# Patient Record
Sex: Male | Born: 1945 | Race: White | Hispanic: No | Marital: Married | Smoking: Former smoker
Health system: Southern US, Community
[De-identification: ages and names within clinical notes are randomized; demographics above are authoritative.]

## PROBLEM LIST (undated history)

## (undated) ENCOUNTER — Emergency Department (HOSPITAL_COMMUNITY): Discharge status: Absent without leave | Payer: PPO

## (undated) DIAGNOSIS — Z720 Tobacco use: Secondary | ICD-10-CM

## (undated) DIAGNOSIS — K219 Gastro-esophageal reflux disease without esophagitis: Secondary | ICD-10-CM

## (undated) DIAGNOSIS — I10 Essential (primary) hypertension: Secondary | ICD-10-CM

## (undated) DIAGNOSIS — F329 Major depressive disorder, single episode, unspecified: Secondary | ICD-10-CM

## (undated) DIAGNOSIS — J449 Chronic obstructive pulmonary disease, unspecified: Secondary | ICD-10-CM

## (undated) DIAGNOSIS — F32A Depression, unspecified: Secondary | ICD-10-CM

## (undated) DIAGNOSIS — G8929 Other chronic pain: Secondary | ICD-10-CM

## (undated) DIAGNOSIS — F209 Schizophrenia, unspecified: Secondary | ICD-10-CM

## (undated) DIAGNOSIS — G4730 Sleep apnea, unspecified: Secondary | ICD-10-CM

## (undated) DIAGNOSIS — I480 Paroxysmal atrial fibrillation: Secondary | ICD-10-CM

## (undated) DIAGNOSIS — E119 Type 2 diabetes mellitus without complications: Secondary | ICD-10-CM

## (undated) DIAGNOSIS — I509 Heart failure, unspecified: Secondary | ICD-10-CM

## (undated) DIAGNOSIS — R0902 Hypoxemia: Secondary | ICD-10-CM

## (undated) DIAGNOSIS — I639 Cerebral infarction, unspecified: Secondary | ICD-10-CM

## (undated) DIAGNOSIS — M1990 Unspecified osteoarthritis, unspecified site: Secondary | ICD-10-CM

## (undated) DIAGNOSIS — Z9981 Dependence on supplemental oxygen: Secondary | ICD-10-CM

## (undated) DIAGNOSIS — E7800 Pure hypercholesterolemia, unspecified: Secondary | ICD-10-CM

## (undated) DIAGNOSIS — H5440 Blindness, one eye, unspecified eye: Secondary | ICD-10-CM

## (undated) HISTORY — PX: KNEE SURGERY: SHX244

## (undated) HISTORY — DX: Unspecified osteoarthritis, unspecified site: M19.90

## (undated) HISTORY — DX: Hypoxemia: R09.02

---

## 1998-06-08 ENCOUNTER — Inpatient Hospital Stay (HOSPITAL_COMMUNITY): Admission: EM | Admit: 1998-06-08 | Discharge: 1998-06-10 | Payer: Self-pay | Admitting: Emergency Medicine

## 1998-06-13 ENCOUNTER — Encounter (HOSPITAL_COMMUNITY): Admission: RE | Admit: 1998-06-13 | Discharge: 1998-09-11 | Payer: Self-pay

## 1998-12-29 ENCOUNTER — Emergency Department (HOSPITAL_COMMUNITY): Admission: EM | Admit: 1998-12-29 | Discharge: 1998-12-29 | Payer: Self-pay | Admitting: *Deleted

## 1999-01-27 ENCOUNTER — Encounter: Payer: Self-pay | Admitting: General Surgery

## 1999-01-27 ENCOUNTER — Inpatient Hospital Stay (HOSPITAL_COMMUNITY): Admission: EM | Admit: 1999-01-27 | Discharge: 1999-02-10 | Payer: Self-pay | Admitting: *Deleted

## 1999-01-28 ENCOUNTER — Encounter: Payer: Self-pay | Admitting: General Surgery

## 1999-01-29 ENCOUNTER — Encounter: Payer: Self-pay | Admitting: General Surgery

## 1999-01-31 ENCOUNTER — Encounter: Payer: Self-pay | Admitting: Surgery

## 1999-02-01 ENCOUNTER — Encounter: Payer: Self-pay | Admitting: General Surgery

## 1999-02-01 ENCOUNTER — Encounter: Payer: Self-pay | Admitting: Surgery

## 1999-02-02 ENCOUNTER — Encounter: Payer: Self-pay | Admitting: General Surgery

## 1999-02-05 ENCOUNTER — Encounter: Payer: Self-pay | Admitting: General Surgery

## 1999-02-07 ENCOUNTER — Encounter: Payer: Self-pay | Admitting: Internal Medicine

## 1999-02-07 ENCOUNTER — Encounter (HOSPITAL_BASED_OUTPATIENT_CLINIC_OR_DEPARTMENT_OTHER): Payer: Self-pay | Admitting: Internal Medicine

## 1999-02-09 ENCOUNTER — Encounter: Payer: Self-pay | Admitting: General Surgery

## 2000-11-22 ENCOUNTER — Emergency Department (HOSPITAL_COMMUNITY): Admission: EM | Admit: 2000-11-22 | Discharge: 2000-11-22 | Payer: Self-pay | Admitting: Internal Medicine

## 2005-09-17 ENCOUNTER — Emergency Department (HOSPITAL_COMMUNITY): Admission: EM | Admit: 2005-09-17 | Discharge: 2005-09-17 | Payer: Self-pay | Admitting: Emergency Medicine

## 2005-09-19 ENCOUNTER — Ambulatory Visit: Payer: Self-pay | Admitting: Orthopedic Surgery

## 2005-09-24 ENCOUNTER — Ambulatory Visit (HOSPITAL_COMMUNITY): Admission: RE | Admit: 2005-09-24 | Discharge: 2005-09-24 | Payer: Self-pay | Admitting: Orthopedic Surgery

## 2005-10-11 ENCOUNTER — Encounter: Payer: Self-pay | Admitting: Pulmonary Disease

## 2005-10-25 ENCOUNTER — Encounter: Payer: Self-pay | Admitting: Pulmonary Disease

## 2005-10-25 ENCOUNTER — Ambulatory Visit: Admission: RE | Admit: 2005-10-25 | Discharge: 2005-10-25 | Payer: Self-pay | Admitting: Internal Medicine

## 2005-11-12 ENCOUNTER — Ambulatory Visit: Payer: Self-pay | Admitting: Pulmonary Disease

## 2005-12-28 ENCOUNTER — Encounter (HOSPITAL_COMMUNITY): Admission: RE | Admit: 2005-12-28 | Discharge: 2006-01-27 | Payer: Self-pay | Admitting: Orthopaedic Surgery

## 2006-01-28 ENCOUNTER — Encounter (HOSPITAL_COMMUNITY): Admission: RE | Admit: 2006-01-28 | Discharge: 2006-02-27 | Payer: Self-pay | Admitting: Orthopaedic Surgery

## 2006-03-01 ENCOUNTER — Encounter (HOSPITAL_COMMUNITY): Admission: RE | Admit: 2006-03-01 | Discharge: 2006-03-31 | Payer: Self-pay | Admitting: Orthopaedic Surgery

## 2006-03-05 ENCOUNTER — Ambulatory Visit (HOSPITAL_COMMUNITY): Admission: RE | Admit: 2006-03-05 | Discharge: 2006-03-05 | Payer: Self-pay | Admitting: Orthopaedic Surgery

## 2006-03-26 ENCOUNTER — Ambulatory Visit (HOSPITAL_BASED_OUTPATIENT_CLINIC_OR_DEPARTMENT_OTHER): Admission: RE | Admit: 2006-03-26 | Discharge: 2006-03-26 | Payer: Self-pay | Admitting: Orthopaedic Surgery

## 2006-05-07 ENCOUNTER — Ambulatory Visit (HOSPITAL_BASED_OUTPATIENT_CLINIC_OR_DEPARTMENT_OTHER): Admission: RE | Admit: 2006-05-07 | Discharge: 2006-05-07 | Payer: Self-pay | Admitting: Orthopaedic Surgery

## 2006-06-12 ENCOUNTER — Encounter (HOSPITAL_COMMUNITY): Admission: RE | Admit: 2006-06-12 | Discharge: 2006-06-15 | Payer: Self-pay | Admitting: Orthopaedic Surgery

## 2006-06-17 ENCOUNTER — Encounter (HOSPITAL_COMMUNITY): Admission: RE | Admit: 2006-06-17 | Discharge: 2006-07-17 | Payer: Self-pay | Admitting: Orthopaedic Surgery

## 2006-07-18 ENCOUNTER — Encounter (HOSPITAL_COMMUNITY): Admission: RE | Admit: 2006-07-18 | Discharge: 2006-08-17 | Payer: Self-pay | Admitting: Orthopaedic Surgery

## 2006-08-22 ENCOUNTER — Encounter (HOSPITAL_COMMUNITY): Admission: RE | Admit: 2006-08-22 | Discharge: 2006-09-16 | Payer: Self-pay | Admitting: Orthopaedic Surgery

## 2006-09-18 ENCOUNTER — Encounter (HOSPITAL_COMMUNITY): Admission: RE | Admit: 2006-09-18 | Discharge: 2006-10-18 | Payer: Self-pay | Admitting: Orthopaedic Surgery

## 2008-08-25 DIAGNOSIS — I10 Essential (primary) hypertension: Secondary | ICD-10-CM | POA: Insufficient documentation

## 2008-08-25 DIAGNOSIS — G4733 Obstructive sleep apnea (adult) (pediatric): Secondary | ICD-10-CM

## 2008-08-25 DIAGNOSIS — E785 Hyperlipidemia, unspecified: Secondary | ICD-10-CM | POA: Insufficient documentation

## 2008-08-26 ENCOUNTER — Ambulatory Visit: Payer: Self-pay | Admitting: Pulmonary Disease

## 2008-08-26 DIAGNOSIS — R0600 Dyspnea, unspecified: Secondary | ICD-10-CM | POA: Insufficient documentation

## 2008-09-21 ENCOUNTER — Ambulatory Visit: Admission: RE | Admit: 2008-09-21 | Discharge: 2008-09-21 | Payer: Self-pay | Admitting: Surgery

## 2008-09-21 ENCOUNTER — Encounter: Payer: Self-pay | Admitting: Pulmonary Disease

## 2008-11-15 HISTORY — PX: COLON SURGERY: SHX602

## 2008-12-13 ENCOUNTER — Encounter (INDEPENDENT_AMBULATORY_CARE_PROVIDER_SITE_OTHER): Payer: Self-pay | Admitting: Surgery

## 2008-12-13 ENCOUNTER — Inpatient Hospital Stay (HOSPITAL_COMMUNITY): Admission: RE | Admit: 2008-12-13 | Discharge: 2008-12-19 | Payer: Self-pay | Admitting: Surgery

## 2010-03-17 HISTORY — PX: COLONOSCOPY: SHX174

## 2010-03-24 ENCOUNTER — Ambulatory Visit (HOSPITAL_COMMUNITY): Admission: RE | Admit: 2010-03-24 | Discharge: 2010-03-24 | Payer: Self-pay | Admitting: Gastroenterology

## 2010-12-27 LAB — DIFFERENTIAL
Eosinophils Absolute: 0.1 10*3/uL (ref 0.0–0.7)
Eosinophils Relative: 1 % (ref 0–5)
Lymphs Abs: 1.3 10*3/uL (ref 0.7–4.0)
Monocytes Relative: 8 % (ref 3–12)

## 2010-12-27 LAB — GLUCOSE, CAPILLARY
Glucose-Capillary: 101 mg/dL — ABNORMAL HIGH (ref 70–99)
Glucose-Capillary: 113 mg/dL — ABNORMAL HIGH (ref 70–99)
Glucose-Capillary: 117 mg/dL — ABNORMAL HIGH (ref 70–99)
Glucose-Capillary: 136 mg/dL — ABNORMAL HIGH (ref 70–99)
Glucose-Capillary: 152 mg/dL — ABNORMAL HIGH (ref 70–99)
Glucose-Capillary: 164 mg/dL — ABNORMAL HIGH (ref 70–99)
Glucose-Capillary: 184 mg/dL — ABNORMAL HIGH (ref 70–99)
Glucose-Capillary: 71 mg/dL (ref 70–99)
Glucose-Capillary: 75 mg/dL (ref 70–99)
Glucose-Capillary: 81 mg/dL (ref 70–99)

## 2010-12-27 LAB — CBC
HCT: 45.4 % (ref 39.0–52.0)
MCV: 92.2 fL (ref 78.0–100.0)
Platelets: 147 10*3/uL — ABNORMAL LOW (ref 150–400)
RBC: 4.93 MIL/uL (ref 4.22–5.81)
WBC: 13.3 10*3/uL — ABNORMAL HIGH (ref 4.0–10.5)

## 2010-12-28 LAB — CBC
HCT: 44.8 % (ref 39.0–52.0)
HCT: 47.7 % (ref 39.0–52.0)
Hemoglobin: 15.3 g/dL (ref 13.0–17.0)
MCV: 93.5 fL (ref 78.0–100.0)
MCV: 93.9 fL (ref 78.0–100.0)
Platelets: 140 10*3/uL — ABNORMAL LOW (ref 150–400)
RDW: 13.4 % (ref 11.5–15.5)
RDW: 13.6 % (ref 11.5–15.5)
RDW: 13.7 % (ref 11.5–15.5)
WBC: 10.4 10*3/uL (ref 4.0–10.5)
WBC: 11.9 10*3/uL — ABNORMAL HIGH (ref 4.0–10.5)

## 2010-12-28 LAB — COMPREHENSIVE METABOLIC PANEL
ALT: 25 U/L (ref 0–53)
Albumin: 4 g/dL (ref 3.5–5.2)
Alkaline Phosphatase: 63 U/L (ref 39–117)
BUN: 9 mg/dL (ref 6–23)
Chloride: 100 mEq/L (ref 96–112)
Glucose, Bld: 94 mg/dL (ref 70–99)
Potassium: 4.8 mEq/L (ref 3.5–5.1)
Sodium: 139 mEq/L (ref 135–145)
Total Bilirubin: 0.5 mg/dL (ref 0.3–1.2)
Total Protein: 7 g/dL (ref 6.0–8.3)

## 2010-12-28 LAB — GLUCOSE, CAPILLARY
Glucose-Capillary: 122 mg/dL — ABNORMAL HIGH (ref 70–99)
Glucose-Capillary: 129 mg/dL — ABNORMAL HIGH (ref 70–99)
Glucose-Capillary: 131 mg/dL — ABNORMAL HIGH (ref 70–99)
Glucose-Capillary: 143 mg/dL — ABNORMAL HIGH (ref 70–99)
Glucose-Capillary: 145 mg/dL — ABNORMAL HIGH (ref 70–99)
Glucose-Capillary: 150 mg/dL — ABNORMAL HIGH (ref 70–99)
Glucose-Capillary: 160 mg/dL — ABNORMAL HIGH (ref 70–99)
Glucose-Capillary: 162 mg/dL — ABNORMAL HIGH (ref 70–99)
Glucose-Capillary: 168 mg/dL — ABNORMAL HIGH (ref 70–99)
Glucose-Capillary: 172 mg/dL — ABNORMAL HIGH (ref 70–99)

## 2010-12-28 LAB — DIFFERENTIAL
Basophils Absolute: 0 10*3/uL (ref 0.0–0.1)
Basophils Absolute: 0 10*3/uL (ref 0.0–0.1)
Basophils Absolute: 0.1 10*3/uL (ref 0.0–0.1)
Basophils Relative: 0 % (ref 0–1)
Eosinophils Absolute: 0 10*3/uL (ref 0.0–0.7)
Eosinophils Absolute: 0 10*3/uL (ref 0.0–0.7)
Eosinophils Absolute: 0.2 10*3/uL (ref 0.0–0.7)
Lymphocytes Relative: 8 % — ABNORMAL LOW (ref 12–46)
Lymphs Abs: 0.8 10*3/uL (ref 0.7–4.0)
Lymphs Abs: 0.9 10*3/uL (ref 0.7–4.0)
Monocytes Absolute: 0.5 10*3/uL (ref 0.1–1.0)
Monocytes Absolute: 0.8 10*3/uL (ref 0.1–1.0)
Monocytes Relative: 5 % (ref 3–12)
Neutrophils Relative %: 78 % — ABNORMAL HIGH (ref 43–77)
Neutrophils Relative %: 84 % — ABNORMAL HIGH (ref 43–77)

## 2010-12-28 LAB — BLOOD GAS, ARTERIAL
Acid-Base Excess: 4.6 mmol/L — ABNORMAL HIGH (ref 0.0–2.0)
Bicarbonate: 29.8 mEq/L — ABNORMAL HIGH (ref 20.0–24.0)
O2 Saturation: 92.4 %
Patient temperature: 98.6
TCO2: 25.8 mmol/L (ref 0–100)

## 2010-12-28 LAB — BASIC METABOLIC PANEL
BUN: 2 mg/dL — ABNORMAL LOW (ref 6–23)
BUN: 4 mg/dL — ABNORMAL LOW (ref 6–23)
GFR calc non Af Amer: >60 mL/min (ref >60)
GFR calc non Af Amer: >60 mL/min (ref >60)
Glucose, Bld: 158 mg/dL — ABNORMAL HIGH (ref 70–99)
Glucose, Bld: 185 mg/dL — ABNORMAL HIGH (ref 70–99)
Potassium: 5.1 mEq/L (ref 3.5–5.1)

## 2011-01-01 LAB — BLOOD GAS, ARTERIAL
Acid-Base Excess: 2.4 mmol/L — ABNORMAL HIGH (ref 0.0–2.0)
Bicarbonate: 27.7 mEq/L — ABNORMAL HIGH (ref 20.0–24.0)
O2 Saturation: 89.5 %
Patient temperature: 98.6
TCO2: 23.9 mmol/L (ref 0–100)
pO2, Arterial: 55.4 mmHg — ABNORMAL LOW (ref 80.0–100.0)

## 2011-01-01 LAB — DIFFERENTIAL
Basophils Relative: 0 % (ref 0–1)
Eosinophils Absolute: 0.1 10*3/uL (ref 0.0–0.7)
Lymphs Abs: 1.7 10*3/uL (ref 0.7–4.0)
Monocytes Relative: 5 % (ref 3–12)
Neutro Abs: 9.8 10*3/uL — ABNORMAL HIGH (ref 1.7–7.7)
Neutrophils Relative %: 80 % — ABNORMAL HIGH (ref 43–77)

## 2011-01-01 LAB — COMPREHENSIVE METABOLIC PANEL
ALT: 28 U/L (ref 0–53)
BUN: 12 mg/dL (ref 6–23)
CO2: 27 mEq/L (ref 19–32)
Calcium: 8.8 mg/dL (ref 8.4–10.5)
GFR calc non Af Amer: >60 mL/min (ref >60)
Glucose, Bld: 444 mg/dL — ABNORMAL HIGH (ref 70–99)
Total Protein: 6.6 g/dL (ref 6.0–8.3)

## 2011-01-01 LAB — CBC
HCT: 49.1 % (ref 39.0–52.0)
Hemoglobin: 16.2 g/dL (ref 13.0–17.0)
MCHC: 32.9 g/dL (ref 30.0–36.0)
MCV: 89.7 fL (ref 78.0–100.0)
RBC: 5.47 MIL/uL (ref 4.22–5.81)
RDW: 12.6 % (ref 11.5–15.5)

## 2011-01-30 NOTE — Op Note (Signed)
NAMEBYRANT, CHAIRS                ACCOUNT NO.:  [id number]   MEDICAL RECORD NO.:  [id number]          PATIENT TYPE:  INP   LOCATION:  0003                         FACILITY:  [facility]   PHYSICIAN:  Fenton Malling. Lucia Gaskins, M.D.  DATE OF BIRTH:  [birth year]   DATE OF PROCEDURE:  12/13/2008  DATE OF DISCHARGE:                               OPERATIVE REPORT   Date of Surgery ?   PREOPERATIVE DIAGNOSIS:  Right colon polyp.   POSTOPERATIVE DIAGNOSIS:  Right colon polyp approximately 1 x 3 cm in  size.   PROCEDURE:  Lap assisted right hemicolectomy.   SURGEON:  Fenton Malling. Lucia Gaskins, M.D.   FIRST ASSISTANT:  Orson Ape. Rise Patience, M.D.   ANESTHESIA:  General endotracheal.   ESTIMATED BLOOD LOSS:  Less than 150 mL.   INDICATIONS FOR PROCEDURE:  Mr. Svay is a 65 year old white male who is  a patient of Dr. [name].  He underwent a colonoscopy by Dr. Jim Desanctis in October 2009 was found to have a right colon polyp which Dr. Lajoyce Corners  was able to remove.   I discussed with him about proceeding with surgery for resection of this  polyp.  He sees Dr. Danton Sewer from a pulmonary standpoint. His  original surgery was originally delayed until he was placed on insulin  for controlling his diabetes mellitus.   The indications, potential complications of right hemicolectomy were  explained to the patient.  Potential complications include, but not  limited to, bleeding, infection, leakage from the bowel and the  possibility this could be malignant (though by Dr. Shan Levans evaluation,  this appears to be a benign polyp0.   OPERATIVE NOTE:  The patient was placed in supine position, given a  general endotracheal anesthetic.  His right side was marked.  He was  prepped with Betadine solution and sterilely draped.  He was given 1 g  of cefoxitin at initiation of his procedure.  A time-out was held  identifying the patient and procedure.   I accessed the abdominal cavity to the left upper quadrant with a 10-mm  OptiVu trocar.  I placed 4 additional 5 mm trocars, one left lower  quadrant, one right lower quadrant, one sort of right upper midline and  one to the left of his umbilicus.  I was able to identify the right  colon.  I grabbed the appendix, took down the mesentery of the appendix.  With a LigaSure I was able to mobilize the colon by dividing its lateral  attachments and mobilizing the colon towards the midline.  I made the  turn at the hepatic flexure, identified the gallbladder, identified the  duodenum and stayed well away from these during the dissection.  I was  able to mobilize his colon well enough to a point that I thought I could  do the surgery open.   I was able to identify an area in his right upper abdomen and I made a  transverse incision and got into the abdominal cavity.  I was able to  grab his terminal ileum and go up to his right transverse  colon.  I took  down the mesentery, used primarily 2-0 silk ties and then did place a  firing of the GIA 75 stapler connecting the terminal ileum to the  hepatic flexure, right transverse colon and then I teed this off with a  TA 60 stapler.  I cut the specimen and examined it.  I opened this as  the bedside.  She did have approximately a 1 x 3 benign appearing polyp  immediately proximal to the ileocecal valve.  I saw no polyp or growth  with the specimen.   This was sent to pathology.   I then reinspected the anastomosis.  I tried to close the crotch of the  staple line with two 2-0 silk sutures.  I oversewed the TA 60 staple  line with an interrupted figure-of-eight 2-0 silk suture.  I tried to  close the mesentery with 2-0 silk sutures.  I then irrigated the abdomen  with 2 L of saline.  I then closed the posterior fascia with a running 2  running #1 PDS sutures.  I infiltrated about 30 mL of 0.25% Marcaine in  the rectus abdominis muscles and subcutaneous tissues.  I then closed  the anterior rectus fascia with two running #1  PDS sutures.  I irrigated  the subcutaneous tissues, closed the skin was staples.   The patient tolerated the procedure well.  Because this was the right  colon, I removed the orogastric tube.  I will leave a Foley in overnight  which I plan to get out tomorrow morning.  He otherwise did well from a  cardiac standpoint.   His sponge and needle counts were correct at the end of the case.  He  was transferred to recovery room in good condition.  Because of his  multiple medical problems, I will place him in the step down ICU.      Fenton Malling. Lucia Gaskins, M.D.  Electronically Signed     DHN/MEDQ  D:  12/13/2008  T:  12/13/2008  Job:  [id number]   cc:   Kathee Delton, MD,FCCP  [address]  [place]  [place] [address]   Arlyss Repress, MD  Fax: [phone]   Waverly Ferrari, M.D.  Fax: [phone]

## 2011-02-02 NOTE — Discharge Summary (Signed)
Rick Cruz, Rick Cruz                ACCOUNT NO.:  [id number]   MEDICAL RECORD NO.:  [id number]          PATIENT TYPE:  INP   LOCATION:  [place]                         FACILITY:  [facility]   PHYSICIAN:  Fenton Malling. Lucia Gaskins, M.D.  DATE OF BIRTH:  1946-03-28   DATE OF ADMISSION:  12/13/2008  DATE OF DISCHARGE:  12/19/2008                               DISCHARGE SUMMARY   Dates of admission - 13 December 2008  Date of discharge - 19 December 2008   DISCHARGE DIAGNOSES:  1. 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  2. Insulin dependent diabetes mellitus.  3. History of cigarette smoking.  4. Morbid obesity.  5. Hypertension.  6. Hypercholesterolemia.  7. Chronic obstructive pulmonary disease.  8. Obstructive sleep apnea.   OPERATION PERFORMED:  The patient had a laparoscopic assisted right  hemicolectomy on December 13, 2008.   HISTORY OF PRESENT ILLNESS:  Rick Cruz is a 65 year old white male  patient of Dr. [name] who had a colonoscopy by Dr. Jim Desanctis on  June 30, 2008.  He was found to have a large right colon/cecal polyp  which was not amenable to colonoscopic resection and has seen me for  resection of this.   The patient completed a mechanical bowel prep at home and came to [facility]  for his colon resection.   On December 13, 2008, the patient underwent a laparoscopic assisted right  hemicolectomy.   Postoperatively he did well.  I did put him in the ICU for a couple of  night because he has diabetes and obstructive sleep apnea.  He also had  some mild confusion postop.   However, by the fourth postoperative day he was getting slowly better.  His hemoglobin was 14, hematocrit was 45, white blood count 13,800.  He  was started on clear liquids with his diet advanced.  His diabetes was  stable.  He seemed to be tolerating CPAP well.   I opened his wound which had a seroma.   On April 4 which was his sixth postoperative day he was ready for  discharge.   DISCHARGE  INSTRUCTIONS: He was told to resume his home medications.  He was given Vicodin for pain.  He was to do local wound changes twice a  day.  He will see me back in the office in 1 week's time for followup care and  evaluation.  His final pathology read by Dr. Rodney Cruise showed right colon terminal ileum  with a sessile tubovillous adenoma which had no evidence of malignancy  or invasion.  His discharge condition was good.      Fenton Malling. Lucia Gaskins, M.D.  Electronically Signed     DHN/MEDQ  D:  01/05/2009  T:  01/05/2009  Job:  [id number]   cc:   Waverly Ferrari, M.D.  Fax: [place], MD,FCCP  [address] [address]   James Y [name], MD  Fax: [phone]

## 2011-02-02 NOTE — Op Note (Signed)
NAMESHERWIN, ECKRICH                ACCOUNT NO.:  [id number]   MEDICAL RECORD NO.:  [id number]          PATIENT TYPE:  AMB   LOCATION:  DSC                          FACILITY:  [facility]   PHYSICIAN:  Monico Blitz. Dalldorf, M.D.DATE OF BIRTH:  Dec 16, 1945   DATE OF PROCEDURE:  05/07/2006  DATE OF DISCHARGE:                                 OPERATIVE REPORT   PREOPERATIVE DIAGNOSES:  Left quadriceps repeat rupture.   POSTOPERATIVE DIAGNOSES:  Left quadriceps repeat rupture.   PROCEDURE:  Left quadriceps repeat repair.   ANESTHESIA:  General.   ATTENDING SURGEON:  Dr. Melrose Nakayama.   ASSISTANT:  Roselee Nova, P.A.   INDICATIONS FOR PROCEDURE:  The patient is a 65 year old man who ruptured  his quadriceps tendon about 9 months ago.  He underwent a delayed repair  about 5 months ago and initially did very well.  At about the 31-month mark  in therapy, he noticed some increased pain and at office follow-up  subsequently he was noted to have a defect and had lost his ability to  extend the leg completely.  About a month later, we got a repeat MRI scan  which showed rerupture with retraction of the quadriceps tendon.  At this  point, he is offered one more chance at a repeat repair.  Our plan is to go  ahead and repair this in a similar fashion but to augment with a Restore  patch.  This is in hopes of reestablishing the continuity of his extensor  mechanism.  Informed operative consent was obtained after discussion of the  possible complications of reaction to anesthesia, infection, DVT, knee  stiffness, and obviously rupture.   SUMMARY OF FINDINGS AND PROCEDURE:  Under general anesthesia through his old  incision, a repeat rupture of the quadriceps tendon was visualized.  The  defect had filled in with some very thin scar tissue but there was certainly  no continuity to his extensor mechanism.  He had significant adhesions all  across the quadriceps and patellar tendon.  These were all  lysed and things  were freed up.  We then repaired this with four limbs of Bunnell suture  which were passed through the quadriceps tendon and then through  longitudinal drill holes in the patella.  I then oversewed the retinacular  layers on either aspect of this repair with #1 Vicryl in interrupted  fashion.  We augmented the repair with a Restore patch by DePuy which was  sutured in place along the line of the repair over the superior pole of the  patella.  Chauncey Cruel assisted throughout and was invaluable to the  completion of this case in that he retracted and helped position so I could  perform the repair.   DESCRIPTION OF PROCEDURE:  The patient was taken to the operating suite  where general anesthetic was applied without difficulty.  He was positioned  supine and prepped and draped in the normal sterile fashion.  After the  administration of IV Kefzol, the left leg was elevated, exsanguinated, and a  tourniquet inflated about the thigh.  His old longitudinal  incision was made  with dissection down to the quadriceps and extensor mechanism.  As mentioned  above, he had a complete repeat rupture with some interposed scar tissue.  All this devitalized scar tissue was excised to viable tissues.  The  superior pole of the patella was completely cleaned of scar tissue and a  small trough was made in the bone with a rongeur.  I then used a #2  FiberWire and passed this in Lipan fashion through the quadriceps tendon  proximally with two limbs exiting distally.  I did this with a second #2  FiberWire as well.  We then made three longitudinal drill holes in the  patella and passed the aforementioned sutures through these drill holes.  We  then tied these sutures on the inferior pole of the patella and were able to  secure the central portion the quadriceps tendon to the proximal pole of the  patella in this fashion.  I then performed retinacular repairs on both sides  excising some  devitalized tissue once again prior to the repair which was  done with #1 Vicryl in interrupted fashion.  I then took a Restore patch and  soaked it for 15 or 20 minutes in saline as directed and then applied this  with some mild tension over the repair site.  This was secured to the  underlying tissues with 2-0 Vicryl in interrupted fashion.  The tourniquet  was deflated and a small amount of bleeding was easily controlled with Bovie  cautery.  We then reapproximated the deep tissues with #0 and 2-0 undyed  Vicryl and skin was closed with staples.  Some Marcaine was injected about  the incision site.  Adaptic was applied followed by a dry gauze and a loose  Ace wrap with a knee immobilizer with the knee in full extension.  Estimated  blood loss and intraoperative fluids can be obtained from anesthesia records  as can accurate tourniquet time.   DISPOSITION:  The patient was extubated in the operating room and taken to  the recovery room in stable condition. Plans were for him to go home the  same-day and to followup in the office in less than one week .  I will  contact him by phone tonight.      Monico Blitz Rhona Raider, M.D.  Electronically Signed     PGD/MEDQ  D:  05/07/2006  T:  05/08/2006  Job:  [id number]

## 2011-11-13 ENCOUNTER — Ambulatory Visit: Payer: Medicare Other | Attending: Internal Medicine | Admitting: Sleep Medicine

## 2011-11-13 DIAGNOSIS — G4700 Insomnia, unspecified: Secondary | ICD-10-CM

## 2011-11-13 DIAGNOSIS — G4733 Obstructive sleep apnea (adult) (pediatric): Secondary | ICD-10-CM | POA: Insufficient documentation

## 2011-11-14 NOTE — Progress Notes (Signed)
Pt stated he self administered at 2135: Vicodin 5-500 mg two tablets and two 150 mg tablets of Trazadone

## 2011-11-19 NOTE — Procedures (Signed)
Rick Cruz, Rick Cruz                ACCOUNT NO.:  [id number]  MEDICAL RECORD NO.:  [id number]          PATIENT TYPE:  OUT  LOCATION:  SLEEP LAB                     FACILITY:  [facility]  PHYSICIAN:  Infant Zink A. Merlene Laughter, M.D. DATE OF BIRTH:  Jan 24, 1946  DATE OF STUDY:  11/13/2011                           NOCTURNAL POLYSOMNOGRAM  REFERRING PHYSICIAN:  Arlyss Repress, MD  INDICATIONS:  A 66 year old man who has a known diagnosis of obstructive sleep apnea syndrome.  From a sleep study done in 2005, he presents with recurrent symptoms of fatigue and snoring.  MEDICATIONS:  Abilify, Lasix, simvastatin, aspirin, quinapril, glipizide, metformin, potassium, trazodone, Vicodin, metoprolol, fish oil, multivitamin, insulin, Combivent.  Epworth Sleepiness Scale is 6.  BMI 38.  ARCHITECTURAL SUMMARY:  This is a full night CPAP for positive pressure titration recording.  The total recording time of 428 minutes.  Sleep efficiency 77%.  Sleep latency 21 minutes.  REM latency 135.5 minutes. Stage N1 8.2%, N2 58%, N3 1%, and REM sleep 33%.  Risk factors; baseline oxygen saturation was low at 84, lowest saturation went down to 75. Even while patient was on 4 L of oxygen during the REM sleep.  The patient was placed on positive pressure starting at 6 and various different maneuvers were tried.  Unfortunately, the patient's low baseline persisted.  He was titrated up to a CPAP of 10 with oxygen gradually added up to 4. He was also started on bilevel pressures between 12/6 to 21/14; again, however, he still had significant low baseline, still requiring nocturnal oxygen.  The desaturations were worse during the REM sleep.  Patient could do well with various pressures as long as he is on 4 L of oxygen.  LIMB MOVEMENT SUMMARY:  PLM index 0.  ELECTROCARDIOGRAM SUMMARY:  Average heart rate is 91, with no significant dysrhythmias observed.  IMPRESSION: 1. Obstructive sleep apnea syndrome, which responds well to  various     positive pressure treatment.  Did a little better on bilevel     pressures, but requiring nocturnal oxygen. 2. Hypoventilation syndrome.  RECOMMENDATIONS:  I would recommend a bilevel of 13/7 with 4 L of oxygen bled in.  I also suggest that the patient undergo a nocturnal oximetry about a month after he has been on treatment, and this oximetry should also be done while he is on treatment.   Kahlea Cobert A. Merlene Laughter, M.D.    KAD/MEDQ  D:  11/19/2011 08:45:54  T:  11/19/2011 09:12:36  Job:  [id number]

## 2013-04-18 ENCOUNTER — Emergency Department (HOSPITAL_COMMUNITY): Payer: Medicare Other

## 2013-04-18 ENCOUNTER — Encounter (HOSPITAL_COMMUNITY): Payer: Self-pay | Admitting: *Deleted

## 2013-04-18 ENCOUNTER — Inpatient Hospital Stay (HOSPITAL_COMMUNITY)
Admission: EM | Admit: 2013-04-18 | Discharge: 2013-04-23 | DRG: 189 | Disposition: A | Discharge status: Home Health | Payer: Medicare Other | Attending: Family Medicine | Admitting: Family Medicine

## 2013-04-18 DIAGNOSIS — F3289 Other specified depressive episodes: Secondary | ICD-10-CM | POA: Diagnosis present

## 2013-04-18 DIAGNOSIS — E7800 Pure hypercholesterolemia, unspecified: Secondary | ICD-10-CM | POA: Diagnosis present

## 2013-04-18 DIAGNOSIS — I509 Heart failure, unspecified: Secondary | ICD-10-CM | POA: Diagnosis present

## 2013-04-18 DIAGNOSIS — K2100 Gastro-esophageal reflux disease with esophagitis, without bleeding: Secondary | ICD-10-CM | POA: Diagnosis present

## 2013-04-18 DIAGNOSIS — Z9981 Dependence on supplemental oxygen: Secondary | ICD-10-CM

## 2013-04-18 DIAGNOSIS — E871 Hypo-osmolality and hyponatremia: Secondary | ICD-10-CM | POA: Diagnosis not present

## 2013-04-18 DIAGNOSIS — K208 Other esophagitis without bleeding: Secondary | ICD-10-CM | POA: Diagnosis present

## 2013-04-18 DIAGNOSIS — Z6841 Body Mass Index (BMI) 40.0 and over, adult: Secondary | ICD-10-CM

## 2013-04-18 DIAGNOSIS — G4733 Obstructive sleep apnea (adult) (pediatric): Secondary | ICD-10-CM

## 2013-04-18 DIAGNOSIS — Z91199 Patient's noncompliance with other medical treatment and regimen due to unspecified reason: Secondary | ICD-10-CM

## 2013-04-18 DIAGNOSIS — K922 Gastrointestinal hemorrhage, unspecified: Secondary | ICD-10-CM | POA: Diagnosis present

## 2013-04-18 DIAGNOSIS — Z79899 Other long term (current) drug therapy: Secondary | ICD-10-CM

## 2013-04-18 DIAGNOSIS — IMO0001 Reserved for inherently not codable concepts without codable children: Secondary | ICD-10-CM

## 2013-04-18 DIAGNOSIS — E785 Hyperlipidemia, unspecified: Secondary | ICD-10-CM

## 2013-04-18 DIAGNOSIS — E875 Hyperkalemia: Secondary | ICD-10-CM

## 2013-04-18 DIAGNOSIS — F200 Paranoid schizophrenia: Secondary | ICD-10-CM

## 2013-04-18 DIAGNOSIS — J9610 Chronic respiratory failure, unspecified whether with hypoxia or hypercapnia: Secondary | ICD-10-CM

## 2013-04-18 DIAGNOSIS — Z9119 Patient's noncompliance with other medical treatment and regimen: Secondary | ICD-10-CM

## 2013-04-18 DIAGNOSIS — I2510 Atherosclerotic heart disease of native coronary artery without angina pectoris: Secondary | ICD-10-CM

## 2013-04-18 DIAGNOSIS — F329 Major depressive disorder, single episode, unspecified: Secondary | ICD-10-CM | POA: Diagnosis present

## 2013-04-18 DIAGNOSIS — Z794 Long term (current) use of insulin: Secondary | ICD-10-CM

## 2013-04-18 DIAGNOSIS — J9620 Acute and chronic respiratory failure, unspecified whether with hypoxia or hypercapnia: Principal | ICD-10-CM | POA: Diagnosis present

## 2013-04-18 DIAGNOSIS — E119 Type 2 diabetes mellitus without complications: Secondary | ICD-10-CM

## 2013-04-18 DIAGNOSIS — J9621 Acute and chronic respiratory failure with hypoxia: Secondary | ICD-10-CM

## 2013-04-18 DIAGNOSIS — F17200 Nicotine dependence, unspecified, uncomplicated: Secondary | ICD-10-CM | POA: Diagnosis present

## 2013-04-18 DIAGNOSIS — I10 Essential (primary) hypertension: Secondary | ICD-10-CM

## 2013-04-18 DIAGNOSIS — K920 Hematemesis: Secondary | ICD-10-CM

## 2013-04-18 DIAGNOSIS — J441 Chronic obstructive pulmonary disease with (acute) exacerbation: Secondary | ICD-10-CM

## 2013-04-18 DIAGNOSIS — K2210 Ulcer of esophagus without bleeding: Secondary | ICD-10-CM

## 2013-04-18 HISTORY — DX: Chronic obstructive pulmonary disease, unspecified: J44.9

## 2013-04-18 HISTORY — DX: Sleep apnea, unspecified: G47.30

## 2013-04-18 HISTORY — DX: Major depressive disorder, single episode, unspecified: F32.9

## 2013-04-18 HISTORY — DX: Depression, unspecified: F32.A

## 2013-04-18 HISTORY — DX: Tobacco use: Z72.0

## 2013-04-18 HISTORY — DX: Schizophrenia, unspecified: F20.9

## 2013-04-18 HISTORY — DX: Type 2 diabetes mellitus without complications: E11.9

## 2013-04-18 HISTORY — DX: Dependence on supplemental oxygen: Z99.81

## 2013-04-18 HISTORY — DX: Heart failure, unspecified: I50.9

## 2013-04-18 HISTORY — DX: Pure hypercholesterolemia, unspecified: E78.00

## 2013-04-18 LAB — RAPID URINE DRUG SCREEN, HOSP PERFORMED
Amphetamines: NOT DETECTED
Cocaine: NOT DETECTED
Opiates: NOT DETECTED

## 2013-04-18 LAB — CBC WITH DIFFERENTIAL/PLATELET
Basophils Absolute: 0 10*3/uL (ref 0.0–0.1)
Basophils Relative: 0 % (ref 0–1)
HCT: 44.3 % (ref 39.0–52.0)
Lymphocytes Relative: 15 % (ref 12–46)
MCHC: 31.2 g/dL (ref 30.0–36.0)
Neutro Abs: 11.4 10*3/uL — ABNORMAL HIGH (ref 1.7–7.7)
Neutrophils Relative %: 77 % (ref 43–77)
Platelets: 187 10*3/uL (ref 150–400)
RDW: 12.2 % (ref 11.5–15.5)
WBC: 14.7 10*3/uL — ABNORMAL HIGH (ref 4.0–10.5)

## 2013-04-18 LAB — URINALYSIS, ROUTINE W REFLEX MICROSCOPIC
Bilirubin Urine: NEGATIVE
Hgb urine dipstick: NEGATIVE
Nitrite: NEGATIVE
Specific Gravity, Urine: 1.015 (ref 1.005–1.030)
Urobilinogen, UA: 0.2 mg/dL (ref 0.0–1.0)
pH: 6 (ref 5.0–8.0)

## 2013-04-18 LAB — COMPREHENSIVE METABOLIC PANEL
ALT: 21 U/L (ref 0–53)
AST: 14 U/L (ref 0–37)
Albumin: 3.6 g/dL (ref 3.5–5.2)
Alkaline Phosphatase: 83 U/L (ref 39–117)
Calcium: 9.5 mg/dL (ref 8.4–10.5)
Creatinine, Ser: 0.87 mg/dL (ref 0.50–1.35)
GFR calc Af Amer: >90 mL/min (ref >90)
Glucose, Bld: 194 mg/dL — ABNORMAL HIGH (ref 70–99)
Potassium: 4.5 mEq/L (ref 3.5–5.1)
Sodium: 133 mEq/L — ABNORMAL LOW (ref 135–145)
Total Protein: 7.1 g/dL (ref 6.0–8.3)

## 2013-04-18 LAB — PROTIME-INR: Prothrombin Time: 12.4 seconds (ref 11.6–15.2)

## 2013-04-18 LAB — TROPONIN I: Troponin I: <0.3 ng/mL (ref <0.30)

## 2013-04-18 LAB — PRO B NATRIURETIC PEPTIDE: Pro B Natriuretic peptide (BNP): 50.7 pg/mL (ref 0–125)

## 2013-04-18 LAB — SALICYLATE LEVEL: Salicylate Lvl: 0.1 mg/dL — ABNORMAL LOW (ref 2.8–20.0)

## 2013-04-18 MED ORDER — ALBUTEROL SULFATE (5 MG/ML) 0.5% IN NEBU
15.0000 mg | INHALATION_SOLUTION | Freq: Once | RESPIRATORY_TRACT | Status: AC
  Administered 2013-04-18: 15 mg via RESPIRATORY_TRACT
  Filled 2013-04-18: qty 0.5
  Filled 2013-04-18: qty 3

## 2013-04-18 MED ORDER — METHYLPREDNISOLONE SODIUM SUCC 125 MG IJ SOLR
125.0000 mg | Freq: Once | INTRAMUSCULAR | Status: AC
  Administered 2013-04-18: 125 mg via INTRAVENOUS
  Filled 2013-04-18: qty 2

## 2013-04-18 MED ORDER — IPRATROPIUM BROMIDE 0.02 % IN SOLN
1.0000 mg | Freq: Once | RESPIRATORY_TRACT | Status: AC
  Administered 2013-04-18: 1 mg via RESPIRATORY_TRACT
  Filled 2013-04-18: qty 2.5

## 2013-04-18 NOTE — ED Notes (Signed)
Pt has HX of COPD and DM2, exacerbated over last 2 weeks, EMS started IV, CBD 243, breathing treatment X1, lung sounds wet ULQ, Rick Cruz

## 2013-04-18 NOTE — ED Provider Notes (Signed)
CSN: [id number]     Arrival date & time 04/18/13  1938 History     First MD Initiated Contact with Patient 04/18/13 1941     Chief Complaint  Patient presents with  . Shortness of Breath  . Hyperglycemia    HPI Pt was seen at [facility].  Per pt and his wife, c/o gradual onset and worsening of persistent SOB and "wheezing" for the past 2 weeks. Pt has been using his home MDI without relief. Pt's wife states he continues to smoke, as well as not take his lasix regularly as prescribed. Pt's wife also states she believes he has been abusing his narcotic pain meds (vicodin) because "he ran out of the Vicodin earlier than he should have." Pt's wife states she believes he then has been taking "extra doses" of his seroquel "because he says it makes him relax and breathe better." Pt denies taking any other meds to excess, including APAP and ASA.  Denies SI or SA. EMS gave neb en route without relief. Denies CP/palpitations, no cough, no abd pain, no N/V/D, no back pain, no fevers.     Past Medical History  Diagnosis Date  . COPD (chronic obstructive pulmonary disease)   . Diabetes mellitus without complication   . CHF (congestive heart failure)   . Hypercholesterolemia   . Depression   . Schizophrenia   . Sleep apnea   . Tobacco abuse   . On home O2     Past Surgical History  Procedure Laterality Date  . Colon surgery      History  Substance Use Topics  . Smoking status: Current Every Day Smoker    Last Attempt to Quit: 04/16/2013  . Smokeless tobacco: Not on file  . Alcohol Use: No    Review of Systems ROS: Statement: All systems negative except as marked or noted in the HPI; Constitutional: Negative for fever and chills. ; ; Eyes: Negative for eye pain, redness and discharge. ; ; ENMT: Negative for ear pain, hoarseness, nasal congestion, sinus pressure and sore throat. ; ; Cardiovascular: +SOB. Negative for chest pain, palpitations, diaphoresis and peripheral edema. ; ; Respiratory:  +wheezing. Negative for cough and stridor. ; ; Gastrointestinal: Negative for nausea, vomiting, diarrhea, abdominal pain, blood in stool, hematemesis, jaundice and rectal bleeding. . ; ; Genitourinary: Negative for dysuria, flank pain and hematuria. ; ; Musculoskeletal: Negative for back pain and neck pain. Negative for swelling and trauma.; ; Skin: Negative for pruritus, rash, abrasions, blisters, bruising and skin lesion.; ; Neuro: Negative for headache, lightheadedness and neck stiffness. Negative for weakness, altered level of consciousness , altered mental status, extremity weakness, paresthesias, involuntary movement, seizure and syncope.     Allergies  Review of patient's allergies indicates no known allergies.  Home Medications   Current Outpatient Rx  Name  Route  Sig  Dispense  Refill  . aspirin EC 81 MG tablet   Oral   Take 81 mg by mouth daily.         . Cinnamon 500 MG capsule   Oral   Take 500 mg by mouth daily.         . clopidogrel (PLAVIX) 75 MG tablet   Oral   Take 75 mg by mouth daily.         . furosemide (LASIX) 40 MG tablet   Oral   Take 40 mg by mouth every other day. Patient takes Mon.,Wed.,Fri.,         . glipiZIDE (GLUCOTROL) 10  MG tablet   Oral   Take 10 mg by mouth 2 (two) times daily.         . insulin aspart (NOVOLOG FLEXPEN) 100 UNIT/ML SOPN FlexPen   Subcutaneous   Inject 2 Units into the skin 3 (three) times daily with meals.         . Insulin Glargine (LANTUS SOLOSTAR) 100 UNIT/ML SOPN   Subcutaneous   Inject 54 Units into the skin every evening.         . Liraglutide (VICTOZA) 18 MG/3ML SOPN   Subcutaneous   Inject 1.2 Units into the skin daily.         . metFORMIN (GLUCOPHAGE) 500 MG tablet   Oral   Take 500 mg by mouth every evening.         . metoprolol (LOPRESSOR) 50 MG tablet   Oral   Take 50 mg by mouth 2 (two) times daily.         . Omega-3 Fatty Acids (FISH OIL) 1000 MG CAPS   Oral   Take 1,000 mg by  mouth daily.         . potassium chloride SA (K-DUR,KLOR-CON) 20 MEQ tablet   Oral   Take 20 mEq by mouth every other day. Patient takes on Mon.,Wed.,Fri.         . quinapril (ACCUPRIL) 40 MG tablet   Oral   Take 40 mg by mouth 2 (two) times daily.         . simvastatin (ZOCOR) 20 MG tablet   Oral   Take 20 mg by mouth at bedtime.         . traZODone (DESYREL) 150 MG tablet   Oral   Take 150 mg by mouth at bedtime.         . ziprasidone (GEODON) 20 MG capsule   Oral   Take 20 mg by mouth daily.         . ziprasidone (GEODON) 80 MG capsule   Oral   Take 80 mg by mouth every evening.          BP 129/77  Pulse 80  Temp(Src) 98.4 F (36.9 C) (Oral)  Resp 24  Ht 6' (1.829 m)  Wt 290 lb (131.543 kg)  BMI 39.32 kg/m2  SpO2 96% Physical Exam 1955: Physical examination:  Nursing notes reviewed; Vital signs and O2 SAT reviewed;  Constitutional: Disheveled, unkempt. Well developed, Well nourished, In no acute distress; Head:  Normocephalic, atraumatic; Eyes: EOMI, PERRL, No scleral icterus; ENMT: Mouth and pharynx normal, Mucous membranes dry; Neck: Supple, Full range of motion, No lymphadenopathy; Cardiovascular: Regular rate and rhythm, No gallop; Respiratory: Breath sounds diminished & equal bilaterally, insp/exp wheezes bilat. Speaking full sentences, no retrax or access mm use. Normal respiratory effort/excursion; Chest: Nontender, Movement normal; Abdomen: Soft, Nontender, Nondistended, Normal bowel sounds; Genitourinary: No CVA tenderness; Extremities: Pulses normal, No tenderness, +1 pedal edema bilat without calf asymmetry.; Neuro: AA&Ox3, vague historian. Major CN grossly intact.  Speech clear. No gross focal motor or sensory deficits in extremities.; Skin: Color normal, Warm, Dry.   ED Course   2000:  Pt continues with wheezing on arrival to ED after EMS neb. Will start continuous/hour long neb and dose IV steroids.  2300:  Neb completed. Lungs continue  coarse, scattered exp wheezes, no audible wheezing. Sats remain 96% on pt's home O2 3L N/C. Dx and testing d/w pt and family.  Questions answered.  Verb understanding, agreeable to admit.  T/C to [facility]  Dr. Darrick Meigs, case discussed, including:  HPI, pertinent PM/SHx, VS/PE, dx testing, ED course and treatment:  Agreeable to observation admit, requests to write temporary orders, obtain medical bed to team 2.    Procedures     MDM  MDM Reviewed: previous chart, nursing note and vitals Reviewed previous: labs Interpretation: labs, ECG and x-ray Total time providing critical care: 30-74 minutes. This excludes time spent performing separately reportable procedures and services. Consults: admitting MD   CRITICAL CARE Performed by: Alfonzo Feller Total critical care time: 35 Critical care time was exclusive of separately billable procedures and treating other patients. Critical care was necessary to treat or prevent imminent or life-threatening deterioration. Critical care was time spent personally by me on the following activities: development of treatment plan with patient and/or surrogate as well as nursing, discussions with consultants, evaluation of patient's response to treatment, examination of patient, obtaining history from patient or surrogate, ordering and performing treatments and interventions, ordering and review of laboratory studies, ordering and review of radiographic studies, pulse oximetry and re-evaluation of patient's condition.    Date: 04/18/2013  Rate: 80  Rhythm: normal sinus rhythm  QRS Axis: normal  Intervals: normal  ST/T Wave abnormalities: normal  Conduction Disutrbances:none  Narrative Interpretation:   Old EKG Reviewed: none available.  Results for orders placed during the hospital encounter of 04/18/13  CBC WITH DIFFERENTIAL      Result Value Range   WBC 14.7 (*) 4.0 - 10.5 K/uL   RBC 4.78  4.22 - 5.81 MIL/uL   Hemoglobin 13.8  13.0 - 17.0 g/dL   HCT  44.3  39.0 - 52.0 %   MCV 92.7  78.0 - 100.0 fL   MCH 28.9  26.0 - 34.0 pg   MCHC 31.2  30.0 - 36.0 g/dL   RDW 12.2  11.5 - 15.5 %   Platelets 187  150 - 400 K/uL   Neutrophils Relative % 77  43 - 77 %   Neutro Abs 11.4 (*) 1.7 - 7.7 K/uL   Lymphocytes Relative 15  12 - 46 %   Lymphs Abs 2.2  0.7 - 4.0 K/uL   Monocytes Relative 7  3 - 12 %   Monocytes Absolute 1.0  0.1 - 1.0 K/uL   Eosinophils Relative 1  0 - 5 %   Eosinophils Absolute 0.2  0.0 - 0.7 K/uL   Basophils Relative 0  0 - 1 %   Basophils Absolute 0.0  0.0 - 0.1 K/uL  TROPONIN I      Result Value Range   Troponin I <0.30  <0.30 ng/mL  PRO B NATRIURETIC PEPTIDE      Result Value Range   Pro B Natriuretic peptide (BNP) 50.7  0 - 125 pg/mL  COMPREHENSIVE METABOLIC PANEL      Result Value Range   Sodium 133 (*) 135 - 145 mEq/L   Potassium 4.5  3.5 - 5.1 mEq/L   Chloride 92 (*) 96 - 112 mEq/L   CO2 36 (*) 19 - 32 mEq/L   Glucose, Bld 194 (*) 70 - 99 mg/dL   BUN 9  6 - 23 mg/dL   Creatinine, Ser 0.87  0.50 - 1.35 mg/dL   Calcium 9.5  8.4 - 10.5 mg/dL   Total Protein 7.1  6.0 - 8.3 g/dL   Albumin 3.6  3.5 - 5.2 g/dL   AST 14  0 - 37 U/L   ALT 21  0 - 53 U/L   Alkaline Phosphatase 83  39 - 117 U/L   Total Bilirubin 0.3  0.3 - 1.2 mg/dL   GFR calc non Af Amer 88 (*) >90 mL/min   GFR calc Af Amer >90  >90 mL/min  ACETAMINOPHEN LEVEL      Result Value Range   Acetaminophen (Tylenol), Serum <15.0  10 - 30 ug/mL  ETHANOL      Result Value Range   Alcohol, Ethyl (B) <11  0 - 11 mg/dL  SALICYLATE LEVEL      Result Value Range   Salicylate Lvl 0.1 (*) 2.8 - 20.0 mg/dL  URINE RAPID DRUG SCREEN (HOSP PERFORMED)      Result Value Range   Opiates NONE DETECTED  NONE DETECTED   Cocaine NONE DETECTED  NONE DETECTED   Benzodiazepines NONE DETECTED  NONE DETECTED   Amphetamines NONE DETECTED  NONE DETECTED   Tetrahydrocannabinol NONE DETECTED  NONE DETECTED   Barbiturates NONE DETECTED  NONE DETECTED  APTT      Result  Value Range   aPTT 26  24 - 37 seconds  PROTIME-INR      Result Value Range   Prothrombin Time 12.4  11.6 - 15.2 seconds   INR 0.94  0.00 - 1.49  URINALYSIS, ROUTINE W REFLEX MICROSCOPIC      Result Value Range   Color, Urine YELLOW  YELLOW   APPearance CLEAR  CLEAR   Specific Gravity, Urine 1.015  1.005 - 1.030   pH 6.0  5.0 - 8.0   Glucose, UA 500 (*) NEGATIVE mg/dL   Hgb urine dipstick NEGATIVE  NEGATIVE   Bilirubin Urine NEGATIVE  NEGATIVE   Ketones, ur NEGATIVE  NEGATIVE mg/dL   Protein, ur NEGATIVE  NEGATIVE mg/dL   Urobilinogen, UA 0.2  0.0 - 1.0 mg/dL   Nitrite NEGATIVE  NEGATIVE   Leukocytes, UA NEGATIVE  NEGATIVE   Dg Chest Port 1 View 04/18/2013   *RADIOLOGY REPORT*  Clinical Data: Shortness of breath  PORTABLE CHEST - 1 VIEW  Comparison: 08/26/2008  Findings: Chronic interstitial markings.  No focal consolidation or frank interstitial edema. No pleural effusion or pneumothorax.  Cardiomegaly.  IMPRESSION: No evidence of acute cardiopulmonary disease.  Chronic interstitial markings.   Original Report Authenticated By: Julian Hy, M.D.            Alfonzo Feller, DO 04/21/13 1241

## 2013-04-18 NOTE — H&P (Signed)
PCP:   [name], MD   Chief Complaint:  Shortness of breath  HPI: 67 year old male with a history of COPD, on home oxygen, diabetes mellitus, paranoid schizophrenia, obstructive sleep apnea who was brought to the hospital after patient has been having worsening shortness of breath. Patient is still smoking cigarettes, he smokes 1 pack per day. He has been coughing up phlegm. No fever. No nausea vomiting or diarrhea.  Allergies:  No Known Allergies    Past Medical History  Diagnosis Date  . COPD (chronic obstructive pulmonary disease)   . Diabetes mellitus without complication   . CHF (congestive heart failure)   . Hypercholesterolemia   . Depression   . Schizophrenia   . Sleep apnea   . Tobacco abuse   . On home O2     Past Surgical History  Procedure Laterality Date  . Colon surgery      Prior to Admission medications   Medication Sig Start Date End Date Taking? Authorizing Provider  aspirin EC 81 MG tablet Take 81 mg by mouth daily.   Yes Historical Provider, MD  Cinnamon 500 MG capsule Take 500 mg by mouth daily.   Yes Historical Provider, MD  clopidogrel (PLAVIX) 75 MG tablet Take 75 mg by mouth daily.   Yes Historical Provider, MD  furosemide (LASIX) 40 MG tablet Take 40 mg by mouth every other day. Patient takes Mon.,Wed.,Fri.,   Yes Historical Provider, MD  glipiZIDE (GLUCOTROL) 10 MG tablet Take 10 mg by mouth 2 (two) times daily.   Yes Historical Provider, MD  insulin aspart (NOVOLOG FLEXPEN) 100 UNIT/ML SOPN FlexPen Inject 2 Units into the skin 3 (three) times daily with meals.   Yes Historical Provider, MD  Insulin Glargine (LANTUS SOLOSTAR) 100 UNIT/ML SOPN Inject 54 Units into the skin every evening.   Yes Historical Provider, MD  Liraglutide (VICTOZA) 18 MG/3ML SOPN Inject 1.2 Units into the skin daily.   Yes Historical Provider, MD  metFORMIN (GLUCOPHAGE) 500 MG tablet Take 500 mg by mouth every evening.   Yes Historical Provider, MD  metoprolol (LOPRESSOR)  50 MG tablet Take 50 mg by mouth 2 (two) times daily.   Yes Historical Provider, MD  Omega-3 Fatty Acids (FISH OIL) 1000 MG CAPS Take 1,000 mg by mouth daily.   Yes Historical Provider, MD  potassium chloride SA (K-DUR,KLOR-CON) 20 MEQ tablet Take 20 mEq by mouth every other day. Patient takes on Mon.,Wed.,Fri.   Yes Historical Provider, MD  quinapril (ACCUPRIL) 40 MG tablet Take 40 mg by mouth 2 (two) times daily.   Yes Historical Provider, MD  simvastatin (ZOCOR) 20 MG tablet Take 20 mg by mouth at bedtime.   Yes Historical Provider, MD  traZODone (DESYREL) 150 MG tablet Take 150 mg by mouth at bedtime.   Yes Historical Provider, MD  ziprasidone (GEODON) 20 MG capsule Take 20 mg by mouth daily.   Yes Historical Provider, MD  ziprasidone (GEODON) 80 MG capsule Take 80 mg by mouth every evening.   Yes Historical Provider, MD    Social History:  reports that he has been smoking.  He does not have any smokeless tobacco history on file. He reports that he does not drink alcohol or use illicit drugs.  History reviewed. No pertinent family history.   All the positives are listed in BOLD  Review of Systems:  HEENT: Headache, blurred vision, runny nose, sore throat Neck: Hypothyroidism, hyperthyroidism,,lymphadenopathy Chest : Shortness of breath, history of COPD, Asthma Heart : Chest pain,  history of coronary arterey disease GI:  Nausea, vomiting, diarrhea, constipation, GERD GU: Dysuria, urgency, frequency of urination, hematuria Neuro: Stroke, seizures, syncope Psych: Depression, anxiety, hallucinations   Physical Exam: Blood pressure 125/69, pulse 86, temperature 98.4 F (36.9 C), temperature source Oral, resp. rate 16, height 6' (1.829 m), weight 131.543 kg (290 lb), SpO2 97.00%. Constitutional:   Patient is a morbidly obese male  in no acute distress and cooperative with exam. Head: Normocephalic and atraumatic Mouth: Mucus membranes moist Eyes: PERRL, EOMI, conjunctivae  normal Neck: Supple, No Thyromegaly Cardiovascular: RRR, S1 normal, S2 normal Pulmonary/Chest: Bilateral wheezing, no crackles auscultated Abdominal: Soft. Non-tender, non-distended, bowel sounds are normal, no masses, organomegaly, or guarding present.  Neurological: A&O x3, Strenght is normal and symmetric bilaterally, cranial nerve II-XII are grossly intact, no focal motor deficit, sensory intact to light touch bilaterally.  Extremities : No Cyanosis, Clubbing or Edema   Labs on Admission:  Results for orders placed during the hospital encounter of 04/18/13 (from the past 48 hour(s))  CBC WITH DIFFERENTIAL     Status: Abnormal   Collection Time    04/18/13  8:12 PM      Result Value Range   WBC 14.7 (*) 4.0 - 10.5 K/uL   RBC 4.78  4.22 - 5.81 MIL/uL   Hemoglobin 13.8  13.0 - 17.0 g/dL   HCT 44.3  39.0 - 52.0 %   MCV 92.7  78.0 - 100.0 fL   MCH 28.9  26.0 - 34.0 pg   MCHC 31.2  30.0 - 36.0 g/dL   RDW 12.2  11.5 - 15.5 %   Platelets 187  150 - 400 K/uL   Neutrophils Relative % 77  43 - 77 %   Neutro Abs 11.4 (*) 1.7 - 7.7 K/uL   Lymphocytes Relative 15  12 - 46 %   Lymphs Abs 2.2  0.7 - 4.0 K/uL   Monocytes Relative 7  3 - 12 %   Monocytes Absolute 1.0  0.1 - 1.0 K/uL   Eosinophils Relative 1  0 - 5 %   Eosinophils Absolute 0.2  0.0 - 0.7 K/uL   Basophils Relative 0  0 - 1 %   Basophils Absolute 0.0  0.0 - 0.1 K/uL  TROPONIN I     Status: None   Collection Time    04/18/13  8:12 PM      Result Value Range   Troponin I <0.30  <0.30 ng/mL   Comment:            Due to the release kinetics of cTnI,     a negative result within the first hours     of the onset of symptoms does not rule out     myocardial infarction with certainty.     If myocardial infarction is still suspected,     repeat the test at appropriate intervals.  PRO B NATRIURETIC PEPTIDE     Status: None   Collection Time    04/18/13  8:12 PM      Result Value Range   Pro B Natriuretic peptide (BNP) 50.7  0  - 125 pg/mL  COMPREHENSIVE METABOLIC PANEL     Status: Abnormal   Collection Time    04/18/13  8:12 PM      Result Value Range   Sodium 133 (*) 135 - 145 mEq/L   Potassium 4.5  3.5 - 5.1 mEq/L   Chloride 92 (*) 96 - 112 mEq/L   CO2 36 (*)  19 - 32 mEq/L   Glucose, Bld 194 (*) 70 - 99 mg/dL   BUN 9  6 - 23 mg/dL   Creatinine, Ser 0.87  0.50 - 1.35 mg/dL   Calcium 9.5  8.4 - 10.5 mg/dL   Total Protein 7.1  6.0 - 8.3 g/dL   Albumin 3.6  3.5 - 5.2 g/dL   AST 14  0 - 37 U/L   ALT 21  0 - 53 U/L   Alkaline Phosphatase 83  39 - 117 U/L   Total Bilirubin 0.3  0.3 - 1.2 mg/dL   GFR calc non Af Amer 88 (*) >90 mL/min   GFR calc Af Amer >90  >90 mL/min   Comment:            The eGFR has been calculated     using the CKD EPI equation.     This calculation has not been     validated in all clinical     situations.     eGFR's persistently     <90 mL/min signify     possible Chronic Kidney Disease.  ACETAMINOPHEN LEVEL     Status: None   Collection Time    04/18/13  8:12 PM      Result Value Range   Acetaminophen (Tylenol), Serum <15.0  10 - 30 ug/mL   Comment:            THERAPEUTIC CONCENTRATIONS VARY     SIGNIFICANTLY. A RANGE OF 10-30     ug/mL MAY BE AN EFFECTIVE     CONCENTRATION FOR MANY PATIENTS.     HOWEVER, SOME ARE BEST TREATED     AT CONCENTRATIONS OUTSIDE THIS     RANGE.     ACETAMINOPHEN CONCENTRATIONS     >150 ug/mL AT 4 HOURS AFTER     INGESTION AND >50 ug/mL AT 12     HOURS AFTER INGESTION ARE     OFTEN ASSOCIATED WITH TOXIC     REACTIONS.  ETHANOL     Status: None   Collection Time    04/18/13  8:12 PM      Result Value Range   Alcohol, Ethyl (B) <11  0 - 11 mg/dL   Comment:            LOWEST DETECTABLE LIMIT FOR     SERUM ALCOHOL IS 11 mg/dL     FOR MEDICAL PURPOSES ONLY  SALICYLATE LEVEL     Status: Abnormal   Collection Time    04/18/13  8:12 PM      Result Value Range   Salicylate Lvl 0.1 (*) 2.8 - 20.0 mg/dL  APTT     Status: None   Collection  Time    04/18/13  8:12 PM      Result Value Range   aPTT 26  24 - 37 seconds  PROTIME-INR     Status: None   Collection Time    04/18/13  8:12 PM      Result Value Range   Prothrombin Time 12.4  11.6 - 15.2 seconds   INR 0.94  0.00 - 1.49  URINE RAPID DRUG SCREEN (HOSP PERFORMED)     Status: None   Collection Time    04/18/13 10:19 PM      Result Value Range   Opiates NONE DETECTED  NONE DETECTED   Cocaine NONE DETECTED  NONE DETECTED   Benzodiazepines NONE DETECTED  NONE DETECTED   Amphetamines NONE DETECTED  NONE DETECTED  Tetrahydrocannabinol NONE DETECTED  NONE DETECTED   Barbiturates NONE DETECTED  NONE DETECTED   Comment:            DRUG SCREEN FOR MEDICAL PURPOSES     ONLY.  IF CONFIRMATION IS NEEDED     FOR ANY PURPOSE, NOTIFY LAB     WITHIN 5 DAYS.                LOWEST DETECTABLE LIMITS     FOR URINE DRUG SCREEN     Drug Class       Cutoff (ng/mL)     Amphetamine      1000     Barbiturate      200     Benzodiazepine   [id number]     Tricyclics       [id number]     Opiates          300     Cocaine          300     THC              50  URINALYSIS, ROUTINE W REFLEX MICROSCOPIC     Status: Abnormal   Collection Time    04/18/13 10:19 PM      Result Value Range   Color, Urine YELLOW  YELLOW   APPearance CLEAR  CLEAR   Specific Gravity, Urine 1.015  1.005 - 1.030   pH 6.0  5.0 - 8.0   Glucose, UA 500 (*) NEGATIVE mg/dL   Hgb urine dipstick NEGATIVE  NEGATIVE   Bilirubin Urine NEGATIVE  NEGATIVE   Ketones, ur NEGATIVE  NEGATIVE mg/dL   Protein, ur NEGATIVE  NEGATIVE mg/dL   Urobilinogen, UA 0.2  0.0 - 1.0 mg/dL   Nitrite NEGATIVE  NEGATIVE   Leukocytes, UA NEGATIVE  NEGATIVE   Comment: MICROSCOPIC NOT DONE ON URINES WITH NEGATIVE PROTEIN, BLOOD, LEUKOCYTES, NITRITE, OR GLUCOSE <1000 mg/dL.    Radiological Exams on Admission: Dg Chest Port 1 View  04/18/2013   *RADIOLOGY REPORT*  Clinical Data: Shortness of breath  PORTABLE CHEST - 1 VIEW  Comparison: 08/26/2008   Findings: Chronic interstitial markings.  No focal consolidation or frank interstitial edema. No pleural effusion or pneumothorax.  Cardiomegaly.  IMPRESSION: No evidence of acute cardiopulmonary disease.  Chronic interstitial markings.   Original Report Authenticated By: Julian Hy, M.D.    Assessment/Plan Active Problems:   HYPERTENSION   COPD exacerbation   Paranoid schizophrenia   CAD (coronary artery disease)  COPD exacerbation Will start patient on Solu-Medrol 60 mg IV every 6 hours DuoNeb nebulizers every 4 hours Also start Levaquin 500 mg IV daily  CAD Continue aspirin Plavix Also obtain 2-D echo to rule out underlying CHF, as patient has been on Lasix 3 times a week Patient family not aware of CHF  Paranoid schizophrenia Continue Geodon  Diabetes mellitus Continue Lantus Start sliding scale insulin  DVT prophylaxis Lovenox  Code status: Full code  Family discussion: Discussed with patient's wife and son at bedside   Time Spent on Admission: 43 min  [facility] Pager: [phone] 04/18/2013, 11:59 PM  If 7PM-7AM, please contact night-coverage  [email or web address]  Password [email or web address]

## 2013-04-19 DIAGNOSIS — E785 Hyperlipidemia, unspecified: Secondary | ICD-10-CM

## 2013-04-19 LAB — GLUCOSE, CAPILLARY
Glucose-Capillary: 188 mg/dL — ABNORMAL HIGH (ref 70–99)
Glucose-Capillary: 217 mg/dL — ABNORMAL HIGH (ref 70–99)

## 2013-04-19 LAB — BLOOD GAS, ARTERIAL
Acid-Base Excess: 7.9 mmol/L — ABNORMAL HIGH (ref 0.0–2.0)
Bicarbonate: 33.1 mEq/L — ABNORMAL HIGH (ref 20.0–24.0)
Drawn by: 317771
O2 Saturation: 93.2 %
Patient temperature: 37.7

## 2013-04-19 LAB — COMPREHENSIVE METABOLIC PANEL
AST: 11 U/L (ref 0–37)
Albumin: 3.5 g/dL (ref 3.5–5.2)
Alkaline Phosphatase: 80 U/L (ref 39–117)
Chloride: 89 mEq/L — ABNORMAL LOW (ref 96–112)
Potassium: 5.8 mEq/L — ABNORMAL HIGH (ref 3.5–5.1)
Total Bilirubin: 0.3 mg/dL (ref 0.3–1.2)

## 2013-04-19 LAB — CBC
Platelets: 180 10*3/uL (ref 150–400)
RDW: 12.3 % (ref 11.5–15.5)
WBC: 13.1 10*3/uL — ABNORMAL HIGH (ref 4.0–10.5)

## 2013-04-19 MED ORDER — SODIUM CHLORIDE 0.9 % IJ SOLN
3.0000 mL | Freq: Two times a day (BID) | INTRAMUSCULAR | Status: DC
  Administered 2013-04-19 – 2013-04-23 (×8): 3 mL via INTRAVENOUS

## 2013-04-19 MED ORDER — ONDANSETRON HCL 4 MG/2ML IJ SOLN
4.0000 mg | Freq: Four times a day (QID) | INTRAMUSCULAR | Status: DC | PRN
  Administered 2013-04-19 – 2013-04-20 (×3): 4 mg via INTRAVENOUS
  Filled 2013-04-19 (×2): qty 2

## 2013-04-19 MED ORDER — INSULIN ASPART 100 UNIT/ML ~~LOC~~ SOLN
0.0000 [IU] | Freq: Three times a day (TID) | SUBCUTANEOUS | Status: DC
  Administered 2013-04-19 (×2): 11 [IU] via SUBCUTANEOUS
  Administered 2013-04-19 – 2013-04-20 (×2): 4 [IU] via SUBCUTANEOUS
  Administered 2013-04-20 – 2013-04-21 (×3): 7 [IU] via SUBCUTANEOUS
  Administered 2013-04-21: 11 [IU] via SUBCUTANEOUS
  Administered 2013-04-21: 3 [IU] via SUBCUTANEOUS
  Administered 2013-04-22: 4 [IU] via SUBCUTANEOUS
  Administered 2013-04-22 – 2013-04-23 (×3): 3 [IU] via SUBCUTANEOUS

## 2013-04-19 MED ORDER — INSULIN ASPART 100 UNIT/ML ~~LOC~~ SOLN
2.0000 [IU] | Freq: Three times a day (TID) | SUBCUTANEOUS | Status: DC
  Administered 2013-04-19 – 2013-04-21 (×5): 2 [IU] via SUBCUTANEOUS

## 2013-04-19 MED ORDER — LISINOPRIL 10 MG PO TABS
40.0000 mg | ORAL_TABLET | Freq: Two times a day (BID) | ORAL | Status: DC
  Filled 2013-04-19: qty 4

## 2013-04-19 MED ORDER — LORAZEPAM 2 MG/ML IJ SOLN
0.5000 mg | INTRAMUSCULAR | Status: DC | PRN
  Administered 2013-04-19: 0.5 mg via INTRAVENOUS
  Filled 2013-04-19: qty 1

## 2013-04-19 MED ORDER — LEVOFLOXACIN IN D5W 500 MG/100ML IV SOLN
500.0000 mg | INTRAVENOUS | Status: DC
  Administered 2013-04-19 – 2013-04-21 (×3): 500 mg via INTRAVENOUS
  Filled 2013-04-19 (×3): qty 100

## 2013-04-19 MED ORDER — TRAZODONE HCL 50 MG PO TABS
150.0000 mg | ORAL_TABLET | Freq: Every day | ORAL | Status: DC
  Administered 2013-04-19 – 2013-04-22 (×5): 150 mg via ORAL
  Filled 2013-04-19 (×5): qty 3

## 2013-04-19 MED ORDER — SODIUM CHLORIDE 0.9 % IV SOLN
250.0000 mL | INTRAVENOUS | Status: DC | PRN
  Administered 2013-04-19: 250 mL via INTRAVENOUS

## 2013-04-19 MED ORDER — ACETAMINOPHEN 650 MG RE SUPP: 650.0000 mg | Freq: Four times a day (QID) | RECTAL | Status: DC | PRN

## 2013-04-19 MED ORDER — ALBUTEROL SULFATE (5 MG/ML) 0.5% IN NEBU
INHALATION_SOLUTION | RESPIRATORY_TRACT | Status: AC
  Filled 2013-04-19: qty 0.5

## 2013-04-19 MED ORDER — IPRATROPIUM BROMIDE 0.02 % IN SOLN
0.5000 mg | RESPIRATORY_TRACT | Status: DC
  Administered 2013-04-19 – 2013-04-22 (×23): 0.5 mg via RESPIRATORY_TRACT
  Filled 2013-04-19 (×22): qty 2.5

## 2013-04-19 MED ORDER — QUINAPRIL HCL 10 MG PO TABS
40.0000 mg | ORAL_TABLET | Freq: Two times a day (BID) | ORAL | Status: DC
  Filled 2013-04-19 (×4): qty 4

## 2013-04-19 MED ORDER — ENOXAPARIN SODIUM 40 MG/0.4ML ~~LOC~~ SOLN
40.0000 mg | SUBCUTANEOUS | Status: DC
  Administered 2013-04-19: 40 mg via SUBCUTANEOUS
  Filled 2013-04-19: qty 0.4

## 2013-04-19 MED ORDER — METOPROLOL TARTRATE 50 MG PO TABS
50.0000 mg | ORAL_TABLET | Freq: Two times a day (BID) | ORAL | Status: DC
  Administered 2013-04-19 – 2013-04-23 (×10): 50 mg via ORAL
  Filled 2013-04-19 (×10): qty 1

## 2013-04-19 MED ORDER — ALBUTEROL SULFATE (5 MG/ML) 0.5% IN NEBU
2.5000 mg | INHALATION_SOLUTION | RESPIRATORY_TRACT | Status: DC
  Administered 2013-04-19 (×6): 2.5 mg via RESPIRATORY_TRACT
  Filled 2013-04-19 (×5): qty 0.5

## 2013-04-19 MED ORDER — METHYLPREDNISOLONE SODIUM SUCC 125 MG IJ SOLR
60.0000 mg | Freq: Four times a day (QID) | INTRAMUSCULAR | Status: DC
  Administered 2013-04-19 – 2013-04-21 (×9): 60 mg via INTRAVENOUS
  Filled 2013-04-19 (×9): qty 2

## 2013-04-19 MED ORDER — LEVALBUTEROL HCL 0.63 MG/3ML IN NEBU
0.6300 mg | INHALATION_SOLUTION | RESPIRATORY_TRACT | Status: DC
  Administered 2013-04-19 – 2013-04-22 (×17): 0.63 mg via RESPIRATORY_TRACT
  Filled 2013-04-19 (×17): qty 3

## 2013-04-19 MED ORDER — SODIUM CHLORIDE 0.9 % IV SOLN
INTRAVENOUS | Status: AC
  Administered 2013-04-19: 01:00:00 via INTRAVENOUS

## 2013-04-19 MED ORDER — HYDRALAZINE HCL 25 MG PO TABS: 25.0000 mg | ORAL_TABLET | Freq: Four times a day (QID) | ORAL | Status: DC | PRN

## 2013-04-19 MED ORDER — DM-GUAIFENESIN ER 30-600 MG PO TB12
1.0000 | ORAL_TABLET | Freq: Two times a day (BID) | ORAL | Status: DC
  Administered 2013-04-19 – 2013-04-23 (×10): 1 via ORAL
  Filled 2013-04-19 (×10): qty 1

## 2013-04-19 MED ORDER — SODIUM POLYSTYRENE SULFONATE 15 GM/60ML PO SUSP
15.0000 g | Freq: Once | ORAL | Status: AC
  Administered 2013-04-19: 15 g via ORAL
  Filled 2013-04-19: qty 60

## 2013-04-19 MED ORDER — SODIUM CHLORIDE 0.9 % IJ SOLN: 3.0000 mL | INTRAMUSCULAR | Status: DC | PRN

## 2013-04-19 MED ORDER — GI COCKTAIL ~~LOC~~
30.0000 mL | Freq: Two times a day (BID) | ORAL | Status: DC | PRN
  Administered 2013-04-19 – 2013-04-20 (×2): 30 mL via ORAL
  Filled 2013-04-19 (×2): qty 30

## 2013-04-19 MED ORDER — INSULIN GLARGINE 100 UNIT/ML ~~LOC~~ SOLN
54.0000 [IU] | SUBCUTANEOUS | Status: DC
  Administered 2013-04-19 – 2013-04-22 (×4): 54 [IU] via SUBCUTANEOUS
  Filled 2013-04-19 (×5): qty 0.54

## 2013-04-19 MED ORDER — ACETAMINOPHEN 325 MG PO TABS
650.0000 mg | ORAL_TABLET | Freq: Four times a day (QID) | ORAL | Status: DC | PRN
  Administered 2013-04-19 – 2013-04-21 (×2): 650 mg via ORAL
  Filled 2013-04-19 (×2): qty 2

## 2013-04-19 MED ORDER — CLOPIDOGREL BISULFATE 75 MG PO TABS
75.0000 mg | ORAL_TABLET | Freq: Every day | ORAL | Status: DC
  Administered 2013-04-19: 75 mg via ORAL
  Filled 2013-04-19: qty 1

## 2013-04-19 MED ORDER — ALBUTEROL SULFATE (5 MG/ML) 0.5% IN NEBU: 2.5000 mg | INHALATION_SOLUTION | RESPIRATORY_TRACT | Status: DC

## 2013-04-19 MED ORDER — IPRATROPIUM BROMIDE 0.02 % IN SOLN
RESPIRATORY_TRACT | Status: AC
  Filled 2013-04-19: qty 2.5

## 2013-04-19 MED ORDER — ZIPRASIDONE HCL 80 MG PO CAPS
80.0000 mg | ORAL_CAPSULE | Freq: Every evening | ORAL | Status: DC
  Administered 2013-04-19 – 2013-04-22 (×4): 80 mg via ORAL
  Filled 2013-04-19 (×6): qty 1

## 2013-04-19 MED ORDER — LEVOFLOXACIN IN D5W 500 MG/100ML IV SOLN
INTRAVENOUS | Status: AC
  Filled 2013-04-19: qty 100

## 2013-04-19 MED ORDER — SIMVASTATIN 20 MG PO TABS
20.0000 mg | ORAL_TABLET | Freq: Every day | ORAL | Status: DC
  Administered 2013-04-19 – 2013-04-22 (×5): 20 mg via ORAL
  Filled 2013-04-19 (×5): qty 1

## 2013-04-19 MED ORDER — INSULIN GLARGINE 100 UNIT/ML SOLOSTAR PEN: 54.0000 [IU] | PEN_INJECTOR | Freq: Every evening | SUBCUTANEOUS | Status: DC

## 2013-04-19 MED ORDER — ASPIRIN EC 81 MG PO TBEC
81.0000 mg | DELAYED_RELEASE_TABLET | Freq: Every day | ORAL | Status: DC
  Administered 2013-04-19: 81 mg via ORAL
  Filled 2013-04-19: qty 1

## 2013-04-19 NOTE — Progress Notes (Signed)
Called to see the  Patient for having recurrent nausea vomiting not improved with Zofran. Also worsening shortness of breath with cough. Patient at the time of examination had BiPAP on and was feeling better. Denies nausea vomiting, no chest pain. Patient although tachycardic with heart rate between 120 and 135. BP is elevated He is currently on albuterol and ipratropium scheduled.  On examination Chest: Bilateral wheezing auscultated Heart: S1-S2 regular  Assessment Nausea vomiting Tachycardia COPD Exacerbation Hypertension  Plan Continue Zofran for nausea vomiting. We'll do an EKG. Change albuterol to Xopenex Start hydralazine 25 mg by mouth every 6 hours when necessary for blood pressure greater than 160/100

## 2013-04-19 NOTE — Progress Notes (Signed)
[facility] PROGRESS NOTE  Rick Cruz [id number] DOB: 1945/10/20 DOA: 04/18/2013 PCP: [name], MD  HPI: 67 year old male with a history of COPD, on home oxygen, diabetes mellitus, paranoid schizophrenia, obstructive sleep apnea who was brought to the hospital after patient has been having worsening shortness of breath. Patient is still smoking cigarettes, he smokes 1 pack per day. He has been coughing up phlegm. No fever. No nausea vomiting or diarrhea.  Assessment/Plan:  COPD exacerbation - Patient was some persistent shortness of breath still, especially when he tried to ambulate to the bathroom. Continue steroids, antibiotics, nebulizers. Baseline oxygen at home 3.5 L. He is supposed to use a CPAP at night, however his home apparatus broke down. We'll start CPAP tonight. DM - poorly controlled sugars for now since he was started on steroids. Will increase mealtime coverage. HLD Schizophrenia - continue home medications Hyperkalemia - kayexalate DVT prophylaxis - Lovenox  Code Status: presumed full Family Communication: none  Disposition Plan: home when medically ready  Consultants:  none  Procedures:  none  Anti-infectives   Start     Dose/Rate Route Frequency Ordered Stop   04/19/13 0106  levofloxacin (LEVAQUIN) IVPB 500 mg     500 mg 100 mL/hr over 60 Minutes Intravenous Every 24 hours 04/19/13 0106       Antibiotics Given (last 72 hours)   Date/Time Action Medication Dose Rate   04/19/13 0248 Given   levofloxacin (LEVAQUIN) IVPB 500 mg 500 mg 100 mL/hr      HPI/Subjective: - Endorses shortness or breath with ambulation  Objective: Filed Vitals:   04/19/13 0116 04/19/13 0337 04/19/13 0438 04/19/13 0740  BP:   155/91   Pulse:   89   Temp:   97.8 F (36.6 C)   TempSrc:   Oral   Resp:   20   Height:      Weight:      SpO2: 94% 96% 63% 93%    Intake/Output Summary (Last 24 hours) at 04/19/13 1137 Last data filed at 04/19/13 1003  Gross  per 24 hour  Intake 619.25 ml  Output    250 ml  Net 369.25 ml   Filed Weights   04/18/13 1941 04/19/13 0105  Weight: 131.543 kg (290 lb) 136.079 kg (300 lb)    Exam:   General:  No acute distress at rest  Cardiovascular: regular rate and rhythm, without MRG  Respiratory: Diffuse wheezing throughout bilateral lung fields, moves air well.  Abdomen: soft, not tender to palpation, positive bowel sounds  MSK: no peripheral edema  Neuro: CN 2-12 grossly intact, MS 5/5 in all 4  Data Reviewed: Basic Metabolic Panel:  Recent Labs Lab 04/18/13 2012 04/19/13 0511  NA 133* 129*  K 4.5 5.8*  CL 92* 89*  CO2 36* 33*  GLUCOSE 194* 292*  BUN 9 11  CREATININE 0.87 0.80  CALCIUM 9.5 9.3   Liver Function Tests:  Recent Labs Lab 04/18/13 2012 04/19/13 0511  AST 14 11  ALT 21 19  ALKPHOS 83 80  BILITOT 0.3 0.3  PROT 7.1 6.9  ALBUMIN 3.6 3.5   CBC:  Recent Labs Lab 04/18/13 2012 04/19/13 0511  WBC 14.7* 13.1*  NEUTROABS 11.4*  --   HGB 13.8 13.4  HCT 44.3 43.6  MCV 92.7 92.8  PLT 187 180   Cardiac Enzymes:  Recent Labs Lab 04/18/13 2012  TROPONINI <0.30   BNP (last 3 results)  Recent Labs  04/18/13 2012  PROBNP 50.7   CBG:  Recent Labs Lab 04/19/13 0704  GLUCAP 267*   Studies: Dg Chest Port 1 View  04/18/2013   *RADIOLOGY REPORT*  Clinical Data: Shortness of breath  PORTABLE CHEST - 1 VIEW  Comparison: 08/26/2008  Findings: Chronic interstitial markings.  No focal consolidation or frank interstitial edema. No pleural effusion or pneumothorax.  Cardiomegaly.  IMPRESSION: No evidence of acute cardiopulmonary disease.  Chronic interstitial markings.   Original Report Authenticated By: Julian Hy, M.D.    Scheduled Meds: . sodium chloride   Intravenous STAT  . ipratropium  0.5 mg Nebulization Q4H   And  . albuterol  2.5 mg Nebulization Q4H  . aspirin EC  81 mg Oral Daily  . clopidogrel  75 mg Oral Daily  . dextromethorphan-guaiFENesin   1 tablet Oral BID  . enoxaparin (LOVENOX) injection  40 mg Subcutaneous Q24H  . insulin aspart  0-20 Units Subcutaneous TID WC  . insulin aspart  2 Units Subcutaneous TID WC  . insulin glargine  54 Units Subcutaneous Q24H  . levofloxacin (LEVAQUIN) IV  500 mg Intravenous Q24H  . methylPREDNISolone (SOLU-MEDROL) injection  60 mg Intravenous Q6H  . metoprolol  50 mg Oral BID  . simvastatin  20 mg Oral QHS  . sodium chloride  3 mL Intravenous Q12H  . traZODone  150 mg Oral QHS  . ziprasidone  80 mg Oral QPM   Continuous Infusions:   Active Problems:   HYPERTENSION   COPD exacerbation   Paranoid schizophrenia   CAD (coronary artery disease)   Time spent: [place], MD [facility] Pager [phone]. If 7 PM - 7 AM, please contact night-coverage at [email or web address], password [facility] 04/19/2013, 11:37 AM  LOS: 1 day

## 2013-04-20 ENCOUNTER — Encounter (HOSPITAL_COMMUNITY)
Admission: EM | Disposition: A | Discharge status: Home Health | Payer: Self-pay | Source: Home / Self Care | Attending: Internal Medicine

## 2013-04-20 ENCOUNTER — Inpatient Hospital Stay (HOSPITAL_COMMUNITY): Payer: Medicare Other | Admitting: Anesthesiology

## 2013-04-20 ENCOUNTER — Encounter (HOSPITAL_COMMUNITY): Payer: Self-pay | Admitting: Gastroenterology

## 2013-04-20 ENCOUNTER — Encounter (HOSPITAL_COMMUNITY): Payer: Self-pay | Admitting: Anesthesiology

## 2013-04-20 DIAGNOSIS — K921 Melena: Secondary | ICD-10-CM

## 2013-04-20 DIAGNOSIS — K449 Diaphragmatic hernia without obstruction or gangrene: Secondary | ICD-10-CM

## 2013-04-20 DIAGNOSIS — I517 Cardiomegaly: Secondary | ICD-10-CM

## 2013-04-20 DIAGNOSIS — K920 Hematemesis: Secondary | ICD-10-CM

## 2013-04-20 DIAGNOSIS — K210 Gastro-esophageal reflux disease with esophagitis: Secondary | ICD-10-CM

## 2013-04-20 HISTORY — PX: ESOPHAGOGASTRODUODENOSCOPY (EGD) WITH PROPOFOL: SHX5813

## 2013-04-20 LAB — CBC WITH DIFFERENTIAL/PLATELET
Eosinophils Absolute: 0 10*3/uL (ref 0.0–0.7)
Eosinophils Relative: 0 % (ref 0–5)
HCT: 41.5 % (ref 39.0–52.0)
Hemoglobin: 13.6 g/dL (ref 13.0–17.0)
Lymphocytes Relative: 3 % — ABNORMAL LOW (ref 12–46)
Lymphs Abs: 0.6 10*3/uL — ABNORMAL LOW (ref 0.7–4.0)
MCH: 29.1 pg (ref 26.0–34.0)
MCV: 88.7 fL (ref 78.0–100.0)
Monocytes Relative: 2 % — ABNORMAL LOW (ref 3–12)
Platelets: 199 10*3/uL (ref 150–400)
RBC: 4.68 MIL/uL (ref 4.22–5.81)
WBC: 21.2 10*3/uL — ABNORMAL HIGH (ref 4.0–10.5)

## 2013-04-20 LAB — BASIC METABOLIC PANEL
BUN: 16 mg/dL (ref 6–23)
CO2: 36 mEq/L — ABNORMAL HIGH (ref 19–32)
Calcium: 9.4 mg/dL (ref 8.4–10.5)
Calcium: 9.1 mg/dL (ref 8.4–10.5)
Creatinine, Ser: 0.77 mg/dL (ref 0.50–1.35)
GFR calc non Af Amer: >90 mL/min (ref >90)
Glucose, Bld: 251 mg/dL — ABNORMAL HIGH (ref 70–99)
Glucose, Bld: 253 mg/dL — ABNORMAL HIGH (ref 70–99)
Sodium: 125 mEq/L — ABNORMAL LOW (ref 135–145)
Sodium: 126 mEq/L — ABNORMAL LOW (ref 135–145)

## 2013-04-20 LAB — OCCULT BLOOD GASTRIC / DUODENUM (SPECIMEN CUP)
Occult Blood, Gastric: POSITIVE — AB
pH, Gastric: 3

## 2013-04-20 LAB — GLUCOSE, CAPILLARY
Glucose-Capillary: 234 mg/dL — ABNORMAL HIGH (ref 70–99)
Glucose-Capillary: 212 mg/dL — ABNORMAL HIGH (ref 70–99)
Glucose-Capillary: 170 mg/dL — ABNORMAL HIGH (ref 70–99)

## 2013-04-20 LAB — CBC
Hemoglobin: 12.8 g/dL — ABNORMAL LOW (ref 13.0–17.0)
Hemoglobin: 13.5 g/dL (ref 13.0–17.0)
MCH: 29.1 pg (ref 26.0–34.0)
MCH: 28.9 pg (ref 26.0–34.0)
MCHC: 32.8 g/dL (ref 30.0–36.0)
MCHC: 32.5 g/dL (ref 30.0–36.0)
RDW: 12.2 % (ref 11.5–15.5)

## 2013-04-20 LAB — TYPE AND SCREEN: ABO/RH(D): A POS

## 2013-04-20 SURGERY — ESOPHAGOGASTRODUODENOSCOPY (EGD) WITH PROPOFOL
Anesthesia: Monitor Anesthesia Care

## 2013-04-20 MED ORDER — LACTATED RINGERS IV SOLN: INTRAVENOUS | Status: DC

## 2013-04-20 MED ORDER — LACTATED RINGERS IV SOLN
INTRAVENOUS | Status: DC | PRN
  Administered 2013-04-20: 11:00:00 via INTRAVENOUS

## 2013-04-20 MED ORDER — PROPOFOL 10 MG/ML IV EMUL
INTRAVENOUS | Status: AC
  Filled 2013-04-20: qty 40

## 2013-04-20 MED ORDER — PANTOPRAZOLE SODIUM 40 MG IV SOLR
INTRAVENOUS | Status: AC
  Filled 2013-04-20: qty 80

## 2013-04-20 MED ORDER — PANTOPRAZOLE SODIUM 40 MG IV SOLR
40.0000 mg | Freq: Two times a day (BID) | INTRAVENOUS | Status: DC
  Administered 2013-04-20 – 2013-04-22 (×5): 40 mg via INTRAVENOUS
  Filled 2013-04-20 (×5): qty 40

## 2013-04-20 MED ORDER — PROPOFOL 10 MG/ML IV BOLUS
INTRAVENOUS | Status: DC | PRN
  Administered 2013-04-20: 20 mg via INTRAVENOUS

## 2013-04-20 MED ORDER — SODIUM CHLORIDE 0.9 % IV SOLN
INTRAVENOUS | Status: DC
  Administered 2013-04-20: 11:00:00 via INTRAVENOUS

## 2013-04-20 MED ORDER — STERILE WATER FOR IRRIGATION IR SOLN
Status: DC | PRN
  Administered 2013-04-20: 12:00:00

## 2013-04-20 MED ORDER — PROMETHAZINE HCL 25 MG/ML IJ SOLN
12.5000 mg | INTRAMUSCULAR | Status: DC | PRN
  Administered 2013-04-20: 12.5 mg via INTRAVENOUS
  Filled 2013-04-20: qty 1

## 2013-04-20 MED ORDER — METOPROLOL TARTRATE 1 MG/ML IV SOLN
INTRAVENOUS | Status: AC
  Filled 2013-04-20: qty 5

## 2013-04-20 MED ORDER — CHLORHEXIDINE GLUCONATE 0.12 % MT SOLN
15.0000 mL | Freq: Two times a day (BID) | OROMUCOSAL | Status: DC
  Administered 2013-04-20 – 2013-04-23 (×6): 15 mL via OROMUCOSAL
  Filled 2013-04-20 (×7): qty 15

## 2013-04-20 MED ORDER — ONDANSETRON HCL 4 MG/2ML IJ SOLN
4.0000 mg | Freq: Once | INTRAMUSCULAR | Status: AC | PRN
  Filled 2013-04-20: qty 2

## 2013-04-20 MED ORDER — FENTANYL CITRATE 0.05 MG/ML IJ SOLN
INTRAMUSCULAR | Status: AC
  Filled 2013-04-20: qty 2

## 2013-04-20 MED ORDER — GLYCOPYRROLATE 0.2 MG/ML IJ SOLN
0.2000 mg | Freq: Once | INTRAMUSCULAR | Status: AC
  Administered 2013-04-20: 0.2 mg via INTRAVENOUS

## 2013-04-20 MED ORDER — BIOTENE DRY MOUTH MT LIQD
15.0000 mL | Freq: Four times a day (QID) | OROMUCOSAL | Status: DC
  Administered 2013-04-20 – 2013-04-22 (×10): 15 mL via OROMUCOSAL

## 2013-04-20 MED ORDER — MIDAZOLAM HCL 2 MG/2ML IJ SOLN
1.0000 mg | INTRAMUSCULAR | Status: DC | PRN
  Administered 2013-04-20: 2 mg via INTRAVENOUS

## 2013-04-20 MED ORDER — FENTANYL CITRATE 0.05 MG/ML IJ SOLN
25.0000 ug | INTRAMUSCULAR | Status: DC | PRN
  Administered 2013-04-20: 50 ug via INTRAVENOUS
  Administered 2013-04-21 (×2): 25 ug via INTRAVENOUS
  Filled 2013-04-20 (×3): qty 2

## 2013-04-20 MED ORDER — METOPROLOL TARTRATE 1 MG/ML IV SOLN
5.0000 mg | Freq: Once | INTRAVENOUS | Status: AC
  Administered 2013-04-20: 5 mg via INTRAVENOUS

## 2013-04-20 MED ORDER — SODIUM CHLORIDE 0.9 % IV SOLN
8.0000 mg/h | INTRAVENOUS | Status: DC
  Administered 2013-04-20: 8 mg/h via INTRAVENOUS
  Filled 2013-04-20 (×4): qty 80

## 2013-04-20 MED ORDER — SUCRALFATE 1 GM/10ML PO SUSP
1.0000 g | Freq: Three times a day (TID) | ORAL | Status: DC
  Administered 2013-04-20 – 2013-04-23 (×12): 1 g via ORAL
  Filled 2013-04-20 (×12): qty 10

## 2013-04-20 MED ORDER — PANTOPRAZOLE SODIUM 40 MG IV SOLR
40.0000 mg | Freq: Two times a day (BID) | INTRAVENOUS | Status: DC
  Administered 2013-04-20: 40 mg via INTRAVENOUS
  Filled 2013-04-20: qty 40

## 2013-04-20 MED ORDER — HYDRALAZINE HCL 20 MG/ML IJ SOLN
10.0000 mg | Freq: Four times a day (QID) | INTRAMUSCULAR | Status: DC | PRN
  Administered 2013-04-20 – 2013-04-21 (×3): 10 mg via INTRAVENOUS
  Filled 2013-04-20 (×3): qty 1

## 2013-04-20 MED ORDER — BUTAMBEN-TETRACAINE-BENZOCAINE 2-2-14 % EX AERO
2.0000 | INHALATION_SPRAY | Freq: Once | CUTANEOUS | Status: AC
  Administered 2013-04-20: 2 via TOPICAL

## 2013-04-20 MED ORDER — MIDAZOLAM HCL 2 MG/2ML IJ SOLN
INTRAMUSCULAR | Status: AC
  Filled 2013-04-20: qty 2

## 2013-04-20 MED ORDER — LEVOFLOXACIN IN D5W 500 MG/100ML IV SOLN
INTRAVENOUS | Status: AC
  Filled 2013-04-20: qty 100

## 2013-04-20 MED ORDER — GLYCOPYRROLATE 0.2 MG/ML IJ SOLN
INTRAMUSCULAR | Status: AC
  Filled 2013-04-20: qty 1

## 2013-04-20 MED ORDER — HYDROXYZINE HCL 25 MG PO TABS
25.0000 mg | ORAL_TABLET | Freq: Three times a day (TID) | ORAL | Status: DC | PRN
  Administered 2013-04-21: 25 mg via ORAL
  Filled 2013-04-20: qty 1

## 2013-04-20 MED ORDER — PROPOFOL INFUSION 10 MG/ML OPTIME
INTRAVENOUS | Status: DC | PRN
  Administered 2013-04-20: 50 ug/kg/min via INTRAVENOUS

## 2013-04-20 MED ORDER — LIDOCAINE HCL (PF) 1 % IJ SOLN
INTRAMUSCULAR | Status: AC
  Filled 2013-04-20: qty 5

## 2013-04-20 MED ORDER — FENTANYL CITRATE 0.05 MG/ML IJ SOLN
25.0000 ug | INTRAMUSCULAR | Status: DC
  Administered 2013-04-20: 25 ug via INTRAVENOUS

## 2013-04-20 SURGICAL SUPPLY — 19 items
BLOCK BITE 60FR ADLT L/F BLUE (MISCELLANEOUS) ×1 IMPLANT
DEVICE CLIP HEMOSTAT 235CM (CLIP) IMPLANT
ELECT REM PT RETURN 9FT ADLT (ELECTROSURGICAL) ×2
ELECTRODE REM PT RTRN 9FT ADLT (ELECTROSURGICAL) IMPLANT
FLOOR PAD 36X40 (MISCELLANEOUS) ×2
FORCEPS BIOP RAD 4 LRG CAP 4 (CUTTING FORCEPS) IMPLANT
FORMALIN 10 PREFIL 20ML (MISCELLANEOUS) IMPLANT
MANIFOLD NEPTUNE II (INSTRUMENTS) ×1 IMPLANT
NDL SCLEROTHERAPY 25GX240 (NEEDLE) IMPLANT
NEEDLE SCLEROTHERAPY 25GX240 (NEEDLE) IMPLANT
PAD FLOOR 36X40 (MISCELLANEOUS) IMPLANT
PROBE APC STR FIRE (PROBE) IMPLANT
PROBE INJECTION GOLD (MISCELLANEOUS)
PROBE INJECTION GOLD 7FR (MISCELLANEOUS) IMPLANT
SNARE ROTATE MED OVAL 20MM (MISCELLANEOUS) IMPLANT
SYR 50ML LL SCALE MARK (SYRINGE) ×1 IMPLANT
TUBING ENDO SMARTCAP PENTAX (MISCELLANEOUS) ×1 IMPLANT
TUBING IRRIGATION ENDOGATOR (MISCELLANEOUS) ×1 IMPLANT
WATER STERILE IRR 1000ML POUR (IV SOLUTION) ×1 IMPLANT

## 2013-04-20 NOTE — Progress Notes (Signed)
Patient had c/o heartburn and nausea throughout the shift.  Medications were unsuccessful and patient later became SOB and tachycardic with appropriate ABG and EKG 12 lead preformed.  Patient was anxious and calmed down with medication.  Patient showed signs of intermittent confusion which couldn't be specifically attributed to medications or his shortness of breath.  Patient started to projectile emesis with bloody like, darkish brown without a foul odor.  On-call MD called and came to the room immediately and decided to transfer him to Step down ICU.  Report will be given to ICU RN Thayer Headings via bedside.  Wife will be called to let her know of his transfer.

## 2013-04-20 NOTE — Anesthesia Postprocedure Evaluation (Signed)
  Anesthesia Post-op Note  Patient: Rick Cruz  Procedure(s) Performed: Procedure(s): ESOPHAGOGASTRODUODENOSCOPY (EGD) WITH PROPOFOL (N/A)  Patient Location: PACU  Anesthesia Type:MAC  Level of Consciousness: awake, alert  and oriented  Airway and Oxygen Therapy: Patient Spontanous Breathing and Patient connected to nasal cannula oxygen  Post-op Pain: mild  Post-op Assessment: Post-op Vital signs reviewed, Patient's Cardiovascular Status Stable, Respiratory Function Stable, Patent Airway, No signs of Nausea or vomiting, Adequate PO intake, Pain level controlled, No headache, No backache, No residual numbness and No residual motor weakness  Post-op Vital Signs: Reviewed and stable  Complications: No apparent anesthesia complications

## 2013-04-20 NOTE — Progress Notes (Signed)
*  PRELIMINARY RESULTS* Echocardiogram 2D Echocardiogram has been performed.  Tera Partridge 04/20/2013, 4:48 PM

## 2013-04-20 NOTE — Op Note (Signed)
[facility] [address] [place], [address]   ENDOSCOPY PROCEDURE REPORT  PATIENT: Rick, Cruz  MR#: [id number] BIRTHDATE: 1946/08/04 , 67  yrs. old GENDER: Male ENDOSCOPIST: R.  Garfield Cornea, MD FACP FACG REFERRED BY:  [name], M.D. PROCEDURE DATE:  04/20/2013 PROCEDURE:     Diagnostic EGD  INDICATIONS:     Hematemesis  INFORMED CONSENT:   The risks, benefits, limitations, alternatives and imponderables have been discussed.  The potential for biopsy, esophogeal dilation, etc. have also been reviewed.  Questions have been answered.  All parties agreeable.  Please see the history and physical in the medical record for more information.  MEDICATIONS:   Deep sedation per Dr. Duway[facility]  DESCRIPTION OF PROCEDURE:   The     endoscope was introduced through the mouth and advanced to the second portion of the duodenum without difficulty or limitations.  The mucosal surfaces were surveyed very carefully during advancement of the scope and upon withdrawal.  Retroflexion view of the proximal stomach and esophagogastric junction was performed.      FINDINGS: Severe inflammatory changes involving the distal two thirds of the tubular esophagus characterized by linear circumferential erosions and marked friability.   Stomach empt aside from some old blood tinged fluid. Small hiatal hernia. No ulcer or infiltrating process. Patent pylorus. Normal-appearing bulb and second portion of the duodenum  THERAPEUTIC / DIAGNOSTIC MANEUVERS PERFORMED:  None   COMPLICATIONS:  None  IMPRESSION:  Severe erosive reflux esophagitis -  likely secondary to "un-masked" gastroparesis in the setting of acute illness.  RECOMMENDATIONS:   Twice daily proton pump inhibitor therapy. Add Carafate suspension. Clear liquid diet.    _______________________________ R. Garfield Cornea, MD FACP [facility] eSigned:  R. Garfield Cornea, MD FACP [facility] 04/20/2013 11:55  AM     CC:

## 2013-04-20 NOTE — Progress Notes (Addendum)
Called by nurse to see the patient who had 1 episode of coffee-ground emesis. Patient at this time is alert, mildly confused after he received Ativan earlier. Vitals are stable. Patient is currently on aspirin and Plavix for CAD. Upper GI Bleed We'll transfer the patient to ICU Discontinue aspirin Plavix and Lovenox Started on at 40 mg IV every 12 hours H&H every 6 hours We'll consult GI in the morning, no GI available at night at this time.

## 2013-04-20 NOTE — Consult Note (Signed)
Referring Provider: Dr. Eleonore Chiquito Primary Care Physician:  Rick Gravel, MD Primary Gastroenterologist:  Dr. Gala Romney   Date of Admission: 04/18/13 Date of Consultation: 04/20/13  Reason for Consultation:  Coffee-ground emesis  HPI:  Rick Cruz is a 67 year old male who was admitted to [facility] on August 2nd due to worsening shortness of breath. His history is significant for COPD on home oxygen at 3.5 liters. Overnight, he experienced several episodes of repeated vomiting secondary to nausea; this was not improved with Zofran. Early this morning, he had one episode of darkish brown/coffee-ground emesis; he has since been transferred to the ICU.   At time of consultation, patient is receiving nebulizer treatments per respiratory therapy. He is quite drowsy from a restless night and was unable to provide much information. His wife is at bedside and offered the history. States he does not take any NSAIDs or aspirin powders at home. Complained of occasional indigestion with Zantac as needed, but this is not often. Denies melena. No further episodes of vomiting since transfer to ICU. Patient was able to nod his head in response to some questions; he denies abdominal pain, recurrent nausea at this time.   No prior EGD. As an outpatient, he was taking Aspirin 81 mg, Plavix. While inpatient, he has been on IV steroids. Last doses of Plavix and Lovenox were yesterday morning.    Past Medical History  Diagnosis Date  . COPD (chronic obstructive pulmonary disease)   . Diabetes mellitus without complication   . CHF (congestive heart failure)   . Hypercholesterolemia   . Depression   . Schizophrenia   . Sleep apnea   . Tobacco abuse   . On home O2     3 liters    Past Surgical History  Procedure Laterality Date  . Knee surgery      X2  . 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   . Colonoscopy  July 2011     Dr. Benson Norway: multiple polyps, diverticulosis, internal and external hemorrhoids, repeat 5 years    Prior to Admission medications   Medication Sig Start Date End Date Taking? Authorizing Provider  aspirin EC 81 MG tablet Take 81 mg by mouth daily.   Yes Historical Provider, MD  Cinnamon 500 MG capsule Take 500 mg by mouth daily.   Yes Historical Provider, MD  clopidogrel (PLAVIX) 75 MG tablet Take 75 mg by mouth daily.   Yes Historical Provider, MD  furosemide (LASIX) 40 MG tablet Take 40 mg by mouth every other day. Patient takes Mon.,Wed.,Fri.,   Yes Historical Provider, MD  glipiZIDE (GLUCOTROL) 10 MG tablet Take 20 mg by mouth 2 (two) times daily before a meal.   Yes Historical Provider, MD  HYDROcodone-acetaminophen (NORCO) 7.5-325 MG per tablet Take 1 tablet by mouth 4 (four) times daily as needed for pain.   Yes Historical Provider, MD  insulin aspart (NOVOLOG FLEXPEN) 100 UNIT/ML SOPN FlexPen Inject 2 Units into the skin 3 (three) times daily with meals.   Yes Historical Provider, MD  Insulin Glargine (LANTUS SOLOSTAR) 100 UNIT/ML SOPN Inject 54 Units into the skin every evening.   Yes Historical Provider, MD  Liraglutide (VICTOZA) 18 MG/3ML SOPN Inject 1.2 Units into the skin daily.   Yes Historical Provider, MD  metFORMIN (GLUCOPHAGE) 1000 MG tablet Take 1,000 mg by mouth every evening.   Yes Historical Provider, MD  metoprolol (LOPRESSOR) 50 MG  tablet Take 50 mg by mouth 2 (two) times daily.   Yes Historical Provider, MD  Omega-3 Fatty Acids (FISH OIL) 1000 MG CAPS Take 1,000 mg by mouth daily.   Yes Historical Provider, MD  potassium chloride SA (K-DUR,KLOR-CON) 20 MEQ tablet Take 20 mEq by mouth every other day. Patient takes on Mon.,Wed.,Fri.   Yes Historical Provider, MD  quinapril (ACCUPRIL) 40 MG tablet Take 40 mg by mouth 2 (two) times daily.   Yes Historical Provider, MD  simvastatin (ZOCOR) 40 MG tablet Take 40 mg by mouth every evening.   Yes Historical Provider, MD  traZODone  (DESYREL) 150 MG tablet Take 300 mg by mouth at bedtime.   Yes Historical Provider, MD  ziprasidone (GEODON) 20 MG capsule Take 20 mg by mouth daily.   Yes Historical Provider, MD  ziprasidone (GEODON) 80 MG capsule Take 80 mg by mouth every evening.   Yes Historical Provider, MD    Current Facility-Administered Medications  Medication Dose Route Frequency Provider Last Rate Last Dose  . 0.9 %  sodium chloride infusion  250 mL Intravenous PRN Oswald Hillock, MD 10 mL/hr at 04/19/13 1838 250 mL at 04/19/13 [id number]  . acetaminophen (TYLENOL) tablet 650 mg  650 mg Oral Q6H PRN Oswald Hillock, MD   650 mg at 04/19/13 1422   Or  . acetaminophen (TYLENOL) suppository 650 mg  650 mg Rectal Q6H PRN Oswald Hillock, MD      . antiseptic oral rinse (BIOTENE) solution 15 mL  15 mL Mouth Rinse QID Oswald Hillock, MD      . chlorhexidine (PERIDEX) 0.12 % solution 15 mL  15 mL Mouth/Throat BID Oswald Hillock, MD      . dextromethorphan-guaiFENesin [facility] DM) 30-600 MG per 12 hr tablet 1 tablet  1 tablet Oral BID Oswald Hillock, MD   1 tablet at 04/19/13 2147  . gi cocktail (Maalox,Lidocaine,Donnatal)  30 mL Oral BID PRN Marzetta Board, MD   30 mL at 04/19/13 2017  . hydrALAZINE (APRESOLINE) injection 10 mg  10 mg Intravenous Q6H PRN Oswald Hillock, MD   10 mg at 04/20/13 0213  . insulin aspart (novoLOG) injection 0-20 Units  0-20 Units Subcutaneous TID WC Oswald Hillock, MD   4 Units at 04/19/13 1750  . insulin aspart (novoLOG) injection 2 Units  2 Units Subcutaneous TID WC Marzetta Board, MD   2 Units at 04/19/13 1751  . insulin glargine (LANTUS) injection 54 Units  54 Units Subcutaneous Q24H Oswald Hillock, MD   54 Units at 04/19/13 1751  . ipratropium (ATROVENT) nebulizer solution 0.5 mg  0.5 mg Nebulization Q4H Oswald Hillock, MD   0.5 mg at 04/20/13 0744  . levalbuterol (XOPENEX) nebulizer solution 0.63 mg  0.63 mg Nebulization Q4H Oswald Hillock, MD   0.63 mg at 04/20/13 0744  . levofloxacin (LEVAQUIN) IVPB 500 mg  500 mg  Intravenous Q24H Oswald Hillock, MD   500 mg at 04/20/13 0301  . methylPREDNISolone sodium succinate (SOLU-MEDROL) 125 mg/2 mL injection 60 mg  60 mg Intravenous Q6H Oswald Hillock, MD   60 mg at 04/20/13 0149  . metoprolol (LOPRESSOR) tablet 50 mg  50 mg Oral BID Oswald Hillock, MD   50 mg at 04/19/13 2147  . ondansetron (ZOFRAN) injection 4 mg  4 mg Intravenous Q6H PRN Oswald Hillock, MD   4 mg at 04/20/13 0150  . pantoprazole (PROTONIX) 80 mg in sodium chloride 0.9 %  250 mL infusion  8 mg/hr Intravenous Continuous Oswald Hillock, MD 25 mL/hr at 04/20/13 0600 8 mg/hr at 04/20/13 0600  . simvastatin (ZOCOR) tablet 20 mg  20 mg Oral QHS Oswald Hillock, MD   20 mg at 04/19/13 2147  . sodium chloride 0.9 % injection 3 mL  3 mL Intravenous Q12H Oswald Hillock, MD   3 mL at 04/19/13 2148  . sodium chloride 0.9 % injection 3 mL  3 mL Intravenous PRN Oswald Hillock, MD      . traZODone (DESYREL) tablet 150 mg  150 mg Oral QHS Oswald Hillock, MD   150 mg at 04/19/13 2147  . ziprasidone (GEODON) capsule 80 mg  80 mg Oral QPM Oswald Hillock, MD   80 mg at 04/19/13 1749    Allergies as of 04/18/2013  . (No Known Allergies)    Family History  Problem Relation Age of Onset  . Colon cancer Neg Hx     History   Social History  . Marital Status: Married    Spouse Name: N/A    Number of Children: N/A  . Years of Education: N/A   Occupational History  . Not on file.   Social History Main Topics  . Smoking status: Current Every Day Smoker    Last Attempt to Quit: 04/16/2013  . Smokeless tobacco: Not on file  . Alcohol Use: No     Comment: history of ETOH abuse in remote past, none in at least 10 years  . Drug Use: No  . Sexually Active: Not on file   Other Topics Concern  . Not on file   Social History Narrative  . No narrative on file    Review of Systems: Unable to obtain at time of consultation  Physical Exam: Vital signs in last 24 hours: Temp:  [97.8 F (36.6 C)-99.7 F (37.6 C)] 97.9 F (36.6  C) (08/04 0724) Pulse Rate:  [60-130] 60 (08/04 0600) Resp:  [17-24] 17 (08/04 0700) BP: (118-173)/(73-143) 151/74 mmHg (08/04 0700) SpO2:  [84 %-98 %] 98 % (08/04 0751) Last BM Date: 04/18/13 General:   Appears older than stated age, drowsy, responds to name.  Head:  Normocephalic and atraumatic. Eyes:  Sclera clear, no icterus.   Conjunctiva pink. Nose:  No deformity, discharge,  or lesions. Mouth:  Oral mucosa pink and moist  Neck:  Supple; no masses or thyromegaly. Lungs:  Scattered rhonchi, coarse, wheezing bilaterally Heart:  S1 S2 present Abdomen:  Obese, large AP diameter, non-tender, unable to appreciate HSM due to large body habitus. No rebound or guarding. Rectal:  Deferred  Msk:  Symmetrical without gross deformities. Normal posture. Extremities:  Without edema Neurologic:  Unable to assess, patient drowsy   Intake/Output from previous day: 08/03 0701 - 08/04 0700 In: 1255.4 [P.O.:960; I.V.:195.4; IV Piggyback:100] Out: 1175 [Urine:1175] Intake/Output this shift:    Lab Results:  Recent Labs  04/18/13 2012 04/19/13 0511 04/20/13 0136  WBC 14.7* 13.1* 21.2*  HGB 13.8 13.4 13.6  HCT 44.3 43.6 41.5  PLT 187 180 199   BMET  Recent Labs  04/18/13 2012 04/19/13 0511 04/19/13 1615 04/20/13 0136  NA 133* 129*  --  125*  K 4.5 5.8* 4.5 5.0  CL 92* 89*  --  84*  CO2 36* 33*  --  36*  GLUCOSE 194* 292*  --  251*  BUN 9 11  --  16  CREATININE 0.87 0.80  --  0.75  CALCIUM 9.5  9.3  --  9.4   LFT  Recent Labs  04/18/13 2012 04/19/13 0511  PROT 7.1 6.9  ALBUMIN 3.6 3.5  AST 14 11  ALT 21 19  ALKPHOS 83 80  BILITOT 0.3 0.3   PT/INR  Recent Labs  04/18/13 2012  LABPROT 12.4  INR 0.94    Studies/Results: Dg Chest Port 1 View  04/18/2013   *RADIOLOGY REPORT*  Clinical Data: Shortness of breath  PORTABLE CHEST - 1 VIEW  Comparison: 08/26/2008  Findings: Chronic interstitial markings.  No focal consolidation or frank interstitial edema. No  pleural effusion or pneumothorax.  Cardiomegaly.  IMPRESSION: No evidence of acute cardiopulmonary disease.  Chronic interstitial markings.   Original Report Authenticated By: Julian Hy, M.D.    Impression: 67 year old male admitted with COPD exacerbation, now with one episode of hematemesis in the setting of repetitive vomiting. Prior to admission, he was taking aspirin and Plavix, with IV steroids started upon admission. No PPI while outpatient. Question Mallory-Weiss tear as culprit but unable to exclude underlying gastritis, esophagitis, PUD in this setting. His wife denies any outpatient medications such as NSAIDs or aspirin powders.  Per Dr. Cruzita Lederer, hospitalist, patient has improved from a respiratory standpoint since admission. Last dose of Plavix and Lovenox were both yesterday morning, and a Protonix drip has been started by attending at time of hematemesis.  EGD this admission would be beneficial for further assessment, and he is at a slightly increased risk due to underlying chronic respiratory conditions. It is notable that his Hgb has remained stable throughout admission.   Please note that patient's history was obtained primarily from his wife, as patient was quite lethargic after a restless night.   Plan: ~Remain NPO ~Continue Protonix drip for now ~Likely EGD with Dr. Gala Romney later today, possibly tomorrow. I discussed the risks and benefits with his wife at the bedside, who stated understanding. ~Continue to hold Lovenox and Plavix ~Further recommendations after EGD.   Rick Cruz, ANP-BC [facility]  8:48 AM     LOS: 2 days    04/20/2013,   EGD with Dr. Gala Romney today with Propofol due to polypharmacy and respiratory status. More recent blood work reveals Hgb 12.8, down almost a gram from overnight, likely multifactorial. Leukocytosis likely multifactorial as well with presence of IV steroids.  Rick Cruz, ANP-BC [facility]enter[facility] . 9:59  AM   Attending note:    Agree with plans for EGD. Will approach him with relatively light sedation in the operating room with the help of Dr. Duwayne Heck given his tenuous pulmonary status. Discussed with patient and family members. Further recommendations to follow

## 2013-04-20 NOTE — Progress Notes (Signed)
Inpatient Diabetes Program Recommendations  AACE/ADA: New Consensus Statement on Inpatient Glycemic Control (2013)  Target Ranges:  Prepandial:   less than 140 mg/dL      Peak postprandial:   less than 180 mg/dL (1-2 hours)      Critically ill patients:  140 - 180 mg/dL   Results for OAKS, CRONISTER (MRN [id number]) as of 04/20/2013 10:11  Ref. Range 04/19/2013 07:04 04/19/2013 11:52 04/19/2013 16:38 04/19/2013 22:02 04/20/2013 07:22  Glucose-Capillary Latest Range: 70-99 mg/dL 267 (H) 281 (H) 188 (H) 217 (H) 241 (H)    Inpatient Diabetes Program Recommendations Correction (SSI): Please consider changing frequency of CBGs and Novolog correction to Q4H while NPO. A1C: Please order an A1C to determine glycemic control over the past 2-3 months.  Note: Patient has a history of diabetes and takes Lantus 54 units daily, Victoza 1.2 mg daily, Metformin 1000 mg every evening, and Glipizide 20 mg BID at home for diabetes management.  Currently, patient is ordered to receive Lantus 54 units Q24H, Novolog 0-20 units AC, and Novolog 2 units TID with meals for inpatient glycemic control.  Blood glucose over the past 24 hours has ranged from 188-281 mg/dl.  Note that patient has been changed to NPO due to EGD later today or tomorrow.  Please consider changing frequency of CBGs and Novolog correction to Q4H while NPO and order an A1C to determine glycemic control over the past 2-3 months.  Will continue to follow.  Thanks, Barnie Alderman, RN, MSN, CCRN Diabetes Coordinator Inpatient Diabetes Program [phone]

## 2013-04-20 NOTE — Progress Notes (Signed)
[facility] PROGRESS NOTE  Rick Cruz [id number] DOB: 1946-04-24 DOA: 04/18/2013 PCP: [name], MD  HPI: 67 year old male with a history of COPD, on home oxygen, diabetes mellitus, paranoid schizophrenia, obstructive sleep apnea who was brought to the hospital after patient has been having worsening shortness of breath. Patient is still smoking cigarettes, he smokes 1 pack per day. He has been coughing up phlegm. No fever. No nausea vomiting or diarrhea.  Assessment/Plan: Coffee ground emesis - noted overnight, appreciate GI input.  COPD exacerbation - mild improvement, continue steroids, antibiotics, nebulizer.  DM - poorly controlled sugars for now since he was started on steroids. Will increase mealtime coverage. HLD Schizophrenia - continue home medications Hyperkalemia - improved.  DVT prophylaxis - Lovenox  Code Status: presumed full Family Communication: none  Disposition Plan: home when medically ready, now transferred to SDU  Consultants:  none  Procedures:  none  Anti-infectives   Start     Dose/Rate Route Frequency Ordered Stop   04/19/13 0106  levofloxacin (LEVAQUIN) IVPB 500 mg     500 mg 100 mL/hr over 60 Minutes Intravenous Every 24 hours 04/19/13 0106       Antibiotics Given (last 72 hours)   Date/Time Action Medication Dose Rate   04/19/13 0248 Given   levofloxacin (LEVAQUIN) IVPB 500 mg 500 mg 100 mL/hr   04/20/13 0301 Given   levofloxacin (LEVAQUIN) IVPB 500 mg 500 mg 100 mL/hr      HPI/Subjective: - breathing a bit better this morning  Objective: Filed Vitals:   04/20/13 0724 04/20/13 0751 04/20/13 0800 04/20/13 0900  BP:   140/80 171/79  Pulse:   101 90  Temp: 97.9 F (36.6 C)     TempSrc: Oral     Resp:   23 16  Height:      Weight:      SpO2:  98% 100% 94%    Intake/Output Summary (Last 24 hours) at 04/20/13 1012 Last data filed at 04/20/13 [id number]  Gross per 24 hour  Intake 1012.42 ml  Output    800 ml  Net 212.42  ml   Filed Weights   04/18/13 1941 04/19/13 0105  Weight: 131.543 kg (290 lb) 136.079 kg (300 lb)    Exam:   General:  No acute distress at rest  Cardiovascular: regular rate and rhythm, without MRG  Respiratory: Diffuse wheezing throughout bilateral lung fields, moves air well, unchanged much since yesterday.  Abdomen: soft, not tender to palpation, positive bowel sounds  MSK: no peripheral edema  Neuro: CN 2-12 grossly intact, MS 5/5 in all 4  Data Reviewed: Basic Metabolic Panel:  Recent Labs Lab 04/18/13 2012 04/19/13 0511 04/19/13 1615 04/20/13 0136 04/20/13 0832  NA 133* 129*  --  125* 126*  K 4.5 5.8* 4.5 5.0 4.8  CL 92* 89*  --  84* 85*  CO2 36* 33*  --  36* 34*  GLUCOSE 194* 292*  --  251* 253*  BUN 9 11  --  16 17  CREATININE 0.87 0.80  --  0.75 0.77  CALCIUM 9.5 9.3  --  9.4 9.1   Liver Function Tests:  Recent Labs Lab 04/18/13 2012 04/19/13 0511  AST 14 11  ALT 21 19  ALKPHOS 83 80  BILITOT 0.3 0.3  PROT 7.1 6.9  ALBUMIN 3.6 3.5   CBC:  Recent Labs Lab 04/18/13 2012 04/19/13 0511 04/20/13 0136 04/20/13 0832  WBC 14.7* 13.1* 21.2* 18.5*  NEUTROABS 11.4*  --  20.2*  --  HGB 13.8 13.4 13.6 12.8*  HCT 44.3 43.6 41.5 39.0  MCV 92.7 92.8 88.7 88.6  PLT 187 180 199 185   Cardiac Enzymes:  Recent Labs Lab 04/18/13 2012  TROPONINI <0.30   BNP (last 3 results)  Recent Labs  04/18/13 2012  PROBNP 50.7   CBG:  Recent Labs Lab 04/19/13 0704 04/19/13 1152 04/19/13 1638 04/19/13 2202 04/20/13 0722  GLUCAP 267* 281* 188* 217* 241*   Studies: Dg Chest Port 1 View  04/18/2013   *RADIOLOGY REPORT*  Clinical Data: Shortness of breath  PORTABLE CHEST - 1 VIEW  Comparison: 08/26/2008  Findings: Chronic interstitial markings.  No focal consolidation or frank interstitial edema. No pleural effusion or pneumothorax.  Cardiomegaly.  IMPRESSION: No evidence of acute cardiopulmonary disease.  Chronic interstitial markings.   Original  Report Authenticated By: Julian Hy, M.D.    Scheduled Meds: . antiseptic oral rinse  15 mL Mouth Rinse QID  . chlorhexidine  15 mL Mouth/Throat BID  . dextromethorphan-guaiFENesin  1 tablet Oral BID  . insulin aspart  0-20 Units Subcutaneous TID WC  . insulin aspart  2 Units Subcutaneous TID WC  . insulin glargine  54 Units Subcutaneous Q24H  . ipratropium  0.5 mg Nebulization Q4H  . levalbuterol  0.63 mg Nebulization Q4H  . levofloxacin (LEVAQUIN) IV  500 mg Intravenous Q24H  . methylPREDNISolone (SOLU-MEDROL) injection  60 mg Intravenous Q6H  . metoprolol  50 mg Oral BID  . simvastatin  20 mg Oral QHS  . sodium chloride  3 mL Intravenous Q12H  . traZODone  150 mg Oral QHS  . ziprasidone  80 mg Oral QPM   Continuous Infusions: . pantoprozole (PROTONIX) infusion 8 mg/hr (04/20/13 0600)    Active Problems:   HYPERTENSION   COPD exacerbation   Paranoid schizophrenia   CAD (coronary artery disease)  Time spent: [place], MD [facility] Pager [phone]. If 7 PM - 7 AM, please contact night-coverage at [email or web address], password [facility] 04/20/2013, 10:12 AM  LOS: 2 days

## 2013-04-20 NOTE — Progress Notes (Signed)
Pt was confused earlier and combative and has finally started resting. Neb held at this time to help pt stay sedated and calm. Nurse notified. Not wearing CPAP because of nausea on 6lpm/[place] SAT 97.

## 2013-04-20 NOTE — Anesthesia Preprocedure Evaluation (Addendum)
Anesthesia Evaluation  Patient identified by MRN, date of birth, ID band Patient awake    Reviewed: Allergy & Precautions, H&P , NPO status , Patient's Chart, lab work & pertinent test results, reviewed documented beta blocker date and time   Airway Mallampati: II TM Distance: >3 FB     Dental  (+) Poor Dentition   Pulmonary sleep apnea and Continuous Positive Airway Pressure Ventilation , COPD Sat 90% on 2L/M [place], mildy SOB with occas prod cough. + rhonchi         Cardiovascular hypertension, Pt. on medications and Pt. on home beta blockers + CAD and +CHF Rhythm:Regular Rate:Tachycardia     Neuro/Psych PSYCHIATRIC DISORDERS Depression Schizophrenia    GI/Hepatic   Endo/Other  diabetes, Type 2, Insulin DependentMorbid obesity  Renal/GU      Musculoskeletal   Abdominal   Peds  Hematology   Anesthesia Other Findings   Reproductive/Obstetrics                          Anesthesia Physical Anesthesia Plan  ASA: III  Anesthesia Plan: MAC   Post-op Pain Management:    Induction: Intravenous  Airway Management Planned: Simple Face Mask  Additional Equipment:   Intra-op Plan:   Post-operative Plan:   Informed Consent: I have reviewed the patients History and Physical, chart, labs and discussed the procedure including the risks, benefits and alternatives for the proposed anesthesia with the patient or authorized representative who has indicated his/her understanding and acceptance.     Plan Discussed with:   Anesthesia Plan Comments: (Pt and wife agree with plan to sedate with propofol as long as O2 sats and respiration remain adequate. Lopressor will be given preop as last dose was last night.)       Anesthesia Quick Evaluation

## 2013-04-20 NOTE — Preoperative (Signed)
Beta Blockers   Reason not to administer Beta Blockers:Not Applicable 

## 2013-04-20 NOTE — Progress Notes (Signed)
Notified md of pt becoming confused and aggressive with staff after receiving phenergan. Added phenergan to allergy list

## 2013-04-20 NOTE — Transfer of Care (Signed)
Immediate Anesthesia Transfer of Care Note  Patient: Rick Cruz  Procedure(s) Performed: Procedure(s): ESOPHAGOGASTRODUODENOSCOPY (EGD) WITH PROPOFOL (N/A)  Patient Location: PACU  Anesthesia Type:MAC  Level of Consciousness: awake, alert  and oriented  Airway & Oxygen Therapy: Patient Spontanous Breathing and Patient connected to face mask oxygen  Post-op Assessment: Report given to PACU RN and Post -op Vital signs reviewed and stable  Post vital signs: Reviewed and stable  Complications: No apparent anesthesia complications

## 2013-04-20 NOTE — Progress Notes (Signed)
Pt place on CPAP 9cm with 4lpm o2 bleed in pt tolerate well will continue to monitor through out the night.

## 2013-04-21 DIAGNOSIS — E875 Hyperkalemia: Secondary | ICD-10-CM

## 2013-04-21 DIAGNOSIS — E119 Type 2 diabetes mellitus without complications: Secondary | ICD-10-CM

## 2013-04-21 DIAGNOSIS — Z794 Long term (current) use of insulin: Secondary | ICD-10-CM | POA: Diagnosis present

## 2013-04-21 LAB — CBC
MCH: 28.2 pg (ref 26.0–34.0)
MCHC: 31.4 g/dL (ref 30.0–36.0)
MCV: 89.8 fL (ref 78.0–100.0)
Platelets: 206 10*3/uL (ref 150–400)
RDW: 12.3 % (ref 11.5–15.5)
WBC: 17.1 10*3/uL — ABNORMAL HIGH (ref 4.0–10.5)

## 2013-04-21 LAB — BASIC METABOLIC PANEL
Calcium: 9.4 mg/dL (ref 8.4–10.5)
Chloride: 92 mEq/L — ABNORMAL LOW (ref 96–112)
Creatinine, Ser: 0.79 mg/dL (ref 0.50–1.35)
GFR calc Af Amer: >90 mL/min (ref >90)
GFR calc non Af Amer: >90 mL/min (ref >90)

## 2013-04-21 LAB — GLUCOSE, CAPILLARY: Glucose-Capillary: 134 mg/dL — ABNORMAL HIGH (ref 70–99)

## 2013-04-21 MED ORDER — MORPHINE SULFATE 2 MG/ML IJ SOLN
1.0000 mg | INTRAMUSCULAR | Status: DC | PRN
  Administered 2013-04-21 – 2013-04-23 (×10): 1 mg via INTRAVENOUS
  Filled 2013-04-21 (×10): qty 1

## 2013-04-21 MED ORDER — PHENOL 1.4 % MT LIQD
1.0000 | OROMUCOSAL | Status: DC | PRN
  Administered 2013-04-22: 1 via OROMUCOSAL
  Filled 2013-04-21: qty 177

## 2013-04-21 MED ORDER — PREDNISONE 20 MG PO TABS
30.0000 mg | ORAL_TABLET | Freq: Two times a day (BID) | ORAL | Status: DC
  Administered 2013-04-21 – 2013-04-22 (×3): 30 mg via ORAL
  Filled 2013-04-21 (×3): qty 1

## 2013-04-21 MED ORDER — SODIUM POLYSTYRENE SULFONATE 15 GM/60ML PO SUSP
30.0000 g | Freq: Once | ORAL | Status: AC
  Administered 2013-04-21: 30 g via ORAL
  Filled 2013-04-21: qty 120

## 2013-04-21 MED ORDER — LEVOFLOXACIN 750 MG PO TABS
750.0000 mg | ORAL_TABLET | Freq: Every day | ORAL | Status: DC
  Administered 2013-04-21 – 2013-04-23 (×3): 750 mg via ORAL
  Filled 2013-04-21 (×3): qty 1

## 2013-04-21 NOTE — Progress Notes (Signed)
Upon entering pt room to do treatment pt requested to remove CPAP machine I informed pt it was just 3 am and he said that the machine was giving him a headache and he wanted something to eat. I informed nurse and pt put back on 4lpm cann

## 2013-04-21 NOTE — Care Management Note (Signed)
    Page 1 of 2   04/23/2013     11:13:28 AM   CARE MANAGEMENT NOTE 04/23/2013  Patient:  Rick Cruz, Rick Cruz   Account Number:  [id number]  Date Initiated:  04/21/2013  Documentation initiated by:  Theophilus Kinds  Subjective/Objective Assessment:   Pt admitted from home with COPD. Pt lives with his wife and will return home at discharge. Pt has home O2 and cpap machine for home use but admits that cpap is broken.     Action/Plan:   Cm encouraged pt to call company he got cpap from to have it serviced. Pt is agreeable to [facility] RN. Will continue to follow for discharge planning.   Anticipated DC Date:  04/23/2013   Anticipated DC Plan:  [place]  CM consult      Choice offered to / List presented to:          [facility] arranged  HH-1 RN  [place]      [place].   Status of service:  Completed, signed off Medicare Important Message given?  YES (If response is "NO", the following Medicare IM given date fields will be blank) Date Medicare IM given:  04/23/2013 Date Additional Medicare IM given:    Discharge Disposition:  [place]  Per UR Regulation:    If discussed at Long Length of Stay Meetings, dates discussed:   04/23/2013    Comments:  04/23/13 Claretha Cooper RN BSN CM Spoke to wife who is going out of town with their son for health reasons. She would like [facility] for RN and aid. States O2 tank is empty. CM called [facility] who will bring a portable O2 canister to hospital for his DC. Mother of pt will come to get him. Requested MD to call wife.  04/22/13 Keandre Linden RN BSN CM Spoke with pt and he states his home CPAP gives him a Headache. The CPAP last night did the same thing. Resp Therapist consulted and this could possibly be caused by mask too tight, not changing water and clearing tubings. CM placed call and lm for wife to call and discuss [facility] RN at  discharge.  04/21/13 [place], RN BSN CM

## 2013-04-21 NOTE — Progress Notes (Signed)
Subjective: Denies abdominal pain, further nausea or vomiting. States his "throat is sore". Tremors noted upper extremities, patient states is chronic.   Objective: Vital signs in last 24 hours: Temp:  [97.8 F (36.6 C)-98.3 F (36.8 C)] 98.1 F (36.7 C) (08/05 0400) Pulse Rate:  [82-103] 93 (08/05 0600) Resp:  [11-32] 11 (08/05 0600) BP: (132-191)/(70-145) 172/119 mmHg (08/05 0600) SpO2:  [89 %-100 %] 94 % (08/05 0700) Last BM Date: 04/18/13 General:   Alert and oriented, pleasant Head:  Normocephalic and atraumatic. Eyes:  No icterus, sclera clear. Conjuctiva pink.  Heart:  S1, S2 present Lungs: scattered rhonchi Abdomen:  Bowel sounds present, soft, obese, large AP diameter, unable to appreciate HSM due to large body habitus   Intake/Output from previous day: 08/04 0701 - 08/05 0700 In: 1118.3 [P.O.:850; I.V.:168.3; IV Piggyback:100] Out: 2200 [Urine:2200] Intake/Output this shift:    Lab Results:  Recent Labs  04/20/13 0832 04/20/13 1739 04/21/13 0450  WBC 18.5* 21.5* 17.1*  HGB 12.8* 13.5 13.6  HCT 39.0 41.5 43.3  PLT 185 209 206   BMET  Recent Labs  04/20/13 0136 04/20/13 0832 04/21/13 0450  NA 125* 126* 132*  K 5.0 4.8 5.2*  CL 84* 85* 92*  CO2 36* 34* 35*  GLUCOSE 251* 253* 241*  BUN 16 17 16   CREATININE 0.75 0.77 0.79  CALCIUM 9.4 9.1 9.4   LFT  Recent Labs  04/18/13 2012 04/19/13 0511  PROT 7.1 6.9  ALBUMIN 3.6 3.5  AST 14 11  ALT 21 19  ALKPHOS 83 80  BILITOT 0.3 0.3   PT/INR  Recent Labs  04/18/13 2012  LABPROT 12.4  INR 0.94    Assessment: 67 year old male admitted with COPD exacerbation and subsequently developed hematemesis after repetitive vomiting. EGD this admission with severe erosive reflux esophagitis, likely the culprit. Clinically improved from GI standpoint.  Hyperkalemia: per attending    Plan: BID PPI Short-course of Carafate for 5-7 days Advance diet as tolerated Follow peripherally  Orvil Feil,  ANP-BC [facility]  8:34 AM   LOS: 3 days    04/21/2013,

## 2013-04-21 NOTE — Progress Notes (Addendum)
Rick Cruz [id number] DOB: June 21, 1946 DOA: 04/18/2013 PCP: [name], MD  HPI: 67 year old male with a history of COPD, on home oxygen, diabetes mellitus, paranoid schizophrenia, obstructive sleep apnea who was brought to the hospital after patient has been having worsening shortness of breath. Patient is still smoking cigarettes, he smokes 1 pack per day. He has been coughing up phlegm. No fever. No nausea vomiting or diarrhea.  Interval events Patient was admitted on telemetry floor with a diagnosis of COPD exacerbation, and was started on IV steroids, antibiotics and breathing treatments. On 04/19/2013 overnight, patient experienced nausea with vomiting with coffee-ground material. He was subsequently transferred to the step down unit, and gastroenterology has been consulted. He underwent an EGD on 04/20/2013 that showed severe erosive reflux esophagitis and he was started on twice daily PPI therapy. His hemoglobin has been stable 2 days, and required no blood transfusions. On 04/21/2013, patient has been transferred back on telemetry floor. His breathing status has improved, still has some wheezing, and on 04/21/2013 he was transitioned to by mouth steroids and by mouth antibiotics.  Assessment/Plan: Coffee ground emesis - status post EGD yesterday. No more emesis episodes. Tolerating PPI well. Hemoglobin stable. COPD exacerbation- mild improvement, continue steroids, antibiotics, nebulizer. Transition to by mouth regimen today. I've asked the patient to try to ambulate as much as he can and see how he feels. At home he is able to walk around the house without shortness or breath. He is on 3.5 L of oxygen at home. He walks 10-20 yards at a time without any problems at home. He doesn't leave his house much at baseline. Acute on chronic respiratory failure - due to COPD DM - poorly controlled sugars for now since he was started on steroids. Will increase mealtime  coverage. Hyperkalemia - unclear etiology, Geodon can cause hyperkalemia although very rare, no EKG changes and patient asymptomatic, will probably need very close followup as an outpatient. Given Kayexalate twice now. Schizophrenia - continue home medications Hyperkalemia - improved.  DVT prophylaxis - SCDs  Code Status: presumed full Family Communication: none  Disposition Plan: home when medically ready, hopefully 1-2 days  Consultants:  GI  Procedures:  EGD 04/20/2013 IMPRESSION: Severe erosive reflux esophagitis - likely secondary  to "un-masked" gastroparesis in the setting of acute illness.  RECOMMENDATIONS: Twice daily proton pump inhibitor therapy. Add  Carafate suspension. Clear liquid diet.  Anti-infectives   Start     Dose/Rate Route Frequency Ordered Stop   04/21/13 1000  levofloxacin (LEVAQUIN) tablet 750 mg     750 mg Oral Daily 04/21/13 0828     04/19/13 0106  levofloxacin (LEVAQUIN) IVPB 500 mg  Status:  Discontinued     500 mg 100 mL/hr over 60 Minutes Intravenous Every 24 hours 04/19/13 0106 04/21/13 0828     Antibiotics Given (last 72 hours)   Date/Time Action Medication Dose Rate   04/19/13 0248 Given   levofloxacin (LEVAQUIN) IVPB 500 mg 500 mg 100 mL/hr   04/20/13 0301 Given   levofloxacin (LEVAQUIN) IVPB 500 mg 500 mg 100 mL/hr   04/21/13 0300 Given   levofloxacin (LEVAQUIN) IVPB 500 mg 500 mg 100 mL/hr      HPI/Subjective: - feeling much improved  Objective: Filed Vitals:   04/21/13 0400 04/21/13 0500 04/21/13 0600 04/21/13 0700  BP: 132/98 156/109 172/119   Pulse: 98 102 93   Temp: 98.1 F (36.7 C)     TempSrc: Oral  Resp: 13 13 11    Height:      Weight:      SpO2: 96% 97% 93% 94%    Intake/Output Summary (Last 24 hours) at 04/21/13 0829 Last data filed at 04/21/13 0600  Gross per 24 hour  Intake 1118.33 ml  Output   2200 ml  Net -1081.67 ml   Filed Weights   04/18/13 1941 04/19/13 0105  Weight: 131.543 kg (290 lb) 136.079  kg (300 lb)    Exam:   General:  No acute distress at rest, appears somewhat with labored breathing at baseline  Cardiovascular: regular rate and rhythm, without MRG  Respiratory: Diffuse wheezing throughout bilateral lung fields still, moves air well, unchanged much since admission.  Abdomen: soft, not tender to palpation, positive bowel sounds  MSK: no peripheral edema  Neuro: CN 2-12 grossly intact, MS 5/5 in all 4  Data Reviewed: Basic Metabolic Panel:  Recent Labs Lab 04/18/13 2012 04/19/13 0511 04/19/13 1615 04/20/13 0136 04/20/13 0832 04/21/13 0450  NA 133* 129*  --  125* 126* 132*  K 4.5 5.8* 4.5 5.0 4.8 5.2*  CL 92* 89*  --  84* 85* 92*  CO2 36* 33*  --  36* 34* 35*  GLUCOSE 194* 292*  --  251* 253* 241*  BUN 9 11  --  16 17 16   CREATININE 0.87 0.80  --  0.75 0.77 0.79  CALCIUM 9.5 9.3  --  9.4 9.1 9.4   Liver Function Tests:  Recent Labs Lab 04/18/13 2012 04/19/13 0511  AST 14 11  ALT 21 19  ALKPHOS 83 80  BILITOT 0.3 0.3  PROT 7.1 6.9  ALBUMIN 3.6 3.5   CBC:  Recent Labs Lab 04/18/13 2012 04/19/13 0511 04/20/13 0136 04/20/13 0832 04/20/13 1739 04/21/13 0450  WBC 14.7* 13.1* 21.2* 18.5* 21.5* 17.1*  NEUTROABS 11.4*  --  20.2*  --   --   --   HGB 13.8 13.4 13.6 12.8* 13.5 13.6  HCT 44.3 43.6 41.5 39.0 41.5 43.3  MCV 92.7 92.8 88.7 88.6 88.9 89.8  PLT 187 180 199 185 209 206   Cardiac Enzymes:  Recent Labs Lab 04/18/13 2012  TROPONINI <0.30   BNP (last 3 results)  Recent Labs  04/18/13 2012  PROBNP 50.7   CBG:  Recent Labs Lab 04/20/13 1033 04/20/13 1245 04/20/13 1702 04/20/13 2131 04/21/13 0734  GLUCAP 234* 212* 170* 194* 258*   Studies: No results found.  Scheduled Meds: . antiseptic oral rinse  15 mL Mouth Rinse QID  . chlorhexidine  15 mL Mouth/Throat BID  . dextromethorphan-guaiFENesin  1 tablet Oral BID  . insulin aspart  0-20 Units Subcutaneous TID WC  . insulin aspart  2 Units Subcutaneous TID WC  .  insulin glargine  54 Units Subcutaneous Q24H  . ipratropium  0.5 mg Nebulization Q4H  . levalbuterol  0.63 mg Nebulization Q4H  . levofloxacin  750 mg Oral Daily  . metoprolol  50 mg Oral BID  . pantoprazole (PROTONIX) IV  40 mg Intravenous Q12H  . predniSONE  30 mg Oral BID WC  . simvastatin  20 mg Oral QHS  . sodium chloride  3 mL Intravenous Q12H  . sodium polystyrene  30 g Oral Once  . sucralfate  1 g Oral TID WC & HS  . traZODone  150 mg Oral QHS  . ziprasidone  80 mg Oral QPM   Continuous Infusions:    Active Problems:   HYPERTENSION   COPD exacerbation  Paranoid schizophrenia   CAD (coronary artery disease)   DM (diabetes mellitus)  Time spent: [place], MD [facility] Pager [phone]. If 7 PM - 7 AM, please contact night-coverage at [email or web address], password [facility] 04/21/2013, 8:29 AM  LOS: 3 days

## 2013-04-21 NOTE — Progress Notes (Signed)
REVIEWED.  

## 2013-04-21 NOTE — Progress Notes (Signed)
Oxygen increased to 6L bleed in with CPAP due to pts O2 sats 82%.  O2 sat now 92%, RT will continue to monitor.

## 2013-04-21 NOTE — Clinical Documentation Improvement (Signed)
THIS DOCUMENT IS NOT A PERMANENT PART OF THE MEDICAL RECORD  Please update your documentation with the medical record to reflect your response to this query. If you need help knowing how to do this please call [phone].  04/21/13  Dear Dr. Cruzita Lederer Rolley Sims,  A review of the patient medical record has revealed the following indicators.  Based on your clinical judgment, please clarify and document in a progress note and/or discharge summary the clinical condition associated with the following supporting information:  Admitted with COPD exacerbation History of COPD w/ Home O2@3 .5L/m via [place] ...brought to the hospital after patient has been having worsening shortness of breath Wheezing  Labs  CO2  8/2 =36 8/3 = 33 8/4 = 36 8/5 = 35  ABG 04/19/13 on 6L/m [place] pH 7.378 pCO2 57.6 pO2 69.4 HCO3 33.1  8/2 Resp 16-24 on 2-3L [place] with sats 96-97% 8/3 Resp 20-22 on 2-6L with sats 63-95% 8/4 Resp 14-32 on 4-10L with sats 84-100% 8/5 Resp 11-22 on 3-4L with sats 84-100%   Treatments CPAP Atrovent & Xopenex nebs q4hs Prednisone 30mg  bid Continuous Pulse Ox Keep O2 sats >92%   Possible Clinical Conditions?  Acute Respiratory Failure Acute on Chronic Respiratory Failure Chronic Respiratory Failure Other Condition________________ Cannot Clinically Determine     You may use possible, probable, or suspect with inpatient documentation. possible, probable, suspected diagnoses MUST be documented at the time of discharge  Reviewed: additional documentation in the medical record  Thank Markus Daft RN Clinical Documentation Specialist: [phone] [place]

## 2013-04-21 NOTE — Progress Notes (Signed)
Pt on CPAP on 9 with 4lpm bleed in  No distress tolerate well will monitor through out the night

## 2013-04-21 NOTE — Anesthesia Postprocedure Evaluation (Signed)
  Anesthesia Post-op Note  Patient: Rick Cruz  Procedure(s) Performed: Procedure(s): ESOPHAGOGASTRODUODENOSCOPY (EGD) WITH PROPOFOL (N/A)  Patient Location: ICU  Anesthesia Type:MAC  Level of Consciousness: awake, alert , oriented and patient cooperative  Airway and Oxygen Therapy: Patient Spontanous Breathing  Post-op Pain: none  Post-op Assessment: Post-op Vital signs reviewed, Patient's Cardiovascular Status Stable, PATIENT'S CARDIOVASCULAR STATUS UNSTABLE, Patent Airway, No signs of Nausea or vomiting and Pain level controlled  Post-op Vital Signs: Reviewed and stable  Complications: No apparent anesthesia complications

## 2013-04-21 NOTE — Progress Notes (Signed)
Inpatient Diabetes Program Recommendations  AACE/ADA: New Consensus Statement on Inpatient Glycemic Control (2013)  Target Ranges:  Prepandial:   less than 140 mg/dL      Peak postprandial:   less than 180 mg/dL (1-2 hours)      Critically ill patients:  140 - 180 mg/dL   Results for DARRYLL, FREIMARK (MRN [id number]) as of 04/21/2013 08:10  Ref. Range 04/20/2013 07:22 04/20/2013 10:33 04/20/2013 12:45 04/20/2013 17:02 04/20/2013 21:31 04/21/2013 07:34  Glucose-Capillary Latest Range: 70-99 mg/dL 241 (H) 234 (H) 212 (H) 170 (H) 194 (H) 258 (H)   Inpatient Diabetes Program Recommendations Insulin - Basal: Please consider increasing Lantus to 56 units Q24 hours. Insulin - Meal Coverage: Please consider increasing Novolog meal coverage to 5 units TID with meals.  Note: Noted that patient was NPO for EGD yesterday and was ordered clear liquids after procedure.  Blood glucose has ranged from 170-258 mg/dl over the past 24 hours and fasting blood glucose this morning was 258 mg/dl.  Patient is ordered Solumedrol 60mg  IV Q6H which is contributing to hyperglycemia.  Please consider increasing Lantus to 56 units Q24 hours and increasing Novolog meal coverage to 5 units TID with meals.  Will continue to follow.  Thanks, Barnie Alderman, RN, MSN, CCRN Diabetes Coordinator Inpatient Diabetes Program [phone]

## 2013-04-22 ENCOUNTER — Encounter (HOSPITAL_COMMUNITY): Payer: Self-pay | Admitting: Internal Medicine

## 2013-04-22 DIAGNOSIS — E875 Hyperkalemia: Secondary | ICD-10-CM

## 2013-04-22 DIAGNOSIS — K2210 Ulcer of esophagus without bleeding: Secondary | ICD-10-CM

## 2013-04-22 DIAGNOSIS — J9610 Chronic respiratory failure, unspecified whether with hypoxia or hypercapnia: Secondary | ICD-10-CM

## 2013-04-22 DIAGNOSIS — J9620 Acute and chronic respiratory failure, unspecified whether with hypoxia or hypercapnia: Principal | ICD-10-CM

## 2013-04-22 DIAGNOSIS — R0902 Hypoxemia: Secondary | ICD-10-CM

## 2013-04-22 LAB — GLUCOSE, CAPILLARY
Glucose-Capillary: 100 mg/dL — ABNORMAL HIGH (ref 70–99)
Glucose-Capillary: 161 mg/dL — ABNORMAL HIGH (ref 70–99)

## 2013-04-22 LAB — BASIC METABOLIC PANEL
Chloride: 93 mEq/L — ABNORMAL LOW (ref 96–112)
GFR calc Af Amer: >90 mL/min (ref >90)
Potassium: 3.9 mEq/L (ref 3.5–5.1)
Sodium: 135 mEq/L (ref 135–145)

## 2013-04-22 MED ORDER — PREDNISONE 20 MG PO TABS
30.0000 mg | ORAL_TABLET | Freq: Every day | ORAL | Status: DC
  Administered 2013-04-23: 30 mg via ORAL
  Filled 2013-04-22: qty 1

## 2013-04-22 MED ORDER — PANTOPRAZOLE SODIUM 40 MG PO TBEC
40.0000 mg | DELAYED_RELEASE_TABLET | Freq: Two times a day (BID) | ORAL | Status: DC
  Administered 2013-04-22 – 2013-04-23 (×2): 40 mg via ORAL
  Filled 2013-04-22 (×2): qty 1

## 2013-04-22 NOTE — Progress Notes (Signed)
[facility] PROGRESS NOTE  Rick Cruz [id number] DOB: 1945-10-04 DOA: 04/18/2013 PCP: [name], MD  Assessment/Plan: 1. COPD exacerbation: Appears to be near baseline this point. Continue nebulizers, finish antibiotics. Continue chronic oxygen therapy. 2. Hematemesis: Secondary to severe erosive reflux esophagitis. Continue twice a day PPI. Short course of Carafate for 5-7 days total. 3. Diabetes mellitus type 2: Stable. Continue Lantus, meal coverage, sliding scale insulin. 4. Hyperkalemia: Etiology unclear. Resolved. Status post Kayexalate 8/5 in the morning. This has also been seen in the past. Repeat basic metabolic panel in the morning. Not on potassium supplementation or ACE inhibitor. 5. Chronic hypoxic, hypercapnic respiratory failure: Appears stable at this point. 6. Hyponatremia: Etiology unclear. Currently resolved. 7. Depression, schizophrenia: Continue Geodon 8. Cigarette smoker:Recommend cessation.  9. Obstructive sleep apnea:Continue CPAP.   Wean steroids   Advance diet    check basic metabolic panel morning  Anticipate discharge 8/7  Code Status: Full code  DVT prophylaxis: SCDs  Family Communication: None present  Disposition Plan: As above   Murray Hodgkins, MD  [facility]  Pager [phone] If 7PM-7AM, please contact night-coverage at [email or web address], password [facility] 04/22/2013, 6:22 PM  LOS: 4 days   Clinical Summary: 67 year old man with history of oxygen-dependent COPD presented to emergency department with increasing shortness of breath. Admitted for COPD exacerbation.   He was treated empirically with Levaquin and routine treatments for COPD. Early in his hospitalization developed hematemesis, aspirin Plavix. Discontinued, patient seen by gastroenterology. Underwent EGD which revealed severe reflux esophagitis. Hemoglobin remained stable and there were no further episodes.  Consultants:  Gastroenterology  Procedures:  EGD: Severe  erosive reflux esophagitis.  2-D echocardiogram:Left ventricular ejection fraction 65-70%. Grade 1 diastolic dysfunction.  Antibiotics:  Levaquin 8/3 >> 8/7  HPI/Subjective: Overall feeling much better. Breathing better. No bleeding. Tolerating liquids.   Objective: Filed Vitals:   04/22/13 0656 04/22/13 1038 04/22/13 1123 04/22/13 1438  BP:  125/70    Pulse:  84    Temp:      TempSrc:      Resp:      Height:      Weight:      SpO2: 93%  94% 94%    Intake/Output Summary (Last 24 hours) at 04/22/13 1822 Last data filed at 04/22/13 1800  Gross per 24 hour  Intake    840 ml  Output   2400 ml  Net  -1560 ml     Filed Weights   04/18/13 1941 04/19/13 0105 04/22/13 0449  Weight: 131.543 kg (290 lb) 136.079 kg (300 lb) 128.6 kg (283 lb 8.2 oz)    Exam:, Hypercapnic   Afebrile, vital signs stable. Hypoxia stable.  General: Appears calm and comfortable. Speech fluent and clear.  Cardiovascular: Regular rate and rhythm. No murmur, rub, gallop.  Respiratory: Clear to auscultation bilaterally but breath sounds diminished, fair air movement. No wheezes, rales, rhonchi. Mild increased respiratory effort.  Data Reviewed:  Capillary blood sugars well-controlled  Potassium normal 3.5. Sodium normal 135   Pending studies:   None   Scheduled Meds: . antiseptic oral rinse  15 mL Mouth Rinse QID  . chlorhexidine  15 mL Mouth/Throat BID  . dextromethorphan-guaiFENesin  1 tablet Oral BID  . insulin aspart  0-20 Units Subcutaneous TID WC  . insulin aspart  2 Units Subcutaneous TID WC  . insulin glargine  54 Units Subcutaneous Q24H  . ipratropium  0.5 mg Nebulization Q4H  . levalbuterol  0.63 mg Nebulization Q4H  . levofloxacin  750 mg Oral Daily  . metoprolol  50 mg Oral BID  . pantoprazole (PROTONIX) IV  40 mg Intravenous Q12H  . predniSONE  30 mg Oral BID WC  . simvastatin  20 mg Oral QHS  . sodium chloride  3 mL Intravenous Q12H  . sucralfate  1 g Oral TID WC & HS   . traZODone  150 mg Oral QHS  . ziprasidone  80 mg Oral QPM   Continuous Infusions:   Principal Problem:   COPD exacerbation Active Problems:   HYPERTENSION   Paranoid schizophrenia   CAD (coronary artery disease)   DM (diabetes mellitus)   Erosive esophagitis   Hyperkalemia   Acute and chronic respiratory failure with hypoxia   Time spent 35  minutes

## 2013-04-23 LAB — GLUCOSE, CAPILLARY: Glucose-Capillary: 125 mg/dL — ABNORMAL HIGH (ref 70–99)

## 2013-04-23 LAB — BASIC METABOLIC PANEL
CO2: 35 mEq/L — ABNORMAL HIGH (ref 19–32)
Calcium: 9.1 mg/dL (ref 8.4–10.5)
Chloride: 93 mEq/L — ABNORMAL LOW (ref 96–112)
Creatinine, Ser: 0.78 mg/dL (ref 0.50–1.35)
Glucose, Bld: 205 mg/dL — ABNORMAL HIGH (ref 70–99)
Sodium: 133 mEq/L — ABNORMAL LOW (ref 135–145)

## 2013-04-23 MED ORDER — IPRATROPIUM BROMIDE 0.02 % IN SOLN
0.5000 mg | Freq: Four times a day (QID) | RESPIRATORY_TRACT | Status: DC
  Administered 2013-04-23: 0.5 mg via RESPIRATORY_TRACT
  Filled 2013-04-23: qty 2.5

## 2013-04-23 MED ORDER — SUCRALFATE 1 GM/10ML PO SUSP: ORAL | Status: DC

## 2013-04-23 MED ORDER — PREDNISONE 10 MG PO TABS: ORAL_TABLET | ORAL | Status: DC

## 2013-04-23 MED ORDER — LEVALBUTEROL HCL 0.63 MG/3ML IN NEBU
0.6300 mg | INHALATION_SOLUTION | Freq: Four times a day (QID) | RESPIRATORY_TRACT | Status: DC
  Administered 2013-04-23: 0.63 mg via RESPIRATORY_TRACT
  Filled 2013-04-23: qty 3

## 2013-04-23 MED ORDER — PANTOPRAZOLE SODIUM 40 MG PO TBEC: 40.0000 mg | DELAYED_RELEASE_TABLET | Freq: Two times a day (BID) | ORAL | Status: DC

## 2013-04-23 MED ORDER — ALBUTEROL SULFATE HFA 108 (90 BASE) MCG/ACT IN AERS: 2.0000 | INHALATION_SPRAY | Freq: Four times a day (QID) | RESPIRATORY_TRACT | Status: DC | PRN

## 2013-04-23 NOTE — Discharge Summary (Signed)
Physician Discharge Summary  Rick Cruz [id number] DOB: 06-Dec-1945 DOA: 04/18/2013  PCP: [name], MD  Admit date: 04/18/2013 Discharge date: 04/23/2013  Recommendations for Outpatient Follow-up:  1. Follow-up resolution of COPD exacerbation. He has been prescribed a rescue inhaler. Consider Combivent or Spiriva as clinically indicated. Wife reports he is on Flovent at home. She does note that he is grossly noncompliant with his medications and continues to smoke. 2. Follow-up erosive reflux esophagitis, currently on PPI 3. Continue to encourage smoking cessation 4. Resume ASA, Plavix 8/12 5. Hold potassium, quinapril until follow-up with PCP 6. HH RN, [place] aide  Follow-up Information   Follow up with [name], MD In 1 week.   Contact information:   [address] [place] [address] [phone]      Discharge Diagnoses:  1. COPD exacerbation 2. Hematemesis secondary to severe erosive reflux esophagitis 3. DM type 2 4. Hyperkalemia, resolved 5. Chronic hypoxic, hypercapneic respiratory failure 6. Cigarette smoker 7. OSA  Discharge Condition: improved Disposition: improved  Diet recommendation: heart healthy diet  Filed Weights   04/18/13 1941 04/19/13 0105 04/22/13 0449  Weight: 131.543 kg (290 lb) 136.079 kg (300 lb) 128.6 kg (283 lb 8.2 oz)    History of present illness:  67 year old man with history of oxygen-dependent COPD presented to emergency department with increasing shortness of breath. Admitted for COPD exacerbation.   Hospital Course:  Mr. Palermo was admitted for treatment of COPD exacerbation. He was treated empirically with Levaquin and routine treatments for COPD. Early in his hospitalization developed hematemesis, and aspirin/Plavix were discontinued. Patient seen by gastroenterology and underwent EGD which revealed severe reflux esophagitis. Hemoglobin remained stable and there were no further episodes. He continues on PPI and  carafate. His respiratory status is at baseline. Other issues as below. Plan discharge home today with home health.  1. COPD exacerbation: Appears to be at baseline. Completes antibiotics today. Continue chronic oxygen therapy. Wean steroids. 2. Hematemesis: Secondary to severe erosive reflux esophagitis. Continue twice a day PPI. Short course of Carafate for 5-7 days total. Started 8/4. 3. Diabetes mellitus type 2: Stable. Continue Lantus, meal coverage, sliding scale insulin. 4. Hyperkalemia: Resolved. Status post Kayexalate 8/5 in the morning, no recurrence. This has also been seen in the past. Hold potassium supplementation and ACE inhibitor. Followup as an outpatient. 5. Chronic hypoxic, hypercapnic respiratory failure: Appears stable.  6. Hyponatremia: Etiology unclear. Likely secondary lung disease. Stable. Asymptomatic. 7. Depression, schizophrenia: Continue Geodon 8. Cigarette smoker: Recommend cessation.  9. Obstructive sleep apnea:Continue CPAP.  Consultants:  Gastroenterology Procedures:  EGD: Severe erosive reflux esophagitis.  2-D echocardiogram:Left ventricular ejection fraction 65-70%. Grade 1 diastolic dysfunction.   Discharge Instructions  Discharge Orders   Future Orders Complete By Expires     Diet - low sodium heart healthy  As directed     Discharge instructions  As directed     Comments:      Be sure to complete prednisone to decrease inflammation in your lungs. You have been prescribed Protonix to decrease inflammation of stomach. You may restart your aspirin and Plavix 8/12. Stop taking potassium and quinapril until followup with your primary care physician. Use albuterol inhaler as directed for shortness of breath, if no relief call 911.    Face-to-face encounter (required for Medicare/Medicaid patients)  As directed     Comments:      I Denise Bramblett certify that this patient is under my care and that I, or a nurse practitioner  or 21 assistant  working with me, had a face-to-face encounter that meets the physician face-to-face encounter requirements with this patient on 04/23/2013. The encounter with the patient was in whole, or in part for the following medical condition(s) which is the primary reason for home health care (List medical condition): COPD exacerbation , hematemesis    Questions:      The encounter with the patient was in whole, or in part, for the following medical condition, which is the primary reason for home health care:  COPD exacerbation , hematemesis    I certify that, based on my findings, the following services are medically necessary home health services:  Nursing    My clinical findings support the need for the above services:  Shortness of breath with activity    Further, I certify that my clinical findings support that this patient is homebound due to:  Shortness of Breath with activity    Reason for Medically Necessary Home Health Services:  Skilled Nursing- Changes in Medication/Medication Management    Home Health  As directed     Questions:      To provide the following care/treatments:  RN    Home Health Aide    Increase activity slowly  As directed         Medication List    STOP taking these medications       aspirin EC 81 MG tablet     clopidogrel 75 MG tablet  Commonly known as:  PLAVIX     potassium chloride SA 20 MEQ tablet  Commonly known as:  K-DUR,KLOR-CON     quinapril 40 MG tablet  Commonly known as:  ACCUPRIL      TAKE these medications       albuterol 108 (90 BASE) MCG/ACT inhaler  Commonly known as:  PROVENTIL HFA;VENTOLIN HFA  Inhale 2 puffs into the lungs every 6 (six) hours as needed for wheezing or shortness of breath.     Cinnamon 500 MG capsule  Take 500 mg by mouth daily.     Fish Oil 1000 MG Caps  Take 1,000 mg by mouth daily.     furosemide 40 MG tablet  Commonly known as:  LASIX  Take 40 mg by mouth every other day. Patient takes Mon.,Wed.,Fri.,      glipiZIDE 10 MG tablet  Commonly known as:  GLUCOTROL  Take 20 mg by mouth 2 (two) times daily before a meal.     HYDROcodone-acetaminophen 7.5-325 MG per tablet  Commonly known as:  NORCO  Take 1 tablet by mouth 4 (four) times daily as needed for pain.     LANTUS SOLOSTAR 100 UNIT/ML Sopn  Generic drug:  Insulin Glargine  Inject 54 Units into the skin every evening.     metFORMIN 1000 MG tablet  Commonly known as:  GLUCOPHAGE  Take 1,000 mg by mouth every evening.     metoprolol 50 MG tablet  Commonly known as:  LOPRESSOR  Take 50 mg by mouth 2 (two) times daily.     NOVOLOG FLEXPEN 100 UNIT/ML Sopn FlexPen  Generic drug:  insulin aspart  Inject 2 Units into the skin 3 (three) times daily with meals.     pantoprazole 40 MG tablet  Commonly known as:  PROTONIX  Take 1 tablet (40 mg total) by mouth 2 (two) times daily.     predniSONE 10 MG tablet  Commonly known as:  DELTASONE  8/8-8/9 Take 30 mg po daily. 8/10-8/12 take 20 mg by  mouth daily. 8/13-8/15 take 10 mg daily then stop.     simvastatin 40 MG tablet  Commonly known as:  ZOCOR  Take 40 mg by mouth every evening.     sucralfate 1 GM/10ML suspension  Commonly known as:  CARAFATE  Take 1 g by mouth with each meal and at bedtime. Dispense three-day supply.     traZODone 150 MG tablet  Commonly known as:  DESYREL  Take 300 mg by mouth at bedtime.     VICTOZA 18 MG/3ML Sopn  Generic drug:  Liraglutide  Inject 1.2 Units into the skin daily.     ziprasidone 20 MG capsule  Commonly known as:  GEODON  Take 20 mg by mouth daily.     ziprasidone 80 MG capsule  Commonly known as:  GEODON  Take 80 mg by mouth every evening.       Allergies  Allergen Reactions  . Phenergan (Promethazine Hcl) Other (See Comments)    Becomes very confused and aggressive    The results of significant diagnostics from this hospitalization (including imaging, microbiology, ancillary and laboratory) are listed below for reference.     Significant Diagnostic Studies: Dg Chest Port 1 View  04/18/2013   *RADIOLOGY REPORT*  Clinical Data: Shortness of breath  PORTABLE CHEST - 1 VIEW  Comparison: 08/26/2008  Findings: Chronic interstitial markings.  No focal consolidation or frank interstitial edema. No pleural effusion or pneumothorax.  Cardiomegaly.  IMPRESSION: No evidence of acute cardiopulmonary disease.  Chronic interstitial markings.   Original Report Authenticated By: Julian Hy, M.D.    Microbiology: Recent Results (from the past 240 hour(s))  MRSA PCR SCREENING     Status: None   Collection Time    04/20/13  1:17 AM      Result Value Range Status   MRSA by PCR NEGATIVE  NEGATIVE Final   Comment:            The GeneXpert MRSA Assay (FDA     approved for NASAL specimens     only), is one component of a     comprehensive MRSA colonization     surveillance program. It is not     intended to diagnose MRSA     infection nor to guide or     monitor treatment for     MRSA infections.     Labs: Basic Metabolic Panel:  Recent Labs Lab 04/20/13 0136 04/20/13 0832 04/21/13 0450 04/22/13 0550 04/23/13 0519  NA 125* 126* 132* 135 133*  K 5.0 4.8 5.2* 3.9 3.7  CL 84* 85* 92* 93* 93*  CO2 36* 34* 35* 37* 35*  GLUCOSE 251* 253* 241* 118* 205*  BUN 16 17 16 16 15   CREATININE 0.75 0.77 0.79 0.84 0.78  CALCIUM 9.4 9.1 9.4 9.2 9.1   Liver Function Tests:  Recent Labs Lab 04/18/13 2012 04/19/13 0511  AST 14 11  ALT 21 19  ALKPHOS 83 80  BILITOT 0.3 0.3  PROT 7.1 6.9  ALBUMIN 3.6 3.5   CBC:  Recent Labs Lab 04/18/13 2012 04/19/13 0511 04/20/13 0136 04/20/13 0832 04/20/13 1739 04/21/13 0450  WBC 14.7* 13.1* 21.2* 18.5* 21.5* 17.1*  NEUTROABS 11.4*  --  20.2*  --   --   --   HGB 13.8 13.4 13.6 12.8* 13.5 13.6  HCT 44.3 43.6 41.5 39.0 41.5 43.3  MCV 92.7 92.8 88.7 88.6 88.9 89.8  PLT 187 180 199 185 209 206   Cardiac Enzymes:  Recent Labs Lab 04/18/13  2012  TROPONINI <0.30      Recent Labs  04/18/13 2012  PROBNP 50.7   CBG:  Recent Labs Lab 04/22/13 0727 04/22/13 1138 04/22/13 1624 04/22/13 2122 04/23/13 0742  GLUCAP 100* 124* 157* 161* 139*    Principal Problem:   COPD exacerbation Active Problems:   HYPERTENSION   Paranoid schizophrenia   CAD (coronary artery disease)   DM (diabetes mellitus)   Erosive esophagitis   Hyperkalemia   Acute and chronic respiratory failure with hypoxia   Time coordinating discharge: 40 minutes  Signed:  Murray Hodgkins, MD [facility] 04/23/2013, 11:26 AM

## 2013-04-23 NOTE — Progress Notes (Signed)
Pt verbalizes understanding of d/c instructions, medication changes, and follow up appt needed with PCP in 1 wk. Pt has no questions at this time. IV was d/c. Pt d/c via wheelchair, accompanied by NT and his mother. Pt has prescriptions and information at time of d/c. Marry Guan

## 2013-04-23 NOTE — Progress Notes (Signed)
[facility] PROGRESS NOTE  CRIS LONERO [id number] DOB: 01-27-1946 DOA: 04/18/2013 PCP: [name], MD  Assessment/Plan: 1. COPD exacerbation: Appears to be at baseline. Completes antibiotics today. Continue chronic oxygen therapy. Wean steroids. 2. Hematemesis: Secondary to severe erosive reflux esophagitis. Continue twice a day PPI. Short course of Carafate for 5-7 days total. Started 8/4. 3. Diabetes mellitus type 2: Stable. Continue Lantus, meal coverage, sliding scale insulin. 4. Hyperkalemia: Resolved. Status post Kayexalate 8/5 in the morning, no recurrence. This has also been seen in the past. Not on potassium supplementation or ACE inhibitor. Followup as an outpatient. 5. Chronic hypoxic, hypercapnic respiratory failure: Appears stable.  6. Hyponatremia: Etiology unclear. Likely secondary lung disease. Stable. 7. Depression, schizophrenia: Continue Geodon 8. Cigarette smoker: Recommend cessation.  9. Obstructive sleep apnea:Continue CPAP.   Resume aspirin, Plavix in 5 days  Complete steroid taper  Will not restart potassium supplementation, quinapril until followup with primary care provider.  Code Status: Full code  DVT prophylaxis: SCDs  Family Communication: None present  Disposition Plan: As above   Murray Hodgkins, MD  [facility]  Pager [phone] If 7PM-7AM, please contact night-coverage at [email or web address], password [facility] 04/23/2013, 11:02 AM  LOS: 5 days   Clinical Summary: 67 year old man with history of oxygen-dependent COPD presented to emergency department with increasing shortness of breath. Admitted for COPD exacerbation.   He was treated empirically with Levaquin and routine treatments for COPD. Early in his hospitalization developed hematemesis, aspirin Plavix. Discontinued, patient seen by gastroenterology. Underwent EGD which revealed severe reflux esophagitis. Hemoglobin remained stable and there were no further  episodes.  Consultants:  Gastroenterology  Procedures:  EGD: Severe erosive reflux esophagitis.  2-D echocardiogram:Left ventricular ejection fraction 65-70%. Grade 1 diastolic dysfunction.  Antibiotics:  Levaquin 8/3 >> 8/7  HPI/Subjective: Continues to feel better. No nausea, vomiting or abdominal pain. Tolerating liquids. Breathing better. No hematemesis.  Objective: Filed Vitals:   04/22/13 2322 04/23/13 0540 04/23/13 0801 04/23/13 0846  BP:  152/82  144/87  Pulse: 85 79  90  Temp:  98.2 F (36.8 C)    TempSrc:  Oral    Resp: 17 16    Height:      Weight:      SpO2: 90% 90% 91%     Intake/Output Summary (Last 24 hours) at 04/23/13 1102 Last data filed at 04/23/13 0900  Gross per 24 hour  Intake    360 ml  Output   3775 ml  Net  -3415 ml     Filed Weights   04/18/13 1941 04/19/13 0105 04/22/13 0449  Weight: 131.543 kg (290 lb) 136.079 kg (300 lb) 128.6 kg (283 lb 8.2 oz)    Exam:   Afebrile, vital signs stable. Hypoxia stable.  General: Appears calm and comfortable. Speech fluent and clear.  Cardiovascular: Regular rate and rhythm. No murmur, rub, gallop. No significant lower extremity edema.  Respiratory: Clear to auscultation bilaterally. No wheezes, rales, rhonchi. Normal respiratory effort.  Psychiatric: Grossly normal mood and affect.  Data Reviewed:  Capillary blood sugars well-controlled  Potassium normal 3.7.  Pending studies:   None   Scheduled Meds: . antiseptic oral rinse  15 mL Mouth Rinse QID  . chlorhexidine  15 mL Mouth/Throat BID  . dextromethorphan-guaiFENesin  1 tablet Oral BID  . insulin aspart  0-20 Units Subcutaneous TID WC  . insulin aspart  2 Units Subcutaneous TID WC  . insulin glargine  54 Units Subcutaneous Q24H  . ipratropium  0.5 mg Nebulization Q6H  .  levalbuterol  0.63 mg Nebulization Q6H  . levofloxacin  750 mg Oral Daily  . metoprolol  50 mg Oral BID  . pantoprazole  40 mg Oral BID  . predniSONE  30  mg Oral Q breakfast  . simvastatin  20 mg Oral QHS  . sodium chloride  3 mL Intravenous Q12H  . sucralfate  1 g Oral TID WC & HS  . traZODone  150 mg Oral QHS  . ziprasidone  80 mg Oral QPM   Continuous Infusions:   Principal Problem:   COPD exacerbation Active Problems:   HYPERTENSION   Paranoid schizophrenia   CAD (coronary artery disease)   DM (diabetes mellitus)   Erosive esophagitis   Hyperkalemia   Acute and chronic respiratory failure with hypoxia

## 2013-04-24 NOTE — Progress Notes (Signed)
UR chart review completed.  

## 2013-05-06 ENCOUNTER — Ambulatory Visit (INDEPENDENT_AMBULATORY_CARE_PROVIDER_SITE_OTHER): Payer: Medicare Other | Admitting: Internal Medicine

## 2013-05-06 ENCOUNTER — Encounter: Payer: Self-pay | Admitting: Internal Medicine

## 2013-05-06 VITALS — BP 116/80 | HR 86 | Temp 98.3°F | Ht 72.0 in | Wt 290.0 lb

## 2013-05-06 DIAGNOSIS — J9610 Chronic respiratory failure, unspecified whether with hypoxia or hypercapnia: Secondary | ICD-10-CM

## 2013-05-06 DIAGNOSIS — J449 Chronic obstructive pulmonary disease, unspecified: Secondary | ICD-10-CM

## 2013-05-06 MED ORDER — PANTOPRAZOLE SODIUM 40 MG PO TBEC: DELAYED_RELEASE_TABLET | ORAL | Status: DC

## 2013-05-06 MED ORDER — ACLIDINIUM BROMIDE 400 MCG/ACT IN AEPB: 1.0000 | INHALATION_SPRAY | Freq: Two times a day (BID) | RESPIRATORY_TRACT | Status: DC

## 2013-05-06 NOTE — Patient Instructions (Addendum)
tudorza one twice daily Change protonix 40 mg Take 30- 60 min before your first and last meals of the day  Stop fish oil abd eat more salmon Change albuterol Only use your albuterol(always try ventolin first then the neb as a back up) as a rescue medication to be used if you can't catch your breath by resting or doing a relaxed purse lip breathing pattern. The less you use it, the better it will work when you need it.   GERD (REFLUX)  is an extremely common cause of respiratory symptoms, many times with no significant heartburn at all.    It can be treated with medication, but also with lifestyle changes including avoidance of late meals, excessive alcohol, smoking cessation, and avoid fatty foods, chocolate, peppermint, colas, red wine, and acidic juices such as orange juice.  NO MINT OR MENTHOL PRODUCTS SO NO COUGH DROPS  USE SUGARLESS CANDY INSTEAD (jolley ranchers or [employer])  NO OIL BASED VITAMINS - use powdered substitutes.   Please schedule a follow up office visit in 4 weeks, sooner if needed with pft's on return Late add needs tsh on return

## 2013-05-06 NOTE — Progress Notes (Signed)
Subjective:    Patient ID: Rick Cruz, male    DOB: [birth year]   MRN: [id number]  HPI  74 yowm quit smoking July 2014 dx with copd by PFT's 10/25/05 FEV1 0.70 but ratio was 68 referred 05/06/13 to pulmonary clinic by Dr Rick Cruz   05/06/2013 1st eval in EPIC era/ Rick Cruz cc progressive x 10 years with doe to point where best days across room on 02 on 3lpm sleeps in recliner x years and required admit:   Admit date: 04/18/2013  Discharge date: 04/23/2013  Recommendations for Outpatient Follow-up:  1. Follow-up resolution of COPD exacerbation. He has been prescribed a rescue inhaler. Consider Combivent or Spiriva as clinically indicated. Wife reports he is on Flovent at home. She does note that he is grossly noncompliant with his medications and continues to smoke. 2. Follow-up erosive reflux esophagitis, currently on PPI 3. Continue to encourage smoking cessation 4. Resume ASA, Plavix 8/12 5. Hold potassium, quinapril until follow-up with PCP 6. HH RN, Cherry aide Follow-up Information    Follow up with Rick Gravel, MD In 1 week.    Contact information:    [address] [place]  [place] [address]  [phone]     Discharge Diagnoses:  1. COPD exacerbation 2. Hematemesis secondary to severe erosive reflux esophagitis 3. DM type 2 4. Hyperkalemia, resolved 5. Chronic hypoxic, hypercapneic respiratory failure 6. Cigarette smoker 7. OSA Discharge Condition: improved  Disposition: improved  Diet recommendation: heart healthy diet  Filed Weights    04/18/13 1941  04/19/13 0105  04/22/13 0449   Weight:  131.543 kg (290 lb)  136.079 kg (300 lb)  128.6 kg (283 lb 8.2 oz)   History of present illness:  67 year old man with history of oxygen-dependent COPD presented to emergency department with increasing shortness of breath. Admitted for COPD exacerbation.  Hospital Course:  Rick Cruz was admitted for treatment of COPD exacerbation. He was treated empirically with Levaquin and  routine treatments for COPD. Early in his hospitalization developed hematemesis, and aspirin/Plavix were discontinued. Patient seen by gastroenterology and underwent EGD which revealed severe reflux esophagitis. Hemoglobin remained stable and there were no further episodes. He continues on PPI and carafate. His respiratory status is at baseline. Other issues as below. Plan discharge home today with home health.  1. COPD exacerbation: Appears to be at baseline. Completes antibiotics today. Continue chronic oxygen therapy. Wean steroids. 2. Hematemesis: Secondary to severe erosive reflux esophagitis. Continue twice a day PPI. Short course of Carafate for 5-7 days total. Started 8/4. 3. Diabetes mellitus type 2: Stable. Continue Lantus, meal coverage, sliding scale insulin. 4. Hyperkalemia: Resolved. Status post Kayexalate 8/5 in the morning, no recurrence. This has also been seen in the past. Hold potassium supplementation and ACE inhibitor. Followup as an outpatient. 5. Chronic hypoxic, hypercapnic respiratory failure: Appears stable.  6. Hyponatremia: Etiology unclear. Likely secondary lung disease. Stable. Asymptomatic. 7. Depression, schizophrenia: Continue Geodon 8. Cigarette smoker: Recommend cessation.  9. Obstructive sleep apnea:Continue CPAP. Consultants:  Gastroenterology Procedures:  EGD: Severe erosive reflux esophagitis.  2-D echocardiogram:Left ventricular ejection fraction 65-70%. Grade 1 diastolic dysfunction.   After d/c placed" new med" for acid reflux and no cough since very confused with names of meds and rx in general. ACEi was stopped at admit due to high K, not cough per se.  Back to baseline doe x across the room   No obvious daytime variabilty or assoc chronic cough or cp or chest tightness, subjective wheeze overt sinus  or hb symptoms. No unusual exp hx or h/o childhood pna/ asthma or knowledge of premature birth.   Sleeping in recliner  ok without nocturnal  or early am  exacerbation  of respiratory  c/o's or need for noct saba. Also denies any obvious fluctuation of symptoms with weather or environmental changes or other aggravating or alleviating factors except as outlined above   Review of Systems  Constitutional: Negative for fever, chills, diaphoresis, activity change, appetite change, fatigue and unexpected weight change.  HENT: Positive for congestion. Negative for hearing loss, ear pain, nosebleeds, sore throat, facial swelling, rhinorrhea, sneezing, mouth sores, trouble swallowing, neck pain, neck stiffness, dental problem, voice change, postnasal drip, sinus pressure, tinnitus and ear discharge.   Eyes: Negative for photophobia, discharge, itching and visual disturbance.  Respiratory: Positive for shortness of breath. Negative for apnea, cough, choking, chest tightness, wheezing and stridor.   Cardiovascular: Negative for chest pain, palpitations and leg swelling.  Gastrointestinal: Positive for abdominal pain. Negative for nausea, vomiting, constipation, blood in stool and abdominal distention.  Genitourinary: Negative for dysuria, urgency, frequency, hematuria, flank pain, decreased urine volume and difficulty urinating.  Musculoskeletal: Negative for myalgias, back pain, joint swelling, arthralgias and gait problem.  Skin: Negative for color change, pallor and rash.  Neurological: Positive for headaches. Negative for dizziness, tremors, seizures, syncope, speech difficulty, weakness, light-headedness and numbness.  Hematological: Negative for adenopathy. Does not bruise/bleed easily.  Psychiatric/Behavioral: Positive for dysphoric mood. Negative for confusion, sleep disturbance and agitation. The patient is nervous/anxious.        Objective:   Physical Exam  W/c bound markedly obese wm nad  Wt Readings from Last 3 Encounters:  05/06/13 290 lb (131.543 kg)  04/22/13 283 lb 8.2 oz (128.6 kg)  04/22/13 283 lb 8.2 oz (128.6 kg)    HEENT mild  turbinate edema.  Oropharynx no thrush or excess pnd or cobblestoning.  No JVD or cervical adenopathy. Mild accessory muscle hypertrophy. Trachea midline, nl thryroid. Chest was hyperinflated by percussion with diminished breath sounds and moderate increased exp time without wheeze. Hoover sign positive at mid inspiration. Regular rate and rhythm without murmur gallop or rub or increase P2 or edema.  Abd: pot belly contour, no hsm, nl excursion. Ext warm without cyanosis or clubbing.     cxr 04/18/13 No evidence of acute cardiopulmonary disease.  Chronic interstitial markings.     Assessment & Plan:

## 2013-05-07 NOTE — Assessment & Plan Note (Signed)
02 3lpm chronically with HCO3 35 04/23/13 so likely hypercarbic as well related to OHS > COPD  Adequate control on present rx, reviewed > no change in rx needed  But needs tsh on return

## 2013-05-07 NOTE — Assessment & Plan Note (Signed)
His previous pft's show minimal airflow obstruction but he prob does have some copd and may do just as well on tudorza one bid with the goal of minimizing his saba dependency so it can truly be used as a rescue medication.  He needs to maintain off acei  And on max gerd Rx in order to be able to interpret his non-specific symptoms of "copd" with better clarity and return for pfts  See instructions for specific recommendations which were reviewed directly with the patient who was given a copy with highlighter outlining the key components.

## 2013-06-17 ENCOUNTER — Encounter: Payer: Self-pay | Admitting: Internal Medicine

## 2013-06-17 ENCOUNTER — Other Ambulatory Visit (INDEPENDENT_AMBULATORY_CARE_PROVIDER_SITE_OTHER): Payer: Medicare Other

## 2013-06-17 ENCOUNTER — Ambulatory Visit (INDEPENDENT_AMBULATORY_CARE_PROVIDER_SITE_OTHER): Payer: Medicare Other | Admitting: Internal Medicine

## 2013-06-17 ENCOUNTER — Other Ambulatory Visit (HOSPITAL_COMMUNITY): Payer: Self-pay | Admitting: Internal Medicine

## 2013-06-17 VITALS — BP 130/82 | HR 112 | Temp 98.1°F | Ht 72.0 in | Wt 289.0 lb

## 2013-06-17 DIAGNOSIS — J449 Chronic obstructive pulmonary disease, unspecified: Secondary | ICD-10-CM

## 2013-06-17 DIAGNOSIS — J9610 Chronic respiratory failure, unspecified whether with hypoxia or hypercapnia: Secondary | ICD-10-CM

## 2013-06-17 DIAGNOSIS — M549 Dorsalgia, unspecified: Secondary | ICD-10-CM

## 2013-06-17 LAB — PULMONARY FUNCTION TEST

## 2013-06-17 NOTE — Assessment & Plan Note (Addendum)
02 3lpm chronically with HCO3 35 04/23/13 so likely hypercarbic as well related to OHS > COPD - TSH 06/17/2013  =  2.1 with HCO3 31 so improved but still likely hypercarbic   No change rx, work on wt loss  Pulmonary f/u is prn

## 2013-06-17 NOTE — Progress Notes (Signed)
Subjective:    Patient ID: Rick Cruz, male    DOB: Jun 29, 1946   MRN: [id number]  HPI  67 yowm quit smoking July 2014 dx with copd by PFT's 10/25/05 FEV1 0.70 but ratio was 68 referred 05/06/13 to pulmonary clinic by Dr Rick Cruz   05/06/2013 1st eval in EPIC era/ Rick Cruz cc progressive x 10 years with doe to point where best days across room on 02 on 3lpm sleeps in recliner x years and required admit:   Admit date: 04/18/2013  Discharge date: 04/23/2013  Recommendations for Outpatient Follow-up:  1. Follow-up resolution of COPD exacerbation. He has been prescribed a rescue inhaler. Consider Combivent or Spiriva as clinically indicated. Wife reports he is on Flovent at home. She does note that he is grossly noncompliant with his medications and continues to smoke. 2. Follow-up erosive reflux esophagitis, currently on PPI 3. Continue to encourage smoking cessation 4. Resume ASA, Plavix 8/12 5. Hold potassium, quinapril until follow-up with PCP 6. HH RN, Rick Cruz aide Follow-up Information    Follow up with Rick Gravel, MD In 1 week.    Contact information:    [address] [place]  [place] [address]  [phone]     Discharge Diagnoses:  1. COPD exacerbation 2. Hematemesis secondary to severe erosive reflux esophagitis 3. DM type 2 4. Hyperkalemia, resolved 5. Chronic hypoxic, hypercapneic respiratory failure 6. Cigarette smoker 7. OSA Discharge Condition: improved  Disposition: improved  Diet recommendation: heart healthy diet  Filed Weights    04/18/13 1941  04/19/13 0105  04/22/13 0449   Weight:  131.543 kg (290 lb)  136.079 kg (300 lb)  128.6 kg (283 lb 8.2 oz)   History of present illness:  67 year old man year old man with history of oxygen-dependent COPD presented to emergency department with increasing shortness of breath. Admitted for COPD exacerbation.  Hospital Course:  Rick Cruz was admitted for treatment of COPD exacerbation. He was treated empirically with Levaquin and  routine treatments for COPD. Early in his hospitalization developed hematemesis, and aspirin/Plavix were discontinued. Patient seen by gastroenterology and underwent EGD which revealed severe reflux esophagitis. Hemoglobin remained stable and there were no further episodes. He continues on PPI and carafate. His respiratory status is at baseline. Other issues as below. Plan discharge home today with home health.  1. COPD exacerbation: Appears to be at baseline. Completes antibiotics today. Continue chronic oxygen therapy. Wean steroids. 2. Hematemesis: Secondary to severe erosive reflux esophagitis. Continue twice a day PPI. Short course of Carafate for 5-7 days total. Started 8/4. 3. Diabetes mellitus type 2: Stable. Continue Lantus, meal coverage, sliding scale insulin. 4. Hyperkalemia: Resolved. Status post Kayexalate 8/5 in the morning, no recurrence. This has also been seen in the past. Hold potassium supplementation and ACE inhibitor. Followup as an outpatient. 5. Chronic hypoxic, hypercapnic respiratory failure: Appears stable.  6. Hyponatremia: Etiology unclear. Likely secondary lung disease. Stable. Asymptomatic. 7. Depression, schizophrenia: Continue Geodon 8. Cigarette smoker: Recommend cessation.  9. Obstructive sleep apnea:Continue CPAP. Consultants:  Gastroenterology Procedures:  EGD: Severe erosive reflux esophagitis.  2-D echocardiogram:Left ventricular ejection fraction 65-70%. Grade 1 diastolic dysfunction.   After d/c placed" new med" for acid reflux and no cough since very confused with names of meds and rx in general. ACEi was stopped at admit due to high K, not cough per se.  Back to baseline doe x across the room   rec tudorza one twice daily Change protonix 40 mg Take 30- 60 min before your first and  last meals of the day  Stop fish oil abd eat more salmon Change albuterol Only use your albuterol(always try ventolin first then the neb as a back up) as a rescue  medication  GERD   06/17/2013 f/u ov/Rick Cruz re: 02 dep resp failure s airflow obst Chief Complaint  Patient presents with  . Followup with PFT    Pt reports cough and SOB have improved some since last visit. No new co's today. He is taking albuterol for rescue about once per wk.    No better at all  on tudorza    No obvious day to day or daytime variabilty or assoc chronic cough or cp or chest tightness, subjective wheeze overt sinus or hb symptoms. No unusual exp hx or h/o childhood pna/ asthma or knowledge of premature birth.  Sleeping ok without nocturnal  or early am exacerbation  of respiratory  c/o's or need for noct saba. Also denies any obvious fluctuation of symptoms with weather or environmental changes or other aggravating or alleviating factors except as outlined above   Current Medications, Allergies, Complete Past Medical History, Past Surgical History, Family History, and Social History were reviewed in [employer] record.  ROS  The following are not active complaints unless bolded sore throat, dysphagia, dental problems, itching, sneezing,  nasal congestion or excess/ purulent secretions, ear ache,   fever, chills, sweats, unintended wt loss, pleuritic or exertional cp, hemoptysis,  orthopnea pnd or leg swelling, presyncope, palpitations, heartburn, abdominal pain, anorexia, nausea, vomiting, diarrhea  or change in bowel or urinary habits, change in stools or urine, dysuria,hematuria,  rash, arthralgias, visual complaints, headache, numbness weakness or ataxia or problems with walking or coordination,  change in mood/affect or memory.               Objective:   Physical Exam  W/c bound markedly obese wm nad   06/17/2013    289  Wt Readings from Last 3 Encounters:  05/06/13 290 lb (131.543 kg)  04/22/13 283 lb 8.2 oz (128.6 kg)  04/22/13 283 lb 8.2 oz (128.6 kg)    HEENT mild turbinate edema.  Oropharynx no thrush or excess pnd or cobblestoning.   No JVD or cervical adenopathy. Mild accessory muscle hypertrophy. Trachea midline, nl thryroid. Chest was hyperinflated by percussion with diminished breath sounds and moderate increased exp time without wheeze. Hoover sign positive at mid inspiration. Regular rate and rhythm without murmur gallop or rub or increase P2 or edema.  Abd: pot belly contour, no hsm, nl excursion. Ext warm without cyanosis or clubbing.     cxr 04/18/13 No evidence of acute cardiopulmonary disease.  Chronic interstitial markings.     Assessment & Plan:

## 2013-06-17 NOTE — Patient Instructions (Addendum)
Stop tudorza  Please remember to go to the lab department downstairs for your tests - we will call you with the results when they are available.     If you are satisfied with your treatment plan let your doctor know and he/she can either refill your medications or you can return here when your prescription runs out.     If in any way you are not 100% satisfied,  please tell us.  If 100% better, tell your friends!

## 2013-06-17 NOTE — Progress Notes (Signed)
PFT done today. 

## 2013-06-17 NOTE — Assessment & Plan Note (Addendum)
-   off acei effective 05/07/13 > improved 06/17/2013  - 06/17/2013  FEV1  1.91 (52%)  Ratio 76 no change p B2,  DLCO 54% corrects to 100%   C/w restrictive changes from obesity with the ACEi effects immitating copd, which he does not have.  rec wt loss, saba prn only - no need for [place]

## 2013-06-18 ENCOUNTER — Encounter (HOSPITAL_COMMUNITY): Payer: Self-pay

## 2013-06-18 ENCOUNTER — Ambulatory Visit (HOSPITAL_COMMUNITY)
Admission: RE | Admit: 2013-06-18 | Discharge: 2013-06-18 | Disposition: A | Discharge status: Home / Self Care | Payer: Medicare Other | Source: Ambulatory Visit | Attending: Internal Medicine | Admitting: Internal Medicine

## 2013-06-18 DIAGNOSIS — M545 Low back pain, unspecified: Secondary | ICD-10-CM | POA: Insufficient documentation

## 2013-06-18 DIAGNOSIS — M5124 Other intervertebral disc displacement, thoracic region: Secondary | ICD-10-CM | POA: Insufficient documentation

## 2013-06-18 DIAGNOSIS — M5126 Other intervertebral disc displacement, lumbar region: Secondary | ICD-10-CM | POA: Insufficient documentation

## 2013-06-18 DIAGNOSIS — M48061 Spinal stenosis, lumbar region without neurogenic claudication: Secondary | ICD-10-CM | POA: Insufficient documentation

## 2013-06-18 DIAGNOSIS — M549 Dorsalgia, unspecified: Secondary | ICD-10-CM

## 2013-06-18 DIAGNOSIS — M79609 Pain in unspecified limb: Secondary | ICD-10-CM | POA: Insufficient documentation

## 2013-06-18 LAB — BASIC METABOLIC PANEL
BUN: 10 mg/dL (ref 6–23)
Chloride: 95 mEq/L — ABNORMAL LOW (ref 96–112)
Creatinine, Ser: 0.9 mg/dL (ref 0.4–1.5)
GFR: 94.39 mL/min (ref >60.00)

## 2013-06-18 LAB — TSH: TSH: 2.1 u[IU]/mL (ref 0.35–5.50)

## 2013-07-02 ENCOUNTER — Encounter: Payer: Self-pay | Admitting: Internal Medicine

## 2013-08-04 ENCOUNTER — Ambulatory Visit: Payer: Self-pay

## 2013-10-28 ENCOUNTER — Ambulatory Visit: Payer: Self-pay | Admitting: Podiatry

## 2014-01-02 ENCOUNTER — Inpatient Hospital Stay (HOSPITAL_COMMUNITY)
Admission: EM | Admit: 2014-01-02 | Discharge: 2014-01-10 | DRG: 917 | Disposition: A | Discharge status: Home Health | Payer: Medicare Other | Attending: Internal Medicine | Admitting: Internal Medicine

## 2014-01-02 ENCOUNTER — Inpatient Hospital Stay (HOSPITAL_COMMUNITY): Payer: Medicare Other

## 2014-01-02 ENCOUNTER — Emergency Department (HOSPITAL_COMMUNITY): Payer: Medicare Other

## 2014-01-02 ENCOUNTER — Encounter (HOSPITAL_COMMUNITY): Payer: Self-pay | Admitting: Emergency Medicine

## 2014-01-02 DIAGNOSIS — G9340 Encephalopathy, unspecified: Secondary | ICD-10-CM | POA: Diagnosis present

## 2014-01-02 DIAGNOSIS — T391X1A Poisoning by 4-Aminophenol derivatives, accidental (unintentional), initial encounter: Principal | ICD-10-CM | POA: Diagnosis present

## 2014-01-02 DIAGNOSIS — Z91199 Patient's noncompliance with other medical treatment and regimen due to unspecified reason: Secondary | ICD-10-CM

## 2014-01-02 DIAGNOSIS — G4733 Obstructive sleep apnea (adult) (pediatric): Secondary | ICD-10-CM | POA: Diagnosis present

## 2014-01-02 DIAGNOSIS — J449 Chronic obstructive pulmonary disease, unspecified: Secondary | ICD-10-CM | POA: Diagnosis present

## 2014-01-02 DIAGNOSIS — R410 Disorientation, unspecified: Secondary | ICD-10-CM

## 2014-01-02 DIAGNOSIS — G894 Chronic pain syndrome: Secondary | ICD-10-CM | POA: Diagnosis present

## 2014-01-02 DIAGNOSIS — F200 Paranoid schizophrenia: Secondary | ICD-10-CM | POA: Diagnosis present

## 2014-01-02 DIAGNOSIS — J9610 Chronic respiratory failure, unspecified whether with hypoxia or hypercapnia: Secondary | ICD-10-CM

## 2014-01-02 DIAGNOSIS — Z87891 Personal history of nicotine dependence: Secondary | ICD-10-CM

## 2014-01-02 DIAGNOSIS — G92 Toxic encephalopathy: Secondary | ICD-10-CM | POA: Diagnosis present

## 2014-01-02 DIAGNOSIS — E7800 Pure hypercholesterolemia, unspecified: Secondary | ICD-10-CM | POA: Diagnosis present

## 2014-01-02 DIAGNOSIS — I509 Heart failure, unspecified: Secondary | ICD-10-CM | POA: Diagnosis present

## 2014-01-02 DIAGNOSIS — Z79899 Other long term (current) drug therapy: Secondary | ICD-10-CM

## 2014-01-02 DIAGNOSIS — J4489 Other specified chronic obstructive pulmonary disease: Secondary | ICD-10-CM | POA: Diagnosis present

## 2014-01-02 DIAGNOSIS — E119 Type 2 diabetes mellitus without complications: Secondary | ICD-10-CM

## 2014-01-02 DIAGNOSIS — G8929 Other chronic pain: Secondary | ICD-10-CM

## 2014-01-02 DIAGNOSIS — R739 Hyperglycemia, unspecified: Secondary | ICD-10-CM

## 2014-01-02 DIAGNOSIS — I10 Essential (primary) hypertension: Secondary | ICD-10-CM | POA: Diagnosis present

## 2014-01-02 DIAGNOSIS — E871 Hypo-osmolality and hyponatremia: Secondary | ICD-10-CM

## 2014-01-02 DIAGNOSIS — F1910 Other psychoactive substance abuse, uncomplicated: Secondary | ICD-10-CM | POA: Diagnosis present

## 2014-01-02 DIAGNOSIS — R21 Rash and other nonspecific skin eruption: Secondary | ICD-10-CM

## 2014-01-02 DIAGNOSIS — D72829 Elevated white blood cell count, unspecified: Secondary | ICD-10-CM | POA: Diagnosis present

## 2014-01-02 DIAGNOSIS — E236 Other disorders of pituitary gland: Secondary | ICD-10-CM | POA: Diagnosis present

## 2014-01-02 DIAGNOSIS — Z9981 Dependence on supplemental oxygen: Secondary | ICD-10-CM

## 2014-01-02 DIAGNOSIS — Z9119 Patient's noncompliance with other medical treatment and regimen: Secondary | ICD-10-CM

## 2014-01-02 DIAGNOSIS — F1110 Opioid abuse, uncomplicated: Secondary | ICD-10-CM

## 2014-01-02 DIAGNOSIS — G929 Unspecified toxic encephalopathy: Secondary | ICD-10-CM | POA: Diagnosis present

## 2014-01-02 DIAGNOSIS — F1021 Alcohol dependence, in remission: Secondary | ICD-10-CM | POA: Diagnosis present

## 2014-01-02 DIAGNOSIS — I2510 Atherosclerotic heart disease of native coronary artery without angina pectoris: Secondary | ICD-10-CM | POA: Diagnosis present

## 2014-01-02 DIAGNOSIS — A0472 Enterocolitis due to Clostridium difficile, not specified as recurrent: Secondary | ICD-10-CM | POA: Diagnosis present

## 2014-01-02 DIAGNOSIS — R0902 Hypoxemia: Secondary | ICD-10-CM | POA: Diagnosis present

## 2014-01-02 DIAGNOSIS — F1310 Sedative, hypnotic or anxiolytic abuse, uncomplicated: Secondary | ICD-10-CM

## 2014-01-02 DIAGNOSIS — F19921 Other psychoactive substance use, unspecified with intoxication with delirium: Secondary | ICD-10-CM | POA: Diagnosis present

## 2014-01-02 HISTORY — DX: Other chronic pain: G89.29

## 2014-01-02 LAB — BASIC METABOLIC PANEL
BUN: 5 mg/dL — ABNORMAL LOW (ref 6–23)
BUN: 5 mg/dL — AB (ref 6–23)
BUN: 5 mg/dL — ABNORMAL LOW (ref 6–23)
CALCIUM: 8.4 mg/dL (ref 8.4–10.5)
CHLORIDE: 85 meq/L — AB (ref 96–112)
CO2: 26 mEq/L (ref 19–32)
CO2: 28 meq/L (ref 19–32)
CO2: 29 mEq/L (ref 19–32)
CREATININE: 0.66 mg/dL (ref 0.50–1.35)
CREATININE: 0.68 mg/dL (ref 0.50–1.35)
CREATININE: 0.69 mg/dL (ref 0.50–1.35)
Calcium: 8.4 mg/dL (ref 8.4–10.5)
Calcium: 8.5 mg/dL (ref 8.4–10.5)
Chloride: 84 mEq/L — ABNORMAL LOW (ref 96–112)
Chloride: 85 mEq/L — ABNORMAL LOW (ref 96–112)
GFR calc Af Amer: >90 mL/min (ref >90)
GFR calc non Af Amer: >90 mL/min (ref >90)
GFR calc non Af Amer: >90 mL/min (ref >90)
GFR calc non Af Amer: >90 mL/min (ref >90)
GLUCOSE: 130 mg/dL — AB (ref 70–99)
Glucose, Bld: 176 mg/dL — ABNORMAL HIGH (ref 70–99)
Glucose, Bld: 163 mg/dL — ABNORMAL HIGH (ref 70–99)
Potassium: 4.7 mEq/L (ref 3.7–5.3)
Potassium: 4.7 mEq/L (ref 3.7–5.3)
Potassium: 4.5 mEq/L (ref 3.7–5.3)
Sodium: 122 mEq/L — ABNORMAL LOW (ref 137–147)
Sodium: 123 mEq/L — ABNORMAL LOW (ref 137–147)
Sodium: 124 mEq/L — ABNORMAL LOW (ref 137–147)

## 2014-01-02 LAB — CBC WITH DIFFERENTIAL/PLATELET
Basophils Absolute: 0 10*3/uL (ref 0.0–0.1)
Basophils Relative: 0 % (ref 0–1)
EOS ABS: 0.1 10*3/uL (ref 0.0–0.7)
EOS PCT: 0 % (ref 0–5)
HCT: 38.9 % — ABNORMAL LOW (ref 39.0–52.0)
Hemoglobin: 12.9 g/dL — ABNORMAL LOW (ref 13.0–17.0)
LYMPHS ABS: 1.2 10*3/uL (ref 0.7–4.0)
Lymphocytes Relative: 9 % — ABNORMAL LOW (ref 12–46)
MCH: 28.7 pg (ref 26.0–34.0)
MCHC: 33.2 g/dL (ref 30.0–36.0)
MCV: 86.4 fL (ref 78.0–100.0)
MONO ABS: 0.9 10*3/uL (ref 0.1–1.0)
MONOS PCT: 6 % (ref 3–12)
Neutro Abs: 11.7 10*3/uL — ABNORMAL HIGH (ref 1.7–7.7)
Neutrophils Relative %: 85 % — ABNORMAL HIGH (ref 43–77)
PLATELETS: 223 10*3/uL (ref 150–400)
RBC: 4.5 MIL/uL (ref 4.22–5.81)
RDW: 12.5 % (ref 11.5–15.5)
WBC: 13.9 10*3/uL — ABNORMAL HIGH (ref 4.0–10.5)

## 2014-01-02 LAB — PROTIME-INR
INR: 0.97 (ref 0.00–1.49)
Prothrombin Time: 12.7 seconds (ref 11.6–15.2)

## 2014-01-02 LAB — URINALYSIS, ROUTINE W REFLEX MICROSCOPIC
Glucose, UA: 500 mg/dL — AB
Hgb urine dipstick: NEGATIVE
Ketones, ur: 15 mg/dL — AB
Leukocytes, UA: NEGATIVE
NITRITE: NEGATIVE
PROTEIN: 30 mg/dL — AB
Specific Gravity, Urine: 1.02 (ref 1.005–1.030)
UROBILINOGEN UA: 0.2 mg/dL (ref 0.0–1.0)
pH: 6 (ref 5.0–8.0)

## 2014-01-02 LAB — COMPREHENSIVE METABOLIC PANEL
ALT: 10 U/L (ref 0–53)
AST: 15 U/L (ref 0–37)
Albumin: 3.7 g/dL (ref 3.5–5.2)
Alkaline Phosphatase: 73 U/L (ref 39–117)
BUN: 6 mg/dL (ref 6–23)
CALCIUM: 9 mg/dL (ref 8.4–10.5)
CO2: 26 mEq/L (ref 19–32)
Chloride: 77 mEq/L — ABNORMAL LOW (ref 96–112)
Creatinine, Ser: 0.68 mg/dL (ref 0.50–1.35)
GFR calc non Af Amer: >90 mL/min (ref >90)
GLUCOSE: 228 mg/dL — AB (ref 70–99)
Potassium: 4.6 mEq/L (ref 3.7–5.3)
SODIUM: 119 meq/L — AB (ref 137–147)
Total Bilirubin: 0.7 mg/dL (ref 0.3–1.2)
Total Protein: 6.8 g/dL (ref 6.0–8.3)

## 2014-01-02 LAB — URINE MICROSCOPIC-ADD ON

## 2014-01-02 LAB — RAPID URINE DRUG SCREEN, HOSP PERFORMED
AMPHETAMINES: NOT DETECTED
BARBITURATES: NOT DETECTED
Benzodiazepines: NOT DETECTED
Cocaine: NOT DETECTED
Opiates: POSITIVE — AB
Tetrahydrocannabinol: NOT DETECTED

## 2014-01-02 LAB — TROPONIN I: Troponin I: <0.3 ng/mL (ref <0.30)

## 2014-01-02 LAB — ETHANOL: Alcohol, Ethyl (B): <11 mg/dL (ref 0–11)

## 2014-01-02 LAB — SALICYLATE LEVEL

## 2014-01-02 LAB — GLUCOSE, CAPILLARY
Glucose-Capillary: 167 mg/dL — ABNORMAL HIGH (ref 70–99)
Glucose-Capillary: 156 mg/dL — ABNORMAL HIGH (ref 70–99)
Glucose-Capillary: 126 mg/dL — ABNORMAL HIGH (ref 70–99)

## 2014-01-02 LAB — PRO B NATRIURETIC PEPTIDE: PRO B NATRI PEPTIDE: 229 pg/mL — AB (ref 0–125)

## 2014-01-02 LAB — CK: Total CK: 395 U/L — ABNORMAL HIGH (ref 7–232)

## 2014-01-02 LAB — ACETAMINOPHEN LEVEL

## 2014-01-02 LAB — I-STAT CG4 LACTIC ACID, ED: LACTIC ACID, VENOUS: 1.76 mmol/L (ref 0.5–2.2)

## 2014-01-02 LAB — AMMONIA: Ammonia: 22 umol/L (ref 11–60)

## 2014-01-02 MED ORDER — SODIUM CHLORIDE 0.9 % IV SOLN
1000.0000 mL | Freq: Once | INTRAVENOUS | Status: AC
Start: 2014-01-02 — End: 2014-01-02
  Administered 2014-01-02: 1000 mL via INTRAVENOUS

## 2014-01-02 MED ORDER — INSULIN GLARGINE 100 UNIT/ML ~~LOC~~ SOLN
30.0000 [IU] | Freq: Every evening | SUBCUTANEOUS | Status: DC
  Administered 2014-01-02 – 2014-01-09 (×8): 30 [IU] via SUBCUTANEOUS
  Filled 2014-01-02 (×9): qty 0.3

## 2014-01-02 MED ORDER — ALBUTEROL SULFATE (2.5 MG/3ML) 0.083% IN NEBU
3.0000 mL | INHALATION_SOLUTION | Freq: Four times a day (QID) | RESPIRATORY_TRACT | Status: DC | PRN
  Administered 2014-01-04 – 2014-01-08 (×3): 3 mL via RESPIRATORY_TRACT
  Filled 2014-01-02 (×3): qty 3

## 2014-01-02 MED ORDER — METOPROLOL TARTRATE 50 MG PO TABS
50.0000 mg | ORAL_TABLET | Freq: Two times a day (BID) | ORAL | Status: DC
Start: 2014-01-02 — End: 2014-01-10
  Administered 2014-01-02 – 2014-01-10 (×16): 50 mg via ORAL
  Filled 2014-01-02 (×8): qty 1
  Filled 2014-01-02: qty 2
  Filled 2014-01-02 (×7): qty 1

## 2014-01-02 MED ORDER — SODIUM CHLORIDE 0.9 % IV SOLN
INTRAVENOUS | Status: DC
  Administered 2014-01-02 – 2014-01-03 (×4): via INTRAVENOUS

## 2014-01-02 MED ORDER — INSULIN ASPART 100 UNIT/ML ~~LOC~~ SOLN
0.0000 [IU] | Freq: Three times a day (TID) | SUBCUTANEOUS | Status: DC
  Administered 2014-01-02: 2 [IU] via SUBCUTANEOUS
  Administered 2014-01-03 (×2): 1 [IU] via SUBCUTANEOUS
  Administered 2014-01-04 – 2014-01-05 (×3): 2 [IU] via SUBCUTANEOUS
  Administered 2014-01-05 (×2): 1 [IU] via SUBCUTANEOUS
  Administered 2014-01-06: 2 [IU] via SUBCUTANEOUS
  Administered 2014-01-06: 3 [IU] via SUBCUTANEOUS
  Administered 2014-01-06: 1 [IU] via SUBCUTANEOUS
  Administered 2014-01-07 (×2): 2 [IU] via SUBCUTANEOUS
  Administered 2014-01-07: 1 [IU] via SUBCUTANEOUS
  Administered 2014-01-08: 2 [IU] via SUBCUTANEOUS
  Administered 2014-01-08: 3 [IU] via SUBCUTANEOUS
  Administered 2014-01-08: 2 [IU] via SUBCUTANEOUS
  Administered 2014-01-09 (×2): 3 [IU] via SUBCUTANEOUS
  Administered 2014-01-09: 2 [IU] via SUBCUTANEOUS
  Administered 2014-01-10: 7 [IU] via SUBCUTANEOUS
  Administered 2014-01-10: 3 [IU] via SUBCUTANEOUS

## 2014-01-02 MED ORDER — LORAZEPAM 2 MG/ML IJ SOLN
0.5000 mg | INTRAMUSCULAR | Status: DC | PRN
  Administered 2014-01-02 – 2014-01-08 (×19): 0.5 mg via INTRAVENOUS
  Filled 2014-01-02 (×20): qty 1

## 2014-01-02 MED ORDER — SODIUM CHLORIDE 0.9 % IJ SOLN
3.0000 mL | Freq: Two times a day (BID) | INTRAMUSCULAR | Status: DC
  Administered 2014-01-02 – 2014-01-10 (×14): 3 mL via INTRAVENOUS

## 2014-01-02 MED ORDER — DEXTROSE 5 % IV SOLN
500.0000 mg | Freq: Once | INTRAVENOUS | Status: AC
  Administered 2014-01-02: 500 mg via INTRAVENOUS

## 2014-01-02 MED ORDER — ALBUTEROL SULFATE HFA 108 (90 BASE) MCG/ACT IN AERS
2.0000 | INHALATION_SPRAY | Freq: Four times a day (QID) | RESPIRATORY_TRACT | Status: DC | PRN
  Filled 2014-01-02: qty 6.7

## 2014-01-02 MED ORDER — DEXTROSE 5 % IV SOLN
1.0000 g | Freq: Once | INTRAVENOUS | Status: AC
  Administered 2014-01-02: 1 g via INTRAVENOUS
  Filled 2014-01-02: qty 10

## 2014-01-02 MED ORDER — SODIUM CHLORIDE 0.9 % IV SOLN
1000.0000 mL | INTRAVENOUS | Status: DC
  Administered 2014-01-02: 1000 mL via INTRAVENOUS

## 2014-01-02 MED ORDER — PANTOPRAZOLE SODIUM 40 MG PO TBEC
40.0000 mg | DELAYED_RELEASE_TABLET | Freq: Every day | ORAL | Status: DC
  Administered 2014-01-03 – 2014-01-10 (×8): 40 mg via ORAL
  Filled 2014-01-02 (×8): qty 1

## 2014-01-02 NOTE — ED Notes (Signed)
Pt has used fentanyl patch 50 mcg that ems brought in and placed on countertop.  Wasted fentanyl patch witness Lanier Clam.

## 2014-01-02 NOTE — ED Notes (Signed)
Patient brought in via EMS from home. Alert but some confusion. Airway patent. Per EMS personal patient c/o shortness of breath and family reports increasing confusion for past 2-3 days. EMS reports CBG of 207, sinus rhythm on monitor. Patient reports being prescribed Vicodin 5/325mg  po in which he is to take 6 a day but states he takes 12 a day to help him breath easier. 10mcg fentanyl patch removed by EMS. Patient reports anxiety.

## 2014-01-02 NOTE — ED Notes (Signed)
Inserted foley catheter using sterile technique.  Lanier Clam and Rosealee Albee at bedside to assist.  Pt tolerated well.

## 2014-01-02 NOTE — ED Provider Notes (Signed)
CSN: [id number]     Arrival date & time 01/02/14  0901 History  This chart was scribed for Richarda Blade, MD by Jenne Campus, ED Scribe. This patient was seen in room APA14/AP[facility]14 and the patient's care was started at 9:05 AM.    Chief Complaint  Patient presents with  . Shortness of Breath  . Altered Mental Status     The history is provided by the EMS personnel and a parent. No language interpreter was used.    HPI Comments: Rick Cruz is a 68 y.o. male with a h/o COPD and CHF on 2L O2 continuously brought in by ambulance from home, who presents to the Emergency Department for worsening of chronic SOB with associated increased confusion for the past 2 to 3 days. Pt admitted to EMS that he has been abusing his 5/325 mg Vicodin PO x6 times daily instead taking 12 a day. He claims that the Vicodin helps improve his breathing.  90% on RA, increased to 94% on O2. EMS also reports removing a 50 mcg fentanyl patch en route. Pt states that he has felt his SOB increasing and he has felt anxious within the past few days. He denies any other symptoms.  PCP is Dr. [name]. Seen by Dr. Glennon Hamilton, Endocrinologist last week  Past Medical History  Diagnosis Date  . COPD (chronic obstructive pulmonary disease)   . Diabetes mellitus without complication   . CHF (congestive heart failure)     diastolic   . Hypercholesterolemia   . Depression   . Schizophrenia   . Sleep apnea     noncompliant with BiPAP  . Tobacco abuse   . On home O2     3 liters  . Chronic pain    Past Surgical History  Procedure Laterality Date  . Knee surgery      X2  . 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   . Colonoscopy  July 2011    Dr. Benson Norway: multiple polyps, internal and external hemorrhoids, diverticulosis. tubular adenoma  . Esophagogastroduodenoscopy (egd) with propofol N/A 04/20/2013    Procedure: ESOPHAGOGASTRODUODENOSCOPY (EGD)  WITH PROPOFOL;  Surgeon: Daneil Dolin, MD;  Location: [facility];  Service: Endoscopy;  Laterality: N/A;   Family History  Problem Relation Age of Onset  . Colon cancer Neg Hx   . Parkinson's disease     History  Substance Use Topics  . Smoking status: Former Smoker -- 1.00 packs/day for 50 years    Types: Cigarettes    Quit date: 09/16/2013  . Smokeless tobacco: Never Used  . Alcohol Use: No     Comment: history of ETOH abuse in remote past, none in at least 10 years    Review of Systems  Respiratory: Positive for shortness of breath.   Psychiatric/Behavioral: The patient is nervous/anxious.   All other systems reviewed and are negative.   Allergies  Phenergan  Home Medications   Prior to Admission medications   Medication Sig Start Date End Date Taking? Authorizing Provider  albuterol (PROVENTIL HFA;VENTOLIN HFA) 108 (90 BASE) MCG/ACT inhaler Inhale 2 puffs into the lungs every 6 (six) hours as needed for wheezing or shortness of breath. 04/23/13   Samuella Cota, MD  Cinnamon 500 MG capsule Take 500 mg by mouth daily.    Historical Provider, MD  furosemide (LASIX) 40 MG tablet Take 40 mg by mouth every other day. Patient takes  Mon.,Wed.,Fri.,    Historical Provider, MD  HYDROcodone-acetaminophen (NORCO) 7.5-325 MG per tablet Take 1 tablet by mouth 4 (four) times daily as needed for pain.    Historical Provider, MD  Insulin Glargine (LANTUS SOLOSTAR) 100 UNIT/ML SOPN Inject 50 Units into the skin every evening.     Historical Provider, MD  insulin lispro (HUMALOG) 100 UNIT/ML injection Inject 14 Units into the skin 3 (three) times daily.    Historical Provider, MD  Liraglutide (VICTOZA) 18 MG/3ML SOPN Inject 1.2 Units into the skin daily.    Historical Provider, MD  metFORMIN (GLUCOPHAGE) 1000 MG tablet Take 1,000 mg by mouth 2 (two) times daily with a meal.     Historical Provider, MD  metoprolol (LOPRESSOR) 50 MG tablet Take 50 mg by mouth 2 (two) times daily.    Historical  Provider, MD  pantoprazole (PROTONIX) 40 MG tablet Take 30- 60 min before your first and last meals of the day 05/06/13   Tanda Rockers, MD  simvastatin (ZOCOR) 40 MG tablet Take 40 mg by mouth every evening.    Historical Provider, MD  traZODone (DESYREL) 150 MG tablet Take 300 mg by mouth at bedtime.    Historical Provider, MD  ziprasidone (GEODON) 80 MG capsule Take 80 mg by mouth every evening.    Historical Provider, MD   Triage Vitals: BP 197/80  Pulse 97  Temp(Src) 98.6 F (37 C) (Rectal)  Resp 26  Ht 6' (1.829 m)  Wt 289 lb (131.09 kg)  BMI 39.19 kg/m2  SpO2 94%  Physical Exam  Nursing note and vitals reviewed. Constitutional: He appears well-developed and well-nourished. No distress.  HENT:  Head: Normocephalic and atraumatic.  Eyes: Conjunctivae and EOM are normal.  Neck: Neck supple. No tracheal deviation present.  Cardiovascular: Normal rate and regular rhythm.   Pulmonary/Chest: Effort normal. No respiratory distress. He has decreased breath sounds.  Decreased air movement   Abdominal: He exhibits no distension.  Musculoskeletal: Normal range of motion.  Neurological: He is alert. No sensory deficit.  General tremor  Skin: Skin is warm and dry.  Indistinct redness of the face with generalized erythematous rash, non-petechial  Flat, erythematous rash to bilateral buttocks without drainage   Psychiatric: His behavior is normal. His mood appears anxious.    ED Course  Procedures (including critical care time)  DIAGNOSTIC STUDIES: Oxygen Saturation is 94% on [place], adeqaute by my interpretation.    COORDINATION OF CARE: 9:10 AM-Discussed treatment plan which includes CXR, CBC panel, CMP and UA with pt at bedside and pt agreed to plan. Code sepsis called.  10:56 AM- Blood gas was cancelled due to 4 failed attempts. Per mother, she last saw the pt 2 days ago. Today is he is somewhat less responsive but is able to communicate and talk with her. She feels like he is  more twitchy than normal. He was seen by his PCP 5 days ago and the PCP felt that he was taking too many pain pills. He advised the pt to cut back. Pt lives with his wife who is not here yet.  12:54 PM- Pt rechecked and is sleeping peacefully.  Mother states that the pt has gained 11 lbs in the past 11 days. He has an appointment in a few days with Endocrinologist for an insulin pump.   1:53 PM- Pt's wife present. States that he asked her to call because breathing was becoming more "shallow". Pt refused transport the first time. Wife states that the pt has  been having a problem with pain medication and ran out early last month after 2 weeks. She has been giving the pt her prescription until he could get a new prescription. Wife states that the pt has taken 41 pills in 4 days and has 139 in the bottle. Dr. [name] told the wife and pt that this was the last prescription, that he will have to f/u with pain management. The agreement was 6 a day but the pt has been taking more than normal due to the pt relaxing after taking it. He was going to pain management but left and has been f/u with his PCP. Wife states that the pt appears to be going through withdrawals.  He is shaking constantly and can't sit still. He has been aggressive with intermittent AMS. She states that at baseline the pt is oriented and "sharp". Wife also expresses concern over distended abdomen. She is unable to state whether rash is new saying that "he has chronic skin issues but doesn't follow up". Discussed admission with wife and wife agreed.  Nursing Notes Reviewed/ Care Coordinated Applicable Imaging Reviewed Interpretation of Laboratory Data incorporated into ED treatment Radiologic imaging report reviewed and images by radiology- viewed, by me.  Consultation_ 14:25- case discussed with hospitalist, he will see the patient for admission. I wrote holding orders.  CRITICAL CARE Performed by: Richarda Blade Total critical care time: 35  minutes Critical care time was exclusive of separately billable procedures and treating other patients. Critical care was necessary to treat or prevent imminent or life-threatening deterioration. Critical care was time spent personally by me on the following activities: development of treatment plan with patient and/or surrogate as well as nursing, discussions with consultants, evaluation of patient's response to treatment, examination of patient, obtaining history from patient or surrogate, ordering and performing treatments and interventions, ordering and review of laboratory studies, ordering and review of radiographic studies, pulse oximetry and re-evaluation of patient's condition.  Labs Review Labs Reviewed  CBC WITH DIFFERENTIAL - Abnormal; Notable for the following:    WBC 13.9 (*)    Hemoglobin 12.9 (*)    HCT 38.9 (*)    Neutrophils Relative % 85 (*)    Neutro Abs 11.7 (*)    Lymphocytes Relative 9 (*)    All other components within normal limits  COMPREHENSIVE METABOLIC PANEL - Abnormal; Notable for the following:    Sodium 119 (*)    Chloride 77 (*)    Glucose, Bld 228 (*)    All other components within normal limits  URINALYSIS, ROUTINE W REFLEX MICROSCOPIC - Abnormal; Notable for the following:    APPearance HAZY (*)    Glucose, UA 500 (*)    Bilirubin Urine SMALL (*)    Ketones, ur 15 (*)    Protein, ur 30 (*)    All other components within normal limits  PRO B NATRIURETIC PEPTIDE - Abnormal; Notable for the following:    Pro B Natriuretic peptide (BNP) 229.0 (*)    All other components within normal limits  URINE MICROSCOPIC-ADD ON - Abnormal; Notable for the following:    Squamous Epithelial / LPF FEW (*)    Bacteria, UA FEW (*)    All other components within normal limits  CULTURE, BLOOD (ROUTINE X 2)  CULTURE, BLOOD (ROUTINE X 2)  URINE CULTURE  TROPONIN I  I-STAT CG4 LACTIC ACID, ED    Imaging Review Dg Chest Port 1 View  01/02/2014   CLINICAL DATA:   Shortness of  breath.  EXAM: PORTABLE CHEST - 1 VIEW  FINDINGS: Cardiac enlargement. Mild diffuse pulmonary opacities suggesting early pulmonary edema. Worsening aeration from priors. Negative osseous structures.  IMPRESSION: Cardiac enlargement.  Suspected early pulmonary edema.   Electronically Signed   By: Rolla Flatten M.D.   On: 01/02/2014 09:26      Date: 01/02/14  Rate: 94  Rhythm: normal sinus rhythm and sinus arrhythmia  QRS Axis: normal  PR and QT Intervals: normal  ST/T Wave abnormalities: normal  PR and QRS Conduction Disutrbances:incomplete RBBB  Narrative Interpretation:   Old EKG Reviewed: changes noted  Since 04/19/13, rate slower    EKG Interpretation None      MDM   Final diagnoses:  Delirium  Chronic pain  Narcotic abuse  Hyperglycemia  Rash  Hyponatremia    Nursing Notes Reviewed/ Care Coordinated, and agree without changes. Applicable Imaging Reviewed.  Interpretation of Laboratory Data incorporated into ED treatment  I personally performed the services described in this documentation, which was scribed in my presence. The recorded information has been reviewed and is accurate.       Richarda Blade, MD 01/02/14 [id number]

## 2014-01-02 NOTE — ED Notes (Signed)
Rash to abdomen,  And back area.  Pt states this is normal for him.  Pt has depends on,  Sacral area red.

## 2014-01-02 NOTE — H&P (Signed)
History and Physical  Rick Cruz [id number] DOB: Sep 14, 1946 DOA: 01/02/2014  Referring physician: Dr. Eulis Foster in ED PCP: [name], MD   Chief Complaint: Confusion  HPI:  68 year old man with history of chronic pain syndrome, schizophrenia, diabetes mellitus presented to the emergency department at the insistence of his wife for increasing confusion. Initial evaluation suggested hyponatremia and acute encephalopathy/delirium related to narcotic overuse.  History obtained from wife at bedside. She insisted he come to the emergency department today because of confusion and abnormal behavior. Patient with history of chronic pain of neck and back on chronic narcotics. Maintained on fentanyl patch and Vicodin. For the last 4 days patient has been taking far more Vicodin than prescribed. According to pill count, wife estimates average pill intake 12 per day. He is also taking unspecified amounts of ibuprofen for pain. He has been intermittently confused for the last 4 days although he was sharp this morning. He has had poor appetite. He has eaten very little but has been drinking an excessive amount of water and tea. He has had no focal symptoms, infectious symptoms or fever. No seizures or loss of consciousness. Generalized tremor at times. History of alcohol dependence, long in remission. The patient is delirious and unable to provide history.  According to chart documentation,EMS removed 50 mcg fentanyl patch prior to arrival.  In the emergency department afebrile, hypertensive, normal respiratory rate and stable hypoxia. Sodium 119, chloride 77, troponin negative. Lactic acid normal. WBC 13.9. Urinalysis unremarkable. Blood cultures and urine culture drawn. Chest x-ray suspected early pulmonary edema.  Review of Systems:  Obtained from wife. Negative for fever, visual changes, sore throat, new muscle aches, chest pain, SOB, dysuria, bleeding, n/v/abdominal pain.  Positive for facial  flushing.  Past Medical History  Diagnosis Date  . COPD (chronic obstructive pulmonary disease)   . Diabetes mellitus without complication   . CHF (congestive heart failure)     diastolic   . Hypercholesterolemia   . Depression   . Schizophrenia   . Sleep apnea     noncompliant with BiPAP  . Tobacco abuse   . On home O2     3 liters  . Chronic pain     Past Surgical History  Procedure Laterality Date  . Knee surgery      X2  . 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   . Colonoscopy  July 2011    Dr. Benson Norway: multiple polyps, internal and external hemorrhoids, diverticulosis. tubular adenoma  . Esophagogastroduodenoscopy (egd) with propofol N/A 04/20/2013    Procedure: ESOPHAGOGASTRODUODENOSCOPY (EGD) WITH PROPOFOL;  Surgeon: Daneil Dolin, MD;  Location: [facility];  Service: Endoscopy;  Laterality: N/A;    Social History:  reports that he quit smoking about 3 months ago. His smoking use included Cigarettes. He has a 50 pack-year smoking history. He has never used smokeless tobacco. He reports that he does not drink alcohol or use illicit drugs.  Allergies  Allergen Reactions  . Phenergan [Promethazine Hcl] Other (See Comments)    Becomes very confused and aggressive    Family History  Problem Relation Age of Onset  . Colon cancer Neg Hx   . Parkinson's disease       Prior to Admission medications   Medication Sig Start Date End Date Taking? Authorizing Provider  Cinnamon 500 MG capsule Take 500 mg by mouth daily.   Yes Historical Provider, MD  furosemide (LASIX) 40 MG tablet Take 40 mg by mouth every other day. Patient takes Mon.,Wed.,Fri.,   Yes Historical Provider, MD  HYDROcodone-acetaminophen (NORCO) 7.5-325 MG per tablet Take 1 tablet by mouth 4 (four) times daily as needed for pain.   Yes Historical Provider, MD  Insulin Glargine (LANTUS SOLOSTAR) 100 UNIT/ML SOPN Inject 50 Units into  the skin every evening.    Yes Historical Provider, MD  insulin lispro (HUMALOG) 100 UNIT/ML injection Inject 14 Units into the skin 3 (three) times daily.   Yes Historical Provider, MD  Liraglutide (VICTOZA) 18 MG/3ML SOPN Inject 1.2 Units into the skin daily.   Yes Historical Provider, MD  metFORMIN (GLUCOPHAGE) 1000 MG tablet Take 1,000 mg by mouth 2 (two) times daily with a meal.    Yes Historical Provider, MD  metoprolol (LOPRESSOR) 50 MG tablet Take 50 mg by mouth 2 (two) times daily.   Yes Historical Provider, MD  pantoprazole (PROTONIX) 40 MG tablet Take 30- 60 min before your first and last meals of the day 05/06/13  Yes Tanda Rockers, MD  simvastatin (ZOCOR) 40 MG tablet Take 40 mg by mouth every evening.   Yes Historical Provider, MD  traZODone (DESYREL) 150 MG tablet Take 300 mg by mouth at bedtime.   Yes Historical Provider, MD  ziprasidone (GEODON) 80 MG capsule Take 80 mg by mouth every evening.   Yes Historical Provider, MD  albuterol (PROVENTIL HFA;VENTOLIN HFA) 108 (90 BASE) MCG/ACT inhaler Inhale 2 puffs into the lungs every 6 (six) hours as needed for wheezing or shortness of breath. 04/23/13   Samuella Cota, MD   Physical Exam: Filed Vitals:   01/02/14 1345 01/02/14 1400 01/02/14 1415 01/02/14 1500  BP: 169/99 161/96 164/86 180/135  Pulse: 95 93 95 94  Temp:      TempSrc:      Resp: 21 22 23 25   Height:      Weight:      SpO2: 93% 100% 93% 96%    General: Examined in the emergency department. Appears calm and comfortable but delirious, tremulous. Appears acutely ill but not toxic. Eyes: Pupils dilated significantly, pupils equal round and reactive, normal lids, irises  ENT: grossly normal hearing, lips & tongue Neck: no LAD, masses or thyromegaly Cardiovascular: RRR, no m/r/g. No LE edema. Respiratory: CTA bilaterally, no w/r/r. Normal respiratory effort. Abdomen: soft, obese, possibly distended, nontender. No hernia is noted. GU: Penis and scrotum appear  unremarkable. Perineum appears normal. Skin: Face is flushed without rash. Few areas of dry erythematous skin on trunk. Musculoskeletal: grossly normal tone BUE/BLE, moves all extremities to command. Tremulous. Psychiatric: Oriented to self, wife. Odd affect. Cannot assess mood. Neurologic: grossly non-focal. Follows commands. Moves all extremities. Foley catheter bag is 1400 mL pale yellow urine. No Lasix given in the emergency department.  Wt Readings from Last 3 Encounters:  01/02/14 131.09 kg (289 lb)  06/17/13 131.09 kg (289 lb)  05/06/13 131.543 kg (290 lb)    Labs on Admission:  Basic Metabolic Panel:  Recent Labs Lab 01/02/14 0925  NA 119*  K 4.6  CL 77*  CO2 26  GLUCOSE 228*  BUN 6  CREATININE 0.68  CALCIUM 9.0    Liver Function Tests:  Recent Labs Lab 01/02/14 0925  AST 15  ALT 10  ALKPHOS 73  BILITOT 0.7  PROT 6.8  ALBUMIN 3.7   CBC:  Recent Labs Lab 01/02/14 0925  WBC 13.9*  NEUTROABS 11.7*  HGB 12.9*  HCT 38.9*  MCV 86.4  PLT 223    Cardiac Enzymes:  Recent Labs Lab 01/02/14 0925  TROPONINI <0.30      Recent Labs  04/18/13 2012 01/02/14 0935  PROBNP 50.7 229.0*     Radiological Exams on Admission: Dg Chest Port 1 View  01/02/2014   CLINICAL DATA:  Shortness of breath.  EXAM: PORTABLE CHEST - 1 VIEW  FINDINGS: Cardiac enlargement. Mild diffuse pulmonary opacities suggesting early pulmonary edema. Worsening aeration from priors. Negative osseous structures.  IMPRESSION: Cardiac enlargement.  Suspected early pulmonary edema.   Electronically Signed   By: Rolla Flatten M.D.   On: 01/02/2014 09:26    EKG: Independently reviewed. There also is rhythm, no acute changes seen. Incomplete right bundle branch block pattern.   Active Problems:   Delirium   Assessment/Plan 1. Acute encephalopathy with delirium, likely toxic from narcotics. Lactic acid normal, chest x-ray unremarkable, U/A negatove. Leukocytosis likely stress  response. No history of fever or infectious symptoms. Monitor clinically. Suspect generalized tremor is related to delirium. Consider Geodon related adverse reaction. No seizure like activity. Withdrawal unlikely by history. 2. Narcotic abuse, overuse. 12 pills of vicodin with 325mg  Tylenol = 3.9 gm so Tylenol injury doubted.  3. Hyponatremia, hypochloremia, likely secondary to potamania, poor solute intake. Patient alert and calm, no indication at this point for aggressive correction. Doubt reaction to Trazodone. 4. Consider NMS (however no fever or rigidity, normal LFTs), serotonin syndrome (Geodon + Trazodone, however no GI symptoms)   5. Nonspecific flushing of the face. No evidence of significant rash at this point. 6. Chronic pain syndrome. Hold fentanyl patch and Vicodin but may need narcotics to prevent withdrawal. 7. Diabetes mellitus suspect uncontrolled by history 8. Schizophrenia. Hold Geodon for now. 9. Obstructive sleep apnea noncompliant with CPAP/BiPAP 10. Chronic hypoxic respiratory failure.   Prognosis guarded but currently stable. Plan admission to step down unit for supportive care, slow correction of hyponatremia. Presentation likely related to overuse of narcotics primarily, possibly complicated by hyponatremia. NMS and serotonin syndrome considered but not likely. Infection is not suspected at this point.  Further evaluation with CT head and abdominal x-ray, Urine drug screen, salicylate level, Tylenol level, INR, CK  Serial BMP   Serial neuro assessments  Ativan as needed  SSI  I discussed current diagnoses, prognosis and treatment plan with wife in detail and can be patient is critically ill.  Code Status: full code  DVT prophylaxis:Lovenox pending head CT Family Communication:  Disposition Plan/Anticipated LOS: admit, 3-4 days  Time spent: 90 minutes  Murray Hodgkins, MD  [facility] Pager [phone] 01/02/2014, 3:13 PM

## 2014-01-03 DIAGNOSIS — F1310 Sedative, hypnotic or anxiolytic abuse, uncomplicated: Secondary | ICD-10-CM

## 2014-01-03 DIAGNOSIS — G8929 Other chronic pain: Secondary | ICD-10-CM

## 2014-01-03 DIAGNOSIS — J9610 Chronic respiratory failure, unspecified whether with hypoxia or hypercapnia: Secondary | ICD-10-CM

## 2014-01-03 DIAGNOSIS — G9340 Encephalopathy, unspecified: Secondary | ICD-10-CM

## 2014-01-03 DIAGNOSIS — E119 Type 2 diabetes mellitus without complications: Secondary | ICD-10-CM

## 2014-01-03 LAB — BASIC METABOLIC PANEL
BUN: 6 mg/dL (ref 6–23)
BUN: 6 mg/dL (ref 6–23)
BUN: 6 mg/dL (ref 6–23)
BUN: 6 mg/dL (ref 6–23)
CALCIUM: 8.4 mg/dL (ref 8.4–10.5)
CHLORIDE: 84 meq/L — AB (ref 96–112)
CO2: 28 mEq/L (ref 19–32)
CO2: 27 mEq/L (ref 19–32)
CO2: 28 mEq/L (ref 19–32)
CO2: 27 mEq/L (ref 19–32)
CREATININE: 0.65 mg/dL (ref 0.50–1.35)
CREATININE: 0.61 mg/dL (ref 0.50–1.35)
Calcium: 8.5 mg/dL (ref 8.4–10.5)
Calcium: 8.4 mg/dL (ref 8.4–10.5)
Calcium: 8.4 mg/dL (ref 8.4–10.5)
Chloride: 85 mEq/L — ABNORMAL LOW (ref 96–112)
Chloride: 86 mEq/L — ABNORMAL LOW (ref 96–112)
Chloride: 83 mEq/L — ABNORMAL LOW (ref 96–112)
Creatinine, Ser: 0.64 mg/dL (ref 0.50–1.35)
Creatinine, Ser: 0.63 mg/dL (ref 0.50–1.35)
GFR calc Af Amer: >90 mL/min (ref >90)
GFR calc Af Amer: >90 mL/min (ref >90)
GFR calc Af Amer: >90 mL/min (ref >90)
GFR calc non Af Amer: >90 mL/min (ref >90)
GLUCOSE: 136 mg/dL — AB (ref 70–99)
Glucose, Bld: 129 mg/dL — ABNORMAL HIGH (ref 70–99)
Glucose, Bld: 153 mg/dL — ABNORMAL HIGH (ref 70–99)
Glucose, Bld: 183 mg/dL — ABNORMAL HIGH (ref 70–99)
POTASSIUM: 4.8 meq/L (ref 3.7–5.3)
POTASSIUM: 4.8 meq/L (ref 3.7–5.3)
Potassium: 4.8 mEq/L (ref 3.7–5.3)
Potassium: 4.2 mEq/L (ref 3.7–5.3)
SODIUM: 124 meq/L — AB (ref 137–147)
SODIUM: 120 meq/L — AB (ref 137–147)
Sodium: 123 mEq/L — ABNORMAL LOW (ref 137–147)
Sodium: 121 mEq/L — CL (ref 137–147)

## 2014-01-03 LAB — GLUCOSE, CAPILLARY
GLUCOSE-CAPILLARY: 112 mg/dL — AB (ref 70–99)
Glucose-Capillary: 120 mg/dL — ABNORMAL HIGH (ref 70–99)
Glucose-Capillary: 124 mg/dL — ABNORMAL HIGH (ref 70–99)
Glucose-Capillary: 140 mg/dL — ABNORMAL HIGH (ref 70–99)
Glucose-Capillary: 144 mg/dL — ABNORMAL HIGH (ref 70–99)
Glucose-Capillary: 192 mg/dL — ABNORMAL HIGH (ref 70–99)

## 2014-01-03 LAB — COMPREHENSIVE METABOLIC PANEL
ALBUMIN: 3.2 g/dL — AB (ref 3.5–5.2)
ALT: 10 U/L (ref 0–53)
AST: 17 U/L (ref 0–37)
Alkaline Phosphatase: 66 U/L (ref 39–117)
BUN: 6 mg/dL (ref 6–23)
CO2: 29 mEq/L (ref 19–32)
Calcium: 8.5 mg/dL (ref 8.4–10.5)
Chloride: 86 mEq/L — ABNORMAL LOW (ref 96–112)
Creatinine, Ser: 0.65 mg/dL (ref 0.50–1.35)
GFR calc Af Amer: >90 mL/min (ref >90)
GFR calc non Af Amer: >90 mL/min (ref >90)
Glucose, Bld: 128 mg/dL — ABNORMAL HIGH (ref 70–99)
Potassium: 4.7 mEq/L (ref 3.7–5.3)
SODIUM: 123 meq/L — AB (ref 137–147)
TOTAL PROTEIN: 6.2 g/dL (ref 6.0–8.3)
Total Bilirubin: 0.6 mg/dL (ref 0.3–1.2)

## 2014-01-03 LAB — CBC
HCT: 36.3 % — ABNORMAL LOW (ref 39.0–52.0)
Hemoglobin: 12 g/dL — ABNORMAL LOW (ref 13.0–17.0)
MCH: 28.5 pg (ref 26.0–34.0)
MCHC: 33.1 g/dL (ref 30.0–36.0)
MCV: 86.2 fL (ref 78.0–100.0)
Platelets: 193 10*3/uL (ref 150–400)
RBC: 4.21 MIL/uL — ABNORMAL LOW (ref 4.22–5.81)
RDW: 12.5 % (ref 11.5–15.5)
WBC: 10.4 10*3/uL (ref 4.0–10.5)

## 2014-01-03 LAB — URINE CULTURE
COLONY COUNT: NO GROWTH
Culture: NO GROWTH

## 2014-01-03 LAB — CK: Total CK: 320 U/L — ABNORMAL HIGH (ref 7–232)

## 2014-01-03 MED ORDER — ENOXAPARIN SODIUM 40 MG/0.4ML ~~LOC~~ SOLN
40.0000 mg | Freq: Every day | SUBCUTANEOUS | Status: DC
  Administered 2014-01-03 – 2014-01-04 (×2): 40 mg via SUBCUTANEOUS
  Filled 2014-01-03 (×2): qty 0.4

## 2014-01-03 MED ORDER — PNEUMOCOCCAL VAC POLYVALENT 25 MCG/0.5ML IJ INJ
0.5000 mL | INJECTION | INTRAMUSCULAR | Status: AC
  Administered 2014-01-04: 0.5 mL via INTRAMUSCULAR
  Filled 2014-01-03: qty 0.5

## 2014-01-03 NOTE — Progress Notes (Signed)
Patient transferred to room 311. Report given to Tedd Sias RN. Vital signs stable at transfer.

## 2014-01-03 NOTE — Progress Notes (Signed)
CRITICAL VALUE ALERT  Critical value received:  Sodium 121  Date of notification:  01/03/2014  Time of notification:  [id number]  Critical value read back:yes  Nurse who received alert:  Lenon Curt RN  MD notified (1st page):  Dr Sarajane Jews  Time of first page:  [phone]  MD notified (2nd page):  Time of second page:  Responding MD:  Dr Sarajane Jews  Time MD responded:  [phone]

## 2014-01-03 NOTE — Progress Notes (Signed)
Pt has hx of sleep apnea per H&P. Pt saturations droppiong into low 80s while asleep. E-link MD placed pt on BiPAP PRN for sleep apnea.

## 2014-01-03 NOTE — Progress Notes (Signed)
PROGRESS NOTE  Rick Cruz [id number] DOB: 1946-07-25 DOA: 01/02/2014 PCP: [name], MD  Summary: 68 year old man with history of chronic pain syndrome, schizophrenia, diabetes mellitus presented to the emergency department at the insistence of his wife for increasing confusion. Initial evaluation suggested hyponatremia and acute encephalopathy/delirium related to narcotic overuse.  Assessment/Plan: 1. Acute toxic encephalopathy with delirium. She appears clinically resolved at this point. Infectious workup negative. Likely gastritis normal. Leukocytosis has resolved. No fever. Likely related to narcotic overuse. Consider adverse reaction from Geodon. No evidence of NMS or serotonin syndrome.  2. Narcotic abuse, overuse. No complaints of pain at this point. Currently stable. Tylenol level is normal. LFTs normal. INR normal. 3. Hyponatremia, hypochloremia, suspect secondary to potomania, poor solute intake. Mentation much improved. 4. Chronic pain syndrome. Hold narcotics for now. May need to reinstitute to prevent withdrawal. 5. Diabetes mellitus, suspect uncontrolled. Hemoglobin A1c pending. Currently stable.  6. Schizophrenia. Stable. Hold Geodon for now. 7. Obstructive sleep apnea, noncompliant with CPAP. 8. Chronic hypoxic respiratory failure, stable.   Much improved today with near resolution of encephalopathy. Anticipate transfer to the floor later today.  Continue IV fluids for slow correction of hyponatremia. Continue serial BMP.  Anticipate discharge next one to 2 days.  Code Status: full code DVT prophylaxis: Lovenox Family Communication:  Disposition Plan:   Murray Hodgkins, MD  [facility]  Pager [phone] If 7PM-7AM, please contact night-coverage at [email or web address], password [facility] 01/03/2014, 10:09 AM  LOS: 1 day   Consultants:    Procedures:    Antibiotics:    HPI/Subjective: No issues overnight. Feels ok this morning. No specific  complaints.  Objective: Filed Vitals:   01/03/14 0723 01/03/14 0800 01/03/14 0900 01/03/14 1000  BP: 172/92 175/121 171/73   Pulse: 71 81 68 81  Temp:      TempSrc:      Resp: 14 23 12 25   Height:      Weight:      SpO2: 94% 99% 90% 100%    Intake/Output Summary (Last 24 hours) at 01/03/14 1009 Last data filed at 01/03/14 0600  Gross per 24 hour  Intake   1020 ml  Output   2400 ml  Net  -1380 ml     Filed Weights   01/02/14 0907 01/02/14 1634 01/03/14 0500  Weight: 131.09 kg (289 lb) 131.2 kg (289 lb 3.9 oz) 132.3 kg (291 lb 10.7 oz)    Exam:   Afebrile, vital signs are stable.  Gen. Appears calm and comfortable. Very alert. Appears much better.  Psychiatric. Alert, oriented to self, location, month, year. Speech fluent and appropriate.  Eyes appear grossly unremarkable.  Cardiovascular. Regular rate and rhythm. No murmur, rub or gallop. 1+ bilateral lower extremity edema. Telemetry sinus rhythm.  Respiratory clear to auscultation bilaterally. No wheezes, rales or rhonchi. Normal respiratory effort.  Abdomen soft, nontender, nondistended. Obese.  Skin appears grossly unremarkable. No significant rash.  Data Reviewed:  Excellent urine output, 2400.  Capillary blood sugars stable.  Sodium modestly improved from 123. Chloride improving.  LFTs unremarkable.  Total CK improved, 320.  Leukocytosis resolved. Hemoglobin stable 12.0.   CT head unremarkable. Abdominal x-ray unremarkable.  Scheduled Meds: . insulin aspart  0-9 Units Subcutaneous TID WC  . insulin glargine  30 Units Subcutaneous QPM  . metoprolol  50 mg Oral BID  . pantoprazole  40 mg Oral Daily  . [START ON 01/04/2014] pneumococcal 23 valent vaccine  0.5 mL Intramuscular Tomorrow-1000  . sodium chloride  3  mL Intravenous Q12H   Continuous Infusions: . sodium chloride 100 mL/hr at 01/03/14 [id number]    Active Problems:   Delirium   Acute encephalopathy   Time spent 25  minutes

## 2014-01-04 DIAGNOSIS — A0472 Enterocolitis due to Clostridium difficile, not specified as recurrent: Secondary | ICD-10-CM

## 2014-01-04 DIAGNOSIS — E871 Hypo-osmolality and hyponatremia: Secondary | ICD-10-CM

## 2014-01-04 LAB — OSMOLALITY, URINE: OSMOLALITY UR: 435 mosm/kg (ref 390–1090)

## 2014-01-04 LAB — GLUCOSE, CAPILLARY
Glucose-Capillary: 160 mg/dL — ABNORMAL HIGH (ref 70–99)
Glucose-Capillary: 186 mg/dL — ABNORMAL HIGH (ref 70–99)
Glucose-Capillary: 118 mg/dL — ABNORMAL HIGH (ref 70–99)
Glucose-Capillary: 150 mg/dL — ABNORMAL HIGH (ref 70–99)

## 2014-01-04 LAB — BASIC METABOLIC PANEL
BUN: 4 mg/dL — ABNORMAL LOW (ref 6–23)
BUN: 3 mg/dL — AB (ref 6–23)
BUN: 3 mg/dL — AB (ref 6–23)
CALCIUM: 8.5 mg/dL (ref 8.4–10.5)
CO2: 27 meq/L (ref 19–32)
CO2: 30 mEq/L (ref 19–32)
CO2: 29 mEq/L (ref 19–32)
CREATININE: 0.59 mg/dL (ref 0.50–1.35)
CREATININE: 0.53 mg/dL (ref 0.50–1.35)
Calcium: 8.5 mg/dL (ref 8.4–10.5)
Calcium: 8.2 mg/dL — ABNORMAL LOW (ref 8.4–10.5)
Chloride: 84 mEq/L — ABNORMAL LOW (ref 96–112)
Chloride: 80 mEq/L — ABNORMAL LOW (ref 96–112)
Chloride: 77 mEq/L — ABNORMAL LOW (ref 96–112)
Creatinine, Ser: 0.56 mg/dL (ref 0.50–1.35)
GFR calc Af Amer: >90 mL/min (ref >90)
GFR calc Af Amer: >90 mL/min (ref >90)
GFR calc non Af Amer: >90 mL/min (ref >90)
GLUCOSE: 150 mg/dL — AB (ref 70–99)
GLUCOSE: 169 mg/dL — AB (ref 70–99)
GLUCOSE: 157 mg/dL — AB (ref 70–99)
Potassium: 4.1 mEq/L (ref 3.7–5.3)
Potassium: 4.3 mEq/L (ref 3.7–5.3)
Potassium: 4 mEq/L (ref 3.7–5.3)
Sodium: 122 mEq/L — ABNORMAL LOW (ref 137–147)
Sodium: 119 mEq/L — CL (ref 137–147)
Sodium: 116 mEq/L — CL (ref 137–147)

## 2014-01-04 LAB — CLOSTRIDIUM DIFFICILE BY PCR: Toxigenic C. Difficile by PCR: POSITIVE — AB

## 2014-01-04 LAB — URIC ACID: Uric Acid, Serum: 3.8 mg/dL — ABNORMAL LOW (ref 4.0–7.8)

## 2014-01-04 LAB — SODIUM, URINE, RANDOM: SODIUM UR: 91 meq/L

## 2014-01-04 MED ORDER — METRONIDAZOLE 500 MG PO TABS
500.0000 mg | ORAL_TABLET | Freq: Three times a day (TID) | ORAL | Status: DC
  Administered 2014-01-04 – 2014-01-10 (×18): 500 mg via ORAL
  Filled 2014-01-04 (×18): qty 1

## 2014-01-04 MED ORDER — FUROSEMIDE 10 MG/ML IJ SOLN
20.0000 mg | Freq: Two times a day (BID) | INTRAMUSCULAR | Status: DC
  Administered 2014-01-04 – 2014-01-08 (×9): 20 mg via INTRAVENOUS
  Filled 2014-01-04 (×9): qty 2

## 2014-01-04 MED ORDER — ENOXAPARIN SODIUM 60 MG/0.6ML ~~LOC~~ SOLN
60.0000 mg | Freq: Every day | SUBCUTANEOUS | Status: DC
  Administered 2014-01-05 – 2014-01-10 (×6): 60 mg via SUBCUTANEOUS
  Filled 2014-01-04 (×6): qty 0.6

## 2014-01-04 MED ORDER — SODIUM CHLORIDE 0.9 % IV SOLN
INTRAVENOUS | Status: DC
  Administered 2014-01-04: 13:00:00 via INTRAVENOUS

## 2014-01-04 MED ORDER — METRONIDAZOLE 500 MG PO TABS
250.0000 mg | ORAL_TABLET | Freq: Three times a day (TID) | ORAL | Status: DC
  Administered 2014-01-04 (×2): 250 mg via ORAL
  Filled 2014-01-04 (×2): qty 1

## 2014-01-04 NOTE — Progress Notes (Signed)
CRITICAL VALUE ALERT  Critical value received: Sodium=120  Date of notification:  01/03/2014  Time of notification:  2205  Critical value read back: Yes  Nurse who received alert:  T.James  MD notified (1st page):  Yes, Mid level  Time of first page:  2207  MD notified (2nd page):  Time of second page:  Responding MD:  Mid level  Time MD responded:  2210

## 2014-01-04 NOTE — Progress Notes (Signed)
CRITICAL VALUE ALERT  Critical value received:NA 119  Date of notification: 01/04/2014  Time of notification: 1225  Critical value read back:yes  Nurse who received alert: [email or web address]  MD notified (1st page): 1225  Time of first page:1225  MD notified (2nd page):  Time of second page:  Responding MD: Dr.Goodrich  Time MD responded:1225

## 2014-01-04 NOTE — Consult Note (Signed)
Reason for Consult: Hyponatremia Referring Physician: Dr. Rochel Cruz is an 68 y.o. male.  HPI: He is a patient who has chronic pain syndrome, sleep apnea, schizophrenia presently was brought to the hospital because of increase somnolence and confusion. When patient was evaluated in emergency room he was found to have hyponatremia hence admitted to the hospital. Presently patient is somnolent but arousable. Patient doesn't however sustained a reasonable conversation. Every time he wakes up he goes back to sleep without answering most of  the questions. Presently patient does not seemed to have difficulty breathing.  Past Medical History  Diagnosis Date  . COPD (chronic obstructive pulmonary disease)   . Diabetes mellitus without complication   . CHF (congestive heart failure)     diastolic   . Hypercholesterolemia   . Depression   . Schizophrenia   . Sleep apnea     noncompliant with BiPAP  . Tobacco abuse   . On home O2     3 liters  . Chronic pain     Past Surgical History  Procedure Laterality Date  . Knee surgery      X2  . 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   . Colonoscopy  July 2011    Dr. Benson Cruz: multiple polyps, internal and external hemorrhoids, diverticulosis. tubular adenoma  . Esophagogastroduodenoscopy (egd) with propofol N/A 04/20/2013    Procedure: ESOPHAGOGASTRODUODENOSCOPY (EGD) WITH PROPOFOL;  Surgeon: Rick Cruz;  Location: [facility];  Service: Endoscopy;  Laterality: N/A;    Family History  Problem Relation Age of Onset  . Colon cancer Neg Hx   . Parkinson's disease      Social History:  reports that he quit smoking about 3 months ago. His smoking use included Cigarettes. He has a 50 pack-year smoking history. He has never used smokeless tobacco. He reports that he does not drink alcohol or use illicit drugs.  Allergies:  Allergies  Allergen Reactions   . Phenergan [Promethazine Hcl] Other (See Comments)    Becomes very confused and aggressive    Medications: I have reviewed the patient's current medications.  Results for orders placed during the hospital encounter of 01/02/14 (from the past 48 hour(s))  CULTURE, BLOOD (ROUTINE X 2)     Status: None   Collection Time    01/02/14  9:25 AM      Result Value Ref Range   Specimen Description LEFT ANTECUBITAL     Special Requests BOTTLES DRAWN AEROBIC AND ANAEROBIC 8CC EACH     Culture NO GROWTH 1 DAY     Report Status PENDING    CULTURE, BLOOD (ROUTINE X 2)     Status: None   Collection Time    01/02/14  9:25 AM      Result Value Ref Range   Specimen Description RIGHT ANTECUBITAL     Special Requests BOTTLES DRAWN AEROBIC AND ANAEROBIC 6CC EACH     Culture NO GROWTH 1 DAY     Report Status PENDING    CBC WITH DIFFERENTIAL     Status: Abnormal   Collection Time    01/02/14  9:25 AM      Result Value Ref Range   WBC 13.9 (*) 4.0 - 10.5 K/uL   RBC 4.50  4.22 - 5.81 MIL/uL   Hemoglobin 12.9 (*) 13.0 - 17.0 g/dL   HCT 38.9 (*) 39.0 - 52.0 %  MCV 86.4  78.0 - 100.0 fL   MCH 28.7  26.0 - 34.0 pg   MCHC 33.2  30.0 - 36.0 g/dL   RDW 12.5  11.5 - 15.5 %   Platelets 223  150 - 400 K/uL   Neutrophils Relative % 85 (*) 43 - 77 %   Neutro Abs 11.7 (*) 1.7 - 7.7 K/uL   Lymphocytes Relative 9 (*) 12 - 46 %   Lymphs Abs 1.2  0.7 - 4.0 K/uL   Monocytes Relative 6  3 - 12 %   Monocytes Absolute 0.9  0.1 - 1.0 K/uL   Eosinophils Relative 0  0 - 5 %   Eosinophils Absolute 0.1  0.0 - 0.7 K/uL   Basophils Relative 0  0 - 1 %   Basophils Absolute 0.0  0.0 - 0.1 K/uL  COMPREHENSIVE METABOLIC PANEL     Status: Abnormal   Collection Time    01/02/14  9:25 AM      Result Value Ref Range   Sodium 119 (*) 137 - 147 mEq/L   Comment: CRITICAL RESULT CALLED TO, READ BACK BY AND VERIFIED WITH:     INGRAM T. AT 1011A ON [id number] BY THOMPSON S.   Potassium 4.6  3.7 - 5.3 mEq/L   Chloride 77 (*) 96 -  112 mEq/L   CO2 26  19 - 32 mEq/L   Glucose, Bld 228 (*) 70 - 99 mg/dL   BUN 6  6 - 23 mg/dL   Creatinine, Ser 0.68  0.50 - 1.35 mg/dL   Calcium 9.0  8.4 - 10.5 mg/dL   Total Protein 6.8  6.0 - 8.3 g/dL   Albumin 3.7  3.5 - 5.2 g/dL   AST 15  0 - 37 U/L   ALT 10  0 - 53 U/L   Alkaline Phosphatase 73  39 - 117 U/L   Total Bilirubin 0.7  0.3 - 1.2 mg/dL   GFR calc non Af Amer >90  >90 mL/min   GFR calc Af Amer >90  >90 mL/min   Comment: (NOTE)     The eGFR has been calculated using the CKD EPI equation.     This calculation has not been validated in all clinical situations.     eGFR's persistently <90 mL/min signify possible Chronic Kidney     Disease.  TROPONIN I     Status: None   Collection Time    01/02/14  9:25 AM      Result Value Ref Range   Troponin I <0.30  <0.30 ng/mL   Comment:            Due to the release kinetics of cTnI,     a negative result within the first hours     of the onset of symptoms does not rule out     myocardial infarction with certainty.     If myocardial infarction is still suspected,     repeat the test at appropriate intervals.  ACETAMINOPHEN LEVEL     Status: None   Collection Time    01/02/14  9:25 AM      Result Value Ref Range   Acetaminophen (Tylenol), Serum <15.0  10 - 30 ug/mL   Comment:            THERAPEUTIC CONCENTRATIONS VARY     SIGNIFICANTLY. A RANGE OF 10-30     ug/mL MAY BE AN EFFECTIVE     CONCENTRATION FOR MANY PATIENTS.     HOWEVER,  SOME ARE BEST TREATED     AT CONCENTRATIONS OUTSIDE THIS     RANGE.     ACETAMINOPHEN CONCENTRATIONS     >150 ug/mL AT 4 HOURS AFTER     INGESTION AND >50 ug/mL AT 12     HOURS AFTER INGESTION ARE     OFTEN ASSOCIATED WITH TOXIC     REACTIONS.  SALICYLATE LEVEL     Status: Abnormal   Collection Time    01/02/14  9:25 AM      Result Value Ref Range   Salicylate Lvl <1.9 (*) 2.8 - 20.0 mg/dL  I-STAT CG4 LACTIC ACID, ED     Status: None   Collection Time    01/02/14  9:30 AM       Result Value Ref Range   Lactic Acid, Venous 1.76  0.5 - 2.2 mmol/L  PRO B NATRIURETIC PEPTIDE     Status: Abnormal   Collection Time    01/02/14  9:35 AM      Result Value Ref Range   Pro B Natriuretic peptide (BNP) 229.0 (*) 0 - 125 pg/mL  URINALYSIS, ROUTINE W REFLEX MICROSCOPIC     Status: Abnormal   Collection Time    01/02/14 10:00 AM      Result Value Ref Range   Color, Urine YELLOW  YELLOW   APPearance HAZY (*) CLEAR   Specific Gravity, Urine 1.020  1.005 - 1.030   pH 6.0  5.0 - 8.0   Glucose, UA 500 (*) NEGATIVE mg/dL   Hgb urine dipstick NEGATIVE  NEGATIVE   Bilirubin Urine SMALL (*) NEGATIVE   Ketones, ur 15 (*) NEGATIVE mg/dL   Protein, ur 30 (*) NEGATIVE mg/dL   Urobilinogen, UA 0.2  0.0 - 1.0 mg/dL   Nitrite NEGATIVE  NEGATIVE   Leukocytes, UA NEGATIVE  NEGATIVE  URINE CULTURE     Status: None   Collection Time    01/02/14 10:00 AM      Result Value Ref Range   Specimen Description URINE, CATHETERIZED     Special Requests NONE     Culture  Setup Time       Value: 01/02/2014 22:56     Performed at [facility]       Value: NO GROWTH     Performed at [facility]   Culture       Value: NO GROWTH     Performed at [facility]   Report Status 01/03/2014 FINAL    URINE MICROSCOPIC-ADD ON     Status: Abnormal   Collection Time    01/02/14 10:00 AM      Result Value Ref Range   Squamous Epithelial / LPF FEW (*) RARE   WBC, UA 0-2  <3 WBC/hpf   RBC / HPF 0-2  <3 RBC/hpf   Bacteria, UA FEW (*) RARE  URINE RAPID DRUG SCREEN (HOSP PERFORMED)     Status: Abnormal   Collection Time    01/02/14  3:11 PM      Result Value Ref Range   Opiates POSITIVE (*) NONE DETECTED   Cocaine NONE DETECTED  NONE DETECTED   Benzodiazepines NONE DETECTED  NONE DETECTED   Amphetamines NONE DETECTED  NONE DETECTED   Tetrahydrocannabinol NONE DETECTED  NONE DETECTED   Barbiturates NONE DETECTED  NONE DETECTED   Comment:            DRUG  SCREEN FOR MEDICAL PURPOSES     ONLY.  IF CONFIRMATION IS NEEDED     FOR ANY PURPOSE, NOTIFY LAB     WITHIN 5 DAYS.                LOWEST DETECTABLE LIMITS     FOR URINE DRUG SCREEN     Drug Class       Cutoff (ng/mL)     Amphetamine      1000     Barbiturate      200     Benzodiazepine   211     Tricyclics       941     Opiates          300     Cocaine          300     THC              50  CK     Status: Abnormal   Collection Time    01/02/14  3:53 PM      Result Value Ref Range   Total CK 395 (*) 7 - 232 U/L  ETHANOL     Status: None   Collection Time    01/02/14  3:53 PM      Result Value Ref Range   Alcohol, Ethyl (B) <11  0 - 11 mg/dL   Comment:            LOWEST DETECTABLE LIMIT FOR     SERUM ALCOHOL IS 11 mg/dL     FOR MEDICAL PURPOSES ONLY  PROTIME-INR     Status: None   Collection Time    01/02/14  3:53 PM      Result Value Ref Range   Prothrombin Time 12.7  11.6 - 15.2 seconds   INR 0.97  0.00 - 7.40  BASIC METABOLIC PANEL     Status: Abnormal   Collection Time    01/02/14  3:53 PM      Result Value Ref Range   Sodium 122 (*) 137 - 147 mEq/L   Potassium 4.7  3.7 - 5.3 mEq/L   Chloride 84 (*) 96 - 112 mEq/L   Comment: DELTA CHECK NOTED   CO2 26  19 - 32 mEq/L   Glucose, Bld 176 (*) 70 - 99 mg/dL   BUN 5 (*) 6 - 23 mg/dL   Creatinine, Ser 0.66  0.50 - 1.35 mg/dL   Calcium 8.4  8.4 - 10.5 mg/dL   GFR calc non Af Amer >90  >90 mL/min   GFR calc Af Amer >90  >90 mL/min   Comment: (NOTE)     The eGFR has been calculated using the CKD EPI equation.     This calculation has not been validated in all clinical situations.     eGFR's persistently <90 mL/min signify possible Chronic Kidney     Disease.  AMMONIA     Status: None   Collection Time    01/02/14  3:54 PM      Result Value Ref Range   Ammonia 22  11 - 60 umol/L  GLUCOSE, CAPILLARY     Status: Abnormal   Collection Time    01/02/14  4:56 PM      Result Value Ref Range   Glucose-Capillary 167 (*)  70 - 99 mg/dL  BASIC METABOLIC PANEL     Status: Abnormal   Collection Time    01/02/14  6:18 PM      Result Value Ref Range   Sodium 123 (*)  137 - 147 mEq/L   Potassium 4.7  3.7 - 5.3 mEq/L   Chloride 85 (*) 96 - 112 mEq/L   CO2 28  19 - 32 mEq/L   Glucose, Bld 163 (*) 70 - 99 mg/dL   BUN 5 (*) 6 - 23 mg/dL   Creatinine, Ser 0.68  0.50 - 1.35 mg/dL   Calcium 8.5  8.4 - 10.5 mg/dL   GFR calc non Af Amer >90  >90 mL/min   GFR calc Af Amer >90  >90 mL/min   Comment: (NOTE)     The eGFR has been calculated using the CKD EPI equation.     This calculation has not been validated in all clinical situations.     eGFR's persistently <90 mL/min signify possible Chronic Kidney     Disease.  GLUCOSE, CAPILLARY     Status: Abnormal   Collection Time    01/02/14  6:23 PM      Result Value Ref Range   Glucose-Capillary 156 (*) 70 - 99 mg/dL   Comment 1 Notify RN    GLUCOSE, CAPILLARY     Status: Abnormal   Collection Time    01/02/14  9:22 PM      Result Value Ref Range   Glucose-Capillary 126 (*) 70 - 99 mg/dL   Comment 1 Notify RN    BASIC METABOLIC PANEL     Status: Abnormal   Collection Time    01/02/14 10:09 PM      Result Value Ref Range   Sodium 124 (*) 137 - 147 mEq/L   Potassium 4.5  3.7 - 5.3 mEq/L   Chloride 85 (*) 96 - 112 mEq/L   CO2 29  19 - 32 mEq/L   Glucose, Bld 130 (*) 70 - 99 mg/dL   BUN 5 (*) 6 - 23 mg/dL   Creatinine, Ser 0.69  0.50 - 1.35 mg/dL   Calcium 8.4  8.4 - 10.5 mg/dL   GFR calc non Af Amer >90  >90 mL/min   GFR calc Af Amer >90  >90 mL/min   Comment: (NOTE)     The eGFR has been calculated using the CKD EPI equation.     This calculation has not been validated in all clinical situations.     eGFR's persistently <90 mL/min signify possible Chronic Kidney     Disease.  GLUCOSE, CAPILLARY     Status: Abnormal   Collection Time    01/03/14 12:23 AM      Result Value Ref Range   Glucose-Capillary 124 (*) 70 - 99 mg/dL  BASIC METABOLIC PANEL      Status: Abnormal   Collection Time    01/03/14  2:03 AM      Result Value Ref Range   Sodium 123 (*) 137 - 147 mEq/L   Potassium 4.8  3.7 - 5.3 mEq/L   Chloride 85 (*) 96 - 112 mEq/L   CO2 28  19 - 32 mEq/L   Glucose, Bld 136 (*) 70 - 99 mg/dL   BUN 6  6 - 23 mg/dL   Creatinine, Ser 0.64  0.50 - 1.35 mg/dL   Calcium 8.4  8.4 - 10.5 mg/dL   GFR calc non Af Amer >90  >90 mL/min   GFR calc Af Amer >90  >90 mL/min   Comment: (NOTE)     The eGFR has been calculated using the CKD EPI equation.     This calculation has not been validated in all clinical situations.  eGFR's persistently <90 mL/min signify possible Chronic Kidney     Disease.  CK     Status: Abnormal   Collection Time    01/03/14  5:12 AM      Result Value Ref Range   Total CK 320 (*) 7 - 232 U/L  COMPREHENSIVE METABOLIC PANEL     Status: Abnormal   Collection Time    01/03/14  5:12 AM      Result Value Ref Range   Sodium 123 (*) 137 - 147 mEq/L   Potassium 4.7  3.7 - 5.3 mEq/L   Chloride 86 (*) 96 - 112 mEq/L   CO2 29  19 - 32 mEq/L   Glucose, Bld 128 (*) 70 - 99 mg/dL   BUN 6  6 - 23 mg/dL   Creatinine, Ser 0.65  0.50 - 1.35 mg/dL   Calcium 8.5  8.4 - 10.5 mg/dL   Total Protein 6.2  6.0 - 8.3 g/dL   Albumin 3.2 (*) 3.5 - 5.2 g/dL   AST 17  0 - 37 U/L   ALT 10  0 - 53 U/L   Alkaline Phosphatase 66  39 - 117 U/L   Total Bilirubin 0.6  0.3 - 1.2 mg/dL   GFR calc non Af Amer >90  >90 mL/min   GFR calc Af Amer >90  >90 mL/min   Comment: (NOTE)     The eGFR has been calculated using the CKD EPI equation.     This calculation has not been validated in all clinical situations.     eGFR's persistently <90 mL/min signify possible Chronic Kidney     Disease.  CBC     Status: Abnormal   Collection Time    01/03/14  5:12 AM      Result Value Ref Range   WBC 10.4  4.0 - 10.5 K/uL   RBC 4.21 (*) 4.22 - 5.81 MIL/uL   Hemoglobin 12.0 (*) 13.0 - 17.0 g/dL   HCT 36.3 (*) 39.0 - 52.0 %   MCV 86.2  78.0 - 100.0 fL    MCH 28.5  26.0 - 34.0 pg   MCHC 33.1  30.0 - 36.0 g/dL   RDW 12.5  11.5 - 15.5 %   Platelets 193  150 - 400 K/uL  GLUCOSE, CAPILLARY     Status: Abnormal   Collection Time    01/03/14  6:10 AM      Result Value Ref Range   Glucose-Capillary 112 (*) 70 - 99 mg/dL   Comment 1 Notify RN    GLUCOSE, CAPILLARY     Status: Abnormal   Collection Time    01/03/14  8:33 AM      Result Value Ref Range   Glucose-Capillary 120 (*) 70 - 99 mg/dL  BASIC METABOLIC PANEL     Status: Abnormal   Collection Time    01/03/14  9:56 AM      Result Value Ref Range   Sodium 124 (*) 137 - 147 mEq/L   Potassium 4.8  3.7 - 5.3 mEq/L   Chloride 86 (*) 96 - 112 mEq/L   CO2 27  19 - 32 mEq/L   Glucose, Bld 129 (*) 70 - 99 mg/dL   BUN 6  6 - 23 mg/dL   Creatinine, Ser 0.63  0.50 - 1.35 mg/dL   Calcium 8.5  8.4 - 10.5 mg/dL   GFR calc non Af Amer >90  >90 mL/min   GFR calc Af Amer >90  >90  mL/min   Comment: (NOTE)     The eGFR has been calculated using the CKD EPI equation.     This calculation has not been validated in all clinical situations.     eGFR's persistently <90 mL/min signify possible Chronic Kidney     Disease.  GLUCOSE, CAPILLARY     Status: Abnormal   Collection Time    01/03/14 11:31 AM      Result Value Ref Range   Glucose-Capillary 144 (*) 70 - 99 mg/dL  BASIC METABOLIC PANEL     Status: Abnormal   Collection Time    01/03/14  3:56 PM      Result Value Ref Range   Sodium 121 (*) 137 - 147 mEq/L   Comment: CRITICAL RESULT CALLED TO, READ BACK BY AND VERIFIED WITH:     MORRIS C. AT 1643 ON [id number] BY THOMPSON S.   Potassium 4.8  3.7 - 5.3 mEq/L   Chloride 84 (*) 96 - 112 mEq/L   CO2 28  19 - 32 mEq/L   Glucose, Bld 153 (*) 70 - 99 mg/dL   BUN 6  6 - 23 mg/dL   Creatinine, Ser 0.65  0.50 - 1.35 mg/dL   Calcium 8.4  8.4 - 10.5 mg/dL   GFR calc non Af Amer >90  >90 mL/min   GFR calc Af Amer >90  >90 mL/min   Comment: (NOTE)     The eGFR has been calculated using the CKD EPI  equation.     This calculation has not been validated in all clinical situations.     eGFR's persistently <90 mL/min signify possible Chronic Kidney     Disease.  GLUCOSE, CAPILLARY     Status: Abnormal   Collection Time    01/03/14  4:22 PM      Result Value Ref Range   Glucose-Capillary 140 (*) 70 - 99 mg/dL   Comment 1 Notify RN    BASIC METABOLIC PANEL     Status: Abnormal   Collection Time    01/03/14 10:07 PM      Result Value Ref Range   Sodium 120 (*) 137 - 147 mEq/L   Comment: CRITICAL RESULT CALLED TO, READ BACK BY AND VERIFIED WITH:     T.JAMES AT 2254 ON 01/03/14 BY S.VANHOORNE   Potassium 4.2  3.7 - 5.3 mEq/L   Chloride 83 (*) 96 - 112 mEq/L   CO2 27  19 - 32 mEq/L   Glucose, Bld 183 (*) 70 - 99 mg/dL   BUN 6  6 - 23 mg/dL   Creatinine, Ser 0.61  0.50 - 1.35 mg/dL   Calcium 8.4  8.4 - 10.5 mg/dL   GFR calc non Af Amer >90  >90 mL/min   GFR calc Af Amer >90  >90 mL/min   Comment: (NOTE)     The eGFR has been calculated using the CKD EPI equation.     This calculation has not been validated in all clinical situations.     eGFR's persistently <90 mL/min signify possible Chronic Kidney     Disease.  GLUCOSE, CAPILLARY     Status: Abnormal   Collection Time    01/03/14 10:17 PM      Result Value Ref Range   Glucose-Capillary 192 (*) 70 - 99 mg/dL   Comment 1 Notify RN    CLOSTRIDIUM DIFFICILE BY PCR     Status: Abnormal   Collection Time    01/04/14  2:20 AM  Result Value Ref Range   C difficile by pcr POSITIVE (*) NEGATIVE   Comment: CRITICAL RESULT CALLED TO, READ BACK BY AND VERIFIED WITH:      JAMES,T @ 0510 ON 01/04/14 BY WOODIE,J  BASIC METABOLIC PANEL     Status: Abnormal   Collection Time    01/04/14  5:12 AM      Result Value Ref Range   Sodium 122 (*) 137 - 147 mEq/L   Potassium 4.1  3.7 - 5.3 mEq/L   Chloride 84 (*) 96 - 112 mEq/L   CO2 27  19 - 32 mEq/L   Glucose, Bld 150 (*) 70 - 99 mg/dL   BUN 4 (*) 6 - 23 mg/dL   Creatinine, Ser 0.59   0.50 - 1.35 mg/dL   Calcium 8.5  8.4 - 10.5 mg/dL   GFR calc non Af Amer >90  >90 mL/min   GFR calc Af Amer >90  >90 mL/min   Comment: (NOTE)     The eGFR has been calculated using the CKD EPI equation.     This calculation has not been validated in all clinical situations.     eGFR's persistently <90 mL/min signify possible Chronic Kidney     Disease.  GLUCOSE, CAPILLARY     Status: Abnormal   Collection Time    01/04/14  7:43 AM      Result Value Ref Range   Glucose-Capillary 160 (*) 70 - 99 mg/dL   Comment 1 Notify RN      Ct Head Wo Contrast  01/02/2014   CLINICAL DATA:  Delirium. Acute encephalopathy. Diabetes. COPD. CHF. Schizophrenia.  EXAM: CT HEAD WITHOUT CONTRAST  TECHNIQUE: Contiguous axial images were obtained from the base of the skull through the vertex without intravenous contrast.  COMPARISON:  None.  FINDINGS: Sinuses/Soft tissues: Motion degradation, despite 2 attempts. Minimal mucosal thickening of the left ethmoid air cells. Clear mastoid air cells.  Intracranial: Advanced cerebral atrophy for age. Moderate low density in the periventricular white matter likely related to small vessel disease. No mass lesion, hemorrhage, hydrocephalus, acute infarct, intra-axial, or extra-axial fluid collection.  IMPRESSION: 1. Motion degraded exam. 2. Given this factor, no acute intracranial abnormality. 3.  Cerebral atrophy and small vessel ischemic change.   Electronically Signed   By: Abigail Miyamoto M.D.   On: 01/02/2014 16:37   Dg Chest Port 1 View  01/02/2014   CLINICAL DATA:  Shortness of breath.  EXAM: PORTABLE CHEST - 1 VIEW  FINDINGS: Cardiac enlargement. Mild diffuse pulmonary opacities suggesting early pulmonary edema. Worsening aeration from priors. Negative osseous structures.  IMPRESSION: Cardiac enlargement.  Suspected early pulmonary edema.   Electronically Signed   By: Rolla Flatten M.D.   On: 01/02/2014 09:26   Dg Abd Portable 1v  01/02/2014   CLINICAL DATA:  Distended  abdomen.  EXAM: PORTABLE ABDOMEN - 1 VIEW  COMPARISON:  None.  FINDINGS: Mild prominence of small bowel gas without obstruction or free air. No visible definite abnormal calcifications. Osseous structures unremarkable.  IMPRESSION: No visible obstruction or free air on this supine one view abdominal radiograph.   Electronically Signed   By: Rolla Flatten M.D.   On: 01/02/2014 16:40    Review of Systems  Unable to perform ROS: mental acuity  Respiratory: Negative for shortness of breath.   Gastrointestinal: Negative for nausea and vomiting.   Blood pressure 156/96, pulse 74, temperature 97.3 F (36.3 C), temperature source Oral, resp. rate 18, height 6' (1.829 m),  weight 132.3 kg (291 lb 10.7 oz), SpO2 92.00%. Physical Exam  Constitutional: No distress.  Eyes: No scleral icterus.  Cardiovascular: Normal rate.   No murmur heard. Respiratory: No respiratory distress. He has wheezes.  GI: There is no tenderness. There is no rebound and no guarding.  Abdomen is full  Musculoskeletal: He exhibits edema and tenderness.  Neurological:  Patient is somolent but arusable    Assessment/Plan: Problem #1 hyponatremia: This moment patient with history of CHF and leg edema. Hence we may be dealing with hypervolemic hyponatremia. Presently other discharges of hyponatremia cannot ruled out. His sodium seems to be slightly better. Problem #2 increased confusion/delirium: Possibly related to his pain medication dependence. His hyponatremia also may contribute. However with sodium of 122 ,his hyponatremia is less likely to be the sole reason for his altered mental status. Problem #3 history of sleep apnea Problem #4 history of schizophrenia Problem #5 history of diabetes Problem #7 history of CHF Problem #7 morbid obesity Plan: Agree with freewater restriction Will start patient on Lasix 20 mg IV twice a day We'll check his urine sodium, osmolality and also serum osmolality today. We'll check his uric  acid level We'll check his basic metabolic panel in the morning.  Rick Cruz 01/04/2014, 9:07 AM

## 2014-01-04 NOTE — Progress Notes (Signed)
PROGRESS NOTE  Rick Cruz [id number] DOB: 1946-09-08 DOA: 01/02/2014 PCP: [name], MD  Summary: 68 year old man with history of chronic pain syndrome, schizophrenia, diabetes mellitus presented to the emergency department at the insistence of his wife for increasing confusion. Initial evaluation suggested hyponatremia and acute encephalopathy/delirium related to narcotic overuse. Encephalopathy quickly resolved with withholding of narcotics. However hyponatremia persists, may be hypervolemic. Nephrology assisting with management. Also developed c. diff colitis.  Assessment/Plan: 1. Acute toxic encephalopathy with delirium. Clinically resolved despite continued hyponatremia. Infectious workup negative. Leukocytosis  resolved. No fever. Likely related to narcotic overuse. Consider adverse reaction from Geodon. No evidence of NMS or serotonin syndrome.  2. Narcotic abuse, overuse. Uses primarily for shortness of breath. Currently stable. Tylenol level normal. LFTs normal. 3. Hyponatremia, hypochloremia, suspect secondary to potomania, poor solute intake. Resistant to correction with IV fluids. Mentation appears normal. 4. C. difficile colitis. Likely related to antibiotics on admission. Not currently on any antibiotics. 5. Chronic pain syndrome. Continue to hold narcotics. No evidence of withdrawal. Patient denies pain. 6. Diabetes mellitus, suspect uncontrolled. Hemoglobin A1c pending. Currently stable.  7. Schizophrenia. Stable. Can likely restart Geodon 4/21. 8. Obstructive sleep apnea, noncompliant with CPAP. 9. Chronic hypoxic respiratory failure, stable.   Treatment for C. difficile colitis with metronidazole.  Nephrology consultation for assistance with management of hyponatremia appreciated. Plan for Lasix, continue fluid restriction.  Code Status: full code DVT prophylaxis: Lovenox Family Communication: None present Disposition Plan: Home when improved  Murray Hodgkins,  MD  [facility]  Pager [phone] If 7PM-7AM, please contact night-coverage at [email or web address], password [facility] 01/04/2014, 2:51 PM  LOS: 2 days   Consultants:  Nephrology  Procedures:    Antibiotics:  Metronidazole 4/20 >>   HPI/Subjective: Developed diarrhea overnight, started on Flagyl for positive C. difficile PCR. No other complaints except diarrhea. Denies pain.  Objective: Filed Vitals:   01/03/14 2201 01/03/14 2327 01/04/14 0630 01/04/14 1316  BP: 155/91  156/96   Pulse: 80 88 74   Temp: 98.2 F (36.8 C)  97.3 F (36.3 C)   TempSrc: Oral  Oral   Resp: 20 18 18    Height:      Weight:      SpO2: 95% 94% 92% 94%    Intake/Output Summary (Last 24 hours) at 01/04/14 1451 Last data filed at 01/04/14 1028  Gross per 24 hour  Intake   1640 ml  Output   3650 ml  Net  -2010 ml     Filed Weights   01/02/14 0907 01/02/14 1634 01/03/14 0500  Weight: 131.09 kg (289 lb) 131.2 kg (289 lb 3.9 oz) 132.3 kg (291 lb 10.7 oz)    Exam:   Afebrile, vital signs are stable.  Gen. Afebrile, vital signs are stable. Chronic stable hypoxia 3 L.  Psychiatric. Grossly normal the neck. Speech fluent and appropriate. Oriented to self, month, year, location.  Cardiovascular. RRR, no m/r/g. No LE edema  Respiratory. Clear to auscultation bilaterally. No wheezes, rales or rhonchi. Normal respiratory effort.  Abdomen soft, nontender, nondistended.  Skin appears grossly unremarkable.  Data Reviewed:  Excellent urine output, 3400.  Capillary blood sugars stable.  Sodium and chloride somewhat worse, 119, 80.  Scheduled Meds: . [START ON 01/05/2014] enoxaparin (LOVENOX) injection  60 mg Subcutaneous Daily  . furosemide  20 mg Intravenous BID  . insulin aspart  0-9 Units Subcutaneous TID WC  . insulin glargine  30 Units Subcutaneous QPM  . metoprolol  50 mg Oral BID  .  metroNIDAZOLE  250 mg Oral 3 times per day  . pantoprazole  40 mg Oral Daily  . sodium chloride  3 mL  Intravenous Q12H   Continuous Infusions: . sodium chloride 150 mL/hr at 01/04/14 1239    Active Problems:   Delirium   Acute encephalopathy   Time spent 20 minutes

## 2014-01-04 NOTE — Progress Notes (Signed)
CRITICAL VALUE ALERT  Critical value received:  Positive CDiff  Date of notification:  01/04/2014  Time of notification:  0525  Critical value read back: Yes  Nurse who received alert:  T.James  MD notified (1st page):  Dr. Shanon Brow  Time of first page:  0525  MD notified (2nd page):  Time of second page:  Responding MD:  Dr. Shanon Brow  Time MD responded:  0530. Ordered Po flagyl

## 2014-01-04 NOTE — Evaluation (Signed)
Physical Therapy Evaluation Patient Details Name: Rick Cruz MRN: [id number] DOB: 12-Sep-1946 Today's Date: 01/04/2014   History of Present Illness  Pt is admitted with encephalopathy, probably due to the overuse of narcotics used to control his chronic pain syndrome (neck and back pain).  He also has a dx of schizophrenia, DM and COPD on 3 L O2 at home.  He is morbidly obese and extremely sedentary at baseline.  Son states "he stays in his lift chair day and night".  Clinical Impression   Pt was seen for evaluation.  He was awake and cooperative but lethargic in appearance and response.  He is married and minimally active at home due to his chronic pain and dyspnea.  His only activity is that of walking about 30' to the bathroom throughout the day, which is does without assistance.  Currently, he was unable (or unwilling?) to try to walk, even with a walker.  I recommended obtaining a BSC for pt, but family does not wish to pursue this.  Therefore, they were advised to bring a urinal home with them from the hospital.  We will recommend HHPT at d/c.  My hope is that when pt is in familiar surroundings, he should speedily regain his prior level of function.    Follow Up Recommendations Home health PT    Equipment Recommendations  None recommended by PT    Recommendations for Other Services   none    Precautions / Restrictions Precautions Precautions: Fall Restrictions Weight Bearing Restrictions: No      Mobility  Bed Mobility Overal bed mobility: Modified Independent                Transfers Overall transfer level: Needs assistance Equipment used: Rolling walker (2 wheeled) Transfers: Sit to/from [facility] Sit to Stand: Min assist Stand pivot transfers: Min guard       General transfer comment: pt sleeps in a lift chair and is not used to getting up out of a bed...needed assist with his trunk to go from sidelying to  sit  Ambulation/Gait Ambulation/Gait assistance:  (unable/unwilling to walk)              Stairs:  N/T                     Balance Overall balance assessment: Needs assistance Sitting-balance support: No upper extremity supported;Feet supported Sitting balance-Leahy Scale: Good     Standing balance support: Bilateral upper extremity supported Standing balance-Leahy Scale: Good                                 Home Living Family/patient expects to be discharged to:: Private residence Living Arrangements: Spouse/significant other Available Help at Discharge: Family;Available 24 hours/day Type of Home: House Home Access: Stairs to enter Entrance Stairs-Rails: Right Entrance Stairs-Number of Steps: 4 Home Layout: One level Home Equipment: Cane - single point;Shower seat      Prior Function Level of Independence: Needs assistance   Gait / Transfers Assistance Needed: ambulates short distances with a cane, sits in a lift chair  ADL's / Homemaking Assistance Needed: wife assists with a sponge bath                Extremity/Trunk Assessment               Lower Extremity Assessment: Generalized weakness      Cervical / Trunk Assessment: Kyphotic  Communication   Communication: No difficulties  Cognition Arousal/Alertness: Lethargic Behavior During Therapy: WFL for tasks assessed/performed Overall Cognitive Status: Within Functional Limits for tasks assessed                                    Assessment/Plan    PT Assessment Patient needs continued PT services  PT Diagnosis Difficulty walking;Generalized weakness   PT Problem List Decreased strength;Decreased activity tolerance;Decreased mobility;Cardiopulmonary status limiting activity;Obesity;Pain  PT Treatment Interventions Gait training;Stair training;Therapeutic exercise   PT Goals (Current goals can be found in the Care Plan section) Acute Rehab PT  Goals Patient Stated Goal: none stated PT Goal Formulation: With patient/family Time For Goal Achievement: 01/18/14 Potential to Achieve Goals: Good    Frequency Min 3X/week   Barriers to discharge  multiple steps to enter the home                     End of Session Equipment Utilized During Treatment: Gait belt Activity Tolerance: Patient tolerated treatment well Patient left: in chair;with call bell/phone within reach;with chair alarm set           Time: [id number] PT Time Calculation (min): 51 min   Charges:   PT Evaluation $Initial PT Evaluation Tier I: 1 Procedure     PT G Codes:          Sable Feil 01/04/2014, 3:06 PM

## 2014-01-05 DIAGNOSIS — R404 Transient alteration of awareness: Secondary | ICD-10-CM

## 2014-01-05 LAB — GLUCOSE, CAPILLARY
Glucose-Capillary: 122 mg/dL — ABNORMAL HIGH (ref 70–99)
Glucose-Capillary: 188 mg/dL — ABNORMAL HIGH (ref 70–99)
Glucose-Capillary: 138 mg/dL — ABNORMAL HIGH (ref 70–99)
Glucose-Capillary: 208 mg/dL — ABNORMAL HIGH (ref 70–99)

## 2014-01-05 LAB — BASIC METABOLIC PANEL
BUN: 4 mg/dL — ABNORMAL LOW (ref 6–23)
BUN: 4 mg/dL — ABNORMAL LOW (ref 6–23)
BUN: 4 mg/dL — ABNORMAL LOW (ref 6–23)
BUN: 5 mg/dL — AB (ref 6–23)
CO2: 29 mEq/L (ref 19–32)
CO2: 31 mEq/L (ref 19–32)
CO2: 28 mEq/L (ref 19–32)
CO2: 30 mEq/L (ref 19–32)
Calcium: 8.5 mg/dL (ref 8.4–10.5)
Calcium: 8.7 mg/dL (ref 8.4–10.5)
Calcium: 8.6 mg/dL (ref 8.4–10.5)
Calcium: 8.6 mg/dL (ref 8.4–10.5)
Chloride: 77 mEq/L — ABNORMAL LOW (ref 96–112)
Chloride: 80 mEq/L — ABNORMAL LOW (ref 96–112)
Chloride: 76 mEq/L — ABNORMAL LOW (ref 96–112)
Chloride: 77 mEq/L — ABNORMAL LOW (ref 96–112)
Creatinine, Ser: 0.57 mg/dL (ref 0.50–1.35)
Creatinine, Ser: 0.58 mg/dL (ref 0.50–1.35)
Creatinine, Ser: 0.59 mg/dL (ref 0.50–1.35)
Creatinine, Ser: 0.62 mg/dL (ref 0.50–1.35)
GFR calc Af Amer: >90 mL/min (ref >90)
GFR calc Af Amer: >90 mL/min (ref >90)
GFR calc Af Amer: >90 mL/min (ref >90)
GFR calc non Af Amer: >90 mL/min (ref >90)
GFR calc non Af Amer: >90 mL/min (ref >90)
GFR calc non Af Amer: >90 mL/min (ref >90)
GFR calc non Af Amer: >90 mL/min (ref >90)
GLUCOSE: 102 mg/dL — AB (ref 70–99)
GLUCOSE: 157 mg/dL — AB (ref 70–99)
Glucose, Bld: 108 mg/dL — ABNORMAL HIGH (ref 70–99)
Glucose, Bld: 196 mg/dL — ABNORMAL HIGH (ref 70–99)
POTASSIUM: 4 meq/L (ref 3.7–5.3)
Potassium: 3.9 mEq/L (ref 3.7–5.3)
Potassium: 4.2 mEq/L (ref 3.7–5.3)
Potassium: 3.8 mEq/L (ref 3.7–5.3)
SODIUM: 116 meq/L — AB (ref 137–147)
Sodium: 120 mEq/L — CL (ref 137–147)
Sodium: 116 mEq/L — CL (ref 137–147)
Sodium: 118 mEq/L — CL (ref 137–147)

## 2014-01-05 LAB — OSMOLALITY: Osmolality: 246 mOsm/kg — ABNORMAL LOW (ref 275–300)

## 2014-01-05 MED ORDER — DEMECLOCYCLINE HCL 150 MG PO TABS
300.0000 mg | ORAL_TABLET | Freq: Two times a day (BID) | ORAL | Status: DC
  Administered 2014-01-05 – 2014-01-08 (×8): 300 mg via ORAL
  Filled 2014-01-05 (×13): qty 2

## 2014-01-05 MED ORDER — SODIUM CHLORIDE 1 G PO TABS
1.0000 g | ORAL_TABLET | Freq: Two times a day (BID) | ORAL | Status: DC
  Filled 2014-01-05 (×2): qty 1

## 2014-01-05 MED ORDER — SODIUM CHLORIDE 1 G PO TABS
2.0000 g | ORAL_TABLET | Freq: Three times a day (TID) | ORAL | Status: DC
  Administered 2014-01-05 – 2014-01-10 (×15): 2 g via ORAL
  Filled 2014-01-05 (×23): qty 2

## 2014-01-05 NOTE — Plan of Care (Signed)
Problem: Phase I Progression Outcomes Goal: OOB as tolerated unless otherwise ordered Outcome: Completed/Met Date Met:  01/05/14 Pt OOB today with nursing staff and PT, Up to Chair with Max assist

## 2014-01-05 NOTE — Progress Notes (Signed)
PROGRESS NOTE  ANGELINO SAUDER [id number] DOB: 1946/01/02 DOA: 01/02/2014 PCP: [name], MD  Summary: 68 year old man with history of chronic pain syndrome, schizophrenia, diabetes mellitus presented to the emergency department at the insistence of his wife for increasing confusion. Initial evaluation suggested hyponatremia and acute encephalopathy/delirium related to narcotic overuse. Encephalopathy quickly resolved with withholding of narcotics. However hyponatremia persists, may be hypervolemic. Nephrology assisting with management. Also developed c. diff colitis.  Assessment/Plan: 1. Acute toxic encephalopathy with delirium. Clinically resolved despite continued hyponatremia. Infectious workup negative. Leukocytosis  resolved. No fever. Likely related to narcotic overuse. Consider adverse reaction from Geodon. No evidence of NMS or serotonin syndrome.  2. Narcotic abuse, overuse. Uses primarily for shortness of breath. Currently stable. Tylenol level normal. LFTs normal. 3. Hyponatremia, hypochloremia, appreciate nephrology assistance. 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 He may have an element of polydipsia. It appears the patient is consuming excess free water, despite being on free water restriction. His sodium has trended down today despite treatments. He was started on the demeclocycline, if this does not improve serum sodium, may need to consider tolvaptan. Mentation appears normal. 4. C. difficile colitis. Likely related to antibiotics on admission. Patient was started on metronidazole. Continue to monitor for clinical improvement. 5. Chronic pain syndrome. Continue to hold narcotics. No evidence of withdrawal. He does not appear to be having any significant pain at this time. 6. Diabetes mellitus, suspect uncontrolled. Hemoglobin A1c pending. Currently stable.  7. Schizophrenia. Stable. Can likely restart Geodon 4/21. 8. Obstructive sleep apnea, noncompliant with CPAP. 9. Chronic  hypoxic respiratory failure, stable.   .  Code Status: full code DVT prophylaxis: Lovenox Family Communication: None present Disposition Plan: Home when improved  Kathie Dike, MD  [facility]  Pager [phone] If 7PM-7AM, please contact night-coverage at [email or web address], password [facility] 01/05/2014, 7:27 PM  LOS: 3 days   Consultants:  Nephrology  Procedures:    Antibiotics:  Metronidazole 4/20 >>   HPI/Subjective: Reports that he does not feel well. Does not give any further details of why he does not feel well. Denies any shortness of breath. Reports chronic back pain. Says he was admitted to the hospital because "he took too many pain pills".  Objective: Filed Vitals:   01/04/14 2110 01/04/14 2333 01/05/14 0633 01/05/14 1337  BP: 137/90  152/86 133/83  Pulse: 69  72 69  Temp: 98.3 F (36.8 C)  98.2 F (36.8 C) 98.5 F (36.9 C)  TempSrc: Oral  Oral Oral  Resp: 20  20 20   Height:      Weight:      SpO2: 98% 98% 100% 95%    Intake/Output Summary (Last 24 hours) at 01/05/14 1927 Last data filed at 01/05/14 1300  Gross per 24 hour  Intake   1320 ml  Output    600 ml  Net    720 ml     Filed Weights   01/02/14 0907 01/02/14 1634 01/03/14 0500  Weight: 131.09 kg (289 lb) 131.2 kg (289 lb 3.9 oz) 132.3 kg (291 lb 10.7 oz)    Exam:   Afebrile, vital signs are stable.  Gen. Afebrile, vital signs are stable. Chronic stable hypoxia 3 L.  Psychiatric. Grossly normal the neck. Speech fluent and appropriate. Oriented to self, month, year, location.  Cardiovascular. RRR, no m/r/g. No LE edema  Respiratory. Clear to auscultation bilaterally. No wheezes, rales or rhonchi. Normal respiratory effort.  Abdomen soft, nontender, nondistended.  Skin appears grossly unremarkable.  Scheduled Meds: . demeclocycline  300 mg Oral Q12H  . enoxaparin (LOVENOX) injection  60 mg Subcutaneous Daily  . furosemide  20 mg Intravenous BID  . insulin aspart  0-9 Units  Subcutaneous TID WC  . insulin glargine  30 Units Subcutaneous QPM  . metoprolol  50 mg Oral BID  . metroNIDAZOLE  500 mg Oral 3 times per day  . pantoprazole  40 mg Oral Daily  . sodium chloride  3 mL Intravenous Q12H  . sodium chloride  2 g Oral TID WC   Continuous Infusions:    Active Problems:   Delirium   Acute encephalopathy   C. difficile colitis   Time spent 30 minutes

## 2014-01-05 NOTE — Progress Notes (Addendum)
CRITICAL VALUE ALERT  Critical value received:  Na 116  Date of notification:  01/05/14  Time of notification:  1330  Critical value read back:yes  Nurse who received alert:  Sharyn Blitz, RN  MD notified (1st page):  Dr. Roderic Palau  Time of first page:  1422  MD notified (2nd page):  Time of second page:  Responding MD:  Dr. Roderic Palau  Time MD responded:  [phone]  Spoke with Dr. Roderic Palau. Dr. Roderic Palau ask that we page Dr. Lowanda Foster since he is managing hyponatremia. Dr. Lowanda Foster paged and made aware. New orders received for NaCl tablets TID. Order placed in chart. Will continue to monitor.

## 2014-01-05 NOTE — Progress Notes (Signed)
Pt refuse to wear BIPAP machine tonight nurse informed. Question pt and he stated he just can't wear it and not going to.

## 2014-01-05 NOTE — Care Management Note (Addendum)
    Page 1 of 2   01/08/2014     11:02:38 AM CARE MANAGEMENT NOTE 01/08/2014  Patient:  KAMARION, CICALE   Account Number:  [id number]  Date Initiated:  01/05/2014  Documentation initiated by:  Theophilus Kinds  Subjective/Objective Assessment:   Pt admitted from home with hyponatremia. Pt lives with his wife and will return home at discharge. Pt has a cane for home use for prn use and home O2 with [employer].     Action/Plan:   PT recommends HH PT  and pt choose [facility] for HH. Will continue to follow for discharge planning needs.   Anticipated DC Date:  01/07/2014   Anticipated DC Plan:  [place]  CM consult      [facility] Choice  HOME HEALTH   Choice offered to / List presented to:  C-1 Patient        [place] arranged  [place]      [place].   Status of service:  Completed, signed off Medicare Important Message given?  YES (If response is "NO", the following Medicare IM given date fields will be blank) Date Medicare IM given:  01/08/2014 Date Additional Medicare IM given:    Discharge Disposition:  [place]  Per UR Regulation:    If discussed at Long Length of Stay Meetings, dates discussed:   01/07/2014    Comments:  01/08/14 [place], RN BSN CM Pt potential discharge 01/09/14 with [facility] RN and PT. Romualdo Bolk of [facility] is aware and will collect the pts information fromt the chart. [place] services to start within 48 hours of discharge. No DME needs noted. Weekend staff to call [facility] once discharged. Pt and pts nurse aware of discharge arrangements.  01/05/14 [place], RN BSN CM

## 2014-01-05 NOTE — Progress Notes (Signed)
Physical Therapy Treatment Patient Details Name: Rick Cruz MRN: [id number] DOB: [birth year] Today's Date: 2014/01/08    History of Present Illness Pt is up in the chair, reports that he doesn't feel that well.  He is unable to be specific.    PT Comments     An attempt was made to work with pt for strengthening/gait training.  We initiated therapeutic exercise with pt in the chair but he only worked on quadriceps strengthening before he refused to do anything else.  He would not give me any reason but that he didn't feel well.  Pt had moderate difficulty following directions and appeared to be very very sluggish in his responses.  Follow Up Recommendations   HHPT     Equipment Recommendations   none    Recommendations for Other Services  none     Precautions / Restrictions Precautions Precautions: Fall Restrictions Weight Bearing Restrictions: No    Mobility  Bed Mobility:  N/T                  Transfers:  N/T                    Ambulation/Gait:  N/T                 StairsN/T                                                          Cognition Arousal/Alertness: Lethargic Behavior During Therapy: WFL for tasks assessed/performed Overall Cognitive Status: Within Functional Limits for tasks assessed                      Exercises General Exercises - Lower Extremity Long Arc Quad: AROM;Both;10 reps;Seated                 Home Living                                  PT Goals (current goals can now be found in the care plan section) Progress towards PT goals: Not progressing toward goals - comment (minimally cooperative with PT)    Frequency       PT Plan Current plan remains appropriate                 End of Session Equipment Utilized During Treatment: Oxygen Activity Tolerance: Patient limited by fatigue Patient left: in chair;with call bell/phone within reach;with  chair alarm set     Time: [id number] PT Time Calculation (min): 17 min  Charges:  $Therapeutic Exercise: 8-22 mins                    G Codes:      Sable Feil Jan 08, 2014, 11:37 AM

## 2014-01-05 NOTE — Progress Notes (Signed)
Subjective: Interval History: has complaints Feeling tired and get out of bed..  Objective: Vital signs in last 24 hours: Temp:  [98.2 F (36.8 C)-98.4 F (36.9 C)] 98.2 F (36.8 C) (04/21 [id number]) Pulse Rate:  [69-80] 72 (04/21 0633) Resp:  [20] 20 (04/21 0633) BP: (137-177)/(86-107) 152/86 mmHg (04/21 0633) SpO2:  [94 %-100 %] 100 % (04/21 [id number]) Weight change:   Intake/Output from previous day: 04/20 0701 - 04/21 0700 In: 1552.5 [P.O.:1200; I.V.:352.5] Out: 2000 [Urine:2000] Intake/Output this shift:    Generally patient is awake and alert. He seems to be restless. Patient won't to go out of his bed. Patient is also disoriented to time and place. Chest: Decreased breath sound has some expiratory wheezing Heart exam revealed regular rate and rhythm Abdomen: Obese, nontender and positive bowel sound Extremities: He has 1-2+ edema.   History b Results:  Recent Labs  01/02/14 0925 01/03/14 0512  WBC 13.9* 10.4  HGB 12.9* 12.0*  HCT 38.9* 36.3*  PLT 223 193   BMET:  Recent Labs  01/04/14 2346 01/05/14 0519  NA 116* 120*  K 4.0 3.9  CL 77* 80*  CO2 29 31  GLUCOSE 102* 108*  BUN 4* 4*  CREATININE 0.57 0.58  CALCIUM 8.5 8.7   No results found for this basename: PTH,  in the last 72 hours Iron Studies: No results found for this basename: IRON, TIBC, TRANSFERRIN, FERRITIN,  in the last 72 hours  Studies/Results: No results found.  I have reviewed the patient's current medications.  Assessment/Plan:  problem #1 hyponatremia: Possibly hypovolemic hypernatremia. Presently patient with edema, high urine sodium and osmolality. At this moment SIADH cannot ruled out except for patient having a day man. His BUN is low and also his uric acid level. His sodium is 120. Patient is on freewater restriction. Problem #2 C. difficile colitis Problem #3 sleep apnea Problem #4 diabetes Problem #5 paranoid schizophrenia Problem #6 coronary artery disease Problem #7 COPD Problem  #8 altered mental status: Patient  is confused but seems to be more alert today.  Plan: We'll continue with Lasix and freewater restriction. We'll use demeclocycline 300 mg 2 twice a day If he doesn't respond to that we'll use to TolvaptanIV. We'll follow his basic metabolic panel   LOS: 3 days   Dorean Daniello S Kunio Cummiskey 01/05/2014,7:38 AM

## 2014-01-06 LAB — BASIC METABOLIC PANEL
BUN: 5 mg/dL — AB (ref 6–23)
BUN: 5 mg/dL — ABNORMAL LOW (ref 6–23)
BUN: 5 mg/dL — AB (ref 6–23)
BUN: 4 mg/dL — ABNORMAL LOW (ref 6–23)
CALCIUM: 8.6 mg/dL (ref 8.4–10.5)
CALCIUM: 8.7 mg/dL (ref 8.4–10.5)
CHLORIDE: 79 meq/L — AB (ref 96–112)
CHLORIDE: 79 meq/L — AB (ref 96–112)
CO2: 32 meq/L (ref 19–32)
CO2: 32 meq/L (ref 19–32)
CO2: 32 meq/L (ref 19–32)
CO2: 32 meq/L (ref 19–32)
CREATININE: 0.63 mg/dL (ref 0.50–1.35)
Calcium: 8.7 mg/dL (ref 8.4–10.5)
Calcium: 8.5 mg/dL (ref 8.4–10.5)
Chloride: 78 mEq/L — ABNORMAL LOW (ref 96–112)
Chloride: 82 mEq/L — ABNORMAL LOW (ref 96–112)
Creatinine, Ser: 0.63 mg/dL (ref 0.50–1.35)
Creatinine, Ser: 0.65 mg/dL (ref 0.50–1.35)
Creatinine, Ser: 0.66 mg/dL (ref 0.50–1.35)
GFR calc Af Amer: >90 mL/min (ref >90)
GFR calc Af Amer: >90 mL/min (ref >90)
GFR calc Af Amer: >90 mL/min (ref >90)
GFR calc non Af Amer: >90 mL/min (ref >90)
GFR calc non Af Amer: >90 mL/min (ref >90)
GFR calc non Af Amer: >90 mL/min (ref >90)
GFR calc non Af Amer: >90 mL/min (ref >90)
GLUCOSE: 138 mg/dL — AB (ref 70–99)
GLUCOSE: 192 mg/dL — AB (ref 70–99)
Glucose, Bld: 116 mg/dL — ABNORMAL HIGH (ref 70–99)
Glucose, Bld: 262 mg/dL — ABNORMAL HIGH (ref 70–99)
POTASSIUM: 3.7 meq/L (ref 3.7–5.3)
POTASSIUM: 3.6 meq/L — AB (ref 3.7–5.3)
Potassium: 3.8 mEq/L (ref 3.7–5.3)
Potassium: 3.9 mEq/L (ref 3.7–5.3)
SODIUM: 120 meq/L — AB (ref 137–147)
Sodium: 120 mEq/L — CL (ref 137–147)
Sodium: 122 mEq/L — ABNORMAL LOW (ref 137–147)
Sodium: 123 mEq/L — ABNORMAL LOW (ref 137–147)

## 2014-01-06 LAB — GLUCOSE, CAPILLARY
GLUCOSE-CAPILLARY: 163 mg/dL — AB (ref 70–99)
GLUCOSE-CAPILLARY: 247 mg/dL — AB (ref 70–99)
Glucose-Capillary: 132 mg/dL — ABNORMAL HIGH (ref 70–99)
Glucose-Capillary: 190 mg/dL — ABNORMAL HIGH (ref 70–99)

## 2014-01-06 LAB — CBC
HEMATOCRIT: 35.5 % — AB (ref 39.0–52.0)
Hemoglobin: 12.2 g/dL — ABNORMAL LOW (ref 13.0–17.0)
MCH: 28.7 pg (ref 26.0–34.0)
MCHC: 34.4 g/dL (ref 30.0–36.0)
MCV: 83.5 fL (ref 78.0–100.0)
Platelets: 197 10*3/uL (ref 150–400)
RBC: 4.25 MIL/uL (ref 4.22–5.81)
RDW: 12.4 % (ref 11.5–15.5)
WBC: 11.8 10*3/uL — AB (ref 4.0–10.5)

## 2014-01-06 NOTE — Progress Notes (Signed)
PROGRESS NOTE  RANDALPH DUFRENE [id number] DOB: [birth year] DOA: 01/02/2014 PCP: [name], MD  Summary: 68 year old man with history of chronic pain syndrome, schizophrenia, diabetes mellitus presented to the emergency department at the insistence of his wife for increasing confusion. Initial evaluation suggested hyponatremia and acute encephalopathy/delirium related to narcotic overuse. Encephalopathy quickly resolved with withholding of narcotics. However hyponatremia persists, may be hypervolemic. Nephrology assisting with management. Also developed c. diff colitis.  Assessment/Plan: 1. Acute toxic encephalopathy with delirium. Clinically resolved despite continued hyponatremia. Infectious workup negative. Leukocytosis  resolved. No fever. Likely related to narcotic overuse. Consider adverse reaction from Geodon. No evidence of NMS or serotonin syndrome.  2. Narcotic abuse, overuse. Uses primarily for shortness of breath. Currently stable. Tylenol level normal. LFTs normal. 3. Hyponatremia, hypochloremia, appreciate nephrology assistance. Patient appears to be hypervolemic, but other possibilities include SIADH. He may have an element of polydipsia. It appears the patient is consuming excess free water, despite being on free water restriction. His sodium has trended down today despite treatments. He was started on the demeclocycline, if this does not improve serum sodium, may need to consider tolvaptan. Mentation appears normal. Appreciate nephrology assistance. ?if higher dose of lasix would be reasonable 4. C. difficile colitis. Likely related to antibiotics on admission. Patient was started on metronidazole. Reports that diarrhea is improving. 5. Chronic pain syndrome. Continue to hold narcotics. No evidence of withdrawal. He does not appear to be having any significant pain at this time. 6. Diabetes mellitus, suspect uncontrolled. Hemoglobin A1c pending. Currently stable.  7. Schizophrenia.  Stable. Can likely restart Geodon 4/21. 8. Obstructive sleep apnea, noncompliant with CPAP. 9. Chronic hypoxic respiratory failure, stable.   .  Code Status: full code DVT prophylaxis: Lovenox Family Communication: None present Disposition Plan: Home when improved  Kathie Dike, MD  [facility]  Pager [phone] If 7PM-7AM, please contact night-coverage at [email or web address], password [facility] 01/06/2014, 7:20 PM  LOS: 4 days   Consultants:  Nephrology  Procedures:    Antibiotics:  Metronidazole 4/20 >>   HPI/Subjective: Patient is awake and alert.  Denies any new complaints today.  Objective: Filed Vitals:   01/05/14 2337 01/06/14 0501 01/06/14 1113 01/06/14 1300  BP:  128/83 148/93 141/83  Pulse:  73 85 74  Temp:  97.6 F (36.4 C)  97.1 F (36.2 C)  TempSrc:  Oral  Oral  Resp:  20  20  Height:      Weight:  130.7 kg (288 lb 2.3 oz)    SpO2: 98% 97%  97%    Intake/Output Summary (Last 24 hours) at 01/06/14 1920 Last data filed at 01/06/14 1837  Gross per 24 hour  Intake   3592 ml  Output   2300 ml  Net   1292 ml     Filed Weights   01/02/14 1634 01/03/14 0500 01/06/14 0501  Weight: 131.2 kg (289 lb 3.9 oz) 132.3 kg (291 lb 10.7 oz) 130.7 kg (288 lb 2.3 oz)    Exam:   Afebrile, vital signs are stable.  Gen. Afebrile, vital signs are stable. Chronic stable hypoxia 3 L.  Psychiatric. Grossly normal the neck. Speech fluent and appropriate. Oriented to self, month, year, location.  Cardiovascular. RRR, no m/r/g. 1+ LE edema  Respiratory. Crackles at bases. Normal respiratory effort.  Abdomen soft, nontender, nondistended.  Skin appears grossly unremarkable.  Scheduled Meds: . demeclocycline  300 mg Oral Q12H  . enoxaparin (LOVENOX) injection  60 mg Subcutaneous Daily  . furosemide  20 mg  Intravenous BID  . insulin aspart  0-9 Units Subcutaneous TID WC  . insulin glargine  30 Units Subcutaneous QPM  . metoprolol  50 mg Oral BID  .  metroNIDAZOLE  500 mg Oral 3 times per day  . pantoprazole  40 mg Oral Daily  . sodium chloride  3 mL Intravenous Q12H  . sodium chloride  2 g Oral TID WC   Continuous Infusions:    Active Problems:   Delirium   Acute encephalopathy   C. difficile colitis   Time spent 30 minutes

## 2014-01-06 NOTE — Progress Notes (Signed)
Rick Cruz  MRN: [id number]  DOB/AGE: 03/25/1946 68 y.o.  Primary Care Physician:[name], [name], MD  Admit date: 01/02/2014  Chief Complaint:  Chief Complaint  Patient presents with  . Shortness of Breath  . Altered Mental Status    S-Pt presented on  01/02/2014 with  Chief Complaint  Patient presents with  . Shortness of Breath  . Altered Mental Status  .    Pt today feels better  Meds . demeclocycline  300 mg Oral Q12H  . enoxaparin (LOVENOX) injection  60 mg Subcutaneous Daily  . furosemide  20 mg Intravenous BID  . insulin aspart  0-9 Units Subcutaneous TID WC  . insulin glargine  30 Units Subcutaneous QPM  . metoprolol  50 mg Oral BID  . metroNIDAZOLE  500 mg Oral 3 times per day  . pantoprazole  40 mg Oral Daily  . sodium chloride  3 mL Intravenous Q12H  . sodium chloride  2 g Oral TID WC     Physical Exam: Vital signs in last 24 hours: Temp:  [97.1 F (36.2 C)-98.1 F (36.7 C)] 97.1 F (36.2 C) (04/22 1300) Pulse Rate:  [73-92] 74 (04/22 1300) Resp:  [20] 20 (04/22 1300) BP: (128-148)/(79-93) 141/83 mmHg (04/22 1300) SpO2:  [97 %-100 %] 97 % (04/22 1300) Weight:  [288 lb 2.3 oz (130.7 kg)] 288 lb 2.3 oz (130.7 kg) (04/22 0501) Weight change:  Last BM Date: 01/06/14  Intake/Output from previous day: 04/21 0701 - 04/22 0700 In: 2712 [P.O.:1080; I.V.:1632] Out: 950 [Urine:950] Total I/O In: 1000 [P.O.:1000] Out: 900 [Urine:900]   Physical Exam: General- pt is awake,alert, follows commands Resp- No acute REsp distress, CTA B/L NO Rhonchi CVS- S1S2 regular in rate and rhythm GIT- BS+, soft, NT, ND EXT- NO LE Edema, Cyanosis   Lab Results: CBC  Recent Labs  01/06/14 0505  WBC 11.8*  HGB 12.2*  HCT 35.5*  PLT 197    BMET  Recent Labs  01/06/14 0505 01/06/14 1215  NA 123* 120*  K 3.7 3.8  CL 82* 79*  CO2 32 32  GLUCOSE 116* 262*  BUN 5* 5*  CREATININE 0.63 0.65  CALCIUM 8.7 8.5    Sodium Trend 2015  119--120 2014    125--132 2010   128  MICRO Recent Results (from the past 240 hour(s))  CULTURE, BLOOD (ROUTINE X 2)     Status: None   Collection Time    01/02/14  9:25 AM      Result Value Ref Range Status   Specimen Description LEFT ANTECUBITAL   Final   Special Requests BOTTLES DRAWN AEROBIC AND ANAEROBIC 8CC EACH   Final   Culture NO GROWTH 4 DAYS   Final   Report Status PENDING   Incomplete  CULTURE, BLOOD (ROUTINE X 2)     Status: None   Collection Time    01/02/14  9:25 AM      Result Value Ref Range Status   Specimen Description BLOOD RIGHT ANTECUBITAL   Final   Special Requests BOTTLES DRAWN AEROBIC AND ANAEROBIC 6CC EACH   Final   Culture NO GROWTH 4 DAYS   Final   Report Status PENDING   Incomplete  URINE CULTURE     Status: None   Collection Time    01/02/14 10:00 AM      Result Value Ref Range Status   Specimen Description URINE, CATHETERIZED   Final   Special Requests NONE   Final   Culture  Setup  Time     Final   Value: 01/02/2014 22:56     Performed at [place]     Final   Value: NO GROWTH     Performed at [facility]   Culture     Final   Value: NO GROWTH     Performed at [facility]   Report Status 01/03/2014 FINAL   Final  CLOSTRIDIUM DIFFICILE BY PCR     Status: Abnormal   Collection Time    01/04/14  2:20 AM      Result Value Ref Range Status   C difficile by pcr POSITIVE (*) NEGATIVE Final   Comment: CRITICAL RESULT CALLED TO, READ BACK BY AND VERIFIED WITH:      JAMES,T @ 0510 ON 01/04/14 BY WOODIE,J      Lab Results  Component Value Date   CALCIUM 8.5 01/06/2014               Impression: 1)Hyponatremia        Hypervolemic  Hyponatremia ( hx of CHF + LE edema )        SIADH ?         Chronic Hyponatremia         Low Solute ( as low BUN  , Low uric acid)         Hyperglycemia              Currrently on        Salt + Lasix        Demeclocycline            Fluid restriction but still in net  positive balance since past few days       01/05/14-pt positive by 1.7 liters          2)HTN Medication-  On Diuretics- On Beta blockers    3)Anemia HGb at goal (9--11)  4)ID- C Diff colitis On Metronidazole  5)Psych- hx of Schizophrenia Primary MD following  6)CAD Stable  7)Acid base Co2 at goal     Plan:   Will suggest stricter control of  Water. Will continue current care.      [name] S [name] 01/06/2014, 4:17 PM

## 2014-01-06 NOTE — Progress Notes (Signed)
UR chart review completed.  

## 2014-01-06 NOTE — Progress Notes (Addendum)
CRITICAL VALUE ALERT  Critical value received:  NA 120  Date of notification:  01/06/14  Time of notification:  1258  Critical value read back:yes  Nurse who received alert:  Penni Homans, RN   MD notified (1st page):  Dr. Roderic Palau  Time of first page:  1258  MD notified (2nd page):  Time of second page:  Responding MD:  Dr. Roderic Palau  Time MD responded:  1300

## 2014-01-06 NOTE — Progress Notes (Signed)
PT Cancellation Note  Patient Details Name: Rick Cruz MRN: [id number] DOB: 24-May-1946   Cancelled Treatment:    Reason Eval/Treat Not Completed: Patient declined, no reason specified. Pt refused PT as this time. Pt was educated on the importance of completing bed exercises to maintain strength.   Ahmed Prima, SPTA 01/06/2014, 11:50 AM

## 2014-01-06 NOTE — Progress Notes (Signed)
Patient refused to wear BIPAP tonight. Assured him that he if he changed his mind i would be here all night.

## 2014-01-07 LAB — BASIC METABOLIC PANEL
BUN: 5 mg/dL — ABNORMAL LOW (ref 6–23)
BUN: 4 mg/dL — ABNORMAL LOW (ref 6–23)
BUN: 5 mg/dL — AB (ref 6–23)
BUN: 5 mg/dL — ABNORMAL LOW (ref 6–23)
CALCIUM: 8.5 mg/dL (ref 8.4–10.5)
CALCIUM: 8.7 mg/dL (ref 8.4–10.5)
CALCIUM: 8.8 mg/dL (ref 8.4–10.5)
CALCIUM: 8.8 mg/dL (ref 8.4–10.5)
CHLORIDE: 81 meq/L — AB (ref 96–112)
CO2: 32 mEq/L (ref 19–32)
CO2: 32 mEq/L (ref 19–32)
CO2: 34 mEq/L — ABNORMAL HIGH (ref 19–32)
CO2: 34 mEq/L — ABNORMAL HIGH (ref 19–32)
CREATININE: 0.64 mg/dL (ref 0.50–1.35)
CREATININE: 0.64 mg/dL (ref 0.50–1.35)
CREATININE: 0.66 mg/dL (ref 0.50–1.35)
CREATININE: 0.74 mg/dL (ref 0.50–1.35)
Chloride: 80 mEq/L — ABNORMAL LOW (ref 96–112)
Chloride: 82 mEq/L — ABNORMAL LOW (ref 96–112)
Chloride: 83 mEq/L — ABNORMAL LOW (ref 96–112)
GFR calc Af Amer: >90 mL/min (ref >90)
GFR calc Af Amer: >90 mL/min (ref >90)
GFR calc non Af Amer: >90 mL/min (ref >90)
GFR calc non Af Amer: >90 mL/min (ref >90)
GFR calc non Af Amer: >90 mL/min (ref >90)
GFR calc non Af Amer: >90 mL/min (ref >90)
Glucose, Bld: 137 mg/dL — ABNORMAL HIGH (ref 70–99)
Glucose, Bld: 148 mg/dL — ABNORMAL HIGH (ref 70–99)
Glucose, Bld: 163 mg/dL — ABNORMAL HIGH (ref 70–99)
Glucose, Bld: 202 mg/dL — ABNORMAL HIGH (ref 70–99)
Potassium: 3.7 mEq/L (ref 3.7–5.3)
Potassium: 3.8 mEq/L (ref 3.7–5.3)
Potassium: 3.9 mEq/L (ref 3.7–5.3)
Potassium: 3.9 mEq/L (ref 3.7–5.3)
Sodium: 121 mEq/L — CL (ref 137–147)
Sodium: 122 mEq/L — ABNORMAL LOW (ref 137–147)
Sodium: 125 mEq/L — ABNORMAL LOW (ref 137–147)
Sodium: 126 mEq/L — ABNORMAL LOW (ref 137–147)

## 2014-01-07 LAB — CULTURE, BLOOD (ROUTINE X 2)
Culture: NO GROWTH
Culture: NO GROWTH

## 2014-01-07 LAB — GLUCOSE, CAPILLARY
GLUCOSE-CAPILLARY: 146 mg/dL — AB (ref 70–99)
Glucose-Capillary: 174 mg/dL — ABNORMAL HIGH (ref 70–99)
Glucose-Capillary: 164 mg/dL — ABNORMAL HIGH (ref 70–99)

## 2014-01-07 MED ORDER — ONDANSETRON HCL 4 MG/2ML IJ SOLN: 4.0000 mg | Freq: Four times a day (QID) | INTRAMUSCULAR | Status: DC | PRN

## 2014-01-07 NOTE — Progress Notes (Signed)
PROGRESS NOTE  CYRON HOAG [id number] DOB: 1945/12/19 DOA: 01/02/2014 PCP: [name], MD  Summary: 68 year old man with history of chronic pain syndrome, schizophrenia, diabetes mellitus presented to the emergency department at the insistence of his wife for increasing confusion. Initial evaluation suggested hyponatremia and acute encephalopathy/delirium related to narcotic overuse. Encephalopathy quickly resolved with withholding of narcotics. However hyponatremia persists, may be hypervolemic. Nephrology assisting with management. Also developed c. diff colitis.  Assessment/Plan: 1. Acute toxic encephalopathy with delirium. Clinically resolved despite continued hyponatremia. Infectious workup negative. Leukocytosis  resolved. No fever. Likely related to narcotic overuse. Consider adverse reaction from Geodon. No evidence of NMS or serotonin syndrome.  2. Narcotic abuse, overuse. Uses primarily for shortness of breath. Currently stable. Tylenol level normal. LFTs normal. 3. Hyponatremia, hypochloremia, Patient appears to be hypervolemic, but other possibilities include SIADH. He may have an element of polydipsia. It appears the patient is consuming excess free water, despite being on free water restriction. The importance of free water restriction was reiterated to patient, although insight may be poor. Serum sodium levels appears to be improving with current treatments.  He is on salt tabs and demeclocycline. He is also on lasix. Mentation appears normal. Appreciate nephrology assistance.  4. C. difficile colitis. Likely related to antibiotics on admission. Patient was started on metronidazole. Reports that diarrhea is improving. 5. Chronic pain syndrome. Continue to hold narcotics. No evidence of withdrawal. He does not appear to be having any significant pain at this time. 6. Diabetes mellitus, suspect uncontrolled. Hemoglobin A1c pending. Currently stable.  7. Schizophrenia. Stable. Can  likely restart Geodon 4/21. 8. Obstructive sleep apnea, noncompliant with CPAP. 9. Chronic hypoxic respiratory failure, stable.   .  Code Status: full code DVT prophylaxis: Lovenox Family Communication: None present Disposition Plan: Home when improved  Kathie Dike, MD  [facility]  Pager [phone] If 7PM-7AM, please contact night-coverage at [email or web address], password [facility] 01/07/2014, 7:21 PM  LOS: 5 days   Consultants:  Nephrology  Procedures:    Antibiotics:  Metronidazole 4/20 >>   HPI/Subjective: Patient is awake and alert.  Denies any new complaints today.  Objective: Filed Vitals:   01/06/14 2010 01/07/14 0607 01/07/14 0927 01/07/14 1310  BP: 137/52 130/56 138/79 149/89  Pulse: 79 78 83 76  Temp: 97.6 F (36.4 C) 97.8 F (36.6 C)  97.4 F (36.3 C)  TempSrc: Oral Oral  Oral  Resp: 20 20  20   Height:      Weight:      SpO2: 92% 94%  98%    Intake/Output Summary (Last 24 hours) at 01/07/14 1921 Last data filed at 01/07/14 1300  Gross per 24 hour  Intake    963 ml  Output    775 ml  Net    188 ml     Filed Weights   01/02/14 1634 01/03/14 0500 01/06/14 0501  Weight: 131.2 kg (289 lb 3.9 oz) 132.3 kg (291 lb 10.7 oz) 130.7 kg (288 lb 2.3 oz)    Exam:   Afebrile, vital signs are stable.  Gen. Afebrile, vital signs are stable. Chronic stable hypoxia 3 L.  Psychiatric. Grossly normal affect. Speech fluent and appropriate. Oriented to self, month, year, location.  Cardiovascular. RRR, no m/r/g. No LE edema  Respiratory. Coarse breath sounds bilaterally. Normal respiratory effort.  Abdomen soft, nontender, nondistended.  Skin appears grossly unremarkable.  Scheduled Meds: . demeclocycline  300 mg Oral Q12H  . enoxaparin (LOVENOX) injection  60 mg Subcutaneous Daily  . furosemide  20 mg Intravenous BID  . insulin aspart  0-9 Units Subcutaneous TID WC  . insulin glargine  30 Units Subcutaneous QPM  . metoprolol  50 mg Oral BID  .  metroNIDAZOLE  500 mg Oral 3 times per day  . pantoprazole  40 mg Oral Daily  . sodium chloride  3 mL Intravenous Q12H  . sodium chloride  2 g Oral TID WC   Continuous Infusions:    Active Problems:   Delirium   Acute encephalopathy   C. difficile colitis   Time spent 30 minutes

## 2014-01-07 NOTE — Progress Notes (Addendum)
Pt stating that he "hurts all over" he rates his pain 5/10 and is asking for pain medication. Pt has no PRN pain medication ordered at this time due to admission dx. I have also noticed that pt asks for pain medicine and within a few minutes he has fallen back asleep. MD is aware of this and will not order more pain medication at this time. Will continue to monitor.

## 2014-01-07 NOTE — Progress Notes (Addendum)
Subjective: Interval History: has no complaint. Denies any nausea or vomiting. No difficult in breathing  Objective: Vital signs in last 24 hours: Temp:  [97.1 F (36.2 C)-97.8 F (36.6 C)] 97.8 F (36.6 C) (04/23 0607) Pulse Rate:  [74-85] 83 (04/23 0927) Resp:  [20] 20 (04/23 0607) BP: (130-148)/(52-93) 138/79 mmHg (04/23 0927) SpO2:  [92 %-97 %] 94 % (04/23 0607) Weight change:   Intake/Output from previous day: 04/22 0701 - 04/23 0700 In: 2200 [P.O.:2200] Out: 1350 [Urine:1350] Intake/Output this shift: Total I/O In: 3 [I.V.:3] Out: -   Generally patient is sommolent but arrousable and presently communicating better Chest: Decreased breath sound has some expiratory wheezing Heart exam revealed regular rate and rhythm Abdomen: Obese, nontender and positive bowel sound Extremities: He has 1+ edema.   History b Results:  Recent Labs  01/06/14 0505  WBC 11.8*  HGB 12.2*  HCT 35.5*  PLT 197   BMET:   Recent Labs  01/06/14 2339 01/07/14 0513  NA 121* 125*  K 3.7 3.8  CL 80* 82*  CO2 32 32  GLUCOSE 202* 137*  BUN 5* 5*  CREATININE 0.66 0.64  CALCIUM 8.5 8.8   No results found for this basename: PTH,  in the last 72 hours Iron Studies: No results found for this basename: IRON, TIBC, TRANSFERRIN, FERRITIN,  in the last 72 hours  Studies/Results: No results found.  I have reviewed the patient's current medications.  Assessment/Plan:  problem #1 hyponatremia: Possibly hypovolemic hypernatremia. Presently patient with edema, high urine sodium and osmolality. At this moment SIADH cannot ruled out except for patient having leg edema. His BUN is low and also his uric acid level. His sodium is 125 improving. Patient on sodium tablet and demeclocycline  And sodium is improing Problem #2 C. difficile colitis Problem #3 sleep apnea Problem #4 diabetes Problem #5 paranoid schizophrenia Problem #6 coronary artery disease Problem #7 COPD Problem #8 altered mental  status: seems getting  better Plan: We'll continue with present treatment We'll follow his basic metabolic panel   LOS: 5 days   Varina Hulon S Bridey Brookover 01/07/2014,9:35 AM

## 2014-01-08 LAB — BASIC METABOLIC PANEL
BUN: 5 mg/dL — AB (ref 6–23)
BUN: 6 mg/dL (ref 6–23)
BUN: 6 mg/dL (ref 6–23)
BUN: 7 mg/dL (ref 6–23)
CALCIUM: 8.9 mg/dL (ref 8.4–10.5)
CO2: 27 meq/L (ref 19–32)
CO2: 33 mEq/L — ABNORMAL HIGH (ref 19–32)
CO2: 34 mEq/L — ABNORMAL HIGH (ref 19–32)
CO2: 36 meq/L — AB (ref 19–32)
CREATININE: 0.69 mg/dL (ref 0.50–1.35)
CREATININE: 0.77 mg/dL (ref 0.50–1.35)
Calcium: 8.6 mg/dL (ref 8.4–10.5)
Calcium: 9 mg/dL (ref 8.4–10.5)
Calcium: 9.4 mg/dL (ref 8.4–10.5)
Chloride: 82 mEq/L — ABNORMAL LOW (ref 96–112)
Chloride: 84 mEq/L — ABNORMAL LOW (ref 96–112)
Chloride: 81 mEq/L — ABNORMAL LOW (ref 96–112)
Chloride: 84 mEq/L — ABNORMAL LOW (ref 96–112)
Creatinine, Ser: 0.68 mg/dL (ref 0.50–1.35)
Creatinine, Ser: 0.68 mg/dL (ref 0.50–1.35)
GFR calc Af Amer: >90 mL/min (ref >90)
GFR calc Af Amer: >90 mL/min (ref >90)
GFR calc non Af Amer: >90 mL/min (ref >90)
GFR calc non Af Amer: >90 mL/min (ref >90)
GLUCOSE: 138 mg/dL — AB (ref 70–99)
Glucose, Bld: 128 mg/dL — ABNORMAL HIGH (ref 70–99)
Glucose, Bld: 264 mg/dL — ABNORMAL HIGH (ref 70–99)
Glucose, Bld: 208 mg/dL — ABNORMAL HIGH (ref 70–99)
Potassium: 3.8 mEq/L (ref 3.7–5.3)
Potassium: 3.8 mEq/L (ref 3.7–5.3)
Potassium: 3.8 mEq/L (ref 3.7–5.3)
Potassium: 3.9 mEq/L (ref 3.7–5.3)
Sodium: 123 mEq/L — ABNORMAL LOW (ref 137–147)
Sodium: 129 mEq/L — ABNORMAL LOW (ref 137–147)
Sodium: 125 mEq/L — ABNORMAL LOW (ref 137–147)
Sodium: 128 mEq/L — ABNORMAL LOW (ref 137–147)

## 2014-01-08 LAB — GLUCOSE, CAPILLARY
GLUCOSE-CAPILLARY: 171 mg/dL — AB (ref 70–99)
Glucose-Capillary: 238 mg/dL — ABNORMAL HIGH (ref 70–99)
Glucose-Capillary: 188 mg/dL — ABNORMAL HIGH (ref 70–99)
Glucose-Capillary: 207 mg/dL — ABNORMAL HIGH (ref 70–99)

## 2014-01-08 LAB — CBC
HEMATOCRIT: 38.9 % — AB (ref 39.0–52.0)
HEMOGLOBIN: 12.8 g/dL — AB (ref 13.0–17.0)
MCH: 28.3 pg (ref 26.0–34.0)
MCHC: 32.9 g/dL (ref 30.0–36.0)
MCV: 86.1 fL (ref 78.0–100.0)
Platelets: 222 10*3/uL (ref 150–400)
RBC: 4.52 MIL/uL (ref 4.22–5.81)
RDW: 11.8 % (ref 11.5–15.5)
WBC: 12.5 10*3/uL — ABNORMAL HIGH (ref 4.0–10.5)

## 2014-01-08 MED ORDER — ZIPRASIDONE HCL 80 MG PO CAPS
ORAL_CAPSULE | ORAL | Status: AC
  Filled 2014-01-08: qty 1

## 2014-01-08 MED ORDER — LORAZEPAM 1 MG PO TABS
1.0000 mg | ORAL_TABLET | Freq: Three times a day (TID) | ORAL | Status: DC | PRN
  Administered 2014-01-09 (×3): 1 mg via ORAL
  Filled 2014-01-08 (×3): qty 1

## 2014-01-08 MED ORDER — ZIPRASIDONE HCL 80 MG PO CAPS
80.0000 mg | ORAL_CAPSULE | Freq: Every evening | ORAL | Status: DC
  Administered 2014-01-08 – 2014-01-09 (×2): 80 mg via ORAL
  Filled 2014-01-08 (×3): qty 1

## 2014-01-08 NOTE — Progress Notes (Signed)
Patient still refusing BIPAP at night. No unit in room at this time.

## 2014-01-08 NOTE — Progress Notes (Signed)
PROGRESS NOTE  Rick Cruz [id number] DOB: Sep 15, 1946 DOA: 01/02/2014 PCP: [name], MD  Summary: 68 year old man with history of chronic pain syndrome, schizophrenia, diabetes mellitus presented to the emergency department at the insistence of his wife for increasing confusion. Initial evaluation suggested hyponatremia and acute encephalopathy/delirium related to narcotic overuse. Encephalopathy quickly resolved with withholding of narcotics. However hyponatremia persists, may be hypervolemic. Nephrology assisting with management. Also developed c. diff colitis.  Assessment/Plan: 1. Acute toxic encephalopathy with delirium. Clinically resolved despite continued hyponatremia. Infectious workup negative. Leukocytosis  resolved. No fever. Likely related to narcotic overuse. Consider adverse reaction from Geodon. No evidence of NMS or serotonin syndrome.  2. Narcotic abuse, overuse. Uses primarily for shortness of breath. Currently stable. Tylenol level normal. LFTs normal. 3. Hyponatremia, hypochloremia, Patient appears to be hypervolemic, but other possibilities include SIADH. He may have an element of polydipsia. It appears the patient is consuming excess free water, despite being on free water restriction. The importance of free water restriction was reiterated to patient, although insight may be poor. Serum sodium levels appears to be improving with current treatments.  He is on salt tabs and demeclocycline. He is also on lasix. Mentation appears normal. Appreciate nephrology assistance.  4. C. difficile colitis. Likely related to antibiotics on admission. Patient was started on metronidazole. Reports that diarrhea is improving. 5. Chronic pain syndrome. Continue to hold narcotics. No evidence of withdrawal. He does not appear to be having any significant pain at this time. 6. Diabetes mellitus, suspect uncontrolled. Hemoglobin A1c pending. Currently stable.  7. Schizophrenia. Stable.  Restarted on geodon 8. Obstructive sleep apnea, noncompliant with CPAP. 9. Chronic hypoxic respiratory failure, stable.   .  Code Status: full code DVT prophylaxis: Lovenox Family Communication: discussed with wife at the bedside Disposition Plan: Home when improved, possibly tomorrow  Kathie Dike, MD  [facility]  Pager [phone] If 7PM-7AM, please contact night-coverage at [email or web address], password [facility] 01/08/2014, 7:38 PM  LOS: 6 days   Consultants:  Nephrology  Procedures:    Antibiotics:  Metronidazole 4/20 >>   HPI/Subjective: Patient is drowsy today. Recently received ativan  Objective: Filed Vitals:   01/07/14 2027 01/08/14 0144 01/08/14 0356 01/08/14 1409  BP: 132/78  143/84 138/64  Pulse: 90  76 87  Temp: 98.6 F (37 C)  97.9 F (36.6 C) 98.3 F (36.8 C)  TempSrc: Oral  Oral Oral  Resp: 20  20 20   Height:      Weight:   128.1 kg (282 lb 6.6 oz)   SpO2: 92% 91% 95% 97%    Intake/Output Summary (Last 24 hours) at 01/08/14 1938 Last data filed at 01/08/14 1700  Gross per 24 hour  Intake    660 ml  Output   1600 ml  Net   -940 ml     Filed Weights   01/03/14 0500 01/06/14 0501 01/08/14 0356  Weight: 132.3 kg (291 lb 10.7 oz) 130.7 kg (288 lb 2.3 oz) 128.1 kg (282 lb 6.6 oz)    Exam:   Afebrile, vital signs are stable.  Gen. Afebrile, vital signs are stable. Chronic stable hypoxia 3 L.  Psychiatric. Drowsy today  Cardiovascular. RRR, no m/r/g. No LE edema  Respiratory. Coarse breath sounds bilaterally. Normal respiratory effort.  Abdomen soft, nontender, nondistended.  Skin appears grossly unremarkable.  Scheduled Meds: . demeclocycline  300 mg Oral Q12H  . enoxaparin (LOVENOX) injection  60 mg Subcutaneous Daily  . insulin aspart  0-9 Units Subcutaneous TID WC  .  insulin glargine  30 Units Subcutaneous QPM  . metoprolol  50 mg Oral BID  . metroNIDAZOLE  500 mg Oral 3 times per day  . pantoprazole  40 mg Oral Daily  .  sodium chloride  3 mL Intravenous Q12H  . sodium chloride  2 g Oral TID WC  . ziprasidone  80 mg Oral QPM   Continuous Infusions:    Active Problems:   Delirium   Acute encephalopathy   C. difficile colitis   Time spent 30 minutes

## 2014-01-08 NOTE — Progress Notes (Signed)
Subjective: Interval History: No new complaint. Denies any difficulty in breathing  Objective: Vital signs in last 24 hours: Temp:  [97.4 F (36.3 C)-98.6 F (37 C)] 97.9 F (36.6 C) (04/24 0356) Pulse Rate:  [76-90] 76 (04/24 0356) Resp:  [20] 20 (04/24 0356) BP: (132-149)/(78-89) 143/84 mmHg (04/24 0356) SpO2:  [91 %-98 %] 95 % (04/24 0356) Weight:  [128.1 kg (282 lb 6.6 oz)] 128.1 kg (282 lb 6.6 oz) (04/24 0356) Weight change:   Intake/Output from previous day: 04/23 0701 - 04/24 0700 In: 423 [P.O.:420; I.V.:3] Out: 1375 [Urine:1375] Intake/Output this shift: Total I/O In: -  Out: 450 [Urine:450]  Generally patient is sommolent but arrousable and presently communicating better Chest: Decreased breath sound has some expiratory wheezing Heart exam revealed regular rate and rhythm Abdomen: Obese, nontender and positive bowel sound Extremities: No edema  History b Results:  Recent Labs  01/06/14 0505 01/08/14 0517  WBC 11.8* 12.5*  HGB 12.2* 12.8*  HCT 35.5* 38.9*  PLT 197 222   BMET:   Recent Labs  01/08/14 0009 01/08/14 0517  NA 123* 129*  K 3.8 3.8  CL 82* 84*  CO2 27 33*  GLUCOSE 128* 138*  BUN 5* 6  CREATININE 0.69 0.68  CALCIUM 8.6 8.9   No results found for this basename: PTH,  in the last 72 hours Iron Studies: No results found for this basename: IRON, TIBC, TRANSFERRIN, FERRITIN,  in the last 72 hours  Studies/Results: No results found.  I have reviewed the patient's current medications.  Assessment/Plan:  problem #1 hyponatremia: Possibly hypovolemic hypernatremia. Presently patient with edema, high urine sodium and osmolality. At this moment SIADH cannot ruled out except for patient has leg edema. His BUN is low and also his uric acid level. His serum  sodium is 129 improving. Patient on sodium tablet and demeclocycline  And sodium is improing Problem #2 C. difficile colitis Problem #3 sleep apnea Problem #4 diabetes Problem #5 paranoid  schizophrenia Problem #6 coronary artery disease Problem #7 COPD Problem #8 altered mental status: seems getting  better Plan: We'll continue with present treatment We'll follow his basic metabolic panel   LOS: 6 days   Rick Cruz 01/08/2014,8:37 AM

## 2014-01-08 NOTE — Progress Notes (Signed)
PT Cancellation Note  Patient Details Name: Rick Cruz MRN: [id number] DOB: 05-06-1946   Cancelled Treatment:    Reason Eval/Treat Not Completed: Patient's level of consciousness Another attempt was made to work with pt.  He stated that he was still too sleepy and told me to "come back later".  I informed pt of my concern that he has been refusing to get OOB and may have difficulty now managing at home.  He states "I have a lift chair".  He does not seem to understand the big picture.  Sable Feil 01/08/2014, 11:12 AM

## 2014-01-08 NOTE — Progress Notes (Signed)
PT Cancellation Note  Patient Details Name: Rick Cruz MRN: [id number] DOB: 08/14/1946   Cancelled Treatment:    Reason Eval/Treat Not Completed: Patient's level of consciousness Pt sleeping and unable to arouse for more than seconds at a time.  He states that he did sleep well last night but still wants to sleep.  He could not be persuaded to try to awaken for me to participate in PT.  We will try again later if possible.  Sable Feil 01/08/2014, 9:15 AM

## 2014-01-09 ENCOUNTER — Inpatient Hospital Stay (HOSPITAL_COMMUNITY): Payer: Medicare Other

## 2014-01-09 LAB — GLUCOSE, CAPILLARY
GLUCOSE-CAPILLARY: 278 mg/dL — AB (ref 70–99)
Glucose-Capillary: 180 mg/dL — ABNORMAL HIGH (ref 70–99)
Glucose-Capillary: 213 mg/dL — ABNORMAL HIGH (ref 70–99)

## 2014-01-09 LAB — BASIC METABOLIC PANEL
BUN: 6 mg/dL (ref 6–23)
BUN: 6 mg/dL (ref 6–23)
BUN: 6 mg/dL (ref 6–23)
BUN: 7 mg/dL (ref 6–23)
CALCIUM: 9.6 mg/dL (ref 8.4–10.5)
CALCIUM: 9.3 mg/dL (ref 8.4–10.5)
CALCIUM: 9.5 mg/dL (ref 8.4–10.5)
CHLORIDE: 90 meq/L — AB (ref 96–112)
CO2: 36 mEq/L — ABNORMAL HIGH (ref 19–32)
CO2: 37 mEq/L — ABNORMAL HIGH (ref 19–32)
CO2: 37 meq/L — AB (ref 19–32)
CO2: 37 meq/L — AB (ref 19–32)
CREATININE: 0.69 mg/dL (ref 0.50–1.35)
CREATININE: 0.75 mg/dL (ref 0.50–1.35)
Calcium: 9.3 mg/dL (ref 8.4–10.5)
Chloride: 87 mEq/L — ABNORMAL LOW (ref 96–112)
Chloride: 88 mEq/L — ABNORMAL LOW (ref 96–112)
Chloride: 90 mEq/L — ABNORMAL LOW (ref 96–112)
Creatinine, Ser: 0.72 mg/dL (ref 0.50–1.35)
Creatinine, Ser: 0.78 mg/dL (ref 0.50–1.35)
GFR calc Af Amer: >90 mL/min (ref >90)
GFR calc Af Amer: >90 mL/min (ref >90)
GFR calc Af Amer: >90 mL/min (ref >90)
GFR calc non Af Amer: >90 mL/min (ref >90)
GLUCOSE: 193 mg/dL — AB (ref 70–99)
GLUCOSE: 203 mg/dL — AB (ref 70–99)
GLUCOSE: 214 mg/dL — AB (ref 70–99)
GLUCOSE: 271 mg/dL — AB (ref 70–99)
Potassium: 3.7 mEq/L (ref 3.7–5.3)
Potassium: 4 mEq/L (ref 3.7–5.3)
Potassium: 4.2 mEq/L (ref 3.7–5.3)
Potassium: 4.2 mEq/L (ref 3.7–5.3)
SODIUM: 136 meq/L — AB (ref 137–147)
Sodium: 131 mEq/L — ABNORMAL LOW (ref 137–147)
Sodium: 132 mEq/L — ABNORMAL LOW (ref 137–147)
Sodium: 135 mEq/L — ABNORMAL LOW (ref 137–147)

## 2014-01-09 LAB — BLOOD GAS, ARTERIAL
ACID-BASE EXCESS: 7.5 mmol/L — AB (ref 0.0–2.0)
Bicarbonate: 32.7 mEq/L — ABNORMAL HIGH (ref 20.0–24.0)
DRAWN BY: 105551
O2 CONTENT: 3 L/min
O2 Saturation: 91.6 %
PCO2 ART: 57.2 mmHg — AB (ref 35.0–45.0)
PO2 ART: 64.7 mmHg — AB (ref 80.0–100.0)
Patient temperature: 37
TCO2: 29.2 mmol/L (ref 0–100)
pH, Arterial: 7.375 (ref 7.350–7.450)

## 2014-01-09 MED ORDER — FUROSEMIDE 20 MG PO TABS
20.0000 mg | ORAL_TABLET | Freq: Every day | ORAL | Status: DC
  Administered 2014-01-09 – 2014-01-10 (×2): 20 mg via ORAL
  Filled 2014-01-09 (×2): qty 1

## 2014-01-09 NOTE — Progress Notes (Signed)
Subjective: Interval History: No new complaint. Denies any nuasea or vomiting. No difficulty in breathing  Objective: Vital signs in last 24 hours: Temp:  [97.5 F (36.4 C)-98.9 F (37.2 C)] 97.5 F (36.4 C) (04/25 0629) Pulse Rate:  [87-94] 94 (04/25 0629) Resp:  [20] 20 (04/25 0629) BP: (125-138)/(64-90) 133/90 mmHg (04/25 0629) SpO2:  [92 %-97 %] 95 % (04/25 0629) Weight change:   Intake/Output from previous day: 04/24 0701 - 04/25 0700 In: 480 [P.O.:480] Out: 1000 [Urine:1000] Intake/Output this shift:    Generally patient is sommolent but arrousable and presently communicating better Chest: Decreased breath sound has some expiratory wheezing Heart exam revealed regular rate and rhythm Abdomen: Obese, nontender and positive bowel sound Extremities: No edema  History b Results:  Recent Labs  01/08/14 0517  WBC 12.5*  HGB 12.8*  HCT 38.9*  PLT 222   BMET:   Recent Labs  01/08/14 2354 01/09/14 0551  NA 131* 136*  K 4.0 3.7  CL 87* 90*  CO2 37* 37*  GLUCOSE 214* 193*  BUN 7 6  CREATININE 0.78 0.72  CALCIUM 9.3 9.6   No results found for this basename: PTH,  in the last 72 hours Iron Studies: No results found for this basename: IRON, TIBC, TRANSFERRIN, FERRITIN,  in the last 72 hours  Studies/Results: No results found.  I have reviewed the patient's current medications.  Assessment/Plan:  problem #1 hyponatremia: Possibly hypovolemic hypernatremia. Presently patient with edema, high urine sodium and osmolality. At this moment SIADH cannot ruled out except for patient has leg edema. His BUN is low and also his uric acid level. His serum  sodium is 136 has corrected. Patient on sodium tablet and demeclocycline  And sodium is improing Problem #2 C. difficile colitis.Dirrhea is better Problem #3 sleep apnea Problem #4 diabetes Problem #5 paranoid schizophrenia Problem #6 coronary artery disease. No chest pain Problem #7 COPD Problem #8 altered mental  status: seems getting  better Plan: D/C Demeclocycline Continue with sodium tablet Lasix 20 mg po once a day Since his hyponatremia has correccted I will sign off Thank you   LOS: 7 days   Azora Bonzo S Carine Nordgren 01/09/2014,8:18 AM

## 2014-01-09 NOTE — Progress Notes (Signed)
1300 Late Entry:  Discussed patient care with Dr. Roderic Palau.  MD inquired about patient getting OOB to chair or ambulating.  I voiced to him that I was not sure that he could walk because the patient was very weak and barely able to stand on his own.  MD verbalized understanding.  MD states to reintroduce the option of SNF placement if the patient is unable to stand and walk.  He says to enforce the fact that if he chooses the SNF placement he would have to work physical therapy.  Otherwise if not the patient would be discharged home.  He asked me to also discuss this with the wife and that he would talk with the patient and his wife about the option when she arrived to the patients room.    Approximately 1330 Patient was found trying to get OOB without assistance spite his generalized weakness.   Staff attempted to assist him to the chair since he pivoted to the chair yesterday with moderate assistance.  His knee gave out on him while standing and he kneeled to the floor on one knee.  The patient was assisted in the chair and he sat up approx. 1 hour then he was transferred back to bed.  MD and his wife was notified of the situation and it was voiced to them with assessment the patient was not injured and he has no current complaints or concerns at that time.  I spoke on the phone with his wife of the option of SNF placement and the need for the patient to work with PT.  I suggested that she be with the patient during therapy for support.  She verbalized understanding and state she would be up later today.

## 2014-01-09 NOTE — Progress Notes (Signed)
PROGRESS NOTE  Rick Cruz [id number] DOB: Nov 18, 1945 DOA: 01/02/2014 PCP: [name], MD  Summary: 68 year old man with history of chronic pain syndrome, schizophrenia, diabetes mellitus presented to the emergency department at the insistence of his wife for increasing confusion. Initial evaluation suggested hyponatremia and acute encephalopathy/delirium related to narcotic overuse. Encephalopathy quickly resolved with withholding of narcotics. However hyponatremia persists, may be hypervolemic. Nephrology assisting with management. Also developed c. diff colitis.  Assessment/Plan: 1. Acute toxic encephalopathy with delirium. Patient appears to be lethargic today.  May be related to as needed ativan he was receiving for agitation.  He does appear more short of breath today.  With history of COPD, will check chest xray and abg to evaluate for co2 narcosis. 2. Narcotic abuse, overuse. Uses primarily for shortness of breath. Currently stable. Tylenol level normal. LFTs normal. 3. Hyponatremia, hypochloremia, Patient appears to be hypervolemic, but other possibilities include SIADH. He may have an element of polydipsia. It appears the patient is consuming excess free water, despite being on free water restriction. The importance of free water restriction was reiterated to patient, although insight may be poor. Serum sodium levels appears to be improving with current treatments.  He is on salt tabs and lasix. Demeclocycline has been discontinued on 4/25. Appreciate nephrology assistance.  4. C. difficile colitis. Likely related to antibiotics on admission. Patient was started on metronidazole. Reports that diarrhea is improving. 5. Chronic pain syndrome. Continue to hold narcotics. No evidence of withdrawal. He does not appear to be having any significant pain at this time. 6. Diabetes mellitus, suspect uncontrolled. Hemoglobin A1c pending. Currently stable.  7. Schizophrenia. Stable. Restarted on  geodon, but will hold since he is increasingly drowsy today 8. Obstructive sleep apnea, noncompliant with CPAP. 9. Chronic hypoxic respiratory failure, stable. 10. Generalized deconditioning. Patient has been refusing to work with physical therapy.  He wishes to go home and does not want to go to SNF.  Unfortunately, he is unable to stand.  His wife is concerned that his weakness may be related to over medication with ativan.  For this reason, ativan has been discontinued and will reassess in am.  Code Status: full code DVT prophylaxis: Lovenox Family Communication: discussed with wife at the bedside Disposition Plan: Home when improved  Kathie Dike, MD  [facility]  Pager [phone] If 7PM-7AM, please contact night-coverage at [email or web address], password [facility] 01/09/2014, 7:00 PM  LOS: 7 days   Consultants:  Nephrology  Procedures:    Antibiotics:  Metronidazole 4/20 >> 5/4  HPI/Subjective: Patient appears drowsy today. Also appears more short of breath.  Tried to get out of bed today. Objective: Filed Vitals:   01/08/14 1409 01/08/14 2006 01/09/14 0629 01/09/14 1336  BP: 138/64 125/80 133/90 116/79  Pulse: 87 87 94 83  Temp: 98.3 F (36.8 C) 98.9 F (37.2 C) 97.5 F (36.4 C) 98 F (36.7 C)  TempSrc: Oral Oral Oral Oral  Resp: 20 20 20 22   Height:      Weight:      SpO2: 97% 92% 95% 95%   No intake or output data in the 24 hours ending 01/09/14 1900   Filed Weights   01/03/14 0500 01/06/14 0501 01/08/14 0356  Weight: 132.3 kg (291 lb 10.7 oz) 130.7 kg (288 lb 2.3 oz) 128.1 kg (282 lb 6.6 oz)    Exam:   Afebrile, vital signs are stable.  Gen. Afebrile, vital signs are stable. Chronic stable hypoxia 3 L. Appears to be more short of  breath today.  Psychiatric. Drowsy today  Cardiovascular. RRR, no m/r/g. No LE edema  Respiratory. Diminished breath sounds but clear, appears to have mildly increased respiratory effort  Abdomen soft, nontender,  distended.  Skin appears grossly unremarkable.  Scheduled Meds: . enoxaparin (LOVENOX) injection  60 mg Subcutaneous Daily  . furosemide  20 mg Oral Daily  . insulin aspart  0-9 Units Subcutaneous TID WC  . insulin glargine  30 Units Subcutaneous QPM  . metoprolol  50 mg Oral BID  . metroNIDAZOLE  500 mg Oral 3 times per day  . pantoprazole  40 mg Oral Daily  . sodium chloride  3 mL Intravenous Q12H  . sodium chloride  2 g Oral TID WC   Continuous Infusions:    Active Problems:   Delirium   Acute encephalopathy   C. difficile colitis   Time spent 30 minutes

## 2014-01-10 LAB — BASIC METABOLIC PANEL
BUN: 6 mg/dL (ref 6–23)
BUN: 6 mg/dL (ref 6–23)
CALCIUM: 9.3 mg/dL (ref 8.4–10.5)
CO2: 35 meq/L — AB (ref 19–32)
CO2: 37 mEq/L — ABNORMAL HIGH (ref 19–32)
CREATININE: 0.75 mg/dL (ref 0.50–1.35)
Calcium: 9.2 mg/dL (ref 8.4–10.5)
Chloride: 90 mEq/L — ABNORMAL LOW (ref 96–112)
Chloride: 92 mEq/L — ABNORMAL LOW (ref 96–112)
Creatinine, Ser: 0.76 mg/dL (ref 0.50–1.35)
GFR calc Af Amer: >90 mL/min (ref >90)
GFR calc Af Amer: >90 mL/min (ref >90)
GFR calc non Af Amer: >90 mL/min (ref >90)
GLUCOSE: 212 mg/dL — AB (ref 70–99)
GLUCOSE: 221 mg/dL — AB (ref 70–99)
Potassium: 4 mEq/L (ref 3.7–5.3)
Potassium: 4.2 mEq/L (ref 3.7–5.3)
Sodium: 134 mEq/L — ABNORMAL LOW (ref 137–147)
Sodium: 137 mEq/L (ref 137–147)

## 2014-01-10 LAB — GLUCOSE, CAPILLARY
GLUCOSE-CAPILLARY: 328 mg/dL — AB (ref 70–99)
Glucose-Capillary: 214 mg/dL — ABNORMAL HIGH (ref 70–99)

## 2014-01-10 LAB — CBC
HCT: 41.8 % (ref 39.0–52.0)
Hemoglobin: 13.5 g/dL (ref 13.0–17.0)
MCH: 28.7 pg (ref 26.0–34.0)
MCHC: 32.3 g/dL (ref 30.0–36.0)
MCV: 88.9 fL (ref 78.0–100.0)
PLATELETS: 278 10*3/uL (ref 150–400)
RBC: 4.7 MIL/uL (ref 4.22–5.81)
RDW: 12.5 % (ref 11.5–15.5)
WBC: 11.5 10*3/uL — ABNORMAL HIGH (ref 4.0–10.5)

## 2014-01-10 MED ORDER — TRAZODONE HCL 150 MG PO TABS: 150.0000 mg | ORAL_TABLET | Freq: Every day | ORAL | Status: DC

## 2014-01-10 MED ORDER — SODIUM CHLORIDE 1 G PO TABS: 2.0000 g | ORAL_TABLET | Freq: Two times a day (BID) | ORAL | Status: DC

## 2014-01-10 MED ORDER — METRONIDAZOLE 500 MG PO TABS: 500.0000 mg | ORAL_TABLET | Freq: Three times a day (TID) | ORAL | Status: DC

## 2014-01-10 MED ORDER — FUROSEMIDE 20 MG PO TABS: 20.0000 mg | ORAL_TABLET | Freq: Every day | ORAL | Status: DC

## 2014-01-10 MED ORDER — SACCHAROMYCES BOULARDII 250 MG PO CAPS: 250.0000 mg | ORAL_CAPSULE | Freq: Two times a day (BID) | ORAL | Status: DC

## 2014-01-10 NOTE — Discharge Summary (Signed)
Physician Discharge Summary  Rick Cruz [id number] DOB: Sep 06, 1946 DOA: 01/02/2014  PCP: [name], MD  Admit date: 01/02/2014 Discharge date: 01/10/2014  Time spent: 45 minutes  Recommendations for Outpatient Follow-up:  1. Patient has a separate home health services. He'll be discharged home today. 2. Followup with primary care physician in one week 3. Repeat CBC and basic metabolic panel to followup on leukocytosis and serum sodium levels.  Discharge Diagnoses:  Acute toxic encephalopathy with delirium Narcotic abuse/overuse Hyponatremia Clostridium difficile colitis Chronic pain syndrome Diabetes Schizophrenia Obstructive sleep apnea, noncompliant with CPAP Chronic hypoxic respiratory failure on 3 L Generalized deconditioning Hypertension  Discharge Condition: improved  Diet recommendation: low salt  Filed Weights   01/06/14 0501 01/08/14 0356 01/10/14 0520  Weight: 130.7 kg (288 lb 2.3 oz) 128.1 kg (282 lb 6.6 oz) 124 kg (273 lb 5.9 oz)    History of present illness:  68 year old man with history of chronic pain syndrome, schizophrenia, diabetes mellitus presented to the emergency department at the insistence of his wife for increasing confusion. Initial evaluation suggested hyponatremia and acute encephalopathy/delirium related to narcotic overuse.  History obtained from wife at bedside. She insisted he come to the emergency department today because of confusion and abnormal behavior. Patient with history of chronic pain of neck and back on chronic narcotics. Maintained on fentanyl patch and Vicodin. For the last 4 days patient has been taking far more Vicodin than prescribed. According to pill count, wife estimates average pill intake 12 per day. He is also taking unspecified amounts of ibuprofen for pain. He has been intermittently confused for the last 4 days although he was sharp this morning. He has had poor appetite. He has eaten very little but has been  drinking an excessive amount of water and tea. He has had no focal symptoms, infectious symptoms or fever. No seizures or loss of consciousness. Generalized tremor at times. History of alcohol dependence, long in remission. The patient is delirious and unable to provide history.  According to chart documentation,EMS removed 50 mcg fentanyl patch prior to arrival.  In the emergency department afebrile, hypertensive, normal respiratory rate and stable hypoxia. Sodium 119, chloride 77, troponin negative. Lactic acid normal. WBC 13.9. Urinalysis unremarkable. Blood cultures and urine culture drawn. Chest x-ray suspected early pulmonary edema.   Hospital Course:  This patient was admitted to the hospital with altered mental status. His encephalopathy was felt to be multifactorial due to excessive use of pain medications as well as hyponatremia. Sedative/narcotics were held on admission. He was seen by nephrology for hyponatremia. Sodium on admission was 119, but fell as low as 116. Etiology of his hyponatremia was not entirely clear. He did appear to be somewhat hypervolemic, but also felt to be a component of SIADH. Patient has paranoid schizophrenia and family reports that he drinks excessive amounts of free water at home. He was placed on fluid restriction, low dose Lasix, salt tablets and demeclocycline. His serum sodium has gradually improved to normal range. Demeclocycline has been discontinued. Per nephrology recommendations, he'll be discharged home on low-dose Lasix as well as salt tablets. He's been advised to continue on fluid restriction. His narcotic/sedative pain medications will not be continued on discharge. Although he does have chronic pain syndrome, he appears to be reasonably comfortable at this time and is in agreement not to continue his narcotics at home. The patient also was noted to have frequent stools. He did test positive for Clostridium difficile. He was started on a course  of Flagyl.  His frequent stools appear to be improving now. We will also add a probiotic on discharge. He has been advised to complete his course of antibiotics. Regarding his COPD and chronic respiratory failure, this appears to be at baseline. The patient is deconditioned and has to ambulate with a walker. He is adamant about returning home and not going to a skilled nursing facility. Home health has been arranged. The patient will be discharged home today.  Procedures:    Consultations:  Nephrology  Discharge Exam: Filed Vitals:   01/10/14 1427  BP: 134/78  Pulse: 83  Temp: 97.7 F (36.5 C)  Resp: 20    General: awake, alert, NAD Cardiovascular: S1, s2 RRR Respiratory: CTA B  Discharge Instructions You were cared for by a hospitalist during your hospital stay. If you have any questions about your discharge medications or the care you received while you were in the hospital after you are discharged, you can call the unit and asked to speak with the hospitalist on call if the hospitalist that took care of you is not available. Once you are discharged, your primary care physician will handle any further medical issues. Please note that NO REFILLS for any discharge medications will be authorized once you are discharged, as it is imperative that you return to your primary care physician (or establish a relationship with a primary care physician if you do not have one) for your aftercare needs so that they can reassess your need for medications and monitor your lab values.  Discharge Orders   Future Orders Complete By Expires   Call MD for:  difficulty breathing, headache or visual disturbances  As directed    Call MD for:  extreme fatigue  As directed    Call MD for:  persistant dizziness or light-headedness  As directed    Call MD for:  temperature >100.4  As directed    Diet - low sodium heart healthy  As directed    Increase activity slowly  As directed        Medication List    STOP  taking these medications       HYDROcodone-acetaminophen 7.5-325 MG per tablet  Commonly known as:  NORCO      TAKE these medications       albuterol 108 (90 BASE) MCG/ACT inhaler  Commonly known as:  PROVENTIL HFA;VENTOLIN HFA  Inhale 2 puffs into the lungs every 6 (six) hours as needed for wheezing or shortness of breath.     Cinnamon 500 MG capsule  Take 500 mg by mouth daily.     furosemide 20 MG tablet  Commonly known as:  LASIX  Take 1 tablet (20 mg total) by mouth daily.     insulin lispro 100 UNIT/ML injection  Commonly known as:  HUMALOG  Inject 14 Units into the skin 3 (three) times daily.     LANTUS SOLOSTAR 100 UNIT/ML Solostar Pen  Generic drug:  Insulin Glargine  Inject 50 Units into the skin every evening.     metFORMIN 1000 MG tablet  Commonly known as:  GLUCOPHAGE  Take 1,000 mg by mouth 2 (two) times daily with a meal.     metoprolol 50 MG tablet  Commonly known as:  LOPRESSOR  Take 50 mg by mouth 2 (two) times daily.     metroNIDAZOLE 500 MG tablet  Commonly known as:  FLAGYL  Take 1 tablet (500 mg total) by mouth every 8 (eight) hours.  pantoprazole 40 MG tablet  Commonly known as:  PROTONIX  Take 30- 60 min before your first and last meals of the day     saccharomyces boulardii 250 MG capsule  Commonly known as:  FLORASTOR  Take 1 capsule (250 mg total) by mouth 2 (two) times daily.     simvastatin 40 MG tablet  Commonly known as:  ZOCOR  Take 40 mg by mouth every evening.     sodium chloride 1 G tablet  Take 2 tablets (2 g total) by mouth 2 (two) times daily with a meal.     traZODone 150 MG tablet  Commonly known as:  DESYREL  Take 1 tablet (150 mg total) by mouth at bedtime.     VICTOZA 18 MG/3ML Sopn  Generic drug:  Liraglutide  Inject 1.2 Units into the skin daily.     ziprasidone 80 MG capsule  Commonly known as:  GEODON  Take 80 mg by mouth every evening.       Allergies  Allergen Reactions  . Phenergan [Promethazine  Hcl] Other (See Comments)    Becomes very confused and aggressive       Follow-up Information   Follow up with [facility].   Contact information:   [address] [place] [address] [phone]       Follow up with [name], MD. Schedule an appointment as soon as possible for a visit in 1 week.   Specialty:  Internal Medicine   Contact information:   [address] [place] [address] [phone]        The results of significant diagnostics from this hospitalization (including imaging, microbiology, ancillary and laboratory) are listed below for reference.    Significant Diagnostic Studies: Ct Head Wo Contrast  01/02/2014   CLINICAL DATA:  Delirium. Acute encephalopathy. Diabetes. COPD. CHF. Schizophrenia.  EXAM: CT HEAD WITHOUT CONTRAST  TECHNIQUE: Contiguous axial images were obtained from the base of the skull through the vertex without intravenous contrast.  COMPARISON:  None.  FINDINGS: Sinuses/Soft tissues: Motion degradation, despite 2 attempts. Minimal mucosal thickening of the left ethmoid air cells. Clear mastoid air cells.  Intracranial: Advanced cerebral atrophy for age. Moderate low density in the periventricular white matter likely related to small vessel disease. No mass lesion, hemorrhage, hydrocephalus, acute infarct, intra-axial, or extra-axial fluid collection.  IMPRESSION: 1. Motion degraded exam. 2. Given this factor, no acute intracranial abnormality. 3.  Cerebral atrophy and small vessel ischemic change.   Electronically Signed   By: Abigail Miyamoto M.D.   On: 01/02/2014 16:37   Dg Chest Port 1 View  01/09/2014   CLINICAL DATA:  Cough, congestion, shortness of breath, history CHF, COPD, smoking, diabetes  EXAM: PORTABLE CHEST - 1 VIEW  COMPARISON:  Portable exam 1914 hr compared to 01/02/2014  FINDINGS: Enlargement of cardiac silhouette with pulmonary vascular congestion.  Atherosclerotic calcification of a minimally  elongated thoracic aorta.  Persistent accentuation of interstitial markings question mild pulmonary edema.  Appearance is similar to that seen on the previous exam.  No segmental consolidation, pleural effusion or pneumothorax.  IMPRESSION: Question minimal pulmonary edema.   Electronically Signed   By: Lavonia Dana M.D.   On: 01/09/2014 19:28   Dg Chest Port 1 View  01/02/2014   CLINICAL DATA:  Shortness of breath.  EXAM: PORTABLE CHEST - 1 VIEW  FINDINGS: Cardiac enlargement. Mild diffuse pulmonary opacities suggesting early pulmonary edema. Worsening aeration from priors. Negative osseous structures.  IMPRESSION: Cardiac enlargement.  Suspected early pulmonary edema.   Electronically Signed   By: Rolla Flatten M.D.   On: 01/02/2014 09:26   Dg Abd Portable 1v  01/02/2014   CLINICAL DATA:  Distended abdomen.  EXAM: PORTABLE ABDOMEN - 1 VIEW  COMPARISON:  None.  FINDINGS: Mild prominence of small bowel gas without obstruction or free air. No visible definite abnormal calcifications. Osseous structures unremarkable.  IMPRESSION: No visible obstruction or free air on this supine one view abdominal radiograph.   Electronically Signed   By: Rolla Flatten M.D.   On: 01/02/2014 16:40    Microbiology: Recent Results (from the past 240 hour(s))  CULTURE, BLOOD (ROUTINE X 2)     Status: None   Collection Time    01/02/14  9:25 AM      Result Value Ref Range Status   Specimen Description LEFT ANTECUBITAL   Final   Special Requests BOTTLES DRAWN AEROBIC AND ANAEROBIC 8CC EACH   Final   Culture NO GROWTH 5 DAYS   Final   Report Status 01/07/2014 FINAL   Final  CULTURE, BLOOD (ROUTINE X 2)     Status: None   Collection Time    01/02/14  9:25 AM      Result Value Ref Range Status   Specimen Description BLOOD RIGHT ANTECUBITAL   Final   Special Requests BOTTLES DRAWN AEROBIC AND ANAEROBIC 6CC EACH   Final   Culture NO GROWTH 5 DAYS   Final   Report Status 01/07/2014 FINAL   Final  URINE CULTURE     Status:  None   Collection Time    01/02/14 10:00 AM      Result Value Ref Range Status   Specimen Description URINE, CATHETERIZED   Final   Special Requests NONE   Final   Culture  Setup Time     Final   Value: 01/02/2014 22:56     Performed at [facility]     Final   Value: NO GROWTH     Performed at [facility]   Culture     Final   Value: NO GROWTH     Performed at [facility]   Report Status 01/03/2014 FINAL   Final  CLOSTRIDIUM DIFFICILE BY PCR     Status: Abnormal   Collection Time    01/04/14  2:20 AM      Result Value Ref Range Status   C difficile by pcr POSITIVE (*) NEGATIVE Final   Comment: CRITICAL RESULT CALLED TO, READ BACK BY AND VERIFIED WITH:      JAMES,T @ 0510 ON 01/04/14 BY WOODIE,J     Labs: Basic Metabolic Panel:  Recent Labs Lab 01/09/14 0551 01/09/14 1138 01/09/14 1744 01/09/14 2340 01/10/14 0608  NA 136* 132* 135* 134* 137  K 3.7 4.2 4.2 4.2 4.0  CL 90* 88* 90* 90* 92*  CO2 37* 37* 36* 37* 35*  GLUCOSE 193* 271* 203* 212* 221*  BUN 6 6 6 6 6   CREATININE 0.72 0.69 0.75 0.76 0.75  CALCIUM 9.6 9.3 9.5 9.3 9.2   Liver Function Tests: No results found for this basename: AST, ALT, ALKPHOS, BILITOT, PROT, ALBUMIN,  in the last 168 hours No results found for this basename: LIPASE, AMYLASE,  in the last 168 hours No results found for this basename: AMMONIA,  in the last 168 hours CBC:  Recent Labs Lab 01/06/14 0505 01/08/14 0517 01/10/14 0608  WBC 11.8* 12.5*  11.5*  HGB 12.2* 12.8* 13.5  HCT 35.5* 38.9* 41.8  MCV 83.5 86.1 88.9  PLT 197 222 278   Cardiac Enzymes: No results found for this basename: CKTOTAL, CKMB, CKMBINDEX, TROPONINI,  in the last 168 hours BNP: BNP (last 3 results)  Recent Labs  04/18/13 2012 01/02/14 0935  PROBNP 50.7 229.0*   CBG:  Recent Labs Lab 01/09/14 0745 01/09/14 1159 01/09/14 2226 01/10/14 0741 01/10/14 1103  GLUCAP 180* 213* 278* 214* 328*        Signed:  Topanga Alvelo  [facility] 01/10/2014, 7:41 PM

## 2014-01-10 NOTE — Discharge Instructions (Signed)
Hyponatremia   Hyponatremia is when the amount of salt (sodium) in your blood is too low. When sodium levels are low, your cells will absorb extra water and swell. The swelling happens throughout the body, but it mostly affects the brain. Severe brain swelling (cerebral edema), seizures, or coma can happen.   CAUSES    Heart, kidney, or liver problems.   Thyroid problems.   Adrenal gland problems.   Severe vomiting and diarrhea.   Certain medicines or illegal drugs.   Dehydration.   Drinking too much water.   Low-sodium diet.  SYMPTOMS    Nausea and vomiting.   Confusion.   Lethargy.   Agitation.   Headache.   Twitching or shaking (seizures).   Unconsciousness.   Appetite loss.   Muscle weakness and cramping.  DIAGNOSIS   Hyponatremia is identified by a simple blood test. Your caregiver will perform a history and physical exam to try to find the cause and type of hyponatremia. Other tests may be needed to measure the amount of sodium in your blood and urine.  TREATMENT   Treatment will depend on the cause.    Fluids may be given through the vein (IV).   Medicines may be used to correct the sodium imbalance. If medicines are causing the problem, they will need to be adjusted.   Water or fluid intake may be restricted to restore proper balance.  The speed of correcting the sodium problem is very important. If the problem is corrected too fast, nerve damage (sometimes unchangeable) can happen.  HOME CARE INSTRUCTIONS    Only take medicines as directed by your caregiver. Many medicines can make hyponatremia worse. Discuss all your medicines with your caregiver.   Carefully follow any recommended diet, including any fluid restrictions.   You may be asked to repeat lab tests. Follow these directions.   Avoid alcohol and recreational drugs.  SEEK MEDICAL CARE IF:    You develop worsening nausea, fatigue, headache, confusion, or weakness.   Your original hyponatremia symptoms return.   You have  problems following the recommended diet.  SEEK IMMEDIATE MEDICAL CARE IF:    You have a seizure.   You faint.   You have ongoing diarrhea or vomiting.  MAKE SURE YOU:    Understand these instructions.   Will watch your condition.   Will get help right away if you are not doing well or get worse.  Document Released: 08/24/2002 Document Revised: 11/26/2011 Document Reviewed: 02/18/2011  [employer] Patient Information 2014 [employer]

## 2014-01-11 LAB — GLUCOSE, CAPILLARY: GLUCOSE-CAPILLARY: 162 mg/dL — AB (ref 70–99)

## 2014-01-20 NOTE — Progress Notes (Signed)
UR chart review completed.  

## 2014-03-08 ENCOUNTER — Other Ambulatory Visit (HOSPITAL_COMMUNITY): Payer: Self-pay | Admitting: Cardiology

## 2014-03-08 ENCOUNTER — Ambulatory Visit (HOSPITAL_COMMUNITY)
Admission: RE | Admit: 2014-03-08 | Discharge: 2014-03-08 | Disposition: A | Discharge status: Home / Self Care | Payer: Medicare Other | Source: Ambulatory Visit | Attending: Cardiology | Admitting: Cardiology

## 2014-03-08 DIAGNOSIS — R0989 Other specified symptoms and signs involving the circulatory and respiratory systems: Secondary | ICD-10-CM | POA: Insufficient documentation

## 2014-03-08 DIAGNOSIS — R0600 Dyspnea, unspecified: Secondary | ICD-10-CM

## 2014-03-08 DIAGNOSIS — J4489 Other specified chronic obstructive pulmonary disease: Secondary | ICD-10-CM | POA: Insufficient documentation

## 2014-03-08 DIAGNOSIS — J441 Chronic obstructive pulmonary disease with (acute) exacerbation: Secondary | ICD-10-CM

## 2014-03-08 DIAGNOSIS — H34213 Partial retinal artery occlusion, bilateral: Secondary | ICD-10-CM

## 2014-03-08 DIAGNOSIS — J449 Chronic obstructive pulmonary disease, unspecified: Secondary | ICD-10-CM | POA: Insufficient documentation

## 2014-03-08 DIAGNOSIS — R0609 Other forms of dyspnea: Secondary | ICD-10-CM | POA: Insufficient documentation

## 2014-03-08 DIAGNOSIS — H34219 Partial retinal artery occlusion, unspecified eye: Secondary | ICD-10-CM | POA: Insufficient documentation

## 2014-03-08 DIAGNOSIS — I10 Essential (primary) hypertension: Secondary | ICD-10-CM | POA: Insufficient documentation

## 2014-03-08 DIAGNOSIS — H5310 Unspecified subjective visual disturbances: Secondary | ICD-10-CM

## 2014-03-08 NOTE — Progress Notes (Signed)
VASCULAR LAB PRELIMINARY  PRELIMINARY  PRELIMINARY  PRELIMINARY  Carotid duplex completed.    Preliminary report:  Bilateral:  1-39% ICA stenosis.  Vertebral artery flow is antegrade.     SLAUGHTER, [place], RVS 03/08/2014, 11:53 AM

## 2014-03-25 ENCOUNTER — Encounter (INDEPENDENT_AMBULATORY_CARE_PROVIDER_SITE_OTHER): Payer: Medicare Other | Admitting: Ophthalmology

## 2014-03-25 DIAGNOSIS — I10 Essential (primary) hypertension: Secondary | ICD-10-CM

## 2014-03-25 DIAGNOSIS — H34219 Partial retinal artery occlusion, unspecified eye: Secondary | ICD-10-CM

## 2014-03-25 DIAGNOSIS — H35039 Hypertensive retinopathy, unspecified eye: Secondary | ICD-10-CM

## 2014-03-25 DIAGNOSIS — H43819 Vitreous degeneration, unspecified eye: Secondary | ICD-10-CM

## 2014-03-25 DIAGNOSIS — H3410 Central retinal artery occlusion, unspecified eye: Secondary | ICD-10-CM

## 2014-05-08 ENCOUNTER — Inpatient Hospital Stay (HOSPITAL_COMMUNITY)
Admission: EM | Admit: 2014-05-08 | Discharge: 2014-05-11 | DRG: 562 | Disposition: A | Discharge status: Skilled Nursing Facility | Payer: Medicare Other | Attending: Internal Medicine | Admitting: Internal Medicine

## 2014-05-08 ENCOUNTER — Emergency Department (HOSPITAL_COMMUNITY): Payer: Medicare Other

## 2014-05-08 ENCOUNTER — Encounter (HOSPITAL_COMMUNITY): Payer: Self-pay | Admitting: Emergency Medicine

## 2014-05-08 DIAGNOSIS — S82892A Other fracture of left lower leg, initial encounter for closed fracture: Secondary | ICD-10-CM

## 2014-05-08 DIAGNOSIS — J9602 Acute respiratory failure with hypercapnia: Secondary | ICD-10-CM

## 2014-05-08 DIAGNOSIS — E872 Acidosis, unspecified: Secondary | ICD-10-CM | POA: Diagnosis present

## 2014-05-08 DIAGNOSIS — I10 Essential (primary) hypertension: Secondary | ICD-10-CM

## 2014-05-08 DIAGNOSIS — F200 Paranoid schizophrenia: Secondary | ICD-10-CM | POA: Diagnosis present

## 2014-05-08 DIAGNOSIS — W19XXXA Unspecified fall, initial encounter: Secondary | ICD-10-CM | POA: Diagnosis present

## 2014-05-08 DIAGNOSIS — Z8673 Personal history of transient ischemic attack (TIA), and cerebral infarction without residual deficits: Secondary | ICD-10-CM

## 2014-05-08 DIAGNOSIS — S82401A Unspecified fracture of shaft of right fibula, initial encounter for closed fracture: Secondary | ICD-10-CM

## 2014-05-08 DIAGNOSIS — R410 Disorientation, unspecified: Secondary | ICD-10-CM

## 2014-05-08 DIAGNOSIS — Z7902 Long term (current) use of antithrombotics/antiplatelets: Secondary | ICD-10-CM | POA: Diagnosis not present

## 2014-05-08 DIAGNOSIS — J9600 Acute respiratory failure, unspecified whether with hypoxia or hypercapnia: Secondary | ICD-10-CM

## 2014-05-08 DIAGNOSIS — Z9181 History of falling: Secondary | ICD-10-CM | POA: Diagnosis not present

## 2014-05-08 DIAGNOSIS — N189 Chronic kidney disease, unspecified: Secondary | ICD-10-CM | POA: Diagnosis present

## 2014-05-08 DIAGNOSIS — I2510 Atherosclerotic heart disease of native coronary artery without angina pectoris: Secondary | ICD-10-CM | POA: Diagnosis present

## 2014-05-08 DIAGNOSIS — I129 Hypertensive chronic kidney disease with stage 1 through stage 4 chronic kidney disease, or unspecified chronic kidney disease: Secondary | ICD-10-CM | POA: Diagnosis present

## 2014-05-08 DIAGNOSIS — J9620 Acute and chronic respiratory failure, unspecified whether with hypoxia or hypercapnia: Secondary | ICD-10-CM | POA: Diagnosis present

## 2014-05-08 DIAGNOSIS — Z91199 Patient's noncompliance with other medical treatment and regimen due to unspecified reason: Secondary | ICD-10-CM

## 2014-05-08 DIAGNOSIS — S82409A Unspecified fracture of shaft of unspecified fibula, initial encounter for closed fracture: Secondary | ICD-10-CM

## 2014-05-08 DIAGNOSIS — E7800 Pure hypercholesterolemia, unspecified: Secondary | ICD-10-CM | POA: Diagnosis present

## 2014-05-08 DIAGNOSIS — S82899A Other fracture of unspecified lower leg, initial encounter for closed fracture: Secondary | ICD-10-CM | POA: Diagnosis present

## 2014-05-08 DIAGNOSIS — Z87891 Personal history of nicotine dependence: Secondary | ICD-10-CM | POA: Diagnosis not present

## 2014-05-08 DIAGNOSIS — R339 Retention of urine, unspecified: Secondary | ICD-10-CM | POA: Diagnosis present

## 2014-05-08 DIAGNOSIS — E119 Type 2 diabetes mellitus without complications: Secondary | ICD-10-CM | POA: Diagnosis present

## 2014-05-08 DIAGNOSIS — Z6839 Body mass index (BMI) 39.0-39.9, adult: Secondary | ICD-10-CM | POA: Diagnosis not present

## 2014-05-08 DIAGNOSIS — J441 Chronic obstructive pulmonary disease with (acute) exacerbation: Secondary | ICD-10-CM | POA: Diagnosis present

## 2014-05-08 DIAGNOSIS — Z794 Long term (current) use of insulin: Secondary | ICD-10-CM | POA: Diagnosis not present

## 2014-05-08 DIAGNOSIS — R0600 Dyspnea, unspecified: Secondary | ICD-10-CM

## 2014-05-08 DIAGNOSIS — G8929 Other chronic pain: Secondary | ICD-10-CM | POA: Diagnosis present

## 2014-05-08 DIAGNOSIS — R0902 Hypoxemia: Secondary | ICD-10-CM

## 2014-05-08 DIAGNOSIS — Z9641 Presence of insulin pump (external) (internal): Secondary | ICD-10-CM

## 2014-05-08 DIAGNOSIS — R296 Repeated falls: Secondary | ICD-10-CM

## 2014-05-08 DIAGNOSIS — G4733 Obstructive sleep apnea (adult) (pediatric): Secondary | ICD-10-CM | POA: Diagnosis present

## 2014-05-08 DIAGNOSIS — R404 Transient alteration of awareness: Secondary | ICD-10-CM

## 2014-05-08 DIAGNOSIS — S8253XA Displaced fracture of medial malleolus of unspecified tibia, initial encounter for closed fracture: Secondary | ICD-10-CM | POA: Diagnosis present

## 2014-05-08 DIAGNOSIS — I509 Heart failure, unspecified: Secondary | ICD-10-CM | POA: Diagnosis present

## 2014-05-08 DIAGNOSIS — J9610 Chronic respiratory failure, unspecified whether with hypoxia or hypercapnia: Secondary | ICD-10-CM

## 2014-05-08 DIAGNOSIS — Z9981 Dependence on supplemental oxygen: Secondary | ICD-10-CM | POA: Diagnosis not present

## 2014-05-08 DIAGNOSIS — K2210 Ulcer of esophagus without bleeding: Secondary | ICD-10-CM

## 2014-05-08 DIAGNOSIS — Z7982 Long term (current) use of aspirin: Secondary | ICD-10-CM

## 2014-05-08 DIAGNOSIS — E875 Hyperkalemia: Secondary | ICD-10-CM

## 2014-05-08 DIAGNOSIS — G9340 Encephalopathy, unspecified: Secondary | ICD-10-CM | POA: Diagnosis present

## 2014-05-08 DIAGNOSIS — S82891A Other fracture of right lower leg, initial encounter for closed fracture: Secondary | ICD-10-CM | POA: Diagnosis present

## 2014-05-08 DIAGNOSIS — I5030 Unspecified diastolic (congestive) heart failure: Secondary | ICD-10-CM | POA: Diagnosis present

## 2014-05-08 DIAGNOSIS — A0472 Enterocolitis due to Clostridium difficile, not specified as recurrent: Secondary | ICD-10-CM

## 2014-05-08 DIAGNOSIS — R5381 Other malaise: Secondary | ICD-10-CM

## 2014-05-08 DIAGNOSIS — R5383 Other fatigue: Secondary | ICD-10-CM | POA: Diagnosis present

## 2014-05-08 DIAGNOSIS — J9611 Chronic respiratory failure with hypoxia: Secondary | ICD-10-CM

## 2014-05-08 DIAGNOSIS — Z9119 Patient's noncompliance with other medical treatment and regimen: Secondary | ICD-10-CM

## 2014-05-08 DIAGNOSIS — E785 Hyperlipidemia, unspecified: Secondary | ICD-10-CM

## 2014-05-08 DIAGNOSIS — R4182 Altered mental status, unspecified: Secondary | ICD-10-CM

## 2014-05-08 DIAGNOSIS — R531 Weakness: Secondary | ICD-10-CM

## 2014-05-08 LAB — COMPREHENSIVE METABOLIC PANEL
ALBUMIN: 3.2 g/dL — AB (ref 3.5–5.2)
ALT: 9 U/L (ref 0–53)
AST: 13 U/L (ref 0–37)
Alkaline Phosphatase: 79 U/L (ref 39–117)
Anion gap: 6 (ref 5–15)
BILIRUBIN TOTAL: 0.2 mg/dL — AB (ref 0.3–1.2)
BUN: 17 mg/dL (ref 6–23)
CHLORIDE: 96 meq/L (ref 96–112)
CO2: 42 mEq/L (ref 19–32)
CREATININE: 1.19 mg/dL (ref 0.50–1.35)
Calcium: 8.8 mg/dL (ref 8.4–10.5)
GFR calc Af Amer: 71 mL/min — ABNORMAL LOW (ref >90)
GFR calc non Af Amer: 61 mL/min — ABNORMAL LOW (ref >90)
Glucose, Bld: 144 mg/dL — ABNORMAL HIGH (ref 70–99)
Potassium: 5 mEq/L (ref 3.7–5.3)
Sodium: 144 mEq/L (ref 137–147)
Total Protein: 6.6 g/dL (ref 6.0–8.3)

## 2014-05-08 LAB — CBC WITH DIFFERENTIAL/PLATELET
Basophils Absolute: 0 10*3/uL (ref 0.0–0.1)
Basophils Relative: 0 % (ref 0–1)
Eosinophils Absolute: 0.2 10*3/uL (ref 0.0–0.7)
Eosinophils Relative: 2 % (ref 0–5)
HEMATOCRIT: 39.5 % (ref 39.0–52.0)
Hemoglobin: 11.2 g/dL — ABNORMAL LOW (ref 13.0–17.0)
Lymphocytes Relative: 9 % — ABNORMAL LOW (ref 12–46)
Lymphs Abs: 1 10*3/uL (ref 0.7–4.0)
MCH: 27.5 pg (ref 26.0–34.0)
MCHC: 28.4 g/dL — ABNORMAL LOW (ref 30.0–36.0)
MCV: 97.1 fL (ref 78.0–100.0)
MONO ABS: 0.8 10*3/uL (ref 0.1–1.0)
Monocytes Relative: 7 % (ref 3–12)
NEUTROS ABS: 9.1 10*3/uL — AB (ref 1.7–7.7)
NEUTROS PCT: 82 % — AB (ref 43–77)
Platelets: 206 10*3/uL (ref 150–400)
RBC: 4.07 MIL/uL — ABNORMAL LOW (ref 4.22–5.81)
RDW: 12.7 % (ref 11.5–15.5)
WBC: 11.1 10*3/uL — ABNORMAL HIGH (ref 4.0–10.5)

## 2014-05-08 LAB — TROPONIN I
Troponin I: <0.3 ng/mL (ref <0.30)
Troponin I: <0.3 ng/mL (ref <0.30)

## 2014-05-08 LAB — URINALYSIS, ROUTINE W REFLEX MICROSCOPIC
Bilirubin Urine: NEGATIVE
Glucose, UA: NEGATIVE mg/dL
HGB URINE DIPSTICK: NEGATIVE
Ketones, ur: 15 mg/dL — AB
Leukocytes, UA: NEGATIVE
Nitrite: NEGATIVE
PH: 6 (ref 5.0–8.0)
Protein, ur: NEGATIVE mg/dL
Specific Gravity, Urine: 1.015 (ref 1.005–1.030)
Urobilinogen, UA: 0.2 mg/dL (ref 0.0–1.0)

## 2014-05-08 LAB — BASIC METABOLIC PANEL
Anion gap: 3 — ABNORMAL LOW (ref 5–15)
BUN: 16 mg/dL (ref 6–23)
CALCIUM: 8.7 mg/dL (ref 8.4–10.5)
CO2: 44 meq/L — AB (ref 19–32)
Chloride: 98 mEq/L (ref 96–112)
Creatinine, Ser: 1.03 mg/dL (ref 0.50–1.35)
GFR calc Af Amer: 85 mL/min — ABNORMAL LOW (ref >90)
GFR calc non Af Amer: 73 mL/min — ABNORMAL LOW (ref >90)
GLUCOSE: 103 mg/dL — AB (ref 70–99)
Potassium: 5.1 mEq/L (ref 3.7–5.3)
Sodium: 145 mEq/L (ref 137–147)

## 2014-05-08 LAB — BLOOD GAS, ARTERIAL
Acid-Base Excess: 16.6 mmol/L — ABNORMAL HIGH (ref 0.0–2.0)
Acid-Base Excess: 14.7 mmol/L — ABNORMAL HIGH (ref 0.0–2.0)
BICARBONATE: 44 meq/L — AB (ref 20.0–24.0)
Bicarbonate: 40.3 mEq/L — ABNORMAL HIGH (ref 20.0–24.0)
DELIVERY SYSTEMS: POSITIVE
Drawn by: 23534
Drawn by: 23534
Expiratory PAP: 6
FIO2: 0.35 %
INSPIRATORY PAP: 16
O2 Content: 4 L/min
O2 Content: 35 L/min
O2 SAT: 91.8 %
O2 Saturation: 92.2 %
PCO2 ART: 66.5 mmHg — AB (ref 35.0–45.0)
PH ART: 7.272 — AB (ref 7.350–7.450)
PO2 ART: 61.4 mmHg — AB (ref 80.0–100.0)
Patient temperature: 37
Patient temperature: 37
TCO2: 41.7 mmol/L (ref 0–100)
TCO2: 37.1 mmol/L (ref 0–100)
pCO2 arterial: 98.8 mmHg (ref 35.0–45.0)
pH, Arterial: 7.4 (ref 7.350–7.450)
pO2, Arterial: 69.8 mmHg — ABNORMAL LOW (ref 80.0–100.0)

## 2014-05-08 LAB — CBC
HCT: 37.5 % — ABNORMAL LOW (ref 39.0–52.0)
Hemoglobin: 10.6 g/dL — ABNORMAL LOW (ref 13.0–17.0)
MCH: 27.3 pg (ref 26.0–34.0)
MCHC: 28.3 g/dL — ABNORMAL LOW (ref 30.0–36.0)
MCV: 96.6 fL (ref 78.0–100.0)
Platelets: 209 10*3/uL (ref 150–400)
RBC: 3.88 MIL/uL — AB (ref 4.22–5.81)
RDW: 12.9 % (ref 11.5–15.5)
WBC: 11.5 10*3/uL — AB (ref 4.0–10.5)

## 2014-05-08 LAB — GLUCOSE, CAPILLARY
GLUCOSE-CAPILLARY: 72 mg/dL (ref 70–99)
GLUCOSE-CAPILLARY: 243 mg/dL — AB (ref 70–99)
GLUCOSE-CAPILLARY: 254 mg/dL — AB (ref 70–99)
Glucose-Capillary: 91 mg/dL (ref 70–99)
Glucose-Capillary: 56 mg/dL — ABNORMAL LOW (ref 70–99)
Glucose-Capillary: 172 mg/dL — ABNORMAL HIGH (ref 70–99)

## 2014-05-08 LAB — LACTIC ACID, PLASMA
Lactic Acid, Venous: 1.2 mmol/L (ref 0.5–2.2)
Lactic Acid, Venous: 0.7 mmol/L (ref 0.5–2.2)

## 2014-05-08 LAB — MRSA PCR SCREENING: MRSA BY PCR: NEGATIVE

## 2014-05-08 MED ORDER — ONDANSETRON HCL 4 MG PO TABS
4.0000 mg | ORAL_TABLET | Freq: Four times a day (QID) | ORAL | Status: DC | PRN
Start: 2014-05-08 — End: 2014-05-11

## 2014-05-08 MED ORDER — CETYLPYRIDINIUM CHLORIDE 0.05 % MT LIQD
7.0000 mL | Freq: Two times a day (BID) | OROMUCOSAL | Status: DC
  Administered 2014-05-08 – 2014-05-11 (×7): 7 mL via OROMUCOSAL

## 2014-05-08 MED ORDER — METHYLPREDNISOLONE SODIUM SUCC 40 MG IJ SOLR
40.0000 mg | Freq: Four times a day (QID) | INTRAMUSCULAR | Status: DC
  Administered 2014-05-08 – 2014-05-09 (×4): 40 mg via INTRAVENOUS
  Filled 2014-05-08 (×4): qty 1

## 2014-05-08 MED ORDER — ONDANSETRON HCL 4 MG/2ML IJ SOLN: 4.0000 mg | Freq: Four times a day (QID) | INTRAMUSCULAR | Status: DC | PRN

## 2014-05-08 MED ORDER — DEXTROSE 50 % IV SOLN
25.0000 mL | Freq: Once | INTRAVENOUS | Status: AC | PRN
  Administered 2014-05-08: 25 mL via INTRAVENOUS

## 2014-05-08 MED ORDER — ENOXAPARIN SODIUM 40 MG/0.4ML ~~LOC~~ SOLN
40.0000 mg | SUBCUTANEOUS | Status: DC
  Administered 2014-05-09 – 2014-05-11 (×3): 40 mg via SUBCUTANEOUS
  Filled 2014-05-08 (×3): qty 0.4

## 2014-05-08 MED ORDER — CLOPIDOGREL BISULFATE 75 MG PO TABS
75.0000 mg | ORAL_TABLET | Freq: Every day | ORAL | Status: DC
  Administered 2014-05-08 – 2014-05-11 (×4): 75 mg via ORAL
  Filled 2014-05-08 (×4): qty 1

## 2014-05-08 MED ORDER — LORAZEPAM 2 MG/ML IJ SOLN
0.5000 mg | Freq: Once | INTRAMUSCULAR | Status: AC
  Administered 2014-05-08: 0.5 mg via INTRAVENOUS
  Filled 2014-05-08: qty 1

## 2014-05-08 MED ORDER — SODIUM CHLORIDE 0.9 % IJ SOLN
3.0000 mL | Freq: Two times a day (BID) | INTRAMUSCULAR | Status: DC
  Administered 2014-05-08 – 2014-05-10 (×6): 3 mL via INTRAVENOUS

## 2014-05-08 MED ORDER — SIMVASTATIN 20 MG PO TABS
40.0000 mg | ORAL_TABLET | Freq: Every evening | ORAL | Status: DC
  Administered 2014-05-09 – 2014-05-10 (×2): 40 mg via ORAL
  Filled 2014-05-08 (×2): qty 2

## 2014-05-08 MED ORDER — PANTOPRAZOLE SODIUM 40 MG PO TBEC
40.0000 mg | DELAYED_RELEASE_TABLET | Freq: Two times a day (BID) | ORAL | Status: DC
  Administered 2014-05-09 – 2014-05-11 (×5): 40 mg via ORAL
  Filled 2014-05-08 (×5): qty 1

## 2014-05-08 MED ORDER — DEXTROSE-NACL 5-0.45 % IV SOLN
INTRAVENOUS | Status: DC
  Administered 2014-05-08 – 2014-05-09 (×2): via INTRAVENOUS

## 2014-05-08 MED ORDER — SODIUM CHLORIDE 0.9 % IV SOLN: 250.0000 mL | INTRAVENOUS | Status: DC | PRN

## 2014-05-08 MED ORDER — INSULIN ASPART 100 UNIT/ML ~~LOC~~ SOLN
0.0000 [IU] | SUBCUTANEOUS | Status: DC
  Administered 2014-05-08: 3 [IU] via SUBCUTANEOUS
  Administered 2014-05-08: 5 [IU] via SUBCUTANEOUS
  Administered 2014-05-09 (×2): 7 [IU] via SUBCUTANEOUS
  Administered 2014-05-09: 5 [IU] via SUBCUTANEOUS

## 2014-05-08 MED ORDER — IPRATROPIUM-ALBUTEROL 0.5-2.5 (3) MG/3ML IN SOLN
3.0000 mL | RESPIRATORY_TRACT | Status: DC
  Administered 2014-05-08 – 2014-05-11 (×16): 3 mL via RESPIRATORY_TRACT
  Filled 2014-05-08 (×17): qty 3

## 2014-05-08 MED ORDER — SODIUM CHLORIDE 0.9 % IJ SOLN
3.0000 mL | INTRAMUSCULAR | Status: DC | PRN
  Administered 2014-05-08 (×2): 3 mL via INTRAVENOUS

## 2014-05-08 MED ORDER — DEXTROSE 50 % IV SOLN
INTRAVENOUS | Status: AC
  Filled 2014-05-08: qty 50

## 2014-05-08 MED ORDER — FENTANYL 50 MCG/HR TD PT72
50.0000 ug | MEDICATED_PATCH | TRANSDERMAL | Status: DC
  Administered 2014-05-08: 50 ug via TRANSDERMAL
  Filled 2014-05-08: qty 1

## 2014-05-08 MED ORDER — BRIMONIDINE TARTRATE 0.15 % OP SOLN
1.0000 [drp] | Freq: Two times a day (BID) | OPHTHALMIC | Status: DC
  Administered 2014-05-09 – 2014-05-11 (×5): 1 [drp] via OPHTHALMIC
  Filled 2014-05-08: qty 5

## 2014-05-08 MED ORDER — MORPHINE SULFATE 4 MG/ML IJ SOLN
6.0000 mg | Freq: Once | INTRAMUSCULAR | Status: AC
  Administered 2014-05-08: 6 mg via INTRAVENOUS
  Filled 2014-05-08: qty 2

## 2014-05-08 MED ORDER — HYDROMORPHONE HCL PF 1 MG/ML IJ SOLN
1.0000 mg | INTRAMUSCULAR | Status: DC | PRN
  Administered 2014-05-08 – 2014-05-10 (×5): 1 mg via INTRAVENOUS
  Filled 2014-05-08 (×5): qty 1

## 2014-05-08 MED ORDER — TRAZODONE HCL 50 MG PO TABS
150.0000 mg | ORAL_TABLET | Freq: Every day | ORAL | Status: DC
Start: 2014-05-08 — End: 2014-05-11
  Administered 2014-05-08 – 2014-05-10 (×3): 150 mg via ORAL
  Filled 2014-05-08 (×3): qty 3

## 2014-05-08 MED ORDER — ASPIRIN 81 MG PO CHEW
81.0000 mg | CHEWABLE_TABLET | Freq: Every day | ORAL | Status: DC
  Administered 2014-05-08 – 2014-05-11 (×4): 81 mg via ORAL
  Filled 2014-05-08 (×4): qty 1

## 2014-05-08 MED ORDER — ENOXAPARIN SODIUM 40 MG/0.4ML ~~LOC~~ SOLN
40.0000 mg | SUBCUTANEOUS | Status: DC
  Administered 2014-05-08: 40 mg via SUBCUTANEOUS
  Filled 2014-05-08: qty 0.4

## 2014-05-08 MED ORDER — SODIUM CHLORIDE 0.9 % IJ SOLN
3.0000 mL | Freq: Two times a day (BID) | INTRAMUSCULAR | Status: DC
  Administered 2014-05-08 – 2014-05-11 (×5): 3 mL via INTRAVENOUS

## 2014-05-08 NOTE — ED Provider Notes (Signed)
CSN: [id number]     Arrival date & time 05/08/14  0000 History   This chart was scribed for Rick Morn, MD, by Neta Ehlers, ED Scribe. This patient was seen in room APA14/[facility]14 and the patient's care was started at 12:22 AM.  First MD Initiated Contact with Patient 05/08/14 0004     No chief complaint on file.  The history is provided by the patient. No language interpreter was used.   HPI Comments: Rick Cruz is a 68 y.o. male, with a h/o COPD, CHF, and DM, who presents to the Emergency Department via EMS complaining of a fall which occurred this evening. Rick Cruz reports several episodes of falling this week, and he attributes the falls to losing his balance. He endorses lightheadedness and weakness when standing. He states increased weakness to his left arm and leg which began a few days ago and has worsened. He also endorses generalized headaches which have occurred for the past few days as well. Additionally he voices mild chest discomfort and urinary frequency. He denies nausea, emesis, diarrhea, or abdominal pain. He reports a h/o DM, and he states his blood sugar readings have oscillated between high and low. He denies alcohol or illicit drug usage. [name] is a former smoker.    Past Medical History  Diagnosis Date  . COPD (chronic obstructive pulmonary disease)   . Diabetes mellitus without complication   . CHF (congestive heart failure)     diastolic   . Hypercholesterolemia   . Depression   . Schizophrenia   . Sleep apnea     noncompliant with BiPAP  . Tobacco abuse   . On home O2     3 liters  . Chronic pain    Past Surgical History  Procedure Laterality Date  . Knee surgery      X2  . 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   . Colonoscopy  July 2011    Dr. Benson Norway: multiple polyps, internal and external hemorrhoids, diverticulosis. tubular adenoma  . Esophagogastroduodenoscopy  (egd) with propofol N/A 04/20/2013    Procedure: ESOPHAGOGASTRODUODENOSCOPY (EGD) WITH PROPOFOL;  Surgeon: Daneil Dolin, MD;  Location: [facility];  Service: Endoscopy;  Laterality: N/A;   Family History  Problem Relation Age of Onset  . Colon cancer Neg Hx   . Parkinson's disease     History  Substance Use Topics  . Smoking status: Former Smoker -- 1.00 packs/day for 50 years    Types: Cigarettes    Quit date: 09/16/2013  . Smokeless tobacco: Never Used  . Alcohol Use: No     Comment: history of ETOH abuse in remote past, none in at least 10 years    Review of Systems  A complete 10 system review of systems was obtained, and all systems were negative except where indicated in the HPI and PE.    Allergies  Phenergan  Home Medications   Prior to Admission medications   Medication Sig Start Date End Date Taking? Authorizing Provider  amantadine (SYMMETREL) 50 MG/5ML solution Take 100 mg by mouth 2 (two) times daily.   Yes Historical Provider, MD  aspirin 81 MG tablet Take 81 mg by mouth daily.   Yes Historical Provider, MD  brimonidine (ALPHAGAN) 0.15 % ophthalmic solution Place 1 drop into both eyes 2 (two) times daily.   Yes Historical Provider, MD  Cinnamon 500 MG capsule Take 500 mg  by mouth daily.   Yes Historical Provider, MD  clopidogrel (PLAVIX) 75 MG tablet Take 75 mg by mouth daily.   Yes Historical Provider, MD  fentaNYL (DURAGESIC - DOSED MCG/HR) 50 MCG/HR Place 50 mcg onto the skin every 3 (three) days.   Yes Historical Provider, MD  furosemide (LASIX) 20 MG tablet Take 1 tablet (20 mg total) by mouth daily. 01/10/14  Yes Kathie Dike, MD  gabapentin (NEURONTIN) 300 MG capsule Take 300 mg by mouth 3 (three) times daily.   Yes Historical Provider, MD  insulin lispro (HUMALOG) 100 UNIT/ML injection Inject 14 Units into the skin 3 (three) times daily.   Yes Historical Provider, MD  metoprolol (LOPRESSOR) 50 MG tablet Take 50 mg by mouth 2 (two) times daily.   Yes  Historical Provider, MD  pantoprazole (PROTONIX) 40 MG tablet Take 30- 60 min before your first and last meals of the day 05/06/13  Yes Tanda Rockers, MD  simvastatin (ZOCOR) 40 MG tablet Take 40 mg by mouth every evening.   Yes Historical Provider, MD  traZODone (DESYREL) 150 MG tablet Take 1 tablet (150 mg total) by mouth at bedtime. 01/10/14  Yes Kathie Dike, MD  triamcinolone cream (KENALOG) 0.1 % Apply 1 application topically 2 (two) times daily.   Yes Historical Provider, MD  albuterol (PROVENTIL HFA;VENTOLIN HFA) 108 (90 BASE) MCG/ACT inhaler Inhale 2 puffs into the lungs every 6 (six) hours as needed for wheezing or shortness of breath. 04/23/13   Samuella Cota, MD  Insulin Glargine (LANTUS SOLOSTAR) 100 UNIT/ML SOPN Inject 50 Units into the skin every evening.     Historical Provider, MD  Liraglutide (VICTOZA) 18 MG/3ML SOPN Inject 1.2 Units into the skin daily.    Historical Provider, MD  metFORMIN (GLUCOPHAGE) 1000 MG tablet Take 1,000 mg by mouth 2 (two) times daily with a meal.     Historical Provider, MD  metroNIDAZOLE (FLAGYL) 500 MG tablet Take 1 tablet (500 mg total) by mouth every 8 (eight) hours. 01/10/14   Kathie Dike, MD  saccharomyces boulardii (FLORASTOR) 250 MG capsule Take 1 capsule (250 mg total) by mouth 2 (two) times daily. 01/10/14   Kathie Dike, MD  sodium chloride 1 G tablet Take 2 tablets (2 g total) by mouth 2 (two) times daily with a meal. 01/10/14   Kathie Dike, MD  ziprasidone (GEODON) 80 MG capsule Take 80 mg by mouth every evening.    Historical Provider, MD   Triage Vitals: BP 106/62  Pulse 80  Temp(Src) 99.5 F (37.5 C) (Oral)  Resp 19  Ht 6' (1.829 m)  Wt 290 lb (131.543 kg)  BMI 39.32 kg/m2  SpO2 95%  Physical Exam  Nursing note and vitals reviewed. Constitutional: He is oriented to person, place, and time. He appears well-developed and well-nourished.  HENT:  Head: Normocephalic and atraumatic.  Eyes: EOM are normal. Pupils are equal,  round, and reactive to light.  Neck: Normal range of motion.  Cardiovascular: Normal rate, regular rhythm, normal heart sounds and intact distal pulses.   Pulmonary/Chest: Effort normal and breath sounds normal. No respiratory distress.  Abdominal: Soft. He exhibits no distension. There is no tenderness.  Musculoskeletal: Normal range of motion.  Neurological: He is alert and oriented to person, place, and time.  Strong grip bilaterally.   Skin: Skin is warm and dry.  Psychiatric: He has a normal mood and affect. Judgment normal.    ED Course  Procedures (including critical care time)  SPLINT APPLICATION  Authorized by: Rick Cruz Consent: Verbal consent obtained. Risks and benefits: risks, benefits and alternatives were discussed Consent given by: patient Splint applied by: nurse Location details: right lower leg Splint type: camp walker Supplies used: cam walker Post-procedure: The splinted body part was neurovascularly unchanged following the procedure. Patient tolerance: Patient tolerated the procedure well with no immediate complications.   SPLINT APPLICATION Authorized by: Rick Cruz Consent: Verbal consent obtained. Risks and benefits: risks, benefits and alternatives were discussed Consent given by: patient Splint applied by: nurse Location details: left lower leg Splint type: posterior splint Supplies used: ortho glass Post-procedure: The splinted body part was neurovascularly unchanged following the procedure. Patient tolerance: Patient tolerated the procedure well with no immediate complications.     DIAGNOSTIC STUDIES: Oxygen Saturation is 95% on 4 liter [place], adequate by my interpretation.    COORDINATION OF CARE:  12:35 AM- Discussed treatment plan with patient, and the patient agreed to the plan. The plan includes lab work.   Labs Review Labs Reviewed  CBC WITH DIFFERENTIAL - Abnormal; Notable for the following:    WBC 11.1 (*)    RBC 4.07 (*)     Hemoglobin 11.2 (*)    MCHC 28.4 (*)    Neutrophils Relative % 82 (*)    Neutro Abs 9.1 (*)    Lymphocytes Relative 9 (*)    All other components within normal limits  COMPREHENSIVE METABOLIC PANEL - Abnormal; Notable for the following:    CO2 42 (*)    Glucose, Bld 144 (*)    Albumin 3.2 (*)    Total Bilirubin 0.2 (*)    GFR calc non Af Amer 61 (*)    GFR calc Af Amer 71 (*)    All other components within normal limits  CULTURE, BLOOD (ROUTINE X 2)  CULTURE, BLOOD (ROUTINE X 2)  URINE CULTURE  TROPONIN I  LACTIC ACID, PLASMA  URINALYSIS, ROUTINE W REFLEX MICROSCOPIC    Imaging Review Dg Chest 1 View  05/08/2014   CLINICAL DATA:  Shortness of breath  EXAM: CHEST - 1 VIEW  COMPARISON:  01/09/2014  FINDINGS: Chronic cardiomegaly. Unchanged upper mediastinal contours. There is chronic pulmonary hyperinflation and interstitial coarsening. Prominent fat pad at the medial right base. Chronic bilateral extrapleural thickening. There is no edema, consolidation, effusion, or pneumothorax. Posttraumatic deformity of the lower right ribs.  IMPRESSION: COPD and cardiomegaly.  No acute superimposed findings.   Electronically Signed   By: Jorje Guild M.D.   On: 05/08/2014 02:01   Dg Ankle Complete Left  05/08/2014   CLINICAL DATA:  Ankle pain after fall.  EXAM: LEFT ANKLE COMPLETE - 3+ VIEW  COMPARISON:  None.  FINDINGS: There is an oblique, nondisplaced distal fibula fracture, at the level of the ankle joint. Medial malleolus avulsion fracture, presumably acute given the clinical circumstances. There is soft tissue swelling about the ankle. No malalignment. No hindfoot fracture.  IMPRESSION: Nondisplaced medial malleolus and distal fibula fractures.   Electronically Signed   By: Jorje Guild M.D.   On: 05/08/2014 02:58   Dg Ankle Complete Right  05/08/2014   CLINICAL DATA:  68 year old male status post fall with pain. Initial encounter.  EXAM: RIGHT ANKLE - COMPLETE 3+ VIEW  COMPARISON:   None.  FINDINGS: Oblique fracture distal right fibula meta diaphysis with mild lateral displacement and posterior angulation of the distal fragment. Mild lateral subluxation of the mortise joint. Talar dome intact. Distal tibia appears intact. Calcaneus intact.  IMPRESSION: Oblique fracture distal  right fibula with mild lateral displacement and lateral subluxation of the mortise joint.   Electronically Signed   By: Lars Pinks M.D.   On: 05/08/2014 02:52   Ct Head Wo Contrast  05/08/2014   CLINICAL DATA:  Left-sided weakness and headache.  EXAM: CT HEAD WITHOUT CONTRAST  TECHNIQUE: Contiguous axial images were obtained from the base of the skull through the vertex without intravenous contrast.  COMPARISON:  01/02/2014  FINDINGS: Skull and Sinuses:Negative for fracture or destructive process. The mastoids, middle ears, and imaged paranasal sinuses are clear.  Orbits: Bilateral cataract resection.  Brain: Diagnostic sensitivity decreased by motion, present despite repeated imaging. This could obscure small or subtle abnormalities. No evidence of acute abnormality, such as acute infarction, hemorrhage, hydrocephalus, or mass lesion/mass effect. There is generalized brain atrophy, advanced for age. Confluent periventricular white matter low density consistent with chronic small vessel disease - pattern stable from previous.  IMPRESSION: 1. As permitted by patient motion, no acute intracranial findings. 2. 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   Electronically Signed   By: Jorje Guild M.D.   On: 05/08/2014 01:53     EKG Interpretation   Date/Time:  Saturday May 08 2014 00:08:16 EDT Ventricular Rate:  79 PR Interval:  160 QRS Duration: 114 QT Interval:  383 QTC Calculation: 439 R Axis:   54 Text Interpretation:  Sinus rhythm Incomplete right bundle branch block  Borderline ST elevation, lateral leads No significant change was found  Confirmed by Denessa Cavan  MD, Laquinton Bihm ([id number]) on 05/08/2014  1:02:26 AM      MDM   Final diagnoses:  Weakness  Falls frequently  Right fibular fracture, closed, initial encounter  Closed left ankle fracture, initial encounter    Spoke with wife. Reports increased weakness since stroke in April. Increased falls over past several weeks. Bilateral ankle fractures.  Cam Walker to the right.  Posterior splint her last.  Patient be admitted the hospital for generalized weakness and PT OT given his nonweightbearing status bilaterally in his lower extremities.  I personally performed the services described in this documentation, which was scribed in my presence. The recorded information has been reviewed and is accurate.      Rick Morn, MD 05/08/14 [phone]

## 2014-05-08 NOTE — Consult Note (Signed)
Reason for Consult: Bilateral ankle fractures Referring Physician: Dr. [name] is an 68 y.o. male.  HPI: 68 year-old male with the following medical problems presents with bilateral ankle fractures after another fall: Past Medical History  Diagnosis Date  . COPD (chronic obstructive pulmonary disease)   . Diabetes mellitus without complication   . CHF (congestive heart failure)     diastolic   . Hypercholesterolemia   . Depression   . Schizophrenia   . Sleep apnea     noncompliant with BiPAP  . Tobacco abuse   . On home O2     3 liters  . Chronic pain    This morning he was transferred to the ICU. He has a splint on his left ankle in a Cam Walker on the right ankle.  History Past Medical History  Diagnosis Date  . COPD (chronic obstructive pulmonary disease)   . Diabetes mellitus without complication   . CHF (congestive heart failure)     diastolic   . Hypercholesterolemia   . Depression   . Schizophrenia   . Sleep apnea     noncompliant with BiPAP  . Tobacco abuse   . On home O2     3 liters  . Chronic pain     Past Surgical History  Procedure Laterality Date  . Knee surgery      X2  . 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   . Colonoscopy  July 2011    Dr. Benson Norway: multiple polyps, internal and external hemorrhoids, diverticulosis. tubular adenoma  . Esophagogastroduodenoscopy (egd) with propofol N/A 04/20/2013    Procedure: ESOPHAGOGASTRODUODENOSCOPY (EGD) WITH PROPOFOL;  Surgeon: Daneil Dolin, MD;  Location: [facility];  Service: Endoscopy;  Laterality: N/A;    Family History  Problem Relation Age of Onset  . Colon cancer Neg Hx   . Parkinson's disease      Social History:  reports that he quit smoking about 7 months ago. His smoking use included Cigarettes. He has a 50 pack-year smoking history. He has never used smokeless tobacco. He reports that he does not  drink alcohol or use illicit drugs.  Allergies:  Allergies  Allergen Reactions  . Phenergan [Promethazine Hcl] Other (See Comments)    Becomes very confused and aggressive    Medications:  Prior to Admission:  Prescriptions prior to admission  Medication Sig Dispense Refill  . aspirin 81 MG chewable tablet Chew 81 mg by mouth daily.      . brimonidine (ALPHAGAN) 0.15 % ophthalmic solution Place 1 drop into both eyes 2 (two) times daily.      . Cinnamon 500 MG capsule Take 500 mg by mouth daily.      . clopidogrel (PLAVIX) 75 MG tablet Take 75 mg by mouth daily.      . fentaNYL (DURAGESIC - DOSED MCG/HR) 50 MCG/HR Place 50 mcg onto the skin every 3 (three) days.      . furosemide (LASIX) 20 MG tablet Take 1 tablet (20 mg total) by mouth daily.  30 tablet  1  . gabapentin (NEURONTIN) 300 MG capsule Take 300 mg by mouth 3 (three) times daily.      . insulin lispro (HUMALOG) 100 UNIT/ML injection Inject 14 Units into the skin 3 (three) times daily.      . metoprolol (LOPRESSOR) 50 MG tablet Take 50 mg by mouth 2 (two) times daily.      Marland Kitchen  pantoprazole (PROTONIX) 40 MG tablet Take 30- 60 min before your first and last meals of the day  60 tablet  0  . simvastatin (ZOCOR) 40 MG tablet Take 40 mg by mouth every evening.      . terazosin (HYTRIN) 1 MG capsule Take 1 mg by mouth at bedtime.      . traZODone (DESYREL) 150 MG tablet Take 1 tablet (150 mg total) by mouth at bedtime.      . triamcinolone cream (KENALOG) 0.1 % Apply 1 application topically 2 (two) times daily.      Marland Kitchen albuterol (PROVENTIL HFA;VENTOLIN HFA) 108 (90 BASE) MCG/ACT inhaler Inhale 2 puffs into the lungs every 6 (six) hours as needed for wheezing or shortness of breath.  1 Inhaler  2   Dg Chest 1 View  05/08/2014   CLINICAL DATA:  Shortness of breath  EXAM: CHEST - 1 VIEW  COMPARISON:  01/09/2014  FINDINGS: Chronic cardiomegaly. Unchanged upper mediastinal contours. There is chronic pulmonary hyperinflation and interstitial  coarsening. Prominent fat pad at the medial right base. Chronic bilateral extrapleural thickening. There is no edema, consolidation, effusion, or pneumothorax. Posttraumatic deformity of the lower right ribs.  IMPRESSION: COPD and cardiomegaly.  No acute superimposed findings.   Electronically Signed   By: Jorje Guild M.D.   On: 05/08/2014 02:01   Dg Ankle Complete Left  05/08/2014   CLINICAL DATA:  Ankle pain after fall.  EXAM: LEFT ANKLE COMPLETE - 3+ VIEW  COMPARISON:  None.  FINDINGS: There is an oblique, nondisplaced distal fibula fracture, at the level of the ankle joint. Medial malleolus avulsion fracture, presumably acute given the clinical circumstances. There is soft tissue swelling about the ankle. No malalignment. No hindfoot fracture.  IMPRESSION: Nondisplaced medial malleolus and distal fibula fractures.   Electronically Signed   By: Jorje Guild M.D.   On: 05/08/2014 02:58   Dg Ankle Complete Right  05/08/2014   CLINICAL DATA:  68 year old male status post fall with pain. Initial encounter.  EXAM: RIGHT ANKLE - COMPLETE 3+ VIEW  COMPARISON:  None.  FINDINGS: Oblique fracture distal right fibula meta diaphysis with mild lateral displacement and posterior angulation of the distal fragment. Mild lateral subluxation of the mortise joint. Talar dome intact. Distal tibia appears intact. Calcaneus intact.  IMPRESSION: Oblique fracture distal right fibula with mild lateral displacement and lateral subluxation of the mortise joint.   Electronically Signed   By: Lars Pinks M.D.   On: 05/08/2014 02:52   Ct Head Wo Contrast  05/08/2014   CLINICAL DATA:  Left-sided weakness and headache.  EXAM: CT HEAD WITHOUT CONTRAST  TECHNIQUE: Contiguous axial images were obtained from the base of the skull through the vertex without intravenous contrast.  COMPARISON:  01/02/2014  FINDINGS: Skull and Sinuses:Negative for fracture or destructive process. The mastoids, middle ears, and imaged paranasal sinuses are  clear.  Orbits: Bilateral cataract resection.  Brain: Diagnostic sensitivity decreased by motion, present despite repeated imaging. This could obscure small or subtle abnormalities. No evidence of acute abnormality, such as acute infarction, hemorrhage, hydrocephalus, or mass lesion/mass effect. There is generalized brain atrophy, advanced for age. Confluent periventricular white matter low density consistent with chronic small vessel disease - pattern stable from previous.  IMPRESSION: 1. As permitted by patient motion, no acute intracranial findings. 2. 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   Electronically Signed   By: Jorje Guild M.D.   On: 05/08/2014 01:53   Correction to the interpretation of  the right ankle fracture report. There is no subluxation of the mortise. There is a slightly angulated lateral malleolus fracture  The left ankle fracture is a nondisplaced bimalleolar fracture agree with the interpretation of the x-ray. ROS Blood pressure 132/71, pulse 93, temperature 98.4 F (36.9 C), temperature source Oral, resp. rate 16, height 6' (1.829 m), weight 306 lb 14.1 oz (139.2 kg), SpO2 95.00%. Physical Exam  Assessment/Plan: The patient is not a surgical candidate at [facility]. His medical problems and breathing issues are too complex for general anesthesia.  He is nonweightbearing for 12 weeks  He should be in bilateral Cam Walkers, nonweightbearing  X-rays should be taken once a month for 3 months  Note: If the patient can be transferred to tertiary care facility that can handle his breathing issues he would be a candidate for surgery on the right ankle and casting on the left ankle with possible weightbearing as tolerated on the right after the surgery. However, the patient's down the ICU and it is doubtful that he would be a surgical candidate.  Arther Abbott 05/08/2014, 10:38 AM

## 2014-05-08 NOTE — Progress Notes (Signed)
Hypoglycemic Event  CBG: 56  Treatment: D50 IV 25 mL  Symptoms: confused  Follow-up CBG: Time:1150 CBG Result:72  Possible Reasons for Event: Medication regimen: insulin pump removed at 1100 when arrived to the floor.   Comments/MD notified:Dr. [place] Bing, Jamey Ripa  Remember to initiate Hypoglycemia Order Set & complete

## 2014-05-08 NOTE — ED Notes (Signed)
Per EMS, pt has fell several times this week, pt refused transport, but ask to come to ED tonight.

## 2014-05-08 NOTE — Progress Notes (Signed)
Fentanyl patch removed and wasted in sharps with L.Schonewitz,RN

## 2014-05-08 NOTE — Progress Notes (Signed)
Patient ID: Rick Cruz, male   DOB: [birth year], 68 y.o.   MRN: [id number]   1. Not a surgical candidate at [facility] based on his medical condition  2. Fractures DO NOT NEED SURGERY

## 2014-05-08 NOTE — Progress Notes (Signed)
Patient removed Bipap. Dr. Sarajane Jews in to see patient this evening.

## 2014-05-08 NOTE — ED Notes (Signed)
Pt unable to void on own, refused to allow in and out catheter to drain bladder.

## 2014-05-08 NOTE — Progress Notes (Signed)
PROGRESS NOTE  Rick Cruz [id number] DOB: Jul 16, 1946 DOA: 05/08/2014 PCP: [name], MD  Summary: 68 year old man with multiple medical problems presented with multiple complaints including several falls, and poor balance, increased weakness left arm and leg beginning a few days prior?, mild chest discomfort.  Assessment/Plan: 1. Acute hypercapnic respiratory failure, respiratory acidosis. Suspect secondary to noncompliance with CPAP/BiPAP superimposed on obstructive sleep apnea and oxygen-dependent COPD. Chest x-ray without infiltrate history and clinical findings do not suggest infection. Consider COPD exacerbation. 2. Acute encephalopathy, secondary to hypercapnic respiratory failure. 3. COPD oxygen dependent 3 L with chronic hypercarbic and hypoxic respiratory failure. Possible COPD exacerbation. 4. Bilateral ankle fractures status post fall at home. Await full orthopedic evaluation and recommendations but at this time no operative interventions recommended. 5. Multiple falls 6. Possible LUE/LLE weakness? Documented in EDP HPI but no focal deficits per EDP; I concur with exam. Doubt stroke. 7. Urinary retention 8. OSA noncompliant with BiPAP 9. DM type 2 managed with insulin pump. Stop pump. SSI. 10. Schizophrenia  11. Morbid obesity   Patient critically ill, will be transferred to the step down unit and started on BiPAP for acute hypercapnic respiratory failure. Follow clinically. Suspect secondary to noncompliance, hopefully will improve rapidly. No indication for intubation at this point.  Start steroids, bronchodilators  Follow-up orthopedic activity and followup recommendations. No surgical intervention planned at this point per preliminary recommendations.  Serial troponin.   Will discuss with family  Code Status: full code DVT prophylaxis: Lovenox Family Communication:  Disposition Plan: pending  Murray Hodgkins, MD  [facility]  Pager [phone] If  7PM-7AM, please contact night-coverage at [email or web address], password [facility] 05/08/2014, 10:17 AM  LOS: 0 days   Consultants:  Orthopedics   Procedures:    Antibiotics:    HPI/Subjective: Notified by nursing that the patient was somnolent this morning, duration unclear. Although he does awaken and follows simple commands he offers no history. Insulin pump in place, I have requested nurse to discontinue. Capillary blood sugars stable.  Objective: Filed Vitals:   05/08/14 0144 05/08/14 0426 05/08/14 0505 05/08/14 0810  BP:  101/60 100/71 132/71  Pulse:  78 81 93  Temp: 100 F (37.8 C)  98.4 F (36.9 C)   TempSrc: Rectal  Oral   Resp:  16 21 16   Height:      Weight:   139.2 kg (306 lb 14.1 oz)   SpO2:  92% 92% 95%   No intake or output data in the 24 hours ending 05/08/14 1017   Filed Weights   05/08/14 0002 05/08/14 0505  Weight: 131.543 kg (290 lb) 139.2 kg (306 lb 14.1 oz)    Exam:     Afebrile, vital signs stable. Hypoxia stable on 4 L. Gen. Sleeping. He immediately awakened to voice, follows simple commands moving all extremities. Falls asleep easily.  Psych. Difficult to assess. Does not speak.  Eyes. Pupils equal, round, reactive to light. Face appears symmetric.  ENT. Lips and tongue appear grossly unremarkable although the exam is limited.  Neck. Grossly unremarkable.  Cardiovascular. Regular rate and rhythm. No murmur, rub or gallop. No lower extremity edema although there is a large splint on the left leg and Cam Walker on the right limiting examination.  Respiratory. Poor air movement, no frank wheezes, rales or rhonchi. Normal respiratory effort. Slow respirations.  Abdomen. Obese, soft, nontender  Skin. Appears grossly unremarkable  Musculoskeletal. No definite focal deficits although exam is very limited.  Neurologic. No definite focal deficits, very limited  examination  Data Reviewed:  Capillary blood sugars are stable.  Arterial pH 7.272,  PCO2 98, PaO2 69  Chronic hypercarbia noted. Basic metabolic panel without acute abnormalities of note.  Troponin lactic acid on admission were unremarkable.   hemoglobin somewhat lower today, possibly dilutional.   Chest x-ray and CT of the head no acute process  EKG was sinus rhythm, early repolarization, no apparent acute changes  Scheduled Meds: . antiseptic oral rinse  7 mL Mouth Rinse BID  . clopidogrel  75 mg Oral Daily  . ipratropium-albuterol  3 mL Nebulization Q4H  . methylPREDNISolone (SOLU-MEDROL) injection  40 mg Intravenous Q6H  . sodium chloride  3 mL Intravenous Q12H  . sodium chloride  3 mL Intravenous Q12H   Continuous Infusions:   Principal Problem:   Acute respiratory failure with hypercapnia Active Problems:   OBSTRUCTIVE SLEEP APNEA   HYPERTENSION   COPD exacerbation   Paranoid schizophrenia   CAD (coronary artery disease)   Chronic respiratory failure 02 dep with hypercarbia   Recurrent falls   Ankle fracture, right   Ankle fracture, left   Time spent 45  minutes

## 2014-05-08 NOTE — Progress Notes (Signed)
Notified MD of patient's CO2 of 44 and informed that patient is difficult to arouse.  Vitals are stable.  Patient was seen by MD.  Will continue to monitor patient.

## 2014-05-08 NOTE — Progress Notes (Signed)
Noted patient has not urinated today. Has condom catheter in place. Bladder scan > 999 ml. Dr. Sarajane Jews paged.

## 2014-05-08 NOTE — H&P (Signed)
PCP:   [name], MD   Chief Complaint:  Falls, ankle pain  HPI: 68 yo male with h/o osa noncompliant with cpap/bipap, copd, morbid obesity, schizophrenia, crf on oxygen 3 L at home, very deconditioned comes in after experienced increase frequency of falls at home for several weeks.  Pt was hospitalized here in April for encephalopathy sounds like was due to use of his sedatives at home, was recommended to go to rehab then but he refused.  Per report by edp wife says he minimally gets up to walk anymore.  And when he does, he falls.  Today he fell, and was complaining of ankle pain.  Pt frequently refuses to come to the ED despite family members trying to get him to seek medical attention.  Today he agreed to come to ED because he hurt his ankles, and is found to have fractures in both ankles.   Pt denies any fevers.  No n/v/d.  No sob.  No cp.    Review of Systems:  Positive and negative as per HPI otherwise all other systems are negative  Past Medical History: Past Medical History  Diagnosis Date  . COPD (chronic obstructive pulmonary disease)   . Diabetes mellitus without complication   . CHF (congestive heart failure)     diastolic   . Hypercholesterolemia   . Depression   . Schizophrenia   . Sleep apnea     noncompliant with BiPAP  . Tobacco abuse   . On home O2     3 liters  . Chronic pain    Past Surgical History  Procedure Laterality Date  . Knee surgery      X2  . 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   . Colonoscopy  July 2011    Dr. Benson Norway: multiple polyps, internal and external hemorrhoids, diverticulosis. tubular adenoma  . Esophagogastroduodenoscopy (egd) with propofol N/Cruz 04/20/2013    Procedure: ESOPHAGOGASTRODUODENOSCOPY (EGD) WITH PROPOFOL;  Surgeon: Daneil Dolin, MD;  Location: [facility];  Service: Endoscopy;  Laterality: N/Cruz;    Medications: Prior to Admission medications    Medication Sig Start Date End Date Taking? Authorizing Provider  amantadine (SYMMETREL) 50 MG/5ML solution Take 100 mg by mouth 2 (two) times daily.   Yes Historical Provider, MD  aspirin 81 MG tablet Take 81 mg by mouth daily.   Yes Historical Provider, MD  brimonidine (ALPHAGAN) 0.15 % ophthalmic solution Place 1 drop into both eyes 2 (two) times daily.   Yes Historical Provider, MD  Cinnamon 500 MG capsule Take 500 mg by mouth daily.   Yes Historical Provider, MD  clopidogrel (PLAVIX) 75 MG tablet Take 75 mg by mouth daily.   Yes Historical Provider, MD  fentaNYL (DURAGESIC - DOSED MCG/HR) 50 MCG/HR Place 50 mcg onto the skin every 3 (three) days.   Yes Historical Provider, MD  furosemide (LASIX) 20 MG tablet Take 1 tablet (20 mg total) by mouth daily. 01/10/14  Yes Kathie Dike, MD  gabapentin (NEURONTIN) 300 MG capsule Take 300 mg by mouth 3 (three) times daily.   Yes Historical Provider, MD  insulin lispro (HUMALOG) 100 UNIT/ML injection Inject 14 Units into the skin 3 (three) times daily.   Yes Historical Provider, MD  metoprolol (LOPRESSOR) 50 MG tablet Take 50 mg by mouth 2 (two) times daily.   Yes Historical Provider, MD  pantoprazole (PROTONIX) 40 MG tablet Take 30- 60 min  before your first and last meals of the day 05/06/13  Yes Tanda Rockers, MD  simvastatin (ZOCOR) 40 MG tablet Take 40 mg by mouth every evening.   Yes Historical Provider, MD  traZODone (DESYREL) 150 MG tablet Take 1 tablet (150 mg total) by mouth at bedtime. 01/10/14  Yes Kathie Dike, MD  triamcinolone cream (KENALOG) 0.1 % Apply 1 application topically 2 (two) times daily.   Yes Historical Provider, MD  albuterol (PROVENTIL HFA;VENTOLIN HFA) 108 (90 BASE) MCG/ACT inhaler Inhale 2 puffs into the lungs every 6 (six) hours as needed for wheezing or shortness of breath. 04/23/13   Samuella Cota, MD  Insulin Glargine (LANTUS SOLOSTAR) 100 UNIT/ML SOPN Inject 50 Units into the skin every evening.     Historical  Provider, MD  Liraglutide (VICTOZA) 18 MG/3ML SOPN Inject 1.2 Units into the skin daily.    Historical Provider, MD  metFORMIN (GLUCOPHAGE) 1000 MG tablet Take 1,000 mg by mouth 2 (two) times daily with Cruz meal.     Historical Provider, MD  metroNIDAZOLE (FLAGYL) 500 MG tablet Take 1 tablet (500 mg total) by mouth every 8 (eight) hours. 01/10/14   Kathie Dike, MD  saccharomyces boulardii (FLORASTOR) 250 MG capsule Take 1 capsule (250 mg total) by mouth 2 (two) times daily. 01/10/14   Kathie Dike, MD  sodium chloride 1 G tablet Take 2 tablets (2 g total) by mouth 2 (two) times daily with Cruz meal. 01/10/14   Kathie Dike, MD  ziprasidone (GEODON) 80 MG capsule Take 80 mg by mouth every evening.    Historical Provider, MD    Allergies:   Allergies  Allergen Reactions  . Phenergan [Promethazine Hcl] Other (See Comments)    Becomes very confused and aggressive    Social History:  reports that he quit smoking about 7 months ago. His smoking use included Cigarettes. He has Cruz 50 pack-year smoking history. He has never used smokeless tobacco. He reports that he does not drink alcohol or use illicit drugs.  Family History: Family History  Problem Relation Age of Onset  . Colon cancer Neg Hx   . Parkinson's disease      Physical Exam: Filed Vitals:   05/08/14 0008 05/08/14 0012 05/08/14 0030 05/08/14 0144  BP:   111/96   Pulse:   78   Temp:    100 F (37.8 C)  TempSrc:    Rectal  Resp:      Height:      Weight:      SpO2: 96% 94% 99%    General appearance: alert, cooperative and no distress obese Head: Normocephalic, without obvious abnormality, atraumatic Eyes: negative Nose: Nares normal. Septum midline. Mucosa normal. No drainage or sinus tenderness. Neck: no JVD and supple, symmetrical, trachea midline Lungs: clear to auscultation bilaterally Heart: regular rate and rhythm, S1, S2 normal, no murmur, click, rub or gallop Abdomen: soft, non-tender; bowel sounds normal; no  masses,  no organomegaly Extremities: extremities normal, atraumatic, no cyanosis or edema Pulses: 2+ and symmetric Skin: Skin color, texture, turgor normal. No rashes or lesions Neurologic: Grossly normal oriented  Labs on Admission:   Recent Labs  05/08/14 0016  NA 144  K 5.0  CL 96  CO2 42*  GLUCOSE 144*  BUN 17  CREATININE 1.19  CALCIUM 8.8    Recent Labs  05/08/14 0016  AST 13  ALT 9  ALKPHOS 79  BILITOT 0.2*  PROT 6.6  ALBUMIN 3.2*    Recent Labs  05/08/14 0016  WBC 11.1*  NEUTROABS 9.1*  HGB 11.2*  HCT 39.5  MCV 97.1  PLT 206    Recent Labs  05/08/14 0016  TROPONINI <0.30   Radiological Exams on Admission: Dg Chest 1 View  05/08/2014   CLINICAL DATA:  Shortness of breath  EXAM: CHEST - 1 VIEW  COMPARISON:  01/09/2014  FINDINGS: Chronic cardiomegaly. Unchanged upper mediastinal contours. There is chronic pulmonary hyperinflation and interstitial coarsening. Prominent fat pad at the medial right base. Chronic bilateral extrapleural thickening. There is no edema, consolidation, effusion, or pneumothorax. Posttraumatic deformity of the lower right ribs.  IMPRESSION: COPD and cardiomegaly.  No acute superimposed findings.   Electronically Signed   By: Jorje Guild M.D.   On: 05/08/2014 02:01   Dg Ankle Complete Left  05/08/2014   CLINICAL DATA:  Ankle pain after fall.  EXAM: LEFT ANKLE COMPLETE - 3+ VIEW  COMPARISON:  None.  FINDINGS: There is an oblique, nondisplaced distal fibula fracture, at the level of the ankle joint. Medial malleolus avulsion fracture, presumably acute given the clinical circumstances. There is soft tissue swelling about the ankle. No malalignment. No hindfoot fracture.  IMPRESSION: Nondisplaced medial malleolus and distal fibula fractures.   Electronically Signed   By: Jorje Guild M.D.   On: 05/08/2014 02:58   Dg Ankle Complete Right  05/08/2014   CLINICAL DATA:  68 year old male status post fall with pain. Initial encounter.   EXAM: RIGHT ANKLE - COMPLETE 3+ VIEW  COMPARISON:  None.  FINDINGS: Oblique fracture distal right fibula meta diaphysis with mild lateral displacement and posterior angulation of the distal fragment. Mild lateral subluxation of the mortise joint. Talar dome intact. Distal tibia appears intact. Calcaneus intact.  IMPRESSION: Oblique fracture distal right fibula with mild lateral displacement and lateral subluxation of the mortise joint.   Electronically Signed   By: Lars Pinks M.D.   On: 05/08/2014 02:52   Ct Head Wo Contrast  05/08/2014   CLINICAL DATA:  Left-sided weakness and headache.  EXAM: CT HEAD WITHOUT CONTRAST  TECHNIQUE: Contiguous axial images were obtained from the base of the skull through the vertex without intravenous contrast.  COMPARISON:  01/02/2014  FINDINGS: Skull and Sinuses:Negative for fracture or destructive process. The mastoids, middle ears, and imaged paranasal sinuses are clear.  Orbits: Bilateral cataract resection.  Brain: Diagnostic sensitivity decreased by motion, present despite repeated imaging. This could obscure small or subtle abnormalities. No evidence of acute abnormality, such as acute infarction, hemorrhage, hydrocephalus, or mass lesion/mass effect. There is generalized brain atrophy, advanced for age. Confluent periventricular white matter low density consistent with chronic small vessel disease - pattern stable from previous.  IMPRESSION: 1. As permitted by patient motion, no acute intracranial findings. 2. 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   Electronically Signed   By: Jorje Guild M.D.   On: 05/08/2014 01:53    Assessment/Plan  68 yo male with frequent falls due to immobility with bilateral ankle fractures  Principal Problem:   Ankle fracture, left and right-  Admit, obtain ortho consult.  Will most certainly need rehab/snf at this point, as family will not be able to take care of him at home now with these acute fractures.  Await further recs  per ortho team.  Pain management may be Cruz problem.     Active Problems:  Stable unless o/w noted   OBSTRUCTIVE SLEEP APNEA   HYPERTENSION   Paranoid schizophrenia   CAD (coronary artery disease)  Chronic respiratory failure 02 dep with hypercarbia   Recurrent falls  Admit to tele.  Full code.  Clarify home meds in am.   Rick Cruz 05/08/2014, 3:43 AM

## 2014-05-08 NOTE — Progress Notes (Signed)
CRITICAL VALUE ALERT  Critical value received:  CO2  Date of notification:  05/08/2014  Time of notification:  0653  Critical value read back:Yes.    Nurse who received alert:  Miquel Dunn  MD notified (1st page):  Loralee Pacas.  Time of first page:  0655  MD notified (2nd page):  Time of second page:  Responding MD:    Time MD responded:

## 2014-05-08 NOTE — Progress Notes (Signed)
Patient transferred to stepdown unit, room 9.  Report was given to Nedine.  Patient's insulin pump was removed and in patient's room.

## 2014-05-08 NOTE — Progress Notes (Signed)
Pt would not keep BIPAP on got very agitated and pulled it off o2 placed back on and Nurse calling patient MD.

## 2014-05-08 NOTE — Progress Notes (Signed)
Dr. Sarajane Jews notified of patients ABG results.

## 2014-05-08 NOTE — ED Notes (Signed)
CRITICAL VALUE ALERT  Critical value received:  [id number]  Date of notification:  05/08/14  Time of notification:  0120  Critical value read back:Yes.    Nurse who received alert:  Y. Mordecai Tindol, RN  MD notified (1st page):  Dr. Venora Maples  Time of first page:  0122

## 2014-05-08 NOTE — Progress Notes (Signed)
Spoke with patient's wife and informed her that patient has been transferred to stepdown unit, room 9.  Patient's wife stated that she will be coming to visit later today.

## 2014-05-09 LAB — GLUCOSE, CAPILLARY
GLUCOSE-CAPILLARY: 341 mg/dL — AB (ref 70–99)
GLUCOSE-CAPILLARY: 377 mg/dL — AB (ref 70–99)
Glucose-Capillary: 272 mg/dL — ABNORMAL HIGH (ref 70–99)
Glucose-Capillary: 304 mg/dL — ABNORMAL HIGH (ref 70–99)
Glucose-Capillary: 339 mg/dL — ABNORMAL HIGH (ref 70–99)
Glucose-Capillary: 289 mg/dL — ABNORMAL HIGH (ref 70–99)

## 2014-05-09 MED ORDER — FENTANYL 50 MCG/HR TD PT72
50.0000 ug | MEDICATED_PATCH | TRANSDERMAL | Status: DC
  Administered 2014-05-09: 50 ug via TRANSDERMAL
  Filled 2014-05-09: qty 1

## 2014-05-09 MED ORDER — METOPROLOL TARTRATE 50 MG PO TABS
50.0000 mg | ORAL_TABLET | Freq: Two times a day (BID) | ORAL | Status: DC
  Administered 2014-05-09 – 2014-05-11 (×4): 50 mg via ORAL
  Filled 2014-05-09 (×4): qty 1

## 2014-05-09 MED ORDER — TERAZOSIN HCL 1 MG PO CAPS
1.0000 mg | ORAL_CAPSULE | Freq: Every day | ORAL | Status: DC
  Administered 2014-05-09 – 2014-05-10 (×2): 1 mg via ORAL
  Filled 2014-05-09 (×2): qty 1

## 2014-05-09 MED ORDER — INSULIN GLARGINE 100 UNIT/ML ~~LOC~~ SOLN
20.0000 [IU] | Freq: Every day | SUBCUTANEOUS | Status: DC
  Filled 2014-05-09 (×2): qty 0.2

## 2014-05-09 MED ORDER — TRIAMCINOLONE ACETONIDE 0.1 % EX CREA
1.0000 "application " | TOPICAL_CREAM | Freq: Two times a day (BID) | CUTANEOUS | Status: DC
  Administered 2014-05-10 – 2014-05-11 (×3): 1 via TOPICAL
  Filled 2014-05-09 (×3): qty 15

## 2014-05-09 MED ORDER — GABAPENTIN 300 MG PO CAPS
300.0000 mg | ORAL_CAPSULE | Freq: Three times a day (TID) | ORAL | Status: DC
  Administered 2014-05-09 – 2014-05-11 (×7): 300 mg via ORAL
  Filled 2014-05-09 (×7): qty 1

## 2014-05-09 MED ORDER — PREDNISONE 20 MG PO TABS
40.0000 mg | ORAL_TABLET | Freq: Every day | ORAL | Status: DC
  Administered 2014-05-10 – 2014-05-11 (×2): 40 mg via ORAL
  Filled 2014-05-09 (×2): qty 2

## 2014-05-09 MED ORDER — FUROSEMIDE 20 MG PO TABS
20.0000 mg | ORAL_TABLET | Freq: Every day | ORAL | Status: DC
  Administered 2014-05-09 – 2014-05-10 (×2): 20 mg via ORAL
  Filled 2014-05-09 (×3): qty 1

## 2014-05-09 MED ORDER — INSULIN ASPART 100 UNIT/ML ~~LOC~~ SOLN
0.0000 [IU] | Freq: Three times a day (TID) | SUBCUTANEOUS | Status: DC
  Administered 2014-05-09 (×2): 9 [IU] via SUBCUTANEOUS
  Administered 2014-05-10 (×2): 3 [IU] via SUBCUTANEOUS

## 2014-05-09 NOTE — Progress Notes (Signed)
Wife took patients insulin pump and clothes home.

## 2014-05-09 NOTE — Progress Notes (Signed)
PROGRESS NOTE  Rick Cruz [id number] DOB: 12-30-1945 DOA: 05/08/2014 PCP: [name], MD  Summary: 68 year old man with multiple medical problems presented with multiple complaints including several falls, and poor balance, increased weakness left arm and leg beginning a few days prior?, mild chest discomfort. Workup revealed bilateral ankle fractures and he was admitted for orthopedic evaluation. Shortly after admission he became obtunded and ABG revealed acute hypercapnic respiratory failure, history of stroke step down unit on BiPAP. He rapidly improved and now appears to be at baseline.  Assessment/Plan: 1. Acute hypercapnic respiratory failure, respiratory acidosis. Resolved with BiPAP. Secondary to noncompliance with BiPAP as an outpatient complicated by obstructive sleep apnea, oxygen dependent COPD. Chest x-ray unremarkable, no signs or symptoms of infection. Consider COPD exacerbation. 2. Acute encephalopathy secondary to hypercapnic respiratory failure, resolved. 3. Oxygen-dependent COPD 3 L with chronic hypercarbic and hypoxic respiratory failure, appears stable at this point. 4. Possible COPD exacerbation. Appears resolved at this point. 5. Bilateral ankle fractures, status post fall at home. Currently on split and Cam Walker 6. History of multiple falls 7. Possible left upper and left lower extremity weakness? Documented in EDP HPI, but no focal deficits noted. No evidence to suggest stroke. 8. Urinary retention. Likely secondary to acute illness. 9. Obstructive sleep apnea, noncompliant with BiPAP at home 10. Diabetes mellitus type 2, managed with insulin pump. Pump on hold. 11. Schizophrenia. Appears to be stable. Clarify home regimen. 12. Morbid obesity.   Patient feels much better today. Acute respiratory issues appear resolved. He is alert and oriented.   If remains stable, likely transfer to med floor today, continue oxygen, steroids, nebs, abx, BIPAP QHS.  Will  discuss with ortho in [place] to see whether operative intervention will be recommended before discharge. D/W patient, ok for [facility] if recommended.  Voiding trial tomorrow.  Start Lantus. Continue SSI  Code Status: full code DVT prophylaxis: Lovenox Family Communication:  Disposition Plan: pending  Murray Hodgkins, MD  [facility]  Pager [phone] If 7PM-7AM, please contact night-coverage at [email or web address], password [facility] 05/09/2014, 8:36 AM  LOS: 1 day   Consultants:  Orthopedics   Procedures:    Antibiotics:    HPI/Subjective: Urinary retention yesterday, >1 liter output after foley catheter placement. Orthopedics recommends consideration of fixation of at least one fracture to aid in mobility.  No issues overnight. Off BiPAP this AM. No pain right now. Ate breakfast. Breathing fine, no complaints.  Objective: Filed Vitals:   05/09/14 0700 05/09/14 0713 05/09/14 0730 05/09/14 0800  BP: 121/60   132/56  Pulse: 88  91 101  Temp:   98.4 F (36.9 C)   TempSrc:   Axillary   Resp: 14  13 14   Height:      Weight:      SpO2: 93% 93% 94% 95%    Intake/Output Summary (Last 24 hours) at 05/09/14 0836 Last data filed at 05/09/14 0500  Gross per 24 hour  Intake    305 ml  Output   2350 ml  Net  -2045 ml     Filed Weights   05/08/14 0002 05/08/14 0505 05/09/14 0500  Weight: 131.543 kg (290 lb) 139.2 kg (306 lb 14.1 oz) 133.2 kg (293 lb 10.4 oz)    Exam:     Tm 100.6, VSS  Gen. Appears calm, comfortable. BiPAP.  Psychiatric. Alert. Speech fluent and clear. Oriented to self, location, month, year. Follows commands briskly.  Cardiovascular regular rate and rhythm. No murmur, rub or gallop.  Respiratory clear  to auscultation bilaterally. No wheezes, rales or rhonchi. Normal respiratory effort.  Musculoskeletal moves all extremities to command.  Neurologic grossly nonfocal.  Data Reviewed:  Excellent urine output 2350  Capillary blood sugars  elevated  Repeat ABG last evening showed resolution of acute respiratory acidosis. Troponins negative. Urinalysis negative.  Scheduled Meds: . antiseptic oral rinse  7 mL Mouth Rinse BID  . aspirin  81 mg Oral Daily  . brimonidine  1 drop Both Eyes BID  . clopidogrel  75 mg Oral Daily  . enoxaparin (LOVENOX) injection  40 mg Subcutaneous Q24H  . insulin aspart  0-9 Units Subcutaneous Q4H  . ipratropium-albuterol  3 mL Nebulization Q4H  . methylPREDNISolone (SOLU-MEDROL) injection  40 mg Intravenous Q6H  . pantoprazole  40 mg Oral BID AC  . simvastatin  40 mg Oral QPM  . sodium chloride  3 mL Intravenous Q12H  . sodium chloride  3 mL Intravenous Q12H  . traZODone  150 mg Oral QHS   Continuous Infusions: . dextrose 5 % and 0.45% NaCl 50 mL/hr at 05/08/14 1254    Principal Problem:   Acute respiratory failure with hypercapnia Active Problems:   OBSTRUCTIVE SLEEP APNEA   HYPERTENSION   COPD exacerbation   Paranoid schizophrenia   CAD (coronary artery disease)   Chronic respiratory failure 02 dep with hypercarbia   Recurrent falls   Ankle fracture, right   Ankle fracture, left   Time spent 35  minutes

## 2014-05-09 NOTE — Progress Notes (Signed)
Report called to M.Wright,RN. Patient transferred to 336 in bed with NT

## 2014-05-09 NOTE — Progress Notes (Signed)
Dr. Luan Pulling placed on treatment team

## 2014-05-10 ENCOUNTER — Encounter (INDEPENDENT_AMBULATORY_CARE_PROVIDER_SITE_OTHER): Payer: Medicare Other | Admitting: Ophthalmology

## 2014-05-10 LAB — HEMOGLOBIN A1C
Hgb A1c MFr Bld: 6.6 % — ABNORMAL HIGH (ref <5.7)
Mean Plasma Glucose: 143 mg/dL — ABNORMAL HIGH (ref <117)

## 2014-05-10 LAB — GLUCOSE, CAPILLARY
GLUCOSE-CAPILLARY: 204 mg/dL — AB (ref 70–99)
GLUCOSE-CAPILLARY: 220 mg/dL — AB (ref 70–99)
Glucose-Capillary: 235 mg/dL — ABNORMAL HIGH (ref 70–99)
Glucose-Capillary: 279 mg/dL — ABNORMAL HIGH (ref 70–99)
Glucose-Capillary: 192 mg/dL — ABNORMAL HIGH (ref 70–99)

## 2014-05-10 MED ORDER — HYDROMORPHONE HCL PF 1 MG/ML IJ SOLN: 0.5000 mg | INTRAMUSCULAR | Status: DC | PRN

## 2014-05-10 MED ORDER — INSULIN ASPART 100 UNIT/ML ~~LOC~~ SOLN: 0.0000 [IU] | Freq: Every day | SUBCUTANEOUS | Status: DC

## 2014-05-10 MED ORDER — HYDROCODONE-ACETAMINOPHEN 5-325 MG PO TABS
1.0000 | ORAL_TABLET | ORAL | Status: DC | PRN
  Administered 2014-05-10 – 2014-05-11 (×3): 2 via ORAL
  Filled 2014-05-10 (×3): qty 2

## 2014-05-10 MED ORDER — INSULIN GLARGINE 100 UNIT/ML ~~LOC~~ SOLN
30.0000 [IU] | Freq: Every day | SUBCUTANEOUS | Status: DC
  Filled 2014-05-10 (×2): qty 0.3

## 2014-05-10 MED ORDER — INSULIN ASPART 100 UNIT/ML ~~LOC~~ SOLN
0.0000 [IU] | Freq: Three times a day (TID) | SUBCUTANEOUS | Status: DC
  Administered 2014-05-10: 8 [IU] via SUBCUTANEOUS
  Administered 2014-05-11 (×2): 3 [IU] via SUBCUTANEOUS

## 2014-05-10 MED ORDER — INSULIN GLARGINE 100 UNIT/ML ~~LOC~~ SOLN
20.0000 [IU] | Freq: Every day | SUBCUTANEOUS | Status: DC
  Administered 2014-05-10: 20 [IU] via SUBCUTANEOUS
  Filled 2014-05-10 (×2): qty 0.2

## 2014-05-10 NOTE — Progress Notes (Addendum)
Inpatient Diabetes Program Recommendations  AACE/ADA: New Consensus Statement on Inpatient Glycemic Control (2013)  Target Ranges:  Prepandial:   less than 140 mg/dL      Peak postprandial:   less than 180 mg/dL (1-2 hours)      Critically ill patients:  140 - 180 mg/dL   Results for Rick Cruz, Rick Cruz (MRN [id number]) as of 05/10/2014 10:20  Ref. Range 05/09/2014 00:02 05/09/2014 04:11 05/09/2014 07:27 05/09/2014 11:00 05/09/2014 16:09 05/09/2014 20:12 05/10/2014 03:03 05/10/2014 07:59  Glucose-Capillary Latest Range: 70-99 mg/dL 341 (H) 272 (H) 304 (H) 339 (H) 377 (H) 289 (H) 235 (H) 204 (H)    Diabetes history: DM2 Outpatient Diabetes medications: Medtronic insulin pump with Humalog Current orders for Inpatient glycemic control: Novolog 0-9 units AC  Inpatient Diabetes Program Recommendations Insulin - Basal: Please consider ordering Lantus 20 units daily starting now (based on 133 kg x 0.15 units). Correction (SSI): Please consider increasing Novolog correction scale to Moderate.  Note: Consult received for insulin pump recommendations. Patient reports that his is followed by Dr. Isaac Laud, PharmD at [facility] [phone]) for diabetes management. According to the patient he started using an insulin pump about 1 month ago. He states that he uses the Medtronic insulin pump with Humalog insulin. Patient does not have his insulin pump at the hospital as his "wife has taken it home". Therefore, not able to review insulin pump settings at this time. Since patient has been on his insulin pump he states that his blood glucose has ranged from 35-upper 300's mg/dl. Inquired about information he puts in insulin pump and he states that he does not know a lot about counting carbs yet so he enters 60 grams of carbs for every meal and uses the bolus wizard to calculate amount of insulin needed for correction and carbohydrates. Talked with patient at length regarding need for basal and bolus insulin  since his insulin pump has been removed. Patient reports that he understands now and is agreeable to taking Lantus insulin while his insulin pump is off. Called Dr. Theodoro Grist office to request insulin pump settings and awaiting return call with information.    Patient's glucose ranged from 272-341 mg/dl on 05/09/14 and patient received a total of Novolog 37 units for correction on 05/09/14. At this time, please consider ordering Lantus 20 units daily starting now (based on 133 kg x 0.15 units) and increasing Novolog correction to moderate scale.   05/11/14@7 :08am-Received return call from Santiago Glad at [facility] with insulin pump settings. According to Santiago Glad the following are insulin pump settings: Basal   4 units/hour (Total basal insulin 96 units per day) Carb ratio  1:4 (1 unit for every 4 grams of carbs) Insulin sensitivity 1:12 (1 unit drops glucose 12 mg/dl) Target Glucose 120 mg/dl    Thanks, Barnie Alderman, RN, MSN, CCRN Diabetes Coordinator Inpatient Diabetes Program [phone] (Team Pager) [phone] ([facility] office) [phone] [facility] office)

## 2014-05-10 NOTE — Progress Notes (Signed)
Foley catheter removed at this time as per physician order. Will monitor for output

## 2014-05-10 NOTE — Progress Notes (Signed)
[facility] PROGRESS NOTE  Rick Cruz [id number] DOB: 07-15-1946 DOA: 05/08/2014 PCP: [name], MD  Summary:  68 year old man with multiple medical problems presented with multiple complaints including several falls, and poor balance, increased weakness left arm and leg beginning a few days prior?, mild chest discomfort. Workup revealed bilateral ankle fractures and he was admitted for orthopedic evaluation. Shortly after admission he became obtunded and ABG revealed acute hypercapnic respiratory failure, history of stroke step down unit on BiPAP. He rapidly improved and now appears to be at baseline.   Assessment/Plan:  1. Acute hypercapnic respiratory failure, respiratory acidosis. Secondary to noncompliance with BiPAP as an outpatient complicated by obstructive sleep apnea, oxygen dependent COPD. Chest x-ray unremarkable, no signs or symptoms of infection. Continues to refuse BiPAP at night.  2. Acute encephalopathy secondary to hypercapnic respiratory failure, resolved. 3. 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 4. Possible COPD exacerbation. Appears at baseline. Continue oxygen, nebs and steroids. 5. Bilateral ankle fractures, status post fall at home. Currently on split and Cam Walker 6. History of multiple falls: etiology uncertain.  some concern regarding serum glucose and patients ability to manage insulin pump.   7. Possible left upper and left lower extremity weakness? Documented in EDP HPI, but no focal deficits noted. No evidence to suggest stroke. 8. Urinary retention. Likely secondary to acute illness. Will discontinue foley for voiding trial.  9. Obstructive sleep apnea, noncompliant with BiPAP at home and refusing here as well. 10. Diabetes mellitus type 2, managed with insulin pump. Pump on hold. Refusing Lantus. CBG range 204-289. Diabetes coordinator consult. Will obtain A1c.  Lantus  discontinued. 11. Schizophrenia. Appears to be stable.  12. Morbid obesity. BMI 39.4. Nutritional consult.   Code Status: full Family Communication: none present Disposition Plan: likely need placement   Consultants:  Dr Ninfa Linden orthopedics (phone consult with OP appointment scheduled  Procedures:  none  Antibiotics:  none  HPI/Subjective: Awake. Reports ankles " sore ". Denies sob.   Objective: Filed Vitals:   05/10/14 0916  BP: 124/63  Pulse: 73  Temp:   Resp:     Intake/Output Summary (Last 24 hours) at 05/10/14 1006 Last data filed at 05/10/14 0600  Gross per 24 hour  Intake 1244.67 ml  Output   3050 ml  Net -1805.33 ml   Filed Weights   05/08/14 0002 05/08/14 0505 05/09/14 0500  Weight: 131.543 kg (290 lb) 139.2 kg (306 lb 14.1 oz) 133.2 kg (293 lb 10.4 oz)    Exam:   General:  Obese appears comfortable  Cardiovascular: RRR No MGR   Respiratory: normal effort BS clear bilaterally no wheeze no rhonchi  Abdomen: obese soft +BS non-tender   Musculoskeletal:  Right ankle with CAM walker and splint left ankle. Toes warm to touch.   Data Reviewed: Basic Metabolic Panel:  Recent Labs Lab 05/08/14 0016 05/08/14 0612  NA 144 145  K 5.0 5.1  CL 96 98  CO2 42* 44*  GLUCOSE 144* 103*  BUN 17 16  CREATININE 1.19 1.03  CALCIUM 8.8 8.7   Liver Function Tests:  Recent Labs Lab 05/08/14 0016  AST 13  ALT 9  ALKPHOS 79  BILITOT 0.2*  PROT 6.6  ALBUMIN 3.2*   No results found for this basename: LIPASE, AMYLASE,  in the last 168 hours No results found for this basename: AMMONIA,  in the last 168 hours CBC:  Recent Labs Lab 05/08/14 0016 05/08/14  0612  WBC 11.1* 11.5*  NEUTROABS 9.1*  --   HGB 11.2* 10.6*  HCT 39.5 37.5*  MCV 97.1 96.6  PLT 206 209   Cardiac Enzymes:  Recent Labs Lab 05/08/14 0016 05/08/14 1050 05/08/14 1632 05/08/14 2230  TROPONINI <0.30 <0.30 <0.30 <0.30   BNP (last 3 results)  Recent Labs   01/02/14 0935  PROBNP 229.0*   CBG:  Recent Labs Lab 05/09/14 1100 05/09/14 1609 05/09/14 2012 05/10/14 0303 05/10/14 0759  GLUCAP 339* 377* 289* 235* 204*    Recent Results (from the past 240 hour(s))  CULTURE, BLOOD (ROUTINE X 2)     Status: None   Collection Time    05/08/14 12:23 AM      Result Value Ref Range Status   Specimen Description Blood RIGHT ANTECUBITAL   Final   Special Requests BOTTLES DRAWN AEROBIC AND ANAEROBIC 8CC   Final   Culture NO GROWTH 1 DAY   Final   Report Status PENDING   Incomplete  CULTURE, BLOOD (ROUTINE X 2)     Status: None   Collection Time    05/08/14 12:31 AM      Result Value Ref Range Status   Specimen Description RIGHT ANTECUBITAL   Final   Special Requests BOTTLES DRAWN AEROBIC AND ANAEROBIC 6CC   Final   Culture NO GROWTH 1 DAY   Final   Report Status PENDING   Incomplete  MRSA PCR SCREENING     Status: None   Collection Time    05/08/14 10:54 AM      Result Value Ref Range Status   MRSA by PCR NEGATIVE  NEGATIVE Final   Comment:            The GeneXpert MRSA Assay (FDA     approved for NASAL specimens     only), is one component of a     comprehensive MRSA colonization     surveillance program. It is not     intended to diagnose MRSA     infection nor to guide or     monitor treatment for     MRSA infections.     Studies: No results found.  Scheduled Meds: . antiseptic oral rinse  7 mL Mouth Rinse BID  . aspirin  81 mg Oral Daily  . brimonidine  1 drop Both Eyes BID  . clopidogrel  75 mg Oral Daily  . enoxaparin (LOVENOX) injection  40 mg Subcutaneous Q24H  . fentaNYL  50 mcg Transdermal Q72H  . furosemide  20 mg Oral Daily  . gabapentin  300 mg Oral TID  . insulin aspart  0-9 Units Subcutaneous TID WC  . ipratropium-albuterol  3 mL Nebulization Q4H  . metoprolol  50 mg Oral BID  . pantoprazole  40 mg Oral BID AC  . predniSONE  40 mg Oral Q breakfast  . simvastatin  40 mg Oral QPM  . sodium chloride  3 mL  Intravenous Q12H  . sodium chloride  3 mL Intravenous Q12H  . terazosin  1 mg Oral QHS  . traZODone  150 mg Oral QHS  . triamcinolone cream  1 application Topical BID   Continuous Infusions:   Principal Problem:   Acute respiratory failure with hypercapnia Active Problems:   OBSTRUCTIVE SLEEP APNEA   HYPERTENSION   COPD exacerbation   Paranoid schizophrenia   CAD (coronary artery disease)   Chronic respiratory failure 02 dep with hypercarbia   Recurrent falls   Ankle fracture, right  Ankle fracture, left    Time spent: 35 minutes    [place] [facility] Pager [phone]. If 7PM-7AM, please contact night-coverage at [email or web address], password [facility] 05/10/2014, 10:06 AM  LOS: 2 days

## 2014-05-10 NOTE — Progress Notes (Signed)
Patient has voided multiple times today. Bladder scanner shows 250 ml at this time. Discussed with Dr. Sarajane Jews. Will continue to monitor.

## 2014-05-10 NOTE — Progress Notes (Signed)
Pt refused to wear machine. Pt states he just can't do it. I asked that he bring his machine from home and he states he don't have it that he turned it back in. Pt back on 3lpm cann . Nurse [facility] informed.

## 2014-05-10 NOTE — Care Management Note (Addendum)
    Page 1 of 1   05/11/2014     2:59:25 PM CARE MANAGEMENT NOTE 05/11/2014  Patient:  Rick Cruz, Rick Cruz   Account Number:  [id number]  Date Initiated:  05/10/2014  Documentation initiated by:  Jolene Provost  Subjective/Objective Assessment:   Pt from home, lives with wife. Prior to admission pt was independent with ADL's and had no HH services or medication needs. Pt did have home 02 from [place].  Pt states he is open to placement for bilateral ankle fx.     Action/Plan:   CSW has been consulted to arrange for placement per MD's request. Pt has no CM needs at this time. (pt says he has worked with [facility] in the past and would be interested in working with them again if needed).   Anticipated DC Date:  05/11/2014   Anticipated DC Plan:  SKILLED NURSING FACILITY  In-house referral  Clinical Social Worker      DC Planning Services  CM consult      Choice offered to / List presented to:             Status of service:  Completed, signed off Medicare Important Message given?  YES (If response is "NO", the following Medicare IM given date fields will be blank) Date Medicare IM given:  05/11/2014 Medicare IM given by:  Jolene Provost Date Additional Medicare IM given:   Additional Medicare IM given by:    Discharge Disposition:  [place]  Per UR Regulation:    If discussed at Long Length of Stay Meetings, dates discussed:    Comments:  05/11/2014 [place], RN, MSN, PCCN Pt plans to discharge to SNF today. Pt's wife and RN aware of DC arrangements. CSW to arrange for discharge planning. pt has no CM needs at this time. 05/10/2014 [place], RN, MSN, [employer]

## 2014-05-10 NOTE — Progress Notes (Signed)
UR review complete.  

## 2014-05-10 NOTE — Progress Notes (Signed)
Patient refusing to take lantus because he was told never to take lantus when he was trained on his insulin pump. Explained to patient he is not on pump right. He continued to refuse.. Dr. Sarajane Jews notified.

## 2014-05-10 NOTE — Progress Notes (Signed)
Patient seen, independently examined and chart reviewed. I agree with exam, assessment and plan discussed with Dyanne Carrel, NP.  Subjective/Interval history: No issues overnight the patient did refuse she uses BiPAP. He has also been refusing long-acting insulin.  Overall is feeling okay but does complain of bilateral ankle pain.  Objective: Afebrile, vital signs stable. Stable hypoxia.  Gen. Appears calm, comfortable.  Psych. Alert. Speech fluent and clear.  Cardiovascular. Regular rate and rhythm. No murmur, or gallop.  Respiratory. Clear to auscultation bilaterally. No wheezes, rales or rhonchi. Normal respiratory effort.  Musculoskeletal. Cam Walker in right foot. Splint left foot. Perfusion of the feet appears intact. Moves toes both feet.    Blood sugars stable  Overall the patient's condition seems to have stabilized, acute hypercapnic respiratory failure and associated encephalopathy has resolved although he does continue to refuse BiPAP at times and apparently is noncompliant with this at home. Oxygen-dependent COPD is stable.  He is now agreeable to long-acting insulin.  Main issue at this point is bilateral ankle fractures. The case was discussed with orthopedics in [place] (not felt to be a surgical candidate at this facility). Dr. Rush Farmer advised that no surgery is indicated at this point and may not be necessary in the long term. He recommends discharge with outpatient followup September 1 at which time further evaluation will be made in regard to whether surgery may be indicated.  In the meantime the patient must remain nonweightbearing on both legs which complicates disposition given his morbid obesity and difficulty family would have caring for him at home therefore skilled facility is being pursued. The patient did report agreement to this plan.  Murray Hodgkins, MD [facility] [phone]

## 2014-05-10 NOTE — Clinical Social Work Placement (Signed)
Clinical Social Work Department CLINICAL SOCIAL WORK PLACEMENT NOTE 05/10/2014  Patient:  Rick Cruz, Rick Cruz  Account Number:  [id number] Admit date:  05/08/2014  Clinical Social Worker:  Benay Pike, LCSW  Date/time:  05/10/2014 01:30 PM  Clinical Social Work is seeking post-discharge placement for this patient at the following level of care:   [place]   (*CSW will update this form in Epic as items are completed)   05/10/2014  Patient/family provided with [place] Department of Clinical Social Work's list of facilities offering this level of care within the geographic area requested by the patient (or if unable, by the patient's family).  05/10/2014  Patient/family informed of their freedom to choose among providers that offer the needed level of care, that participate in Medicare, Medicaid or managed care program needed by the patient, have an available bed and are willing to accept the patient.  05/10/2014  Patient/family informed of [facility]' ownership interest in [facility], as well as of the fact that they are under no obligation to receive care at this facility.  PASARR submitted to EDS on 05/10/2014 PASARR number received on 05/10/2014  FL2 transmitted to all facilities in geographic area requested by pt/family on  05/10/2014 FL2 transmitted to all facilities within larger geographic area on   Patient informed that his/her managed care company has contracts with or will negotiate with  certain facilities, including the following:     Patient/family informed of bed offers received:   Patient chooses bed at  Physician recommends and patient chooses bed at    Patient to be transferred to  on   Patient to be transferred to facility by  Patient and family notified of transfer on  Name of family member notified:    The following physician request were entered in Epic:   Additional Comments:  Benay Pike, [place]

## 2014-05-10 NOTE — Progress Notes (Signed)
Patient stated that he was not going to wear BIPAP tonight. Hospital unit still at bedside.

## 2014-05-10 NOTE — Progress Notes (Signed)
Patient voided incontinently large amount of urine at this time.

## 2014-05-10 NOTE — Clinical Social Work Psychosocial (Signed)
Clinical Social Work Department BRIEF PSYCHOSOCIAL ASSESSMENT 05/10/2014  Patient:  Rick Cruz, Rick Cruz     Account Number:  [id number]     Admit date:  05/08/2014  Clinical Social Worker:  Wyatt Haste  Date/Time:  05/10/2014 01:32 PM  Referred by:  Physician  Date Referred:  05/10/2014 Referred for  SNF Placement   Other Referral:   Interview type:  Patient Other interview type:   wife and son    PSYCHOSOCIAL DATA Living Status:  FAMILY Admitted from facility:   Level of care:   Primary support name:  [name] Primary support relationship to patient:  SPOUSE Degree of support available:   supportive    CURRENT CONCERNS Current Concerns  Post-Acute Placement   Other Concerns:    SOCIAL WORK ASSESSMENT / PLAN CSW met with pt, pt's wife, and son at bedside. Pt alert and oriented and reports he lives with his wife and son. They are there with pt most of the time. Pt came to ED after falling on Friday evening. He was experiencing pain and unable to walk. Pt has bilateral ankle fractures. Pt reports he generally does okay at home. However, the last 3 weeks he has had multiple falls, around 8-9. Family states that pt does not get out of bed much. He prefers having everything brought to him and they accommodate most of the time. The only time he ever leaves the house is for doctor's appointments and it has been that way for several years. Pt reports he was diagnosed with schizophrenia when he was 43. He sees Dr. Toy Care in [place]. His last appointment was completed over the phone as it was too difficult for pt to get to her office. Pt referred to CSW for placement. Family does not feel that they can meet pt's needs at home as he is non weight bearing. Pt has follow up with ortho on 9/1. CSW discussed SNF placement with pt and family. They are aware of [facility] authorization process. SNF list provided. Pt and family open to [place] or [place].   Assessment/plan status:   Psychosocial Support/Ongoing Assessment of Needs Other assessment/ plan:   Information/referral to community resources:   SNF list    PATIENT'S/FAMILY'S RESPONSE TO PLAN OF CARE: Pt complaining of pain in leg. Notified RN. Will initiate bed search and [facility] authorization process.       Benay Pike, [place]

## 2014-05-11 LAB — GLUCOSE, CAPILLARY
GLUCOSE-CAPILLARY: 180 mg/dL — AB (ref 70–99)
GLUCOSE-CAPILLARY: 168 mg/dL — AB (ref 70–99)
Glucose-Capillary: 175 mg/dL — ABNORMAL HIGH (ref 70–99)
Glucose-Capillary: 210 mg/dL — ABNORMAL HIGH (ref 70–99)
Glucose-Capillary: 218 mg/dL — ABNORMAL HIGH (ref 70–99)

## 2014-05-11 LAB — CBC
HCT: 33.5 % — ABNORMAL LOW (ref 39.0–52.0)
HEMOGLOBIN: 10 g/dL — AB (ref 13.0–17.0)
MCH: 27.2 pg (ref 26.0–34.0)
MCHC: 29.9 g/dL — AB (ref 30.0–36.0)
MCV: 91.3 fL (ref 78.0–100.0)
Platelets: 163 10*3/uL (ref 150–400)
RBC: 3.67 MIL/uL — ABNORMAL LOW (ref 4.22–5.81)
RDW: 12.7 % (ref 11.5–15.5)
WBC: 8.3 10*3/uL (ref 4.0–10.5)

## 2014-05-11 LAB — BASIC METABOLIC PANEL
Anion gap: 7 (ref 5–15)
BUN: 19 mg/dL (ref 6–23)
CALCIUM: 8.9 mg/dL (ref 8.4–10.5)
CO2: 40 mEq/L (ref 19–32)
CREATININE: 0.85 mg/dL (ref 0.50–1.35)
Chloride: 92 mEq/L — ABNORMAL LOW (ref 96–112)
GFR calc Af Amer: >90 mL/min (ref >90)
GFR, EST NON AFRICAN AMERICAN: 88 mL/min — AB (ref >90)
GLUCOSE: 207 mg/dL — AB (ref 70–99)
Potassium: 4.6 mEq/L (ref 3.7–5.3)
Sodium: 139 mEq/L (ref 137–147)

## 2014-05-11 MED ORDER — ENOXAPARIN SODIUM 40 MG/0.4ML ~~LOC~~ SOLN: 60.0000 mg | SUBCUTANEOUS | Status: DC

## 2014-05-11 MED ORDER — TIOTROPIUM BROMIDE MONOHYDRATE 18 MCG IN CAPS: 18.0000 ug | ORAL_CAPSULE | Freq: Every morning | RESPIRATORY_TRACT | Status: DC

## 2014-05-11 MED ORDER — HYDROCODONE-ACETAMINOPHEN 5-325 MG PO TABS: 1.0000 | ORAL_TABLET | ORAL | Status: DC | PRN

## 2014-05-11 MED ORDER — IPRATROPIUM-ALBUTEROL 0.5-2.5 (3) MG/3ML IN SOLN: 3.0000 mL | Freq: Two times a day (BID) | RESPIRATORY_TRACT | Status: DC

## 2014-05-11 MED ORDER — ALBUTEROL SULFATE (2.5 MG/3ML) 0.083% IN NEBU: 2.5000 mg | INHALATION_SOLUTION | Freq: Four times a day (QID) | RESPIRATORY_TRACT | Status: DC | PRN

## 2014-05-11 MED ORDER — ALBUTEROL SULFATE (2.5 MG/3ML) 0.083% IN NEBU: 2.5000 mg | INHALATION_SOLUTION | RESPIRATORY_TRACT | Status: DC | PRN

## 2014-05-11 MED ORDER — FENTANYL 50 MCG/HR TD PT72: 50.0000 ug | MEDICATED_PATCH | TRANSDERMAL | Status: DC

## 2014-05-11 MED ORDER — PREDNISONE 20 MG PO TABS: 20.0000 mg | ORAL_TABLET | Freq: Every day | ORAL | Status: DC

## 2014-05-11 MED ORDER — INSULIN ASPART 100 UNIT/ML ~~LOC~~ SOLN
4.0000 [IU] | Freq: Three times a day (TID) | SUBCUTANEOUS | Status: DC
  Administered 2014-05-11 (×2): 4 [IU] via SUBCUTANEOUS

## 2014-05-11 MED ORDER — INSULIN GLARGINE 100 UNIT/ML ~~LOC~~ SOLN
22.0000 [IU] | Freq: Every day | SUBCUTANEOUS | Status: DC
  Administered 2014-05-11: 22 [IU] via SUBCUTANEOUS
  Filled 2014-05-11 (×2): qty 0.22

## 2014-05-11 MED ORDER — ALBUTEROL SULFATE HFA 108 (90 BASE) MCG/ACT IN AERS: 2.0000 | INHALATION_SPRAY | Freq: Four times a day (QID) | RESPIRATORY_TRACT | Status: DC | PRN

## 2014-05-11 MED ORDER — CLOPIDOGREL BISULFATE 75 MG PO TABS: 75.0000 mg | ORAL_TABLET | Freq: Every day | ORAL | Status: DC

## 2014-05-11 NOTE — Progress Notes (Signed)
[facility] PROGRESS NOTE  Rick Cruz [id number] DOB: 08-08-1946 DOA: 05/08/2014 PCP: [name], MD  Summary:  68 year old man with multiple medical problems presented with multiple complaints including several falls, and poor balance, increased weakness left arm and leg beginning a few days prior?, mild chest discomfort. Workup revealed bilateral ankle fractures and he was admitted for orthopedic evaluation. Shortly after admission he became obtunded and ABG revealed acute hypercapnic respiratory failure, history of stroke step down unit on BiPAP. He rapidly improved and now appears to be at baseline.   Assessment/Plan: 1. Acute hypercapnic respiratory failure, respiratory acidosis. Secondary to noncompliance with BiPAP as an outpatient complicated by obstructive sleep apnea, oxygen dependent COPD. Chest x-ray unremarkable, no signs or symptoms of infection. CO2 40 this am. Trending downward. Will hold lasix today.  Continues to refuse BiPAP at night.  2. Acute encephalopathy secondary to hypercapnic respiratory failure, resolved. 3. 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 See #1. 4. Possible COPD exacerbation. Appears at baseline. Continue oxygen, nebs and steroids. Will start to taper steroids 5. Bilateral ankle fractures, status post fall at home. Currently left split and Cam Walker on right. Will follow up with Dr. Ninfa Linden orthopedics 05/18/14 for evaluation of need for surgery. Continue non-weightbearing on both legs until follow up with ortho  6. History of multiple falls: etiology uncertain. some concern regarding serum glucose and patients ability to manage insulin pump.  7. Possible left upper and left lower extremity weakness? Documented in EDP HPI, but no focal deficits noted. No evidence to suggest stroke. 8. Urinary retention. Likely secondary to acute illness. Voiding without problem. Urine output  adequate. 250cc post void residual  9. Obstructive sleep apnea, noncompliant with BiPAP at home and refusing here as well. 10. Diabetes mellitus type 2, managed with insulin pump. Pump on hold. Lantus resumed. CBG range 168-210. A1c 6.6. Have increased lantus and added meal coverage. Appetite good 11. Schizophrenia. Appears to be stable.  12. Morbid obesity. BMI 39.4. Nutritional consult.   Code Status: full Family Communication: none present Disposition Plan: facility when bed available   Consultants:  Dr Ninfa Linden ortho (phone consult with OP appointment)  Procedures:  none  Antibiotics:  none  HPI/Subjective: Continues to complain of ankle "soreness"  Objective: Filed Vitals:   05/11/14 0831  BP: 126/65  Pulse: 75  Temp:   Resp:     Intake/Output Summary (Last 24 hours) at 05/11/14 1005 Last data filed at 05/11/14 0553  Gross per 24 hour  Intake    240 ml  Output   1850 ml  Net  -1610 ml   Filed Weights   05/08/14 0505 05/09/14 0500 05/11/14 0554  Weight: 139.2 kg (306 lb 14.1 oz) 133.2 kg (293 lb 10.4 oz) 130.2 kg (287 lb 0.6 oz)    Exam:   General:  Alert obese appears comfortable  Cardiovascular: RRR No MGR   Respiratory: normal effort BS distant but clear no wheeze  Abdomen: obese soft +BS throughout. Non-tender to palpation  Musculoskeletal: right leg with CAM walker and left leg with splint. Bilateral toes warm to touch   Data Reviewed: Basic Metabolic Panel:  Recent Labs Lab 05/08/14 0016 05/08/14 0612 05/11/14 0510  NA 144 145 139  K 5.0 5.1 4.6  CL 96 98 92*  CO2 42* 44* 40*  GLUCOSE 144* 103* 207*  BUN 17 16 19   CREATININE 1.19 1.03 0.85  CALCIUM 8.8 8.7 8.9  Liver Function Tests:  Recent Labs Lab 05/08/14 0016  AST 13  ALT 9  ALKPHOS 79  BILITOT 0.2*  PROT 6.6  ALBUMIN 3.2*   No results found for this basename: LIPASE, AMYLASE,  in the last 168 hours No results found for this basename: AMMONIA,  in the last 168  hours CBC:  Recent Labs Lab 05/08/14 0016 05/08/14 0612 05/11/14 0510  WBC 11.1* 11.5* 8.3  NEUTROABS 9.1*  --   --   HGB 11.2* 10.6* 10.0*  HCT 39.5 37.5* 33.5*  MCV 97.1 96.6 91.3  PLT 206 209 163   Cardiac Enzymes:  Recent Labs Lab 05/08/14 0016 05/08/14 1050 05/08/14 1632 05/08/14 2230  TROPONINI <0.30 <0.30 <0.30 <0.30   BNP (last 3 results)  Recent Labs  01/02/14 0935  PROBNP 229.0*   CBG:  Recent Labs Lab 05/10/14 1624 05/10/14 2112 05/11/14 0044 05/11/14 0405 05/11/14 0727  GLUCAP 279* 192* 210* 180* 168*    Recent Results (from the past 240 hour(s))  CULTURE, BLOOD (ROUTINE X 2)     Status: None   Collection Time    05/08/14 12:23 AM      Result Value Ref Range Status   Specimen Description Blood RIGHT ANTECUBITAL   Final   Special Requests BOTTLES DRAWN AEROBIC AND ANAEROBIC 8CC   Final   Culture NO GROWTH 2 DAYS   Final   Report Status PENDING   Incomplete  CULTURE, BLOOD (ROUTINE X 2)     Status: None   Collection Time    05/08/14 12:31 AM      Result Value Ref Range Status   Specimen Description RIGHT ANTECUBITAL   Final   Special Requests BOTTLES DRAWN AEROBIC AND ANAEROBIC 6CC   Final   Culture NO GROWTH 2 DAYS   Final   Report Status PENDING   Incomplete  MRSA PCR SCREENING     Status: None   Collection Time    05/08/14 10:54 AM      Result Value Ref Range Status   MRSA by PCR NEGATIVE  NEGATIVE Final   Comment:            The GeneXpert MRSA Assay (FDA     approved for NASAL specimens     only), is one component of a     comprehensive MRSA colonization     surveillance program. It is not     intended to diagnose MRSA     infection nor to guide or     monitor treatment for     MRSA infections.     Studies: No results found.  Scheduled Meds: . antiseptic oral rinse  7 mL Mouth Rinse BID  . aspirin  81 mg Oral Daily  . brimonidine  1 drop Both Eyes BID  . clopidogrel  75 mg Oral Daily  . enoxaparin (LOVENOX) injection   40 mg Subcutaneous Q24H  . fentaNYL  50 mcg Transdermal Q72H  . gabapentin  300 mg Oral TID  . insulin aspart  0-15 Units Subcutaneous TID WC  . insulin aspart  0-5 Units Subcutaneous QHS  . insulin aspart  4 Units Subcutaneous TID WC  . insulin glargine  22 Units Subcutaneous Daily  . ipratropium-albuterol  3 mL Nebulization BID  . metoprolol  50 mg Oral BID  . pantoprazole  40 mg Oral BID AC  . predniSONE  40 mg Oral Q breakfast  . simvastatin  40 mg Oral QPM  . sodium chloride  3 mL  Intravenous Q12H  . sodium chloride  3 mL Intravenous Q12H  . terazosin  1 mg Oral QHS  . traZODone  150 mg Oral QHS  . triamcinolone cream  1 application Topical BID   Continuous Infusions:   Principal Problem:   Acute respiratory failure with hypercapnia Active Problems:   OBSTRUCTIVE SLEEP APNEA   HYPERTENSION   COPD exacerbation   Paranoid schizophrenia   CAD (coronary artery disease)   Chronic respiratory failure 02 dep with hypercarbia   Recurrent falls   Ankle fracture, right   Ankle fracture, left    Time spent: 35 minutes    [facility] Pager [phone]. If 7PM-7AM, please contact night-coverage at [email or web address], password [facility] 05/11/2014, 10:05 AM  LOS: 3 days

## 2014-05-11 NOTE — Progress Notes (Signed)
Patient seen and examined independently. Agree with assessment and plan outlined below. Plan on discharge to skilled nursing facility this afternoon

## 2014-05-11 NOTE — Discharge Summary (Signed)
Patient seen and examined independently. In brief 68 year old morbidly obese male with COPD on home oxygen, OSA non-compliant with BiPAP, living a sedentary lifestyle at home presented after a fall and found to have nondisplaced fracture of bilateral ankles. She also found to have acute hypercapnic respiratory failure with acute encephalopathy secondary to hypercapnia likely in the setting of noncompliance to BiPAP.  pt seen by orthopedic surgery and recommended bilateral Cam walkers and non-weight bearing . Pt will be seen by Dr Ninfa Linden on 05/18/2014.  -Will prescribe when necessary Vicodin and recommended ongoing physical therapy at skilled nursing facility. Ordered daily subcutaneous Lovenox for DVT prophylaxis due to his nonweightbearing status. -Patient encouraged on using nightly BiPAP. Lasix has been held due to an elevated CO2 of 40 today. Labs need to be checked in 2-3 days and lasix can be resumed if co2 improved. -Patient clinically stable to be discharged to skilled nursing facility.

## 2014-05-11 NOTE — Discharge Summary (Signed)
Physician Discharge Summary  Rick Cruz [id number] DOB: 12-23-1945 DOA: 05/08/2014  PCP: [name], MD  Admit date: 05/08/2014 Discharge date: 05/11/2014  Time spent: 40  minutes  Recommendations for Outpatient Follow-up:  1. Follow up with dr Ninfa Linden orthopedics as scheduled 05/18/14 for evaluation of bilateral ankle fractures and need for surgery. Continue lovenox while non-weight bearing and/or surgery scheduled.  2. SNF [facility]. 3. Recommend BMET 05/14/14 for evaluation of CO2 and possible resumption of lasix.  Discharge Diagnoses:  Principal Problem:   Acute respiratory failure with hypercapnia Active Problems:   OBSTRUCTIVE SLEEP APNEA   HYPERTENSION   COPD exacerbation   Paranoid schizophrenia   CAD (coronary artery disease)   Chronic respiratory failure 02 dep with hypercarbia   Recurrent falls   Ankle fracture, right   Ankle fracture, left   Discharge Condition: stable  Diet recommendation: heart healthy carb modified  Filed Weights   05/08/14 0505 05/09/14 0500 05/11/14 0554  Weight: 139.2 kg (306 lb 14.1 oz) 133.2 kg (293 lb 10.4 oz) 130.2 kg (287 lb 0.6 oz)    History of present illness:  68 yo male with h/o osa noncompliant with cpap/bipap, copd, morbid obesity, schizophrenia, crf on oxygen 3 L at home, very deconditioned comes in to ED on 05/08/14 after experiencing increased frequency of falls at home for several weeks. Pt was hospitalized in April for encephalopathy sounded like was due to use of his sedatives at home, was recommended to go to rehab then but he refused. Per report by edp wife said he minimally gets up to walk anymore. And when he does, he falls. On 05/08/14 he fell, and was complaining of ankle pain. Pt frequently refused to come to the ED despite family members trying to get him to seek medical attention. He agreed to come to ED on 05/08/14 because he hurt his ankles, and was found to have fractures in both ankles. Pt denied any fevers. No  n/v/d. No sob. No cp.   Hospital Course:  1. Acute hypercapnic respiratory failure, respiratory acidosis. Secondary to noncompliance with BiPAP as an outpatient complicated by obstructive sleep apnea, oxygen dependent COPD. Chest x-ray unremarkable, no signs or symptoms of infection. CO2 40 at discharge which is a little above his baseline. Recommend BMET on 8/28 for evaluation of CO2 and resumption of lasix as indicated. Continued to refuse BiPAP at night during this hospitalization. 2. Acute encephalopathy secondary to hypercapnic respiratory failure, resolved at discharge. 3. 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  See #1. 4. Possible COPD exacerbation. Remained at baseline. Provided with  oxygen, nebs and steroids. Will discharge with quick taper.  5.  Bilateral ankle fractures, status post fall at home. Currently left split and Cam Walker on right. Will follow up with Dr. Ninfa Linden orthopedics 05/18/14 for evaluation of need for surgery. Continue non-weightbearing on both legs until follow up with ortho. Daily lovenox.  6. History of multiple falls: etiology uncertain. some concern regarding serum glucose and patients ability to manage insulin pump. Will discharge on insulin 14units TID.  7. Possible left upper and left lower extremity weakness? Documented in EDP HPI, but no focal deficits noted. No evidence to suggest stroke. 8. Urinary retention. Likely secondary to acute illness. Voiding without problem. Urine output adequate. 250cc post void residual  9. Obstructive sleep apnea, noncompliant with BiPAP at home and refusing here as well. 10. Diabetes mellitus type 2, managed with insulin pump prior to  admission. Pump discontinued.  A1c 6.6.  Appetite good 11. Schizophrenia. Remained stable. .  12. Morbid obesity. BMI 39.4. Nutritional consult.   Procedures:  None  Consultations:  Dr Ninfa Linden orthopedics  Discharge  Exam: Filed Vitals:   05/11/14 0831  BP: 126/65  Pulse: 75  Temp:   Resp:     General: obese appears comfortable Cardiovascular: RRR No MGR No LE edema (toes visible bilaterally) Respiratory: normal effort BS somewhat distant but clear MS: right leg with CAM walker and left leg with splint. Toes warm to touch bilaterally. Sensation intact. No erythema or swelling.   Discharge Instructions You were cared for by a hospitalist during your hospital stay. If you have any questions about your discharge medications or the care you received while you were in the hospital after you are discharged, you can call the unit and asked to speak with the hospitalist on call if the hospitalist that took care of you is not available. Once you are discharged, your primary care physician will handle any further medical issues. Please note that NO REFILLS for any discharge medications will be authorized once you are discharged, as it is imperative that you return to your primary care physician (or establish a relationship with a primary care physician if you do not have one) for your aftercare needs so that they can reassess your need for medications and monitor your lab values.     Medication List    STOP taking these medications       furosemide 20 MG tablet  Commonly known as:  LASIX      TAKE these medications       albuterol 108 (90 BASE) MCG/ACT inhaler  Commonly known as:  PROVENTIL HFA;VENTOLIN HFA  Inhale 2 puffs into the lungs every 6 (six) hours as needed for wheezing or shortness of breath.     albuterol (2.5 MG/3ML) 0.083% nebulizer solution  Commonly known as:  PROVENTIL  Take 3 mLs (2.5 mg total) by nebulization every 6 (six) hours as needed for wheezing or shortness of breath.     aspirin 81 MG chewable tablet  Chew 81 mg by mouth daily.     brimonidine 0.15 % ophthalmic solution  Commonly known as:  ALPHAGAN  Place 1 drop into both eyes 2 (two) times daily.     Cinnamon 500 MG  capsule  Take 500 mg by mouth daily.     clopidogrel 75 MG tablet  Commonly known as:  PLAVIX  Take 75 mg by mouth daily.     enoxaparin 40 MG/0.4ML injection  Commonly known as:  LOVENOX  Inject 0.6 mLs (60 mg total) into the skin daily.     fentaNYL 50 MCG/HR  Commonly known as:  DURAGESIC - dosed mcg/hr  Place 50 mcg onto the skin every 3 (three) days.     gabapentin 300 MG capsule  Commonly known as:  NEURONTIN  Take 300 mg by mouth 3 (three) times daily.     HYDROcodone-acetaminophen 5-325 MG per tablet  Commonly known as:  NORCO/VICODIN  Take 1-2 tablets by mouth every 4 (four) hours as needed for moderate pain.     insulin lispro 100 UNIT/ML injection  Commonly known as:  HUMALOG  Inject 14 Units into the skin 3 (three) times daily.     metoprolol 50 MG tablet  Commonly known as:  LOPRESSOR  Take 50 mg by mouth 2 (two) times daily.     pantoprazole 40 MG tablet  Commonly known  as:  PROTONIX  Take 30- 60 min before your first and last meals of the day     predniSONE 20 MG tablet  Commonly known as:  DELTASONE  Take 1 tablet (20 mg total) by mouth daily with breakfast.  Start taking on:  05/12/2014     simvastatin 40 MG tablet  Commonly known as:  ZOCOR  Take 40 mg by mouth every evening.     terazosin 1 MG capsule  Commonly known as:  HYTRIN  Take 1 mg by mouth at bedtime.     tiotropium 18 MCG inhalation capsule  Commonly known as:  SPIRIVA HANDIHALER  Place 1 capsule (18 mcg total) into inhaler and inhale every morning.     traZODone 150 MG tablet  Commonly known as:  DESYREL  Take 1 tablet (150 mg total) by mouth at bedtime.     triamcinolone cream 0.1 %  Commonly known as:  KENALOG  Apply 1 application topically 2 (two) times daily.       Allergies  Allergen Reactions  . Phenergan [Promethazine Hcl] Other (See Comments)    Becomes very confused and aggressive   Follow-up Information   Follow up with Mcarthur Rossetti, MD On 05/18/2014.  (appointment at 9:30. please come few minutes early and bring picture ID an insurance. )    Specialty:  Orthopedic Surgery   Contact information:   [address] [phone]        The results of significant diagnostics from this hospitalization (including imaging, microbiology, ancillary and laboratory) are listed below for reference.    Significant Diagnostic Studies: Dg Chest 1 View  05/08/2014   CLINICAL DATA:  Shortness of breath  EXAM: CHEST - 1 VIEW  COMPARISON:  01/09/2014  FINDINGS: Chronic cardiomegaly. Unchanged upper mediastinal contours. There is chronic pulmonary hyperinflation and interstitial coarsening. Prominent fat pad at the medial right base. Chronic bilateral extrapleural thickening. There is no edema, consolidation, effusion, or pneumothorax. Posttraumatic deformity of the lower right ribs.  IMPRESSION: COPD and cardiomegaly.  No acute superimposed findings.   Electronically Signed   By: Jorje Guild M.D.   On: 05/08/2014 02:01   Dg Ankle Complete Left  05/08/2014   CLINICAL DATA:  Ankle pain after fall.  EXAM: LEFT ANKLE COMPLETE - 3+ VIEW  COMPARISON:  None.  FINDINGS: There is an oblique, nondisplaced distal fibula fracture, at the level of the ankle joint. Medial malleolus avulsion fracture, presumably acute given the clinical circumstances. There is soft tissue swelling about the ankle. No malalignment. No hindfoot fracture.  IMPRESSION: Nondisplaced medial malleolus and distal fibula fractures.   Electronically Signed   By: Jorje Guild M.D.   On: 05/08/2014 02:58   Dg Ankle Complete Right  05/08/2014   CLINICAL DATA:  68 year old male status post fall with pain. Initial encounter.  EXAM: RIGHT ANKLE - COMPLETE 3+ VIEW  COMPARISON:  None.  FINDINGS: Oblique fracture distal right fibula meta diaphysis with mild lateral displacement and posterior angulation of the distal fragment. Mild lateral subluxation of the mortise joint. Talar  dome intact. Distal tibia appears intact. Calcaneus intact.  IMPRESSION: Oblique fracture distal right fibula with mild lateral displacement and lateral subluxation of the mortise joint.   Electronically Signed   By: Lars Pinks M.D.   On: 05/08/2014 02:52   Ct Head Wo Contrast  05/08/2014   CLINICAL DATA:  Left-sided weakness and headache.  EXAM: CT HEAD WITHOUT CONTRAST  TECHNIQUE: Contiguous axial images were  obtained from the base of the skull through the vertex without intravenous contrast.  COMPARISON:  01/02/2014  FINDINGS: Skull and Sinuses:Negative for fracture or destructive process. The mastoids, middle ears, and imaged paranasal sinuses are clear.  Orbits: Bilateral cataract resection.  Brain: Diagnostic sensitivity decreased by motion, present despite repeated imaging. This could obscure small or subtle abnormalities. No evidence of acute abnormality, such as acute infarction, hemorrhage, hydrocephalus, or mass lesion/mass effect. There is generalized brain atrophy, advanced for age. Confluent periventricular white matter low density consistent with chronic small vessel disease - pattern stable from previous.  IMPRESSION: 1. As permitted by patient motion, no acute intracranial findings. 2. 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   Electronically Signed   By: Jorje Guild M.D.   On: 05/08/2014 01:53    Microbiology: Recent Results (from the past 240 hour(s))  CULTURE, BLOOD (ROUTINE X 2)     Status: None   Collection Time    05/08/14 12:23 AM      Result Value Ref Range Status   Specimen Description BLOOD RIGHT ANTECUBITAL   Final   Special Requests BOTTLES DRAWN AEROBIC AND ANAEROBIC 8CC   Final   Culture NO GROWTH 3 DAYS   Final   Report Status PENDING   Incomplete  CULTURE, BLOOD (ROUTINE X 2)     Status: None   Collection Time    05/08/14 12:31 AM      Result Value Ref Range Status   Specimen Description BLOOD RIGHT ANTECUBITAL   Final   Special Requests BOTTLES DRAWN  AEROBIC AND ANAEROBIC 6CC   Final   Culture NO GROWTH 3 DAYS   Final   Report Status PENDING   Incomplete  MRSA PCR SCREENING     Status: None   Collection Time    05/08/14 10:54 AM      Result Value Ref Range Status   MRSA by PCR NEGATIVE  NEGATIVE Final   Comment:            The GeneXpert MRSA Assay (FDA     approved for NASAL specimens     only), is one component of a     comprehensive MRSA colonization     surveillance program. It is not     intended to diagnose MRSA     infection nor to guide or     monitor treatment for     MRSA infections.     Labs: Basic Metabolic Panel:  Recent Labs Lab 05/08/14 0016 05/08/14 0612 05/11/14 0510  NA 144 145 139  K 5.0 5.1 4.6  CL 96 98 92*  CO2 42* 44* 40*  GLUCOSE 144* 103* 207*  BUN 17 16 19   CREATININE 1.19 1.03 0.85  CALCIUM 8.8 8.7 8.9   Liver Function Tests:  Recent Labs Lab 05/08/14 0016  AST 13  ALT 9  ALKPHOS 79  BILITOT 0.2*  PROT 6.6  ALBUMIN 3.2*   No results found for this basename: LIPASE, AMYLASE,  in the last 168 hours No results found for this basename: AMMONIA,  in the last 168 hours CBC:  Recent Labs Lab 05/08/14 0016 05/08/14 0612 05/11/14 0510  WBC 11.1* 11.5* 8.3  NEUTROABS 9.1*  --   --   HGB 11.2* 10.6* 10.0*  HCT 39.5 37.5* 33.5*  MCV 97.1 96.6 91.3  PLT 206 209 163   Cardiac Enzymes:  Recent Labs Lab 05/08/14 0016 05/08/14 1050 05/08/14 1632 05/08/14 2230  TROPONINI <0.30 <0.30 <0.30 <0.30  BNP: BNP (last 3 results)  Recent Labs  01/02/14 0935  PROBNP 229.0*   CBG:  Recent Labs Lab 05/11/14 0044 05/11/14 0405 05/11/14 0727 05/11/14 1005 05/11/14 1113  GLUCAP 210* 180* 168* 218* 175*       Signed:  BLACK,KAREN M  [facility] 05/11/2014, 1:36 PM

## 2014-05-11 NOTE — Progress Notes (Signed)
Inpatient Diabetes Program Recommendations  AACE/ADA: New Consensus Statement on Inpatient Glycemic Control (2013)  Target Ranges:  Prepandial:   less than 140 mg/dL      Peak postprandial:   less than 180 mg/dL (1-2 hours)      Critically ill patients:  140 - 180 mg/dL   Results for MESSIAH, LOZINSKI (MRN [id number]) as of 05/11/2014 07:12  Ref. Range 05/10/2014 07:59 05/10/2014 12:01 05/10/2014 16:24 05/10/2014 21:12 05/11/2014 00:44 05/11/2014 04:05  Glucose-Capillary Latest Range: 70-99 mg/dL 204 (H) 220 (H) 279 (H) 192 (H) 210 (H) 180 (H)   Diabetes history: DM2  Outpatient Diabetes medications: Medtronic insulin pump with Humalog  Current orders for Inpatient glycemic control: Novolog 0-15 units AC, Novolog 0-5 units HS, Lantus 20 units daily   Inpatient Diabetes Program Recommendations Insulin - Basal: Please consider increasing Lantus to 22 units daily. Insulin - Meal Coverage: Please consider ordering Novolog 4 units TID with meals for meal coverage.  Thanks, Barnie Alderman, RN, MSN, CCRN Diabetes Coordinator Inpatient Diabetes Program [phone] (Team Pager) [phone] ([place] office) [phone] [facility] office)

## 2014-05-11 NOTE — Evaluation (Signed)
Physical Therapy Evaluation Patient Details Name: Rick Cruz MRN: [id number] DOB: Apr 20, 1946 Today's Date: 05/11/2014   History of Present Illness  68 yo male with h/o osa noncompliant with cpap/bipap, copd, morbid obesity, schizophrenia, crf on oxygen 3 L at home, very deconditioned comes in after experienced increase frequency of falls at home for several weeks.  Pt was hospitalized here in April for encephalopathy sounds like was due to use of his sedatives at home, was recommended to go to rehab then but he refused.  Per report by edp wife says he minimally gets up to walk anymore.  And when he does, he falls.  Today he fell, and was complaining of ankle pain.  Pt frequently refuses to come to the ED despite family members trying to get him to seek medical attention.  Today he agreed to come to ED because he hurt his ankles, and is found to have fractures in both ankles.   Pt denies any fevers.  No n/v/d.  No sob.  No cp.  IMPRESSION: Oblique fracture distal right fibula with mild lateral displacement and lateral subluxation of the mortise joint.  IMPRESSION: Nondisplaced medial malleolus and distal fibula fractures. Per orthopedics, pt placed in CAM walker Rt and splint on Lt with orders for NWB (B) LE for 12 weeks.    Clinical Impression  Pt is a 68 year old male who presents to physical therapy with fractures in (B) ankles and orders for NWB per orthopedics.  Pt currently in a CAM walker on the Rt foot and splint on the Lt foot.  Reviewed NWB restrictions with pt, and educated pt on techniques for slideboard transfers.  Pt able to complete bed mobility skills min assist/min guard to sit at EOB.  Slideboard placed, and pt re-educated on technique to complete transfers.  Nurse tech in room to assist with transfer as needed.  Despite max assist and max VC for technique, pt unable to complete slide board transfer to chair.  Pt was able to scoot to the Rt in bed, though max VC throughout to avoid WB  through (B) LE, and using upper body to clear seat to move up in bed.  Recommend continued PT while in the hospital to address strengthening, activity tolerance for improved functional mobility skills.  Pt will need short term placement to work on side board transfers for safe return home to complete mobility skills.      Follow Up Recommendations SNF    Equipment Recommendations  Other (comment) (Will defer to SNF)       Precautions / Restrictions Precautions Precautions: Fall Precaution Comments: CAM walker Rt foot, Splint Lt foot Restrictions Weight Bearing Restrictions: Yes RLE Weight Bearing: Non weight bearing LLE Weight Bearing: Non weight bearing      Mobility  Bed Mobility Overal bed mobility: Needs Assistance Bed Mobility: Supine to Sit;Sit to Supine;Rolling Rolling: Modified independent (Device/Increase time) (with handrail)   Supine to sit: HOB elevated;Modified independent (Device/Increase time) Sit to supine: HOB elevated;Min assist (for LE)      Transfers                 General transfer comment: Attempted slideboard transfer though pt unable to complete. Educated pt on technique to perform transfer, maintain NWB restrictions on LE, though pt unable to clear seat enought to slide onto slideboard.  Pt was able to  complete scooting a little bit to [facility], though VC throughout to maintain NWB restrictions.   Ambulation/Gait  General Gait Details: Pt currently NWB per MD orders.       Balance Overall balance assessment: History of Falls;Needs assistance Sitting-balance support: Bilateral upper extremity supported;Feet unsupported Sitting balance-Leahy Scale: Good         Standing balance comment: Not tested as pt is currently NWB per MD.                              Pertinent Vitals/Pain Pain Assessment: No/denies pain    Home Living Family/patient expects to be discharged to:: Skilled nursing facility Living  Arrangements: Spouse/significant other;Children               Additional Comments: Pt reports he lives with his wife and son in a single home with 4-5 steps to enter.  DME : quad cane, std walker, lift chair, handicap toilet    Prior Function Level of Independence: Needs assistance   Gait / Transfers Assistance Needed: ambulates short distances with a cane, sits/sleeps in a lift chair  ADL's / Homemaking Assistance Needed: wife assists with a sponge bath        Hand Dominance        Extremity/Trunk Assessment               Lower Extremity Assessment: Generalized weakness;RLE deficits/detail;LLE deficits/detail RLE Deficits / Details: CAM walker on Rt - NWB orders LLE Deficits / Details: Splint on Lt -  NWB orders     Communication   Communication: No difficulties  Cognition Arousal/Alertness: Awake/alert Behavior During Therapy: WFL for tasks assessed/performed Overall Cognitive Status: No family/caregiver present to determine baseline cognitive functioning                        Assessment/Plan    PT Assessment Patient needs continued PT services  PT Diagnosis Generalized weakness;Other (comment) (Ankle fractures, MD orders for NWB)   PT Problem List Decreased strength;Decreased knowledge of use of DME;Decreased activity tolerance;Decreased safety awareness;Decreased mobility  PT Treatment Interventions DME instruction;Balance training;Gait training;Functional mobility training;Therapeutic activities;Therapeutic exercise;Wheelchair mobility training;Patient/family education;Neuromuscular re-education   PT Goals (Current goals can be found in the Care Plan section) Acute Rehab PT Goals Patient Stated Goal: none stated    Frequency Min 3X/week   Barriers to discharge Inaccessible home environment;Decreased caregiver support Pt is currently NWB and will be unable to ascend/descend stairs to enter home or perform slideboard transfers at this time to  complete functional mobility skills.        End of Session Equipment Utilized During Treatment: Oxygen Activity Tolerance: Patient limited by fatigue Patient left: in bed;with call bell/phone within reach;with bed alarm set Nurse Communication: Mobility status         Time: [id number] PT Time Calculation (min): 24 min   Charges:   PT Evaluation $Initial PT Evaluation Tier I: 1 Procedure     Ciclaly Mulcahey 05/11/2014, 11:51 AM

## 2014-05-11 NOTE — Clinical Social Work Placement (Signed)
Clinical Social Work Department CLINICAL SOCIAL WORK PLACEMENT NOTE 05/11/2014  Patient:  UZZIAH, HUSER  Account Number:  [id number] Admit date:  05/08/2014  Clinical Social Worker:  Benay Pike, LCSW  Date/time:  05/10/2014 01:30 PM  Clinical Social Work is seeking post-discharge placement for this patient at the following level of care:   [place]   (*CSW will update this form in Epic as items are completed)   05/10/2014  Patient/family provided with [facility] Department of Clinical Social Work's list of facilities offering this level of care within the geographic area requested by the patient (or if unable, by the patient's family).  05/10/2014  Patient/family informed of their freedom to choose among providers that offer the needed level of care, that participate in Medicare, Medicaid or managed care program needed by the patient, have an available bed and are willing to accept the patient.  05/10/2014  Patient/family informed of [facility]' ownership interest in [facility], as well as of the fact that they are under no obligation to receive care at this facility.  PASARR submitted to EDS on 05/10/2014 PASARR number received on 05/10/2014  FL2 transmitted to all facilities in geographic area requested by pt/family on  05/10/2014 FL2 transmitted to all facilities within larger geographic area on   Patient informed that his/her managed care company has contracts with or will negotiate with  certain facilities, including the following:     Patient/family informed of bed offers received:  05/11/2014 Patient chooses bed at [facility] Physician recommends and patient chooses bed at  [facility]  Patient to be transferred to [facility] on  05/11/2014 Patient to be transferred to facility by [facility] Patient and family notified of transfer on 05/11/2014 Name of family member notified:  [name]- wife  The following physician  request were entered in Epic:   Additional Comments:  Benay Pike, [place]

## 2014-05-11 NOTE — Clinical Social Work Note (Signed)
CSW spoke with [facility] Medicare this morning and PT evaluation was requested to attempt sliding board transfers. CSW faxed evaluated to [facility] and spoke to [place] notifying her that notes were sent.   Rick Cruz, [place]

## 2014-05-11 NOTE — Progress Notes (Signed)
IV removed. Report called to Rhunette Croft at [facility]

## 2014-05-11 NOTE — Clinical Social Work Note (Addendum)
[employer] called stating pt has been approved for SNF. CSW provided bed offers to pt and pt's wife and they accept [facility]. Facility notified. Pt to d/c this afternoon and will transport via [facility]. CSW requested authorization for EMS transport and case manager there said if no call received prior to d/c, authorization can be completed after. D/C summary to be faxed upon completion.   Benay Pike, [place]

## 2014-05-13 LAB — CULTURE, BLOOD (ROUTINE X 2)
CULTURE: NO GROWTH
Culture: NO GROWTH

## 2014-06-09 ENCOUNTER — Encounter (INDEPENDENT_AMBULATORY_CARE_PROVIDER_SITE_OTHER): Payer: Medicare Other | Admitting: Ophthalmology

## 2014-06-09 DIAGNOSIS — I10 Essential (primary) hypertension: Secondary | ICD-10-CM

## 2014-06-09 DIAGNOSIS — H43819 Vitreous degeneration, unspecified eye: Secondary | ICD-10-CM

## 2014-06-09 DIAGNOSIS — H34219 Partial retinal artery occlusion, unspecified eye: Secondary | ICD-10-CM

## 2014-06-09 DIAGNOSIS — H35039 Hypertensive retinopathy, unspecified eye: Secondary | ICD-10-CM

## 2014-06-09 DIAGNOSIS — H34239 Retinal artery branch occlusion, unspecified eye: Secondary | ICD-10-CM

## 2014-07-08 ENCOUNTER — Other Ambulatory Visit (HOSPITAL_COMMUNITY): Payer: Self-pay | Admitting: Respiratory Therapy

## 2014-07-08 DIAGNOSIS — G4730 Sleep apnea, unspecified: Secondary | ICD-10-CM

## 2014-07-14 ENCOUNTER — Encounter (HOSPITAL_COMMUNITY): Payer: Self-pay | Admitting: Emergency Medicine

## 2014-07-14 ENCOUNTER — Inpatient Hospital Stay (HOSPITAL_COMMUNITY)
Admission: EM | Admit: 2014-07-14 | Discharge: 2014-07-17 | DRG: 189 | Disposition: A | Discharge status: Home Health | Payer: Medicare Other | Attending: Family Medicine | Admitting: Family Medicine

## 2014-07-14 ENCOUNTER — Emergency Department (HOSPITAL_COMMUNITY): Payer: Medicare Other

## 2014-07-14 DIAGNOSIS — Z794 Long term (current) use of insulin: Secondary | ICD-10-CM | POA: Diagnosis not present

## 2014-07-14 DIAGNOSIS — F209 Schizophrenia, unspecified: Secondary | ICD-10-CM | POA: Diagnosis present

## 2014-07-14 DIAGNOSIS — E875 Hyperkalemia: Secondary | ICD-10-CM | POA: Diagnosis present

## 2014-07-14 DIAGNOSIS — Z9119 Patient's noncompliance with other medical treatment and regimen: Secondary | ICD-10-CM | POA: Diagnosis present

## 2014-07-14 DIAGNOSIS — Z87891 Personal history of nicotine dependence: Secondary | ICD-10-CM

## 2014-07-14 DIAGNOSIS — IMO0002 Reserved for concepts with insufficient information to code with codable children: Secondary | ICD-10-CM

## 2014-07-14 DIAGNOSIS — N400 Enlarged prostate without lower urinary tract symptoms: Secondary | ICD-10-CM | POA: Diagnosis present

## 2014-07-14 DIAGNOSIS — F200 Paranoid schizophrenia: Secondary | ICD-10-CM

## 2014-07-14 DIAGNOSIS — E785 Hyperlipidemia, unspecified: Secondary | ICD-10-CM | POA: Diagnosis present

## 2014-07-14 DIAGNOSIS — Z7952 Long term (current) use of systemic steroids: Secondary | ICD-10-CM | POA: Diagnosis not present

## 2014-07-14 DIAGNOSIS — R21 Rash and other nonspecific skin eruption: Secondary | ICD-10-CM

## 2014-07-14 DIAGNOSIS — J9601 Acute respiratory failure with hypoxia: Secondary | ICD-10-CM | POA: Diagnosis present

## 2014-07-14 DIAGNOSIS — I5033 Acute on chronic diastolic (congestive) heart failure: Secondary | ICD-10-CM | POA: Diagnosis present

## 2014-07-14 DIAGNOSIS — G4733 Obstructive sleep apnea (adult) (pediatric): Secondary | ICD-10-CM

## 2014-07-14 DIAGNOSIS — I10 Essential (primary) hypertension: Secondary | ICD-10-CM

## 2014-07-14 DIAGNOSIS — R0602 Shortness of breath: Secondary | ICD-10-CM | POA: Diagnosis not present

## 2014-07-14 DIAGNOSIS — E780 Pure hypercholesterolemia: Secondary | ICD-10-CM | POA: Diagnosis present

## 2014-07-14 DIAGNOSIS — Z7982 Long term (current) use of aspirin: Secondary | ICD-10-CM

## 2014-07-14 DIAGNOSIS — E119 Type 2 diabetes mellitus without complications: Secondary | ICD-10-CM | POA: Diagnosis present

## 2014-07-14 DIAGNOSIS — J441 Chronic obstructive pulmonary disease with (acute) exacerbation: Secondary | ICD-10-CM | POA: Diagnosis present

## 2014-07-14 DIAGNOSIS — I2510 Atherosclerotic heart disease of native coronary artery without angina pectoris: Secondary | ICD-10-CM | POA: Diagnosis present

## 2014-07-14 DIAGNOSIS — T40605A Adverse effect of unspecified narcotics, initial encounter: Secondary | ICD-10-CM | POA: Diagnosis present

## 2014-07-14 DIAGNOSIS — Z9641 Presence of insulin pump (external) (internal): Secondary | ICD-10-CM | POA: Diagnosis present

## 2014-07-14 DIAGNOSIS — G894 Chronic pain syndrome: Secondary | ICD-10-CM | POA: Diagnosis present

## 2014-07-14 DIAGNOSIS — E1165 Type 2 diabetes mellitus with hyperglycemia: Secondary | ICD-10-CM

## 2014-07-14 DIAGNOSIS — Z9981 Dependence on supplemental oxygen: Secondary | ICD-10-CM | POA: Diagnosis not present

## 2014-07-14 DIAGNOSIS — J449 Chronic obstructive pulmonary disease, unspecified: Secondary | ICD-10-CM | POA: Diagnosis present

## 2014-07-14 DIAGNOSIS — Z79899 Other long term (current) drug therapy: Secondary | ICD-10-CM

## 2014-07-14 LAB — COMPREHENSIVE METABOLIC PANEL
ALT: 9 U/L (ref 0–53)
AST: 9 U/L (ref 0–37)
Albumin: 3.3 g/dL — ABNORMAL LOW (ref 3.5–5.2)
Alkaline Phosphatase: 103 U/L (ref 39–117)
Anion gap: 8 (ref 5–15)
BILIRUBIN TOTAL: 0.3 mg/dL (ref 0.3–1.2)
BUN: 10 mg/dL (ref 6–23)
CHLORIDE: 91 meq/L — AB (ref 96–112)
CO2: 40 meq/L — AB (ref 19–32)
Calcium: 9 mg/dL (ref 8.4–10.5)
Creatinine, Ser: 1.02 mg/dL (ref 0.50–1.35)
GFR, EST AFRICAN AMERICAN: 86 mL/min — AB (ref >90)
GFR, EST NON AFRICAN AMERICAN: 74 mL/min — AB (ref >90)
Glucose, Bld: 349 mg/dL — ABNORMAL HIGH (ref 70–99)
POTASSIUM: 5.4 meq/L — AB (ref 3.7–5.3)
SODIUM: 139 meq/L (ref 137–147)
Total Protein: 6.5 g/dL (ref 6.0–8.3)

## 2014-07-14 LAB — CBC WITH DIFFERENTIAL/PLATELET
Basophils Absolute: 0 10*3/uL (ref 0.0–0.1)
Basophils Relative: 0 % (ref 0–1)
Eosinophils Absolute: 0.2 10*3/uL (ref 0.0–0.7)
Eosinophils Relative: 2 % (ref 0–5)
HCT: 37.5 % — ABNORMAL LOW (ref 39.0–52.0)
Hemoglobin: 10.5 g/dL — ABNORMAL LOW (ref 13.0–17.0)
LYMPHS ABS: 1.2 10*3/uL (ref 0.7–4.0)
LYMPHS PCT: 10 % — AB (ref 12–46)
MCH: 26.6 pg (ref 26.0–34.0)
MCHC: 28 g/dL — ABNORMAL LOW (ref 30.0–36.0)
MCV: 95.2 fL (ref 78.0–100.0)
Monocytes Absolute: 0.6 10*3/uL (ref 0.1–1.0)
Monocytes Relative: 5 % (ref 3–12)
Neutro Abs: 10.2 10*3/uL — ABNORMAL HIGH (ref 1.7–7.7)
Neutrophils Relative %: 83 % — ABNORMAL HIGH (ref 43–77)
PLATELETS: 246 10*3/uL (ref 150–400)
RBC: 3.94 MIL/uL — AB (ref 4.22–5.81)
RDW: 13.9 % (ref 11.5–15.5)
WBC: 12.2 10*3/uL — AB (ref 4.0–10.5)

## 2014-07-14 LAB — BLOOD GAS, ARTERIAL
Bicarbonate: 40.8 mEq/L — ABNORMAL HIGH (ref 20.0–24.0)
Drawn by: 38235
O2 Content: 4.5 L/min
O2 SAT: 89.8 %
PCO2 ART: 72.4 mmHg — AB (ref 35.0–45.0)
PO2 ART: 62.3 mmHg — AB (ref 80.0–100.0)
TCO2: 38.2 mmol/L (ref 0–100)
pH, Arterial: 7.37 (ref 7.350–7.450)

## 2014-07-14 LAB — MRSA PCR SCREENING: MRSA by PCR: NEGATIVE

## 2014-07-14 LAB — TROPONIN I: Troponin I: <0.3 ng/mL (ref <0.30)

## 2014-07-14 LAB — GLUCOSE, CAPILLARY: Glucose-Capillary: 304 mg/dL — ABNORMAL HIGH (ref 70–99)

## 2014-07-14 LAB — CBG MONITORING, ED: Glucose-Capillary: 290 mg/dL — ABNORMAL HIGH (ref 70–99)

## 2014-07-14 LAB — PRO B NATRIURETIC PEPTIDE: Pro B Natriuretic peptide (BNP): 211.1 pg/mL — ABNORMAL HIGH (ref 0–125)

## 2014-07-14 MED ORDER — METOPROLOL TARTRATE 50 MG PO TABS
50.0000 mg | ORAL_TABLET | Freq: Two times a day (BID) | ORAL | Status: DC
  Administered 2014-07-14 – 2014-07-17 (×6): 50 mg via ORAL
  Filled 2014-07-14 (×6): qty 1

## 2014-07-14 MED ORDER — ONDANSETRON HCL 4 MG PO TABS: 4.0000 mg | ORAL_TABLET | Freq: Four times a day (QID) | ORAL | Status: DC | PRN

## 2014-07-14 MED ORDER — NYSTATIN-TRIAMCINOLONE 100000-0.1 UNIT/GM-% EX CREA
TOPICAL_CREAM | Freq: Two times a day (BID) | CUTANEOUS | Status: DC
  Administered 2014-07-14 – 2014-07-15 (×2): 1 via TOPICAL
  Administered 2014-07-15 – 2014-07-16 (×2): via TOPICAL
  Administered 2014-07-16: 1 via TOPICAL
  Administered 2014-07-17: 09:00:00 via TOPICAL
  Filled 2014-07-14 (×2): qty 15

## 2014-07-14 MED ORDER — TRAZODONE HCL 50 MG PO TABS
300.0000 mg | ORAL_TABLET | Freq: Every day | ORAL | Status: DC
  Administered 2014-07-14 – 2014-07-16 (×3): 300 mg via ORAL
  Filled 2014-07-14 (×3): qty 6

## 2014-07-14 MED ORDER — ZIPRASIDONE HCL 80 MG PO CAPS
80.0000 mg | ORAL_CAPSULE | Freq: Every evening | ORAL | Status: DC
  Administered 2014-07-14 – 2014-07-16 (×3): 80 mg via ORAL
  Filled 2014-07-14 (×5): qty 1

## 2014-07-14 MED ORDER — LORAZEPAM 1 MG PO TABS
1.0000 mg | ORAL_TABLET | Freq: Two times a day (BID) | ORAL | Status: DC | PRN
  Administered 2014-07-15 – 2014-07-16 (×4): 1 mg via ORAL
  Filled 2014-07-14: qty 1
  Filled 2014-07-14 (×4): qty 2

## 2014-07-14 MED ORDER — CETYLPYRIDINIUM CHLORIDE 0.05 % MT LIQD
7.0000 mL | Freq: Two times a day (BID) | OROMUCOSAL | Status: DC
  Administered 2014-07-15 – 2014-07-17 (×5): 7 mL via OROMUCOSAL

## 2014-07-14 MED ORDER — CHLORHEXIDINE GLUCONATE 0.12 % MT SOLN
15.0000 mL | Freq: Two times a day (BID) | OROMUCOSAL | Status: DC
  Administered 2014-07-14 – 2014-07-17 (×6): 15 mL via OROMUCOSAL
  Filled 2014-07-14 (×6): qty 15

## 2014-07-14 MED ORDER — ALBUTEROL SULFATE (2.5 MG/3ML) 0.083% IN NEBU
2.5000 mg | INHALATION_SOLUTION | Freq: Once | RESPIRATORY_TRACT | Status: AC
  Administered 2014-07-14: 2.5 mg via RESPIRATORY_TRACT
  Filled 2014-07-14: qty 3

## 2014-07-14 MED ORDER — ZIPRASIDONE HCL 80 MG PO CAPS
ORAL_CAPSULE | ORAL | Status: AC
  Filled 2014-07-14: qty 1

## 2014-07-14 MED ORDER — FUROSEMIDE 20 MG PO TABS
20.0000 mg | ORAL_TABLET | Freq: Every morning | ORAL | Status: DC
  Administered 2014-07-15 – 2014-07-16 (×2): 20 mg via ORAL
  Filled 2014-07-14 (×2): qty 1

## 2014-07-14 MED ORDER — FENTANYL 25 MCG/HR TD PT72
25.0000 ug | MEDICATED_PATCH | TRANSDERMAL | Status: DC
  Filled 2014-07-14: qty 1

## 2014-07-14 MED ORDER — DOXYCYCLINE HYCLATE 100 MG PO TABS
100.0000 mg | ORAL_TABLET | Freq: Two times a day (BID) | ORAL | Status: DC
  Administered 2014-07-14 – 2014-07-17 (×6): 100 mg via ORAL
  Filled 2014-07-14 (×6): qty 1

## 2014-07-14 MED ORDER — SIMVASTATIN 20 MG PO TABS
40.0000 mg | ORAL_TABLET | Freq: Every evening | ORAL | Status: DC
  Administered 2014-07-15 – 2014-07-16 (×2): 40 mg via ORAL
  Filled 2014-07-14 (×2): qty 2

## 2014-07-14 MED ORDER — INSULIN ASPART 100 UNIT/ML ~~LOC~~ SOLN
0.0000 [IU] | Freq: Three times a day (TID) | SUBCUTANEOUS | Status: DC
  Administered 2014-07-15: 8 [IU] via SUBCUTANEOUS

## 2014-07-14 MED ORDER — CITALOPRAM HYDROBROMIDE 20 MG PO TABS
20.0000 mg | ORAL_TABLET | Freq: Every morning | ORAL | Status: DC
  Administered 2014-07-15 – 2014-07-17 (×3): 20 mg via ORAL
  Filled 2014-07-14 (×3): qty 1

## 2014-07-14 MED ORDER — INSULIN ASPART 100 UNIT/ML ~~LOC~~ SOLN
0.0000 [IU] | Freq: Every day | SUBCUTANEOUS | Status: DC
  Administered 2014-07-14: 2 [IU] via SUBCUTANEOUS
  Administered 2014-07-15 – 2014-07-16 (×2): 3 [IU] via SUBCUTANEOUS

## 2014-07-14 MED ORDER — IPRATROPIUM-ALBUTEROL 0.5-2.5 (3) MG/3ML IN SOLN
3.0000 mL | Freq: Once | RESPIRATORY_TRACT | Status: AC
  Administered 2014-07-14: 3 mL via RESPIRATORY_TRACT
  Filled 2014-07-14: qty 3

## 2014-07-14 MED ORDER — CLOPIDOGREL BISULFATE 75 MG PO TABS
75.0000 mg | ORAL_TABLET | Freq: Every day | ORAL | Status: DC
  Administered 2014-07-15 – 2014-07-17 (×3): 75 mg via ORAL
  Filled 2014-07-14 (×3): qty 1

## 2014-07-14 MED ORDER — IPRATROPIUM-ALBUTEROL 0.5-2.5 (3) MG/3ML IN SOLN
3.0000 mL | RESPIRATORY_TRACT | Status: DC
Start: 2014-07-14 — End: 2014-07-17
  Administered 2014-07-14 – 2014-07-17 (×15): 3 mL via RESPIRATORY_TRACT
  Filled 2014-07-14 (×15): qty 3

## 2014-07-14 MED ORDER — PANTOPRAZOLE SODIUM 40 MG PO TBEC
40.0000 mg | DELAYED_RELEASE_TABLET | Freq: Two times a day (BID) | ORAL | Status: DC
  Administered 2014-07-15 – 2014-07-17 (×5): 40 mg via ORAL
  Filled 2014-07-14 (×5): qty 1

## 2014-07-14 MED ORDER — SODIUM CHLORIDE 0.9 % IJ SOLN: 3.0000 mL | INTRAMUSCULAR | Status: DC | PRN

## 2014-07-14 MED ORDER — HEPARIN SODIUM (PORCINE) 5000 UNIT/ML IJ SOLN
5000.0000 [IU] | Freq: Three times a day (TID) | INTRAMUSCULAR | Status: DC
  Administered 2014-07-14 – 2014-07-17 (×8): 5000 [IU] via SUBCUTANEOUS
  Filled 2014-07-14 (×8): qty 1

## 2014-07-14 MED ORDER — SODIUM CHLORIDE 0.9 % IV SOLN: 250.0000 mL | INTRAVENOUS | Status: DC | PRN

## 2014-07-14 MED ORDER — ONDANSETRON HCL 4 MG/2ML IJ SOLN: 4.0000 mg | Freq: Four times a day (QID) | INTRAMUSCULAR | Status: DC | PRN

## 2014-07-14 MED ORDER — METHYLPREDNISOLONE SODIUM SUCC 125 MG IJ SOLR
60.0000 mg | Freq: Four times a day (QID) | INTRAMUSCULAR | Status: DC
  Administered 2014-07-14 – 2014-07-15 (×2): 60 mg via INTRAVENOUS
  Filled 2014-07-14 (×2): qty 2

## 2014-07-14 MED ORDER — METHYLPREDNISOLONE SODIUM SUCC 125 MG IJ SOLR
125.0000 mg | Freq: Once | INTRAMUSCULAR | Status: AC
  Administered 2014-07-14: 125 mg via INTRAVENOUS
  Filled 2014-07-14: qty 2

## 2014-07-14 MED ORDER — GABAPENTIN 300 MG PO CAPS
300.0000 mg | ORAL_CAPSULE | Freq: Three times a day (TID) | ORAL | Status: DC
  Administered 2014-07-14 – 2014-07-17 (×8): 300 mg via ORAL
  Filled 2014-07-14 (×7): qty 1
  Filled 2014-07-14: qty 3

## 2014-07-14 MED ORDER — TERAZOSIN HCL 1 MG PO CAPS
1.0000 mg | ORAL_CAPSULE | Freq: Every day | ORAL | Status: DC
  Administered 2014-07-15 – 2014-07-16 (×2): 1 mg via ORAL
  Filled 2014-07-14 (×2): qty 1

## 2014-07-14 MED ORDER — SODIUM CHLORIDE 0.9 % IJ SOLN
3.0000 mL | Freq: Two times a day (BID) | INTRAMUSCULAR | Status: DC
  Administered 2014-07-14 – 2014-07-17 (×5): 3 mL via INTRAVENOUS

## 2014-07-14 NOTE — ED Notes (Signed)
CRITICAL VALUE ALERT  Critical value received:  CO2 40   Date of notification:  07/14/14  Time of notification:  1935  Critical value read back:yes  Nurse who received alert:  Cheri Rous, RN  MD notified (1st page): [place]  Time of first page:  1940  MD notified (2nd page):  Time of second page:  Responding MD:  Roderic Palau  Time MD responded:  [phone]

## 2014-07-14 NOTE — ED Notes (Signed)
Pt laying down in bed, sat dropped to 85%, pt sitting up, on 4L at 90%

## 2014-07-14 NOTE — ED Notes (Addendum)
Sob since yesterday.  sp02 in 80's at home.  Red rash to face.     Confusion at times.    Pt is on Home 02

## 2014-07-14 NOTE — H&P (Signed)
[facility] History and Physical  Rick Cruz [id number] DOB: 1946/03/28 DOA: 07/14/2014  Referring physician: Dr Roderic Palau PCP: [name], MD   Chief Complaint: SOB  HPI: Rick Cruz is a 68 y.o. male  SOB. Started 2 days ago. On 3L at home at baseline but increased to 4L yesterday. Feels like his current episode may have started from taking too much norco. Typically only takes BID but recently started taking TID to QID. Quit smoking in December. Albuterol BID w/ some benefit.. Pt also states he was given ativan at home which helped.   Uses home insulin pump for DM management. Pt states that he resently refilled it but is unsure of exactly how to work it and his sugars have been in the 300 range since starting    Review of Systems:  Constitutional: No weight loss, night sweats, Fevers, chills, fatigue.  HEENT:  No headaches, Difficulty swallowing,Tooth/dental problems,Sore throat,  No sneezing, itching, ear ache, nasal congestion, post nasal drip,  Cardio-vascular:  No chest pain, Orthopnea, PND, swelling in lower extremities, anasarca, dizziness, palpitations  GI:  No heartburn, indigestion, abdominal pain, nausea, vomiting, diarrhea, change in bowel habits, loss of appetite  Resp:  Per HPI Skin:  rash GU:  no dysuria, change in color of urine, no urgency or frequency. No flank pain.  Musculoskeletal:  No joint pain or swelling. No decreased range of motion. No back pain.  Psych:  No change in mood or affect. No depression or anxiety. No memory loss.   Past Medical History  Diagnosis Date  . COPD (chronic obstructive pulmonary disease)   . Diabetes mellitus without complication   . CHF (congestive heart failure)     diastolic   . Hypercholesterolemia   . Depression   . Schizophrenia   . Sleep apnea     noncompliant with BiPAP  . Tobacco abuse   . On home O2     3 liters  . Chronic pain    Past Surgical History  Procedure Laterality Date  . Knee surgery       X2  . 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   . Colonoscopy  July 2011    Dr. Benson Norway: multiple polyps, internal and external hemorrhoids, diverticulosis. tubular adenoma  . Esophagogastroduodenoscopy (egd) with propofol N/A 04/20/2013    Procedure: ESOPHAGOGASTRODUODENOSCOPY (EGD) WITH PROPOFOL;  Surgeon: Daneil Dolin, MD;  Location: [facility];  Service: Endoscopy;  Laterality: N/A;   Social History:  reports that he quit smoking about 9 months ago. His smoking use included Cigarettes. He has a 50 pack-year smoking history. He has never used smokeless tobacco. He reports that he does not drink alcohol or use illicit drugs.  Allergies  Allergen Reactions  . Phenergan [Promethazine Hcl] Other (See Comments)    Becomes very confused and aggressive  . Haldol [Haloperidol Lactate] Other (See Comments)    Shaking     Family History  Problem Relation Age of Onset  . Colon cancer Neg Hx   . Parkinson's disease       Prior to Admission medications   Medication Sig Start Date End Date Taking? Authorizing Provider  albuterol (PROVENTIL HFA;VENTOLIN HFA) 108 (90 BASE) MCG/ACT inhaler Inhale 2 puffs into the lungs every 6 (six) hours as needed for wheezing or shortness of breath. 05/11/14  Yes Lezlie Octave Black, NP  albuterol (PROVENTIL) (2.5 MG/3ML) 0.083% nebulizer solution Take 3 mLs (  2.5 mg total) by nebulization every 6 (six) hours as needed for wheezing or shortness of breath. 05/11/14  Yes Radene Gunning, NP  aspirin 81 MG chewable tablet Chew 81 mg by mouth every morning.    Yes Historical Provider, MD  brimonidine (ALPHAGAN) 0.15 % ophthalmic solution Place 1 drop into the right eye 2 (two) times daily.    Yes Historical Provider, MD  Cinnamon 500 MG capsule Take 1,000 mg by mouth every morning.    Yes Historical Provider, MD  citalopram (CELEXA) 20 MG tablet Take 20 mg by mouth every morning.   Yes Historical  Provider, MD  clopidogrel (PLAVIX) 75 MG tablet Take 75 mg by mouth every morning.   Yes Historical Provider, MD  fentaNYL (DURAGESIC - DOSED MCG/HR) 50 MCG/HR Place 1 patch (50 mcg total) onto the skin every 3 (three) days. 05/11/14  Yes Lezlie Octave Black, NP  furosemide (LASIX) 20 MG tablet Take 20 mg by mouth every morning.   Yes Historical Provider, MD  gabapentin (NEURONTIN) 300 MG capsule Take 300 mg by mouth 3 (three) times daily.   Yes Historical Provider, MD  insulin lispro (HUMALOG) 100 UNIT/ML injection by Pump Prime route continuous.    Yes Historical Provider, MD  LORazepam (ATIVAN) 1 MG tablet Take 1 mg by mouth 2 (two) times daily as needed for anxiety.   Yes Historical Provider, MD  metoprolol (LOPRESSOR) 50 MG tablet Take 50 mg by mouth 2 (two) times daily.   Yes Historical Provider, MD  pantoprazole (PROTONIX) 40 MG tablet Take 40 mg by mouth 2 (two) times daily.   Yes Historical Provider, MD  simvastatin (ZOCOR) 40 MG tablet Take 40 mg by mouth every evening.   Yes Historical Provider, MD  terazosin (HYTRIN) 1 MG capsule Take 1 mg by mouth at bedtime.   Yes Historical Provider, MD  tiotropium (SPIRIVA HANDIHALER) 18 MCG inhalation capsule Place 1 capsule (18 mcg total) into inhaler and inhale every morning. 05/11/14  Yes Radene Gunning, NP  traZODone (DESYREL) 150 MG tablet Take 300 mg by mouth at bedtime.   Yes Historical Provider, MD  triamcinolone cream (KENALOG) 0.1 % Apply 1 application topically daily as needed (for irritation).    Yes Historical Provider, MD  ziprasidone (GEODON) 80 MG capsule Take 80 mg by mouth every evening.   Yes Historical Provider, MD   Physical Exam: Filed Vitals:   07/14/14 1928 07/14/14 2019 07/14/14 2030 07/14/14 2107  BP:  144/79 150/103   Pulse:  92 92   Temp:      TempSrc:      Resp:  20 19   SpO2: 94% 90% 92% 90%    Wt Readings from Last 3 Encounters:  05/11/14 130.2 kg (287 lb 0.6 oz)  01/10/14 124 kg (273 lb 5.9 oz)  06/17/13 131.09 kg  (289 lb)    General: Appears in mild distress Eyes:  PERRL, normal lids, irises & conjunctiva ENT:  grossly normal hearing, dry mucus membranes Neck:  no LAD, masses or thyromegaly Cardiovascular: RRR, III/VI systolic murmur 2+ bilat LE edema. Telemetry:  SR, no arrhythmias  Respiratory: diffuse wheezing w/ increased WOB. Conversational. No crackles.  Abdomen: soft, ntnd Skin:  Diffuse scaly rash on face and head.   Musculoskeletal:  grossly normal tone BUE/BLE Psychiatric:  odd affect but answers questions appropriately Neurologic:  grossly non-focal.          Labs on Admission:  Basic Metabolic Panel:  Recent Labs Lab 07/14/14 1830  NA 139  K 5.4*  CL 91*  CO2 40*  GLUCOSE 349*  BUN 10  CREATININE 1.02  CALCIUM 9.0   Liver Function Tests:  Recent Labs Lab 07/14/14 1830  AST 9  ALT 9  ALKPHOS 103  BILITOT 0.3  PROT 6.5  ALBUMIN 3.3*   No results found for this basename: LIPASE, AMYLASE,  in the last 168 hours No results found for this basename: AMMONIA,  in the last 168 hours CBC:  Recent Labs Lab 07/14/14 1830  WBC 12.2*  NEUTROABS 10.2*  HGB 10.5*  HCT 37.5*  MCV 95.2  PLT 246   Cardiac Enzymes:  Recent Labs Lab 07/14/14 1830  TROPONINI <0.30    BNP (last 3 results)  Recent Labs  01/02/14 0935 07/14/14 1830  PROBNP 229.0* 211.1*   CBG: No results found for this basename: GLUCAP,  in the last 168 hours  Radiological Exams on Admission: Dg Chest 2 View  07/14/2014   CLINICAL DATA:  Severe shortness of Breath today. History of COPD, diabetes CHF. Former smoker.  EXAM: CHEST  2 VIEW  COMPARISON:  05/08/2014  FINDINGS: There is hyperinflation of the lungs compatible with COPD. Small bilateral pleural effusions are noted. No confluent airspace opacities. Heart is borderline in size with vascular congestion. No overt edema.  IMPRESSION: COPD.  Borderline cardiomegaly with vascular congestion.  Small effusions.   Electronically Signed   By:  Rolm Baptise M.D.   On: 07/14/2014 17:54    EKG: Independently reviewed. NSR.   Assessment/Plan Active Problems:   COPD (chronic obstructive pulmonary disease) Rash Diabetes CHF (diastolic type) Schizophrenia Sleep apnea CAD  COPD exacerbation: pH 7.3, CO2 72.4, O2 62.3, Bicarb 40. On 4L [place]. Given 2 duonebs and solumedrol 125in the ED BIPAP dependent at night for sleep apnea - Admit - [place] continuous - BIPAP at night - solumdedrol 60mg  Q6 starting at 02:00 (consider changing to prednisone once evaluated in the am) - Duonebs Q4 - Doxy  Diabetes: A1c 6.6 05/08/14. Glucose currently well above 300. Pt now on insulin pump but unable to manage the pump. Suspect pt will be unable to handle increased need for insulin given steroids in acute illness. Pt to remove pump - start SSI - start long acting basal once establish baseline as pt unsure of how much he even uses daily  CHF: no sign of exacerbation. BNP 211 - monitor  Rash: scaly rash pt states is well treated by his home Rx when he remembers to use it. Unsure of what it is. Steroid cream listed.  - Start mycolog cream for head and face. (dual approach for eczematous type flare and fungal). Stop after 5-7 days  CAD:  - continue ASA and Plavix  Depression/Anxiety:  - continue hoem celexa and Ativan - Trazodone prn sleep  HTN: slighlty elevated on admission - continue home metoprolol  Chronic pain:  - decreased fentanyl patch by half to 78mcg Q72 hrs - continue gabapentin   Schizophrenia: at baseline. Pt w/ flattened affect but seems to understand what is going on and what needs to happen - continue geodon   Code Status: FULL DVT Prophylaxis: Hep  Family Communication: none Disposition Plan: pending improvement   Sereniti Wan, [facility] [email or web address] Password [email or web address]

## 2014-07-14 NOTE — ED Notes (Signed)
CRITICAL VALUE ALERT  Critical value received:  CO2 72.4  Date of notification:  07/14/14  Time of notification:  2015  Critical value read back: yes  Nurse who received alert:  Cheri Rous  MD notified (1st page):  [place]  Time of first page: 2015  MD notified (2nd page):  Time of second page:  Responding MD:  Roderic Palau  Time MD responded:  2015

## 2014-07-14 NOTE — ED Provider Notes (Signed)
CSN: [id number]     Arrival date & time 07/14/14  1655 History  This chart was scribed for Rick Diego, MD by Delphia Grates, ED Scribe. This patient was seen in room APA04/APA04 and the patient's care was started at 7:03 PM.     Chief Complaint  Patient presents with  . Shortness of Breath    Patient is a 68 y.o. male presenting with shortness of breath. The history is provided by the patient and a relative. No language interpreter was used.  Shortness of Breath Severity:  Moderate Onset quality:  Gradual Duration:  1 day Timing:  Constant Progression:  Worsening Relieved by:  Nothing Ineffective treatments:  None tried Associated symptoms: rash   Associated symptoms: no abdominal pain, no chest pain, no cough and no headaches     HPI Comments: Rick Cruz is a 68 y.o. male, with history of COPD, CHF, and sleep apnea, who presents to the Emergency Department complaining of worsening SOB since yesterday. Per family, paramedics were called yesterday per patient's request because he "could not breathe". There is associated facial rash. Family also notes slight confusion. Patient was last admitted to the hospital 2 months ago in August for CO2 buildup. He denies fever or cough. He is currently on 3L of O2 at home. Patient resides with family and receives Home Care.    Past Medical History  Diagnosis Date  . COPD (chronic obstructive pulmonary disease)   . Diabetes mellitus without complication   . CHF (congestive heart failure)     diastolic   . Hypercholesterolemia   . Depression   . Schizophrenia   . Sleep apnea     noncompliant with BiPAP  . Tobacco abuse   . On home O2     3 liters  . Chronic pain    Past Surgical History  Procedure Laterality Date  . Knee surgery      X2  . 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   . Colonoscopy  July 2011    Dr. Benson Norway: multiple polyps, internal  and external hemorrhoids, diverticulosis. tubular adenoma  . Esophagogastroduodenoscopy (egd) with propofol N/A 04/20/2013    Procedure: ESOPHAGOGASTRODUODENOSCOPY (EGD) WITH PROPOFOL;  Surgeon: Daneil Dolin, MD;  Location: [facility];  Service: Endoscopy;  Laterality: N/A;   Family History  Problem Relation Age of Onset  . Colon cancer Neg Hx   . Parkinson's disease     History  Substance Use Topics  . Smoking status: Former Smoker -- 1.00 packs/day for 50 years    Types: Cigarettes    Quit date: 09/16/2013  . Smokeless tobacco: Never Used  . Alcohol Use: No     Comment: history of ETOH abuse in remote past, none in at least 10 years    Review of Systems  Constitutional: Negative for appetite change and fatigue.  HENT: Negative for congestion, ear discharge and sinus pressure.   Eyes: Negative for discharge.  Respiratory: Positive for shortness of breath. Negative for cough.   Cardiovascular: Negative for chest pain.  Gastrointestinal: Negative for abdominal pain and diarrhea.  Genitourinary: Negative for frequency and hematuria.  Musculoskeletal: Negative for back pain.  Skin: Positive for rash.  Neurological: Negative for seizures and headaches.  Psychiatric/Behavioral: Positive for confusion. Negative for hallucinations.      Allergies  Phenergan and Haldol  Home Medications   Prior to Admission medications   Medication Sig  Start Date End Date Taking? Authorizing Provider  albuterol (PROVENTIL HFA;VENTOLIN HFA) 108 (90 BASE) MCG/ACT inhaler Inhale 2 puffs into the lungs every 6 (six) hours as needed for wheezing or shortness of breath. 05/11/14   Radene Gunning, NP  albuterol (PROVENTIL) (2.5 MG/3ML) 0.083% nebulizer solution Take 3 mLs (2.5 mg total) by nebulization every 6 (six) hours as needed for wheezing or shortness of breath. 05/11/14   Radene Gunning, NP  aspirin 81 MG chewable tablet Chew 81 mg by mouth daily.    Historical Provider, MD  brimonidine (ALPHAGAN) 0.15 %  ophthalmic solution Place 1 drop into both eyes 2 (two) times daily.    Historical Provider, MD  Cinnamon 500 MG capsule Take 500 mg by mouth daily.    Historical Provider, MD  enoxaparin (LOVENOX) 40 MG/0.4ML injection Inject 0.6 mLs (60 mg total) into the skin daily. 05/11/14   Radene Gunning, NP  fentaNYL (DURAGESIC - DOSED MCG/HR) 50 MCG/HR Place 1 patch (50 mcg total) onto the skin every 3 (three) days. 05/11/14   Radene Gunning, NP  gabapentin (NEURONTIN) 300 MG capsule Take 300 mg by mouth 3 (three) times daily.    Historical Provider, MD  insulin lispro (HUMALOG) 100 UNIT/ML injection Inject 14 Units into the skin 3 (three) times daily.    Historical Provider, MD  metoprolol (LOPRESSOR) 50 MG tablet Take 50 mg by mouth 2 (two) times daily.    Historical Provider, MD  predniSONE (DELTASONE) 20 MG tablet Take 1 tablet (20 mg total) by mouth daily with breakfast. 05/12/14   Radene Gunning, NP  simvastatin (ZOCOR) 40 MG tablet Take 40 mg by mouth every evening.    Historical Provider, MD  terazosin (HYTRIN) 1 MG capsule Take 1 mg by mouth at bedtime.    Historical Provider, MD  tiotropium (SPIRIVA HANDIHALER) 18 MCG inhalation capsule Place 1 capsule (18 mcg total) into inhaler and inhale every morning. 05/11/14   Radene Gunning, NP  triamcinolone cream (KENALOG) 0.1 % Apply 1 application topically 2 (two) times daily.    Historical Provider, MD   Triage Vitals: BP 141/90  Pulse 102  Temp(Src) 97.4 F (36.3 C) (Oral)  Resp 22  SpO2 91%  Physical Exam  Nursing note and vitals reviewed. Constitutional: He is oriented to person, place, and time. He appears well-developed.  HENT:  Head: Normocephalic.  Eyes: Conjunctivae and EOM are normal. No scleral icterus.  Neck: Neck supple. No thyromegaly present.  Cardiovascular: Normal rate and regular rhythm.  Exam reveals no gallop and no friction rub.   No murmur heard. Pulmonary/Chest: No stridor. He has wheezes. He has no rales. He exhibits no  tenderness.  Mild wheezing bilaterally.   Abdominal: He exhibits no distension. There is no tenderness. There is no rebound.  Musculoskeletal: Normal range of motion. He exhibits no edema.  Lymphadenopathy:    He has no cervical adenopathy.  Neurological: He is oriented to person, place, and time. He exhibits normal muscle tone. Coordination normal.  Skin: No rash noted. No erythema.  Psychiatric: He has a normal mood and affect. His behavior is normal.    ED Course  Procedures (including critical care time)  DIAGNOSTIC STUDIES: Oxygen Saturation is 91% on [place], adequate by my interpretation.    COORDINATION OF CARE: At 1912 Discussed treatment plan with patient which includes breathing treatment. Patient agrees.   Labs Review Labs Reviewed  CBC WITH DIFFERENTIAL - Abnormal; Notable for the following:  WBC 12.2 (*)    RBC 3.94 (*)    Hemoglobin 10.5 (*)    HCT 37.5 (*)    MCHC 28.0 (*)    Neutrophils Relative % 83 (*)    Neutro Abs 10.2 (*)    Lymphocytes Relative 10 (*)    All other components within normal limits  COMPREHENSIVE METABOLIC PANEL - Abnormal; Notable for the following:    Potassium 5.4 (*)    Chloride 91 (*)    CO2 40 (*)    Glucose, Bld 349 (*)    Albumin 3.3 (*)    GFR calc non Af Amer 74 (*)    GFR calc Af Amer 86 (*)    All other components within normal limits  PRO B NATRIURETIC PEPTIDE - Abnormal; Notable for the following:    Pro B Natriuretic peptide (BNP) 211.1 (*)    All other components within normal limits  BLOOD GAS, ARTERIAL - Abnormal; Notable for the following:    pCO2 arterial 72.4 (*)    pO2, Arterial 62.3 (*)    Bicarbonate 40.8 (*)    All other components within normal limits  TROPONIN I    Imaging Review Dg Chest 2 View  07/14/2014   CLINICAL DATA:  Severe shortness of Breath today. History of COPD, diabetes CHF. Former smoker.  EXAM: CHEST  2 VIEW  COMPARISON:  05/08/2014  FINDINGS: There is hyperinflation of the lungs  compatible with COPD. Small bilateral pleural effusions are noted. No confluent airspace opacities. Heart is borderline in size with vascular congestion. No overt edema.  IMPRESSION: COPD.  Borderline cardiomegaly with vascular congestion.  Small effusions.   Electronically Signed   By: Rolm Baptise M.D.   On: 07/14/2014 17:54      Patient Name: Annett Fabian  Care Plan updated: Tonji Elliff L , @TODAY @ , 8:48 PM  PCP:  [name], MD  PMH:   Past Medical History  Diagnosis Date  . COPD (chronic obstructive pulmonary disease)   . Diabetes mellitus without complication   . CHF (congestive heart failure)     diastolic   . Hypercholesterolemia   . Depression   . Schizophrenia   . Sleep apnea     noncompliant with BiPAP  . Tobacco abuse   . On home O2     3 liters  . Chronic pain    CRITICAL CARE Performed by: Xaviar Lunn L Total critical care time: 40 Critical care time was exclusive of separately billable procedures and treating other patients. Critical care was necessary to treat or prevent imminent or life-threatening deterioration. Critical care was time spent personally by me on the following activities: development of treatment plan with patient and/or surrogate as well as nursing, discussions with consultants, evaluation of patient's response to treatment, examination of patient, obtaining history from patient or surrogate, ordering and performing treatments and interventions, ordering and review of laboratory studies, ordering and review of radiographic studies, pulse oximetry and re-evaluation of patient's condition.       MDM   Final diagnoses:  SOB (shortness of breath)   The chart was scribed for me under my direct supervision.  I personally performed the history, physical, and medical decision making and all procedures in the evaluation of this patient.Rick Diego, MD 07/14/14 [phone]

## 2014-07-15 DIAGNOSIS — I369 Nonrheumatic tricuspid valve disorder, unspecified: Secondary | ICD-10-CM

## 2014-07-15 LAB — CBC
HEMATOCRIT: 35.3 % — AB (ref 39.0–52.0)
HEMOGLOBIN: 10.2 g/dL — AB (ref 13.0–17.0)
MCH: 26.8 pg (ref 26.0–34.0)
MCHC: 28.9 g/dL — ABNORMAL LOW (ref 30.0–36.0)
MCV: 92.9 fL (ref 78.0–100.0)
Platelets: 238 10*3/uL (ref 150–400)
RBC: 3.8 MIL/uL — AB (ref 4.22–5.81)
RDW: 13.9 % (ref 11.5–15.5)
WBC: 9.7 10*3/uL (ref 4.0–10.5)

## 2014-07-15 LAB — BASIC METABOLIC PANEL
Anion gap: 6 (ref 5–15)
BUN: 10 mg/dL (ref 6–23)
CO2: 40 meq/L — AB (ref 19–32)
CREATININE: 0.83 mg/dL (ref 0.50–1.35)
Calcium: 9.2 mg/dL (ref 8.4–10.5)
Chloride: 96 mEq/L (ref 96–112)
GFR calc Af Amer: >90 mL/min (ref >90)
GFR, EST NON AFRICAN AMERICAN: 89 mL/min — AB (ref >90)
GLUCOSE: 302 mg/dL — AB (ref 70–99)
Potassium: 5.3 mEq/L (ref 3.7–5.3)
Sodium: 142 mEq/L (ref 137–147)

## 2014-07-15 LAB — GLUCOSE, CAPILLARY
GLUCOSE-CAPILLARY: 306 mg/dL — AB (ref 70–99)
Glucose-Capillary: 287 mg/dL — ABNORMAL HIGH (ref 70–99)
Glucose-Capillary: 337 mg/dL — ABNORMAL HIGH (ref 70–99)
Glucose-Capillary: 252 mg/dL — ABNORMAL HIGH (ref 70–99)

## 2014-07-15 MED ORDER — INSULIN GLARGINE 100 UNIT/ML ~~LOC~~ SOLN
25.0000 [IU] | Freq: Every day | SUBCUTANEOUS | Status: DC
Start: 2014-07-15 — End: 2014-07-17
  Administered 2014-07-15 – 2014-07-17 (×3): 25 [IU] via SUBCUTANEOUS
  Filled 2014-07-15 (×5): qty 0.25

## 2014-07-15 MED ORDER — PREDNISONE 20 MG PO TABS
40.0000 mg | ORAL_TABLET | Freq: Every day | ORAL | Status: DC
  Administered 2014-07-16 – 2014-07-17 (×2): 40 mg via ORAL
  Filled 2014-07-15 (×2): qty 2

## 2014-07-15 MED ORDER — LORAZEPAM 2 MG/ML IJ SOLN
1.0000 mg | Freq: Once | INTRAMUSCULAR | Status: AC
  Administered 2014-07-15: 2 mg via INTRAVENOUS
  Filled 2014-07-15: qty 1

## 2014-07-15 MED ORDER — INSULIN ASPART 100 UNIT/ML ~~LOC~~ SOLN
4.0000 [IU] | Freq: Three times a day (TID) | SUBCUTANEOUS | Status: DC
  Administered 2014-07-15 – 2014-07-17 (×7): 4 [IU] via SUBCUTANEOUS

## 2014-07-15 MED ORDER — INSULIN ASPART 100 UNIT/ML ~~LOC~~ SOLN
0.0000 [IU] | Freq: Three times a day (TID) | SUBCUTANEOUS | Status: DC
  Administered 2014-07-15 (×2): 7 [IU] via SUBCUTANEOUS
  Administered 2014-07-16: 5 [IU] via SUBCUTANEOUS
  Administered 2014-07-16: 3 [IU] via SUBCUTANEOUS
  Administered 2014-07-16 – 2014-07-17 (×2): 2 [IU] via SUBCUTANEOUS
  Administered 2014-07-17: 1 [IU] via SUBCUTANEOUS

## 2014-07-15 NOTE — Progress Notes (Signed)
*  PRELIMINARY RESULTS* Echocardiogram 2D Echocardiogram has been performed.  Leavy Cella 07/15/2014, 11:38 AM

## 2014-07-15 NOTE — Progress Notes (Signed)
Inpatient Diabetes Program Recommendations  AACE/ADA: New Consensus Statement on Inpatient Glycemic Control (2013)  Target Ranges:  Prepandial:   less than 140 mg/dL      Peak postprandial:   less than 180 mg/dL (1-2 hours)      Critically ill patients:  140 - 180 mg/dL   Results for AIIDEN, DARGA (MRN [id number]) as of 07/15/2014 09:14  Ref. Range 07/14/2014 21:46 07/14/2014 22:28 07/15/2014 07:35  Glucose-Capillary Latest Range: 70-99 mg/dL 290 (H) 304 (H) 287 (H)   Diabetes history: DM2 Outpatient Diabetes medications: Medtronic insulin pump with Humalog Current orders for Inpatient glycemic control: Novolog 0-15 units AC, Novolog 0-5 units HS  Inpatient Diabetes Program Recommendations Insulin - Basal: Please consider ordering Lantus 25 units daily (to start now). Insulin - Meal Coverage: Please consider ordering Novolog 5 units TID wtih meals for meal coverage if patient eats at least 50% of meal.  Note: Patient was hospitalized from 05/08/14 to 05/11/14 and was seen by diabetes coordinator on 05/10/14 (see note for details and pump settings at that time). When patient was seen on 05/10/14 he reported glucose ranged from 35-300's mg/dl; he did not know much about his insulin pump, did not know any of his settings, and did not know how to count carbohydrates. Noted in the H&P that patient is "unsure of exactly how to work it and his sugars have been in the 300 range since starting". If patient still has a lack of knowledge about his insulin pump and glucose has continued to be elevated in the 300's, he may not be appropriate for insulin pump use. At this time, patient needs basal and bolus insulin injections. Please consider ordering Lantus 25 units daily and Novolog 5 units TID with meals for meal coverage.   Thanks, Barnie Alderman, RN, MSN, CCRN Diabetes Coordinator Inpatient Diabetes Program [phone] (Team Pager) [phone] ([facility] office) [phone] [facility] office)

## 2014-07-15 NOTE — Progress Notes (Addendum)
Rick Cruz [id number] DOB: 11/25/1945 DOA: 07/14/2014 PCP: [name], MD  Brief narrative:  68 y/o ? OSA noncompliant CPAP [basleine DOE on crossing room]> COPD[mild obs disease 2/2 emphysema vs asthmatic bronchitis], Obesity, chronic pain syndrome, DM ty 2, schizophrenia, h/o Tubulovillous Cecum adenoma, HLD admitted weith 2-3 day h/o increasing SOB and DOE  Past medical history-As per Problem list Chart reviewed as below- Reviewed  Consultants:  None  Procedures:  Chest x-ray  Antibiotics:  Doxycycline 10/28   Subjective  Feels better overall Eating breakfast at bedside On nasal cannula States that he thinks he may be taken "too much" of his Norco and this may have caused his symptoms No chest pain no lower extremity swelling no ISSUES currently   Objective    Interim History:   Telemetry: Sinus tachycardia   Objective: Filed Vitals:   07/15/14 0643 07/15/14 0700 07/15/14 0753 07/15/14 0800  BP:      Pulse:  93    Temp:   98.2 F (36.8 C)   TempSrc:   Axillary   Resp:  24  12  Height:      Weight:      SpO2: 95% 99%  99%    Intake/Output Summary (Last 24 hours) at 07/15/14 0850 Last data filed at 07/15/14 0800  Gross per 24 hour  Intake    120 ml  Output      0 ml  Net    120 ml    Exam:  General: Alert pleasant morbidly obese no acute distress Cardiovascular: S1-S2 no murmur rub or gallop Respiratory: Clinically clear no added sounds Abdomen: Soft nontender nondistended Skin no lower extremity edema Neuro range of motion intact  Data Reviewed: Basic Metabolic Panel:  Recent Labs Lab 07/14/14 1830  NA 139  K 5.4*  CL 91*  CO2 40*  GLUCOSE 349*  BUN 10  CREATININE 1.02  CALCIUM 9.0   Liver Function Tests:  Recent Labs Lab 07/14/14 1830  AST 9  ALT 9  ALKPHOS 103  BILITOT 0.3  PROT 6.5  ALBUMIN 3.3*   No results found for this basename: LIPASE, AMYLASE,  in the last 168 hours No results found for this basename:  AMMONIA,  in the last 168 hours CBC:  Recent Labs Lab 07/14/14 1830  WBC 12.2*  NEUTROABS 10.2*  HGB 10.5*  HCT 37.5*  MCV 95.2  PLT 246   Cardiac Enzymes:  Recent Labs Lab 07/14/14 1830  TROPONINI <0.30   BNP: No components found with this basename: POCBNP,  CBG:  Recent Labs Lab 07/14/14 2146 07/14/14 2228 07/15/14 0735  GLUCAP 290* 304* 287*    Recent Results (from the past 240 hour(s))  MRSA PCR SCREENING     Status: None   Collection Time    07/14/14 10:35 PM      Result Value Ref Range Status   MRSA by PCR NEGATIVE  NEGATIVE Final   Comment:            The GeneXpert MRSA Assay (FDA     approved for NASAL specimens     only), is one component of a     comprehensive MRSA colonization     surveillance program. It is not     intended to diagnose MRSA     infection nor to guide or     monitor treatment for     MRSA infections.     Studies:  All Imaging reviewed and is as per above notation   Scheduled Meds: . antiseptic oral rinse  7 mL Mouth Rinse q12n4p  . chlorhexidine  15 mL Mouth Rinse BID  . citalopram  20 mg Oral q morning - 10a  . clopidogrel  75 mg Oral QAC breakfast  . doxycycline  100 mg Oral Q12H  . [START ON 07/17/2014] fentaNYL  25 mcg Transdermal Q72H  . furosemide  20 mg Oral q morning - 10a  . gabapentin  300 mg Oral TID  . heparin  5,000 Units Subcutaneous 3 times per day  . insulin aspart  0-15 Units Subcutaneous TID WC  . insulin aspart  0-5 Units Subcutaneous QHS  . ipratropium-albuterol  3 mL Nebulization Q4H  . methylPREDNISolone (SOLU-MEDROL) injection  60 mg Intravenous Q6H  . metoprolol  50 mg Oral BID  . nystatin-triamcinolone   Topical BID  . pantoprazole  40 mg Oral BID  . simvastatin  40 mg Oral QPM  . sodium chloride  3 mL Intravenous Q12H  . terazosin  1 mg Oral QHS  . traZODone  300 mg Oral QHS  . ziprasidone  80 mg Oral QPM   Continuous Infusions:    Assessment/Plan: 1. Acute hypoxic  respiratory failure in noncompliant patient supposed to use CPAP-hypoxia likely secondary to multiple chronic narcotic medications which may have decreased his respiratory drive and cause significant air trapping. He will need to be on nasal cannula oxygen and be monitored but looks much improved and is satting in the 90s on room air. He can be transferred to telemetry 2. Stable COPD. No wheeze no rails or rhonchi. Transitioned from IV Solu-Medrol to by mouth prednisone for 3 days although it do not think there is an acute component to this. Continue duo nebs every 4 hourly and monitor. 3. Chronic pain syndrome. Patient will need to have a discussion with his regular physician about his medications. 4. Type 2 diabetes mellitus on insulin pump-patient has some confusion about his medications e. We will place him on Lantus 25 units and 3 times a day insulin for coverage.  I do not know long-term from insulin pump is safe for him if his confusion persists. Continue gabapentin 300 3 times a day for now 5. Schizophrenia-continue citalopram 20 mg every morning, trazodone 300 every afternoon, Geodon 80 daily p.m.. May need to cut back dosing and make sure that this does not affect his sensorium. Continue when necessary Ativan 1 mg twice a day 6. ? Possible CHF-chest x-ray two-view 10/20 shows vascular congestion, BNP 200 and get echocardiogram. Suspect he will have at least diastolic dysfunction given his habitus.  Hold Lasix for right now 7. Hyperkalemia-unclear etiology. Repeat labs in a.m. 8. Hyperlipidemia continue Zocor 40 mg every afternoon 9. Hypertension-continue metoprolol 20 mg twice a day 10. BPH continue terazosin 1 mg daily at bedtime 11. Polypharmacy he is on numerous antidepressants as well as gabapentin all of which can confuse him. We will monitor him overnight  Code Status: Full  Family Communication:None at bedside  Disposition Plan: Transferred to telemetry  Verneita Griffes, MD  [facility] Pager [phone] 07/15/2014, 8:50 AM    LOS: 1 day

## 2014-07-15 NOTE — Care Management Utilization Note (Signed)
UR completed 

## 2014-07-15 NOTE — Progress Notes (Signed)
Difficult time to get patient settled on the bipap with a pco2 of 76.  HS medications given and had to call for additional meds by Dr. Ernestina Patches.  Patient finally wearing bipap at 0230.  Spoke with wife earlier (she tried to talk husband into wearing mask) he was just too confused.  Trying to get oob multiple times, wanting to go home.  Strong male. Has had mask on for 1 hr.  Remains resting.  Monitoring closely for safety.

## 2014-07-16 ENCOUNTER — Other Ambulatory Visit (HOSPITAL_COMMUNITY): Payer: Self-pay | Admitting: Respiratory Therapy

## 2014-07-16 DIAGNOSIS — R0683 Snoring: Secondary | ICD-10-CM

## 2014-07-16 LAB — BASIC METABOLIC PANEL
ANION GAP: 5 (ref 5–15)
BUN: 15 mg/dL (ref 6–23)
CALCIUM: 8.6 mg/dL (ref 8.4–10.5)
CO2: 40 meq/L — AB (ref 19–32)
Chloride: 96 mEq/L (ref 96–112)
Creatinine, Ser: 0.8 mg/dL (ref 0.50–1.35)
GFR calc Af Amer: >90 mL/min (ref >90)
Glucose, Bld: 190 mg/dL — ABNORMAL HIGH (ref 70–99)
Potassium: 4.8 mEq/L (ref 3.7–5.3)
Sodium: 141 mEq/L (ref 137–147)

## 2014-07-16 LAB — GLUCOSE, CAPILLARY
GLUCOSE-CAPILLARY: 187 mg/dL — AB (ref 70–99)
GLUCOSE-CAPILLARY: 279 mg/dL — AB (ref 70–99)
Glucose-Capillary: 226 mg/dL — ABNORMAL HIGH (ref 70–99)
Glucose-Capillary: 283 mg/dL — ABNORMAL HIGH (ref 70–99)

## 2014-07-16 MED ORDER — FUROSEMIDE 20 MG PO TABS
20.0000 mg | ORAL_TABLET | Freq: Two times a day (BID) | ORAL | Status: DC
  Administered 2014-07-16 – 2014-07-17 (×2): 20 mg via ORAL
  Filled 2014-07-16 (×2): qty 1

## 2014-07-16 NOTE — Progress Notes (Signed)
Rick Cruz [id number] DOB: [birth year] DOA: 07/14/2014 PCP: [name], MD  Brief narrative:  68 y/o ? OSA noncompliant CPAP [basleine DOE on crossing room]> COPD[mild obs disease 2/2 emphysema vs asthmatic bronchitis], Obesity, chronic pain syndrome, DM ty 2, schizophrenia, h/o Tubulovillous Cecum adenoma, HLD admitted weith 2-3 day h/o increasing SOB and DOE  Past medical history-As per Problem list Chart reviewed as below- Reviewed  Consultants:  None  Procedures:  Chest x-ray  Antibiotics:  Doxycycline 10/28   Subjective  Looks and feels better Much less SOB Willing to trial Bipap/CPap as OP again and undertsands the need for it NO current CP    Objective    Interim History:   Telemetry: Sinus tachycardia   Objective: Filed Vitals:   07/16/14 0600 07/16/14 0752 07/16/14 0754 07/16/14 0800  BP: 130/66   143/76  Pulse: 83   76  Temp:  97.1 F (36.2 C)    TempSrc:  Oral    Resp: 14   17  Height:      Weight:      SpO2: 90%  95% 99%    Intake/Output Summary (Last 24 hours) at 07/16/14 0922 Last data filed at 07/16/14 0900  Gross per 24 hour  Intake    560 ml  Output    650 ml  Net    -90 ml    Exam:  General: Alert pleasant morbidly obese no acute distress Cardiovascular: S1-S2 no murmur rub or gallop Respiratory: Clinically clear no added sounds Abdomen: Soft nontender nondistended Skin no lower extremity edema Neuro range of motion intact  Data Reviewed: Basic Metabolic Panel:  Recent Labs Lab 07/14/14 1830 07/15/14 0826 07/16/14 0646  NA 139 142 141  K 5.4* 5.3 4.8  CL 91* 96 96  CO2 40* 40* 40*  GLUCOSE 349* 302* 190*  BUN 10 10 15   CREATININE 1.02 0.83 0.80  CALCIUM 9.0 9.2 8.6   Liver Function Tests:  Recent Labs Lab 07/14/14 1830  AST 9  ALT 9  ALKPHOS 103  BILITOT 0.3  PROT 6.5  ALBUMIN 3.3*   No results found for this basename: LIPASE, AMYLASE,  in the last 168 hours No results found for this basename:  AMMONIA,  in the last 168 hours CBC:  Recent Labs Lab 07/14/14 1830 07/15/14 0826  WBC 12.2* 9.7  NEUTROABS 10.2*  --   HGB 10.5* 10.2*  HCT 37.5* 35.3*  MCV 95.2 92.9  PLT 246 238   Cardiac Enzymes:  Recent Labs Lab 07/14/14 1830  TROPONINI <0.30   BNP: No components found with this basename: POCBNP,  CBG:  Recent Labs Lab 07/15/14 0735 07/15/14 1135 07/15/14 1615 07/15/14 2052 07/16/14 0742  GLUCAP 287* 337* 306* 252* 187*    Recent Results (from the past 240 hour(s))  MRSA PCR SCREENING     Status: None   Collection Time    07/14/14 10:35 PM      Result Value Ref Range Status   MRSA by PCR NEGATIVE  NEGATIVE Final   Comment:            The GeneXpert MRSA Assay (FDA     approved for NASAL specimens     only), is one component of a     comprehensive MRSA colonization     surveillance program. It is not     intended to diagnose MRSA     infection nor to guide or     monitor treatment for     MRSA  infections.     Studies:              All Imaging reviewed and is as per above notation   Scheduled Meds: . antiseptic oral rinse  7 mL Mouth Rinse q12n4p  . chlorhexidine  15 mL Mouth Rinse BID  . citalopram  20 mg Oral q morning - 10a  . clopidogrel  75 mg Oral QAC breakfast  . doxycycline  100 mg Oral Q12H  . [START ON 07/17/2014] fentaNYL  25 mcg Transdermal Q72H  . furosemide  20 mg Oral q morning - 10a  . gabapentin  300 mg Oral TID  . heparin  5,000 Units Subcutaneous 3 times per day  . insulin aspart  0-5 Units Subcutaneous QHS  . insulin aspart  0-9 Units Subcutaneous TID WC  . insulin aspart  4 Units Subcutaneous TID WC  . insulin glargine  25 Units Subcutaneous Daily  . ipratropium-albuterol  3 mL Nebulization Q4H  . metoprolol  50 mg Oral BID  . nystatin-triamcinolone   Topical BID  . pantoprazole  40 mg Oral BID  . predniSONE  40 mg Oral QAC breakfast  . simvastatin  40 mg Oral QPM  . sodium chloride  3 mL Intravenous Q12H  . terazosin   1 mg Oral QHS  . traZODone  300 mg Oral QHS  . ziprasidone  80 mg Oral QPM   Continuous Infusions:    Assessment/Plan: 1. Acute hypoxic respiratory failure in noncompliant patient supposed to use CPAP-hypoxia likely secondary to multiple chronic narcotic medications which may have decreased his respiratory drive and cause significant air trapping.also non-complioant with bipapa and had to return his BiPAP machine because of noncompliance so he will need to be set up with a CPAP appointment which is for next Tuesday. In the interim he may be able to get CPAP at home on discharge 2. Stable COPD. No wheeze no rails or rhonchi. Transitioned from IV Solu-Medrol to by mouth prednisone for 3 days although it do not think there is an acute component to this. Continue duo nebs every 4 hourly and monitor. 3. Chronic pain syndrome. Patient will need to have a discussion with his regular physician about his medications. 4. Type 2 diabetes mellitus on insulin pump-patient has some confusion about his medications e. We will place him on Lantus 25 units and 3 times a day insulin for coverage.  Diabetic coordinator has spoken with him at length about some of the functions of his pump and he will need further follow-up as an outpatient. He should resume his pump on discharge-blood sugars have been in the 187-283 range today 5. Schizophrenia-continue citalopram 20 mg every morning, trazodone 300 every afternoon, Geodon 80 daily p.m.  May need to cut back dosing and make sure that this does not affect his sensorium. Continue when necessary Ativan 1 mg twice a day 6. Acute decompensated diastolic CHF-chest x-ray two-view 10/20 shows vascular congestion, BNP 200.  We will start low doses of Lasix 20 mg by mouth twice a day and he will need close outpatient follow-up he may need to be seen by cardiology  7. Hyperkalemia-unclear etiology. A.m. labs are better 8. Hyperlipidemia continue Zocor 40 mg every  afternoon 9. Hypertension-continue metoprolol 20 mg twice a day 10. BPH continue terazosin 1 mg daily at bedtime 11. Polypharmacy he is on numerous antidepressants as well as gabapentin all of which can confuse him. We will monitor him overnight  Code Status: Full  Family Communication:None  at bedside  Disposition Plan: Transferred to telemetry-likely discharged to home with CPAP and close follow-up instructions insulin pump  Verneita Griffes, MD  [facility] Pager [phone] 07/16/2014, 9:22 AM    LOS: 2 days

## 2014-07-16 NOTE — Progress Notes (Signed)
Inpatient Diabetes Program Recommendations  AACE/ADA: New Consensus Statement on Inpatient Glycemic Control (2013)  Target Ranges:  Prepandial:   less than 140 mg/dL      Peak postprandial:   less than 180 mg/dL (1-2 hours)      Critically ill patients:  140 - 180 mg/dL   Reason for Visit: Elevated CBG  Diabetes history: Type 2 Outpatient Diabetes medications: Humalog insulin via insulin pump Current orders for Inpatient glycemic control: Lantus 25 units daily at noon, Novolog 0-5 units qhs, Novolog 0-9units with meals, Novolog 4 units with meals  Spoke with RN, Justice Rocher who verified patient does not have his insulin pump on here at the hospital.  He may be going home today so I spoke with the patient.  He will be getting his Lantus insulin at noon here at the hospital today.  He has agreed to give his bolus Humalog insulin to himself this evening by syringe.  He will then put his pump on tomorrow morning and begin his basal and bolus by pump tomorrow.  Mr. Unthank does not know every feature on his insulin pump so he does not know how to suspend basal insulin while having his pump on- therefore giving insulin by syringe this evening is ideal.  He is currently working with an insulin pump trainer, Darl Householder.   Gentry Fitz, RN, BA, MHA, CDE Diabetes Coordinator Inpatient Diabetes Program  [phone] (Team Pager) [phone] [facility]) 07/16/2014 9:26 AM

## 2014-07-16 NOTE — Progress Notes (Signed)
Report called to Lovena Le, RN. Patient transferred to 301 via bed.

## 2014-07-16 NOTE — Care Management Note (Signed)
    Page 1 of 2   07/16/2014     2:12:25 PM CARE MANAGEMENT NOTE 07/16/2014  Patient:  [facility]   Account Number:  [id number]  Date Initiated:  07/16/2014  Documentation initiated by:  Theophilus Kinds  Subjective/Objective Assessment:   Pt admitted from home with COPD. Pt lives with his wife and will return home at discharge. Pt is active with [facility] RN, PT and aide. Pt has home O2 with [facility] and neb machine.     Action/Plan:   Pt will be set up for cpap with [facility] prior to discharge. Sleep study has been arranged and documented on AVS. Pt also made aware. Will arrange resumption of HH at discharge with [facility].   Anticipated DC Date:  07/18/2014   Anticipated DC Plan:  [place]  CM consult      [facility] Choice  Resumption Of Svcs/PTA Provider   Choice offered to / List presented to:  C-1 Patient   DME arranged  CPAP      DME agency  [place] arranged  HH-1 RN  [place].   Status of service:  Completed, signed off Medicare Important Message given?  YES (If response is "NO", the following Medicare IM given date fields will be blank) Date Medicare IM given:  07/16/2014 Medicare IM given by:  Theophilus Kinds Date Additional Medicare IM given:   Additional Medicare IM given by:    Discharge Disposition:  [place]  Per UR Regulation:    If discussed at Long Length of Stay Meetings, dates discussed:    Comments:  07/16/14 [place], RN BSN CM

## 2014-07-16 NOTE — Consult Note (Signed)
[place] A. Merlene Laughter, MD     [email or web address]          Rick Cruz is an 68 y.o. male.   ASSESSMENT/PLAN:  1. Likely severe obstructive status syndrome worsened with polypharmacy. The combination of long-acting opioids and benzodiazepines has been associated with worsening apnea and sudden death for respiratory suppression. The patient will be placed on auto Pap as we do not have his settings. He tells me that he has been on 12. We'll place him on 12-20. I would suggest that the patient's fentanyl weaned off gradually. The Ativan should also be weaned to a minimum of 0.5 mg a day or completely. He can take as needed doses of the hydrocodone.   The patient is an 68 year old white man who presents with respect her a suppression. He has been in the ICU but was transferred out today. He does have a history of severe obstructive sleep apnea syndrome but has not been using his machine for over a year. Reasons are not given. He tells me that he just did not use it. He has been along acting and short acting opioids for a while. He has been on these for couple years or more. He also has been on Ativan. The doses have not been increased but he tells me that he takes more of the hydrocodone sometimes when he has increased ankle pain. He tells me that he is written for the 5/325 4 times a day as needed. He tells me that he had a sleep study done in [place] at [facility] but I see no documentation of this. The primary was done a couple years ago. The patient does have chronic pain especially involving the joints. The review of systems otherwise negative.  GENERAL: This is a morbidly obese man who appears minimally dyspneic but in no acute distress.  HEENT: Supple. Atraumatic normocephalic. The patient has a very large stocky neck. The patient had a severe circumferential narrowing of the posterior air space with a very large tongue.  ABDOMEN: soft  EXTREMITIES: There is 1+ pitting  edema of the ankles and distal leg. There is mild red discoloration of the distal ankle and crusting of the skin.  BACK: Normal.  SKIN: Normal by inspection.    MENTAL STATUS: Alert and oriented. Speech, language and cognition are generally intact. Judgment and insight normal.   CRANIAL NERVES: Pupils are equal, round and reactive to light and accommodation; extra ocular movements are full, there is no significant nystagmus; visual fields are full; upper and lower facial muscles are normal in strength and symmetric, there is no flattening of the nasolabial folds; tongue is midline; uvula is midline; shoulder elevation is normal.  MOTOR: Normal tone, bulk and strength; no pronator drift.  COORDINATION: Left finger to nose is normal, right finger to nose is normal, No rest tremor; no intention tremor; no postural tremor; no bradykinesia.  SENSATION: Normal to light touch.    Blood pressure 149/72, pulse 79, temperature 97.6 F (36.4 C), temperature source Oral, resp. rate 17, height 6' (1.829 m), weight 134.5 kg (296 lb 8.3 oz), SpO2 93.00%.  Past Medical History  Diagnosis Date  . COPD (chronic obstructive pulmonary disease)   . CHF (congestive heart failure)     diastolic   . Hypercholesterolemia   . Depression   . Schizophrenia   . Sleep apnea     noncompliant with BiPAP  . Tobacco abuse   . On home O2     3  liters  . Chronic pain   . Diabetes mellitus without complication     insulin pump    Past Surgical History  Procedure Laterality Date  . Knee surgery      X2  . 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   . Colonoscopy  July 2011    Dr. Benson Norway: multiple polyps, internal and external hemorrhoids, diverticulosis. tubular adenoma  . Esophagogastroduodenoscopy (egd) with propofol N/A 04/20/2013    Procedure: ESOPHAGOGASTRODUODENOSCOPY (EGD) WITH PROPOFOL;  Surgeon: Daneil Dolin, MD;   Location: [facility];  Service: Endoscopy;  Laterality: N/A;    Family History  Problem Relation Age of Onset  . Colon cancer Neg Hx   . Parkinson's disease    . Thyroid disease Mother   . Parkinson's disease Father     Social History:  reports that he quit smoking about 9 months ago. His smoking use included Cigarettes. He has a 50 pack-year smoking history. He has never used smokeless tobacco. He reports that he does not drink alcohol or use illicit drugs.  Allergies:  Allergies  Allergen Reactions  . Phenergan [Promethazine Hcl] Other (See Comments)    Becomes very confused and aggressive  . Haldol [Haloperidol Lactate] Other (See Comments)    Shaking     Medications: Prior to Admission medications   Medication Sig Start Date End Date Taking? Authorizing Provider  albuterol (PROVENTIL HFA;VENTOLIN HFA) 108 (90 BASE) MCG/ACT inhaler Inhale 2 puffs into the lungs every 6 (six) hours as needed for wheezing or shortness of breath. 05/11/14  Yes Lezlie Octave Black, NP  albuterol (PROVENTIL) (2.5 MG/3ML) 0.083% nebulizer solution Take 3 mLs (2.5 mg total) by nebulization every 6 (six) hours as needed for wheezing or shortness of breath. 05/11/14  Yes Radene Gunning, NP  aspirin 81 MG chewable tablet Chew 81 mg by mouth every morning.    Yes Historical Provider, MD  brimonidine (ALPHAGAN) 0.15 % ophthalmic solution Place 1 drop into the right eye 2 (two) times daily.    Yes Historical Provider, MD  Cinnamon 500 MG capsule Take 1,000 mg by mouth every morning.    Yes Historical Provider, MD  citalopram (CELEXA) 20 MG tablet Take 20 mg by mouth every morning.   Yes Historical Provider, MD  clopidogrel (PLAVIX) 75 MG tablet Take 75 mg by mouth every morning.   Yes Historical Provider, MD  fentaNYL (DURAGESIC - DOSED MCG/HR) 50 MCG/HR Place 1 patch (50 mcg total) onto the skin every 3 (three) days. 05/11/14  Yes Lezlie Octave Black, NP  furosemide (LASIX) 20 MG tablet Take 20 mg by mouth every morning.   Yes  Historical Provider, MD  gabapentin (NEURONTIN) 300 MG capsule Take 300 mg by mouth 3 (three) times daily.   Yes Historical Provider, MD  insulin lispro (HUMALOG) 100 UNIT/ML injection by Pump Prime route continuous.    Yes Historical Provider, MD  LORazepam (ATIVAN) 1 MG tablet Take 1 mg by mouth 2 (two) times daily as needed for anxiety.   Yes Historical Provider, MD  metoprolol (LOPRESSOR) 50 MG tablet Take 50 mg by mouth 2 (two) times daily.   Yes Historical Provider, MD  pantoprazole (PROTONIX) 40 MG tablet Take 40 mg by mouth 2 (two) times daily.   Yes Historical Provider, MD  simvastatin (ZOCOR) 40 MG tablet Take 40 mg by mouth every evening.   Yes Historical Provider, MD  terazosin (HYTRIN)  1 MG capsule Take 1 mg by mouth at bedtime.   Yes Historical Provider, MD  tiotropium (SPIRIVA HANDIHALER) 18 MCG inhalation capsule Place 1 capsule (18 mcg total) into inhaler and inhale every morning. 05/11/14  Yes Radene Gunning, NP  traZODone (DESYREL) 150 MG tablet Take 300 mg by mouth at bedtime.   Yes Historical Provider, MD  triamcinolone cream (KENALOG) 0.1 % Apply 1 application topically daily as needed (for irritation).    Yes Historical Provider, MD  ziprasidone (GEODON) 80 MG capsule Take 80 mg by mouth every evening.   Yes Historical Provider, MD    Scheduled Meds: . antiseptic oral rinse  7 mL Mouth Rinse q12n4p  . chlorhexidine  15 mL Mouth Rinse BID  . citalopram  20 mg Oral q morning - 10a  . clopidogrel  75 mg Oral QAC breakfast  . doxycycline  100 mg Oral Q12H  . [START ON 07/17/2014] fentaNYL  25 mcg Transdermal Q72H  . furosemide  20 mg Oral BID  . gabapentin  300 mg Oral TID  . heparin  5,000 Units Subcutaneous 3 times per day  . insulin aspart  0-5 Units Subcutaneous QHS  . insulin aspart  0-9 Units Subcutaneous TID WC  . insulin aspart  4 Units Subcutaneous TID WC  . insulin glargine  25 Units Subcutaneous Daily  . ipratropium-albuterol  3 mL Nebulization Q4H  .  metoprolol  50 mg Oral BID  . nystatin-triamcinolone   Topical BID  . pantoprazole  40 mg Oral BID  . predniSONE  40 mg Oral QAC breakfast  . simvastatin  40 mg Oral QPM  . sodium chloride  3 mL Intravenous Q12H  . terazosin  1 mg Oral QHS  . traZODone  300 mg Oral QHS  . ziprasidone  80 mg Oral QPM   Continuous Infusions:  PRN Meds:.sodium chloride, LORazepam, ondansetron (ZOFRAN) IV, ondansetron, sodium chloride     Results for orders placed during the hospital encounter of 07/14/14 (from the past 48 hour(s))  BLOOD GAS, ARTERIAL     Status: Abnormal   Collection Time    07/14/14  7:49 PM      Result Value Ref Range   O2 Content 4.5     Delivery systems NASAL CANNULA     pH, Arterial 7.370  7.350 - 7.450   pCO2 arterial 72.4 (*) 35.0 - 45.0 mmHg   Comment: CRITICAL RESULT CALLED TO, READ BACK BY AND VERIFIED WITH:     AMANDA KELLY,RN BY B.STOPHEL RRT,RCP ON 07/14/14 AT 2012   pO2, Arterial 62.3 (*) 80.0 - 100.0 mmHg   Bicarbonate 40.8 (*) 20.0 - 24.0 mEq/L   TCO2 38.2  0 - 100 mmol/L   O2 Saturation 89.8     Collection site RIGHT RADIAL     Drawn by [phone]     Sample type ARTERIAL DRAW     Allens test (pass/fail) PASS  PASS  CBG MONITORING, ED     Status: Abnormal   Collection Time    07/14/14  9:46 PM      Result Value Ref Range   Glucose-Capillary 290 (*) 70 - 99 mg/dL  GLUCOSE, CAPILLARY     Status: Abnormal   Collection Time    07/14/14 10:28 PM      Result Value Ref Range   Glucose-Capillary 304 (*) 70 - 99 mg/dL  MRSA PCR SCREENING     Status: None   Collection Time    07/14/14 10:35 PM  Result Value Ref Range   MRSA by PCR NEGATIVE  NEGATIVE   Comment:            The GeneXpert MRSA Assay (FDA     approved for NASAL specimens     only), is one component of a     comprehensive MRSA colonization     surveillance program. It is not     intended to diagnose MRSA     infection nor to guide or     monitor treatment for     MRSA infections.  GLUCOSE,  CAPILLARY     Status: Abnormal   Collection Time    07/15/14  7:35 AM      Result Value Ref Range   Glucose-Capillary 287 (*) 70 - 99 mg/dL   Comment 1 Documented in Chart     Comment 2 Notify RN    CBC     Status: Abnormal   Collection Time    07/15/14  8:26 AM      Result Value Ref Range   WBC 9.7  4.0 - 10.5 K/uL   RBC 3.80 (*) 4.22 - 5.81 MIL/uL   Hemoglobin 10.2 (*) 13.0 - 17.0 g/dL   HCT 35.3 (*) 39.0 - 52.0 %   MCV 92.9  78.0 - 100.0 fL   MCH 26.8  26.0 - 34.0 pg   MCHC 28.9 (*) 30.0 - 36.0 g/dL   RDW 13.9  11.5 - 15.5 %   Platelets 238  150 - 400 K/uL  BASIC METABOLIC PANEL     Status: Abnormal   Collection Time    07/15/14  8:26 AM      Result Value Ref Range   Sodium 142  137 - 147 mEq/L   Potassium 5.3  3.7 - 5.3 mEq/L   Chloride 96  96 - 112 mEq/L   CO2 40 (*) 19 - 32 mEq/L   Comment: CRITICAL RESULT CALLED TO, READ BACK BY AND VERIFIED WITH:     PHILLIPS,C AT 9:20AM ON 07/15/14 BY FESTERMAN,C   Glucose, Bld 302 (*) 70 - 99 mg/dL   BUN 10  6 - 23 mg/dL   Creatinine, Ser 0.83  0.50 - 1.35 mg/dL   Calcium 9.2  8.4 - 10.5 mg/dL   GFR calc non Af Amer 89 (*) >90 mL/min   GFR calc Af Amer >90  >90 mL/min   Comment: (NOTE)     The eGFR has been calculated using the CKD EPI equation.     This calculation has not been validated in all clinical situations.     eGFR's persistently <90 mL/min signify possible Chronic Kidney     Disease.   Anion gap 6  5 - 15  GLUCOSE, CAPILLARY     Status: Abnormal   Collection Time    07/15/14 11:35 AM      Result Value Ref Range   Glucose-Capillary 337 (*) 70 - 99 mg/dL   Comment 1 Documented in Chart     Comment 2 Notify RN    GLUCOSE, CAPILLARY     Status: Abnormal   Collection Time    07/15/14  4:15 PM      Result Value Ref Range   Glucose-Capillary 306 (*) 70 - 99 mg/dL   Comment 1 Documented in Chart     Comment 2 Notify RN    GLUCOSE, CAPILLARY     Status: Abnormal   Collection Time    07/15/14  8:52 PM  Result  Value Ref Range   Glucose-Capillary 252 (*) 70 - 99 mg/dL  BASIC METABOLIC PANEL     Status: Abnormal   Collection Time    07/16/14  6:46 AM      Result Value Ref Range   Sodium 141  137 - 147 mEq/L   Potassium 4.8  3.7 - 5.3 mEq/L   Chloride 96  96 - 112 mEq/L   CO2 40 (*) 19 - 32 mEq/L   Comment: CRITICAL RESULT CALLED TO, READ BACK BY AND VERIFIED WITH:     STONE,A AT 7:45AM ON 07/16/14 BY FESTERMAN,C   Glucose, Bld 190 (*) 70 - 99 mg/dL   BUN 15  6 - 23 mg/dL   Creatinine, Ser 0.80  0.50 - 1.35 mg/dL   Calcium 8.6  8.4 - 10.5 mg/dL   GFR calc non Af Amer >90  >90 mL/min   GFR calc Af Amer >90  >90 mL/min   Comment: (NOTE)     The eGFR has been calculated using the CKD EPI equation.     This calculation has not been validated in all clinical situations.     eGFR's persistently <90 mL/min signify possible Chronic Kidney     Disease.   Anion gap 5  5 - 15  GLUCOSE, CAPILLARY     Status: Abnormal   Collection Time    07/16/14  7:42 AM      Result Value Ref Range   Glucose-Capillary 187 (*) 70 - 99 mg/dL   Comment 1 Documented in Chart     Comment 2 Notify RN    GLUCOSE, CAPILLARY     Status: Abnormal   Collection Time    07/16/14 11:57 AM      Result Value Ref Range   Glucose-Capillary 226 (*) 70 - 99 mg/dL   Comment 1 Documented in Chart     Comment 2 Notify RN    GLUCOSE, CAPILLARY     Status: Abnormal   Collection Time    07/16/14  4:35 PM      Result Value Ref Range   Glucose-Capillary 283 (*) 70 - 99 mg/dL    Studies/Results: HEAD CT 04-2014   CLINICAL DATA: Left-sided weakness and headache.  EXAM:  CT HEAD WITHOUT CONTRAST  TECHNIQUE:  Contiguous axial images were obtained from the base of the skull  through the vertex without intravenous contrast.  COMPARISON: 01/02/2014  FINDINGS:  Skull and Sinuses:Negative for fracture or destructive process. The  mastoids, middle ears, and imaged paranasal sinuses are clear.  Orbits: Bilateral cataract resection.    Brain: Diagnostic sensitivity decreased by motion, present despite  repeated imaging. This could obscure small or subtle abnormalities.  No evidence of acute abnormality, such as acute infarction,  hemorrhage, hydrocephalus, or mass lesion/mass effect. There is  generalized brain atrophy, advanced for age. Confluent  periventricular white matter low density consistent with chronic  small vessel disease - pattern stable from previous.  IMPRESSION:  1. As permitted by patient motion, no acute intracranial findings.  2. 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   Osiel Stick A. Merlene Laughter, M.D.  Diplomate, Tax adviser of Psychiatry and Neurology ( Neurology). 07/16/2014, 6:43 PM

## 2014-07-17 DIAGNOSIS — J9601 Acute respiratory failure with hypoxia: Secondary | ICD-10-CM | POA: Diagnosis not present

## 2014-07-17 LAB — BASIC METABOLIC PANEL
Anion gap: 7 (ref 5–15)
BUN: 16 mg/dL (ref 6–23)
CO2: 42 mEq/L (ref 19–32)
Calcium: 9.3 mg/dL (ref 8.4–10.5)
Chloride: 93 mEq/L — ABNORMAL LOW (ref 96–112)
Creatinine, Ser: 0.92 mg/dL (ref 0.50–1.35)
GFR, EST NON AFRICAN AMERICAN: 85 mL/min — AB (ref >90)
GLUCOSE: 151 mg/dL — AB (ref 70–99)
POTASSIUM: 4.5 meq/L (ref 3.7–5.3)
SODIUM: 142 meq/L (ref 137–147)

## 2014-07-17 LAB — CBC WITH DIFFERENTIAL/PLATELET
Basophils Absolute: 0 10*3/uL (ref 0.0–0.1)
Basophils Relative: 0 % (ref 0–1)
Eosinophils Absolute: 0.1 10*3/uL (ref 0.0–0.7)
Eosinophils Relative: 1 % (ref 0–5)
HCT: 37.4 % — ABNORMAL LOW (ref 39.0–52.0)
Hemoglobin: 10.5 g/dL — ABNORMAL LOW (ref 13.0–17.0)
LYMPHS ABS: 2 10*3/uL (ref 0.7–4.0)
Lymphocytes Relative: 21 % (ref 12–46)
MCH: 26.2 pg (ref 26.0–34.0)
MCHC: 28.1 g/dL — ABNORMAL LOW (ref 30.0–36.0)
MCV: 93.3 fL (ref 78.0–100.0)
Monocytes Absolute: 0.8 10*3/uL (ref 0.1–1.0)
Monocytes Relative: 8 % (ref 3–12)
NEUTROS ABS: 6.8 10*3/uL (ref 1.7–7.7)
NEUTROS PCT: 70 % (ref 43–77)
PLATELETS: 210 10*3/uL (ref 150–400)
RBC: 4.01 MIL/uL — AB (ref 4.22–5.81)
RDW: 14.1 % (ref 11.5–15.5)
WBC: 9.6 10*3/uL (ref 4.0–10.5)

## 2014-07-17 LAB — GLUCOSE, CAPILLARY
GLUCOSE-CAPILLARY: 141 mg/dL — AB (ref 70–99)
GLUCOSE-CAPILLARY: 164 mg/dL — AB (ref 70–99)

## 2014-07-17 MED ORDER — FENTANYL 12 MCG/HR TD PT72: 12.5000 ug | MEDICATED_PATCH | TRANSDERMAL | Status: DC

## 2014-07-17 MED ORDER — FENTANYL 12 MCG/HR TD PT72
12.5000 ug | MEDICATED_PATCH | TRANSDERMAL | Status: DC
  Administered 2014-07-17: 12.5 ug via TRANSDERMAL
  Filled 2014-07-17: qty 1

## 2014-07-17 MED ORDER — INFLUENZA VAC SPLIT QUAD 0.5 ML IM SUSY
0.5000 mL | PREFILLED_SYRINGE | Freq: Once | INTRAMUSCULAR | Status: AC
  Administered 2014-07-17: 0.5 mL via INTRAMUSCULAR
  Filled 2014-07-17: qty 0.5

## 2014-07-17 MED ORDER — LORAZEPAM 1 MG PO TABS: 0.5000 mg | ORAL_TABLET | Freq: Two times a day (BID) | ORAL | Status: DC | PRN

## 2014-07-17 MED ORDER — LORAZEPAM 0.5 MG PO TABS
0.5000 mg | ORAL_TABLET | Freq: Two times a day (BID) | ORAL | Status: DC | PRN
  Administered 2014-07-17: 0.5 mg via ORAL
  Filled 2014-07-17: qty 1

## 2014-07-17 MED ORDER — PREDNISONE 20 MG PO TABS: 40.0000 mg | ORAL_TABLET | Freq: Every day | ORAL | Status: DC

## 2014-07-17 NOTE — Progress Notes (Signed)
CRITICAL VALUE ALERT  Critical value received:  CO2 42  Date of notification:  07/17/2014  Time of notification:  0730  Critical value read back: yes  Nurse who received alert:  T.James, RN  MD notified (1st page):  Samtani  Time of first page:  0730  MD notified (2nd page):  Time of second page:  Responding MD:  Verlon Au.No new orders at this time.

## 2014-07-17 NOTE — Progress Notes (Signed)
Rick Cruz discharged home with home health (notified [place] about patients discharge) per MD order.  Discharge instructions reviewed and discussed with the patient, all questions and concerns answered. Copy of instructions and scripts given to patient.    Medication List    STOP taking these medications       fentaNYL 50 MCG/HR  Commonly known as:  DURAGESIC - dosed mcg/hr  Replaced by:  fentaNYL 12 MCG/HR      TAKE these medications       albuterol 108 (90 BASE) MCG/ACT inhaler  Commonly known as:  PROVENTIL HFA;VENTOLIN HFA  Inhale 2 puffs into the lungs every 6 (six) hours as needed for wheezing or shortness of breath.     albuterol (2.5 MG/3ML) 0.083% nebulizer solution  Commonly known as:  PROVENTIL  Take 3 mLs (2.5 mg total) by nebulization every 6 (six) hours as needed for wheezing or shortness of breath.     aspirin 81 MG chewable tablet  Chew 81 mg by mouth every morning.     brimonidine 0.15 % ophthalmic solution  Commonly known as:  ALPHAGAN  Place 1 drop into the right eye 2 (two) times daily.     Cinnamon 500 MG capsule  Take 1,000 mg by mouth every morning.     citalopram 20 MG tablet  Commonly known as:  CELEXA  Take 20 mg by mouth every morning.     clopidogrel 75 MG tablet  Commonly known as:  PLAVIX  Take 75 mg by mouth every morning.     fentaNYL 12 MCG/HR  Commonly known as:  DURAGESIC - dosed mcg/hr  Place 1 patch (12.5 mcg total) onto the skin every 3 (three) days.     furosemide 20 MG tablet  Commonly known as:  LASIX  Take 20 mg by mouth every morning.     gabapentin 300 MG capsule  Commonly known as:  NEURONTIN  Take 300 mg by mouth 3 (three) times daily.     insulin lispro 100 UNIT/ML injection  Commonly known as:  HUMALOG  by Pump Prime route continuous.     LORazepam 1 MG tablet  Commonly known as:  ATIVAN  Take 0.5 tablets (0.5 mg total) by mouth 2 (two) times daily as needed for anxiety.     metoprolol 50 MG  tablet  Commonly known as:  LOPRESSOR  Take 50 mg by mouth 2 (two) times daily.     pantoprazole 40 MG tablet  Commonly known as:  PROTONIX  Take 40 mg by mouth 2 (two) times daily.     predniSONE 20 MG tablet  Commonly known as:  DELTASONE  Take 2 tablets (40 mg total) by mouth daily before breakfast.     simvastatin 40 MG tablet  Commonly known as:  ZOCOR  Take 40 mg by mouth every evening.     terazosin 1 MG capsule  Commonly known as:  HYTRIN  Take 1 mg by mouth at bedtime.     tiotropium 18 MCG inhalation capsule  Commonly known as:  SPIRIVA HANDIHALER  Place 1 capsule (18 mcg total) into inhaler and inhale every morning.     traZODone 150 MG tablet  Commonly known as:  DESYREL  Take 300 mg by mouth at bedtime.     triamcinolone cream 0.1 %  Commonly known as:  KENALOG  Apply 1 application topically daily as needed (for irritation).     ziprasidone 80 MG capsule  Commonly known as:  GEODON  Take 80 mg by mouth every evening.         IV site discontinued and catheter remains intact. Site without signs and symptoms of complications. Dressing and pressure applied.  Patient escorted to car by Rick Cruz, NT in a wheelchair,  no distress noted upon discharge.  Rick Cruz 07/17/2014 11:28 AM

## 2014-07-17 NOTE — Discharge Summary (Signed)
Physician Discharge Summary  Rick Cruz [id number] DOB: 1945/11/28 DOA: 07/14/2014  PCP: [name], MD  Admit date: 07/14/2014 Discharge date: 07/17/2014  Time spent: 40 minutes  Recommendations for Outpatient Follow-up:  1. Discontinue eventually your long-acting opioids and ABCDs eventually we have already started to taper. He may take his short-acting 2. Patient will need CPAP/BiPAP. His settings on discharge on BiPAP 12 over 5 daily at bedtime. He has a sleep study scheduled for 06/19/14 3. Patient needs further education regarding insulin pump and has beenmisconceptions with how he is supposed to use it and or understanding of some of the functions of pump. He sees Darl Householder for this and will need to see her in about 4-5 days to re-assess his understanding.  She spoke to him during hospital stay 4.  Recommend 3 more days of prednisone. He was initially on doxycycline injuring this admission which we discontinued 5. Recommend cardiology follow-up as BNP was 200 and he had some fluid in his lungs on admission so may have a component of heart failure  Discharge Diagnoses:  Active Problems:   COPD (chronic obstructive pulmonary disease)   Discharge Condition: Improved  Diet recommendation: Diabetic heart healthy  Filed Weights   07/14/14 2238 07/14/14 2247 07/16/14 0500  Weight: 137 kg (302 lb 0.5 oz) 137 kg (302 lb 0.5 oz) 134.5 kg (296 lb 8.3 oz)    History of present illness:  68 y/o ? OSA noncompliant CPAP [basleine DOE on crossing room]> COPD[mild obs disease 2/2 emphysema vs asthmatic bronchitis], Obesity, chronic pain syndrome, DM ty 2, schizophrenia, h/o Tubulovillous Cecum adenoma, HLD admitted weith 2-3 day h/o increasing SOB and [facility] Hospital Course:   1. Acute hypoxic respiratory failure in noncompliant patient supposed to use CPAP-hypoxia likely secondary to multiple chronic narcotic medications which may have decreased his respiratory drive and cause  significant air trapping.also non-complioant with bipapa and had to return his BiPAP machine because of noncompliance. Case management has set up a sleep study 10/3  In the interim he may be able to get CPAP at home on discharge 2. Stable COPD. No wheeze no rails or rhonchi. Transitioned from IV Solu-Medrol to by mouth prednisone for 3 days although it do not think there is an acute component to this. Continue duo nebs every 4 hourly and monitor. 3. Chronic pain syndrome. Patient will need to have a discussion with his regular physician about his medications.Neurology saw the patient and concurred that he may need to come off some of his long-acting medications of His fentanyl and 25--12.5 mg and have have his Ativan from 1 mg to 0.5 mg twice a day. He can continue his oxycodone without change.  4. Type 2 diabetes mellitus on insulin pump-patient has some confusion about his medication.during hospital stay was on lantus 25 units and 3 times a day insulin for coverage. Diabetic coordinator has spoken with him at length about some of the functions of his pump and he will need further follow-up as an outpatient. He should resume his pump on discharge-blood sugarsduring hospital stay were as high as 300 but they were well controlled  5. Schizophrenia-continue citalopram 20 mg every morning, trazodone 300 every afternoon, Geodon 80 daily p.m. May need to cut back dosing and make sure that this does not affect his sensorium.Ativan has been cut back as above  6. Acute decompensated diastolic CHF-chest x-ray two-view 10/20 shows vascular congestion, BNP 200. We will start low doses of Lasix 20 mg by mouth  twice a day and he will need close outpatient follow-up he may need to be seen by cardiology His echocardiogram was done on this admission 10/29 showed indeterminant diastolic dysfunction [id number]  7. Hyperkalemia-unclear etiology. A.m. labs are better 8. Hyperlipidemia continue Zocor 40 mg every  afternoon 9. Hypertension-continue metoprolol 20 mg twice a day 10. BPH continue terazosin 1 mg daily at bedtime 11. Polypharmacy he is on numerous antidepressants as well as gabapentin all of which can confuse him.See above discussion   Consultants:  Neurology Procedures:  Chest x-ray Antibiotics:  Doxycycline 10/28--> 10/31  Discharge Exam: Filed Vitals:   07/17/14 0542  BP: 117/63  Pulse: 81  Temp: 98.1 F (36.7 C)  Resp: 20    General: Alert pleasant oriented no distress  Cardiovascular: S1-S2 no murmur or gallop telemetry benign  Respiratory: Clinically clear  Discharge Instructions You were cared for by a hospitalist during your hospital stay. If you have any questions about your discharge medications or the care you received while you were in the hospital after you are discharged, you can call the unit and asked to speak with the hospitalist on call if the hospitalist that took care of you is not available. Once you are discharged, your primary care physician will handle any further medical issues. Please note that NO REFILLS for any discharge medications will be authorized once you are discharged, as it is imperative that you return to your primary care physician (or establish a relationship with a primary care physician if you do not have one) for your aftercare needs so that they can reassess your need for medications and monitor your lab values.  Discharge Instructions   Diet - low sodium heart healthy    Complete by:  As directed      Discharge instructions    Complete by:  As directed   You were diagnosed with respiratory failure secondary to multiple reasons-the main one being multiple medications secondary to see her respiratory drive. I've recommended that he cut back on her fentanyl from 25- 12.5 mg every 3 days and you will need to cut back on the Ativan from 1 mg twice daily to half milligrams. Eventually these medications should be discontinued if we can get your  pain control with non-pharmacological modalities such as weight loss, exercise and physical therapy. I have also recommended that you continue oral steroids maybe for 3 more days. I did not think you had a COPD exacerbation or an infection Most importantly should continue wearing her seat. Your BiPAP settings on discharge overnight were 12/5 and you have been set up with a sleep study. In addition he will need close follow-up with the primary care physician to discuss her medications and other issues. Please make sure he learned how to use your insulin pump appropriately as blood sugars were elevated on admission and he needed E on long-acting insulin. You will need a firm handling of this is diabetes can have untoward effects and you can be hospitalized for complications of. You may continue your other medications without change Best of luck     Increase activity slowly    Complete by:  As directed           Current Discharge Medication List    START taking these medications   Details  predniSONE (DELTASONE) 20 MG tablet Take 2 tablets (40 mg total) by mouth daily before breakfast. Qty: 6 tablet, Refills: 0      CONTINUE these medications which have CHANGED  Details  fentaNYL (DURAGESIC - DOSED MCG/HR) 12 MCG/HR Place 1 patch (12.5 mcg total) onto the skin every 3 (three) days. Qty: 5 patch, Refills: 0    LORazepam (ATIVAN) 1 MG tablet Take 0.5 tablets (0.5 mg total) by mouth 2 (two) times daily as needed for anxiety. Qty: 30 tablet, Refills: 0      CONTINUE these medications which have NOT CHANGED   Details  albuterol (PROVENTIL HFA;VENTOLIN HFA) 108 (90 BASE) MCG/ACT inhaler Inhale 2 puffs into the lungs every 6 (six) hours as needed for wheezing or shortness of breath. Qty: 1 Inhaler, Refills: 2    albuterol (PROVENTIL) (2.5 MG/3ML) 0.083% nebulizer solution Take 3 mLs (2.5 mg total) by nebulization every 6 (six) hours as needed for wheezing or shortness of breath. Qty: 75  mL, Refills: 0    aspirin 81 MG chewable tablet Chew 81 mg by mouth every morning.     brimonidine (ALPHAGAN) 0.15 % ophthalmic solution Place 1 drop into the right eye 2 (two) times daily.     Cinnamon 500 MG capsule Take 1,000 mg by mouth every morning.     citalopram (CELEXA) 20 MG tablet Take 20 mg by mouth every morning.    clopidogrel (PLAVIX) 75 MG tablet Take 75 mg by mouth every morning.    furosemide (LASIX) 20 MG tablet Take 20 mg by mouth every morning.    gabapentin (NEURONTIN) 300 MG capsule Take 300 mg by mouth 3 (three) times daily.    insulin lispro (HUMALOG) 100 UNIT/ML injection by Pump Prime route continuous.     metoprolol (LOPRESSOR) 50 MG tablet Take 50 mg by mouth 2 (two) times daily.    pantoprazole (PROTONIX) 40 MG tablet Take 40 mg by mouth 2 (two) times daily.    simvastatin (ZOCOR) 40 MG tablet Take 40 mg by mouth every evening.    terazosin (HYTRIN) 1 MG capsule Take 1 mg by mouth at bedtime.    tiotropium (SPIRIVA HANDIHALER) 18 MCG inhalation capsule Place 1 capsule (18 mcg total) into inhaler and inhale every morning. Qty: 30 capsule, Refills: 12    traZODone (DESYREL) 150 MG tablet Take 300 mg by mouth at bedtime.    triamcinolone cream (KENALOG) 0.1 % Apply 1 application topically daily as needed (for irritation).     ziprasidone (GEODON) 80 MG capsule Take 80 mg by mouth every evening.      STOP taking these medications     fentaNYL (DURAGESIC - DOSED MCG/HR) 50 MCG/HR        Allergies  Allergen Reactions  . Phenergan [Promethazine Hcl] Other (See Comments)    Becomes very confused and aggressive  . Haldol [Haloperidol Lactate] Other (See Comments)    Shaking    Follow-up Information   Follow up On 07/20/2014. (at 8:00 pm)    Contact information:   [facility]        The results of significant diagnostics from this hospitalization (including imaging, microbiology, ancillary and laboratory) are listed below for  reference.    Significant Diagnostic Studies: Dg Chest 2 View  07/14/2014   CLINICAL DATA:  Severe shortness of Breath today. History of COPD, diabetes CHF. Former smoker.  EXAM: CHEST  2 VIEW  COMPARISON:  05/08/2014  FINDINGS: There is hyperinflation of the lungs compatible with COPD. Small bilateral pleural effusions are noted. No confluent airspace opacities. Heart is borderline in size with vascular congestion. No overt edema.  IMPRESSION: COPD.  Borderline cardiomegaly with vascular congestion.  Small effusions.   Electronically Signed   By: Rolm Baptise M.D.   On: 07/14/2014 17:54    Microbiology: Recent Results (from the past 240 hour(s))  MRSA PCR SCREENING     Status: None   Collection Time    07/14/14 10:35 PM      Result Value Ref Range Status   MRSA by PCR NEGATIVE  NEGATIVE Final   Comment:            The GeneXpert MRSA Assay (FDA     approved for NASAL specimens     only), is one component of a     comprehensive MRSA colonization     surveillance program. It is not     intended to diagnose MRSA     infection nor to guide or     monitor treatment for     MRSA infections.     Labs: Basic Metabolic Panel:  Recent Labs Lab 07/14/14 1830 07/15/14 0826 07/16/14 0646 07/17/14 0600  NA 139 142 141 142  K 5.4* 5.3 4.8 4.5  CL 91* 96 96 93*  CO2 40* 40* 40* 42*  GLUCOSE 349* 302* 190* 151*  BUN 10 10 15 16   CREATININE 1.02 0.83 0.80 0.92  CALCIUM 9.0 9.2 8.6 9.3   Liver Function Tests:  Recent Labs Lab 07/14/14 1830  AST 9  ALT 9  ALKPHOS 103  BILITOT 0.3  PROT 6.5  ALBUMIN 3.3*   No results found for this basename: LIPASE, AMYLASE,  in the last 168 hours No results found for this basename: AMMONIA,  in the last 168 hours CBC:  Recent Labs Lab 07/14/14 1830 07/15/14 0826 07/17/14 0600  WBC 12.2* 9.7 9.6  NEUTROABS 10.2*  --  6.8  HGB 10.5* 10.2* 10.5*  HCT 37.5* 35.3* 37.4*  MCV 95.2 92.9 93.3  PLT 246 238 210   Cardiac Enzymes:  Recent  Labs Lab 07/14/14 1830  TROPONINI <0.30   BNP: BNP (last 3 results)  Recent Labs  01/02/14 0935 07/14/14 1830  PROBNP 229.0* 211.1*   CBG:  Recent Labs Lab 07/16/14 0742 07/16/14 1157 07/16/14 1635 07/16/14 2001 07/17/14 0727  GLUCAP 187* 226* 283* 279* 141*       Signed:  Nita Sells  [facility] 07/17/2014, 9:15 AM

## 2014-07-20 ENCOUNTER — Ambulatory Visit: Payer: Medicare Other | Attending: Internal Medicine | Admitting: Sleep Medicine

## 2014-07-20 ENCOUNTER — Emergency Department (HOSPITAL_COMMUNITY)
Admission: EM | Admit: 2014-07-20 | Discharge: 2014-07-21 | Disposition: A | Discharge status: Home / Self Care | Payer: Medicare Other | Attending: Emergency Medicine | Admitting: Emergency Medicine

## 2014-07-20 ENCOUNTER — Encounter (HOSPITAL_COMMUNITY): Payer: Self-pay | Admitting: Emergency Medicine

## 2014-07-20 DIAGNOSIS — R404 Transient alteration of awareness: Secondary | ICD-10-CM

## 2014-07-20 DIAGNOSIS — R0683 Snoring: Secondary | ICD-10-CM

## 2014-07-20 DIAGNOSIS — Z794 Long term (current) use of insulin: Secondary | ICD-10-CM | POA: Insufficient documentation

## 2014-07-20 DIAGNOSIS — E119 Type 2 diabetes mellitus without complications: Secondary | ICD-10-CM | POA: Insufficient documentation

## 2014-07-20 DIAGNOSIS — Z9119 Patient's noncompliance with other medical treatment and regimen: Secondary | ICD-10-CM | POA: Insufficient documentation

## 2014-07-20 DIAGNOSIS — E780 Pure hypercholesterolemia: Secondary | ICD-10-CM | POA: Diagnosis not present

## 2014-07-20 DIAGNOSIS — F209 Schizophrenia, unspecified: Secondary | ICD-10-CM | POA: Insufficient documentation

## 2014-07-20 DIAGNOSIS — Z7982 Long term (current) use of aspirin: Secondary | ICD-10-CM | POA: Insufficient documentation

## 2014-07-20 DIAGNOSIS — Z7952 Long term (current) use of systemic steroids: Secondary | ICD-10-CM | POA: Diagnosis not present

## 2014-07-20 DIAGNOSIS — G4730 Sleep apnea, unspecified: Secondary | ICD-10-CM | POA: Diagnosis not present

## 2014-07-20 DIAGNOSIS — Z79899 Other long term (current) drug therapy: Secondary | ICD-10-CM | POA: Insufficient documentation

## 2014-07-20 DIAGNOSIS — Z9981 Dependence on supplemental oxygen: Secondary | ICD-10-CM | POA: Insufficient documentation

## 2014-07-20 DIAGNOSIS — T50905A Adverse effect of unspecified drugs, medicaments and biological substances, initial encounter: Secondary | ICD-10-CM

## 2014-07-20 DIAGNOSIS — J441 Chronic obstructive pulmonary disease with (acute) exacerbation: Secondary | ICD-10-CM | POA: Diagnosis not present

## 2014-07-20 DIAGNOSIS — Z7902 Long term (current) use of antithrombotics/antiplatelets: Secondary | ICD-10-CM | POA: Diagnosis not present

## 2014-07-20 DIAGNOSIS — G8929 Other chronic pain: Secondary | ICD-10-CM | POA: Insufficient documentation

## 2014-07-20 DIAGNOSIS — I5030 Unspecified diastolic (congestive) heart failure: Secondary | ICD-10-CM | POA: Diagnosis not present

## 2014-07-20 DIAGNOSIS — Z87891 Personal history of nicotine dependence: Secondary | ICD-10-CM | POA: Diagnosis not present

## 2014-07-20 DIAGNOSIS — F329 Major depressive disorder, single episode, unspecified: Secondary | ICD-10-CM | POA: Insufficient documentation

## 2014-07-20 DIAGNOSIS — R0902 Hypoxemia: Secondary | ICD-10-CM

## 2014-07-20 LAB — CBG MONITORING, ED: GLUCOSE-CAPILLARY: 88 mg/dL (ref 70–99)

## 2014-07-20 NOTE — ED Notes (Signed)
Patient was sent from sleep study due to low O2 sats during study.  Patient has COPD and technician told AC that O2 sats was in the mid-upper 80s.  Patient A&O; speaking coherently.

## 2014-07-20 NOTE — ED Provider Notes (Signed)
CSN: [id number]     Arrival date & time 07/20/14  2327 History  This chart was scribe for Julianne Rice, MD by Judithann Sauger, ED Scribe. The patient was seen in room APA11/APA11 and the patient's care was started at 12:14 AM.    No chief complaint on file.   The history is provided by the patient. No language interpreter was used.   HPI Comments: Rick Cruz is a 68 y.o. male with a history of COPD who presents to the Emergency Department complaining of excessive drowsiness and not responding to an questions. This is a level 5 caveat. He is currently on chronic o2  (3 mL,  99-100%). He is transferred from outside facility where he was having a sleep study performed. Noted to have mild hypoxia and decreased arousability.  Past Medical History  Diagnosis Date  . COPD (chronic obstructive pulmonary disease)   . CHF (congestive heart failure)     diastolic   . Hypercholesterolemia   . Depression   . Schizophrenia   . Sleep apnea     noncompliant with BiPAP  . Tobacco abuse   . On home O2     3 liters  . Chronic pain   . Diabetes mellitus without complication     insulin pump   Past Surgical History  Procedure Laterality Date  . Knee surgery      X2  . 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   . Colonoscopy  July 2011    Dr. Benson Norway: multiple polyps, internal and external hemorrhoids, diverticulosis. tubular adenoma  . Esophagogastroduodenoscopy (egd) with propofol N/A 04/20/2013    Procedure: ESOPHAGOGASTRODUODENOSCOPY (EGD) WITH PROPOFOL;  Surgeon: Daneil Dolin, MD;  Location: [facility];  Service: Endoscopy;  Laterality: N/A;   Family History  Problem Relation Age of Onset  . Colon cancer Neg Hx   . Parkinson's disease    . Thyroid disease Mother   . Parkinson's disease Father    History  Substance Use Topics  . Smoking status: Former Smoker -- 1.00 packs/day for 50 years    Types:  Cigarettes    Quit date: 09/16/2013  . Smokeless tobacco: Never Used  . Alcohol Use: No     Comment: history of ETOH abuse in remote past, none in at least 10 years    Review of Systems  Unable to perform ROS: Mental status change      Allergies  Phenergan and Haldol  Home Medications   Prior to Admission medications   Medication Sig Start Date End Date Taking? Authorizing Provider  albuterol (PROVENTIL HFA;VENTOLIN HFA) 108 (90 BASE) MCG/ACT inhaler Inhale 2 puffs into the lungs every 6 (six) hours as needed for wheezing or shortness of breath. 05/11/14  Yes Lezlie Octave Black, NP  albuterol (PROVENTIL) (2.5 MG/3ML) 0.083% nebulizer solution Take 3 mLs (2.5 mg total) by nebulization every 6 (six) hours as needed for wheezing or shortness of breath. 05/11/14  Yes Radene Gunning, NP  aspirin 81 MG chewable tablet Chew 81 mg by mouth every morning.    Yes Historical Provider, MD  brimonidine (ALPHAGAN) 0.15 % ophthalmic solution Place 1 drop into the right eye 2 (two) times daily.    Yes Historical Provider, MD  Cinnamon 500 MG capsule Take 1,000 mg by mouth every morning.    Yes Historical Provider, MD  citalopram (CELEXA) 20 MG tablet Take 20 mg by mouth every  morning.   Yes Historical Provider, MD  clopidogrel (PLAVIX) 75 MG tablet Take 75 mg by mouth every morning.   Yes Historical Provider, MD  fentaNYL (DURAGESIC - DOSED MCG/HR) 12 MCG/HR Place 1 patch (12.5 mcg total) onto the skin every 3 (three) days. 07/17/14  Yes Nita Sells, MD  furosemide (LASIX) 20 MG tablet Take 20 mg by mouth every morning.   Yes Historical Provider, MD  gabapentin (NEURONTIN) 300 MG capsule Take 300 mg by mouth 3 (three) times daily.   Yes Historical Provider, MD  insulin lispro (HUMALOG) 100 UNIT/ML injection by Pump Prime route continuous.    Yes Historical Provider, MD  LORazepam (ATIVAN) 1 MG tablet Take 0.5 tablets (0.5 mg total) by mouth 2 (two) times daily as needed for anxiety. 07/17/14  Yes  Nita Sells, MD  metoprolol (LOPRESSOR) 50 MG tablet Take 50 mg by mouth 2 (two) times daily.   Yes Historical Provider, MD  pantoprazole (PROTONIX) 40 MG tablet Take 40 mg by mouth 2 (two) times daily.   Yes Historical Provider, MD  predniSONE (DELTASONE) 20 MG tablet Take 2 tablets (40 mg total) by mouth daily before breakfast. 07/17/14  Yes Nita Sells, MD  simvastatin (ZOCOR) 40 MG tablet Take 40 mg by mouth every evening.   Yes Historical Provider, MD  terazosin (HYTRIN) 1 MG capsule Take 1 mg by mouth at bedtime.   Yes Historical Provider, MD  tiotropium (SPIRIVA HANDIHALER) 18 MCG inhalation capsule Place 1 capsule (18 mcg total) into inhaler and inhale every morning. 05/11/14  Yes Radene Gunning, NP  traZODone (DESYREL) 150 MG tablet Take 300 mg by mouth at bedtime.   Yes Historical Provider, MD  triamcinolone cream (KENALOG) 0.1 % Apply 1 application topically daily as needed (for irritation).    Yes Historical Provider, MD  ziprasidone (GEODON) 80 MG capsule Take 80 mg by mouth every evening.   Yes Historical Provider, MD   BP 131/66 mmHg  Pulse 76  Temp(Src) 97.6 F (36.4 C) (Oral)  Resp 18  Ht 6' (1.829 m)  Wt 290 lb (131.543 kg)  BMI 39.32 kg/m2  SpO2 97% Physical Exam  Constitutional: He appears well-developed and well-nourished. No distress.  Very drowsy and difficult to arouse. Appears to be protecting airway.  HENT:  Head: Normocephalic and atraumatic.  Mouth/Throat: Oropharynx is clear and moist.  Eyes: EOM are normal. Pupils are equal, round, and reactive to light.  Neck: Normal range of motion. Neck supple.  Cardiovascular: Normal rate and regular rhythm.   Pulmonary/Chest: Effort normal. No respiratory distress. He has wheezes (mild expiratory wheezing). He has no rales.  Abdominal: Soft. Bowel sounds are normal. He exhibits no distension and no mass. There is no tenderness. There is no rebound and no guarding.  Musculoskeletal: Normal range of  motion. He exhibits no edema or tenderness.  No calf swelling or tenderness.  Neurological:  Patient is very sedated. Not following commands. Difficult to assess neuro status. Will arouse to stimuli but quickly falls back to sleep.  Skin: Skin is warm and dry. No rash noted. No erythema.  Nursing note and vitals reviewed.   ED Course  Procedures (including critical care time) DIAGNOSTIC STUDIES: Oxygen Saturation is 97% on [place], adequate by my interpretation.     Labs Review Labs Reviewed  CBG MONITORING, ED    Imaging Review No results found.   EKG Interpretation None      MDM   Final diagnoses:  None  I personally performed the services described in this documentation, which was scribed in my presence. The recorded information has been reviewed and is accurate.  Patient is now much more alert. He does have some mild slurring of speech. He states he took gabapentin, Ativan,any narcotic pain medication before going for the sleep study. Believe this is the reason for his decreased mental status on presentation. Patient has been satting in the high 90s on 2-3 L of oxygen while in the emergency department. His laboratory values appear to be at their baseline. The patient be discharged home with family member. He is advised to be careful about taking multiple sedating medications. He will rearrange sleep study as an outpatient. Return precautions given.  Julianne Rice, MD 07/21/14 [phone]

## 2014-07-21 ENCOUNTER — Emergency Department (HOSPITAL_COMMUNITY): Payer: Medicare Other

## 2014-07-21 LAB — BLOOD GAS, ARTERIAL
Acid-Base Excess: 10.8 mmol/L — ABNORMAL HIGH (ref 0.0–2.0)
BICARBONATE: 37 meq/L — AB (ref 20.0–24.0)
Drawn by: 23534
O2 Content: 3 L/min
O2 SAT: 95.4 %
PCO2 ART: 73.5 mmHg — AB (ref 35.0–45.0)
PO2 ART: 81.1 mmHg (ref 80.0–100.0)
Patient temperature: 37
TCO2: 34.3 mmol/L (ref 0–100)
pH, Arterial: 7.322 — ABNORMAL LOW (ref 7.350–7.450)

## 2014-07-21 LAB — CBC WITH DIFFERENTIAL/PLATELET
BASOS ABS: 0 10*3/uL (ref 0.0–0.1)
Basophils Relative: 0 % (ref 0–1)
EOS ABS: 1.4 10*3/uL — AB (ref 0.0–0.7)
EOS PCT: 10 % — AB (ref 0–5)
HCT: 39 % (ref 39.0–52.0)
Hemoglobin: 11.2 g/dL — ABNORMAL LOW (ref 13.0–17.0)
Lymphocytes Relative: 15 % (ref 12–46)
Lymphs Abs: 2 10*3/uL (ref 0.7–4.0)
MCH: 26.9 pg (ref 26.0–34.0)
MCHC: 28.7 g/dL — ABNORMAL LOW (ref 30.0–36.0)
MCV: 93.5 fL (ref 78.0–100.0)
Monocytes Absolute: 0.7 10*3/uL (ref 0.1–1.0)
Monocytes Relative: 5 % (ref 3–12)
Neutro Abs: 9.5 10*3/uL — ABNORMAL HIGH (ref 1.7–7.7)
Neutrophils Relative %: 70 % (ref 43–77)
PLATELETS: 161 10*3/uL (ref 150–400)
RBC: 4.17 MIL/uL — ABNORMAL LOW (ref 4.22–5.81)
RDW: 14.1 % (ref 11.5–15.5)
WBC: 13.7 10*3/uL — ABNORMAL HIGH (ref 4.0–10.5)

## 2014-07-21 LAB — RAPID URINE DRUG SCREEN, HOSP PERFORMED
AMPHETAMINES: NOT DETECTED
Barbiturates: NOT DETECTED
Benzodiazepines: NOT DETECTED
Cocaine: NOT DETECTED
OPIATES: NOT DETECTED
Tetrahydrocannabinol: NOT DETECTED

## 2014-07-21 LAB — COMPREHENSIVE METABOLIC PANEL
ALT: 9 U/L (ref 0–53)
AST: 9 U/L (ref 0–37)
Albumin: 3.2 g/dL — ABNORMAL LOW (ref 3.5–5.2)
Alkaline Phosphatase: 91 U/L (ref 39–117)
Anion gap: 7 (ref 5–15)
BUN: 9 mg/dL (ref 6–23)
CALCIUM: 9.2 mg/dL (ref 8.4–10.5)
CO2: 39 mEq/L — ABNORMAL HIGH (ref 19–32)
Chloride: 98 mEq/L (ref 96–112)
Creatinine, Ser: 1.05 mg/dL (ref 0.50–1.35)
GFR calc Af Amer: 83 mL/min — ABNORMAL LOW (ref >90)
GFR calc non Af Amer: 71 mL/min — ABNORMAL LOW (ref >90)
Glucose, Bld: 89 mg/dL (ref 70–99)
Potassium: 4.4 mEq/L (ref 3.7–5.3)
SODIUM: 144 meq/L (ref 137–147)
Total Bilirubin: 0.3 mg/dL (ref 0.3–1.2)
Total Protein: 6.6 g/dL (ref 6.0–8.3)

## 2014-07-21 LAB — URINALYSIS, ROUTINE W REFLEX MICROSCOPIC
Bilirubin Urine: NEGATIVE
GLUCOSE, UA: NEGATIVE mg/dL
HGB URINE DIPSTICK: NEGATIVE
Ketones, ur: NEGATIVE mg/dL
LEUKOCYTES UA: NEGATIVE
Nitrite: NEGATIVE
Protein, ur: NEGATIVE mg/dL
UROBILINOGEN UA: 0.2 mg/dL (ref 0.0–1.0)
pH: 6.5 (ref 5.0–8.0)

## 2014-07-21 LAB — ETHANOL

## 2014-07-21 LAB — TROPONIN I

## 2014-07-21 LAB — PRO B NATRIURETIC PEPTIDE: PRO B NATRI PEPTIDE: 99.9 pg/mL (ref 0–125)

## 2014-07-21 MED ORDER — METHYLPREDNISOLONE SODIUM SUCC 125 MG IJ SOLR
125.0000 mg | Freq: Once | INTRAMUSCULAR | Status: AC
  Administered 2014-07-21: 125 mg via INTRAVENOUS
  Filled 2014-07-21: qty 2

## 2014-07-21 MED ORDER — ALBUTEROL SULFATE (2.5 MG/3ML) 0.083% IN NEBU
5.0000 mg | INHALATION_SOLUTION | Freq: Once | RESPIRATORY_TRACT | Status: AC
  Administered 2014-07-21: 5 mg via RESPIRATORY_TRACT
  Filled 2014-07-21: qty 6

## 2014-07-21 NOTE — ED Notes (Signed)
CRITICAL VALUE ALERT  Critical value received:  abg ph 7.32 co2 75.5 po2 81.1 bicarb 37  Date of notification:  07/21/14  Time of notification:  0118  Critical value read back:Yes.    Nurse who received alert:  Joellyn Rued, Rn  MD notified (1st page):  Dr. Lita Mains  Time of first page:  0118  MD notified (2nd page):  Time of second page:  Responding MD:  Dr. Lita Mains  Time MD responded:  [phone]

## 2014-07-21 NOTE — Discharge Instructions (Signed)
Polypharmacy Problems Polypharmacy problems can occur when you take more than one medicine. Your caregivers need to know about all the medicines you take. This includes vitamins, herbs, eyedrops, over-the-counter medicines, prescription medicines, and creams. Drug interaction problems can include:  Bad reactions or side effects. This can occur when certain drugs are taken together.  Reduced benefit from your medicines. This can occur if one drug reduces the ability of another drug to work. Some drug interaction problems can be life-threatening. Your caregivers can coordinate a plan to help you avoid polypharmacy problems. RISK FACTORS Polypharmacy problems are more likely to occur if:  You have many caregivers prescribing different medicines for you. This is a common problem for elderly patients.  You have a long-term (chronic) medical condition.  You have had an organ transplant.  You have human immunodeficiency virus (HIV).  You are undergoing chemotherapy.  You have hepatitis.  You have kidney disease or kidney failure.  You have significant heart disease or lung disease.  You are elderly.  You are taking medicines that have a low margin of error. This means there is little difference between the right amount of medicine and too much medicine. Medicines in this category include:  Warfarin.  Digoxin.  Lithium.  Theophylline.  Monoamine oxidase (MAO) inhibitors.  Seizure medicines.  Cyclosporine. PREVENTION   Choose one caregiver to be in charge of coordinating your medicines. Inform this caregiver about all medicines and supplements you take, even if they are prescribed by another caregiver.  Purchase all your medicines through the same pharmacy. The pharmacy keeps track of your medicines and possible drug interactions. They can notify your caregiver of potential problems.  Read the information given to you at your pharmacy.  Carry a list of all your medicines and  their doses with you. This informs your caregivers, emergency department caregivers, and specialists about the medicines you are taking. This is especially important before you start a new medicine.  Use a system to keep track of which medicines you are taking and when. Many patients use a pillbox that separates medicines by the day and time they are supposed to be taken.  Carry a list of your chronic medical problems with you. Conditions such as kidney problems, liver problems, organ transplants, and chronic viral infections affect the way your body handles medicines.  Ask your caregiver or pharmacist if you have any questions about your medicines. SEEK MEDICAL CARE IF:  You have any problems that may be caused by your medicines.  You have chest pain.  You have shortness of breath.  You have an irregular heartbeat.  You have fainting spells.  You have shaking or tremors.  You have weakness or tiredness (lethargy).  You have a rash or swelling in any part of the body.  You have increased bleeding, rectal bleeding, vaginal bleeding, or you bruise easily.  You have abdominal pain.  You have nausea.  You have vomiting. Document Released: 10/11/2004 Document Revised: 11/26/2011 Document Reviewed: 05/23/2011 [facility] Patient Information 2015 [place], [place]. This information is not intended to replace advice given to you by your health care provider. Make sure you discuss any questions you have with your health care provider.

## 2014-07-21 NOTE — Sleep Study (Unsigned)
Pt study discontinued d/t unable to maintain oxygen level >88% on 3 L/M supplemental O2 per Medical Director pt sent to Emergency Department

## 2014-08-04 ENCOUNTER — Encounter (HOSPITAL_COMMUNITY): Payer: Self-pay | Admitting: *Deleted

## 2014-08-04 ENCOUNTER — Inpatient Hospital Stay (HOSPITAL_COMMUNITY)
Admission: EM | Admit: 2014-08-04 | Discharge: 2014-08-10 | DRG: 189 | Disposition: A | Discharge status: Home / Self Care | Payer: Medicare Other | Attending: Internal Medicine | Admitting: Internal Medicine

## 2014-08-04 ENCOUNTER — Emergency Department (HOSPITAL_COMMUNITY): Payer: Medicare Other

## 2014-08-04 DIAGNOSIS — Z6841 Body Mass Index (BMI) 40.0 and over, adult: Secondary | ICD-10-CM

## 2014-08-04 DIAGNOSIS — Z9981 Dependence on supplemental oxygen: Secondary | ICD-10-CM | POA: Diagnosis not present

## 2014-08-04 DIAGNOSIS — F329 Major depressive disorder, single episode, unspecified: Secondary | ICD-10-CM | POA: Diagnosis present

## 2014-08-04 DIAGNOSIS — J9601 Acute respiratory failure with hypoxia: Secondary | ICD-10-CM | POA: Insufficient documentation

## 2014-08-04 DIAGNOSIS — R0602 Shortness of breath: Secondary | ICD-10-CM

## 2014-08-04 DIAGNOSIS — I2510 Atherosclerotic heart disease of native coronary artery without angina pectoris: Secondary | ICD-10-CM | POA: Diagnosis present

## 2014-08-04 DIAGNOSIS — G4733 Obstructive sleep apnea (adult) (pediatric): Secondary | ICD-10-CM | POA: Diagnosis present

## 2014-08-04 DIAGNOSIS — Z7982 Long term (current) use of aspirin: Secondary | ICD-10-CM

## 2014-08-04 DIAGNOSIS — I5032 Chronic diastolic (congestive) heart failure: Secondary | ICD-10-CM | POA: Diagnosis present

## 2014-08-04 DIAGNOSIS — Z794 Long term (current) use of insulin: Secondary | ICD-10-CM

## 2014-08-04 DIAGNOSIS — J441 Chronic obstructive pulmonary disease with (acute) exacerbation: Secondary | ICD-10-CM | POA: Diagnosis present

## 2014-08-04 DIAGNOSIS — E875 Hyperkalemia: Secondary | ICD-10-CM | POA: Diagnosis present

## 2014-08-04 DIAGNOSIS — E162 Hypoglycemia, unspecified: Secondary | ICD-10-CM

## 2014-08-04 DIAGNOSIS — F209 Schizophrenia, unspecified: Secondary | ICD-10-CM | POA: Diagnosis present

## 2014-08-04 DIAGNOSIS — E780 Pure hypercholesterolemia: Secondary | ICD-10-CM | POA: Diagnosis present

## 2014-08-04 DIAGNOSIS — E662 Morbid (severe) obesity with alveolar hypoventilation: Secondary | ICD-10-CM | POA: Diagnosis present

## 2014-08-04 DIAGNOSIS — E11649 Type 2 diabetes mellitus with hypoglycemia without coma: Secondary | ICD-10-CM | POA: Diagnosis present

## 2014-08-04 DIAGNOSIS — Z87891 Personal history of nicotine dependence: Secondary | ICD-10-CM | POA: Diagnosis not present

## 2014-08-04 DIAGNOSIS — G9341 Metabolic encephalopathy: Secondary | ICD-10-CM | POA: Diagnosis present

## 2014-08-04 DIAGNOSIS — Z888 Allergy status to other drugs, medicaments and biological substances status: Secondary | ICD-10-CM | POA: Diagnosis not present

## 2014-08-04 DIAGNOSIS — I5033 Acute on chronic diastolic (congestive) heart failure: Secondary | ICD-10-CM | POA: Diagnosis present

## 2014-08-04 DIAGNOSIS — J9602 Acute respiratory failure with hypercapnia: Secondary | ICD-10-CM

## 2014-08-04 DIAGNOSIS — J9620 Acute and chronic respiratory failure, unspecified whether with hypoxia or hypercapnia: Secondary | ICD-10-CM | POA: Diagnosis present

## 2014-08-04 DIAGNOSIS — E119 Type 2 diabetes mellitus without complications: Secondary | ICD-10-CM

## 2014-08-04 LAB — GLUCOSE, CAPILLARY: Glucose-Capillary: 314 mg/dL — ABNORMAL HIGH (ref 70–99)

## 2014-08-04 LAB — BASIC METABOLIC PANEL
Anion gap: 5 (ref 5–15)
BUN: 5 mg/dL — ABNORMAL LOW (ref 6–23)
CO2: 42 mEq/L (ref 19–32)
Calcium: 9.1 mg/dL (ref 8.4–10.5)
Chloride: 96 mEq/L (ref 96–112)
Creatinine, Ser: 0.95 mg/dL (ref 0.50–1.35)
GFR, EST NON AFRICAN AMERICAN: 84 mL/min — AB (ref >90)
Glucose, Bld: 91 mg/dL (ref 70–99)
POTASSIUM: 5.5 meq/L — AB (ref 3.7–5.3)
Sodium: 143 mEq/L (ref 137–147)

## 2014-08-04 LAB — BLOOD GAS, ARTERIAL
Acid-Base Excess: 12 mmol/L — ABNORMAL HIGH (ref 0.0–2.0)
BICARBONATE: 38.7 meq/L — AB (ref 20.0–24.0)
Drawn by: 234301
O2 Content: 3 L/min
O2 SAT: 92.1 %
PO2 ART: 65.7 mmHg — AB (ref 80.0–100.0)
Patient temperature: 37
TCO2: 36.3 mmol/L (ref 0–100)
pCO2 arterial: 80.4 mmHg (ref 35.0–45.0)
pH, Arterial: 7.303 — ABNORMAL LOW (ref 7.350–7.450)

## 2014-08-04 LAB — PRO B NATRIURETIC PEPTIDE: Pro B Natriuretic peptide (BNP): 277.3 pg/mL — ABNORMAL HIGH (ref 0–125)

## 2014-08-04 LAB — CBC
HEMATOCRIT: 40.3 % (ref 39.0–52.0)
Hemoglobin: 11.2 g/dL — ABNORMAL LOW (ref 13.0–17.0)
MCH: 27 pg (ref 26.0–34.0)
MCHC: 27.8 g/dL — AB (ref 30.0–36.0)
MCV: 97.1 fL (ref 78.0–100.0)
PLATELETS: 213 10*3/uL (ref 150–400)
RBC: 4.15 MIL/uL — ABNORMAL LOW (ref 4.22–5.81)
RDW: 14.5 % (ref 11.5–15.5)
WBC: 9.4 10*3/uL (ref 4.0–10.5)

## 2014-08-04 LAB — TROPONIN I: Troponin I: <0.3 ng/mL (ref <0.30)

## 2014-08-04 LAB — CBG MONITORING, ED
Glucose-Capillary: 55 mg/dL — ABNORMAL LOW (ref 70–99)
Glucose-Capillary: 115 mg/dL — ABNORMAL HIGH (ref 70–99)

## 2014-08-04 MED ORDER — GABAPENTIN 300 MG PO CAPS
300.0000 mg | ORAL_CAPSULE | Freq: Three times a day (TID) | ORAL | Status: DC
  Administered 2014-08-04 – 2014-08-10 (×17): 300 mg via ORAL
  Filled 2014-08-04 (×17): qty 1

## 2014-08-04 MED ORDER — FENTANYL 50 MCG/HR TD PT72
50.0000 ug | MEDICATED_PATCH | TRANSDERMAL | Status: DC
  Administered 2014-08-04 – 2014-08-07 (×2): 50 ug via TRANSDERMAL
  Filled 2014-08-04 (×2): qty 1

## 2014-08-04 MED ORDER — ONDANSETRON HCL 4 MG/2ML IJ SOLN: 4.0000 mg | Freq: Four times a day (QID) | INTRAMUSCULAR | Status: DC | PRN

## 2014-08-04 MED ORDER — PANTOPRAZOLE SODIUM 40 MG PO TBEC
40.0000 mg | DELAYED_RELEASE_TABLET | Freq: Two times a day (BID) | ORAL | Status: DC
  Administered 2014-08-04 – 2014-08-10 (×12): 40 mg via ORAL
  Filled 2014-08-04 (×12): qty 1

## 2014-08-04 MED ORDER — LORAZEPAM 0.5 MG PO TABS
0.5000 mg | ORAL_TABLET | Freq: Two times a day (BID) | ORAL | Status: DC | PRN
  Administered 2014-08-04 – 2014-08-09 (×10): 0.5 mg via ORAL
  Filled 2014-08-04 (×10): qty 1

## 2014-08-04 MED ORDER — ONDANSETRON HCL 4 MG PO TABS: 4.0000 mg | ORAL_TABLET | Freq: Four times a day (QID) | ORAL | Status: DC | PRN

## 2014-08-04 MED ORDER — SIMVASTATIN 20 MG PO TABS
40.0000 mg | ORAL_TABLET | Freq: Every evening | ORAL | Status: DC
  Administered 2014-08-04 – 2014-08-09 (×6): 40 mg via ORAL
  Filled 2014-08-04 (×6): qty 2

## 2014-08-04 MED ORDER — HYDROMORPHONE HCL 1 MG/ML IJ SOLN
0.5000 mg | Freq: Once | INTRAMUSCULAR | Status: AC
  Administered 2014-08-04: 0.5 mg via INTRAVENOUS
  Filled 2014-08-04: qty 1

## 2014-08-04 MED ORDER — LORAZEPAM 2 MG/ML IJ SOLN
1.0000 mg | Freq: Once | INTRAMUSCULAR | Status: AC
  Administered 2014-08-04: 1 mg via INTRAVENOUS
  Filled 2014-08-04: qty 1

## 2014-08-04 MED ORDER — METHYLPREDNISOLONE SODIUM SUCC 125 MG IJ SOLR
125.0000 mg | Freq: Once | INTRAMUSCULAR | Status: AC
  Administered 2014-08-04: 125 mg via INTRAVENOUS
  Filled 2014-08-04: qty 2

## 2014-08-04 MED ORDER — METOPROLOL TARTRATE 50 MG PO TABS
50.0000 mg | ORAL_TABLET | Freq: Two times a day (BID) | ORAL | Status: DC
  Administered 2014-08-04 – 2014-08-10 (×12): 50 mg via ORAL
  Filled 2014-08-04 (×12): qty 1

## 2014-08-04 MED ORDER — SODIUM CHLORIDE 0.9 % IJ SOLN: 3.0000 mL | INTRAMUSCULAR | Status: DC | PRN

## 2014-08-04 MED ORDER — CITALOPRAM HYDROBROMIDE 20 MG PO TABS
20.0000 mg | ORAL_TABLET | Freq: Every morning | ORAL | Status: DC
Start: 2014-08-05 — End: 2014-08-10
  Administered 2014-08-05 – 2014-08-10 (×6): 20 mg via ORAL
  Filled 2014-08-04 (×6): qty 1

## 2014-08-04 MED ORDER — ZIPRASIDONE HCL 80 MG PO CAPS
ORAL_CAPSULE | ORAL | Status: AC
  Filled 2014-08-04: qty 1

## 2014-08-04 MED ORDER — IPRATROPIUM-ALBUTEROL 0.5-2.5 (3) MG/3ML IN SOLN
3.0000 mL | RESPIRATORY_TRACT | Status: DC
  Administered 2014-08-04 – 2014-08-05 (×6): 3 mL via RESPIRATORY_TRACT
  Filled 2014-08-04 (×5): qty 3

## 2014-08-04 MED ORDER — ENOXAPARIN SODIUM 80 MG/0.8ML ~~LOC~~ SOLN
70.0000 mg | SUBCUTANEOUS | Status: DC
  Administered 2014-08-04 – 2014-08-09 (×6): 70 mg via SUBCUTANEOUS
  Filled 2014-08-04 (×6): qty 0.8

## 2014-08-04 MED ORDER — SODIUM CHLORIDE 0.9 % IJ SOLN
3.0000 mL | Freq: Two times a day (BID) | INTRAMUSCULAR | Status: DC
  Administered 2014-08-04 – 2014-08-10 (×11): 3 mL via INTRAVENOUS

## 2014-08-04 MED ORDER — TERAZOSIN HCL 1 MG PO CAPS
1.0000 mg | ORAL_CAPSULE | Freq: Every day | ORAL | Status: DC
  Administered 2014-08-04 – 2014-08-09 (×6): 1 mg via ORAL
  Filled 2014-08-04 (×6): qty 1

## 2014-08-04 MED ORDER — SODIUM CHLORIDE 0.9 % IV SOLN
250.0000 mL | INTRAVENOUS | Status: DC | PRN
  Administered 2014-08-04 – 2014-08-06 (×2): 250 mL via INTRAVENOUS

## 2014-08-04 MED ORDER — IPRATROPIUM-ALBUTEROL 0.5-2.5 (3) MG/3ML IN SOLN
3.0000 mL | Freq: Once | RESPIRATORY_TRACT | Status: AC
  Administered 2014-08-04: 3 mL via RESPIRATORY_TRACT
  Filled 2014-08-04: qty 3

## 2014-08-04 MED ORDER — LEVOFLOXACIN IN D5W 500 MG/100ML IV SOLN
500.0000 mg | INTRAVENOUS | Status: DC
  Administered 2014-08-04 – 2014-08-05 (×2): 500 mg via INTRAVENOUS
  Filled 2014-08-04 (×2): qty 100

## 2014-08-04 MED ORDER — ZIPRASIDONE HCL 80 MG PO CAPS
80.0000 mg | ORAL_CAPSULE | Freq: Every evening | ORAL | Status: DC
  Administered 2014-08-04 – 2014-08-09 (×6): 80 mg via ORAL
  Filled 2014-08-04: qty 2
  Filled 2014-08-04: qty 1
  Filled 2014-08-04 (×4): qty 2
  Filled 2014-08-04: qty 1

## 2014-08-04 MED ORDER — BRIMONIDINE TARTRATE 0.2 % OP SOLN
OPHTHALMIC | Status: AC
  Filled 2014-08-04: qty 5

## 2014-08-04 MED ORDER — BRIMONIDINE TARTRATE 0.15 % OP SOLN
1.0000 [drp] | Freq: Two times a day (BID) | OPHTHALMIC | Status: DC
  Administered 2014-08-04 – 2014-08-10 (×12): 1 [drp] via OPHTHALMIC
  Filled 2014-08-04: qty 5

## 2014-08-04 MED ORDER — ASPIRIN 81 MG PO CHEW
81.0000 mg | CHEWABLE_TABLET | Freq: Every morning | ORAL | Status: DC
  Administered 2014-08-05 – 2014-08-10 (×6): 81 mg via ORAL
  Filled 2014-08-04 (×6): qty 1

## 2014-08-04 MED ORDER — INSULIN ASPART 100 UNIT/ML ~~LOC~~ SOLN
0.0000 [IU] | Freq: Three times a day (TID) | SUBCUTANEOUS | Status: DC
Start: 2014-08-05 — End: 2014-08-05
  Administered 2014-08-05: 5 [IU] via SUBCUTANEOUS

## 2014-08-04 MED ORDER — GUAIFENESIN ER 600 MG PO TB12
1200.0000 mg | ORAL_TABLET | Freq: Two times a day (BID) | ORAL | Status: DC
  Administered 2014-08-04 – 2014-08-10 (×12): 1200 mg via ORAL
  Filled 2014-08-04 (×12): qty 2

## 2014-08-04 MED ORDER — CLOPIDOGREL BISULFATE 75 MG PO TABS
75.0000 mg | ORAL_TABLET | Freq: Every morning | ORAL | Status: DC
  Administered 2014-08-05 – 2014-08-10 (×6): 75 mg via ORAL
  Filled 2014-08-04 (×6): qty 1

## 2014-08-04 MED ORDER — METHYLPREDNISOLONE SODIUM SUCC 125 MG IJ SOLR
60.0000 mg | Freq: Four times a day (QID) | INTRAMUSCULAR | Status: DC
  Administered 2014-08-04 – 2014-08-07 (×10): 60 mg via INTRAVENOUS
  Filled 2014-08-04 (×11): qty 2

## 2014-08-04 MED ORDER — FUROSEMIDE 10 MG/ML IJ SOLN
20.0000 mg | Freq: Two times a day (BID) | INTRAMUSCULAR | Status: DC
  Administered 2014-08-04 – 2014-08-07 (×6): 20 mg via INTRAVENOUS
  Filled 2014-08-04 (×6): qty 2

## 2014-08-04 NOTE — ED Provider Notes (Signed)
CSN: [id number]     Arrival date & time 08/04/14  1439 History   First MD Initiated Contact with Patient 08/04/14 1504     Chief Complaint  Patient presents with  . Shortness of Breath     (Consider location/radiation/quality/duration/timing/severity/associated sxs/prior Treatment) HPI Comments: Patient presents to the ER for evaluation of shortness of breath. Patient has been experiencing progressively worsening shortness of breath for 5 days. Patient reports cough but with significantly increased sputum production. Patient reports continuous shortness of breath but he gets significantly more short of breath with minimal exertion. He has not been expressing any chest pain. There has not been any fever.  Patient also reports that he has been experiencing hypoglycemia for last 2 or 3 days. This seems to happen mostly in the mornings. Patient reports morning blood glucose of 32 and 55 the last 2 days.  Patient is a 68 y.o. male presenting with shortness of breath.  Shortness of Breath Associated symptoms: cough     Past Medical History  Diagnosis Date  . COPD (chronic obstructive pulmonary disease)   . CHF (congestive heart failure)     diastolic   . Hypercholesterolemia   . Depression   . Schizophrenia   . Sleep apnea     noncompliant with BiPAP  . Tobacco abuse   . On home O2     3 liters  . Chronic pain   . Diabetes mellitus without complication     insulin pump   Past Surgical History  Procedure Laterality Date  . Knee surgery      X2  . 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   . Colonoscopy  July 2011    Dr. Benson Norway: multiple polyps, internal and external hemorrhoids, diverticulosis. tubular adenoma  . Esophagogastroduodenoscopy (egd) with propofol N/A 04/20/2013    Procedure: ESOPHAGOGASTRODUODENOSCOPY (EGD) WITH PROPOFOL;  Surgeon: Daneil Dolin, MD;  Location: [facility];  Service:  Endoscopy;  Laterality: N/A;   Family History  Problem Relation Age of Onset  . Colon cancer Neg Hx   . Parkinson's disease    . Thyroid disease Mother   . Parkinson's disease Father    History  Substance Use Topics  . Smoking status: Former Smoker -- 1.00 packs/day for 50 years    Types: Cigarettes    Quit date: 09/16/2013  . Smokeless tobacco: Never Used  . Alcohol Use: No     Comment: history of ETOH abuse in remote past, none in at least 10 years    Review of Systems  Respiratory: Positive for cough and shortness of breath.   All other systems reviewed and are negative.     Allergies  Phenergan and Haldol  Home Medications   Prior to Admission medications   Medication Sig Start Date End Date Taking? Authorizing Provider  albuterol (PROVENTIL) (2.5 MG/3ML) 0.083% nebulizer solution Take 3 mLs (2.5 mg total) by nebulization every 6 (six) hours as needed for wheezing or shortness of breath. 05/11/14  Yes Radene Gunning, NP  aspirin 81 MG chewable tablet Chew 81 mg by mouth every morning.    Yes Historical Provider, MD  brimonidine (ALPHAGAN) 0.15 % ophthalmic solution Place 1 drop into the right eye 2 (two) times daily.    Yes Historical Provider, MD  Cinnamon 500 MG capsule Take 1,000 mg by mouth every morning.    Yes Historical Provider, MD  citalopram (CELEXA) 20  MG tablet Take 20 mg by mouth every morning.   Yes Historical Provider, MD  clopidogrel (PLAVIX) 75 MG tablet Take 75 mg by mouth every morning.   Yes Historical Provider, MD  fentaNYL (DURAGESIC - DOSED MCG/HR) 50 MCG/HR Place 50 mcg onto the skin every 3 (three) days. 07/20/14  Yes Historical Provider, MD  furosemide (LASIX) 40 MG tablet Take 40 mg by mouth daily.   Yes Historical Provider, MD  gabapentin (NEURONTIN) 300 MG capsule Take 300 mg by mouth 3 (three) times daily.   Yes Historical Provider, MD  insulin lispro (HUMALOG) 100 UNIT/ML injection by Pump Prime route continuous.    Yes Historical Provider, MD   metoprolol (LOPRESSOR) 50 MG tablet Take 50 mg by mouth 2 (two) times daily.   Yes Historical Provider, MD  pantoprazole (PROTONIX) 40 MG tablet Take 40 mg by mouth 2 (two) times daily.   Yes Historical Provider, MD  simvastatin (ZOCOR) 40 MG tablet Take 40 mg by mouth every evening.   Yes Historical Provider, MD  terazosin (HYTRIN) 1 MG capsule Take 1 mg by mouth at bedtime.   Yes Historical Provider, MD  tiotropium (SPIRIVA HANDIHALER) 18 MCG inhalation capsule Place 1 capsule (18 mcg total) into inhaler and inhale every morning. 05/11/14  Yes Radene Gunning, NP  traZODone (DESYREL) 150 MG tablet Take 150 mg by mouth at bedtime.    Yes Historical Provider, MD  triamcinolone cream (KENALOG) 0.1 % Apply 1 application topically daily as needed (for irritation).    Yes Historical Provider, MD  ziprasidone (GEODON) 80 MG capsule Take 80 mg by mouth every evening.   Yes Historical Provider, MD  albuterol (PROVENTIL HFA;VENTOLIN HFA) 108 (90 BASE) MCG/ACT inhaler Inhale 2 puffs into the lungs every 6 (six) hours as needed for wheezing or shortness of breath. 05/11/14   Radene Gunning, NP  fentaNYL (DURAGESIC - DOSED MCG/HR) 12 MCG/HR Place 1 patch (12.5 mcg total) onto the skin every 3 (three) days. Patient not taking: Reported on 08/04/2014 07/17/14   Nita Sells, MD  LORazepam (ATIVAN) 1 MG tablet Take 0.5 tablets (0.5 mg total) by mouth 2 (two) times daily as needed for anxiety. Patient not taking: Reported on 08/04/2014 07/17/14   Nita Sells, MD  predniSONE (DELTASONE) 20 MG tablet Take 2 tablets (40 mg total) by mouth daily before breakfast. Patient not taking: Reported on 08/04/2014 07/17/14   Nita Sells, MD   BP 125/49 mmHg  Pulse 71  Temp(Src) 98 F (36.7 C) (Oral)  Resp 17  Ht 6' (1.829 m)  Wt 301 lb (136.533 kg)  BMI 40.81 kg/m2  SpO2 93% Physical Exam  Constitutional: He is oriented to person, place, and time. He appears well-developed and well-nourished. No  distress.  HENT:  Head: Normocephalic and atraumatic.  Right Ear: Hearing normal.  Left Ear: Hearing normal.  Nose: Nose normal.  Mouth/Throat: Oropharynx is clear and moist and mucous membranes are normal.  Eyes: Conjunctivae and EOM are normal. Pupils are equal, round, and reactive to light.  Neck: Normal range of motion. Neck supple.  Cardiovascular: Regular rhythm, S1 normal and S2 normal.  Exam reveals no gallop and no friction rub.   No murmur heard. Pulmonary/Chest: Effort normal. No respiratory distress. He has decreased breath sounds. He has wheezes. He has rales. He exhibits no tenderness.  Abdominal: Soft. Normal appearance and bowel sounds are normal. There is no hepatosplenomegaly. There is no tenderness. There is no rebound, no guarding, no tenderness at McBurney's  point and negative Murphy's sign. No hernia.  Musculoskeletal: Normal range of motion.  Neurological: He is alert and oriented to person, place, and time. He has normal strength. No cranial nerve deficit or sensory deficit. Coordination normal. GCS eye subscore is 4. GCS verbal subscore is 5. GCS motor subscore is 6.  Skin: Skin is warm, dry and intact. No rash noted. No cyanosis.  Psychiatric: He has a normal mood and affect. His speech is normal and behavior is normal. Thought content normal.  Nursing note and vitals reviewed.   ED Course  Procedures (including critical care time) Labs Review Labs Reviewed  CBC - Abnormal; Notable for the following:    RBC 4.15 (*)    Hemoglobin 11.2 (*)    MCHC 27.8 (*)    All other components within normal limits  BASIC METABOLIC PANEL - Abnormal; Notable for the following:    Potassium 5.5 (*)    CO2 42 (*)    BUN 5 (*)    GFR calc non Af Amer 84 (*)    All other components within normal limits  PRO B NATRIURETIC PEPTIDE - Abnormal; Notable for the following:    Pro B Natriuretic peptide (BNP) 277.3 (*)    All other components within normal limits  BLOOD GAS,  ARTERIAL - Abnormal; Notable for the following:    pH, Arterial 7.303 (*)    pCO2 arterial 80.4 (*)    pO2, Arterial 65.7 (*)    Bicarbonate 38.7 (*)    Acid-Base Excess 12.0 (*)    All other components within normal limits  CBG MONITORING, ED - Abnormal; Notable for the following:    Glucose-Capillary 55 (*)    All other components within normal limits  CBG MONITORING, ED - Abnormal; Notable for the following:    Glucose-Capillary 115 (*)    All other components within normal limits  TROPONIN I    Imaging Review Dg Chest 2 View  08/04/2014   CLINICAL DATA:  68 year old with shortness of breath  EXAM: CHEST  2 VIEW  COMPARISON:  07/21/2014  FINDINGS: The cardiac silhouette is enlarged. The mediastinal contours are unchanged. Atherosclerotic aortic calcifications are present.  There is prominence of the interstitial markings and pulmonary vasculature. Small to moderate bilateral pleural effusions are present, left greater than right. There is no pneumothorax. There is no focal airspace consolidation.  Mild degenerative changes of the spine are noted.  IMPRESSION: 1. Cardiomegaly with mild interstitial pulmonary edema. 2. Bilateral pleural effusions.   Electronically Signed   By: Rosemarie Ax   On: 08/04/2014 15:47     EKG Interpretation None      MDM   Final diagnoses:  Acute respiratory failure with hypoxia and hypercarbia  Hypoglycemia   Patient presents to the ER for evaluation of shortness of breath.patient reports a progressive worsening of symptoms over a period of approximately 5 days. He has been very weak, having trouble walking because of significant shortness of breath with exertion as well as the generalized weakness causing him to feel like he is going to fall. Patient did not have chest pain. Cardiac evaluation thus far has been negative, the patient has evidence of acute respiratory failure with hypoxia and hypercarbia. Patient administered Solu-Medrol, DuoNeb's. He  will be placed on BiPAP. Patient tells me that he is being evaluated for BiPAP at night, but has not completed sleep studies.  Patient also experiencing frequent hypoglycemia. Sugar was 55 on arrival today. Insulin pump has been removed. Solu-Medrol  should help with this, but he has been having recurrent hypoglycemia over the last few days and this will need to be addressed as well.  Patient to be admitted to the hospital for further management.  CRITICAL CARE Performed by: Orpah Greek   Total critical care time: 12min  Critical care time was exclusive of separately billable procedures and treating other patients.  Critical care was necessary to treat or prevent imminent or life-threatening deterioration.  Critical care was time spent personally by me on the following activities: development of treatment plan with patient and/or surrogate as well as nursing, discussions with consultants, evaluation of patient's response to treatment, examination of patient, obtaining history from patient or surrogate, ordering and performing treatments and interventions, ordering and review of laboratory studies, ordering and review of radiographic studies, pulse oximetry and re-evaluation of patient's condition.     Orpah Greek, MD 08/04/14 [phone]

## 2014-08-04 NOTE — Plan of Care (Signed)
Problem: ICU Phase Progression Outcomes Goal: O2 sats trending toward baseline Outcome: Progressing Goal: Dyspnea controlled at rest Outcome: Progressing Goal: Hemodynamically stable Outcome: Progressing Goal: Pain controlled with appropriate interventions Outcome: Progressing Goal: Flu/PneumoVaccines if indicated Outcome: Progressing Goal: Initial discharge plan identified Outcome: Progressing

## 2014-08-04 NOTE — ED Notes (Signed)
Pt drinking second apple juice. Pt has insulin pump which is assessed, per patient and his wife pump "gives 5 units of insulin all the time", instructed to cut off pump since patient is hypoglycemia. Pt's wife deassessed pump completely while nurse and tech in room.

## 2014-08-04 NOTE — ED Notes (Signed)
CRITICAL VALUE ALERT  Critical value received:  Co2 42  Date of notification:  08/04/14  Time of notification:  [id number]  Critical value read back:Yes.    Nurse who received alert:  Iona Coach  MD notified (1st page):  [place]  Time of first page:  [phone]

## 2014-08-04 NOTE — H&P (Signed)
PCP:   [name], MD   Chief Complaint:  Shortness of breath  HPI:  68 year old male who  has a past medical history of COPD (chronic obstructive pulmonary disease); CHF (congestive heart failure); Hypercholesterolemia; Depression; Schizophrenia; Sleep apnea; Tobacco abuse; On home O2; Chronic pain; and Diabetes mellitus without complication. Today presents to the ED with chief complaint of shortness of breath, patient is currently on BiPAP so the history is limited. Patient has history of chronic respiratory failure, and is on continuous home O2. Started experiencing shortness of breath which was worse than his usual reading. He denies chest pain, no nausea vomiting or diarrhea. He had cough with clear phlegm. Denies fever. In the ED patient found to be in acute hypercapnic respiratory failure, with ABG showing PCO2 of 80.4 Currently patient on BiPAP and feeling better.   Allergies:   Allergies  Allergen Reactions  . Phenergan [Promethazine Hcl] Other (See Comments)    Becomes very confused and aggressive  . Haldol [Haloperidol Lactate] Other (See Comments)    Shaking       Past Medical History  Diagnosis Date  . COPD (chronic obstructive pulmonary disease)   . CHF (congestive heart failure)     diastolic   . Hypercholesterolemia   . Depression   . Schizophrenia   . Sleep apnea     noncompliant with BiPAP  . Tobacco abuse   . On home O2     3 liters  . Chronic pain   . Diabetes mellitus without complication     insulin pump    Past Surgical History  Procedure Laterality Date  . Knee surgery      X2  . 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   . Colonoscopy  July 2011    Dr. Benson Norway: multiple polyps, internal and external hemorrhoids, diverticulosis. tubular adenoma  . Esophagogastroduodenoscopy (egd) with propofol N/A 04/20/2013    Procedure: ESOPHAGOGASTRODUODENOSCOPY (EGD) WITH  PROPOFOL;  Surgeon: Daneil Dolin, MD;  Location: [facility];  Service: Endoscopy;  Laterality: N/A;    Prior to Admission medications   Medication Sig Start Date End Date Taking? Authorizing Provider  albuterol (PROVENTIL) (2.5 MG/3ML) 0.083% nebulizer solution Take 3 mLs (2.5 mg total) by nebulization every 6 (six) hours as needed for wheezing or shortness of breath. 05/11/14  Yes Radene Gunning, NP  aspirin 81 MG chewable tablet Chew 81 mg by mouth every morning.    Yes Historical Provider, MD  brimonidine (ALPHAGAN) 0.15 % ophthalmic solution Place 1 drop into the right eye 2 (two) times daily.    Yes Historical Provider, MD  Cinnamon 500 MG capsule Take 1,000 mg by mouth every morning.    Yes Historical Provider, MD  citalopram (CELEXA) 20 MG tablet Take 20 mg by mouth every morning.   Yes Historical Provider, MD  clopidogrel (PLAVIX) 75 MG tablet Take 75 mg by mouth every morning.   Yes Historical Provider, MD  fentaNYL (DURAGESIC - DOSED MCG/HR) 50 MCG/HR Place 50 mcg onto the skin every 3 (three) days. 07/20/14  Yes Historical Provider, MD  furosemide (LASIX) 40 MG tablet Take 40 mg by mouth daily.   Yes Historical Provider, MD  gabapentin (NEURONTIN) 300 MG capsule Take 300 mg by mouth 3 (three) times daily.   Yes Historical Provider, MD  insulin lispro (HUMALOG) 100 UNIT/ML injection by Pump Prime route continuous.    Yes Historical  Provider, MD  metoprolol (LOPRESSOR) 50 MG tablet Take 50 mg by mouth 2 (two) times daily.   Yes Historical Provider, MD  pantoprazole (PROTONIX) 40 MG tablet Take 40 mg by mouth 2 (two) times daily.   Yes Historical Provider, MD  simvastatin (ZOCOR) 40 MG tablet Take 40 mg by mouth every evening.   Yes Historical Provider, MD  terazosin (HYTRIN) 1 MG capsule Take 1 mg by mouth at bedtime.   Yes Historical Provider, MD  tiotropium (SPIRIVA HANDIHALER) 18 MCG inhalation capsule Place 1 capsule (18 mcg total) into inhaler and inhale every morning. 05/11/14  Yes Radene Gunning, NP  traZODone (DESYREL) 150 MG tablet Take 150 mg by mouth at bedtime.    Yes Historical Provider, MD  triamcinolone cream (KENALOG) 0.1 % Apply 1 application topically daily as needed (for irritation).    Yes Historical Provider, MD  ziprasidone (GEODON) 80 MG capsule Take 80 mg by mouth every evening.   Yes Historical Provider, MD  albuterol (PROVENTIL HFA;VENTOLIN HFA) 108 (90 BASE) MCG/ACT inhaler Inhale 2 puffs into the lungs every 6 (six) hours as needed for wheezing or shortness of breath. 05/11/14   Radene Gunning, NP  fentaNYL (DURAGESIC - DOSED MCG/HR) 12 MCG/HR Place 1 patch (12.5 mcg total) onto the skin every 3 (three) days. Patient not taking: Reported on 08/04/2014 07/17/14   Nita Sells, MD  LORazepam (ATIVAN) 1 MG tablet Take 0.5 tablets (0.5 mg total) by mouth 2 (two) times daily as needed for anxiety. Patient not taking: Reported on 08/04/2014 07/17/14   Nita Sells, MD  predniSONE (DELTASONE) 20 MG tablet Take 2 tablets (40 mg total) by mouth daily before breakfast. Patient not taking: Reported on 08/04/2014 07/17/14   Nita Sells, MD    Social History:  reports that he quit smoking about 10 months ago. His smoking use included Cigarettes. He has a 50 pack-year smoking history. He has never used smokeless tobacco. He reports that he does not drink alcohol or use illicit drugs.  Family History  Problem Relation Age of Onset  . Colon cancer Neg Hx   . Parkinson's disease    . Thyroid disease Mother   . Parkinson's disease Father      All the positives are listed in BOLD  Review of Systems:  HEENT: Headache, blurred vision, runny nose, sore throat Neck: Hypothyroidism, hyperthyroidism,,lymphadenopathy Chest : Shortness of breath, history of COPD, Asthma Heart : Chest pain, history of coronary arterey disease GI:  Nausea, vomiting, diarrhea, constipation, GERD GU: Dysuria, urgency, frequency of urination, hematuria Neuro: Stroke,  seizures, syncope Psych: Depression, anxiety, hallucinations   Physical Exam: Blood pressure 167/94, pulse 78, temperature 98 F (36.7 C), temperature source Oral, resp. rate 19, height 6' (1.829 m), weight 136.533 kg (301 lb), SpO2 90 %. Constitutional:   Patient is a well-developed and well-nourished *male  in no acute distress and cooperative with exam. Head: Normocephalic and atraumatic Mouth: Mucus membranes moist Eyes: PERRL, EOMI, conjunctivae normal Neck: Supple, No Thyromegaly Cardiovascular: RRR, S1 normal, S2 normal Pulmonary/Chest: Decreased breath sounds bilaterally  Abdominal: Soft. Non-tender, non-distended, bowel sounds are normal, no masses, organomegaly, or guarding present.  Neurological: A&O x3, Strength is normal and symmetric bilaterally, cranial nerve II-XII are grossly intact, no focal motor deficit, sensory intact to light touch bilaterally.  Extremities : No Cyanosis, Clubbing . Trace edema of the lower extremities.  Labs on Admission:  Basic Metabolic Panel:  Recent Labs Lab 08/04/14 1525  NA 143  K 5.5*  CL 96  CO2 42*  GLUCOSE 91  BUN 5*  CREATININE 0.95  CALCIUM 9.1   CBC:  Recent Labs Lab 08/04/14 1525  WBC 9.4  HGB 11.2*  HCT 40.3  MCV 97.1  PLT 213   Cardiac Enzymes:  Recent Labs Lab 08/04/14 1525  TROPONINI <0.30    BNP (last 3 results)  Recent Labs  07/14/14 1830 07/21/14 0143 08/04/14 1525  PROBNP 211.1* 99.9 277.3*   CBG:  Recent Labs Lab 08/04/14 1457 08/04/14 1602  GLUCAP 55* 115*    Radiological Exams on Admission: Dg Chest 2 View  08/04/2014   CLINICAL DATA:  68 year old with shortness of breath  EXAM: CHEST  2 VIEW  COMPARISON:  07/21/2014  FINDINGS: The cardiac silhouette is enlarged. The mediastinal contours are unchanged. Atherosclerotic aortic calcifications are present.  There is prominence of the interstitial markings and pulmonary vasculature. Small to moderate bilateral pleural effusions are  present, left greater than right. There is no pneumothorax. There is no focal airspace consolidation.  Mild degenerative changes of the spine are noted.  IMPRESSION: 1. Cardiomegaly with mild interstitial pulmonary edema. 2. Bilateral pleural effusions.   Electronically Signed   By: Rosemarie Ax   On: 08/04/2014 15:47   Chest x-ray independently reviewed, marked bilateral pulmonary vascular congestion, bilateral pleural effusions    Assessment/Plan Active Problems:   Obstructive sleep apnea   COPD exacerbation   CAD (coronary artery disease)   DM (diabetes mellitus)   Hyperkalemia   Acute respiratory failure with hypercapnia   COPD with acute exacerbation  Acute hypercapnic respiratory failure Secondary to COPD exacerbation, will start the patient on salmeterol 60 mg IV every 6 hours, DuoNeb nebulizers every 4 hours scheduled, Mucinex DM 1 tablet by mouth twice a day.  Levaquin 500 mg IV daily. Patient will be admitted to the stepdown unit, continue BiPAP. Will consult pulmonary in a.m.  Mild pulmonary edema Chest x-ray shows Cardima live with mild interstitial pulmonary edema, and bilateral pleural effusions. Patient takes Lasix 40 mg daily at home, echocardiogram done in October [id number] showed diastolic dysfunction with EF 65-70%. We'll start the patient on IV Lasix 20 g IV every 12 hours, Foley catheter to gravity for monitoring intake and output. BNP elevated to 277.3  Hyperkalemia Patient will be started on Lasix 20 g IV every 12 hours. Follow BMP in a.m.  Diabetes mellitus We'll start the patient on sliding-scale insulin with NovoLog. Check CBG every before meals and at bedtime.  Obstructive sleep apnea Patient could not complete the sleep study as outpatient, so he could not get C Pap at home. Will need to set up outpatient sleep study.  Coronary artery disease Patient takes aspirin and Plavix, no history of stents in the coronary arteries. Continue metoprolol.  DVT  prophylaxis Lovenox  Code status:Patient is full code  Family discussion: No family at bedside   Time Spent on Admission: 73 minutes   [facility] Pager: [phone] 08/04/2014, 7:33 PM  If 7PM-7AM, please contact night-coverage  [email or web address]  Password [email or web address]

## 2014-08-04 NOTE — ED Notes (Addendum)
Pt states he has COPD, he is a CO2 retainer, pt is on chronic 3L [place] O2 at home, pt does no have O2 with him, placed on portable O2 in triage. Pt states he has been feeling increased SOB and lethargy for past few weeks. Pt has an insulin pump, family states glucose was 32 yesterday, pt has been falling at home, pt has multiple bruises on arms and legs.

## 2014-08-04 NOTE — ED Notes (Signed)
CRITICAL VALUE ALERT  Critical value received:  pc02- 80.4 per abg  Date of notification:  08/04/2014  Time of notification: [id number]  Critical value read back:Yes.    Nurse who received alert:  Iona Coach  MD notified (1st page):  [place]  Time of first page:  [id number]    Time MD responded:  [id number]

## 2014-08-05 DIAGNOSIS — J9621 Acute and chronic respiratory failure with hypoxia: Secondary | ICD-10-CM

## 2014-08-05 DIAGNOSIS — E118 Type 2 diabetes mellitus with unspecified complications: Secondary | ICD-10-CM

## 2014-08-05 LAB — COMPREHENSIVE METABOLIC PANEL
ALBUMIN: 3.3 g/dL — AB (ref 3.5–5.2)
ALK PHOS: 101 U/L (ref 39–117)
ALT: 8 U/L (ref 0–53)
ANION GAP: 5 (ref 5–15)
AST: 8 U/L (ref 0–37)
BILIRUBIN TOTAL: 0.3 mg/dL (ref 0.3–1.2)
BUN: 9 mg/dL (ref 6–23)
CHLORIDE: 92 meq/L — AB (ref 96–112)
CO2: 42 mEq/L (ref 19–32)
Calcium: 9 mg/dL (ref 8.4–10.5)
Creatinine, Ser: 0.97 mg/dL (ref 0.50–1.35)
GFR calc Af Amer: >90 mL/min (ref >90)
GFR calc non Af Amer: 84 mL/min — ABNORMAL LOW (ref >90)
Glucose, Bld: 310 mg/dL — ABNORMAL HIGH (ref 70–99)
Potassium: 6 mEq/L — ABNORMAL HIGH (ref 3.7–5.3)
Sodium: 139 mEq/L (ref 137–147)
Total Protein: 7 g/dL (ref 6.0–8.3)

## 2014-08-05 LAB — CBC
HCT: 39.6 % (ref 39.0–52.0)
Hemoglobin: 11.2 g/dL — ABNORMAL LOW (ref 13.0–17.0)
MCH: 27.1 pg (ref 26.0–34.0)
MCHC: 28.3 g/dL — AB (ref 30.0–36.0)
MCV: 95.7 fL (ref 78.0–100.0)
PLATELETS: 196 10*3/uL (ref 150–400)
RBC: 4.14 MIL/uL — AB (ref 4.22–5.81)
RDW: 14 % (ref 11.5–15.5)
WBC: 9.6 10*3/uL (ref 4.0–10.5)

## 2014-08-05 LAB — GLUCOSE, CAPILLARY
GLUCOSE-CAPILLARY: 351 mg/dL — AB (ref 70–99)
Glucose-Capillary: 289 mg/dL — ABNORMAL HIGH (ref 70–99)
Glucose-Capillary: 345 mg/dL — ABNORMAL HIGH (ref 70–99)
Glucose-Capillary: 373 mg/dL — ABNORMAL HIGH (ref 70–99)

## 2014-08-05 MED ORDER — INSULIN ASPART 100 UNIT/ML ~~LOC~~ SOLN
0.0000 [IU] | Freq: Three times a day (TID) | SUBCUTANEOUS | Status: DC
  Administered 2014-08-05: 15 [IU] via SUBCUTANEOUS
  Administered 2014-08-05: 20 [IU] via SUBCUTANEOUS
  Administered 2014-08-06: 7 [IU] via SUBCUTANEOUS
  Administered 2014-08-06: 11 [IU] via SUBCUTANEOUS
  Administered 2014-08-06: 15 [IU] via SUBCUTANEOUS
  Administered 2014-08-07 (×3): 7 [IU] via SUBCUTANEOUS
  Administered 2014-08-08: 11 [IU] via SUBCUTANEOUS
  Administered 2014-08-08: 4 [IU] via SUBCUTANEOUS
  Administered 2014-08-08: 11 [IU] via SUBCUTANEOUS
  Administered 2014-08-09: 7 [IU] via SUBCUTANEOUS
  Administered 2014-08-09: 16 [IU] via SUBCUTANEOUS
  Administered 2014-08-09: 11 [IU] via SUBCUTANEOUS
  Administered 2014-08-10: 4 [IU] via SUBCUTANEOUS

## 2014-08-05 MED ORDER — INSULIN ASPART 100 UNIT/ML ~~LOC~~ SOLN
0.0000 [IU] | Freq: Every day | SUBCUTANEOUS | Status: DC
  Administered 2014-08-05: 5 [IU] via SUBCUTANEOUS
  Administered 2014-08-06: 2 [IU] via SUBCUTANEOUS
  Administered 2014-08-07 – 2014-08-09 (×2): 3 [IU] via SUBCUTANEOUS

## 2014-08-05 MED ORDER — INSULIN ASPART 100 UNIT/ML ~~LOC~~ SOLN
5.0000 [IU] | Freq: Three times a day (TID) | SUBCUTANEOUS | Status: DC
  Administered 2014-08-05 – 2014-08-10 (×15): 5 [IU] via SUBCUTANEOUS

## 2014-08-05 MED ORDER — ALBUTEROL SULFATE (2.5 MG/3ML) 0.083% IN NEBU: 2.5000 mg | INHALATION_SOLUTION | Freq: Four times a day (QID) | RESPIRATORY_TRACT | Status: DC | PRN

## 2014-08-05 MED ORDER — IPRATROPIUM-ALBUTEROL 0.5-2.5 (3) MG/3ML IN SOLN
3.0000 mL | RESPIRATORY_TRACT | Status: DC
  Administered 2014-08-06 – 2014-08-09 (×19): 3 mL via RESPIRATORY_TRACT
  Filled 2014-08-05 (×19): qty 3

## 2014-08-05 MED ORDER — SODIUM POLYSTYRENE SULFONATE 15 GM/60ML PO SUSP
30.0000 g | Freq: Once | ORAL | Status: AC
  Administered 2014-08-05: 30 g via ORAL
  Filled 2014-08-05: qty 120

## 2014-08-05 MED ORDER — INSULIN GLARGINE 100 UNIT/ML ~~LOC~~ SOLN
27.0000 [IU] | Freq: Every day | SUBCUTANEOUS | Status: DC
  Administered 2014-08-05 – 2014-08-10 (×6): 27 [IU] via SUBCUTANEOUS
  Filled 2014-08-05 (×7): qty 0.27

## 2014-08-05 MED ORDER — LORAZEPAM 2 MG/ML IJ SOLN
1.0000 mg | Freq: Once | INTRAMUSCULAR | Status: AC
  Administered 2014-08-05: 1 mg via INTRAVENOUS
  Filled 2014-08-05: qty 1

## 2014-08-05 MED ORDER — HYDROCODONE-ACETAMINOPHEN 5-325 MG PO TABS
1.0000 | ORAL_TABLET | Freq: Four times a day (QID) | ORAL | Status: DC | PRN
  Administered 2014-08-05 – 2014-08-10 (×15): 1 via ORAL
  Filled 2014-08-05 (×15): qty 1

## 2014-08-05 NOTE — Progress Notes (Signed)
Patient reports that his is followed by Dr. Isaac Laud, PharmD at [facility] [phone]) for diabetes management. Patient states that he is working with Darl Householder for insulin pump changes and actually had an appointment with her today for insulin pump changes due to frequent hypoglycemia. According to the patient he started using an insulin pump about 4 months ago. He states that he uses the Medtronic insulin pump with Humalog insulin. Patient does not have his insulin pump at the hospital as his "wife has taken it home". Therefore, not able to review insulin pump settings at this time. In talking with the patient he reports that his insulin pump reminds him to check his glucose throughout the day. According to the patient he states that his glucose has been running low almost every morning as he is awakened from sleep with feelings of low blood sugars around 3:00-4:00 am. Otherwise, patient states that his glucose runs in 100's mg/dl for the most part unless he eats more than he should. Patient reports that he meet with a [employer] representative on this past Monday to download his pump. Patient is not able to verbalize any insulin pump settings. Inquired about information he puts in insulin pump and he states that he still does not know a lot about counting carbs so he enters 60 grams of carbs for every meal and uses the bolus wizard to calculate amount of insulin needed for correction and carbohydrates. He states that if he snacks throughout the day he may enter 30 carbs for the snack but if it is close to meal time then he may not enter any carbs for snack coverage. Informed patient that since his insulin pump has been removed he will be given Lantus and Novolog for glycemic control as an inpatient. Patient reports that he understands and is agreeable to taking Lantus and Novolog insulin while his insulin pump is off. Called Dr. Theodoro Grist office to request insulin pump settings and talked with  Darl Householder and she states insulin pump settings are:   Basal4 units/hour (Total basal insulin 96 units per day) Carb ratio1:4 (1 unit for every 4 grams of carbs) Insulin sensitivity1:12 (1 unit drops glucose 12 mg/dl) Target Glucose120 mg/dl  Insulin pump settings are the same as they were in August when I called Santiago Glad the last time during that hospital admission. In talking with Darl Householder she states that she has reviewed the insulin pump downloads obtained by the [employer] representative on Monday and it appears that the patient is over bolusing for snacks especially at night before bed which is causing insulin stacking with noted hypoglycemia in the early morning. Darl Householder states that after reviewing the downloads that she was not going to make any changes with the settings because other then the early morning lows glucose looks okay overall.   Talked with the patient regarding information discussed with Santiago Glad and encouraged him not to snack as much before bed and not to over bolus with carbohydrate coverage. Explained how the insulin is stacking and making his sugar drop during the early morning hours while he is asleep. Patient verbalized understanding of information and reports that he does not have any other questions related to diabetes at this time.  Thanks, Barnie Alderman, RN, MSN, CCRN, CDE Diabetes Coordinator Inpatient Diabetes Program [phone] (Team Pager) [phone] ([facility] office) [phone] [facility] office)

## 2014-08-05 NOTE — Care Management Note (Addendum)
    Page 1 of 2   08/10/2014     1:02:51 PM CARE MANAGEMENT NOTE 08/10/2014  Patient:  Rick Cruz, Rick Cruz   Account Number:  [id number]  Date Initiated:  08/05/2014  Documentation initiated by:  Jolene Provost  Subjective/Objective Assessment:   Pt is from home with wife and son. Pt has Home O2 with [facility] and was discharged from [facility] for [facility] RN on 11/18. Pt has a cane, walker and lift chair at home.     Action/Plan:   Pt plans to discharge home with [facility] services. Pt requests [facility] for [facility]. [facility] emailed to see if pt is candidate for services. anticipate pt needing Bipap or NIV at discharge. Will cont to follow.   Anticipated DC Date:  08/06/2014   Anticipated DC Plan:  [place] [facility]  CM consult      [place]   Choice offered to / List presented to:  C-1 Patient   DME arranged  CPAP      DME agency  [place] arranged  HH-1 RN      [place].   Status of service:  Completed, signed off Medicare Important Message given?  YES (If response is "NO", the following Medicare IM given date fields will be blank) Date Medicare IM given:  08/06/2014 Medicare IM given by:  Vladimir Creeks Date Additional Medicare IM given:  08/10/2014 Additional Medicare IM given by:  Theophilus Kinds  Discharge Disposition:  [place]  Per UR Regulation:    If discussed at Long Length of Stay Meetings, dates discussed:   08/10/2014    Comments:  08/10/14 [place], RN BSN CM Pt discharged home today with resumption of [facility] RN and CPAP with [facility] (per pts choice). Romualdo Bolk of [facility] is aware and will collect the pts information from the chart. [facility] services to resume within 48 hours of discharge. CPAP will be delivered to pts home this pm. Pt and pts nurse aware of discharge arrangements. Sleep study arranged and pt is aware of appt time. Also  documented on AVS.  08/09/14 [place], RN BSN CM [facility] to check about qualifying pt for cpap/bipap and if new sleep study is required. Pt is aware that another sleep study may be required. Will continue to follow for discharge planning needs.   08/06/2014 [place], RN, MSN, PCCN Pt plans to discharge home, possibly over weekend, with [facility]. Romualdo Bolk of [facility] (per pt's request)  has been notified of pending discharge. Vaughan Basta will obtain pt's information from chart. Pt and RN made aware of how to contact [place] over weekend to notify them of discharge and DME needs. (Anticipate need for Bipap, final decision pending.) No further CM needs identified at this time.  08/05/2014 [place], RN, MSN, [facility]

## 2014-08-05 NOTE — Progress Notes (Signed)
Inpatient Diabetes Program Recommendations  AACE/ADA: New Consensus Statement on Inpatient Glycemic Control (2013)  Target Ranges:  Prepandial:   less than 140 mg/dL      Peak postprandial:   less than 180 mg/dL (1-2 hours)      Critically ill patients:  140 - 180 mg/dL   Results for CASIMIRO, BLUM (MRN [id number]) as of 08/05/2014 08:07  Ref. Range 08/04/2014 14:57 08/04/2014 16:02 08/04/2014 21:54 08/05/2014 07:22  Glucose-Capillary Latest Range: 70-99 mg/dL 55 (L) 115 (H) 314 (H) 289 (H)   Diabetes history: DM2 Outpatient Diabetes medications: Medtronic insulin pump with Humalog Current orders for Inpatient glycemic control: Novolog 0-9 units AC  Inpatient Diabetes Program Recommendations Insulin - Basal: Please consider ordering Lantus 27 units daily to start now (based on 135 kg x 0.2 units). Correction (SSI): Please conisder increasing Novolog correction to Resistant scale and adding Novolog bedtime correction scale. Insulin - Meal Coverage: If patient is eating, please consider ordering Novolog 5 units TID with meals.  Note: Initial glucose was 55 mg/dl with insulin pump on. Spoke with Justice Rocher, RN and she reports that patient does not have on his insulin pump as it was removed and sent home with his wife. In reviewing the chart, patient's insulin pump settings on May 10, 2014 were: Basal4 units/hour (Total basal insulin 96 units per day) Carb ratio1:4 (1 unit for every 4 grams of carbs) Insulin sensitivity1:12 (1 unit drops glucose 12 mg/dl) Target Glucose120 mg/dl  During last admission 07/14/14 to 07/17/14 patient was ordered: Lantus 25 units daily, Novolog 0-9 units AC, Novolog 0-5 units HS, Novolog 4 units TID with meals as an inpatient and CBGs ranged 180's-337 mg/dl. Please consider ordering Lantus 27 units daily, increasing Novolog correction to Resistant scale, ordering Novolog bedtime correction, and  adding Novolog 5 units TID with meals for meal coverage. Will continue to follow.  Thanks, Barnie Alderman, RN, MSN, CCRN, CDE Diabetes Coordinator Inpatient Diabetes Program [phone] (Team Pager) [phone] ([facility] office) [phone] [facility] office)

## 2014-08-05 NOTE — Progress Notes (Signed)
[facility] PROGRESS NOTE  Rick Cruz [id number] DOB: 1946-04-25 DOA: 08/04/2014 PCP: [name], MD  Assessment/Plan: 1. Acute on chronic respiratory failure. Multifactorial related to COPD, sleep apnea and congestive heart failure. Patient required BiPAP therapy on admission but has since improved. He is now back on nasal cannula. He chronically uses 3 L oxygen via of nasal cannula. We'll continue with current treatments. The patient was previously using a BiPAP at home, but had to return this since he did not "keep up with machine". Attempt at repeat sleep study was done earlier this week, patient was noted to be persistently hypoxic and was sent to the emergency room for evaluation. He will need a repeat sleep study to reinstitute positive pressure therapy. Will request pulmonary consultation to assist with his respiratory issues. He may be a candidate for a noninvasive ventilator. 2. Acute on chronic diastolic congestive heart failure. Started on intravenous Lasix with good urine output. Still appears to have excess volume. Continue beta blocker, intravenous Lasix. Continue to monitor intake and output. 3. COPD exacerbation. Continue intravenous steroids, antibiotics and bronchodilators for now. 4. Insulin-dependent diabetes. Patient uses an insulin pump at home. Will use Lantus and NovoLog therapy in the hospital based on recommendations from diabetes coordinator. 5. Obstructive sleep apnea. Currently is not receiving any positive pressure therapy at home. Will likely need a BiPAP as an outpatient. 6. Coronary artery disease. Continue aspirin and Plavix. No chest pain at this time. 7. Hyperkalemia. Will give 1 dose of Kayexalate today. Continue intravenous Lasix.  Code Status: full code Family Communication: discussed with patient  Disposition Plan: discharge home once improved   Consultants:    Procedures:    Antibiotics:  levaquin 11/18>>  HPI/Subjective: Feeling  better, he is coughing, required bipap overnight. Shortness of breath improving.  Objective: Filed Vitals:   08/05/14 0800  BP: 121/105  Pulse: 94  Temp:   Resp: 16    Intake/Output Summary (Last 24 hours) at 08/05/14 0924 Last data filed at 08/05/14 0600  Gross per 24 hour  Intake 669.83 ml  Output   2700 ml  Net -2030.17 ml   Filed Weights   08/04/14 1446 08/04/14 2035 08/05/14 0500  Weight: 136.533 kg (301 lb) 135.6 kg (298 lb 15.1 oz) 135.4 kg (298 lb 8.1 oz)    Exam:   General:  NAD  Cardiovascular: s1, s2, rrr  Respiratory: crackles in bilateral lung fields, no significant wheezing  Abdomen: soft, obese, nt, bs+  Musculoskeletal: changes of chronic venous stasis with 1+ edema b/l   Data Reviewed: Basic Metabolic Panel:  Recent Labs Lab 08/04/14 1525 08/05/14 0422  NA 143 139  K 5.5* 6.0*  CL 96 92*  CO2 42* 42*  GLUCOSE 91 310*  BUN 5* 9  CREATININE 0.95 0.97  CALCIUM 9.1 9.0   Liver Function Tests:  Recent Labs Lab 08/05/14 0422  AST 8  ALT 8  ALKPHOS 101  BILITOT 0.3  PROT 7.0  ALBUMIN 3.3*   No results for input(s): LIPASE, AMYLASE in the last 168 hours. No results for input(s): AMMONIA in the last 168 hours. CBC:  Recent Labs Lab 08/04/14 1525 08/05/14 0422  WBC 9.4 9.6  HGB 11.2* 11.2*  HCT 40.3 39.6  MCV 97.1 95.7  PLT 213 196   Cardiac Enzymes:  Recent Labs Lab 08/04/14 1525  TROPONINI <0.30   BNP (last 3 results)  Recent Labs  07/14/14 1830 07/21/14 0143 08/04/14 1525  PROBNP 211.1* 99.9 277.3*  CBG:  Recent Labs Lab 08/04/14 1457 08/04/14 1602 08/04/14 2154 08/05/14 0722  GLUCAP 55* 115* 314* 289*    No results found for this or any previous visit (from the past 240 hour(s)).   Studies: Dg Chest 2 View  08/04/2014   CLINICAL DATA:  68 year old with shortness of breath  EXAM: CHEST  2 VIEW  COMPARISON:  07/21/2014  FINDINGS: The cardiac silhouette is enlarged. The mediastinal contours are  unchanged. Atherosclerotic aortic calcifications are present.  There is prominence of the interstitial markings and pulmonary vasculature. Small to moderate bilateral pleural effusions are present, left greater than right. There is no pneumothorax. There is no focal airspace consolidation.  Mild degenerative changes of the spine are noted.  IMPRESSION: 1. Cardiomegaly with mild interstitial pulmonary edema. 2. Bilateral pleural effusions.   Electronically Signed   By: Rosemarie Ax   On: 08/04/2014 15:47    Scheduled Meds: . aspirin  81 mg Oral q morning - 10a  . brimonidine  1 drop Right Eye BID  . citalopram  20 mg Oral q morning - 10a  . clopidogrel  75 mg Oral q morning - 10a  . enoxaparin (LOVENOX) injection  70 mg Subcutaneous Q24H  . fentaNYL  50 mcg Transdermal Q3 days  . furosemide  20 mg Intravenous BID  . gabapentin  300 mg Oral TID  . guaiFENesin  1,200 mg Oral BID  . insulin aspart  0-20 Units Subcutaneous TID WC  . insulin aspart  0-5 Units Subcutaneous QHS  . insulin aspart  5 Units Subcutaneous TID WC  . insulin glargine  27 Units Subcutaneous Daily  . ipratropium-albuterol  3 mL Nebulization Q4H  . levofloxacin (LEVAQUIN) IV  500 mg Intravenous Q24H  . methylPREDNISolone (SOLU-MEDROL) injection  60 mg Intravenous Q6H  . metoprolol  50 mg Oral BID  . pantoprazole  40 mg Oral BID  . simvastatin  40 mg Oral QPM  . sodium chloride  3 mL Intravenous Q12H  . sodium polystyrene  30 g Oral Once  . terazosin  1 mg Oral QHS  . ziprasidone  80 mg Oral QPM   Continuous Infusions:   Active Problems:   Obstructive sleep apnea   COPD exacerbation   CAD (coronary artery disease)   DM (diabetes mellitus)   Hyperkalemia   Acute respiratory failure with hypercapnia   COPD with acute exacerbation    Time spent: 30 mins    [place] [facility] Pager [phone]. If 7PM-7AM, please contact night-coverage at [email or web address], password [facility] 08/05/2014, 9:24 AM  LOS:  1 day

## 2014-08-05 NOTE — Care Management Utilization Note (Signed)
UR review complete.  

## 2014-08-06 DIAGNOSIS — I5032 Chronic diastolic (congestive) heart failure: Secondary | ICD-10-CM | POA: Diagnosis present

## 2014-08-06 DIAGNOSIS — G9341 Metabolic encephalopathy: Secondary | ICD-10-CM | POA: Diagnosis present

## 2014-08-06 LAB — BLOOD GAS, ARTERIAL
ACID-BASE EXCESS: 17.2 mmol/L — AB (ref 0.0–2.0)
BICARBONATE: 44 meq/L — AB (ref 20.0–24.0)
Drawn by: 22223
O2 Content: 3 L/min
O2 SAT: 90.8 %
PATIENT TEMPERATURE: 37
TCO2: 41.2 mmol/L (ref 0–100)
pCO2 arterial: 87.2 mmHg (ref 35.0–45.0)
pH, Arterial: 7.323 — ABNORMAL LOW (ref 7.350–7.450)
pO2, Arterial: 66.7 mmHg — ABNORMAL LOW (ref 80.0–100.0)

## 2014-08-06 LAB — GLUCOSE, CAPILLARY
GLUCOSE-CAPILLARY: 267 mg/dL — AB (ref 70–99)
GLUCOSE-CAPILLARY: 245 mg/dL — AB (ref 70–99)
GLUCOSE-CAPILLARY: 245 mg/dL — AB (ref 70–99)
Glucose-Capillary: 240 mg/dL — ABNORMAL HIGH (ref 70–99)
Glucose-Capillary: 308 mg/dL — ABNORMAL HIGH (ref 70–99)

## 2014-08-06 LAB — CBC
HCT: 36.2 % — ABNORMAL LOW (ref 39.0–52.0)
HEMOGLOBIN: 10.4 g/dL — AB (ref 13.0–17.0)
MCH: 26.9 pg (ref 26.0–34.0)
MCHC: 28.7 g/dL — AB (ref 30.0–36.0)
MCV: 93.8 fL (ref 78.0–100.0)
Platelets: 189 10*3/uL (ref 150–400)
RBC: 3.86 MIL/uL — ABNORMAL LOW (ref 4.22–5.81)
RDW: 14 % (ref 11.5–15.5)
WBC: 11.9 10*3/uL — ABNORMAL HIGH (ref 4.0–10.5)

## 2014-08-06 LAB — BASIC METABOLIC PANEL
Anion gap: 5 (ref 5–15)
BUN: 16 mg/dL (ref 6–23)
CHLORIDE: 89 meq/L — AB (ref 96–112)
CO2: 44 meq/L — AB (ref 19–32)
Calcium: 8.9 mg/dL (ref 8.4–10.5)
Creatinine, Ser: 1.01 mg/dL (ref 0.50–1.35)
GFR calc Af Amer: 87 mL/min — ABNORMAL LOW (ref >90)
GFR calc non Af Amer: 75 mL/min — ABNORMAL LOW (ref >90)
GLUCOSE: 239 mg/dL — AB (ref 70–99)
POTASSIUM: 5.2 meq/L (ref 3.7–5.3)
Sodium: 138 mEq/L (ref 137–147)

## 2014-08-06 MED ORDER — LEVOFLOXACIN 500 MG PO TABS
500.0000 mg | ORAL_TABLET | Freq: Every day | ORAL | Status: DC
  Administered 2014-08-06 – 2014-08-08 (×3): 500 mg via ORAL
  Filled 2014-08-06 (×3): qty 1

## 2014-08-06 NOTE — Progress Notes (Signed)
[facility] PROGRESS NOTE  Rick Cruz [id number] DOB: 08/03/1946 DOA: 08/04/2014 PCP: [name], MD  Assessment/Plan: 1. Acute on chronic respiratory failure. Multifactorial related to COPD, OSA/OHS and congestive heart failure. Patient required BiPAP therapy on admission but refused it overnight. He is now back on nasal cannula. He chronically uses 3 L oxygen via of nasal cannula. We'll continue with current treatments. PCO2 has actually trended up since admission. He is confused and sleepy. After discussion with patient, he is willing to try bipap today. Will try it during the day today and again overnight. 2. Acute on chronic diastolic congestive heart failure. O intravenous Lasix with good urine output. Can likely transition to po lasix tomorrow. Continue BB. 3. COPD exacerbation. Continue intravenous steroids, antibiotics and bronchodilators for now. Appreciate pulmonology assitance 4. Insulin-dependent diabetes. Patient uses an insulin pump at home. Will use Lantus and NovoLog therapy in the hospital based on recommendations from diabetes coordinator. 5. Obstructive sleep apnea/obesity hypoventilation syndrome. He is not receiving any positive pressure therapy at home. Will likely need a BiPAP as an outpatient. 6. Coronary artery disease. Continue aspirin and Plavix. No chest pain at this time. 7. Hyperkalemia. Improved with kayexalate. Continue intravenous Lasix.  Code Status: full code Family Communication: discussed with patient's wife over the phone  Disposition Plan: discharge home once improved   Consultants:  Pulmonology  Procedures:    Antibiotics:  levaquin 11/18>>  HPI/Subjective: Feels that breathing is improving, although he is confused, refused bipap overnight  Objective: Filed Vitals:   08/06/14 1103  BP:   Pulse:   Temp: 98.4 F (36.9 C)  Resp:     Intake/Output Summary (Last 24 hours) at 08/06/14 1115 Last data filed at 08/06/14 1057   Gross per 24 hour  Intake   1050 ml  Output   3700 ml  Net  -2650 ml   Filed Weights   08/04/14 2035 08/05/14 0500 08/06/14 0500  Weight: 135.6 kg (298 lb 15.1 oz) 135.4 kg (298 lb 8.1 oz) 133.7 kg (294 lb 12.1 oz)    Exam:   General:  NAD, mildly confused  Cardiovascular: s1, s2, rrr  Respiratory: improved air movement no significant wheezing  Abdomen: soft, obese, nt, bs+  Musculoskeletal: changes of chronic venous stasis with 1+ edema b/l   Data Reviewed: Basic Metabolic Panel:  Recent Labs Lab 08/04/14 1525 08/05/14 0422 08/06/14 0426  NA 143 139 138  K 5.5* 6.0* 5.2  CL 96 92* 89*  CO2 42* 42* 44*  GLUCOSE 91 310* 239*  BUN 5* 9 16  CREATININE 0.95 0.97 1.01  CALCIUM 9.1 9.0 8.9   Liver Function Tests:  Recent Labs Lab 08/05/14 0422  AST 8  ALT 8  ALKPHOS 101  BILITOT 0.3  PROT 7.0  ALBUMIN 3.3*   No results for input(s): LIPASE, AMYLASE in the last 168 hours. No results for input(s): AMMONIA in the last 168 hours. CBC:  Recent Labs Lab 08/04/14 1525 08/05/14 0422 08/06/14 0426  WBC 9.4 9.6 11.9*  HGB 11.2* 11.2* 10.4*  HCT 40.3 39.6 36.2*  MCV 97.1 95.7 93.8  PLT 213 196 189   Cardiac Enzymes:  Recent Labs Lab 08/04/14 1525  TROPONINI <0.30   BNP (last 3 results)  Recent Labs  07/14/14 1830 07/21/14 0143 08/04/14 1525  PROBNP 211.1* 99.9 277.3*   CBG:  Recent Labs Lab 08/05/14 1115 08/05/14 1614 08/05/14 2111 08/06/14 0515 08/06/14 0814  GLUCAP 345* 351* 373* 240* 267*    No  results found for this or any previous visit (from the past 240 hour(s)).   Studies: Dg Chest 2 View  08/04/2014   CLINICAL DATA:  68 year old with shortness of breath  EXAM: CHEST  2 VIEW  COMPARISON:  07/21/2014  FINDINGS: The cardiac silhouette is enlarged. The mediastinal contours are unchanged. Atherosclerotic aortic calcifications are present.  There is prominence of the interstitial markings and pulmonary vasculature. Small to  moderate bilateral pleural effusions are present, left greater than right. There is no pneumothorax. There is no focal airspace consolidation.  Mild degenerative changes of the spine are noted.  IMPRESSION: 1. Cardiomegaly with mild interstitial pulmonary edema. 2. Bilateral pleural effusions.   Electronically Signed   By: Rosemarie Ax   On: 08/04/2014 15:47    Scheduled Meds: . aspirin  81 mg Oral q morning - 10a  . brimonidine  1 drop Right Eye BID  . citalopram  20 mg Oral q morning - 10a  . clopidogrel  75 mg Oral q morning - 10a  . enoxaparin (LOVENOX) injection  70 mg Subcutaneous Q24H  . fentaNYL  50 mcg Transdermal Q3 days  . furosemide  20 mg Intravenous BID  . gabapentin  300 mg Oral TID  . guaiFENesin  1,200 mg Oral BID  . insulin aspart  0-20 Units Subcutaneous TID WC  . insulin aspart  0-5 Units Subcutaneous QHS  . insulin aspart  5 Units Subcutaneous TID WC  . insulin glargine  27 Units Subcutaneous Daily  . ipratropium-albuterol  3 mL Nebulization Q4H WA  . levofloxacin  500 mg Oral q1800  . methylPREDNISolone (SOLU-MEDROL) injection  60 mg Intravenous Q6H  . metoprolol  50 mg Oral BID  . pantoprazole  40 mg Oral BID  . simvastatin  40 mg Oral QPM  . sodium chloride  3 mL Intravenous Q12H  . terazosin  1 mg Oral QHS  . ziprasidone  80 mg Oral QPM   Continuous Infusions:   Active Problems:   Obstructive sleep apnea   COPD exacerbation   CAD (coronary artery disease)   DM (diabetes mellitus)   Hyperkalemia   Acute respiratory failure with hypercapnia   COPD with acute exacerbation    Time spent: 30 mins    [place] [facility] Pager [phone]. If 7PM-7AM, please contact night-coverage at [email or web address], password [facility] 08/06/2014, 11:15 AM  LOS: 2 days

## 2014-08-06 NOTE — Consult Note (Signed)
Consult requested by: [facility] Consult requested for respiratory failure:  HPI: This is a 68 year old who has a long known history of COPD. He came to the emergency department because of increasing shortness of breath. He is chronically short of breath but it was worse. He has chronic respiratory failure on home oxygen. He said he been coughing up some clear sputum. He's not had any fever or chest pain. When he was seen in the emergency room he was found to be in acute hypercapnic respiratory failure with a PCO2 of 80.4 he says he feels better but his PCO2 is actually more elevated today. He has other medical problems including congestive heart failure schizophrenia chronic pain and diabetes. He has a diagnosis of sleep apnea but was unable to finish a sleep study and apparently he is not compliant. I can't get a clear answer whether he has C Pap/ BiPAP at home.  Past Medical History  Diagnosis Date  . COPD (chronic obstructive pulmonary disease)   . CHF (congestive heart failure)     diastolic   . Hypercholesterolemia   . Depression   . Schizophrenia   . Sleep apnea     noncompliant with BiPAP  . Tobacco abuse   . On home O2     3 liters  . Chronic pain   . Diabetes mellitus without complication     insulin pump     Family History  Problem Relation Age of Onset  . Colon cancer Neg Hx   . Parkinson's disease    . Thyroid disease Mother   . Parkinson's disease Father      History   Social History  . Marital Status: Married    Spouse Name: N/A    Number of Children: N/A  . Years of Education: N/A   Social History Main Topics  . Smoking status: Former Smoker -- 1.00 packs/day for 50 years    Types: Cigarettes    Quit date: 09/16/2013  . Smokeless tobacco: Never Used  . Alcohol Use: No     Comment: history of ETOH abuse in remote past, none in at least 10 years  . Drug Use: No  . Sexual Activity: None   Other Topics Concern  . None   Social History Narrative      ROS: He denies chest pain fever chills nausea vomiting. The rest is per the history and physical    Objective: Vital signs in last 24 hours: Temp:  [96.5 F (35.8 C)-98.4 F (36.9 C)] 98.4 F (36.9 C) (11/20 0822) Pulse Rate:  [38-106] 38 (11/20 0500) Resp:  [9-24] 10 (11/20 0500) BP: (99-158)/(56-86) 134/79 mmHg (11/20 0500) SpO2:  [84 %-100 %] 100 % (11/20 0749) FiO2 (%):  [93 %] 93 % (11/19 1116) Weight:  [133.7 kg (294 lb 12.1 oz)] 133.7 kg (294 lb 12.1 oz) (11/20 0500) Weight change: -2.833 kg (-6 lb 3.9 oz)    Intake/Output from previous day: 11/19 0701 - 11/20 0700 In: 7 [P.O.:480; I.V.:230; IV Piggyback:100] Out: 4350 [Urine:4350]  PHYSICAL EXAM He is an obese male who is awake and alert. He is in no distress. His pupils are reactiveand throat are clear mucous membranes are moist his neck is supple without masses. His chest shows rhonchi bilaterally. Heart is regular without gallop. Abdomen is soft no mass felt extremities showed no edema  Lab Results: Basic Metabolic Panel:  Recent Labs  08/05/14 0422 08/06/14 0426  NA 139 138  K 6.0* 5.2  CL 92* 89*  CO2 42* 44*  GLUCOSE 310* 239*  BUN 9 16  CREATININE 0.97 1.01  CALCIUM 9.0 8.9   Liver Function Tests:  Recent Labs  08/05/14 0422  AST 8  ALT 8  ALKPHOS 101  BILITOT 0.3  PROT 7.0  ALBUMIN 3.3*   No results for input(s): LIPASE, AMYLASE in the last 72 hours. No results for input(s): AMMONIA in the last 72 hours. CBC:  Recent Labs  08/05/14 0422 08/06/14 0426  WBC 9.6 11.9*  HGB 11.2* 10.4*  HCT 39.6 36.2*  MCV 95.7 93.8  PLT 196 189   Cardiac Enzymes:  Recent Labs  08/04/14 1525  TROPONINI <0.30   BNP:  Recent Labs  08/04/14 1525  PROBNP 277.3*   D-Dimer: No results for input(s): DDIMER in the last 72 hours. CBG:  Recent Labs  08/05/14 0722 08/05/14 1115 08/05/14 1614 08/05/14 2111 08/06/14 0515 08/06/14 0814  GLUCAP 289* 345* 351* 373* 240* 267*    Hemoglobin A1C: No results for input(s): HGBA1C in the last 72 hours. Fasting Lipid Panel: No results for input(s): CHOL, HDL, LDLCALC, TRIG, CHOLHDL, LDLDIRECT in the last 72 hours. Thyroid Function Tests: No results for input(s): TSH, T4TOTAL, FREET4, T3FREE, THYROIDAB in the last 72 hours. Anemia Panel: No results for input(s): VITAMINB12, FOLATE, FERRITIN, TIBC, IRON, RETICCTPCT in the last 72 hours. Coagulation: No results for input(s): LABPROT, INR in the last 72 hours. Urine Drug Screen: Drugs of Abuse     Component Value Date/Time   LABOPIA NONE DETECTED 07/21/2014 0310   COCAINSCRNUR NONE DETECTED 07/21/2014 0310   LABBENZ NONE DETECTED 07/21/2014 0310   AMPHETMU NONE DETECTED 07/21/2014 0310   THCU NONE DETECTED 07/21/2014 0310   LABBARB NONE DETECTED 07/21/2014 0310    Alcohol Level: No results for input(s): ETH in the last 72 hours. Urinalysis: No results for input(s): COLORURINE, LABSPEC, PHURINE, GLUCOSEU, HGBUR, BILIRUBINUR, KETONESUR, PROTEINUR, UROBILINOGEN, NITRITE, LEUKOCYTESUR in the last 72 hours.  Invalid input(s): APPERANCEUR Misc. Labs:   ABGS:  Recent Labs  08/06/14 0455  PHART 7.323*  PO2ART 66.7*  TCO2 41.2  HCO3 44.0*     MICROBIOLOGY: No results found for this or any previous visit (from the past 240 hour(s)).  Studies/Results: Dg Chest 2 View  08/04/2014   CLINICAL DATA:  68 year old with shortness of breath  EXAM: CHEST  2 VIEW  COMPARISON:  07/21/2014  FINDINGS: The cardiac silhouette is enlarged. The mediastinal contours are unchanged. Atherosclerotic aortic calcifications are present.  There is prominence of the interstitial markings and pulmonary vasculature. Small to moderate bilateral pleural effusions are present, left greater than right. There is no pneumothorax. There is no focal airspace consolidation.  Mild degenerative changes of the spine are noted.  IMPRESSION: 1. Cardiomegaly with mild interstitial pulmonary edema. 2.  Bilateral pleural effusions.   Electronically Signed   By: Rosemarie Ax   On: 08/04/2014 15:47    Medications:  Prior to Admission:  Prescriptions prior to admission  Medication Sig Dispense Refill Last Dose  . albuterol (PROVENTIL) (2.5 MG/3ML) 0.083% nebulizer solution Take 3 mLs (2.5 mg total) by nebulization every 6 (six) hours as needed for wheezing or shortness of breath. 75 mL 0 08/03/2014 at Unknown time  . aspirin 81 MG chewable tablet Chew 81 mg by mouth every morning.    08/04/2014 at Unknown time  . brimonidine (ALPHAGAN) 0.15 % ophthalmic solution Place 1 drop into the right eye 2 (two) times daily.    08/04/2014 at Unknown time  .  Cinnamon 500 MG capsule Take 1,000 mg by mouth every morning.    08/04/2014 at Unknown time  . citalopram (CELEXA) 20 MG tablet Take 20 mg by mouth every morning.   08/04/2014 at Unknown time  . clopidogrel (PLAVIX) 75 MG tablet Take 75 mg by mouth every morning.   08/04/2014 at Unknown time  . fentaNYL (DURAGESIC - DOSED MCG/HR) 50 MCG/HR Place 50 mcg onto the skin every 3 (three) days.  0 Past Week at Unknown time  . furosemide (LASIX) 40 MG tablet Take 40 mg by mouth daily.   08/04/2014 at Unknown time  . gabapentin (NEURONTIN) 300 MG capsule Take 300 mg by mouth 3 (three) times daily.   08/04/2014 at Unknown time  . Insulin Human (INSULIN PUMP) SOLN Inject into the skin continuous. Humalog   08/04/2014 at Unknown time  . metoprolol (LOPRESSOR) 50 MG tablet Take 50 mg by mouth 2 (two) times daily.   08/04/2014 at 800  . pantoprazole (PROTONIX) 40 MG tablet Take 40 mg by mouth 2 (two) times daily.   08/04/2014 at Unknown time  . simvastatin (ZOCOR) 40 MG tablet Take 40 mg by mouth every evening.   08/03/2014 at Unknown time  . terazosin (HYTRIN) 1 MG capsule Take 1 mg by mouth at bedtime.   08/03/2014 at Unknown time  . tiotropium (SPIRIVA HANDIHALER) 18 MCG inhalation capsule Place 1 capsule (18 mcg total) into inhaler and inhale every morning. 30  capsule 12 08/04/2014 at Unknown time  . traZODone (DESYREL) 150 MG tablet Take 150 mg by mouth at bedtime.    08/03/2014 at Unknown time  . triamcinolone cream (KENALOG) 0.1 % Apply 1 application topically daily as needed (for irritation).    Past Week at Unknown time  . ziprasidone (GEODON) 80 MG capsule Take 80 mg by mouth every evening.   08/03/2014 at Unknown time  . albuterol (PROVENTIL HFA;VENTOLIN HFA) 108 (90 BASE) MCG/ACT inhaler Inhale 2 puffs into the lungs every 6 (six) hours as needed for wheezing or shortness of breath. 1 Inhaler 2 unknown  . fentaNYL (DURAGESIC - DOSED MCG/HR) 12 MCG/HR Place 1 patch (12.5 mcg total) onto the skin every 3 (three) days. (Patient not taking: Reported on 08/04/2014) 5 patch 0   . LORazepam (ATIVAN) 1 MG tablet Take 0.5 tablets (0.5 mg total) by mouth 2 (two) times daily as needed for anxiety. (Patient not taking: Reported on 08/04/2014) 30 tablet 0   . predniSONE (DELTASONE) 20 MG tablet Take 2 tablets (40 mg total) by mouth daily before breakfast. (Patient not taking: Reported on 08/04/2014) 6 tablet 0    Scheduled: . aspirin  81 mg Oral q morning - 10a  . brimonidine  1 drop Right Eye BID  . citalopram  20 mg Oral q morning - 10a  . clopidogrel  75 mg Oral q morning - 10a  . enoxaparin (LOVENOX) injection  70 mg Subcutaneous Q24H  . fentaNYL  50 mcg Transdermal Q3 days  . furosemide  20 mg Intravenous BID  . gabapentin  300 mg Oral TID  . guaiFENesin  1,200 mg Oral BID  . insulin aspart  0-20 Units Subcutaneous TID WC  . insulin aspart  0-5 Units Subcutaneous QHS  . insulin aspart  5 Units Subcutaneous TID WC  . insulin glargine  27 Units Subcutaneous Daily  . ipratropium-albuterol  3 mL Nebulization Q4H WA  . levofloxacin (LEVAQUIN) IV  500 mg Intravenous Q24H  . methylPREDNISolone (SOLU-MEDROL) injection  60 mg Intravenous  Q6H  . metoprolol  50 mg Oral BID  . pantoprazole  40 mg Oral BID  . simvastatin  40 mg Oral QPM  . sodium chloride   3 mL Intravenous Q12H  . terazosin  1 mg Oral QHS  . ziprasidone  80 mg Oral QPM   Continuous:  [id number] chloride, albuterol, HYDROcodone-acetaminophen, LORazepam, ondansetron **OR** ondansetron (ZOFRAN) IV, sodium chloride  Assesment: He was admitted with COPD exacerbation and acute hypercapnic respiratory failure. He has generally been refusing to use BiPAP here in the hospital. His PCO2 is actually higher than on admission but he is awake and able to have conversation. I think he does have obstructive sleep apnea/obesity hypoventilation. Active Problems:   Obstructive sleep apnea   COPD exacerbation   CAD (coronary artery disease)   DM (diabetes mellitus)   Hyperkalemia   Acute respiratory failure with hypercapnia   COPD with acute exacerbation    Plan: Continue current treatments. I agree with steroids. BiPAP if he will use it.    LOS: 2 days   Nicholes Hibler L 08/06/2014, 8:34 AM

## 2014-08-06 NOTE — Progress Notes (Signed)
Inpatient Diabetes Program Recommendations  AACE/ADA: New Consensus Statement on Inpatient Glycemic Control (2013)  Target Ranges:  Prepandial:   less than 140 mg/dL      Peak postprandial:   less than 180 mg/dL (1-2 hours)      Critically ill patients:  140 - 180 mg/dL   Results for Rick Cruz, Rick Cruz (MRN [id number]) as of 08/06/2014 15:20  Ref. Range 08/05/2014 16:14 08/05/2014 21:11 08/06/2014 05:15 08/06/2014 08:14 08/06/2014 11:21  Glucose-Capillary Latest Range: 70-99 mg/dL 351 (H) 373 (H) 240 (H) 267 (H) 308 (H)    Diabetes history: DM2 Outpatient Diabetes medications: [employer] insulin pump with Humalog basal dose 96units/24 hours Basal4 units/hour (Total basal insulin 96 units per day) Carb ratio1:4 (1 unit for every 4 grams of carbs) Insulin sensitivity1:12 (1 unit drops glucose 12 mg/dl) Target Glucose120 mg/dl  Current orders;   Lantus 27 units daily to start now (based on 135 kg x 0.2 units).  Novolog resistant correction scale and Novolog bedtime correction scale.  Novolog 5 units TID with meals.   Please increase basal Lantus to 48 units daily (half of his home basal rate) and increasing mealtime insulin to 8-10 units tid.   Gentry Fitz, RN, BA, MHA, CDE Diabetes Coordinator Inpatient Diabetes Program  [phone] (Team Pager) [phone] [facility]) 08/06/2014 3:21 PM

## 2014-08-06 NOTE — Progress Notes (Signed)
PHARMACIST - PHYSICIAN COMMUNICATION DR:   Hawkins CONCERNING: Antibiotic IV to Oral Route Change Policy  RECOMMENDATION: This patient is receiving Levaquin by the intravenous route.  Based on criteria approved by the Pharmacy and Therapeutics Committee, the antibiotic(s) is/are being converted to the equivalent oral dose form(s).  DESCRIPTION: These criteria include:  Patient being treated for a respiratory tract infection, urinary tract infection, cellulitis or clostridium difficile associated diarrhea if on metronidazole  The patient is not neutropenic and does not exhibit a GI malabsorption state  The patient is eating (either orally or via tube) and/or has been taking other orally administered medications for a least 24 hours  The patient is improving clinically and has a Tmax < 100.5  If you have questions about this conversion, please contact the Pharmacy Department  [x]  ( [phone] )  [facility] []  ( [phone]  [place]  []  ( [phone]  [facility] []  ( [phone] )  [facility]   S. Akiko Schexnider, PharmD  

## 2014-08-06 NOTE — Progress Notes (Signed)
Patient has refused to wear his Bipap since admission. Respiratory has attempted to make wearing the Bipap more comfortable and medication was given to help patient relax but he still refused to wear it.

## 2014-08-07 DIAGNOSIS — G9341 Metabolic encephalopathy: Secondary | ICD-10-CM

## 2014-08-07 DIAGNOSIS — I5033 Acute on chronic diastolic (congestive) heart failure: Secondary | ICD-10-CM

## 2014-08-07 DIAGNOSIS — J9622 Acute and chronic respiratory failure with hypercapnia: Secondary | ICD-10-CM

## 2014-08-07 LAB — GLUCOSE, CAPILLARY
GLUCOSE-CAPILLARY: 221 mg/dL — AB (ref 70–99)
GLUCOSE-CAPILLARY: 260 mg/dL — AB (ref 70–99)
Glucose-Capillary: 201 mg/dL — ABNORMAL HIGH (ref 70–99)
Glucose-Capillary: 229 mg/dL — ABNORMAL HIGH (ref 70–99)

## 2014-08-07 LAB — CBC
HEMATOCRIT: 38.7 % — AB (ref 39.0–52.0)
HEMOGLOBIN: 11.5 g/dL — AB (ref 13.0–17.0)
MCH: 27.1 pg (ref 26.0–34.0)
MCHC: 29.7 g/dL — AB (ref 30.0–36.0)
MCV: 91.3 fL (ref 78.0–100.0)
Platelets: 191 10*3/uL (ref 150–400)
RBC: 4.24 MIL/uL (ref 4.22–5.81)
RDW: 13.8 % (ref 11.5–15.5)
WBC: 9.8 10*3/uL (ref 4.0–10.5)

## 2014-08-07 LAB — BASIC METABOLIC PANEL
Anion gap: 9 (ref 5–15)
BUN: 21 mg/dL (ref 6–23)
CHLORIDE: 85 meq/L — AB (ref 96–112)
CO2: 41 meq/L — AB (ref 19–32)
Calcium: 8.8 mg/dL (ref 8.4–10.5)
Creatinine, Ser: 0.88 mg/dL (ref 0.50–1.35)
GFR calc non Af Amer: 87 mL/min — ABNORMAL LOW (ref >90)
Glucose, Bld: 222 mg/dL — ABNORMAL HIGH (ref 70–99)
Potassium: 4.5 mEq/L (ref 3.7–5.3)
Sodium: 135 mEq/L — ABNORMAL LOW (ref 137–147)

## 2014-08-07 LAB — BLOOD GAS, ARTERIAL
Acid-Base Excess: 14.9 mmol/L — ABNORMAL HIGH (ref 0.0–2.0)
BICARBONATE: 39.9 meq/L — AB (ref 20.0–24.0)
Drawn by: 23534
O2 Content: 3 L/min
O2 Saturation: 91.6 %
Patient temperature: 37
TCO2: 36.1 mmol/L (ref 0–100)
pCO2 arterial: 59 mmHg (ref 35.0–45.0)
pH, Arterial: 7.445 (ref 7.350–7.450)
pO2, Arterial: 61.7 mmHg — ABNORMAL LOW (ref 80.0–100.0)

## 2014-08-07 MED ORDER — CHLORHEXIDINE GLUCONATE 0.12 % MT SOLN
15.0000 mL | Freq: Two times a day (BID) | OROMUCOSAL | Status: DC
  Administered 2014-08-07 – 2014-08-10 (×7): 15 mL via OROMUCOSAL
  Filled 2014-08-07 (×7): qty 15

## 2014-08-07 MED ORDER — CETYLPYRIDINIUM CHLORIDE 0.05 % MT LIQD
7.0000 mL | Freq: Two times a day (BID) | OROMUCOSAL | Status: DC
  Administered 2014-08-07 – 2014-08-09 (×6): 7 mL via OROMUCOSAL

## 2014-08-07 MED ORDER — METHYLPREDNISOLONE SODIUM SUCC 125 MG IJ SOLR
60.0000 mg | Freq: Two times a day (BID) | INTRAMUSCULAR | Status: DC
  Administered 2014-08-07 – 2014-08-09 (×5): 60 mg via INTRAVENOUS
  Filled 2014-08-07 (×5): qty 2

## 2014-08-07 NOTE — Progress Notes (Addendum)
[facility] PROGRESS NOTE  Rick Cruz [id number] DOB: March 27, 1946 DOA: 08/04/2014 PCP: [name], MD  Assessment/Plan: 1. Acute on chronic respiratory failure. Multifactorial related to COPD, OSA/OHS and congestive heart failure. ABG appears to be improving today since patient wore bipap last night. Will continue QHS bipap. Continue chronic oxygen supplementation. 2. Acute on chronic diastolic congestive heart failure. On intravenous Lasix with good urine output. Appears to be approaching euvolemia. Will change to po lasix. Continue BB. 3. COPD exacerbation. Wheezing improving. Will de escalate steroids. Continue abx and bronchodilators. Appreciate pulmonology assitance 4. Insulin-dependent diabetes. Patient uses an insulin pump at home. Will use Lantus and NovoLog therapy in the hospital based on recommendations from diabetes coordinator. 5. Obstructive sleep apnea/obesity hypoventilation syndrome. He is not receiving any positive pressure therapy at home. Will likely need a BiPAP as an outpatient. 6. Coronary artery disease. Continue aspirin and Plavix. No chest pain at this time. 7. Hyperkalemia. Improved with kayexalate.  8. Metabolic encephalopathy related to hypercapnea, improved with bipap therapy  Code Status: full code Family Communication: discussed with patient's wife over the phone  Disposition Plan: discharge home once improved. Transfer to telemetry today.   Consultants:  Pulmonology  Procedures:    Antibiotics:  levaquin 11/18>>  HPI/Subjective: Patient is more awake and alert today. Feels that breathing is improving, cough is improving  Objective: Filed Vitals:   08/07/14 0900  BP:   Pulse: 75  Temp:   Resp: 21    Intake/Output Summary (Last 24 hours) at 08/07/14 0928 Last data filed at 08/07/14 0900  Gross per 24 hour  Intake   1933 ml  Output   3950 ml  Net  -2017 ml   Filed Weights   08/06/14 0500 08/07/14 0600 08/07/14 0700   Weight: 133.7 kg (294 lb 12.1 oz) 133.2 kg (293 lb 10.4 oz) 133 kg (293 lb 3.4 oz)    Exam:   General:  NAD, mildly confused  Cardiovascular: s1, s2, rrr  Respiratory: fair air movement bilaterally with no wheezing or crackles  Abdomen: soft, obese, nt, bs+  Musculoskeletal: changes of chronic venous stasis with no edema b/l   Data Reviewed: Basic Metabolic Panel:  Recent Labs Lab 08/04/14 1525 08/05/14 0422 08/06/14 0426 08/07/14 0834  NA 143 139 138 135*  K 5.5* 6.0* 5.2 4.5  CL 96 92* 89* 85*  CO2 42* 42* 44* 41*  GLUCOSE 91 310* 239* 222*  BUN 5* 9 16 21   CREATININE 0.95 0.97 1.01 0.88  CALCIUM 9.1 9.0 8.9 8.8   Liver Function Tests:  Recent Labs Lab 08/05/14 0422  AST 8  ALT 8  ALKPHOS 101  BILITOT 0.3  PROT 7.0  ALBUMIN 3.3*   No results for input(s): LIPASE, AMYLASE in the last 168 hours. No results for input(s): AMMONIA in the last 168 hours. CBC:  Recent Labs Lab 08/04/14 1525 08/05/14 0422 08/06/14 0426 08/07/14 0834  WBC 9.4 9.6 11.9* 9.8  HGB 11.2* 11.2* 10.4* 11.5*  HCT 40.3 39.6 36.2* 38.7*  MCV 97.1 95.7 93.8 91.3  PLT 213 196 189 191   Cardiac Enzymes:  Recent Labs Lab 08/04/14 1525  TROPONINI <0.30   BNP (last 3 results)  Recent Labs  07/14/14 1830 07/21/14 0143 08/04/14 1525  PROBNP 211.1* 99.9 277.3*   CBG:  Recent Labs Lab 08/06/14 0814 08/06/14 1121 08/06/14 1641 08/06/14 2107 08/07/14 0826  GLUCAP 267* 308* 245* 245* 201*    No results found for this or any previous visit (  from the past 240 hour(s)).   Studies: No results found.  Scheduled Meds: . antiseptic oral rinse  7 mL Mouth Rinse q12n4p  . aspirin  81 mg Oral q morning - 10a  . brimonidine  1 drop Right Eye BID  . chlorhexidine  15 mL Mouth Rinse BID  . citalopram  20 mg Oral q morning - 10a  . clopidogrel  75 mg Oral q morning - 10a  . enoxaparin (LOVENOX) injection  70 mg Subcutaneous Q24H  . fentaNYL  50 mcg Transdermal Q3 days  .  gabapentin  300 mg Oral TID  . guaiFENesin  1,200 mg Oral BID  . insulin aspart  0-20 Units Subcutaneous TID WC  . insulin aspart  0-5 Units Subcutaneous QHS  . insulin aspart  5 Units Subcutaneous TID WC  . insulin glargine  27 Units Subcutaneous Daily  . ipratropium-albuterol  3 mL Nebulization Q4H WA  . levofloxacin  500 mg Oral q1800  . methylPREDNISolone (SOLU-MEDROL) injection  60 mg Intravenous Q6H  . metoprolol  50 mg Oral BID  . pantoprazole  40 mg Oral BID  . simvastatin  40 mg Oral QPM  . sodium chloride  3 mL Intravenous Q12H  . terazosin  1 mg Oral QHS  . ziprasidone  80 mg Oral QPM   Continuous Infusions:   Active Problems:   Obstructive sleep apnea   COPD exacerbation   CAD (coronary artery disease)   DM (diabetes mellitus)   Hyperkalemia   Acute respiratory failure with hypercapnia   COPD with acute exacerbation   Acute on chronic diastolic CHF (congestive heart failure)   Encephalopathy, metabolic    Time spent: 30 mins    [facility] Pager [phone]. If 7PM-7AM, please contact night-coverage at [email or web address], password [facility] 08/07/2014, 9:28 AM  LOS: 3 days

## 2014-08-07 NOTE — Progress Notes (Signed)
Notified Dr Roderic Palau of ABG CO2 reading of 59 which is a decrease from previous readings and of blood draw labs CO2 of 41.

## 2014-08-07 NOTE — Progress Notes (Signed)
Patient report called to [place]. All information pertinent given and no questions asked.

## 2014-08-07 NOTE — Plan of Care (Signed)
Problem: Consults Goal: Pulmonary Consult Outcome: Completed/Met Date Met:  08/07/14 Goal: Skin Care Protocol Initiated - if Braden Score 18 or less If consults are not indicated, leave blank or document N/A  Outcome: Progressing Goal: Diabetes Guidelines if Diabetic/Glucose > 140 If diabetic or lab glucose is > 140 mg/dl - Initiate Diabetes/Hyperglycemia Guidelines & Document Interventions  Outcome: Completed/Met Date Met:  08/07/14 Goal: Respiratory Therapy Consult Outcome: Completed/Met Date Met:  08/07/14 Goal: Dietician Consult Outcome: Completed/Met Date Met:  08/07/14  Problem: ICU Phase Progression Outcomes Goal: O2 sats trending toward baseline Outcome: Progressing Goal: Dyspnea controlled at rest Outcome: Completed/Met Date Met:  08/07/14 Goal: Hemodynamically stable Outcome: Progressing Goal: Pain controlled with appropriate interventions Outcome: Completed/Met Date Met:  08/07/14

## 2014-08-07 NOTE — Progress Notes (Signed)
Subjective: He says he feels better. He is more awake and alert. He did use BiPAP last night.  Objective: Vital signs in last 24 hours: Temp:  [96.7 F (35.9 C)-98.4 F (36.9 C)] 97 F (36.1 C) (11/21 0800) Pulse Rate:  [63-80] 80 (11/21 1001) Resp:  [9-21] 21 (11/21 0900) BP: (102-180)/(51-98) 148/69 mmHg (11/21 1001) SpO2:  [87 %-100 %] 88 % (11/21 0900) FiO2 (%):  [35 %-40 %] 40 % (11/21 0012) Weight:  [133 kg (293 lb 3.4 oz)-133.2 kg (293 lb 10.4 oz)] 133 kg (293 lb 3.4 oz) (11/21 0700) Weight change: -0.5 kg (-1 lb 1.6 oz)    Intake/Output from previous day: 11/20 0701 - 11/21 0700 In: 1693 [P.O.:1440; I.V.:253] Out: 3950 [Urine:3950]  PHYSICAL EXAM General appearance: alert, cooperative and moderate distress Resp: clear to auscultation bilaterally Cardio: regular rate and rhythm, S1, S2 normal, no murmur, click, rub or gallop GI: soft, non-tender; bowel sounds normal; no masses,  no organomegaly Extremities: extremities normal, atraumatic, no cyanosis or edema  Lab Results:  Results for orders placed or performed during the hospital encounter of 08/04/14 (from the past 48 hour(s))  Glucose, capillary     Status: Abnormal   Collection Time: 08/05/14 11:15 AM  Result Value Ref Range   Glucose-Capillary 345 (H) 70 - 99 mg/dL   Comment 1 Notify RN   Glucose, capillary     Status: Abnormal   Collection Time: 08/05/14  4:14 PM  Result Value Ref Range   Glucose-Capillary 351 (H) 70 - 99 mg/dL   Comment 1 Notify RN   Glucose, capillary     Status: Abnormal   Collection Time: 08/05/14  9:11 PM  Result Value Ref Range   Glucose-Capillary 373 (H) 70 - 99 mg/dL   Comment 1 Notify RN   CBC     Status: Abnormal   Collection Time: 08/06/14  4:26 AM  Result Value Ref Range   WBC 11.9 (H) 4.0 - 10.5 K/uL   RBC 3.86 (L) 4.22 - 5.81 MIL/uL   Hemoglobin 10.4 (L) 13.0 - 17.0 g/dL   HCT 36.2 (L) 39.0 - 52.0 %   MCV 93.8 78.0 - 100.0 fL   MCH 26.9 26.0 - 34.0 pg   MCHC 28.7  (L) 30.0 - 36.0 g/dL   RDW 14.0 11.5 - 15.5 %   Platelets 189 150 - 400 K/uL  Basic metabolic panel     Status: Abnormal   Collection Time: 08/06/14  4:26 AM  Result Value Ref Range   Sodium 138 137 - 147 mEq/L   Potassium 5.2 3.7 - 5.3 mEq/L   Chloride 89 (L) 96 - 112 mEq/L   CO2 44 (HH) 19 - 32 mEq/L    Comment: CRITICAL RESULT CALLED TO, READ BACK BY AND VERIFIED WITH: KEITH,A AT 6:35AM ON 08/06/14 BY FESTERMAN,C    Glucose, Bld 239 (H) 70 - 99 mg/dL   BUN 16 6 - 23 mg/dL   Creatinine, Ser 1.01 0.50 - 1.35 mg/dL   Calcium 8.9 8.4 - 10.5 mg/dL   GFR calc non Af Amer 75 (L) >90 mL/min   GFR calc Af Amer 87 (L) >90 mL/min    Comment: (NOTE) The eGFR has been calculated using the CKD EPI equation. This calculation has not been validated in all clinical situations. eGFR's persistently <90 mL/min signify possible Chronic Kidney Disease.    Anion gap 5 5 - 15  Blood gas, arterial     Status: Abnormal   Collection Time:  08/06/14  4:55 AM  Result Value Ref Range   O2 Content 3.0 L/min   Delivery systems NASAL CANNULA    pH, Arterial 7.323 (L) 7.350 - 7.450   pCO2 arterial 87.2 (HH) 35.0 - 45.0 mmHg    Comment: CRITICAL RESULT CALLED TO, READ BACK BY AND VERIFIED WITH: AMANDA KEITH,RN BY K KNICK,RRT RCP ON 08/06/2014 AT 0459    pO2, Arterial 66.7 (L) 80.0 - 100.0 mmHg   Bicarbonate 44.0 (H) 20.0 - 24.0 mEq/L   TCO2 41.2 0 - 100 mmol/L   Acid-Base Excess 17.2 (H) 0.0 - 2.0 mmol/L   O2 Saturation 90.8 %   Patient temperature 37.0    Collection site RIGHT RADIAL    Drawn by [id number]    Sample type ARTERIAL    Allens test (pass/fail) PASS PASS  Glucose, capillary     Status: Abnormal   Collection Time: 08/06/14  5:15 AM  Result Value Ref Range   Glucose-Capillary 240 (H) 70 - 99 mg/dL  Glucose, capillary     Status: Abnormal   Collection Time: 08/06/14  8:14 AM  Result Value Ref Range   Glucose-Capillary 267 (H) 70 - 99 mg/dL   Comment 1 Notify RN    Comment 2 Documented in  Chart   Glucose, capillary     Status: Abnormal   Collection Time: 08/06/14 11:21 AM  Result Value Ref Range   Glucose-Capillary 308 (H) 70 - 99 mg/dL   Comment 1 Notify RN    Comment 2 Documented in Chart   Glucose, capillary     Status: Abnormal   Collection Time: 08/06/14  4:41 PM  Result Value Ref Range   Glucose-Capillary 245 (H) 70 - 99 mg/dL   Comment 1 Notify RN    Comment 2 Documented in Chart   Glucose, capillary     Status: Abnormal   Collection Time: 08/06/14  9:07 PM  Result Value Ref Range   Glucose-Capillary 245 (H) 70 - 99 mg/dL   Comment 1 Notify RN    Comment 2 Documented in Chart   Glucose, capillary     Status: Abnormal   Collection Time: 08/07/14  8:26 AM  Result Value Ref Range   Glucose-Capillary 201 (H) 70 - 99 mg/dL   Comment 1 Documented in Chart    Comment 2 Notify RN   Basic metabolic panel     Status: Abnormal   Collection Time: 08/07/14  8:34 AM  Result Value Ref Range   Sodium 135 (L) 137 - 147 mEq/L   Potassium 4.5 3.7 - 5.3 mEq/L   Chloride 85 (L) 96 - 112 mEq/L   CO2 41 (HH) 19 - 32 mEq/L    Comment: CRITICAL RESULT CALLED TO, READ BACK BY AND VERIFIED WITH: STONE,A AT 9:20AM ON 08/07/14 BY FESTERMAN,C    Glucose, Bld 222 (H) 70 - 99 mg/dL   BUN 21 6 - 23 mg/dL   Creatinine, Ser 0.88 0.50 - 1.35 mg/dL   Calcium 8.8 8.4 - 10.5 mg/dL   GFR calc non Af Amer 87 (L) >90 mL/min   GFR calc Af Amer >90 >90 mL/min    Comment: (NOTE) The eGFR has been calculated using the CKD EPI equation. This calculation has not been validated in all clinical situations. eGFR's persistently <90 mL/min signify possible Chronic Kidney Disease.    Anion gap 9 5 - 15  CBC     Status: Abnormal   Collection Time: 08/07/14  8:34 AM  Result Value Ref  Range   WBC 9.8 4.0 - 10.5 K/uL   RBC 4.24 4.22 - 5.81 MIL/uL   Hemoglobin 11.5 (L) 13.0 - 17.0 g/dL   HCT 38.7 (L) 39.0 - 52.0 %   MCV 91.3 78.0 - 100.0 fL   MCH 27.1 26.0 - 34.0 pg   MCHC 29.7 (L) 30.0 - 36.0  g/dL   RDW 13.8 11.5 - 15.5 %   Platelets 191 150 - 400 K/uL  Blood gas, arterial     Status: Abnormal   Collection Time: 08/07/14  9:21 AM  Result Value Ref Range   O2 Content 3.0 L/min   Delivery systems NASAL CANNULA    pH, Arterial 7.445 7.350 - 7.450   pCO2 arterial 59.0 (HH) 35.0 - 45.0 mmHg    Comment: CRITICAL RESULT CALLED TO, READ BACK BY AND VERIFIED WITH: LAWRENCE HYLTON,RN ON 08/07/2014 BY PEVIANY LAWSON,RRT AT 0930.    pO2, Arterial 61.7 (L) 80.0 - 100.0 mmHg   Bicarbonate 39.9 (H) 20.0 - 24.0 mEq/L   TCO2 36.1 0 - 100 mmol/L   Acid-Base Excess 14.9 (H) 0.0 - 2.0 mmol/L   O2 Saturation 91.6 %   Patient temperature 37.0    Collection site RIGHT RADIAL    Drawn by [phone]    Sample type ARTERIAL    Allens test (pass/fail) PASS PASS    ABGS  Recent Labs  08/07/14 0921  PHART 7.445  PO2ART 61.7*  TCO2 36.1  HCO3 39.9*   CULTURES No results found for this or any previous visit (from the past 240 hour(s)). Studies/Results: No results found.  Medications:  Prior to Admission:  Prescriptions prior to admission  Medication Sig Dispense Refill Last Dose  . albuterol (PROVENTIL) (2.5 MG/3ML) 0.083% nebulizer solution Take 3 mLs (2.5 mg total) by nebulization every 6 (six) hours as needed for wheezing or shortness of breath. 75 mL 0 08/03/2014 at Unknown time  . aspirin 81 MG chewable tablet Chew 81 mg by mouth every morning.    08/04/2014 at Unknown time  . brimonidine (ALPHAGAN) 0.15 % ophthalmic solution Place 1 drop into the right eye 2 (two) times daily.    08/04/2014 at Unknown time  . Cinnamon 500 MG capsule Take 1,000 mg by mouth every morning.    08/04/2014 at Unknown time  . citalopram (CELEXA) 20 MG tablet Take 20 mg by mouth every morning.   08/04/2014 at Unknown time  . clopidogrel (PLAVIX) 75 MG tablet Take 75 mg by mouth every morning.   08/04/2014 at Unknown time  . fentaNYL (DURAGESIC - DOSED MCG/HR) 50 MCG/HR Place 50 mcg onto the skin every 3  (three) days.  0 Past Week at Unknown time  . furosemide (LASIX) 40 MG tablet Take 40 mg by mouth daily.   08/04/2014 at Unknown time  . gabapentin (NEURONTIN) 300 MG capsule Take 300 mg by mouth 3 (three) times daily.   08/04/2014 at Unknown time  . Insulin Human (INSULIN PUMP) SOLN Inject into the skin continuous. Humalog   08/04/2014 at Unknown time  . metoprolol (LOPRESSOR) 50 MG tablet Take 50 mg by mouth 2 (two) times daily.   08/04/2014 at 800  . pantoprazole (PROTONIX) 40 MG tablet Take 40 mg by mouth 2 (two) times daily.   08/04/2014 at Unknown time  . simvastatin (ZOCOR) 40 MG tablet Take 40 mg by mouth every evening.   08/03/2014 at Unknown time  . terazosin (HYTRIN) 1 MG capsule Take 1 mg by mouth at bedtime.  08/03/2014 at Unknown time  . tiotropium (SPIRIVA HANDIHALER) 18 MCG inhalation capsule Place 1 capsule (18 mcg total) into inhaler and inhale every morning. 30 capsule 12 08/04/2014 at Unknown time  . traZODone (DESYREL) 150 MG tablet Take 150 mg by mouth at bedtime.    08/03/2014 at Unknown time  . triamcinolone cream (KENALOG) 0.1 % Apply 1 application topically daily as needed (for irritation).    Past Week at Unknown time  . ziprasidone (GEODON) 80 MG capsule Take 80 mg by mouth every evening.   08/03/2014 at Unknown time  . albuterol (PROVENTIL HFA;VENTOLIN HFA) 108 (90 BASE) MCG/ACT inhaler Inhale 2 puffs into the lungs every 6 (six) hours as needed for wheezing or shortness of breath. 1 Inhaler 2 unknown  . fentaNYL (DURAGESIC - DOSED MCG/HR) 12 MCG/HR Place 1 patch (12.5 mcg total) onto the skin every 3 (three) days. (Patient not taking: Reported on 08/04/2014) 5 patch 0   . LORazepam (ATIVAN) 1 MG tablet Take 0.5 tablets (0.5 mg total) by mouth 2 (two) times daily as needed for anxiety. (Patient not taking: Reported on 08/04/2014) 30 tablet 0   . predniSONE (DELTASONE) 20 MG tablet Take 2 tablets (40 mg total) by mouth daily before breakfast. (Patient not taking: Reported  on 08/04/2014) 6 tablet 0    Scheduled: . antiseptic oral rinse  7 mL Mouth Rinse q12n4p  . aspirin  81 mg Oral q morning - 10a  . brimonidine  1 drop Right Eye BID  . chlorhexidine  15 mL Mouth Rinse BID  . citalopram  20 mg Oral q morning - 10a  . clopidogrel  75 mg Oral q morning - 10a  . enoxaparin (LOVENOX) injection  70 mg Subcutaneous Q24H  . fentaNYL  50 mcg Transdermal Q3 days  . gabapentin  300 mg Oral TID  . guaiFENesin  1,200 mg Oral BID  . insulin aspart  0-20 Units Subcutaneous TID WC  . insulin aspart  0-5 Units Subcutaneous QHS  . insulin aspart  5 Units Subcutaneous TID WC  . insulin glargine  27 Units Subcutaneous Daily  . ipratropium-albuterol  3 mL Nebulization Q4H WA  . levofloxacin  500 mg Oral q1800  . methylPREDNISolone (SOLU-MEDROL) injection  60 mg Intravenous Q12H  . metoprolol  50 mg Oral BID  . pantoprazole  40 mg Oral BID  . simvastatin  40 mg Oral QPM  . sodium chloride  3 mL Intravenous Q12H  . terazosin  1 mg Oral QHS  . ziprasidone  80 mg Oral QPM   Continuous:  MAU:[id number] chloride, albuterol, HYDROcodone-acetaminophen, LORazepam, ondansetron **OR** ondansetron (ZOFRAN) IV, sodium chloride  Assesment: He came in with acute respiratory failure with COPD exacerbation. He has chronic respiratory failure on the basis of his COPD. He is much improved after using BiPAP Active Problems:   Obstructive sleep apnea   COPD exacerbation   CAD (coronary artery disease)   DM (diabetes mellitus)   Hyperkalemia   Acute respiratory failure with hypercapnia   COPD with acute exacerbation   Acute on chronic diastolic CHF (congestive heart failure)   Encephalopathy, metabolic    Plan: Continue BiPAP continue other medications    LOS: 3 days   , L 08/07/2014, 10:31 AM

## 2014-08-08 LAB — BASIC METABOLIC PANEL
ANION GAP: 8 (ref 5–15)
BUN: 21 mg/dL (ref 6–23)
CHLORIDE: 88 meq/L — AB (ref 96–112)
CO2: 39 mEq/L — ABNORMAL HIGH (ref 19–32)
Calcium: 8.6 mg/dL (ref 8.4–10.5)
Creatinine, Ser: 0.78 mg/dL (ref 0.50–1.35)
GFR calc non Af Amer: >90 mL/min (ref >90)
Glucose, Bld: 258 mg/dL — ABNORMAL HIGH (ref 70–99)
POTASSIUM: 4.9 meq/L (ref 3.7–5.3)
Sodium: 135 mEq/L — ABNORMAL LOW (ref 137–147)

## 2014-08-08 LAB — GLUCOSE, CAPILLARY
GLUCOSE-CAPILLARY: 287 mg/dL — AB (ref 70–99)
Glucose-Capillary: 257 mg/dL — ABNORMAL HIGH (ref 70–99)
Glucose-Capillary: 152 mg/dL — ABNORMAL HIGH (ref 70–99)
Glucose-Capillary: 178 mg/dL — ABNORMAL HIGH (ref 70–99)

## 2014-08-08 MED ORDER — FUROSEMIDE 40 MG PO TABS
40.0000 mg | ORAL_TABLET | Freq: Every day | ORAL | Status: DC
  Administered 2014-08-08 – 2014-08-10 (×3): 40 mg via ORAL
  Filled 2014-08-08 (×3): qty 1

## 2014-08-08 NOTE — Progress Notes (Signed)
Subjective: He said he felt bad which may have been from anxiety last night. He is otherwise doing okay. No other new complaints.  Objective: Vital signs in last 24 hours: Temp:  [97.5 F (36.4 C)-98.7 F (37.1 C)] 98.3 F (36.8 C) (11/22 0541) Pulse Rate:  [64-85] 69 (11/22 0541) Resp:  [12-20] 20 (11/22 0541) BP: (141-152)/(66-80) 141/66 mmHg (11/22 0541) SpO2:  [89 %-96 %] 92 % (11/22 0744) FiO2 (%):  [40 %] 40 % (11/21 2343) Weight:  [132 kg (291 lb 0.1 oz)] 132 kg (291 lb 0.1 oz) (11/22 0541) Weight change: -1.2 kg (-2 lb 10.3 oz) Last BM Date: 08/06/14  Intake/Output from previous day: 11/21 0701 - 11/22 0700 In: 480 [P.O.:480] Out: 4325 [Urine:4325]  PHYSICAL EXAM General appearance: alert, cooperative, mild distress and morbidly obese Resp: clear to auscultation bilaterally Cardio: regular rate and rhythm, S1, S2 normal, no murmur, click, rub or gallop GI: normal findings: bowel sounds normal Extremities: extremities normal, atraumatic, no cyanosis or edema  Lab Results:  Results for orders placed or performed during the hospital encounter of 08/04/14 (from the past 48 hour(s))  Glucose, capillary     Status: Abnormal   Collection Time: 08/06/14 11:21 AM  Result Value Ref Range   Glucose-Capillary 308 (H) 70 - 99 mg/dL   Comment 1 Notify RN    Comment 2 Documented in Chart   Glucose, capillary     Status: Abnormal   Collection Time: 08/06/14  4:41 PM  Result Value Ref Range   Glucose-Capillary 245 (H) 70 - 99 mg/dL   Comment 1 Notify RN    Comment 2 Documented in Chart   Glucose, capillary     Status: Abnormal   Collection Time: 08/06/14  9:07 PM  Result Value Ref Range   Glucose-Capillary 245 (H) 70 - 99 mg/dL   Comment 1 Notify RN    Comment 2 Documented in Chart   Glucose, capillary     Status: Abnormal   Collection Time: 08/07/14  8:26 AM  Result Value Ref Range   Glucose-Capillary 201 (H) 70 - 99 mg/dL   Comment 1 Documented in Chart    Comment 2  Notify RN   Basic metabolic panel     Status: Abnormal   Collection Time: 08/07/14  8:34 AM  Result Value Ref Range   Sodium 135 (L) 137 - 147 mEq/L   Potassium 4.5 3.7 - 5.3 mEq/L   Chloride 85 (L) 96 - 112 mEq/L   CO2 41 (HH) 19 - 32 mEq/L    Comment: CRITICAL RESULT CALLED TO, READ BACK BY AND VERIFIED WITH: STONE,A AT 9:20AM ON 08/07/14 BY FESTERMAN,C    Glucose, Bld 222 (H) 70 - 99 mg/dL   BUN 21 6 - 23 mg/dL   Creatinine, Ser 0.88 0.50 - 1.35 mg/dL   Calcium 8.8 8.4 - 10.5 mg/dL   GFR calc non Af Amer 87 (L) >90 mL/min   GFR calc Af Amer >90 >90 mL/min    Comment: (NOTE) The eGFR has been calculated using the CKD EPI equation. This calculation has not been validated in all clinical situations. eGFR's persistently <90 mL/min signify possible Chronic Kidney Disease.    Anion gap 9 5 - 15  CBC     Status: Abnormal   Collection Time: 08/07/14  8:34 AM  Result Value Ref Range   WBC 9.8 4.0 - 10.5 K/uL   RBC 4.24 4.22 - 5.81 MIL/uL   Hemoglobin 11.5 (L) 13.0 - 17.0  g/dL   HCT 38.7 (L) 39.0 - 52.0 %   MCV 91.3 78.0 - 100.0 fL   MCH 27.1 26.0 - 34.0 pg   MCHC 29.7 (L) 30.0 - 36.0 g/dL   RDW 13.8 11.5 - 15.5 %   Platelets 191 150 - 400 K/uL  Blood gas, arterial     Status: Abnormal   Collection Time: 08/07/14  9:21 AM  Result Value Ref Range   O2 Content 3.0 L/min   Delivery systems NASAL CANNULA    pH, Arterial 7.445 7.350 - 7.450   pCO2 arterial 59.0 (HH) 35.0 - 45.0 mmHg    Comment: CRITICAL RESULT CALLED TO, READ BACK BY AND VERIFIED WITH: LAWRENCE HYLTON,RN ON 08/07/2014 BY PEVIANY LAWSON,RRT AT 0930.    pO2, Arterial 61.7 (L) 80.0 - 100.0 mmHg   Bicarbonate 39.9 (H) 20.0 - 24.0 mEq/L   TCO2 36.1 0 - 100 mmol/L   Acid-Base Excess 14.9 (H) 0.0 - 2.0 mmol/L   O2 Saturation 91.6 %   Patient temperature 37.0    Collection site RIGHT RADIAL    Drawn by [phone]    Sample type ARTERIAL    Allens test (pass/fail) PASS PASS  Glucose, capillary     Status: Abnormal    Collection Time: 08/07/14 11:26 AM  Result Value Ref Range   Glucose-Capillary 229 (H) 70 - 99 mg/dL   Comment 1 Documented in Chart    Comment 2 Notify RN   Glucose, capillary     Status: Abnormal   Collection Time: 08/07/14  4:54 PM  Result Value Ref Range   Glucose-Capillary 221 (H) 70 - 99 mg/dL   Comment 1 Notify RN    Comment 2 Documented in Chart   Glucose, capillary     Status: Abnormal   Collection Time: 08/07/14  8:41 PM  Result Value Ref Range   Glucose-Capillary 260 (H) 70 - 99 mg/dL   Comment 1 Notify RN    Comment 2 Documented in Chart   Basic metabolic panel     Status: Abnormal   Collection Time: 08/08/14  6:00 AM  Result Value Ref Range   Sodium 135 (L) 137 - 147 mEq/L   Potassium 4.9 3.7 - 5.3 mEq/L   Chloride 88 (L) 96 - 112 mEq/L   CO2 39 (H) 19 - 32 mEq/L   Glucose, Bld 258 (H) 70 - 99 mg/dL   BUN 21 6 - 23 mg/dL   Creatinine, Ser 0.78 0.50 - 1.35 mg/dL   Calcium 8.6 8.4 - 10.5 mg/dL   GFR calc non Af Amer >90 >90 mL/min   GFR calc Af Amer >90 >90 mL/min    Comment: (NOTE) The eGFR has been calculated using the CKD EPI equation. This calculation has not been validated in all clinical situations. eGFR's persistently <90 mL/min signify possible Chronic Kidney Disease.    Anion gap 8 5 - 15  Glucose, capillary     Status: Abnormal   Collection Time: 08/08/14  7:27 AM  Result Value Ref Range   Glucose-Capillary 257 (H) 70 - 99 mg/dL   Comment 1 Notify RN     ABGS  Recent Labs  08/07/14 0921  PHART 7.445  PO2ART 61.7*  TCO2 36.1  HCO3 39.9*   CULTURES No results found for this or any previous visit (from the past 240 hour(s)). Studies/Results: No results found.  Medications:  Prior to Admission:  Prescriptions prior to admission  Medication Sig Dispense Refill Last Dose  . albuterol (PROVENTIL) (  2.5 MG/3ML) 0.083% nebulizer solution Take 3 mLs (2.5 mg total) by nebulization every 6 (six) hours as needed for wheezing or shortness of  breath. 75 mL 0 08/03/2014 at Unknown time  . aspirin 81 MG chewable tablet Chew 81 mg by mouth every morning.    08/04/2014 at Unknown time  . brimonidine (ALPHAGAN) 0.15 % ophthalmic solution Place 1 drop into the right eye 2 (two) times daily.    08/04/2014 at Unknown time  . Cinnamon 500 MG capsule Take 1,000 mg by mouth every morning.    08/04/2014 at Unknown time  . citalopram (CELEXA) 20 MG tablet Take 20 mg by mouth every morning.   08/04/2014 at Unknown time  . clopidogrel (PLAVIX) 75 MG tablet Take 75 mg by mouth every morning.   08/04/2014 at Unknown time  . fentaNYL (DURAGESIC - DOSED MCG/HR) 50 MCG/HR Place 50 mcg onto the skin every 3 (three) days.  0 Past Week at Unknown time  . furosemide (LASIX) 40 MG tablet Take 40 mg by mouth daily.   08/04/2014 at Unknown time  . gabapentin (NEURONTIN) 300 MG capsule Take 300 mg by mouth 3 (three) times daily.   08/04/2014 at Unknown time  . Insulin Human (INSULIN PUMP) SOLN Inject into the skin continuous. Humalog   08/04/2014 at Unknown time  . metoprolol (LOPRESSOR) 50 MG tablet Take 50 mg by mouth 2 (two) times daily.   08/04/2014 at 800  . pantoprazole (PROTONIX) 40 MG tablet Take 40 mg by mouth 2 (two) times daily.   08/04/2014 at Unknown time  . simvastatin (ZOCOR) 40 MG tablet Take 40 mg by mouth every evening.   08/03/2014 at Unknown time  . terazosin (HYTRIN) 1 MG capsule Take 1 mg by mouth at bedtime.   08/03/2014 at Unknown time  . tiotropium (SPIRIVA HANDIHALER) 18 MCG inhalation capsule Place 1 capsule (18 mcg total) into inhaler and inhale every morning. 30 capsule 12 08/04/2014 at Unknown time  . traZODone (DESYREL) 150 MG tablet Take 150 mg by mouth at bedtime.    08/03/2014 at Unknown time  . triamcinolone cream (KENALOG) 0.1 % Apply 1 application topically daily as needed (for irritation).    Past Week at Unknown time  . ziprasidone (GEODON) 80 MG capsule Take 80 mg by mouth every evening.   08/03/2014 at Unknown time  .  albuterol (PROVENTIL HFA;VENTOLIN HFA) 108 (90 BASE) MCG/ACT inhaler Inhale 2 puffs into the lungs every 6 (six) hours as needed for wheezing or shortness of breath. 1 Inhaler 2 unknown  . fentaNYL (DURAGESIC - DOSED MCG/HR) 12 MCG/HR Place 1 patch (12.5 mcg total) onto the skin every 3 (three) days. (Patient not taking: Reported on 08/04/2014) 5 patch 0   . LORazepam (ATIVAN) 1 MG tablet Take 0.5 tablets (0.5 mg total) by mouth 2 (two) times daily as needed for anxiety. (Patient not taking: Reported on 08/04/2014) 30 tablet 0   . predniSONE (DELTASONE) 20 MG tablet Take 2 tablets (40 mg total) by mouth daily before breakfast. (Patient not taking: Reported on 08/04/2014) 6 tablet 0    Scheduled: . antiseptic oral rinse  7 mL Mouth Rinse q12n4p  . aspirin  81 mg Oral q morning - 10a  . brimonidine  1 drop Right Eye BID  . chlorhexidine  15 mL Mouth Rinse BID  . citalopram  20 mg Oral q morning - 10a  . clopidogrel  75 mg Oral q morning - 10a  . enoxaparin (LOVENOX) injection  70  mg Subcutaneous Q24H  . fentaNYL  50 mcg Transdermal Q3 days  . furosemide  40 mg Oral Daily  . gabapentin  300 mg Oral TID  . guaiFENesin  1,200 mg Oral BID  . insulin aspart  0-20 Units Subcutaneous TID WC  . insulin aspart  0-5 Units Subcutaneous QHS  . insulin aspart  5 Units Subcutaneous TID WC  . insulin glargine  27 Units Subcutaneous Daily  . ipratropium-albuterol  3 mL Nebulization Q4H WA  . levofloxacin  500 mg Oral q1800  . methylPREDNISolone (SOLU-MEDROL) injection  60 mg Intravenous Q12H  . metoprolol  50 mg Oral BID  . pantoprazole  40 mg Oral BID  . simvastatin  40 mg Oral QPM  . sodium chloride  3 mL Intravenous Q12H  . terazosin  1 mg Oral QHS  . ziprasidone  80 mg Oral QPM   Continuous:  YPE:JYLTEI chloride, albuterol, HYDROcodone-acetaminophen, LORazepam, ondansetron **OR** ondansetron (ZOFRAN) IV, sodium chloride  Assesment: He was admitted with acute hypercapnic respiratory failure. He  had acute on chronic diastolic CHF. He has COPD. He admitted encephalopathic and that is better. Active Problems:   Obstructive sleep apnea   COPD exacerbation   CAD (coronary artery disease)   DM (diabetes mellitus)   Hyperkalemia   Acute respiratory failure with hypercapnia   COPD with acute exacerbation   Acute on chronic diastolic CHF (congestive heart failure)   Encephalopathy, metabolic    Plan: Continue current treatments. He does seem to be improving.    LOS: 4 days   Cassie Henkels L 08/08/2014, 9:47 AM

## 2014-08-08 NOTE — Plan of Care (Signed)
Problem: Phase I Progression Outcomes Goal: Tolerating diet Outcome: Completed/Met Date Met:  08/08/14

## 2014-08-08 NOTE — Progress Notes (Signed)
[facility] PROGRESS NOTE  Rick Cruz [id number] DOB: [birth year] DOA: 08/04/2014 PCP: [name], MD  Assessment/Plan: 1. Acute on chronic respiratory failure. Multifactorial related to COPD, OSA/OHS and congestive heart failure. ABG appears to be improving today since patient wore bipap last night. Will continue QHS bipap. Continue chronic oxygen supplementation. 2. Acute on chronic diastolic congestive heart failure. Currently on by mouth Lasix with improved volume status. Continue BB. 3. COPD exacerbation. Wheezing improving. Can consider transitioning steroids to prednisone tomorrow. Continue bronchodilators. Antibiotic course has been completed. Appreciate pulmonology assitance 4. Insulin-dependent diabetes. Patient uses an insulin pump at home. Will use Lantus and NovoLog therapy in the hospital based on recommendations from diabetes coordinator. 5. Obstructive sleep apnea/obesity hypoventilation syndrome. He is not receiving any positive pressure therapy at home. Will likely need a BiPAP as an outpatient. 6. Coronary artery disease. Continue aspirin and Plavix. No chest pain at this time. 7. Hyperkalemia. Improved with kayexalate.  8. Metabolic encephalopathy related to hypercapnea, improved with bipap therapy  Code Status: full code Family Communication: discussed with patient's wife over the phone  Disposition Plan: discharge home once improved.    Consultants:  Pulmonology  Procedures:    Antibiotics:  levaquin 11/18>>08/08/14  HPI/Subjective: Patient is more awake and alert today. Feels that breathing is improving, cough is improving  Objective: Filed Vitals:   08/08/14 0541  BP: 141/66  Pulse: 69  Temp: 98.3 F (36.8 C)  Resp: 20    Intake/Output Summary (Last 24 hours) at 08/08/14 1320 Last data filed at 08/08/14 1043  Gross per 24 hour  Intake    240 ml  Output   4400 ml  Net  -4160 ml   Filed Weights   08/07/14 0600 08/07/14 0700  08/08/14 0541  Weight: 133.2 kg (293 lb 10.4 oz) 133 kg (293 lb 3.4 oz) 132 kg (291 lb 0.1 oz)    Exam:   General:  NAD  Cardiovascular: s1, s2, rrr  Respiratory: improved air movement bilaterally with minimal wheeze  Abdomen: soft, obese, nt, bs+  Musculoskeletal: changes of chronic venous stasis with no edema b/l   Data Reviewed: Basic Metabolic Panel:  Recent Labs Lab 08/04/14 1525 08/05/14 0422 08/06/14 0426 08/07/14 0834 08/08/14 0600  NA 143 139 138 135* 135*  K 5.5* 6.0* 5.2 4.5 4.9  CL 96 92* 89* 85* 88*  CO2 42* 42* 44* 41* 39*  GLUCOSE 91 310* 239* 222* 258*  BUN 5* 9 16 21 21   CREATININE 0.95 0.97 1.01 0.88 0.78  CALCIUM 9.1 9.0 8.9 8.8 8.6   Liver Function Tests:  Recent Labs Lab 08/05/14 0422  AST 8  ALT 8  ALKPHOS 101  BILITOT 0.3  PROT 7.0  ALBUMIN 3.3*   No results for input(s): LIPASE, AMYLASE in the last 168 hours. No results for input(s): AMMONIA in the last 168 hours. CBC:  Recent Labs Lab 08/04/14 1525 08/05/14 0422 08/06/14 0426 08/07/14 0834  WBC 9.4 9.6 11.9* 9.8  HGB 11.2* 11.2* 10.4* 11.5*  HCT 40.3 39.6 36.2* 38.7*  MCV 97.1 95.7 93.8 91.3  PLT 213 196 189 191   Cardiac Enzymes:  Recent Labs Lab 08/04/14 1525  TROPONINI <0.30   BNP (last 3 results)  Recent Labs  07/14/14 1830 07/21/14 0143 08/04/14 1525  PROBNP 211.1* 99.9 277.3*   CBG:  Recent Labs Lab 08/07/14 1126 08/07/14 1654 08/07/14 2041 08/08/14 0727 08/08/14 1134  GLUCAP 229* 221* 260* 257* 287*    No results found for  this or any previous visit (from the past 240 hour(s)).   Studies: No results found.  Scheduled Meds: . antiseptic oral rinse  7 mL Mouth Rinse q12n4p  . aspirin  81 mg Oral q morning - 10a  . brimonidine  1 drop Right Eye BID  . chlorhexidine  15 mL Mouth Rinse BID  . citalopram  20 mg Oral q morning - 10a  . clopidogrel  75 mg Oral q morning - 10a  . enoxaparin (LOVENOX) injection  70 mg Subcutaneous Q24H  .  fentaNYL  50 mcg Transdermal Q3 days  . furosemide  40 mg Oral Daily  . gabapentin  300 mg Oral TID  . guaiFENesin  1,200 mg Oral BID  . insulin aspart  0-20 Units Subcutaneous TID WC  . insulin aspart  0-5 Units Subcutaneous QHS  . insulin aspart  5 Units Subcutaneous TID WC  . insulin glargine  27 Units Subcutaneous Daily  . ipratropium-albuterol  3 mL Nebulization Q4H WA  . levofloxacin  500 mg Oral q1800  . methylPREDNISolone (SOLU-MEDROL) injection  60 mg Intravenous Q12H  . metoprolol  50 mg Oral BID  . pantoprazole  40 mg Oral BID  . simvastatin  40 mg Oral QPM  . sodium chloride  3 mL Intravenous Q12H  . terazosin  1 mg Oral QHS  . ziprasidone  80 mg Oral QPM   Continuous Infusions:   Active Problems:   Obstructive sleep apnea   COPD exacerbation   CAD (coronary artery disease)   DM (diabetes mellitus)   Hyperkalemia   Acute respiratory failure with hypercapnia   COPD with acute exacerbation   Acute on chronic diastolic CHF (congestive heart failure)   Encephalopathy, metabolic    Time spent: 30 mins    [facility] Pager [phone]. If 7PM-7AM, please contact night-coverage at [email or web address], password [facility] 08/08/2014, 1:20 PM  LOS: 4 days

## 2014-08-09 LAB — BASIC METABOLIC PANEL
ANION GAP: 7 (ref 5–15)
BUN: 21 mg/dL (ref 6–23)
CALCIUM: 8.6 mg/dL (ref 8.4–10.5)
CHLORIDE: 88 meq/L — AB (ref 96–112)
CO2: 39 mEq/L — ABNORMAL HIGH (ref 19–32)
Creatinine, Ser: 0.79 mg/dL (ref 0.50–1.35)
GFR calc Af Amer: >90 mL/min (ref >90)
GFR calc non Af Amer: >90 mL/min (ref >90)
Glucose, Bld: 228 mg/dL — ABNORMAL HIGH (ref 70–99)
POTASSIUM: 5 meq/L (ref 3.7–5.3)
SODIUM: 134 meq/L — AB (ref 137–147)

## 2014-08-09 LAB — GLUCOSE, CAPILLARY
Glucose-Capillary: 212 mg/dL — ABNORMAL HIGH (ref 70–99)
Glucose-Capillary: 299 mg/dL — ABNORMAL HIGH (ref 70–99)
Glucose-Capillary: 268 mg/dL — ABNORMAL HIGH (ref 70–99)
Glucose-Capillary: 272 mg/dL — ABNORMAL HIGH (ref 70–99)

## 2014-08-09 MED ORDER — PREDNISONE 20 MG PO TABS
40.0000 mg | ORAL_TABLET | Freq: Every day | ORAL | Status: DC
  Administered 2014-08-10: 40 mg via ORAL
  Filled 2014-08-09: qty 2

## 2014-08-09 MED ORDER — LORAZEPAM 0.5 MG PO TABS
0.5000 mg | ORAL_TABLET | Freq: Three times a day (TID) | ORAL | Status: DC | PRN
  Administered 2014-08-09 – 2014-08-10 (×2): 0.5 mg via ORAL
  Filled 2014-08-09 (×2): qty 1

## 2014-08-09 MED ORDER — IPRATROPIUM-ALBUTEROL 0.5-2.5 (3) MG/3ML IN SOLN
3.0000 mL | Freq: Three times a day (TID) | RESPIRATORY_TRACT | Status: DC
  Administered 2014-08-09 – 2014-08-10 (×2): 3 mL via RESPIRATORY_TRACT
  Filled 2014-08-09 (×2): qty 3

## 2014-08-09 NOTE — Progress Notes (Signed)
Inpatient Diabetes Program Recommendations  AACE/ADA: New Consensus Statement on Inpatient Glycemic Control (2013)  Target Ranges:  Prepandial:   less than 140 mg/dL      Peak postprandial:   less than 180 mg/dL (1-2 hours)      Critically ill patients:  140 - 180 mg/dL   Results for RISHAD, BOULDEN (MRN [id number]) as of 08/09/2014 08:18  Ref. Range 08/08/2014 07:27 08/08/2014 11:34 08/08/2014 16:56 08/08/2014 20:33 08/09/2014 07:40  Glucose-Capillary Latest Range: 70-99 mg/dL 257 (H) 287 (H) 152 (H) 178 (H) 212 (H)  Results for JESSE, MADREN (MRN [id number]) as of 08/09/2014 08:18  Ref. Range 08/07/2014 08:26 08/07/2014 11:26 08/07/2014 16:54 08/07/2014 20:41  Glucose-Capillary Latest Range: 70-99 mg/dL 201 (H) 229 (H) 221 (H) 260 (H)   Diabetes history: DM2 Outpatient Diabetes medications: Medtronic insulin pump with Humalog basal dose 96 units/24 hours Basal4 units/hour (Total basal insulin 96 units per day) Carb ratio1:4 (1 unit for every 4 grams of carbs) Insulin sensitivity1:12 (1 unit drops glucose 12 mg/dl) Target Glucose120 mg/dl  Current orders:Lantus 27 units daily, Novolog 0-20 units AC, Novolog 0-5 units HS, Novolog 5 units TID with meals  Inpatient Diabetes Program Recommendations Insulin - Basal: Please consider increasing Lantus to 35 units daily. Insulin - Meal Coverage: Please consider increasing Novolog meal coverage to 10 units TID with meals.  Thanks, Barnie Alderman, RN, MSN, CCRN, CDE Diabetes Coordinator Inpatient Diabetes Program [phone] (Team Pager) [phone] ([place] office) [phone] [facility] office)

## 2014-08-09 NOTE — Evaluation (Signed)
Physical Therapy Evaluation Patient Details Name: Rick Cruz MRN: [id number] DOB: 07-25-1946 Today's Date: 08/09/2014   History of Present Illness  68 year old male who  has a past medical history of COPD (chronic obstructive pulmonary disease); CHF (congestive heart failure); Hypercholesterolemia; Depression; Schizophrenia; Sleep apnea; Tobacco abuse; On home O2; Chronic pain; and Diabetes mellitus without complication.  He is morbidly obese and was admitted with acute hypercapnic respiratory failure and hyperkalemia.  He is married and is blind in the right eye.  Pt fell in August and fractured both ankles.  He reports that his left ankle is healed but the right ankle is not.  He is directed by the MD to be partial weight bearing on the RLE and wear the CAM walker to stabilize the ankle.   Clinical Impression   Pt is seen for evaluation.  He is on 3 L O2 with O2 sat=95%.  He reports feeling well.  His strength is quite good but mobility is limited by respiratory status.  He was able to stand with a walker and sit in a recliner with no difficulty but O2 sat decreased to 86% with this simple activity.  We were unable to even attempt gait as his CAM walker is not here to stabilize the ankle.  I do not anticipate that he will have any physical difficulty with gait but he may need to increase his supplemental O2 to 4 or 5 L/min in order to maintain sats in the 90s.  He might benefit from having a w/c at home but he declines this.  I have asked him to have his wife bring in the CAM walker.  If he is not discharged, we will have him ambulate tomorrow.     Follow Up Recommendations No PT follow up    Equipment Recommendations  None recommended by PT (pt declines a w/c)    Recommendations for Other Services  none     Precautions / Restrictions Precautions Precautions: Fall Required Braces or Orthoses: Other Brace/Splint Other Brace/Splint: CAM walker RLE Restrictions Weight Bearing  Restrictions: Yes Other Position/Activity Restrictions: Pt is to be PWB RLE due to ankle fracture      Mobility  Bed Mobility Overal bed mobility: Modified Independent                Transfers Overall transfer level: Needs assistance Equipment used: Standard walker   Sit to Stand: Supervision         General transfer comment: instructed in anterior weight shift to facilitate standing...able to turn using the walker to sit in a chair  Ambulation/Gait Ambulation/Gait assistance:  (no gait as pt's CAM walker is at home)              Science writer    Modified Rankin (Stroke Patients Only)       Balance Overall balance assessment: No apparent balance deficits (not formally assessed)                                           Pertinent Vitals/Pain Pain Assessment: No/denies pain    Home Living Family/patient expects to be discharged to:: Private residence Living Arrangements: Spouse/significant other Available Help at Discharge: Family;Available 24 hours/day Type of Home: House Home Access: Ramped entrance     Home Layout: One level Home Equipment: Kasandra Knudsen -  single point;Shower seat;Cane - quad;Walker - standard Additional Comments: pt has a lift chair at home    Prior Function Level of Independence: Needs assistance   Gait / Transfers Assistance Needed: uses a walker, PWB Right  ADL's / Homemaking Assistance Needed: wife assists with a sponge bath        Hand Dominance        Extremity/Trunk Assessment               Lower Extremity Assessment: Overall WFL for tasks assessed      Cervical / Trunk Assessment: Normal  Communication      Cognition Arousal/Alertness: Awake/alert Behavior During Therapy: WFL for tasks assessed/performed Overall Cognitive Status: History of cognitive impairments - at baseline (dx of Schizophrenia)                      General Comments       Exercises        Assessment/Plan    PT Assessment Patient needs continued PT services  PT Diagnosis Difficulty walking   PT Problem List Decreased activity tolerance;Decreased mobility;Obesity  PT Treatment Interventions Gait training;Patient/family education   PT Goals (Current goals can be found in the Care Plan section) Acute Rehab PT Goals Patient Stated Goal: none stated PT Goal Formulation: With patient Time For Goal Achievement: 08/23/14 Potential to Achieve Goals: Good    Frequency Min 2X/week   Barriers to discharge   none    Co-evaluation               End of Session Equipment Utilized During Treatment: Gait belt Activity Tolerance: Patient tolerated treatment well Patient left: in chair;with call bell/phone within reach           Time: 1116-1145 PT Time Calculation (min) (ACUTE ONLY): 29 min   Charges:   PT Evaluation $Initial PT Evaluation Tier I: 1 Procedure     PT G CodesDemetrios Isaacs L 08/09/2014, 12:02 PM

## 2014-08-09 NOTE — Progress Notes (Signed)
Asked to increase the dose of his nerve medication because the patient has episodes of anxiety and only had 2 doses prn which I already gave today.  He would need a dose tonight if he gets anxious.  New orders given and followed.

## 2014-08-09 NOTE — Progress Notes (Signed)
[facility] PROGRESS NOTE  Rick Cruz [id number] DOB: May 25, 1946 DOA: 08/04/2014 PCP: [name], MD  Assessment/Plan: 1. Acute on chronic respiratory failure. Multifactorial related to COPD, OSA/OHS and congestive heart failure. ABG appears to be improving today since patient wore bipap last night. Will continue QHS bipap. Continue chronic oxygen supplementation. 2. Acute on chronic diastolic congestive heart failure. Currently on by mouth Lasix with improved volume status. Continue BB. 3. COPD exacerbation. Wheezing improving. Transition steroids to prednisone taper. Continue bronchodilators. Antibiotic course has been completed. Appreciate pulmonology assitance 4. Insulin-dependent diabetes. Patient uses an insulin pump at home. Will use Lantus and NovoLog therapy in the hospital based on recommendations from diabetes coordinator. 5. Obesity hypoventilation syndrome. He is not receiving any positive pressure therapy at home. Will likely need a BiPAP as an outpatient. 6. Coronary artery disease. Continue aspirin and Plavix. No chest pain at this time. 7. Hyperkalemia. Improved with kayexalate.  8. Metabolic encephalopathy related to hypercapnea, improved with bipap therapy  Code Status: full code Family Communication: discussed with patient's wife over the phone  Disposition Plan: discharge home once improved, likely in am. Currently arrangements are being made for home BiPAP.   Consultants:  Pulmonology  Procedures:    Antibiotics:  levaquin 11/18>>08/08/14  HPI/Subjective: Feeling better today. Became short of breath when transferring to bedside commode. Does not have much of a cough now.  Objective: Filed Vitals:   08/09/14 1505  BP: 165/69  Pulse: 40  Temp: 98.6 F (37 C)  Resp: 18    Intake/Output Summary (Last 24 hours) at 08/09/14 1856 Last data filed at 08/09/14 1700  Gross per 24 hour  Intake    360 ml  Output   2700 ml  Net  -2340 ml    Filed Weights   08/07/14 0700 08/08/14 0541 08/09/14 0534  Weight: 133 kg (293 lb 3.4 oz) 132 kg (291 lb 0.1 oz) 131.4 kg (289 lb 11 oz)    Exam:   General:  NAD  Cardiovascular: s1, s2, rrr  Respiratory: improved air movement bilaterally with minimal wheeze  Abdomen: soft, obese, nt, bs+  Musculoskeletal: changes of chronic venous stasis with no edema b/l   Data Reviewed: Basic Metabolic Panel:  Recent Labs Lab 08/05/14 0422 08/06/14 0426 08/07/14 0834 08/08/14 0600 08/09/14 0618  NA 139 138 135* 135* 134*  K 6.0* 5.2 4.5 4.9 5.0  CL 92* 89* 85* 88* 88*  CO2 42* 44* 41* 39* 39*  GLUCOSE 310* 239* 222* 258* 228*  BUN 9 16 21 21 21   CREATININE 0.97 1.01 0.88 0.78 0.79  CALCIUM 9.0 8.9 8.8 8.6 8.6   Liver Function Tests:  Recent Labs Lab 08/05/14 0422  AST 8  ALT 8  ALKPHOS 101  BILITOT 0.3  PROT 7.0  ALBUMIN 3.3*   No results for input(s): LIPASE, AMYLASE in the last 168 hours. No results for input(s): AMMONIA in the last 168 hours. CBC:  Recent Labs Lab 08/04/14 1525 08/05/14 0422 08/06/14 0426 08/07/14 0834  WBC 9.4 9.6 11.9* 9.8  HGB 11.2* 11.2* 10.4* 11.5*  HCT 40.3 39.6 36.2* 38.7*  MCV 97.1 95.7 93.8 91.3  PLT 213 196 189 191   Cardiac Enzymes:  Recent Labs Lab 08/04/14 1525  TROPONINI <0.30   BNP (last 3 results)  Recent Labs  07/14/14 1830 07/21/14 0143 08/04/14 1525  PROBNP 211.1* 99.9 277.3*   CBG:  Recent Labs Lab 08/08/14 1656 08/08/14 2033 08/09/14 0740 08/09/14 1116 08/09/14 1618  GLUCAP 152*  178* 212* 299* 268*    No results found for this or any previous visit (from the past 240 hour(s)).   Studies: No results found.  Scheduled Meds: . antiseptic oral rinse  7 mL Mouth Rinse q12n4p  . aspirin  81 mg Oral q morning - 10a  . brimonidine  1 drop Right Eye BID  . chlorhexidine  15 mL Mouth Rinse BID  . citalopram  20 mg Oral q morning - 10a  . clopidogrel  75 mg Oral q morning - 10a  . enoxaparin  (LOVENOX) injection  70 mg Subcutaneous Q24H  . fentaNYL  50 mcg Transdermal Q3 days  . furosemide  40 mg Oral Daily  . gabapentin  300 mg Oral TID  . guaiFENesin  1,200 mg Oral BID  . insulin aspart  0-20 Units Subcutaneous TID WC  . insulin aspart  0-5 Units Subcutaneous QHS  . insulin aspart  5 Units Subcutaneous TID WC  . insulin glargine  27 Units Subcutaneous Daily  . ipratropium-albuterol  3 mL Nebulization TID  . methylPREDNISolone (SOLU-MEDROL) injection  60 mg Intravenous Q12H  . metoprolol  50 mg Oral BID  . pantoprazole  40 mg Oral BID  . simvastatin  40 mg Oral QPM  . sodium chloride  3 mL Intravenous Q12H  . terazosin  1 mg Oral QHS  . ziprasidone  80 mg Oral QPM   Continuous Infusions:   Active Problems:   Obstructive sleep apnea   COPD exacerbation   CAD (coronary artery disease)   DM (diabetes mellitus)   Hyperkalemia   Acute respiratory failure with hypercapnia   COPD with acute exacerbation   Acute on chronic diastolic CHF (congestive heart failure)   Encephalopathy, metabolic    Time spent: 30 mins    [facility] Pager [phone]. If 7PM-7AM, please contact night-coverage at [email or web address], password [facility] 08/09/2014, 6:56 PM  LOS: 5 days

## 2014-08-09 NOTE — Progress Notes (Signed)
Subjective: He says he feels better. He has no new complaints. He is hopeful of getting BiPAP at home  Objective: Vital signs in last 24 hours: Temp:  [98 F (36.7 C)-98.7 F (37.1 C)] 98.7 F (37.1 C) (11/23 0534) Pulse Rate:  [61-124] 66 (11/23 0753) Resp:  [20] 20 (11/23 0534) BP: (146-162)/(74-80) 160/74 mmHg (11/23 0534) SpO2:  [91 %-95 %] 94 % (11/23 0650) FiO2 (%):  [35 %] 35 % (11/22 2320) Weight:  [131.4 kg (289 lb 11 oz)] 131.4 kg (289 lb 11 oz) (11/23 0534) Weight change: -0.6 kg (-1 lb 5.2 oz) Last BM Date: 08/08/14  Intake/Output from previous day: 11/22 0701 - 11/23 0700 In: 480 [P.O.:480] Out: 3200 [Urine:3200]  PHYSICAL EXAM General appearance: alert, cooperative and morbidly obese Resp: clear to auscultation bilaterally Cardio: regular rate and rhythm, S1, S2 normal, no murmur, click, rub or gallop GI: soft, non-tender; bowel sounds normal; no masses,  no organomegaly Extremities: extremities normal, atraumatic, no cyanosis or edema  Lab Results:  Results for orders placed or performed during the hospital encounter of 08/04/14 (from the past 48 hour(s))  Blood gas, arterial     Status: Abnormal   Collection Time: 08/07/14  9:21 AM  Result Value Ref Range   O2 Content 3.0 L/min   Delivery systems NASAL CANNULA    pH, Arterial 7.445 7.350 - 7.450   pCO2 arterial 59.0 (HH) 35.0 - 45.0 mmHg    Comment: CRITICAL RESULT CALLED TO, READ BACK BY AND VERIFIED WITH: LAWRENCE HYLTON,RN ON 08/07/2014 BY PEVIANY LAWSON,RRT AT 0930.    pO2, Arterial 61.7 (L) 80.0 - 100.0 mmHg   Bicarbonate 39.9 (H) 20.0 - 24.0 mEq/L   TCO2 36.1 0 - 100 mmol/L   Acid-Base Excess 14.9 (H) 0.0 - 2.0 mmol/L   O2 Saturation 91.6 %   Patient temperature 37.0    Collection site RIGHT RADIAL    Drawn by [phone]    Sample type ARTERIAL    Allens test (pass/fail) PASS PASS  Glucose, capillary     Status: Abnormal   Collection Time: 08/07/14 11:26 AM  Result Value Ref Range   Glucose-Capillary 229 (H) 70 - 99 mg/dL   Comment 1 Documented in Chart    Comment 2 Notify RN   Glucose, capillary     Status: Abnormal   Collection Time: 08/07/14  4:54 PM  Result Value Ref Range   Glucose-Capillary 221 (H) 70 - 99 mg/dL   Comment 1 Notify RN    Comment 2 Documented in Chart   Glucose, capillary     Status: Abnormal   Collection Time: 08/07/14  8:41 PM  Result Value Ref Range   Glucose-Capillary 260 (H) 70 - 99 mg/dL   Comment 1 Notify RN    Comment 2 Documented in Chart   Basic metabolic panel     Status: Abnormal   Collection Time: 08/08/14  6:00 AM  Result Value Ref Range   Sodium 135 (L) 137 - 147 mEq/L   Potassium 4.9 3.7 - 5.3 mEq/L   Chloride 88 (L) 96 - 112 mEq/L   CO2 39 (H) 19 - 32 mEq/L   Glucose, Bld 258 (H) 70 - 99 mg/dL   BUN 21 6 - 23 mg/dL   Creatinine, Ser 0.78 0.50 - 1.35 mg/dL   Calcium 8.6 8.4 - 10.5 mg/dL   GFR calc non Af Amer >90 >90 mL/min   GFR calc Af Amer >90 >90 mL/min    Comment: (NOTE) The  eGFR has been calculated using the CKD EPI equation. This calculation has not been validated in all clinical situations. eGFR's persistently <90 mL/min signify possible Chronic Kidney Disease.    Anion gap 8 5 - 15  Glucose, capillary     Status: Abnormal   Collection Time: 08/08/14  7:27 AM  Result Value Ref Range   Glucose-Capillary 257 (H) 70 - 99 mg/dL   Comment 1 Notify RN   Glucose, capillary     Status: Abnormal   Collection Time: 08/08/14 11:34 AM  Result Value Ref Range   Glucose-Capillary 287 (H) 70 - 99 mg/dL   Comment 1 Notify RN   Glucose, capillary     Status: Abnormal   Collection Time: 08/08/14  4:56 PM  Result Value Ref Range   Glucose-Capillary 152 (H) 70 - 99 mg/dL   Comment 1 Notify RN   Glucose, capillary     Status: Abnormal   Collection Time: 08/08/14  8:33 PM  Result Value Ref Range   Glucose-Capillary 178 (H) 70 - 99 mg/dL   Comment 1 Documented in Chart    Comment 2 Notify RN   Basic metabolic panel      Status: Abnormal   Collection Time: 08/09/14  6:18 AM  Result Value Ref Range   Sodium 134 (L) 137 - 147 mEq/L   Potassium 5.0 3.7 - 5.3 mEq/L   Chloride 88 (L) 96 - 112 mEq/L   CO2 39 (H) 19 - 32 mEq/L   Glucose, Bld 228 (H) 70 - 99 mg/dL   BUN 21 6 - 23 mg/dL   Creatinine, Ser 0.79 0.50 - 1.35 mg/dL   Calcium 8.6 8.4 - 10.5 mg/dL   GFR calc non Af Amer >90 >90 mL/min   GFR calc Af Amer >90 >90 mL/min    Comment: (NOTE) The eGFR has been calculated using the CKD EPI equation. This calculation has not been validated in all clinical situations. eGFR's persistently <90 mL/min signify possible Chronic Kidney Disease.    Anion gap 7 5 - 15  Glucose, capillary     Status: Abnormal   Collection Time: 08/09/14  7:40 AM  Result Value Ref Range   Glucose-Capillary 212 (H) 70 - 99 mg/dL   Comment 1 Notify RN     ABGS  Recent Labs  08/07/14 0921  PHART 7.445  PO2ART 61.7*  TCO2 36.1  HCO3 39.9*   CULTURES No results found for this or any previous visit (from the past 240 hour(s)). Studies/Results: No results found.  Medications:  Prior to Admission:  Prescriptions prior to admission  Medication Sig Dispense Refill Last Dose  . albuterol (PROVENTIL) (2.5 MG/3ML) 0.083% nebulizer solution Take 3 mLs (2.5 mg total) by nebulization every 6 (six) hours as needed for wheezing or shortness of breath. 75 mL 0 08/03/2014 at Unknown time  . aspirin 81 MG chewable tablet Chew 81 mg by mouth every morning.    08/04/2014 at Unknown time  . brimonidine (ALPHAGAN) 0.15 % ophthalmic solution Place 1 drop into the right eye 2 (two) times daily.    08/04/2014 at Unknown time  . Cinnamon 500 MG capsule Take 1,000 mg by mouth every morning.    08/04/2014 at Unknown time  . citalopram (CELEXA) 20 MG tablet Take 20 mg by mouth every morning.   08/04/2014 at Unknown time  . clopidogrel (PLAVIX) 75 MG tablet Take 75 mg by mouth every morning.   08/04/2014 at Unknown time  . fentaNYL (DURAGESIC -  DOSED MCG/HR) 50 MCG/HR Place 50 mcg onto the skin every 3 (three) days.  0 Past Week at Unknown time  . furosemide (LASIX) 40 MG tablet Take 40 mg by mouth daily.   08/04/2014 at Unknown time  . gabapentin (NEURONTIN) 300 MG capsule Take 300 mg by mouth 3 (three) times daily.   08/04/2014 at Unknown time  . Insulin Human (INSULIN PUMP) SOLN Inject into the skin continuous. Humalog   08/04/2014 at Unknown time  . metoprolol (LOPRESSOR) 50 MG tablet Take 50 mg by mouth 2 (two) times daily.   08/04/2014 at 800  . pantoprazole (PROTONIX) 40 MG tablet Take 40 mg by mouth 2 (two) times daily.   08/04/2014 at Unknown time  . simvastatin (ZOCOR) 40 MG tablet Take 40 mg by mouth every evening.   08/03/2014 at Unknown time  . terazosin (HYTRIN) 1 MG capsule Take 1 mg by mouth at bedtime.   08/03/2014 at Unknown time  . tiotropium (SPIRIVA HANDIHALER) 18 MCG inhalation capsule Place 1 capsule (18 mcg total) into inhaler and inhale every morning. 30 capsule 12 08/04/2014 at Unknown time  . traZODone (DESYREL) 150 MG tablet Take 150 mg by mouth at bedtime.    08/03/2014 at Unknown time  . triamcinolone cream (KENALOG) 0.1 % Apply 1 application topically daily as needed (for irritation).    Past Week at Unknown time  . ziprasidone (GEODON) 80 MG capsule Take 80 mg by mouth every evening.   08/03/2014 at Unknown time  . albuterol (PROVENTIL HFA;VENTOLIN HFA) 108 (90 BASE) MCG/ACT inhaler Inhale 2 puffs into the lungs every 6 (six) hours as needed for wheezing or shortness of breath. 1 Inhaler 2 unknown  . fentaNYL (DURAGESIC - DOSED MCG/HR) 12 MCG/HR Place 1 patch (12.5 mcg total) onto the skin every 3 (three) days. (Patient not taking: Reported on 08/04/2014) 5 patch 0   . LORazepam (ATIVAN) 1 MG tablet Take 0.5 tablets (0.5 mg total) by mouth 2 (two) times daily as needed for anxiety. (Patient not taking: Reported on 08/04/2014) 30 tablet 0   . predniSONE (DELTASONE) 20 MG tablet Take 2 tablets (40 mg total) by  mouth daily before breakfast. (Patient not taking: Reported on 08/04/2014) 6 tablet 0    Scheduled: . antiseptic oral rinse  7 mL Mouth Rinse q12n4p  . aspirin  81 mg Oral q morning - 10a  . brimonidine  1 drop Right Eye BID  . chlorhexidine  15 mL Mouth Rinse BID  . citalopram  20 mg Oral q morning - 10a  . clopidogrel  75 mg Oral q morning - 10a  . enoxaparin (LOVENOX) injection  70 mg Subcutaneous Q24H  . fentaNYL  50 mcg Transdermal Q3 days  . furosemide  40 mg Oral Daily  . gabapentin  300 mg Oral TID  . guaiFENesin  1,200 mg Oral BID  . insulin aspart  0-20 Units Subcutaneous TID WC  . insulin aspart  0-5 Units Subcutaneous QHS  . insulin aspart  5 Units Subcutaneous TID WC  . insulin glargine  27 Units Subcutaneous Daily  . ipratropium-albuterol  3 mL Nebulization Q4H WA  . methylPREDNISolone (SOLU-MEDROL) injection  60 mg Intravenous Q12H  . metoprolol  50 mg Oral BID  . pantoprazole  40 mg Oral BID  . simvastatin  40 mg Oral QPM  . sodium chloride  3 mL Intravenous Q12H  . terazosin  1 mg Oral QHS  . ziprasidone  80 mg Oral QPM   Continuous:  HEK:BTCYEL chloride, albuterol, HYDROcodone-acetaminophen, LORazepam, ondansetron **OR** ondansetron (ZOFRAN) IV, sodium chloride  Assesment: He was admitted with COPD exacerbation and acute on chronic respiratory failure. He has improved. He is hopeful of being discharged soon. Active Problems:   Obstructive sleep apnea   COPD exacerbation   CAD (coronary artery disease)   DM (diabetes mellitus)   Hyperkalemia   Acute respiratory failure with hypercapnia   COPD with acute exacerbation   Acute on chronic diastolic CHF (congestive heart failure)   Encephalopathy, metabolic    Plan: Continue treatment.    LOS: 5 days   Sajid Ruppert L 08/09/2014, 9:02 AM

## 2014-08-09 NOTE — Progress Notes (Signed)
Came to do breathing tx, patient requested that he come off and go back on nasal cannula, breathing treatment given and then placed back on 3LNC. RT will continue to monitor.

## 2014-08-10 DIAGNOSIS — J9602 Acute respiratory failure with hypercapnia: Secondary | ICD-10-CM

## 2014-08-10 DIAGNOSIS — J9601 Acute respiratory failure with hypoxia: Secondary | ICD-10-CM | POA: Insufficient documentation

## 2014-08-10 LAB — GLUCOSE, CAPILLARY
GLUCOSE-CAPILLARY: 190 mg/dL — AB (ref 70–99)
GLUCOSE-CAPILLARY: 154 mg/dL — AB (ref 70–99)

## 2014-08-10 MED ORDER — LORAZEPAM 1 MG PO TABS: 0.5000 mg | ORAL_TABLET | Freq: Two times a day (BID) | ORAL | Status: DC | PRN

## 2014-08-10 MED ORDER — FUROSEMIDE 40 MG PO TABS
40.0000 mg | ORAL_TABLET | Freq: Every day | ORAL | Status: DC
Start: 2014-08-10 — End: 2016-06-15

## 2014-08-10 MED ORDER — PREDNISONE 10 MG PO TABS: ORAL_TABLET | ORAL | Status: DC

## 2014-08-10 MED ORDER — GUAIFENESIN ER 600 MG PO TB12: 600.0000 mg | ORAL_TABLET | Freq: Two times a day (BID) | ORAL | Status: DC

## 2014-08-10 NOTE — Plan of Care (Signed)
Problem: ICU Phase Progression Outcomes Goal: Flu/PneumoVaccines if indicated Outcome: Completed/Met Date Met:  08/10/14

## 2014-08-10 NOTE — Plan of Care (Signed)
Problem: Phase I Progression Outcomes Goal: Dyspnea controlled at rest Outcome: Completed/Met Date Met:  08/10/14

## 2014-08-10 NOTE — Progress Notes (Signed)
Inpatient Diabetes Program Recommendations  AACE/ADA: New Consensus Statement on Inpatient Glycemic Control (2013)  Target Ranges:  Prepandial:   less than 140 mg/dL      Peak postprandial:   less than 180 mg/dL (1-2 hours)      Critically ill patients:  140 - 180 mg/dL   Results for REVANTH, TRABUCCO (MRN [id number]) as of 08/10/2014 09:29  Ref. Range 08/09/2014 07:40 08/09/2014 11:16 08/09/2014 16:18 08/09/2014 20:36 08/10/2014 07:34  Glucose-Capillary Latest Range: 70-99 mg/dL 212 (H) 299 (H) 268 (H) 272 (H) 190 (H)   Diabetes history: DM2 Outpatient Diabetes medications: [employer] insulin pump with Humalog basal dose 96 units/24 hours Basal4 units/hour (Total basal insulin 96 units per day) Carb ratio1:4 (1 unit for every 4 grams of carbs) Insulin sensitivity1:12 (1 unit drops glucose 12 mg/dl) Target Glucose120 mg/dl  Current orders:Lantus 27 units daily, Novolog 0-20 units AC, Novolog 0-5 units HS, Novolog 5 units TID with meals  Inpatient Diabetes Program Recommendations Insulin - Basal: Noted Lantus was given late yesterday. Patient received Lantus 27 units at 17:49 (pt had refused earlier when ordered at 10:00 am because he thought he was going back on his insulin pump). Please consider increasing Lantus to 30 units daily if patient is not going back on his insulin pump today. Insulin - Meal Coverage: Please consider increasing meal coverage to Novolog 10 units TID with meals.  Thanks, Barnie Alderman, RN, MSN, CCRN, CDE Diabetes Coordinator Inpatient Diabetes Program [phone] (Team Pager) [phone] ([facility] office) [phone] [facility] office)

## 2014-08-10 NOTE — Plan of Care (Signed)
Problem: Phase I Progression Outcomes Goal: Flu/PneumoVaccines if indicated Outcome: Not Applicable Date Met:  46/65/99

## 2014-08-10 NOTE — Progress Notes (Signed)
Subjective: He says he feels okay and notes to go home. He has no other new complaints. His breathing is better. He hopes to be discharged  Objective: Vital signs in last 24 hours: Temp:  [98.5 F (36.9 C)-98.6 F (37 C)] 98.5 F (36.9 C) (11/24 0452) Pulse Rate:  [40-70] 70 (11/24 0452) Resp:  [13-18] 18 (11/24 0452) BP: (150-165)/(64-72) 150/64 mmHg (11/24 0452) SpO2:  [93 %-95 %] 94 % (11/24 0710) Weight change:  Last BM Date: 08/08/14  Intake/Output from previous day: 11/23 0701 - 11/24 0700 In: 360 [P.O.:360] Out: 3200 [Urine:3200]  PHYSICAL EXAM General appearance: alert, cooperative and no distress Resp: clear to auscultation bilaterally Cardio: regular rate and rhythm, S1, S2 normal, no murmur, click, rub or gallop GI: soft, non-tender; bowel sounds normal; no masses,  no organomegaly Extremities: extremities normal, atraumatic, no cyanosis or edema  Lab Results:  Results for orders placed or performed during the hospital encounter of 08/04/14 (from the past 48 hour(s))  Glucose, capillary     Status: Abnormal   Collection Time: 08/08/14 11:34 AM  Result Value Ref Range   Glucose-Capillary 287 (H) 70 - 99 mg/dL   Comment 1 Notify RN   Glucose, capillary     Status: Abnormal   Collection Time: 08/08/14  4:56 PM  Result Value Ref Range   Glucose-Capillary 152 (H) 70 - 99 mg/dL   Comment 1 Notify RN   Glucose, capillary     Status: Abnormal   Collection Time: 08/08/14  8:33 PM  Result Value Ref Range   Glucose-Capillary 178 (H) 70 - 99 mg/dL   Comment 1 Documented in Chart    Comment 2 Notify RN   Basic metabolic panel     Status: Abnormal   Collection Time: 08/09/14  6:18 AM  Result Value Ref Range   Sodium 134 (L) 137 - 147 mEq/L   Potassium 5.0 3.7 - 5.3 mEq/L   Chloride 88 (L) 96 - 112 mEq/L   CO2 39 (H) 19 - 32 mEq/L   Glucose, Bld 228 (H) 70 - 99 mg/dL   BUN 21 6 - 23 mg/dL   Creatinine, Ser 0.79 0.50 - 1.35 mg/dL   Calcium 8.6 8.4 - 10.5 mg/dL   GFR calc non Af Amer >90 >90 mL/min   GFR calc Af Amer >90 >90 mL/min    Comment: (NOTE) The eGFR has been calculated using the CKD EPI equation. This calculation has not been validated in all clinical situations. eGFR's persistently <90 mL/min signify possible Chronic Kidney Disease.    Anion gap 7 5 - 15  Glucose, capillary     Status: Abnormal   Collection Time: 08/09/14  7:40 AM  Result Value Ref Range   Glucose-Capillary 212 (H) 70 - 99 mg/dL   Comment 1 Notify RN   Glucose, capillary     Status: Abnormal   Collection Time: 08/09/14 11:16 AM  Result Value Ref Range   Glucose-Capillary 299 (H) 70 - 99 mg/dL   Comment 1 Notify RN   Glucose, capillary     Status: Abnormal   Collection Time: 08/09/14  4:18 PM  Result Value Ref Range   Glucose-Capillary 268 (H) 70 - 99 mg/dL   Comment 1 Notify RN   Glucose, capillary     Status: Abnormal   Collection Time: 08/09/14  8:36 PM  Result Value Ref Range   Glucose-Capillary 272 (H) 70 - 99 mg/dL   Comment 1 Notify RN   Glucose, capillary  Status: Abnormal   Collection Time: 08/10/14  7:34 AM  Result Value Ref Range   Glucose-Capillary 190 (H) 70 - 99 mg/dL    ABGS  Recent Labs  08/07/14 0921  PHART 7.445  PO2ART 61.7*  TCO2 36.1  HCO3 39.9*   CULTURES No results found for this or any previous visit (from the past 240 hour(s)). Studies/Results: No results found.  Medications:  Prior to Admission:  Prescriptions prior to admission  Medication Sig Dispense Refill Last Dose  . albuterol (PROVENTIL) (2.5 MG/3ML) 0.083% nebulizer solution Take 3 mLs (2.5 mg total) by nebulization every 6 (six) hours as needed for wheezing or shortness of breath. 75 mL 0 08/03/2014 at Unknown time  . aspirin 81 MG chewable tablet Chew 81 mg by mouth every morning.    08/04/2014 at Unknown time  . brimonidine (ALPHAGAN) 0.15 % ophthalmic solution Place 1 drop into the right eye 2 (two) times daily.    08/04/2014 at Unknown time  .  Cinnamon 500 MG capsule Take 1,000 mg by mouth every morning.    08/04/2014 at Unknown time  . citalopram (CELEXA) 20 MG tablet Take 20 mg by mouth every morning.   08/04/2014 at Unknown time  . clopidogrel (PLAVIX) 75 MG tablet Take 75 mg by mouth every morning.   08/04/2014 at Unknown time  . fentaNYL (DURAGESIC - DOSED MCG/HR) 50 MCG/HR Place 50 mcg onto the skin every 3 (three) days.  0 Past Week at Unknown time  . furosemide (LASIX) 40 MG tablet Take 40 mg by mouth daily.   08/04/2014 at Unknown time  . gabapentin (NEURONTIN) 300 MG capsule Take 300 mg by mouth 3 (three) times daily.   08/04/2014 at Unknown time  . Insulin Human (INSULIN PUMP) SOLN Inject into the skin continuous. Humalog   08/04/2014 at Unknown time  . metoprolol (LOPRESSOR) 50 MG tablet Take 50 mg by mouth 2 (two) times daily.   08/04/2014 at 800  . pantoprazole (PROTONIX) 40 MG tablet Take 40 mg by mouth 2 (two) times daily.   08/04/2014 at Unknown time  . simvastatin (ZOCOR) 40 MG tablet Take 40 mg by mouth every evening.   08/03/2014 at Unknown time  . terazosin (HYTRIN) 1 MG capsule Take 1 mg by mouth at bedtime.   08/03/2014 at Unknown time  . tiotropium (SPIRIVA HANDIHALER) 18 MCG inhalation capsule Place 1 capsule (18 mcg total) into inhaler and inhale every morning. 30 capsule 12 08/04/2014 at Unknown time  . traZODone (DESYREL) 150 MG tablet Take 150 mg by mouth at bedtime.    08/03/2014 at Unknown time  . triamcinolone cream (KENALOG) 0.1 % Apply 1 application topically daily as needed (for irritation).    Past Week at Unknown time  . ziprasidone (GEODON) 80 MG capsule Take 80 mg by mouth every evening.   08/03/2014 at Unknown time  . albuterol (PROVENTIL HFA;VENTOLIN HFA) 108 (90 BASE) MCG/ACT inhaler Inhale 2 puffs into the lungs every 6 (six) hours as needed for wheezing or shortness of breath. 1 Inhaler 2 unknown  . fentaNYL (DURAGESIC - DOSED MCG/HR) 12 MCG/HR Place 1 patch (12.5 mcg total) onto the skin every 3  (three) days. (Patient not taking: Reported on 08/04/2014) 5 patch 0   . LORazepam (ATIVAN) 1 MG tablet Take 0.5 tablets (0.5 mg total) by mouth 2 (two) times daily as needed for anxiety. (Patient not taking: Reported on 08/04/2014) 30 tablet 0   . predniSONE (DELTASONE) 20 MG tablet Take 2 tablets (  40 mg total) by mouth daily before breakfast. (Patient not taking: Reported on 08/04/2014) 6 tablet 0    Scheduled: . antiseptic oral rinse  7 mL Mouth Rinse q12n4p  . aspirin  81 mg Oral q morning - 10a  . brimonidine  1 drop Right Eye BID  . chlorhexidine  15 mL Mouth Rinse BID  . citalopram  20 mg Oral q morning - 10a  . clopidogrel  75 mg Oral q morning - 10a  . enoxaparin (LOVENOX) injection  70 mg Subcutaneous Q24H  . fentaNYL  50 mcg Transdermal Q3 days  . furosemide  40 mg Oral Daily  . gabapentin  300 mg Oral TID  . guaiFENesin  1,200 mg Oral BID  . insulin aspart  0-20 Units Subcutaneous TID WC  . insulin aspart  0-5 Units Subcutaneous QHS  . insulin aspart  5 Units Subcutaneous TID WC  . insulin glargine  27 Units Subcutaneous Daily  . ipratropium-albuterol  3 mL Nebulization TID  . metoprolol  50 mg Oral BID  . pantoprazole  40 mg Oral BID  . predniSONE  40 mg Oral Q breakfast  . simvastatin  40 mg Oral QPM  . sodium chloride  3 mL Intravenous Q12H  . terazosin  1 mg Oral QHS  . ziprasidone  80 mg Oral QPM   Continuous:  OOI:[id number] chloride, albuterol, HYDROcodone-acetaminophen, LORazepam, ondansetron **OR** ondansetron (ZOFRAN) IV, sodium chloride  Assesment: He was admitted with acute on chronic respiratory failure which is multifactorial. He has improved. He is hopeful of being discharged home. Active Problems:   Obstructive sleep apnea   COPD exacerbation   CAD (coronary artery disease)   DM (diabetes mellitus)   Hyperkalemia   Acute respiratory failure with hypercapnia   COPD with acute exacerbation   Acute on chronic diastolic CHF (congestive heart failure)    Encephalopathy, metabolic    Plan: I will sign off. Thanks for allowing me to see him with you    LOS: 6 days   Antonetta Clanton L 08/10/2014, 8:52 AM

## 2014-08-10 NOTE — Progress Notes (Signed)
Rick Cruz discharged home with [facility] per MD order.  Discharge instructions reviewed and discussed with the patient and family at bedside, all questions and concerns answered. Copy of instructions and scripts given to patient.    Medication List    TAKE these medications        albuterol 108 (90 BASE) MCG/ACT inhaler  Commonly known as:  PROVENTIL HFA;VENTOLIN HFA  Inhale 2 puffs into the lungs every 6 (six) hours as needed for wheezing or shortness of breath.     albuterol (2.5 MG/3ML) 0.083% nebulizer solution  Commonly known as:  PROVENTIL  Take 3 mLs (2.5 mg total) by nebulization every 6 (six) hours as needed for wheezing or shortness of breath.     aspirin 81 MG chewable tablet  Chew 81 mg by mouth every morning.     brimonidine 0.15 % ophthalmic solution  Commonly known as:  ALPHAGAN  Place 1 drop into the right eye 2 (two) times daily.     Cinnamon 500 MG capsule  Take 1,000 mg by mouth every morning.     citalopram 20 MG tablet  Commonly known as:  CELEXA  Take 20 mg by mouth every morning.     clopidogrel 75 MG tablet  Commonly known as:  PLAVIX  Take 75 mg by mouth every morning.     fentaNYL 50 MCG/HR  Commonly known as:  DURAGESIC - dosed mcg/hr  Place 50 mcg onto the skin every 3 (three) days.     furosemide 40 MG tablet  Commonly known as:  LASIX  Take 1 tablet (40 mg total) by mouth daily.     gabapentin 300 MG capsule  Commonly known as:  NEURONTIN  Take 300 mg by mouth 3 (three) times daily.     guaiFENesin 600 MG 12 hr tablet  Commonly known as:  MUCINEX  Take 1 tablet (600 mg total) by mouth 2 (two) times daily.     insulin pump Soln  Inject into the skin continuous. Humalog     LORazepam 1 MG tablet  Commonly known as:  ATIVAN  Take 0.5 tablets (0.5 mg total) by mouth 2 (two) times daily as needed for anxiety.     metoprolol 50 MG tablet  Commonly known as:  LOPRESSOR  Take 50 mg by mouth 2 (two) times daily.     pantoprazole 40 MG tablet  Commonly known as:  PROTONIX  Take 40 mg by mouth 2 (two) times daily.     predniSONE 10 MG tablet  Commonly known as:  DELTASONE  Take 40mg  po daily for 2 days then 30mg  po daily for 2 days then 20mg  po daily for 2 days then 10mg  po daily for 2 days then stop     simvastatin 40 MG tablet  Commonly known as:  ZOCOR  Take 40 mg by mouth every evening.     terazosin 1 MG capsule  Commonly known as:  HYTRIN  Take 1 mg by mouth at bedtime.     tiotropium 18 MCG inhalation capsule  Commonly known as:  SPIRIVA HANDIHALER  Place 1 capsule (18 mcg total) into inhaler and inhale every morning.     traZODone 150 MG tablet  Commonly known as:  DESYREL  Take 150 mg by mouth at bedtime.     triamcinolone cream 0.1 %  Commonly known as:  KENALOG  Apply 1 application topically daily as needed (for irritation).     ziprasidone 80 MG capsule  Commonly known as:  GEODON  Take 80 mg by mouth every evening.        Patients skin is clean, dry and intact, no evidence of skin break down. IV site discontinued and catheter remains intact. Site without signs and symptoms of complications. Dressing and pressure applied.  Patient escorted to car by Letta Median, NT in a wheelchair,  no distress noted upon discharge.  Regino Bellow 08/10/2014 1:44 PM

## 2014-08-10 NOTE — Discharge Summary (Signed)
Physician Discharge Summary  Rick Cruz [id number] DOB: April 05, 1946 DOA: 08/04/2014  PCP: [name], MD  Admit date: 08/04/2014 Discharge date: 08/10/2014  Time spent:40 minutes  Recommendations for Outpatient Follow-up:  1. Patient has been set up with home health services 2. Referral will be made to [facility] 3. Arrangements have been made to have CPAP set up at home with outpatient sleep study 4. Follow up with primary care physicians in 1-2 weeks  Discharge Diagnoses:  Active Problems:   Obstructive sleep apnea   COPD exacerbation   CAD (coronary artery disease)   DM (diabetes mellitus)   Hyperkalemia   Acute respiratory failure with hypercapnia   COPD with acute exacerbation   Acute on chronic diastolic CHF (congestive heart failure)   Encephalopathy, metabolic   Acute respiratory failure with hypoxia and hypercarbia   Discharge Condition: stable  Diet recommendation: low salt, low carb  Filed Weights   08/07/14 0700 08/08/14 0541 08/09/14 0534  Weight: 133 kg (293 lb 3.4 oz) 132 kg (291 lb 0.1 oz) 131.4 kg (289 lb 11 oz)    History of present illness:  This patient was admitted to the hospital with progressive shortness of breath. He has a history of congestive heart failure as well as oxygen dependent COPD. The patient required BiPAP therapy on admission. He was admitted to the hospital for further treatments.  Hospital Course:  Patient was admitted to the hospital and continued on BiPAP. He was noted to have a significant elevated PCO2 and had associated metabolic encephalopathy. With BiPAP therapy, his PCO2 improved as did his mental status. He was placed on intravenous steroids, antibiotics and bronchodilators for COPD exacerbation. He was also diuresed with intravenous Lasix for congestive heart failure. He now appears to be approaching euvolemia and Lasix has been changed to by mouth. He's also been changed to prednisone taper. He was followed by pulmonology  during his hospital course. 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 Efforts are being made to set the patient up of positive pressure therapy at home. Currently he will have to go with a CPAP machine until a sleep study can be performed as an outpatient. Sleep study has been scheduled for the patient. At this time, patient feels that he is back to baseline. He is requesting discharge home. He will follow-up with his primary care physician a 1-2 weeks.  Procedures:    Consultations:  Pulmonology  Discharge Exam: Filed Vitals:   08/10/14 0452  BP: 150/64  Pulse: 70  Temp: 98.5 F (36.9 C)  Resp: 18    General: NAD Cardiovascular: S1, S2 RRR Respiratory: diminished breath sounds bilaterally  Discharge Instructions You were cared for by a hospitalist during your hospital stay. If you have any questions about your discharge medications or the care you received while you were in the hospital after you are discharged, you can call the unit and asked to speak with the hospitalist on call if the hospitalist that took care of you is not available. Once you are discharged, your primary care physician will handle any further medical issues. Please note that NO REFILLS for any discharge medications will be authorized once you are discharged, as it is imperative that you return to your primary care physician (or establish a relationship with a primary care physician if you do not have one) for your aftercare needs so that they can reassess your need for medications and monitor your lab values.  Discharge Instructions  Call MD for:  difficulty breathing, headache or visual disturbances    Complete by:  As directed      Call MD for:  temperature >100.4    Complete by:  As directed      Diet - low sodium heart healthy    Complete by:  As directed      Diet Carb Modified    Complete by:  As directed      Increase activity slowly    Complete by:  As directed            Current Discharge Medication List    START taking these medications   Details  guaiFENesin (MUCINEX) 600 MG 12 hr tablet Take 1 tablet (600 mg total) by mouth 2 (two) times daily. Qty: 20 tablet, Refills: 0      CONTINUE these medications which have CHANGED   Details  furosemide (LASIX) 40 MG tablet Take 1 tablet (40 mg total) by mouth daily. Qty: 30 tablet, Refills: 1    LORazepam (ATIVAN) 1 MG tablet Take 0.5 tablets (0.5 mg total) by mouth 2 (two) times daily as needed for anxiety. Qty: 20 tablet, Refills: 0    predniSONE (DELTASONE) 10 MG tablet Take 40mg  po daily for 2 days then 30mg  po daily for 2 days then 20mg  po daily for 2 days then 10mg  po daily for 2 days then stop Qty: 20 tablet, Refills: 0      CONTINUE these medications which have NOT CHANGED   Details  albuterol (PROVENTIL) (2.5 MG/3ML) 0.083% nebulizer solution Take 3 mLs (2.5 mg total) by nebulization every 6 (six) hours as needed for wheezing or shortness of breath. Qty: 75 mL, Refills: 0    aspirin 81 MG chewable tablet Chew 81 mg by mouth every morning.     brimonidine (ALPHAGAN) 0.15 % ophthalmic solution Place 1 drop into the right eye 2 (two) times daily.     Cinnamon 500 MG capsule Take 1,000 mg by mouth every morning.     citalopram (CELEXA) 20 MG tablet Take 20 mg by mouth every morning.    clopidogrel (PLAVIX) 75 MG tablet Take 75 mg by mouth every morning.    fentaNYL (DURAGESIC - DOSED MCG/HR) 50 MCG/HR Place 50 mcg onto the skin every 3 (three) days. Refills: 0    gabapentin (NEURONTIN) 300 MG capsule Take 300 mg by mouth 3 (three) times daily.    Insulin Human (INSULIN PUMP) SOLN Inject into the skin continuous. Humalog    metoprolol (LOPRESSOR) 50 MG tablet Take 50 mg by mouth 2 (two) times daily.    pantoprazole (PROTONIX) 40 MG tablet Take 40 mg by mouth 2 (two) times daily.    simvastatin (ZOCOR) 40 MG tablet Take 40 mg by mouth every evening.    terazosin (HYTRIN) 1 MG capsule  Take 1 mg by mouth at bedtime.    tiotropium (SPIRIVA HANDIHALER) 18 MCG inhalation capsule Place 1 capsule (18 mcg total) into inhaler and inhale every morning. Qty: 30 capsule, Refills: 12    traZODone (DESYREL) 150 MG tablet Take 150 mg by mouth at bedtime.     triamcinolone cream (KENALOG) 0.1 % Apply 1 application topically daily as needed (for irritation).     ziprasidone (GEODON) 80 MG capsule Take 80 mg by mouth every evening.    albuterol (PROVENTIL HFA;VENTOLIN HFA) 108 (90 BASE) MCG/ACT inhaler Inhale 2 puffs into the lungs every 6 (six) hours as needed for wheezing or shortness of breath. Qty: 1  Inhaler, Refills: 2      STOP taking these medications     fentaNYL (DURAGESIC - DOSED MCG/HR) 12 MCG/HR        Allergies  Allergen Reactions  . Phenergan [Promethazine Hcl] Other (See Comments)    Becomes very confused and aggressive  . Haldol [Haloperidol Lactate] Other (See Comments)    Shaking    Follow-up Information    Follow up with [facility].   Contact information:   [address] [place] [address] [phone]       Follow up with [name], MD. Schedule an appointment as soon as possible for a visit in 2 weeks.   Specialty:  Internal Medicine   Contact information:   [address] [place] [address] [phone]        The results of significant diagnostics from this hospitalization (including imaging, microbiology, ancillary and laboratory) are listed below for reference.    Significant Diagnostic Studies: Dg Chest 2 View  08/04/2014   CLINICAL DATA:  68 year old with shortness of breath  EXAM: CHEST  2 VIEW  COMPARISON:  07/21/2014  FINDINGS: The cardiac silhouette is enlarged. The mediastinal contours are unchanged. Atherosclerotic aortic calcifications are present.  There is prominence of the interstitial markings and pulmonary vasculature. Small to moderate bilateral pleural effusions are present,  left greater than right. There is no pneumothorax. There is no focal airspace consolidation.  Mild degenerative changes of the spine are noted.  IMPRESSION: 1. Cardiomegaly with mild interstitial pulmonary edema. 2. Bilateral pleural effusions.   Electronically Signed   By: Rosemarie Ax   On: 08/04/2014 15:47   Dg Chest 2 View  07/14/2014   CLINICAL DATA:  Severe shortness of Breath today. History of COPD, diabetes CHF. Former smoker.  EXAM: CHEST  2 VIEW  COMPARISON:  05/08/2014  FINDINGS: There is hyperinflation of the lungs compatible with COPD. Small bilateral pleural effusions are noted. No confluent airspace opacities. Heart is borderline in size with vascular congestion. No overt edema.  IMPRESSION: COPD.  Borderline cardiomegaly with vascular congestion.  Small effusions.   Electronically Signed   By: Rolm Baptise M.D.   On: 07/14/2014 17:54   Dg Chest Port 1 View  07/21/2014   CLINICAL DATA:  Hypoxia.  Decreased oxygen saturation.  EXAM: PORTABLE CHEST - 1 VIEW  COMPARISON:  07/14/2014  FINDINGS: Cardiac enlargement with mild pulmonary vascular congestion. Changes demonstrate mild progression since previous study. Probable small pleural effusions. No focal consolidation in the lungs. No pneumothorax.  IMPRESSION: Cardiac enlargement with increasing pulmonary vascular congestion. Small effusions.   Electronically Signed   By: Lucienne Capers M.D.   On: 07/21/2014 01:09    Microbiology: No results found for this or any previous visit (from the past 240 hour(s)).   Labs: Basic Metabolic Panel:  Recent Labs Lab 08/05/14 0422 08/06/14 0426 08/07/14 0834 08/08/14 0600 08/09/14 0618  NA 139 138 135* 135* 134*  K 6.0* 5.2 4.5 4.9 5.0  CL 92* 89* 85* 88* 88*  CO2 42* 44* 41* 39* 39*  GLUCOSE 310* 239* 222* 258* 228*  BUN 9 16 21 21 21   CREATININE 0.97 1.01 0.88 0.78 0.79  CALCIUM 9.0 8.9 8.8 8.6 8.6   Liver Function Tests:  Recent Labs Lab 08/05/14 0422  AST 8  ALT 8   ALKPHOS 101  BILITOT 0.3  PROT 7.0  ALBUMIN 3.3*   No results for input(s): LIPASE, AMYLASE in the last 168 hours.  No results for input(s): AMMONIA in the last 168 hours. CBC:  Recent Labs Lab 08/04/14 1525 08/05/14 0422 08/06/14 0426 08/07/14 0834  WBC 9.4 9.6 11.9* 9.8  HGB 11.2* 11.2* 10.4* 11.5*  HCT 40.3 39.6 36.2* 38.7*  MCV 97.1 95.7 93.8 91.3  PLT 213 196 189 191   Cardiac Enzymes:  Recent Labs Lab 08/04/14 1525  TROPONINI <0.30   BNP: BNP (last 3 results)  Recent Labs  07/14/14 1830 07/21/14 0143 08/04/14 1525  PROBNP 211.1* 99.9 277.3*   CBG:  Recent Labs Lab 08/09/14 1116 08/09/14 1618 08/09/14 2036 08/10/14 0734 08/10/14 1116  GLUCAP 299* 268* 272* 190* 154*       Signed:  MEMON,JEHANZEB  [facility] 08/10/2014, 12:20 PM

## 2014-08-10 NOTE — Progress Notes (Signed)
Patient has played with BiPAP most of night on and off, he will wear then take it off.

## 2014-08-16 ENCOUNTER — Ambulatory Visit: Payer: Medicare Other | Attending: Internal Medicine | Admitting: Sleep Medicine

## 2014-08-16 VITALS — Ht 72.0 in | Wt 293.0 lb

## 2014-08-16 DIAGNOSIS — Z79899 Other long term (current) drug therapy: Secondary | ICD-10-CM | POA: Diagnosis not present

## 2014-08-16 DIAGNOSIS — R5383 Other fatigue: Secondary | ICD-10-CM | POA: Diagnosis present

## 2014-08-16 DIAGNOSIS — G4730 Sleep apnea, unspecified: Secondary | ICD-10-CM | POA: Diagnosis present

## 2014-08-16 DIAGNOSIS — R0683 Snoring: Secondary | ICD-10-CM | POA: Insufficient documentation

## 2014-08-16 DIAGNOSIS — Z7982 Long term (current) use of aspirin: Secondary | ICD-10-CM | POA: Diagnosis not present

## 2014-08-16 DIAGNOSIS — Z7901 Long term (current) use of anticoagulants: Secondary | ICD-10-CM | POA: Diagnosis not present

## 2014-08-16 DIAGNOSIS — G4733 Obstructive sleep apnea (adult) (pediatric): Secondary | ICD-10-CM

## 2014-08-16 DIAGNOSIS — G4710 Hypersomnia, unspecified: Secondary | ICD-10-CM

## 2014-08-16 DIAGNOSIS — Z7951 Long term (current) use of inhaled steroids: Secondary | ICD-10-CM | POA: Diagnosis not present

## 2014-08-21 NOTE — Sleep Study (Signed)
[place] A. Merlene Laughter, MD     [email or web address]        NOCTURNAL POLYSOMNOGRAM    LOCATION: SLEEP LAB FACILITY: [facility]   PHYSICIAN: Kamsiyochukwu Buist A. Merlene Laughter, M.D.   DATE OF STUDY: 08/16/2014.   REFERRING PHYSICIAN: J. Memon.   INDICATIONS: The patient is a 68 year old man who presents with fatigue, difficulty sleeping, snoring, witnessed apnea and daytime sleepiness.  MEDICATIONS:  Prior to Admission medications   Medication Sig Start Date End Date Taking? Authorizing Provider  albuterol (PROVENTIL HFA;VENTOLIN HFA) 108 (90 BASE) MCG/ACT inhaler Inhale 2 puffs into the lungs every 6 (six) hours as needed for wheezing or shortness of breath. 05/11/14   Radene Gunning, NP  albuterol (PROVENTIL) (2.5 MG/3ML) 0.083% nebulizer solution Take 3 mLs (2.5 mg total) by nebulization every 6 (six) hours as needed for wheezing or shortness of breath. 05/11/14   Radene Gunning, NP  aspirin 81 MG chewable tablet Chew 81 mg by mouth every morning.     Historical Provider, MD  brimonidine (ALPHAGAN) 0.15 % ophthalmic solution Place 1 drop into the right eye 2 (two) times daily.     Historical Provider, MD  Cinnamon 500 MG capsule Take 1,000 mg by mouth every morning.     Historical Provider, MD  citalopram (CELEXA) 20 MG tablet Take 20 mg by mouth every morning.    Historical Provider, MD  clopidogrel (PLAVIX) 75 MG tablet Take 75 mg by mouth every morning.    Historical Provider, MD  fentaNYL (DURAGESIC - DOSED MCG/HR) 50 MCG/HR Place 50 mcg onto the skin every 3 (three) days. 07/20/14   Historical Provider, MD  furosemide (LASIX) 40 MG tablet Take 1 tablet (40 mg total) by mouth daily. 08/10/14   Kathie Dike, MD  gabapentin (NEURONTIN) 300 MG capsule Take 300 mg by mouth 3 (three) times daily.    Historical Provider, MD  guaiFENesin (MUCINEX) 600 MG 12 hr tablet Take 1 tablet (600 mg total) by mouth 2 (two) times daily. 08/10/14   Kathie Dike, MD  Insulin Human (INSULIN PUMP)  SOLN Inject into the skin continuous. Humalog    Historical Provider, MD  LORazepam (ATIVAN) 1 MG tablet Take 0.5 tablets (0.5 mg total) by mouth 2 (two) times daily as needed for anxiety. 08/10/14   Kathie Dike, MD  metoprolol (LOPRESSOR) 50 MG tablet Take 50 mg by mouth 2 (two) times daily.    Historical Provider, MD  pantoprazole (PROTONIX) 40 MG tablet Take 40 mg by mouth 2 (two) times daily.    Historical Provider, MD  predniSONE (DELTASONE) 10 MG tablet Take 40mg  po daily for 2 days then 30mg  po daily for 2 days then 20mg  po daily for 2 days then 10mg  po daily for 2 days then stop 08/10/14   Kathie Dike, MD  simvastatin (ZOCOR) 40 MG tablet Take 40 mg by mouth every evening.    Historical Provider, MD  terazosin (HYTRIN) 1 MG capsule Take 1 mg by mouth at bedtime.    Historical Provider, MD  tiotropium (SPIRIVA HANDIHALER) 18 MCG inhalation capsule Place 1 capsule (18 mcg total) into inhaler and inhale every morning. 05/11/14   Radene Gunning, NP  traZODone (DESYREL) 150 MG tablet Take 150 mg by mouth at bedtime.     Historical Provider, MD  triamcinolone cream (KENALOG) 0.1 % Apply 1 application topically daily as needed (for irritation).     Historical Provider, MD  ziprasidone (GEODON) 80 MG capsule Take 80 mg by mouth  every evening.    Historical Provider, MD      EPWORTH SLEEPINESS SCALE: 17.   BMI: 40.   ARCHITECTURAL SUMMARY: Total recording time was 404 minutes. Sleep efficiency 41 %. Sleep latency 14 minutes. REM latency 307 minutes. Stage NI 19 %, N2 40 % and N3 14 % and REM sleep 27 %.    RESPIRATORY DATA:  Baseline oxygen saturation is 98 %. The lowest saturation is 73 %. The patient had multiple and prolonged nonobstructed desaturations while awake and also while sleeping. The patient was started on oxygen 2 L at the onset of the study and gradually titrated to 4 L. The diagnostic AHI is 106. The patient was placed on positive pressure starting at 5 and increased to 13.  The optimal pressure is 13 with resolution of events and good tolerance. The patient required supplemental oxygen even on the optimal pressure.  LIMB MOVEMENT SUMMARY: PLM index 0.   ELECTROCARDIOGRAM SUMMARY: Average heart rate is 69 with no significant dysrhythmias observed.   IMPRESSION:  1. Severe obstructive sleep apnea syndrome which responds well to a CPAP of 13. 2. Hypoventilation syndrome requiring supplemental oxygen 4 L.  Thanks for this referral.  Galen Russman A. Merlene Laughter, M.D. Diplomat, Tax adviser of Sleep Medicine.

## 2014-09-15 ENCOUNTER — Institutional Professional Consult (permissible substitution): Payer: Medicare Other | Admitting: Pulmonary Disease

## 2014-09-23 DIAGNOSIS — E1165 Type 2 diabetes mellitus with hyperglycemia: Secondary | ICD-10-CM | POA: Diagnosis not present

## 2014-09-23 DIAGNOSIS — G4733 Obstructive sleep apnea (adult) (pediatric): Secondary | ICD-10-CM | POA: Diagnosis not present

## 2014-09-23 DIAGNOSIS — J449 Chronic obstructive pulmonary disease, unspecified: Secondary | ICD-10-CM | POA: Diagnosis not present

## 2014-09-28 DIAGNOSIS — J441 Chronic obstructive pulmonary disease with (acute) exacerbation: Secondary | ICD-10-CM | POA: Diagnosis not present

## 2014-09-28 DIAGNOSIS — G9341 Metabolic encephalopathy: Secondary | ICD-10-CM | POA: Diagnosis not present

## 2014-09-28 DIAGNOSIS — I5033 Acute on chronic diastolic (congestive) heart failure: Secondary | ICD-10-CM | POA: Diagnosis not present

## 2014-09-28 DIAGNOSIS — I2510 Atherosclerotic heart disease of native coronary artery without angina pectoris: Secondary | ICD-10-CM | POA: Diagnosis not present

## 2014-09-28 DIAGNOSIS — Z87891 Personal history of nicotine dependence: Secondary | ICD-10-CM | POA: Diagnosis not present

## 2014-09-28 DIAGNOSIS — E119 Type 2 diabetes mellitus without complications: Secondary | ICD-10-CM | POA: Diagnosis not present

## 2014-10-01 DIAGNOSIS — B351 Tinea unguium: Secondary | ICD-10-CM | POA: Diagnosis not present

## 2014-10-01 DIAGNOSIS — E1140 Type 2 diabetes mellitus with diabetic neuropathy, unspecified: Secondary | ICD-10-CM | POA: Diagnosis not present

## 2014-10-11 DIAGNOSIS — I2510 Atherosclerotic heart disease of native coronary artery without angina pectoris: Secondary | ICD-10-CM | POA: Diagnosis not present

## 2014-10-11 DIAGNOSIS — G9341 Metabolic encephalopathy: Secondary | ICD-10-CM | POA: Diagnosis not present

## 2014-10-11 DIAGNOSIS — I5033 Acute on chronic diastolic (congestive) heart failure: Secondary | ICD-10-CM | POA: Diagnosis not present

## 2014-10-11 DIAGNOSIS — E119 Type 2 diabetes mellitus without complications: Secondary | ICD-10-CM | POA: Diagnosis not present

## 2014-10-11 DIAGNOSIS — Z87891 Personal history of nicotine dependence: Secondary | ICD-10-CM | POA: Diagnosis not present

## 2014-10-11 DIAGNOSIS — J441 Chronic obstructive pulmonary disease with (acute) exacerbation: Secondary | ICD-10-CM | POA: Diagnosis not present

## 2014-10-23 DIAGNOSIS — H1033 Unspecified acute conjunctivitis, bilateral: Secondary | ICD-10-CM | POA: Diagnosis not present

## 2014-10-24 DIAGNOSIS — G4733 Obstructive sleep apnea (adult) (pediatric): Secondary | ICD-10-CM | POA: Diagnosis not present

## 2014-10-24 DIAGNOSIS — J449 Chronic obstructive pulmonary disease, unspecified: Secondary | ICD-10-CM | POA: Diagnosis not present

## 2014-10-26 DIAGNOSIS — E119 Type 2 diabetes mellitus without complications: Secondary | ICD-10-CM | POA: Diagnosis not present

## 2014-10-28 DIAGNOSIS — E119 Type 2 diabetes mellitus without complications: Secondary | ICD-10-CM | POA: Diagnosis not present

## 2014-11-03 DIAGNOSIS — I10 Essential (primary) hypertension: Secondary | ICD-10-CM | POA: Diagnosis not present

## 2014-11-03 DIAGNOSIS — R300 Dysuria: Secondary | ICD-10-CM | POA: Diagnosis not present

## 2014-11-08 DIAGNOSIS — G4733 Obstructive sleep apnea (adult) (pediatric): Secondary | ICD-10-CM | POA: Diagnosis not present

## 2014-11-08 DIAGNOSIS — F5105 Insomnia due to other mental disorder: Secondary | ICD-10-CM | POA: Diagnosis not present

## 2014-11-08 DIAGNOSIS — R5383 Other fatigue: Secondary | ICD-10-CM | POA: Diagnosis not present

## 2014-12-05 ENCOUNTER — Inpatient Hospital Stay (HOSPITAL_COMMUNITY)
Admission: EM | Admit: 2014-12-05 | Discharge: 2014-12-07 | DRG: 918 | Disposition: A | Discharge status: Skilled Nursing Facility | Payer: Medicare Other | Attending: Internal Medicine | Admitting: Internal Medicine

## 2014-12-05 ENCOUNTER — Emergency Department (HOSPITAL_COMMUNITY): Payer: Medicare Other

## 2014-12-05 ENCOUNTER — Encounter (HOSPITAL_COMMUNITY): Payer: Self-pay | Admitting: *Deleted

## 2014-12-05 DIAGNOSIS — Z87891 Personal history of nicotine dependence: Secondary | ICD-10-CM | POA: Diagnosis not present

## 2014-12-05 DIAGNOSIS — W07XXXA Fall from chair, initial encounter: Secondary | ICD-10-CM | POA: Diagnosis present

## 2014-12-05 DIAGNOSIS — F209 Schizophrenia, unspecified: Secondary | ICD-10-CM | POA: Diagnosis present

## 2014-12-05 DIAGNOSIS — I10 Essential (primary) hypertension: Secondary | ICD-10-CM | POA: Diagnosis present

## 2014-12-05 DIAGNOSIS — Z7902 Long term (current) use of antithrombotics/antiplatelets: Secondary | ICD-10-CM | POA: Diagnosis not present

## 2014-12-05 DIAGNOSIS — Z820 Family history of epilepsy and other diseases of the nervous system: Secondary | ICD-10-CM | POA: Diagnosis not present

## 2014-12-05 DIAGNOSIS — E119 Type 2 diabetes mellitus without complications: Secondary | ICD-10-CM

## 2014-12-05 DIAGNOSIS — Z794 Long term (current) use of insulin: Secondary | ICD-10-CM

## 2014-12-05 DIAGNOSIS — Z9181 History of falling: Secondary | ICD-10-CM

## 2014-12-05 DIAGNOSIS — W19XXXA Unspecified fall, initial encounter: Secondary | ICD-10-CM

## 2014-12-05 DIAGNOSIS — J449 Chronic obstructive pulmonary disease, unspecified: Secondary | ICD-10-CM | POA: Diagnosis present

## 2014-12-05 DIAGNOSIS — S82891A Other fracture of right lower leg, initial encounter for closed fracture: Secondary | ICD-10-CM | POA: Diagnosis present

## 2014-12-05 DIAGNOSIS — Z7982 Long term (current) use of aspirin: Secondary | ICD-10-CM | POA: Diagnosis not present

## 2014-12-05 DIAGNOSIS — Z9981 Dependence on supplemental oxygen: Secondary | ICD-10-CM | POA: Diagnosis not present

## 2014-12-05 DIAGNOSIS — E780 Pure hypercholesterolemia: Secondary | ICD-10-CM | POA: Diagnosis present

## 2014-12-05 DIAGNOSIS — S8251XA Displaced fracture of medial malleolus of right tibia, initial encounter for closed fracture: Secondary | ICD-10-CM | POA: Diagnosis present

## 2014-12-05 DIAGNOSIS — R739 Hyperglycemia, unspecified: Secondary | ICD-10-CM

## 2014-12-05 DIAGNOSIS — T40601A Poisoning by unspecified narcotics, accidental (unintentional), initial encounter: Secondary | ICD-10-CM | POA: Diagnosis present

## 2014-12-05 DIAGNOSIS — E118 Type 2 diabetes mellitus with unspecified complications: Secondary | ICD-10-CM

## 2014-12-05 DIAGNOSIS — G8929 Other chronic pain: Secondary | ICD-10-CM | POA: Diagnosis present

## 2014-12-05 DIAGNOSIS — Z9641 Presence of insulin pump (external) (internal): Secondary | ICD-10-CM | POA: Diagnosis present

## 2014-12-05 DIAGNOSIS — S82401A Unspecified fracture of shaft of right fibula, initial encounter for closed fracture: Secondary | ICD-10-CM

## 2014-12-05 DIAGNOSIS — Z9119 Patient's noncompliance with other medical treatment and regimen: Secondary | ICD-10-CM | POA: Diagnosis present

## 2014-12-05 DIAGNOSIS — R296 Repeated falls: Secondary | ICD-10-CM

## 2014-12-05 DIAGNOSIS — Y92099 Unspecified place in other non-institutional residence as the place of occurrence of the external cause: Secondary | ICD-10-CM | POA: Diagnosis not present

## 2014-12-05 DIAGNOSIS — Z6841 Body Mass Index (BMI) 40.0 and over, adult: Secondary | ICD-10-CM | POA: Diagnosis not present

## 2014-12-05 DIAGNOSIS — Z79891 Long term (current) use of opiate analgesic: Secondary | ICD-10-CM

## 2014-12-05 DIAGNOSIS — S82491A Other fracture of shaft of right fibula, initial encounter for closed fracture: Secondary | ICD-10-CM | POA: Diagnosis present

## 2014-12-05 DIAGNOSIS — E1165 Type 2 diabetes mellitus with hyperglycemia: Secondary | ICD-10-CM | POA: Diagnosis present

## 2014-12-05 DIAGNOSIS — I509 Heart failure, unspecified: Secondary | ICD-10-CM | POA: Diagnosis present

## 2014-12-05 DIAGNOSIS — T424X1A Poisoning by benzodiazepines, accidental (unintentional), initial encounter: Secondary | ICD-10-CM | POA: Diagnosis not present

## 2014-12-05 DIAGNOSIS — G4733 Obstructive sleep apnea (adult) (pediatric): Secondary | ICD-10-CM | POA: Diagnosis not present

## 2014-12-05 LAB — BLOOD GAS, ARTERIAL
ACID-BASE EXCESS: 10.7 mmol/L — AB (ref 0.0–2.0)
BICARBONATE: 35.6 meq/L — AB (ref 20.0–24.0)
Drawn by: 105551
O2 Content: 2 L/min
O2 Saturation: 93.9 %
PCO2 ART: 55.4 mmHg — AB (ref 35.0–45.0)
PH ART: 7.424 (ref 7.350–7.450)
PO2 ART: 67.5 mmHg — AB (ref 80.0–100.0)
Patient temperature: 37
TCO2: 32.4 mmol/L (ref 0–100)

## 2014-12-05 LAB — URINALYSIS, ROUTINE W REFLEX MICROSCOPIC
Bilirubin Urine: NEGATIVE
GLUCOSE, UA: 500 mg/dL — AB
Hgb urine dipstick: NEGATIVE
Ketones, ur: NEGATIVE mg/dL
Leukocytes, UA: NEGATIVE
Nitrite: NEGATIVE
PROTEIN: NEGATIVE mg/dL
Specific Gravity, Urine: <1.005 — ABNORMAL LOW (ref 1.005–1.030)
Urobilinogen, UA: 1 mg/dL (ref 0.0–1.0)
pH: 6.5 (ref 5.0–8.0)

## 2014-12-05 LAB — CBC WITH DIFFERENTIAL/PLATELET
Basophils Absolute: 0 10*3/uL (ref 0.0–0.1)
Basophils Relative: 0 % (ref 0–1)
EOS PCT: 0 % (ref 0–5)
Eosinophils Absolute: 0 10*3/uL (ref 0.0–0.7)
HCT: 39.4 % (ref 39.0–52.0)
HEMOGLOBIN: 11.5 g/dL — AB (ref 13.0–17.0)
Lymphocytes Relative: 6 % — ABNORMAL LOW (ref 12–46)
Lymphs Abs: 0.8 10*3/uL (ref 0.7–4.0)
MCH: 26.6 pg (ref 26.0–34.0)
MCHC: 29.2 g/dL — ABNORMAL LOW (ref 30.0–36.0)
MCV: 91.2 fL (ref 78.0–100.0)
MONO ABS: 0.7 10*3/uL (ref 0.1–1.0)
MONOS PCT: 6 % (ref 3–12)
NEUTROS PCT: 88 % — AB (ref 43–77)
Neutro Abs: 11.5 10*3/uL — ABNORMAL HIGH (ref 1.7–7.7)
Platelets: 174 10*3/uL (ref 150–400)
RBC: 4.32 MIL/uL (ref 4.22–5.81)
RDW: 13.7 % (ref 11.5–15.5)
WBC: 13.1 10*3/uL — ABNORMAL HIGH (ref 4.0–10.5)

## 2014-12-05 LAB — I-STAT CG4 LACTIC ACID, ED: Lactic Acid, Venous: 2.83 mmol/L (ref 0.5–2.0)

## 2014-12-05 LAB — BASIC METABOLIC PANEL
Anion gap: 9 (ref 5–15)
BUN: 15 mg/dL (ref 6–23)
CALCIUM: 8.8 mg/dL (ref 8.4–10.5)
CHLORIDE: 93 mmol/L — AB (ref 96–112)
CO2: 33 mmol/L — ABNORMAL HIGH (ref 19–32)
CREATININE: 1.31 mg/dL (ref 0.50–1.35)
GFR calc Af Amer: 63 mL/min — ABNORMAL LOW (ref >90)
GFR calc non Af Amer: 54 mL/min — ABNORMAL LOW (ref >90)
GLUCOSE: 311 mg/dL — AB (ref 70–99)
Potassium: 5.3 mmol/L — ABNORMAL HIGH (ref 3.5–5.1)
Sodium: 135 mmol/L (ref 135–145)

## 2014-12-05 LAB — TROPONIN I: TROPONIN I: 0.03 ng/mL (ref <0.031)

## 2014-12-05 LAB — CK: Total CK: 535 U/L — ABNORMAL HIGH (ref 7–232)

## 2014-12-05 MED ORDER — BRIMONIDINE TARTRATE 0.15 % OP SOLN
1.0000 [drp] | Freq: Two times a day (BID) | OPHTHALMIC | Status: DC
  Administered 2014-12-06 – 2014-12-07 (×3): 1 [drp] via OPHTHALMIC
  Filled 2014-12-05: qty 5

## 2014-12-05 MED ORDER — SODIUM CHLORIDE 0.9 % IV SOLN
INTRAVENOUS | Status: DC
  Administered 2014-12-05: 19:00:00 via INTRAVENOUS
  Administered 2014-12-06 – 2014-12-07 (×2): 75 mL/h via INTRAVENOUS

## 2014-12-05 MED ORDER — SODIUM CHLORIDE 0.9 % IV BOLUS (SEPSIS)
500.0000 mL | Freq: Once | INTRAVENOUS | Status: AC
  Administered 2014-12-05: 500 mL via INTRAVENOUS

## 2014-12-05 MED ORDER — ASPIRIN 81 MG PO CHEW
81.0000 mg | CHEWABLE_TABLET | Freq: Every morning | ORAL | Status: DC
  Administered 2014-12-06 – 2014-12-07 (×2): 81 mg via ORAL
  Filled 2014-12-05 (×2): qty 1

## 2014-12-05 MED ORDER — ALBUTEROL SULFATE HFA 108 (90 BASE) MCG/ACT IN AERS
2.0000 | INHALATION_SPRAY | Freq: Four times a day (QID) | RESPIRATORY_TRACT | Status: DC | PRN
  Filled 2014-12-05: qty 6.7

## 2014-12-05 MED ORDER — HYDROCODONE-ACETAMINOPHEN 5-325 MG PO TABS
1.0000 | ORAL_TABLET | Freq: Four times a day (QID) | ORAL | Status: DC | PRN
Start: 2014-12-05 — End: 2014-12-07
  Administered 2014-12-05 – 2014-12-07 (×5): 1 via ORAL
  Filled 2014-12-05 (×5): qty 1

## 2014-12-05 MED ORDER — HEPARIN SODIUM (PORCINE) 5000 UNIT/ML IJ SOLN
5000.0000 [IU] | Freq: Three times a day (TID) | INTRAMUSCULAR | Status: DC
  Administered 2014-12-06 – 2014-12-07 (×4): 5000 [IU] via SUBCUTANEOUS
  Filled 2014-12-05 (×3): qty 1

## 2014-12-05 MED ORDER — ALBUTEROL SULFATE (2.5 MG/3ML) 0.083% IN NEBU: 2.5000 mg | INHALATION_SOLUTION | Freq: Four times a day (QID) | RESPIRATORY_TRACT | Status: DC | PRN

## 2014-12-05 MED ORDER — IBUPROFEN 400 MG PO TABS
200.0000 mg | ORAL_TABLET | ORAL | Status: DC | PRN
  Administered 2014-12-06: 200 mg via ORAL
  Filled 2014-12-05: qty 1

## 2014-12-05 MED ORDER — GUAIFENESIN ER 600 MG PO TB12
600.0000 mg | ORAL_TABLET | Freq: Two times a day (BID) | ORAL | Status: DC
  Administered 2014-12-05 – 2014-12-07 (×4): 600 mg via ORAL
  Filled 2014-12-05 (×4): qty 1

## 2014-12-05 MED ORDER — SIMVASTATIN 20 MG PO TABS
40.0000 mg | ORAL_TABLET | Freq: Every evening | ORAL | Status: DC
  Administered 2014-12-05 – 2014-12-06 (×2): 40 mg via ORAL
  Filled 2014-12-05 (×2): qty 2

## 2014-12-05 MED ORDER — ZIPRASIDONE HCL 80 MG PO CAPS
ORAL_CAPSULE | ORAL | Status: AC
  Filled 2014-12-05: qty 1

## 2014-12-05 MED ORDER — TRIAMCINOLONE ACETONIDE 0.1 % EX CREA
1.0000 | TOPICAL_CREAM | Freq: Two times a day (BID) | CUTANEOUS | Status: DC | PRN
Start: 2014-12-05 — End: 2014-12-07
  Filled 2014-12-05: qty 15

## 2014-12-05 MED ORDER — LORAZEPAM 0.5 MG PO TABS
0.5000 mg | ORAL_TABLET | Freq: Two times a day (BID) | ORAL | Status: DC | PRN
  Administered 2014-12-06 – 2014-12-07 (×2): 0.5 mg via ORAL
  Filled 2014-12-05 (×2): qty 1

## 2014-12-05 MED ORDER — ONDANSETRON HCL 4 MG/2ML IJ SOLN: 4.0000 mg | Freq: Four times a day (QID) | INTRAMUSCULAR | Status: DC | PRN

## 2014-12-05 MED ORDER — GABAPENTIN 300 MG PO CAPS
300.0000 mg | ORAL_CAPSULE | Freq: Three times a day (TID) | ORAL | Status: DC
  Administered 2014-12-05 – 2014-12-07 (×5): 300 mg via ORAL
  Filled 2014-12-05 (×5): qty 1

## 2014-12-05 MED ORDER — ONDANSETRON HCL 4 MG PO TABS: 4.0000 mg | ORAL_TABLET | Freq: Four times a day (QID) | ORAL | Status: DC | PRN

## 2014-12-05 MED ORDER — CLOPIDOGREL BISULFATE 75 MG PO TABS
75.0000 mg | ORAL_TABLET | Freq: Every morning | ORAL | Status: DC
  Administered 2014-12-06 – 2014-12-07 (×2): 75 mg via ORAL
  Filled 2014-12-05 (×2): qty 1

## 2014-12-05 MED ORDER — TRAZODONE HCL 50 MG PO TABS
150.0000 mg | ORAL_TABLET | Freq: Every day | ORAL | Status: DC
  Administered 2014-12-05 – 2014-12-06 (×2): 150 mg via ORAL
  Filled 2014-12-05 (×2): qty 3

## 2014-12-05 MED ORDER — TERAZOSIN HCL 1 MG PO CAPS
1.0000 mg | ORAL_CAPSULE | Freq: Every day | ORAL | Status: DC
  Administered 2014-12-05 – 2014-12-06 (×2): 1 mg via ORAL
  Filled 2014-12-05 (×2): qty 1

## 2014-12-05 MED ORDER — ZIPRASIDONE HCL 80 MG PO CAPS
80.0000 mg | ORAL_CAPSULE | Freq: Every evening | ORAL | Status: DC
  Administered 2014-12-05 – 2014-12-06 (×2): 80 mg via ORAL
  Filled 2014-12-05 (×3): qty 1

## 2014-12-05 MED ORDER — FUROSEMIDE 40 MG PO TABS: 40.0000 mg | ORAL_TABLET | Freq: Every day | ORAL | Status: DC

## 2014-12-05 MED ORDER — FUROSEMIDE 40 MG PO TABS
40.0000 mg | ORAL_TABLET | Freq: Every day | ORAL | Status: DC
  Administered 2014-12-06 – 2014-12-07 (×2): 40 mg via ORAL
  Filled 2014-12-05 (×2): qty 1

## 2014-12-05 MED ORDER — PANTOPRAZOLE SODIUM 40 MG PO TBEC
40.0000 mg | DELAYED_RELEASE_TABLET | Freq: Two times a day (BID) | ORAL | Status: DC
  Administered 2014-12-05 – 2014-12-07 (×4): 40 mg via ORAL
  Filled 2014-12-05 (×4): qty 1

## 2014-12-05 MED ORDER — METOPROLOL TARTRATE 50 MG PO TABS
50.0000 mg | ORAL_TABLET | Freq: Two times a day (BID) | ORAL | Status: DC
  Administered 2014-12-05 – 2014-12-07 (×4): 50 mg via ORAL
  Filled 2014-12-05 (×4): qty 1

## 2014-12-05 MED ORDER — CITALOPRAM HYDROBROMIDE 20 MG PO TABS
20.0000 mg | ORAL_TABLET | Freq: Every morning | ORAL | Status: DC
  Administered 2014-12-06 – 2014-12-07 (×2): 20 mg via ORAL
  Filled 2014-12-05 (×2): qty 1

## 2014-12-05 MED ORDER — TIOTROPIUM BROMIDE MONOHYDRATE 18 MCG IN CAPS
18.0000 ug | ORAL_CAPSULE | Freq: Every morning | RESPIRATORY_TRACT | Status: DC
  Administered 2014-12-06 – 2014-12-07 (×2): 18 ug via RESPIRATORY_TRACT
  Filled 2014-12-05: qty 5

## 2014-12-05 NOTE — ED Notes (Signed)
Per EMS pt has been having increased falls in the past 4 days with at least 4 falls in the last 24 hours with the last fall causing him to stay in the floor for 5 hours. Pt walks with a walker at home. Pt is having neck and back pain.  Pt states patient didn't hit his head but is sore from the fall.   Per EMS, CBG is 378.     cbg 378

## 2014-12-05 NOTE — Progress Notes (Signed)
abg results given to Dr. Anastasio Champion.  No orders received at this time.

## 2014-12-05 NOTE — ED Provider Notes (Signed)
CSN: [id number]     Arrival date & time 12/05/14  1244 History  This chart was scribed for Rick Greek, MD by Stephania Fragmin, ED Scribe. This patient was seen in room [facility] and the patient's care was started at 1:00 PM.    Chief Complaint  Patient presents with  . Fall   The history is provided by the patient. No language interpreter was used.  ' LEVEL 5 CAVEAT DUE TO PATIENT BEING A POOR HISTORIAN  HPI Comments: AMUN SCHEIBER is a 69 y.o. male brought in by ambulance, who presents to the Emergency Department S/P A fall. He complains of right leg pain from his knee down. He denies vomiting, diarrhea, or any other pain.   Nursing note from today: "Per EMS pt has been having increased falls in the past 4 days with at least 4 falls in the last 24 hours with the last fall causing him to stay in the floor for 5 hours. Pt walks with a walker at home. Pt is having neck and back pain. Pt states patient didn't hit his head but is sore from the fall.   Per EMS, CBG is 378.   cbg 378"   Past Medical History  Diagnosis Date  . COPD (chronic obstructive pulmonary disease)   . CHF (congestive heart failure)     diastolic   . Hypercholesterolemia   . Depression   . Schizophrenia   . Sleep apnea     noncompliant with BiPAP  . Tobacco abuse   . On home O2     3 liters  . Chronic pain   . Diabetes mellitus without complication     insulin pump   Past Surgical History  Procedure Laterality Date  . Knee surgery      X2  . 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   . Colonoscopy  July 2011    Dr. Benson Norway: multiple polyps, internal and external hemorrhoids, diverticulosis. tubular adenoma  . Esophagogastroduodenoscopy (egd) with propofol N/A 04/20/2013    Procedure: ESOPHAGOGASTRODUODENOSCOPY (EGD) WITH PROPOFOL;  Surgeon: Daneil Dolin, MD;  Location: [facility];  Service: Endoscopy;  Laterality: N/A;    Family History  Problem Relation Age of Onset  . Colon cancer Neg Hx   . Parkinson's disease    . Thyroid disease Mother   . Parkinson's disease Father    History  Substance Use Topics  . Smoking status: Former Smoker -- 1.00 packs/day for 50 years    Types: Cigarettes    Quit date: 09/16/2013  . Smokeless tobacco: Never Used  . Alcohol Use: No     Comment: history of ETOH abuse in remote past, none in at least 10 years    Review of Systems  Unable to perform ROS: Other      Allergies  Phenergan and Haldol  Home Medications   Prior to Admission medications   Medication Sig Start Date End Date Taking? Authorizing Provider  albuterol (PROVENTIL HFA;VENTOLIN HFA) 108 (90 BASE) MCG/ACT inhaler Inhale 2 puffs into the lungs every 6 (six) hours as needed for wheezing or shortness of breath. 05/11/14   Radene Gunning, NP  albuterol (PROVENTIL) (2.5 MG/3ML) 0.083% nebulizer solution Take 3 mLs (2.5 mg total) by nebulization every 6 (six) hours as needed for wheezing or shortness of breath. 05/11/14   Radene Gunning, NP  aspirin 81 MG chewable tablet Chew 81  mg by mouth every morning.     Historical Provider, MD  brimonidine (ALPHAGAN) 0.15 % ophthalmic solution Place 1 drop into the right eye 2 (two) times daily.     Historical Provider, MD  Cinnamon 500 MG capsule Take 1,000 mg by mouth every morning.     Historical Provider, MD  citalopram (CELEXA) 20 MG tablet Take 20 mg by mouth every morning.    Historical Provider, MD  clopidogrel (PLAVIX) 75 MG tablet Take 75 mg by mouth every morning.    Historical Provider, MD  fentaNYL (DURAGESIC - DOSED MCG/HR) 50 MCG/HR Place 50 mcg onto the skin every 3 (three) days. 07/20/14   Historical Provider, MD  furosemide (LASIX) 40 MG tablet Take 1 tablet (40 mg total) by mouth daily. 08/10/14   Kathie Dike, MD  gabapentin (NEURONTIN) 300 MG capsule Take 300 mg by mouth 3 (three) times daily.    Historical Provider, MD  guaiFENesin (MUCINEX)  600 MG 12 hr tablet Take 1 tablet (600 mg total) by mouth 2 (two) times daily. 08/10/14   Kathie Dike, MD  Insulin Human (INSULIN PUMP) SOLN Inject into the skin continuous. Humalog    Historical Provider, MD  LORazepam (ATIVAN) 1 MG tablet Take 0.5 tablets (0.5 mg total) by mouth 2 (two) times daily as needed for anxiety. 08/10/14   Kathie Dike, MD  metoprolol (LOPRESSOR) 50 MG tablet Take 50 mg by mouth 2 (two) times daily.    Historical Provider, MD  pantoprazole (PROTONIX) 40 MG tablet Take 40 mg by mouth 2 (two) times daily.    Historical Provider, MD  predniSONE (DELTASONE) 10 MG tablet Take 40mg  po daily for 2 days then 30mg  po daily for 2 days then 20mg  po daily for 2 days then 10mg  po daily for 2 days then stop 08/10/14   Kathie Dike, MD  simvastatin (ZOCOR) 40 MG tablet Take 40 mg by mouth every evening.    Historical Provider, MD  terazosin (HYTRIN) 1 MG capsule Take 1 mg by mouth at bedtime.    Historical Provider, MD  tiotropium (SPIRIVA HANDIHALER) 18 MCG inhalation capsule Place 1 capsule (18 mcg total) into inhaler and inhale every morning. 05/11/14   Radene Gunning, NP  traZODone (DESYREL) 150 MG tablet Take 150 mg by mouth at bedtime.     Historical Provider, MD  triamcinolone cream (KENALOG) 0.1 % Apply 1 application topically daily as needed (for irritation).     Historical Provider, MD  ziprasidone (GEODON) 80 MG capsule Take 80 mg by mouth every evening.    Historical Provider, MD   There were no vitals taken for this visit. Physical Exam  Constitutional: He is oriented to person, place, and time. He appears well-developed and well-nourished. No distress.  HENT:  Head: Normocephalic and atraumatic.  Right Ear: Hearing normal.  Left Ear: Hearing normal.  Nose: Nose normal.  Mouth/Throat: Oropharynx is clear and moist and mucous membranes are normal.  Eyes: Conjunctivae and EOM are normal. Pupils are equal, round, and reactive to light.  Neck: Normal range of motion.  Neck supple.  Cardiovascular: Regular rhythm, S1 normal and S2 normal.  Exam reveals no gallop and no friction rub.   No murmur heard. Pulmonary/Chest: Effort normal and breath sounds normal. No respiratory distress. He exhibits no tenderness.  Abdominal: Soft. Normal appearance and bowel sounds are normal. There is no hepatosplenomegaly. There is no tenderness. There is no rebound, no guarding, no tenderness at McBurney's point and negative Murphy's sign. No  hernia.  Musculoskeletal: Normal range of motion.       Left lower leg: He exhibits tenderness.       Legs: Neurological: He is oriented to person, place, and time. He has normal strength. No cranial nerve deficit or sensory deficit. Coordination normal. GCS eye subscore is 4. GCS verbal subscore is 4. GCS motor subscore is 6.  Sleepy, but wakens to voice and is oriented  Skin: Skin is warm, dry and intact. No rash noted. No cyanosis.  Psychiatric: His speech is delayed.  Nursing note and vitals reviewed.   ED Course  Procedures (including critical care time)    Labs Review Labs Reviewed  CBC WITH DIFFERENTIAL/PLATELET - Abnormal; Notable for the following:    WBC 13.1 (*)    Hemoglobin 11.5 (*)    MCHC 29.2 (*)    Neutrophils Relative % 88 (*)    Neutro Abs 11.5 (*)    Lymphocytes Relative 6 (*)    All other components within normal limits  BASIC METABOLIC PANEL - Abnormal; Notable for the following:    Potassium 5.3 (*)    Chloride 93 (*)    CO2 33 (*)    Glucose, Bld 311 (*)    GFR calc non Af Amer 54 (*)    GFR calc Af Amer 63 (*)    All other components within normal limits  URINALYSIS, ROUTINE W REFLEX MICROSCOPIC - Abnormal; Notable for the following:    Specific Gravity, Urine <1.005 (*)    Glucose, UA 500 (*)    All other components within normal limits  CK - Abnormal; Notable for the following:    Total CK 535 (*)    All other components within normal limits  I-STAT CG4 LACTIC ACID, ED - Abnormal; Notable  for the following:    Lactic Acid, Venous 2.83 (*)    All other components within normal limits  TROPONIN I    Imaging Review Dg Chest 1 View  12/05/2014   CLINICAL DATA:  Status post fall  EXAM: CHEST  1 VIEW  COMPARISON:  08/04/2014  FINDINGS: Lungs are essentially clear. No frank interstitial edema. No pleural effusion or pneumothorax.  Mild cardiomegaly.  IMPRESSION: No evidence of acute cardiopulmonary disease.   Electronically Signed   By: Julian Hy M.D.   On: 12/05/2014 15:20   Dg Thoracic Spine 2 View  12/05/2014   CLINICAL DATA:  Status post fall  EXAM: THORACIC SPINE - 2 VIEW  COMPARISON:  None.  FINDINGS: Normal thoracic kyphosis.  No evidence of fracture or dislocation. Vertebral body heights and intervertebral disc spaces are maintained.  Mild degenerative changes of the mid thoracic spine.  Visualized lungs are clear.  IMPRESSION: No fracture or dislocation is seen.   Electronically Signed   By: Julian Hy M.D.   On: 12/05/2014 15:21   Dg Lumbar Spine Complete  12/05/2014   CLINICAL DATA:  Recent falls, all  EXAM: LUMBAR SPINE - COMPLETE 4+ VIEW  COMPARISON:  MR lumbar spine 06/18/2013  FINDINGS: Normal alignment of lumbar vertebral bodies. No loss vertebral body height and disc height. Minimal endplate spurring. No pars fracture.  IMPRESSION: No acute findings lumbar spine.  No change comparison MRI.   Electronically Signed   By: Suzy Bouchard M.D.   On: 12/05/2014 15:22   Dg Pelvis 1-2 Views  12/05/2014   CLINICAL DATA:  Status post fall, right lower leg pain  EXAM: PELVIS - 1-2 VIEW  COMPARISON:  None.  FINDINGS:  No fracture or dislocation is seen.  Bilateral hip joint spaces are preserved.  Visualized bony pelvis appears intact.  IMPRESSION: No fracture or dislocation is seen.   Electronically Signed   By: Julian Hy M.D.   On: 12/05/2014 15:20   Dg Tibia/fibula Right  12/05/2014   CLINICAL DATA:  Multiple falls with lower leg pain, initial encounter   EXAM: RIGHT TIBIA AND FIBULA - 2 VIEW  COMPARISON:  05/08/2014  FINDINGS: There is an undisplaced fracture in the proximal fibular diaphysis identified. Additionally, the previously seen fracture is again identified with callus formation. The proximal fibular fracture shows no significant callus formation is likely acute in nature. Additionally there is a lucency along the medial malleolus consistent with acute fracture.  IMPRESSION: New medial malleolar fracture and proximal fibular fracture with healing old distal fibular fracture.   Electronically Signed   By: Inez Catalina M.D.   On: 12/05/2014 15:22   Ct Head Wo Contrast  12/05/2014   CLINICAL DATA:  Fall, pt states he fell out of his recliner today. Ems reports patient has had multiple falls over last 4 days. Pt states he did not hilt his head but is sore all over./  EXAM: CT HEAD WITHOUT CONTRAST  CT CERVICAL SPINE WITHOUT CONTRAST  TECHNIQUE: Multidetector CT imaging of the head and cervical spine was performed following the standard protocol without intravenous contrast. Multiplanar CT image reconstructions of the cervical spine were also generated.  COMPARISON:  Head CT, 05/08/2014  FINDINGS: CT HEAD FINDINGS  Ventricles are normal in configuration. There is ventricular and sulcal enlargement reflecting mild atrophy. No parenchymal masses or mass effect. There is no evidence of a cortical infarct. Patchy white matter hypoattenuation is noted consistent mild to moderate chronic microvascular ischemic change.  No extra-axial masses or abnormal fluid collections.  No intracranial hemorrhage.  Visualized sinuses essentially clear. Clear mastoid air cells. No skull fracture.  CT CERVICAL SPINE FINDINGS  No fracture or spondylolisthesis. Reversal of the normal cervical lordosis. There is loss of disc height with endplate spurring most evident at C4-C5, C5-C6 and C6-C7. Facet degenerative changes noted bilaterally in the upper cervical spine. There is neural  foraminal narrowing at multiple levels, greatest on the left at C3-C4, moderate-to-severe from facet and uncovertebral spurring, and on the right from uncovertebral facet spurring at C4-C5, severe in degree.  No significant soft tissue abnormality.  Lung apices are clear.  IMPRESSION: HEAD CT: No acute intracranial abnormalities. Mild atrophy and mild to moderate chronic microvascular ischemic change.  CERVICAL CT:  No fracture or acute finding.   Electronically Signed   By: Lajean Manes M.D.   On: 12/05/2014 14:36   Ct Cervical Spine Wo Contrast  12/05/2014   CLINICAL DATA:  Fall, pt states he fell out of his recliner today. Ems reports patient has had multiple falls over last 4 days. Pt states he did not hilt his head but is sore all over./  EXAM: CT HEAD WITHOUT CONTRAST  CT CERVICAL SPINE WITHOUT CONTRAST  TECHNIQUE: Multidetector CT imaging of the head and cervical spine was performed following the standard protocol without intravenous contrast. Multiplanar CT image reconstructions of the cervical spine were also generated.  COMPARISON:  Head CT, 05/08/2014  FINDINGS: CT HEAD FINDINGS  Ventricles are normal in configuration. There is ventricular and sulcal enlargement reflecting mild atrophy. No parenchymal masses or mass effect. There is no evidence of a cortical infarct. Patchy white matter hypoattenuation is noted consistent mild to moderate chronic  microvascular ischemic change.  No extra-axial masses or abnormal fluid collections.  No intracranial hemorrhage.  Visualized sinuses essentially clear. Clear mastoid air cells. No skull fracture.  CT CERVICAL SPINE FINDINGS  No fracture or spondylolisthesis. Reversal of the normal cervical lordosis. There is loss of disc height with endplate spurring most evident at C4-C5, C5-C6 and C6-C7. Facet degenerative changes noted bilaterally in the upper cervical spine. There is neural foraminal narrowing at multiple levels, greatest on the left at C3-C4,  moderate-to-severe from facet and uncovertebral spurring, and on the right from uncovertebral facet spurring at C4-C5, severe in degree.  No significant soft tissue abnormality.  Lung apices are clear.  IMPRESSION: HEAD CT: No acute intracranial abnormalities. Mild atrophy and mild to moderate chronic microvascular ischemic change.  CERVICAL CT:  No fracture or acute finding.   Electronically Signed   By: Lajean Manes M.D.   On: 12/05/2014 14:36     EKG Interpretation   Date/Time:  Sunday December 05 2014 12:46:00 EDT Ventricular Rate:  96 PR Interval:  204 QRS Duration: 117 QT Interval:  368 QTC Calculation: 465 R Axis:   55 Text Interpretation:  Sinus rhythm Incomplete right bundle branch block  Borderline ST elevation, lateral leads Baseline wander in lead(s) V2 No  significant change since last tracing Confirmed by Rubert Frediani  MD,  Corlis Angelica [phone]) on 12/05/2014 1:10:42 PM      MDM   Final diagnoses:  None   generalized weakness, frequent falls  Hyperglycemia  Fibula fracture   She presents to the ER for evaluation of fall. Patient has had multiple falls over the last several days. He reportedly has fallen up to 4 times over the last 4 days. At arrival to the ER, patient is somnolent, but arousable. He complains of pain in the right ankle area. X-ray of this area was performed. There is a medial malleolus fracture which is the distal most portion, more of an avulsion type fracture. This is a stable fracture. He also has an old fracture of the distal fibula with callus formation. There is a more proximal fracture of the fibula that does not have callus formation, possibly acute.  Patient is experiencing significant weakness here in the ER. He is unable to sit up or support his own weight. With him having multiple falls in the last several days, he cannot be discharged. Fracture of the fibula is nondisplaced and does not require any intervention at this time, will require orthopedic  evaluation tomorrow or even after discharge. He will be placed in a splint. Remainder of his workup was unremarkable other than mild hyperglycemia. Will ask hospitalist to evaluate the patient for admission.  I personally performed the services described in this documentation, which was scribed in my presence. The recorded information has been reviewed and is accurate.      Rick Greek, MD 12/05/14 1755

## 2014-12-05 NOTE — ED Notes (Signed)
Nurse and 2 assists needed to try and ambulate patient with walker. Attempt was unsuccessful. MD made aware.

## 2014-12-05 NOTE — H&P (Signed)
[facility] History and Physical  Rick Cruz [id number] DOB: 01-24-1946 DOA: 12/05/2014  Referring physician: ER PCP: Rick Gravel, MD   Chief Complaint: Recurrent falls  HPI: Rick Cruz is a 69 y.o. male  This is a 69 year old man status post fall that made him be brought to the emergency room. He says that he has had several falls in the last few weeks. In fact, he has had 4 falls in the last 24 hours, the last fall causing him to stay in the floor for 5 hours, unable to get up. He normally walks with a walker at home. The patient has chronic neck and back pain and takes narcotic medications for this. There is no loss of consciousness or any head injury. There is no chest pain, palpitations, dizziness. There is no nausea or vomiting. Per EMS, his CBG was 378 and not hypoglycemic. He is diabetic. He is now being admitted for further management of these falls and possible adjustments to medications.   Review of Systems:  Apart from symptoms above, all systems negative.  Past Medical History  Diagnosis Date  . COPD (chronic obstructive pulmonary disease)   . CHF (congestive heart failure)     diastolic   . Hypercholesterolemia   . Depression   . Schizophrenia   . Sleep apnea     noncompliant with BiPAP  . Tobacco abuse   . On home O2     3 liters  . Chronic pain   . Diabetes mellitus without complication     insulin pump   Past Surgical History  Procedure Laterality Date  . Knee surgery      X2  . 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   . Colonoscopy  July 2011    Dr. Benson Norway: multiple polyps, internal and external hemorrhoids, diverticulosis. tubular adenoma  . Esophagogastroduodenoscopy (egd) with propofol N/A 04/20/2013    Procedure: ESOPHAGOGASTRODUODENOSCOPY (EGD) WITH PROPOFOL;  Surgeon: Daneil Dolin, MD;  Location: [facility];  Service: Endoscopy;  Laterality: N/A;   Social  History:  reports that he quit smoking about 14 months ago. His smoking use included Cigarettes. He has a 50 pack-year smoking history. He has never used smokeless tobacco. He reports that he does not drink alcohol or use illicit drugs.  Allergies  Allergen Reactions  . Phenergan [Promethazine Hcl] Other (See Comments)    Becomes very confused and aggressive  . Haldol [Haloperidol Lactate] Other (See Comments)    Shaking     Family History  Problem Relation Age of Onset  . Colon cancer Neg Hx   . Parkinson's disease    . Thyroid disease Mother   . Parkinson's disease Father     Prior to Admission medications   Medication Sig Start Date End Date Taking? Authorizing Provider  albuterol (PROVENTIL HFA;VENTOLIN HFA) 108 (90 BASE) MCG/ACT inhaler Inhale 2 puffs into the lungs every 6 (six) hours as needed for wheezing or shortness of breath. 05/11/14  Yes Lezlie Octave Black, NP  albuterol (PROVENTIL) (2.5 MG/3ML) 0.083% nebulizer solution Take 3 mLs (2.5 mg total) by nebulization every 6 (six) hours as needed for wheezing or shortness of breath. 05/11/14  Yes Radene Gunning, NP  aspirin 81 MG chewable tablet Chew 81 mg by mouth every morning.    Yes Historical Provider, MD  brimonidine (ALPHAGAN) 0.15 % ophthalmic solution Place 1 drop into the right eye 2 (  two) times daily.    Yes Historical Provider, MD  Cinnamon 500 MG capsule Take 1,000 mg by mouth every morning.    Yes Historical Provider, MD  citalopram (CELEXA) 20 MG tablet Take 20 mg by mouth every morning.   Yes Historical Provider, MD  clopidogrel (PLAVIX) 75 MG tablet Take 75 mg by mouth every morning.   Yes Historical Provider, MD  fentaNYL (DURAGESIC - DOSED MCG/HR) 50 MCG/HR Place 50 mcg onto the skin every 3 (three) days. 07/20/14  Yes Historical Provider, MD  furosemide (LASIX) 40 MG tablet Take 1 tablet (40 mg total) by mouth daily. 08/10/14  Yes Kathie Dike, MD  gabapentin (NEURONTIN) 300 MG capsule Take 300 mg by mouth 3 (three)  times daily.   Yes Historical Provider, MD  guaiFENesin (MUCINEX) 600 MG 12 hr tablet Take 1 tablet (600 mg total) by mouth 2 (two) times daily. 08/10/14  Yes Kathie Dike, MD  HYDROcodone-acetaminophen (NORCO/VICODIN) 5-325 MG per tablet Take 1 tablet by mouth every 6 (six) hours as needed for moderate pain.   Yes Historical Provider, MD  ibuprofen (ADVIL,MOTRIN) 200 MG tablet Take 200 mg by mouth every 4 (four) hours as needed for moderate pain.   Yes Historical Provider, MD  Insulin Human (INSULIN PUMP) SOLN Inject into the skin continuous. Humalog   Yes Historical Provider, MD  LORazepam (ATIVAN) 1 MG tablet Take 0.5 tablets (0.5 mg total) by mouth 2 (two) times daily as needed for anxiety. Patient taking differently: Take 0.5 mg by mouth at bedtime.  08/10/14  Yes Kathie Dike, MD  metoprolol (LOPRESSOR) 50 MG tablet Take 50 mg by mouth 2 (two) times daily.   Yes Historical Provider, MD  pantoprazole (PROTONIX) 40 MG tablet Take 40 mg by mouth 2 (two) times daily.   Yes Historical Provider, MD  simvastatin (ZOCOR) 40 MG tablet Take 40 mg by mouth every evening.   Yes Historical Provider, MD  terazosin (HYTRIN) 1 MG capsule Take 1 mg by mouth at bedtime.   Yes Historical Provider, MD  tiotropium (SPIRIVA HANDIHALER) 18 MCG inhalation capsule Place 1 capsule (18 mcg total) into inhaler and inhale every morning. 05/11/14  Yes Radene Gunning, NP  traZODone (DESYREL) 150 MG tablet Take 300 mg by mouth at bedtime.    Yes Historical Provider, MD  triamcinolone cream (KENALOG) 0.1 % Apply 1 application topically 2 (two) times daily as needed (for irritation).    Yes Historical Provider, MD  ziprasidone (GEODON) 80 MG capsule Take 80 mg by mouth every evening.   Yes Historical Provider, MD  predniSONE (DELTASONE) 10 MG tablet Take 40mg  po daily for 2 days then 30mg  po daily for 2 days then 20mg  po daily for 2 days then 10mg  po daily for 2 days then stop Patient not taking: Reported on 12/05/2014  08/10/14   Kathie Dike, MD   Physical Exam: Filed Vitals:   12/05/14 1400 12/05/14 1530 12/05/14 1600 12/05/14 1630  BP: 140/76 91/78 115/70 136/68  Pulse: 98 82 83 83  Resp: 16 12 12 16   SpO2: 100% 93% 91% 94%    Wt Readings from Last 3 Encounters:  08/17/14 132.904 kg (293 lb)  08/09/14 131.4 kg (289 lb 11 oz)  07/21/14 131.543 kg (290 lb)    General:  Appears calm and comfortable. He does appear to be alert and oriented. Eyes: PERRL, normal lids, irises & conjunctiva ENT: grossly normal hearing, lips & tongue Neck: no LAD, masses or thyromegaly Cardiovascular: RRR, no m/r/g.  No LE edema. Telemetry: SR, no arrhythmias  Respiratory: CTA bilaterally, no w/r/r. Normal respiratory effort. Abdomen: soft, ntnd Skin: Rash on the face, possibly eczema. Musculoskeletal: grossly normal tone BUE/BLE Psychiatric: grossly normal mood and affect, speech fluent and appropriate Neurologic: grossly non-focal.          Labs on Admission:  Basic Metabolic Panel:  Recent Labs Lab 12/05/14 1252  NA 135  K 5.3*  CL 93*  CO2 33*  GLUCOSE 311*  BUN 15  CREATININE 1.31  CALCIUM 8.8   Liver Function Tests: No results for input(s): AST, ALT, ALKPHOS, BILITOT, PROT, ALBUMIN in the last 168 hours. No results for input(s): LIPASE, AMYLASE in the last 168 hours. No results for input(s): AMMONIA in the last 168 hours. CBC:  Recent Labs Lab 12/05/14 1252  WBC 13.1*  NEUTROABS 11.5*  HGB 11.5*  HCT 39.4  MCV 91.2  PLT 174   Cardiac Enzymes:  Recent Labs Lab 12/05/14 1252  CKTOTAL 535*  TROPONINI 0.03    BNP (last 3 results) No results for input(s): BNP in the last 8760 hours.  ProBNP (last 3 results)  Recent Labs  07/14/14 1830 07/21/14 0143 08/04/14 1525  PROBNP 211.1* 99.9 277.3*    CBG: No results for input(s): GLUCAP in the last 168 hours.  Radiological Exams on Admission: Dg Chest 1 View  12/05/2014   CLINICAL DATA:  Status post fall  EXAM: CHEST  1  VIEW  COMPARISON:  08/04/2014  FINDINGS: Lungs are essentially clear. No frank interstitial edema. No pleural effusion or pneumothorax.  Mild cardiomegaly.  IMPRESSION: No evidence of acute cardiopulmonary disease.   Electronically Signed   By: Julian Hy M.D.   On: 12/05/2014 15:20   Dg Thoracic Spine 2 View  12/05/2014   CLINICAL DATA:  Status post fall  EXAM: THORACIC SPINE - 2 VIEW  COMPARISON:  None.  FINDINGS: Normal thoracic kyphosis.  No evidence of fracture or dislocation. Vertebral body heights and intervertebral disc spaces are maintained.  Mild degenerative changes of the mid thoracic spine.  Visualized lungs are clear.  IMPRESSION: No fracture or dislocation is seen.   Electronically Signed   By: Julian Hy M.D.   On: 12/05/2014 15:21   Dg Lumbar Spine Complete  12/05/2014   CLINICAL DATA:  Recent falls, all  EXAM: LUMBAR SPINE - COMPLETE 4+ VIEW  COMPARISON:  MR lumbar spine 06/18/2013  FINDINGS: Normal alignment of lumbar vertebral bodies. No loss vertebral body height and disc height. Minimal endplate spurring. No pars fracture.  IMPRESSION: No acute findings lumbar spine.  No change comparison MRI.   Electronically Signed   By: Suzy Bouchard M.D.   On: 12/05/2014 15:22   Dg Pelvis 1-2 Views  12/05/2014   CLINICAL DATA:  Status post fall, right lower leg pain  EXAM: PELVIS - 1-2 VIEW  COMPARISON:  None.  FINDINGS: No fracture or dislocation is seen.  Bilateral hip joint spaces are preserved.  Visualized bony pelvis appears intact.  IMPRESSION: No fracture or dislocation is seen.   Electronically Signed   By: Julian Hy M.D.   On: 12/05/2014 15:20   Dg Tibia/fibula Right  12/05/2014   CLINICAL DATA:  Multiple falls with lower leg pain, initial encounter  EXAM: RIGHT TIBIA AND FIBULA - 2 VIEW  COMPARISON:  05/08/2014  FINDINGS: There is an undisplaced fracture in the proximal fibular diaphysis identified. Additionally, the previously seen fracture is again identified  with callus formation. The proximal fibular fracture  shows no significant callus formation is likely acute in nature. Additionally there is a lucency along the medial malleolus consistent with acute fracture.  IMPRESSION: New medial malleolar fracture and proximal fibular fracture with healing old distal fibular fracture.   Electronically Signed   By: Inez Catalina M.D.   On: 12/05/2014 15:22   Ct Head Wo Contrast  12/05/2014   CLINICAL DATA:  Fall, pt states he fell out of his recliner today. Ems reports patient has had multiple falls over last 4 days. Pt states he did not hilt his head but is sore all over./  EXAM: CT HEAD WITHOUT CONTRAST  CT CERVICAL SPINE WITHOUT CONTRAST  TECHNIQUE: Multidetector CT imaging of the head and cervical spine was performed following the standard protocol without intravenous contrast. Multiplanar CT image reconstructions of the cervical spine were also generated.  COMPARISON:  Head CT, 05/08/2014  FINDINGS: CT HEAD FINDINGS  Ventricles are normal in configuration. There is ventricular and sulcal enlargement reflecting mild atrophy. No parenchymal masses or mass effect. There is no evidence of a cortical infarct. Patchy white matter hypoattenuation is noted consistent mild to moderate chronic microvascular ischemic change.  No extra-axial masses or abnormal fluid collections.  No intracranial hemorrhage.  Visualized sinuses essentially clear. Clear mastoid air cells. No skull fracture.  CT CERVICAL SPINE FINDINGS  No fracture or spondylolisthesis. Reversal of the normal cervical lordosis. There is loss of disc height with endplate spurring most evident at C4-C5, C5-C6 and C6-C7. Facet degenerative changes noted bilaterally in the upper cervical spine. There is neural foraminal narrowing at multiple levels, greatest on the left at C3-C4, moderate-to-severe from facet and uncovertebral spurring, and on the right from uncovertebral facet spurring at C4-C5, severe in degree.  No  significant soft tissue abnormality.  Lung apices are clear.  IMPRESSION: HEAD CT: No acute intracranial abnormalities. Mild atrophy and mild to moderate chronic microvascular ischemic change.  CERVICAL CT:  No fracture or acute finding.   Electronically Signed   By: Lajean Manes M.D.   On: 12/05/2014 14:36   Ct Cervical Spine Wo Contrast  12/05/2014   CLINICAL DATA:  Fall, pt states he fell out of his recliner today. Ems reports patient has had multiple falls over last 4 days. Pt states he did not hilt his head but is sore all over./  EXAM: CT HEAD WITHOUT CONTRAST  CT CERVICAL SPINE WITHOUT CONTRAST  TECHNIQUE: Multidetector CT imaging of the head and cervical spine was performed following the standard protocol without intravenous contrast. Multiplanar CT image reconstructions of the cervical spine were also generated.  COMPARISON:  Head CT, 05/08/2014  FINDINGS: CT HEAD FINDINGS  Ventricles are normal in configuration. There is ventricular and sulcal enlargement reflecting mild atrophy. No parenchymal masses or mass effect. There is no evidence of a cortical infarct. Patchy white matter hypoattenuation is noted consistent mild to moderate chronic microvascular ischemic change.  No extra-axial masses or abnormal fluid collections.  No intracranial hemorrhage.  Visualized sinuses essentially clear. Clear mastoid air cells. No skull fracture.  CT CERVICAL SPINE FINDINGS  No fracture or spondylolisthesis. Reversal of the normal cervical lordosis. There is loss of disc height with endplate spurring most evident at C4-C5, C5-C6 and C6-C7. Facet degenerative changes noted bilaterally in the upper cervical spine. There is neural foraminal narrowing at multiple levels, greatest on the left at C3-C4, moderate-to-severe from facet and uncovertebral spurring, and on the right from uncovertebral facet spurring at C4-C5, severe in degree.  No significant  soft tissue abnormality.  Lung apices are clear.  IMPRESSION: HEAD  CT: No acute intracranial abnormalities. Mild atrophy and mild to moderate chronic microvascular ischemic change.  CERVICAL CT:  No fracture or acute finding.   Electronically Signed   By: Lajean Manes M.D.   On: 12/05/2014 14:36    EKG: Independently reviewed. Sinus rhythm. Right bundle branch block. No acute ST-T wave changes.  Assessment/Plan   1. Recurrent falls. The etiology of this is probably multifactorial. He does have obstructive sleep apnea and he may have CO2 retention causing mental status changes. Compounding this, he is on several psychotropic medications including opioids. He takes fentanyl and hydrocodone. He is also on benzodiazepines, trazodone, Neurontin and Geodon as well as Celexa. He takes the opioids for chronic neck and back pain. Recent CDC guidelines clearly stated that opioids would not be recommended as the first-line treatment for chronic pain. I'm going to discontinue the fentanyl. I will also reduce trazodone dose. We will ask physical and occupational therapy to review the patient. He may need temporary placement to a skilled nursing facility based on their findings. 2. New right medial malleolar and proximal fibula fracture. We will splint the area and ask orthopedics to see the patient tomorrow. 3. Diabetes. Continue with sliding scale and subcutaneous Lantus. 4. Hypertension. Stable. 5. COPD. Stable. 6. Obstructive sleep apnea. Appears to be stable. Check arterial blood gas.  Further recommendations will depend on patient's hospital progress.   Code Status: Full code.   DVT Prophylaxis: Heparin.  Family Communication: I discussed the plan with the patient at the bedside.   Disposition Plan: Depending on progress.   Time spent: 60 minutes.  Doree Albee [facility] Pager [phone].

## 2014-12-06 DIAGNOSIS — T50901A Poisoning by unspecified drugs, medicaments and biological substances, accidental (unintentional), initial encounter: Secondary | ICD-10-CM

## 2014-12-06 LAB — COMPREHENSIVE METABOLIC PANEL
ALT: 11 U/L (ref 0–53)
ANION GAP: 5 (ref 5–15)
AST: 15 U/L (ref 0–37)
Albumin: 3.2 g/dL — ABNORMAL LOW (ref 3.5–5.2)
Alkaline Phosphatase: 70 U/L (ref 39–117)
BUN: 10 mg/dL (ref 6–23)
CALCIUM: 8.4 mg/dL (ref 8.4–10.5)
CO2: 36 mmol/L — ABNORMAL HIGH (ref 19–32)
CREATININE: 0.95 mg/dL (ref 0.50–1.35)
Chloride: 98 mmol/L (ref 96–112)
GFR calc non Af Amer: 84 mL/min — ABNORMAL LOW (ref >90)
GLUCOSE: 199 mg/dL — AB (ref 70–99)
Potassium: 4.5 mmol/L (ref 3.5–5.1)
Sodium: 139 mmol/L (ref 135–145)
Total Bilirubin: 0.7 mg/dL (ref 0.3–1.2)
Total Protein: 5.7 g/dL — ABNORMAL LOW (ref 6.0–8.3)

## 2014-12-06 LAB — CBC
HCT: 37.2 % — ABNORMAL LOW (ref 39.0–52.0)
HEMOGLOBIN: 10.8 g/dL — AB (ref 13.0–17.0)
MCH: 26.9 pg (ref 26.0–34.0)
MCHC: 29 g/dL — ABNORMAL LOW (ref 30.0–36.0)
MCV: 92.5 fL (ref 78.0–100.0)
Platelets: 146 10*3/uL — ABNORMAL LOW (ref 150–400)
RBC: 4.02 MIL/uL — ABNORMAL LOW (ref 4.22–5.81)
RDW: 13.8 % (ref 11.5–15.5)
WBC: 7.8 10*3/uL (ref 4.0–10.5)

## 2014-12-06 LAB — GLUCOSE, CAPILLARY
GLUCOSE-CAPILLARY: 241 mg/dL — AB (ref 70–99)
GLUCOSE-CAPILLARY: 159 mg/dL — AB (ref 70–99)
Glucose-Capillary: 194 mg/dL — ABNORMAL HIGH (ref 70–99)
Glucose-Capillary: 287 mg/dL — ABNORMAL HIGH (ref 70–99)

## 2014-12-06 LAB — CK: Total CK: 347 U/L — ABNORMAL HIGH (ref 7–232)

## 2014-12-06 MED ORDER — INSULIN GLARGINE 100 UNIT/ML ~~LOC~~ SOLN
30.0000 [IU] | Freq: Every day | SUBCUTANEOUS | Status: DC
  Administered 2014-12-06 – 2014-12-07 (×2): 30 [IU] via SUBCUTANEOUS
  Filled 2014-12-06 (×3): qty 0.3

## 2014-12-06 MED ORDER — INSULIN ASPART 100 UNIT/ML ~~LOC~~ SOLN
6.0000 [IU] | Freq: Three times a day (TID) | SUBCUTANEOUS | Status: DC
  Administered 2014-12-06 – 2014-12-07 (×3): 6 [IU] via SUBCUTANEOUS

## 2014-12-06 MED ORDER — INSULIN ASPART 100 UNIT/ML ~~LOC~~ SOLN: 0.0000 [IU] | Freq: Every day | SUBCUTANEOUS | Status: DC

## 2014-12-06 MED ORDER — INSULIN ASPART 100 UNIT/ML ~~LOC~~ SOLN
0.0000 [IU] | Freq: Three times a day (TID) | SUBCUTANEOUS | Status: DC
  Administered 2014-12-06: 5 [IU] via SUBCUTANEOUS
  Administered 2014-12-07: 2 [IU] via SUBCUTANEOUS
  Administered 2014-12-07: 5 [IU] via SUBCUTANEOUS

## 2014-12-06 NOTE — Consult Note (Signed)
Reason for Consult:Fracture right medial malleolus and proximal right fibula Referring Physician: Hospitalist  Rick Cruz is an 69 y.o. male.  HPI: He has history of multiple falls at home.  He fell four or five times on day prior to admission.  He has history of fracture distal fibula in October.  He says his medicines made him lose his balance.  He has no other injury except to the right medial ankle and proximal lateral right leg.  He has nondisplaced fracture of proximal fibular shaft, a small avulsion fracture of the medial malleolus and a healing fracture with callus present of the distal fibular shaft above the ankle.    Past Medical History  Diagnosis Date  . COPD (chronic obstructive pulmonary disease)   . CHF (congestive heart failure)     diastolic   . Hypercholesterolemia   . Depression   . Schizophrenia   . Sleep apnea     noncompliant with BiPAP  . Tobacco abuse   . On home O2     3 liters  . Chronic pain   . Diabetes mellitus without complication     insulin pump    Past Surgical History  Procedure Laterality Date  . Knee surgery      X2  . 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   . Colonoscopy  July 2011    Dr. Benson Norway: multiple polyps, internal and external hemorrhoids, diverticulosis. tubular adenoma  . Esophagogastroduodenoscopy (egd) with propofol N/A 04/20/2013    Procedure: ESOPHAGOGASTRODUODENOSCOPY (EGD) WITH PROPOFOL;  Surgeon: Daneil Dolin, MD;  Location: [facility];  Service: Endoscopy;  Laterality: N/A;    Family History  Problem Relation Age of Onset  . Colon cancer Neg Hx   . Parkinson's disease    . Thyroid disease Mother   . Parkinson's disease Father     Social History:  reports that he quit smoking about 14 months ago. His smoking use included Cigarettes. He has a 50 pack-year smoking history. He has never used smokeless tobacco. He reports that he does not  drink alcohol or use illicit drugs.  Allergies:  Allergies  Allergen Reactions  . Phenergan [Promethazine Hcl] Other (See Comments)    Becomes very confused and aggressive  . Haldol [Haloperidol Lactate] Other (See Comments)    Shaking     Medications: I have reviewed the patient's current medications.  Results for orders placed or performed during the hospital encounter of 12/05/14 (from the past 48 hour(s))  CBC with Differential     Status: Abnormal   Collection Time: 12/05/14 12:52 PM  Result Value Ref Range   WBC 13.1 (H) 4.0 - 10.5 K/uL   RBC 4.32 4.22 - 5.81 MIL/uL   Hemoglobin 11.5 (L) 13.0 - 17.0 g/dL   HCT 39.4 39.0 - 52.0 %   MCV 91.2 78.0 - 100.0 fL   MCH 26.6 26.0 - 34.0 pg   MCHC 29.2 (L) 30.0 - 36.0 g/dL   RDW 13.7 11.5 - 15.5 %   Platelets 174 150 - 400 K/uL   Neutrophils Relative % 88 (H) 43 - 77 %   Neutro Abs 11.5 (H) 1.7 - 7.7 K/uL   Lymphocytes Relative 6 (L) 12 - 46 %   Lymphs Abs 0.8 0.7 - 4.0 K/uL   Monocytes Relative 6 3 - 12 %   Monocytes Absolute 0.7 0.1 - 1.0 K/uL   Eosinophils Relative  0 0 - 5 %   Eosinophils Absolute 0.0 0.0 - 0.7 K/uL   Basophils Relative 0 0 - 1 %   Basophils Absolute 0.0 0.0 - 0.1 K/uL  Basic metabolic panel     Status: Abnormal   Collection Time: 12/05/14 12:52 PM  Result Value Ref Range   Sodium 135 135 - 145 mmol/L   Potassium 5.3 (H) 3.5 - 5.1 mmol/L   Chloride 93 (L) 96 - 112 mmol/L   CO2 33 (H) 19 - 32 mmol/L   Glucose, Bld 311 (H) 70 - 99 mg/dL   BUN 15 6 - 23 mg/dL   Creatinine, Ser 1.31 0.50 - 1.35 mg/dL   Calcium 8.8 8.4 - 10.5 mg/dL   GFR calc non Af Amer 54 (L) >90 mL/min   GFR calc Af Amer 63 (L) >90 mL/min    Comment: (NOTE) The eGFR has been calculated using the CKD EPI equation. This calculation has not been validated in all clinical situations. eGFR's persistently <90 mL/min signify possible Chronic Kidney Disease.    Anion gap 9 5 - 15  Troponin I     Status: None   Collection Time: 12/05/14  12:52 PM  Result Value Ref Range   Troponin I 0.03 <0.031 ng/mL    Comment:        NO INDICATION OF MYOCARDIAL INJURY.   CK     Status: Abnormal   Collection Time: 12/05/14 12:52 PM  Result Value Ref Range   Total CK 535 (H) 7 - 232 U/L  I-Stat CG4 Lactic Acid, ED     Status: Abnormal   Collection Time: 12/05/14  1:17 PM  Result Value Ref Range   Lactic Acid, Venous 2.83 (HH) 0.5 - 2.0 mmol/L   Comment NOTIFIED PHYSICIAN   Urinalysis, Routine w reflex microscopic     Status: Abnormal   Collection Time: 12/05/14  3:19 PM  Result Value Ref Range   Color, Urine YELLOW YELLOW   APPearance CLEAR CLEAR   Specific Gravity, Urine <1.005 (L) 1.005 - 1.030   pH 6.5 5.0 - 8.0   Glucose, UA 500 (A) NEGATIVE mg/dL   Hgb urine dipstick NEGATIVE NEGATIVE   Bilirubin Urine NEGATIVE NEGATIVE   Ketones, ur NEGATIVE NEGATIVE mg/dL   Protein, ur NEGATIVE NEGATIVE mg/dL   Urobilinogen, UA 1.0 0.0 - 1.0 mg/dL   Nitrite NEGATIVE NEGATIVE   Leukocytes, UA NEGATIVE NEGATIVE    Comment: MICROSCOPIC NOT DONE ON URINES WITH NEGATIVE PROTEIN, BLOOD, LEUKOCYTES, NITRITE, OR GLUCOSE <1000 mg/dL.  Blood gas, arterial     Status: Abnormal   Collection Time: 12/05/14  8:45 PM  Result Value Ref Range   O2 Content 2.0 L/min   Delivery systems NASAL CANNULA    pH, Arterial 7.424 7.350 - 7.450   pCO2 arterial 55.4 (H) 35.0 - 45.0 mmHg   pO2, Arterial 67.5 (L) 80.0 - 100.0 mmHg   Bicarbonate 35.6 (H) 20.0 - 24.0 mEq/L   TCO2 32.4 0 - 100 mmol/L   Acid-Base Excess 10.7 (H) 0.0 - 2.0 mmol/L   O2 Saturation 93.9 %   Patient temperature 37.0    Collection site RADIAL    Drawn by [id number]    Sample type ARTERIAL    Allens test (pass/fail) PASS PASS  Comprehensive metabolic panel     Status: Abnormal   Collection Time: 12/06/14  5:15 AM  Result Value Ref Range   Sodium 139 135 - 145 mmol/L   Potassium 4.5 3.5 - 5.1 mmol/L  Chloride 98 96 - 112 mmol/L   CO2 36 (H) 19 - 32 mmol/L   Glucose, Bld 199 (H) 70 -  99 mg/dL   BUN 10 6 - 23 mg/dL   Creatinine, Ser 0.95 0.50 - 1.35 mg/dL   Calcium 8.4 8.4 - 10.5 mg/dL   Total Protein 5.7 (L) 6.0 - 8.3 g/dL   Albumin 3.2 (L) 3.5 - 5.2 g/dL   AST 15 0 - 37 U/L   ALT 11 0 - 53 U/L   Alkaline Phosphatase 70 39 - 117 U/L   Total Bilirubin 0.7 0.3 - 1.2 mg/dL   GFR calc non Af Amer 84 (L) >90 mL/min   GFR calc Af Amer >90 >90 mL/min    Comment: (NOTE) The eGFR has been calculated using the CKD EPI equation. This calculation has not been validated in all clinical situations. eGFR's persistently <90 mL/min signify possible Chronic Kidney Disease.    Anion gap 5 5 - 15  CBC     Status: Abnormal   Collection Time: 12/06/14  5:15 AM  Result Value Ref Range   WBC 7.8 4.0 - 10.5 K/uL   RBC 4.02 (L) 4.22 - 5.81 MIL/uL   Hemoglobin 10.8 (L) 13.0 - 17.0 g/dL   HCT 37.2 (L) 39.0 - 52.0 %   MCV 92.5 78.0 - 100.0 fL   MCH 26.9 26.0 - 34.0 pg   MCHC 29.0 (L) 30.0 - 36.0 g/dL   RDW 13.8 11.5 - 15.5 %   Platelets 146 (L) 150 - 400 K/uL  CK     Status: Abnormal   Collection Time: 12/06/14  5:15 AM  Result Value Ref Range   Total CK 347 (H) 7 - 232 U/L  Glucose, capillary     Status: Abnormal   Collection Time: 12/06/14  8:06 AM  Result Value Ref Range   Glucose-Capillary 194 (H) 70 - 99 mg/dL  Glucose, capillary     Status: Abnormal   Collection Time: 12/06/14 12:01 PM  Result Value Ref Range   Glucose-Capillary 287 (H) 70 - 99 mg/dL    Dg Chest 1 View  12/05/2014   CLINICAL DATA:  Status post fall  EXAM: CHEST  1 VIEW  COMPARISON:  08/04/2014  FINDINGS: Lungs are essentially clear. No frank interstitial edema. No pleural effusion or pneumothorax.  Mild cardiomegaly.  IMPRESSION: No evidence of acute cardiopulmonary disease.   Electronically Signed   By: Julian Hy M.D.   On: 12/05/2014 15:20   Dg Thoracic Spine 2 View  12/05/2014   CLINICAL DATA:  Status post fall  EXAM: THORACIC SPINE - 2 VIEW  COMPARISON:  None.  FINDINGS: Normal thoracic  kyphosis.  No evidence of fracture or dislocation. Vertebral body heights and intervertebral disc spaces are maintained.  Mild degenerative changes of the mid thoracic spine.  Visualized lungs are clear.  IMPRESSION: No fracture or dislocation is seen.   Electronically Signed   By: Julian Hy M.D.   On: 12/05/2014 15:21   Dg Lumbar Spine Complete  12/05/2014   CLINICAL DATA:  Recent falls, all  EXAM: LUMBAR SPINE - COMPLETE 4+ VIEW  COMPARISON:  MR lumbar spine 06/18/2013  FINDINGS: Normal alignment of lumbar vertebral bodies. No loss vertebral body height and disc height. Minimal endplate spurring. No pars fracture.  IMPRESSION: No acute findings lumbar spine.  No change comparison MRI.   Electronically Signed   By: Suzy Bouchard M.D.   On: 12/05/2014 15:22   Dg Pelvis 1-2  Views  12/05/2014   CLINICAL DATA:  Status post fall, right lower leg pain  EXAM: PELVIS - 1-2 VIEW  COMPARISON:  None.  FINDINGS: No fracture or dislocation is seen.  Bilateral hip joint spaces are preserved.  Visualized bony pelvis appears intact.  IMPRESSION: No fracture or dislocation is seen.   Electronically Signed   By: Julian Hy M.D.   On: 12/05/2014 15:20   Dg Tibia/fibula Right  12/05/2014   CLINICAL DATA:  Multiple falls with lower leg pain, initial encounter  EXAM: RIGHT TIBIA AND FIBULA - 2 VIEW  COMPARISON:  05/08/2014  FINDINGS: There is an undisplaced fracture in the proximal fibular diaphysis identified. Additionally, the previously seen fracture is again identified with callus formation. The proximal fibular fracture shows no significant callus formation is likely acute in nature. Additionally there is a lucency along the medial malleolus consistent with acute fracture.  IMPRESSION: New medial malleolar fracture and proximal fibular fracture with healing old distal fibular fracture.   Electronically Signed   By: Inez Catalina M.D.   On: 12/05/2014 15:22   Ct Head Wo Contrast  12/05/2014   CLINICAL  DATA:  Fall, pt states he fell out of his recliner today. Ems reports patient has had multiple falls over last 4 days. Pt states he did not hilt his head but is sore all over./  EXAM: CT HEAD WITHOUT CONTRAST  CT CERVICAL SPINE WITHOUT CONTRAST  TECHNIQUE: Multidetector CT imaging of the head and cervical spine was performed following the standard protocol without intravenous contrast. Multiplanar CT image reconstructions of the cervical spine were also generated.  COMPARISON:  Head CT, 05/08/2014  FINDINGS: CT HEAD FINDINGS  Ventricles are normal in configuration. There is ventricular and sulcal enlargement reflecting mild atrophy. No parenchymal masses or mass effect. There is no evidence of a cortical infarct. Patchy white matter hypoattenuation is noted consistent mild to moderate chronic microvascular ischemic change.  No extra-axial masses or abnormal fluid collections.  No intracranial hemorrhage.  Visualized sinuses essentially clear. Clear mastoid air cells. No skull fracture.  CT CERVICAL SPINE FINDINGS  No fracture or spondylolisthesis. Reversal of the normal cervical lordosis. There is loss of disc height with endplate spurring most evident at C4-C5, C5-C6 and C6-C7. Facet degenerative changes noted bilaterally in the upper cervical spine. There is neural foraminal narrowing at multiple levels, greatest on the left at C3-C4, moderate-to-severe from facet and uncovertebral spurring, and on the right from uncovertebral facet spurring at C4-C5, severe in degree.  No significant soft tissue abnormality.  Lung apices are clear.  IMPRESSION: HEAD CT: No acute intracranial abnormalities. Mild atrophy and mild to moderate chronic microvascular ischemic change.  CERVICAL CT:  No fracture or acute finding.   Electronically Signed   By: Lajean Manes M.D.   On: 12/05/2014 14:36   Ct Cervical Spine Wo Contrast  12/05/2014   CLINICAL DATA:  Fall, pt states he fell out of his recliner today. Ems reports patient has  had multiple falls over last 4 days. Pt states he did not hilt his head but is sore all over./  EXAM: CT HEAD WITHOUT CONTRAST  CT CERVICAL SPINE WITHOUT CONTRAST  TECHNIQUE: Multidetector CT imaging of the head and cervical spine was performed following the standard protocol without intravenous contrast. Multiplanar CT image reconstructions of the cervical spine were also generated.  COMPARISON:  Head CT, 05/08/2014  FINDINGS: CT HEAD FINDINGS  Ventricles are normal in configuration. There is ventricular and sulcal enlargement reflecting  mild atrophy. No parenchymal masses or mass effect. There is no evidence of a cortical infarct. Patchy white matter hypoattenuation is noted consistent mild to moderate chronic microvascular ischemic change.  No extra-axial masses or abnormal fluid collections.  No intracranial hemorrhage.  Visualized sinuses essentially clear. Clear mastoid air cells. No skull fracture.  CT CERVICAL SPINE FINDINGS  No fracture or spondylolisthesis. Reversal of the normal cervical lordosis. There is loss of disc height with endplate spurring most evident at C4-C5, C5-C6 and C6-C7. Facet degenerative changes noted bilaterally in the upper cervical spine. There is neural foraminal narrowing at multiple levels, greatest on the left at C3-C4, moderate-to-severe from facet and uncovertebral spurring, and on the right from uncovertebral facet spurring at C4-C5, severe in degree.  No significant soft tissue abnormality.  Lung apices are clear.  IMPRESSION: HEAD CT: No acute intracranial abnormalities. Mild atrophy and mild to moderate chronic microvascular ischemic change.  CERVICAL CT:  No fracture or acute finding.   Electronically Signed   By: Lajean Manes M.D.   On: 12/05/2014 14:36    Review of Systems  Respiratory:       COPD and sleep apnea  Cardiovascular:       Hypertension, history of congestive heart failure, chronic shortness of breath, on supplemental oxygen at home, increased  cholesterol, history of hyperkalemia  Gastrointestinal:       Erosive esophagitis, morbid obesity  Musculoskeletal: Positive for falls (Has many recent falls, four or five the day he was admitted.  Prior fracture of right lateral malleolus in fall. Pain right ankle.).       Fracture right lateral malleolus in October, 2015.  Endo/Heme/Allergies:       Diabetes mellitus on insulin pump   Blood pressure 128/79, pulse 78, temperature 98.2 F (36.8 C), temperature source Oral, resp. rate 20, height 6' (1.829 m), weight 142.203 kg (313 lb 8 oz), SpO2 92 %. Physical Exam  Constitutional: He is oriented to person, place, and time. He appears well-developed and well-nourished.  Morbidly obese, on supplemental oxygen by nasal cannula.  HENT:  Head: Normocephalic and atraumatic.  Eyes: Conjunctivae and EOM are normal. Pupils are equal, round, and reactive to light.  Neck: Normal range of motion. Neck supple.  Cardiovascular: Normal rate and intact distal pulses.   Respiratory: Effort normal.  GI: Soft.  Musculoskeletal: He exhibits tenderness (In posterior splint on the right lower leg, pain medial ankle and proximal lateral fibula.  NV intact.).  Neurological: He is alert and oriented to person, place, and time.  Skin: Skin is warm and dry.  Psychiatric: He has a normal mood and affect. His behavior is normal. Judgment and thought content normal.    Assessment/Plan: Acute fracture tip of the medial malleolus on the right ankle, acute fracture proximal fibula shaft. Old healing fracture of right distal fibular shaft above ankle Morbid Obesity. History of multiple falls, remote and recent. Hypertension and heart disease. COPD and sleep apnea.  He will not need surgery on the fractures.  A CAM walker is ordered for him to wear when out of bed.  He may bear weight as tolerated but will most likely need walker for the next several weeks and limit weight bearing.  PT ordered.  I can see in  office in two weeks with x-rays then.  Matin Mattioli 12/06/2014, 1:10 PM

## 2014-12-06 NOTE — Progress Notes (Signed)
PT Cancellation Note  Patient Details Name: Rick Cruz MRN: [id number] DOB: [birth year]   Cancelled Treatment:    Reason Eval/Treat Not Completed: Patient not medically ready.  Pt to see Orthopedic Surgeon for evaluation.  Will await referral from that MD as surgery may very well be done.   Demetrios Isaacs L 12/06/2014, 11:13 AM

## 2014-12-06 NOTE — Care Management Note (Addendum)
    Page 1 of 1   12/07/2014     12:40:00 PM CARE MANAGEMENT NOTE 12/07/2014  Patient:  Rick Cruz, Rick Cruz   Account Number:  [id number]  Date Initiated:  12/06/2014  Documentation initiated by:  Theophilus Kinds  Subjective/Objective Assessment:   Pt admitted from home with fractured ankle. Pt lives with his wife and son and will return home at discharge. Pt has ramp, home O2 with [employer], walker and cane.     Action/Plan:   Pt chooses [facility] for [facility] PT and wheelchair and BSC. Referrals called to Romualdo Bolk of [facility] and [place] with [facility]. Wheelchair and BSC will be delivered to pts home. [place] services to start within 48 hours of discharge. Pt and pts RN aware.   Anticipated DC Date:  12/07/2014   Anticipated DC Plan:  [place] referral  Clinical Social Worker      DC Planning Services  CM consult      Choice offered to / List presented to:  C-1 Patient           Status of service:  Completed, signed off Medicare Important Message given?   (If response is "NO", the following Medicare IM given date fields will be blank) Date Medicare IM given:   Medicare IM given by:   Date Additional Medicare IM given:   Additional Medicare IM given by:    Discharge Disposition:  [place]  Per UR Regulation:    If discussed at Long Length of Stay Meetings, dates discussed:    Comments:  12/07/2014 [place], RN, MSN, CM Pt now wishes to go to SNF. CSW is aware and has arranged for placement at [facility]. Pt and RN aware. Pt discharging today. [facility] has been notified of change in discharge plan. No further CM needs. 12/06/14 [place], RN BSN CM

## 2014-12-06 NOTE — Progress Notes (Signed)
OT Cancellation Note  Patient Details Name: Rick Cruz MRN: [id number] DOB: 1945-10-06   Cancelled Treatment:    Reason Eval/Treat Not Completed: Patient not medically ready. Chart reviewed and orthopedics has been consulted. Pt will probably have sx so I will remove him from OT caseload. Please send a new referral after sx if patient will be appropriate for occupational therapy.   Ailene Ravel, OTR/L,CBIS  [phone]  12/06/2014, 10:39 AM

## 2014-12-06 NOTE — Progress Notes (Signed)
Inpatient Diabetes Program Recommendations  AACE/ADA: New Consensus Statement on Inpatient Glycemic Control (2013)  Target Ranges:  Prepandial:   less than 140 mg/dL      Peak postprandial:   less than 180 mg/dL (1-2 hours)      Critically ill patients:  140 - 180 mg/dL   Results for Rick Cruz, Rick Cruz (MRN [id number]) as of 12/06/2014 13:17  Ref. Range 12/06/2014 08:06 12/06/2014 12:01  Glucose-Capillary Latest Range: 70-99 mg/dL 194 (H) 287 (H)   Noted that pt was on an insulin pump at home.  Pt states that his basal rate is 3-4 units/hour.  Based on his weight, please consider starting Lantus 30-40 units daily with moderate correction scale and meal coverage 6 units.  [place], CDE. M.Ed. Pager [phone] Inpatient Diabetes Coordinator

## 2014-12-06 NOTE — Progress Notes (Signed)
Patient OOB up in the chair. CAM walker in use to right lower leg.

## 2014-12-06 NOTE — Evaluation (Addendum)
Physical Therapy Evaluation Patient Details Name: Rick Cruz MRN: [id number] DOB: [birth year] Today's Date: 12/06/2014   History of Present Illness  Pt is a 69 year old male with a history of COPD, CHF, depression, schizophrenia, chronic neck and back pain and DM.  He has been falling frequently at home (he admits probably due to overuse of pain meds) and now has a fx of the right medial malleolus and distal fibula.  No surgery is planned.  Pt is extremely sedantary at home and is normally only able to ambulate 25' with a walker.  He spends his day in a lift chair.  His is on 3 L O2 chronically.  Clinical Impression  Pt was alert and cooperative for evaluation.  CAM walker was obtained and I fitted it to pt's right foot.  He has used them before, so is comfortable with its' use.  He has no significant pain in the left ankle.  Bed mobility is strenuous but he is able to get to EOB without assist.  He did need mod assist to transfer bed to [facility] but was able to stand to a walker and ambulate 6' with WBAT right and close guarding. I have recommended SNF at d/c but pt refuses   and feels that he will be able to manage at home.  He would benefit from having a BSC and w/c initially at home along with HHPT.    Follow Up Recommendations Home health PTor SNF    Equipment Recommendations  3in1 (PT);Wheelchair (measurements PT)    Recommendations for Other Services    none   Precautions / Restrictions Precautions Precautions: Fall Required Braces or Orthoses: Other Brace/Splint Other Brace/Splint: CAM walker on when out of bed Restrictions Weight Bearing Restrictions: No Other Position/Activity Restrictions: pt is WBAT right per MD order      Mobility  Bed Mobility Overal bed mobility: Needs Assistance Bed Mobility: Supine to Sit     Supine to sit: Supervision        Transfers Overall transfer level: Needs assistance Equipment used: Rolling walker (2 wheeled) Transfers: Sit to/from  [employer] Sit to Stand: Min assist   Squat pivot transfers: Mod assist        Ambulation/Gait Ambulation/Gait assistance: Min guard Ambulation Distance (Feet): 6 Feet Assistive device: Rolling walker (2 wheeled) Gait Pattern/deviations: Decreased step length - right Gait velocity: appropriate pace Gait velocity interpretation: Below normal speed for age/gender General Gait Details: stability with walker was fair to good  Stairs            Wheelchair Mobility    Modified Rankin (Stroke Patients Only)       Balance Overall balance assessment: Modified Independent                                           Pertinent Vitals/Pain Pain Assessment: No/denies pain    Home Living Family/patient expects to be discharged to:: Private residence Living Arrangements: Spouse/significant other;Children Available Help at Discharge: Family;Available 24 hours/day Type of Home: House Home Access: Ramped entrance     Home Layout: One level Home Equipment: Walker - 2 wheels;Cane - quad;Cane - single point;Shower seat Additional Comments: pt has a lift chair at home    Prior Function Level of Independence: Needs assistance   Gait / Transfers Assistance Needed: ambulates independently with a walker, sleeps in a lift chair  ADL's / Homemaking Assistance Needed: wife assists with a sponge bath        Hand Dominance        Extremity/Trunk Assessment   Upper Extremity Assessment: Generalized weakness           Lower Extremity Assessment: Generalized weakness         Communication   Communication: No difficulties  Cognition Arousal/Alertness: Awake/alert Behavior During Therapy: WFL for tasks assessed/performed Overall Cognitive Status: Within Functional Limits for tasks assessed                      General Comments      Exercises        Assessment/Plan    PT Assessment Patient needs continued PT services   PT Diagnosis Difficulty walking;Generalized weakness   PT Problem List Decreased strength;Decreased activity tolerance;Decreased mobility;Cardiopulmonary status limiting activity;Obesity;Pain  PT Treatment Interventions Gait training;Functional mobility training   PT Goals (Current goals can be found in the Care Plan section) Acute Rehab PT Goals Patient Stated Goal: none stated PT Goal Formulation: With patient Time For Goal Achievement: 12/13/14 Potential to Achieve Goals: Good    Frequency Min 5X/week   Barriers to discharge   none    Co-evaluation               End of Session Equipment Utilized During Treatment: Gait belt;Oxygen Activity Tolerance: Patient tolerated treatment well Patient left: in chair;with call bell/phone within reach;with chair alarm set           Time: 1437-1530 PT Time Calculation (min) (ACUTE ONLY): 53 min   Charges:   PT Evaluation $Initial PT Evaluation Tier I: 1 Procedure PT Treatments $Therapeutic Activity: 8-22 mins   PT G CodesSable Feil 12/06/2014, 3:53 PM

## 2014-12-06 NOTE — Discharge Summary (Addendum)
Physician Discharge Summary  Rick Cruz [id number] DOB: 1946/01/02 DOA: 12/05/2014  PCP: [name], MD  Admit date: 12/05/2014 Discharge date: 12/07/2014  Time spent: 20 minutes  Recommendations for Outpatient Follow-up:  1. Follow up with PCP in 1-2 weeks 2. Follow up with Orthopedic Surgery 2 weeks 3. Uses 3LNC at baseline  Discharge Diagnoses:  Active Problems:   Obstructive sleep apnea   Essential hypertension   DM (diabetes mellitus)   Recurrent falls   Ankle fracture, right   COPD (chronic obstructive pulmonary disease)   Discharge Condition: Stable  Diet recommendation: Diabetic  Filed Weights   12/05/14 1858  Weight: 142.203 kg (313 lb 8 oz)    History of present illness:  Please review h and p from 3/20 for details. Briefly, pt presented with recurrent falls while on narcotics.  Hospital Course:  1. Recurrent Falls 1. Suspect secondary to polypharmacy involving multiple sedating meds 2. Patient has admitted to unintentionally overdosing his pain pills and benzos. Denies suicidal ideation 3. Advised patient to take medications only as directed 4. Would stop fentanyl patch for now 5. Seen by PT. Patient refused SNF initially with plans for home health and PT with 3 in 1 wheelchair 6. After discharge order signed, pt then agreed to SNF. D/w SW with plans to skilled nursing 2. New R medial Malleolar and Proximal Fibula Fx 1. Seen by Orthopedic Surgery 2. No plans for surgery 3. Recs for CAM walker and follow up in 2 weeks with Orthopedic surgery 4. Per above, PT recs for home health PT with 3 in 1 wheelchair 3. DM2 1. Glucose poorly controlled 2. Pt normally uses insulin pump, did not bring to hospital 3. Will follow Diabetic Coordinator recs for Lantus 30 units with 6 units of pre meal coverage and SSI coverage 4. HTN 1. BP stable, controlled 5. COPD 1. Stable 2. On min O2 support 3. Normally uses 3LNC at baseline 6. OSA 1. Stable 7. DVT  prophylaxis 1. Heparin subQ  Consultations:  Orthopedic Surgery  Discharge Exam: Filed Vitals:   12/06/14 2142 12/07/14 0410 12/07/14 0522 12/07/14 0736  BP: 122/61 122/60    Pulse: 75 71    Temp: 97.7 F (36.5 C) 97.8 F (36.6 C)    TempSrc: Oral Oral    Resp: 20 20    Height:      Weight:      SpO2: 95% 93% 95% 90%    General: Awake, in nad Cardiovascular: regular, s1, s2 Respiratory: normal resp effort, no wheezing  Discharge Instructions     Medication List    STOP taking these medications        fentaNYL 50 MCG/HR  Commonly known as:  DURAGESIC - dosed mcg/hr      TAKE these medications        albuterol 108 (90 BASE) MCG/ACT inhaler  Commonly known as:  PROVENTIL HFA;VENTOLIN HFA  Inhale 2 puffs into the lungs every 6 (six) hours as needed for wheezing or shortness of breath.     albuterol (2.5 MG/3ML) 0.083% nebulizer solution  Commonly known as:  PROVENTIL  Take 3 mLs (2.5 mg total) by nebulization every 6 (six) hours as needed for wheezing or shortness of breath.     aspirin 81 MG chewable tablet  Chew 81 mg by mouth every morning.     brimonidine 0.15 % ophthalmic solution  Commonly known as:  ALPHAGAN  Place 1 drop into the right eye 2 (two) times daily.  Cinnamon 500 MG capsule  Take 1,000 mg by mouth every morning.     citalopram 20 MG tablet  Commonly known as:  CELEXA  Take 20 mg by mouth every morning.     clopidogrel 75 MG tablet  Commonly known as:  PLAVIX  Take 75 mg by mouth every morning.     furosemide 40 MG tablet  Commonly known as:  LASIX  Take 1 tablet (40 mg total) by mouth daily.     gabapentin 300 MG capsule  Commonly known as:  NEURONTIN  Take 300 mg by mouth 3 (three) times daily.     guaiFENesin 600 MG 12 hr tablet  Commonly known as:  MUCINEX  Take 1 tablet (600 mg total) by mouth 2 (two) times daily.     HYDROcodone-acetaminophen 5-325 MG per tablet  Commonly known as:  NORCO/VICODIN  Take 1 tablet by  mouth every 6 (six) hours as needed for moderate pain.     ibuprofen 200 MG tablet  Commonly known as:  ADVIL,MOTRIN  Take 200 mg by mouth every 4 (four) hours as needed for moderate pain.     insulin pump Soln  Inject into the skin continuous. Humalog     LORazepam 1 MG tablet  Commonly known as:  ATIVAN  Take 0.5 tablets (0.5 mg total) by mouth 2 (two) times daily as needed for anxiety.     metoprolol 50 MG tablet  Commonly known as:  LOPRESSOR  Take 50 mg by mouth 2 (two) times daily.     pantoprazole 40 MG tablet  Commonly known as:  PROTONIX  Take 40 mg by mouth 2 (two) times daily.     predniSONE 10 MG tablet  Commonly known as:  DELTASONE  Take 40mg  po daily for 2 days then 30mg  po daily for 2 days then 20mg  po daily for 2 days then 10mg  po daily for 2 days then stop     simvastatin 40 MG tablet  Commonly known as:  ZOCOR  Take 40 mg by mouth every evening.     terazosin 1 MG capsule  Commonly known as:  HYTRIN  Take 1 mg by mouth at bedtime.     tiotropium 18 MCG inhalation capsule  Commonly known as:  SPIRIVA HANDIHALER  Place 1 capsule (18 mcg total) into inhaler and inhale every morning.     traZODone 150 MG tablet  Commonly known as:  DESYREL  Take 300 mg by mouth at bedtime.     triamcinolone cream 0.1 %  Commonly known as:  KENALOG  Apply 1 application topically 2 (two) times daily as needed (for irritation).     ziprasidone 80 MG capsule  Commonly known as:  GEODON  Take 80 mg by mouth every evening.       Allergies  Allergen Reactions  . Phenergan [Promethazine Hcl] Other (See Comments)    Becomes very confused and aggressive  . Haldol [Haloperidol Lactate] Other (See Comments)    Shaking    Follow-up Information    Follow up with [facility].   Contact information:   [address] [place] [address] [phone]       Follow up with Sanjuana Kava, MD. Schedule an appointment as soon as possible for a visit  in 2 weeks.   Specialty:  Orthopedic Surgery   Why:  hospital follow up   Contact information:   [place] [address] [phone]  Follow up with [name], MD. Schedule an appointment as soon as possible for a visit in 1 week.   Specialty:  Internal Medicine   Why:  hospital follow up   Contact information:   [address] [place] [address] [phone]        The results of significant diagnostics from this hospitalization (including imaging, microbiology, ancillary and laboratory) are listed below for reference.    Significant Diagnostic Studies: Dg Chest 1 View  12/05/2014   CLINICAL DATA:  Status post fall  EXAM: CHEST  1 VIEW  COMPARISON:  08/04/2014  FINDINGS: Lungs are essentially clear. No frank interstitial edema. No pleural effusion or pneumothorax.  Mild cardiomegaly.  IMPRESSION: No evidence of acute cardiopulmonary disease.   Electronically Signed   By: Julian Hy M.D.   On: 12/05/2014 15:20   Dg Thoracic Spine 2 View  12/05/2014   CLINICAL DATA:  Status post fall  EXAM: THORACIC SPINE - 2 VIEW  COMPARISON:  None.  FINDINGS: Normal thoracic kyphosis.  No evidence of fracture or dislocation. Vertebral body heights and intervertebral disc spaces are maintained.  Mild degenerative changes of the mid thoracic spine.  Visualized lungs are clear.  IMPRESSION: No fracture or dislocation is seen.   Electronically Signed   By: Julian Hy M.D.   On: 12/05/2014 15:21   Dg Lumbar Spine Complete  12/05/2014   CLINICAL DATA:  Recent falls, all  EXAM: LUMBAR SPINE - COMPLETE 4+ VIEW  COMPARISON:  MR lumbar spine 06/18/2013  FINDINGS: Normal alignment of lumbar vertebral bodies. No loss vertebral body height and disc height. Minimal endplate spurring. No pars fracture.  IMPRESSION: No acute findings lumbar spine.  No change comparison MRI.   Electronically Signed   By: Suzy Bouchard M.D.   On: 12/05/2014 15:22   Dg Pelvis  1-2 Views  12/05/2014   CLINICAL DATA:  Status post fall, right lower leg pain  EXAM: PELVIS - 1-2 VIEW  COMPARISON:  None.  FINDINGS: No fracture or dislocation is seen.  Bilateral hip joint spaces are preserved.  Visualized bony pelvis appears intact.  IMPRESSION: No fracture or dislocation is seen.   Electronically Signed   By: Julian Hy M.D.   On: 12/05/2014 15:20   Dg Tibia/fibula Right  12/05/2014   CLINICAL DATA:  Multiple falls with lower leg pain, initial encounter  EXAM: RIGHT TIBIA AND FIBULA - 2 VIEW  COMPARISON:  05/08/2014  FINDINGS: There is an undisplaced fracture in the proximal fibular diaphysis identified. Additionally, the previously seen fracture is again identified with callus formation. The proximal fibular fracture shows no significant callus formation is likely acute in nature. Additionally there is a lucency along the medial malleolus consistent with acute fracture.  IMPRESSION: New medial malleolar fracture and proximal fibular fracture with healing old distal fibular fracture.   Electronically Signed   By: Inez Catalina M.D.   On: 12/05/2014 15:22   Ct Head Wo Contrast  12/05/2014   CLINICAL DATA:  Fall, pt states he fell out of his recliner today. Ems reports patient has had multiple falls over last 4 days. Pt states he did not hilt his head but is sore all over./  EXAM: CT HEAD WITHOUT CONTRAST  CT CERVICAL SPINE WITHOUT CONTRAST  TECHNIQUE: Multidetector CT imaging of the head and cervical spine was performed following the standard protocol without intravenous contrast. Multiplanar CT image reconstructions of the cervical spine were also generated.  COMPARISON:  Head CT,  05/08/2014  FINDINGS: CT HEAD FINDINGS  Ventricles are normal in configuration. There is ventricular and sulcal enlargement reflecting mild atrophy. No parenchymal masses or mass effect. There is no evidence of a cortical infarct. Patchy white matter hypoattenuation is noted consistent mild to moderate  chronic microvascular ischemic change.  No extra-axial masses or abnormal fluid collections.  No intracranial hemorrhage.  Visualized sinuses essentially clear. Clear mastoid air cells. No skull fracture.  CT CERVICAL SPINE FINDINGS  No fracture or spondylolisthesis. Reversal of the normal cervical lordosis. There is loss of disc height with endplate spurring most evident at C4-C5, C5-C6 and C6-C7. Facet degenerative changes noted bilaterally in the upper cervical spine. There is neural foraminal narrowing at multiple levels, greatest on the left at C3-C4, moderate-to-severe from facet and uncovertebral spurring, and on the right from uncovertebral facet spurring at C4-C5, severe in degree.  No significant soft tissue abnormality.  Lung apices are clear.  IMPRESSION: HEAD CT: No acute intracranial abnormalities. Mild atrophy and mild to moderate chronic microvascular ischemic change.  CERVICAL CT:  No fracture or acute finding.   Electronically Signed   By: Lajean Manes M.D.   On: 12/05/2014 14:36   Ct Cervical Spine Wo Contrast  12/05/2014   CLINICAL DATA:  Fall, pt states he fell out of his recliner today. Ems reports patient has had multiple falls over last 4 days. Pt states he did not hilt his head but is sore all over./  EXAM: CT HEAD WITHOUT CONTRAST  CT CERVICAL SPINE WITHOUT CONTRAST  TECHNIQUE: Multidetector CT imaging of the head and cervical spine was performed following the standard protocol without intravenous contrast. Multiplanar CT image reconstructions of the cervical spine were also generated.  COMPARISON:  Head CT, 05/08/2014  FINDINGS: CT HEAD FINDINGS  Ventricles are normal in configuration. There is ventricular and sulcal enlargement reflecting mild atrophy. No parenchymal masses or mass effect. There is no evidence of a cortical infarct. Patchy white matter hypoattenuation is noted consistent mild to moderate chronic microvascular ischemic change.  No extra-axial masses or abnormal fluid  collections.  No intracranial hemorrhage.  Visualized sinuses essentially clear. Clear mastoid air cells. No skull fracture.  CT CERVICAL SPINE FINDINGS  No fracture or spondylolisthesis. Reversal of the normal cervical lordosis. There is loss of disc height with endplate spurring most evident at C4-C5, C5-C6 and C6-C7. Facet degenerative changes noted bilaterally in the upper cervical spine. There is neural foraminal narrowing at multiple levels, greatest on the left at C3-C4, moderate-to-severe from facet and uncovertebral spurring, and on the right from uncovertebral facet spurring at C4-C5, severe in degree.  No significant soft tissue abnormality.  Lung apices are clear.  IMPRESSION: HEAD CT: No acute intracranial abnormalities. Mild atrophy and mild to moderate chronic microvascular ischemic change.  CERVICAL CT:  No fracture or acute finding.   Electronically Signed   By: Lajean Manes M.D.   On: 12/05/2014 14:36    Microbiology: No results found for this or any previous visit (from the past 240 hour(s)).   Labs: Basic Metabolic Panel:  Recent Labs Lab 12/05/14 1252 12/06/14 0515  NA 135 139  K 5.3* 4.5  CL 93* 98  CO2 33* 36*  GLUCOSE 311* 199*  BUN 15 10  CREATININE 1.31 0.95  CALCIUM 8.8 8.4   Liver Function Tests:  Recent Labs Lab 12/06/14 0515  AST 15  ALT 11  ALKPHOS 70  BILITOT 0.7  PROT 5.7*  ALBUMIN 3.2*   No results for  input(s): LIPASE, AMYLASE in the last 168 hours. No results for input(s): AMMONIA in the last 168 hours. CBC:  Recent Labs Lab 12/05/14 1252 12/06/14 0515  WBC 13.1* 7.8  NEUTROABS 11.5*  --   HGB 11.5* 10.8*  HCT 39.4 37.2*  MCV 91.2 92.5  PLT 174 146*   Cardiac Enzymes:  Recent Labs Lab 12/05/14 1252 12/06/14 0515  CKTOTAL 535* 347*  TROPONINI 0.03  --    BNP: BNP (last 3 results) No results for input(s): BNP in the last 8760 hours.  ProBNP (last 3 results)  Recent Labs  07/14/14 1830 07/21/14 0143 08/04/14 1525   PROBNP 211.1* 99.9 277.3*    CBG:  Recent Labs Lab 12/06/14 0806 12/06/14 1201 12/06/14 1643 12/06/14 2140 12/07/14 0753  GLUCAP 194* 287* 241* 159* 150*    Signed:  Madell Heino K  [facility] 12/07/2014, 10:47 AM

## 2014-12-07 LAB — GLUCOSE, CAPILLARY
GLUCOSE-CAPILLARY: 202 mg/dL — AB (ref 70–99)
Glucose-Capillary: 150 mg/dL — ABNORMAL HIGH (ref 70–99)

## 2014-12-07 NOTE — Clinical Social Work Psychosocial (Signed)
Clinical Social Work Department BRIEF PSYCHOSOCIAL ASSESSMENT 12/07/2014  Patient:  Rick Cruz, Rick Cruz     Account Number:  [id number]     Admit date:  12/05/2014  Clinical Social Worker:  Legrand Como  Date/Time:  12/07/2014 10:37 AM  Referred by:  CSW  Date Referred:  12/07/2014 Referred for  SNF Placement   Other Referral:   Interview type:  Patient Other interview type:    PSYCHOSOCIAL DATA Living Status:  WIFE Admitted from facility:   Level of care:   Primary support name:  Rick Cruz Primary support relationship to patient:  SPOUSE Degree of support available:   Patient reports that his wife is support.    CURRENT CONCERNS Current Concerns  Post-Acute Placement   Other Concerns:    SOCIAL WORK ASSESSMENT / PLAN CSW met with patient who was alert and oriented.  Patient stated that she lives in the home with his wife.  He stated that at baseline she ambulates with a walker and does not require assistance with his ADLs.  CSW discussed the SNF recommendation with patient.  CSW explored patient's feelings related to the placement.  Patient stated "I really don't want to go" referring to SNF placement.  He indicated that he would go.  CSW encouraged patient to realize that his rehab will be short term and that he is going home stronger and in a better condition upon completion of rehab.  Patient was agreeable.  CSW provided patient a SNF list.  Patient indicated that he had been to a SNF last year after breaking both of his ankles when he fell backwards.  He indicated that he last year he went to [facility] in [place]. He stated that he did not desire to go back to that facility.  Patient indicated that he desired to go to [facility], [facility] or [facility]. Patient gave CSW permission to send clinicals to [facility], [facility] and [facility].   Assessment/plan status:  Information/Referral to [employer] Other assessment/ plan:   Information/referral to  community resources:    PATIENT'S/FAMILY'S RESPONSE TO PLAN OF CARE: While patient would prefer to go home, he is agreeable to go to SNF.    Ambrose Pancoast, [place]

## 2014-12-07 NOTE — Care Management Utilization Note (Signed)
UR completed 

## 2014-12-07 NOTE — Progress Notes (Signed)
[facility] PROGRESS NOTE  Rick Cruz [id number] DOB: Sep 24, 1945 DOA: 12/05/2014 PCP: [name], MD  Assessment/Plan: 1. Recurrent Falls 1. Suspect secondary to polypharmacy involving multiple sedating meds 2. Patient has admitted to unintentionally overdosing his pain pills and benzos. Denies suicidal ideation 3. Advised patient to take medications only as directed 4. Would stop fentanyl patch for now 5. Seen by PT. Patient refused SNF initially with plans for home health and PT with 3 in 1 wheelchair 6. After discharge order signed, pt then agreed to SNF. D/w SW with plans to skilled nursing 2. New R medial Malleolar and Proximal Fibula Fx 1. Seen by Orthopedic Surgery 2. No plans for surgery 3. Recs for CAM walker and follow up in 2 weeks with Orthopedic surgery 4. Per above, PT recs for home health PT with 3 in 1 wheelchair 3. DM2 1. Glucose poorly controlled 2. Pt normally uses insulin pump, did not bring to hospital 3. Will follow Diabetic Coordinator recs for Lantus 30 units with 6 units of pre meal coverage and SSI coverage 4. HTN 1. BP stable, controlled 5. COPD 1. Stable 2. On min O2 support 6. OSA 1. Stable 7. DVT prophylaxis 1. Heparin subQ  Code Status: Full Family Communication: Pt in room (indicate person spoken with, relationship, and if by phone, the number) Disposition Plan: SNF today   Consultants:  Orthopedic surgery  Procedures:    Antibiotics:   (indicate start date, and stop date if known)  HPI/Subjective: No acute events noted overnight  Objective: Filed Vitals:   12/06/14 2142 12/07/14 0410 12/07/14 0522 12/07/14 0736  BP: 122/61 122/60    Pulse: 75 71    Temp: 97.7 F (36.5 C) 97.8 F (36.6 C)    TempSrc: Oral Oral    Resp: 20 20    Height:      Weight:      SpO2: 95% 93% 95% 90%    Intake/Output Summary (Last 24 hours) at 12/07/14 1013 Last data filed at 12/07/14 0356  Gross per 24 hour  Intake   1835 ml   Output   1925 ml  Net    -90 ml   Filed Weights   12/05/14 1858  Weight: 142.203 kg (313 lb 8 oz)    Exam:   General:  Awake, in nad  Cardiovascular: regular, s1, s2  Respiratory: normal resp effort, no wheezing  Abdomen: soft,nondistended, obese  Musculoskeletal: perfused, no clubbing   Data Reviewed: Basic Metabolic Panel:  Recent Labs Lab 12/05/14 1252 12/06/14 0515  NA 135 139  K 5.3* 4.5  CL 93* 98  CO2 33* 36*  GLUCOSE 311* 199*  BUN 15 10  CREATININE 1.31 0.95  CALCIUM 8.8 8.4   Liver Function Tests:  Recent Labs Lab 12/06/14 0515  AST 15  ALT 11  ALKPHOS 70  BILITOT 0.7  PROT 5.7*  ALBUMIN 3.2*   No results for input(s): LIPASE, AMYLASE in the last 168 hours. No results for input(s): AMMONIA in the last 168 hours. CBC:  Recent Labs Lab 12/05/14 1252 12/06/14 0515  WBC 13.1* 7.8  NEUTROABS 11.5*  --   HGB 11.5* 10.8*  HCT 39.4 37.2*  MCV 91.2 92.5  PLT 174 146*   Cardiac Enzymes:  Recent Labs Lab 12/05/14 1252 12/06/14 0515  CKTOTAL 535* 347*  TROPONINI 0.03  --    BNP (last 3 results) No results for input(s): BNP in the last 8760 hours.  ProBNP (last 3 results)  Recent Labs  07/14/14 1830 07/21/14 0143 08/04/14 1525  PROBNP 211.1* 99.9 277.3*    CBG:  Recent Labs Lab 12/06/14 0806 12/06/14 1201 12/06/14 1643 12/06/14 2140 12/07/14 0753  GLUCAP 194* 287* 241* 159* 150*    No results found for this or any previous visit (from the past 240 hour(s)).   Studies: Dg Chest 1 View  12/05/2014   CLINICAL DATA:  Status post fall  EXAM: CHEST  1 VIEW  COMPARISON:  08/04/2014  FINDINGS: Lungs are essentially clear. No frank interstitial edema. No pleural effusion or pneumothorax.  Mild cardiomegaly.  IMPRESSION: No evidence of acute cardiopulmonary disease.   Electronically Signed   By: Julian Hy M.D.   On: 12/05/2014 15:20   Dg Thoracic Spine 2 View  12/05/2014   CLINICAL DATA:  Status post fall  EXAM:  THORACIC SPINE - 2 VIEW  COMPARISON:  None.  FINDINGS: Normal thoracic kyphosis.  No evidence of fracture or dislocation. Vertebral body heights and intervertebral disc spaces are maintained.  Mild degenerative changes of the mid thoracic spine.  Visualized lungs are clear.  IMPRESSION: No fracture or dislocation is seen.   Electronically Signed   By: Julian Hy M.D.   On: 12/05/2014 15:21   Dg Lumbar Spine Complete  12/05/2014   CLINICAL DATA:  Recent falls, all  EXAM: LUMBAR SPINE - COMPLETE 4+ VIEW  COMPARISON:  MR lumbar spine 06/18/2013  FINDINGS: Normal alignment of lumbar vertebral bodies. No loss vertebral body height and disc height. Minimal endplate spurring. No pars fracture.  IMPRESSION: No acute findings lumbar spine.  No change comparison MRI.   Electronically Signed   By: Suzy Bouchard M.D.   On: 12/05/2014 15:22   Dg Pelvis 1-2 Views  12/05/2014   CLINICAL DATA:  Status post fall, right lower leg pain  EXAM: PELVIS - 1-2 VIEW  COMPARISON:  None.  FINDINGS: No fracture or dislocation is seen.  Bilateral hip joint spaces are preserved.  Visualized bony pelvis appears intact.  IMPRESSION: No fracture or dislocation is seen.   Electronically Signed   By: Julian Hy M.D.   On: 12/05/2014 15:20   Dg Tibia/fibula Right  12/05/2014   CLINICAL DATA:  Multiple falls with lower leg pain, initial encounter  EXAM: RIGHT TIBIA AND FIBULA - 2 VIEW  COMPARISON:  05/08/2014  FINDINGS: There is an undisplaced fracture in the proximal fibular diaphysis identified. Additionally, the previously seen fracture is again identified with callus formation. The proximal fibular fracture shows no significant callus formation is likely acute in nature. Additionally there is a lucency along the medial malleolus consistent with acute fracture.  IMPRESSION: New medial malleolar fracture and proximal fibular fracture with healing old distal fibular fracture.   Electronically Signed   By: Inez Catalina M.D.    On: 12/05/2014 15:22   Ct Head Wo Contrast  12/05/2014   CLINICAL DATA:  Fall, pt states he fell out of his recliner today. Ems reports patient has had multiple falls over last 4 days. Pt states he did not hilt his head but is sore all over./  EXAM: CT HEAD WITHOUT CONTRAST  CT CERVICAL SPINE WITHOUT CONTRAST  TECHNIQUE: Multidetector CT imaging of the head and cervical spine was performed following the standard protocol without intravenous contrast. Multiplanar CT image reconstructions of the cervical spine were also generated.  COMPARISON:  Head CT, 05/08/2014  FINDINGS: CT HEAD FINDINGS  Ventricles are normal in configuration. There is ventricular and sulcal enlargement reflecting mild atrophy. No  parenchymal masses or mass effect. There is no evidence of a cortical infarct. Patchy white matter hypoattenuation is noted consistent mild to moderate chronic microvascular ischemic change.  No extra-axial masses or abnormal fluid collections.  No intracranial hemorrhage.  Visualized sinuses essentially clear. Clear mastoid air cells. No skull fracture.  CT CERVICAL SPINE FINDINGS  No fracture or spondylolisthesis. Reversal of the normal cervical lordosis. There is loss of disc height with endplate spurring most evident at C4-C5, C5-C6 and C6-C7. Facet degenerative changes noted bilaterally in the upper cervical spine. There is neural foraminal narrowing at multiple levels, greatest on the left at C3-C4, moderate-to-severe from facet and uncovertebral spurring, and on the right from uncovertebral facet spurring at C4-C5, severe in degree.  No significant soft tissue abnormality.  Lung apices are clear.  IMPRESSION: HEAD CT: No acute intracranial abnormalities. Mild atrophy and mild to moderate chronic microvascular ischemic change.  CERVICAL CT:  No fracture or acute finding.   Electronically Signed   By: Lajean Manes M.D.   On: 12/05/2014 14:36   Ct Cervical Spine Wo Contrast  12/05/2014   CLINICAL DATA:  Fall,  pt states he fell out of his recliner today. Ems reports patient has had multiple falls over last 4 days. Pt states he did not hilt his head but is sore all over./  EXAM: CT HEAD WITHOUT CONTRAST  CT CERVICAL SPINE WITHOUT CONTRAST  TECHNIQUE: Multidetector CT imaging of the head and cervical spine was performed following the standard protocol without intravenous contrast. Multiplanar CT image reconstructions of the cervical spine were also generated.  COMPARISON:  Head CT, 05/08/2014  FINDINGS: CT HEAD FINDINGS  Ventricles are normal in configuration. There is ventricular and sulcal enlargement reflecting mild atrophy. No parenchymal masses or mass effect. There is no evidence of a cortical infarct. Patchy white matter hypoattenuation is noted consistent mild to moderate chronic microvascular ischemic change.  No extra-axial masses or abnormal fluid collections.  No intracranial hemorrhage.  Visualized sinuses essentially clear. Clear mastoid air cells. No skull fracture.  CT CERVICAL SPINE FINDINGS  No fracture or spondylolisthesis. Reversal of the normal cervical lordosis. There is loss of disc height with endplate spurring most evident at C4-C5, C5-C6 and C6-C7. Facet degenerative changes noted bilaterally in the upper cervical spine. There is neural foraminal narrowing at multiple levels, greatest on the left at C3-C4, moderate-to-severe from facet and uncovertebral spurring, and on the right from uncovertebral facet spurring at C4-C5, severe in degree.  No significant soft tissue abnormality.  Lung apices are clear.  IMPRESSION: HEAD CT: No acute intracranial abnormalities. Mild atrophy and mild to moderate chronic microvascular ischemic change.  CERVICAL CT:  No fracture or acute finding.   Electronically Signed   By: Lajean Manes M.D.   On: 12/05/2014 14:36    Scheduled Meds: . aspirin  81 mg Oral q morning - 10a  . brimonidine  1 drop Right Eye BID  . citalopram  20 mg Oral q morning - 10a  .  clopidogrel  75 mg Oral q morning - 10a  . furosemide  40 mg Oral Daily  . gabapentin  300 mg Oral TID  . guaiFENesin  600 mg Oral BID  . heparin  5,000 Units Subcutaneous 3 times per day  . insulin aspart  0-15 Units Subcutaneous TID WC  . insulin aspart  0-5 Units Subcutaneous QHS  . insulin aspart  6 Units Subcutaneous TID WC  . insulin glargine  30 Units Subcutaneous Daily  .  metoprolol  50 mg Oral BID  . pantoprazole  40 mg Oral BID  . simvastatin  40 mg Oral QPM  . terazosin  1 mg Oral QHS  . tiotropium  18 mcg Inhalation q morning - 10a  . traZODone  150 mg Oral QHS  . ziprasidone  80 mg Oral QPM   Continuous Infusions: . sodium chloride 75 mL/hr (12/07/14 [id number])    Active Problems:   Obstructive sleep apnea   Essential hypertension   DM (diabetes mellitus)   Recurrent falls   Ankle fracture, right   COPD (chronic obstructive pulmonary disease)  Jahmil Macleod, [facility] Pager [phone]. If 7PM-7AM, please contact night-coverage at [email or web address], password [facility] 12/07/2014, 10:13 AM  LOS: 2 days

## 2014-12-07 NOTE — Progress Notes (Signed)
Patient states he is not going home he is going to rehab for a short period. Informed Dr. Wyline Copas . Discharged home cancelled.

## 2014-12-07 NOTE — Clinical Social Work Placement (Signed)
Clinical Social Work Department CLINICAL SOCIAL WORK PLACEMENT NOTE 12/07/2014  Patient:  Rick Cruz, Rick Cruz  Account Number:  [id number] Admit date:  12/05/2014  Clinical Social Worker:  Ambrose Pancoast, LCSW  Date/time:  12/07/2014 11:03 AM  Clinical Social Work is seeking post-discharge placement for this patient at the following level of care:   [place]   (*CSW will update this form in Epic as items are completed)   12/07/2014  Patient/family provided with [place] Department of Clinical Social Work's list of facilities offering this level of care within the geographic area requested by the patient (or if unable, by the patient's family).  12/07/2014  Patient/family informed of their freedom to choose among providers that offer the needed level of care, that participate in Medicare, Medicaid or managed care program needed by the patient, have an available bed and are willing to accept the patient.  12/07/2014  Patient/family informed of [facility]' ownership interest in [facility], as well as of the fact that they are under no obligation to receive care at this facility.  PASARR submitted to EDS on 12/07/2014 PASARR number received on   FL2 transmitted to all facilities in geographic area requested by pt/family on  12/07/2014 FL2 transmitted to all facilities within larger geographic area on   Patient informed that his/her managed care company has contracts with or will negotiate with  certain facilities, including the following:     Patient/family informed of bed offers received:  12/07/2014 Patient chooses bed at  Physician recommends and patient chooses bed at    Patient to be transferred to  on   Patient to be transferred to facility by  Patient and family notified of transfer on  Name of family member notified:    The following physician request were entered in Epic:   Additional Comments:   Ambrose Pancoast, [place]

## 2014-12-07 NOTE — Progress Notes (Signed)
PT Cancellation Note  Patient Details Name: Rick Cruz MRN: [id number] DOB: 04-10-1946   Cancelled Treatment:    Reason Eval/Treat Not Completed: Patient declined, no reason specified.  Pt does state that he now is planning to go to SNF.   Sable Feil 12/07/2014, 9:13 AM

## 2014-12-07 NOTE — Progress Notes (Signed)
Patient transported to [facility] via [employer]. IV removed. Mother at bedside. Report called to [facility].

## 2014-12-07 NOTE — Progress Notes (Signed)
Physical Therapy Treatment Patient Details Name: Rick Cruz MRN: [id number] DOB: 04/22/1946 Today's Date: 12/07/2014    History of Present Illness Pt is a 69 year old male with a history of COPD, CHF, depression, schizophrenia, chronic neck and back pain and DM.  He has been falling frequently at home (he admits probably due to overuse of pain meds) and now has a fx of the right medial malleolus and distal fibula.  No surgery is planned.  Pt is extremely sedantary at home and is normally only able to ambulate 25' with a walker.  He spends his day in a lift chair.  His is on 3 L O2 chronically.    PT Comments    Pt now feels that he will need to go to SNF at d/c.  Because of his significant deconditioning and very high fall risk this is a very good decision.  Today, he was instructed in transfer to EOB, needing no physical assist if [facility] was fully elevated.  He was instructed in the use of the CAM walker whenever weight bearing and it was placed on his right foot.  He was instructed in gait with walker, WBAT right.  He ambulated 81' with close guarding and no loss of balance.  He was instructed to stay closer to the walker but was unable to do this.  He was exhausted by gait.  Follow Up Recommendations  SNF     Equipment Recommendations    none if pt goes to SNF   Recommendations for Other Services  none     Precautions / Restrictions Precautions Precautions: Fall Required Braces or Orthoses: Other Brace/Splint Other Brace/Splint: CAM walker on when out of bed Restrictions Weight Bearing Restrictions: No Other Position/Activity Restrictions: pt is WBAT right per MD order    Mobility  Bed Mobility Overal bed mobility: Modified Independent Bed Mobility: Supine to Sit     Supine to sit: HOB elevated     General bed mobility comments: no assistance needed if [facility] is fully elevated  Transfers Overall transfer level: Needs assistance Equipment used: Rolling walker (2  wheeled) Transfers: Sit to/from Stand Sit to Stand: Min guard;From elevated surface            Ambulation/Gait Ambulation/Gait assistance: Min guard Ambulation Distance (Feet): 12 Feet Assistive device: Rolling walker (2 wheeled) Gait Pattern/deviations: Decreased step length - right;Trunk flexed Gait velocity: appropriate pace Gait velocity interpretation: Below normal speed for age/gender General Gait Details: pt holds walker too far forward but is unable to maintain correct position   Stairs            Wheelchair Mobility    Modified Rankin (Stroke Patients Only)       Balance                                    Cognition Arousal/Alertness: Awake/alert Behavior During Therapy: WFL for tasks assessed/performed Overall Cognitive Status: Within Functional Limits for tasks assessed                      Exercises      General Comments        Pertinent Vitals/Pain Pain Assessment: No/denies pain    Home Living                      Prior Function  PT Goals (current goals can now be found in the care plan section) Progress towards PT goals: Progressing toward goals    Frequency  Min 5X/week    PT Plan Discharge plan needs to be updated    Co-evaluation             End of Session Equipment Utilized During Treatment: Gait belt;Oxygen Activity Tolerance: Patient tolerated treatment well Patient left: in chair;with call bell/phone within reach;with chair alarm set     Time: 1003-1030 PT Time Calculation (min) (ACUTE ONLY): 27 min  Charges:  $Gait Training: 8-22 mins                    G Codes:      Sable Feil 12/25/14, 10:34 AM

## 2014-12-08 ENCOUNTER — Ambulatory Visit (INDEPENDENT_AMBULATORY_CARE_PROVIDER_SITE_OTHER): Payer: Medicare Other | Admitting: Ophthalmology

## 2014-12-08 NOTE — Clinical Social Work Placement (Signed)
Late entry for 12/07/14  Clinical Social Work Department CLINICAL SOCIAL WORK PLACEMENT NOTE 12/08/2014  Patient:  Rick Cruz, Rick Cruz  Account Number:  [id number] Admit date:  12/05/2014  Clinical Social Worker:  Ambrose Pancoast, LCSW  Date/time:  12/07/2014 11:03 AM  Clinical Social Work is seeking post-discharge placement for this patient at the following level of care:   [place]   (*CSW will update this form in Epic as items are completed)   12/07/2014  Patient/family provided with [place] Department of Clinical Social Work's list of facilities offering this level of care within the geographic area requested by the patient (or if unable, by the patient's family).  12/07/2014  Patient/family informed of their freedom to choose among providers that offer the needed level of care, that participate in Medicare, Medicaid or managed care program needed by the patient, have an available bed and are willing to accept the patient.  12/07/2014  Patient/family informed of [facility]' ownership interest in [facility], as well as of the fact that they are under no obligation to receive care at this facility.  PASARR submitted to EDS on 12/07/2014 PASARR number received on   FL2 transmitted to all facilities in geographic area requested by pt/family on  12/07/2014 FL2 transmitted to all facilities within larger geographic area on   Patient informed that his/her managed care company has contracts with or will negotiate with  certain facilities, including the following:     Patient/family informed of bed offers received:  12/07/2014 Patient chooses bed at [place] Physician recommends and patient chooses bed at    Patient to be transferred to [place] on  12/07/2014 Patient to be transferred to facility by [employer] Patient and family notified of transfer on 12/07/2014 Name of family member notified:  Jacqulyn Liner  The  following physician request were entered in Epic:   Additional Comments:  Ambrose Pancoast, [place]

## 2014-12-23 ENCOUNTER — Other Ambulatory Visit (HOSPITAL_COMMUNITY): Payer: Self-pay | Admitting: Orthopaedic Surgery

## 2014-12-23 ENCOUNTER — Ambulatory Visit (HOSPITAL_COMMUNITY)
Admission: RE | Admit: 2014-12-23 | Discharge: 2014-12-23 | Disposition: A | Discharge status: Home / Self Care | Payer: Medicare Other | Source: Ambulatory Visit | Attending: Orthopaedic Surgery | Admitting: Orthopaedic Surgery

## 2014-12-23 DIAGNOSIS — S8251XD Displaced fracture of medial malleolus of right tibia, subsequent encounter for closed fracture with routine healing: Secondary | ICD-10-CM | POA: Diagnosis not present

## 2014-12-23 DIAGNOSIS — T148XXA Other injury of unspecified body region, initial encounter: Secondary | ICD-10-CM

## 2014-12-23 DIAGNOSIS — S82831D Other fracture of upper and lower end of right fibula, subsequent encounter for closed fracture with routine healing: Secondary | ICD-10-CM | POA: Diagnosis not present

## 2014-12-23 DIAGNOSIS — X58XXXD Exposure to other specified factors, subsequent encounter: Secondary | ICD-10-CM | POA: Diagnosis not present

## 2015-01-06 ENCOUNTER — Ambulatory Visit (HOSPITAL_COMMUNITY)
Admission: RE | Admit: 2015-01-06 | Discharge: 2015-01-06 | Disposition: A | Discharge status: Home / Self Care | Payer: Medicare Other | Source: Ambulatory Visit | Attending: Internal Medicine | Admitting: Internal Medicine

## 2015-01-06 DIAGNOSIS — J449 Chronic obstructive pulmonary disease, unspecified: Secondary | ICD-10-CM | POA: Diagnosis present

## 2015-01-06 DIAGNOSIS — Z9981 Dependence on supplemental oxygen: Secondary | ICD-10-CM | POA: Diagnosis not present

## 2015-01-06 LAB — BLOOD GAS, ARTERIAL
ACID-BASE EXCESS: 8.8 mmol/L — AB (ref 0.0–2.0)
BICARBONATE: 34.9 meq/L — AB (ref 20.0–24.0)
O2 Content: 3 L/min
O2 Saturation: 89.6 %
PCO2 ART: 69.1 mmHg — AB (ref 35.0–45.0)
PO2 ART: 63.8 mmHg — AB (ref 80.0–100.0)
TCO2: 32 mmol/L (ref 0–100)
pH, Arterial: 7.323 — ABNORMAL LOW (ref 7.350–7.450)

## 2015-01-07 ENCOUNTER — Ambulatory Visit (HOSPITAL_COMMUNITY)
Admission: RE | Admit: 2015-01-07 | Discharge: 2015-01-07 | Disposition: A | Discharge status: Home / Self Care | Payer: Medicare Other | Source: Ambulatory Visit | Attending: Internal Medicine | Admitting: Internal Medicine

## 2015-01-07 DIAGNOSIS — Z9981 Dependence on supplemental oxygen: Secondary | ICD-10-CM | POA: Insufficient documentation

## 2015-01-07 DIAGNOSIS — J449 Chronic obstructive pulmonary disease, unspecified: Secondary | ICD-10-CM | POA: Diagnosis present

## 2015-01-07 LAB — BLOOD GAS, ARTERIAL
ACID-BASE EXCESS: 9.7 mmol/L — AB (ref 0.0–2.0)
Bicarbonate: 35.3 mEq/L — ABNORMAL HIGH (ref 20.0–24.0)
Drawn by: 22179
O2 Content: 3 L/min
O2 SAT: 93.3 %
PATIENT TEMPERATURE: 37
TCO2: 32.1 mmol/L (ref 0–100)
pCO2 arterial: 63.5 mmHg (ref 35.0–45.0)
pH, Arterial: 7.364 (ref 7.350–7.450)
pO2, Arterial: 71.4 mmHg — ABNORMAL LOW (ref 80.0–100.0)

## 2015-01-11 ENCOUNTER — Ambulatory Visit (HOSPITAL_COMMUNITY)
Admission: RE | Admit: 2015-01-11 | Discharge: 2015-01-11 | Disposition: A | Discharge status: Home / Self Care | Payer: Medicare Other | Source: Ambulatory Visit | Attending: Internal Medicine | Admitting: Internal Medicine

## 2015-01-11 DIAGNOSIS — J9601 Acute respiratory failure with hypoxia: Secondary | ICD-10-CM | POA: Diagnosis not present

## 2015-01-11 LAB — BLOOD GAS, ARTERIAL
Acid-Base Excess: 11.8 mmol/L — ABNORMAL HIGH (ref 0.0–2.0)
BICARBONATE: 37.7 meq/L — AB (ref 20.0–24.0)
DRAWN BY: 25788
O2 Content: 3 L/min
O2 SAT: 91.3 %
PCO2 ART: 69.9 mmHg — AB (ref 35.0–45.0)
PH ART: 7.351 (ref 7.350–7.450)
PO2 ART: 65 mmHg — AB (ref 80.0–100.0)
Patient temperature: 37
TCO2: 34.3 mmol/L (ref 0–100)

## 2015-01-14 ENCOUNTER — Ambulatory Visit (INDEPENDENT_AMBULATORY_CARE_PROVIDER_SITE_OTHER): Payer: Medicare Other | Admitting: Ophthalmology

## 2015-01-20 ENCOUNTER — Other Ambulatory Visit (HOSPITAL_COMMUNITY): Payer: Self-pay | Admitting: Internal Medicine

## 2015-01-20 ENCOUNTER — Ambulatory Visit (HOSPITAL_COMMUNITY)
Admission: RE | Admit: 2015-01-20 | Discharge: 2015-01-20 | Disposition: A | Discharge status: Home / Self Care | Payer: Medicare Other | Source: Ambulatory Visit | Attending: Internal Medicine | Admitting: Internal Medicine

## 2015-01-20 DIAGNOSIS — M25571 Pain in right ankle and joints of right foot: Secondary | ICD-10-CM | POA: Insufficient documentation

## 2015-01-20 DIAGNOSIS — X58XXXS Exposure to other specified factors, sequela: Secondary | ICD-10-CM | POA: Diagnosis not present

## 2015-01-20 DIAGNOSIS — J449 Chronic obstructive pulmonary disease, unspecified: Secondary | ICD-10-CM | POA: Diagnosis not present

## 2015-01-20 DIAGNOSIS — S8251XS Displaced fracture of medial malleolus of right tibia, sequela: Secondary | ICD-10-CM | POA: Insufficient documentation

## 2015-01-20 LAB — BLOOD GAS, ARTERIAL
ACID-BASE EXCESS: 10.9 mmol/L — AB (ref 0.0–2.0)
Bicarbonate: 36.5 mEq/L — ABNORMAL HIGH (ref 20.0–24.0)
O2 Content: 3 L/min
O2 SAT: 92.7 %
PATIENT TEMPERATURE: 37
PCO2 ART: 65.6 mmHg — AB (ref 35.0–45.0)
TCO2: 33 mmol/L (ref 0–100)
pH, Arterial: 7.364 (ref 7.350–7.450)
pO2, Arterial: 67.8 mmHg — ABNORMAL LOW (ref 80.0–100.0)

## 2015-02-03 ENCOUNTER — Ambulatory Visit (INDEPENDENT_AMBULATORY_CARE_PROVIDER_SITE_OTHER): Payer: Medicare Other | Admitting: Neurology

## 2015-02-03 ENCOUNTER — Encounter: Payer: Self-pay | Admitting: Neurology

## 2015-02-03 ENCOUNTER — Ambulatory Visit (INDEPENDENT_AMBULATORY_CARE_PROVIDER_SITE_OTHER): Payer: Medicare Other | Admitting: Ophthalmology

## 2015-02-03 VITALS — BP 118/64 | HR 88 | Resp 22 | Ht 72.0 in | Wt 324.0 lb

## 2015-02-03 DIAGNOSIS — E1140 Type 2 diabetes mellitus with diabetic neuropathy, unspecified: Secondary | ICD-10-CM | POA: Diagnosis not present

## 2015-02-03 DIAGNOSIS — I5042 Chronic combined systolic (congestive) and diastolic (congestive) heart failure: Secondary | ICD-10-CM

## 2015-02-03 DIAGNOSIS — E662 Morbid (severe) obesity with alveolar hypoventilation: Secondary | ICD-10-CM

## 2015-02-03 DIAGNOSIS — E08319 Diabetes mellitus due to underlying condition with unspecified diabetic retinopathy without macular edema: Secondary | ICD-10-CM | POA: Diagnosis not present

## 2015-02-03 DIAGNOSIS — J441 Chronic obstructive pulmonary disease with (acute) exacerbation: Secondary | ICD-10-CM

## 2015-02-03 NOTE — Progress Notes (Signed)
SLEEP MEDICINE CLINIC   Provider:  Larey Seat, M D  Referring Provider: [name], MD Primary Care Physician:  [name], MD  Chief Complaint  Patient presents with  . Sleep Apnea    consult, needs a bipap, rm 11, LPN from [facility] [place] present    HPI:  Rick Cruz is a 69 y.o. male seen here as a referral  from Dr. [name] for an evaluation for possible BiPAP use.   [name] is a resident at [place] [facility] and has been followed in the past but Dr. Merlene Laughter. He states that he underwent sleep testing there and was placed on a CPAP however serial blood gases obtained have shown that he is a CO2 retainer and he has the comorbidities of morbid obesity, diabetes type 2 uncontrolled is over 80 units of insulin plus sliding scale. Hyponatremia COPD and he has been set up with 3 L of oxygen 24/7 . I don't have access to his recent sleep studies. I don't know at what settings he used to use CPAP but the hypercapnia documented through arterial  blood test will qualify him undoubtedly for BiPAP use. He believes that he was tested for apnea and placed on CPAP over 5 years ago.  The patient has frequent nocturia and frequently interrupted sleep. He is also less mobile since he fractured an ankle. He comes in a wheelchair. His exercise tolerance is low. He appears excessive drowsy and nodds off during our conversation. He is able to stand up without assistance, to my surprise. He also reports that he hasn't been compliant with CPAP because the mask made his face itch and break out. He has facial hair which would make of mask not adhere very well. Be difficult to obtain an air seal. He reports he goes to bed between 10 and 11 PM usually he falls asleep pretty promptly he wakes up frequently due to nocturia and due to shortness of breath. He is oxygen dependent at 3 L. He falls frequently asleep during the day as a baseline extremely drowsy. COPD for 4 years , He also reports  some baseline headaches, worse in AM. . Due to his uncle controlled diabetes he has followed Dr. Sampson Goon for diabetic neuropathy. .  Review of Systems: Out of a complete 14 system review, the patient complains of only the following symptoms, and all other reviewed systems are negative. He did  fill the Epworth and  FSS .  He endorsed nothing on the review of systems other chief complaint. Past surgical history and past medical history left empty. Medication list available through his nursing facility. His morning blood sugar from 01-19-15 was 201 fasting. Sodium 134, chloride 87 carbon dioxide 35 it has been going up at times to 60 or higher. His CBC shows a low red blood cell count hemoglobin of 12 hematocrit of 42 MCH 25.7 neutrophils earlier 7.4K I have asked as to a recent hospitalization note from 12-05-14 the patient was seen in the emergency room after a fall. He had several falls in the preceding weeks 4 falls in the last 24 hours prior to admission. The last ones he stayed on the floor for 5 hours estimated unable to get up. At this time he was able to walk with a walker at home had chronic neck and back problems and was on narcotic medications prescribed by [name]. The patient has been followed by Dr. in the car psychiatry for schizophrenia and depression. Tachypnea  has followed him for CHF congestive heart failure COPD chronic obstructive pulmonary disease, morbid obesity, sleep apnea at this time noncompliant with CPAP the patient would like to switch to a BiPAP he also needs adjustments as mentioned above. He is oxygen dependent and chronic pain on narcotics.  Epworth score 8 , Fatigue severity score 56.    History   Social History  . Marital Status: Married    Spouse Name: N/A  . Number of Children: N/A  . Years of Education: N/A   Occupational History  . Not on file.   Social History Main Topics  . Smoking status: Former Smoker -- 1.00 packs/day for 50 years    Types: Cigarettes     Quit date: 09/16/2013  . Smokeless tobacco: Never Used  . Alcohol Use: No     Comment: history of ETOH abuse in remote past, none in at least 10 years  . Drug Use: No  . Sexual Activity: Not on file   Other Topics Concern  . Not on file   Social History Narrative   Drinks about 4 cups of caffeine daily. 2 cups of coffee and 2 coke zeros.    Family History  Problem Relation Age of Onset  . Colon cancer Neg Hx   . Parkinson's disease    . Thyroid disease Mother   . Parkinson's disease Father     Past Medical History  Diagnosis Date  . COPD (chronic obstructive pulmonary disease)   . CHF (congestive heart failure)     diastolic   . Hypercholesterolemia   . Depression   . Schizophrenia   . Sleep apnea     noncompliant with BiPAP  . Tobacco abuse   . On home O2     3 liters  . Chronic pain   . Diabetes mellitus without complication     insulin pump    Past Surgical History  Procedure Laterality Date  . Knee surgery      X2  . 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   . Colonoscopy  July 2011    Dr. Benson Norway: multiple polyps, internal and external hemorrhoids, diverticulosis. tubular adenoma  . Esophagogastroduodenoscopy (egd) with propofol N/A 04/20/2013    Procedure: ESOPHAGOGASTRODUODENOSCOPY (EGD) WITH PROPOFOL;  Surgeon: Daneil Dolin, MD;  Location: [facility];  Service: Endoscopy;  Laterality: N/A;    Current Outpatient Prescriptions  Medication Sig Dispense Refill  . albuterol (PROVENTIL HFA;VENTOLIN HFA) 108 (90 BASE) MCG/ACT inhaler Inhale 2 puffs into the lungs every 6 (six) hours as needed for wheezing or shortness of breath. 1 Inhaler 2  . albuterol (PROVENTIL) (2.5 MG/3ML) 0.083% nebulizer solution Take 3 mLs (2.5 mg total) by nebulization every 6 (six) hours as needed for wheezing or shortness of breath. 75 mL 0  . aspirin 81 MG chewable tablet Chew 81 mg by mouth every  morning.     . brimonidine (ALPHAGAN) 0.15 % ophthalmic solution Place 1 drop into the right eye 2 (two) times daily.     . Cinnamon 500 MG capsule Take 1,000 mg by mouth every morning.     . citalopram (CELEXA) 20 MG tablet Take 20 mg by mouth every morning.    . clopidogrel (PLAVIX) 75 MG tablet Take 75 mg by mouth every morning.    . furosemide (LASIX) 40 MG tablet Take 1 tablet (40 mg total) by mouth daily. 30 tablet 1  .  gabapentin (NEURONTIN) 300 MG capsule Take 300 mg by mouth 3 (three) times daily.    Marland Kitchen guaiFENesin (MUCINEX) 600 MG 12 hr tablet Take 1 tablet (600 mg total) by mouth 2 (two) times daily. 20 tablet 0  . HYDROcodone-acetaminophen (NORCO/VICODIN) 5-325 MG per tablet Take 1 tablet by mouth every 6 (six) hours as needed for moderate pain.    Marland Kitchen ibuprofen (ADVIL,MOTRIN) 200 MG tablet Take 200 mg by mouth every 4 (four) hours as needed for moderate pain.    . Insulin Human (INSULIN PUMP) SOLN Inject into the skin continuous. Humalog    . LORazepam (ATIVAN) 1 MG tablet Take 0.5 tablets (0.5 mg total) by mouth 2 (two) times daily as needed for anxiety. (Patient taking differently: Take 0.5 mg by mouth at bedtime. ) 20 tablet 0  . metoprolol (LOPRESSOR) 50 MG tablet Take 50 mg by mouth 2 (two) times daily.    . pantoprazole (PROTONIX) 40 MG tablet Take 40 mg by mouth 2 (two) times daily.    . predniSONE (DELTASONE) 10 MG tablet Take 40mg  po daily for 2 days then 30mg  po daily for 2 days then 20mg  po daily for 2 days then 10mg  po daily for 2 days then stop 20 tablet 0  . simvastatin (ZOCOR) 40 MG tablet Take 40 mg by mouth every evening.    . terazosin (HYTRIN) 1 MG capsule Take 1 mg by mouth at bedtime.    Marland Kitchen tiotropium (SPIRIVA HANDIHALER) 18 MCG inhalation capsule Place 1 capsule (18 mcg total) into inhaler and inhale every morning. 30 capsule 12  . traZODone (DESYREL) 150 MG tablet Take 300 mg by mouth at bedtime.     . triamcinolone cream (KENALOG) 0.1 % Apply 1 application  topically 2 (two) times daily as needed (for irritation).     . ziprasidone (GEODON) 80 MG capsule Take 80 mg by mouth every evening.     No current facility-administered medications for this visit.    Allergies as of 02/03/2015 - Review Complete 02/03/2015  Allergen Reaction Noted  . Phenergan [promethazine hcl] Other (See Comments) 04/20/2013  . Haldol [haloperidol lactate] Other (See Comments) 07/14/2014  . Metformin and related  02/03/2015    Vitals: BP 118/64 mmHg  Pulse 88  Resp 22  Ht 6' (1.829 m)  Wt 324 lb (146.965 kg)  BMI 43.93 kg/m2 Last Weight:  Wt Readings from Last 1 Encounters:  02/03/15 324 lb (146.965 kg)       Last Height:   Ht Readings from Last 1 Encounters:  02/03/15 6' (1.829 m)    Physical exam:  General: The patient is awake, alert and appears not in acute distress. The patient is poorly groomed and smells strongly of urine. Head: Normocephalic, atraumatic. Neck is supple. Mallampati 4  neck circumference:21 . Nasal airflow restricted, the patient sits and breathes with his mouth gaping open, while wearing an oxygen canula. , TMJ is not  evident . Poor dentition . Retrognathia is seen.  Cardiovascular:  Regular rate and rhythm , with distended neck veins. Respiratory: Lungs are restricted movement-  to auscultation. Skin:  With severe ankle edema, some antebrachial  rash Trunk: BMI is severely  elevated and patient  has normal posture.  Neurologic exam : The patient is drowsy, oriented to place and time.   Memory subjective   described as intact. There is a normal attention span & concentration ability. Speech is non  fluent with dysarthria, dysphonia , possible  Aphasia. Mood and affect are  appropriate.  Cranial nerves: Pupils are unequal and only sluggishly reactive to light. Funduscopic exam deferred . 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 . The left eye is able to read,  Extraocular movements  in  vertical and horizontal planes intact and without nystagmus. Visual fields by finger perimetry are intact. Hearing to finger rub intact.  Facial sensation intact to fine touch. Facial motor strength is symmetric and tongue and uvula move midline.  Motor exam:  Normal grip strength,  Sensory:  Fine touch, pinprick and vibration were tested in all extremities. His feet are numbn, loss of vibration, loss of fine touch, excruciating pain in toes and feet at other times. Proprioception is abnormal.  Coordination: Rapid alternating movements in the fingers/hands is normal.  Finger-to-nose maneuver was normal without evidence of ataxia, dysmetria or tremor.  Gait and station: Patient walks with assistive device and is able to stand briefly. . Strength within normal limits. Stance is stable and normal.  Tandem gait is deferred Romberg testing is equivocal.   Deep tendon reflexes: in the  upper and lower extremities are symmetric and intact.  Babinski maneuver response is equivocal    Assessment:  After physical and neurologic examination, review of laboratory studies, imaging, neurophysiology testing and pre-existing records, assessment is   1 related to the patient's risk factors of morbid obesity, uncontrolled diabetes mellitus, history of tobacco abuse and COPD, congestive heart failure and poor by arterial blood gas CO2 retention this patient should be placed on BiPAP. I do not know how severe his apnea may be his current weight is 324 pounds I would like for him to be placed on an BiPAP auto titration. Usually this class of patient needs to be tested in a hospital environment for CO2 retention and to know the degree of apnea at night. He already owns a CPAP but is noncompliant at this time. Due to his mobility issues and weight cannot admit him to the in lab testing here at [facility] and therefore we will do this as a home sleep test.   The patient was advised of the nature of  the diagnosed sleep disorder , the treatment options and risks for general a health and wellness arising from not treating the condition. Visit duration was 50 minutes.  The patient agrees with the plan to rather have a home sleep test the BiPAP titration can be used as an out to full titration which means that I will set a window on a BiPAP machine for him to use overnight and then retrieve the date to see which of the settings seems to be most beneficial for him. Patient needs to be clean-shaven to allow a CPAP or BiPAP mask to seal. And he understands my instructions and agrees with the assessment and plan. Will remain on his 3 L oxygen supplements which I cannot change but it may be overnight applied through the CPAP-BiPAP. The patient may prefer a nasal mask p or pillow , currently using full face mask . [facility].    Plan:  Treatment plan and additional workup :  HST to confirm the AHI is 10 or higher.  BiPAP at auto function 8 over 4 through 15 over 11 cm water.  Rv in 2 month, for sleep medicine only. i will not resume diabetic neuropathy care, pain medication or  General headache treatment, as a primary neurologist is in [place].      Larey Seat MD  02/03/2015              

## 2015-02-08 ENCOUNTER — Other Ambulatory Visit: Payer: Self-pay

## 2015-02-08 DIAGNOSIS — G4733 Obstructive sleep apnea (adult) (pediatric): Secondary | ICD-10-CM

## 2015-02-08 NOTE — Progress Notes (Signed)
Per [facility], needs an ONO study on O2 on 3L to qualify for bipap.

## 2015-02-09 ENCOUNTER — Ambulatory Visit (INDEPENDENT_AMBULATORY_CARE_PROVIDER_SITE_OTHER): Payer: Medicare Other | Admitting: Ophthalmology

## 2015-02-09 DIAGNOSIS — I10 Essential (primary) hypertension: Secondary | ICD-10-CM

## 2015-02-09 DIAGNOSIS — H43813 Vitreous degeneration, bilateral: Secondary | ICD-10-CM

## 2015-02-09 DIAGNOSIS — H34231 Retinal artery branch occlusion, right eye: Secondary | ICD-10-CM | POA: Diagnosis not present

## 2015-02-09 DIAGNOSIS — H34211 Partial retinal artery occlusion, right eye: Secondary | ICD-10-CM | POA: Diagnosis not present

## 2015-02-09 DIAGNOSIS — H35033 Hypertensive retinopathy, bilateral: Secondary | ICD-10-CM | POA: Diagnosis not present

## 2015-02-16 ENCOUNTER — Ambulatory Visit (HOSPITAL_BASED_OUTPATIENT_CLINIC_OR_DEPARTMENT_OTHER): Payer: Medicare Other | Attending: Internal Medicine | Admitting: Radiology

## 2015-02-16 VITALS — Ht 72.0 in | Wt 320.0 lb

## 2015-02-16 DIAGNOSIS — G4733 Obstructive sleep apnea (adult) (pediatric): Secondary | ICD-10-CM

## 2015-02-16 DIAGNOSIS — G4730 Sleep apnea, unspecified: Secondary | ICD-10-CM | POA: Insufficient documentation

## 2015-02-16 DIAGNOSIS — G4710 Hypersomnia, unspecified: Secondary | ICD-10-CM | POA: Insufficient documentation

## 2015-02-19 DIAGNOSIS — G4733 Obstructive sleep apnea (adult) (pediatric): Secondary | ICD-10-CM | POA: Diagnosis not present

## 2015-02-19 NOTE — Sleep Study (Signed)
   NAME: Rick Cruz DATE OF BIRTH:  08-Jan-1946 MEDICAL RECORD NUMBER [id number]  [facility]  PHYSICIAN: YOUNG,CLINTON D  DATE OF STUDY: 02/16/2015  SLEEP STUDY TYPE: Nocturnal Polysomnogram-CPAP titration               REFERRING PHYSICIAN: Pincus Badder*  INDICATION FOR STUDY: Hypersomnia with sleep apnea  EPWORTH SLEEPINESS SCORE:   HEIGHT: 6' (182.9 cm)  WEIGHT: (!) 320 lb (145.151 kg)    Body mass index is 43.39 kg/(m^2).  NECK SIZE: 19.5 in.  MEDICATIONS: Charted for review  SLEEP ARCHITECTURE: Total sleep time 263 minutes with sleep efficiency 73%. Stage I was 7.6%, stage II 78.7%, stage III absent, REM 13.7% of total sleep time. Sleep latency 0.5 minutes, REM latency 148 minutes, awake after sleep onset 74 minutes, arousal index  10.7, bedtime medication: Metoprolol, Protonix, Mucinex, Spiriva, trazodone, gabapentin, hydrocodone-acetaminophen, lorazepam all taken at 2215. Continuous insulin pump. Additional hydrocodone-acetaminophen taken it 0400 for back pain.  RESPIRATORY DATA: The patient was started with CPAP but he refused this and requested bilevel, which he uses at home. Successful bilevel titration to inspiratory 13 and expiratory 7 CWP, AHI 3.1 per hour. He wore a medium fullface mask.  OXYGEN DATA: The study was performed with the patient wearing supplemental oxygen at 3 L/m as ordered. Snoring was prevented by bilevel (BiPAP) with mean oxygen saturation on 3 L oxygen 91.2%  CARDIAC DATA: Sinus rhythm with PVCs and PACs  MOVEMENT/PARASOMNIA: No significant movement disturbance, bathroom 1.  IMPRESSION/ RECOMMENDATION:   1) Successful bilevel titration (the patient refused CPAP) to inspiratory 13 and expiratory 7 CWP, AHI 3.1 per hour. He wore a medium F&P Simplus fullface mask with heated humidifier. Supplemental oxygen was provided at 3 L/m throughout the study as ordered. Snoring was prevented by bilevel PAP and mean oxygen saturation was 91.2%. 2) The technician commented that the patient was noted to be lifting up his mask, moaning and yelling throughout the study. He was described as tolerating the bilevel trial well once he settled down from his anxiety. 3) Baseline polysomnogram on 08/16/2014 recorded AHI 106 per hour with supplemental oxygen at 4 L, CPAP titrated to 13, BMI for that study was 40.  Deneise Lever Diplomate, American Board of Sleep Medicine  ELECTRONICALLY SIGNED ON:  02/19/2015, 9:21 AM [place] PH: [phone]   FX: [phone] [place]

## 2015-02-28 DIAGNOSIS — E1165 Type 2 diabetes mellitus with hyperglycemia: Secondary | ICD-10-CM | POA: Insufficient documentation

## 2015-03-31 ENCOUNTER — Ambulatory Visit: Payer: Medicare Other | Admitting: Neurology

## 2015-04-05 ENCOUNTER — Telehealth: Payer: Self-pay

## 2015-04-05 ENCOUNTER — Ambulatory Visit (INDEPENDENT_AMBULATORY_CARE_PROVIDER_SITE_OTHER): Payer: Medicare Other | Admitting: Neurology

## 2015-04-05 ENCOUNTER — Encounter: Payer: Self-pay | Admitting: Neurology

## 2015-04-05 VITALS — BP 104/66 | HR 84 | Resp 20 | Ht 72.0 in | Wt 307.0 lb

## 2015-04-05 DIAGNOSIS — Z91199 Patient's noncompliance with other medical treatment and regimen due to unspecified reason: Secondary | ICD-10-CM | POA: Insufficient documentation

## 2015-04-05 DIAGNOSIS — Z9119 Patient's noncompliance with other medical treatment and regimen: Secondary | ICD-10-CM

## 2015-04-05 DIAGNOSIS — G4731 Primary central sleep apnea: Secondary | ICD-10-CM | POA: Diagnosis not present

## 2015-04-05 DIAGNOSIS — E662 Morbid (severe) obesity with alveolar hypoventilation: Secondary | ICD-10-CM

## 2015-04-05 NOTE — Telephone Encounter (Signed)
Pt did not show for his appt with Dr. Brett Fairy today.

## 2015-04-05 NOTE — Progress Notes (Signed)
SLEEP MEDICINE CLINIC   Provider:  Larey Seat, M D  Referring Provider: [name], MD Primary Care Physician:  [name], MD  Chief Complaint  Patient presents with  . Follow-up    new bipap user, rm 10, with son    HPI:  Rick Cruz is a 69 y.o. male seen here as a referral  from Dr. [name] for an evaluation for possible BiPAP use.   Rick Cruz is a resident at [place] [facility] and has been followed in the past but Dr. Merlene Laughter. He states that he underwent sleep testing there and was placed on a CPAP however serial blood gases obtained have shown that he is a CO2 retainer and he has the comorbidities of morbid obesity, diabetes type 2 uncontrolled is over 80 units of insulin plus sliding scale. Hyponatremia COPD and he has been set up with 3 L of oxygen 24/7 . I don't have access to his recent sleep studies. I don't know at what settings he used to use CPAP but the hypercapnia documented through arterial  blood test will qualify him undoubtedly for BiPAP use. He believes that he was tested for apnea and placed on CPAP over 5 years ago.  The patient has frequent nocturia and frequently interrupted sleep. He is also less mobile since he fractured an ankle. He comes in a wheelchair. His exercise tolerance is low. He appears excessive drowsy and nodds off during our conversation. He is able to stand up without assistance, to my surprise. He also reports that he hasn't been compliant with CPAP because the mask made his face itch and break out. He has facial hair which would make of mask not adhere very well. Be difficult to obtain an air seal. He reports he goes to bed between 10 and 11 PM usually he falls asleep pretty promptly he wakes up frequently due to nocturia and due to shortness of breath. He is oxygen dependent at 3 L. He falls frequently asleep during the day as a baseline extremely drowsy. COPD for 4 years , He also reports some baseline headaches, worse in AM. .  Due to his uncle controlled diabetes he has followed Dr. Sampson Goon for diabetic neuropathy.  04-05-15 Interval history,  Patient has had 2 no show appointments , now here in time.  Rick Cruz reports that his mask broke and therefore he had a poor compliance record Mrs. VPAP machine his machine is a so-called variable out of setting with a 15 cm water maximum inspiratory pressure support and a minimum pressure support of 8 cm water. The residual AHI in June was 8.5 but the compliance was rather pitiful was only 3 days of the machine being used over 4 hours. Again he complains that the mask was not working due to it having a crack or being broken and his AHI is accordingly much higher this month at 17.5. There are very high air leaks. He could not tolerate a nasal pillow very well which would have otherwise been optimal for the cystocele component. He prefers at least a mask that covers his nose. He actually  prefers a full face mask. He shaves only once a week, this doesn't help.  He is on continued oxygen 24 7 now. I reviewed his medication list. I am not quite sure if the AHI is related to the higher air leak but I believe that the last 5 days were especially poor in comparison to their apnea index. I would be  happy if Mr. Alamos apneas score would just be below 10 I do not expect the optimal result at or below 5 apneas per hour. So I encouraged him to try to use the machine at least 4 hours each night he may use it while he is sitting in a recliner or in bed if he needs an elevated head of bed that may help his breathing as well. I component of oxygen being bled into the VPAP machine at night. He will have the next 30 days to improve his compliance record. His highest AHI is on days of the least used time. He has the lowest [facility] on a day he used the machine 7 hours. I encouraged him and his son to  establish a better pattern ,otherwise he will be considered non compliant and dismissed.   Review of  Systems: Out of a complete 14 system review, the patient complains of only the following symptoms, and all other reviewed systems are negative. He did  fill the Epworth and  FSS .     He had several falls in the preceding weeks 4 falls in the last 24 hours prior to admission. The last ones he stayed on the floor for 5 hours estimated unable to get up. At this time he was able to walk with a walker at home had chronic neck and back problems and was on narcotic medications prescribed by [name]. The patient has been followed by Dr. in the car psychiatry for schizophrenia and depression. Tachypnea has followed him for CHF congestive heart failure COPD chronic obstructive pulmonary disease, morbid obesity, sleep apnea at this time noncompliant with CPAP the patient would like to switch to a BiPAP he also needs adjustments as mentioned above. He is oxygen dependent and chronic pain on narcotics.  Epworth score 8 , Fatigue severity score 56.    History   Social History  . Marital Status: Married    Spouse Name: N/A  . Number of Children: N/A  . Years of Education: N/A   Occupational History  . Not on file.   Social History Main Topics  . Smoking status: Former Smoker -- 1.00 packs/day for 50 years    Types: Cigarettes    Quit date: 09/16/2013  . Smokeless tobacco: Never Used  . Alcohol Use: No     Comment: history of ETOH abuse in remote past, none in at least 10 years  . Drug Use: No  . Sexual Activity: Not on file   Other Topics Concern  . Not on file   Social History Narrative   Drinks about 4 cups of caffeine daily. 2 cups of coffee and 2 coke zeros.    Family History  Problem Relation Age of Onset  . Colon cancer Neg Hx   . Parkinson's disease    . Thyroid disease Mother   . Parkinson's disease Father     Past Medical History  Diagnosis Date  . COPD (chronic obstructive pulmonary disease)   . CHF (congestive heart failure)     diastolic   . Hypercholesterolemia   .  Depression   . Schizophrenia   . Sleep apnea     noncompliant with BiPAP  . Tobacco abuse   . On home O2     3 liters  . Chronic pain   . Diabetes mellitus without complication     insulin pump    Past Surgical History  Procedure Laterality Date  . Knee surgery  X2  . 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   . Colonoscopy  July 2011    Dr. Benson Norway: multiple polyps, internal and external hemorrhoids, diverticulosis. tubular adenoma  . Esophagogastroduodenoscopy (egd) with propofol N/A 04/20/2013    Procedure: ESOPHAGOGASTRODUODENOSCOPY (EGD) WITH PROPOFOL;  Surgeon: Daneil Dolin, MD;  Location: [facility];  Service: Endoscopy;  Laterality: N/A;    Current Outpatient Prescriptions  Medication Sig Dispense Refill  . albuterol (PROVENTIL HFA;VENTOLIN HFA) 108 (90 BASE) MCG/ACT inhaler Inhale 2 puffs into the lungs every 6 (six) hours as needed for wheezing or shortness of breath. 1 Inhaler 2  . albuterol (PROVENTIL) (2.5 MG/3ML) 0.083% nebulizer solution Take 3 mLs (2.5 mg total) by nebulization every 6 (six) hours as needed for wheezing or shortness of breath. 75 mL 0  . aspirin 81 MG chewable tablet Chew 81 mg by mouth every morning.     . brimonidine (ALPHAGAN) 0.15 % ophthalmic solution Place 1 drop into the right eye 2 (two) times daily.     . cholecalciferol (VITAMIN D) 1000 UNITS tablet Take 2,000 Units by mouth daily.    . Cinnamon 500 MG capsule Take 1,000 mg by mouth every morning.     . citalopram (CELEXA) 20 MG tablet Take 20 mg by mouth every morning.    . clopidogrel (PLAVIX) 75 MG tablet Take 75 mg by mouth every morning.    . furosemide (LASIX) 40 MG tablet Take 1 tablet (40 mg total) by mouth daily. 30 tablet 1  . gabapentin (NEURONTIN) 300 MG capsule Take 300 mg by mouth 3 (three) times daily.    Marland Kitchen guaiFENesin (MUCINEX) 600 MG 12 hr tablet Take 1 tablet (600 mg total) by mouth 2 (two)  times daily. 20 tablet 0  . HYDROcodone-acetaminophen (NORCO/VICODIN) 5-325 MG per tablet Take 1 tablet by mouth every 6 (six) hours as needed for moderate pain.    Marland Kitchen ibuprofen (ADVIL,MOTRIN) 200 MG tablet Take 200 mg by mouth every 4 (four) hours as needed for moderate pain.    . Insulin Human (INSULIN PUMP) SOLN Inject into the skin continuous. Humalog    . LORazepam (ATIVAN) 1 MG tablet Take 0.5 tablets (0.5 mg total) by mouth 2 (two) times daily as needed for anxiety. (Patient taking differently: Take 0.5 mg by mouth at bedtime. ) 20 tablet 0  . metoprolol (LOPRESSOR) 50 MG tablet Take 50 mg by mouth 2 (two) times daily.    . pantoprazole (PROTONIX) 40 MG tablet Take 40 mg by mouth 2 (two) times daily.    . predniSONE (DELTASONE) 10 MG tablet Take 40mg  po daily for 2 days then 30mg  po daily for 2 days then 20mg  po daily for 2 days then 10mg  po daily for 2 days then stop 20 tablet 0  . simvastatin (ZOCOR) 40 MG tablet Take 40 mg by mouth every evening.    . terazosin (HYTRIN) 1 MG capsule Take 1 mg by mouth at bedtime.    Marland Kitchen tiotropium (SPIRIVA HANDIHALER) 18 MCG inhalation capsule Place 1 capsule (18 mcg total) into inhaler and inhale every morning. 30 capsule 12  . traZODone (DESYREL) 150 MG tablet Take 300 mg by mouth at bedtime.     . triamcinolone cream (KENALOG) 0.1 % Apply 1 application topically 2 (two) times daily as needed (for irritation).     . ziprasidone (GEODON) 80 MG capsule Take 80 mg by mouth every  evening.     No current facility-administered medications for this visit.    Allergies as of 04/05/2015 - Review Complete 04/05/2015  Allergen Reaction Noted  . Phenergan [promethazine hcl] Other (See Comments) 04/20/2013  . Haldol [haloperidol lactate] Other (See Comments) 07/14/2014  . Metformin and related  02/03/2015    Vitals: BP 104/66 mmHg  Pulse 84  Resp 20  Ht 6' (1.829 m)  Wt 307 lb (139.254 kg)  BMI 41.63 kg/m2 Last Weight:  Wt Readings from Last 1  Encounters:  04/05/15 307 lb (139.254 kg)       Last Height:   Ht Readings from Last 1 Encounters:  04/05/15 6' (1.829 m)    Physical exam:  General: The patient is awake, alert and appears not in acute distress. The patient is poorly groomed and smells strongly of urine. Head: Normocephalic, atraumatic. Neck is supple. Mallampati 4  neck circumference:21 . Nasal airflow restricted, the patient sits and breathes with his mouth gaping open, while wearing an oxygen canula. , TMJ is not  evident . Poor dentition . Retrognathia is seen.  Cardiovascular:  Regular rate and rhythm , with distended neck veins. Respiratory: Lungs are restricted movement-  to auscultation. Skin:  With severe ankle edema, some antebrachial  rash Trunk: BMI is severely  elevated and patient  has normal posture.  Neurologic exam : The patient is drowsy, oriented to place and time.   Memory subjective   described as intact. There is a normal attention span & concentration ability. Speech is non  fluent with dysarthria, dysphonia , possible  Aphasia. Mood and affect are appropriate.  Cranial nerves: Pupils are unequal and only sluggishly reactive to light. Funduscopic exam deferred . 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 With  left eye is able to read,  Extraocular movements  in vertical and horizontal planes intact and without nystagmus. Visual fields by finger perimetry are intact. Hearing to finger rub intact.   Facial motor strength is symmetric and tongue and uvula move midline. Motor exam:  Normal grip strength,  Sensory:  Fine touch, pinprick and vibration were tested in all extremities.  His feet are numbn, loss of vibration, loss of fine touch, excruciating pain in toes and feet at other times.  Proprioception is abnormal.  Coordination: Rapid alternating movements in the fingers/hands is normal.  Finger-to-nose maneuver was normal without evidence of ataxia,  dysmetria or tremor.  Gait and station: Patient walks with assistive device and is able to stand briefly.  Came in in a wheelchair . Stance is instable. SOB.   Tandem gait is deferred Romberg testing is equivocal.   Deep tendon reflexes: in the  upper and lower extremities are symmetric and intact.  Babinski maneuver response is equivocal    Assessment:  After physical and neurologic examination, review of laboratory studies, imaging, neurophysiology testing and pre-existing records, assessment is   1 related to the patient's risk factors of morbid obesity, uncontrolled diabetes mellitus, history of tobacco abuse and COPD, congestive heart failure and poor by arterial blood gas CO2 retention this patient should be placed on BiPAP. I do not know how severe his apnea may be his current weight is 324 pounds I would like for him to be placed on an BiPAP auto titration. Usually this class of patient needs to be tested in a hospital environment for CO2 retention and to know the degree of apnea at night. He already owns a CPAP  but is noncompliant at this time. Due to his mobility issues and weight cannot admit him to the in lab testing here at [facility] and therefore we will do this as a home sleep test. Patient continues to use a variable outer Pap. He needs to work on his compliance but the settings seem to work if the patient is able to tolerate CPAP for over 4 hours of nightly use. The AHI on those nights is that under 7. In addition I would like to add that the fatigue severity score still very high at 52 points and the Epworth sleepiness score at 15 points on 7-19- 2016  [facility] [place].    Plan:  Treatment plan and additional workup :  Rv in 3 month with Np , for sleep medicine only.  i will not resume diabetic neuropathy care, pain medication or  General headache treatment, as a primary neurologist is in [place].      Asencion Partridge Mayela Bullard  MD  04/05/2015

## 2015-04-05 NOTE — Telephone Encounter (Signed)
Pt did not show for his 7/14 appt with Dr. Brett Fairy

## 2015-04-10 ENCOUNTER — Telehealth: Payer: Self-pay | Admitting: Neurology

## 2015-04-10 NOTE — Telephone Encounter (Signed)
Rick Cruz called to say he was having with his BiPAP machine.     I advised him to call his DME company ([employer]) and that I would pass a message along to Dr. Brett Fairy

## 2015-04-18 ENCOUNTER — Other Ambulatory Visit: Payer: Self-pay | Admitting: Gastroenterology

## 2015-05-03 ENCOUNTER — Encounter (HOSPITAL_COMMUNITY): Payer: Self-pay | Admitting: *Deleted

## 2015-05-05 NOTE — H&P (Signed)
  Rick Cruz HPI: At this time the patient denies any problems with nausea, vomiting, fevers, chills, abdominal pain, diarrhea, constipation, hematochezia, melena, GERD, or dysphagia. The patient denies any known family history of colon cancers. No complaints of chest pain, SOB, MI, or sleep apnea. On 12/13/2008 the patient underwent a colonsocopy with Dr. Lajoyce Corners with findings of a large cecal polyp. That same day the patient underwent a right hemicolectomy with Dr. Lucia Gaskins. 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 The follow up colonoscopy in 03/24/2010 was significant for two smaller transverse colon adenomas.  Past Medical History  Diagnosis Date  . COPD (chronic obstructive pulmonary disease)   . CHF (congestive heart failure)     diastolic   . Hypercholesterolemia   . Depression   . Schizophrenia   . Sleep apnea     noncompliant with BiPAP  . Tobacco abuse   . On home O2     3 liters  . Chronic pain   . Diabetes mellitus without complication     insulin pump    Past Surgical History  Procedure Laterality Date  . Knee surgery      X2  . 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   . Colonoscopy  July 2011    Dr. Benson Norway: multiple polyps, internal and external hemorrhoids, diverticulosis. tubular adenoma  . Esophagogastroduodenoscopy (egd) with propofol N/A 04/20/2013    Procedure: ESOPHAGOGASTRODUODENOSCOPY (EGD) WITH PROPOFOL;  Surgeon: Daneil Dolin, MD;  Location: [facility];  Service: Endoscopy;  Laterality: N/A;    Family History  Problem Relation Age of Onset  . Colon cancer Neg Hx   . Parkinson's disease    . Thyroid disease Mother   . Parkinson's disease Father     Social History:  reports that he quit smoking about 19 months ago. His smoking use included Cigarettes. He has a 50 pack-year smoking history. He has never used smokeless tobacco. He reports that he does  not drink alcohol or use illicit drugs.  Allergies:  Allergies  Allergen Reactions  . Phenergan [Promethazine Hcl] Other (See Comments)    Becomes very confused and aggressive  . Haldol [Haloperidol Lactate] Other (See Comments)    Shaking   . Metformin And Related Diarrhea    Medications: Scheduled: Continuous:  No results found for this or any previous visit (from the past 24 hour(s)).   No results found.  ROS:  As stated above in the HPI otherwise negative.  There were no vitals taken for this visit.    PE: Gen: NAD, Alert and Oriented HEENT:  Rick Cruz/AT, EOMI Neck: Supple, no LAD Lungs: CTA Bilaterally CV: RRR without M/G/R ABM: Soft, NTND, +BS Ext: No C/C/E  Assessment/Plan: 1) Personal history of polyps - Colonoscopy.  Rick Cruz 05/05/2015, 12:49 PM

## 2015-05-06 ENCOUNTER — Encounter (HOSPITAL_COMMUNITY)
Admission: RE | Disposition: A | Discharge status: Home / Self Care | Payer: Self-pay | Source: Ambulatory Visit | Attending: Gastroenterology

## 2015-05-06 ENCOUNTER — Encounter (HOSPITAL_COMMUNITY): Payer: Self-pay | Admitting: Anesthesiology

## 2015-05-06 ENCOUNTER — Ambulatory Visit (HOSPITAL_COMMUNITY)
Admission: RE | Admit: 2015-05-06 | Discharge: 2015-05-06 | Disposition: A | Discharge status: Home / Self Care | Payer: Medicare Other | Source: Ambulatory Visit | Attending: Gastroenterology | Admitting: Gastroenterology

## 2015-05-06 ENCOUNTER — Encounter (HOSPITAL_COMMUNITY): Payer: Self-pay | Admitting: *Deleted

## 2015-05-06 DIAGNOSIS — Z9981 Dependence on supplemental oxygen: Secondary | ICD-10-CM | POA: Diagnosis not present

## 2015-05-06 DIAGNOSIS — E780 Pure hypercholesterolemia: Secondary | ICD-10-CM | POA: Diagnosis not present

## 2015-05-06 DIAGNOSIS — Z1211 Encounter for screening for malignant neoplasm of colon: Secondary | ICD-10-CM | POA: Insufficient documentation

## 2015-05-06 DIAGNOSIS — G8929 Other chronic pain: Secondary | ICD-10-CM | POA: Diagnosis not present

## 2015-05-06 DIAGNOSIS — Z5309 Procedure and treatment not carried out because of other contraindication: Secondary | ICD-10-CM | POA: Diagnosis not present

## 2015-05-06 DIAGNOSIS — E119 Type 2 diabetes mellitus without complications: Secondary | ICD-10-CM | POA: Insufficient documentation

## 2015-05-06 DIAGNOSIS — G4730 Sleep apnea, unspecified: Secondary | ICD-10-CM | POA: Insufficient documentation

## 2015-05-06 DIAGNOSIS — Z87891 Personal history of nicotine dependence: Secondary | ICD-10-CM | POA: Insufficient documentation

## 2015-05-06 DIAGNOSIS — I5030 Unspecified diastolic (congestive) heart failure: Secondary | ICD-10-CM | POA: Insufficient documentation

## 2015-05-06 DIAGNOSIS — Z86010 Personal history of colonic polyps: Secondary | ICD-10-CM | POA: Insufficient documentation

## 2015-05-06 DIAGNOSIS — J449 Chronic obstructive pulmonary disease, unspecified: Secondary | ICD-10-CM | POA: Diagnosis not present

## 2015-05-06 SURGERY — CANCELLED PROCEDURE

## 2015-05-06 MED ORDER — PROPOFOL 10 MG/ML IV BOLUS
INTRAVENOUS | Status: AC
  Filled 2015-05-06: qty 20

## 2015-05-06 MED ORDER — SODIUM CHLORIDE 0.9 % IV SOLN: INTRAVENOUS | Status: DC

## 2015-05-06 MED ORDER — LIDOCAINE HCL (CARDIAC) 20 MG/ML IV SOLN
INTRAVENOUS | Status: AC
  Filled 2015-05-06: qty 5

## 2015-05-06 MED ORDER — LACTATED RINGERS IV SOLN: INTRAVENOUS | Status: DC

## 2015-05-06 MED ORDER — KETAMINE HCL 10 MG/ML IJ SOLN
INTRAMUSCULAR | Status: AC
  Filled 2015-05-06: qty 1

## 2015-05-06 SURGICAL SUPPLY — 22 items

## 2015-05-06 NOTE — Progress Notes (Signed)
Pt did not hold plavix or ASA for colonoscopy to day and per pt his last stool was medium consistency and dark still. Dr. Benson Norway consulted. Per Dr. Benson Norway, procedure cancelled. Brt, rn

## 2015-05-06 NOTE — Anesthesia Preprocedure Evaluation (Deleted)
Anesthesia Evaluation  Patient identified by MRN, date of birth, ID band Patient awake    Reviewed: Allergy & Precautions, H&P , NPO status , Patient's Chart, lab work & pertinent test results, reviewed documented beta blocker date and time   Airway Mallampati: II  TM Distance: >3 FB     Dental  (+) Poor Dentition   Pulmonary shortness of breath and Long-Term Oxygen Therapy, sleep apnea and Continuous Positive Airway Pressure Ventilation , COPD oxygen dependent, former smoker,  Sat 90% on 2L/M [place], mildy SOB with occas prod cough. + rhonchi         Cardiovascular Exercise Tolerance: Poor hypertension, Pt. on medications and Pt. on home beta blockers + CAD and +CHF Rhythm:Regular Rate:Tachycardia     Neuro/Psych PSYCHIATRIC DISORDERS Depression Schizophrenia negative neurological ROS     GI/Hepatic Neg liver ROS, PUD,   Endo/Other  diabetes, Type 2, Insulin DependentMorbid obesity  Renal/GU negative Renal ROS     Musculoskeletal   Abdominal   Peds  Hematology negative hematology ROS (+)   Anesthesia Other Findings   Reproductive/Obstetrics                            Anesthesia Physical  Anesthesia Plan  ASA: III  Anesthesia Plan: MAC   Post-op Pain Management:    Induction: Intravenous  Airway Management Planned: Simple Face Mask  Additional Equipment:   Intra-op Plan:   Post-operative Plan:   Informed Consent: I have reviewed the patients History and Physical, chart, labs and discussed the procedure including the risks, benefits and alternatives for the proposed anesthesia with the patient or authorized representative who has indicated his/her understanding and acceptance.     Plan Discussed with:   Anesthesia Plan Comments: (Pt and wife agree with plan to sedate with propofol as long as O2 sats and respiration remain adequate.)       Anesthesia Quick Evaluation

## 2015-07-05 DIAGNOSIS — E7800 Pure hypercholesterolemia, unspecified: Secondary | ICD-10-CM | POA: Insufficient documentation

## 2015-07-06 ENCOUNTER — Ambulatory Visit (INDEPENDENT_AMBULATORY_CARE_PROVIDER_SITE_OTHER): Payer: Medicare Other | Admitting: Adult Health

## 2015-07-06 ENCOUNTER — Encounter: Payer: Self-pay | Admitting: Adult Health

## 2015-07-06 VITALS — BP 107/65 | HR 70 | Ht 72.0 in

## 2015-07-06 DIAGNOSIS — G4731 Primary central sleep apnea: Secondary | ICD-10-CM | POA: Diagnosis not present

## 2015-07-06 DIAGNOSIS — E662 Morbid (severe) obesity with alveolar hypoventilation: Secondary | ICD-10-CM

## 2015-07-06 NOTE — Progress Notes (Signed)
I agree with the assessment and plan as directed by NP .The patient is known to me .   Nivea Wojdyla, MD  

## 2015-07-06 NOTE — Progress Notes (Signed)
PATIENT: Rick Cruz DOB: 1946/03/02  REASON FOR VISIT: follow up- central sleep apnea HISTORY FROM: patient  HISTORY OF PRESENT ILLNESS: Rick Cruz is a 69 year old male with a history of central sleep apnea. The patient has had poor compliance with CPAP in the past. At the last visit he was switched to BiPAP auto titration. However the patient did not bring his machine nor his card with him to this visit. We were however able to get a download from his DME company. The patient uses his machine 24/30 days for compliance of 80%. He used his machine greater than 4 hours only 11 out of 30 days for compliance of 37%. On average he uses his machine 3 hours and 29 minutes. The patient is on a minimum expiratory pressure of 15 cm of water and minimum inspiratory pressure of 8cm water with pressure support of 4 cm of water. His residual AHI of 16.3 the patient does have a significant leak. It is 92.1 L/m in the 95th percentile. Patient states that he typically forgets to put his machine on at night. He states that he will sit in his chair and dozes off and when he wakes up he sometimes is disoriented and forgets to put the mask on. The patient also states that at first he was having trouble with his mask however now he has the nasal mask and that seems to be working well for him. He states that in the last 3 weeks he started to wear his machine every night. The patient is now back at home. The patient states that he is willing to give the BiPAP and honest try. He returns today for an evaluation.  REVIEW OF SYSTEMS: Out of a complete 14 system review of symptoms, the patient complains only of the following symptoms, and all other reviewed systems are negative.  Fatigue severity score 54, Epworth sleepiness score 7 Fatigue, shortness of breath, loss of vision, eye itching, excessive thirst, frequency of urination, back pain, aching muscles, muscle cramps, rash, nervous/anxious, agitation, weakness,  headache, dizziness, bruise/bleed easily  ALLERGIES: Allergies  Allergen Reactions  . Phenergan [Promethazine Hcl] Other (See Comments)    Becomes very confused and aggressive  . Haldol [Haloperidol Lactate] Other (See Comments)    Shaking   . Metformin And Related Diarrhea    HOME MEDICATIONS: Outpatient Prescriptions Prior to Visit  Medication Sig Dispense Refill  . albuterol (PROVENTIL HFA;VENTOLIN HFA) 108 (90 BASE) MCG/ACT inhaler Inhale 2 puffs into the lungs every 6 (six) hours as needed for wheezing or shortness of breath. 1 Inhaler 2  . albuterol (PROVENTIL) (2.5 MG/3ML) 0.083% nebulizer solution Take 3 mLs (2.5 mg total) by nebulization every 6 (six) hours as needed for wheezing or shortness of breath. 75 mL 0  . aspirin 81 MG chewable tablet Chew 81 mg by mouth every morning.     . cholecalciferol (VITAMIN D) 1000 UNITS tablet Take 2,000 Units by mouth daily.    . Cinnamon 500 MG capsule Take 1,000 mg by mouth every morning.     . citalopram (CELEXA) 20 MG tablet Take 20 mg by mouth every morning.    . clopidogrel (PLAVIX) 75 MG tablet Take 75 mg by mouth every morning.    . furosemide (LASIX) 40 MG tablet Take 1 tablet (40 mg total) by mouth daily. 30 tablet 1  . gabapentin (NEURONTIN) 300 MG capsule Take 300 mg by mouth 4 (four) times daily.     Marland Kitchen guaiFENesin (MUCINEX) 600  MG 12 hr tablet Take 1 tablet (600 mg total) by mouth 2 (two) times daily. 20 tablet 0  . HYDROcodone-acetaminophen (NORCO/VICODIN) 5-325 MG per tablet Take 1 tablet by mouth every 4 (four) hours as needed for moderate pain.     . Insulin Human (INSULIN PUMP) SOLN Inject into the skin continuous. Humalog    . pantoprazole (PROTONIX) 40 MG tablet Take 40 mg by mouth 2 (two) times daily.    . pantoprazole (PROTONIX) 40 MG tablet Take 40 mg by mouth 2 (two) times daily.    . predniSONE (DELTASONE) 10 MG tablet Take 40mg  po daily for 2 days then 30mg  po daily for 2 days then 20mg  po daily for 2 days then 10mg   po daily for 2 days then stop 20 tablet 0  . simvastatin (ZOCOR) 40 MG tablet Take 40 mg by mouth every evening.    . terazosin (HYTRIN) 1 MG capsule Take 1 mg by mouth 2 (two) times daily.     Marland Kitchen tiotropium (SPIRIVA HANDIHALER) 18 MCG inhalation capsule Place 1 capsule (18 mcg total) into inhaler and inhale every morning. 30 capsule 12  . traZODone (DESYREL) 150 MG tablet Take 300 mg by mouth at bedtime.     . triamcinolone cream (KENALOG) 0.1 % Apply 1 application topically 2 (two) times daily as needed (for irritation).     . ziprasidone (GEODON) 80 MG capsule Take 80 mg by mouth every evening.    Marland Kitchen LORazepam (ATIVAN) 1 MG tablet Take 0.5 tablets (0.5 mg total) by mouth 2 (two) times daily as needed for anxiety. (Patient not taking: Reported on 04/25/2015) 20 tablet 0  . metoprolol (LOPRESSOR) 50 MG tablet Take 50 mg by mouth 2 (two) times daily.    . polyvinyl alcohol (LIQUIFILM TEARS) 1.4 % ophthalmic solution Place 1 drop into both eyes daily.     No facility-administered medications prior to visit.    PAST MEDICAL HISTORY: Past Medical History  Diagnosis Date  . COPD (chronic obstructive pulmonary disease) ([place])   . CHF (congestive heart failure) ([facility])     diastolic   . Hypercholesterolemia   . Depression   . Schizophrenia ([place])   . Sleep apnea     noncompliant with BiPAP  . Tobacco abuse   . On home O2     3 liters  . Chronic pain   . Diabetes mellitus without complication ([place])     insulin pump    PAST SURGICAL HISTORY: Past Surgical History  Procedure Laterality Date  . Knee surgery      X2  . 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   . Colonoscopy  July 2011    Dr. Benson [place]: multiple polyps, internal and external hemorrhoids, diverticulosis. tubular adenoma  . Esophagogastroduodenoscopy (egd) with propofol N/A 04/20/2013    Procedure: ESOPHAGOGASTRODUODENOSCOPY (EGD) WITH PROPOFOL;   Surgeon: Daneil Dolin, MD;  Location: [facility];  Service: Endoscopy;  Laterality: N/A;    FAMILY HISTORY: Family History  Problem Relation Age of Onset  . Colon cancer Neg Hx   . Parkinson's disease    . Thyroid disease Mother   . Parkinson's disease Father     SOCIAL HISTORY: Social History   Social History  . Marital Status: Married    Spouse Name: N/A  . Number of Children: N/A  . Years of Education: N/A   Occupational History  . Not on file.  Social History Main Topics  . Smoking status: Former Smoker -- 1.00 packs/day for 50 years    Types: Cigarettes    Quit date: 09/16/2013  . Smokeless tobacco: Never Used  . Alcohol Use: No     Comment: history of ETOH abuse in remote past, none in at least 10 years  . Drug Use: No  . Sexual Activity: Not on file   Other Topics Concern  . Not on file   Social History Narrative   Drinks about 4 cups of caffeine daily. 2 cups of coffee and 2 coke zeros.      PHYSICAL EXAM  Filed Vitals:   07/06/15 1421  BP: 107/65  Pulse: 70  Height: 6' (1.829 m)   There is no weight on file to calculate BMI.  Generalized: Well developed, in no acute distress  Neck: Circumference 22 inches  Neurological examination  Mentation: Alert oriented to time, place, history taking. Follows all commands speech and language fluent Cranial nerve II-XII: Pupils were equal round reactive to light. Extraocular movements were full, visual field were full on confrontational test. Facial sensation and strength were normal. Uvula tongue midline. Head turning and shoulder shrug  were normal and symmetric. Motor: The motor testing reveals 5 over 5 strength of all 4 extremities. Good symmetric motor tone is noted throughout.  Sensory: Sensory testing is intact to soft touch on all 4 extremities. No evidence of extinction is noted.  Coordination: Cerebellar testing reveals good finger-nose-finger and heel-to-shin bilaterally.  Gait and station: In a  wheelchair Reflexes: Deep tendon reflexes are symmetric and normal bilaterally.   DIAGNOSTIC DATA (LABS, IMAGING, TESTING) - I reviewed patient records, labs, notes, testing and imaging myself where available.  Lab Results  Component Value Date   WBC 7.8 12/06/2014   HGB 10.8* 12/06/2014   HCT 37.2* 12/06/2014   MCV 92.5 12/06/2014   PLT 146* 12/06/2014      Component Value Date/Time   NA 139 12/06/2014 0515   K 4.5 12/06/2014 0515   CL 98 12/06/2014 0515   CO2 36* 12/06/2014 0515   GLUCOSE 199* 12/06/2014 0515   BUN 10 12/06/2014 0515   CREATININE 0.95 12/06/2014 0515   CALCIUM 8.4 12/06/2014 0515   PROT 5.7* 12/06/2014 0515   ALBUMIN 3.2* 12/06/2014 0515   AST 15 12/06/2014 0515   ALT 11 12/06/2014 0515   ALKPHOS 70 12/06/2014 0515   BILITOT 0.7 12/06/2014 0515   GFRNONAA 40* 12/06/2014 0515   GFRAA >90 12/06/2014 0515    ASSESSMENT AND PLAN 69 y.o. year old male  has a past medical history of COPD (chronic obstructive pulmonary disease) ([place]); CHF (congestive heart failure) ([place]); Hypercholesterolemia; Depression; Schizophrenia ([place]); Sleep apnea; Tobacco abuse; On home O2; Chronic pain; and Diabetes mellitus without complication ([place]). here with:  1. Central sleep apnea 2. Obesity hypoventilation syndrome 3. Noncompliant with medical treatment  Patient continues to be noncompliant with his BiPAP machine. Patient states that he is willing to give the BiPAP another try. During the office visit today we provided him with a different mask to try at home. Patient advised that if he is unable to tolerate the new mask he should let us know. He will follow-up in 3 months for a compliance download. Patient encouraged to use his machine greater than 4 hours every night. Patient verbalized understanding.   Ward Givens, MSN, NP-C 07/06/2015, 2:55 PM [facility] [address], [place], [place] [address] [phone]

## 2015-07-06 NOTE — Patient Instructions (Signed)
Try using the new mask. If its not working call.  Use machine >4 hours each night

## 2015-08-04 DIAGNOSIS — L821 Other seborrheic keratosis: Secondary | ICD-10-CM | POA: Insufficient documentation

## 2015-08-16 ENCOUNTER — Emergency Department (HOSPITAL_COMMUNITY): Payer: Medicare Other

## 2015-08-16 ENCOUNTER — Inpatient Hospital Stay (HOSPITAL_COMMUNITY)
Admission: EM | Admit: 2015-08-16 | Discharge: 2015-08-21 | DRG: 917 | Disposition: A | Discharge status: Home / Self Care | Payer: Medicare Other | Attending: Internal Medicine | Admitting: Internal Medicine

## 2015-08-16 ENCOUNTER — Encounter (HOSPITAL_COMMUNITY): Payer: Self-pay

## 2015-08-16 DIAGNOSIS — J9622 Acute and chronic respiratory failure with hypercapnia: Secondary | ICD-10-CM | POA: Diagnosis present

## 2015-08-16 DIAGNOSIS — Z9119 Patient's noncompliance with other medical treatment and regimen: Secondary | ICD-10-CM | POA: Diagnosis not present

## 2015-08-16 DIAGNOSIS — Z794 Long term (current) use of insulin: Secondary | ICD-10-CM | POA: Diagnosis not present

## 2015-08-16 DIAGNOSIS — Z6841 Body Mass Index (BMI) 40.0 and over, adult: Secondary | ICD-10-CM

## 2015-08-16 DIAGNOSIS — J9602 Acute respiratory failure with hypercapnia: Secondary | ICD-10-CM | POA: Diagnosis not present

## 2015-08-16 DIAGNOSIS — Z7982 Long term (current) use of aspirin: Secondary | ICD-10-CM | POA: Diagnosis not present

## 2015-08-16 DIAGNOSIS — Z820 Family history of epilepsy and other diseases of the nervous system: Secondary | ICD-10-CM

## 2015-08-16 DIAGNOSIS — G8929 Other chronic pain: Secondary | ICD-10-CM | POA: Diagnosis present

## 2015-08-16 DIAGNOSIS — F329 Major depressive disorder, single episode, unspecified: Secondary | ICD-10-CM | POA: Diagnosis present

## 2015-08-16 DIAGNOSIS — E785 Hyperlipidemia, unspecified: Secondary | ICD-10-CM | POA: Diagnosis present

## 2015-08-16 DIAGNOSIS — Z79899 Other long term (current) drug therapy: Secondary | ICD-10-CM

## 2015-08-16 DIAGNOSIS — E872 Acidosis: Secondary | ICD-10-CM | POA: Diagnosis present

## 2015-08-16 DIAGNOSIS — Z9641 Presence of insulin pump (external) (internal): Secondary | ICD-10-CM | POA: Diagnosis present

## 2015-08-16 DIAGNOSIS — Z8673 Personal history of transient ischemic attack (TIA), and cerebral infarction without residual deficits: Secondary | ICD-10-CM | POA: Diagnosis not present

## 2015-08-16 DIAGNOSIS — Z87891 Personal history of nicotine dependence: Secondary | ICD-10-CM

## 2015-08-16 DIAGNOSIS — G92 Toxic encephalopathy: Secondary | ICD-10-CM | POA: Diagnosis present

## 2015-08-16 DIAGNOSIS — E662 Morbid (severe) obesity with alveolar hypoventilation: Secondary | ICD-10-CM | POA: Diagnosis present

## 2015-08-16 DIAGNOSIS — Z79891 Long term (current) use of opiate analgesic: Secondary | ICD-10-CM

## 2015-08-16 DIAGNOSIS — Z9981 Dependence on supplemental oxygen: Secondary | ICD-10-CM | POA: Diagnosis not present

## 2015-08-16 DIAGNOSIS — J449 Chronic obstructive pulmonary disease, unspecified: Secondary | ICD-10-CM | POA: Diagnosis present

## 2015-08-16 DIAGNOSIS — I10 Essential (primary) hypertension: Secondary | ICD-10-CM | POA: Diagnosis present

## 2015-08-16 DIAGNOSIS — J9600 Acute respiratory failure, unspecified whether with hypoxia or hypercapnia: Secondary | ICD-10-CM | POA: Diagnosis present

## 2015-08-16 DIAGNOSIS — E7800 Pure hypercholesterolemia, unspecified: Secondary | ICD-10-CM | POA: Diagnosis present

## 2015-08-16 DIAGNOSIS — H5441 Blindness, right eye, normal vision left eye: Secondary | ICD-10-CM | POA: Diagnosis present

## 2015-08-16 DIAGNOSIS — J690 Pneumonitis due to inhalation of food and vomit: Secondary | ICD-10-CM

## 2015-08-16 DIAGNOSIS — G9340 Encephalopathy, unspecified: Secondary | ICD-10-CM | POA: Diagnosis present

## 2015-08-16 DIAGNOSIS — J9690 Respiratory failure, unspecified, unspecified whether with hypoxia or hypercapnia: Secondary | ICD-10-CM

## 2015-08-16 DIAGNOSIS — G4733 Obstructive sleep apnea (adult) (pediatric): Secondary | ICD-10-CM | POA: Diagnosis present

## 2015-08-16 DIAGNOSIS — T50902A Poisoning by unspecified drugs, medicaments and biological substances, intentional self-harm, initial encounter: Secondary | ICD-10-CM

## 2015-08-16 DIAGNOSIS — E119 Type 2 diabetes mellitus without complications: Secondary | ICD-10-CM | POA: Diagnosis present

## 2015-08-16 DIAGNOSIS — T424X1A Poisoning by benzodiazepines, accidental (unintentional), initial encounter: Principal | ICD-10-CM | POA: Diagnosis present

## 2015-08-16 DIAGNOSIS — J9610 Chronic respiratory failure, unspecified whether with hypoxia or hypercapnia: Secondary | ICD-10-CM | POA: Diagnosis present

## 2015-08-16 DIAGNOSIS — R5383 Other fatigue: Secondary | ICD-10-CM | POA: Diagnosis present

## 2015-08-16 DIAGNOSIS — J9611 Chronic respiratory failure with hypoxia: Secondary | ICD-10-CM | POA: Diagnosis not present

## 2015-08-16 DIAGNOSIS — Z7902 Long term (current) use of antithrombotics/antiplatelets: Secondary | ICD-10-CM

## 2015-08-16 DIAGNOSIS — F200 Paranoid schizophrenia: Secondary | ICD-10-CM | POA: Diagnosis present

## 2015-08-16 DIAGNOSIS — T424X4A Poisoning by benzodiazepines, undetermined, initial encounter: Secondary | ICD-10-CM | POA: Diagnosis not present

## 2015-08-16 HISTORY — DX: Blindness, one eye, unspecified eye: H54.40

## 2015-08-16 LAB — BLOOD GAS, ARTERIAL
ACID-BASE EXCESS: 11.3 mmol/L — AB (ref 0.0–2.0)
Acid-Base Excess: 12.2 mmol/L — ABNORMAL HIGH (ref 0.0–2.0)
Bicarbonate: 33.5 mEq/L — ABNORMAL HIGH (ref 20.0–24.0)
Bicarbonate: 32.6 mEq/L — ABNORMAL HIGH (ref 20.0–24.0)
DRAWN BY: 25788
Drawn by: 25788
FIO2: 50
LHR: 16 {breaths}/min
O2 CONTENT: 3 L/min
O2 SAT: 89.6 %
O2 SAT: 87.6 %
PEEP: 5 cmH2O
VT: 500 mL
pCO2 arterial: 74.9 mmHg (ref 35.0–45.0)
pCO2 arterial: 67.3 mmHg (ref 35.0–45.0)
pH, Arterial: 7.33 — ABNORMAL LOW (ref 7.350–7.450)
pH, Arterial: 7.36 (ref 7.350–7.450)
pO2, Arterial: 62.6 mmHg — ABNORMAL LOW (ref 80.0–100.0)
pO2, Arterial: 58.3 mmHg — ABNORMAL LOW (ref 80.0–100.0)

## 2015-08-16 LAB — COMPREHENSIVE METABOLIC PANEL
ALT: 11 U/L — ABNORMAL LOW (ref 17–63)
AST: 10 U/L — ABNORMAL LOW (ref 15–41)
Albumin: 3.9 g/dL (ref 3.5–5.0)
Alkaline Phosphatase: 70 U/L (ref 38–126)
Anion gap: 9 (ref 5–15)
BUN: 9 mg/dL (ref 6–20)
CHLORIDE: 90 mmol/L — AB (ref 101–111)
CO2: 37 mmol/L — ABNORMAL HIGH (ref 22–32)
CREATININE: 0.89 mg/dL (ref 0.61–1.24)
Calcium: 8.9 mg/dL (ref 8.9–10.3)
GLUCOSE: 289 mg/dL — AB (ref 65–99)
POTASSIUM: 5 mmol/L (ref 3.5–5.1)
Sodium: 136 mmol/L (ref 135–145)
TOTAL PROTEIN: 7.2 g/dL (ref 6.5–8.1)
Total Bilirubin: 0.7 mg/dL (ref 0.3–1.2)

## 2015-08-16 LAB — RAPID URINE DRUG SCREEN, HOSP PERFORMED
Amphetamines: NOT DETECTED
BARBITURATES: NOT DETECTED
BENZODIAZEPINES: POSITIVE — AB
Cocaine: NOT DETECTED
Opiates: POSITIVE — AB
Tetrahydrocannabinol: NOT DETECTED

## 2015-08-16 LAB — CBC WITH DIFFERENTIAL/PLATELET
BASOS ABS: 0 10*3/uL (ref 0.0–0.1)
Basophils Relative: 0 %
EOS PCT: 2 %
Eosinophils Absolute: 0.2 10*3/uL (ref 0.0–0.7)
HEMATOCRIT: 42.5 % (ref 39.0–52.0)
Hemoglobin: 12.8 g/dL — ABNORMAL LOW (ref 13.0–17.0)
LYMPHS PCT: 11 %
Lymphs Abs: 1.3 10*3/uL (ref 0.7–4.0)
MCH: 27.4 pg (ref 26.0–34.0)
MCHC: 30.1 g/dL (ref 30.0–36.0)
MCV: 91 fL (ref 78.0–100.0)
MONO ABS: 0.5 10*3/uL (ref 0.1–1.0)
MONOS PCT: 4 %
NEUTROS ABS: 9.7 10*3/uL — AB (ref 1.7–7.7)
Neutrophils Relative %: 83 %
PLATELETS: 188 10*3/uL (ref 150–400)
RBC: 4.67 MIL/uL (ref 4.22–5.81)
RDW: 12.8 % (ref 11.5–15.5)
WBC: 11.8 10*3/uL — ABNORMAL HIGH (ref 4.0–10.5)

## 2015-08-16 LAB — GLUCOSE, CAPILLARY
GLUCOSE-CAPILLARY: 300 mg/dL — AB (ref 65–99)
GLUCOSE-CAPILLARY: 283 mg/dL — AB (ref 65–99)

## 2015-08-16 LAB — URINALYSIS, ROUTINE W REFLEX MICROSCOPIC
BILIRUBIN URINE: NEGATIVE
Glucose, UA: 250 mg/dL — AB
Hgb urine dipstick: NEGATIVE
KETONES UR: NEGATIVE mg/dL
LEUKOCYTES UA: NEGATIVE
NITRITE: NEGATIVE
PH: 7 (ref 5.0–8.0)
PROTEIN: NEGATIVE mg/dL
Specific Gravity, Urine: <1.005 — ABNORMAL LOW (ref 1.005–1.030)

## 2015-08-16 LAB — SALICYLATE LEVEL

## 2015-08-16 LAB — ACETAMINOPHEN LEVEL

## 2015-08-16 LAB — ETHANOL

## 2015-08-16 LAB — MRSA PCR SCREENING: MRSA BY PCR: NEGATIVE

## 2015-08-16 LAB — STREP PNEUMONIAE URINARY ANTIGEN: STREP PNEUMO URINARY ANTIGEN: NEGATIVE

## 2015-08-16 LAB — LACTIC ACID, PLASMA: Lactic Acid, Venous: 0.9 mmol/L (ref 0.5–2.0)

## 2015-08-16 MED ORDER — PANTOPRAZOLE SODIUM 40 MG IV SOLR
40.0000 mg | Freq: Every day | INTRAVENOUS | Status: DC
  Administered 2015-08-16 – 2015-08-21 (×6): 40 mg via INTRAVENOUS
  Filled 2015-08-16 (×6): qty 40

## 2015-08-16 MED ORDER — ASPIRIN 81 MG PO CHEW
81.0000 mg | CHEWABLE_TABLET | Freq: Every morning | ORAL | Status: DC
Start: 2015-08-17 — End: 2015-08-21
  Administered 2015-08-17 – 2015-08-21 (×5): 81 mg
  Filled 2015-08-16 (×5): qty 1

## 2015-08-16 MED ORDER — ACETAMINOPHEN 325 MG PO TABS
650.0000 mg | ORAL_TABLET | Freq: Four times a day (QID) | ORAL | Status: DC | PRN
  Administered 2015-08-19: 650 mg via ORAL
  Filled 2015-08-16: qty 2

## 2015-08-16 MED ORDER — ROCURONIUM BROMIDE 50 MG/5ML IV SOLN
1.0000 mg/kg | Freq: Once | INTRAVENOUS | Status: AC
  Administered 2015-08-16: 136.1 mg via INTRAVENOUS
  Filled 2015-08-16: qty 13.61

## 2015-08-16 MED ORDER — ALBUTEROL SULFATE (2.5 MG/3ML) 0.083% IN NEBU
2.5000 mg | INHALATION_SOLUTION | RESPIRATORY_TRACT | Status: DC | PRN
Start: 2015-08-16 — End: 2015-08-21
  Administered 2015-08-18: 2.5 mg via RESPIRATORY_TRACT

## 2015-08-16 MED ORDER — NALOXONE HCL 0.4 MG/ML IJ SOLN
2.0000 mg | Freq: Once | INTRAMUSCULAR | Status: AC
  Administered 2015-08-16: 2 mg via INTRAVENOUS
  Filled 2015-08-16: qty 5

## 2015-08-16 MED ORDER — FENTANYL CITRATE (PF) 100 MCG/2ML IJ SOLN
50.0000 ug | INTRAMUSCULAR | Status: DC | PRN
  Filled 2015-08-16: qty 2

## 2015-08-16 MED ORDER — SODIUM CHLORIDE 0.9 % IV SOLN
INTRAVENOUS | Status: DC
  Administered 2015-08-17 – 2015-08-18 (×2): via INTRAVENOUS

## 2015-08-16 MED ORDER — TIOTROPIUM BROMIDE MONOHYDRATE 18 MCG IN CAPS
18.0000 ug | ORAL_CAPSULE | Freq: Every morning | RESPIRATORY_TRACT | Status: DC
  Administered 2015-08-20 – 2015-08-21 (×2): 18 ug via RESPIRATORY_TRACT
  Filled 2015-08-16: qty 5

## 2015-08-16 MED ORDER — FENTANYL CITRATE (PF) 100 MCG/2ML IJ SOLN
50.0000 ug | INTRAMUSCULAR | Status: AC | PRN
  Administered 2015-08-16 – 2015-08-17 (×3): 50 ug via INTRAVENOUS
  Filled 2015-08-16 (×2): qty 2

## 2015-08-16 MED ORDER — ANTISEPTIC ORAL RINSE SOLUTION (CORINZ)
7.0000 mL | Freq: Four times a day (QID) | OROMUCOSAL | Status: DC
  Administered 2015-08-16 – 2015-08-19 (×13): 7 mL via OROMUCOSAL

## 2015-08-16 MED ORDER — SODIUM CHLORIDE 0.9 % IJ SOLN
3.0000 mL | Freq: Two times a day (BID) | INTRAMUSCULAR | Status: DC
  Administered 2015-08-16 – 2015-08-21 (×10): 3 mL via INTRAVENOUS

## 2015-08-16 MED ORDER — PIPERACILLIN-TAZOBACTAM 3.375 G IVPB 30 MIN
3.3750 g | Freq: Once | INTRAVENOUS | Status: AC
  Administered 2015-08-16: 3.375 g via INTRAVENOUS
  Filled 2015-08-16: qty 50

## 2015-08-16 MED ORDER — MIDAZOLAM HCL 2 MG/2ML IJ SOLN
1.0000 mg | INTRAMUSCULAR | Status: AC | PRN
  Administered 2015-08-17 (×3): 1 mg via INTRAVENOUS
  Filled 2015-08-16: qty 2

## 2015-08-16 MED ORDER — ACETAMINOPHEN 650 MG RE SUPP: 650.0000 mg | Freq: Four times a day (QID) | RECTAL | Status: DC | PRN

## 2015-08-16 MED ORDER — CHLORHEXIDINE GLUCONATE 0.12% ORAL RINSE (MEDLINE KIT)
15.0000 mL | Freq: Two times a day (BID) | OROMUCOSAL | Status: DC
  Administered 2015-08-16 – 2015-08-19 (×6): 15 mL via OROMUCOSAL

## 2015-08-16 MED ORDER — SODIUM CHLORIDE 0.9 % IV SOLN
Freq: Once | INTRAVENOUS | Status: AC
  Administered 2015-08-16: 10:00:00 via INTRAVENOUS

## 2015-08-16 MED ORDER — PIPERACILLIN-TAZOBACTAM 3.375 G IVPB
3.3750 g | Freq: Three times a day (TID) | INTRAVENOUS | Status: DC
  Administered 2015-08-16 – 2015-08-17 (×2): 3.375 g via INTRAVENOUS
  Filled 2015-08-16 (×3): qty 50

## 2015-08-16 MED ORDER — MIDAZOLAM HCL 2 MG/2ML IJ SOLN
1.0000 mg | INTRAMUSCULAR | Status: DC | PRN
  Administered 2015-08-17: 1 mg via INTRAVENOUS
  Filled 2015-08-16 (×2): qty 2

## 2015-08-16 MED ORDER — ENOXAPARIN SODIUM 40 MG/0.4ML ~~LOC~~ SOLN
40.0000 mg | SUBCUTANEOUS | Status: DC
  Administered 2015-08-16 – 2015-08-20 (×5): 40 mg via SUBCUTANEOUS
  Filled 2015-08-16 (×5): qty 0.4

## 2015-08-16 MED ORDER — ETOMIDATE 2 MG/ML IV SOLN
20.0000 mg | Freq: Once | INTRAVENOUS | Status: AC
  Administered 2015-08-16: 10 mg via INTRAVENOUS
  Filled 2015-08-16: qty 10

## 2015-08-16 MED ORDER — INSULIN ASPART 100 UNIT/ML ~~LOC~~ SOLN
0.0000 [IU] | SUBCUTANEOUS | Status: DC
Start: 2015-08-16 — End: 2015-08-17
  Administered 2015-08-16 (×2): 5 [IU] via SUBCUTANEOUS
  Administered 2015-08-17: 3 [IU] via SUBCUTANEOUS
  Administered 2015-08-17: 5 [IU] via SUBCUTANEOUS

## 2015-08-16 NOTE — ED Notes (Signed)
Patient snoring, patient with partial airway obstruction due to tongue/airway positioning.NPA placed in right nare with resolution of snoring, breathing effort decreased.

## 2015-08-16 NOTE — ED Notes (Signed)
Patient moving head around and arms in bed. Reoriented patient. Fentanyl given per PRN order for agitation/sedation.

## 2015-08-16 NOTE — ED Notes (Signed)
Dr Sarajane Jews at bedside for admission assessment. RN cleared with MD that patient could wait for head CT until ready to transport to ICU to minimize bed/bed transfers with extensive patient equipment. MD cleared to wait for head CT.

## 2015-08-16 NOTE — ED Notes (Signed)
Preparing for intubation at this time. Dr Reather Converse at bedside.

## 2015-08-16 NOTE — ED Notes (Signed)
Patient with gurgling respirations, attempting to cough without success. Deep suctioning done by RN and RT 2-3 times. Patient then breathing easier after suctioning.  Large amount of secretions removed.

## 2015-08-16 NOTE — ED Notes (Signed)
Critical PCO2 given to Dr Reather Converse

## 2015-08-16 NOTE — ED Notes (Signed)
Patient with secretions in mouth. Suctioned.

## 2015-08-16 NOTE — Progress Notes (Signed)
ANTIBIOTIC CONSULT NOTE - INITIAL  Pharmacy Consult for zosyn Indication: rule out pneumonia  Allergies  Allergen Reactions  . Phenergan [Promethazine Hcl] Other (See Comments)    Becomes very confused and aggressive  . Haldol [Haloperidol Lactate] Other (See Comments)    Shaking   . Metformin And Related Diarrhea    Patient Measurements: Height: 6' (182.9 cm) Weight: 300 lb (136.079 kg) IBW/kg (Calculated) : 77.6   Vital Signs: Temp: 97.3 F (36.3 C) (11/29 1208) Temp Source: Rectal (11/29 1208) BP: 131/90 mmHg (11/29 1400) Pulse Rate: 120 (11/29 1345) Intake/Output from previous day:   Intake/Output from this shift: Total I/O In: -  Out: 500 [Urine:500]  Labs:  Recent Labs  08/16/15 1006  WBC 11.8*  HGB 12.8*  PLT 188  CREATININE 0.89   Estimated Creatinine Clearance: 113.5 mL/min (by C-G formula based on Cr of 0.89). No results for input(s): VANCOTROUGH, VANCOPEAK, VANCORANDOM, GENTTROUGH, GENTPEAK, GENTRANDOM, TOBRATROUGH, TOBRAPEAK, TOBRARND, AMIKACINPEAK, AMIKACINTROU, AMIKACIN in the last 72 hours.   Microbiology: No results found for this or any previous visit (from the past 720 hour(s)).  Medical History: Past Medical History  Diagnosis Date  . COPD (chronic obstructive pulmonary disease) ([place])   . CHF (congestive heart failure) ([facility])     diastolic   . Hypercholesterolemia   . Depression   . Schizophrenia ([place])   . Sleep apnea     noncompliant with BiPAP  . Tobacco abuse   . On home O2     3 liters  . Chronic pain   . Diabetes mellitus without complication ([facility])     insulin pump  . 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 aspirin 81 MG chewable tablet Chew 81 mg by mouth every morning.    08/15/2015 at Unknown time  . cholecalciferol (VITAMIN D) 1000 UNITS tablet Take 2,000 Units by mouth daily.   08/15/2015 at Unknown time  . Cinnamon 500  MG capsule Take 1,000 mg by mouth every morning.    08/15/2015 at Unknown time  . citalopram (CELEXA) 20 MG tablet Take 20 mg by mouth every morning.   08/15/2015 at Unknown time  . clopidogrel (PLAVIX) 75 MG tablet Take 75 mg by mouth every morning.   08/15/2015 at Unknown time  . furosemide (LASIX) 40 MG tablet Take 1 tablet (40 mg total) by mouth daily. 30 tablet 1 08/15/2015 at Unknown time  . gabapentin (NEURONTIN) 300 MG capsule Take 300 mg by mouth 4 (four) times daily.    08/15/2015 at Unknown time  . Insulin Human (INSULIN PUMP) SOLN Inject into the skin continuous. Humalog   08/16/2015 at Unknown time  . LORazepam (ATIVAN) 1 MG tablet Take 0.5 tablets (0.5 mg total) by mouth 2 (two) times daily as needed for anxiety. 20 tablet 0 08/15/2015 at Unknown time  . metoprolol (LOPRESSOR) 50 MG tablet Take 50 mg by mouth 2 (two) times daily.   08/15/2015 at Unknown time  . pantoprazole (PROTONIX) 40 MG tablet Take 40 mg by mouth 2 (two) times daily.   08/15/2015 at Unknown time  . simvastatin (ZOCOR) 40 MG tablet Take 40 mg by mouth every evening.   08/15/2015 at Unknown time  . sodium chloride 1 G tablet Take 1 g by mouth 2 (two) times daily.   08/15/2015 at Unknown time  . terazosin (HYTRIN) 1 MG capsule Take 1 mg by  mouth 2 (two) times daily.    08/15/2015 at Unknown time  . tiotropium (SPIRIVA HANDIHALER) 18 MCG inhalation capsule Place 1 capsule (18 mcg total) into inhaler and inhale every morning. 30 capsule 12 08/15/2015 at Unknown time  . traZODone (DESYREL) 150 MG tablet Take 300 mg by mouth at bedtime.    08/15/2015 at Unknown time  . ziprasidone (GEODON) 80 MG capsule Take 80 mg by mouth every evening.   08/15/2015 at Unknown time  . albuterol (PROVENTIL HFA;VENTOLIN HFA) 108 (90 BASE) MCG/ACT inhaler Inhale 2 puffs into the lungs every 6 (six) hours as needed for wheezing or shortness of breath. 1 Inhaler 2 unknown  . albuterol (PROVENTIL) (2.5 MG/3ML) 0.083% nebulizer solution Take 3  mLs (2.5 mg total) by nebulization every 6 (six) hours as needed for wheezing or shortness of breath. 75 mL 0 unknown  . HYDROcodone-acetaminophen (NORCO/VICODIN) 5-325 MG per tablet Take 1 tablet by mouth every 4 (four) hours as needed for moderate pain.    unknown  . Tetrahydrozoline HCl (VISINE OP) Apply 1 drop to eye daily as needed (irritation).    unknown  . triamcinolone cream (KENALOG) 0.1 % Apply 1 application topically 2 (two) times daily as needed (for irritation).    unknown   Assessment: 69 yo man admitted with suspected ativan overdose.  He will start zosyn for ? Asp PNA.    Goal of Therapy:  Eradication of infection  Plan:  Zosyn 3.375 gm IV q8 hours F/u renal function, cultures and clinical course  Thanks for allowing pharmacy to be a part of this patient's care.  Excell Seltzer, PharmD Clinical Pharmacist 08/16/2015,3:51 PM

## 2015-08-16 NOTE — H&P (Signed)
History and Physical  Rick Cruz [id number] DOB: 04-04-1946 DOA: 08/16/2015  Referring physician: Elnora Morrison, MD PCP: [name], MD   Chief Complaint: Acute encephalopathy and possible overdose.    HPI:  44 yom with hx schizophrenia and depression presented to the ED for lethargy and suspected Ativan overdose. Intubated in ED for inability to protect airway.  History obtained from wife. Pt saw PCP yesterday and was given Rx for Ativan #60. Not recently on Ativan.  Early AM he called for help and when she responded she noticed he was undressed, diaphoretic, sitting in chair, had urinated on self. He reported taking one Ativan. He was able to wake up and get back in bed but then later was unarousable. Wife found the Ativan bottle, was empty with no pills found anywhere. Presumed overdose on entire bottle.   She doubts SI, he has a history of accidentally overdosing and abuse of multiple substances. He does properly take his hydrocodone and is off fentanyl patch. She denies any reports of fever, rashes, muscle aches, nausea, vomiting, abdominal pain, dysuria.   He is noncompliant with BiPAP at night. Mother reports that when his oxygen gets low he exhibits acute encephalopathy. He was not wearing his oxygen last night.   While in the ED he was noted to be lethargic and concern for protecting his airway, so he was iIntubated. CXR revealed RLL pneumonia. Admitted for further evaluation and treatment.   In the emergency department Afebrile, VSS, Intubated.  Pertinent labs: ABG 74.9/62.6/33.5; CMP unremarkable; WBC 11.8; Drug screen positive for opiates and benzodiazepines; UA unremarkable. Glucose 289; AST/ALT unremarkable.  EKG: Independently reviewed. pending Imaging: independently reviewed. CXR RLL infiltrate concerning for pneumonia.     Review of Systems:  Unable to obtain due to patient's mental status   Past Medical History  Diagnosis Date  . COPD (chronic obstructive  pulmonary disease) ([place])   . CHF (congestive heart failure) (HCC)     diastolic   . Hypercholesterolemia   . Depression   . Schizophrenia ([place])   . Sleep apnea     noncompliant with BiPAP  . Tobacco abuse   . On home O2     3 liters  . Chronic pain   . Diabetes mellitus without complication (HCC)     insulin pump    Past Surgical History  Procedure Laterality Date  . Knee surgery      X2  . 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   . Colonoscopy  July 2011    Dr. Benson Norway: multiple polyps, internal and external hemorrhoids, diverticulosis. tubular adenoma  . Esophagogastroduodenoscopy (egd) with propofol N/A 04/20/2013    Procedure: ESOPHAGOGASTRODUODENOSCOPY (EGD) WITH PROPOFOL;  Surgeon: Daneil Dolin, MD;  Location: [facility];  Service: Endoscopy;  Laterality: N/A;    Social History:  reports that he quit smoking about 22 months ago. His smoking use included Cigarettes. He has a 50 pack-year smoking history. He has never used smokeless tobacco. He reports that he does not drink alcohol or use illicit drugs. lives with their spouse Self-care  Allergies  Allergen Reactions  . Phenergan [Promethazine Hcl] Other (See Comments)    Becomes very confused and aggressive  . Haldol [Haloperidol Lactate] Other (See Comments)    Shaking   . Metformin And Related Diarrhea    Family History  Problem Relation Age of Onset  . Colon cancer Neg Hx   .  Parkinson's disease    . Thyroid disease Mother   . Parkinson's disease Father      Prior to Admission medications   Medication Sig Start Date End Date Taking? Authorizing Provider  albuterol (PROVENTIL HFA;VENTOLIN HFA) 108 (90 BASE) MCG/ACT inhaler Inhale 2 puffs into the lungs every 6 (six) hours as needed for wheezing or shortness of breath. 05/11/14   Radene Gunning, NP  albuterol (PROVENTIL) (2.5 MG/3ML) 0.083% nebulizer solution Take 3 mLs (2.5 mg  total) by nebulization every 6 (six) hours as needed for wheezing or shortness of breath. 05/11/14   Radene Gunning, NP  aspirin 81 MG chewable tablet Chew 81 mg by mouth every morning.     Historical Provider, MD  cholecalciferol (VITAMIN D) 1000 UNITS tablet Take 2,000 Units by mouth daily.    Historical Provider, MD  Cinnamon 500 MG capsule Take 1,000 mg by mouth every morning.     Historical Provider, MD  citalopram (CELEXA) 20 MG tablet Take 20 mg by mouth every morning.    Historical Provider, MD  clopidogrel (PLAVIX) 75 MG tablet Take 75 mg by mouth every morning.    Historical Provider, MD  furosemide (LASIX) 40 MG tablet Take 1 tablet (40 mg total) by mouth daily. 08/10/14   Kathie Dike, MD  gabapentin (NEURONTIN) 300 MG capsule Take 300 mg by mouth 4 (four) times daily.     Historical Provider, MD  guaiFENesin (MUCINEX) 600 MG 12 hr tablet Take 1 tablet (600 mg total) by mouth 2 (two) times daily. 08/10/14   Kathie Dike, MD  HYDROcodone-acetaminophen (NORCO/VICODIN) 5-325 MG per tablet Take 1 tablet by mouth every 4 (four) hours as needed for moderate pain.     Historical Provider, MD  Insulin Human (INSULIN PUMP) SOLN Inject into the skin continuous. Humalog    Historical Provider, MD  LORazepam (ATIVAN) 1 MG tablet Take 0.5 tablets (0.5 mg total) by mouth 2 (two) times daily as needed for anxiety. Patient not taking: Reported on 04/25/2015 08/10/14   Kathie Dike, MD  metoprolol succinate (TOPROL-XL) 25 MG 24 hr tablet Take 25 mg by mouth 2 (two) times daily.    Historical Provider, MD  pantoprazole (PROTONIX) 40 MG tablet Take 40 mg by mouth 2 (two) times daily.    Historical Provider, MD  pantoprazole (PROTONIX) 40 MG tablet Take 40 mg by mouth 2 (two) times daily.    Historical Provider, MD  predniSONE (DELTASONE) 10 MG tablet Take 40mg  po daily for 2 days then 30mg  po daily for 2 days then 20mg  po daily for 2 days then 10mg  po daily for 2 days then stop 08/10/14   Kathie Dike,  MD  simvastatin (ZOCOR) 40 MG tablet Take 40 mg by mouth every evening.    Historical Provider, MD  terazosin (HYTRIN) 1 MG capsule Take 1 mg by mouth 2 (two) times daily.     Historical Provider, MD  Tetrahydrozoline HCl (VISINE OP) Apply to eye as needed.    Historical Provider, MD  tiotropium (SPIRIVA HANDIHALER) 18 MCG inhalation capsule Place 1 capsule (18 mcg total) into inhaler and inhale every morning. 05/11/14   Radene Gunning, NP  traZODone (DESYREL) 150 MG tablet Take 300 mg by mouth at bedtime.     Historical Provider, MD  triamcinolone cream (KENALOG) 0.1 % Apply 1 application topically 2 (two) times daily as needed (for irritation).     Historical Provider, MD  ziprasidone (GEODON) 80 MG capsule Take 80 mg by mouth  every evening.    Historical Provider, MD   Physical Exam: Filed Vitals:   08/16/15 1004 08/16/15 1112 08/16/15 1130 08/16/15 1140  BP: 139/91 140/83 146/94   Pulse: 104 103 129 116  Temp:      TempSrc:      Resp: 17 18    Height:      Weight:      SpO2: 99% 94% 89% 100%    General:  Sedated Eyes: PERRL, normal lids, irises & conjunctiva ENT: Limited, intubated. Appears normal  Neck: no LAD, masses or thyromegaly Cardiovascular: RRR, no m/r/g. No LE edema. Respiratory: Decreased breath sounds, lateral wheezes. Tolerating vent well. Abdomen: soft, ntnd Skin: Dry otherwise appears normal  Musculoskeletal: Not assessable  Psychiatric: Not assessable Neurologic: Not assessable  Wt Readings from Last 3 Encounters:  08/16/15 136.079 kg (300 lb)  04/05/15 139.254 kg (307 lb)  02/16/15 145.151 kg (320 lb)    Labs on Admission:  Basic Metabolic Panel:  Recent Labs Lab 08/16/15 1006  NA 136  K 5.0  CL 90*  CO2 37*  GLUCOSE 289*  BUN 9  CREATININE 0.89  CALCIUM 8.9    Liver Function Tests:  Recent Labs Lab 08/16/15 1006  AST 10*  ALT 11*  ALKPHOS 70  BILITOT 0.7  PROT 7.2  ALBUMIN 3.9    CBC:  Recent Labs Lab 08/16/15 1006  WBC  11.8*  NEUTROABS 9.7*  HGB 12.8*  HCT 42.5  MCV 91.0  PLT 188     Principal Problem:   Acute respiratory failure with hypercapnia (HCC) Active Problems:   Obstructive sleep apnea   Paranoid schizophrenia ([place])   DM (diabetes mellitus) ([place])   Chronic respiratory failure 02 dep with hypercarbia   Morbid obesity ([place])   Acute encephalopathy   COPD (chronic obstructive pulmonary disease) (HCC)   Obesity hypoventilation syndrome (HCC)   Aspiration pneumonia (HCC)   Lorazepam overdose   Assessment/Plan 1. Ativan overdose, intention unknown with resultant resp failure, encephalopathy and aspiration pneumonia. Lactic acid normal, no evidence of sepsis 2. VDRF, Acute hypercapnic resp failure, resp acidosis secondary to Ativan overdose, complicated by oxygen-dependent COPD, OSA non-compliant with BiPAP 3. Suspected RLL/lobar aspiration pneumonia 4. Acute encephalopathy  5. COPD, chronic hypoxic resp failure. COPD appears stable. 6. DM on insulin pump, removed. 7. Depression, schizophrenia, PMH addiction, alcohol, pain meds. Alcohol remote. No recent BZD use until yesterday. 8. Chronic pain on hydrocode/APAP. Acetaminophen level normal. 9. OSA, non-compliant    Admit to ICU. Critically ill.   Vent protocol, pulm consultation, sedation protocol. No evidence of sepsis.   Empiric abx, bronchodilators, blood cultures  SSI  D/w poison control--supportive care  Code Status: Full   DVT prophylaxis:SCD Family Communication: Wife and mother bedside.  Disposition Plan/Anticipated LOS: Admitted to the ICU. Anticipate 4-5 day stay.   Time spent: 60 minutes  Murray Hodgkins, MD  [facility] Pager [phone] 08/16/2015, 11:44 AM    By signing my name below, I, Rennis Harding attest that this documentation has been prepared under the direction and in the presence of Murray Hodgkins, MD Electronically signed: Rennis Harding  08/16/2015 12:14  I personally performed the  services described in this documentation. All medical record entries made by the scribe were at my direction. I have reviewed the chart and agree that the record reflects my personal performance and is accurate and complete. Murray Hodgkins, MD

## 2015-08-16 NOTE — ED Notes (Signed)
Per EMS, called out for possible overdose. States they were tod by th patients wife that he got 60 Ativan pills last night. States wife said she could not find pill bottle. Pt was given 2 mg narcan by EMS

## 2015-08-16 NOTE — ED Provider Notes (Signed)
CSN: [id number]     Arrival date & time 08/16/15  0906 History  By signing my name below, I, Eustaquio Maize, attest that this documentation has been prepared under the direction and in the presence of Elnora Morrison, MD. Electronically Signed: Eustaquio Maize, ED Scribe. 08/16/2015. 11:33 AM.  Chief Complaint  Patient presents with  . Drug Overdose   LEVEL 5 CAVEAT for altered mental status   The history is provided by the EMS personnel. No language interpreter was used.     HPI Comments: Rick Cruz is a 69 y.o. male brought in by ambulance, with hx schizophrenia and depression who presents to the Emergency Department for possible drug overdose. Wife called EMS this morning for possible drug overdose. Pt had 60 Ativan filled last night and wife told EMS that the bottle is now empty. She did not see any on the floor either. Per EMS, wife told them that pt called her last night stating he needed "help." When wife came home pt was diaphoretic and unclothed. His insulin pump was also removed. Pt's glucose level en route was 210. Pt also has hydrocodone and fentanyl prescribed to him but EMS did not see any patches on pt.   Past Medical History  Diagnosis Date  . COPD (chronic obstructive pulmonary disease) ([place])   . CHF (congestive heart failure) (HCC)     diastolic   . Hypercholesterolemia   . Depression   . Schizophrenia ([place])   . Sleep apnea     noncompliant with BiPAP  . Tobacco abuse   . On home O2     3 liters  . Chronic pain   . Diabetes mellitus without complication (HCC)     insulin pump   Past Surgical History  Procedure Laterality Date  . Knee surgery      X2  . 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   . Colonoscopy  July 2011    Dr. Benson Norway: multiple polyps, internal and external hemorrhoids, diverticulosis. tubular adenoma  . Esophagogastroduodenoscopy (egd) with propofol N/A 04/20/2013     Procedure: ESOPHAGOGASTRODUODENOSCOPY (EGD) WITH PROPOFOL;  Surgeon: Daneil Dolin, MD;  Location: [facility];  Service: Endoscopy;  Laterality: N/A;   Family History  Problem Relation Age of Onset  . Colon cancer Neg Hx   . Parkinson's disease    . Thyroid disease Mother   . Parkinson's disease Father    Social History  Substance Use Topics  . Smoking status: Former Smoker -- 1.00 packs/day for 50 years    Types: Cigarettes    Quit date: 09/16/2013  . Smokeless tobacco: Never Used  . Alcohol Use: No     Comment: history of ETOH abuse in remote past, none in at least 10 years    Review of Systems  Unable to perform ROS: Mental status change      Allergies  Phenergan; Haldol; and Metformin and related  Home Medications   Prior to Admission medications   Medication Sig Start Date End Date Taking? Authorizing Provider  albuterol (PROVENTIL HFA;VENTOLIN HFA) 108 (90 BASE) MCG/ACT inhaler Inhale 2 puffs into the lungs every 6 (six) hours as needed for wheezing or shortness of breath. 05/11/14   Radene Gunning, NP  albuterol (PROVENTIL) (2.5 MG/3ML) 0.083% nebulizer solution Take 3 mLs (2.5 mg total) by nebulization every 6 (six) hours as needed for wheezing or shortness of breath. 05/11/14  Radene Gunning, NP  aspirin 81 MG chewable tablet Chew 81 mg by mouth every morning.     Historical Provider, MD  cholecalciferol (VITAMIN D) 1000 UNITS tablet Take 2,000 Units by mouth daily.    Historical Provider, MD  Cinnamon 500 MG capsule Take 1,000 mg by mouth every morning.     Historical Provider, MD  citalopram (CELEXA) 20 MG tablet Take 20 mg by mouth every morning.    Historical Provider, MD  clopidogrel (PLAVIX) 75 MG tablet Take 75 mg by mouth every morning.    Historical Provider, MD  furosemide (LASIX) 40 MG tablet Take 1 tablet (40 mg total) by mouth daily. 08/10/14   Kathie Dike, MD  gabapentin (NEURONTIN) 300 MG capsule Take 300 mg by mouth 4 (four) times daily.      Historical Provider, MD  guaiFENesin (MUCINEX) 600 MG 12 hr tablet Take 1 tablet (600 mg total) by mouth 2 (two) times daily. 08/10/14   Kathie Dike, MD  HYDROcodone-acetaminophen (NORCO/VICODIN) 5-325 MG per tablet Take 1 tablet by mouth every 4 (four) hours as needed for moderate pain.     Historical Provider, MD  Insulin Human (INSULIN PUMP) SOLN Inject into the skin continuous. Humalog    Historical Provider, MD  LORazepam (ATIVAN) 1 MG tablet Take 0.5 tablets (0.5 mg total) by mouth 2 (two) times daily as needed for anxiety. Patient not taking: Reported on 04/25/2015 08/10/14   Kathie Dike, MD  metoprolol succinate (TOPROL-XL) 25 MG 24 hr tablet Take 25 mg by mouth 2 (two) times daily.    Historical Provider, MD  pantoprazole (PROTONIX) 40 MG tablet Take 40 mg by mouth 2 (two) times daily.    Historical Provider, MD  pantoprazole (PROTONIX) 40 MG tablet Take 40 mg by mouth 2 (two) times daily.    Historical Provider, MD  predniSONE (DELTASONE) 10 MG tablet Take 40mg  po daily for 2 days then 30mg  po daily for 2 days then 20mg  po daily for 2 days then 10mg  po daily for 2 days then stop 08/10/14   Kathie Dike, MD  simvastatin (ZOCOR) 40 MG tablet Take 40 mg by mouth every evening.    Historical Provider, MD  terazosin (HYTRIN) 1 MG capsule Take 1 mg by mouth 2 (two) times daily.     Historical Provider, MD  Tetrahydrozoline HCl (VISINE OP) Apply to eye as needed.    Historical Provider, MD  tiotropium (SPIRIVA HANDIHALER) 18 MCG inhalation capsule Place 1 capsule (18 mcg total) into inhaler and inhale every morning. 05/11/14   Radene Gunning, NP  traZODone (DESYREL) 150 MG tablet Take 300 mg by mouth at bedtime.     Historical Provider, MD  triamcinolone cream (KENALOG) 0.1 % Apply 1 application topically 2 (two) times daily as needed (for irritation).     Historical Provider, MD  ziprasidone (GEODON) 80 MG capsule Take 80 mg by mouth every evening.    Historical Provider, MD   Triage  Vitals: BP 108/46 mmHg  Pulse 103  Temp(Src) 98 F (36.7 C) (Axillary)  Resp 17  Ht 6' (1.829 m)  Wt 300 lb (136.079 kg)  BMI 40.68 kg/m2  SpO2 93%   Physical Exam  Constitutional: He appears well-developed. No distress.  Difficult exam due to presentation  Pt is obese  HENT:  Head: Normocephalic and atraumatic.  Eyes: Conjunctivae and EOM are normal. Pupils are equal, round, and reactive to light.  Pupils approximately 2-3 mm  Neck: Neck supple. No tracheal deviation  present.  Cardiovascular: Regular rhythm.  Tachycardia present.   Pulmonary/Chest: Effort normal. No respiratory distress. He has no wheezes. He has no rales.  Anterior lung fields clear  Abdominal: Soft. There is no tenderness.  Neurological:  Pt moaning Responds to painful stimuli Grossly flexes arms equally  Skin: Skin is warm and dry.  Dry skin on extremities No obvious cellulitis on exam Chronic skin changes on upper and lower extremities  Psychiatric:  Pt lethargic  Nursing note and vitals reviewed.   ED Course  Procedures (including critical care time) CRITICAL CARE Performed by: Mariea Clonts   Total critical care time: 75 minutes  Critical care time was exclusive of separately billable procedures and treating other patients.  Critical care was necessary to treat or prevent imminent or life-threatening deterioration.  Critical care was time spent personally by me on the following activities: development of treatment plan with patient and/or surrogate as well as nursing, discussions with consultants, evaluation of patient's response to treatment, examination of patient, obtaining history from patient or surrogate, ordering and performing treatments and interventions, ordering and review of laboratory studies, ordering and review of radiographic studies, pulse oximetry and re-evaluation of patient's condition.  EMERGENT INTUBATION PROCEDURE NOTE INDICATION: respiratory failure  TECHNIQUE:  Unable to obtain consent because of altered level of consciousness.  After pre-oxygenating the patient for 5 minutes, a modified rapid-sequence induction was performed using etomidate and rocuronium with cricoid pressure. Using a Macintosh 4 laryngoscope blade and 7.78mm cuffed endotracheal tube was placed and secured.  Placement was confirmed with by CXR. 25 at the lips.  COMPLICATIONS: None. The patient tolerated the procedure well with no complications. POST PROCEDURE CXR: pending   Emergency Ultrasound Study:   Angiocath insertion Performed by: Mariea Clonts  Consent: Verbal consent obtained. Risks and benefits: risks, benefits and alternatives were discussed Immediately prior to procedure the correct patient, procedure, equipment, support staff and site/side marked as needed.  Indication: difficult IV access Preparation: Patient was prepped and draped in the usual sterile fashion. Vein Location: right ac vein was visualized during assessment for potential access sites and was found to be patent/ easily compressed with linear ultrasound.  The needle was visualized with real-time ultrasound and guided into the vein. Gauge: 18  Image saved and stored.  Normal blood return.  Patient tolerance: Patient tolerated the procedure well with no immediate complications.      DIAGNOSTIC STUDIES: Oxygen Saturation is 93% on RA, adequate by my interpretation.    COORDINATION OF CARE: 9:24 AM- treatment plan which includes ABG, CMP, EtOH, CBC with diff, rapid drug screen, salicylate level, acetaminophen level   11:01 AM - Wife states pt has hx of addiction. 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 Pt got prescription filled for Ativan Sunday despite wife telling PCP that it was not a good idea. Pt woke up wife at 5 AM this morning screaming "help, help!" When wife went to check on pt, he was unclothed and diaphoretic. Pt told wife he needs more clothes and wife clothed him. When wife asked if pt took  multiple ativan, pt told her he only had 1 pill. When wife woke up at 7:30 AM this morning she could not wake him up, prompting her to call EMS. Wife does not believe it was a suicidal attempt.   Labs Review Labs Reviewed - No data to display  Imaging Review No results found.    EKG Interpretation None     EKG reviewed sinus tachycardia, incomplete right bundle  branch block, no acute ST elevation, poor baseline V1 and aVF, normal QT MDM   Final diagnoses:  Acute encephalopathy  Drug overdose, intentional self-harm, initial encounter ([place])   I personally performed the services described in this documentation, which was scribed in my presence. The recorded information has been reviewed and is accurate. Patient presents altered mental status after reported Ativan drug overdose assuming intentional at this time. Patient has general lethargy on exam however currently responds to painful stimulus and protecting airway. Patient has sleep apnea history and respiratory failures well. Plan for screening psychiatric labs, ABG, close monitoring and admission. No indication for emergent CT scan at this time unless patient does not improve clinically, unable to obtain CT head at this time a CT scanners down.  On recheck, no improvement, CO2 elevated. At this time I do not feel patient's protecting his airway and with his sleep apnea and comorbidities along with significant drug overdose his clinical status likely will continue to deteriorate. Discussed case with his wife on arrival in detail, decision intubated discussed. Intubation successful. Plan for ICU admission.  The patients results and plan were reviewed and discussed.   Any x-rays performed were independently reviewed by myself.   Differential diagnosis were considered with the presenting HPI.  Medications  0.9 %  sodium chloride infusion (not administered)    Filed Vitals:   08/16/15 0916 08/16/15 0918  BP: 108/46   Pulse: 103    Temp:  98 F (36.7 C)  TempSrc:  Axillary  Resp: 17   Height: 6' (1.829 m)   Weight: 300 lb (136.079 kg)   SpO2: 93%     Final diagnoses:  Acute encephalopathy  Drug overdose, intentional self-harm, initial encounter [facility])    Admission/ observation were discussed with the admitting physician, patient and/or family and they are comfortable with the plan.    Elnora Morrison, MD 08/16/15 [phone]

## 2015-08-16 NOTE — ED Notes (Signed)
Wife brought bottle of Ativan. Dosage of 1 mg tablets, QTY: 60 tablets that was filled yesterday evening. Bottle was empty this morning and wife unable to find any pills that had possibly been dropped.

## 2015-08-16 NOTE — ED Notes (Signed)
Patient intubated 11:19 AM with 7.5 mm ETT by Dr Reather Converse. 25 cm at lip. + color change on co2 sensor. Bilateral breath sounds noted by Dr Reather Converse.

## 2015-08-16 NOTE — ED Notes (Signed)
EKG given to Dr. [name] to review.

## 2015-08-17 DIAGNOSIS — T424X4A Poisoning by benzodiazepines, undetermined, initial encounter: Secondary | ICD-10-CM

## 2015-08-17 DIAGNOSIS — F200 Paranoid schizophrenia: Secondary | ICD-10-CM

## 2015-08-17 DIAGNOSIS — J449 Chronic obstructive pulmonary disease, unspecified: Secondary | ICD-10-CM

## 2015-08-17 DIAGNOSIS — J690 Pneumonitis due to inhalation of food and vomit: Secondary | ICD-10-CM

## 2015-08-17 DIAGNOSIS — J9602 Acute respiratory failure with hypercapnia: Secondary | ICD-10-CM

## 2015-08-17 DIAGNOSIS — G9340 Encephalopathy, unspecified: Secondary | ICD-10-CM

## 2015-08-17 DIAGNOSIS — G4733 Obstructive sleep apnea (adult) (pediatric): Secondary | ICD-10-CM

## 2015-08-17 LAB — GLUCOSE, CAPILLARY
GLUCOSE-CAPILLARY: 239 mg/dL — AB (ref 65–99)
GLUCOSE-CAPILLARY: 243 mg/dL — AB (ref 65–99)
GLUCOSE-CAPILLARY: 272 mg/dL — AB (ref 65–99)
GLUCOSE-CAPILLARY: 286 mg/dL — AB (ref 65–99)
Glucose-Capillary: 251 mg/dL — ABNORMAL HIGH (ref 65–99)
Glucose-Capillary: 259 mg/dL — ABNORMAL HIGH (ref 65–99)

## 2015-08-17 LAB — CBC
HCT: 40.4 % (ref 39.0–52.0)
Hemoglobin: 12 g/dL — ABNORMAL LOW (ref 13.0–17.0)
MCH: 27.3 pg (ref 26.0–34.0)
MCHC: 29.7 g/dL — AB (ref 30.0–36.0)
MCV: 91.8 fL (ref 78.0–100.0)
Platelets: 217 10*3/uL (ref 150–400)
RBC: 4.4 MIL/uL (ref 4.22–5.81)
RDW: 12.8 % (ref 11.5–15.5)
WBC: 14.7 10*3/uL — ABNORMAL HIGH (ref 4.0–10.5)

## 2015-08-17 LAB — BLOOD GAS, ARTERIAL
Acid-base deficit: 10 mmol/L — ABNORMAL HIGH (ref 0.0–2.0)
BICARBONATE: 32.6 meq/L — AB (ref 20.0–24.0)
DRAWN BY: 317771
FIO2: 0.45
LHR: 16 {breaths}/min
MECHVT: 500 mL
O2 Saturation: 94.8 %
PATIENT TEMPERATURE: 38.3
PCO2 ART: 57.1 mmHg — AB (ref 35.0–45.0)
PEEP: 5 cmH2O
PO2 ART: 75.3 mmHg — AB (ref 80.0–100.0)
pH, Arterial: 7.405 (ref 7.350–7.450)

## 2015-08-17 LAB — COMPREHENSIVE METABOLIC PANEL
ALBUMIN: 3.5 g/dL (ref 3.5–5.0)
ALK PHOS: 70 U/L (ref 38–126)
ALT: 10 U/L — AB (ref 17–63)
AST: 12 U/L — AB (ref 15–41)
Anion gap: 9 (ref 5–15)
BILIRUBIN TOTAL: 0.7 mg/dL (ref 0.3–1.2)
BUN: 11 mg/dL (ref 6–20)
CALCIUM: 8.6 mg/dL — AB (ref 8.9–10.3)
CO2: 33 mmol/L — ABNORMAL HIGH (ref 22–32)
CREATININE: 1.03 mg/dL (ref 0.61–1.24)
Chloride: 96 mmol/L — ABNORMAL LOW (ref 101–111)
GFR calc Af Amer: >60 mL/min (ref >60)
GFR calc non Af Amer: >60 mL/min (ref >60)
GLUCOSE: 263 mg/dL — AB (ref 65–99)
Potassium: 4.3 mmol/L (ref 3.5–5.1)
Sodium: 138 mmol/L (ref 135–145)
TOTAL PROTEIN: 6.6 g/dL (ref 6.5–8.1)

## 2015-08-17 LAB — LEGIONELLA PNEUMOPHILA SEROGP 1 UR AG: L. PNEUMOPHILA SEROGP 1 UR AG: NEGATIVE

## 2015-08-17 MED ORDER — SODIUM CHLORIDE 0.9 % IV SOLN
1.0000 mg/h | INTRAVENOUS | Status: DC
  Administered 2015-08-17: 2 mg/h via INTRAVENOUS
  Administered 2015-08-18: 3 mg/h via INTRAVENOUS
  Administered 2015-08-19: 5.5 mg/h via INTRAVENOUS
  Filled 2015-08-17 (×3): qty 10

## 2015-08-17 MED ORDER — FENTANYL CITRATE (PF) 100 MCG/2ML IJ SOLN
50.0000 ug | Freq: Once | INTRAMUSCULAR | Status: AC
  Administered 2015-08-17: 50 ug via INTRAVENOUS

## 2015-08-17 MED ORDER — METHYLPREDNISOLONE SODIUM SUCC 40 MG IJ SOLR
40.0000 mg | Freq: Two times a day (BID) | INTRAMUSCULAR | Status: DC
  Administered 2015-08-17 – 2015-08-20 (×7): 40 mg via INTRAVENOUS
  Filled 2015-08-17 (×7): qty 1

## 2015-08-17 MED ORDER — INSULIN ASPART 100 UNIT/ML ~~LOC~~ SOLN
0.0000 [IU] | Freq: Three times a day (TID) | SUBCUTANEOUS | Status: DC
  Administered 2015-08-17: 7 [IU] via SUBCUTANEOUS

## 2015-08-17 MED ORDER — SODIUM CHLORIDE 0.9 % IV SOLN
3.0000 g | Freq: Four times a day (QID) | INTRAVENOUS | Status: DC
  Administered 2015-08-17: 3 g via INTRAVENOUS
  Filled 2015-08-17 (×5): qty 3

## 2015-08-17 MED ORDER — INSULIN GLARGINE 100 UNIT/ML ~~LOC~~ SOLN
25.0000 [IU] | Freq: Two times a day (BID) | SUBCUTANEOUS | Status: DC
  Administered 2015-08-17 – 2015-08-21 (×9): 25 [IU] via SUBCUTANEOUS
  Filled 2015-08-17 (×11): qty 0.25

## 2015-08-17 MED ORDER — INSULIN ASPART 100 UNIT/ML ~~LOC~~ SOLN
0.0000 [IU] | SUBCUTANEOUS | Status: DC
  Administered 2015-08-17 (×3): 11 [IU] via SUBCUTANEOUS
  Administered 2015-08-18: 4 [IU] via SUBCUTANEOUS
  Administered 2015-08-18: 11 [IU] via SUBCUTANEOUS
  Administered 2015-08-18 (×2): 4 [IU] via SUBCUTANEOUS
  Administered 2015-08-18 (×2): 7 [IU] via SUBCUTANEOUS
  Administered 2015-08-18: 4 [IU] via SUBCUTANEOUS
  Administered 2015-08-19: 3 [IU] via SUBCUTANEOUS
  Administered 2015-08-19: 4 [IU] via SUBCUTANEOUS
  Administered 2015-08-19 (×2): 3 [IU] via SUBCUTANEOUS

## 2015-08-17 MED ORDER — METOPROLOL TARTRATE 50 MG PO TABS
50.0000 mg | ORAL_TABLET | Freq: Two times a day (BID) | ORAL | Status: DC
  Administered 2015-08-17 – 2015-08-21 (×9): 50 mg via ORAL
  Filled 2015-08-17 (×9): qty 1

## 2015-08-17 MED ORDER — ALBUTEROL SULFATE (2.5 MG/3ML) 0.083% IN NEBU
2.5000 mg | INHALATION_SOLUTION | RESPIRATORY_TRACT | Status: DC
  Administered 2015-08-17 – 2015-08-20 (×20): 2.5 mg via RESPIRATORY_TRACT
  Filled 2015-08-17 (×22): qty 3

## 2015-08-17 MED ORDER — SODIUM CHLORIDE 0.9 % IV SOLN
3.0000 g | Freq: Four times a day (QID) | INTRAVENOUS | Status: DC
  Administered 2015-08-17 – 2015-08-20 (×11): 3 g via INTRAVENOUS
  Filled 2015-08-17 (×20): qty 3

## 2015-08-17 MED ORDER — CLOPIDOGREL BISULFATE 75 MG PO TABS
75.0000 mg | ORAL_TABLET | Freq: Every morning | ORAL | Status: DC
  Administered 2015-08-17 – 2015-08-21 (×5): 75 mg via ORAL
  Filled 2015-08-17 (×5): qty 1

## 2015-08-17 MED ORDER — FENTANYL BOLUS VIA INFUSION
25.0000 ug | INTRAVENOUS | Status: DC | PRN
  Administered 2015-08-17 (×3): 25 ug via INTRAVENOUS
  Filled 2015-08-17: qty 25

## 2015-08-17 MED ORDER — SODIUM CHLORIDE 0.9 % IV SOLN
1.0000 mg/h | INTRAVENOUS | Status: DC
  Administered 2015-08-17: 1 mg/h via INTRAVENOUS
  Filled 2015-08-17: qty 10

## 2015-08-17 MED ORDER — FENTANYL CITRATE (PF) 2500 MCG/50ML IJ SOLN
25.0000 ug/h | INTRAMUSCULAR | Status: DC
  Administered 2015-08-17: 200 ug/h via INTRAVENOUS
  Administered 2015-08-17: 50 ug/h via INTRAVENOUS
  Administered 2015-08-18: 250 ug/h via INTRAVENOUS
  Administered 2015-08-18: 20 ug/h via INTRAVENOUS
  Administered 2015-08-19: 350 ug/h via INTRAVENOUS
  Filled 2015-08-17 (×3): qty 50

## 2015-08-17 MED ORDER — SIMVASTATIN 20 MG PO TABS
40.0000 mg | ORAL_TABLET | Freq: Every evening | ORAL | Status: DC
  Administered 2015-08-17 – 2015-08-20 (×4): 40 mg via ORAL
  Filled 2015-08-17 (×4): qty 2

## 2015-08-17 MED ORDER — GUAIFENESIN ER 600 MG PO TB12
1200.0000 mg | ORAL_TABLET | Freq: Two times a day (BID) | ORAL | Status: DC
  Administered 2015-08-17 – 2015-08-21 (×6): 1200 mg via ORAL
  Filled 2015-08-17 (×7): qty 2

## 2015-08-17 MED ORDER — TERAZOSIN HCL 1 MG PO CAPS
1.0000 mg | ORAL_CAPSULE | Freq: Two times a day (BID) | ORAL | Status: DC
  Administered 2015-08-17 – 2015-08-21 (×9): 1 mg via ORAL
  Filled 2015-08-17 (×9): qty 1

## 2015-08-17 MED ORDER — ZIPRASIDONE HCL 80 MG PO CAPS
80.0000 mg | ORAL_CAPSULE | Freq: Every evening | ORAL | Status: DC
  Administered 2015-08-17 – 2015-08-20 (×4): 80 mg via ORAL
  Filled 2015-08-17 (×5): qty 1

## 2015-08-17 NOTE — Progress Notes (Signed)
[facility] PROGRESS NOTE  CORRIE TOMINAGA [id number] DOB: [birth year] DOA: 08/16/2015 PCP: [name], MD  Assessment/Plan: 1. Ativan overdose, intention unknown with resultant resp failure, encephalopathy and aspiration pneumonia. Lactic acid normal, no evidence of sepsis. Dr. Sarajane Jews d/w poison control who recommends supportive care.  2. VDRF, Acute hypercapnic resp failure, resp acidosis secondary to Ativan overdose, complicated by oxygen-dependent COPD, OSA non-compliant with BiPAP. Patient is currently intubated and sedated. ABG 7.405/57.1/75.3. Pulmonology input appreciated. Will attempt daily weaning trials.  3. Suspected RLL/lobar aspiration pneumonia. He is currently afebrile but had a fever of 100.8 overnight. WBC of 14.7. Lactic acid WNL. Steroids and albuterol added per pulmonology. Will change Zosyn to Unasyn. MRSA PCR was negative. 4. Acute encephalopathy, secondary to above.  5. COPD, chronic hypoxic resp failure. Patient is on bronchodilators and steroids. 6. DM on insulin pump, removed. CBG elevated, will start Lantus 25U BID and change SSI to Q4H.  7. Depression, PMH addiction, alcohol, pain meds. Alcohol remote. No recent BZD use until 11/28 8. Chronic pain on hydrocode/APAP. Acetaminophen level normal. 9. Essential HTN, restart Metoprolol.  10. Schizophrenia, restart Geodon.  11. HLD, restart statin.  12. OSA, non-compliant   Code Status: Full  DVT prophylaxis: Lovenox Family Communication: No family at bedside. Patient is intubated and sedated.  Disposition Plan: Continue to monitor in ICU.    Consultants:  Pulmonology  Procedures:  ETT 11/29>>  Antibiotics:  Zosyn 11/29>>11/30  Unasyn 11/30>>  HPI/Subjective: Intubated and sedated. Attempting to remove ETT.   Objective: Filed Vitals:   08/17/15 0300 08/17/15 0400  BP: 128/74 148/75  Pulse: 113 116  Temp:  100.8 F (38.2 C)  Resp: 17 23    Intake/Output Summary (Last 24 hours) at  08/17/15 0759 Last data filed at 08/17/15 0700  Gross per 24 hour  Intake      0 ml  Output   1925 ml  Net  -1925 ml   Filed Weights   08/16/15 0916 08/16/15 1926 08/17/15 0500  Weight: 136.079 kg (300 lb) 104.6 kg (230 lb 9.6 oz) 143.6 kg (316 lb 9.3 oz)    Exam:   General: NAD, intubated and sedated.   Cardiovascular: Tachycardic, regular rhythm   Respiratory: Bilateral rhonchi. No wheezing or rales.  Abdomen: soft, non tender, no distention   Musculoskeletal: No edema b/l  Data Reviewed: Basic Metabolic Panel:  Recent Labs Lab 08/16/15 1006 08/17/15 0445  NA 136 138  K 5.0 4.3  CL 90* 96*  CO2 37* 33*  GLUCOSE 289* 263*  BUN 9 11  CREATININE 0.89 1.03  CALCIUM 8.9 8.6*   Liver Function Tests:  Recent Labs Lab 08/16/15 1006 08/17/15 0445  AST 10* 12*  ALT 11* 10*  ALKPHOS 70 70  BILITOT 0.7 0.7  PROT 7.2 6.6  ALBUMIN 3.9 3.5   CBC:  Recent Labs Lab 08/16/15 1006 08/17/15 0445  WBC 11.8* 14.7*  NEUTROABS 9.7*  --   HGB 12.8* 12.0*  HCT 42.5 40.4  MCV 91.0 91.8  PLT 188 217   CBG:  Recent Labs Lab 08/16/15 1635 08/16/15 2003 08/16/15 2350 08/17/15 0352  GLUCAP 300* 283* 272* 243*    Recent Results (from the past 240 hour(s))  MRSA PCR Screening     Status: None   Collection Time: 08/16/15  4:00 PM  Result Value Ref Range Status   MRSA by PCR NEGATIVE NEGATIVE Final    Comment:        The GeneXpert MRSA Assay (FDA  approved for NASAL specimens only), is one component of a comprehensive MRSA colonization surveillance program. It is not intended to diagnose MRSA infection nor to guide or monitor treatment for MRSA infections.      Studies: Ct Head Wo Contrast  08/16/2015  CLINICAL DATA:  Altered mental status.  Possible overdose. EXAM: CT HEAD WITHOUT CONTRAST TECHNIQUE: Contiguous axial images were obtained from the base of the skull through the vertex without intravenous contrast. COMPARISON:  CT 12/05/2014 FINDINGS:  Mild atrophy. Negative for hydrocephalus. Mild chronic microvascular ischemic change in the white matter. Negative for acute infarct.  Negative for hemorrhage or mass. Negative calvarium. IMPRESSION: Atrophy and chronic microvascular ischemia.  No acute abnormality. Electronically Signed   By: Franchot Gallo M.D.   On: 08/16/2015 15:13   Dg Chest Portable 1 View  08/16/2015  CLINICAL DATA:  Drug overdose.  Intubation EXAM: PORTABLE CHEST 1 VIEW COMPARISON:  12/05/2014 FINDINGS: Endotracheal tube in good position. NG tube enters the stomach with the tip not visualized. Right lower lobe airspace disease may represent atelectasis or pneumonia. Possible small right effusion Negative for heart failure.  Left lung clear. IMPRESSION: Endotracheal tube in good position. Right lower lobe airspace consolidation may represent atelectasis or pneumonia. Follow-up recommended. Electronically Signed   By: Franchot Gallo M.D.   On: 08/16/2015 11:48    Scheduled Meds: . antiseptic oral rinse  7 mL Mouth Rinse QID  . aspirin  81 mg Per Tube q morning - 10a  . chlorhexidine gluconate  15 mL Mouth Rinse BID  . enoxaparin (LOVENOX) injection  40 mg Subcutaneous Q24H  . fentaNYL (SUBLIMAZE) injection  50 mcg Intravenous Once  . insulin aspart  0-20 Units Subcutaneous TID WC  . pantoprazole (PROTONIX) IV  40 mg Intravenous Daily  . piperacillin-tazobactam (ZOSYN)  IV  3.375 g Intravenous Q8H  . sodium chloride  3 mL Intravenous Q12H  . tiotropium  18 mcg Inhalation q morning - 10a   Continuous Infusions: . sodium chloride 125 mL/hr at 08/16/15 1545  . fentaNYL infusion INTRAVENOUS      Principal Problem:   Acute respiratory failure with hypercapnia (HCC) Active Problems:   Obstructive sleep apnea   Paranoid schizophrenia ([place])   DM (diabetes mellitus) ([place])   Chronic respiratory failure 02 dep with hypercarbia   Morbid obesity ([place])   Acute encephalopathy   COPD (chronic obstructive pulmonary disease)  (HCC)   Obesity hypoventilation syndrome (HCC)   Aspiration pneumonia (HCC)   Lorazepam overdose    Time spent: critical care: 30 minutes   Blimy Napoleon. MD  [facility] Pager [phone]. If 7PM-7AM, please contact night-coverage at [email or web address], password [facility] 08/17/2015, 7:59 AM  LOS: 1 day    By signing my name below, I, Rosalie Doctor, attest that this documentation has been prepared under the direction and in the presence of [facility]. MD Electronically Signed: Rosalie Doctor, Scribe. 08/17/2015 8:58am  I, Dr. Kathie Dike, personally performed the services described in this documentaiton. All medical record entries made by the scribe were at my direction and in my presence. I have reviewed the chart and agree that the record reflects my personal performance and is accurate and complete  Kathie Dike, MD, 08/17/2015 9:25 AM

## 2015-08-17 NOTE — Progress Notes (Signed)
Patient continues to be restless. -provider alerted -protocol to be engaged

## 2015-08-17 NOTE — Progress Notes (Signed)
Inpatient Diabetes Program Recommendations  AACE/ADA: New Consensus Statement on Inpatient Glycemic Control (2015)  Target Ranges:  Prepandial:   less than 140 mg/dL      Peak postprandial:   less than 180 mg/dL (1-2 hours)      Critically ill patients:  140 - 180 mg/dL   Results for SANSKAR, MANDELL (MRN [id number]) as of 08/17/2015 08:54  Ref. Range 08/16/2015 16:35 08/16/2015 20:03 08/16/2015 23:50 08/17/2015 03:52  Glucose-Capillary Latest Ref Range: 65-99 mg/dL 300 (H) 283 (H) 272 (H) 243 (H)   Review of Glycemic Control  Diabetes history: DM 2 Outpatient Diabetes medications: Humalog Insulin pump Current orders for Inpatient glycemic control: Novolog Resistant TID  Inpatient Diabetes Program Recommendations:  Insulin - Basal: Patient is on insulin pump at home taking an equivalent of 96 units of basal insulin in a 24 hour period. Glucose is still in the 200's. Please consider ordering Lantus 25 units BID. Correction (SSI): Patient increased to Resistant Correction scale. While on the vent, please consider changing the frequency of correction to Q4hrs.  Thanks,  Tama Headings RN, MSN, [facility] Inpatient Diabetes Coordinator Team Pager [phone] (8a-5p)

## 2015-08-17 NOTE — Progress Notes (Signed)
ANTIBIOTIC CONSULT NOTE - INITIAL  Pharmacy Consult for Unasyn Indication: pneumonia  Allergies  Allergen Reactions  . Phenergan [Promethazine Hcl] Other (See Comments)    Becomes very confused and aggressive  . Haldol [Haloperidol Lactate] Other (See Comments)    Shaking   . Metformin And Related Diarrhea    Patient Measurements: Height: 5\' 9"  (175.3 cm) Weight: (!) 316 lb 9.3 oz (143.6 kg) IBW/kg (Calculated) : 70.7kg  Vital Signs: Temp: 98.1 F (36.7 C) (11/30 0829) Temp Source: Oral (11/30 0829) BP: 144/67 mmHg (11/30 0830) Pulse Rate: 108 (11/30 0830) Intake/Output from previous day: 11/29 0701 - 11/30 0700 In: -  Out: 1925 [id number]; Emesis/NG output:250] Intake/Output from this shift:    Labs:  Recent Labs  08/16/15 1006 08/17/15 0445  WBC 11.8* 14.7*  HGB 12.8* 12.0*  PLT 188 217  CREATININE 0.89 1.03   Estimated Creatinine Clearance: 97 mL/min (by C-G formula based on Cr of 1.03). No results for input(s): VANCOTROUGH, VANCOPEAK, VANCORANDOM, GENTTROUGH, GENTPEAK, GENTRANDOM, TOBRATROUGH, TOBRAPEAK, TOBRARND, AMIKACINPEAK, AMIKACINTROU, AMIKACIN in the last 72 hours.   Microbiology: Recent Results (from the past 720 hour(s))  MRSA PCR Screening     Status: None   Collection Time: 08/16/15  4:00 PM  Result Value Ref Range Status   MRSA by PCR NEGATIVE NEGATIVE Final    Comment:        The GeneXpert MRSA Assay (FDA approved for NASAL specimens only), is one component of a comprehensive MRSA colonization surveillance program. It is not intended to diagnose MRSA infection nor to guide or monitor treatment for MRSA infections.   Culture, respiratory (NON-Expectorated)     Status: None (Preliminary result)   Collection Time: 08/16/15  4:25 PM  Result Value Ref Range Status   Specimen Description TRACHEAL ASPIRATE  Final   Special Requests NONE  Final   Gram Stain   Final    MODERATE WBC PRESENT,BOTH PMN AND MONONUCLEAR RARE SQUAMOUS EPITHELIAL  CELLS PRESENT FEW GRAM POSITIVE COCCI IN PAIRS IN CLUSTERS Performed at [facility]    Culture PENDING  Incomplete   Report Status PENDING  Incomplete    Medical History: Past Medical History  Diagnosis Date  . COPD (chronic obstructive pulmonary disease) ([place])   . CHF (congestive heart failure) (HCC)     diastolic   . Hypercholesterolemia   . Depression   . Schizophrenia ([place])   . Sleep apnea     noncompliant with BiPAP  . Tobacco abuse   . On home O2     3 liters  . Chronic pain   . Diabetes mellitus without complication (HCC)     insulin pump  . 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 The wife found him lethargic and diaphoretic and an empty Ativan bottle. Presumed to be an overdose on benzodiazepines.  He has been hypoxic and a chest x-ray showed RLL pneumonia presumably from aspiration. Unasyn for anaerobe coverage. MRSA PCR is negative. Resp cx few GPC  Goal of Therapy:  resolution of infection  Plan:  Unasyn 3gm IV q6hr Follow up culture results monitor v/s and labs   Isac Sarna, BS Pharm D, BCPS Clinical Pharmacist Pager [phone] 08/17/2015,9:10 AM

## 2015-08-17 NOTE — Progress Notes (Addendum)
Initial Nutrition Assessment  DOCUMENTATION CODES:  Morbid obesity  INTERVENTION:  If unable to extubate within 24 hours and medically able, recommend Tube Feeding.  VHP @ 20 ml/hr via OGT/NGTand increase by 10 ml every 4 hours to goal rate of 70 ml/hr.   Tube feeding regimen provides 1680 kcal (100% of needs), 147 grams of protein, and 1404 ml of H2O.   NUTRITION DIAGNOSIS:  Inadequate oral intake related to inability to eat as evidenced by NPO status.  GOAL:  Provide needs based on ASPEN/SCCM guidelines  MONITOR:  Vent status, TF tolerance, Labs, Diet advancement, I & O's  REASON FOR ASSESSMENT:  Ventilator    ASSESSMENT:  69 y/o Male PMHx  Uncontrolled DM2, CHF, COPD Schizophrenia, depression who presented to the ED for lethargy and suspected Ativan overdose. Intubated in ED for inability to protect airway. Looks to have aspiration PNA aswell.  Spoke with family members. They report that prior to this incident, pt was eating very well. They report that he was very noncompliant with a DM diet and ate/drank whatever he wanted. They report that he checked his bg and was doing good when it read 200 mg/dl. Poor glycemic control has recently resulted in him losing his teeth. Family states he has had multiple education sessions on insulin/diet/bg control, but still is noncompliant. Let family know that if he changes his mind and would like to here information again, RD would be happy to offer some.  He did not take any vitamin or mineral supplement. He did not have any chronic n/v/c. They report pt does have long term issues with diarrhea, but unsure of cause.   Pt was documented in ED as weighing 300 lbs. Family members think that this is a low weight as he was weighed at a recent doctors appointment as 316 lbs. Will use this as dosing weight  Patient is currently intubated on ventilator support MV: 8.0 L/min Temp (24hrs), Avg:99.2 F (37.3 C), Min:98.1 F (36.7 C), Max:100.9 F  (38.3 C)  NFPE: WDL  Labs reviewed: Significantly hyperglycemic. 239-286 mg/dl today.  Diet Order:  Diet NPO time specified  Skin:  Dry/Flaky  Last BM:  Unknown  Height:  Ht Readings from Last 1 Encounters:  08/17/15 5\' 9"  (1.753 m)   Weight:  Wt Readings from Last 1 Encounters:  08/17/15 316 lb 9.3 oz (143.6 kg)   Wt Readings from Last 10 Encounters:  08/17/15 316 lb 9.3 oz (143.6 kg)  04/05/15 307 lb (139.254 kg)  02/16/15 320 lb (145.151 kg)  02/03/15 324 lb (146.965 kg)  12/05/14 313 lb 8 oz (142.203 kg)  08/17/14 293 lb (132.904 kg)  08/09/14 289 lb 11 oz (131.4 kg)  07/21/14 290 lb (131.543 kg)  07/21/14 290 lb (131.543 kg)  07/16/14 296 lb 8.3 oz (134.5 kg)  300 lbs admit-weight  Ideal Body Weight:  72.73 kg  BMI:  Body mass index is 46.73 kg/(m^2).  Estimated Nutritional Needs:  Kcal:  1580-2010 (11-14 kcal/kg bw) Protein:  >145 g (>2g/kg bw) Fluid:  1.6-2 liters fluid  EDUCATION NEEDS:  No education needs identified at this time  Burtis Junes RD, LDN Nutrition Pager: [phone] 08/17/2015 4:47 PM

## 2015-08-17 NOTE — Consult Note (Signed)
Consult requested by: [facility] Consult requested for respiratory failure:  HPI: This is a 69 year old who presented to the emergency room with lethargy. He saw his primary care physician on the day prior to admission and was given a prescription for Ativan. His wife says it early in the morning of admission he called for help and she found him to be lethargic and diaphoretic. He said he had taken one of his Ativan but she found the Ativan bottle to be empty and is presumed that he overdosed on the entire bottle. She says that she does not feel like this was a suicide attempt but he has a previous history of accidental overdose. He has sleep apnea and is noncompliant with his C Pap/BiPAP. He has had episodes of encephalopathy with hypoxia in the past and he was not wearing his oxygen on the night prior to his acute event. He was intubated in the emergency department for airway protection. He remains on the ventilator. Chest x-ray showed right lower lobe pneumonia presumably from aspiration. He is a little bit agitated this morning.  Past Medical History  Diagnosis Date  . COPD (chronic obstructive pulmonary disease) ([place])   . CHF (congestive heart failure) ([facility])     diastolic   . Hypercholesterolemia   . Depression   . Schizophrenia ([place])   . Sleep apnea     noncompliant with BiPAP  . Tobacco abuse   . On home O2     3 liters  . Chronic pain   . Diabetes mellitus without complication ([facility])     insulin pump  . 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 Colon cancer Neg Hx   . Parkinson's disease    . Thyroid disease Mother   . Parkinson's disease Father      Social History   Social History  . Marital Status: Married    Spouse Name: N/A  . Number of Children: N/A  . Years of Education: N/A   Social History Main Topics  . Smoking status: Former Smoker -- 1.00 packs/day for 50 years    Types: Cigarettes    Quit date:  09/16/2013  . Smokeless tobacco: Never Used  . Alcohol Use: No     Comment: history of ETOH abuse in remote past, none in at least 10 years  . Drug Use: No  . Sexual Activity: Not Asked   Other Topics Concern  . None   Social History Narrative   Drinks about 4 cups of caffeine daily. 2 cups of coffee and 2 coke zeros.     ROS: Unobtainable    Objective: Vital signs in last 24 hours: Temp:  [97.3 F (36.3 C)-100.9 F (38.3 C)] 100.8 F (38.2 C) (11/30 0400) Pulse Rate:  [100-129] 108 (11/30 0745) Resp:  [13-24] 17 (11/30 0745) BP: (56-159)/(46-95) 153/93 mmHg (11/30 0745) SpO2:  [85 %-100 %] 94 % (11/30 0745) FiO2 (%):  [45 %-100 %] 45 % (11/30 0229) Weight:  [104.6 kg (230 lb 9.6 oz)-143.6 kg (316 lb 9.3 oz)] 143.6 kg (316 lb 9.3 oz) (11/30 0500) Weight change:     Intake/Output from previous day: 11/29 0701 - 11/30 0700 In: -  Out: 1925 [Urine:1675; Emesis/NG output:250]  PHYSICAL EXAM He is an obese male who is intubated and on mechanical ventilator. He is arousable. He is actually somewhat agitated so I think we need to increase his sedation.  His pupils react and throat are clear mucous membranes are moist his neck is supple without masses. Chest shows rhonchi on the right diminished breath sounds on the left. His heart is regular without gallop. His abdomen is soft with no masses. Extremities show no edema and central nervous system exam really not able to be assessed except he is agitated  Lab Results: Basic Metabolic Panel:  Recent Labs  08/16/15 1006 08/17/15 0445  NA 136 138  K 5.0 4.3  CL 90* 96*  CO2 37* 33*  GLUCOSE 289* 263*  BUN 9 11  CREATININE 0.89 1.03  CALCIUM 8.9 8.6*   Liver Function Tests:  Recent Labs  08/16/15 1006 08/17/15 0445  AST 10* 12*  ALT 11* 10*  ALKPHOS 70 70  BILITOT 0.7 0.7  PROT 7.2 6.6  ALBUMIN 3.9 3.5   No results for input(s): LIPASE, AMYLASE in the last 72 hours. No results for input(s): AMMONIA in the last  72 hours. CBC:  Recent Labs  08/16/15 1006 08/17/15 0445  WBC 11.8* 14.7*  NEUTROABS 9.7*  --   HGB 12.8* 12.0*  HCT 42.5 40.4  MCV 91.0 91.8  PLT 188 217   Cardiac Enzymes: No results for input(s): CKTOTAL, CKMB, CKMBINDEX, TROPONINI in the last 72 hours. BNP: No results for input(s): PROBNP in the last 72 hours. D-Dimer: No results for input(s): DDIMER in the last 72 hours. CBG:  Recent Labs  08/16/15 1635 08/16/15 2003 08/16/15 2350 08/17/15 0352  GLUCAP 300* 283* 272* 243*   Hemoglobin A1C: No results for input(s): HGBA1C in the last 72 hours. Fasting Lipid Panel: No results for input(s): CHOL, HDL, LDLCALC, TRIG, CHOLHDL, LDLDIRECT in the last 72 hours. Thyroid Function Tests: No results for input(s): TSH, T4TOTAL, FREET4, T3FREE, THYROIDAB in the last 72 hours. Anemia Panel: No results for input(s): VITAMINB12, FOLATE, FERRITIN, TIBC, IRON, RETICCTPCT in the last 72 hours. Coagulation: No results for input(s): LABPROT, INR in the last 72 hours. Urine Drug Screen: Drugs of Abuse     Component Value Date/Time   LABOPIA POSITIVE* 08/16/2015 0954   COCAINSCRNUR NONE DETECTED 08/16/2015 0954   LABBENZ POSITIVE* 08/16/2015 0954   AMPHETMU NONE DETECTED 08/16/2015 0954   THCU NONE DETECTED 08/16/2015 0954   LABBARB NONE DETECTED 08/16/2015 0954    Alcohol Level:  Recent Labs  08/16/15 1006  ETH <5   Urinalysis:  Recent Labs  08/16/15 0956  COLORURINE YELLOW  LABSPEC <1.005*  PHURINE 7.0  GLUCOSEU 250*  HGBUR NEGATIVE  BILIRUBINUR NEGATIVE  KETONESUR NEGATIVE  PROTEINUR NEGATIVE  NITRITE NEGATIVE  LEUKOCYTESUR NEGATIVE   Misc. Labs:   ABGS:  Recent Labs  08/17/15 0315  PHART 7.405  PO2ART 75.3*  HCO3 32.6*     MICROBIOLOGY: Recent Results (from the past 240 hour(s))  MRSA PCR Screening     Status: None   Collection Time: 08/16/15  4:00 PM  Result Value Ref Range Status   MRSA by PCR NEGATIVE NEGATIVE Final    Comment:         The GeneXpert MRSA Assay (FDA approved for NASAL specimens only), is one component of a comprehensive MRSA colonization surveillance program. It is not intended to diagnose MRSA infection nor to guide or monitor treatment for MRSA infections.     Studies/Results: Ct Head Wo Contrast  08/16/2015  CLINICAL DATA:  Altered mental status.  Possible overdose. EXAM: CT HEAD WITHOUT CONTRAST TECHNIQUE: Contiguous axial images were obtained from the base of the skull through the  vertex without intravenous contrast. COMPARISON:  CT 12/05/2014 FINDINGS: Mild atrophy. Negative for hydrocephalus. Mild chronic microvascular ischemic change in the white matter. Negative for acute infarct.  Negative for hemorrhage or mass. Negative calvarium. IMPRESSION: Atrophy and chronic microvascular ischemia.  No acute abnormality. Electronically Signed   By: Franchot Gallo M.D.   On: 08/16/2015 15:13   Dg Chest Portable 1 View  08/16/2015  CLINICAL DATA:  Drug overdose.  Intubation EXAM: PORTABLE CHEST 1 VIEW COMPARISON:  12/05/2014 FINDINGS: Endotracheal tube in good position. NG tube enters the stomach with the tip not visualized. Right lower lobe airspace disease may represent atelectasis or pneumonia. Possible small right effusion Negative for heart failure.  Left lung clear. IMPRESSION: Endotracheal tube in good position. Right lower lobe airspace consolidation may represent atelectasis or pneumonia. Follow-up recommended. Electronically Signed   By: Franchot Gallo M.D.   On: 08/16/2015 11:48    Medications:  Prior to Admission:  Prescriptions prior to admission  Medication Sig Dispense Refill Last Dose  . aspirin 81 MG chewable tablet Chew 81 mg by mouth every morning.    08/15/2015 at Unknown time  . cholecalciferol (VITAMIN D) 1000 UNITS tablet Take 2,000 Units by mouth daily.   08/15/2015 at Unknown time  . Cinnamon 500 MG capsule Take 1,000 mg by mouth every morning.    08/15/2015 at Unknown time  .  citalopram (CELEXA) 20 MG tablet Take 20 mg by mouth every morning.   08/15/2015 at Unknown time  . clopidogrel (PLAVIX) 75 MG tablet Take 75 mg by mouth every morning.   08/15/2015 at Unknown time  . furosemide (LASIX) 40 MG tablet Take 1 tablet (40 mg total) by mouth daily. 30 tablet 1 08/15/2015 at Unknown time  . gabapentin (NEURONTIN) 300 MG capsule Take 300 mg by mouth 4 (four) times daily.    08/15/2015 at Unknown time  . Insulin Human (INSULIN PUMP) SOLN Inject into the skin continuous. Humalog   08/16/2015 at Unknown time  . LORazepam (ATIVAN) 1 MG tablet Take 0.5 tablets (0.5 mg total) by mouth 2 (two) times daily as needed for anxiety. 20 tablet 0 08/15/2015 at Unknown time  . metoprolol (LOPRESSOR) 50 MG tablet Take 50 mg by mouth 2 (two) times daily.   08/15/2015 at Unknown time  . pantoprazole (PROTONIX) 40 MG tablet Take 40 mg by mouth 2 (two) times daily.   08/15/2015 at Unknown time  . simvastatin (ZOCOR) 40 MG tablet Take 40 mg by mouth every evening.   08/15/2015 at Unknown time  . sodium chloride 1 G tablet Take 1 g by mouth 2 (two) times daily.   08/15/2015 at Unknown time  . terazosin (HYTRIN) 1 MG capsule Take 1 mg by mouth 2 (two) times daily.    08/15/2015 at Unknown time  . tiotropium (SPIRIVA HANDIHALER) 18 MCG inhalation capsule Place 1 capsule (18 mcg total) into inhaler and inhale every morning. 30 capsule 12 08/15/2015 at Unknown time  . traZODone (DESYREL) 150 MG tablet Take 300 mg by mouth at bedtime.    08/15/2015 at Unknown time  . ziprasidone (GEODON) 80 MG capsule Take 80 mg by mouth every evening.   08/15/2015 at Unknown time  . albuterol (PROVENTIL HFA;VENTOLIN HFA) 108 (90 BASE) MCG/ACT inhaler Inhale 2 puffs into the lungs every 6 (six) hours as needed for wheezing or shortness of breath. 1 Inhaler 2 unknown  . albuterol (PROVENTIL) (2.5 MG/3ML) 0.083% nebulizer solution Take 3 mLs (2.5 mg total) by nebulization every  6 (six) hours as needed for wheezing or  shortness of breath. 75 mL 0 unknown  . HYDROcodone-acetaminophen (NORCO/VICODIN) 5-325 MG per tablet Take 1 tablet by mouth every 4 (four) hours as needed for moderate pain.    unknown  . Tetrahydrozoline HCl (VISINE OP) Apply 1 drop to eye daily as needed (irritation).    unknown  . triamcinolone cream (KENALOG) 0.1 % Apply 1 application topically 2 (two) times daily as needed (for irritation).    unknown   Scheduled: . antiseptic oral rinse  7 mL Mouth Rinse QID  . aspirin  81 mg Per Tube q morning - 10a  . chlorhexidine gluconate  15 mL Mouth Rinse BID  . enoxaparin (LOVENOX) injection  40 mg Subcutaneous Q24H  . insulin aspart  0-20 Units Subcutaneous TID WC  . pantoprazole (PROTONIX) IV  40 mg Intravenous Daily  . piperacillin-tazobactam (ZOSYN)  IV  3.375 g Intravenous Q8H  . sodium chloride  3 mL Intravenous Q12H  . tiotropium  18 mcg Inhalation q morning - 10a   Continuous: . sodium chloride 125 mL/hr at 08/16/15 1545  . fentaNYL infusion INTRAVENOUS     [id number] **OR** acetaminophen, albuterol, fentaNYL, fentaNYL (SUBLIMAZE) injection, midazolam  Assesment: He has acute hypercapnic respiratory failure probably from lorazepam overdose. 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 He has significant COPD. He also has sleep apnea/obesity hypoventilation syndrome. He has what appears to be aspiration pneumonia. He is acutely encephalopathic. His situation is complicated by his history of paranoid schizophrenia Principal Problem:   Acute respiratory failure with hypercapnia ([place]) Active Problems:   Obstructive sleep apnea   Paranoid schizophrenia ([place])   DM (diabetes mellitus) ([place])   Chronic respiratory failure 02 dep with hypercarbia   Morbid obesity ([place])   Acute encephalopathy   COPD (chronic obstructive pulmonary disease) ([facility])   Obesity hypoventilation syndrome ([facility])   Aspiration pneumonia ([facility])   Lorazepam overdose    Plan: I  increased his sedation. We can try weaning but I doubt he'll be ready today. Agree with current antibiotics. His blood sugar has been up and I took the liberty of increasing his sliding scale to resistant. I added moderate dose steroids for the pneumonia. Add albuterol treatments routinely. Depending on how he responds to continuous infusion he may need to be switched to another form of sedation    LOS: 1 day   Sherril Shipman L 08/17/2015, 8:15 AM

## 2015-08-17 NOTE — Progress Notes (Signed)
Patient becoming more aroused and pulling at tubes. -patient placed in safety mitts

## 2015-08-18 ENCOUNTER — Inpatient Hospital Stay (HOSPITAL_COMMUNITY): Payer: Medicare Other

## 2015-08-18 DIAGNOSIS — E662 Morbid (severe) obesity with alveolar hypoventilation: Secondary | ICD-10-CM

## 2015-08-18 DIAGNOSIS — Z794 Long term (current) use of insulin: Secondary | ICD-10-CM

## 2015-08-18 DIAGNOSIS — E118 Type 2 diabetes mellitus with unspecified complications: Secondary | ICD-10-CM

## 2015-08-18 LAB — CBC
HCT: 38.5 % — ABNORMAL LOW (ref 39.0–52.0)
HEMOGLOBIN: 11.4 g/dL — AB (ref 13.0–17.0)
MCH: 27.3 pg (ref 26.0–34.0)
MCHC: 29.6 g/dL — ABNORMAL LOW (ref 30.0–36.0)
MCV: 92.3 fL (ref 78.0–100.0)
PLATELETS: 186 10*3/uL (ref 150–400)
RBC: 4.17 MIL/uL — AB (ref 4.22–5.81)
RDW: 13.1 % (ref 11.5–15.5)
WBC: 10.8 10*3/uL — ABNORMAL HIGH (ref 4.0–10.5)

## 2015-08-18 LAB — BLOOD GAS, ARTERIAL
ACID-BASE EXCESS: 8.8 mmol/L — AB (ref 0.0–2.0)
BICARBONATE: 31.5 meq/L — AB (ref 20.0–24.0)
DRAWN BY: 22223
FIO2: 40
O2 SAT: 91.1 %
PEEP/CPAP: 5 cmH2O
PH ART: 7.384 (ref 7.350–7.450)
Patient temperature: 37
RATE: 16 resp/min
TCO2: 13.5 mmol/L (ref 0–100)
VT: 500 mL
pCO2 arterial: 58.3 mmHg (ref 35.0–45.0)
pO2, Arterial: 63.8 mmHg — ABNORMAL LOW (ref 80.0–100.0)

## 2015-08-18 LAB — BASIC METABOLIC PANEL
ANION GAP: 6 (ref 5–15)
BUN: 12 mg/dL (ref 6–20)
CALCIUM: 8.6 mg/dL — AB (ref 8.9–10.3)
CO2: 34 mmol/L — ABNORMAL HIGH (ref 22–32)
CREATININE: 0.89 mg/dL (ref 0.61–1.24)
Chloride: 102 mmol/L (ref 101–111)
Glucose, Bld: 237 mg/dL — ABNORMAL HIGH (ref 65–99)
Potassium: 4.8 mmol/L (ref 3.5–5.1)
SODIUM: 142 mmol/L (ref 135–145)

## 2015-08-18 LAB — GLUCOSE, CAPILLARY
GLUCOSE-CAPILLARY: 159 mg/dL — AB (ref 65–99)
GLUCOSE-CAPILLARY: 231 mg/dL — AB (ref 65–99)
GLUCOSE-CAPILLARY: 258 mg/dL — AB (ref 65–99)
Glucose-Capillary: 157 mg/dL — ABNORMAL HIGH (ref 65–99)
Glucose-Capillary: 183 mg/dL — ABNORMAL HIGH (ref 65–99)
Glucose-Capillary: 187 mg/dL — ABNORMAL HIGH (ref 65–99)
Glucose-Capillary: 210 mg/dL — ABNORMAL HIGH (ref 65–99)

## 2015-08-18 MED ORDER — MIDAZOLAM HCL 50 MG/10ML IJ SOLN
INTRAMUSCULAR | Status: AC
  Filled 2015-08-18: qty 1

## 2015-08-18 MED ORDER — FENTANYL CITRATE (PF) 2500 MCG/50ML IJ SOLN
INTRAMUSCULAR | Status: AC
  Filled 2015-08-18: qty 50

## 2015-08-18 NOTE — Progress Notes (Signed)
Subjective: He remains intubated and on the ventilator. He is improved however he is on 50% oxygen now. He has moderate secretions.  Objective: Vital signs in last 24 hours: Temp:  [97.5 F (36.4 C)-98.5 F (36.9 C)] 98.3 F (36.8 C) (12/01 0757) Pulse Rate:  [63-121] 70 (12/01 0800) Resp:  [14-29] 23 (12/01 0800) BP: (84-164)/(47-142) 121/60 mmHg (12/01 0800) SpO2:  [90 %-95 %] 93 % (12/01 0800) FiO2 (%):  [40 %-50 %] 50 % (12/01 0606) Weight:  [139.5 kg (307 lb 8.7 oz)] 139.5 kg (307 lb 8.7 oz) (12/01 0500) Weight change: 3.421 kg (7 lb 8.7 oz)    Intake/Output from previous day: 11/30 0701 - 12/01 0700 In: 2168.5 [I.V.:1808.5; NG/GT:60; IV Piggyback:300] Out: 1150 [Urine:1150]  PHYSICAL EXAM General appearance: alert, cooperative and mild distress Resp: rhonchi bilaterally Cardio: regular rate and rhythm, S1, S2 normal, no murmur, click, rub or gallop GI: soft, non-tender; bowel sounds normal; no masses,  no organomegaly Extremities: extremities normal, atraumatic, no cyanosis or edema  Lab Results:  Results for orders placed or performed during the hospital encounter of 08/16/15 (from the past 48 hour(s))  Urine rapid drug screen (hosp performed)not at [facility]     Status: Abnormal   Collection Time: 08/16/15  9:54 AM  Result Value Ref Range   Opiates POSITIVE (A) NONE DETECTED   Cocaine NONE DETECTED NONE DETECTED   Benzodiazepines POSITIVE (A) NONE DETECTED   Amphetamines NONE DETECTED NONE DETECTED   Tetrahydrocannabinol NONE DETECTED NONE DETECTED   Barbiturates NONE DETECTED NONE DETECTED    Comment:        DRUG SCREEN FOR MEDICAL PURPOSES ONLY.  IF CONFIRMATION IS NEEDED FOR ANY PURPOSE, NOTIFY LAB WITHIN 5 DAYS.        LOWEST DETECTABLE LIMITS FOR URINE DRUG SCREEN Drug Class       Cutoff (ng/mL) Amphetamine      1000 Barbiturate      200 Benzodiazepine   809 Tricyclics       983 Opiates          300 Cocaine          300 THC              50    Urinalysis, Routine w reflex microscopic (not at [facility])     Status: Abnormal   Collection Time: 08/16/15  9:56 AM  Result Value Ref Range   Color, Urine YELLOW YELLOW   APPearance CLEAR CLEAR   Specific Gravity, Urine <1.005 (Cruz) 1.005 - 1.030   pH 7.0 5.0 - 8.0   Glucose, UA 250 (A) NEGATIVE mg/dL   Hgb urine dipstick NEGATIVE NEGATIVE   Bilirubin Urine NEGATIVE NEGATIVE   Ketones, ur NEGATIVE NEGATIVE mg/dL   Protein, ur NEGATIVE NEGATIVE mg/dL   Nitrite NEGATIVE NEGATIVE   Leukocytes, UA NEGATIVE NEGATIVE    Comment: MICROSCOPIC NOT DONE ON URINES WITH NEGATIVE PROTEIN, BLOOD, LEUKOCYTES, NITRITE, OR GLUCOSE <1000 mg/dL.  Comprehensive metabolic panel     Status: Abnormal   Collection Time: 08/16/15 10:06 AM  Result Value Ref Range   Sodium 136 135 - 145 mmol/Cruz   Potassium 5.0 3.5 - 5.1 mmol/Cruz   Chloride 90 (Cruz) 101 - 111 mmol/Cruz   CO2 37 (H) 22 - 32 mmol/Cruz   Glucose, Bld 289 (H) 65 - 99 mg/dL   BUN 9 6 - 20 mg/dL   Creatinine, Ser 0.89 0.61 - 1.24 mg/dL   Calcium 8.9 8.9 - 10.3 mg/dL   Total Protein  7.2 6.5 - 8.1 g/dL   Albumin 3.9 3.5 - 5.0 g/dL   AST 10 (Cruz) 15 - 41 U/Cruz   ALT 11 (Cruz) 17 - 63 U/Cruz   Alkaline Phosphatase 70 38 - 126 U/Cruz   Total Bilirubin 0.7 0.3 - 1.2 mg/dL   GFR calc non Af Amer >60 >60 mL/min   GFR calc Af Amer >60 >60 mL/min    Comment: (NOTE) The eGFR has been calculated using the CKD EPI equation. This calculation has not been validated in all clinical situations. eGFR's persistently <60 mL/min signify possible Chronic Kidney Disease.    Anion gap 9 5 - 15  Ethanol     Status: None   Collection Time: 08/16/15 10:06 AM  Result Value Ref Range   Alcohol, Ethyl (B) <5 <5 mg/dL    Comment:        LOWEST DETECTABLE LIMIT FOR SERUM ALCOHOL IS 5 mg/dL FOR MEDICAL PURPOSES ONLY   CBC with Diff     Status: Abnormal   Collection Time: 08/16/15 10:06 AM  Result Value Ref Range   WBC 11.8 (H) 4.0 - 10.5 K/uL   RBC 4.67 4.22 - 5.81 MIL/uL   Hemoglobin  12.8 (Cruz) 13.0 - 17.0 g/dL   HCT 42.5 39.0 - 52.0 %   MCV 91.0 78.0 - 100.0 fL   MCH 27.4 26.0 - 34.0 pg   MCHC 30.1 30.0 - 36.0 g/dL   RDW 12.8 11.5 - 15.5 %   Platelets 188 150 - 400 K/uL   Neutrophils Relative % 83 %   Neutro Abs 9.7 (H) 1.7 - 7.7 K/uL   Lymphocytes Relative 11 %   Lymphs Abs 1.3 0.7 - 4.0 K/uL   Monocytes Relative 4 %   Monocytes Absolute 0.5 0.1 - 1.0 K/uL   Eosinophils Relative 2 %   Eosinophils Absolute 0.2 0.0 - 0.7 K/uL   Basophils Relative 0 %   Basophils Absolute 0.0 0.0 - 0.1 K/uL  Salicylate level     Status: None   Collection Time: 08/16/15 10:06 AM  Result Value Ref Range   Salicylate Lvl <6.2 2.8 - 30.0 mg/dL  Acetaminophen level     Status: Abnormal   Collection Time: 08/16/15 10:06 AM  Result Value Ref Range   Acetaminophen (Tylenol), Serum <10 (Cruz) 10 - 30 ug/mL    Comment:        THERAPEUTIC CONCENTRATIONS VARY SIGNIFICANTLY. A RANGE OF 10-30 ug/mL MAY BE AN EFFECTIVE CONCENTRATION FOR MANY PATIENTS. HOWEVER, SOME ARE BEST TREATED AT CONCENTRATIONS OUTSIDE THIS RANGE. ACETAMINOPHEN CONCENTRATIONS >150 ug/mL AT 4 HOURS AFTER INGESTION AND >50 ug/mL AT 12 HOURS AFTER INGESTION ARE OFTEN ASSOCIATED WITH TOXIC REACTIONS.   Blood gas, arterial     Status: Abnormal   Collection Time: 08/16/15 10:15 AM  Result Value Ref Range   O2 Content 3.0 Cruz/min   pH, Arterial 7.330 (Cruz) 7.350 - 7.450   pCO2 arterial 74.9 (HH) 35.0 - 45.0 mmHg    Comment: CRITICAL RESULT CALLED TO, READ BACK BY AND VERIFIED WITH: TIFFANY OSBORNE RN BY ROBIN POWELL RRT ONN 08/16/15 AT 1022    pO2, Arterial 62.6 (Cruz) 80.0 - 100.0 mmHg   Bicarbonate 33.5 (H) 20.0 - 24.0 mEq/Cruz   Acid-Base Excess 12.2 (H) 0.0 - 2.0 mmol/Cruz   O2 Saturation 89.6 %   Collection site RIGHT RADIAL    Drawn by [id number]    Sample type ARTERIAL    Allens test (pass/fail) PASS PASS  Blood culture (routine  x 2)     Status: None (Preliminary result)   Collection Time: 08/16/15 12:08 PM  Result Value  Ref Range   Specimen Description BLOOD LEFT ANTECUBITAL    Special Requests BOTTLES DRAWN AEROBIC AND ANAEROBIC 4CC EACH    Culture NO GROWTH 1 DAY    Report Status PENDING   Lactic acid, plasma     Status: None   Collection Time: 08/16/15 12:09 PM  Result Value Ref Range   Lactic Acid, Venous 0.9 0.5 - 2.0 mmol/Cruz  Blood culture (routine x 2)     Status: None (Preliminary result)   Collection Time: 08/16/15 12:27 PM  Result Value Ref Range   Specimen Description BLOOD RIGHT HAND    Special Requests BOTTLES DRAWN AEROBIC ONLY 4CC    Culture NO GROWTH 1 DAY    Report Status PENDING   Blood gas, arterial     Status: Abnormal   Collection Time: 08/16/15  2:40 PM  Result Value Ref Range   FIO2 50.00    Delivery systems VENTILATOR    Mode PRESSURE REGULATED VOLUME CONTROL    VT 500.0 mL   LHR 16 resp/min   Peep/cpap 5.0 cm H20   pH, Arterial 7.360 7.350 - 7.450   pCO2 arterial 67.3 (HH) 35.0 - 45.0 mmHg    Comment: CRITICAL RESULT CALLED TO, READ BACK BY AND VERIFIED WITH: TIFFANY OSBORNE RN BY ROBIN POWELL RRT ON 08/16/15 AT 1446    pO2, Arterial 58.3 (Cruz) 80.0 - 100.0 mmHg   Bicarbonate 32.6 (H) 20.0 - 24.0 mEq/Cruz   Acid-Base Excess 11.3 (H) 0.0 - 2.0 mmol/Cruz   O2 Saturation 87.6 %   Collection site LEFT RADIAL    Drawn by [id number]    Sample type ARTERIAL    Allens test (pass/fail) PASS PASS  Legionella Pneumophila Serogp 1 Ur Ag     Status: None   Collection Time: 08/16/15  4:00 PM  Result Value Ref Range   Cruz. pneumophila Serogp 1 Ur Ag Negative Negative    Comment: (NOTE) Performed At: [facility] [address] [place], [place] [id number] Lindon Romp MD ZH:[id number]    Source of Sample URINE, RANDOM   Strep pneumoniae urinary antigen     Status: None   Collection Time: 08/16/15  4:00 PM  Result Value Ref Range   Strep Pneumo Urinary Antigen NEGATIVE NEGATIVE    Comment:        Infection due to S. pneumoniae cannot be absolutely ruled out since the  antigen present may be below the detection limit of the test. Performed at [facility] Performed at [facility]   MRSA PCR Screening     Status: None   Collection Time: 08/16/15  4:00 PM  Result Value Ref Range   MRSA by PCR NEGATIVE NEGATIVE    Comment:        The GeneXpert MRSA Assay (FDA approved for NASAL specimens only), is one component of a comprehensive MRSA colonization surveillance program. It is not intended to diagnose MRSA infection nor to guide or monitor treatment for MRSA infections.   Culture, respiratory (NON-Expectorated)     Status: None (Preliminary result)   Collection Time: 08/16/15  4:25 PM  Result Value Ref Range   Specimen Description TRACHEAL ASPIRATE    Special Requests NONE    Gram Stain      MODERATE WBC PRESENT,BOTH PMN AND MONONUCLEAR RARE SQUAMOUS EPITHELIAL CELLS PRESENT FEW GRAM POSITIVE COCCI IN PAIRS IN CLUSTERS Performed  at [facility]    Culture PENDING    Report Status PENDING   Glucose, capillary     Status: Abnormal   Collection Time: 08/16/15  4:35 PM  Result Value Ref Range   Glucose-Capillary 300 (H) 65 - 99 mg/dL   Comment 1 Notify RN   Glucose, capillary     Status: Abnormal   Collection Time: 08/16/15  8:03 PM  Result Value Ref Range   Glucose-Capillary 283 (H) 65 - 99 mg/dL   Comment 1 Notify RN    Comment 2 Document in Chart   Glucose, capillary     Status: Abnormal   Collection Time: 08/16/15 11:50 PM  Result Value Ref Range   Glucose-Capillary 272 (H) 65 - 99 mg/dL  Blood gas, arterial     Status: Abnormal   Collection Time: 08/17/15  3:15 AM  Result Value Ref Range   FIO2 0.45    Delivery systems VENTILATOR    Mode PRESSURE REGULATED VOLUME CONTROL    VT 500 mL   LHR 16.0 resp/min   Peep/cpap 5.0 cm H20   pH, Arterial 7.405 7.350 - 7.450   pCO2 arterial 57.1 (HH) 35.0 - 45.0 mmHg    Comment: CRITICAL RESULT CALLED TO, READ BACK BY AND VERIFIED WITH: RAIMODI CUMMINGS,RN BY  BFRETWELL RRT,RCP ON 08/17/15 AT 0324    pO2, Arterial 75.3 (Cruz) 80.0 - 100.0 mmHg   Bicarbonate 32.6 (H) 20.0 - 24.0 mEq/Cruz   Acid-base deficit 10.0 (H) 0.0 - 2.0 mmol/Cruz   O2 Saturation 94.8 %   Patient temperature 38.3    Collection site RIGHT RADIAL    Drawn by [id number]    Sample type ARTERIAL DRAW    Allens test (pass/fail) PASS PASS  Glucose, capillary     Status: Abnormal   Collection Time: 08/17/15  3:52 AM  Result Value Ref Range   Glucose-Capillary 243 (H) 65 - 99 mg/dL  Comprehensive metabolic panel     Status: Abnormal   Collection Time: 08/17/15  4:45 AM  Result Value Ref Range   Sodium 138 135 - 145 mmol/Cruz   Potassium 4.3 3.5 - 5.1 mmol/Cruz   Chloride 96 (Cruz) 101 - 111 mmol/Cruz   CO2 33 (H) 22 - 32 mmol/Cruz   Glucose, Bld 263 (H) 65 - 99 mg/dL   BUN 11 6 - 20 mg/dL   Creatinine, Ser 1.03 0.61 - 1.24 mg/dL   Calcium 8.6 (Cruz) 8.9 - 10.3 mg/dL   Total Protein 6.6 6.5 - 8.1 g/dL   Albumin 3.5 3.5 - 5.0 g/dL   AST 12 (Cruz) 15 - 41 U/Cruz   ALT 10 (Cruz) 17 - 63 U/Cruz   Alkaline Phosphatase 70 38 - 126 U/Cruz   Total Bilirubin 0.7 0.3 - 1.2 mg/dL   GFR calc non Af Amer >60 >60 mL/min   GFR calc Af Amer >60 >60 mL/min    Comment: (NOTE) The eGFR has been calculated using the CKD EPI equation. This calculation has not been validated in all clinical situations. eGFR's persistently <60 mL/min signify possible Chronic Kidney Disease.    Anion gap 9 5 - 15  CBC     Status: Abnormal   Collection Time: 08/17/15  4:45 AM  Result Value Ref Range   WBC 14.7 (H) 4.0 - 10.5 K/uL   RBC 4.40 4.22 - 5.81 MIL/uL   Hemoglobin 12.0 (Cruz) 13.0 - 17.0 g/dL   HCT 40.4 39.0 - 52.0 %   MCV 91.8 78.0 - 100.0  fL   MCH 27.3 26.0 - 34.0 pg   MCHC 29.7 (Cruz) 30.0 - 36.0 g/dL   RDW 12.8 11.5 - 15.5 %   Platelets 217 150 - 400 K/uL  Glucose, capillary     Status: Abnormal   Collection Time: 08/17/15  7:26 AM  Result Value Ref Range   Glucose-Capillary 239 (H) 65 - 99 mg/dL   Comment 1 Notify RN    Comment 2  Document in Chart   Glucose, capillary     Status: Abnormal   Collection Time: 08/17/15 11:12 AM  Result Value Ref Range   Glucose-Capillary 251 (H) 65 - 99 mg/dL   Comment 1 Notify RN    Comment 2 Document in Chart   Glucose, capillary     Status: Abnormal   Collection Time: 08/17/15  4:09 PM  Result Value Ref Range   Glucose-Capillary 286 (H) 65 - 99 mg/dL   Comment 1 Notify RN    Comment 2 Document in Chart   Glucose, capillary     Status: Abnormal   Collection Time: 08/17/15  7:41 PM  Result Value Ref Range   Glucose-Capillary 259 (H) 65 - 99 mg/dL   Comment 1 Document in Chart   Glucose, capillary     Status: Abnormal   Collection Time: 08/18/15 12:21 AM  Result Value Ref Range   Glucose-Capillary 258 (H) 65 - 99 mg/dL   Comment 1 Notify RN   Blood gas, arterial     Status: Abnormal   Collection Time: 08/18/15  3:25 AM  Result Value Ref Range   FIO2 40.00    Delivery systems VENTILATOR    Mode PRESSURE REGULATED VOLUME CONTROL    VT 500 mL   LHR 16 resp/min   Peep/cpap 5.0 cm H20   pH, Arterial 7.384 7.350 - 7.450   pCO2 arterial 58.3 (HH) 35.0 - 45.0 mmHg    Comment: CRITICAL RESULT CALLED TO, READ BACK BY AND VERIFIED WITH:  SHERRI H RN  BY K KNICK RRT RCP ON 08/18/2015 AT 0335    pO2, Arterial 63.8 (Cruz) 80.0 - 100.0 mmHg   Bicarbonate 31.5 (H) 20.0 - 24.0 mEq/Cruz   TCO2 13.5 0 - 100 mmol/Cruz   Acid-Base Excess 8.8 (H) 0.0 - 2.0 mmol/Cruz   O2 Saturation 91.1 %   Patient temperature 37.0    Collection site RIGHT RADIAL    Drawn by [id number]    Sample type ARTERIAL    Allens test (pass/fail) PASS PASS  Glucose, capillary     Status: Abnormal   Collection Time: 08/18/15  3:43 AM  Result Value Ref Range   Glucose-Capillary 231 (H) 65 - 99 mg/dL   Comment 1 Notify RN   Basic metabolic panel     Status: Abnormal   Collection Time: 08/18/15  5:27 AM  Result Value Ref Range   Sodium 142 135 - 145 mmol/Cruz   Potassium 4.8 3.5 - 5.1 mmol/Cruz   Chloride 102 101 - 111 mmol/Cruz    CO2 34 (H) 22 - 32 mmol/Cruz   Glucose, Bld 237 (H) 65 - 99 mg/dL   BUN 12 6 - 20 mg/dL   Creatinine, Ser 0.89 0.61 - 1.24 mg/dL   Calcium 8.6 (Cruz) 8.9 - 10.3 mg/dL   GFR calc non Af Amer >60 >60 mL/min   GFR calc Af Amer >60 >60 mL/min    Comment: (NOTE) The eGFR has been calculated using the CKD EPI equation. This calculation has not been validated in  all clinical situations. eGFR's persistently <60 mL/min signify possible Chronic Kidney Disease.    Anion gap 6 5 - 15  CBC     Status: Abnormal   Collection Time: 08/18/15  5:27 AM  Result Value Ref Range   WBC 10.8 (H) 4.0 - 10.5 K/uL   RBC 4.17 (Cruz) 4.22 - 5.81 MIL/uL   Hemoglobin 11.4 (Cruz) 13.0 - 17.0 g/dL   HCT 38.5 (Cruz) 39.0 - 52.0 %   MCV 92.3 78.0 - 100.0 fL   MCH 27.3 26.0 - 34.0 pg   MCHC 29.6 (Cruz) 30.0 - 36.0 g/dL   RDW 13.1 11.5 - 15.5 %   Platelets 186 150 - 400 K/uL  Glucose, capillary     Status: Abnormal   Collection Time: 08/18/15  7:22 AM  Result Value Ref Range   Glucose-Capillary 210 (H) 65 - 99 mg/dL   Comment 1 Notify RN    Comment 2 Document in Chart     ABGS  Recent Labs  08/18/15 0325  PHART 7.384  PO2ART 63.8*  TCO2 13.5  HCO3 31.5*   CULTURES Recent Results (from the past 240 hour(s))  Blood culture (routine x 2)     Status: None (Preliminary result)   Collection Time: 08/16/15 12:08 PM  Result Value Ref Range Status   Specimen Description BLOOD LEFT ANTECUBITAL  Final   Special Requests BOTTLES DRAWN AEROBIC AND ANAEROBIC 4CC EACH  Final   Culture NO GROWTH 1 DAY  Final   Report Status PENDING  Incomplete  Blood culture (routine x 2)     Status: None (Preliminary result)   Collection Time: 08/16/15 12:27 PM  Result Value Ref Range Status   Specimen Description BLOOD RIGHT HAND  Final   Special Requests BOTTLES DRAWN AEROBIC ONLY 4CC  Final   Culture NO GROWTH 1 DAY  Final   Report Status PENDING  Incomplete  MRSA PCR Screening     Status: None   Collection Time: 08/16/15  4:00 PM   Result Value Ref Range Status   MRSA by PCR NEGATIVE NEGATIVE Final    Comment:        The GeneXpert MRSA Assay (FDA approved for NASAL specimens only), is one component of a comprehensive MRSA colonization surveillance program. It is not intended to diagnose MRSA infection nor to guide or monitor treatment for MRSA infections.   Culture, respiratory (NON-Expectorated)     Status: None (Preliminary result)   Collection Time: 08/16/15  4:25 PM  Result Value Ref Range Status   Specimen Description TRACHEAL ASPIRATE  Final   Special Requests NONE  Final   Gram Stain   Final    MODERATE WBC PRESENT,BOTH PMN AND MONONUCLEAR RARE SQUAMOUS EPITHELIAL CELLS PRESENT FEW GRAM POSITIVE COCCI IN PAIRS IN CLUSTERS Performed at [facility]    Culture PENDING  Incomplete   Report Status PENDING  Incomplete   Studies/Results: Ct Head Wo Contrast  08/16/2015  CLINICAL DATA:  Altered mental status.  Possible overdose. EXAM: CT HEAD WITHOUT CONTRAST TECHNIQUE: Contiguous axial images were obtained from the base of the skull through the vertex without intravenous contrast. COMPARISON:  CT 12/05/2014 FINDINGS: Mild atrophy. Negative for hydrocephalus. Mild chronic microvascular ischemic change in the white matter. Negative for acute infarct.  Negative for hemorrhage or mass. Negative calvarium. IMPRESSION: Atrophy and chronic microvascular ischemia.  No acute abnormality. Electronically Signed   By: Franchot Gallo M.D.   On: 08/16/2015 15:13   Dg Chest [facility]  08/18/2015  CLINICAL DATA:  Respiratory distress, chronic respiratory failure, acute encephalopathy, history of COPD, coronary artery disease, diabetes, and morbid obesity. EXAM: PORTABLE CHEST 1 VIEW COMPARISON:  Portable chest x-ray of August 16, 2015 FINDINGS: The lungs are well-expanded. The cardiac silhouette remains enlarged. The retrocardiac region on the left is dense with obscuration of the hemidiaphragm. The pulmonary  vascularity is not clearly engorged. The endotracheal tube tip lies 3.8 cm above the carina. The esophagogastric tube tip projects below the inferior margin of the image. IMPRESSION: Improved aeration of the right lung. There is persistent left lower lobe atelectasis with new small left pleural effusion. The support tubes are in reasonable position. Electronically Signed   By: David  Martinique M.D.   On: 08/18/2015 08:08   Dg Chest Portable 1 View  08/16/2015  CLINICAL DATA:  Drug overdose.  Intubation EXAM: PORTABLE CHEST 1 VIEW COMPARISON:  12/05/2014 FINDINGS: Endotracheal tube in good position. NG tube enters the stomach with the tip not visualized. Right lower lobe airspace disease may represent atelectasis or pneumonia. Possible small right effusion Negative for heart failure.  Left lung clear. IMPRESSION: Endotracheal tube in good position. Right lower lobe airspace consolidation may represent atelectasis or pneumonia. Follow-up recommended. Electronically Signed   By: Franchot Gallo M.D.   On: 08/16/2015 11:48    Medications:  Prior to Admission:  Prescriptions prior to admission  Medication Sig Dispense Refill Last Dose  . aspirin 81 MG chewable tablet Chew 81 mg by mouth every morning.    08/15/2015 at Unknown time  . cholecalciferol (VITAMIN D) 1000 UNITS tablet Take 2,000 Units by mouth daily.   08/15/2015 at Unknown time  . Cinnamon 500 MG capsule Take 1,000 mg by mouth every morning.    08/15/2015 at Unknown time  . citalopram (CELEXA) 20 MG tablet Take 20 mg by mouth every morning.   08/15/2015 at Unknown time  . clopidogrel (PLAVIX) 75 MG tablet Take 75 mg by mouth every morning.   08/15/2015 at Unknown time  . furosemide (LASIX) 40 MG tablet Take 1 tablet (40 mg total) by mouth daily. 30 tablet 1 08/15/2015 at Unknown time  . gabapentin (NEURONTIN) 300 MG capsule Take 300 mg by mouth 4 (four) times daily.    08/15/2015 at Unknown time  . Insulin Human (INSULIN PUMP) SOLN Inject into  the skin continuous. Humalog   08/16/2015 at Unknown time  . LORazepam (ATIVAN) 1 MG tablet Take 0.5 tablets (0.5 mg total) by mouth 2 (two) times daily as needed for anxiety. 20 tablet 0 08/15/2015 at Unknown time  . metoprolol (LOPRESSOR) 50 MG tablet Take 50 mg by mouth 2 (two) times daily.   08/15/2015 at Unknown time  . pantoprazole (PROTONIX) 40 MG tablet Take 40 mg by mouth 2 (two) times daily.   08/15/2015 at Unknown time  . simvastatin (ZOCOR) 40 MG tablet Take 40 mg by mouth every evening.   08/15/2015 at Unknown time  . sodium chloride 1 G tablet Take 1 g by mouth 2 (two) times daily.   08/15/2015 at Unknown time  . terazosin (HYTRIN) 1 MG capsule Take 1 mg by mouth 2 (two) times daily.    08/15/2015 at Unknown time  . tiotropium (SPIRIVA HANDIHALER) 18 MCG inhalation capsule Place 1 capsule (18 mcg total) into inhaler and inhale every morning. 30 capsule 12 08/15/2015 at Unknown time  . traZODone (DESYREL) 150 MG tablet Take 300 mg by mouth at bedtime.    08/15/2015 at Unknown time  .  ziprasidone (GEODON) 80 MG capsule Take 80 mg by mouth every evening.   08/15/2015 at Unknown time  . albuterol (PROVENTIL HFA;VENTOLIN HFA) 108 (90 BASE) MCG/ACT inhaler Inhale 2 puffs into the lungs every 6 (six) hours as needed for wheezing or shortness of breath. 1 Inhaler 2 unknown  . albuterol (PROVENTIL) (2.5 MG/3ML) 0.083% nebulizer solution Take 3 mLs (2.5 mg total) by nebulization every 6 (six) hours as needed for wheezing or shortness of breath. 75 mL 0 unknown  . HYDROcodone-acetaminophen (NORCO/VICODIN) 5-325 MG per tablet Take 1 tablet by mouth every 4 (four) hours as needed for moderate pain.    unknown  . Tetrahydrozoline HCl (VISINE OP) Apply 1 drop to eye daily as needed (irritation).    unknown  . triamcinolone cream (KENALOG) 0.1 % Apply 1 application topically 2 (two) times daily as needed (for irritation).    unknown   Scheduled: . albuterol  2.5 mg Nebulization Q4H  .  ampicillin-sulbactam (UNASYN) IV  3 g Intravenous Q6H  . antiseptic oral rinse  7 mL Mouth Rinse QID  . aspirin  81 mg Per Tube q morning - 10a  . chlorhexidine gluconate  15 mL Mouth Rinse BID  . clopidogrel  75 mg Oral q morning - 10a  . enoxaparin (LOVENOX) injection  40 mg Subcutaneous Q24H  . guaiFENesin  1,200 mg Oral BID  . insulin aspart  0-20 Units Subcutaneous 6 times per day  . insulin glargine  25 Units Subcutaneous BID  . methylPREDNISolone (SOLU-MEDROL) injection  40 mg Intravenous Q12H  . metoprolol  50 mg Oral BID  . pantoprazole (PROTONIX) IV  40 mg Intravenous Daily  . simvastatin  40 mg Oral QPM  . sodium chloride  3 mL Intravenous Q12H  . terazosin  1 mg Oral BID  . tiotropium  18 mcg Inhalation q morning - 10a  . ziprasidone  80 mg Oral QPM   Continuous: . sodium chloride 50 mL/hr at 08/18/15 0600  . fentaNYL infusion INTRAVENOUS 250 mcg/hr (08/18/15 0811)  . midazolam (VERSED) infusion 2 mg/hr (08/18/15 0600)   MMH:[id number] **OR** acetaminophen, albuterol, fentaNYL, fentaNYL (SUBLIMAZE) injection, midazolam  Assesment: He has ventilator dependent respiratory failure. He is on 50% oxygen. He does not meet weaning criteria right now. He has COPD at baseline and obesity hypoventilation and aspiration pneumonia which are all increasing his problem. He had an overdose of lorazepam and has acute encephalopathy from that. This is all complicated by his paranoid schizophrenia. Principal Problem:   Acute respiratory failure with hypercapnia (HCC) Active Problems:   Obstructive sleep apnea   Paranoid schizophrenia ([place])   DM (diabetes mellitus) ([place])   Chronic respiratory failure 02 dep with hypercarbia   Morbid obesity ([place])   Acute encephalopathy   COPD (chronic obstructive pulmonary disease) (HCC)   Obesity hypoventilation syndrome (HCC)   Aspiration pneumonia (HCC)   Lorazepam overdose    Plan: No opportunity for weaning right now. I will see if we can  wean his oxygen and then make an attempt at trying to see we can do about coming off the ventilator.    LOS: 2 days   Rick Cruz 08/18/2015, 8:57 AM

## 2015-08-18 NOTE — Progress Notes (Signed)
CRITICAL VALUE ALERT  Critical value received:  CO2 58.3  Date of notification:  08/18/2015  Time of notification:  0330  Critical value read back:Yes.    Nurse who received alert:  Wonda Cheng, RN  MD notified (1st page):  Dr Deterding  Time of first page:  0345  MD notified (2nd page):  Time of second page:  Responding MD:  Dr Jimmy Footman  Time MD responded:  0345  No new orders received

## 2015-08-18 NOTE — Progress Notes (Addendum)
Inpatient Diabetes Program Recommendations  AACE/ADA: New Consensus Statement on Inpatient Glycemic Control (2015)  Target Ranges:  Prepandial:   less than 140 mg/dL      Peak postprandial:   less than 180 mg/dL (1-2 hours)      Critically ill patients:  140 - 180 mg/dL   Results for SYLVESTA, KOTTMAN (MRN [id number]) as of 08/18/2015 10:43  Ref. Range 08/16/2015 23:50 08/17/2015 03:52 08/17/2015 07:26 08/17/2015 11:12 08/17/2015 16:09 08/17/2015 19:41  Glucose-Capillary Latest Ref Range: 65-99 mg/dL 272 (H) 243 (H) 239 (H) 251 (H) 286 (H) 259 (H)    Results for ASAAD, WOHLER (MRN [id number]) as of 08/18/2015 10:43  Ref. Range 08/18/2015 00:21 08/18/2015 03:43 08/18/2015 07:22  Glucose-Capillary Latest Ref Range: 65-99 mg/dL 258 (H) 231 (H) 210 (H)    Outpatient Diabetes medications: Humalog Insulin pump  Current Insulin Orders: Novolog Resistant SSI (0-20 units) Q4 hours      Lantus 25 units bid     -Patient uses insulin pump at home taking an equivalent of 96 units of basal insulin in a 24 hour period.  -Note Lantus 25 units bid started yesterday.  -Glucose levels still consistently elevated >200 mg/dl.  -Haven't started tube feeds just yet.  -Patient still getting IV Solumedrol 40 mg bid.    MD- Please consider the following in-hospital insulin adjustments:  Increase Lantus to 40 units bid (this would be about 80% of the total basal insulin patient receives on his insulin pump at home)     --Will follow patient during hospitalization--  Wyn Quaker RN, MSN, CDE Diabetes Coordinator Inpatient Glycemic Control Team Team Pager: [phone] (8a-5p)

## 2015-08-18 NOTE — Progress Notes (Signed)
[facility] PROGRESS NOTE  Rick Cruz [id number] DOB: 13-Nov-1945 DOA: 08/16/2015 PCP: [name], MD  Assessment/Plan: 1. Ativan overdose, intention unknown with resultant resp failure, encephalopathy and aspiration pneumonia. Lactic acid normal, no evidence of sepsis. Dr. Sarajane Jews d/w poison control who recommends supportive care.  2. VDRF, Acute hypercapnic resp failure, resp acidosis secondary to Ativan overdose, complicated by oxygen-dependent COPD, OSA non-compliant with BiPAP. Patient is currently intubated and sedated. ABG 7.384/58.3/63.8. Pulmonology input appreciated. Will attempt daily weaning trials however he is not ready to wean today.  3. Suspected RLL/lobar aspiration pneumonia. He is currently afebrile. WBC of 10.8. Lactic acid WNL. Continue steroids and albuterol   per pulmonology. Will continue Unasyn. MRSA PCR was negative. 4. Acute encephalopathy, secondary to above.  5. COPD, chronic hypoxic resp failure. Patient is on bronchodilators and steroids. 6. DM on insulin pump, removed. CBG stable, will continue Lantus 25U BID and SSI to Q4H. 7. Depression, PMH addiction, alcohol, pain meds. Alcohol remote. No recent BZD use until 11/28 8. Chronic pain on hydrocode/APAP. Acetaminophen level normal. 9. Essential HTN, continue Metoprolol.  10. Schizophrenia, continue Geodon.   11. HLD, continue statin.  12. OSA, non-compliant.   Code Status: Full  DVT prophylaxis: Lovenox Family Communication: Wife at bedside. Discussed care plan with her. Patient is intubated and sedated.  Disposition Plan: Continue to monitor in ICU.    Consultants:  Pulmonology  Procedures:  ETT 11/29>>  Antibiotics:  Zosyn 11/29>>11/30  Unasyn 11/30>>  HPI/Subjective: History unobtainable because patient is intubated and sedated.   Objective: Filed Vitals:   08/18/15 0630 08/18/15 0757  BP: 111/56   Pulse: 72   Temp:  98.3 F (36.8 C)  Resp: 16     Intake/Output  Summary (Last 24 hours) at 08/18/15 0811 Last data filed at 08/18/15 0600  Gross per 24 hour  Intake 2043.54 ml  Output   1150 ml  Net 893.54 ml   Filed Weights   08/16/15 1926 08/17/15 0500 08/18/15 0500  Weight: 104.6 kg (230 lb 9.6 oz) 143.6 kg (316 lb 9.3 oz) 139.5 kg (307 lb 8.7 oz)    Exam:   General: NAD, looks comfortable, sedated and intubated.  Cardiovascular: RRR, S1, S2   Respiratory: clear bilaterally, No wheezing, rales or rhonchi  Abdomen: soft, non tender, no distention , bowel sounds normal  Musculoskeletal: No edema b/l  Data Reviewed: Basic Metabolic Panel:  Recent Labs Lab 08/16/15 1006 08/17/15 0445 08/18/15 0527  NA 136 138 142  K 5.0 4.3 4.8  CL 90* 96* 102  CO2 37* 33* 34*  GLUCOSE 289* 263* 237*  BUN 9 11 12   CREATININE 0.89 1.03 0.89  CALCIUM 8.9 8.6* 8.6*   Liver Function Tests:  Recent Labs Lab 08/16/15 1006 08/17/15 0445  AST 10* 12*  ALT 11* 10*  ALKPHOS 70 70  BILITOT 0.7 0.7  PROT 7.2 6.6  ALBUMIN 3.9 3.5   CBC:  Recent Labs Lab 08/16/15 1006 08/17/15 0445 08/18/15 0527  WBC 11.8* 14.7* 10.8*  NEUTROABS 9.7*  --   --   HGB 12.8* 12.0* 11.4*  HCT 42.5 40.4 38.5*  MCV 91.0 91.8 92.3  PLT 188 217 186   CBG:  Recent Labs Lab 08/17/15 1609 08/17/15 1941 08/18/15 0021 08/18/15 0343 08/18/15 0722  GLUCAP 286* 259* 258* 231* 210*    Recent Results (from the past 240 hour(s))  Blood culture (routine x 2)     Status: None (Preliminary result)   Collection Time: 08/16/15  12:08 PM  Result Value Ref Range Status   Specimen Description BLOOD LEFT ANTECUBITAL  Final   Special Requests BOTTLES DRAWN AEROBIC AND ANAEROBIC 4CC EACH  Final   Culture NO GROWTH 1 DAY  Final   Report Status PENDING  Incomplete  Blood culture (routine x 2)     Status: None (Preliminary result)   Collection Time: 08/16/15 12:27 PM  Result Value Ref Range Status   Specimen Description BLOOD RIGHT HAND  Final   Special Requests  BOTTLES DRAWN AEROBIC ONLY 4CC  Final   Culture NO GROWTH 1 DAY  Final   Report Status PENDING  Incomplete  MRSA PCR Screening     Status: None   Collection Time: 08/16/15  4:00 PM  Result Value Ref Range Status   MRSA by PCR NEGATIVE NEGATIVE Final    Comment:        The GeneXpert MRSA Assay (FDA approved for NASAL specimens only), is one component of a comprehensive MRSA colonization surveillance program. It is not intended to diagnose MRSA infection nor to guide or monitor treatment for MRSA infections.   Culture, respiratory (NON-Expectorated)     Status: None (Preliminary result)   Collection Time: 08/16/15  4:25 PM  Result Value Ref Range Status   Specimen Description TRACHEAL ASPIRATE  Final   Special Requests NONE  Final   Gram Stain   Final    MODERATE WBC PRESENT,BOTH PMN AND MONONUCLEAR RARE SQUAMOUS EPITHELIAL CELLS PRESENT FEW GRAM POSITIVE COCCI IN PAIRS IN CLUSTERS Performed at [facility]    Culture PENDING  Incomplete   Report Status PENDING  Incomplete     Studies: Ct Head Wo Contrast  08/16/2015  CLINICAL DATA:  Altered mental status.  Possible overdose. EXAM: CT HEAD WITHOUT CONTRAST TECHNIQUE: Contiguous axial images were obtained from the base of the skull through the vertex without intravenous contrast. COMPARISON:  CT 12/05/2014 FINDINGS: Mild atrophy. Negative for hydrocephalus. Mild chronic microvascular ischemic change in the white matter. Negative for acute infarct.  Negative for hemorrhage or mass. Negative calvarium. IMPRESSION: Atrophy and chronic microvascular ischemia.  No acute abnormality. Electronically Signed   By: Franchot Gallo M.D.   On: 08/16/2015 15:13   Dg Chest Port 1 View  08/18/2015  CLINICAL DATA:  Respiratory distress, chronic respiratory failure, acute encephalopathy, history of COPD, coronary artery disease, diabetes, and morbid obesity. EXAM: PORTABLE CHEST 1 VIEW COMPARISON:  Portable chest x-ray of August 16, 2015  FINDINGS: The lungs are well-expanded. The cardiac silhouette remains enlarged. The retrocardiac region on the left is dense with obscuration of the hemidiaphragm. The pulmonary vascularity is not clearly engorged. The endotracheal tube tip lies 3.8 cm above the carina. The esophagogastric tube tip projects below the inferior margin of the image. IMPRESSION: Improved aeration of the right lung. There is persistent left lower lobe atelectasis with new small left pleural effusion. The support tubes are in reasonable position. Electronically Signed   By: David  Martinique M.D.   On: 08/18/2015 08:08   Dg Chest Portable 1 View  08/16/2015  CLINICAL DATA:  Drug overdose.  Intubation EXAM: PORTABLE CHEST 1 VIEW COMPARISON:  12/05/2014 FINDINGS: Endotracheal tube in good position. NG tube enters the stomach with the tip not visualized. Right lower lobe airspace disease may represent atelectasis or pneumonia. Possible small right effusion Negative for heart failure.  Left lung clear. IMPRESSION: Endotracheal tube in good position. Right lower lobe airspace consolidation may represent atelectasis or pneumonia. Follow-up recommended. Electronically Signed  By: Franchot Gallo M.D.   On: 08/16/2015 11:48    Scheduled Meds: . albuterol  2.5 mg Nebulization Q4H  . ampicillin-sulbactam (UNASYN) IV  3 g Intravenous Q6H  . antiseptic oral rinse  7 mL Mouth Rinse QID  . aspirin  81 mg Per Tube q morning - 10a  . chlorhexidine gluconate  15 mL Mouth Rinse BID  . clopidogrel  75 mg Oral q morning - 10a  . enoxaparin (LOVENOX) injection  40 mg Subcutaneous Q24H  . guaiFENesin  1,200 mg Oral BID  . insulin aspart  0-20 Units Subcutaneous 6 times per day  . insulin glargine  25 Units Subcutaneous BID  . methylPREDNISolone (SOLU-MEDROL) injection  40 mg Intravenous Q12H  . metoprolol  50 mg Oral BID  . pantoprazole (PROTONIX) IV  40 mg Intravenous Daily  . simvastatin  40 mg Oral QPM  . sodium chloride  3 mL Intravenous  Q12H  . terazosin  1 mg Oral BID  . tiotropium  18 mcg Inhalation q morning - 10a  . ziprasidone  80 mg Oral QPM   Continuous Infusions: . sodium chloride 50 mL/hr at 08/18/15 0600  . fentaNYL infusion INTRAVENOUS 250 mcg/hr (08/18/15 0811)  . midazolam (VERSED) infusion 2 mg/hr (08/18/15 0600)    Principal Problem:   Acute respiratory failure with hypercapnia (HCC) Active Problems:   Obstructive sleep apnea   Paranoid schizophrenia ([place])   DM (diabetes mellitus) ([place])   Chronic respiratory failure 02 dep with hypercarbia   Morbid obesity ([place])   Acute encephalopathy   COPD (chronic obstructive pulmonary disease) (HCC)   Obesity hypoventilation syndrome (HCC)   Aspiration pneumonia (HCC)   Lorazepam overdose    Time spent: critical care: 30 minutes   Jehanzeb Memon. MD  [facility] Pager [phone]. If 7PM-7AM, please contact night-coverage at [email or web address], password [facility] 08/18/2015, 8:11 AM  LOS: 2 days    By signing my name below, I, Rosalie Doctor, attest that this documentation has been prepared under the direction and in the presence of [facility]. MD Electronically Signed: Rosalie Doctor, Scribe. 08/18/2015  10:49am  I, Dr. Kathie Dike, personally performed the services described in this documentaiton. All medical record entries made by the scribe were at my direction and in my presence. I have reviewed the chart and agree that the record reflects my personal performance and is accurate and complete  Kathie Dike, MD, 08/18/2015 11:06 AM

## 2015-08-19 ENCOUNTER — Inpatient Hospital Stay (HOSPITAL_COMMUNITY): Payer: Medicare Other

## 2015-08-19 LAB — GLUCOSE, CAPILLARY
GLUCOSE-CAPILLARY: 180 mg/dL — AB (ref 65–99)
Glucose-Capillary: 142 mg/dL — ABNORMAL HIGH (ref 65–99)
Glucose-Capillary: 122 mg/dL — ABNORMAL HIGH (ref 65–99)
Glucose-Capillary: 132 mg/dL — ABNORMAL HIGH (ref 65–99)
Glucose-Capillary: 156 mg/dL — ABNORMAL HIGH (ref 65–99)

## 2015-08-19 LAB — CBC
HEMATOCRIT: 36.6 % — AB (ref 39.0–52.0)
Hemoglobin: 10.9 g/dL — ABNORMAL LOW (ref 13.0–17.0)
MCH: 27.3 pg (ref 26.0–34.0)
MCHC: 29.8 g/dL — AB (ref 30.0–36.0)
MCV: 91.7 fL (ref 78.0–100.0)
Platelets: 174 10*3/uL (ref 150–400)
RBC: 3.99 MIL/uL — ABNORMAL LOW (ref 4.22–5.81)
RDW: 13.1 % (ref 11.5–15.5)
WBC: 10.2 10*3/uL (ref 4.0–10.5)

## 2015-08-19 LAB — BASIC METABOLIC PANEL
Anion gap: 5 (ref 5–15)
BUN: 15 mg/dL (ref 6–20)
CALCIUM: 8.7 mg/dL — AB (ref 8.9–10.3)
CHLORIDE: 104 mmol/L (ref 101–111)
CO2: 34 mmol/L — AB (ref 22–32)
CREATININE: 0.86 mg/dL (ref 0.61–1.24)
GFR calc Af Amer: >60 mL/min (ref >60)
GFR calc non Af Amer: >60 mL/min (ref >60)
GLUCOSE: 143 mg/dL — AB (ref 65–99)
Potassium: 4.3 mmol/L (ref 3.5–5.1)
Sodium: 143 mmol/L (ref 135–145)

## 2015-08-19 LAB — CULTURE, RESPIRATORY W GRAM STAIN

## 2015-08-19 LAB — CULTURE, RESPIRATORY: CULTURE: NORMAL

## 2015-08-19 MED ORDER — INSULIN ASPART 100 UNIT/ML ~~LOC~~ SOLN
0.0000 [IU] | Freq: Three times a day (TID) | SUBCUTANEOUS | Status: DC
  Administered 2015-08-20 (×2): 7 [IU] via SUBCUTANEOUS
  Administered 2015-08-20 – 2015-08-21 (×2): 4 [IU] via SUBCUTANEOUS

## 2015-08-19 MED ORDER — TRAZODONE HCL 50 MG PO TABS
300.0000 mg | ORAL_TABLET | Freq: Every day | ORAL | Status: DC
  Administered 2015-08-19 – 2015-08-20 (×2): 300 mg via ORAL
  Filled 2015-08-19 (×3): qty 6

## 2015-08-19 MED ORDER — LORAZEPAM 0.5 MG PO TABS
1.0000 mg | ORAL_TABLET | Freq: Once | ORAL | Status: AC
  Administered 2015-08-19: 1 mg via ORAL
  Filled 2015-08-19: qty 2

## 2015-08-19 MED ORDER — HYDRALAZINE HCL 20 MG/ML IJ SOLN
10.0000 mg | Freq: Four times a day (QID) | INTRAMUSCULAR | Status: DC | PRN
  Administered 2015-08-19: 10 mg via INTRAVENOUS
  Filled 2015-08-19: qty 1

## 2015-08-19 MED ORDER — LORAZEPAM 0.5 MG PO TABS
0.5000 mg | ORAL_TABLET | Freq: Two times a day (BID) | ORAL | Status: DC
  Administered 2015-08-20 – 2015-08-21 (×3): 0.5 mg via ORAL
  Filled 2015-08-19 (×3): qty 1

## 2015-08-19 NOTE — Progress Notes (Signed)
[facility] PROGRESS NOTE  Rick Cruz [id number] DOB: 05-18-1946 DOA: 08/16/2015 PCP: [name], MD  Assessment/Plan: 1. Ativan overdose, intention unknown with resultant resp failure, encephalopathy and aspiration pneumonia. Lactic acid normal, no evidence of sepsis. Dr. Sarajane Jews d/w poison control who recommends supportive care.  2. VDRF, Acute hypercapnic resp failure, resp acidosis secondary to Ativan overdose, complicated by oxygen-dependent COPD, OSA non-compliant with BiPAP. Patient is currently intubated and sedated. Pulmonology input appreciated. Will attempt daily weaning trials. 3. Suspected RLL/lobar aspiration pneumonia. He is currently afebrile. WBC of 10.2. Lactic acid WNL. Continue steroids and albuterol   per pulmonology. Will continue Unasyn. MRSA PCR was negative. 4. Acute encephalopathy, secondary to above.  5. COPD, chronic hypoxic resp failure. Patient is on bronchodilators and steroids. 6. DM on insulin pump, removed. Placed on Lantus and SSI. CBG stable, will continue current treatment.  7. Depression, PMH addiction, alcohol, pain meds. Alcohol remote. No recent BZD use until 11/28 8. Chronic pain on hydrocode/APAP. Acetaminophen level normal. 9. Essential HTN, continue Metoprolol.  10. Schizophrenia, continue Geodon.   11. HLD, continue statin.  12. OSA, non-compliant.   Code Status: Full  DVT prophylaxis: Lovenox Family Communication: No family at bedside. Patient is intubated and sedated.   Disposition Plan: Continue to monitor in ICU.    Consultants:  Pulmonology  Procedures:  ETT 11/29>>  Antibiotics:  Zosyn 11/29>>11/30  Unasyn 11/30>>  HPI/Subjective: History unobtainable because patient is intubated and sedated. He opens his eyes to voice but falls back asleep quickly.   Objective: Filed Vitals:   08/19/15 0500 08/19/15 0736  BP: 135/74   Pulse: 65   Temp:  99.3 F (37.4 C)  Resp: 16     Intake/Output Summary  (Last 24 hours) at 08/19/15 0756 Last data filed at 08/19/15 0500  Gross per 24 hour  Intake 885.19 ml  Output    550 ml  Net 335.19 ml   Filed Weights   08/17/15 0500 08/18/15 0500 08/19/15 0500  Weight: 143.6 kg (316 lb 9.3 oz) 139.5 kg (307 lb 8.7 oz) 140.6 kg (309 lb 15.5 oz)    Exam:   General: NAD,  Intubated. Opens his eyes to voice but quickly falls back asleep.   Cardiovascular: RRR, S1, S2   Respiratory: clear bilaterally, No wheezing, rales or rhonchi  Abdomen: soft, non tender, no distention , bowel sounds normal  Musculoskeletal: No edema b/l  Data Reviewed: Basic Metabolic Panel:  Recent Labs Lab 08/16/15 1006 08/17/15 0445 08/18/15 0527 08/19/15 0458  NA 136 138 142 143  K 5.0 4.3 4.8 4.3  CL 90* 96* 102 104  CO2 37* 33* 34* 34*  GLUCOSE 289* 263* 237* 143*  BUN 9 11 12 15   CREATININE 0.89 1.03 0.89 0.86  CALCIUM 8.9 8.6* 8.6* 8.7*   Liver Function Tests:  Recent Labs Lab 08/16/15 1006 08/17/15 0445  AST 10* 12*  ALT 11* 10*  ALKPHOS 70 70  BILITOT 0.7 0.7  PROT 7.2 6.6  ALBUMIN 3.9 3.5   CBC:  Recent Labs Lab 08/16/15 1006 08/17/15 0445 08/18/15 0527 08/19/15 0458  WBC 11.8* 14.7* 10.8* 10.2  NEUTROABS 9.7*  --   --   --   HGB 12.8* 12.0* 11.4* 10.9*  HCT 42.5 40.4 38.5* 36.6*  MCV 91.0 91.8 92.3 91.7  PLT 188 217 186 174   CBG:  Recent Labs Lab 08/18/15 1641 08/18/15 1947 08/18/15 2340 08/19/15 0401 08/19/15 0709  GLUCAP 187* 159* 157* 142* 122*  Recent Results (from the past 240 hour(s))  Blood culture (routine x 2)     Status: None (Preliminary result)   Collection Time: 08/16/15 12:08 PM  Result Value Ref Range Status   Specimen Description BLOOD LEFT ANTECUBITAL  Final   Special Requests BOTTLES DRAWN AEROBIC AND ANAEROBIC 4CC EACH  Final   Culture NO GROWTH 2 DAYS  Final   Report Status PENDING  Incomplete  Blood culture (routine x 2)     Status: None (Preliminary result)   Collection Time: 08/16/15  12:27 PM  Result Value Ref Range Status   Specimen Description BLOOD RIGHT HAND  Final   Special Requests BOTTLES DRAWN AEROBIC ONLY 4CC  Final   Culture NO GROWTH 2 DAYS  Final   Report Status PENDING  Incomplete  MRSA PCR Screening     Status: None   Collection Time: 08/16/15  4:00 PM  Result Value Ref Range Status   MRSA by PCR NEGATIVE NEGATIVE Final    Comment:        The GeneXpert MRSA Assay (FDA approved for NASAL specimens only), is one component of a comprehensive MRSA colonization surveillance program. It is not intended to diagnose MRSA infection nor to guide or monitor treatment for MRSA infections.   Culture, respiratory (NON-Expectorated)     Status: None (Preliminary result)   Collection Time: 08/16/15  4:25 PM  Result Value Ref Range Status   Specimen Description TRACHEAL ASPIRATE  Final   Special Requests NONE  Final   Gram Stain   Final    MODERATE WBC PRESENT,BOTH PMN AND MONONUCLEAR RARE SQUAMOUS EPITHELIAL CELLS PRESENT FEW GRAM POSITIVE COCCI IN PAIRS IN CLUSTERS Performed at [facility]    Culture   Final    Culture reincubated for better growth Performed at [facility]    Report Status PENDING  Incomplete     Studies: Dg Chest Port 1 View  08/19/2015  CLINICAL DATA:  Acute and chronic respiratory failure with oxygen dependency, acute encephalopathy, history of COPD and aspiration pneumonia EXAM: PORTABLE CHEST 1 VIEW COMPARISON:  Portable chest x-ray of August 18, 2015 FINDINGS: The lungs are adequately inflated. The interstitial markings are coarse. The retrocardiac region on the left remains dense. The cardiac silhouette remains enlarged. The pulmonary vascularity is not clearly engorged. The endotracheal tube tip lies 4 cm above the carina. The esophagogastric tube tip projects below the inferior margin of the image. IMPRESSION: Persistent left lower lobe atelectasis and possible small pleural effusion. Stable cardiomegaly. The  support tubes are in reasonable position. Electronically Signed   By: David  Martinique M.D.   On: 08/19/2015 07:44   Dg Chest Port 1 View  08/18/2015  CLINICAL DATA:  Respiratory distress, chronic respiratory failure, acute encephalopathy, history of COPD, coronary artery disease, diabetes, and morbid obesity. EXAM: PORTABLE CHEST 1 VIEW COMPARISON:  Portable chest x-ray of August 16, 2015 FINDINGS: The lungs are well-expanded. The cardiac silhouette remains enlarged. The retrocardiac region on the left is dense with obscuration of the hemidiaphragm. The pulmonary vascularity is not clearly engorged. The endotracheal tube tip lies 3.8 cm above the carina. The esophagogastric tube tip projects below the inferior margin of the image. IMPRESSION: Improved aeration of the right lung. There is persistent left lower lobe atelectasis with new small left pleural effusion. The support tubes are in reasonable position. Electronically Signed   By: David  Martinique M.D.   On: 08/18/2015 08:08    Scheduled Meds: . albuterol  2.5  mg Nebulization Q4H  . ampicillin-sulbactam (UNASYN) IV  3 g Intravenous Q6H  . antiseptic oral rinse  7 mL Mouth Rinse QID  . aspirin  81 mg Per Tube q morning - 10a  . chlorhexidine gluconate  15 mL Mouth Rinse BID  . clopidogrel  75 mg Oral q morning - 10a  . enoxaparin (LOVENOX) injection  40 mg Subcutaneous Q24H  . guaiFENesin  1,200 mg Oral BID  . insulin aspart  0-20 Units Subcutaneous 6 times per day  . insulin glargine  25 Units Subcutaneous BID  . methylPREDNISolone (SOLU-MEDROL) injection  40 mg Intravenous Q12H  . metoprolol  50 mg Oral BID  . pantoprazole (PROTONIX) IV  40 mg Intravenous Daily  . simvastatin  40 mg Oral QPM  . sodium chloride  3 mL Intravenous Q12H  . terazosin  1 mg Oral BID  . tiotropium  18 mcg Inhalation q morning - 10a  . ziprasidone  80 mg Oral QPM   Continuous Infusions: . fentaNYL infusion INTRAVENOUS 150 mcg/hr (08/19/15 0749)  . midazolam  (VERSED) infusion 2.5 mg/hr (08/19/15 0750)    Principal Problem:   Acute respiratory failure with hypercapnia (HCC) Active Problems:   Obstructive sleep apnea   Paranoid schizophrenia ([place])   DM (diabetes mellitus) ([place])   Chronic respiratory failure 02 dep with hypercarbia   Morbid obesity ([place])   Acute encephalopathy   COPD (chronic obstructive pulmonary disease) (HCC)   Obesity hypoventilation syndrome (HCC)   Aspiration pneumonia (HCC)   Lorazepam overdose    Time spent: critical care: 30 minutes   Jehanzeb Memon. MD  [facility] Pager [phone]. If 7PM-7AM, please contact night-coverage at [email or web address], password [facility] 08/19/2015, 7:56 AM  LOS: 3 days    By signing my name below, I, Rosalie Doctor, attest that this documentation has been prepared under the direction and in the presence of [facility] MD Electronically Signed: Rosalie Doctor, Scribe. 08/19/2015  8:38am  I, Dr. Kathie Dike, personally performed the services described in this documentaiton. All medical record entries made by the scribe were at my direction and in my presence. I have reviewed the chart and agree that the record reflects my personal performance and is accurate and complete  Kathie Dike, MD, 08/19/2015 8:41 AM

## 2015-08-19 NOTE — Progress Notes (Signed)
After patient self-extubaed, he was very combative and confused. Refused bath at the time, pulled IV out and attempted to pull foley catheter out. Dr. Roderic Palau at bedside calmed patient. Foley catheter removed by this RN. Patients wife in to visit updated on days events. Patient denies taking Xanax but unable to state where medication is at the time. Patient visibly upset regarding situation when prompted by wife stating "I didn't take them! I don't believe it!" Patient agreeing to a bath at this time and much calmer now that wife is present.

## 2015-08-19 NOTE — Care Management Important Message (Signed)
Important Message  Patient Details  Name: FILEMON GIMLIN MRN: [id number] Date of Birth: [birth year]   Medicare Important Message Given:  Yes    Sherald Barge, RN 08/19/2015, 2:03 PM

## 2015-08-19 NOTE — Care Management Note (Signed)
Case Management Note  Patient Details  Name: Rick Cruz MRN: [id number] Date of Birth: [birth year]  Subjective/Objective:                  Pt is from home with significant other. Pt cont's to be intubated.   Action/Plan: Will cont to follow and assess for DC plan once appropriate.   Expected Discharge Date:    08/21/2015              Expected Discharge Plan:  [place]  In-House Referral:  NA  Discharge planning Services  CM Consult  Post Acute Care Choice:    Choice offered to:     DME Arranged:    DME Agency:     HH Arranged:    HH Agency:     Status of Service:  In process, will continue to follow  Medicare Important Message Given:    Date Medicare IM Given:    Medicare IM give by:    Date Additional Medicare IM Given:    Additional Medicare Important Message give by:     If discussed at [place] of Stay Meetings, dates discussed:    Additional Comments:  Sherald Barge, RN 08/19/2015, 2:02 PM

## 2015-08-19 NOTE — Progress Notes (Signed)
Subjective: He remains intubated and on ventilator. He is  requiring both fentanyl and Versed drips. He was intubated after a drug overdose. At baseline he has COPD, sleep apnea, chronic pain and paranoid schizophrenia. He has what appears to be pneumonia on chest x-ray this is improving  Objective: Vital signs in last 24 hours: Temp:  [98.3 F (36.8 C)-98.9 F (37.2 C)] 98.7 F (37.1 C) (12/02 0400) Pulse Rate:  [60-91] 65 (12/02 0500) Resp:  [15-27] 16 (12/02 0500) BP: (110-164)/(56-142) 135/74 mmHg (12/02 0500) SpO2:  [89 %-97 %] 92 % (12/02 0500) FiO2 (%):  [40 %-50 %] 40 % (12/02 0411) Weight:  [140.6 kg (309 lb 15.5 oz)] 140.6 kg (309 lb 15.5 oz) (12/02 0500) Weight change: 1.1 kg (2 lb 6.8 oz) Last BM Date:  (unknown)  Intake/Output from previous day: 12/01 0701 - 12/02 0700 In: 885.2 [I.V.:685.2; IV Piggyback:200] Out: 550 [Urine:550]  PHYSICAL EXAM General appearance: He is intubated sedated on mechanical ventilation Resp: He still has bilateral rhonchi but he is clearer than yesterday Cardio: regular rate and rhythm, S1, S2 normal, no murmur, click, rub or gallop GI: soft, non-tender; bowel sounds normal; no masses,  no organomegaly Extremities: extremities normal, atraumatic, no cyanosis or edema  Lab Results:  Results for orders placed or performed during the hospital encounter of 08/16/15 (from the past 48 hour(s))  Glucose, capillary     Status: Abnormal   Collection Time: 08/17/15  7:26 AM  Result Value Ref Range   Glucose-Capillary 239 (H) 65 - 99 mg/dL   Comment 1 Notify RN    Comment 2 Document in Chart   Glucose, capillary     Status: Abnormal   Collection Time: 08/17/15 11:12 AM  Result Value Ref Range   Glucose-Capillary 251 (H) 65 - 99 mg/dL   Comment 1 Notify RN    Comment 2 Document in Chart   Glucose, capillary     Status: Abnormal   Collection Time: 08/17/15  4:09 PM  Result Value Ref Range   Glucose-Capillary 286 (H) 65 - 99 mg/dL   Comment  1 Notify RN    Comment 2 Document in Chart   Glucose, capillary     Status: Abnormal   Collection Time: 08/17/15  7:41 PM  Result Value Ref Range   Glucose-Capillary 259 (H) 65 - 99 mg/dL   Comment 1 Document in Chart   Glucose, capillary     Status: Abnormal   Collection Time: 08/18/15 12:21 AM  Result Value Ref Range   Glucose-Capillary 258 (H) 65 - 99 mg/dL   Comment 1 Notify RN   Blood gas, arterial     Status: Abnormal   Collection Time: 08/18/15  3:25 AM  Result Value Ref Range   FIO2 40.00    Delivery systems VENTILATOR    Mode PRESSURE REGULATED VOLUME CONTROL    VT 500 mL   LHR 16 resp/min   Peep/cpap 5.0 cm H20   pH, Arterial 7.384 7.350 - 7.450   pCO2 arterial 58.3 (HH) 35.0 - 45.0 mmHg    Comment: CRITICAL RESULT CALLED TO, READ BACK BY AND VERIFIED WITH:  SHERRI H RN  BY K KNICK RRT RCP ON 08/18/2015 AT 0335    pO2, Arterial 63.8 (L) 80.0 - 100.0 mmHg   Bicarbonate 31.5 (H) 20.0 - 24.0 mEq/L   TCO2 13.5 0 - 100 mmol/L   Acid-Base Excess 8.8 (H) 0.0 - 2.0 mmol/L   O2 Saturation 91.1 %   Patient temperature  37.0    Collection site RIGHT RADIAL    Drawn by [id number]    Sample type ARTERIAL    Allens test (pass/fail) PASS PASS  Glucose, capillary     Status: Abnormal   Collection Time: 08/18/15  3:43 AM  Result Value Ref Range   Glucose-Capillary 231 (H) 65 - 99 mg/dL   Comment 1 Notify RN   Basic metabolic panel     Status: Abnormal   Collection Time: 08/18/15  5:27 AM  Result Value Ref Range   Sodium 142 135 - 145 mmol/L   Potassium 4.8 3.5 - 5.1 mmol/L   Chloride 102 101 - 111 mmol/L   CO2 34 (H) 22 - 32 mmol/L   Glucose, Bld 237 (H) 65 - 99 mg/dL   BUN 12 6 - 20 mg/dL   Creatinine, Ser 0.89 0.61 - 1.24 mg/dL   Calcium 8.6 (L) 8.9 - 10.3 mg/dL   GFR calc non Af Amer >60 >60 mL/min   GFR calc Af Amer >60 >60 mL/min    Comment: (NOTE) The eGFR has been calculated using the CKD EPI equation. This calculation has not been validated in all clinical  situations. eGFR's persistently <60 mL/min signify possible Chronic Kidney Disease.    Anion gap 6 5 - 15  CBC     Status: Abnormal   Collection Time: 08/18/15  5:27 AM  Result Value Ref Range   WBC 10.8 (H) 4.0 - 10.5 K/uL   RBC 4.17 (L) 4.22 - 5.81 MIL/uL   Hemoglobin 11.4 (L) 13.0 - 17.0 g/dL   HCT 38.5 (L) 39.0 - 52.0 %   MCV 92.3 78.0 - 100.0 fL   MCH 27.3 26.0 - 34.0 pg   MCHC 29.6 (L) 30.0 - 36.0 g/dL   RDW 13.1 11.5 - 15.5 %   Platelets 186 150 - 400 K/uL  Glucose, capillary     Status: Abnormal   Collection Time: 08/18/15  7:22 AM  Result Value Ref Range   Glucose-Capillary 210 (H) 65 - 99 mg/dL   Comment 1 Notify RN    Comment 2 Document in Chart   Glucose, capillary     Status: Abnormal   Collection Time: 08/18/15 11:31 AM  Result Value Ref Range   Glucose-Capillary 183 (H) 65 - 99 mg/dL   Comment 1 Notify RN    Comment 2 Document in Chart   Glucose, capillary     Status: Abnormal   Collection Time: 08/18/15  4:41 PM  Result Value Ref Range   Glucose-Capillary 187 (H) 65 - 99 mg/dL   Comment 1 Notify RN   Glucose, capillary     Status: Abnormal   Collection Time: 08/18/15  7:47 PM  Result Value Ref Range   Glucose-Capillary 159 (H) 65 - 99 mg/dL   Comment 1 Notify RN    Comment 2 Document in Chart   Glucose, capillary     Status: Abnormal   Collection Time: 08/18/15 11:40 PM  Result Value Ref Range   Glucose-Capillary 157 (H) 65 - 99 mg/dL   Comment 1 Notify RN   Glucose, capillary     Status: Abnormal   Collection Time: 08/19/15  4:01 AM  Result Value Ref Range   Glucose-Capillary 142 (H) 65 - 99 mg/dL   Comment 1 Notify RN   CBC     Status: Abnormal   Collection Time: 08/19/15  4:58 AM  Result Value Ref Range   WBC 10.2 4.0 - 10.5 K/uL  RBC 3.99 (L) 4.22 - 5.81 MIL/uL   Hemoglobin 10.9 (L) 13.0 - 17.0 g/dL   HCT 36.6 (L) 39.0 - 52.0 %   MCV 91.7 78.0 - 100.0 fL   MCH 27.3 26.0 - 34.0 pg   MCHC 29.8 (L) 30.0 - 36.0 g/dL   RDW 13.1 11.5 - 15.5 %    Platelets 174 150 - 400 K/uL  Basic metabolic panel     Status: Abnormal   Collection Time: 08/19/15  4:58 AM  Result Value Ref Range   Sodium 143 135 - 145 mmol/L   Potassium 4.3 3.5 - 5.1 mmol/L   Chloride 104 101 - 111 mmol/L   CO2 34 (H) 22 - 32 mmol/L   Glucose, Bld 143 (H) 65 - 99 mg/dL   BUN 15 6 - 20 mg/dL   Creatinine, Ser 0.86 0.61 - 1.24 mg/dL   Calcium 8.7 (L) 8.9 - 10.3 mg/dL   GFR calc non Af Amer >60 >60 mL/min   GFR calc Af Amer >60 >60 mL/min    Comment: (NOTE) The eGFR has been calculated using the CKD EPI equation. This calculation has not been validated in all clinical situations. eGFR's persistently <60 mL/min signify possible Chronic Kidney Disease.    Anion gap 5 5 - 15    ABGS  Recent Labs  08/18/15 0325  PHART 7.384  PO2ART 63.8*  TCO2 13.5  HCO3 31.5*   CULTURES Recent Results (from the past 240 hour(s))  Blood culture (routine x 2)     Status: None (Preliminary result)   Collection Time: 08/16/15 12:08 PM  Result Value Ref Range Status   Specimen Description BLOOD LEFT ANTECUBITAL  Final   Special Requests BOTTLES DRAWN AEROBIC AND ANAEROBIC 4CC EACH  Final   Culture NO GROWTH 2 DAYS  Final   Report Status PENDING  Incomplete  Blood culture (routine x 2)     Status: None (Preliminary result)   Collection Time: 08/16/15 12:27 PM  Result Value Ref Range Status   Specimen Description BLOOD RIGHT HAND  Final   Special Requests BOTTLES DRAWN AEROBIC ONLY 4CC  Final   Culture NO GROWTH 2 DAYS  Final   Report Status PENDING  Incomplete  MRSA PCR Screening     Status: None   Collection Time: 08/16/15  4:00 PM  Result Value Ref Range Status   MRSA by PCR NEGATIVE NEGATIVE Final    Comment:        The GeneXpert MRSA Assay (FDA approved for NASAL specimens only), is one component of a comprehensive MRSA colonization surveillance program. It is not intended to diagnose MRSA infection nor to guide or monitor treatment for MRSA  infections.   Culture, respiratory (NON-Expectorated)     Status: None (Preliminary result)   Collection Time: 08/16/15  4:25 PM  Result Value Ref Range Status   Specimen Description TRACHEAL ASPIRATE  Final   Special Requests NONE  Final   Gram Stain   Final    MODERATE WBC PRESENT,BOTH PMN AND MONONUCLEAR RARE SQUAMOUS EPITHELIAL CELLS PRESENT FEW GRAM POSITIVE COCCI IN PAIRS IN CLUSTERS Performed at [facility]    Culture   Final    Culture reincubated for better growth Performed at [facility]    Report Status PENDING  Incomplete   Studies/Results: Dg Chest Port 1 View  08/18/2015  CLINICAL DATA:  Respiratory distress, chronic respiratory failure, acute encephalopathy, history of COPD, coronary artery disease, diabetes, and morbid obesity. EXAM: PORTABLE CHEST  1 VIEW COMPARISON:  Portable chest x-ray of August 16, 2015 FINDINGS: The lungs are well-expanded. The cardiac silhouette remains enlarged. The retrocardiac region on the left is dense with obscuration of the hemidiaphragm. The pulmonary vascularity is not clearly engorged. The endotracheal tube tip lies 3.8 cm above the carina. The esophagogastric tube tip projects below the inferior margin of the image. IMPRESSION: Improved aeration of the right lung. There is persistent left lower lobe atelectasis with new small left pleural effusion. The support tubes are in reasonable position. Electronically Signed   By: David  Martinique M.D.   On: 08/18/2015 08:08    Medications:  Prior to Admission:  Prescriptions prior to admission  Medication Sig Dispense Refill Last Dose  . aspirin 81 MG chewable tablet Chew 81 mg by mouth every morning.    08/15/2015 at Unknown time  . cholecalciferol (VITAMIN D) 1000 UNITS tablet Take 2,000 Units by mouth daily.   08/15/2015 at Unknown time  . Cinnamon 500 MG capsule Take 1,000 mg by mouth every morning.    08/15/2015 at Unknown time  . citalopram (CELEXA) 20 MG tablet Take 20  mg by mouth every morning.   08/15/2015 at Unknown time  . clopidogrel (PLAVIX) 75 MG tablet Take 75 mg by mouth every morning.   08/15/2015 at Unknown time  . furosemide (LASIX) 40 MG tablet Take 1 tablet (40 mg total) by mouth daily. 30 tablet 1 08/15/2015 at Unknown time  . gabapentin (NEURONTIN) 300 MG capsule Take 300 mg by mouth 4 (four) times daily.    08/15/2015 at Unknown time  . Insulin Human (INSULIN PUMP) SOLN Inject into the skin continuous. Humalog   08/16/2015 at Unknown time  . LORazepam (ATIVAN) 1 MG tablet Take 0.5 tablets (0.5 mg total) by mouth 2 (two) times daily as needed for anxiety. 20 tablet 0 08/15/2015 at Unknown time  . metoprolol (LOPRESSOR) 50 MG tablet Take 50 mg by mouth 2 (two) times daily.   08/15/2015 at Unknown time  . pantoprazole (PROTONIX) 40 MG tablet Take 40 mg by mouth 2 (two) times daily.   08/15/2015 at Unknown time  . simvastatin (ZOCOR) 40 MG tablet Take 40 mg by mouth every evening.   08/15/2015 at Unknown time  . sodium chloride 1 G tablet Take 1 g by mouth 2 (two) times daily.   08/15/2015 at Unknown time  . terazosin (HYTRIN) 1 MG capsule Take 1 mg by mouth 2 (two) times daily.    08/15/2015 at Unknown time  . tiotropium (SPIRIVA HANDIHALER) 18 MCG inhalation capsule Place 1 capsule (18 mcg total) into inhaler and inhale every morning. 30 capsule 12 08/15/2015 at Unknown time  . traZODone (DESYREL) 150 MG tablet Take 300 mg by mouth at bedtime.    08/15/2015 at Unknown time  . ziprasidone (GEODON) 80 MG capsule Take 80 mg by mouth every evening.   08/15/2015 at Unknown time  . albuterol (PROVENTIL HFA;VENTOLIN HFA) 108 (90 BASE) MCG/ACT inhaler Inhale 2 puffs into the lungs every 6 (six) hours as needed for wheezing or shortness of breath. 1 Inhaler 2 unknown  . albuterol (PROVENTIL) (2.5 MG/3ML) 0.083% nebulizer solution Take 3 mLs (2.5 mg total) by nebulization every 6 (six) hours as needed for wheezing or shortness of breath. 75 mL 0 unknown  .  HYDROcodone-acetaminophen (NORCO/VICODIN) 5-325 MG per tablet Take 1 tablet by mouth every 4 (four) hours as needed for moderate pain.    unknown  . Tetrahydrozoline HCl (VISINE OP) Apply 1  drop to eye daily as needed (irritation).    unknown  . triamcinolone cream (KENALOG) 0.1 % Apply 1 application topically 2 (two) times daily as needed (for irritation).    unknown   Scheduled: . albuterol  2.5 mg Nebulization Q4H  . ampicillin-sulbactam (UNASYN) IV  3 g Intravenous Q6H  . antiseptic oral rinse  7 mL Mouth Rinse QID  . aspirin  81 mg Per Tube q morning - 10a  . chlorhexidine gluconate  15 mL Mouth Rinse BID  . clopidogrel  75 mg Oral q morning - 10a  . enoxaparin (LOVENOX) injection  40 mg Subcutaneous Q24H  . guaiFENesin  1,200 mg Oral BID  . insulin aspart  0-20 Units Subcutaneous 6 times per day  . insulin glargine  25 Units Subcutaneous BID  . methylPREDNISolone (SOLU-MEDROL) injection  40 mg Intravenous Q12H  . metoprolol  50 mg Oral BID  . pantoprazole (PROTONIX) IV  40 mg Intravenous Daily  . simvastatin  40 mg Oral QPM  . sodium chloride  3 mL Intravenous Q12H  . terazosin  1 mg Oral BID  . tiotropium  18 mcg Inhalation q morning - 10a  . ziprasidone  80 mg Oral QPM   Continuous: . fentaNYL infusion INTRAVENOUS 350 mcg/hr (08/19/15 0313)  . midazolam (VERSED) infusion 5.5 mg/hr (08/19/15 0313)   VAP:OLIDCVUDTHYHO **OR** acetaminophen, albuterol, fentaNYL, fentaNYL (SUBLIMAZE) injection, midazolam  Assesment: He was admitted with acute hypercapnic hypoxic respiratory failure after drug overdose. He aspirated and has pneumonia from that which is clearing. He remains intubated and on mechanical ventilation. At baseline he has COPD and obstructive sleep apnea. He is improving and he is down now to 40% oxygen so we do have some opportunity for weaning today. The concern is he has a number of complicating medical problems including chronic pain and morbid obesity with obesity  hypoventilation and sleep apnea and paranoid schizophrenia Principal Problem:   Acute respiratory failure with hypercapnia ([place]) Active Problems:   Obstructive sleep apnea   Paranoid schizophrenia ([place])   DM (diabetes mellitus) ([place])   Chronic respiratory failure 02 dep with hypercarbia   Morbid obesity ([place])   Acute encephalopathy   COPD (chronic obstructive pulmonary disease) ([facility])   Obesity hypoventilation syndrome (H[facility])   Aspiration pneumonia ([facility])   Lorazepam overdose    Plan: Attempt weaning today.    LOS: 3 days   Nicosha Struve L 08/19/2015, 7:09 AM

## 2015-08-19 NOTE — Progress Notes (Signed)
Fentanyl 120cc and Versed 70cc wasted in sink with Johny Chess, RN

## 2015-08-19 NOTE — Progress Notes (Signed)
ANTIBIOTIC CONSULT NOTE - follow up  Pharmacy Consult for Unasyn Indication: pneumonia  Allergies  Allergen Reactions  . Phenergan [Promethazine Hcl] Other (See Comments)    Becomes very confused and aggressive  . Haldol [Haloperidol Lactate] Other (See Comments)    Shaking   . Metformin And Related Diarrhea    Patient Measurements: Height: 5\' 9"  (175.3 cm) Weight: (!) 309 lb 15.5 oz (140.6 kg) IBW/kg (Calculated) : 70.7kg  Vital Signs: Temp: 99.3 F (37.4 C) (12/02 0736) Temp Source: Axillary (12/02 0736) BP: 136/66 mmHg (12/02 0815) Pulse Rate: 63 (12/02 0815) Intake/Output from previous day: 12/01 0701 - 12/02 0700 In: 885.2 [I.V.:685.2; IV Piggyback:200] Out: 550 [Urine:550] Intake/Output from this shift:    Labs:  Recent Labs  08/17/15 0445 08/18/15 0527 08/19/15 0458  WBC 14.7* 10.8* 10.2  HGB 12.0* 11.4* 10.9*  PLT 217 186 174  CREATININE 1.03 0.89 0.86   Estimated Creatinine Clearance: 114.8 mL/min (by C-G formula based on Cr of 0.86). No results for input(s): VANCOTROUGH, VANCOPEAK, VANCORANDOM, GENTTROUGH, GENTPEAK, GENTRANDOM, TOBRATROUGH, TOBRAPEAK, TOBRARND, AMIKACINPEAK, AMIKACINTROU, AMIKACIN in the last 72 hours.   Microbiology: Recent Results (from the past 720 hour(s))  Blood culture (routine x 2)     Status: None (Preliminary result)   Collection Time: 08/16/15 12:08 PM  Result Value Ref Range Status   Specimen Description BLOOD LEFT ANTECUBITAL  Final   Special Requests BOTTLES DRAWN AEROBIC AND ANAEROBIC 4CC EACH  Final   Culture NO GROWTH 2 DAYS  Final   Report Status PENDING  Incomplete  Blood culture (routine x 2)     Status: None (Preliminary result)   Collection Time: 08/16/15 12:27 PM  Result Value Ref Range Status   Specimen Description BLOOD RIGHT HAND  Final   Special Requests BOTTLES DRAWN AEROBIC ONLY 4CC  Final   Culture NO GROWTH 2 DAYS  Final   Report Status PENDING  Incomplete  MRSA PCR Screening     Status: None    Collection Time: 08/16/15  4:00 PM  Result Value Ref Range Status   MRSA by PCR NEGATIVE NEGATIVE Final    Comment:        The GeneXpert MRSA Assay (FDA approved for NASAL specimens only), is one component of a comprehensive MRSA colonization surveillance program. It is not intended to diagnose MRSA infection nor to guide or monitor treatment for MRSA infections.   Culture, respiratory (NON-Expectorated)     Status: None (Preliminary result)   Collection Time: 08/16/15  4:25 PM  Result Value Ref Range Status   Specimen Description TRACHEAL ASPIRATE  Final   Special Requests NONE  Final   Gram Stain   Final    MODERATE WBC PRESENT,BOTH PMN AND MONONUCLEAR RARE SQUAMOUS EPITHELIAL CELLS PRESENT FEW GRAM POSITIVE COCCI IN PAIRS IN CLUSTERS Performed at [facility]    Culture   Final    Culture reincubated for better growth Performed at [facility]    Report Status PENDING  Incomplete    Medical History: Past Medical History  Diagnosis Date  . COPD (chronic obstructive pulmonary disease) ([place])   . CHF (congestive heart failure) ([facility])     diastolic   . Hypercholesterolemia   . Depression   . Schizophrenia ([place])   . Sleep apnea     noncompliant with BiPAP  . Tobacco abuse   . On home O2     3 liters  . Chronic pain   . Diabetes mellitus without complication ([place])  insulin pump  . 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   Anti-infectives    Start     Dose/Rate Route Frequency Ordered Stop   08/17/15 1800  Ampicillin-Sulbactam (UNASYN) 3 g in sodium chloride 0.9 % 100 mL IVPB     3 g 100 mL/hr over 60 Minutes Intravenous Every 6 hours 08/17/15 1448     08/17/15 1000  Ampicillin-Sulbactam (UNASYN) 3 g in sodium chloride 0.9 % 100 mL IVPB  Status:  Discontinued     3 g 100 mL/hr over 60 Minutes Intravenous Every 6 hours 08/17/15 0922 08/17/15 1448   08/16/15 2000  piperacillin-tazobactam (ZOSYN) IVPB 3.375 g  Status:  Discontinued     3.375  g 12.5 mL/hr over 240 Minutes Intravenous Every 8 hours 08/16/15 1549 08/17/15 0907   08/16/15 1200  piperacillin-tazobactam (ZOSYN) IVPB 3.375 g     3.375 g 100 mL/hr over 30 Minutes Intravenous  Once 08/16/15 1153 08/16/15 1302     Assessment: 69 yo obese male presented to ED with lethargy and had been given a prescription the day before from his PCP for Ativan. The wife found him lethargic and diaphoretic and an empty Ativan bottle. Presumed to be an overdose on benzodiazepines.  He has been hypoxic and a chest x-ray showed RLL pneumonia presumably from aspiration. Unasyn for anaerobe coverage. MRSA PCR is negative. Resp cx few GPC.  Afebrile, renal stable.  Goal of Therapy:  resolution of infection  Plan:  Continue Unasyn 3gm IV q6hr Follow up culture results monitor v/s and labs   Hart Robinsons, [place] D Clinical Pharmacist 08/19/2015,8:33 AM

## 2015-08-19 NOTE — Procedures (Signed)
Extubation Procedure Note  Patient Details:   Name: Rick Cruz DOB: Sep 28, 1945 MRN: [id number]   Airway Documentation:  Airway (Active)  Secured at (cm) 25 cm 08/19/2015 10:47 AM  Measured From Lips 08/19/2015 10:47 AM  Secured Location Left 08/19/2015 10:47 AM  Secured By [employer] 08/19/2015 10:47 AM  Tube Holder Repositioned Yes 08/19/2015 10:47 AM  Cuff Pressure (cm H2O) 26 cm H2O 08/18/2015  7:42 PM  Site Condition Dry 08/19/2015 10:47 AM   Patient self extubated at 1350. He was placed on 4L [place] and is able to maintain SpO2 of 88-92% Evaluation  O2 sats: currently acceptable Complications: No apparent complications Patient did tolerate procedure well. Bilateral Breath Sounds: Diminished Patient is able to speak.  Pete Pelt 08/19/2015, 2:03 PM

## 2015-08-20 ENCOUNTER — Inpatient Hospital Stay (HOSPITAL_COMMUNITY): Payer: Medicare Other

## 2015-08-20 DIAGNOSIS — J9611 Chronic respiratory failure with hypoxia: Secondary | ICD-10-CM

## 2015-08-20 LAB — CBC
HEMATOCRIT: 38.2 % — AB (ref 39.0–52.0)
HEMOGLOBIN: 11.2 g/dL — AB (ref 13.0–17.0)
MCH: 27.2 pg (ref 26.0–34.0)
MCHC: 29.3 g/dL — ABNORMAL LOW (ref 30.0–36.0)
MCV: 92.7 fL (ref 78.0–100.0)
Platelets: 200 10*3/uL (ref 150–400)
RBC: 4.12 MIL/uL — ABNORMAL LOW (ref 4.22–5.81)
RDW: 13.2 % (ref 11.5–15.5)
WBC: 12 10*3/uL — AB (ref 4.0–10.5)

## 2015-08-20 LAB — GLUCOSE, CAPILLARY
GLUCOSE-CAPILLARY: 246 mg/dL — AB (ref 65–99)
GLUCOSE-CAPILLARY: 222 mg/dL — AB (ref 65–99)
Glucose-Capillary: 174 mg/dL — ABNORMAL HIGH (ref 65–99)
Glucose-Capillary: 117 mg/dL — ABNORMAL HIGH (ref 65–99)

## 2015-08-20 LAB — BASIC METABOLIC PANEL
ANION GAP: 5 (ref 5–15)
BUN: 19 mg/dL (ref 6–20)
CHLORIDE: 102 mmol/L (ref 101–111)
CO2: 34 mmol/L — ABNORMAL HIGH (ref 22–32)
Calcium: 8.6 mg/dL — ABNORMAL LOW (ref 8.9–10.3)
Creatinine, Ser: 0.82 mg/dL (ref 0.61–1.24)
GFR calc Af Amer: >60 mL/min (ref >60)
GLUCOSE: 199 mg/dL — AB (ref 65–99)
POTASSIUM: 4.7 mmol/L (ref 3.5–5.1)
Sodium: 141 mmol/L (ref 135–145)

## 2015-08-20 MED ORDER — ALBUTEROL SULFATE (2.5 MG/3ML) 0.083% IN NEBU
2.5000 mg | INHALATION_SOLUTION | Freq: Four times a day (QID) | RESPIRATORY_TRACT | Status: DC
  Administered 2015-08-21 (×2): 2.5 mg via RESPIRATORY_TRACT
  Filled 2015-08-20 (×2): qty 3

## 2015-08-20 MED ORDER — PREDNISONE 20 MG PO TABS
40.0000 mg | ORAL_TABLET | Freq: Every day | ORAL | Status: DC
  Administered 2015-08-21: 40 mg via ORAL
  Filled 2015-08-20: qty 2

## 2015-08-20 MED ORDER — AMOXICILLIN-POT CLAVULANATE 875-125 MG PO TABS
1.0000 | ORAL_TABLET | Freq: Two times a day (BID) | ORAL | Status: DC
  Administered 2015-08-20 – 2015-08-21 (×3): 1 via ORAL
  Filled 2015-08-20 (×7): qty 1

## 2015-08-20 NOTE — Progress Notes (Addendum)
[facility] PROGRESS NOTE  Rick Cruz [id number] DOB: 1946-06-14 DOA: 08/16/2015 PCP: [name], MD  Assessment/Plan: 1. Ativan overdose, intention unknown with resultant resp failure, encephalopathy and aspiration pneumonia. Lactic acid normal, no evidence of sepsis. Dr. Sarajane Jews d/w poison control who recommends supportive care. Given he is now stable, will consult psychiatry.  2. VDRF, Acute hypercapnic resp failure, resp acidosis secondary to Ativan overdose, complicated by oxygen-dependent COPD, OSA non-compliant with BiPAP. Patient self-extubated 12/2. He remains stable on 3L [place], uses 3L at baseline. Pulmonology input appreciated. Will monitor closely. 3. Suspected RLL/lobar aspiration pneumonia. He is currently afebrile. WBC of 12.0. Lactic acid WNL. Continue steroids and albuterol   per pulmonology. Given improvement, will transition to oral abx today. MRSA PCR was negative. 4. Acute encephalopathy, secondary to above. Appears resolved. 5. COPD, chronic hypoxic resp failure. Patient is on bronchodilators. Will start steroid taper.  6. DM on insulin pump, removed. Placed on Lantus and SSI. CBG stable, will continue current treatment.  7. Depression, PMH addiction, alcohol, pain meds. Alcohol remote. No recent BZD use until 11/28 8. Chronic pain on hydrocode/APAP. Acetaminophen level normal. 9. Essential HTN, continue Metoprolol.  10. Schizophrenia, continue Geodon.   11. HLD, continue statin.  12. OSA, non-compliant. 13. Anxiety/agitation. Currently on low dose ativan   Code Status: Full  DVT prophylaxis: Lovenox Family Communication:  Discussed with patient who understands and has no concerns at this time. Disposition Plan: Move to medical floor.   Consultants:  Pulmonology  Procedures:  ETT 11/29>>12/2  Antibiotics:  Zosyn 11/29>>11/30  Unasyn 11/30>>  HPI/Subjective: Feels okay, denies any pain or SOB. States he is on 3L of O2 at home.    Objective: Filed Vitals:   08/20/15 0500 08/20/15 0600  BP: 148/72 147/80  Pulse: 55 68  Temp:    Resp: 14 8    Intake/Output Summary (Last 24 hours) at 08/20/15 0742 Last data filed at 08/20/15 0600  Gross per 24 hour  Intake 1068.46 ml  Output   1650 ml  Net -581.54 ml   Filed Weights   08/18/15 0500 08/19/15 0500 08/20/15 0528  Weight: 139.5 kg (307 lb 8.7 oz) 140.6 kg (309 lb 15.5 oz) 138.1 kg (304 lb 7.3 oz)    Exam:   General: NAD, appears comfortable  Cardiovascular: Mild tachycardia, regular rhythm  Respiratory: clear bilaterally, No wheezing, rales or rhonchi  Abdomen: soft, non tender, no distention , bowel sounds normal  Musculoskeletal: No edema b/l  Data Reviewed: Basic Metabolic Panel:  Recent Labs Lab 08/16/15 1006 08/17/15 0445 08/18/15 0527 08/19/15 0458 08/20/15 0452  NA 136 138 142 143 141  K 5.0 4.3 4.8 4.3 4.7  CL 90* 96* 102 104 102  CO2 37* 33* 34* 34* 34*  GLUCOSE 289* 263* 237* 143* 199*  BUN 9 11 12 15 19   CREATININE 0.89 1.03 0.89 0.86 0.82  CALCIUM 8.9 8.6* 8.6* 8.7* 8.6*   Liver Function Tests:  Recent Labs Lab 08/16/15 1006 08/17/15 0445  AST 10* 12*  ALT 11* 10*  ALKPHOS 70 70  BILITOT 0.7 0.7  PROT 7.2 6.6  ALBUMIN 3.9 3.5   CBC:  Recent Labs Lab 08/16/15 1006 08/17/15 0445 08/18/15 0527 08/19/15 0458 08/20/15 0452  WBC 11.8* 14.7* 10.8* 10.2 12.0*  NEUTROABS 9.7*  --   --   --   --   HGB 12.8* 12.0* 11.4* 10.9* 11.2*  HCT 42.5 40.4 38.5* 36.6* 38.2*  MCV 91.0 91.8 92.3 91.7 92.7  PLT  188 217 186 174 200   CBG:  Recent Labs Lab 08/19/15 0709 08/19/15 1109 08/19/15 1636 08/19/15 2058 08/20/15 0727  GLUCAP 122* 132* 180* 156* 174*    Recent Results (from the past 240 hour(s))  Blood culture (routine x 2)     Status: None (Preliminary result)   Collection Time: 08/16/15 12:08 PM  Result Value Ref Range Status   Specimen Description BLOOD LEFT ANTECUBITAL  Final   Special Requests  BOTTLES DRAWN AEROBIC AND ANAEROBIC 4CC EACH  Final   Culture NO GROWTH 2 DAYS  Final   Report Status PENDING  Incomplete  Blood culture (routine x 2)     Status: None (Preliminary result)   Collection Time: 08/16/15 12:27 PM  Result Value Ref Range Status   Specimen Description BLOOD RIGHT HAND  Final   Special Requests BOTTLES DRAWN AEROBIC ONLY 4CC  Final   Culture NO GROWTH 2 DAYS  Final   Report Status PENDING  Incomplete  MRSA PCR Screening     Status: None   Collection Time: 08/16/15  4:00 PM  Result Value Ref Range Status   MRSA by PCR NEGATIVE NEGATIVE Final    Comment:        The GeneXpert MRSA Assay (FDA approved for NASAL specimens only), is one component of a comprehensive MRSA colonization surveillance program. It is not intended to diagnose MRSA infection nor to guide or monitor treatment for MRSA infections.   Culture, respiratory (NON-Expectorated)     Status: None   Collection Time: 08/16/15  4:25 PM  Result Value Ref Range Status   Specimen Description TRACHEAL ASPIRATE  Final   Special Requests NONE  Final   Gram Stain   Final    MODERATE WBC PRESENT,BOTH PMN AND MONONUCLEAR RARE SQUAMOUS EPITHELIAL CELLS PRESENT FEW GRAM POSITIVE COCCI IN PAIRS IN CLUSTERS Performed at [facility]    Culture   Final    NORMAL OROPHARYNGEAL FLORA Performed at [facility]    Report Status 08/19/2015 FINAL  Final     Studies: Dg Chest Port 1 View  08/19/2015  CLINICAL DATA:  Acute and chronic respiratory failure with oxygen dependency, acute encephalopathy, history of COPD and aspiration pneumonia EXAM: PORTABLE CHEST 1 VIEW COMPARISON:  Portable chest x-ray of August 18, 2015 FINDINGS: The lungs are adequately inflated. The interstitial markings are coarse. The retrocardiac region on the left remains dense. The cardiac silhouette remains enlarged. The pulmonary vascularity is not clearly engorged. The endotracheal tube tip lies 4 cm above the carina.  The esophagogastric tube tip projects below the inferior margin of the image. IMPRESSION: Persistent left lower lobe atelectasis and possible small pleural effusion. Stable cardiomegaly. The support tubes are in reasonable position. Electronically Signed   By: David  Martinique M.D.   On: 08/19/2015 07:44    Scheduled Meds: . albuterol  2.5 mg Nebulization Q4H  . ampicillin-sulbactam (UNASYN) IV  3 g Intravenous Q6H  . aspirin  81 mg Per Tube q morning - 10a  . clopidogrel  75 mg Oral q morning - 10a  . enoxaparin (LOVENOX) injection  40 mg Subcutaneous Q24H  . guaiFENesin  1,200 mg Oral BID  . insulin aspart  0-20 Units Subcutaneous TID WC & HS  . insulin glargine  25 Units Subcutaneous BID  . LORazepam  0.5 mg Oral BID  . methylPREDNISolone (SOLU-MEDROL) injection  40 mg Intravenous Q12H  . metoprolol  50 mg Oral BID  . pantoprazole (PROTONIX) IV  40  mg Intravenous Daily  . simvastatin  40 mg Oral QPM  . sodium chloride  3 mL Intravenous Q12H  . terazosin  1 mg Oral BID  . tiotropium  18 mcg Inhalation q morning - 10a  . traZODone  300 mg Oral QHS  . ziprasidone  80 mg Oral QPM   Continuous Infusions:    Principal Problem:   Acute respiratory failure with hypercapnia (HCC) Active Problems:   Obstructive sleep apnea   Paranoid schizophrenia ([place])   DM (diabetes mellitus) ([place])   Chronic respiratory failure 02 dep with hypercarbia   Morbid obesity ([place])   Acute encephalopathy   COPD (chronic obstructive pulmonary disease) (HCC)   Obesity hypoventilation syndrome (HCC)   Aspiration pneumonia (HCC)   Lorazepam overdose    Time spent: 30 minutes   Smt. Loder. MD  [facility] Pager [phone]. If 7PM-7AM, please contact night-coverage at [email or web address], password [facility] 08/20/2015, 7:42 AM  LOS: 4 days    By signing my name below, I, Rosalie Doctor, attest that this documentation has been prepared under the direction and in the presence of [facility].  MD Electronically Signed: Rosalie Doctor, Scribe. 08/20/2015  9:04am   I, Dr. Kathie Dike, personally performed the services described in this documentaiton. All medical record entries made by the scribe were at my direction and in my presence. I have reviewed the chart and agree that the record reflects my personal performance and is accurate and complete  Kathie Dike, MD, 08/20/2015 9:20 AM

## 2015-08-20 NOTE — Consult Note (Signed)
Telepsych Consultation   Reason for Consult:  Overdose of Ativan (Accidental) Referring Physician:  Dr Roderic Palau Patient Identification: Rick Cruz MRN:  [id number] Principal Diagnosis: Acute respiratory failure with hypercapnia [facility]) Diagnosis:   Patient Active Problem List   Diagnosis Date Noted  . Aspiration pneumonia ([place]) [J69.0] 08/16/2015  . Lorazepam overdose [T42.4X1A] 08/16/2015  . Obesity hypoventilation syndrome ([place]) [E66.2] 04/05/2015  . CSA (central sleep apnea) [G47.31] 04/05/2015  . Non compliance with medical treatment [Z91.19] 04/05/2015  . Acute respiratory failure with hypoxia and hypercarbia (HCC) [J96.01, J96.02]   . Acute on chronic diastolic CHF (congestive heart failure) ([place]) [I50.33] 08/06/2014  . Encephalopathy, metabolic [T02.40] 97/35/3299  . COPD with acute exacerbation ([place]) [J44.1] 08/04/2014  . COPD (chronic obstructive pulmonary disease) ([place]) [J44.9] 07/14/2014  . Recurrent falls [R29.6] 05/08/2014  . Ankle fracture, right [S82.891A] 05/08/2014  . Ankle fracture, left [S82.892A] 05/08/2014  . Acute respiratory failure with hypercapnia ([place]) [J96.02] 05/08/2014  . C. difficile colitis [A04.7] 01/04/2014  . Delirium [R41.0] 01/02/2014  . Acute encephalopathy [G93.40] 01/02/2014  . Morbid obesity ([place]) [E66.01] 06/17/2013  . Erosive esophagitis [K20.8] 04/22/2013  . Hyperkalemia [E87.5] 04/22/2013  . Chronic respiratory failure 02 dep with hypercarbia [J96.10] 04/22/2013  . DM (diabetes mellitus) ([place]) [E11.9] 04/21/2013  . COPD exacerbation ([place]) [J44.1] 04/18/2013  . Paranoid schizophrenia ([place]) [F20.0] 04/18/2013  . CAD (coronary artery disease) [I25.10] 04/18/2013  . Dyspnea [R06.00] 08/26/2008  . DYSLIPIDEMIA [E78.5] 08/25/2008  . Obstructive sleep apnea [G47.33] 08/25/2008  . Essential hypertension [I10] 08/25/2008    Total Time spent with patient: 15 minutes  Subjective:   Rick Cruz is a 69 y.o. male patient admitted 08/16/2015  thru [name] ED to ICU  Obtunded/ requiring intubation from apparently consuming his prescription of Ativan #60 0.5 mg.(See ED provider note for details)t has a long history of self medicating with extra doses of opiates and benzodiazepenes to treat his COPD dyspnea and its associated anxiety. He apparently becomes confused at times when overdosing himself and on this occasion he says "I took too much medicine"His wife confirmed this history on admission. He denies SI and depression. He says he talk freely with his wife and son and does desire outpatient therapy for mental health.  HPI: AS above  Past Medical History:  Past Medical History  Diagnosis Date  . COPD (chronic obstructive pulmonary disease) ([place])   . CHF (congestive heart failure) (HCC)     diastolic   . Hypercholesterolemia   . Depression   . Schizophrenia ([place])   . Sleep apnea     noncompliant with BiPAP  . Tobacco abuse   . On home O2     3 liters  . Chronic pain   . Diabetes mellitus without complication (HCC)     insulin pump  . 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 Knee surgery      X2  . 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   . Colonoscopy  July 2011    Dr. Benson Norway: multiple polyps, internal and external hemorrhoids, diverticulosis. tubular adenoma  . Esophagogastroduodenoscopy (egd) with propofol N/A 04/20/2013    Procedure: ESOPHAGOGASTRODUODENOSCOPY (EGD) WITH PROPOFOL;  Surgeon: Daneil Dolin, MD;  Location: [facility];  Service: Endoscopy;  Laterality: N/A;   Family History:  Family History  Problem Relation  Age of Onset  . Colon cancer Neg Hx   . Parkinson's disease    . Thyroid disease Mother   . Parkinson's disease Father    Social History:  History  Alcohol Use No    Comment: history of ETOH abuse in remote past, none in at least 10 years     History   Drug Use No    Social History   Social History  . Marital Status: Married    Spouse Name: N/A  . Number of Children: N/A  . Years of Education: N/A   Social History Main Topics  . Smoking status: Former Smoker -- 1.00 packs/day for 50 years    Types: Cigarettes    Quit date: 09/16/2013  . Smokeless tobacco: Never Used  . Alcohol Use: No     Comment: history of ETOH abuse in remote past, none in at least 10 years  . Drug Use: No  . Sexual Activity: Not Asked   Other Topics Concern  . None   Social History Narrative   Drinks about 4 cups of caffeine daily. 2 cups of coffee and 2 coke zeros.   Additional History:  Expand All Collapse All   NAME: Rick Cruz, Rick Cruz                ACCOUNT NO.:  [id number]  MEDICAL RECORD NO.:  [id number]          PATIENT TYPE:  OUT  LOCATION:  SLEEP LAB                     FACILITY:  [facility]  PHYSICIAN:  Kofi A. Merlene Laughter, M.D. DATE OF BIRTH:  1946-09-05  DATE OF STUDY:  11/13/2011                           NOCTURNAL POLYSOMNOGRAM  REFERRING PHYSICIAN:  Arlyss Repress, MD  INDICATIONS:  A 69 year old man who has a known diagnosis of obstructive sleep apnea syndrome.  From a sleep study done in 2005, he presents with recurrent symptoms of fatigue and snoring.  MEDICATIONS:  Abilify, Lasix, simvastatin, aspirin, quinapril, glipizide, metformin, potassium, trazodone, Vicodin, metoprolol, fish oil, multivitamin, insulin, Combivent.  Epworth Sleepiness Scale is 6.  BMI 38.  ARCHITECTURAL SUMMARY:  This is a full night CPAP for positive pressure titration recording.  The total recording time of 428 minutes.  Sleep efficiency 77%.  Sleep latency 21 minutes.  REM latency 135.5 minutes. Stage N1 8.2%, N2 58%, N3 1%, and REM sleep 33%.  Risk factors; baseline oxygen saturation was low at 84, lowest saturation went down to 75. Even while patient was on 4 L of oxygen during the REM sleep.  The patient was placed on positive pressure starting at 6 and  various different maneuvers were tried.  Unfortunately, the patient's low baseline persisted.  He was titrated up to a CPAP of 10 with oxygen gradually added up to 4. He was also started on bilevel pressures between 12/6 to 21/14; again, however, he still had significant low baseline, still requiring nocturnal oxygen.  The desaturations were worse during the REM sleep.  Patient could do well with various pressures as long as he is on 4 L of oxygen.  LIMB MOVEMENT SUMMARY:  PLM index 0.  ELECTROCARDIOGRAM SUMMARY:  Average heart rate is 91, with no significant dysrhythmias observed.  IMPRESSION: 1. Obstructive sleep apnea syndrome, which responds well to various     positive pressure  treatment.  Did a little better on bilevel     pressures, but requiring nocturnal oxygen. 2. Hypoventilation syndrome.  RECOMMENDATIONS:  I would recommend a bilevel of 13/7 with 4 L of oxygen bled in.  I also suggest that the patient undergo a nocturnal oximetry about a month after he has been on treatment, and this oximetry should also be done while he is on treatment.   Kofi A. Merlene Laughter, M.D.    KAD/MEDQ  D:  11/19/2011 08:45:54  T:  11/19/2011 09:12:36  Job:  [id number]        Therapy Evaluation Patient Details Name: Rick Cruz MRN: [id number] DOB: [birth year] Today's Date: 12/06/2014     History of Present Illness    Pt is a 69 year old male with a history of COPD, CHF, depression, schizophrenia, chronic neck and back pain and DM.  He has been falling frequently at home (he admits probably due to overuse of pain meds) and now has a fx of the right medial malleolus and distal fibula.  No surgery is planned.  Pt is extremely sedantary at home and is normally only able to ambulate 25' with a walker.  He spends his day in a lift chair.  His is on 3 L O2 chronically.   Clinical Impression    Pt was alert and cooperative for evaluation.  CAM walker was obtained and I fitted it to pt's right foot.   He has used them before, so is comfortable with its' use.  He has no significant pain in the left ankle.  Bed mobility is strenuous but he is able to get to EOB without assist.  He did need mod assist to transfer bed to [facility] but was able to stand to a walker and ambulate 6' with WBAT right and close guarding. I have recommended SNF at d/c but pt refuses   and feels that he will be able to manage at home.  He would benefit from having a BSC and w/c initially at home along with HHPT.     Follow Up Recommendations  Home health PTor SNF      Equipment Recommendations    3in1 (PT);Wheelchair (measurements PT)     Recommendations for Other Services       none    Precautions / Restrictions  Precautions  Precautions: Fall Required Braces or Orthoses: Other Brace/Splint Other Brace/Splint: CAM walker on when out of bed Restrictions Weight Bearing Restrictions: No Other Position/Activity Restrictions: pt is WBAT right per MD order        Mobility    Bed Mobility  Overal bed mobility: Needs Assistance Bed Mobility: Supine to Sit     Supine to sit: Supervision        Transfers Overall transfer level: Needs assistance Equipment used: Rolling walker (2 wheeled) Transfers: Sit to/from [employer] Sit to Stand: Min assist   Squat pivot transfers: Mod assist        Ambulation/Gait Ambulation/Gait assistance: Min guard Ambulation Distance (Feet): 6 Feet Assistive device: Rolling walker (2 wheeled) Gait Pattern/deviations: Decreased step length - right Gait velocity: appropriate pace Gait velocity interpretation: Below normal speed for age/gender General Gait Details: stability with walker was fair to good  Stairs            Wheelchair Mobility    Modified Rankin (Stroke Patients Only)          Balance  Overall balance assessment: Modified Independent  Pertinent Vitals/Pain  Pain  Assessment: No/denies pain       Home Living  Family/patient expects to be discharged to:: Private residence  Living Arrangements: Spouse/significant other;Children Available Help at Discharge: Family;Available 24 hours/day Type of Home: House Home Access: Ramped entrance     Home Layout: One level Home Equipment: Walker - 2 wheels;Cane - quad;Cane - single point;Shower seat Additional Comments: pt has a lift chair at home     Prior Function  Level of Independence: Needs assistance   Gait / Transfers Assistance Needed: ambulates independently with a walker, sleeps in a lift chair  ADL's / Homemaking Assistance Needed: wife assists with a sponge bath           Hand Dominance           Extremity/Trunk Assessment     Upper Extremity Assessment: Generalized weakness            Lower Extremity Assessment: Generalized weakness          Communication     Communication: No difficulties   Cognition  Arousal/Alertness: Awake/alert  Behavior During Therapy: WFL for tasks assessed/performed Overall Cognitive Status: Within Functional Limits for tasks assessed                       General Comments        Exercises           Assessment/Plan      PT Assessment  Patient needs continued PT services   PT Diagnosis  Difficulty walking;Generalized weakness   PT Problem List  Decreased strength;Decreased activity tolerance;Decreased mobility;Cardiopulmonary status limiting activity;Obesity;Pain   PT Treatment Interventions  Gait training;Functional mobility training    PT Goals (Current goals can be found in the Care Plan section)  Acute Rehab PT Goals  Patient Stated Goal: none stated PT Goal Formulation: With patient Time For Goal Achievement: 12/13/14 Potential to Achieve Goals: Good     Frequency  Min 5X/week     Barriers to discharge    none      Co-evaluation                 End of Session  Equipment Utilized During Treatment: Gait  belt;Oxygen  Activity Tolerance: Patient tolerated treatment well Patient left: in chair;with call bell/phone within reach;with chair alarm set            Additional Social History:   Author: Ihor Gully, LCSW Service: (none) Author Type: Social Worker     Filed: 12/07/2014 11:00 AM Note Time: 12/07/2014 10:59 AM Status: Signed    Editor: Ihor Gully, LCSW (Social Worker)      Expand All Collapse All   Clinical Social Work Department BRIEF PSYCHOSOCIAL ASSESSMENT 12/07/2014  Patient:  Rick Cruz, Rick Cruz     Account Number:  [id number]     Admit date:  12/05/2014  Clinical Social Worker:  Legrand Como  Date/Time:  12/07/2014 10:37 AM  Referred by:  CSW  Date Referred:  12/07/2014 Referred for   SNF Placement    Other Referral:   Interview type:  Patient Other interview type:    PSYCHOSOCIAL DATA Living Status:  WIFE Admitted from facility:   Level of care:   Primary support name:  Yitzchok Carriger Primary support relationship to patient:  SPOUSE Degree of support available:   Patient reports that his wife is support.     CURRENT CONCERNS Current Concerns   Post-Acute Placement    Other Concerns:  SOCIAL WORK ASSESSMENT / PLAN CSW met with patient who was alert and oriented.  Patient stated that she lives in the home with his wife.  He stated that at baseline she ambulates with a walker and does not require assistance with his ADLs.  CSW discussed the SNF recommendation with patient.  CSW explored patient's feelings related to the placement.  Patient stated "I really don't want to go" referring to SNF placement.  He indicated that he would go.  CSW encouraged patient to realize that his rehab will be short term and that he is going home stronger and in a better condition upon completion of rehab.  Patient was agreeable.  CSW provided patient a SNF list.  Patient indicated that he had been to a SNF last year after breaking both of his ankles when he fell  backwards.  He indicated that he last year he went to [facility] in [place]. He stated that he did not desire to go back to that facility.  Patient indicated that he desired to go to [facility], [facility] or [facility]. Patient gave CSW permission to send clinicals to [facility], [facility] and [facility].    Assessment/plan status:  Information/Referral to [employer] Other assessment/ plan:   Information/referral to community resources:    PATIENT'S/FAMILY'S RESPONSE TO PLAN OF CARE: While patient would prefer to go home, he is agreeable to go to SNF.         CARE MANAGEMENT NOTE 12/07/2014    Patient:  Rick Cruz, Rick Cruz   Account Number:  [id number]    Date Initiated:  12/06/2014  Documentation initiated by:  Theophilus Kinds    Subjective/Objective Assessment:   Pt admitted from home with fractured ankle. Pt lives with his wife and son and will return home at discharge. Pt has ramp, home O2 with [employer], walker and cane.       Action/Plan:    Pt chooses [facility] for [facility] PT and wheelchair and BSC. Referrals called to Romualdo Bolk of [facility] and [place] with [facility]. Wheelchair and BSC will be delivered to pts home. [facility] services to start within 48 hours of discharge. Pt and pts RN aware.     Anticipated DC Date:  12/07/2014   Anticipated DC Plan:  [place] referral   Clinical Social Worker        DC Planning Services   CM consult        Choice offered to / List presented to:  C-1 Patient              Status of service:  Completed, signed off  Medicare Important Message given?   (If response is "NO", the following Medicare IM given date fields will be blank) Date Medicare IM given:   Medicare IM given by:    Date Additional Medicare IM given:   Additional Medicare IM given by:     Discharge Disposition:  [place]    Per UR Regulation:     If discussed at Long Length of Stay Meetings, dates discussed:      Comments:   12/07/2014 [place], RN, MSN, CM Pt now wishes to go to SNF. CSW is aware and has arranged for placement at [facility]. Pt and RN aware. Pt discharging today. [facility] has been notified of change in discharge plan. No further CM needs. 12/06/14 [place], RN BSN CM   Allergies:   Allergies  Allergen Reactions  .  Phenergan [Promethazine Hcl] Other (See Comments)    Becomes very confused and aggressive  . Haldol [Haloperidol Lactate] Other (See Comments)    Shaking   . Metformin And Related Diarrhea    Labs:  Results for orders placed or performed during the hospital encounter of 08/16/15 (from the past 48 hour(s))  Glucose, capillary     Status: Abnormal   Collection Time: 08/18/15 11:40 PM  Result Value Ref Range   Glucose-Capillary 157 (H) 65 - 99 mg/dL   Comment 1 Notify RN   Glucose, capillary     Status: Abnormal   Collection Time: 08/19/15  4:01 AM  Result Value Ref Range   Glucose-Capillary 142 (H) 65 - 99 mg/dL   Comment 1 Notify RN   CBC     Status: Abnormal   Collection Time: 08/19/15  4:58 AM  Result Value Ref Range   WBC 10.2 4.0 - 10.5 K/uL   RBC 3.99 (L) 4.22 - 5.81 MIL/uL   Hemoglobin 10.9 (L) 13.0 - 17.0 g/dL   HCT 36.6 (L) 39.0 - 52.0 %   MCV 91.7 78.0 - 100.0 fL   MCH 27.3 26.0 - 34.0 pg   MCHC 29.8 (L) 30.0 - 36.0 g/dL   RDW 13.1 11.5 - 15.5 %   Platelets 174 150 - 400 K/uL  Basic metabolic panel     Status: Abnormal   Collection Time: 08/19/15  4:58 AM  Result Value Ref Range   Sodium 143 135 - 145 mmol/L   Potassium 4.3 3.5 - 5.1 mmol/L   Chloride 104 101 - 111 mmol/L   CO2 34 (H) 22 - 32 mmol/L   Glucose, Bld 143 (H) 65 - 99 mg/dL   BUN 15 6 - 20 mg/dL   Creatinine, Ser 0.86 0.61 - 1.24 mg/dL   Calcium 8.7 (L) 8.9 - 10.3 mg/dL   GFR calc non Af Amer >60 >60 mL/min   GFR calc Af Amer >60 >60 mL/min    Comment: (NOTE) The eGFR has been calculated using the CKD EPI equation. This calculation has not been validated in all  clinical situations. eGFR's persistently <60 mL/min signify possible Chronic Kidney Disease.    Anion gap 5 5 - 15  Glucose, capillary     Status: Abnormal   Collection Time: 08/19/15  7:09 AM  Result Value Ref Range   Glucose-Capillary 122 (H) 65 - 99 mg/dL  Glucose, capillary     Status: Abnormal   Collection Time: 08/19/15 11:09 AM  Result Value Ref Range   Glucose-Capillary 132 (H) 65 - 99 mg/dL   Comment 1 Notify RN    Comment 2 Document in Chart   Glucose, capillary     Status: Abnormal   Collection Time: 08/19/15  4:36 PM  Result Value Ref Range   Glucose-Capillary 180 (H) 65 - 99 mg/dL  Glucose, capillary     Status: Abnormal   Collection Time: 08/19/15  8:58 PM  Result Value Ref Range   Glucose-Capillary 156 (H) 65 - 99 mg/dL   Comment 1 Document in Chart   Basic metabolic panel     Status: Abnormal   Collection Time: 08/20/15  4:52 AM  Result Value Ref Range   Sodium 141 135 - 145 mmol/L   Potassium 4.7 3.5 - 5.1 mmol/L   Chloride 102 101 - 111 mmol/L   CO2 34 (H) 22 - 32 mmol/L   Glucose, Bld 199 (H) 65 - 99 mg/dL   BUN 19 6 -  20 mg/dL   Creatinine, Ser 0.82 0.61 - 1.24 mg/dL   Calcium 8.6 (L) 8.9 - 10.3 mg/dL   GFR calc non Af Amer >60 >60 mL/min   GFR calc Af Amer >60 >60 mL/min    Comment: (NOTE) The eGFR has been calculated using the CKD EPI equation. This calculation has not been validated in all clinical situations. eGFR's persistently <60 mL/min signify possible Chronic Kidney Disease.    Anion gap 5 5 - 15  CBC     Status: Abnormal   Collection Time: 08/20/15  4:52 AM  Result Value Ref Range   WBC 12.0 (H) 4.0 - 10.5 K/uL   RBC 4.12 (L) 4.22 - 5.81 MIL/uL   Hemoglobin 11.2 (L) 13.0 - 17.0 g/dL   HCT 38.2 (L) 39.0 - 52.0 %   MCV 92.7 78.0 - 100.0 fL   MCH 27.2 26.0 - 34.0 pg   MCHC 29.3 (L) 30.0 - 36.0 g/dL   RDW 13.2 11.5 - 15.5 %   Platelets 200 150 - 400 K/uL  Glucose, capillary     Status: Abnormal   Collection Time: 08/20/15  7:27 AM   Result Value Ref Range   Glucose-Capillary 174 (H) 65 - 99 mg/dL   Comment 1 Document in Chart   Glucose, capillary     Status: Abnormal   Collection Time: 08/20/15 11:26 AM  Result Value Ref Range   Glucose-Capillary 246 (H) 65 - 99 mg/dL   Comment 1 Document in Chart   Glucose, capillary     Status: Abnormal   Collection Time: 08/20/15  5:03 PM  Result Value Ref Range   Glucose-Capillary 222 (H) 65 - 99 mg/dL   Comment 1 Notify RN   Glucose, capillary     Status: Abnormal   Collection Time: 08/20/15  9:31 PM  Result Value Ref Range   Glucose-Capillary 117 (H) 65 - 99 mg/dL   Comment 1 Notify RN    Comment 2 Document in Chart     Vitals: Blood pressure 153/80, pulse 60, temperature 97.5 F (36.4 C), temperature source Oral, resp. rate 20, height _0  (1.753 m), weight 138.1 kg (304 lb 7.3 oz), SpO2 96 %.  Risk to Self: Is patient at risk for suicide?: No Risk to Others:No   Prior Inpatient Therapy: [facility] Prior Outpatient Therapy:   Current Facility-Administered Medications  Medication Dose Route Frequency Provider Last Rate Last Dose  . acetaminophen (TYLENOL) tablet 650 mg  650 mg Oral Q6H PRN Samuella Cota, MD   650 mg at 08/19/15 2114   Or  . acetaminophen (TYLENOL) suppository 650 mg  650 mg Rectal Q6H PRN Samuella Cota, MD      . albuterol (PROVENTIL) (2.5 MG/3ML) 0.083% nebulizer solution 2.5 mg  2.5 mg Nebulization Q2H PRN Samuella Cota, MD   2.5 mg at 08/18/15 1755  . albuterol (PROVENTIL) (2.5 MG/3ML) 0.083% nebulizer solution 2.5 mg  2.5 mg Nebulization Q4H Sinda Du, MD   2.5 mg at 08/20/15 1545  . amoxicillin-clavulanate (AUGMENTIN) 875-125 MG per tablet 1 tablet  1 tablet Oral Q12H Kathie Dike, MD   1 tablet at 08/20/15 1113  . aspirin chewable tablet 81 mg  81 mg Per Tube q morning - 10a Samuella Cota, MD   81 mg at 08/20/15 1113  . clopidogrel (PLAVIX) tablet 75 mg  75 mg Oral q morning - 10a Kathie Dike, MD   75 mg at  08/20/15 1115  . enoxaparin (  LOVENOX) injection 40 mg  40 mg Subcutaneous Q24H Samuella Cota, MD   40 mg at 08/19/15 2114  . guaiFENesin (MUCINEX) 12 hr tablet 1,200 mg  1,200 mg Oral BID Kathie Dike, MD   1,200 mg at 08/20/15 1113  . hydrALAZINE (APRESOLINE) injection 10 mg  10 mg Intravenous Q6H PRN Kathie Dike, MD   10 mg at 08/19/15 1616  . insulin aspart (novoLOG) injection 0-20 Units  0-20 Units Subcutaneous TID WC & HS Kathie Dike, MD   7 Units at 08/20/15 1709  . insulin glargine (LANTUS) injection 25 Units  25 Units Subcutaneous BID Kathie Dike, MD   25 Units at 08/20/15 1117  . LORazepam (ATIVAN) tablet 0.5 mg  0.5 mg Oral BID Rhetta Mura Schorr, NP   0.5 mg at 08/20/15 1114  . metoprolol (LOPRESSOR) tablet 50 mg  50 mg Oral BID Kathie Dike, MD   50 mg at 08/20/15 1113  . pantoprazole (PROTONIX) injection 40 mg  40 mg Intravenous Daily Samuella Cota, MD   40 mg at 08/20/15 1115  . [START ON 08/21/2015] predniSONE (DELTASONE) tablet 40 mg  40 mg Oral Q breakfast Kathie Dike, MD      . simvastatin (ZOCOR) tablet 40 mg  40 mg Oral QPM Kathie Dike, MD   40 mg at 08/20/15 1709  . sodium chloride 0.9 % injection 3 mL  3 mL Intravenous Q12H Samuella Cota, MD   3 mL at 08/20/15 0759  . terazosin (HYTRIN) capsule 1 mg  1 mg Oral BID Kathie Dike, MD   1 mg at 08/20/15 1114  . tiotropium (SPIRIVA) inhalation capsule 18 mcg  18 mcg Inhalation q morning - 10a Samuella Cota, MD   18 mcg at 08/20/15 0900  . traZODone (DESYREL) tablet 300 mg  300 mg Oral QHS Rhetta Mura Schorr, NP   300 mg at 08/19/15 2218  . ziprasidone (GEODON) capsule 80 mg  80 mg Oral QPM Kathie Dike, MD   80 mg at 08/20/15 1709    Musculoskeletal: Strength & Muscle Tone: abnormal Gait & Station: unsteady Patient leans: Backward by PT note  Psychiatric Specialty Exam: Physical Exam  Vitals reviewed. Constitutional: He is oriented to person, place, and time.  Morbid obesity Pt lying  in hospital bed  HENT:  Head: Normocephalic and atraumatic.  Right Ear: External ear normal.  Left Ear: External ear normal.  Nose: Nose normal.  Eyes: Conjunctivae and EOM are normal. Right eye exhibits no discharge. Left eye exhibits no discharge. No scleral icterus.  Neck: Normal range of motion.  Cardiovascular: Regular rhythm.   Respiratory: No stridor.  SOB with conversation  GI:  obese  Genitourinary:  deferred  Musculoskeletal:  Hx of falls/fractures  Neurological: He is alert and oriented to person, place, and time. No cranial nerve deficit.  Skin: No rash noted.  Psychiatric:  See PSE    Review of Systems  Constitutional: Positive for malaise/fatigue. Negative for fever, chills, weight loss and diaphoresis.  Eyes: Negative.  Negative for blurred vision, double vision, photophobia, pain, discharge and redness.  Respiratory: Positive for cough, sputum production, shortness of breath and wheezing. Negative for hemoptysis and stridor.        Aspiration pneumonia O2 dependent 3 LPM Bi Pap -?compliance  Cardiovascular: Positive for leg swelling and PND. Negative for chest pain, palpitations, orthopnea and claudication.  Gastrointestinal: Positive for heartburn. Negative for nausea, vomiting, abdominal pain, diarrhea, constipation, blood in stool and melena.  Genitourinary: Positive  for frequency.       Nocturia  Musculoskeletal: Positive for back pain, joint pain, falls and neck pain.       Chronic pain with RX for Benzos/Long and short actting opioids and hx of overdosing himself for his symptoms with resultant confusion/SOB/falls with fractures and multiple ED admissions for same  Skin: Negative.   Neurological: Positive for weakness. Negative for dizziness, tingling, tremors, sensory change, speech change and seizures.       Symptoms from admission and prior hx are not present tonite  Endo/Heme/Allergies: Negative for environmental allergies and polydipsia. Does not  bruise/bleed easily.       IDDM  Psychiatric/Behavioral: Positive for memory loss (Not tested CT shows atrophy and multiple white matter ischemic changes) and substance abuse. Negative for depression, suicidal ideas and hallucinations. The patient is nervous/anxious and has insomnia.     Blood pressure 153/80, pulse 60, temperature 97.5 F (36.4 C), temperature source Oral, resp. rate 20, height _0  (1.753 m), weight 138.1 kg (304 lb 7.3 oz), SpO2 96 %.Body mass index is 44.94 kg/(m^2).  General Appearance: Fairly Groomed and lying head elevated in hospital bed  Eye Contact::  Good  Speech:  Clear and Coherent and SOB  Volume:  Normal until SOB  Mood:  Euthymic  Affect:  Appropriate and Congruent  Thought Process:  Coherent and Intact  Orientation:  Full (Time, Place, and Person)  Thought Content:  Negative  Suicidal Thoughts:  No  Homicidal Thoughts:  No  Memory:  Recent;   Good  Judgement:  Poor  Insight:  Lacking  Psychomotor Activity:  Decreased  Concentration:  Intact for consult  Recall:  Good  Fund of Knowledge:Fair  Language: Good  Akathisia:  NA  Handed:  Right  AIMS (if indicated):  Not tested  Assets:  Financial Resources/Insurance Housing Social Support  ADL's:  Impaired  Cognition: WNL for consult  Sleep:  Hx of insomnia see Dr Jola Schmidt note   Medical Decision Making: Established Problem, Stable/Improving (1), Review or order clinical lab tests (1) and Review and summation of old records (2)   Treatment Plan Summary: Recommend pt not self medicate May benefit from problem specific CBT/DBT around his anxiety for dyspnea which he currently medicates  Plan:  No evidence of imminent risk to self or others at present.   Patient does not meet criteria for psychiatric inpatient admission. Supportive therapy provided about ongoing stressors. Provide OP contact information Disposition: As above  Darlyne Russian 08/20/2015 9:59 PM      Dr Roderic Palau

## 2015-08-20 NOTE — Progress Notes (Signed)
Subjective: He extubated himself yesterday. He has done well after that. He has been stable on 3 L nasal cannula. He was intubated and placed on the ventilator because of Ativan overdose with respiratory failure encephalopathy and aspiration pneumonia. He is still being treated for aspiration pneumonia. At baseline he has paranoid schizophrenia which of course complicates the situation. He also has COPD with chronic hypoxic respiratory failure at baseline  Objective: Vital signs in last 24 hours: Temp:  [97.6 F (36.4 C)-98.4 F (36.9 C)] 97.6 F (36.4 C) (12/03 0756) Pulse Rate:  [55-107] 67 (12/03 0800) Resp:  [7-20] 15 (12/03 0800) BP: (106-205)/(62-116) 156/78 mmHg (12/03 0800) SpO2:  [85 %-100 %] 97 % (12/03 0901) FiO2 (%):  [40 %] 40 % (12/02 1332) Weight:  [138.1 kg (304 lb 7.3 oz)] 138.1 kg (304 lb 7.3 oz) (12/03 0528) Weight change: -2.5 kg (-5 lb 8.2 oz) Last BM Date:  (unknown)  Intake/Output from previous day: 12/02 0701 - 12/03 0700 In: 1068.5 [P.O.:240; I.V.:228.5; IV Piggyback:600] Out: 1650 [Urine:1650]  PHYSICAL EXAM General appearance: alert, cooperative and mild distress Resp: rhonchi bilaterally Cardio: regular rate and rhythm, S1, S2 normal, no murmur, click, rub or gallop GI: soft, non-tender; bowel sounds normal; no masses,  no organomegaly Extremities: extremities normal, atraumatic, no cyanosis or edema  Lab Results:  Results for orders placed or performed during the hospital encounter of 08/16/15 (from the past 48 hour(s))  Glucose, capillary     Status: Abnormal   Collection Time: 08/18/15 11:31 AM  Result Value Ref Range   Glucose-Capillary 183 (H) 65 - 99 mg/dL   Comment 1 Notify RN    Comment 2 Document in Chart   Glucose, capillary     Status: Abnormal   Collection Time: 08/18/15  4:41 PM  Result Value Ref Range   Glucose-Capillary 187 (H) 65 - 99 mg/dL   Comment 1 Notify RN   Glucose, capillary     Status: Abnormal   Collection Time:  08/18/15  7:47 PM  Result Value Ref Range   Glucose-Capillary 159 (H) 65 - 99 mg/dL   Comment 1 Notify RN    Comment 2 Document in Chart   Glucose, capillary     Status: Abnormal   Collection Time: 08/18/15 11:40 PM  Result Value Ref Range   Glucose-Capillary 157 (H) 65 - 99 mg/dL   Comment 1 Notify RN   Glucose, capillary     Status: Abnormal   Collection Time: 08/19/15  4:01 AM  Result Value Ref Range   Glucose-Capillary 142 (H) 65 - 99 mg/dL   Comment 1 Notify RN   CBC     Status: Abnormal   Collection Time: 08/19/15  4:58 AM  Result Value Ref Range   WBC 10.2 4.0 - 10.5 K/uL   RBC 3.99 (L) 4.22 - 5.81 MIL/uL   Hemoglobin 10.9 (L) 13.0 - 17.0 g/dL   HCT 36.6 (L) 39.0 - 52.0 %   MCV 91.7 78.0 - 100.0 fL   MCH 27.3 26.0 - 34.0 pg   MCHC 29.8 (L) 30.0 - 36.0 g/dL   RDW 13.1 11.5 - 15.5 %   Platelets 174 150 - 400 K/uL  Basic metabolic panel     Status: Abnormal   Collection Time: 08/19/15  4:58 AM  Result Value Ref Range   Sodium 143 135 - 145 mmol/L   Potassium 4.3 3.5 - 5.1 mmol/L   Chloride 104 101 - 111 mmol/L   CO2 34 (H) 22 -  32 mmol/L   Glucose, Bld 143 (H) 65 - 99 mg/dL   BUN 15 6 - 20 mg/dL   Creatinine, Ser 0.86 0.61 - 1.24 mg/dL   Calcium 8.7 (L) 8.9 - 10.3 mg/dL   GFR calc non Af Amer >60 >60 mL/min   GFR calc Af Amer >60 >60 mL/min    Comment: (NOTE) The eGFR has been calculated using the CKD EPI equation. This calculation has not been validated in all clinical situations. eGFR's persistently <60 mL/min signify possible Chronic Kidney Disease.    Anion gap 5 5 - 15  Glucose, capillary     Status: Abnormal   Collection Time: 08/19/15  7:09 AM  Result Value Ref Range   Glucose-Capillary 122 (H) 65 - 99 mg/dL  Glucose, capillary     Status: Abnormal   Collection Time: 08/19/15 11:09 AM  Result Value Ref Range   Glucose-Capillary 132 (H) 65 - 99 mg/dL   Comment 1 Notify RN    Comment 2 Document in Chart   Glucose, capillary     Status: Abnormal    Collection Time: 08/19/15  4:36 PM  Result Value Ref Range   Glucose-Capillary 180 (H) 65 - 99 mg/dL  Glucose, capillary     Status: Abnormal   Collection Time: 08/19/15  8:58 PM  Result Value Ref Range   Glucose-Capillary 156 (H) 65 - 99 mg/dL   Comment 1 Document in Chart   Basic metabolic panel     Status: Abnormal   Collection Time: 08/20/15  4:52 AM  Result Value Ref Range   Sodium 141 135 - 145 mmol/L   Potassium 4.7 3.5 - 5.1 mmol/L   Chloride 102 101 - 111 mmol/L   CO2 34 (H) 22 - 32 mmol/L   Glucose, Bld 199 (H) 65 - 99 mg/dL   BUN 19 6 - 20 mg/dL   Creatinine, Ser 0.82 0.61 - 1.24 mg/dL   Calcium 8.6 (L) 8.9 - 10.3 mg/dL   GFR calc non Af Amer >60 >60 mL/min   GFR calc Af Amer >60 >60 mL/min    Comment: (NOTE) The eGFR has been calculated using the CKD EPI equation. This calculation has not been validated in all clinical situations. eGFR's persistently <60 mL/min signify possible Chronic Kidney Disease.    Anion gap 5 5 - 15  CBC     Status: Abnormal   Collection Time: 08/20/15  4:52 AM  Result Value Ref Range   WBC 12.0 (H) 4.0 - 10.5 K/uL   RBC 4.12 (L) 4.22 - 5.81 MIL/uL   Hemoglobin 11.2 (L) 13.0 - 17.0 g/dL   HCT 38.2 (L) 39.0 - 52.0 %   MCV 92.7 78.0 - 100.0 fL   MCH 27.2 26.0 - 34.0 pg   MCHC 29.3 (L) 30.0 - 36.0 g/dL   RDW 13.2 11.5 - 15.5 %   Platelets 200 150 - 400 K/uL  Glucose, capillary     Status: Abnormal   Collection Time: 08/20/15  7:27 AM  Result Value Ref Range   Glucose-Capillary 174 (H) 65 - 99 mg/dL   Comment 1 Document in Chart     ABGS  Recent Labs  08/18/15 0325  PHART 7.384  PO2ART 63.8*  TCO2 13.5  HCO3 31.5*   CULTURES Recent Results (from the past 240 hour(s))  Blood culture (routine x 2)     Status: None (Preliminary result)   Collection Time: 08/16/15 12:08 PM  Result Value Ref Range Status   Specimen  Description BLOOD LEFT ANTECUBITAL  Final   Special Requests BOTTLES DRAWN AEROBIC AND ANAEROBIC 4CC EACH  Final    Culture NO GROWTH 4 DAYS  Final   Report Status PENDING  Incomplete  Blood culture (routine x 2)     Status: None (Preliminary result)   Collection Time: 08/16/15 12:27 PM  Result Value Ref Range Status   Specimen Description BLOOD RIGHT HAND  Final   Special Requests BOTTLES DRAWN AEROBIC ONLY 4CC  Final   Culture NO GROWTH 4 DAYS  Final   Report Status PENDING  Incomplete  MRSA PCR Screening     Status: None   Collection Time: 08/16/15  4:00 PM  Result Value Ref Range Status   MRSA by PCR NEGATIVE NEGATIVE Final    Comment:        The GeneXpert MRSA Assay (FDA approved for NASAL specimens only), is one component of a comprehensive MRSA colonization surveillance program. It is not intended to diagnose MRSA infection nor to guide or monitor treatment for MRSA infections.   Culture, respiratory (NON-Expectorated)     Status: None   Collection Time: 08/16/15  4:25 PM  Result Value Ref Range Status   Specimen Description TRACHEAL ASPIRATE  Final   Special Requests NONE  Final   Gram Stain   Final    MODERATE WBC PRESENT,BOTH PMN AND MONONUCLEAR RARE SQUAMOUS EPITHELIAL CELLS PRESENT FEW GRAM POSITIVE COCCI IN PAIRS IN CLUSTERS Performed at [facility]    Culture   Final    NORMAL OROPHARYNGEAL FLORA Performed at [facility]    Report Status 08/19/2015 FINAL  Final   Studies/Results: Dg Chest Port 1 View  08/20/2015  CLINICAL DATA:  Shortness of breath.  Respiratory failure EXAM: PORTABLE CHEST 1 VIEW COMPARISON:  Yesterday FINDINGS: Exam is limited by motion degradation. Interval tracheal and esophageal extubation. Improved visualization of the basilar lungs. Interstitial coarsening without overt edema. Stable cardiopericardial enlargement and vascular pedicle widening. IMPRESSION: 1. Improved inflation after extubation. 2. Pulmonary venous congestion without edema. 3. COPD. Electronically Signed   By: Monte Fantasia M.D.   On: 08/20/2015 09:06   Dg  Chest Port 1 View  08/19/2015  CLINICAL DATA:  Acute and chronic respiratory failure with oxygen dependency, acute encephalopathy, history of COPD and aspiration pneumonia EXAM: PORTABLE CHEST 1 VIEW COMPARISON:  Portable chest x-ray of August 18, 2015 FINDINGS: The lungs are adequately inflated. The interstitial markings are coarse. The retrocardiac region on the left remains dense. The cardiac silhouette remains enlarged. The pulmonary vascularity is not clearly engorged. The endotracheal tube tip lies 4 cm above the carina. The esophagogastric tube tip projects below the inferior margin of the image. IMPRESSION: Persistent left lower lobe atelectasis and possible small pleural effusion. Stable cardiomegaly. The support tubes are in reasonable position. Electronically Signed   By: David  Martinique M.D.   On: 08/19/2015 07:44    Medications:  Prior to Admission:  Prescriptions prior to admission  Medication Sig Dispense Refill Last Dose  . aspirin 81 MG chewable tablet Chew 81 mg by mouth every morning.    08/15/2015 at Unknown time  . cholecalciferol (VITAMIN D) 1000 UNITS tablet Take 2,000 Units by mouth daily.   08/15/2015 at Unknown time  . Cinnamon 500 MG capsule Take 1,000 mg by mouth every morning.    08/15/2015 at Unknown time  . citalopram (CELEXA) 20 MG tablet Take 20 mg by mouth every morning.   08/15/2015 at Unknown time  .  clopidogrel (PLAVIX) 75 MG tablet Take 75 mg by mouth every morning.   08/15/2015 at Unknown time  . furosemide (LASIX) 40 MG tablet Take 1 tablet (40 mg total) by mouth daily. 30 tablet 1 08/15/2015 at Unknown time  . gabapentin (NEURONTIN) 300 MG capsule Take 300 mg by mouth 4 (four) times daily.    08/15/2015 at Unknown time  . Insulin Human (INSULIN PUMP) SOLN Inject into the skin continuous. Humalog   08/16/2015 at Unknown time  . LORazepam (ATIVAN) 1 MG tablet Take 0.5 tablets (0.5 mg total) by mouth 2 (two) times daily as needed for anxiety. 20 tablet 0  08/15/2015 at Unknown time  . metoprolol (LOPRESSOR) 50 MG tablet Take 50 mg by mouth 2 (two) times daily.   08/15/2015 at Unknown time  . pantoprazole (PROTONIX) 40 MG tablet Take 40 mg by mouth 2 (two) times daily.   08/15/2015 at Unknown time  . simvastatin (ZOCOR) 40 MG tablet Take 40 mg by mouth every evening.   08/15/2015 at Unknown time  . sodium chloride 1 G tablet Take 1 g by mouth 2 (two) times daily.   08/15/2015 at Unknown time  . terazosin (HYTRIN) 1 MG capsule Take 1 mg by mouth 2 (two) times daily.    08/15/2015 at Unknown time  . tiotropium (SPIRIVA HANDIHALER) 18 MCG inhalation capsule Place 1 capsule (18 mcg total) into inhaler and inhale every morning. 30 capsule 12 08/15/2015 at Unknown time  . traZODone (DESYREL) 150 MG tablet Take 300 mg by mouth at bedtime.    08/15/2015 at Unknown time  . ziprasidone (GEODON) 80 MG capsule Take 80 mg by mouth every evening.   08/15/2015 at Unknown time  . albuterol (PROVENTIL HFA;VENTOLIN HFA) 108 (90 BASE) MCG/ACT inhaler Inhale 2 puffs into the lungs every 6 (six) hours as needed for wheezing or shortness of breath. 1 Inhaler 2 unknown  . albuterol (PROVENTIL) (2.5 MG/3ML) 0.083% nebulizer solution Take 3 mLs (2.5 mg total) by nebulization every 6 (six) hours as needed for wheezing or shortness of breath. 75 mL 0 unknown  . HYDROcodone-acetaminophen (NORCO/VICODIN) 5-325 MG per tablet Take 1 tablet by mouth every 4 (four) hours as needed for moderate pain.    unknown  . Tetrahydrozoline HCl (VISINE OP) Apply 1 drop to eye daily as needed (irritation).    unknown  . triamcinolone cream (KENALOG) 0.1 % Apply 1 application topically 2 (two) times daily as needed (for irritation).    unknown   Scheduled: . albuterol  2.5 mg Nebulization Q4H  . amoxicillin-clavulanate  1 tablet Oral Q12H  . aspirin  81 mg Per Tube q morning - 10a  . clopidogrel  75 mg Oral q morning - 10a  . enoxaparin (LOVENOX) injection  40 mg Subcutaneous Q24H  .  guaiFENesin  1,200 mg Oral BID  . insulin aspart  0-20 Units Subcutaneous TID WC & HS  . insulin glargine  25 Units Subcutaneous BID  . LORazepam  0.5 mg Oral BID  . metoprolol  50 mg Oral BID  . pantoprazole (PROTONIX) IV  40 mg Intravenous Daily  . [START ON 08/21/2015] predniSONE  40 mg Oral Q breakfast  . simvastatin  40 mg Oral QPM  . sodium chloride  3 mL Intravenous Q12H  . terazosin  1 mg Oral BID  . tiotropium  18 mcg Inhalation q morning - 10a  . traZODone  300 mg Oral QHS  . ziprasidone  80 mg Oral QPM   Continuous:  [id number] **OR** acetaminophen, albuterol, hydrALAZINE  Assesment: He had acute ventilator-dependent respiratory failure because of his baseline chronic hypoxic respiratory failure COPD, sleep apnea and lorazepam overdose. He self extubated yesterday and has done pretty well. His COPD is being treated with oxygen and bronchodilators IV steroids. His pneumonia is being treated with antibiotics. He has been noncompliant at home with C Pap and I don't think that's going to change   Principal Problem:   Acute respiratory failure with hypercapnia ([place]) Active Problems:   Obstructive sleep apnea   Paranoid schizophrenia ([place])   DM (diabetes mellitus) ([place])   Chronic respiratory failure 02 dep with hypercarbia   Morbid obesity ([place])   Acute encephalopathy   COPD (chronic obstructive pulmonary disease) ([facility])   Obesity hypoventilation syndrome ([facility])   Aspiration pneumonia ([facility])   Lorazepam overdose    Plan: Continue current treatments. Agree with steroid taper. Continue antibiotics. Continue nebulizer treatments.    LOS: 4 days   Syreeta Figler L 08/20/2015, 9:30 AM

## 2015-08-21 LAB — BASIC METABOLIC PANEL
ANION GAP: 6 (ref 5–15)
BUN: 15 mg/dL (ref 6–20)
CALCIUM: 8.5 mg/dL — AB (ref 8.9–10.3)
CHLORIDE: 96 mmol/L — AB (ref 101–111)
CO2: 33 mmol/L — AB (ref 22–32)
CREATININE: 0.8 mg/dL (ref 0.61–1.24)
GFR calc non Af Amer: >60 mL/min (ref >60)
Glucose, Bld: 108 mg/dL — ABNORMAL HIGH (ref 65–99)
Potassium: 3.4 mmol/L — ABNORMAL LOW (ref 3.5–5.1)
SODIUM: 135 mmol/L (ref 135–145)

## 2015-08-21 LAB — GLUCOSE, CAPILLARY
GLUCOSE-CAPILLARY: 117 mg/dL — AB (ref 65–99)
GLUCOSE-CAPILLARY: 193 mg/dL — AB (ref 65–99)

## 2015-08-21 MED ORDER — PREDNISONE 10 MG PO TABS: ORAL_TABLET | ORAL | Status: DC

## 2015-08-21 MED ORDER — ALBUTEROL SULFATE (2.5 MG/3ML) 0.083% IN NEBU: 2.5000 mg | INHALATION_SOLUTION | Freq: Four times a day (QID) | RESPIRATORY_TRACT | Status: DC | PRN

## 2015-08-21 MED ORDER — AMOXICILLIN-POT CLAVULANATE 875-125 MG PO TABS: 1.0000 | ORAL_TABLET | Freq: Two times a day (BID) | ORAL | Status: DC

## 2015-08-21 NOTE — Discharge Summary (Signed)
Physician Discharge Summary  Rick Cruz [id number] DOB: 06-Mar-1946 DOA: 08/16/2015  PCP: [name], MD  Admit date: 08/16/2015 Discharge date: 08/21/2015  Time spent: 30 minutes  Recommendations for Outpatient Follow-up:  1. Follow up with PCP in 1-2 weeks. Repeat CXR in 3-4 weeks for resolution of PNA    Discharge Diagnoses:  Principal Problem:   Acute respiratory failure with hypercapnia (HCC) Active Problems:   Obstructive sleep apnea   Paranoid schizophrenia ([place])   DM (diabetes mellitus) ([place])   Chronic respiratory failure 02 dep with hypercarbia   Morbid obesity (HCC)   Acute encephalopathy   COPD (chronic obstructive pulmonary disease) (HCC)   Obesity hypoventilation syndrome (HCC)   Aspiration pneumonia (HCC)   Lorazepam overdose   Discharge Condition: Improved  Diet recommendation: Heart healthy  Filed Weights   08/18/15 0500 08/19/15 0500 08/20/15 0528  Weight: 139.5 kg (307 lb 8.7 oz) 140.6 kg (309 lb 15.5 oz) 138.1 kg (304 lb 7.3 oz)    History of present illness:  69 y/o male with hx schizophrenia and depression presented to the ED for lethargy and suspected Ativan overdose. History obtained from wife at that time. Pt saw PCP the day before his admission and was given Rx for Ativan #60. Not recently on Ativan. Early AM he called for help and when she responded she noticed he was undressed, diaphoretic, sitting in chair, had urinated on self. He reported taking one Ativan.  Wife found the Ativan bottle, was empty with no pills found anywhere. She doubed SI, he has a history of accidentally overdosing and abuse of multiple substances. While in the ED he was noted to be lethargic and concern for protecting his airway, so he was iIntubated. CXR revealed RLL pneumonia. He was admitted for further evaluation and treatment.   Hospital Course:  Patient presented s/p an ativan overdose with resultant resp failure, encephalopathy and aspiration pneumonia. His  lactic acid remained normal and he did not exhibit any evidence of sepsis. Admitting physician discussed overdose with poison control who recommended supportive care. Patient required intubation due to not being able to maintain his own airway. Pulmonology followed and he self-extubated 12/2. He has remained stable on his baseline 3L of O2.   Patient was seen by psychiatry, they did not feel he was an imminent threat to himself. They do not believe he meets inpatient criteria.   Suspected RLL/lobar aspiration pneumonia. He is afebrile with a stable WBC. Patient received IV abx, steroids, and albuterol treatments with significant improvement. He was transitioned to oral abx and a steroid taper upon discharge.   1. Acute encephalopathy, secondary to above. Resolved. 2. COPD, chronic hypoxic resp failure. Continue home medications and steroid taper. 3. DM on insulin pump, removed. CBG stable, continue home medicine.  4. Depression, PMH addiction, alcohol, pain meds. Alcohol remote. No recent BZD use until 11/28. Psychiatry consulted and does not recommend inpatient treatment. They have provided resources for outpatient follow up.  5. Chronic pain on hydrocode/APAP. Acetaminophen level normal. 6. Essential HTN, continue home meds. 7. Schizophrenia, continue home meds.  8. HLD, continue statin.  9. OSA, non-compliant. Patient educated on the importance of compliance with his BiPAP at night.  10. Anxiety. Advised to discontinue Ativan and follow up with PCP.   Consultants:  Pulmonology  Psychiatry  Procedures:  ETT 11/29>>12/2  Discharge Exam: Filed Vitals:   08/20/15 2132 08/21/15 0433  BP: 153/80 122/63  Pulse: 60 75  Temp: 97.5 F (36.4 C) 97.8 F (  36.6 C)  Resp: 20 20     General: NAD, looks comfortable  Cardiovascular: RRR, S1, S2   Respiratory: clear bilaterally, No wheezing, rales or rhonchi  Abdomen: soft, non tender, no distention , bowel sounds  normal  Musculoskeletal: No edema b/l   Discharge Instructions    Current Discharge Medication List    CONTINUE these medications which have NOT CHANGED   Details  aspirin 81 MG chewable tablet Chew 81 mg by mouth every morning.     cholecalciferol (VITAMIN D) 1000 UNITS tablet Take 2,000 Units by mouth daily.    Cinnamon 500 MG capsule Take 1,000 mg by mouth every morning.     citalopram (CELEXA) 20 MG tablet Take 20 mg by mouth every morning.    clopidogrel (PLAVIX) 75 MG tablet Take 75 mg by mouth every morning.    furosemide (LASIX) 40 MG tablet Take 1 tablet (40 mg total) by mouth daily. Qty: 30 tablet, Refills: 1    gabapentin (NEURONTIN) 300 MG capsule Take 300 mg by mouth 4 (four) times daily.     Insulin Human (INSULIN PUMP) SOLN Inject into the skin continuous. Humalog    LORazepam (ATIVAN) 1 MG tablet Take 0.5 tablets (0.5 mg total) by mouth 2 (two) times daily as needed for anxiety. Qty: 20 tablet, Refills: 0    metoprolol (LOPRESSOR) 50 MG tablet Take 50 mg by mouth 2 (two) times daily.    pantoprazole (PROTONIX) 40 MG tablet Take 40 mg by mouth 2 (two) times daily.    simvastatin (ZOCOR) 40 MG tablet Take 40 mg by mouth every evening.    sodium chloride 1 G tablet Take 1 g by mouth 2 (two) times daily.    terazosin (HYTRIN) 1 MG capsule Take 1 mg by mouth 2 (two) times daily.     tiotropium (SPIRIVA HANDIHALER) 18 MCG inhalation capsule Place 1 capsule (18 mcg total) into inhaler and inhale every morning. Qty: 30 capsule, Refills: 12    traZODone (DESYREL) 150 MG tablet Take 300 mg by mouth at bedtime.     ziprasidone (GEODON) 80 MG capsule Take 80 mg by mouth every evening.    albuterol (PROVENTIL HFA;VENTOLIN HFA) 108 (90 BASE) MCG/ACT inhaler Inhale 2 puffs into the lungs every 6 (six) hours as needed for wheezing or shortness of breath. Qty: 1 Inhaler, Refills: 2    albuterol (PROVENTIL) (2.5 MG/3ML) 0.083% nebulizer solution Take 3 mLs (2.5 mg  total) by nebulization every 6 (six) hours as needed for wheezing or shortness of breath. Qty: 75 mL, Refills: 0    HYDROcodone-acetaminophen (NORCO/VICODIN) 5-325 MG per tablet Take 1 tablet by mouth every 4 (four) hours as needed for moderate pain.     Tetrahydrozoline HCl (VISINE OP) Apply 1 drop to eye daily as needed (irritation).     triamcinolone cream (KENALOG) 0.1 % Apply 1 application topically 2 (two) times daily as needed (for irritation).        Allergies  Allergen Reactions  . Phenergan [Promethazine Hcl] Other (See Comments)    Becomes very confused and aggressive  . Haldol [Haloperidol Lactate] Other (See Comments)    Shaking   . Metformin And Related Diarrhea      The results of significant diagnostics from this hospitalization (including imaging, microbiology, ancillary and laboratory) are listed below for reference.    Significant Diagnostic Studies: Ct Head Wo Contrast  08/16/2015  CLINICAL DATA:  Altered mental status.  Possible overdose. EXAM: CT HEAD WITHOUT CONTRAST TECHNIQUE: Contiguous  axial images were obtained from the base of the skull through the vertex without intravenous contrast. COMPARISON:  CT 12/05/2014 FINDINGS: Mild atrophy. Negative for hydrocephalus. Mild chronic microvascular ischemic change in the white matter. Negative for acute infarct.  Negative for hemorrhage or mass. Negative calvarium. IMPRESSION: Atrophy and chronic microvascular ischemia.  No acute abnormality. Electronically Signed   By: Franchot Gallo M.D.   On: 08/16/2015 15:13   Dg Chest Port 1 View  08/20/2015  CLINICAL DATA:  Shortness of breath.  Respiratory failure EXAM: PORTABLE CHEST 1 VIEW COMPARISON:  Yesterday FINDINGS: Exam is limited by motion degradation. Interval tracheal and esophageal extubation. Improved visualization of the basilar lungs. Interstitial coarsening without overt edema. Stable cardiopericardial enlargement and vascular pedicle widening. IMPRESSION: 1.  Improved inflation after extubation. 2. Pulmonary venous congestion without edema. 3. COPD. Electronically Signed   By: Monte Fantasia M.D.   On: 08/20/2015 09:06   Dg Chest Port 1 View  08/19/2015  CLINICAL DATA:  Acute and chronic respiratory failure with oxygen dependency, acute encephalopathy, history of COPD and aspiration pneumonia EXAM: PORTABLE CHEST 1 VIEW COMPARISON:  Portable chest x-ray of August 18, 2015 FINDINGS: The lungs are adequately inflated. The interstitial markings are coarse. The retrocardiac region on the left remains dense. The cardiac silhouette remains enlarged. The pulmonary vascularity is not clearly engorged. The endotracheal tube tip lies 4 cm above the carina. The esophagogastric tube tip projects below the inferior margin of the image. IMPRESSION: Persistent left lower lobe atelectasis and possible small pleural effusion. Stable cardiomegaly. The support tubes are in reasonable position. Electronically Signed   By: David  Martinique M.D.   On: 08/19/2015 07:44   Dg Chest Port 1 View  08/18/2015  CLINICAL DATA:  Respiratory distress, chronic respiratory failure, acute encephalopathy, history of COPD, coronary artery disease, diabetes, and morbid obesity. EXAM: PORTABLE CHEST 1 VIEW COMPARISON:  Portable chest x-ray of August 16, 2015 FINDINGS: The lungs are well-expanded. The cardiac silhouette remains enlarged. The retrocardiac region on the left is dense with obscuration of the hemidiaphragm. The pulmonary vascularity is not clearly engorged. The endotracheal tube tip lies 3.8 cm above the carina. The esophagogastric tube tip projects below the inferior margin of the image. IMPRESSION: Improved aeration of the right lung. There is persistent left lower lobe atelectasis with new small left pleural effusion. The support tubes are in reasonable position. Electronically Signed   By: David  Martinique M.D.   On: 08/18/2015 08:08   Dg Chest Portable 1 View  08/16/2015  CLINICAL  DATA:  Drug overdose.  Intubation EXAM: PORTABLE CHEST 1 VIEW COMPARISON:  12/05/2014 FINDINGS: Endotracheal tube in good position. NG tube enters the stomach with the tip not visualized. Right lower lobe airspace disease may represent atelectasis or pneumonia. Possible small right effusion Negative for heart failure.  Left lung clear. IMPRESSION: Endotracheal tube in good position. Right lower lobe airspace consolidation may represent atelectasis or pneumonia. Follow-up recommended. Electronically Signed   By: Franchot Gallo M.D.   On: 08/16/2015 11:48    Microbiology: Recent Results (from the past 240 hour(s))  Blood culture (routine x 2)     Status: None (Preliminary result)   Collection Time: 08/16/15 12:08 PM  Result Value Ref Range Status   Specimen Description BLOOD LEFT ANTECUBITAL  Final   Special Requests BOTTLES DRAWN AEROBIC AND ANAEROBIC 4CC EACH  Final   Culture NO GROWTH 4 DAYS  Final   Report Status PENDING  Incomplete  Blood culture (routine  x 2)     Status: None (Preliminary result)   Collection Time: 08/16/15 12:27 PM  Result Value Ref Range Status   Specimen Description BLOOD RIGHT HAND  Final   Special Requests BOTTLES DRAWN AEROBIC ONLY 4CC  Final   Culture NO GROWTH 4 DAYS  Final   Report Status PENDING  Incomplete  MRSA PCR Screening     Status: None   Collection Time: 08/16/15  4:00 PM  Result Value Ref Range Status   MRSA by PCR NEGATIVE NEGATIVE Final    Comment:        The GeneXpert MRSA Assay (FDA approved for NASAL specimens only), is one component of a comprehensive MRSA colonization surveillance program. It is not intended to diagnose MRSA infection nor to guide or monitor treatment for MRSA infections.   Culture, respiratory (NON-Expectorated)     Status: None   Collection Time: 08/16/15  4:25 PM  Result Value Ref Range Status   Specimen Description TRACHEAL ASPIRATE  Final   Special Requests NONE  Final   Gram Stain   Final    MODERATE WBC  PRESENT,BOTH PMN AND MONONUCLEAR RARE SQUAMOUS EPITHELIAL CELLS PRESENT FEW GRAM POSITIVE COCCI IN PAIRS IN CLUSTERS Performed at [facility]    Culture   Final    NORMAL OROPHARYNGEAL FLORA Performed at [facility]    Report Status 08/19/2015 FINAL  Final     Labs: Basic Metabolic Panel:  Recent Labs Lab 08/17/15 0445 08/18/15 0527 08/19/15 0458 08/20/15 0452 08/21/15 0631  NA 138 142 143 141 135  K 4.3 4.8 4.3 4.7 3.4*  CL 96* 102 104 102 96*  CO2 33* 34* 34* 34* 33*  GLUCOSE 263* 237* 143* 199* 108*  BUN 11 12 15 19 15   CREATININE 1.03 0.89 0.86 0.82 0.80  CALCIUM 8.6* 8.6* 8.7* 8.6* 8.5*   Liver Function Tests:  Recent Labs Lab 08/16/15 1006 08/17/15 0445  AST 10* 12*  ALT 11* 10*  ALKPHOS 70 70  BILITOT 0.7 0.7  PROT 7.2 6.6  ALBUMIN 3.9 3.5    CBC:  Recent Labs Lab 08/16/15 1006 08/17/15 0445 08/18/15 0527 08/19/15 0458 08/20/15 0452  WBC 11.8* 14.7* 10.8* 10.2 12.0*  NEUTROABS 9.7*  --   --   --   --   HGB 12.8* 12.0* 11.4* 10.9* 11.2*  HCT 42.5 40.4 38.5* 36.6* 38.2*  MCV 91.0 91.8 92.3 91.7 92.7  PLT 188 217 186 174 200     CBG:  Recent Labs Lab 08/20/15 0727 08/20/15 1126 08/20/15 1703 08/20/15 2131 08/21/15 0751  GLUCAP 174* 246* 222* 117* 117*      Signed:  Jehanzeb Memon. MD  [facility] 08/21/2015, 10:19 AM   By signing my name below, I, Rosalie Doctor, attest that this documentation has been prepared under the direction and in the presence of [facility]. MD Electronically Signed: Rosalie Doctor, Scribe. 08/21/2015 10:23am   I, Dr. Kathie Dike, personally performed the services described in this documentaiton. All medical record entries made by the scribe were at my direction and in my presence. I have reviewed the chart and agree that the record reflects my personal performance and is accurate and complete  Kathie Dike, MD, 08/21/2015 11:13 AM

## 2015-08-21 NOTE — Progress Notes (Signed)
Patient's wife states understanding of discharge instructions 

## 2015-08-21 NOTE — Progress Notes (Signed)
Plans are for him to be discharged today. I think he is back at baseline. He does need chest x-ray for follow-up to make sure his pneumonia resolves since he is high risk for lung cancer. I will sign off. Thanks for allowing me to see him with you

## 2015-08-22 LAB — CULTURE, BLOOD (ROUTINE X 2)
Culture: NO GROWTH
Culture: NO GROWTH

## 2015-09-18 DIAGNOSIS — J449 Chronic obstructive pulmonary disease, unspecified: Secondary | ICD-10-CM | POA: Diagnosis not present

## 2015-09-18 DIAGNOSIS — S82843A Displaced bimalleolar fracture of unspecified lower leg, initial encounter for closed fracture: Secondary | ICD-10-CM | POA: Diagnosis not present

## 2015-09-18 DIAGNOSIS — G4733 Obstructive sleep apnea (adult) (pediatric): Secondary | ICD-10-CM | POA: Diagnosis not present

## 2015-09-21 DIAGNOSIS — H5440 Blindness, one eye, unspecified eye: Secondary | ICD-10-CM | POA: Diagnosis not present

## 2015-10-04 ENCOUNTER — Ambulatory Visit: Payer: Medicare Other | Admitting: Adult Health

## 2015-10-11 DIAGNOSIS — M542 Cervicalgia: Secondary | ICD-10-CM | POA: Diagnosis not present

## 2015-10-11 DIAGNOSIS — H547 Unspecified visual loss: Secondary | ICD-10-CM | POA: Diagnosis not present

## 2015-10-11 DIAGNOSIS — G4733 Obstructive sleep apnea (adult) (pediatric): Secondary | ICD-10-CM | POA: Diagnosis not present

## 2015-10-11 DIAGNOSIS — M545 Low back pain: Secondary | ICD-10-CM | POA: Diagnosis not present

## 2015-10-11 DIAGNOSIS — J449 Chronic obstructive pulmonary disease, unspecified: Secondary | ICD-10-CM | POA: Diagnosis not present

## 2015-10-11 DIAGNOSIS — E785 Hyperlipidemia, unspecified: Secondary | ICD-10-CM | POA: Diagnosis not present

## 2015-10-11 DIAGNOSIS — Z79899 Other long term (current) drug therapy: Secondary | ICD-10-CM | POA: Diagnosis not present

## 2015-10-11 DIAGNOSIS — G2111 Neuroleptic induced parkinsonism: Secondary | ICD-10-CM | POA: Diagnosis not present

## 2015-10-11 DIAGNOSIS — I10 Essential (primary) hypertension: Secondary | ICD-10-CM | POA: Diagnosis not present

## 2015-10-11 DIAGNOSIS — E119 Type 2 diabetes mellitus without complications: Secondary | ICD-10-CM | POA: Diagnosis not present

## 2015-10-11 DIAGNOSIS — M533 Sacrococcygeal disorders, not elsewhere classified: Secondary | ICD-10-CM | POA: Diagnosis not present

## 2015-10-18 ENCOUNTER — Emergency Department (HOSPITAL_COMMUNITY): Payer: PPO

## 2015-10-18 ENCOUNTER — Emergency Department (HOSPITAL_COMMUNITY)
Admission: EM | Admit: 2015-10-18 | Discharge: 2015-10-18 | Disposition: A | Discharge status: Home / Self Care | Payer: PPO | Attending: Emergency Medicine | Admitting: Emergency Medicine

## 2015-10-18 DIAGNOSIS — Z7902 Long term (current) use of antithrombotics/antiplatelets: Secondary | ICD-10-CM | POA: Diagnosis not present

## 2015-10-18 DIAGNOSIS — Z87891 Personal history of nicotine dependence: Secondary | ICD-10-CM | POA: Diagnosis not present

## 2015-10-18 DIAGNOSIS — I509 Heart failure, unspecified: Secondary | ICD-10-CM | POA: Insufficient documentation

## 2015-10-18 DIAGNOSIS — J441 Chronic obstructive pulmonary disease with (acute) exacerbation: Secondary | ICD-10-CM | POA: Insufficient documentation

## 2015-10-18 DIAGNOSIS — E119 Type 2 diabetes mellitus without complications: Secondary | ICD-10-CM | POA: Insufficient documentation

## 2015-10-18 DIAGNOSIS — R0682 Tachypnea, not elsewhere classified: Secondary | ICD-10-CM | POA: Diagnosis not present

## 2015-10-18 DIAGNOSIS — E7800 Pure hypercholesterolemia, unspecified: Secondary | ICD-10-CM | POA: Diagnosis not present

## 2015-10-18 DIAGNOSIS — G4700 Insomnia, unspecified: Secondary | ICD-10-CM | POA: Insufficient documentation

## 2015-10-18 DIAGNOSIS — Z794 Long term (current) use of insulin: Secondary | ICD-10-CM | POA: Diagnosis not present

## 2015-10-18 DIAGNOSIS — Z79899 Other long term (current) drug therapy: Secondary | ICD-10-CM | POA: Diagnosis not present

## 2015-10-18 DIAGNOSIS — G8929 Other chronic pain: Secondary | ICD-10-CM | POA: Insufficient documentation

## 2015-10-18 DIAGNOSIS — Z7982 Long term (current) use of aspirin: Secondary | ICD-10-CM | POA: Insufficient documentation

## 2015-10-18 DIAGNOSIS — R4182 Altered mental status, unspecified: Secondary | ICD-10-CM | POA: Diagnosis not present

## 2015-10-18 DIAGNOSIS — F329 Major depressive disorder, single episode, unspecified: Secondary | ICD-10-CM | POA: Diagnosis not present

## 2015-10-18 DIAGNOSIS — Z9981 Dependence on supplemental oxygen: Secondary | ICD-10-CM | POA: Insufficient documentation

## 2015-10-18 DIAGNOSIS — R0602 Shortness of breath: Secondary | ICD-10-CM | POA: Diagnosis not present

## 2015-10-18 DIAGNOSIS — R0902 Hypoxemia: Secondary | ICD-10-CM | POA: Diagnosis not present

## 2015-10-18 DIAGNOSIS — H5441 Blindness, right eye, normal vision left eye: Secondary | ICD-10-CM | POA: Insufficient documentation

## 2015-10-18 LAB — COMPREHENSIVE METABOLIC PANEL
ALK PHOS: 69 U/L (ref 38–126)
ALT: 11 U/L — AB (ref 17–63)
AST: 15 U/L (ref 15–41)
Albumin: 3.8 g/dL (ref 3.5–5.0)
Anion gap: 11 (ref 5–15)
BILIRUBIN TOTAL: 0.8 mg/dL (ref 0.3–1.2)
BUN: 6 mg/dL (ref 6–20)
CALCIUM: 9.2 mg/dL (ref 8.9–10.3)
CO2: 32 mmol/L (ref 22–32)
CREATININE: 0.84 mg/dL (ref 0.61–1.24)
Chloride: 88 mmol/L — ABNORMAL LOW (ref 101–111)
Glucose, Bld: 73 mg/dL (ref 65–99)
Potassium: 4 mmol/L (ref 3.5–5.1)
Sodium: 131 mmol/L — ABNORMAL LOW (ref 135–145)
Total Protein: 6.8 g/dL (ref 6.5–8.1)

## 2015-10-18 LAB — BLOOD GAS, ARTERIAL
ACID-BASE EXCESS: 7.8 mmol/L — AB (ref 0.0–2.0)
BICARBONATE: 30.9 meq/L — AB (ref 20.0–24.0)
Drawn by: 317771
O2 Saturation: 94.8 %
PCO2 ART: 49.8 mmHg — AB (ref 35.0–45.0)
PO2 ART: 74.3 mmHg — AB (ref 80.0–100.0)
TCO2: 16.3 mmol/L (ref 0–100)
pH, Arterial: 7.428 (ref 7.350–7.450)

## 2015-10-18 LAB — CBC WITH DIFFERENTIAL/PLATELET
BASOS ABS: 0 10*3/uL (ref 0.0–0.1)
Basophils Relative: 0 %
EOS PCT: 1 %
Eosinophils Absolute: 0.1 10*3/uL (ref 0.0–0.7)
HCT: 42.2 % (ref 39.0–52.0)
Hemoglobin: 13.2 g/dL (ref 13.0–17.0)
LYMPHS PCT: 11 %
Lymphs Abs: 1.5 10*3/uL (ref 0.7–4.0)
MCH: 27.4 pg (ref 26.0–34.0)
MCHC: 31.3 g/dL (ref 30.0–36.0)
MCV: 87.6 fL (ref 78.0–100.0)
MONO ABS: 0.8 10*3/uL (ref 0.1–1.0)
Monocytes Relative: 6 %
Neutro Abs: 11 10*3/uL — ABNORMAL HIGH (ref 1.7–7.7)
Neutrophils Relative %: 82 %
PLATELETS: 219 10*3/uL (ref 150–400)
RBC: 4.82 MIL/uL (ref 4.22–5.81)
RDW: 12.9 % (ref 11.5–15.5)
WBC: 13.4 10*3/uL — ABNORMAL HIGH (ref 4.0–10.5)

## 2015-10-18 LAB — CBG MONITORING, ED
GLUCOSE-CAPILLARY: 160 mg/dL — AB (ref 65–99)
Glucose-Capillary: 66 mg/dL (ref 65–99)
Glucose-Capillary: 167 mg/dL — ABNORMAL HIGH (ref 65–99)

## 2015-10-18 LAB — TROPONIN I

## 2015-10-18 LAB — RAPID URINE DRUG SCREEN, HOSP PERFORMED
Amphetamines: NOT DETECTED
BARBITURATES: NOT DETECTED
Benzodiazepines: NOT DETECTED
Cocaine: NOT DETECTED
Opiates: POSITIVE — AB
Tetrahydrocannabinol: NOT DETECTED

## 2015-10-18 LAB — URINALYSIS, ROUTINE W REFLEX MICROSCOPIC
BILIRUBIN URINE: NEGATIVE
GLUCOSE, UA: NEGATIVE mg/dL
HGB URINE DIPSTICK: NEGATIVE
Leukocytes, UA: NEGATIVE
NITRITE: NEGATIVE
PH: 7 (ref 5.0–8.0)
Specific Gravity, Urine: 1.01 (ref 1.005–1.030)

## 2015-10-18 LAB — BRAIN NATRIURETIC PEPTIDE: B NATRIURETIC PEPTIDE 5: 38 pg/mL (ref 0.0–100.0)

## 2015-10-18 LAB — URINE MICROSCOPIC-ADD ON
BACTERIA UA: NONE SEEN
Squamous Epithelial / LPF: NONE SEEN
WBC, UA: NONE SEEN WBC/hpf (ref 0–5)

## 2015-10-18 MED ORDER — DEXTROSE 50 % IV SOLN
1.0000 | Freq: Once | INTRAVENOUS | Status: AC
  Administered 2015-10-18: 50 mL via INTRAVENOUS

## 2015-10-18 MED ORDER — SODIUM CHLORIDE 0.9 % IV BOLUS (SEPSIS): 1000.0000 mL | Freq: Once | INTRAVENOUS | Status: DC

## 2015-10-18 MED ORDER — DEXTROSE 10 % IV SOLN
INTRAVENOUS | Status: DC
  Administered 2015-10-18: 03:00:00 via INTRAVENOUS

## 2015-10-18 MED ORDER — DEXTROSE 50 % IV SOLN
INTRAVENOUS | Status: AC
  Filled 2015-10-18: qty 50

## 2015-10-18 MED ORDER — ACETAMINOPHEN 500 MG PO TABS
1000.0000 mg | ORAL_TABLET | Freq: Once | ORAL | Status: AC
  Administered 2015-10-18: 1000 mg via ORAL
  Filled 2015-10-18: qty 2

## 2015-10-18 NOTE — ED Notes (Signed)
Pt c/o increased sob over last couple of days. Pt does not use cpap at home

## 2015-10-18 NOTE — ED Provider Notes (Signed)
CSN: [id number]     Arrival date & time 10/18/15  0142 History   First MD Initiated Contact with Patient 10/18/15 0148     Chief Complaint  Patient presents with  . Shortness of Breath   Level V caveat for altered mental status  (Consider location/radiation/quality/duration/timing/severity/associated sxs/prior Treatment) HPI  patient presents via EMS for worsening shortness of breath. Per EMS patient seemed confused about using his insulin pump and that's when his wife realized something was wrong and called EMS. Patient here is sleeping and hard to awaken. When he is awakened he states he has been getting short of breath. And then he falls back asleep.  PCP ? Dr [name]  Past Medical History  Diagnosis Date  . COPD (chronic obstructive pulmonary disease) ([place])   . CHF (congestive heart failure) ([facility])     diastolic   . Hypercholesterolemia   . Depression   . Schizophrenia ([place])   . Sleep apnea     noncompliant with BiPAP  . Tobacco abuse   . On home O2     3 liters  . Chronic pain   . Diabetes mellitus without complication ([facility])     insulin pump  . 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 Knee surgery      X2  . 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   . Colonoscopy  July 2011    Dr. Benson Norway: multiple polyps, internal and external hemorrhoids, diverticulosis. tubular adenoma  . Esophagogastroduodenoscopy (egd) with propofol N/A 04/20/2013    Procedure: ESOPHAGOGASTRODUODENOSCOPY (EGD) WITH PROPOFOL;  Surgeon: Daneil Dolin, MD;  Location: [facility];  Service: Endoscopy;  Laterality: N/A;   Family History  Problem Relation Age of Onset  . Colon cancer Neg Hx   . Parkinson's disease    . Thyroid disease Mother   . Parkinson's disease Father    Social History  Substance Use Topics  . Smoking status: Former Smoker -- 1.00 packs/day for 50 years     Types: Cigarettes    Quit date: 09/16/2013  . Smokeless tobacco: Never Used  . Alcohol Use: No     Comment: history of ETOH abuse in remote past, none in at least 10 years  lives at home Lives with spouse  Review of Systems  Unable to perform ROS: Mental status change      Allergies  Phenergan; Haldol; and Metformin and related  Home Medications   Prior to Admission medications   Medication Sig Start Date End Date Taking? Authorizing Provider  albuterol (PROVENTIL HFA;VENTOLIN HFA) 108 (90 BASE) MCG/ACT inhaler Inhale 2 puffs into the lungs every 6 (six) hours as needed for wheezing or shortness of breath. 05/11/14   Radene Gunning, NP  albuterol (PROVENTIL) (2.5 MG/3ML) 0.083% nebulizer solution Take 3 mLs (2.5 mg total) by nebulization every 6 (six) hours as needed for wheezing or shortness of breath. 08/21/15   Kathie Dike, MD  amoxicillin-clavulanate (AUGMENTIN) 875-125 MG tablet Take 1 tablet by mouth every 12 (twelve) hours. 08/21/15   Kathie Dike, MD  aspirin 81 MG chewable tablet Chew 81 mg by mouth every morning.     Historical Provider, MD  cholecalciferol (VITAMIN D) 1000 UNITS tablet Take 2,000 Units by mouth daily.    Historical Provider, MD  Cinnamon 500 MG capsule Take 1,000 mg by mouth every  morning.     Historical Provider, MD  citalopram (CELEXA) 20 MG tablet Take 20 mg by mouth every morning.    Historical Provider, MD  clopidogrel (PLAVIX) 75 MG tablet Take 75 mg by mouth every morning.    Historical Provider, MD  furosemide (LASIX) 40 MG tablet Take 1 tablet (40 mg total) by mouth daily. 08/10/14   Kathie Dike, MD  gabapentin (NEURONTIN) 300 MG capsule Take 300 mg by mouth 4 (four) times daily.     Historical Provider, MD  HYDROcodone-acetaminophen (NORCO/VICODIN) 5-325 MG per tablet Take 1 tablet by mouth every 4 (four) hours as needed for moderate pain.     Historical Provider, MD  Insulin Human (INSULIN PUMP) SOLN Inject into the skin continuous.  Humalog    Historical Provider, MD  metoprolol (LOPRESSOR) 50 MG tablet Take 50 mg by mouth 2 (two) times daily.    Historical Provider, MD  pantoprazole (PROTONIX) 40 MG tablet Take 40 mg by mouth 2 (two) times daily.    Historical Provider, MD  predniSONE (DELTASONE) 10 MG tablet Take 40mg  po daily for 2 days then 30mg  daily for 2 days then 20mg  daily for 2 days then 10mg  daily for 2 days then stop 08/21/15   Kathie Dike, MD  simvastatin (ZOCOR) 40 MG tablet Take 40 mg by mouth every evening.    Historical Provider, MD  sodium chloride 1 G tablet Take 1 g by mouth 2 (two) times daily.    Historical Provider, MD  terazosin (HYTRIN) 1 MG capsule Take 1 mg by mouth 2 (two) times daily.     Historical Provider, MD  Tetrahydrozoline HCl (VISINE OP) Apply 1 drop to eye daily as needed (irritation).     Historical Provider, MD  tiotropium (SPIRIVA HANDIHALER) 18 MCG inhalation capsule Place 1 capsule (18 mcg total) into inhaler and inhale every morning. 05/11/14   Radene Gunning, NP  traZODone (DESYREL) 150 MG tablet Take 300 mg by mouth at bedtime.     Historical Provider, MD  triamcinolone cream (KENALOG) 0.1 % Apply 1 application topically 2 (two) times daily as needed (for irritation).     Historical Provider, MD  ziprasidone (GEODON) 80 MG capsule Take 80 mg by mouth every evening.    Historical Provider, MD   BP 136/83 mmHg  Pulse 104  Temp(Src) 98.2 F (36.8 C) (Oral)  Resp 18  Ht 6' (1.829 m)  Wt 297 lb (134.718 kg)  BMI 40.27 kg/m2  SpO2 95%  Vital signs normal except for tachycardia  Physical Exam  Constitutional: He is oriented to person, place, and time. He appears well-developed and well-nourished.  Non-toxic appearance. He does not appear ill. No distress.  Obese   HENT:  Head: Normocephalic and atraumatic.  Right Ear: External ear normal.  Left Ear: External ear normal.  Nose: Nose normal. No mucosal edema or rhinorrhea.  Mouth/Throat: Oropharynx is clear and moist and  mucous membranes are normal. No dental abscesses or uvula swelling.  Eyes: Conjunctivae and EOM are normal. Pupils are equal, round, and reactive to light.  Neck: Normal range of motion and full passive range of motion without pain. Neck supple.  Cardiovascular: Normal rate, regular rhythm and normal heart sounds.  Exam reveals no gallop and no friction rub.   No murmur heard. Pulmonary/Chest: Effort normal and breath sounds normal. No respiratory distress. He has no wheezes. He has no rhonchi. He has no rales. He exhibits no tenderness and no crepitus.  Abdominal: Soft. Normal  appearance and bowel sounds are normal. He exhibits no distension. There is no tenderness. There is no rebound and no guarding.  Musculoskeletal: Normal range of motion. He exhibits no edema or tenderness.  Moves all extremities well.   Neurological: He is alert and oriented to person, place, and time. He has normal strength. No cranial nerve deficit.  Patient is hard to awaken however when he is awake briefly he states he's at [facility].  Skin: Skin is warm, dry and intact. No rash noted. No erythema. No pallor.  Psychiatric: He has a normal mood and affect. His speech is normal and behavior is normal. His mood appears not anxious.  Nursing note and vitals reviewed.   ED Course  Procedures (including critical care time) Medications  dextrose 10 % infusion ( Intravenous Stopped 10/18/15 0419)  sodium chloride 0.9 % bolus 1,000 mL (not administered)  dextrose 50 % solution 50 mL (50 mLs Intravenous Given 10/18/15 0221)  acetaminophen (TYLENOL) tablet 1,000 mg (1,000 mg Oral Given 10/18/15 0351)   EMS reports patient CBG was in the 80s, in the ED it was in the 60s. He was given at the [facility] and started on a drip of D10.  Review of patient's last admission which was in November he was admitted with altered mental status and was felt to have had a benzodiazepine overdose and hypercapneic Respiratory failure.  02:45 wife is  here. She states he hasn't felt well for 2 days however she states he never feels well since he went on disability when he was 4. She states he did not eat today. She states he has been having his teeth pulled and he's having a lot of pain from that. Yesterday he thought maybe he was constipated and took laxatives and now is having diarrhea. She also reports he drank 6-7 cans of Coke 0 today. He has had hyponatremia before with AMS. She states he didn't sleep last night or at all today meaning January 30. She states he supposed to be on BiPAP at night however he's not used it in at least 6 weeks. She states that he is in control of taking his own medications. She states tonight he awakened her from sleep and wanted her to call the EMS She states he c/o being short of breath. Patient is resting comfortably, he does not appear to be in respiratory distress. His pulse ox is 98% on nasal cannula oxygen.  CBG at 2:58 AM was 160 on the D10 drip.  3:40 AM patient seems more awake, he states "I don't feel good". He tells me he has a headache and indicates the front part of his head. Patient was given Tylenol.  4:10 AM patient is much more awake now. Now is complaining about his gums hurting from where he had his teeth extracted. However patient is also requesting coffee to drink. He was given a liter normal saline for his hyponatremia. His CBG has been stabilized on the D10 drip. His CBG is 167.His wife states they know his insulin pump is not functioning properly however she has an insulin pen to use. He actually had a doctor's appointment on January 30th to get his insulin pump fixed but missed the appt because he didn't feel well enough to go. At this point patient is felt stable to be discharged home. He also states is time for him to get his hydrocodone.I wonder if he actually got his hydrocodone filled in the past couple of days and took extra, his  AMS is resolving with time.   Patient receives #180  hydrocodone 5/325 monthly, he had it last filled 12/26, prescribed by Dr. Mellody Dance Review Results for orders placed or performed during the hospital encounter of 10/18/15  Blood gas, arterial ([facility] & [facility] ONLY)  Result Value Ref Range   pH, Arterial 7.428 7.350 - 7.450   pCO2 arterial 49.8 (H) 35.0 - 45.0 mmHg   pO2, Arterial 74.3 (L) 80.0 - 100.0 mmHg   Bicarbonate 30.9 (H) 20.0 - 24.0 mEq/L   TCO2 16.3 0 - 100 mmol/L   Acid-Base Excess 7.8 (H) 0.0 - 2.0 mmol/L   O2 Saturation 94.8 %   Collection site RIGHT RADIAL    Drawn by [id number]    Sample type ARTERIAL DRAW    Allens test (pass/fail) PASS PASS  Comprehensive metabolic panel  Result Value Ref Range   Sodium 131 (L) 135 - 145 mmol/L   Potassium 4.0 3.5 - 5.1 mmol/L   Chloride 88 (L) 101 - 111 mmol/L   CO2 32 22 - 32 mmol/L   Glucose, Bld 73 65 - 99 mg/dL   BUN 6 6 - 20 mg/dL   Creatinine, Ser 0.84 0.61 - 1.24 mg/dL   Calcium 9.2 8.9 - 10.3 mg/dL   Total Protein 6.8 6.5 - 8.1 g/dL   Albumin 3.8 3.5 - 5.0 g/dL   AST 15 15 - 41 U/L   ALT 11 (L) 17 - 63 U/L   Alkaline Phosphatase 69 38 - 126 U/L   Total Bilirubin 0.8 0.3 - 1.2 mg/dL   GFR calc non Af Amer >60 >60 mL/min   GFR calc Af Amer >60 >60 mL/min   Anion gap 11 5 - 15  Brain natriuretic peptide  Result Value Ref Range   B Natriuretic Peptide 38.0 0.0 - 100.0 pg/mL  Troponin I  Result Value Ref Range   Troponin I <0.03 <0.031 ng/mL  CBC with Differential  Result Value Ref Range   WBC 13.4 (H) 4.0 - 10.5 K/uL   RBC 4.82 4.22 - 5.81 MIL/uL   Hemoglobin 13.2 13.0 - 17.0 g/dL   HCT 42.2 39.0 - 52.0 %   MCV 87.6 78.0 - 100.0 fL   MCH 27.4 26.0 - 34.0 pg   MCHC 31.3 30.0 - 36.0 g/dL   RDW 12.9 11.5 - 15.5 %   Platelets 219 150 - 400 K/uL   Neutrophils Relative % 82 %   Neutro Abs 11.0 (H) 1.7 - 7.7 K/uL   Lymphocytes Relative 11 %   Lymphs Abs 1.5 0.7 - 4.0 K/uL   Monocytes Relative 6 %   Monocytes Absolute 0.8 0.1 - 1.0 K/uL   Eosinophils Relative 1 %    Eosinophils Absolute 0.1 0.0 - 0.7 K/uL   Basophils Relative 0 %   Basophils Absolute 0.0 0.0 - 0.1 K/uL  CBG monitoring, ED  Result Value Ref Range   Glucose-Capillary 66 65 - 99 mg/dL  CBG monitoring, ED  Result Value Ref Range   Glucose-Capillary 160 (H) 65 - 99 mg/dL   Comment 1 Notify RN    Comment 2 Document in Chart   CBG monitoring, ED  Result Value Ref Range   Glucose-Capillary 167 (H) 65 - 99 mg/dL   Comment 1 Notify RN    Comment 2 Document in Chart    Laboratory interpretation all normal except for leukocytosis, compensated respiratory acidosis on ABG, minor hyponatremia     Imaging Review Dg Chest Port 1  View  10/18/2015  CLINICAL DATA:  Altered mental status and shortness of breath EXAM: PORTABLE CHEST 1 VIEW COMPARISON:  08/20/2015 FINDINGS: Limited by patient's chin overlapping the right more than left apex. Chronic cardiomegaly and vascular pedicle widening. Chronic hyperinflation correlating with COPD. There is no edema, consolidation, effusion, or pneumothorax. IMPRESSION: 1. No acute finding. 2. COPD and cardiomegaly. Electronically Signed   By: Monte Fantasia M.D.   On: 10/18/2015 02:07   I have personally reviewed and evaluated these images and lab results as part of my medical decision-making.   EKG Interpretation   Date/Time:  Tuesday October 18 2015 01:58:02 EST Ventricular Rate:  104 PR Interval:  172 QRS Duration: 137 QT Interval:  372 QTC Calculation: 489 R Axis:   23 Text Interpretation:  Sinus tachycardia Nonspecific intraventricular  conduction delay Borderline repolarization abnormality Since last tracing  rate slower 16 Aug 2015 Confirmed by [facility]  MD-I, Adiana Smelcer ([id number]) on 10/18/2015  2:01:16 AM      MDM   Final diagnoses:  Altered mental status, unspecified altered mental status type    Plan discharge  Rolland Porter, MD, Barbette Or, MD 10/18/15 [phone]

## 2015-10-18 NOTE — ED Notes (Signed)
Pt placed on bedpan and removed; pt stated he needed to have BM, but no results, just flatulence

## 2015-10-18 NOTE — Discharge Instructions (Signed)
Monitor your blood sugars closely. Try to get your insulin pump fixed soon. Recheck for any new acute problems.

## 2015-10-19 DIAGNOSIS — G4733 Obstructive sleep apnea (adult) (pediatric): Secondary | ICD-10-CM | POA: Diagnosis not present

## 2015-10-19 DIAGNOSIS — S82843A Displaced bimalleolar fracture of unspecified lower leg, initial encounter for closed fracture: Secondary | ICD-10-CM | POA: Diagnosis not present

## 2015-10-19 DIAGNOSIS — J449 Chronic obstructive pulmonary disease, unspecified: Secondary | ICD-10-CM | POA: Diagnosis not present

## 2015-10-31 IMAGING — DX DG ANKLE 2V *R*
2 series · 3 of 3 positions shown · non-contrast
Comparison: Right tibia and fibula films of 12/23/2014 and
12/05/2014

CLINICAL DATA: Ankle pain, fractured 3 months ago

EXAM:
RIGHT ANKLE - 2 VIEW

[ankle ap]
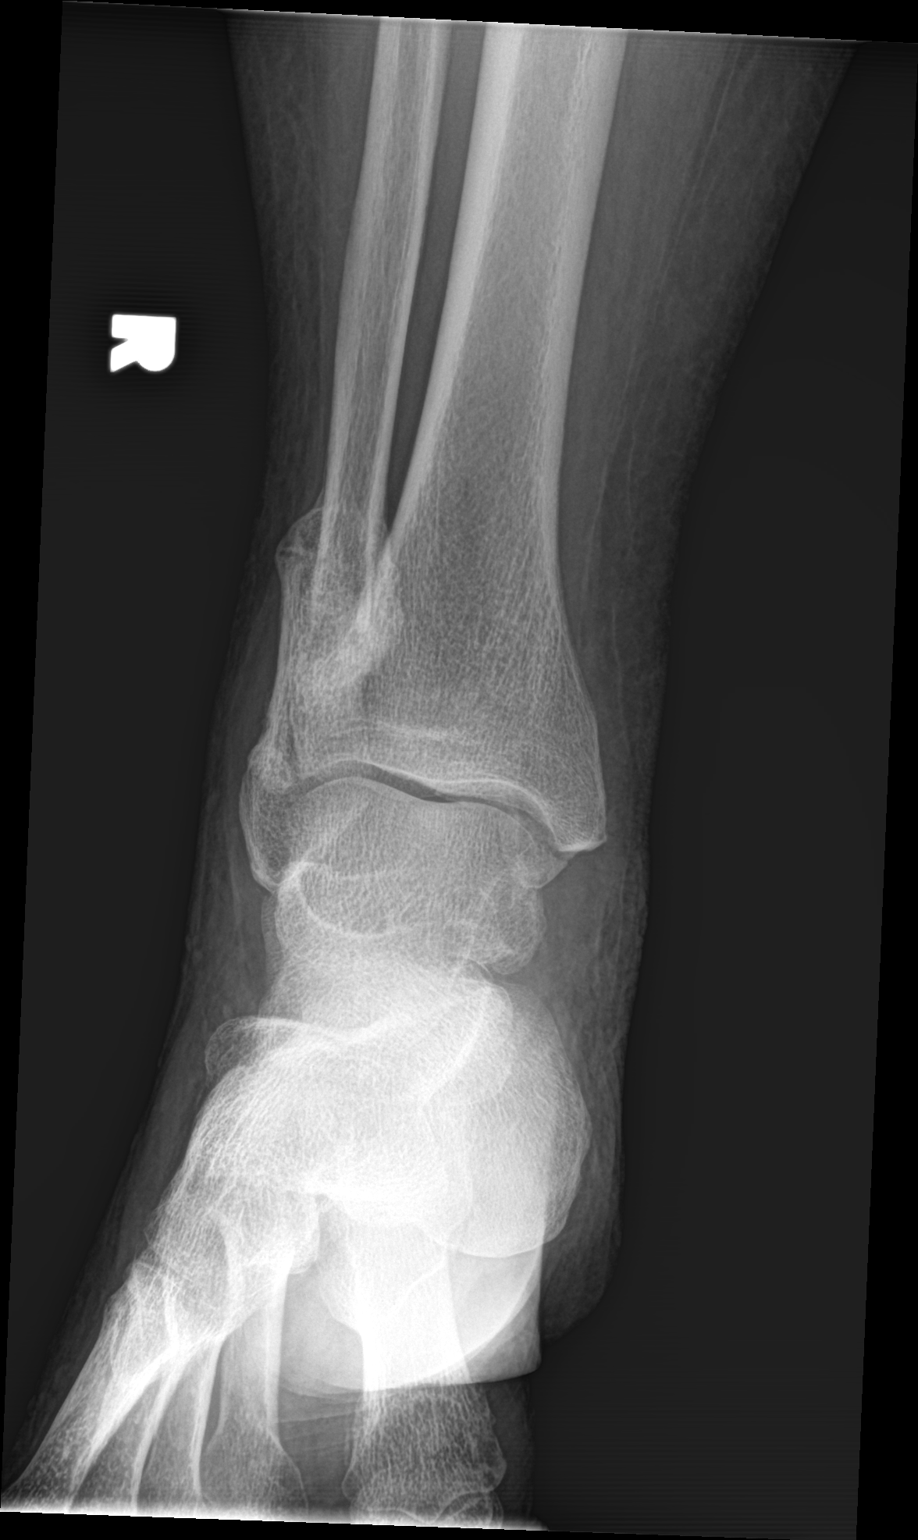

[Series 2: ankle lat · 0.14mm/px · 2 of 2 slices shown]
[im 1/2]
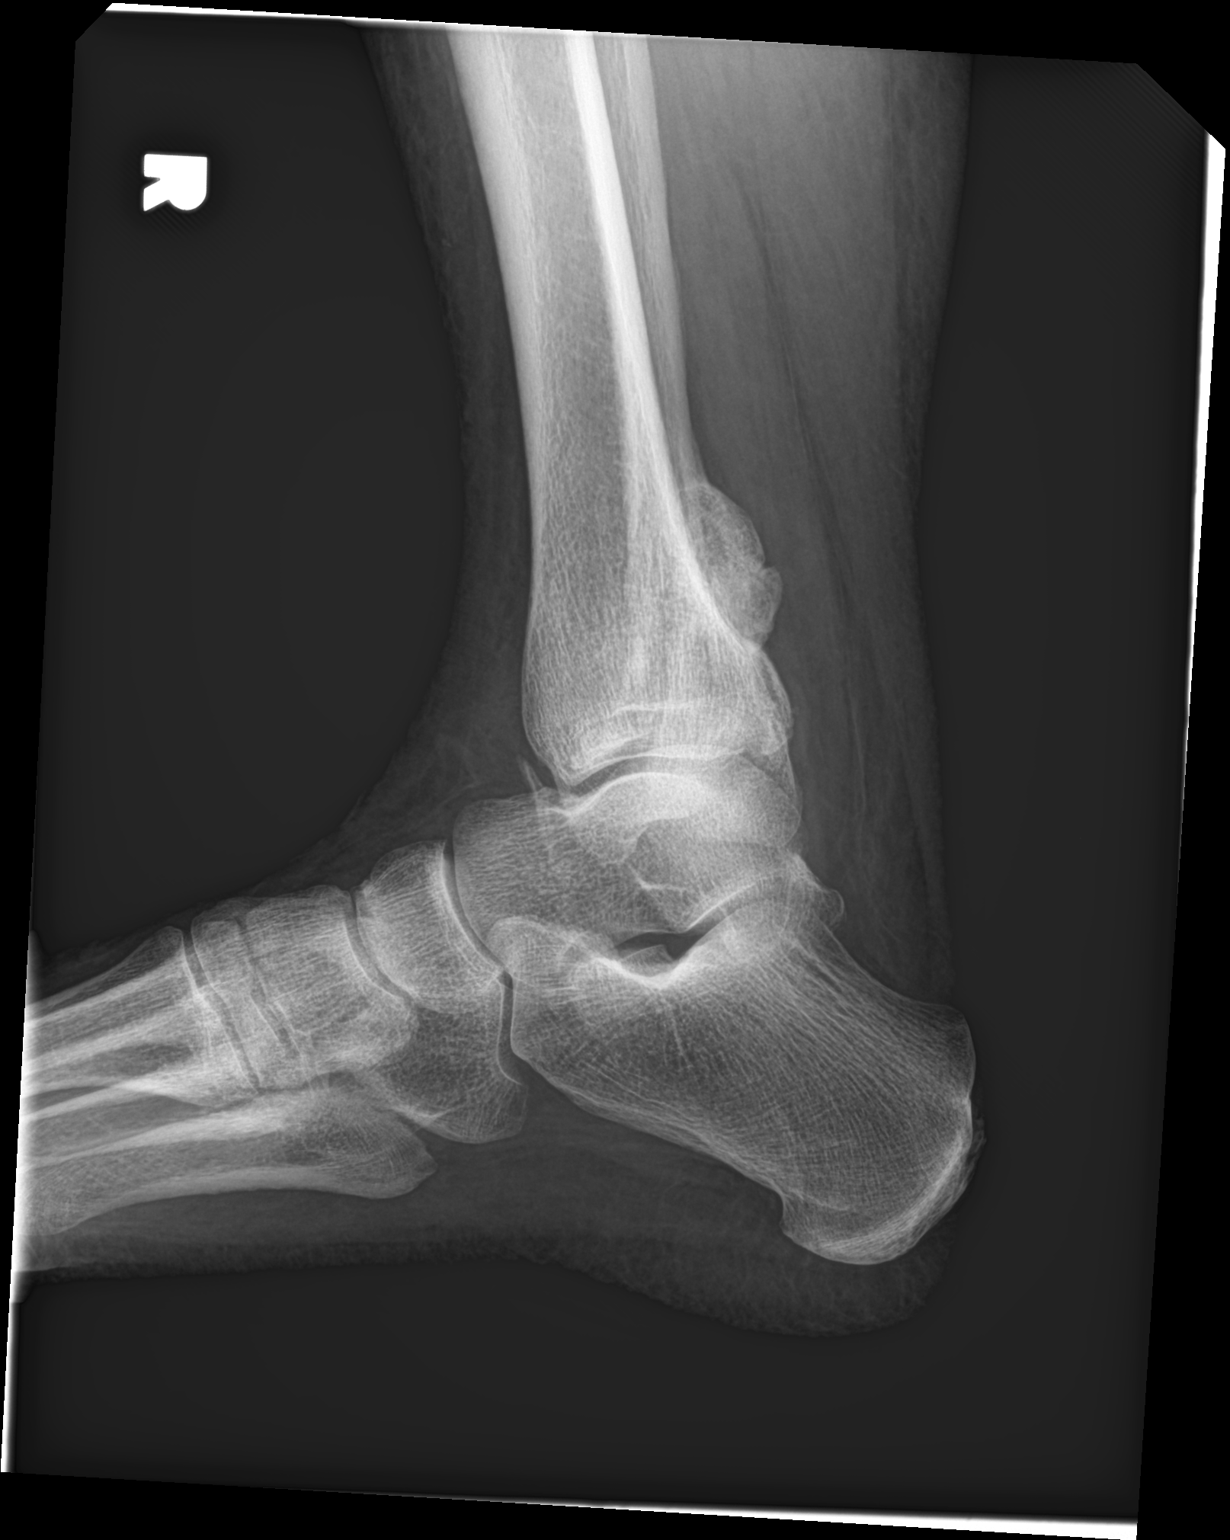
[im 2/2]
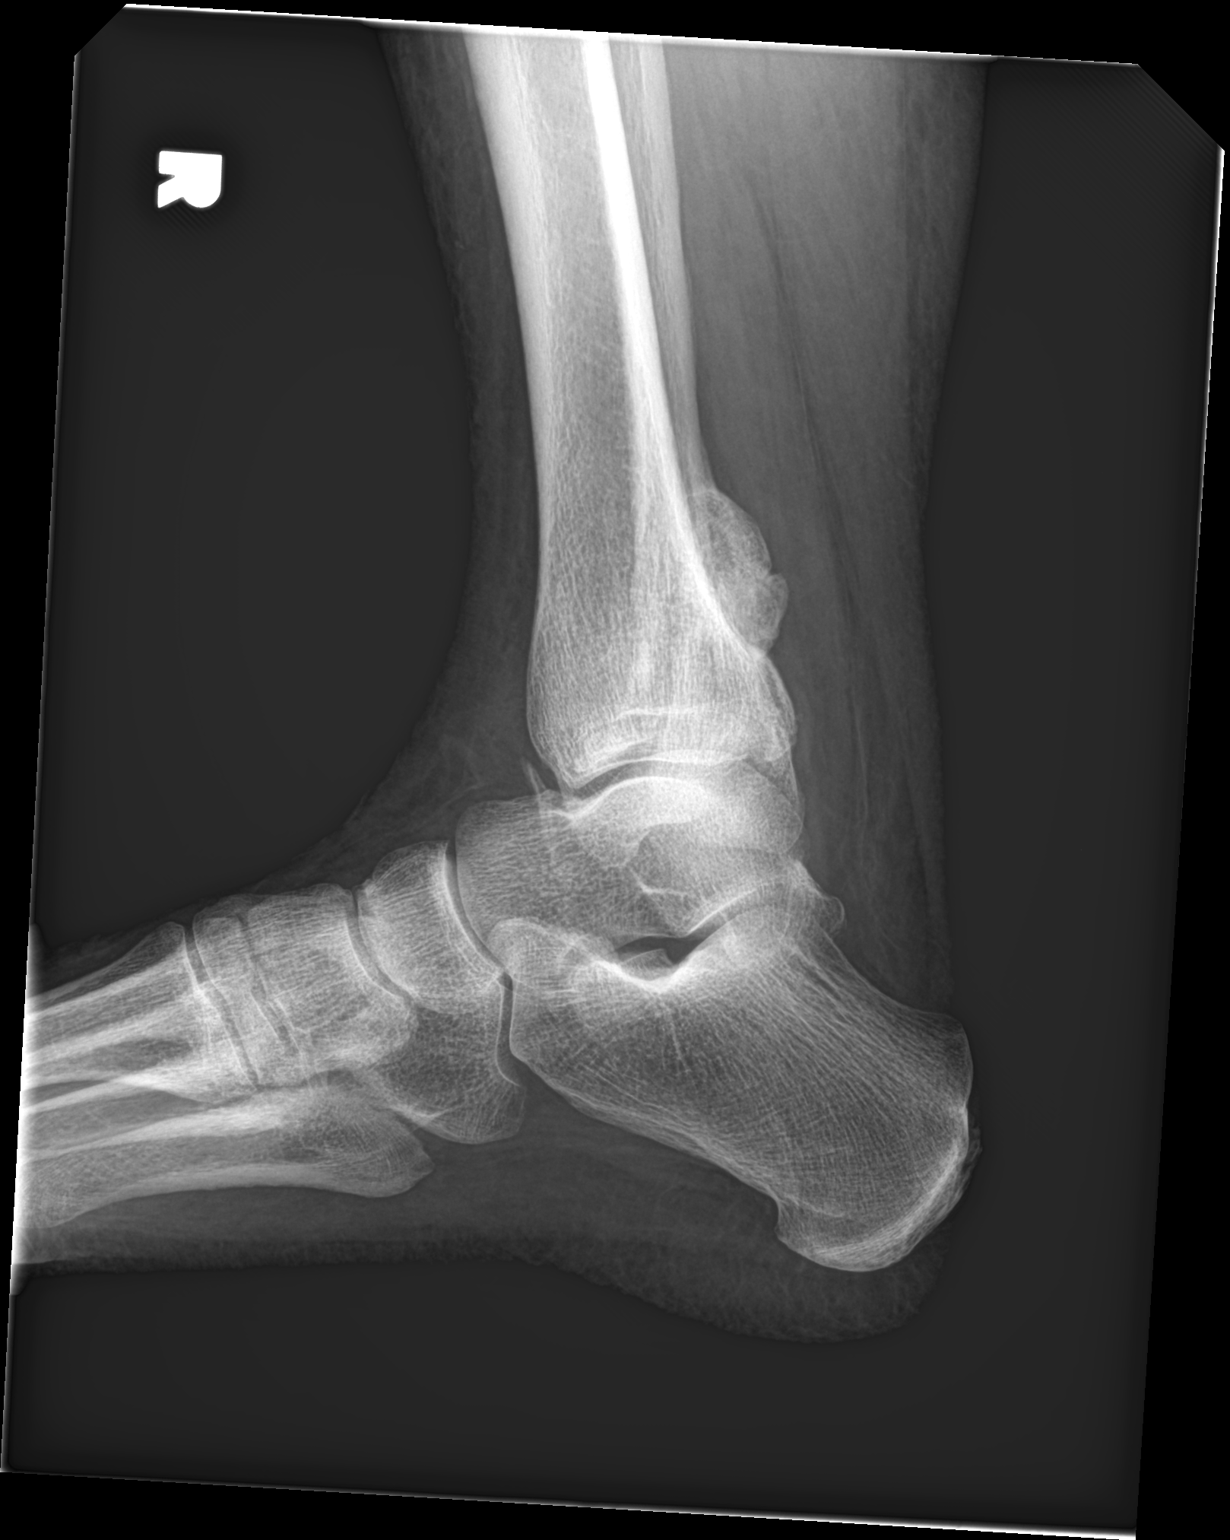

[3 of 3 positions shown; findings below may reference images not displayed]

FINDINGS: There is more callus around the healed distal right fibular
fracture. The medial malleolar fracture is not evident. The ankle
joint is unremarkable. No acute abnormality is seen. The small thin
bony density anterior to the distal right tibia on the lateral view
is unchanged.
IMPRESSION: More callus around the healed distal right fibular fracture. No
acute abnormality.

## 2015-11-14 DIAGNOSIS — E119 Type 2 diabetes mellitus without complications: Secondary | ICD-10-CM | POA: Diagnosis not present

## 2015-11-15 DIAGNOSIS — E1165 Type 2 diabetes mellitus with hyperglycemia: Secondary | ICD-10-CM | POA: Diagnosis not present

## 2015-11-16 DIAGNOSIS — J449 Chronic obstructive pulmonary disease, unspecified: Secondary | ICD-10-CM | POA: Diagnosis not present

## 2015-11-16 DIAGNOSIS — G4733 Obstructive sleep apnea (adult) (pediatric): Secondary | ICD-10-CM | POA: Diagnosis not present

## 2015-11-16 DIAGNOSIS — E119 Type 2 diabetes mellitus without complications: Secondary | ICD-10-CM | POA: Diagnosis not present

## 2015-11-16 DIAGNOSIS — M5440 Lumbago with sciatica, unspecified side: Secondary | ICD-10-CM | POA: Diagnosis not present

## 2015-11-16 DIAGNOSIS — S82843A Displaced bimalleolar fracture of unspecified lower leg, initial encounter for closed fracture: Secondary | ICD-10-CM | POA: Diagnosis not present

## 2015-11-16 DIAGNOSIS — E7800 Pure hypercholesterolemia, unspecified: Secondary | ICD-10-CM | POA: Diagnosis not present

## 2015-11-16 DIAGNOSIS — I10 Essential (primary) hypertension: Secondary | ICD-10-CM | POA: Diagnosis not present

## 2015-11-28 DIAGNOSIS — G4733 Obstructive sleep apnea (adult) (pediatric): Secondary | ICD-10-CM | POA: Diagnosis not present

## 2015-11-28 DIAGNOSIS — J449 Chronic obstructive pulmonary disease, unspecified: Secondary | ICD-10-CM | POA: Diagnosis not present

## 2015-11-28 DIAGNOSIS — S82843A Displaced bimalleolar fracture of unspecified lower leg, initial encounter for closed fracture: Secondary | ICD-10-CM | POA: Diagnosis not present

## 2015-12-13 DIAGNOSIS — M545 Low back pain: Secondary | ICD-10-CM | POA: Diagnosis not present

## 2015-12-13 DIAGNOSIS — M1712 Unilateral primary osteoarthritis, left knee: Secondary | ICD-10-CM | POA: Diagnosis not present

## 2015-12-13 DIAGNOSIS — M1711 Unilateral primary osteoarthritis, right knee: Secondary | ICD-10-CM | POA: Diagnosis not present

## 2015-12-13 DIAGNOSIS — M1710 Unilateral primary osteoarthritis, unspecified knee: Secondary | ICD-10-CM | POA: Diagnosis not present

## 2015-12-17 DIAGNOSIS — J449 Chronic obstructive pulmonary disease, unspecified: Secondary | ICD-10-CM | POA: Diagnosis not present

## 2015-12-17 DIAGNOSIS — G4733 Obstructive sleep apnea (adult) (pediatric): Secondary | ICD-10-CM | POA: Diagnosis not present

## 2015-12-17 DIAGNOSIS — S82843A Displaced bimalleolar fracture of unspecified lower leg, initial encounter for closed fracture: Secondary | ICD-10-CM | POA: Diagnosis not present

## 2015-12-19 DIAGNOSIS — I10 Essential (primary) hypertension: Secondary | ICD-10-CM | POA: Diagnosis not present

## 2015-12-19 DIAGNOSIS — E7800 Pure hypercholesterolemia, unspecified: Secondary | ICD-10-CM | POA: Diagnosis not present

## 2015-12-19 DIAGNOSIS — M549 Dorsalgia, unspecified: Secondary | ICD-10-CM | POA: Diagnosis not present

## 2015-12-19 DIAGNOSIS — E119 Type 2 diabetes mellitus without complications: Secondary | ICD-10-CM | POA: Diagnosis not present

## 2015-12-30 DIAGNOSIS — E1142 Type 2 diabetes mellitus with diabetic polyneuropathy: Secondary | ICD-10-CM | POA: Diagnosis not present

## 2015-12-30 DIAGNOSIS — L603 Nail dystrophy: Secondary | ICD-10-CM | POA: Diagnosis not present

## 2016-01-12 DIAGNOSIS — E119 Type 2 diabetes mellitus without complications: Secondary | ICD-10-CM | POA: Diagnosis not present

## 2016-01-16 DIAGNOSIS — G4733 Obstructive sleep apnea (adult) (pediatric): Secondary | ICD-10-CM | POA: Diagnosis not present

## 2016-01-16 DIAGNOSIS — J449 Chronic obstructive pulmonary disease, unspecified: Secondary | ICD-10-CM | POA: Diagnosis not present

## 2016-01-16 DIAGNOSIS — S82843A Displaced bimalleolar fracture of unspecified lower leg, initial encounter for closed fracture: Secondary | ICD-10-CM | POA: Diagnosis not present

## 2016-01-23 DIAGNOSIS — I10 Essential (primary) hypertension: Secondary | ICD-10-CM | POA: Diagnosis not present

## 2016-01-23 DIAGNOSIS — E119 Type 2 diabetes mellitus without complications: Secondary | ICD-10-CM | POA: Diagnosis not present

## 2016-02-08 DIAGNOSIS — I739 Peripheral vascular disease, unspecified: Secondary | ICD-10-CM | POA: Diagnosis not present

## 2016-02-08 DIAGNOSIS — E1142 Type 2 diabetes mellitus with diabetic polyneuropathy: Secondary | ICD-10-CM | POA: Diagnosis not present

## 2016-02-14 DIAGNOSIS — E1165 Type 2 diabetes mellitus with hyperglycemia: Secondary | ICD-10-CM | POA: Diagnosis not present

## 2016-02-15 ENCOUNTER — Ambulatory Visit (INDEPENDENT_AMBULATORY_CARE_PROVIDER_SITE_OTHER): Payer: Medicare Other | Admitting: Ophthalmology

## 2016-02-16 DIAGNOSIS — S82843A Displaced bimalleolar fracture of unspecified lower leg, initial encounter for closed fracture: Secondary | ICD-10-CM | POA: Diagnosis not present

## 2016-02-16 DIAGNOSIS — J449 Chronic obstructive pulmonary disease, unspecified: Secondary | ICD-10-CM | POA: Diagnosis not present

## 2016-02-16 DIAGNOSIS — G4733 Obstructive sleep apnea (adult) (pediatric): Secondary | ICD-10-CM | POA: Diagnosis not present

## 2016-02-20 DIAGNOSIS — R0600 Dyspnea, unspecified: Secondary | ICD-10-CM | POA: Diagnosis not present

## 2016-02-20 DIAGNOSIS — E119 Type 2 diabetes mellitus without complications: Secondary | ICD-10-CM | POA: Diagnosis not present

## 2016-02-20 DIAGNOSIS — J449 Chronic obstructive pulmonary disease, unspecified: Secondary | ICD-10-CM | POA: Diagnosis not present

## 2016-02-20 DIAGNOSIS — J441 Chronic obstructive pulmonary disease with (acute) exacerbation: Secondary | ICD-10-CM | POA: Diagnosis not present

## 2016-03-17 DIAGNOSIS — J449 Chronic obstructive pulmonary disease, unspecified: Secondary | ICD-10-CM | POA: Diagnosis not present

## 2016-03-17 DIAGNOSIS — G4733 Obstructive sleep apnea (adult) (pediatric): Secondary | ICD-10-CM | POA: Diagnosis not present

## 2016-03-17 DIAGNOSIS — S82843A Displaced bimalleolar fracture of unspecified lower leg, initial encounter for closed fracture: Secondary | ICD-10-CM | POA: Diagnosis not present

## 2016-04-17 DIAGNOSIS — G4733 Obstructive sleep apnea (adult) (pediatric): Secondary | ICD-10-CM | POA: Diagnosis not present

## 2016-04-17 DIAGNOSIS — S82843A Displaced bimalleolar fracture of unspecified lower leg, initial encounter for closed fracture: Secondary | ICD-10-CM | POA: Diagnosis not present

## 2016-04-17 DIAGNOSIS — J449 Chronic obstructive pulmonary disease, unspecified: Secondary | ICD-10-CM | POA: Diagnosis not present

## 2016-05-01 DIAGNOSIS — L603 Nail dystrophy: Secondary | ICD-10-CM | POA: Diagnosis not present

## 2016-05-01 DIAGNOSIS — E1142 Type 2 diabetes mellitus with diabetic polyneuropathy: Secondary | ICD-10-CM | POA: Diagnosis not present

## 2016-05-11 DIAGNOSIS — E1165 Type 2 diabetes mellitus with hyperglycemia: Secondary | ICD-10-CM | POA: Diagnosis not present

## 2016-05-17 ENCOUNTER — Emergency Department (HOSPITAL_COMMUNITY): Payer: PPO

## 2016-05-17 ENCOUNTER — Encounter (HOSPITAL_COMMUNITY): Payer: Self-pay

## 2016-05-17 ENCOUNTER — Inpatient Hospital Stay (HOSPITAL_COMMUNITY)
Admission: EM | Admit: 2016-05-17 | Discharge: 2016-05-29 | DRG: 870 | Disposition: A | Discharge status: Skilled Nursing Facility | Payer: PPO | Attending: Internal Medicine | Admitting: Internal Medicine

## 2016-05-17 DIAGNOSIS — E119 Type 2 diabetes mellitus without complications: Secondary | ICD-10-CM

## 2016-05-17 DIAGNOSIS — Z7902 Long term (current) use of antithrombotics/antiplatelets: Secondary | ICD-10-CM

## 2016-05-17 DIAGNOSIS — R579 Shock, unspecified: Secondary | ICD-10-CM | POA: Diagnosis present

## 2016-05-17 DIAGNOSIS — E662 Morbid (severe) obesity with alveolar hypoventilation: Secondary | ICD-10-CM | POA: Diagnosis present

## 2016-05-17 DIAGNOSIS — Z9641 Presence of insulin pump (external) (internal): Secondary | ICD-10-CM | POA: Diagnosis present

## 2016-05-17 DIAGNOSIS — H5441 Blindness, right eye, normal vision left eye: Secondary | ICD-10-CM | POA: Diagnosis present

## 2016-05-17 DIAGNOSIS — Z9981 Dependence on supplemental oxygen: Secondary | ICD-10-CM | POA: Diagnosis not present

## 2016-05-17 DIAGNOSIS — T383X1A Poisoning by insulin and oral hypoglycemic [antidiabetic] drugs, accidental (unintentional), initial encounter: Secondary | ICD-10-CM | POA: Diagnosis present

## 2016-05-17 DIAGNOSIS — F200 Paranoid schizophrenia: Secondary | ICD-10-CM | POA: Diagnosis not present

## 2016-05-17 DIAGNOSIS — R404 Transient alteration of awareness: Secondary | ICD-10-CM | POA: Diagnosis not present

## 2016-05-17 DIAGNOSIS — R000 Tachycardia, unspecified: Secondary | ICD-10-CM | POA: Diagnosis not present

## 2016-05-17 DIAGNOSIS — J9601 Acute respiratory failure with hypoxia: Secondary | ICD-10-CM | POA: Diagnosis present

## 2016-05-17 DIAGNOSIS — J9622 Acute and chronic respiratory failure with hypercapnia: Secondary | ICD-10-CM | POA: Diagnosis present

## 2016-05-17 DIAGNOSIS — R4182 Altered mental status, unspecified: Secondary | ICD-10-CM | POA: Diagnosis present

## 2016-05-17 DIAGNOSIS — J690 Pneumonitis due to inhalation of food and vomit: Secondary | ICD-10-CM | POA: Diagnosis not present

## 2016-05-17 DIAGNOSIS — E118 Type 2 diabetes mellitus with unspecified complications: Secondary | ICD-10-CM

## 2016-05-17 DIAGNOSIS — J9610 Chronic respiratory failure, unspecified whether with hypoxia or hypercapnia: Secondary | ICD-10-CM | POA: Diagnosis present

## 2016-05-17 DIAGNOSIS — Z91199 Patient's noncompliance with other medical treatment and regimen due to unspecified reason: Secondary | ICD-10-CM

## 2016-05-17 DIAGNOSIS — R0602 Shortness of breath: Secondary | ICD-10-CM | POA: Diagnosis not present

## 2016-05-17 DIAGNOSIS — Z7982 Long term (current) use of aspirin: Secondary | ICD-10-CM | POA: Diagnosis not present

## 2016-05-17 DIAGNOSIS — Z0189 Encounter for other specified special examinations: Secondary | ICD-10-CM

## 2016-05-17 DIAGNOSIS — J9602 Acute respiratory failure with hypercapnia: Secondary | ICD-10-CM | POA: Diagnosis not present

## 2016-05-17 DIAGNOSIS — G4733 Obstructive sleep apnea (adult) (pediatric): Secondary | ICD-10-CM | POA: Diagnosis present

## 2016-05-17 DIAGNOSIS — E876 Hypokalemia: Secondary | ICD-10-CM | POA: Diagnosis present

## 2016-05-17 DIAGNOSIS — N179 Acute kidney failure, unspecified: Secondary | ICD-10-CM | POA: Diagnosis not present

## 2016-05-17 DIAGNOSIS — Z87891 Personal history of nicotine dependence: Secondary | ICD-10-CM

## 2016-05-17 DIAGNOSIS — Z452 Encounter for adjustment and management of vascular access device: Secondary | ICD-10-CM

## 2016-05-17 DIAGNOSIS — E162 Hypoglycemia, unspecified: Secondary | ICD-10-CM | POA: Diagnosis not present

## 2016-05-17 DIAGNOSIS — I480 Paroxysmal atrial fibrillation: Secondary | ICD-10-CM | POA: Diagnosis not present

## 2016-05-17 DIAGNOSIS — A419 Sepsis, unspecified organism: Principal | ICD-10-CM | POA: Diagnosis present

## 2016-05-17 DIAGNOSIS — I2510 Atherosclerotic heart disease of native coronary artery without angina pectoris: Secondary | ICD-10-CM | POA: Diagnosis present

## 2016-05-17 DIAGNOSIS — Z794 Long term (current) use of insulin: Secondary | ICD-10-CM

## 2016-05-17 DIAGNOSIS — R1310 Dysphagia, unspecified: Secondary | ICD-10-CM | POA: Diagnosis not present

## 2016-05-17 DIAGNOSIS — G9341 Metabolic encephalopathy: Secondary | ICD-10-CM | POA: Diagnosis not present

## 2016-05-17 DIAGNOSIS — J9620 Acute and chronic respiratory failure, unspecified whether with hypoxia or hypercapnia: Secondary | ICD-10-CM | POA: Diagnosis not present

## 2016-05-17 DIAGNOSIS — D649 Anemia, unspecified: Secondary | ICD-10-CM | POA: Diagnosis present

## 2016-05-17 DIAGNOSIS — J9621 Acute and chronic respiratory failure with hypoxia: Secondary | ICD-10-CM | POA: Diagnosis not present

## 2016-05-17 DIAGNOSIS — Z4659 Encounter for fitting and adjustment of other gastrointestinal appliance and device: Secondary | ICD-10-CM

## 2016-05-17 DIAGNOSIS — F329 Major depressive disorder, single episode, unspecified: Secondary | ICD-10-CM | POA: Diagnosis present

## 2016-05-17 DIAGNOSIS — Z6841 Body Mass Index (BMI) 40.0 and over, adult: Secondary | ICD-10-CM

## 2016-05-17 DIAGNOSIS — R0689 Other abnormalities of breathing: Secondary | ICD-10-CM | POA: Diagnosis not present

## 2016-05-17 DIAGNOSIS — E11649 Type 2 diabetes mellitus with hypoglycemia without coma: Secondary | ICD-10-CM | POA: Diagnosis not present

## 2016-05-17 DIAGNOSIS — Z4682 Encounter for fitting and adjustment of non-vascular catheter: Secondary | ICD-10-CM | POA: Diagnosis not present

## 2016-05-17 DIAGNOSIS — J449 Chronic obstructive pulmonary disease, unspecified: Secondary | ICD-10-CM | POA: Diagnosis not present

## 2016-05-17 DIAGNOSIS — Z9119 Patient's noncompliance with other medical treatment and regimen: Secondary | ICD-10-CM

## 2016-05-17 DIAGNOSIS — Z79899 Other long term (current) drug therapy: Secondary | ICD-10-CM | POA: Diagnosis not present

## 2016-05-17 DIAGNOSIS — G9340 Encephalopathy, unspecified: Secondary | ICD-10-CM | POA: Diagnosis present

## 2016-05-17 DIAGNOSIS — J9600 Acute respiratory failure, unspecified whether with hypoxia or hypercapnia: Secondary | ICD-10-CM

## 2016-05-17 DIAGNOSIS — E1165 Type 2 diabetes mellitus with hyperglycemia: Secondary | ICD-10-CM | POA: Diagnosis not present

## 2016-05-17 DIAGNOSIS — R5381 Other malaise: Secondary | ICD-10-CM | POA: Diagnosis not present

## 2016-05-17 DIAGNOSIS — E785 Hyperlipidemia, unspecified: Secondary | ICD-10-CM | POA: Diagnosis present

## 2016-05-17 DIAGNOSIS — I5032 Chronic diastolic (congestive) heart failure: Secondary | ICD-10-CM | POA: Diagnosis present

## 2016-05-17 DIAGNOSIS — I110 Hypertensive heart disease with heart failure: Secondary | ICD-10-CM | POA: Diagnosis present

## 2016-05-17 DIAGNOSIS — I4891 Unspecified atrial fibrillation: Secondary | ICD-10-CM | POA: Diagnosis not present

## 2016-05-17 DIAGNOSIS — J9611 Chronic respiratory failure with hypoxia: Secondary | ICD-10-CM | POA: Diagnosis not present

## 2016-05-17 LAB — CBC WITH DIFFERENTIAL/PLATELET
Basophils Absolute: 0 10*3/uL (ref 0.0–0.1)
Basophils Relative: 0 %
Eosinophils Absolute: 0.1 10*3/uL (ref 0.0–0.7)
Eosinophils Relative: 0 %
HCT: 42.5 % (ref 39.0–52.0)
Hemoglobin: 11.9 g/dL — ABNORMAL LOW (ref 13.0–17.0)
Lymphocytes Relative: 8 %
Lymphs Abs: 1.5 10*3/uL (ref 0.7–4.0)
MCH: 26.9 pg (ref 26.0–34.0)
MCHC: 28 g/dL — ABNORMAL LOW (ref 30.0–36.0)
MCV: 96.2 fL (ref 78.0–100.0)
Monocytes Absolute: 0.7 10*3/uL (ref 0.1–1.0)
Monocytes Relative: 4 %
Neutro Abs: 16.3 10*3/uL — ABNORMAL HIGH (ref 1.7–7.7)
Neutrophils Relative %: 88 %
Platelets: 209 10*3/uL (ref 150–400)
RBC: 4.42 MIL/uL (ref 4.22–5.81)
RDW: 13.1 % (ref 11.5–15.5)
WBC: 18.5 10*3/uL — ABNORMAL HIGH (ref 4.0–10.5)

## 2016-05-17 LAB — BLOOD GAS, ARTERIAL
Acid-Base Excess: 14.3 mmol/L — ABNORMAL HIGH (ref 0.0–2.0)
Acid-Base Excess: 15.2 mmol/L — ABNORMAL HIGH (ref 0.0–2.0)
Bicarbonate: 36.1 mmol/L — ABNORMAL HIGH (ref 20.0–28.0)
Bicarbonate: 35.9 mmol/L — ABNORMAL HIGH (ref 20.0–28.0)
Drawn by: 234301
Drawn by: 38235
FIO2: 100
FIO2: 1
MECHVT: 500 mL
O2 Content: 100 L/min
O2 Saturation: 97.6 %
O2 Saturation: 87.2 %
PEEP: 5 cmH2O
RATE: 16 resp/min
TCO2: 13.2 mmol/L (ref 0–100)
pCO2 arterial: 87.6 mmHg (ref 32.0–48.0)
pCO2 arterial: 105 mmHg (ref 32.0–48.0)
pH, Arterial: 7.294 — ABNORMAL LOW (ref 7.350–7.450)
pH, Arterial: 7.235 — ABNORMAL LOW (ref 7.350–7.450)
pO2, Arterial: 107 mmHg (ref 83.0–108.0)
pO2, Arterial: 63.5 mmHg — ABNORMAL LOW (ref 83.0–108.0)

## 2016-05-17 LAB — COMPREHENSIVE METABOLIC PANEL
ALT: 9 U/L — ABNORMAL LOW (ref 17–63)
AST: 15 U/L (ref 15–41)
Albumin: 3.9 g/dL (ref 3.5–5.0)
Alkaline Phosphatase: 64 U/L (ref 38–126)
Anion gap: 9 (ref 5–15)
BUN: 10 mg/dL (ref 6–20)
CO2: 41 mmol/L — ABNORMAL HIGH (ref 22–32)
Calcium: 8.8 mg/dL — ABNORMAL LOW (ref 8.9–10.3)
Chloride: 93 mmol/L — ABNORMAL LOW (ref 101–111)
Creatinine, Ser: 1 mg/dL (ref 0.61–1.24)
GFR calc Af Amer: >60 mL/min (ref >60)
GFR calc non Af Amer: >60 mL/min (ref >60)
Glucose, Bld: <20 mg/dL — CL (ref 65–99)
Potassium: 3.2 mmol/L — ABNORMAL LOW (ref 3.5–5.1)
Sodium: 143 mmol/L (ref 135–145)
Total Bilirubin: 0.2 mg/dL — ABNORMAL LOW (ref 0.3–1.2)
Total Protein: 6.9 g/dL (ref 6.5–8.1)

## 2016-05-17 LAB — URINALYSIS, ROUTINE W REFLEX MICROSCOPIC
Bilirubin Urine: NEGATIVE
Glucose, UA: NEGATIVE mg/dL
Hgb urine dipstick: NEGATIVE
Ketones, ur: NEGATIVE mg/dL
Leukocytes, UA: NEGATIVE
Nitrite: NEGATIVE
Protein, ur: NEGATIVE mg/dL
Specific Gravity, Urine: 1.025 (ref 1.005–1.030)
pH: 5 (ref 5.0–8.0)

## 2016-05-17 LAB — CBG MONITORING, ED
GLUCOSE-CAPILLARY: 19 mg/dL — AB (ref 65–99)
GLUCOSE-CAPILLARY: 40 mg/dL — AB (ref 65–99)
Glucose-Capillary: 168 mg/dL — ABNORMAL HIGH (ref 65–99)
Glucose-Capillary: 200 mg/dL — ABNORMAL HIGH (ref 65–99)
Glucose-Capillary: 38 mg/dL — CL (ref 65–99)
Glucose-Capillary: 55 mg/dL — ABNORMAL LOW (ref 65–99)
Glucose-Capillary: 75 mg/dL (ref 65–99)

## 2016-05-17 LAB — RAPID URINE DRUG SCREEN, HOSP PERFORMED
Amphetamines: NOT DETECTED
Barbiturates: NOT DETECTED
Benzodiazepines: NOT DETECTED
Cocaine: NOT DETECTED
Opiates: POSITIVE — AB
Tetrahydrocannabinol: NOT DETECTED

## 2016-05-17 LAB — PROCALCITONIN

## 2016-05-17 LAB — SALICYLATE LEVEL: Salicylate Lvl: <4 mg/dL (ref 2.8–30.0)

## 2016-05-17 LAB — MAGNESIUM: Magnesium: 1.8 mg/dL (ref 1.7–2.4)

## 2016-05-17 LAB — TROPONIN I: Troponin I: <0.03 ng/mL (ref <0.03)

## 2016-05-17 LAB — ETHANOL: Alcohol, Ethyl (B): 8 mg/dL — ABNORMAL HIGH (ref <5)

## 2016-05-17 LAB — I-STAT CG4 LACTIC ACID, ED: Lactic Acid, Venous: 1.39 mmol/L (ref 0.5–1.9)

## 2016-05-17 LAB — ACETAMINOPHEN LEVEL: Acetaminophen (Tylenol), Serum: <10 ug/mL — ABNORMAL LOW (ref 10–30)

## 2016-05-17 LAB — BRAIN NATRIURETIC PEPTIDE: B NATRIURETIC PEPTIDE 5: 108 pg/mL — AB (ref 0.0–100.0)

## 2016-05-17 MED ORDER — SODIUM CHLORIDE 0.9 % IV BOLUS (SEPSIS)
1000.0000 mL | Freq: Once | INTRAVENOUS | Status: AC
  Administered 2016-05-17: 1000 mL via INTRAVENOUS

## 2016-05-17 MED ORDER — DEXTROSE 50 % IV SOLN: 1.0000 | Freq: Once | INTRAVENOUS | Status: DC

## 2016-05-17 MED ORDER — ROCURONIUM BROMIDE 50 MG/5ML IV SOLN
100.0000 mg | Freq: Once | INTRAVENOUS | Status: AC
  Administered 2016-05-17: 100 mg via INTRAVENOUS

## 2016-05-17 MED ORDER — DEXTROSE 50 % IV SOLN
INTRAVENOUS | Status: AC
  Administered 2016-05-17: 50 mL
  Filled 2016-05-17: qty 50

## 2016-05-17 MED ORDER — DEXTROSE 10 % IV SOLN
Freq: Once | INTRAVENOUS | Status: AC
  Administered 2016-05-17: 23:00:00 via INTRAVENOUS

## 2016-05-17 MED ORDER — DEXTROSE 50 % IV SOLN
INTRAVENOUS | Status: AC
  Filled 2016-05-17: qty 50

## 2016-05-17 MED ORDER — DEXTROSE 5 % IV SOLN
2.0000 g | Freq: Once | INTRAVENOUS | Status: AC
  Administered 2016-05-17: 2 g via INTRAVENOUS
  Filled 2016-05-17: qty 2

## 2016-05-17 MED ORDER — FENTANYL CITRATE (PF) 2500 MCG/50ML IJ SOLN
INTRAMUSCULAR | Status: AC
  Filled 2016-05-17: qty 50

## 2016-05-17 MED ORDER — VANCOMYCIN HCL IN DEXTROSE 1-5 GM/200ML-% IV SOLN
1000.0000 mg | INTRAVENOUS | Status: AC
  Administered 2016-05-17: 1000 mg via INTRAVENOUS
  Filled 2016-05-17: qty 200

## 2016-05-17 MED ORDER — DEXTROSE 10 % IV SOLN
INTRAVENOUS | Status: DC
  Administered 2016-05-17 – 2016-05-18 (×2): via INTRAVENOUS

## 2016-05-17 MED ORDER — DEXTROSE 50 % IV SOLN
1.0000 | Freq: Once | INTRAVENOUS | Status: AC
  Administered 2016-05-17: 50 mL via INTRAVENOUS

## 2016-05-17 MED ORDER — FENTANYL CITRATE (PF) 100 MCG/2ML IJ SOLN
INTRAMUSCULAR | Status: AC
  Filled 2016-05-17: qty 2

## 2016-05-17 MED ORDER — VANCOMYCIN HCL IN DEXTROSE 1-5 GM/200ML-% IV SOLN
1000.0000 mg | Freq: Two times a day (BID) | INTRAVENOUS | Status: DC
  Administered 2016-05-18 – 2016-05-19 (×3): 1000 mg via INTRAVENOUS
  Filled 2016-05-17 (×4): qty 200

## 2016-05-17 MED ORDER — DIAZEPAM 5 MG/ML IJ SOLN
5.0000 mg | Freq: Once | INTRAMUSCULAR | Status: AC
  Administered 2016-05-17: 5 mg via INTRAVENOUS
  Filled 2016-05-17: qty 2

## 2016-05-17 MED ORDER — FENTANYL CITRATE (PF) 100 MCG/2ML IJ SOLN
100.0000 ug | Freq: Once | INTRAMUSCULAR | Status: AC
  Administered 2016-05-17: 100 ug via INTRAVENOUS

## 2016-05-17 MED ORDER — FENTANYL CITRATE (PF) 100 MCG/2ML IJ SOLN
100.0000 ug | Freq: Once | INTRAMUSCULAR | Status: AC
  Administered 2016-05-17: 100 ug via INTRAVENOUS
  Filled 2016-05-17: qty 2

## 2016-05-17 MED ORDER — POTASSIUM CHLORIDE 10 MEQ/100ML IV SOLN
10.0000 meq | INTRAVENOUS | Status: AC
  Administered 2016-05-17 – 2016-05-18 (×2): 10 meq via INTRAVENOUS
  Filled 2016-05-17 (×2): qty 100

## 2016-05-17 MED ORDER — SODIUM CHLORIDE 0.9 % IV SOLN
25.0000 ug/h | INTRAVENOUS | Status: DC
  Administered 2016-05-17: 25 ug/h via INTRAVENOUS
  Filled 2016-05-17: qty 50

## 2016-05-17 MED ORDER — DEXTROSE 5 % IV SOLN
2.0000 g | Freq: Three times a day (TID) | INTRAVENOUS | Status: DC
  Administered 2016-05-18 – 2016-05-22 (×13): 2 g via INTRAVENOUS
  Filled 2016-05-17 (×15): qty 2

## 2016-05-17 MED ORDER — ETOMIDATE 2 MG/ML IV SOLN
30.0000 mg | Freq: Once | INTRAVENOUS | Status: AC
  Administered 2016-05-17: 30 mg via INTRAVENOUS

## 2016-05-17 MED ORDER — DEXTROSE 50 % IV SOLN: 50.0000 mL | Freq: Once | INTRAVENOUS | Status: DC

## 2016-05-17 MED ORDER — PROPOFOL 1000 MG/100ML IV EMUL
INTRAVENOUS | Status: AC
  Administered 2016-05-17: 20:00:00
  Filled 2016-05-17: qty 100

## 2016-05-17 MED ORDER — NALOXONE HCL 2 MG/2ML IJ SOSY
2.0000 mg | PREFILLED_SYRINGE | Freq: Once | INTRAMUSCULAR | Status: AC
  Administered 2016-05-17: 2 mg via INTRAVENOUS
  Filled 2016-05-17: qty 2

## 2016-05-17 NOTE — ED Notes (Signed)
Potassium flow rate decreased to 50 ml/hr.

## 2016-05-17 NOTE — ED Provider Notes (Signed)
[facility] DEPT Provider Note   CSN: [id number] Arrival date & time: 05/17/16  1712     History   Chief Complaint Chief Complaint  Patient presents with  . Altered Mental Status    HPI Rick Cruz is a 70 y.o. male.  HPI   18yM with altered mental status. Per EMS, they were called because pt was difficult to arouse. Family and patient were reportedly tinkering with insulin pump just prior. EMS reports that wife told them that his blood glucose was 335 and she gave him 30u of insulin. On their arrival he was confused, but talking. He became unresponsive on ride to ED. Glucose rechecked and 165. Pupils seemed constricted and given 1mg  narcan with no clinical response. On arrival to ED he was very confused. The only response he would give to questions was that he couldn't breath. He did move all extremities on command. Initial CBG in ED was 19.   Past Medical History:  Diagnosis Date  . 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 CHF (congestive heart failure) (HCC)    diastolic   . Chronic pain   . COPD (chronic obstructive pulmonary disease) ([place])   . Depression   . Diabetes mellitus without complication (HCC)    insulin pump  . Hypercholesterolemia   . On home O2    3 liters  . Schizophrenia ([place])   . Sleep apnea    noncompliant with BiPAP  . Tobacco abuse     Patient Active Problem List   Diagnosis Date Noted  . Aspiration pneumonia ([place]) 08/16/2015  . Lorazepam overdose 08/16/2015  . Obesity hypoventilation syndrome ([place]) 04/05/2015  . CSA (central sleep apnea) 04/05/2015  . Non compliance with medical treatment 04/05/2015  . Acute respiratory failure with hypoxia and hypercarbia (HCC)   . Acute on chronic diastolic CHF (congestive heart failure) ([place]) 08/06/2014  . Encephalopathy, metabolic [id number]  . COPD with acute exacerbation ([place]) 08/04/2014  . COPD (chronic obstructive pulmonary disease) ([place]) 07/14/2014  . Recurrent falls 05/08/2014  . Ankle  fracture, right 05/08/2014  . Ankle fracture, left 05/08/2014  . Acute respiratory failure with hypercapnia ([place]) 05/08/2014  . C. difficile colitis 01/04/2014  . Delirium 01/02/2014  . Acute encephalopathy 01/02/2014  . Morbid obesity ([place]) 06/17/2013  . Erosive esophagitis 04/22/2013  . Hyperkalemia 04/22/2013  . Chronic respiratory failure 02 dep with hypercarbia 04/22/2013  . DM (diabetes mellitus) ([place]) 04/21/2013  . COPD exacerbation ([place]) 04/18/2013  . Paranoid schizophrenia ([place]) 04/18/2013  . CAD (coronary artery disease) 04/18/2013  . Dyspnea 08/26/2008  . DYSLIPIDEMIA 08/25/2008  . Obstructive sleep apnea 08/25/2008  . Essential hypertension 08/25/2008    Past Surgical History:  Procedure Laterality Date  . COLON SURGERY  March 2010   secondary to large colon polyp, final path per discharge summary notes tubulovillous adenoma.   . COLONOSCOPY  July 2011   Dr. Benson Norway: multiple polyps, internal and external hemorrhoids, diverticulosis. tubular adenoma  . ESOPHAGOGASTRODUODENOSCOPY (EGD) WITH PROPOFOL N/A 04/20/2013   Procedure: ESOPHAGOGASTRODUODENOSCOPY (EGD) WITH PROPOFOL;  Surgeon: Daneil Dolin, MD;  Location: [facility];  Service: Endoscopy;  Laterality: N/A;  . KNEE SURGERY     X2       Home Medications    Prior to Admission medications   Medication Sig Start Date End Date Taking? Authorizing Provider  albuterol (PROVENTIL HFA;VENTOLIN HFA) 108 (90 BASE) MCG/ACT inhaler Inhale 2 puffs into the lungs every 6 (six) hours as needed for wheezing  or shortness of breath. 05/11/14   Radene Gunning, NP  albuterol (PROVENTIL) (2.5 MG/3ML) 0.083% nebulizer solution Take 3 mLs (2.5 mg total) by nebulization every 6 (six) hours as needed for wheezing or shortness of breath. 08/21/15   Kathie Dike, MD  amoxicillin-clavulanate (AUGMENTIN) 875-125 MG tablet Take 1 tablet by mouth every 12 (twelve) hours. 08/21/15   Kathie Dike, MD  aspirin 81 MG chewable tablet Chew 81 mg by  mouth every morning.     Historical Provider, MD  cholecalciferol (VITAMIN D) 1000 UNITS tablet Take 2,000 Units by mouth daily.    Historical Provider, MD  Cinnamon 500 MG capsule Take 1,000 mg by mouth every morning.     Historical Provider, MD  citalopram (CELEXA) 20 MG tablet Take 20 mg by mouth every morning.    Historical Provider, MD  clopidogrel (PLAVIX) 75 MG tablet Take 75 mg by mouth every morning.    Historical Provider, MD  furosemide (LASIX) 40 MG tablet Take 1 tablet (40 mg total) by mouth daily. 08/10/14   Kathie Dike, MD  gabapentin (NEURONTIN) 300 MG capsule Take 300 mg by mouth 4 (four) times daily.     Historical Provider, MD  HYDROcodone-acetaminophen (NORCO/VICODIN) 5-325 MG per tablet Take 1 tablet by mouth every 4 (four) hours as needed for moderate pain.     Historical Provider, MD  Insulin Human (INSULIN PUMP) SOLN Inject into the skin continuous. Humalog    Historical Provider, MD  metoprolol (LOPRESSOR) 50 MG tablet Take 50 mg by mouth 2 (two) times daily.    Historical Provider, MD  pantoprazole (PROTONIX) 40 MG tablet Take 40 mg by mouth 2 (two) times daily.    Historical Provider, MD  predniSONE (DELTASONE) 10 MG tablet Take 40mg  po daily for 2 days then 30mg  daily for 2 days then 20mg  daily for 2 days then 10mg  daily for 2 days then stop 08/21/15   Kathie Dike, MD  simvastatin (ZOCOR) 40 MG tablet Take 40 mg by mouth every evening.    Historical Provider, MD  sodium chloride 1 G tablet Take 1 g by mouth 2 (two) times daily.    Historical Provider, MD  terazosin (HYTRIN) 1 MG capsule Take 1 mg by mouth 2 (two) times daily.     Historical Provider, MD  Tetrahydrozoline HCl (VISINE OP) Apply 1 drop to eye daily as needed (irritation).     Historical Provider, MD  tiotropium (SPIRIVA HANDIHALER) 18 MCG inhalation capsule Place 1 capsule (18 mcg total) into inhaler and inhale every morning. 05/11/14   Radene Gunning, NP  traZODone (DESYREL) 150 MG tablet Take 300 mg by  mouth at bedtime.     Historical Provider, MD  triamcinolone cream (KENALOG) 0.1 % Apply 1 application topically 2 (two) times daily as needed (for irritation).     Historical Provider, MD  ziprasidone (GEODON) 80 MG capsule Take 80 mg by mouth every evening.    Historical Provider, MD    Family History Family History  Problem Relation Age of Onset  . Thyroid disease Mother   . Parkinson's disease Father   . Parkinson's disease    . Colon cancer Neg Hx     Social History Social History  Substance Use Topics  . Smoking status: Former Smoker    Packs/day: 1.00    Years: 50.00    Types: Cigarettes    Quit date: 09/16/2013  . Smokeless tobacco: Never Used  . Alcohol use No     Comment:  history of ETOH abuse in remote past, none in at least 10 years     Allergies   Phenergan [promethazine hcl]; Haldol [haloperidol lactate]; and Metformin and related   Review of Systems Review of Systems  Level 5 caveat because of critical illness.   Physical Exam Updated Vital Signs BP (!) 95/53   Pulse 73   Resp 15   SpO2 94%   Physical Exam  Constitutional: He appears distressed.  Laying in bed. Poorly responsive. Diaphoretic. Obese. NO external signs of acute trauma.   HENT:  Head: Normocephalic and atraumatic.  Eyes: Conjunctivae are normal. Pupils are equal, round, and reactive to light.  Neck: Neck supple.  Cardiovascular:  No murmur heard. irrregularly irregular  Pulmonary/Chest: Effort normal and breath sounds normal. No respiratory distress.  Abdominal: Soft. There is no tenderness.  Musculoskeletal: He exhibits no edema.  Neurological:  Drowsy. Opens eyes when shouted at. Grip strength equal. Moving both feet on command. Not following commands otherwise. No obvious facial droop. Muscle tone normal.   Skin: He is diaphoretic.  Nursing note and vitals reviewed.    ED Treatments / Results  Labs (all labs ordered are listed, but only abnormal results are  displayed) Labs Reviewed  BLOOD GAS, ARTERIAL - Abnormal; Notable for the following:       Result Value   pH, Arterial 7.294 (*)    pCO2 arterial 87.6 (*)    Bicarbonate 36.1 (*)    Acid-Base Excess 14.3 (*)    All other components within normal limits  CBC WITH DIFFERENTIAL/PLATELET - Abnormal; Notable for the following:    WBC 18.5 (*)    Hemoglobin 11.9 (*)    MCHC 28.0 (*)    Neutro Abs 16.3 (*)    All other components within normal limits  COMPREHENSIVE METABOLIC PANEL - Abnormal; Notable for the following:    Potassium 3.2 (*)    Chloride 93 (*)    CO2 41 (*)    Glucose, Bld <20 (*)    Calcium 8.8 (*)    ALT 9 (*)    Total Bilirubin 0.2 (*)    All other components within normal limits  ACETAMINOPHEN LEVEL - Abnormal; Notable for the following:    Acetaminophen (Tylenol), Serum <10 (*)    All other components within normal limits  URINE RAPID DRUG SCREEN, HOSP PERFORMED - Abnormal; Notable for the following:    Opiates POSITIVE (*)    All other components within normal limits  ETHANOL - Abnormal; Notable for the following:    Alcohol, Ethyl (B) 8 (*)    All other components within normal limits  CBG MONITORING, ED - Abnormal; Notable for the following:    Glucose-Capillary 19 (*)    All other components within normal limits  CBG MONITORING, ED - Abnormal; Notable for the following:    Glucose-Capillary 200 (*)    All other components within normal limits  CBG MONITORING, ED - Abnormal; Notable for the following:    Glucose-Capillary 38 (*)    All other components within normal limits  CBG MONITORING, ED - Abnormal; Notable for the following:    Glucose-Capillary 55 (*)    All other components within normal limits  TROPONIN I  SALICYLATE LEVEL  URINALYSIS, ROUTINE W REFLEX MICROSCOPIC (NOT AT [facility])  MAGNESIUM  I-STAT CG4 LACTIC ACID, ED    EKG  EKG Interpretation None       Radiology Dg Chest Portable 1 View  Result Date: 05/17/2016 CLINICAL DATA:  Status post intubation. EXAM: PORTABLE CHEST 1 VIEW COMPARISON:  05/17/16 FINDINGS: ET tube tip is above the carina. There is a nasogastric tube with tip below the field of view. Cardiac enlargement noted. Small pleural effusions are identified. Pulmonary vascular congestion. IMPRESSION: 1. Satisfactory position of ET tube with tip above the carina. 2. Mild CHF. Electronically Signed   By: Kerby Moors M.D.   On: 05/17/2016 18:31   Dg Chest Portable 1 View  Result Date: 05/17/2016 CLINICAL DATA:  Shortness of breath and altered mental status. EXAM: PORTABLE CHEST 1 VIEW COMPARISON:  02/20/2016 FINDINGS: Cardiomegaly and mild pulmonary vascular congestion noted. Mild peribronchial thickening is unchanged. The left retrocardiac region is difficult to evaluate on this study. No definite effusion or pneumothorax noted. IMPRESSION: Cardiomegaly with mild pulmonary vascular congestion. Left retrocardiac region difficult to evaluate on this study. Electronically Signed   By: Margarette Canada M.D.   On: 05/17/2016 17:48    Procedures Procedures (including critical care time)  CRITICAL CARE Performed by: Virgel Manifold Total critical care time: 50 minutes Critical care time was exclusive of separately billable procedures and treating other patients. Critical care was necessary to treat or prevent imminent or life-threatening deterioration. Critical care was time spent personally by me on the following activities: development of treatment plan with patient and/or surrogate as well as nursing, discussions with consultants, evaluation of patient's response to treatment, examination of patient, obtaining history from patient or surrogate, ordering and performing treatments and interventions, ordering and review of laboratory studies, ordering and review of radiographic studies, pulse oximetry and re-evaluation of patient's condition.   Medications Ordered in ED Medications  dextrose 50 % solution (not  administered)  dextrose 50 % solution 50 mL (not administered)  dextrose 50 % solution 50 mL (not administered)  dextrose 10 % infusion ( Intravenous New Bag/Given 05/17/16 1912)  potassium chloride 10 mEq in 100 mL IVPB (not administered)  fentaNYL (SUBLIMAZE) 2,500 mcg in sodium chloride 0.9 % 250 mL (10 mcg/mL) infusion (25 mcg/hr Intravenous New Bag/Given 05/17/16 2048)  fentaNYL (SUBLIMAZE) injection 100 mcg (not administered)  ceFEPIme (MAXIPIME) 2 g in dextrose 5 % 50 mL IVPB (not administered)  vancomycin (VANCOCIN) IVPB 1000 mg/200 mL premix (not administered)  ceFEPIme (MAXIPIME) 2 g in dextrose 5 % 50 mL IVPB (not administered)  vancomycin (VANCOCIN) IVPB 1000 mg/200 mL premix (not administered)  diazepam (VALIUM) injection 5 mg (not administered)  naloxone (NARCAN) injection 2 mg (2 mg Intravenous Given 05/17/16 1738)  sodium chloride 0.9 % bolus 1,000 mL (1,000 mLs Intravenous New Bag/Given 05/17/16 1745)  etomidate (AMIDATE) injection 30 mg (30 mg Intravenous Given 05/17/16 1800)  rocuronium (ZEMURON) injection 100 mg (100 mg Intravenous Given 05/17/16 1801)  dextrose 50 % solution 50 mL (50 mLs Intravenous Given 05/17/16 1846)  propofol (DIPRIVAN) 1000 MG/100ML infusion (  Hold 05/17/16 2030)  fentaNYL (SUBLIMAZE) injection 100 mcg (100 mcg Intravenous Given 05/17/16 2025)     Initial Impression / Assessment and Plan / ED Course  I have reviewed the triage vital signs and the nursing notes.  Pertinent labs & imaging results that were available during my care of the patient were reviewed by me and considered in my medical decision making (see chart for details).  Clinical Course    70yM with encephalopathy. Unsure fo exact etiology. Previous admissions with overdose. Hx of substance abuse but no known acute ingestion. Of note, his son died unexpectedly earlier today.   He was poorly responsive on arrival to  ED and only saying that he couldn't breath. He did follows simple  commands though such as move feet/squeeze hands. He suprisingly did this with glucose of 19. He was given dextrose with improvement of glucose but his mental status actually worsened and he lacked a gag. He was subsequently intubated for airway protection. BP soft. Bolus IVF and reassess.      Persistent hypoglycemia requiring repeat boluses of d50. Started on d10 gtt. BP better. Now biting at ETT and seems like he may be trying to mouth some words. Wife currently at bedside. Pt was understandably upset about the death of his son, but he didn't voice any specific somatic complaints to her. No ingestion or trauma that she is aware of.   Pt more awake/alert. Intubated for airway protection. When propofol turned down for hypotension he began to clearly mouth words over ETT and following commands. Issues with adequate sedation anyways so extubated in ED. Unfortunately repeat BAG with increased pCO2 so will place on BiPAP.   Final Clinical Impressions(s) / ED Diagnoses   Final diagnoses:  Hypoglycemia  Acute encephalopathy  Hypercapnemia    New Prescriptions New Prescriptions   No medications on file     Virgel Manifold, MD 05/23/16 1247

## 2016-05-17 NOTE — ED Notes (Signed)
Pt placed on Bipap

## 2016-05-17 NOTE — ED Notes (Signed)
Second dose of Dextrose 25 grams given

## 2016-05-17 NOTE — ED Notes (Signed)
Unable to obtain lactic acid

## 2016-05-17 NOTE — ED Notes (Signed)
Critical blood gas levels given to Dr. Wilson Singer.

## 2016-05-17 NOTE — ED Notes (Signed)
Pt extubated at 2200. Dr. Wilson Singer in room with pt at present.

## 2016-05-17 NOTE — Procedures (Signed)
Extubation Procedure Note  Patient Details:   Name: Rick Cruz DOB: 1946-02-07 MRN: [id number]   Airway Documentation:  Airway 8 mm (Active)  Secured at (cm) 25 cm 05/17/2016  8:22 PM  Measured From Lips 05/17/2016  8:22 PM  Secured Location Right 05/17/2016  8:22 PM  Secured By [employer] 05/17/2016  8:22 PM  Tube Holder Repositioned Yes 05/17/2016  8:22 PM  Site Condition Dry 05/17/2016  8:22 PM    Evaluation  O2 sats: stable throughout Complications: No apparent complications Patient did tolerate procedure well. Bilateral Breath Sounds: Rhonchi   Yes  [name] 05/17/2016, 10:26 PM

## 2016-05-17 NOTE — ED Notes (Signed)
Critical result of Glu <20 on initial lab draw given to Dr. Wilson Singer.

## 2016-05-17 NOTE — ED Triage Notes (Signed)
Per EMS, called out for patient having altered mental status. Pt's blood sugar was 335 and spouse gave pt 30 units of insulin. When pt was in EMS, truck he became unresponsive. CBG was rechecked and it was 165 per EMS. Pt was given Narcan 1 mg IV prior to pt pulling IV out. On arrival to ED pt was combative but kept stating he could not breath.

## 2016-05-17 NOTE — ED Notes (Signed)
Repeat CBG 19 . Dextrose 25 grams given

## 2016-05-17 NOTE — Consult Note (Signed)
History and Physical    KWON VERDUN [id number] DOB: 01/03/1946 DOA: 05/17/2016  PCP: [name], MD  Patient coming from:  home  Chief Complaint:   Sob, confused  HPI: Rick Cruz is a 70 y.o. male with medical history significant of chf, copd, OSA brought in by wife for confusion and not acting right.  She thought it was his sugar so she checked it and it was over 300.  She called 911.  In the meantime, she bolus him 30 units of insulin.  With ems en route he became more unresponsive, glucose was rechecked and it was over 100.  On arrival to ED it was rechecked and it was 19, amp of d50 was given along with a dose of narcan.  Pt continued to deteriorate in mental status and kept saying he was short of breath.  He was intubated in the ED for airway protection due to his acute hypoxia and tachypnea and somulence.  Family does not know of any extra meds taken.  Pt referred for admission for acute hypoxic and hypercapneic respiratory failure.  Pt has h/o admit end of 2016 for drug overusage.  Of note, his youngest son died this am, so wife is very distraught.  Review of Systems: unknown to to ams  Past Medical History:  Diagnosis Date  . 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 CHF (congestive heart failure) (HCC)    diastolic   . Chronic pain   . COPD (chronic obstructive pulmonary disease) ([place])   . Depression   . Diabetes mellitus without complication (HCC)    insulin pump  . Hypercholesterolemia   . On home O2    3 liters  . Schizophrenia ([place])   . Sleep apnea    noncompliant with BiPAP  . Tobacco abuse     Past Surgical History:  Procedure Laterality Date  . COLON SURGERY  March 2010   secondary to large colon polyp, final path per discharge summary notes tubulovillous adenoma.   . COLONOSCOPY  July 2011   Dr. Benson Norway: multiple polyps, internal and external hemorrhoids, diverticulosis. tubular adenoma  . ESOPHAGOGASTRODUODENOSCOPY (EGD) WITH PROPOFOL N/A 04/20/2013     Procedure: ESOPHAGOGASTRODUODENOSCOPY (EGD) WITH PROPOFOL;  Surgeon: Daneil Dolin, MD;  Location: [facility];  Service: Endoscopy;  Laterality: N/A;  . KNEE SURGERY     X2     reports that he quit smoking about 2 years ago. His smoking use included Cigarettes. He has a 50.00 pack-year smoking history. He has never used smokeless tobacco. He reports that he does not drink alcohol or use drugs.  Allergies  Allergen Reactions  . Phenergan [Promethazine Hcl] Other (See Comments)    Becomes very confused and aggressive  . Haldol [Haloperidol Lactate] Other (See Comments)    Shaking   . Metformin And Related Diarrhea    Family History  Problem Relation Age of Onset  . Thyroid disease Mother   . Parkinson's disease Father   . Parkinson's disease    . Colon cancer Neg Hx     Prior to Admission medications   Medication Sig Start Date End Date Taking? Authorizing Provider  clopidogrel (PLAVIX) 75 MG tablet Take 75 mg by mouth every morning.   Yes Historical Provider, MD  furosemide (LASIX) 40 MG tablet Take 1 tablet (40 mg total) by mouth daily. 08/10/14  Yes Kathie Dike, MD  gabapentin (NEURONTIN) 300 MG capsule Take 300 mg by mouth 4 (four) times  daily.    Yes Historical Provider, MD  insulin lispro (HUMALOG) 100 UNIT/ML injection Inject into the skin. Up to 300 units with pump.   Yes Historical Provider, MD  metoprolol tartrate (LOPRESSOR) 25 MG tablet Take 25 mg by mouth 2 (two) times daily.   Yes Historical Provider, MD  pioglitazone (ACTOS) 15 MG tablet Take 7.5-15 mg by mouth daily.   Yes Historical Provider, MD  simvastatin (ZOCOR) 40 MG tablet Take 40 mg by mouth every evening.   Yes Historical Provider, MD  sodium chloride 1 G tablet Take 2 g by mouth 2 (two) times daily.    Yes Historical Provider, MD  terazosin (HYTRIN) 1 MG capsule Take 1 mg by mouth 2 (two) times daily.    Yes Historical Provider, MD  traZODone (DESYREL) 150 MG tablet Take 150 mg by mouth at bedtime.    Yes  Historical Provider, MD  ziprasidone (GEODON) 80 MG capsule Take 80 mg by mouth 2 (two) times daily with a meal.    Yes Historical Provider, MD  albuterol (PROVENTIL HFA;VENTOLIN HFA) 108 (90 BASE) MCG/ACT inhaler Inhale 2 puffs into the lungs every 6 (six) hours as needed for wheezing or shortness of breath. 05/11/14   Radene Gunning, NP  albuterol (PROVENTIL) (2.5 MG/3ML) 0.083% nebulizer solution Take 3 mLs (2.5 mg total) by nebulization every 6 (six) hours as needed for wheezing or shortness of breath. 08/21/15   Kathie Dike, MD  amoxicillin-clavulanate (AUGMENTIN) 875-125 MG tablet Take 1 tablet by mouth every 12 (twelve) hours. 08/21/15   Kathie Dike, MD  aspirin 81 MG chewable tablet Chew 81 mg by mouth every morning.     Historical Provider, MD  cholecalciferol (VITAMIN D) 1000 UNITS tablet Take 2,000 Units by mouth daily.    Historical Provider, MD  Cinnamon 500 MG capsule Take 1,000 mg by mouth every morning.     Historical Provider, MD  citalopram (CELEXA) 20 MG tablet Take 20 mg by mouth every morning.    Historical Provider, MD  HYDROcodone-acetaminophen (NORCO/VICODIN) 5-325 MG per tablet Take 1 tablet by mouth every 4 (four) hours as needed for moderate pain.     Historical Provider, MD  Insulin Human (INSULIN PUMP) SOLN Inject into the skin continuous. Humalog    Historical Provider, MD  pantoprazole (PROTONIX) 40 MG tablet Take 40 mg by mouth 2 (two) times daily.    Historical Provider, MD  predniSONE (DELTASONE) 10 MG tablet Take 40mg  po daily for 2 days then 30mg  daily for 2 days then 20mg  daily for 2 days then 10mg  daily for 2 days then stop 08/21/15   Kathie Dike, MD  Tetrahydrozoline HCl (VISINE OP) Apply 1 drop to eye daily as needed (irritation).     Historical Provider, MD  tiotropium (SPIRIVA HANDIHALER) 18 MCG inhalation capsule Place 1 capsule (18 mcg total) into inhaler and inhale every morning. 05/11/14   Radene Gunning, NP  triamcinolone cream (KENALOG) 0.1 % Apply 1  application topically 2 (two) times daily as needed (for irritation).     Historical Provider, MD    Physical Exam: Vitals:   05/17/16 2031 05/17/16 2032 05/17/16 2040 05/17/16 2045  BP: (!) 67/41 (!) 72/35 96/63 111/92  Pulse: (!) 57 (!) 53 60 66  Resp: 18 18 13 19   Temp:      TempSrc:      SpO2: 93% 93% 91% 95%      Constitutional: NAD, calm, comfortable Vitals:   05/17/16 2031 05/17/16 2032 05/17/16  2040 05/17/16 2045  BP: (!) 67/41 (!) 72/35 96/63 111/92  Pulse: (!) 57 (!) 53 60 66  Resp: 18 18 13 19   Temp:      TempSrc:      SpO2: 93% 93% 91% 95%   Eyes: PERRL, lids and conjunctivae normal ENMT: Mucous membranes are moist. Posterior pharynx clear of any exudate or lesions.Normal dentition.   Intubated and mildly sedated Neck: normal, supple, no masses, no thyromegaly Respiratory: clear to auscultation bilaterally, wheezing, no crackles. Normal respiratory effort. No accessory muscle use.  Cardiovascular: Regular rate and rhythm, no murmurs / rubs / gallops. No extremity edema. 2+ pedal pulses. No carotid bruits.  Abdomen: no tenderness, no masses palpated. No hepatosplenomegaly. Bowel sounds positive.  Musculoskeletal: no clubbing / cyanosis. No joint deformity upper and lower extremities. Good ROM, no contractures. Normal muscle tone.  Skin: no rashes, lesions, ulcers. No induration Neurologic:  Moving all extremities Psych- sedated and intubated   Labs on Admission: I have personally reviewed following labs and imaging studies  CBC:  Recent Labs Lab 05/17/16 1748  WBC 18.5*  NEUTROABS 16.3*  HGB 11.9*  HCT 42.5  MCV 96.2  PLT [id number]   Basic Metabolic Panel:  Recent Labs Lab 05/17/16 1748  NA 143  K 3.2*  CL 93*  CO2 41*  GLUCOSE <20*  BUN 10  CREATININE 1.00  CALCIUM 8.8*  MG 1.8   GFR: CrCl cannot be calculated (Unknown ideal weight.). Liver Function Tests:  Recent Labs Lab 05/17/16 1748  AST 15  ALT 9*  ALKPHOS 64  BILITOT 0.2*  PROT  6.9  ALBUMIN 3.9   Cardiac Enzymes:  Recent Labs Lab 05/17/16 1748  TROPONINI <0.03   CBG:  Recent Labs Lab 05/17/16 1718 05/17/16 1734 05/17/16 1844 05/17/16 1922  GLUCAP 19* 200* 38* 55*   Urine analysis:    Component Value Date/Time   COLORURINE YELLOW 05/17/2016 [place] 05/17/2016 1727   LABSPEC 1.025 05/17/2016 1727   PHURINE 5.0 05/17/2016 1727   GLUCOSEU NEGATIVE 05/17/2016 1727   HGBUR NEGATIVE 05/17/2016 1727   BILIRUBINUR NEGATIVE 05/17/2016 1727   KETONESUR NEGATIVE 05/17/2016 1727   PROTEINUR NEGATIVE 05/17/2016 1727   UROBILINOGEN 1.0 12/05/2014 1519   NITRITE NEGATIVE 05/17/2016 1727   LEUKOCYTESUR NEGATIVE 05/17/2016 1727    Radiological Exams on Admission: Dg Chest Portable 1 View  Result Date: 05/17/2016 CLINICAL DATA:  Status post intubation. EXAM: PORTABLE CHEST 1 VIEW COMPARISON:  05/17/16 FINDINGS: ET tube tip is above the carina. There is a nasogastric tube with tip below the field of view. Cardiac enlargement noted. Small pleural effusions are identified. Pulmonary vascular congestion. IMPRESSION: 1. Satisfactory position of ET tube with tip above the carina. 2. Mild CHF. Electronically Signed   By: Kerby Moors M.D.   On: 05/17/2016 18:31   Dg Chest Portable 1 View  Result Date: 05/17/2016 CLINICAL DATA:  Shortness of breath and altered mental status. EXAM: PORTABLE CHEST 1 VIEW COMPARISON:  02/20/2016 FINDINGS: Cardiomegaly and mild pulmonary vascular congestion noted. Mild peribronchial thickening is unchanged. The left retrocardiac region is difficult to evaluate on this study. No definite effusion or pneumothorax noted. IMPRESSION: Cardiomegaly with mild pulmonary vascular congestion. Left retrocardiac region difficult to evaluate on this study. Electronically Signed   By: Margarette Canada M.D.   On: 05/17/2016 17:48   Old chart reviewed Case discussed with dr Wilson Singer on 2 different occasions  Assessment/Plan 70 yo male with  acute encephalopathy, sob, resp failure  requiring intubation  Principal Problem:   Acute respiratory failure with hypoxia and hypercarbia ([place])-  Repeat abg now.  Doubt mental status changes were due to hypoglycemia as these changes began prior to hypoglycemia.  Will cover empirically with abx, lactic acid level is normal.  Pt has gotten hypotensive after intubation and proprofol  Changing propofol drip to fentanyl gtt.  Also adding on bnp, along with procalcitonin levels.   Active Problems:   Acute encephalopathy- likely multifactorial   Hypoglycemia- glucose being checked q 30 min on d10 drip   Chronic respiratory failure 02 dep with hypercarbia   COPD (chronic obstructive pulmonary disease) (HCC)   Paranoid schizophrenia ([place])   CAD (coronary artery disease)   DM (diabetes mellitus) ([place])   Morbid obesity (HCC)   Obesity hypoventilation syndrome ([place])   Non compliance with medical treatment   Family wishes transfer to .  I have notified dr Wilson Singer of family wishes.  He will call PCCM at [facility].   Code Status:   full Family Communication: spoke to wife ms deborah Dsouza who wishes patient to be transferred to [facility] for critical care services   Stryker Veasey A MD [facility]  If 7PM-7AM, please contact night-coverage [email or web address] Password [email or web address]  05/17/2016, 9:10 PM

## 2016-05-17 NOTE — Progress Notes (Signed)
Pharmacy Antibiotic Note  Rick Cruz is a 70 y.o. male admitted on 05/17/2016 with sepsis.  Pharmacy has been consulted for vancomycin and cefepime dosing.  Plan: Vanc 2 gm load then 1 gm IV q12 hours Cefepime 2 gm IV q8 hours F/u renal function, cultures and clinical course     Temp (24hrs), Avg:96.8 F (36 C), Min:96.8 F (36 C), Max:96.8 F (36 C)   Recent Labs Lab 05/17/16 1748 05/17/16 2018  WBC 18.5*  --   CREATININE 1.00  --   LATICACIDVEN  --  1.39    CrCl cannot be calculated (Unknown ideal weight.).    Allergies  Allergen Reactions  . Phenergan [Promethazine Hcl] Other (See Comments)    Becomes very confused and aggressive  . Haldol [Haloperidol Lactate] Other (See Comments)    Shaking   . Metformin And Related Diarrhea    Antimicrobials this admission: vanc 8/31 >>  cefepime 8/31 >>    Thank you for allowing pharmacy to be a part of this patient's care.  Excell Seltzer Poteet 05/17/2016 9:12 PM

## 2016-05-18 ENCOUNTER — Inpatient Hospital Stay (HOSPITAL_COMMUNITY): Payer: PPO

## 2016-05-18 ENCOUNTER — Encounter (HOSPITAL_COMMUNITY): Payer: Self-pay

## 2016-05-18 DIAGNOSIS — Z79899 Other long term (current) drug therapy: Secondary | ICD-10-CM | POA: Diagnosis not present

## 2016-05-18 DIAGNOSIS — T383X1A Poisoning by insulin and oral hypoglycemic [antidiabetic] drugs, accidental (unintentional), initial encounter: Secondary | ICD-10-CM | POA: Diagnosis not present

## 2016-05-18 DIAGNOSIS — E662 Morbid (severe) obesity with alveolar hypoventilation: Secondary | ICD-10-CM | POA: Diagnosis not present

## 2016-05-18 DIAGNOSIS — J9600 Acute respiratory failure, unspecified whether with hypoxia or hypercapnia: Secondary | ICD-10-CM | POA: Diagnosis not present

## 2016-05-18 DIAGNOSIS — H5441 Blindness, right eye, normal vision left eye: Secondary | ICD-10-CM | POA: Diagnosis not present

## 2016-05-18 DIAGNOSIS — R579 Shock, unspecified: Secondary | ICD-10-CM | POA: Diagnosis not present

## 2016-05-18 DIAGNOSIS — J9602 Acute respiratory failure with hypercapnia: Secondary | ICD-10-CM | POA: Diagnosis not present

## 2016-05-18 DIAGNOSIS — Z452 Encounter for adjustment and management of vascular access device: Secondary | ICD-10-CM | POA: Diagnosis not present

## 2016-05-18 DIAGNOSIS — R4182 Altered mental status, unspecified: Secondary | ICD-10-CM | POA: Diagnosis not present

## 2016-05-18 DIAGNOSIS — R1310 Dysphagia, unspecified: Secondary | ICD-10-CM | POA: Diagnosis not present

## 2016-05-18 DIAGNOSIS — E11649 Type 2 diabetes mellitus with hypoglycemia without coma: Secondary | ICD-10-CM | POA: Diagnosis not present

## 2016-05-18 DIAGNOSIS — J9601 Acute respiratory failure with hypoxia: Secondary | ICD-10-CM | POA: Diagnosis not present

## 2016-05-18 DIAGNOSIS — Z794 Long term (current) use of insulin: Secondary | ICD-10-CM | POA: Diagnosis not present

## 2016-05-18 DIAGNOSIS — E876 Hypokalemia: Secondary | ICD-10-CM | POA: Diagnosis not present

## 2016-05-18 DIAGNOSIS — N179 Acute kidney failure, unspecified: Secondary | ICD-10-CM | POA: Diagnosis not present

## 2016-05-18 DIAGNOSIS — G4733 Obstructive sleep apnea (adult) (pediatric): Secondary | ICD-10-CM | POA: Diagnosis not present

## 2016-05-18 DIAGNOSIS — E162 Hypoglycemia, unspecified: Secondary | ICD-10-CM | POA: Diagnosis not present

## 2016-05-18 DIAGNOSIS — I5032 Chronic diastolic (congestive) heart failure: Secondary | ICD-10-CM | POA: Diagnosis not present

## 2016-05-18 DIAGNOSIS — F200 Paranoid schizophrenia: Secondary | ICD-10-CM | POA: Diagnosis not present

## 2016-05-18 DIAGNOSIS — J9622 Acute and chronic respiratory failure with hypercapnia: Secondary | ICD-10-CM | POA: Diagnosis not present

## 2016-05-18 DIAGNOSIS — S82843A Displaced bimalleolar fracture of unspecified lower leg, initial encounter for closed fracture: Secondary | ICD-10-CM | POA: Diagnosis not present

## 2016-05-18 DIAGNOSIS — G9341 Metabolic encephalopathy: Secondary | ICD-10-CM | POA: Diagnosis not present

## 2016-05-18 DIAGNOSIS — A419 Sepsis, unspecified organism: Secondary | ICD-10-CM | POA: Diagnosis not present

## 2016-05-18 DIAGNOSIS — G9340 Encephalopathy, unspecified: Secondary | ICD-10-CM | POA: Diagnosis not present

## 2016-05-18 DIAGNOSIS — Z4682 Encounter for fitting and adjustment of non-vascular catheter: Secondary | ICD-10-CM | POA: Diagnosis not present

## 2016-05-18 DIAGNOSIS — J9621 Acute and chronic respiratory failure with hypoxia: Secondary | ICD-10-CM | POA: Diagnosis not present

## 2016-05-18 DIAGNOSIS — R0902 Hypoxemia: Secondary | ICD-10-CM | POA: Diagnosis not present

## 2016-05-18 DIAGNOSIS — E1165 Type 2 diabetes mellitus with hyperglycemia: Secondary | ICD-10-CM | POA: Diagnosis not present

## 2016-05-18 DIAGNOSIS — J449 Chronic obstructive pulmonary disease, unspecified: Secondary | ICD-10-CM | POA: Diagnosis not present

## 2016-05-18 DIAGNOSIS — J690 Pneumonitis due to inhalation of food and vomit: Secondary | ICD-10-CM | POA: Diagnosis not present

## 2016-05-18 DIAGNOSIS — Z7982 Long term (current) use of aspirin: Secondary | ICD-10-CM | POA: Diagnosis not present

## 2016-05-18 DIAGNOSIS — Z9981 Dependence on supplemental oxygen: Secondary | ICD-10-CM | POA: Diagnosis not present

## 2016-05-18 DIAGNOSIS — Z6841 Body Mass Index (BMI) 40.0 and over, adult: Secondary | ICD-10-CM | POA: Diagnosis not present

## 2016-05-18 DIAGNOSIS — Z87891 Personal history of nicotine dependence: Secondary | ICD-10-CM | POA: Diagnosis not present

## 2016-05-18 LAB — RENAL FUNCTION PANEL
ALBUMIN: 2.5 g/dL — AB (ref 3.5–5.0)
Anion gap: 5 (ref 5–15)
BUN: 10 mg/dL (ref 6–20)
CHLORIDE: 93 mmol/L — AB (ref 101–111)
CO2: 35 mmol/L — ABNORMAL HIGH (ref 22–32)
CREATININE: 1.33 mg/dL — AB (ref 0.61–1.24)
Calcium: 7.3 mg/dL — ABNORMAL LOW (ref 8.9–10.3)
GFR, EST NON AFRICAN AMERICAN: 53 mL/min — AB (ref >60)
Glucose, Bld: 338 mg/dL — ABNORMAL HIGH (ref 65–99)
Potassium: 5.1 mmol/L (ref 3.5–5.1)
Sodium: 133 mmol/L — ABNORMAL LOW (ref 135–145)

## 2016-05-18 LAB — BASIC METABOLIC PANEL
ANION GAP: 5 (ref 5–15)
BUN: 10 mg/dL (ref 6–20)
CO2: 35 mmol/L — ABNORMAL HIGH (ref 22–32)
Calcium: 7.3 mg/dL — ABNORMAL LOW (ref 8.9–10.3)
Chloride: 92 mmol/L — ABNORMAL LOW (ref 101–111)
Creatinine, Ser: 1.35 mg/dL — ABNORMAL HIGH (ref 0.61–1.24)
GFR calc Af Amer: >60 mL/min (ref >60)
GFR, EST NON AFRICAN AMERICAN: 52 mL/min — AB (ref >60)
Glucose, Bld: 340 mg/dL — ABNORMAL HIGH (ref 65–99)
POTASSIUM: 5.1 mmol/L (ref 3.5–5.1)
SODIUM: 132 mmol/L — AB (ref 135–145)

## 2016-05-18 LAB — CBC WITH DIFFERENTIAL/PLATELET
BASOS ABS: 0 10*3/uL (ref 0.0–0.1)
Basophils Relative: 0 %
EOS ABS: 0 10*3/uL (ref 0.0–0.7)
EOS PCT: 0 %
HCT: 34.2 % — ABNORMAL LOW (ref 39.0–52.0)
Hemoglobin: 9.5 g/dL — ABNORMAL LOW (ref 13.0–17.0)
LYMPHS ABS: 1.4 10*3/uL (ref 0.7–4.0)
Lymphocytes Relative: 5 %
MCH: 26.3 pg (ref 26.0–34.0)
MCHC: 27.8 g/dL — ABNORMAL LOW (ref 30.0–36.0)
MCV: 94.7 fL (ref 78.0–100.0)
MONO ABS: 1.4 10*3/uL — AB (ref 0.1–1.0)
Monocytes Relative: 5 %
NEUTROS PCT: 90 %
Neutro Abs: 24.2 10*3/uL — ABNORMAL HIGH (ref 1.7–7.7)
PLATELETS: 179 10*3/uL (ref 150–400)
RBC: 3.61 MIL/uL — AB (ref 4.22–5.81)
RDW: 13.6 % (ref 11.5–15.5)
WBC: 27 10*3/uL — AB (ref 4.0–10.5)

## 2016-05-18 LAB — POCT I-STAT 3, ART BLOOD GAS (G3+)
ACID-BASE EXCESS: 10 mmol/L — AB (ref 0.0–2.0)
ACID-BASE EXCESS: 12 mmol/L — AB (ref 0.0–2.0)
ACID-BASE EXCESS: 15 mmol/L — AB (ref 0.0–2.0)
BICARBONATE: 38.4 mmol/L — AB (ref 20.0–28.0)
BICARBONATE: 42.9 mmol/L — AB (ref 20.0–28.0)
Bicarbonate: 38.7 mmol/L — ABNORMAL HIGH (ref 20.0–28.0)
O2 SAT: 90 %
O2 Saturation: 88 %
O2 Saturation: 39 %
PO2 ART: 68 mmHg — AB (ref 83.0–108.0)
PO2 ART: 70 mmHg — AB (ref 83.0–108.0)
Patient temperature: 38.7
Patient temperature: 39
TCO2: 41 mmol/L (ref 0–100)
TCO2: 40 mmol/L (ref 0–100)
TCO2: 45 mmol/L (ref 0–100)
pCO2 arterial: 75.3 mmHg (ref 32.0–48.0)
pCO2 arterial: 66.7 mmHg (ref 32.0–48.0)
pCO2 arterial: 78.1 mmHg (ref 32.0–48.0)
pH, Arterial: 7.327 — ABNORMAL LOW (ref 7.350–7.450)
pH, Arterial: 7.357 (ref 7.350–7.450)
pH, Arterial: 7.377 (ref 7.350–7.450)
pO2, Arterial: 28 mmHg — CL (ref 83.0–108.0)

## 2016-05-18 LAB — GLUCOSE, CAPILLARY
GLUCOSE-CAPILLARY: 247 mg/dL — AB (ref 65–99)
GLUCOSE-CAPILLARY: 289 mg/dL — AB (ref 65–99)
GLUCOSE-CAPILLARY: 303 mg/dL — AB (ref 65–99)
Glucose-Capillary: 171 mg/dL — ABNORMAL HIGH (ref 65–99)
Glucose-Capillary: 313 mg/dL — ABNORMAL HIGH (ref 65–99)
Glucose-Capillary: 328 mg/dL — ABNORMAL HIGH (ref 65–99)
Glucose-Capillary: 302 mg/dL — ABNORMAL HIGH (ref 65–99)

## 2016-05-18 LAB — TROPONIN I: TROPONIN I: 0.03 ng/mL — AB (ref <0.03)

## 2016-05-18 LAB — APTT: APTT: 22 s — AB (ref 24–36)

## 2016-05-18 LAB — CK: Total CK: 51 U/L (ref 49–397)

## 2016-05-18 LAB — PROTIME-INR
INR: 1.21
PROTHROMBIN TIME: 15.3 s — AB (ref 11.4–15.2)

## 2016-05-18 LAB — CORTISOL: CORTISOL PLASMA: 16.7 ug/dL

## 2016-05-18 LAB — TYPE AND SCREEN
ABO/RH(D): A POS
ANTIBODY SCREEN: NEGATIVE

## 2016-05-18 LAB — PHOSPHORUS: PHOSPHORUS: 3.1 mg/dL (ref 2.5–4.6)

## 2016-05-18 LAB — LACTIC ACID, PLASMA
LACTIC ACID, VENOUS: 2.7 mmol/L — AB (ref 0.5–1.9)
LACTIC ACID, VENOUS: 1.4 mmol/L (ref 0.5–1.9)

## 2016-05-18 LAB — ABO/RH: ABO/RH(D): A POS

## 2016-05-18 LAB — MAGNESIUM
MAGNESIUM: 1.1 mg/dL — AB (ref 1.7–2.4)
MAGNESIUM: 1.2 mg/dL — AB (ref 1.7–2.4)

## 2016-05-18 LAB — MRSA PCR SCREENING: MRSA BY PCR: NEGATIVE

## 2016-05-18 LAB — OSMOLALITY: Osmolality: 294 mOsm/kg (ref 275–295)

## 2016-05-18 LAB — PROCALCITONIN: PROCALCITONIN: 6.5 ng/mL

## 2016-05-18 MED ORDER — DEXTROSE 5 % IV SOLN
30.0000 ug/min | INTRAVENOUS | Status: DC
  Administered 2016-05-18 (×2): 70 ug/min via INTRAVENOUS
  Administered 2016-05-18: 60 ug/min via INTRAVENOUS
  Administered 2016-05-18: 80 ug/min via INTRAVENOUS
  Administered 2016-05-18: 30 ug/min via INTRAVENOUS
  Filled 2016-05-18 (×6): qty 1

## 2016-05-18 MED ORDER — ETOMIDATE 2 MG/ML IV SOLN
40.0000 mg | Freq: Once | INTRAVENOUS | Status: AC
  Administered 2016-05-18: 40 mg via INTRAVENOUS

## 2016-05-18 MED ORDER — SODIUM PHOSPHATES 45 MMOLE/15ML IV SOLN
20.0000 mmol | Freq: Once | INTRAVENOUS | Status: DC
  Filled 2016-05-18: qty 6.67

## 2016-05-18 MED ORDER — ORAL CARE MOUTH RINSE
15.0000 mL | Freq: Four times a day (QID) | OROMUCOSAL | Status: DC
  Administered 2016-05-18 – 2016-05-29 (×33): 15 mL via OROMUCOSAL

## 2016-05-18 MED ORDER — SODIUM CHLORIDE 0.9 % IV SOLN
25.0000 ug/h | INTRAVENOUS | Status: DC
  Administered 2016-05-18: 50 ug/h via INTRAVENOUS
  Administered 2016-05-18 – 2016-05-21 (×5): 200 ug/h via INTRAVENOUS
  Administered 2016-05-21: 300 ug/h via INTRAVENOUS
  Administered 2016-05-21: 200 ug/h via INTRAVENOUS
  Administered 2016-05-22: 300 ug/h via INTRAVENOUS
  Filled 2016-05-18 (×9): qty 50

## 2016-05-18 MED ORDER — PROPOFOL 1000 MG/100ML IV EMUL
INTRAVENOUS | Status: AC
  Filled 2016-05-18: qty 100

## 2016-05-18 MED ORDER — VITAL HIGH PROTEIN PO LIQD: 1000.0000 mL | ORAL | Status: DC

## 2016-05-18 MED ORDER — MIDAZOLAM HCL 2 MG/2ML IJ SOLN
INTRAMUSCULAR | Status: AC
  Filled 2016-05-18: qty 4

## 2016-05-18 MED ORDER — INSULIN ASPART 100 UNIT/ML ~~LOC~~ SOLN
0.0000 [IU] | SUBCUTANEOUS | Status: DC
  Administered 2016-05-18 (×2): 7 [IU] via SUBCUTANEOUS
  Administered 2016-05-18: 3 [IU] via SUBCUTANEOUS
  Administered 2016-05-18 – 2016-05-19 (×3): 7 [IU] via SUBCUTANEOUS
  Administered 2016-05-19: 9 [IU] via SUBCUTANEOUS
  Administered 2016-05-19 – 2016-05-20 (×6): 5 [IU] via SUBCUTANEOUS

## 2016-05-18 MED ORDER — PROPOFOL 1000 MG/100ML IV EMUL
0.0000 ug/kg/min | INTRAVENOUS | Status: DC
  Administered 2016-05-18: 10 ug/kg/min via INTRAVENOUS

## 2016-05-18 MED ORDER — SODIUM CHLORIDE 0.9 % IV BOLUS (SEPSIS): 500.0000 mL | Freq: Once | INTRAVENOUS | Status: DC

## 2016-05-18 MED ORDER — MAGNESIUM SULFATE 4 GM/100ML IV SOLN
4.0000 g | Freq: Once | INTRAVENOUS | Status: AC
  Administered 2016-05-18: 4 g via INTRAVENOUS
  Filled 2016-05-18: qty 100

## 2016-05-18 MED ORDER — SODIUM CHLORIDE 0.9 % IV SOLN
INTRAVENOUS | Status: DC
  Administered 2016-05-18 (×2): via INTRAVENOUS

## 2016-05-18 MED ORDER — FENTANYL CITRATE (PF) 100 MCG/2ML IJ SOLN
INTRAMUSCULAR | Status: AC
  Filled 2016-05-18: qty 2

## 2016-05-18 MED ORDER — VITAL HIGH PROTEIN PO LIQD
1000.0000 mL | ORAL | Status: DC
  Administered 2016-05-18 – 2016-05-22 (×6): 1000 mL

## 2016-05-18 MED ORDER — ROCURONIUM BROMIDE 50 MG/5ML IV SOLN
80.0000 mg | Freq: Once | INTRAVENOUS | Status: AC
  Administered 2016-05-18: 80 mg via INTRAVENOUS

## 2016-05-18 MED ORDER — FENTANYL BOLUS VIA INFUSION
25.0000 ug | INTRAVENOUS | Status: DC | PRN
  Administered 2016-05-18: 25 ug via INTRAVENOUS
  Administered 2016-05-18: 100 ug via INTRAVENOUS
  Administered 2016-05-19: 25 ug via INTRAVENOUS
  Filled 2016-05-18: qty 25

## 2016-05-18 MED ORDER — FAMOTIDINE IN NACL 20-0.9 MG/50ML-% IV SOLN
20.0000 mg | Freq: Two times a day (BID) | INTRAVENOUS | Status: DC
  Administered 2016-05-18 – 2016-05-25 (×15): 20 mg via INTRAVENOUS
  Filled 2016-05-18 (×15): qty 50

## 2016-05-18 MED ORDER — ONDANSETRON HCL 4 MG/2ML IJ SOLN
4.0000 mg | Freq: Four times a day (QID) | INTRAMUSCULAR | Status: DC | PRN
  Filled 2016-05-18: qty 2

## 2016-05-18 MED ORDER — SODIUM CHLORIDE 0.9 % IV BOLUS (SEPSIS)
500.0000 mL | Freq: Once | INTRAVENOUS | Status: AC
  Administered 2016-05-18: 500 mL via INTRAVENOUS

## 2016-05-18 MED ORDER — SODIUM CHLORIDE 0.9 % IV BOLUS (SEPSIS)
1000.0000 mL | Freq: Once | INTRAVENOUS | Status: AC
  Administered 2016-05-18: 1000 mL via INTRAVENOUS

## 2016-05-18 MED ORDER — CHLORHEXIDINE GLUCONATE 0.12 % MT SOLN
15.0000 mL | Freq: Two times a day (BID) | OROMUCOSAL | Status: DC
  Administered 2016-05-18 – 2016-05-29 (×20): 15 mL via OROMUCOSAL
  Filled 2016-05-18 (×9): qty 15

## 2016-05-18 MED ORDER — SODIUM PHOSPHATES 45 MMOLE/15ML IV SOLN
30.0000 mmol | Freq: Once | INTRAVENOUS | Status: AC
  Administered 2016-05-18: 30 mmol via INTRAVENOUS
  Filled 2016-05-18: qty 10

## 2016-05-18 MED ORDER — SODIUM CHLORIDE 0.9 % IV SOLN
250.0000 mL | INTRAVENOUS | Status: DC | PRN
  Administered 2016-05-25: 250 mL via INTRAVENOUS

## 2016-05-18 MED ORDER — PRO-STAT SUGAR FREE PO LIQD
30.0000 mL | Freq: Two times a day (BID) | ORAL | Status: DC
  Administered 2016-05-18 – 2016-05-21 (×8): 30 mL
  Filled 2016-05-18 (×10): qty 30

## 2016-05-18 MED ORDER — HEPARIN SODIUM (PORCINE) 5000 UNIT/ML IJ SOLN
5000.0000 [IU] | Freq: Three times a day (TID) | INTRAMUSCULAR | Status: DC
  Administered 2016-05-18 – 2016-05-23 (×15): 5000 [IU] via SUBCUTANEOUS
  Filled 2016-05-18 (×16): qty 1

## 2016-05-18 MED ORDER — ACETAMINOPHEN 160 MG/5ML PO SOLN
650.0000 mg | Freq: Four times a day (QID) | ORAL | Status: DC | PRN
  Administered 2016-05-18 – 2016-05-21 (×3): 650 mg via ORAL
  Filled 2016-05-18 (×3): qty 20.3

## 2016-05-18 MED ORDER — FENTANYL CITRATE (PF) 100 MCG/2ML IJ SOLN
25.0000 ug | INTRAMUSCULAR | Status: DC | PRN
Start: 2016-05-18 — End: 2016-05-18
  Administered 2016-05-18: 50 ug via INTRAVENOUS
  Filled 2016-05-18: qty 2

## 2016-05-18 MED ORDER — POTASSIUM CHLORIDE 10 MEQ/100ML IV SOLN
INTRAVENOUS | Status: AC
  Administered 2016-05-18: 10 meq
  Filled 2016-05-18: qty 100

## 2016-05-18 MED ORDER — IPRATROPIUM-ALBUTEROL 0.5-2.5 (3) MG/3ML IN SOLN
3.0000 mL | Freq: Four times a day (QID) | RESPIRATORY_TRACT | Status: DC
  Administered 2016-05-18 – 2016-05-19 (×7): 3 mL via RESPIRATORY_TRACT
  Filled 2016-05-18 (×6): qty 3

## 2016-05-18 MED ORDER — FENTANYL CITRATE (PF) 100 MCG/2ML IJ SOLN
50.0000 ug | Freq: Once | INTRAMUSCULAR | Status: DC
Start: 2016-05-18 — End: 2016-05-22

## 2016-05-18 MED ORDER — DEXTROSE 5 % IV SOLN
500.0000 mg | Freq: Every day | INTRAVENOUS | Status: DC
  Administered 2016-05-18: 500 mg via INTRAVENOUS
  Filled 2016-05-18 (×2): qty 500

## 2016-05-18 MED ORDER — MIDAZOLAM HCL 2 MG/2ML IJ SOLN
1.0000 mg | INTRAMUSCULAR | Status: DC | PRN
  Administered 2016-05-18: 2 mg via INTRAVENOUS
  Administered 2016-05-18: 4 mg via INTRAVENOUS
  Administered 2016-05-20 – 2016-05-21 (×4): 2 mg via INTRAVENOUS
  Administered 2016-05-21 – 2016-05-22 (×5): 4 mg via INTRAVENOUS
  Filled 2016-05-18: qty 2
  Filled 2016-05-18 (×2): qty 4
  Filled 2016-05-18: qty 2
  Filled 2016-05-18 (×4): qty 4
  Filled 2016-05-18 (×3): qty 2

## 2016-05-18 MED ORDER — IPRATROPIUM-ALBUTEROL 0.5-2.5 (3) MG/3ML IN SOLN
RESPIRATORY_TRACT | Status: AC
  Filled 2016-05-18: qty 3

## 2016-05-18 MED FILL — Morphine Sulfate Inj 4 MG/ML: INTRAMUSCULAR | Qty: 1 | Status: AC

## 2016-05-18 NOTE — Progress Notes (Signed)
[place] Progress Note Patient Name: Rick Cruz DOB: May 16, 1946 MRN: [id number]   Date of Service  05/18/2016  HPI/Events of Note  hyperglyecemic on D10 infusion  eICU Interventions  Stop D10     Intervention Category Intermediate Interventions: Hyperglycemia - evaluation and treatment  Simonne Maffucci 05/18/2016, 8:13 PM

## 2016-05-18 NOTE — Progress Notes (Signed)
Patient intubated once on unit.  40mg  of Amidate and 80 mg of Rocuronium given DR. Nestor and [employer] at bedside. Vital signs stable throughout. WIil continue to monitor patient

## 2016-05-18 NOTE — Progress Notes (Signed)
[place] Progress Note Patient Name: Rick Cruz DOB: [birth year] MRN: [id number]   Date of Service  05/18/2016  HPI/Events of Note  Hypomag  eICU Interventions  Mag replaced     Intervention Category Intermediate Interventions: Electrolyte abnormality - evaluation and management  DETERDING,ELIZABETH 05/18/2016, 10:36 PM

## 2016-05-18 NOTE — Progress Notes (Signed)
CRITICAL VALUE ALERT  Critical value received:  Lactic Acid 2.7, Phos > 1.0, Troponin 0.03  Date of notification:  05/18/16  Time of notification:  1:34 PM  Critical value read back:Yes.    Nurse who received alert:  Nettie Elm, RN   MD notified (1st page):  Dr. Titus Mould  Time of first page:  1336  MD notified (2nd page):  Time of second page:  Responding MD:  Dr. Nelda Marseille  Time MD responded:  1430

## 2016-05-18 NOTE — Progress Notes (Signed)
On initial shift assessment of patient, I noticed that the patient's central line dressing was coming off and the line itself was not properly sutured in place.  [facility] Dr. notified.  Will continue to monitor patient.

## 2016-05-18 NOTE — H&P (Addendum)
PULMONARY / CRITICAL CARE MEDICINE   Name: Rick Cruz MRN: [id number] DOB: 05/21/1946    ADMISSION DATE:  05/17/2016 CONSULTATION DATE:  05/17/2016  REFERRING MD:  Dr Shanon Brow [name]  CHIEF COMPLAINT:  AMS  HISTORY OF PRESENT ILLNESS:   70 year old male with past medical history as below, which is significant for CHF, OSA on home O2 (he had a sleep study 2016 recommending bi-level 13/7 which he reportedly does not use), COPD, diabetes, schizophrenia, and blindness of the right eye. He presented to [facility] emergency department 8/31 with confusion. Symptoms started at home where she checked his blood sugar and found to be greater than 300. She called 911 and administered 30 units of insulin. Mental status was then worsened, and repeat glucose in the emergency department was 19. He was administered D50 and Narcan, however, he continued to deteriorate complaining of shortness of breath and was eventually intubated. Plan was to admit the patient for hypoxic/hypercarbic respiratory failure, however, daily requested transfer to [facility] Prior to transfer he apparently improved and was extubated in the emergency department. He was placed on BiPAP.  Of note, he has been admitted several times with similar complaints in the past. He does have a history of altered mental status admissions for various reasons including polypharmacy specifically benzodiazepine overdose, hyponatremia, OSA/OHS decompensation, sepsis, and hypoglycemia.   PAST MEDICAL HISTORY :  He  has a past medical history of Blind right eye; CHF (congestive heart failure) ([place]); Chronic pain; COPD (chronic obstructive pulmonary disease) ([place]); Depression; Diabetes mellitus without complication ([place]); Hypercholesterolemia; On home O2; Schizophrenia ([place]); Sleep apnea; and Tobacco abuse.  PAST SURGICAL HISTORY: He  has a past surgical history that includes Knee surgery; Colon surgery (March 2010); Colonoscopy (July  2011); and Esophagogastroduodenoscopy (egd) with propofol (N/A, 04/20/2013).  Allergies  Allergen Reactions  . Phenergan [Promethazine Hcl] Other (See Comments)    Becomes very confused and aggressive  . Haldol [Haloperidol Lactate] Other (See Comments)    Shaking   . Metformin And Related Diarrhea    No current facility-administered medications on file prior to encounter.    Current Outpatient Prescriptions on File Prior to Encounter  Medication Sig  . clopidogrel (PLAVIX) 75 MG tablet Take 75 mg by mouth every morning.  . furosemide (LASIX) 40 MG tablet Take 1 tablet (40 mg total) by mouth daily.  Marland Kitchen gabapentin (NEURONTIN) 300 MG capsule Take 300 mg by mouth 4 (four) times daily.   . simvastatin (ZOCOR) 40 MG tablet Take 40 mg by mouth every evening.  . sodium chloride 1 G tablet Take 2 g by mouth 2 (two) times daily.   Marland Kitchen terazosin (HYTRIN) 1 MG capsule Take 1 mg by mouth 2 (two) times daily.   . traZODone (DESYREL) 150 MG tablet Take 150 mg by mouth at bedtime.   . ziprasidone (GEODON) 80 MG capsule Take 80 mg by mouth 2 (two) times daily with a meal.   . albuterol (PROVENTIL HFA;VENTOLIN HFA) 108 (90 BASE) MCG/ACT inhaler Inhale 2 puffs into the lungs every 6 (six) hours as needed for wheezing or shortness of breath.  Marland Kitchen albuterol (PROVENTIL) (2.5 MG/3ML) 0.083% nebulizer solution Take 3 mLs (2.5 mg total) by nebulization every 6 (six) hours as needed for wheezing or shortness of breath.  Marland Kitchen amoxicillin-clavulanate (AUGMENTIN) 875-125 MG tablet Take 1 tablet by mouth every 12 (twelve) hours.  Marland Kitchen aspirin 81 MG chewable tablet Chew 81 mg by mouth every morning.   . cholecalciferol (VITAMIN  D) 1000 UNITS tablet Take 2,000 Units by mouth daily.  . Cinnamon 500 MG capsule Take 1,000 mg by mouth every morning.   . citalopram (CELEXA) 20 MG tablet Take 20 mg by mouth every morning.  Marland Kitchen HYDROcodone-acetaminophen (NORCO/VICODIN) 5-325 MG per tablet Take 1 tablet by mouth every 4 (four) hours as  needed for moderate pain.   . Insulin Human (INSULIN PUMP) SOLN Inject into the skin continuous. Humalog  . pantoprazole (PROTONIX) 40 MG tablet Take 40 mg by mouth 2 (two) times daily.  . predniSONE (DELTASONE) 10 MG tablet Take 40mg  po daily for 2 days then 30mg  daily for 2 days then 20mg  daily for 2 days then 10mg  daily for 2 days then stop  . Tetrahydrozoline HCl (VISINE OP) Apply 1 drop to eye daily as needed (irritation).   Marland Kitchen tiotropium (SPIRIVA HANDIHALER) 18 MCG inhalation capsule Place 1 capsule (18 mcg total) into inhaler and inhale every morning.  . triamcinolone cream (KENALOG) 0.1 % Apply 1 application topically 2 (two) times daily as needed (for irritation).     FAMILY HISTORY:  His   SOCIAL HISTORY: He  reports that he quit smoking about 2 years ago. His smoking use included Cigarettes. He has a 50.00 pack-year smoking history. He has never used smokeless tobacco. He reports that he does not drink alcohol or use drugs.  REVIEW OF SYSTEMS:   Unable as patient is encephalopathic  SUBJECTIVE:    VITAL SIGNS: BP (!) 136/109   Pulse 117   Temp 100.6 F (38.1 C)   Resp 20   SpO2 95%   HEMODYNAMICS:    VENTILATOR SETTINGS: Vent Mode: BIPAP FiO2 (%):  [70 %-100 %] 70 % Set Rate:  [16 bmp-18 bmp] 18 bmp Vt Set:  [500 mL] 500 mL PEEP:  [5 cmH20] 5 cmH20 Plateau Pressure:  [23 cmH20-26 cmH20] 24 cmH20  INTAKE / OUTPUT: No intake/output data recorded.  PHYSICAL EXAMINATION: General:  Morbidly obese male in NAD on BiPAP Neuro:  Obtunded, minimally arouses, but is unable to follow commands or cooperate with exam. HEENT:  [place]/AT, PERRL, no JVD Cardiovascular:  RRR, no MRG Lungs:  Distant breath sounds, non wheeze, prolonged exp. phase Abdomen:  Rotund, soft, non-distended Musculoskeletal:  No acute deformity Skin:  Grossly intact, rash/dry skin to suprasternal chest.   LABS:  BMET  Recent Labs Lab 05/17/16 1748  NA 143  K 3.2*  CL 93*  CO2 41*  BUN 10   CREATININE 1.00  GLUCOSE <20*    Electrolytes  Recent Labs Lab 05/17/16 1748  CALCIUM 8.8*  MG 1.8    CBC  Recent Labs Lab 05/17/16 1748  WBC 18.5*  HGB 11.9*  HCT 42.5  PLT 209    Coag's No results for input(s): APTT, INR in the last 168 hours.  Sepsis Markers  Recent Labs Lab 05/17/16 1748 05/17/16 2018  LATICACIDVEN  --  1.39  PROCALCITON <0.10  --     ABG  Recent Labs Lab 05/17/16 1726 05/17/16 2210  PHART 7.294* 7.235*  PCO2ART 87.6* 105*  PO2ART 107.0 63.5*    Liver Enzymes  Recent Labs Lab 05/17/16 1748  AST 15  ALT 9*  ALKPHOS 64  BILITOT 0.2*  ALBUMIN 3.9    Cardiac Enzymes  Recent Labs Lab 05/17/16 1748  TROPONINI <0.03    Glucose  Recent Labs Lab 05/17/16 1734 05/17/16 1844 05/17/16 1922 05/17/16 2006 05/17/16 2228 05/17/16 2339  GLUCAP 200* 38* 55* 75 40* 168*  Imaging Dg Chest Portable 1 View  Result Date: 05/17/2016 CLINICAL DATA:  Status post intubation. EXAM: PORTABLE CHEST 1 VIEW COMPARISON:  05/17/16 FINDINGS: ET tube tip is above the carina. There is a nasogastric tube with tip below the field of view. Cardiac enlargement noted. Small pleural effusions are identified. Pulmonary vascular congestion. IMPRESSION: 1. Satisfactory position of ET tube with tip above the carina. 2. Mild CHF. Electronically Signed   By: Kerby Moors M.D.   On: 05/17/2016 18:31   Dg Chest Portable 1 View  Result Date: 05/17/2016 CLINICAL DATA:  Shortness of breath and altered mental status. EXAM: PORTABLE CHEST 1 VIEW COMPARISON:  02/20/2016 FINDINGS: Cardiomegaly and mild pulmonary vascular congestion noted. Mild peribronchial thickening is unchanged. The left retrocardiac region is difficult to evaluate on this study. No definite effusion or pneumothorax noted. IMPRESSION: Cardiomegaly with mild pulmonary vascular congestion. Left retrocardiac region difficult to evaluate on this study. Electronically Signed   By: Margarette Canada  M.D.   On: 05/17/2016 17:48     STUDIES:    CULTURES: BCx2 9/1 > Urine 9/1 > Tracheal aspirate 9/1>  ANTIBIOTICS: Cefepime 8/31 > Vancomycin 8/31 > Azithromycin 9/1 >   SIGNIFICANT EVENTS: 8/31 - Presented to OSH ED w/ altered mentation 9/01 - Transfer to [facility] at family request  LINES/TUBES: ETT 8/31, 9/1 >>>  DISCUSSION: 70 year old male with chronic respiratory failure presented 8/31 with decreased LOC. Found to by hypercarbic/hypoxemic requiring intubation. Concern for PNA superimposed on OSA/OHS. Treat with broad spectrum ABX. Close glucose monitoring. Supportive care on vent.   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 On home O2) OSA/OHS decompensation with central apnea/hypoventilation likely playing role with history of polypharmacy and opioids positive on UDS. No clinical response to narcan in ED. COPD without exacerbation  P:   STAT intubation Full vent support ABG 30 mins post intubation CXR to eval ETT Keep O2 sats 88-94% Scheduled Duoneb Defer steroids  CARDIOVASCULAR A:  Chronic diastolic CHF by echo [id number], could be some acute component contributing top current illness H/o HLD  P:  Telemetry monitoring EKG with no obvious ischemia Trend troponin, lactic Keep MAP > 65, currently maintaining on his own Hold PO home meds plavix, metoprolol, lasix, zocor, asa initially while NPO Echo  RENAL A:   Hypokalemia Acute renal risk  P:   KVO IVF Replace K per ED orders Monitor urine output  GASTROINTESTINAL A:   No acute issues  P:   NPO Pepcid BID for SUP  HEMATOLOGIC A:   Anemia, Hgb at baseline  P:  Follow CBC SQ heparin for VTE ppx  INFECTIOUS A: Probable Sepsis Probable RLL Pneumonia  P:   ABX as above PCT Follow panculture  ENDOCRINE A:   DM with insulin pump  P:   DC insulin pump CBG monitoring and SSI A1C Holding home  actos  NEUROLOGIC A:   Acute metabolic encephalopathy Schizophrenia  P:   RASS goal: -1 PRN fentanyl and versed Holding home gabapentin, trazadone, norco, geodon,    FAMILY  - Updates: No family present. Attempted to contact wife via phone but unsuccessful. Left a message to contact the ICU.  - Inter-disciplinary family meet or Palliative Care meeting due by:  9/8   Georgann Housekeeper, AGACNP-BC [place] [facility]/Critical Care Pager [phone] or [phone] 05/18/2016 2:21 AM  PCCM Attending Note: Patient seen and examined with nurse practitioner. Please refer to his  admission H&P which I have reviewed in detail. Interestingly patient's son passed away unexpectedly after a fall the day of presenting to the emergency department. Patient presented with reports of altered mental status at home with hyperglycemia. Patient was found to be hypoglycemic on repeat check. He was intubated for dyspnea and inability to protect airway in the emergency department. He was subsequently extubated prior to transport due to his combative nature and placed on BiPAP support. Patient was subsequently transferred on BiPAP support. While in Route patient received IV morphine again for combative nature. Review portable chest x-ray from previous intubation shows silhouetting of left hemidiaphragm and air bronchograms in right lower lung suggestive of pneumonia despite negative Procalcitonin. Patient is febrile and has a leukocytosis. No evidence of intentional ingestion with UDS positive only for opiates and a negative Tylenol and salicylate level. Patient's liver and renal function are also normal. Upon arrival patient very somnolent and requiring increasing BiPAP support for hypoxia and work of breathing. Emergently intubated for airway protection and respiratory support. We attempted to contact the patient's wife via phone but were unsuccessful. Continuing broad-spectrum antibiotics for now with the addition of  azithromycin and with scheduled bronchodilators. Obtaining cultures. Plan for repeat ABG and portable chest x-ray post intubation. Continuing to monitor electrolytes. Using intermittent sedatives and pain medication to prevent oversedation. Holding home gabapentin, trazodone, Norco, and Geodon. Checking transthoracic echocardiogram and trending cardiac biomarkers.   I spent a total of 33 minutes independent of time during procedures caring for the patient and reviewing the patient's electronic medical record.   Sonia Baller Ashok Cordia, M.D. [facility] Pulmonary & Critical Care Pager:  [phone] After 3pm or if no response, call [phone] 2:33 AM 05/18/16

## 2016-05-18 NOTE — Progress Notes (Signed)
Brief Nutrition Note  Consult received for enteral/tube feeding initiation and management.  Tube feeding rate adjusted per recommendations from full nutrition assessment completed earlier. Will continue to follow.  Admitting Dx: Hypercapnemia [R06.89] Hypoglycemia [E16.2] Acute encephalopathy [G93.40]  Body mass index is 40.36 kg/m. Pt meets criteria for obesity class III based on current BMI.  Labs:   Recent Labs Lab 05/17/16 1748  NA 143  K 3.2*  CL 93*  CO2 41*  BUN 10  CREATININE 1.00  CALCIUM 8.8*  MG 1.8  GLUCOSE <20*    Willey Blade, MS, RD, LDN

## 2016-05-18 NOTE — Progress Notes (Signed)
Inpatient Diabetes Program Recommendations  AACE/ADA: New Consensus Statement on Inpatient Glycemic Control (2015)  Target Ranges:  Prepandial:   less than 140 mg/dL      Peak postprandial:   less than 180 mg/dL (1-2 hours)      Critically ill patients:  140 - 180 mg/dL   Results for NAFTALI, BIELENBERG (MRN [id number]) as of 05/18/2016 14:16  Ref. Range 05/17/2016 23:39 05/18/2016 01:10 05/18/2016 04:14 05/18/2016 07:56 05/18/2016 12:01  Glucose-Capillary Latest Ref Range: 65 - 99 mg/dL 168 (H) 171 (H) 247 (H) 313 (H) 303 (H)    Review of Glycemic Control  Outpatient Diabetes medications: Insulin pump (pump not @ bedside) Current orders for Inpatient glycemic control: Novolog correction 0-9 q 4 hrs.  Inpatient Diabetes Program Recommendations:  Spoke with patient's mom and son. Mom states the pump is @ home with his wife and has not been working right. As of 08/18/15 basal rate was 96 units in a 24 hr period.  Please consider ICU Glycemic control orders while patient is on the ventilator. Patient is on an insulin pump @ home and requires basal insulin. Will need to add basal insulin if ICU Glycemic Control order set not used and insulin drip not started. If basal insulin ordered, consider starting with Lantus 45 units daily.  Will follow. Thank you, Nani Gasser. Bradshaw Minihan, RN, MSN, CDE Inpatient Glycemic Control Team Team Pager [phone] (8am-5pm) 05/18/2016 2:30 PM

## 2016-05-18 NOTE — Progress Notes (Signed)
Initial Nutrition Assessment  DOCUMENTATION CODES:   Morbid obesity  INTERVENTION:  Once patient is fluid resuscitated and hemodynamically stable, recommend initiating TF via Pepup protocol as follows.  Initiate Vital High Protein @ 40 ml/hr via OG tube and increase by 10 ml every 4 hours to goal rate of 65 ml/hr.  30 ml Prostat BID.   Tube feeding regimen provides 1760 kcal (100% of needs), 166 grams of protein, and 1310 ml of H2O.   RD will continue to follow.  NUTRITION DIAGNOSIS:   Inadequate oral intake related to inability to eat as evidenced by NPO status.  GOAL:   Provide needs based on ASPEN/SCCM guidelines  MONITOR:   TF tolerance, Vent status, Labs, I & O's  REASON FOR ASSESSMENT:   Ventilator    ASSESSMENT:   70 year old male with past medical history significant for CHF, OSA on home O2 (he had a sleep study 2016 recommending bi-level 13/7 which he reportedly does not use), COPD, diabetes, schizophrenia, and blindness of the right eye. Found to by hypercarbic/hypoxemic requiring intubation. Concern for PNA superimposed on OSA/OHS. Treat with broad spectrum ABX. Close glucose monitoring. Supportive care on vent.    Patient was awake and alert at time of assessment and could answer questions by shaking head yes or no. He reports he has had some weight loss recently, but could not answer any other questions. Per chart he weighed 320 lbs in 02/2015. This is a 10.2 kg (7% body weight) loss in 1 year and 3 months. Not significant enough to diagnose malnutrition. Pt is in mitts so could not have him write to communicate. Per RN the plan is to keep him intubated for at least 2 more days as he is requiring high amount of support now. Physician has not seen patient yet to discuss TF.  Patient has OG tube with tip terminating in stomach per abdominal x-ray.  MAP: 53-144 all morning with no clear pattern of improvement with phenylephrine.  Patient is currently intubated on  ventilator support MV: 14.5 L/min Temp (24hrs), Avg:99.8 F (37.7 C), Min:96.8 F (36 C), Max:101.8 F (38.8 C)  Medications reviewed and include: famotidine, Novolog sliding scale Q4hrs, D10 @ 30 ml/hr (provides 72 grams CHO, 245 calories), fentanyl gtt, phenylephrine gtt (rate still increasing).  Labs reviewed: CBG 19-313 past 24 hrs, Potassium 3.2 (has since received repletion).  Nutrition-Focused physical exam completed. Findings are no fat depletion, no muscle depletion, and moderate edema.   Diet Order:  Diet NPO time specified  Skin:  Reviewed, no issues  Last BM:  PTA  Height:   Ht Readings from Last 1 Encounters:  05/18/16 6' (1.829 m)    Weight:   Wt Readings from Last 1 Encounters:  05/18/16 297 lb 9.9 oz (135 kg)    Ideal Body Weight:  80.91 kg  BMI:  Body mass index is 40.36 kg/m.  Estimated Nutritional Needs:   Kcal:  1485-1890  Protein:  >/= 162 grams  Fluid:  >/= 3.2 L/day  EDUCATION NEEDS:   No education needs identified at this time  Willey Blade, MS, RD, LDN

## 2016-05-18 NOTE — Progress Notes (Signed)
eLink Physician-Brief Progress Note Patient Name: ROCZEN KOEHNEN DOB: 12-Sep-1946 MRN: [id number]   Date of Service  05/18/2016  HPI/Events of Note  Persistent hypotension in the setting of sedation and concern for sepsis.  1 500 cc bolus with no real change in BP of 76/49 (59)  eICU Interventions  Plan: 500 cc NS bolus Start NEO for BP support Consider aline to monitor/verify BP Consider CVL     Intervention Category Major Interventions: Hypotension - evaluation and management  DETERDING,ELIZABETH 05/18/2016, 6:29 AM

## 2016-05-18 NOTE — Progress Notes (Signed)
PULMONARY / CRITICAL CARE MEDICINE   Name: Rick Cruz MRN: [id number] DOB: 07-17-1946    ADMISSION DATE:  05/17/2016 CONSULTATION DATE:  05/17/2016  REFERRING MD:  Dr Shanon Brow [facility] [name]  CHIEF COMPLAINT:  AMS  HISTORY OF PRESENT ILLNESS:   70 year old male with past medical history as below, which is significant for CHF, OSA on home O2 (he had a sleep study 2016 recommending bi-level 13/7 which he reportedly does not use), COPD, diabetes, schizophrenia, and blindness of the right eye. He presented to [facility] emergency department 8/31 with confusion. Symptoms started at home where she checked his blood sugar and found to be greater than 300. She called 911 and administered 30 units of insulin. Mental status was then worsened, and repeat glucose in the emergency department was 19. He was administered D50 and Narcan, however, he continued to deteriorate complaining of shortness of breath and was eventually intubated. Plan was to admit the patient for hypoxic/hypercarbic respiratory failure, however, daily requested transfer to [facility]. Prior to transfer he apparently improved and was extubated in the emergency department. He was placed on BiPAP.  Of note, he has been admitted several times with similar complaints in the past. He does have a history of altered mental status admissions for various reasons including polypharmacy specifically benzodiazepine overdose, hyponatremia, OSA/OHS decompensation, sepsis, and hypoglycemia.  SUBJECTIVE: shock neo   VITAL SIGNS: BP (!) 90/51   Pulse 88   Temp (!) 101.8 F (38.8 C) (Core (Comment))   Resp (!) 24   Ht 6' (1.829 m)   Wt 135 kg (297 lb 9.9 oz) Comment: 09/2015  SpO2 97%   BMI 40.36 kg/m   HEMODYNAMICS:    VENTILATOR SETTINGS: Vent Mode: PRVC FiO2 (%):  [70 %-100 %] 80 % Set Rate:  [16 bmp-24 bmp] 24 bmp Vt Set:  [500 mL-620 mL] 620 mL PEEP:  [5 cmH20-10 cmH20] 5 cmH20 Plateau Pressure:  [23 cmH20-29 cmH20] 28  cmH20  INTAKE / OUTPUT: I/O last 3 completed shifts: In: 15 [I.V.:615; IV Piggyback:800] Out: 175 [Urine:175]  PHYSICAL EXAMINATION: General:  Morbidly obese male in NAD on BiPAP Neuro:  Obtunded, perr 3, rass 2, follows commands HEENT:  [place]/AT, PERRL, no JVD Cardiovascular:  RRR, no MRG Lungs:  redcued Abdomen:  Rotund, soft, non-distended Musculoskeletal:  No acute deformity Skin:  Grossly intact, rash/dry skin to suprasternal chest.   LABS:  BMET  Recent Labs Lab 05/17/16 1748  NA 143  K 3.2*  CL 93*  CO2 41*  BUN 10  CREATININE 1.00  GLUCOSE <20*    Electrolytes  Recent Labs Lab 05/17/16 1748  CALCIUM 8.8*  MG 1.8    CBC  Recent Labs Lab 05/17/16 1748  WBC 18.5*  HGB 11.9*  HCT 42.5  PLT 209    Coag's No results for input(s): APTT, INR in the last 168 hours.  Sepsis Markers  Recent Labs Lab 05/17/16 1748 05/17/16 2018  LATICACIDVEN  --  1.39  PROCALCITON <0.10  --     ABG  Recent Labs Lab 05/18/16 0337 05/18/16 0503 05/18/16 0527  PHART 7.327* 7.357 7.377  PCO2ART 75.3* 78.1* 66.7*  PO2ART 68.0* 28.0* 70.0*    Liver Enzymes  Recent Labs Lab 05/17/16 1748  AST 15  ALT 9*  ALKPHOS 64  BILITOT 0.2*  ALBUMIN 3.9    Cardiac Enzymes  Recent Labs Lab 05/17/16 1748  TROPONINI <0.03    Glucose  Recent Labs Lab 05/17/16 2006 05/17/16 2228 05/17/16  2339 05/18/16 0110 05/18/16 0414 05/18/16 0756  GLUCAP 75 40* 168* 171* 247* 313*    Imaging Dg Chest Port 1 View  Result Date: 05/18/2016 CLINICAL DATA:  Intubated patient.  Acute respiratory failure. EXAM: PORTABLE CHEST 1 VIEW COMPARISON:  Chest radiograph 05/17/2016 FINDINGS: Endotracheal tube tip remains in position at the level of the clavicular heads. The enteric tube has been removed. Unchanged cardiomegaly. Moderate pulmonary edema, increased from the prior study, with decreased aeration of the right lower lobe. Small right pleural effusion. No pneumothorax.  IMPRESSION: 1. Endotracheal tube tip in unchanged position at the level of the clavicles. 2. Moderate pulmonary edema, increased from prior study. 3. Small right pleural effusion. Electronically Signed   By: Ulyses Jarred M.D.   On: 05/18/2016 02:48   Dg Chest Portable 1 View  Result Date: 05/17/2016 CLINICAL DATA:  Status post intubation. EXAM: PORTABLE CHEST 1 VIEW COMPARISON:  05/17/16 FINDINGS: ET tube tip is above the carina. There is a nasogastric tube with tip below the field of view. Cardiac enlargement noted. Small pleural effusions are identified. Pulmonary vascular congestion. IMPRESSION: 1. Satisfactory position of ET tube with tip above the carina. 2. Mild CHF. Electronically Signed   By: Kerby Moors M.D.   On: 05/17/2016 18:31   Dg Chest Portable 1 View  Result Date: 05/17/2016 CLINICAL DATA:  Shortness of breath and altered mental status. EXAM: PORTABLE CHEST 1 VIEW COMPARISON:  02/20/2016 FINDINGS: Cardiomegaly and mild pulmonary vascular congestion noted. Mild peribronchial thickening is unchanged. The left retrocardiac region is difficult to evaluate on this study. No definite effusion or pneumothorax noted. IMPRESSION: Cardiomegaly with mild pulmonary vascular congestion. Left retrocardiac region difficult to evaluate on this study. Electronically Signed   By: Margarette Canada M.D.   On: 05/17/2016 17:48   Dg Abd Portable 1v  Result Date: 05/18/2016 CLINICAL DATA:  Orogastric tube placement EXAM: PORTABLE ABDOMEN - 1 VIEW COMPARISON:  None. FINDINGS: The OG tube extends into the stomach with tip in the region of the proximal to mid gastric body. IMPRESSION: Orogastric tube extends into the stomach Electronically Signed   By: Andreas Newport M.D.   On: 05/18/2016 02:56     STUDIES:    CULTURES: BCx2 9/1 > Urine 9/1 > Tracheal aspirate 9/1>  ANTIBIOTICS: Cefepime 8/31 >>> Vancomycin 8/31 >>> Azithromycin 9/1 >>>   SIGNIFICANT EVENTS: 8/31 - Presented to OSH ED w/ altered  mentation 9/01 - Transfer to [facility] at family request  LINES/TUBES: ETT 8/31, 9/1 >>>  DISCUSSION: 70 year old male with chronic respiratory failure presented 8/31 with decreased LOC. Found to by hypercarbic/hypoxemic requiring intubation. Concern for PNA superimposed on OSA/OHS. Treat with broad spectrum ABX. Close glucose monitoring. Supportive care on vent.   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 On home O2) OSA/OHS decompensation with central apnea/hypoventilation likely playing role with history of polypharmacy and opioids positive on UDS. No clinical response to narcan in ED. COPD without exacerbation R/o asp event from OD? P:   ABg reviewed, may need peep to 12, if unable to get to 70% Keep same MV Full vent support ABG 30 mins post intubation Keep O2 sats 88-94% Scheduled Duoneb Repeat pcxr in am   CARDIOVASCULAR A:  Chronic diastolic CHF by echo [id number], could be some acute component contributing top current illness H/o HLD Shock, r/o sirs, sepsis from asp vs OD P:  Place line, get cvp Allow  pos balance  Bolus further  Neo to map goals, if to 200 change to levophed Hold PO home meds plavix, metoprolol, lasix, zocor, asa initially while NPO Echo cortisol  RENAL A:   Hypokalemia Acute renal risk Hypoglycemia improved P:   KVO IVF Replace K per ED orders Monitor urine output Bolus Reduce d10 as glu 300 Ad saline  k supp  GASTROINTESTINAL A:   No acute issues  P:   NPO Pepcid BID for SUP Start feeds LF in am   HEMATOLOGIC A:   Anemia, Hgb at baseline  P:  Follow CBC SQ heparin for VTE ppx  INFECTIOUS A:   Worrisome for occult infection, likely source PNA - asp unlikely cap  P:   ABX as above PCT Maintain current regimen If leg ne, dount this is atypical, dc azithro  ENDOCRINE A:   DM with insulin pump Resolve dhypo Now hyper P:   DC insulin  pump CBG monitoring and SSI A1C Holding home actos d10 to 30  NEUROLOGIC A:   Acute metabolic encephalopathy Schizophrenia 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 No NMS noted P:   RASS goal: -1 PRN fentanyl and versed Holding home gabapentin, trazadone, norco, geodon - concern OD with mild abdnormal movements Ensure osmolaltiy and gap cok folow fever curve  FAMILY  - Updates: No family present. Attempted to contact wife via phone but unsuccessful. Left a message to contact the ICU.  - Inter-disciplinary family meet or Palliative Care meeting due by:  9/8   Ccm time 50 min   Lavon Paganini. Titus Mould, MD, [place] Pgr: [place] [facility]

## 2016-05-18 NOTE — Procedures (Signed)
Intubation Procedure Note Rick Cruz [id number] [birth year]  Procedure: Intubation Indications: Respiratory insufficiency  Procedure Details Consent: Unable to obtain consent because of emergent medical necessity. Time Out: Verified patient identification, verified procedure, site/side was marked, verified correct patient position, special equipment/implants available, medications/allergies/relevent history reviewed, required imaging and test results available.  Performed  Maximum clean technique was used including gloves, hand hygiene and mask.   Grade 1 Airway view with 4 MAC glidescope Indirect visualization of ETT passing through vocal cords on Glidescope screen Positive ETCO2 color change and distant bilateral breath sounds, equal.   Medications 40mg  Etomidate 80mg  Rocuronium   Evaluation Hemodynamic Status: BP stable throughout; O2 sats: stable throughout Patient's Current Condition: stable Complications: No apparent complications Patient did tolerate procedure well. Chest X-ray ordered to verify placement.  CXR: pending.   Georgann Housekeeper, AGACNP-BC [facility] Pulmonology/Critical Care Pager [phone] or [phone]  05/18/2016 2:55 AM

## 2016-05-18 NOTE — Procedures (Signed)
Central Venous Catheter Insertion Procedure Note EISEN SILVERMAN [id number] 1945-11-07  Procedure: Insertion of Central Venous Catheter Indications: Assessment of intravascular volume, Drug and/or fluid administration and Frequent blood sampling  Procedure Details Consent: Risks of procedure as well as the alternatives and risks of each were explained to the (patient/caregiver).  Consent for procedure obtained. Time Out: Verified patient identification, verified procedure, site/side was marked, verified correct patient position, special equipment/implants available, medications/allergies/relevent history reviewed, required imaging and test results available.  Performed  Maximum sterile technique was used including antiseptics, cap, gloves, gown, hand hygiene, mask and sheet. Skin prep: Chlorhexidine; local anesthetic administered A antimicrobial bonded/coated triple lumen catheter was placed in the right internal jugular vein using the Seldinger technique. Ultrasound guidance used.Yes.   Catheter placed to 17 cm. Blood aspirated via all 3 ports and then flushed x 3. Line sutured x 2 and dressing applied.  Evaluation Blood flow good Complications: No apparent complications Patient did tolerate procedure well. Chest X-ray ordered to verify placement.  CXR: pending.  Richardson Landry Minor ACNP Maryanna Shape PCCM Pager [phone] till 3 pm If no answer page [phone] 05/18/2016, 11:28 AM  [place] Shock Camara Renstrom J. Titus Mould, MD, [place] Pgr: [place] [facility]

## 2016-05-18 NOTE — Progress Notes (Signed)
Inpatient Diabetes Program Recommendations  AACE/ADA: New Consensus Statement on Inpatient Glycemic Control (2015)  Target Ranges:  Prepandial:   less than 140 mg/dL      Peak postprandial:   less than 180 mg/dL (1-2 hours)      Critically ill patients:  140 - 180 mg/dL   Results for KASYN, BRAU (MRN [id number]) as of 05/18/2016 11:26  Ref. Range 05/17/2016 23:39 05/18/2016 01:10 05/18/2016 04:14 05/18/2016 07:56  Glucose-Capillary Latest Ref Range: 65 - 99 mg/dL 168 (H) 171 (H) 247 (H) 313 (H)    Inpatient Diabetes Program Recommendations:  Please consider ICU Glycemic Control Order set while patient is on ventilator.  Thank you, Nani Gasser. Hephzibah Strehle, RN, MSN, CDE Inpatient Glycemic Control Team Team Pager [phone] (8am-5pm) 05/18/2016 11:27 AM

## 2016-05-19 ENCOUNTER — Inpatient Hospital Stay (HOSPITAL_COMMUNITY): Payer: PPO

## 2016-05-19 DIAGNOSIS — J690 Pneumonitis due to inhalation of food and vomit: Secondary | ICD-10-CM | POA: Diagnosis not present

## 2016-05-19 DIAGNOSIS — J9620 Acute and chronic respiratory failure, unspecified whether with hypoxia or hypercapnia: Secondary | ICD-10-CM | POA: Diagnosis not present

## 2016-05-19 DIAGNOSIS — J9602 Acute respiratory failure with hypercapnia: Secondary | ICD-10-CM | POA: Diagnosis not present

## 2016-05-19 DIAGNOSIS — E876 Hypokalemia: Secondary | ICD-10-CM | POA: Diagnosis not present

## 2016-05-19 DIAGNOSIS — I5032 Chronic diastolic (congestive) heart failure: Secondary | ICD-10-CM | POA: Diagnosis not present

## 2016-05-19 DIAGNOSIS — J9601 Acute respiratory failure with hypoxia: Secondary | ICD-10-CM | POA: Diagnosis not present

## 2016-05-19 DIAGNOSIS — Z9641 Presence of insulin pump (external) (internal): Secondary | ICD-10-CM

## 2016-05-19 LAB — CK: CK TOTAL: 123 U/L (ref 49–397)

## 2016-05-19 LAB — GLUCOSE, CAPILLARY
GLUCOSE-CAPILLARY: 297 mg/dL — AB (ref 65–99)
GLUCOSE-CAPILLARY: 299 mg/dL — AB (ref 65–99)
GLUCOSE-CAPILLARY: 319 mg/dL — AB (ref 65–99)
GLUCOSE-CAPILLARY: 351 mg/dL — AB (ref 65–99)
Glucose-Capillary: 284 mg/dL — ABNORMAL HIGH (ref 65–99)
Glucose-Capillary: 261 mg/dL — ABNORMAL HIGH (ref 65–99)

## 2016-05-19 LAB — VANCOMYCIN, TROUGH: Vancomycin Tr: 12 ug/mL — ABNORMAL LOW (ref 15–20)

## 2016-05-19 LAB — URINE CULTURE
Culture: NO GROWTH
Special Requests: NORMAL

## 2016-05-19 LAB — COMPREHENSIVE METABOLIC PANEL
ALK PHOS: 46 U/L (ref 38–126)
ALT: 8 U/L — AB (ref 17–63)
ANION GAP: 6 (ref 5–15)
AST: 12 U/L — ABNORMAL LOW (ref 15–41)
Albumin: 2.4 g/dL — ABNORMAL LOW (ref 3.5–5.0)
BILIRUBIN TOTAL: 0.7 mg/dL (ref 0.3–1.2)
BUN: 12 mg/dL (ref 6–20)
CALCIUM: 7.6 mg/dL — AB (ref 8.9–10.3)
CO2: 35 mmol/L — AB (ref 22–32)
CREATININE: 1.04 mg/dL (ref 0.61–1.24)
Chloride: 92 mmol/L — ABNORMAL LOW (ref 101–111)
GFR calc non Af Amer: >60 mL/min (ref >60)
GLUCOSE: 279 mg/dL — AB (ref 65–99)
Potassium: 3.8 mmol/L (ref 3.5–5.1)
SODIUM: 133 mmol/L — AB (ref 135–145)
TOTAL PROTEIN: 4.8 g/dL — AB (ref 6.5–8.1)

## 2016-05-19 LAB — PHOSPHORUS
PHOSPHORUS: 2.1 mg/dL — AB (ref 2.5–4.6)
Phosphorus: 2.9 mg/dL (ref 2.5–4.6)

## 2016-05-19 LAB — CBC WITH DIFFERENTIAL/PLATELET
BASOS ABS: 0 10*3/uL (ref 0.0–0.1)
BASOS PCT: 0 %
EOS ABS: 0.1 10*3/uL (ref 0.0–0.7)
Eosinophils Relative: 1 %
HCT: 31.7 % — ABNORMAL LOW (ref 39.0–52.0)
Hemoglobin: 8.9 g/dL — ABNORMAL LOW (ref 13.0–17.0)
Lymphocytes Relative: 8 %
Lymphs Abs: 1.1 10*3/uL (ref 0.7–4.0)
MCH: 26 pg (ref 26.0–34.0)
MCHC: 28.1 g/dL — AB (ref 30.0–36.0)
MCV: 92.7 fL (ref 78.0–100.0)
MONO ABS: 0.8 10*3/uL (ref 0.1–1.0)
MONOS PCT: 6 %
NEUTROS PCT: 85 %
Neutro Abs: 12.2 10*3/uL — ABNORMAL HIGH (ref 1.7–7.7)
PLATELETS: 149 10*3/uL — AB (ref 150–400)
RBC: 3.42 MIL/uL — ABNORMAL LOW (ref 4.22–5.81)
RDW: 13.8 % (ref 11.5–15.5)
WBC: 14.2 10*3/uL — ABNORMAL HIGH (ref 4.0–10.5)

## 2016-05-19 LAB — MAGNESIUM
MAGNESIUM: 2 mg/dL (ref 1.7–2.4)
Magnesium: 2.1 mg/dL (ref 1.7–2.4)

## 2016-05-19 LAB — PROCALCITONIN: Procalcitonin: 3.53 ng/mL

## 2016-05-19 MED ORDER — SODIUM PHOSPHATES 45 MMOLE/15ML IV SOLN
30.0000 mmol | Freq: Once | INTRAVENOUS | Status: AC
  Administered 2016-05-19: 30 mmol via INTRAVENOUS
  Filled 2016-05-19: qty 10

## 2016-05-19 MED ORDER — VANCOMYCIN HCL 10 G IV SOLR
1500.0000 mg | Freq: Two times a day (BID) | INTRAVENOUS | Status: DC
  Administered 2016-05-19 – 2016-05-21 (×5): 1500 mg via INTRAVENOUS
  Filled 2016-05-19 (×7): qty 1500

## 2016-05-19 MED ORDER — FUROSEMIDE 10 MG/ML IJ SOLN
20.0000 mg | Freq: Once | INTRAMUSCULAR | Status: AC
  Administered 2016-05-19: 20 mg via INTRAVENOUS
  Filled 2016-05-19: qty 2

## 2016-05-19 MED ORDER — IPRATROPIUM-ALBUTEROL 0.5-2.5 (3) MG/3ML IN SOLN
3.0000 mL | RESPIRATORY_TRACT | Status: DC
  Administered 2016-05-19 – 2016-05-24 (×27): 3 mL via RESPIRATORY_TRACT
  Filled 2016-05-19 (×28): qty 3

## 2016-05-19 NOTE — Progress Notes (Signed)
Pharmacy Antibiotic Note  Rick Cruz is a 70 y.o. male admitted on 05/17/2016 with sepsis.  Pharmacy has been consulted for vancomycin and cefepime dosing.  Plan: Increase Vancomycin 1500mg  q12 hours Cefepime 2 gm IV q8 hours F/u renal function, cultures and clinical course Recheck vancomycin trough at steady state if needed  Height: 6' (182.9 cm) Weight: 297 lb 9.9 oz (135 kg) (09/2015) IBW/kg (Calculated) : 77.6  Temp (24hrs), Avg:100.7 F (38.2 C), Min:100.4 F (38 C), Max:100.9 F (38.3 C)   Recent Labs Lab 05/17/16 1748 05/17/16 2018 05/18/16 1200 05/18/16 1237 05/18/16 1300 05/18/16 2015 05/19/16 0330 05/19/16 2030  WBC 18.5*  --  27.0*  --   --   --  14.2*  --   CREATININE 1.00  --  1.35*  --  1.33*  --  1.04  --   LATICACIDVEN  --  1.39  --  2.7*  --  1.4  --   --   VANCOTROUGH  --   --   --   --   --   --   --  12*    Estimated Creatinine Clearance: 95.4 mL/min (by C-G formula based on SCr of 1.04 mg/dL).    Allergies  Allergen Reactions  . Phenergan [Promethazine Hcl] Other (See Comments)    Becomes very confused and aggressive  . Haldol [Haloperidol Lactate] Other (See Comments)    Shaking   . Metformin And Related Diarrhea    Antimicrobials this admission: vanc 8/31 >>  cefepime 8/31 >>  Azithromycin 9/1 >9/1  Dose adjustments 9/2 Vancomycin trough 12 on 1g q12>>increase to 1500mg  IV q12 hours  Culture Data 9/1 BCx: ngtd 9/1 UCx: ng 9/1 Resp Cx: GPC in pairs and chains- reincubated MRSA PCR: Neg  Thank you for allowing pharmacy to be a part of this patient's care. Erin Hearing PharmD., BCPS Clinical Pharmacist Pager [phone] 05/19/2016 9:47 PM

## 2016-05-19 NOTE — Progress Notes (Signed)
PULMONARY / CRITICAL CARE MEDICINE   Name: Rick Cruz MRN: [id number] DOB: 1946-07-16    ADMISSION DATE:  05/17/2016 CONSULTATION DATE:  05/17/2016  REFERRING MD:  Dr Shanon Brow [facility] [name]  CHIEF COMPLAINT:  AMS  HISTORY OF PRESENT ILLNESS:   70 year old male with past medical history as below, which is significant for CHF, OSA on home O2 (he had a sleep study 2016 recommending bi-level 13/7 which he reportedly does not use), COPD, diabetes, schizophrenia, and blindness of the right eye. He presented to [facility] emergency department 8/31 with confusion. Symptoms started at home where she checked his blood sugar and found to be greater than 300. She called 911 and administered 30 units of insulin. Mental status was then worsened, and repeat glucose in the emergency department was 19. He was administered D50 and Narcan, however, he continued to deteriorate complaining of shortness of breath and was eventually intubated. Plan was to admit the patient for hypoxic/hypercarbic respiratory failure, however, daily requested transfer to [facility]. Prior to transfer he apparently improved and was extubated in the emergency department. He was placed on BiPAP.  Of note, he has been admitted several times with similar complaints in the past. He does have a history of altered mental status admissions for various reasons including polypharmacy specifically benzodiazepine overdose, hyponatremia, OSA/OHS decompensation, sepsis, and hypoglycemia.  SUBJECTIVE:  Off neo. PEEP weaning down.  VITAL SIGNS: BP 126/82   Pulse 90   Temp (!) 100.9 F (38.3 C) (Core (Comment))   Resp (!) 24   Ht 6' (1.829 m)   Wt 297 lb 9.9 oz (135 kg) Comment: 09/2015  SpO2 93%   BMI 40.36 kg/m   HEMODYNAMICS: CVP:  [5 mmHg-7 mmHg] 6 mmHg  VENTILATOR SETTINGS: Vent Mode: PRVC FiO2 (%):  [40 %-60 %] 40 % Set Rate:  [24 bmp] 24 bmp Vt Set:  [610 mL-620 mL] 620 mL PEEP:  [8 cmH20-10 cmH20] 8 cmH20 Plateau  Pressure:  [22 [id number] cmH20] 22 cmH20  INTAKE / OUTPUT: I/O last 3 completed shifts: In: 8637.6 [I.V.:4740.1; NG/GT:827.4; IV Piggyback:3070] Out: [id number] [Urine:1575]  PHYSICAL EXAMINATION: General:  Morbidly obese male, no distress Neuro:  Sedated. Moves all four extremities HEENT:  ETT in place Cardiovascular:  RRR, no MRG Lungs:  No wheeze or crackles Abdomen:  Rotund, soft, non-distended Musculoskeletal:  No acute deformity Skin:  Grossly intact.   LABS:  BMET  Recent Labs Lab 05/18/16 1200 05/18/16 1300 05/19/16 0330  NA 132* 133* 133*  K 5.1 5.1 3.8  CL 92* 93* 92*  CO2 35* 35* 35*  BUN 10 10 12   CREATININE 1.35* 1.33* 1.04  GLUCOSE 340* 338* 279*    Electrolytes  Recent Labs Lab 05/18/16 1200  05/18/16 1245 05/18/16 1300 05/18/16 2015 05/19/16 0330  CALCIUM 7.3*  --   --  7.3*  --  7.6*  MG  --   --  1.1*  --  1.2* 2.0  PHOS  --   < > <1.0* <1.0* 3.1 2.1*  < > = values in this interval not displayed.  CBC  Recent Labs Lab 05/17/16 1748 05/18/16 1200 05/19/16 0330  WBC 18.5* 27.0* 14.2*  HGB 11.9* 9.5* 8.9*  HCT 42.5 34.2* 31.7*  PLT 209 179 149*    Coag's  Recent Labs Lab 05/18/16 1200  APTT 22*  INR 1.21    Sepsis Markers  Recent Labs Lab 05/17/16 1748 05/17/16 2018 05/18/16 1237 05/18/16 1239 05/18/16 2015 05/19/16 0330  LATICACIDVEN  --  1.39 2.7*  --  1.4  --   PROCALCITON <0.10  --   --  6.50  --  3.53    ABG  Recent Labs Lab 05/18/16 0337 05/18/16 0503 05/18/16 0527  PHART 7.327* 7.357 7.377  PCO2ART 75.3* 78.1* 66.7*  PO2ART 68.0* 28.0* 70.0*    Liver Enzymes  Recent Labs Lab 05/17/16 1748 05/18/16 1300 05/19/16 0330  AST 15  --  12*  ALT 9*  --  8*  ALKPHOS 64  --  46  BILITOT 0.2*  --  0.7  ALBUMIN 3.9 2.5* 2.4*    Cardiac Enzymes  Recent Labs Lab 05/17/16 1748 05/18/16 1200  TROPONINI <0.03 0.03*    Glucose  Recent Labs Lab 05/18/16 1201 05/18/16 1547 05/18/16 1958  05/18/16 2333 05/19/16 0339 05/19/16 0755  GLUCAP 303* 328* 302* 289* 284* 297*    Imaging Dg Chest Port 1 View  Result Date: 05/19/2016 CLINICAL DATA:  Aspiration pneumonia. EXAM: PORTABLE CHEST 1 VIEW COMPARISON:  05/18/2016 FINDINGS: The endotracheal tube is 4 cm above the carina. The NG tube is coursing down the esophagus and into the stomach. The right IJ catheter has been removed. The heart is enlarged but stable. Persistent bibasilar infiltrates. Possible small left effusion. IMPRESSION: Stable support apparatus except the right IJ catheter has been removed. Persistent bibasilar infiltrates and possible small left effusion. Electronically Signed   By: Marijo Sanes M.D.   On: 05/19/2016 07:54   Dg Chest Port 1 View  Result Date: 05/18/2016 CLINICAL DATA:  Central catheter placement.  Hypoxia. EXAM: PORTABLE CHEST 1 VIEW COMPARISON:  Study obtained earlier in the day FINDINGS: Central catheter tip is in the superior vena cava. Nasogastric tube tip and side port are below the diaphragm. Endotracheal tube tip is 3.9 cm above the carina. No pneumothorax. There is a slight degree of interstitial edema. Lungs otherwise are clear. No airspace consolidation. Heart is mildly enlarged with pulmonary vascular within normal limits. No adenopathy. There is aortic atherosclerotic calcification. IMPRESSION: Tube and catheter positions as described without pneumothorax. Cardiomegaly with mild edema. Suspect a mild degree of congestive heart failure. No airspace consolidation. There is aortic atherosclerosis. Electronically Signed   By: Lowella Grip III M.D.   On: 05/18/2016 11:56     STUDIES:    CULTURES: BCx2 9/1 > Urine 9/1 > Tracheal aspirate 9/1> GPCs  ANTIBIOTICS: Cefepime 8/31 >>> Vancomycin 8/31 >>> Azithromycin 9/1   SIGNIFICANT EVENTS: 8/31 - Presented to OSH ED w/ altered mentation 9/01 - Transfer to [facility] at family request  LINES/TUBES: ETT 8/31, 9/1 >>>  DISCUSSION: 70  year old male with chronic respiratory failure presented 8/31 with decreased LOC. Found to by hypercarbic/hypoxemic requiring intubation. Concern for PNA superimposed on OSA/OHS. Treat with broad spectrum ABX. Close glucose monitoring. Supportive care on vent.   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 On home O2) OSA/OHS decompensation with central apnea/hypoventilation likely playing role with history of polypharmacy and opioids positive on UDS. No clinical response to narcan in ED. COPD without exacerbation R/o asp event from OD? P:   Wean off Peep Follow ABG Weaning trials when on minimal vent settings Keep O2 sats 88-94% Scheduled Duoneb  CARDIOVASCULAR A:  Chronic diastolic CHF by echo [id number], could be some acute component contributing top current illness H/o HLD Shock, r/o sirs, sepsis from asp vs OD P:  Off neo Keep I/O even Hold PO home meds  plavix, metoprolol, lasix, zocor, asa initially while NPO Follow Echo  RENAL A:   Hypokalemia Acute renal risk Hypoglycemia improved P:   KVO IVF Replete lytes Monitor urine output   GASTROINTESTINAL A:   No acute issues  P:   NPO Pepcid BID for SUP Start feeds  HEMATOLOGIC A:   Anemia, Hgb at baseline  P:  Follow CBC SQ heparin for VTE ppx  INFECTIOUS A:   Worrisome for occult infection, likely source PNA - asp unlikely cap  P:   ABX as above PCT Maintain current regimen  ENDOCRINE A:   DM with insulin pump Resolve dhypo Now hyper P:   CBG monitoring and SSI A1C Holding home actos  NEUROLOGIC A:   Acute metabolic encephalopathy Schizophrenia 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 No NMS noted P:   RASS goal: -1 PRN fentanyl and versed Holding home gabapentin, trazadone, norco, geodon - concern OD with mild  abdnormal movements  FAMILY  - Updates: No family present. Attempted to contact wife via phone but unsuccessful. Left a message to contact the ICU. - Inter-disciplinary family meet or Palliative Care meeting due by:  9/8  Critical care time- 40 mins.  Marshell Garfinkel MD [place] [facility] Pager [phone] If no answer or after 3pm call: [phone] 05/19/2016, 8:57 AM

## 2016-05-19 NOTE — Progress Notes (Signed)
RT note- Notified RN that PIP's elevated, patient is very coarse through out, with expiratory wheezes. Dr. Ashok Cordia called and lasix ordered.

## 2016-05-19 NOTE — Progress Notes (Signed)
[place] Progress Note Patient Name: Rick Cruz DOB: [birth year] MRN: [id number]   Date of Service  05/19/2016  HPI/Events of Note  Notified by bedside nurse of high peak pressures. Reportedly patient with coarse breath sounds. Acute renal failure resolved. Patient has approximately 7 L positive fluid balance.   eICU Interventions  1. Lasix 20 mg IV 1 2. Increasing frequency of DuoNeb to every 4 hours 3. Patient to remain at least 30 head of the bed elevation      Intervention Category Intermediate Interventions: Respiratory distress - evaluation and management  Tera Partridge 05/19/2016, 5:32 PM

## 2016-05-19 NOTE — Progress Notes (Signed)
RT note- increased Peep +8.

## 2016-05-19 NOTE — Progress Notes (Signed)
[place] Progress Note Patient Name: Rick Cruz DOB: [birth year] MRN: [id number]   Date of Service  05/19/2016  HPI/Events of Note  Hypophosphatemia  eICU Interventions  Phos replaced     Intervention Category Intermediate Interventions: Electrolyte abnormality - evaluation and management  [name] 05/19/2016, 4:15 AM

## 2016-05-20 ENCOUNTER — Inpatient Hospital Stay (HOSPITAL_COMMUNITY): Payer: PPO

## 2016-05-20 DIAGNOSIS — I5032 Chronic diastolic (congestive) heart failure: Secondary | ICD-10-CM | POA: Diagnosis not present

## 2016-05-20 DIAGNOSIS — R579 Shock, unspecified: Secondary | ICD-10-CM | POA: Diagnosis not present

## 2016-05-20 DIAGNOSIS — R1310 Dysphagia, unspecified: Secondary | ICD-10-CM | POA: Diagnosis not present

## 2016-05-20 DIAGNOSIS — Z6841 Body Mass Index (BMI) 40.0 and over, adult: Secondary | ICD-10-CM | POA: Diagnosis not present

## 2016-05-20 DIAGNOSIS — A419 Sepsis, unspecified organism: Secondary | ICD-10-CM | POA: Diagnosis not present

## 2016-05-20 DIAGNOSIS — J9602 Acute respiratory failure with hypercapnia: Secondary | ICD-10-CM | POA: Diagnosis not present

## 2016-05-20 DIAGNOSIS — J9620 Acute and chronic respiratory failure, unspecified whether with hypoxia or hypercapnia: Secondary | ICD-10-CM | POA: Diagnosis not present

## 2016-05-20 DIAGNOSIS — J690 Pneumonitis due to inhalation of food and vomit: Secondary | ICD-10-CM | POA: Diagnosis not present

## 2016-05-20 DIAGNOSIS — N179 Acute kidney failure, unspecified: Secondary | ICD-10-CM | POA: Diagnosis not present

## 2016-05-20 DIAGNOSIS — E876 Hypokalemia: Secondary | ICD-10-CM | POA: Diagnosis not present

## 2016-05-20 DIAGNOSIS — G9341 Metabolic encephalopathy: Secondary | ICD-10-CM | POA: Diagnosis not present

## 2016-05-20 DIAGNOSIS — F200 Paranoid schizophrenia: Secondary | ICD-10-CM | POA: Diagnosis not present

## 2016-05-20 DIAGNOSIS — E662 Morbid (severe) obesity with alveolar hypoventilation: Secondary | ICD-10-CM | POA: Diagnosis not present

## 2016-05-20 DIAGNOSIS — J9621 Acute and chronic respiratory failure with hypoxia: Secondary | ICD-10-CM | POA: Diagnosis not present

## 2016-05-20 DIAGNOSIS — J9600 Acute respiratory failure, unspecified whether with hypoxia or hypercapnia: Secondary | ICD-10-CM | POA: Diagnosis not present

## 2016-05-20 DIAGNOSIS — J9622 Acute and chronic respiratory failure with hypercapnia: Secondary | ICD-10-CM | POA: Diagnosis not present

## 2016-05-20 DIAGNOSIS — J9601 Acute respiratory failure with hypoxia: Secondary | ICD-10-CM | POA: Diagnosis not present

## 2016-05-20 LAB — BASIC METABOLIC PANEL
Anion gap: 3 — ABNORMAL LOW (ref 5–15)
BUN: 15 mg/dL (ref 6–20)
CALCIUM: 7.8 mg/dL — AB (ref 8.9–10.3)
CHLORIDE: 97 mmol/L — AB (ref 101–111)
CO2: 35 mmol/L — AB (ref 22–32)
CREATININE: 0.86 mg/dL (ref 0.61–1.24)
GFR calc non Af Amer: >60 mL/min (ref >60)
Glucose, Bld: 276 mg/dL — ABNORMAL HIGH (ref 65–99)
Potassium: 4.3 mmol/L (ref 3.5–5.1)
Sodium: 135 mmol/L (ref 135–145)

## 2016-05-20 LAB — CULTURE, RESPIRATORY
CULTURE: NORMAL
SPECIAL REQUESTS: NORMAL

## 2016-05-20 LAB — PHOSPHORUS: PHOSPHORUS: 2.2 mg/dL — AB (ref 2.5–4.6)

## 2016-05-20 LAB — CBC
HEMATOCRIT: 28.4 % — AB (ref 39.0–52.0)
HEMOGLOBIN: 8.2 g/dL — AB (ref 13.0–17.0)
MCH: 26.4 pg (ref 26.0–34.0)
MCHC: 28.9 g/dL — AB (ref 30.0–36.0)
MCV: 91.3 fL (ref 78.0–100.0)
Platelets: 146 10*3/uL — ABNORMAL LOW (ref 150–400)
RBC: 3.11 MIL/uL — ABNORMAL LOW (ref 4.22–5.81)
RDW: 13.8 % (ref 11.5–15.5)
WBC: 8.9 10*3/uL (ref 4.0–10.5)

## 2016-05-20 LAB — GLUCOSE, CAPILLARY
GLUCOSE-CAPILLARY: 271 mg/dL — AB (ref 65–99)
Glucose-Capillary: 252 mg/dL — ABNORMAL HIGH (ref 65–99)
Glucose-Capillary: 333 mg/dL — ABNORMAL HIGH (ref 65–99)
Glucose-Capillary: 264 mg/dL — ABNORMAL HIGH (ref 65–99)
Glucose-Capillary: 254 mg/dL — ABNORMAL HIGH (ref 65–99)

## 2016-05-20 LAB — PROCALCITONIN: Procalcitonin: 1.86 ng/mL

## 2016-05-20 LAB — MAGNESIUM: Magnesium: 2 mg/dL (ref 1.7–2.4)

## 2016-05-20 LAB — CULTURE, RESPIRATORY W GRAM STAIN

## 2016-05-20 MED ORDER — FUROSEMIDE 10 MG/ML IJ SOLN
40.0000 mg | Freq: Two times a day (BID) | INTRAMUSCULAR | Status: DC
  Administered 2016-05-20 – 2016-05-22 (×5): 40 mg via INTRAVENOUS
  Filled 2016-05-20 (×5): qty 4

## 2016-05-20 MED ORDER — LIDOCAINE HCL (PF) 1 % IJ SOLN
INTRAMUSCULAR | Status: AC
  Administered 2016-05-20: 09:00:00
  Filled 2016-05-20: qty 5

## 2016-05-20 MED ORDER — INSULIN ASPART 100 UNIT/ML ~~LOC~~ SOLN
0.0000 [IU] | SUBCUTANEOUS | Status: DC
  Administered 2016-05-20: 5 [IU] via SUBCUTANEOUS
  Administered 2016-05-20: 15 [IU] via SUBCUTANEOUS
  Administered 2016-05-20: 4 [IU] via SUBCUTANEOUS
  Administered 2016-05-20: 11 [IU] via SUBCUTANEOUS
  Administered 2016-05-21: 7 [IU] via SUBCUTANEOUS
  Administered 2016-05-21: 15 [IU] via SUBCUTANEOUS
  Administered 2016-05-21 (×2): 4 [IU] via SUBCUTANEOUS
  Administered 2016-05-21: 11 [IU] via SUBCUTANEOUS
  Administered 2016-05-21: 7 [IU] via SUBCUTANEOUS
  Administered 2016-05-21 – 2016-05-22 (×2): 11 [IU] via SUBCUTANEOUS
  Administered 2016-05-22 (×2): 7 [IU] via SUBCUTANEOUS
  Administered 2016-05-22 – 2016-05-24 (×13): 4 [IU] via SUBCUTANEOUS
  Administered 2016-05-25 (×4): 3 [IU] via SUBCUTANEOUS
  Administered 2016-05-25: 7 [IU] via SUBCUTANEOUS
  Administered 2016-05-26 – 2016-05-27 (×4): 4 [IU] via SUBCUTANEOUS
  Administered 2016-05-27: 7 [IU] via SUBCUTANEOUS
  Administered 2016-05-27 – 2016-05-28 (×2): 4 [IU] via SUBCUTANEOUS
  Administered 2016-05-28: 7 [IU] via SUBCUTANEOUS
  Administered 2016-05-28: 3 [IU] via SUBCUTANEOUS
  Administered 2016-05-28: 11 [IU] via SUBCUTANEOUS
  Administered 2016-05-29 (×2): 4 [IU] via SUBCUTANEOUS
  Administered 2016-05-29: 3 [IU] via SUBCUTANEOUS

## 2016-05-20 NOTE — Progress Notes (Addendum)
PULMONARY / CRITICAL CARE MEDICINE   Name: Rick Cruz MRN: [id number] DOB: 1946-03-30    ADMISSION DATE:  05/17/2016 CONSULTATION DATE:  05/17/2016  REFERRING MD:  Dr Shanon Brow [facility] [name]  CHIEF COMPLAINT:  AMS  HISTORY OF PRESENT ILLNESS:   70 year old male with past medical history as below, which is significant for CHF, OSA on home O2 (he had a sleep study 2016 recommending bi-level 13/7 which he reportedly does not use), COPD, diabetes, schizophrenia, and blindness of the right eye. He presented to [facility] emergency department 8/31 with confusion. Symptoms started at home where she checked his blood sugar and found to be greater than 300. She called 911 and administered 30 units of insulin. Mental status was then worsened, and repeat glucose in the emergency department was 19. He was administered D50 and Narcan, however, he continued to deteriorate complaining of shortness of breath and was eventually intubated. Plan was to admit the patient for hypoxic/hypercarbic respiratory failure, however, daily requested transfer to [facility]. Prior to transfer he apparently improved and was extubated in the emergency department. He was placed on BiPAP.  Of note, he has been admitted several times with similar complaints in the past. He does have a history of altered mental status admissions for various reasons including polypharmacy specifically benzodiazepine overdose, hyponatremia, OSA/OHS decompensation, sepsis, and hypoglycemia.  SUBJECTIVE:  PEEP weaning down.   VITAL SIGNS: BP 136/81   Pulse 84   Temp 99.7 F (37.6 C) (Oral)   Resp (!) 21   Ht 6' (1.829 m)   Wt (!) 326 lb 15.1 oz (148.3 kg)   SpO2 92%   BMI 44.34 kg/m   HEMODYNAMICS: CVP:  [6 mmHg-9 mmHg] 9 mmHg  VENTILATOR SETTINGS: Vent Mode: PRVC FiO2 (%):  [40 %] 40 % Set Rate:  [24 bmp] 24 bmp Vt Set:  [620 mL] 620 mL PEEP:  [5 cmH20-8 cmH20] 8 cmH20 Plateau Pressure:  [25 cmH20-29 cmH20] 27  cmH20  INTAKE / OUTPUT: I/O last 3 completed shifts: In: 7554.2 [I.V.:4420.9; NG/GT:1473.3; IV Piggyback:1660] Out: 2480 [Urine:2480]  PHYSICAL EXAMINATION: General:  Morbidly obese male, no distress Neuro:  Sedated. Moves all four extremities HEENT:  ETT in place Cardiovascular:  RRR, no MRG Lungs:  No wheeze or crackles Abdomen:  Rotund, soft, non-distended Musculoskeletal:  No acute deformity Skin:  Grossly intact.   LABS:  BMET  Recent Labs Lab 05/18/16 1300 05/19/16 0330 05/20/16 0400  NA 133* 133* 135  K 5.1 3.8 4.3  CL 93* 92* 97*  CO2 35* 35* 35*  BUN 10 12 15   CREATININE 1.33* 1.04 0.86  GLUCOSE 338* 279* 276*    Electrolytes  Recent Labs Lab 05/18/16 1300  05/19/16 0330 05/19/16 1726 05/20/16 0400  CALCIUM 7.3*  --  7.6*  --  7.8*  MG  --   < > 2.0 2.1 2.0  PHOS <1.0*  < > 2.1* 2.9 2.2*  < > = values in this interval not displayed.  CBC  Recent Labs Lab 05/18/16 1200 05/19/16 0330 05/20/16 0400  WBC 27.0* 14.2* 8.9  HGB 9.5* 8.9* 8.2*  HCT 34.2* 31.7* 28.4*  PLT 179 149* 146*    Coag's  Recent Labs Lab 05/18/16 1200  APTT 22*  INR 1.21    Sepsis Markers  Recent Labs Lab 05/17/16 2018 05/18/16 1237 05/18/16 1239 05/18/16 2015 05/19/16 0330 05/20/16 0400  LATICACIDVEN 1.39 2.7*  --  1.4  --   --   PROCALCITON  --   --  6.50  --  3.53 1.86    ABG  Recent Labs Lab 05/18/16 0337 05/18/16 0503 05/18/16 0527  PHART 7.327* 7.357 7.377  PCO2ART 75.3* 78.1* 66.7*  PO2ART 68.0* 28.0* 70.0*    Liver Enzymes  Recent Labs Lab 05/17/16 1748 05/18/16 1300 05/19/16 0330  AST 15  --  12*  ALT 9*  --  8*  ALKPHOS 64  --  46  BILITOT 0.2*  --  0.7  ALBUMIN 3.9 2.5* 2.4*    Cardiac Enzymes  Recent Labs Lab 05/17/16 1748 05/18/16 1200  TROPONINI <0.03 0.03*    Glucose  Recent Labs Lab 05/19/16 1205 05/19/16 1714 05/19/16 2016 05/19/16 2349 05/20/16 0356 05/20/16 0753  GLUCAP 351* 261* 319* 299* 271*  252*    Imaging Dg Chest Port 1 View  Result Date: 05/20/2016 CLINICAL DATA:  Sepsis, acute respiratory failure EXAM: PORTABLE CHEST 1 VIEW COMPARISON:  05/19/2016 FINDINGS: Endotracheal tube terminates 3 cm above the carina. Cardiomegaly with mild interstitial edema. Suspected small left pleural effusion. No pneumothorax. Right IJ venous catheter terminates in the mid SVC. Enteric tube courses below the diaphragm. IMPRESSION: Endotracheal tube terminates 3 cm above the carina. Cardiomegaly with mild interstitial edema and suspected small left pleural effusion. Electronically Signed   By: Julian Hy M.D.   On: 05/20/2016 07:25   STUDIES:   CULTURES: BCx2 9/1 > Urine 9/1 > Tracheal aspirate 9/1> GPCs  ANTIBIOTICS: Cefepime 8/31 >>> Vancomycin 8/31 >>> Azithromycin 9/1   SIGNIFICANT EVENTS: 8/31 - Presented to OSH ED w/ altered mentation 9/01 - Transfer to [facility] at family request  LINES/TUBES: ETT 8/31, 9/1 >>>  DISCUSSION: 70 year old male with chronic respiratory failure presented 8/31 with decreased LOC. Found to by hypercarbic/hypoxemic requiring intubation. Concern for PNA superimposed on OSA/OHS. Treat with broad spectrum ABX. Close glucose monitoring. Supportive care on vent.   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 On home O2) OSA/OHS decompensation with central apnea/hypoventilation likely playing role with history of polypharmacy and opioids positive on UDS. No clinical response to narcan in ED. COPD without exacerbation R/o asp event from OD? P:   Wean off Peep Follow ABG Weaning trials when on minimal vent settings Keep O2 sats 88-94% Scheduled Duoneb  CARDIOVASCULAR A:  Chronic diastolic CHF by echo [id number], could be some acute component contributing top current illness H/o HLD Shock, r/o sirs, sepsis from asp vs OD P:  Off neo Start lasix for diuresis Hold PO home  meds plavix, metoprolol, lasix, zocor, asa initially while NPO Follow Echo  RENAL A:   Hypokalemia Acute renal risk Hypoglycemia improved P:   KVO IVF Replete lytes Monitor urine output  GASTROINTESTINAL A:   No acute issues  P:   NPO Pepcid BID for SUP Continue feeds  HEMATOLOGIC A:   Anemia, Hgb at baseline  P:  Follow CBC SQ heparin for VTE ppx  INFECTIOUS A:   Worrisome for occult infection, likely source PNA - asp unlikely cap  P:   ABX as above PCT Maintain current regimen  ENDOCRINE A:   DM with insulin pump Hypoglycemia, now hyper P:   CBG monitoring and SSI Change to resistant scale A1C Holding home actos  NEUROLOGIC A:   Acute metabolic encephalopathy Schizophrenia 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 No NMS noted P:   RASS goal: -1  PRN fentanyl and versed Holding home gabapentin, trazadone, norco, geodon - concern OD with mild abdnormal movements  FAMILY  - Updates: No family present. Attempted to contact wife via phone but unsuccessful. Left a message to contact the ICU. - Inter-disciplinary family meet or Palliative Care meeting due by:  9/8  Critical care time- 40 mins.  Marshell Garfinkel MD [place] [facility] Pager [phone] If no answer or after 3pm call: [phone] 05/20/2016, 8:38 AM

## 2016-05-21 ENCOUNTER — Inpatient Hospital Stay (HOSPITAL_COMMUNITY): Payer: PPO

## 2016-05-21 DIAGNOSIS — E876 Hypokalemia: Secondary | ICD-10-CM | POA: Diagnosis not present

## 2016-05-21 DIAGNOSIS — J9600 Acute respiratory failure, unspecified whether with hypoxia or hypercapnia: Secondary | ICD-10-CM | POA: Diagnosis not present

## 2016-05-21 DIAGNOSIS — J9602 Acute respiratory failure with hypercapnia: Secondary | ICD-10-CM | POA: Diagnosis not present

## 2016-05-21 DIAGNOSIS — J9601 Acute respiratory failure with hypoxia: Secondary | ICD-10-CM | POA: Diagnosis not present

## 2016-05-21 DIAGNOSIS — J9620 Acute and chronic respiratory failure, unspecified whether with hypoxia or hypercapnia: Secondary | ICD-10-CM | POA: Diagnosis not present

## 2016-05-21 DIAGNOSIS — I5032 Chronic diastolic (congestive) heart failure: Secondary | ICD-10-CM | POA: Diagnosis not present

## 2016-05-21 LAB — GLUCOSE, CAPILLARY
GLUCOSE-CAPILLARY: 161 mg/dL — AB (ref 65–99)
GLUCOSE-CAPILLARY: 197 mg/dL — AB (ref 65–99)
GLUCOSE-CAPILLARY: 218 mg/dL — AB (ref 65–99)
GLUCOSE-CAPILLARY: 260 mg/dL — AB (ref 65–99)
Glucose-Capillary: 257 mg/dL — ABNORMAL HIGH (ref 65–99)
Glucose-Capillary: 239 mg/dL — ABNORMAL HIGH (ref 65–99)
Glucose-Capillary: 301 mg/dL — ABNORMAL HIGH (ref 65–99)

## 2016-05-21 LAB — BASIC METABOLIC PANEL
ANION GAP: 6 (ref 5–15)
BUN: 18 mg/dL (ref 6–20)
CHLORIDE: 96 mmol/L — AB (ref 101–111)
CO2: 37 mmol/L — AB (ref 22–32)
Calcium: 8.6 mg/dL — ABNORMAL LOW (ref 8.9–10.3)
Creatinine, Ser: 0.86 mg/dL (ref 0.61–1.24)
GFR calc non Af Amer: >60 mL/min (ref >60)
Glucose, Bld: 208 mg/dL — ABNORMAL HIGH (ref 65–99)
Potassium: 3.9 mmol/L (ref 3.5–5.1)
Sodium: 139 mmol/L (ref 135–145)

## 2016-05-21 LAB — CBC
HCT: 30 % — ABNORMAL LOW (ref 39.0–52.0)
HEMOGLOBIN: 8.4 g/dL — AB (ref 13.0–17.0)
MCH: 25.8 pg — AB (ref 26.0–34.0)
MCHC: 28 g/dL — ABNORMAL LOW (ref 30.0–36.0)
MCV: 92.3 fL (ref 78.0–100.0)
Platelets: 152 10*3/uL (ref 150–400)
RBC: 3.25 MIL/uL — AB (ref 4.22–5.81)
RDW: 13.6 % (ref 11.5–15.5)
WBC: 7.7 10*3/uL (ref 4.0–10.5)

## 2016-05-21 LAB — MAGNESIUM: Magnesium: 1.7 mg/dL (ref 1.7–2.4)

## 2016-05-21 LAB — PROCALCITONIN: Procalcitonin: 1.17 ng/mL

## 2016-05-21 LAB — PHOSPHORUS: Phosphorus: 2 mg/dL — ABNORMAL LOW (ref 2.5–4.6)

## 2016-05-21 MED ORDER — INSULIN GLARGINE 100 UNIT/ML ~~LOC~~ SOLN
10.0000 [IU] | Freq: Every day | SUBCUTANEOUS | Status: DC
  Administered 2016-05-21: 10 [IU] via SUBCUTANEOUS
  Filled 2016-05-21: qty 0.1

## 2016-05-21 MED ORDER — SODIUM PHOSPHATES 45 MMOLE/15ML IV SOLN
30.0000 mmol | Freq: Once | INTRAVENOUS | Status: AC
  Administered 2016-05-21: 30 mmol via INTRAVENOUS
  Filled 2016-05-21: qty 10

## 2016-05-21 MED ORDER — INSULIN NPH (HUMAN) (ISOPHANE) 100 UNIT/ML ~~LOC~~ SUSP
20.0000 [IU] | Freq: Four times a day (QID) | SUBCUTANEOUS | Status: DC
  Administered 2016-05-22 (×2): 20 [IU] via SUBCUTANEOUS
  Filled 2016-05-21: qty 10

## 2016-05-21 MED ORDER — CITALOPRAM HYDROBROMIDE 20 MG PO TABS
20.0000 mg | ORAL_TABLET | Freq: Every day | ORAL | Status: DC
  Administered 2016-05-21 – 2016-05-29 (×8): 20 mg via ORAL
  Filled 2016-05-21 (×9): qty 1

## 2016-05-21 MED ORDER — METHYLPREDNISOLONE SODIUM SUCC 40 MG IJ SOLR
40.0000 mg | Freq: Two times a day (BID) | INTRAMUSCULAR | Status: DC
  Administered 2016-05-21 – 2016-05-22 (×3): 40 mg via INTRAVENOUS
  Filled 2016-05-21 (×4): qty 1

## 2016-05-21 NOTE — Progress Notes (Signed)
PULMONARY / CRITICAL CARE MEDICINE   Name: Rick DESCHENES MRN: [id number] DOB: [birth year]    ADMISSION DATE:  05/17/2016 CONSULTATION DATE:  05/17/2016  REFERRING MD:  Dr Shanon Brow [facility] [name]  CHIEF COMPLAINT:  AMS  HISTORY OF PRESENT ILLNESS:   70 year old male with past medical history as below, which is significant for CHF, OSA on home O2 (he had a sleep study 2016 recommending bi-level 13/7 which he reportedly does not use), COPD, diabetes, schizophrenia, and blindness of the right eye. He presented to [facility] emergency department 8/31 with confusion. Symptoms started at home where she checked his blood sugar and found to be greater than 300. She called 911 and administered 30 units of insulin. Mental status was then worsened, and repeat glucose in the emergency department was 19. He was administered D50 and Narcan, however, he continued to deteriorate complaining of shortness of breath and was eventually intubated. Plan was to admit the patient for hypoxic/hypercarbic respiratory failure, however, daily requested transfer to [facility]. Prior to transfer he apparently improved and was extubated in the emergency department. He was placed on BiPAP.  Of note, he has been admitted several times with similar complaints in the past. He does have a history of altered mental status admissions for various reasons including polypharmacy specifically benzodiazepine overdose, hyponatremia, OSA/OHS decompensation, sepsis, and hypoglycemia.  SUBJECTIVE:  Not doing well on weaning trial  VITAL SIGNS: BP (!) 131/54   Pulse 77   Temp 99.1 F (37.3 C) (Core (Comment))   Resp (!) 24   Ht 6' (1.829 m)   Wt (!) 323 lb 10.2 oz (146.8 kg)   SpO2 94%   BMI 43.89 kg/m   HEMODYNAMICS:    VENTILATOR SETTINGS: Vent Mode: PRVC FiO2 (%):  [40 %] 40 % Set Rate:  [24 bmp] 24 bmp Vt Set:  [id number] mL] 620 mL PEEP:  [5 cmH20] 5 cmH20 Plateau Pressure:  [28 cmH20-31 cmH20] 28 cmH20  INTAKE /  OUTPUT: I/O last 3 completed shifts: In: 6687.5 [I.V.:2077.5; Other:230; NG/GT:2430; IV Piggyback:1950] Out: [id number] [Urine:7560]  PHYSICAL EXAMINATION: General:  Morbidly obese male, no distress Neuro:  Sedated. Moves all four extremities HEENT:  ETT in place Cardiovascular:  RRR, no MRG Lungs:  No wheeze or crackles Abdomen:  Rotund, soft, non-distended Musculoskeletal:  No acute deformity Skin:  Grossly intact.   LABS:  BMET  Recent Labs Lab 05/19/16 0330 05/20/16 0400 05/21/16 0405  NA 133* 135 139  K 3.8 4.3 3.9  CL 92* 97* 96*  CO2 35* 35* 37*  BUN 12 15 18   CREATININE 1.04 0.86 0.86  GLUCOSE 279* 276* 208*    Electrolytes  Recent Labs Lab 05/19/16 0330 05/19/16 1726 05/20/16 0400 05/21/16 0405  CALCIUM 7.6*  --  7.8* 8.6*  MG 2.0 2.1 2.0 1.7  PHOS 2.1* 2.9 2.2* 2.0*    CBC  Recent Labs Lab 05/19/16 0330 05/20/16 0400 05/21/16 0405  WBC 14.2* 8.9 7.7  HGB 8.9* 8.2* 8.4*  HCT 31.7* 28.4* 30.0*  PLT 149* 146* 152    Coag's  Recent Labs Lab 05/18/16 1200  APTT 22*  INR 1.21    Sepsis Markers  Recent Labs Lab 05/17/16 2018 05/18/16 1237  05/18/16 2015 05/19/16 0330 05/20/16 0400 05/21/16 0405  LATICACIDVEN 1.39 2.7*  --  1.4  --   --   --   PROCALCITON  --   --   < >  --  3.53 1.86 1.17  < > =  values in this interval not displayed.  ABG  Recent Labs Lab 05/18/16 0337 05/18/16 0503 05/18/16 0527  PHART 7.327* 7.357 7.377  PCO2ART 75.3* 78.1* 66.7*  PO2ART 68.0* 28.0* 70.0*    Liver Enzymes  Recent Labs Lab 05/17/16 1748 05/18/16 1300 05/19/16 0330  AST 15  --  12*  ALT 9*  --  8*  ALKPHOS 64  --  46  BILITOT 0.2*  --  0.7  ALBUMIN 3.9 2.5* 2.4*    Cardiac Enzymes  Recent Labs Lab 05/17/16 1748 05/18/16 1200  TROPONINI <0.03 0.03*    Glucose  Recent Labs Lab 05/20/16 1225 05/20/16 1601 05/20/16 1958 05/21/16 0043 05/21/16 0356 05/21/16 0722  GLUCAP 333* 264* 254* 218* 197* 257*     Imaging Dg Chest Port 1 View  Result Date: 05/21/2016 CLINICAL DATA:  70 year old male with a history of acute respiratory failure EXAM: PORTABLE CHEST 1 VIEW COMPARISON:  05/20/2016, 05/19/2016, 05/18/2016 FINDINGS: Cardiomediastinal silhouette likely unchanged though partially obscured by overlying lung and pleural disease. Left rotation of the patient significantly degrades the image. Unchanged endotracheal tube terminating approximately 5.3 cm above the carina. Unchanged right IJ central venous catheter. Calcifications of the aorta. Gastric tube unchanged, terminating out of the field of view. Similar appearance of mixed interstitial and airspace opacities of the bilateral lungs without significant improvement. Hazy opacity at the right base likely related the pleural fluid. IMPRESSION: Unchanged endotracheal tube and gastric tube compared to the prior. Unchanged right IJ central venous catheter. Unchanged appearance of the lungs with persisting mixed interstitial and airspace opacities, potentially a combination of atelectasis, edema, consolidation, and/ or pleural effusion. Aortic atherosclerosis. Signed, Dulcy Fanny. Earleen Newport, DO [facility] Electronically Signed   By: Corrie Mckusick D.O.   On: 05/21/2016 08:09   STUDIES:   CULTURES: BCx2 9/1 > Urine 9/1 > Tracheal aspirate 9/1> GPCs  ANTIBIOTICS: Cefepime 8/31 >>> Vancomycin 8/31 >>> Azithromycin 9/1  SIGNIFICANT EVENTS: 8/31 - Presented to OSH ED w/ altered mentation 9/01 - Transfer to [facility] at family request  LINES/TUBES: ETT 8/31, 9/1 >>>  DISCUSSION: 70 year old male with chronic respiratory failure presented 8/31 with decreased LOC. Found to by hypercarbic/hypoxemic requiring intubation. Concern for PNA superimposed on OSA/OHS. Treat with broad spectrum ABX. Close glucose monitoring. Supportive care on vent.   ASSESSMENT / PLAN:  PULMONARY A: Acute on chronic mixed  respiratory failure (extuabted in ED, but now obtunded on BiPAP. On home O2) OSA/OHS decompensation with central apnea/hypoventilation likely playing role with history of polypharmacy and opioids positive on UDS. No clinical response to narcan in ED. COPD without exacerbation R/o asp event from OD? P:   Wean off Peep Follow ABG Daily weaning trials Keep O2 sats 88-94% Scheduled Duoneb  CARDIOVASCULAR A:  Chronic diastolic CHF by echo [id number], could be some acute component contributing top current illness H/o HLD Shock, r/o sirs, sepsis from asp vs OD P:  Off neo Continue lasix for diuresis Hold PO home meds plavix, metoprolol, lasix, zocor, asa initially while NPO Follow Echo  RENAL A:   Hypokalemia Acute renal risk Hypoglycemia improved P:   KVO IVF Replete lytes Monitor urine output  GASTROINTESTINAL A:   No acute issues  P:   NPO Pepcid BID for SUP Continue feeds  HEMATOLOGIC A:   Anemia, Hgb at baseline  P:  Follow CBC SQ heparin for VTE ppx  INFECTIOUS A:   Worrisome for occult infection, likely source PNA - asp unlikely  cap  P:   ABX as above PCT Maintain current regimen  ENDOCRINE A:   DM with insulin pump Hypoglycemia, now hyper P:   CBG monitoring and SSI Change to resistant scale Start lantus 10 units A1C Holding home actos  NEUROLOGIC A:   Acute metabolic encephalopathy Schizophrenia 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 No NMS noted P:   RASS goal: -1 PRN fentanyl and versed. Restart celexa.  Holding home gabapentin, trazadone, norco, geodon - concern OD with mild abdnormal movements  FAMILY  - Updates: No family present. Attempted to contact wife via phone but unsuccessful. Left a message to contact the ICU. - Inter-disciplinary family meet or Palliative Care meeting due by:  9/8  Critical care time- 40 mins.  Marshell Garfinkel  MD [place] [facility] Pager [phone] If no answer or after 3pm call: [phone] 05/21/2016, 8:20 AM

## 2016-05-21 NOTE — Progress Notes (Signed)
[place] Progress Note Patient Name: Rick Cruz DOB: [birth year] MRN: [id number]   Date of Service  05/21/2016  HPI/Events of Note  Notified by bedside nurse and respiratory therapist he is continuing to have peak airway pressure around 40-42. Plateau pressure less than 30. Respiratory therapist reports some mild wheezing along with rhonchi bilaterally. Currently on DuoNeb every 4 hours. Bag lavage yields thick, clear secretions.   eICU Interventions  1. Ensuring head of the bed elevated to approximately 45 2. Continuing DuoNeb every 4 hours 3. Starting chest PT via bed every 4 hours 4. Starting Solu-Medrol 40 mg IV every 12 hours      Intervention Category Major Interventions: Respiratory failure - evaluation and management  Tera Partridge 05/21/2016, 8:13 PM

## 2016-05-21 NOTE — Progress Notes (Signed)
Patient's ECU class ring removed during bath as patient's fingers are very swollen. Ring placed in specimen cup with patient label on it on computer in room.

## 2016-05-21 NOTE — Progress Notes (Signed)
[place] Progress Note Patient Name: Rick Cruz DOB: 04-Feb-1946 MRN: [id number]   Date of Service  05/21/2016  HPI/Events of Note  Notified at bedside nurse of persistent hyperglycemia. Currently on resistant sliding scale insulin with 73 units required in approximately last 24 hours. Currently on Lantus 10 units subcutaneous daily. Utilizes home insulin pump. Receiving tube feeds. Serum creatinine 0.86.   eICU Interventions  1. Discontinue Lantus 2. Start NPH 20 units subcutaneous every 6 hours beginning at midnight 3. Continue Accu-Cheks every 4 hours 4. Continue resistant sliding scale insulin per protocol      Intervention Category Intermediate Interventions: Hyperglycemia - evaluation and treatment  Tera Partridge 05/21/2016, 11:31 PM

## 2016-05-21 NOTE — Progress Notes (Signed)
[place] Progress Note Patient Name: Rick Cruz DOB: 09-Jul-1946 MRN: [id number]   Date of Service  05/21/2016  HPI/Events of Note  hypophosphatemia  eICU Interventions  Phos replaced     Intervention Category Intermediate Interventions: Electrolyte abnormality - evaluation and management  DETERDING,ELIZABETH 05/21/2016, 4:53 AM

## 2016-05-22 ENCOUNTER — Inpatient Hospital Stay (HOSPITAL_COMMUNITY): Payer: PPO

## 2016-05-22 DIAGNOSIS — R0689 Other abnormalities of breathing: Secondary | ICD-10-CM | POA: Diagnosis not present

## 2016-05-22 DIAGNOSIS — J9601 Acute respiratory failure with hypoxia: Secondary | ICD-10-CM | POA: Diagnosis not present

## 2016-05-22 DIAGNOSIS — R4182 Altered mental status, unspecified: Secondary | ICD-10-CM

## 2016-05-22 DIAGNOSIS — G9340 Encephalopathy, unspecified: Secondary | ICD-10-CM | POA: Diagnosis not present

## 2016-05-22 DIAGNOSIS — J9690 Respiratory failure, unspecified, unspecified whether with hypoxia or hypercapnia: Secondary | ICD-10-CM | POA: Diagnosis not present

## 2016-05-22 DIAGNOSIS — J9602 Acute respiratory failure with hypercapnia: Secondary | ICD-10-CM | POA: Diagnosis not present

## 2016-05-22 LAB — BASIC METABOLIC PANEL
Anion gap: 6 (ref 5–15)
BUN: 23 mg/dL — ABNORMAL HIGH (ref 6–20)
CHLORIDE: 94 mmol/L — AB (ref 101–111)
CO2: 37 mmol/L — ABNORMAL HIGH (ref 22–32)
Calcium: 9.2 mg/dL (ref 8.9–10.3)
Creatinine, Ser: 0.9 mg/dL (ref 0.61–1.24)
Glucose, Bld: 271 mg/dL — ABNORMAL HIGH (ref 65–99)
POTASSIUM: 4.6 mmol/L (ref 3.5–5.1)
SODIUM: 137 mmol/L (ref 135–145)

## 2016-05-22 LAB — MAGNESIUM: MAGNESIUM: 1.7 mg/dL (ref 1.7–2.4)

## 2016-05-22 LAB — GLUCOSE, CAPILLARY
GLUCOSE-CAPILLARY: 287 mg/dL — AB (ref 65–99)
GLUCOSE-CAPILLARY: 205 mg/dL — AB (ref 65–99)
GLUCOSE-CAPILLARY: 178 mg/dL — AB (ref 65–99)
Glucose-Capillary: 240 mg/dL — ABNORMAL HIGH (ref 65–99)
Glucose-Capillary: 156 mg/dL — ABNORMAL HIGH (ref 65–99)

## 2016-05-22 LAB — CBC
HCT: 32.6 % — ABNORMAL LOW (ref 39.0–52.0)
Hemoglobin: 9.4 g/dL — ABNORMAL LOW (ref 13.0–17.0)
MCH: 26.2 pg (ref 26.0–34.0)
MCHC: 28.8 g/dL — ABNORMAL LOW (ref 30.0–36.0)
MCV: 90.8 fL (ref 78.0–100.0)
PLATELETS: 161 10*3/uL (ref 150–400)
RBC: 3.59 MIL/uL — AB (ref 4.22–5.81)
RDW: 13.7 % (ref 11.5–15.5)
WBC: 7.4 10*3/uL (ref 4.0–10.5)

## 2016-05-22 LAB — HEMOGLOBIN A1C
Hgb A1c MFr Bld: 7.4 % — ABNORMAL HIGH (ref 4.8–5.6)
Mean Plasma Glucose: 166 mg/dL

## 2016-05-22 LAB — PHOSPHORUS: PHOSPHORUS: 2.6 mg/dL (ref 2.5–4.6)

## 2016-05-22 MED ORDER — NITROGLYCERIN 2 % TD OINT
1.0000 [in_us] | TOPICAL_OINTMENT | Freq: Four times a day (QID) | TRANSDERMAL | Status: DC
  Administered 2016-05-22: 1 [in_us] via TOPICAL
  Filled 2016-05-22: qty 30

## 2016-05-22 MED ORDER — NICARDIPINE HCL IN NACL 20-0.86 MG/200ML-% IV SOLN
3.0000 mg/h | INTRAVENOUS | Status: DC
  Administered 2016-05-23: 5 mg/h via INTRAVENOUS
  Filled 2016-05-22 (×2): qty 200

## 2016-05-22 MED ORDER — INSULIN GLARGINE 100 UNIT/ML ~~LOC~~ SOLN
20.0000 [IU] | Freq: Every day | SUBCUTANEOUS | Status: DC
  Administered 2016-05-23 – 2016-05-29 (×7): 20 [IU] via SUBCUTANEOUS
  Filled 2016-05-22 (×8): qty 0.2

## 2016-05-22 MED ORDER — FUROSEMIDE 10 MG/ML IJ SOLN
40.0000 mg | Freq: Two times a day (BID) | INTRAMUSCULAR | Status: AC
  Administered 2016-05-22 – 2016-05-23 (×2): 40 mg via INTRAVENOUS
  Filled 2016-05-22 (×2): qty 4

## 2016-05-22 MED ORDER — CLONIDINE HCL 0.1 MG/24HR TD PTWK
0.1000 mg | MEDICATED_PATCH | TRANSDERMAL | Status: DC
  Administered 2016-05-22: 0.1 mg via TRANSDERMAL
  Filled 2016-05-22 (×2): qty 1

## 2016-05-22 MED ORDER — DEXTROSE 5 % IV SOLN
1.0000 g | INTRAVENOUS | Status: AC
  Administered 2016-05-22 – 2016-05-23 (×2): 1 g via INTRAVENOUS
  Filled 2016-05-22 (×2): qty 10

## 2016-05-22 MED ORDER — ACETAMINOPHEN 650 MG RE SUPP
650.0000 mg | Freq: Four times a day (QID) | RECTAL | Status: DC | PRN
  Administered 2016-05-22: 650 mg via RECTAL
  Filled 2016-05-22 (×2): qty 1

## 2016-05-22 MED ORDER — MAGNESIUM SULFATE 2 GM/50ML IV SOLN
2.0000 g | Freq: Once | INTRAVENOUS | Status: AC
  Administered 2016-05-22: 2 g via INTRAVENOUS
  Filled 2016-05-22: qty 50

## 2016-05-22 NOTE — Progress Notes (Signed)
[place] ICU Electrolyte Replacement Protocol  Patient Name: Rick Cruz DOB: [birth year] MRN: [id number]  Date of Service  05/22/2016   HPI/Events of Note    Recent Labs Lab 05/18/16 1200  05/18/16 1300  05/19/16 0330 05/19/16 1726 05/20/16 0400 05/21/16 0405 05/22/16 0424  NA 132*  --  133*  --  133*  --  135 139  --   K 5.1  --  5.1  --  3.8  --  4.3 3.9  --   CL 92*  --  93*  --  92*  --  97* 96*  --   CO2 35*  --  35*  --  35*  --  35* 37*  --   GLUCOSE 340*  --  338*  --  279*  --  276* 208*  --   BUN 10  --  10  --  12  --  15 18  --   CREATININE 1.35*  --  1.33*  --  1.04  --  0.86 0.86  --   CALCIUM 7.3*  --  7.3*  --  7.6*  --  7.8* 8.6*  --   MG  --   < >  --   < > 2.0 2.1 2.0 1.7 1.7  PHOS  --   < > <1.0*  < > 2.1* 2.9 2.2* 2.0* 2.6  < > = values in this interval not displayed.  Estimated Creatinine Clearance: 120.2 mL/min (by C-G formula based on SCr of 0.86 mg/dL).  Intake/Output      09/04 0701 - 09/05 0700   I.V. (mL/kg) 505.6 (3.5)   Other 250   NG/GT 1365   IV Piggyback 1293   Total Intake(mL/kg) 3413.6 (23.4)   Urine (mL/kg/hr) 4075 (1.2)   Total Output 4075   Net -661.4        - I/O DETAILED x24h    Total I/O In: 1734.4 [I.V.:294.4; Other:140; NG/GT:650; IV Piggyback:650] Out: 2675 [Urine:2675] - I/O THIS SHIFT    ASSESSMENT   eICURN Interventions  Mg 1.7 Replaced using ICU protocol   ASSESSMENT: [place], Sumit Branham Nicole 05/22/2016, 5:35 AM

## 2016-05-22 NOTE — Progress Notes (Signed)
[place] Progress Note Patient Name: Rick Cruz DOB: 08-Nov-1945 MRN: [id number]   Date of Service  05/22/2016  HPI/Events of Note  Hypertension - BP = 184/86.  eICU Interventions  Will order: 1. Nitropaste 1 inch to skin Q 6 hours. Hold for SBP < 105.     Intervention Category Intermediate Interventions: Hypertension - evaluation and management  Sommer,Steven Eugene 05/22/2016, 9:17 PM

## 2016-05-22 NOTE — Progress Notes (Signed)
[place] Progress Note Patient Name: RISHAV MAYE DOB: [birth year] MRN: [id number]   Date of Service  05/22/2016  HPI/Events of Note  Hypertension - BP = 177/103 in spite of catapres patch and nitroglycerin paste.   eICU Interventions  Will order: 1. Nicardipine IV infusion. Titrate to SBP < 160. 2. D/C Nitropaste.      Intervention Category Major Interventions: Hypertension - evaluation and management  Sommer,Steven Eugene 05/22/2016, 10:50 PM

## 2016-05-22 NOTE — Progress Notes (Signed)
Inpatient Diabetes Program Recommendations  AACE/ADA: New Consensus Statement on Inpatient Glycemic Control (2015)  Target Ranges:  Prepandial:   less than 140 mg/dL      Peak postprandial:   less than 180 mg/dL (1-2 hours)      Critically ill patients:  140 - 180 mg/dL   Results for Rick Cruz, Rick Cruz (MRN [id number]) as of 05/22/2016 10:53  Ref. Range 05/21/2016 15:38 05/21/2016 19:36 05/21/2016 23:24 05/22/2016 03:34 05/22/2016 07:40  Glucose-Capillary Latest Ref Range: 65 - 99 mg/dL 260 (H) 161 (H) 301 (H) 240 (H) 287 (H)    Review of Glycemic Control  Inpatient Diabetes Program Recommendations:  Noted elevated CBGs. Please consider ICU protocol for glycemic control. If not started on ICU protocol, please consider increase in Lantus insulin to 45 units and add tube feeding coverage 3-4 units q 4 hrs. (hold if tube feeding stopped).  Thank you, Nani Gasser. Mayley Lish, RN, MSN, CDE Inpatient Glycemic Control Team Team Pager [phone] (8am-5pm) 05/22/2016 11:11 AM

## 2016-05-22 NOTE — Procedures (Signed)
Extubation Procedure Note  Patient Details:   Name: Rick Cruz DOB: 10-25-1945 MRN: [id number]   Airway Documentation:     Evaluation  O2 sats: stable throughout Complications: No apparent complications Patient did tolerate procedure well. Bilateral Breath Sounds: Diminished, Expiratory wheezes   Yes   Positive cuff leak noted. Pt placed on nasal cannula 4 Lpm with humidity, no stridor noted.  Bayard Beaver 05/22/2016, 9:49 AM

## 2016-05-22 NOTE — Progress Notes (Signed)
RT instructed patient of use of incentive spirometer. Pt able to get 1000 mls.

## 2016-05-22 NOTE — Progress Notes (Signed)
[place] Progress Note Patient Name: Rick Cruz DOB: 06/07/1946 MRN: [id number]   Date of Service  05/22/2016  HPI/Events of Note  Multiple issues: 1. Hypertension - BP = 178/95 and 2. Chronic back pain - no pain medications ordered.  eICU Interventions  Will order: 1. Catapres patch 0.1 mg Q week.  2. Tylenol Suppository 650 mg PR Q 6 hours PRN pain.      Intervention Category Intermediate Interventions: Hypertension - evaluation and management  Tareka Jhaveri Eugene 05/22/2016, 3:25 PM

## 2016-05-22 NOTE — Care Management Important Message (Signed)
Important Message  Patient Details  Name: Rick Cruz MRN: [id number] Date of Birth: 1945-12-19   Medicare Important Message Given:  Other (see comment)    [place], Jaris Kohles Abena 05/22/2016, 4:37 PM

## 2016-05-22 NOTE — Progress Notes (Signed)
210cc of Fentanyl wasted in the sink with Dorena Cookey, RN.

## 2016-05-22 NOTE — Progress Notes (Signed)
PULMONARY / CRITICAL CARE MEDICINE   Name: Rick Cruz MRN: [id number] DOB: 11/29/1945    ADMISSION DATE:  05/17/2016 CONSULTATION DATE:  05/17/2016  REFERRING MD:  Dr Shanon Brow [name]  CHIEF COMPLAINT:  AMS  HISTORY OF PRESENT ILLNESS:   70 year old male with past medical history as below, which is significant for CHF, OSA on home O2 (he had a sleep study 2016 recommending bi-level 13/7 which he reportedly does not use), COPD, diabetes, schizophrenia, and blindness of the right eye. He presented to [facility] emergency department 8/31 with confusion. Symptoms started at home where she checked his blood sugar and found to be greater than 300. She called 911 and administered 30 units of insulin. Mental status was then worsened, and repeat glucose in the emergency department was 19. He was administered D50 and Narcan, however, he continued to deteriorate complaining of shortness of breath and was eventually intubated. Plan was to admit the patient for hypoxic/hypercarbic respiratory failure, however, daily requested transfer to [facility]. Prior to transfer he apparently improved and was extubated in the emergency department. He was placed on BiPAP.  Of note, he has been admitted several times with similar complaints in the past. He does have a history of altered mental status admissions for various reasons including polypharmacy specifically benzodiazepine overdose, hyponatremia, OSA/OHS decompensation, sepsis, and hypoglycemia.  SUBJECTIVE:  Noted to have increased peak pressures last night and wheezing/rhonchi. Started on Solu-medrol and chest PT. Also had persistent hyperglycemia last night, started on NPH Q6H.   VITAL SIGNS: BP 134/74   Pulse 92   Temp 99.1 F (37.3 C) (Core (Comment))   Resp (!) 24   Ht 6' (1.829 m)   Wt (!) 320 lb 15.8 oz (145.6 kg)   SpO2 94%   BMI 43.53 kg/m   HEMODYNAMICS:    VENTILATOR SETTINGS: Vent Mode: PRVC FiO2 (%):  [40 %] 40 % Set  Rate:  [24 bmp] 24 bmp Vt Set:  [620 mL] 620 mL PEEP:  [5 cmH20] 5 cmH20 Pressure Support:  [8 cmH20] 8 cmH20 Plateau Pressure:  [24 cmH20-26 cmH20] 24 cmH20  INTAKE / OUTPUT: I/O last 3 completed shifts: In: 5489.7 [I.V.:848.7; Other:340; [id number]; IV [id number] Out: 7910 [Urine:7910]  PHYSICAL EXAMINATION: General:  Morbidly obese man, sitting up in bed on vent, NAD Neuro: Moves all four extremities. Follows some commands HEENT:  ETT in place Cardiovascular:  RRR, no m/g/r Lungs: CTA anteriorly, no wheezing or rhonchi appreciated  Abdomen: soft, obese, non-tender  Musculoskeletal:  No acute deformity Skin:  Grossly intact.   LABS:  BMET  Recent Labs Lab 05/20/16 0400 05/21/16 0405 05/22/16 0424  NA 135 139 137  K 4.3 3.9 4.6  CL 97* 96* 94*  CO2 35* 37* 37*  BUN 15 18 23*  CREATININE 0.86 0.86 0.90  GLUCOSE 276* 208* 271*    Electrolytes  Recent Labs Lab 05/20/16 0400 05/21/16 0405 05/22/16 0424  CALCIUM 7.8* 8.6* 9.2  MG 2.0 1.7 1.7  PHOS 2.2* 2.0* 2.6    CBC  Recent Labs Lab 05/20/16 0400 05/21/16 0405 05/22/16 0424  WBC 8.9 7.7 7.4  HGB 8.2* 8.4* 9.4*  HCT 28.4* 30.0* 32.6*  PLT 146* 152 161    Coag's  Recent Labs Lab 05/18/16 1200  APTT 22*  INR 1.21    Sepsis Markers  Recent Labs Lab 05/17/16 2018 05/18/16 1237  05/18/16 2015 05/19/16 0330 05/20/16 0400 05/21/16 0405  LATICACIDVEN 1.39 2.7*  --  1.4  --   --   --  PROCALCITON  --   --   < >  --  3.53 1.86 1.17  < > = values in this interval not displayed.  ABG  Recent Labs Lab 05/18/16 0337 05/18/16 0503 05/18/16 0527  PHART 7.327* 7.357 7.377  PCO2ART 75.3* 78.1* 66.7*  PO2ART 68.0* 28.0* 70.0*    Liver Enzymes  Recent Labs Lab 05/17/16 1748 05/18/16 1300 05/19/16 0330  AST 15  --  12*  ALT 9*  --  8*  ALKPHOS 64  --  46  BILITOT 0.2*  --  0.7  ALBUMIN 3.9 2.5* 2.4*    Cardiac Enzymes  Recent Labs Lab 05/17/16 1748 05/18/16 1200   TROPONINI <0.03 0.03*    Glucose  Recent Labs Lab 05/21/16 0722 05/21/16 1132 05/21/16 1538 05/21/16 1936 05/21/16 2324 05/22/16 0334  GLUCAP 257* 239* 260* 161* 301* 240*    Imaging No results found. STUDIES:   CULTURES: BCx2 9/1 > Urine 9/1 > Tracheal aspirate 9/1> GPCs  ANTIBIOTICS: Cefepime 8/31 >>>9/5 Vancomycin 8/31 >>>9/5 Azithromycin 9/1>>>9/2  SIGNIFICANT EVENTS: 8/31 - Presented to OSH ED w/ altered mentation 9/01 - Transfer to [facility] at family request  LINES/TUBES: ETT 8/31, 9/1 >>>  DISCUSSION: 70 year old male with chronic respiratory failure presented 8/31 with decreased LOC. Found to by hypercarbic/hypoxemic requiring intubation. Concern for PNA superimposed on OSA/OHS. Treat with broad spectrum ABX. Close glucose monitoring. Supportive care on vent.   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 On home O2) OSA/OHS decompensation with central apnea/hypoventilation likely playing role with history of polypharmacy and opioids positive on UDS. No clinical response to narcan in ED. COPD without exacerbation Asp PNA from OD? P:   Wean off Peep Consider rate reduction  Daily weaning trials Keep O2 sats 88-94% Scheduled Duoneb Solu-medrol 40 Q12H Continue antibiotics as below Neg balance  CARDIOVASCULAR A:  Chronic diastolic CHF by echo [id number], could be some acute component contributing to current illness H/o HLD Shock, r/o sirs, sepsis from asp vs OD P:  Continue lasix for diuresis Hold PO home meds plavix, metoprolol, lasix, zocor, asa initially while NPO  RENAL A:   Hypokalemia-resolved Hypoglycemia improved P:   KVO IVF Replete lytes Monitor urine output May need lasix reduction  GASTROINTESTINAL A:   No acute issues  P:   NPO Pepcid BID for SUP Continue feeds  HEMATOLOGIC A:   Anemia, Hgb at baseline  P:  Follow CBC SQ heparin for VTE  ppx  INFECTIOUS A:   Worrisome for occult infection, likely source PNA - asp Fever last night to 100.6  P:   ABX as above Dc Cefepime Dc Vancomycin Ceftriaxone, add stop date  ENDOCRINE A:   DM with insulin pump Hypoglycemia, now hyper P:   CBG monitoring and SSI Continue resistant scale Dc NPH Q6H Lantus re add 20 A1C Holding home actos  NEUROLOGIC A:   Acute metabolic encephalopathy Schizophrenia 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 No NMS noted P:   RASS goal: -1 PRN fentanyl and versed. Continue celexa.  Holding home gabapentin, trazadone, norco, geodon - concern OD with mild abdnormal movements  FAMILY  - Updates: No family present. Attempted to contact wife via phone but unsuccessful. Left a message to contact the ICU. - Inter-disciplinary family meet or Palliative Care meeting due by:  9/8  Albin Felling, MD, MPH  Internal Medicine Resident, PGY-III Pager: [phone]  STAFF NOTE: Linwood Dibbles, MD FACP have personally reviewed patient's available data, including medical history, events of note, physical examination and test results as part of my evaluation. I have discussed with resident/NP and other care providers such as pharmacist, RN and RRT. In addition, I personally evaluated patient and elicited key findings of: awake, follows commands, ronchi improved rt , weaning aggressive cpap 5 ps5, , reduce sedation further, pcxr improved overall, mild temp, pct down, no mrsa , narrow off current abx to ceftriaxone, add stop date in total 7-8 days, upright, chair position, lasix x 2 more doses, will need psych evalu likely  The patient is critically ill with multiple organ systems failure and requires high complexity decision making for assessment and support, frequent evaluation and titration of therapies, application of advanced monitoring technologies and extensive  interpretation of multiple databases.   Critical Care Time devoted to patient care services described in this note is 30- Minutes. This time reflects time of care of this signee: Merrie Roof, MD FACP. This critical care time does not reflect procedure time, or teaching time or supervisory time of PA/NP/Med student/Med Resident etc but could involve care discussion time. Rest per NP/medical resident whose note is outlined above and that I agree with   Lavon Paganini. Titus Mould, MD, [place] Pgr: [facility] 05/22/2016 9:03 AM

## 2016-05-22 NOTE — Progress Notes (Addendum)
Patient instructed on the use of flutter valve. Pt was able to demonstrate back good effort.

## 2016-05-23 ENCOUNTER — Inpatient Hospital Stay (HOSPITAL_COMMUNITY): Payer: PPO

## 2016-05-23 DIAGNOSIS — J9621 Acute and chronic respiratory failure with hypoxia: Secondary | ICD-10-CM | POA: Diagnosis not present

## 2016-05-23 DIAGNOSIS — J9601 Acute respiratory failure with hypoxia: Secondary | ICD-10-CM | POA: Diagnosis not present

## 2016-05-23 DIAGNOSIS — J9622 Acute and chronic respiratory failure with hypercapnia: Secondary | ICD-10-CM | POA: Diagnosis not present

## 2016-05-23 DIAGNOSIS — J9602 Acute respiratory failure with hypercapnia: Secondary | ICD-10-CM | POA: Diagnosis not present

## 2016-05-23 DIAGNOSIS — F200 Paranoid schizophrenia: Secondary | ICD-10-CM | POA: Diagnosis not present

## 2016-05-23 DIAGNOSIS — R1310 Dysphagia, unspecified: Secondary | ICD-10-CM | POA: Diagnosis not present

## 2016-05-23 DIAGNOSIS — E662 Morbid (severe) obesity with alveolar hypoventilation: Secondary | ICD-10-CM | POA: Diagnosis not present

## 2016-05-23 DIAGNOSIS — R140 Abdominal distension (gaseous): Secondary | ICD-10-CM | POA: Diagnosis not present

## 2016-05-23 DIAGNOSIS — R579 Shock, unspecified: Secondary | ICD-10-CM | POA: Diagnosis not present

## 2016-05-23 DIAGNOSIS — A419 Sepsis, unspecified organism: Secondary | ICD-10-CM | POA: Diagnosis not present

## 2016-05-23 DIAGNOSIS — Z6841 Body Mass Index (BMI) 40.0 and over, adult: Secondary | ICD-10-CM | POA: Diagnosis not present

## 2016-05-23 DIAGNOSIS — Z9119 Patient's noncompliance with other medical treatment and regimen: Secondary | ICD-10-CM

## 2016-05-23 DIAGNOSIS — I5032 Chronic diastolic (congestive) heart failure: Secondary | ICD-10-CM | POA: Diagnosis not present

## 2016-05-23 DIAGNOSIS — Z4682 Encounter for fitting and adjustment of non-vascular catheter: Secondary | ICD-10-CM | POA: Diagnosis not present

## 2016-05-23 DIAGNOSIS — J690 Pneumonitis due to inhalation of food and vomit: Secondary | ICD-10-CM | POA: Diagnosis not present

## 2016-05-23 DIAGNOSIS — N179 Acute kidney failure, unspecified: Secondary | ICD-10-CM | POA: Diagnosis not present

## 2016-05-23 DIAGNOSIS — G9341 Metabolic encephalopathy: Secondary | ICD-10-CM | POA: Diagnosis not present

## 2016-05-23 LAB — BLOOD GAS, ARTERIAL
ACID-BASE EXCESS: 13.8 mmol/L — AB (ref 0.0–2.0)
BICARBONATE: 39.5 mmol/L — AB (ref 20.0–28.0)
DRAWN BY: 418751
FIO2: 100
O2 Content: 15 L/min
O2 Saturation: 79.4 %
PATIENT TEMPERATURE: 98.6
pCO2 arterial: 67 mmHg (ref 32.0–48.0)
pH, Arterial: 7.388 (ref 7.350–7.450)
pO2, Arterial: 49 mmHg — ABNORMAL LOW (ref 83.0–108.0)

## 2016-05-23 LAB — CBC
HCT: 35.4 % — ABNORMAL LOW (ref 39.0–52.0)
HEMOGLOBIN: 10 g/dL — AB (ref 13.0–17.0)
MCH: 26.1 pg (ref 26.0–34.0)
MCHC: 28.2 g/dL — AB (ref 30.0–36.0)
MCV: 92.4 fL (ref 78.0–100.0)
Platelets: 236 10*3/uL (ref 150–400)
RBC: 3.83 MIL/uL — AB (ref 4.22–5.81)
RDW: 13.9 % (ref 11.5–15.5)
WBC: 13.6 10*3/uL — ABNORMAL HIGH (ref 4.0–10.5)

## 2016-05-23 LAB — MAGNESIUM
MAGNESIUM: 1.9 mg/dL (ref 1.7–2.4)
Magnesium: 1.9 mg/dL (ref 1.7–2.4)

## 2016-05-23 LAB — POCT I-STAT 3, ART BLOOD GAS (G3+)
Acid-Base Excess: 18 mmol/L — ABNORMAL HIGH (ref 0.0–2.0)
Bicarbonate: 45.2 mmol/L — ABNORMAL HIGH (ref 20.0–28.0)
O2 SAT: 100 %
PCO2 ART: 64.1 mmHg — AB (ref 32.0–48.0)
TCO2: 47 mmol/L (ref 0–100)
pH, Arterial: 7.456 — ABNORMAL HIGH (ref 7.350–7.450)
pO2, Arterial: 180 mmHg — ABNORMAL HIGH (ref 83.0–108.0)

## 2016-05-23 LAB — GLUCOSE, CAPILLARY
GLUCOSE-CAPILLARY: 186 mg/dL — AB (ref 65–99)
GLUCOSE-CAPILLARY: 169 mg/dL — AB (ref 65–99)
GLUCOSE-CAPILLARY: 174 mg/dL — AB (ref 65–99)
Glucose-Capillary: 160 mg/dL — ABNORMAL HIGH (ref 65–99)
Glucose-Capillary: 171 mg/dL — ABNORMAL HIGH (ref 65–99)
Glucose-Capillary: 183 mg/dL — ABNORMAL HIGH (ref 65–99)

## 2016-05-23 LAB — CULTURE, BLOOD (ROUTINE X 2)
Culture: NO GROWTH
Culture: NO GROWTH

## 2016-05-23 LAB — BASIC METABOLIC PANEL
Anion gap: 12 (ref 5–15)
BUN: 22 mg/dL — AB (ref 6–20)
CHLORIDE: 92 mmol/L — AB (ref 101–111)
CO2: 38 mmol/L — AB (ref 22–32)
CREATININE: 0.99 mg/dL (ref 0.61–1.24)
Calcium: 9.5 mg/dL (ref 8.9–10.3)
GFR calc Af Amer: >60 mL/min (ref >60)
GFR calc non Af Amer: >60 mL/min (ref >60)
GLUCOSE: 183 mg/dL — AB (ref 65–99)
POTASSIUM: 4.2 mmol/L (ref 3.5–5.1)
Sodium: 142 mmol/L (ref 135–145)

## 2016-05-23 LAB — PHOSPHORUS
PHOSPHORUS: 3.5 mg/dL (ref 2.5–4.6)
PHOSPHORUS: 3.8 mg/dL (ref 2.5–4.6)

## 2016-05-23 MED ORDER — DILTIAZEM HCL 60 MG PO TABS
60.0000 mg | ORAL_TABLET | Freq: Four times a day (QID) | ORAL | Status: DC
  Administered 2016-05-23 – 2016-05-26 (×12): 60 mg via ORAL
  Filled 2016-05-23 (×14): qty 1

## 2016-05-23 MED ORDER — PRO-STAT SUGAR FREE PO LIQD
30.0000 mL | Freq: Two times a day (BID) | ORAL | Status: DC
  Administered 2016-05-23: 30 mL
  Filled 2016-05-23 (×2): qty 30

## 2016-05-23 MED ORDER — TRAZODONE HCL 150 MG PO TABS
150.0000 mg | ORAL_TABLET | Freq: Every day | ORAL | Status: DC
  Filled 2016-05-23: qty 1

## 2016-05-23 MED ORDER — LORAZEPAM 2 MG/ML IJ SOLN
0.5000 mg | INTRAMUSCULAR | Status: DC | PRN
  Administered 2016-05-23 – 2016-05-25 (×3): 1 mg via INTRAVENOUS
  Filled 2016-05-23 (×3): qty 1

## 2016-05-23 MED ORDER — VITAL HIGH PROTEIN PO LIQD
1000.0000 mL | ORAL | Status: DC
  Filled 2016-05-23 (×2): qty 1000

## 2016-05-23 MED ORDER — DILTIAZEM HCL 100 MG IV SOLR
5.0000 mg/h | INTRAVENOUS | Status: DC
  Administered 2016-05-23: 10 mg/h via INTRAVENOUS
  Administered 2016-05-23: 5 mg/h via INTRAVENOUS
  Filled 2016-05-23 (×2): qty 100

## 2016-05-23 MED ORDER — APIXABAN 5 MG PO TABS
5.0000 mg | ORAL_TABLET | Freq: Two times a day (BID) | ORAL | Status: DC
  Administered 2016-05-23 – 2016-05-29 (×13): 5 mg via ORAL
  Filled 2016-05-23 (×13): qty 1

## 2016-05-23 MED ORDER — DILTIAZEM LOAD VIA INFUSION
20.0000 mg | Freq: Once | INTRAVENOUS | Status: AC
  Administered 2016-05-23: 20 mg via INTRAVENOUS
  Filled 2016-05-23: qty 20

## 2016-05-23 MED ORDER — FUROSEMIDE 10 MG/ML IJ SOLN
20.0000 mg | Freq: Every day | INTRAMUSCULAR | Status: DC
  Administered 2016-05-23 – 2016-05-26 (×4): 20 mg via INTRAVENOUS
  Filled 2016-05-23 (×4): qty 2

## 2016-05-23 NOTE — Consult Note (Signed)
[place] [facility] Consult   Reason for Consult:  Accidental overdose of insulin and pain medication and AMS Referring Physician:  Dr. Ashok Cordia Patient Identification: Rick Cruz MRN:  [id number] Principal Diagnosis: Acute respiratory failure with hypoxia and hypercarbia [facility]) Diagnosis:   Patient Active Problem List   Diagnosis Date Noted  . Hypercapnemia [R06.89]   . Hypoglycemia [E16.2] 05/17/2016  . Aspiration pneumonia ([place]) [J69.0] 08/16/2015  . Lorazepam overdose [T42.4X1A] 08/16/2015  . Obesity hypoventilation syndrome ([place]) [E66.2] 04/05/2015  . CSA (central sleep apnea) [G47.31] 04/05/2015  . Non compliance with medical treatment [Z91.19] 04/05/2015  . Acute respiratory failure with hypoxia and hypercarbia (HCC) [J96.01, J96.02]   . Acute on chronic diastolic CHF (congestive heart failure) ([place]) [I50.33] 08/06/2014  . Encephalopathy, metabolic [W09.81] 19/14/7829  . COPD with acute exacerbation ([place]) [J44.1] 08/04/2014  . COPD (chronic obstructive pulmonary disease) ([place]) [J44.9] 07/14/2014  . Recurrent falls [R29.6] 05/08/2014  . Ankle fracture, right [S82.891A] 05/08/2014  . Ankle fracture, left [S82.892A] 05/08/2014  . Acute respiratory failure with hypercapnia ([place]) [J96.02] 05/08/2014  . C. difficile colitis [A04.7] 01/04/2014  . Delirium [R41.0] 01/02/2014  . Acute encephalopathy [G93.40] 01/02/2014  . Morbid obesity ([place]) [E66.01] 06/17/2013  . Erosive esophagitis [K20.8] 04/22/2013  . Hyperkalemia [E87.5] 04/22/2013  . Chronic respiratory failure 02 dep with hypercarbia [J96.10] 04/22/2013  . DM (diabetes mellitus) ([place]) [E11.9] 04/21/2013  . COPD exacerbation ([place]) [J44.1] 04/18/2013  . Paranoid schizophrenia ([place]) [F20.0] 04/18/2013  . CAD (coronary artery disease) [I25.10] 04/18/2013  . Dyspnea [R06.00] 08/26/2008  . DYSLIPIDEMIA [E78.5] 08/25/2008  . Obstructive sleep apnea [G47.33] 08/25/2008  . Essential hypertension [I10] 08/25/2008     Total Time spent with patient: 1 hour  Subjective:   Rick Cruz is a 70 y.o. male patient admitted with confusion/AMS.  HPI:  Rick Cruz is a 70 years old male admitted to [facility] with increased symptoms of confusion/altered mental status. Patient reportedly given overdose of insulin by his son and reportedly he was taken excessive pain medication. Patient seen, chart reviewed for this face-to-face psychiatric consultation and evaluation. Patient is awake, alert, oriented to time place and person. Patient reportedly recovered from altered mental status/confusion at this time. Patient reportedly has no known mental illness including depression, anxiety, bipolar mania, psychosis. Patient denied passive or active suicidal ideation, intention or plans. Patient has no evidence of auditory/visual hallucinations, delusions and paranoia. Patient can't contract for safety while in the hospital.   Medical history: This is a 70 year old male with past medical history as below, which is significant for CHF, OSA on home O2 (he had a sleep study 2016 recommending bi-level 13/7 which he reportedly does not use), COPD, diabetes, 70 year old male with past medical history as below, which is significant for CHF, OSA on home O2 (he had a sleep study 2016 recommending bi-level 13/7 which he reportedly does not use), COPD, diabetes, schizophrenia, and blindness of the right eye. He presented to [facility] emergency department 8/31 with confusion. Symptoms started at home where she checked his blood sugar and found to be greater than 300. She called 911 and administered 30 units of insulin. Mental status was then worsened, and repeat glucose in the emergency department was 19. He was administered D50 and Narcan, however, he continued to deteriorate complaining of shortness of breath and was eventually intubated. Plan was to admit the patient for hypoxic/hypercarbic respiratory failure, however, daily  requested transfer to [facility]. Prior to transfer he apparently improved  and was extubated in the emergency department. He was placed on BiPAP.  Past past psychiatric history: Patient denied current symptoms of schizophrenia and reportedly has no previous acute psychiatric hospitalization. Patient has no known psychotropic medication treatment. Review of electronic medical records indicated patient has diagnosis of schizophrenia but no indication of ever been treated.  Risk to Self:   Risk to Others:   Prior Inpatient Therapy:   Prior Outpatient Therapy:    Past Medical History:  Past Medical History:  Diagnosis Date  . 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 CHF (congestive heart failure) (HCC)    diastolic   . Chronic pain   . COPD (chronic obstructive pulmonary disease) (HCC)   . Depression   . Diabetes mellitus without complication (HCC)    insulin pump  . Hypercholesterolemia   . On home O2    3 liters  . Schizophrenia (HCC)   . Sleep apnea    noncompliant with BiPAP  . Tobacco abuse     Past Surgical History:  Procedure Laterality Date  . COLON SURGERY  March 2010   secondary to large colon polyp, final path per discharge summary notes tubulovillous adenoma.   . COLONOSCOPY  July 2011   Dr. Elnoria Howard: multiple polyps, internal and external hemorrhoids, diverticulosis. tubular adenoma  . ESOPHAGOGASTRODUODENOSCOPY (EGD) WITH PROPOFOL N/A 04/20/2013   Procedure: ESOPHAGOGASTRODUODENOSCOPY (EGD) WITH PROPOFOL;  Surgeon: Corbin Ade, MD;  Location: [facility];  Service: Endoscopy;  Laterality: N/A;  . KNEE SURGERY     X2   Family History:  Family History  Problem Relation Age of Onset  . Thyroid disease Mother   . Parkinson's disease Father   . Parkinson's disease    . Colon cancer Neg Hx    Family Psychiatric  History: Patient denied family history of mental illnesscial History:  History  Alcohol Use No    Comment: history of ETOH abuse in remote past, none  in at least 10 years     History  Drug Use No    Social History   Social History  . Marital status: Married    Spouse name: N/A  . Number of children: N/A  . Years of education: N/A   Social History Main Topics  . Smoking status: Former Smoker    Packs/day: 1.00    Years: 50.00    Types: Cigarettes    Quit date: 09/16/2013  . Smokeless tobacco: Never Used  . Alcohol use No     Comment: history of ETOH abuse in remote past, none in at least 10 years  . Drug use: No  . Sexual activity: Not Asked   Other Topics Concern  . None   Social History Narrative   Drinks about 4 cups of caffeine daily. 2 cups of coffee and 2 coke zeros.   Additional Social History:    Allergies:   Allergies  Allergen Reactions  . Phenergan [Promethazine Hcl] Other (See Comments)    Becomes very confused and aggressive  . Haldol [Haloperidol Lactate] Other (See Comments)    Shaking   . Metformin And Related Diarrhea    Labs:  Results for orders placed or performed during the hospital encounter of 05/17/16 (from the past 48 hour(s))  Glucose, capillary     Status: Abnormal   Collection Time: 05/21/16  3:38 PM  Result Value Ref Range   Glucose-Capillary 260 (H) 65 - 99 mg/dL   Comment 1 Notify RN    Comment  2 Document in Chart   Glucose, capillary     Status: Abnormal   Collection Time: 05/21/16  7:36 PM  Result Value Ref Range   Glucose-Capillary 161 (H) 65 - 99 mg/dL   Comment 1 Notify RN   Glucose, capillary     Status: Abnormal   Collection Time: 05/21/16 11:24 PM  Result Value Ref Range   Glucose-Capillary 301 (H) 65 - 99 mg/dL   Comment 1 Notify RN    Comment 2 Document in Chart   Glucose, capillary     Status: Abnormal   Collection Time: 05/22/16  3:34 AM  Result Value Ref Range   Glucose-Capillary 240 (H) 65 - 99 mg/dL   Comment 1 Notify RN    Comment 2 Document in Chart   CBC     Status: Abnormal   Collection Time: 05/22/16  4:24 AM  Result Value Ref Range   WBC 7.4  4.0 - 10.5 K/uL   RBC 3.59 (L) 4.22 - 5.81 MIL/uL   Hemoglobin 9.4 (L) 13.0 - 17.0 g/dL   HCT 32.6 (L) 39.0 - 52.0 %   MCV 90.8 78.0 - 100.0 fL   MCH 26.2 26.0 - 34.0 pg   MCHC 28.8 (L) 30.0 - 36.0 g/dL   RDW 13.7 11.5 - 15.5 %   Platelets 161 150 - 400 K/uL  Magnesium     Status: None   Collection Time: 05/22/16  4:24 AM  Result Value Ref Range   Magnesium 1.7 1.7 - 2.4 mg/dL  Phosphorus     Status: None   Collection Time: 05/22/16  4:24 AM  Result Value Ref Range   Phosphorus 2.6 2.5 - 4.6 mg/dL  Basic metabolic panel     Status: Abnormal   Collection Time: 05/22/16  4:24 AM  Result Value Ref Range   Sodium 137 135 - 145 mmol/L   Potassium 4.6 3.5 - 5.1 mmol/L   Chloride 94 (L) 101 - 111 mmol/L   CO2 37 (H) 22 - 32 mmol/L   Glucose, Bld 271 (H) 65 - 99 mg/dL   BUN 23 (H) 6 - 20 mg/dL   Creatinine, Ser 0.90 0.61 - 1.24 mg/dL   Calcium 9.2 8.9 - 10.3 mg/dL   GFR calc non Af Amer >60 >60 mL/min   GFR calc Af Amer >60 >60 mL/min    Comment: (NOTE) The eGFR has been calculated using the CKD EPI equation. This calculation has not been validated in all clinical situations. eGFR's persistently <60 mL/min signify possible Chronic Kidney Disease.    Anion gap 6 5 - 15  Glucose, capillary     Status: Abnormal   Collection Time: 05/22/16  7:40 AM  Result Value Ref Range   Glucose-Capillary 287 (H) 65 - 99 mg/dL   Comment 1 Document in Chart   Glucose, capillary     Status: Abnormal   Collection Time: 05/22/16 11:34 AM  Result Value Ref Range   Glucose-Capillary 205 (H) 65 - 99 mg/dL   Comment 1 Document in Chart   Glucose, capillary     Status: Abnormal   Collection Time: 05/22/16  3:23 PM  Result Value Ref Range   Glucose-Capillary 156 (H) 65 - 99 mg/dL   Comment 1 Document in Chart   Glucose, capillary     Status: Abnormal   Collection Time: 05/22/16  7:33 PM  Result Value Ref Range   Glucose-Capillary 178 (H) 65 - 99 mg/dL   Comment 1 Notify RN  Glucose, capillary      Status: Abnormal   Collection Time: 05/23/16 12:06 AM  Result Value Ref Range   Glucose-Capillary 160 (H) 65 - 99 mg/dL   Comment 1 Notify RN   Blood gas, arterial     Status: Abnormal   Collection Time: 05/23/16  3:45 AM  Result Value Ref Range   FIO2 100.00    O2 Content 15.0 L/min   Delivery systems NON-REBREATHER OXYGEN MASK    pH, Arterial 7.388 7.350 - 7.450   pCO2 arterial 67.0 (HH) 32.0 - 48.0 mmHg    Comment: CRITICAL RESULT CALLED TO, READ BACK BY AND VERIFIED WITH: DOMINIQUE MACKEY, RRT, RCP AT 0402 BY MIRIAM TURRIFF, RRT, RCP ON 05/23/2016    pO2, Arterial 49.0 (L) 83.0 - 108.0 mmHg   Bicarbonate 39.5 (H) 20.0 - 28.0 mmol/L   Acid-Base Excess 13.8 (H) 0.0 - 2.0 mmol/L   O2 Saturation 79.4 %   Patient temperature 98.6    Collection site LEFT RADIAL    Drawn by [id number]    Sample type ARTERIAL DRAW    Allens test (pass/fail) PASS PASS  Glucose, capillary     Status: Abnormal   Collection Time: 05/23/16  4:02 AM  Result Value Ref Range   Glucose-Capillary 186 (H) 65 - 99 mg/dL  I-STAT 3, arterial blood gas (G3+)     Status: Abnormal   Collection Time: 05/23/16  4:11 AM  Result Value Ref Range   pH, Arterial 7.456 (H) 7.350 - 7.450   pCO2 arterial 64.1 (H) 32.0 - 48.0 mmHg   pO2, Arterial 180.0 (H) 83.0 - 108.0 mmHg   Bicarbonate 45.2 (H) 20.0 - 28.0 mmol/L   TCO2 47 0 - 100 mmol/L   O2 Saturation 100.0 %   Acid-Base Excess 18.0 (H) 0.0 - 2.0 mmol/L   Patient temperature HIDE    Collection site RADIAL, ALLEN'S TEST ACCEPTABLE    Drawn by RT    Sample type ARTERIAL   CBC     Status: Abnormal   Collection Time: 05/23/16  7:30 AM  Result Value Ref Range   WBC 13.6 (H) 4.0 - 10.5 K/uL   RBC 3.83 (L) 4.22 - 5.81 MIL/uL   Hemoglobin 10.0 (L) 13.0 - 17.0 g/dL   HCT 35.4 (L) 39.0 - 52.0 %   MCV 92.4 78.0 - 100.0 fL   MCH 26.1 26.0 - 34.0 pg   MCHC 28.2 (L) 30.0 - 36.0 g/dL   RDW 13.9 11.5 - 15.5 %   Platelets 236 150 - 400 K/uL  Basic metabolic panel     Status:  Abnormal   Collection Time: 05/23/16  7:30 AM  Result Value Ref Range   Sodium 142 135 - 145 mmol/L   Potassium 4.2 3.5 - 5.1 mmol/L   Chloride 92 (L) 101 - 111 mmol/L   CO2 38 (H) 22 - 32 mmol/L   Glucose, Bld 183 (H) 65 - 99 mg/dL   BUN 22 (H) 6 - 20 mg/dL   Creatinine, Ser 0.99 0.61 - 1.24 mg/dL   Calcium 9.5 8.9 - 10.3 mg/dL   GFR calc non Af Amer >60 >60 mL/min   GFR calc Af Amer >60 >60 mL/min    Comment: (NOTE) The eGFR has been calculated using the CKD EPI equation. This calculation has not been validated in all clinical situations. eGFR's persistently <60 mL/min signify possible Chronic Kidney Disease.    Anion gap 12 5 - 15  Glucose, capillary  Status: Abnormal   Collection Time: 05/23/16  7:50 AM  Result Value Ref Range   Glucose-Capillary 183 (H) 65 - 99 mg/dL   Comment 1 Document in Chart   Glucose, capillary     Status: Abnormal   Collection Time: 05/23/16 11:17 AM  Result Value Ref Range   Glucose-Capillary 169 (H) 65 - 99 mg/dL   Comment 1 Document in Chart   Magnesium     Status: None   Collection Time: 05/23/16 11:47 AM  Result Value Ref Range   Magnesium 1.9 1.7 - 2.4 mg/dL  Phosphorus     Status: None   Collection Time: 05/23/16 11:47 AM  Result Value Ref Range   Phosphorus 3.5 2.5 - 4.6 mg/dL    Current Facility-Administered Medications  Medication Dose Route Frequency Provider Last Rate Last Dose  . 0.9 %  sodium chloride infusion  250 mL Intravenous PRN Roslynn Amble, MD      . acetaminophen (TYLENOL) suppository 650 mg  650 mg Rectal Q6H PRN Karl Ito, MD   650 mg at 05/22/16 1759  . apixaban (ELIQUIS) tablet 5 mg  5 mg Oral BID Nelda Bucks, MD   5 mg at 05/23/16 1202  . chlorhexidine (PERIDEX) 0.12 % solution 15 mL  15 mL Mouth Rinse BID Roslynn Amble, MD   15 mL at 05/23/16 0800  . citalopram (CELEXA) tablet 20 mg  20 mg Oral Daily Praveen Mannam, MD   20 mg at 05/23/16 1203  . cloNIDine (CATAPRES - Dosed in mg/24 hr)  patch 0.1 mg  0.1 mg Transdermal Weekly Karl Ito, MD   0.1 mg at 05/22/16 1627  . diltiazem (CARDIZEM) 100 mg in dextrose 5 % 100 mL (1 mg/mL) infusion  5-15 mg/hr Intravenous Titrated Merwyn Katos, MD 5 mL/hr at 05/23/16 1256 5 mg/hr at 05/23/16 1256  . diltiazem (CARDIZEM) tablet 60 mg  60 mg Oral Q6H Nelda Bucks, MD   60 mg at 05/23/16 1203  . famotidine (PEPCID) IVPB 20 mg premix  20 mg Intravenous Q12H Roslynn Amble, MD   20 mg at 05/23/16 1022  . feeding supplement (PRO-STAT SUGAR FREE 64) liquid 30 mL  30 mL Per Tube BID Nelda Bucks, MD   Stopped at 05/23/16 1000  . feeding supplement (VITAL HIGH PROTEIN) liquid 1,000 mL  1,000 mL Per Tube Q24H Nelda Bucks, MD   Stopped at 05/23/16 1000  . furosemide (LASIX) injection 20 mg  20 mg Intravenous Daily Nelda Bucks, MD   20 mg at 05/23/16 1022  . insulin aspart (novoLOG) injection 0-20 Units  0-20 Units Subcutaneous Q4H Praveen Mannam, MD   4 Units at 05/23/16 1255  . insulin glargine (LANTUS) injection 20 Units  20 Units Subcutaneous Daily Nelda Bucks, MD   20 Units at 05/23/16 1022  . ipratropium-albuterol (DUONEB) 0.5-2.5 (3) MG/3ML nebulizer solution 3 mL  3 mL Nebulization Q4H Roslynn Amble, MD   3 mL at 05/23/16 1249  . LORazepam (ATIVAN) injection 0.5-1 mg  0.5-1 mg Intravenous Q4H PRN Merwyn Katos, MD   1 mg at 05/23/16 0051  . MEDLINE mouth rinse  15 mL Mouth Rinse QID Roslynn Amble, MD   15 mL at 05/23/16 1142  . ondansetron (ZOFRAN) injection 4 mg  4 mg Intravenous Q6H PRN Roslynn Amble, MD        Musculoskeletal: Strength & Muscle Tone: decreased Gait & Station: unable to stand  Patient leans: N/A  Psychiatric Specialty Exam: Physical ExamAs per history and physical    ROS complaining of accidental overdose of insulin and pain medication required hospitalization and intubation at [facility]. No Fever-chills, No Headache, No changes with Vision or hearing,  reports vertigo No problems swallowing food or Liquids, No Chest pain, Cough or Shortness of Breath, No Abdominal pain, No Nausea or Vommitting, Bowel movements are regular, No Blood in stool or Urine, No dysuria, No new skin rashes or bruises, No new joints pains-aches,  No new weakness, tingling, numbness in any extremity, No recent weight gain or loss, No polyuria, polydypsia or polyphagia,   A full 10 point Review of Systems was done, except as stated above, all other Review of Systems were negative.  Blood pressure 139/79, pulse 77, temperature 99.5 F (37.5 C), resp. rate 11, height 6' (1.829 m), weight (!) 139 kg (306 lb 7 oz), SpO2 95 %.Body mass index is 41.56 kg/m.  General Appearance: Casual  Eye Contact:  Good  Speech:  Clear and Coherent  Volume:  Decreased  Mood:  Depressed  Affect:  Constricted and Depressed  Thought Process:  Coherent and Goal Directed  Orientation:  Full (Time, Place, and Person)  Thought Content:  Logical  Suicidal Thoughts:  No  Homicidal Thoughts:  No  Memory:  Immediate;   Fair Recent;   Fair  Judgement:  Fair  Insight:  Fair  Psychomotor Activity:  Decreased  Concentration:  Concentration: Fair and Attention Span: Fair  Recall:  Good  Fund of Knowledge:  Good  Language:  Good  Akathisia:  Negative  Handed:  Right  AIMS (if indicated):     Assets:  Communication Skills Desire for Improvement Financial Resources/Insurance Housing Leisure Time Resilience Social Support Transportation  ADL's:  Impaired  Cognition:  WNL  Sleep:        Treatment Plan Summary: Patient has been suffering with multiple medical problems and recently admitted with the accidental overdose of insulin/pain medication and required intubation. Patient was transferred from [name] for additional treatment. Patient has been slowly improving from altered mental status and denied current suicidal/homicidal ideation and  psychosis.  Altered mental status: Patient has been slowly improving with the current medication  Safety: Patient has no safety concerns and contract for safety at this time Recommended no  psychotropic medication at this time Refer to the unit social service for collateral information   Daily contact with patient to assess and evaluate symptoms and progress in treatment and Medication management  Disposition: Patient does not meet criteria for psychiatric inpatient admission. Supportive therapy provided about ongoing stressors.  Ambrose Finland, MD 05/23/2016 12:57 PM

## 2016-05-23 NOTE — Progress Notes (Addendum)
Pt's wife Shiron Springer notified about pt's transfer to room 3S06.

## 2016-05-23 NOTE — Procedures (Signed)
Critical ABG results hand delivered to Dr. Vaughan Browner. 7.456/64.1/180/45.2. No changes ordered at this time.

## 2016-05-23 NOTE — Progress Notes (Signed)
PULMONARY / CRITICAL CARE MEDICINE   Name: Rick Cruz MRN: [id number] DOB: 1945/12/27    ADMISSION DATE:  05/17/2016 CONSULTATION DATE:  05/17/2016  REFERRING MD:  Dr Shanon Brow [name]  CHIEF COMPLAINT:  AMS  HISTORY OF PRESENT ILLNESS:   70 year old male with past medical history as below, which is significant for CHF, OSA on home O2 (he had a sleep study 2016 recommending bi-level 13/7 which he reportedly does not use), COPD, diabetes, schizophrenia, and blindness of the right eye. He presented to [facility] emergency department 8/31 with confusion. Symptoms started at home where she checked his blood sugar and found to be greater than 300. She called 911 and administered 30 units of insulin. Mental status was then worsened, and repeat glucose in the emergency department was 19. He was administered D50 and Narcan, however, he continued to deteriorate complaining of shortness of breath and was eventually intubated. Plan was to admit the patient for hypoxic/hypercarbic respiratory failure, however, daily requested transfer to [facility]. Prior to transfer he apparently improved and was extubated in the emergency department. He was placed on BiPAP.  Of note, he has been admitted several times with similar complaints in the past. He does have a history of altered mental status admissions for various reasons including polypharmacy specifically benzodiazepine overdose, hyponatremia, OSA/OHS decompensation, sepsis, and hypoglycemia.  SUBJECTIVE:  Extubated yesterday. Hypertensive overnight, started on dilt drip. Requiring partial rebreather overnight and this AM, could not tolerate CPAP. Reports feeling depressed prior to hospitalization. States his son died and thinks he took too much insulin. Denies taking any other medications or substances.   VITAL SIGNS: BP (!) 167/106   Pulse 87   Temp 99.5 F (37.5 C)   Resp 17   Ht 6' (1.829 m)   Wt (!) 306 lb 7 oz (139 kg)   SpO2 94%    BMI 41.56 kg/m   HEMODYNAMICS:    VENTILATOR SETTINGS: Vent Mode: PSV FiO2 (%):  [40 %] 40 % Set Rate:  [24 bmp] 24 bmp Vt Set:  [id number] mL] 620 mL PEEP:  [5 cmH20] 5 cmH20 Pressure Support:  [5 cmH20] 5 cmH20 Plateau Pressure:  [25 cmH20] 25 cmH20  INTAKE / OUTPUT: I/O last 3 completed shifts: In: 2722 [I.V.:565.8; Other:430; NG/GT:926.3; IV Piggyback:800] Out: 7380 [Urine:7380]  PHYSICAL EXAMINATION: General:  Morbidly obese man, sitting up in bed on partial rebreather mask, NAD Neuro: Alert and oriented x 3. Moves all four extremities.  HEENT: [place]/AT, EOMI, mucus membranes moist  Cardiovascular:  RRR, no m/g/r Lungs: CTA anteriorly, no wheezing or rhonchi appreciated  Abdomen: BS+, soft, obese, non-tender  Musculoskeletal:  No acute deformity Skin:  Grossly intact.   LABS:  BMET  Recent Labs Lab 05/20/16 0400 05/21/16 0405 05/22/16 0424  NA 135 139 137  K 4.3 3.9 4.6  CL 97* 96* 94*  CO2 35* 37* 37*  BUN 15 18 23*  CREATININE 0.86 0.86 0.90  GLUCOSE 276* 208* 271*    Electrolytes  Recent Labs Lab 05/20/16 0400 05/21/16 0405 05/22/16 0424  CALCIUM 7.8* 8.6* 9.2  MG 2.0 1.7 1.7  PHOS 2.2* 2.0* 2.6    CBC  Recent Labs Lab 05/20/16 0400 05/21/16 0405 05/22/16 0424  WBC 8.9 7.7 7.4  HGB 8.2* 8.4* 9.4*  HCT 28.4* 30.0* 32.6*  PLT 146* 152 161    Coag's  Recent Labs Lab 05/18/16 1200  APTT 22*  INR 1.21    Sepsis Markers  Recent Labs Lab  05/17/16 2018 05/18/16 1237  05/18/16 2015 05/19/16 0330 05/20/16 0400 05/21/16 0405  LATICACIDVEN 1.39 2.7*  --  1.4  --   --   --   PROCALCITON  --   --   < >  --  3.53 1.86 1.17  < > = values in this interval not displayed.  ABG  Recent Labs Lab 05/18/16 0527 05/23/16 0345 05/23/16 0411  PHART 7.377 7.388 7.456*  PCO2ART 66.7* 67.0* 64.1*  PO2ART 70.0* 49.0* 180.0*    Liver Enzymes  Recent Labs Lab 05/17/16 1748 05/18/16 1300 05/19/16 0330  AST 15  --  12*  ALT 9*  --  8*   ALKPHOS 64  --  46  BILITOT 0.2*  --  0.7  ALBUMIN 3.9 2.5* 2.4*    Cardiac Enzymes  Recent Labs Lab 05/17/16 1748 05/18/16 1200  TROPONINI <0.03 0.03*    Glucose  Recent Labs Lab 05/22/16 0740 05/22/16 1134 05/22/16 1523 05/22/16 1933 05/23/16 0006 05/23/16 0402  GLUCAP 287* 205* 156* 178* 160* 186*    Imaging No results found. STUDIES:   CULTURES: BCx2 9/1 > Urine 9/1 > neg Tracheal aspirate 9/1> Normal respiratory flora  ANTIBIOTICS: Cefepime 8/31 >>>9/5 Vancomycin 8/31 >>>9/5 Azithromycin 9/1>>>9/2 Ceftriaxone 9/5>> stop date 9/7  SIGNIFICANT EVENTS: 8/31 - Presented to OSH ED w/ altered mentation 9/01 - Transfer to [facility] at family request 9/5- extubated  LINES/TUBES: ETT 8/31, 9/1 >>>9/5  DISCUSSION: 70 year old male with chronic respiratory failure presented 8/31 with decreased LOC. Found to by hypercarbic/hypoxemic requiring intubation. Concern for PNA superimposed on OSA/OHS. Treat with broad spectrum ABX. Close glucose monitoring. Supportive care on vent.   ASSESSMENT / PLAN:  PULMONARY A: Acute on chronic mixed respiratory failure (extubated 9/5) OSA/OHS decompensation with central apnea/hypoventilation likely playing role with history of polypharmacy and opioids positive on UDS. No clinical response to narcan in ED. COPD without exacerbation Asp PNA from OD? P:   Keep O2 sats 88-94% Scheduled Duoneb Stop steroids, not required now Continue antibiotics as below Neg balance ABG reviewed cpap nocturnal Lasix   CARDIOVASCULAR A:  Chronic diastolic CHF by echo [id number], could be some acute component contributing to current illness H/o HLD Shock, r/o sirs, sepsis from asp vs OD HTN NEw fib P:  On Dilt drip, HR low, will dc, add oral Restart PO home meds plavix, metoprolol, lasix, zocor, asa  Ensure tsh Add anticoagulation   RENAL A:   Hypokalemia-resolved Hypoglycemia improved Gross neg balance obtained P:   KVO  IVF Replete lytes Monitor urine output Reduce lasix  GASTROINTESTINAL A:   dysphagia  P:   Start diet if able Pepcid BID for SUP  HEMATOLOGIC A:   Anemia, Hgb at baseline New fib P:  Follow CBC SQ heparin for VTE ppx - dc Add oral agent  INFECTIOUS A:   Worrisome for occult infection, likely source PNA - asp  P:   Ceftriaxone x 2 doses  ENDOCRINE A:   DM with insulin pump Hypoglycemia, now hyper P:   CBG monitoring and SSI Continue resistant scale Lantus 20 once eating / TF  NEUROLOGIC A:   Acute metabolic encephalopathy Schizophrenia 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 No NMS noted P:   Suicide precautions Sitter Needs psych eval Continue celexa Holding home gabapentin, trazadone, norco, geodon  FAMILY  - Updates: No family present. Attempted to contact wife via phone but unsuccessful. Left a  message to contact the ICU. - Inter-disciplinary family meet or Palliative Care meeting due by:  9/8  Albin Felling, MD, MPH Internal Medicine Resident, PGY-III Pager: [phone]  STAFF NOTE: Linwood Dibbles, MD FACP have personally reviewed patient's available data, including medical history, events of note, physical examination and test results as part of my evaluation. I have discussed with resident/NP and other care providers such as pharmacist, RN and RRT. In addition, I personally evaluated patient and elicited key findings of: awakens, follows commands, coarse BS resolving, new fib rvr, get ecg to document rhythm, get off card drip , oral load, add anticoagulation, chadv high - start anticoagulation, place ngt, failed swallow, feed, ABX x2 more days, reduce lasix, call psych, may have had insulin OD, may need c peptide, likley not helpful now The patient is critically ill with multiple organ systems failure and requires high complexity decision making for  assessment and support, frequent evaluation and titration of therapies, application of advanced monitoring technologies and extensive interpretation of multiple databases.   Critical Care Time devoted to patient care services described in this note is 30 Minutes. This time reflects time of care of this signee: Merrie Roof, MD FACP. This critical care time does not reflect procedure time, or teaching time or supervisory time of PA/NP/Med student/Med Resident etc but could involve care discussion time. Rest per NP/medical resident whose note is outlined above and that I agree with   Lavon Paganini. Titus Mould, MD, [place] Pgr: [facility] 05/23/2016 9:27 AM

## 2016-05-23 NOTE — Evaluation (Signed)
Clinical/Bedside Swallow Evaluation Patient Details  Name: Rick Cruz MRN: [id number] Date of Birth: 1946/02/19  Today's Date: 05/23/2016 Time: SLP Start Time (ACUTE ONLY): 0849 SLP Stop Time (ACUTE ONLY): 0903 SLP Time Calculation (min) (ACUTE ONLY): 14 min  Past Medical History:  Past Medical History:  Diagnosis Date  . 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 CHF (congestive heart failure) (HCC)    diastolic   . Chronic pain   . COPD (chronic obstructive pulmonary disease) ([place])   . Depression   . Diabetes mellitus without complication (HCC)    insulin pump  . Hypercholesterolemia   . On home O2    3 liters  . Schizophrenia ([place])   . Sleep apnea    noncompliant with BiPAP  . Tobacco abuse    Past Surgical History:  Past Surgical History:  Procedure Laterality Date  . COLON SURGERY  March 2010   secondary to large colon polyp, final path per discharge summary notes tubulovillous adenoma.   . COLONOSCOPY  July 2011   Dr. Benson [place]: multiple polyps, internal and external hemorrhoids, diverticulosis. tubular adenoma  . ESOPHAGOGASTRODUODENOSCOPY (EGD) WITH PROPOFOL N/A 04/20/2013   Procedure: ESOPHAGOGASTRODUODENOSCOPY (EGD) WITH PROPOFOL;  Surgeon: Daneil Dolin, MD;  Location: [facility];  Service: Endoscopy;  Laterality: N/A;  . KNEE SURGERY     X27   HPI:  70 year old male with past medical history as below, which is significant for CHF, OSA on home O2 (he had a sleep study 2016 recommending bi-level 13/7 which he reportedly does not use), COPD, diabetes, schizophrenia, and blindness of the right eye. He presented to [facility] emergency department 8/31 with confusion. Symptoms started at home where she checked his blood sugar and found to be greater than 300. She called 911 and administered 30 units of insulin. Mental status was then worsened, and repeat glucose in the emergency department was 19. He was administered D50 and Narcan, however, he continued to deteriorate  complaining of shortness of breath and was eventually intubated. Plan was to admit the patient for hypoxic/hypercarbic respiratory failure, however, daily requested transfer to [facility]. Prior to transfer he apparently improved and was extubated in the emergency department. Re-intubated 9/1- 9/5.   Assessment / Plan / Recommendation Clinical Impression  Pt referred for clinical assessment of swallow function following extubation. Pt was lethargic, but alerted with consistent verbal/tactile stim. Although no overt s/s aspiration noted with PO trials, pt demonstrated appearance of delayed pharyngeal initiation and multiple effortful swallows/bolus. Some of this behavior could be secondary to AMS as effortful swallows were present for all consistencies even those that do not typically require increased effort when weakness present. Anticipate that as pt clears, he will be able to tolerate PO WFL. Currently recommend, NPO except ice chips when pt fully alert. If any oral meds required, recommend crushed in puree. Will follow. Discussed POC with RN.     Aspiration Risk  Moderate aspiration risk    Diet Recommendation Ice chips PRN after oral care;NPO except meds   Medication Administration: Crushed with puree Supervision:  (Total assist with ice chips) Postural Changes: Seated upright at 90 degrees    Other  Recommendations Oral Care Recommendations: Oral care BID;Staff/trained caregiver to provide oral care   Follow up Recommendations  24 hour supervision/assistance    Frequency and Duration min 2x/week  2 weeks       Prognosis Prognosis for Safe Diet Advancement: Good Barriers to Reach Goals: Cognitive deficits;Behavior  Swallow Study   General Date of Onset: 05/17/16 HPI: 70 year old male with past medical history as below, which is significant for CHF, OSA on home O2 (he had a sleep study 2016 recommending bi-level 13/7 which he reportedly does not use), COPD, diabetes,  schizophrenia, and blindness of the right eye. He presented to [facility] emergency department 8/31 with confusion. Symptoms started at home where she checked his blood sugar and found to be greater than 300. She called 911 and administered 30 units of insulin. Mental status was then worsened, and repeat glucose in the emergency department was 19. He was administered D50 and Narcan, however, he continued to deteriorate complaining of shortness of breath and was eventually intubated. Plan was to admit the patient for hypoxic/hypercarbic respiratory failure, however, daily requested transfer to [facility]. Prior to transfer he apparently improved and was extubated in the emergency department. Re-intubated 9/1- 9/5. Type of Study: Bedside Swallow Evaluation Previous Swallow Assessment: none known Diet Prior to this Study: NPO Temperature Spikes Noted: No Respiratory Status: Nasal cannula (4L) History of Recent Intubation: Yes Length of Intubations (days): 6 days Date extubated: 05/22/16 Behavior/Cognition: Lethargic/Drowsy;Requires cueing Oral Cavity Assessment: Within Functional Limits Oral Care Completed by SLP: Recent completion by staff Oral Cavity - Dentition: Missing dentition;Poor condition Self-Feeding Abilities: Total assist (pt wearing mitts) Patient Positioning: Upright in bed Baseline Vocal Quality: Hoarse;Low vocal intensity Volitional Cough: Cognitively unable to elicit Volitional Swallow: Unable to elicit    Oral/Motor/Sensory Function Overall Oral Motor/Sensory Function: Within functional limits   Ice Chips Ice chips: Within functional limits Presentation: Spoon   Thin Liquid Thin Liquid: Impaired Presentation: Spoon;Cup Pharyngeal  Phase Impairments: Multiple swallows (effortful swallows)    Nectar Thick Nectar Thick Liquid: Not tested   Honey Thick Honey Thick Liquid: Not tested   Puree Puree: Impaired Presentation: Spoon Pharyngeal Phase Impairments: Multiple  swallows   Solid   GO   Solid: Not tested        Vinetta Bergamo MA, CCC-SLP Pager [phone] 05/23/2016,9:18 AM

## 2016-05-23 NOTE — Progress Notes (Signed)
[place] Progress Note Patient Name: Rick Cruz DOB: Oct 03, 1945 MRN: [id number]   Date of Service  05/23/2016  HPI/Events of Note  AFRVR Agitation  eICU Interventions  Change nicardipine to diltiazem Check ABG per RN request     Intervention Category Major Interventions: Arrhythmia - evaluation and management;Change in mental status - evaluation and management  Wilhelmina Mcardle 05/23/2016, 2:07 AM

## 2016-05-23 NOTE — Progress Notes (Signed)
Nutrition Follow-up  DOCUMENTATION CODES:   Morbid obesity  INTERVENTION:    When able to start TF, initiate Vital AF 1.2 via OGT at 25 ml/h, increase by 10 ml every 4 hours to goal rate of 75 ml/h (1800 ml per day) to provide 2160 kcals, 135 gm protein, 1460 ml free water daily.  NUTRITION DIAGNOSIS:   Inadequate oral intake related to dysphagia as evidenced by NPO status.  Ongoing  GOAL:   Patient will meet greater than or equal to 90% of their needs  Unmet  MONITOR:   Labs, Weight trends, TF tolerance, I & O's  REASON FOR ASSESSMENT:   Consult Enteral/tube feeding initiation and management  ASSESSMENT:   70 year old male with past medical history significant for CHF, OSA on home O2 (he had a sleep study 2016 recommending bi-level 13/7 which he reportedly does not use), COPD, diabetes, schizophrenia, and blindness of the right eye. Found to by hypercarbic/hypoxemic requiring intubation. Concern for PNA superimposed on OSA/OHS. Treat with broad spectrum ABX. Close glucose monitoring. Supportive care on vent.   Patient was extubated on 9/5. S/P swallow evaluation with SLP, remains NPO. Received MD Consult for TF initiation and management. Abdominal xray showed possible ileus; tip of NGT is in the stomach. Per RN, patient's abdomen is distended. Plans to hold off on TF for now.  Labs reviewed.  CBG (last 3)   Recent Labs  05/23/16 0402 05/23/16 0750 05/23/16 1117  GLUCAP 186* 183* 169*   Medications reviewed and include Lasix, Novolog, Lantus.  Diet Order:   NPO  Skin:  Reviewed, no issues  Last BM:  PTA  Height:   Ht Readings from Last 1 Encounters:  05/18/16 6' (1.829 m)    Weight:   Wt Readings from Last 1 Encounters:  05/23/16 (!) 306 lb 7 oz (139 kg)    Ideal Body Weight:  80.91 kg  BMI:  Body mass index is 41.56 kg/m.  Estimated Nutritional Needs:   Kcal:  2200-2400  Protein:  130-140 gm  Fluid:  2.2-2.4 L  EDUCATION NEEDS:    No education needs identified at this time  Molli Barrows, [place], [place], [place] Pager [phone] After Hours Pager [phone]

## 2016-05-24 ENCOUNTER — Other Ambulatory Visit (HOSPITAL_COMMUNITY): Payer: Self-pay

## 2016-05-24 DIAGNOSIS — J9602 Acute respiratory failure with hypercapnia: Secondary | ICD-10-CM | POA: Diagnosis not present

## 2016-05-24 DIAGNOSIS — E118 Type 2 diabetes mellitus with unspecified complications: Secondary | ICD-10-CM

## 2016-05-24 DIAGNOSIS — E662 Morbid (severe) obesity with alveolar hypoventilation: Secondary | ICD-10-CM | POA: Diagnosis not present

## 2016-05-24 DIAGNOSIS — J449 Chronic obstructive pulmonary disease, unspecified: Secondary | ICD-10-CM

## 2016-05-24 DIAGNOSIS — J9611 Chronic respiratory failure with hypoxia: Secondary | ICD-10-CM | POA: Diagnosis not present

## 2016-05-24 DIAGNOSIS — Z794 Long term (current) use of insulin: Secondary | ICD-10-CM

## 2016-05-24 DIAGNOSIS — G9340 Encephalopathy, unspecified: Secondary | ICD-10-CM | POA: Diagnosis not present

## 2016-05-24 DIAGNOSIS — J9601 Acute respiratory failure with hypoxia: Secondary | ICD-10-CM | POA: Diagnosis not present

## 2016-05-24 LAB — GLUCOSE, CAPILLARY
GLUCOSE-CAPILLARY: 135 mg/dL — AB (ref 65–99)
GLUCOSE-CAPILLARY: 146 mg/dL — AB (ref 65–99)
GLUCOSE-CAPILLARY: 150 mg/dL — AB (ref 65–99)
GLUCOSE-CAPILLARY: 166 mg/dL — AB (ref 65–99)
GLUCOSE-CAPILLARY: 178 mg/dL — AB (ref 65–99)
GLUCOSE-CAPILLARY: 218 mg/dL — AB (ref 65–99)
Glucose-Capillary: 176 mg/dL — ABNORMAL HIGH (ref 65–99)
Glucose-Capillary: 172 mg/dL — ABNORMAL HIGH (ref 65–99)

## 2016-05-24 LAB — BASIC METABOLIC PANEL
ANION GAP: 10 (ref 5–15)
BUN: 19 mg/dL (ref 6–20)
CALCIUM: 9.2 mg/dL (ref 8.9–10.3)
CHLORIDE: 93 mmol/L — AB (ref 101–111)
CO2: 39 mmol/L — AB (ref 22–32)
Creatinine, Ser: 0.89 mg/dL (ref 0.61–1.24)
GFR calc Af Amer: >60 mL/min (ref >60)
GFR calc non Af Amer: >60 mL/min (ref >60)
GLUCOSE: 174 mg/dL — AB (ref 65–99)
Potassium: 3.6 mmol/L (ref 3.5–5.1)
Sodium: 142 mmol/L (ref 135–145)

## 2016-05-24 LAB — PHOSPHORUS: PHOSPHORUS: 3.4 mg/dL (ref 2.5–4.6)

## 2016-05-24 LAB — MAGNESIUM: Magnesium: 2 mg/dL (ref 1.7–2.4)

## 2016-05-24 LAB — TSH: TSH: 1.333 u[IU]/mL (ref 0.350–4.500)

## 2016-05-24 MED ORDER — ALBUTEROL SULFATE (2.5 MG/3ML) 0.083% IN NEBU: 2.5000 mg | INHALATION_SOLUTION | RESPIRATORY_TRACT | Status: DC | PRN

## 2016-05-24 MED ORDER — HYDRALAZINE HCL 20 MG/ML IJ SOLN: 10.0000 mg | Freq: Four times a day (QID) | INTRAMUSCULAR | Status: DC | PRN

## 2016-05-24 MED ORDER — METOPROLOL TARTRATE 25 MG PO TABS
25.0000 mg | ORAL_TABLET | Freq: Two times a day (BID) | ORAL | Status: DC
  Administered 2016-05-24 – 2016-05-26 (×5): 25 mg via ORAL
  Filled 2016-05-24 (×5): qty 1

## 2016-05-24 MED ORDER — IPRATROPIUM-ALBUTEROL 0.5-2.5 (3) MG/3ML IN SOLN
3.0000 mL | Freq: Four times a day (QID) | RESPIRATORY_TRACT | Status: DC
  Administered 2016-05-24 – 2016-05-29 (×17): 3 mL via RESPIRATORY_TRACT
  Filled 2016-05-24 (×16): qty 3

## 2016-05-24 MED ORDER — OXYCODONE HCL 5 MG PO TABS
5.0000 mg | ORAL_TABLET | Freq: Four times a day (QID) | ORAL | Status: DC | PRN
  Administered 2016-05-24 – 2016-05-25 (×2): 5 mg via ORAL
  Filled 2016-05-24 (×2): qty 1

## 2016-05-24 NOTE — Progress Notes (Signed)
[facility] PROGRESS NOTE  Rick Cruz TGG:[id number] DOB: 15-Dec-1945 DOA: 05/17/2016  PCP: [name], MD  Brief HPI: 70 year old male with past medical history which is significant for CHF, OSA on home O2 (he had a sleep study 2016 recommending bi-level 13/7 which he reportedly does not use), COPD, diabetes, schizophrenia, and blindness of the right eye. He presented to [facility] emergency department 8/31 with confusion. Symptoms started at home where she checked his blood sugar and found to be greater than 300. She called 911 and administered 30 units of insulin. Mental status was then worsened, and repeat glucose in the emergency department was 19. He was administered D50 and Narcan, however, he continued to deteriorate complaining of shortness of breath and was eventually intubated. Of note, he has been admitted several times with similar complaints in the past. He does have a history of altered mental status admissions for various reasons including polypharmacy specifically benzodiazepine overdose, hyponatremia, OSA/OHS decompensation, sepsis, and hypoglycemia. Patient was admitted to the intensive care unit. He was stabilized. He was subsequently transitioned to the stepdown unit.  Past medical history:  Past Medical History:  Diagnosis Date  . 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 CHF (congestive heart failure) (HCC)    diastolic   . Chronic pain   . COPD (chronic obstructive pulmonary disease) ([place])   . Depression   . Diabetes mellitus without complication (HCC)    insulin pump  . Hypercholesterolemia   . On home O2    3 liters  . Schizophrenia ([place])   . Sleep apnea    noncompliant with BiPAP  . Tobacco abuse     Consultants: Psychiatry.  CULTURES: BCx2 9/1 > Urine 9/1 > neg Tracheal aspirate 9/1> Normal respiratory flora  ANTIBIOTICS: Cefepime 8/31 >>>9/5 Vancomycin 8/31 >>>9/5 Azithromycin 9/1>>>9/2 Ceftriaxone 9/5>> stop date 9/7   SIGNIFICANT  EVENTS: 8/31 - Presented to [facility] ED w/ altered mentation 9/01 - Transfer to [facility] at family request 9/5- extubated  LINES/TUBES: ETT 8/31, 9/1 >>>9/5  Subjective: Patient denies any complaints this morning except for his usual chronic back and neck pain. He is wondering when he can go home. He denies any chest pain, shortness of breath. Denies any nausea or vomiting.  Objective:  Vital Signs  Vitals:   05/24/16 0343 05/24/16 0723 05/24/16 0733 05/24/16 0918  BP: (!) 152/97 (!) 172/94 (!) 169/90   Pulse: 91 96 84   Resp: 16 19 19    Temp: 98.8 F (37.1 C) 98.6 F (37 C)    TempSrc: Oral Oral    SpO2: 92% 91% 90% 91%  Weight:      Height:        Intake/Output Summary (Last 24 hours) at 05/24/16 1004 Last data filed at 05/24/16 0734  Gross per 24 hour  Intake              340 ml  Output             2285 ml  Net            -1945 ml   Filed Weights   05/21/16 0400 05/22/16 0415 05/23/16 0113  Weight: (!) 146.8 kg (323 lb 10.2 oz) (!) 145.6 kg (320 lb 15.8 oz) (!) 139 kg (306 lb 7 oz)    General appearance: alert, cooperative, distracted and no distress Resp: Few crackles at the bases. No wheezing. No rhonchi. Diminished air entry bilaterally. Cardio: S1, S2 is irregularly irregular. No  S3, S4. No rubs, murmurs, or bruit. No pedal edema. GI: soft, non-tender; bowel sounds normal; no masses,  no organomegaly Extremities: extremities normal, atraumatic, no cyanosis or edema Neurologic: Awake, distracted. No obvious focal neurological deficits. Psych: Normal affect. Somewhat distracted.  Lab Results:  Data Reviewed: I have personally reviewed following labs and imaging studies  CBC:  Recent Labs Lab 05/17/16 1748 05/18/16 1200 05/19/16 0330 05/20/16 0400 05/21/16 0405 05/22/16 0424 05/23/16 0730  WBC 18.5* 27.0* 14.2* 8.9 7.7 7.4 13.6*  NEUTROABS 16.3* 24.2* 12.2*  --   --   --   --   HGB 11.9* 9.5* 8.9* 8.2* 8.4* 9.4* 10.0*  HCT 42.5 34.2* 31.7* 28.4* 30.0*  32.6* 35.4*  MCV 96.2 94.7 92.7 91.3 92.3 90.8 92.4  PLT 209 179 149* 146* 152 161 341   Basic Metabolic Panel:  Recent Labs Lab 05/20/16 0400 05/21/16 0405 05/22/16 0424 05/23/16 0730 05/23/16 1147 05/23/16 1700 05/24/16 0500  NA 135 139 137 142  --   --  142  K 4.3 3.9 4.6 4.2  --   --  3.6  CL 97* 96* 94* 92*  --   --  93*  CO2 35* 37* 37* 38*  --   --  39*  GLUCOSE 276* 208* 271* 183*  --   --  174*  BUN 15 18 23* 22*  --   --  19  CREATININE 0.86 0.86 0.90 0.99  --   --  0.89  CALCIUM 7.8* 8.6* 9.2 9.5  --   --  9.2  MG 2.0 1.7 1.7  --  1.9 1.9 2.0  PHOS 2.2* 2.0* 2.6  --  3.5 3.8 3.4   GFR: Estimated Creatinine Clearance: 113.2 mL/min (by C-G formula based on SCr of 0.89 mg/dL). Liver Function Tests:  Recent Labs Lab 05/17/16 1748 05/18/16 1300 05/19/16 0330  AST 15  --  12*  ALT 9*  --  8*  ALKPHOS 64  --  46  BILITOT 0.2*  --  0.7  PROT 6.9  --  4.8*  ALBUMIN 3.9 2.5* 2.4*   Coagulation Profile:  Recent Labs Lab 05/18/16 1200  INR 1.21   Cardiac Enzymes:  Recent Labs Lab 05/17/16 1748 05/18/16 1200 05/19/16 0330  CKTOTAL  --  51 123  TROPONINI <0.03 0.03*  --    CBG:  Recent Labs Lab 05/23/16 1539 05/23/16 1949 05/24/16 0008 05/24/16 0351 05/24/16 0750  GLUCAP 174* 171* 135* 178* 166*   Thyroid Function Tests:  Recent Labs  05/24/16 0501  TSH 1.333   Urine analysis:    Component Value Date/Time   COLORURINE YELLOW 05/17/2016 [place] 05/17/2016 1727   LABSPEC 1.025 05/17/2016 1727   PHURINE 5.0 05/17/2016 1727   GLUCOSEU NEGATIVE 05/17/2016 1727   HGBUR NEGATIVE 05/17/2016 1727   BILIRUBINUR NEGATIVE 05/17/2016 1727   KETONESUR NEGATIVE 05/17/2016 1727   PROTEINUR NEGATIVE 05/17/2016 1727   UROBILINOGEN 1.0 12/05/2014 1519   NITRITE NEGATIVE 05/17/2016 1727   LEUKOCYTESUR NEGATIVE 05/17/2016 1727    Recent Results (from the past 240 hour(s))  Culture, respiratory (NON-Expectorated)     Status: None    Collection Time: 05/18/16  2:06 AM  Result Value Ref Range Status   Specimen Description TRACHEAL ASPIRATE  Final   Special Requests Normal  Final   Gram Stain   Final    MODERATE WBC PRESENT,BOTH PMN AND MONONUCLEAR GRAM POSITIVE COCCI IN PAIRS IN CHAINS    Culture Consistent with normal  respiratory flora.  Final   Report Status 05/20/2016 FINAL  Final  Culture, Urine     Status: None   Collection Time: 05/18/16  2:06 AM  Result Value Ref Range Status   Specimen Description URINE, CATHETERIZED  Final   Special Requests Normal  Final   Culture NO GROWTH  Final   Report Status 05/19/2016 FINAL  Final  MRSA PCR Screening     Status: None   Collection Time: 05/18/16  2:06 AM  Result Value Ref Range Status   MRSA by PCR NEGATIVE NEGATIVE Final    Comment:        The GeneXpert MRSA Assay (FDA approved for NASAL specimens only), is one component of a comprehensive MRSA colonization surveillance program. It is not intended to diagnose MRSA infection nor to guide or monitor treatment for MRSA infections.   Culture, blood (routine x 2)     Status: None   Collection Time: 05/18/16  3:45 AM  Result Value Ref Range Status   Specimen Description BLOOD LEFT HAND  Final   Special Requests AEROBIC BOTTLE ONLY 5ML  Final   Culture NO GROWTH 5 DAYS  Final   Report Status 05/23/2016 FINAL  Final  Culture, blood (routine x 2)     Status: None   Collection Time: 05/18/16  4:13 AM  Result Value Ref Range Status   Specimen Description BLOOD RIGHT HAND  Final   Special Requests IN PEDIATRIC BOTTLE 3ML  Final   Culture NO GROWTH 5 DAYS  Final   Report Status 05/23/2016 FINAL  Final      Radiology Studies: Dg Abd Portable 1v  Result Date: 05/23/2016 CLINICAL DATA:  Nasogastric tube placement.  Abdominal distention EXAM: PORTABLE ABDOMEN - 1 VIEW COMPARISON:  May 18, 2016 FINDINGS: Nasogastric tube tip and side port are in the stomach. There is generalized bowel dilatation. No free  air evident. IMPRESSION: Nasogastric tube tip and side port in stomach. Diffuse bowel dilatation. Suspect ileus, although a degree of bowel obstruction cannot be excluded. No free air evident. Electronically Signed   By: Lowella Grip III M.D.   On: 05/23/2016 11:21     Medications:  Scheduled: . apixaban  5 mg Oral BID  . chlorhexidine  15 mL Mouth Rinse BID  . citalopram  20 mg Oral Daily  . cloNIDine  0.1 mg Transdermal Weekly  . diltiazem  60 mg Oral Q6H  . famotidine (PEPCID) IV  20 mg Intravenous Q12H  . feeding supplement (PRO-STAT SUGAR FREE 64)  30 mL Per Tube BID  . feeding supplement (VITAL HIGH PROTEIN)  1,000 mL Per Tube Q24H  . furosemide  20 mg Intravenous Daily  . insulin aspart  0-20 Units Subcutaneous Q4H  . insulin glargine  20 Units Subcutaneous Daily  . ipratropium-albuterol  3 mL Nebulization Q4H  . mouth rinse  15 mL Mouth Rinse QID   Continuous: . diltiazem (CARDIZEM) infusion Stopped (05/23/16 1330)   IWL:NLGXQJ chloride, acetaminophen, LORazepam, ondansetron (ZOFRAN) IV  Assessment/Plan:  Principal Problem:   Acute respiratory failure with hypoxia and hypercarbia (HCC) Active Problems:   Paranoid schizophrenia ([place])   CAD (coronary artery disease)   DM (diabetes mellitus) ([place])   Chronic respiratory failure 02 dep with hypercarbia   Morbid obesity (HCC)   Acute encephalopathy   COPD (chronic obstructive pulmonary disease) (HCC)   Obesity hypoventilation syndrome (HCC)   Non compliance with medical treatment   Hypoglycemia   Hypercapnemia    Acute on chronic  mixed respiratory failure status post intubation and then extubation. Patient was in the intensive care unit. He was intubated and on mechanical ventilation. Patient's presentation was thought to be due to a combination of issues including medication effect, polypharmacy, hypoglycemia, obstructive sleep apnea/obesity hypoventilation syndrome. However, there was no clinical response to  Narcan. Patient is stable currently. There was some concern for pneumonia for which he is completing a course of antibiotics. Titrate oxygen to maintain saturations around 90%.  Pneumonia, possible aspiration with sepsis Continuing course of antibiotics. He's completed course of steroids as well. Sepsis has resolved.  New onset atrial fibrillation. Patient found to be in atrial fibrillation in the ICU. He was on a diltiazem drip, which has been discontinued. He is now on oral Cardizem. Chads 2 vascular score is at least 3. He will benefit from anticoagulation. Initiated on Eliquis.  History of chronic diastolic congestive heart failure Currently stable and appears to be reasonably well compensated.  Insulin-dependent diabetes with initial hypoglycemia Patient was on an insulin pump at home. There is some concern for perhaps an unintentional overdose with insulin. Monitor CBGs closely. He is on Lantus here, which will be continued. HbA1c is 7.4.  Essential hypertension. Monitor blood pressures closely. Blood pressure is running high. He is on multiple antihypertensives. Consider resuming his metoprolol. Hydralazine as needed.  Electrolyte abnormalities with hypokalemia. Now resolved.  Dysphagia. Speech therapy is following closely. He has an NG tube currently and is getting feeding through this. Hopefully he'll be able to take by mouth soon.  Acute metabolic encephalopathy in the setting of known schizophrenia with possible drug overdose. Patient currently is on suicide precautions. Psychiatry evaluation is pending. Continue Celexa. Holding his home medications including gabapentin, trazodone and Geodon.  History of COPD See above. Stable currently. Continue nebulizer treatments. Currently off of his antibiotics.  Obstructive sleep apnea CPAP every night.  DVT Prophylaxis: He is on Eliquis    Code Status: Full code  Family Communication: No family at bedside  Disposition Plan:  Continue stepdown for today. Hopefully diet can be advanced today. Anticipate transfer to floor tomorrow. Initiate physical therapy.    LOS: 7 days   [place] [facility] Pager [phone] 05/24/2016, 10:04 AM  If 7PM-7AM, please contact night-coverage at [email or web address], password [facility]

## 2016-05-24 NOTE — Care Management Important Message (Signed)
Important Message  Patient Details  Name: Rick Cruz MRN: [id number] Date of Birth: 11-23-1945   Medicare Important Message Given:  Yes    Nathen May 05/24/2016, 10:33 AM

## 2016-05-24 NOTE — Care Management Note (Addendum)
Case Management Note  Patient Details  Name: Rick Cruz MRN: [id number] Date of Birth: 28-Apr-1946  Subjective/Objective:  Patient transferred from 52M, on sucide precaution, bs of 19, ng tube to lw suction, he failed speech eval , they plan to see patient again, he is w/chair bound, on 4 liter [place], blind in r eye, very anxious, on home oxygn,await pt/ot eval.      9/8 Patient states his wife is there with him 24 hrs, NCM explained to patient that if he does not have someone with him 24 hrs that physical therapy is rec snf for him, he states he does not want to go to a facility.  He states he has a PCP, he has a rolling walker and a cane at home and he has oxygen with [facility] 3 liters. He states he would like to [facility] with [facility] for HHPT/HHOT and 3 n 1, informed patient he will have co pay for 3 n 1 with insurance.  Referral made to [facility] with [facility]  For HHPT/HHOT. NCM will cont to follow for dc needs.                 Action/Plan:   Expected Discharge Date:                  Expected Discharge Plan:  [place]  In-House Referral:     Discharge planning Services  CM Consult  Post Acute Care Choice:    Choice offered to:     DME Arranged:    DME Agency:     HH Arranged:    [place] Agency:     Status of Service:  In process, will continue to follow  If discussed at Long Length of Stay Meetings, dates discussed:    Additional Comments:  Zenon Mayo, RN 05/24/2016, 5:45 PM

## 2016-05-24 NOTE — Progress Notes (Signed)
Pt did not tolerate CPAP due to mask discomfort and agitation.

## 2016-05-24 NOTE — Progress Notes (Signed)
Speech Language Pathology Treatment: Dysphagia  Patient Details Name: Rick Cruz MRN: [id number] DOB: 04-15-1946 Today's Date: 05/24/2016 Time: 9390-3009 SLP Time Calculation (min) (ACUTE ONLY): 14 min  Assessment / Plan / Recommendation Clinical Impression  Pt seen for swallowing followup. Pt alert and able to participate, but required moderate verbal/tactile cueing for appropriate self-feeding. Pt does continue to demonstrate atypical behavior with swallowing- multiple, effortful swallows, however no overt s/s aspiration noted with any consistency. Previous RN indicated that behaviors might be baseline for pt based on meds. Pt did require increased time for mastication and benefited from a liquid wash following solid bolus for complete oral clearance. Recommend: Initiate Dys 2 diet with thin liquids. Meds whole with puree. 1:1 assist with meals to enforce small bites/sips, slow rate of intake and upright positioning to decrease risk of aspiration.   HPI HPI: 70 year old male with past medical history as below, which is significant for CHF, OSA on home O2 (he had a sleep study 2016 recommending bi-level 13/7 which he reportedly does not use), COPD, diabetes, schizophrenia, and blindness of the right eye. He presented to [facility] emergency department 8/31 with confusion. Symptoms started at home where she checked his blood sugar and found to be greater than 300. She called 911 and administered 30 units of insulin. Mental status was then worsened, and repeat glucose in the emergency department was 19. He was administered D50 and Narcan, however, he continued to deteriorate complaining of shortness of breath and was eventually intubated. Plan was to admit the patient for hypoxic/hypercarbic respiratory failure, however, daily requested transfer to [facility]. Prior to transfer he apparently improved and was extubated in the emergency department. Re-intubated 9/1- 9/5.      SLP Plan         Recommendations  Diet recommendations: Dysphagia 2 (fine chop);Thin liquid Liquids provided via: Cup;Straw Medication Administration: Whole meds with puree Supervision: Full supervision/cueing for compensatory strategies;Staff to assist with self feeding Compensations: Slow rate;Small sips/bites;Minimize environmental distractions Postural Changes and/or Swallow Maneuvers: Seated upright 90 degrees;Upright 30-60 min after meal             Oral Care Recommendations: Oral care BID;Staff/trained caregiver to provide oral care Follow up Recommendations: 24 hour supervision/assistance (possible SNF?)     GO                Vinetta Bergamo MA, [place] Pager [phone] 05/24/2016, 11:14 AM

## 2016-05-25 ENCOUNTER — Inpatient Hospital Stay (HOSPITAL_COMMUNITY): Payer: Self-pay

## 2016-05-25 ENCOUNTER — Inpatient Hospital Stay (HOSPITAL_COMMUNITY): Payer: PPO

## 2016-05-25 DIAGNOSIS — G9340 Encephalopathy, unspecified: Secondary | ICD-10-CM | POA: Diagnosis not present

## 2016-05-25 DIAGNOSIS — J9602 Acute respiratory failure with hypercapnia: Secondary | ICD-10-CM | POA: Diagnosis not present

## 2016-05-25 DIAGNOSIS — E876 Hypokalemia: Secondary | ICD-10-CM

## 2016-05-25 DIAGNOSIS — J9601 Acute respiratory failure with hypoxia: Secondary | ICD-10-CM | POA: Diagnosis not present

## 2016-05-25 DIAGNOSIS — R4182 Altered mental status, unspecified: Secondary | ICD-10-CM | POA: Diagnosis not present

## 2016-05-25 DIAGNOSIS — E662 Morbid (severe) obesity with alveolar hypoventilation: Secondary | ICD-10-CM | POA: Diagnosis not present

## 2016-05-25 DIAGNOSIS — E118 Type 2 diabetes mellitus with unspecified complications: Secondary | ICD-10-CM | POA: Diagnosis not present

## 2016-05-25 DIAGNOSIS — J449 Chronic obstructive pulmonary disease, unspecified: Secondary | ICD-10-CM | POA: Diagnosis not present

## 2016-05-25 DIAGNOSIS — J9611 Chronic respiratory failure with hypoxia: Secondary | ICD-10-CM | POA: Diagnosis not present

## 2016-05-25 LAB — GLUCOSE, CAPILLARY
GLUCOSE-CAPILLARY: 101 mg/dL — AB (ref 65–99)
GLUCOSE-CAPILLARY: 202 mg/dL — AB (ref 65–99)
Glucose-Capillary: 121 mg/dL — ABNORMAL HIGH (ref 65–99)
Glucose-Capillary: 146 mg/dL — ABNORMAL HIGH (ref 65–99)
Glucose-Capillary: 124 mg/dL — ABNORMAL HIGH (ref 65–99)
Glucose-Capillary: 76 mg/dL (ref 65–99)

## 2016-05-25 LAB — BLOOD GAS, ARTERIAL
ACID-BASE EXCESS: 12.4 mmol/L — AB (ref 0.0–2.0)
Bicarbonate: 37.2 mmol/L — ABNORMAL HIGH (ref 20.0–28.0)
Drawn by: 330991
O2 CONTENT: 4 L/min
O2 SAT: 90.4 %
PCO2 ART: 54.9 mmHg — AB (ref 32.0–48.0)
PO2 ART: 62.7 mmHg — AB (ref 83.0–108.0)
Patient temperature: 98.6
pH, Arterial: 7.446 (ref 7.350–7.450)

## 2016-05-25 LAB — CBC
HCT: 36.9 % — ABNORMAL LOW (ref 39.0–52.0)
HEMOGLOBIN: 10.6 g/dL — AB (ref 13.0–17.0)
MCH: 25.9 pg — AB (ref 26.0–34.0)
MCHC: 28.7 g/dL — ABNORMAL LOW (ref 30.0–36.0)
MCV: 90.2 fL (ref 78.0–100.0)
Platelets: 257 10*3/uL (ref 150–400)
RBC: 4.09 MIL/uL — AB (ref 4.22–5.81)
RDW: 13.7 % (ref 11.5–15.5)
WBC: 9.4 10*3/uL (ref 4.0–10.5)

## 2016-05-25 LAB — BASIC METABOLIC PANEL
ANION GAP: 8 (ref 5–15)
BUN: 13 mg/dL (ref 6–20)
CALCIUM: 8.8 mg/dL — AB (ref 8.9–10.3)
CHLORIDE: 90 mmol/L — AB (ref 101–111)
CO2: 37 mmol/L — ABNORMAL HIGH (ref 22–32)
CREATININE: 0.84 mg/dL (ref 0.61–1.24)
GFR calc non Af Amer: >60 mL/min (ref >60)
Glucose, Bld: 93 mg/dL (ref 65–99)
Potassium: 3.4 mmol/L — ABNORMAL LOW (ref 3.5–5.1)
SODIUM: 135 mmol/L (ref 135–145)

## 2016-05-25 MED ORDER — POTASSIUM CHLORIDE 10 MEQ/100ML IV SOLN
INTRAVENOUS | Status: AC
  Administered 2016-05-25: 10 meq via INTRAVENOUS
  Filled 2016-05-25: qty 100

## 2016-05-25 MED ORDER — FAMOTIDINE 20 MG PO TABS
20.0000 mg | ORAL_TABLET | Freq: Two times a day (BID) | ORAL | Status: DC
  Administered 2016-05-25 – 2016-05-29 (×8): 20 mg via ORAL
  Filled 2016-05-25 (×8): qty 1

## 2016-05-25 MED ORDER — POTASSIUM CHLORIDE 10 MEQ/100ML IV SOLN
10.0000 meq | INTRAVENOUS | Status: AC
  Administered 2016-05-25 (×4): 10 meq via INTRAVENOUS
  Filled 2016-05-25 (×3): qty 100

## 2016-05-25 NOTE — Evaluation (Signed)
Physical Therapy Evaluation Patient Details Name: Rick Cruz MRN: [id number] DOB: [birth year] Today's Date: 05/25/2016   History of Present Illness  70 years old male admitted to [facility] with increased symptoms of confusion/altered mental status. Patient reportedly given overdose of insulin by his son and reportedly he was taken excessive pain medication. Currently on suicide precautions. Patient reportedly has known mental illness including depression, anxiety, bipolar mania, psychosis.. On home O2.  Clinical Impression  Patient demonstrates deficits in functional mobility as indicated below. Will need continued skilled PT to address deficits. PTA, pt lived at home with wife and ambulated with RW short distance on home O2. Wife assisted as needed with ADL. Pt currently limited by fatigue. Requires min A with mobility.  OF NOTE: Pt with desat to low 80s on 4L with ambulation to bathroom. Unsure of accuracy of waveform. Pt felt light headed and and was unable to ambulate back to bed.  At this time, if pt continues to progress he would be appropriate to D/C home with HHPT, otherwise may need ST SNF    Follow Up Recommendations Home health PT;Supervision/Assistance - 24 hour (if does not progress or have adequate assist will need SNF)    Equipment Recommendations  Other (comment) (TBD)    Recommendations for Other Services       Precautions / Restrictions Precautions Precautions: Other (comment);Fall (suicide) Restrictions Weight Bearing Restrictions: No      Mobility  Bed Mobility Overal bed mobility: Needs Assistance Bed Mobility: Supine to Sit     Supine to sit: Supervision;HOB elevated        Transfers Overall transfer level: Needs assistance Equipment used: Rolling walker (2 wheeled) Transfers: Sit to/from Stand Sit to Stand: Min guard;+2 safety/equipment         General transfer comment: poor safety awareness  Ambulation/Gait Ambulation/Gait  assistance: Min assist;+2 physical assistance Ambulation Distance (Feet): 20 Feet Assistive device: Rolling walker (2 wheeled) Gait Pattern/deviations: Step-to pattern;Decreased stride length;Drifts right/left;Trunk flexed;Wide base of support Gait velocity: decreased Gait velocity interpretation: Below normal speed for age/gender General Gait Details: slow gait, some instability with flexed posutre. ambulated on 4 liters.  Stairs            Wheelchair Mobility    Modified Rankin (Stroke Patients Only)       Balance Overall balance assessment: Needs assistance   Sitting balance-Leahy Scale: Good       Standing balance-Leahy Scale: Fair                               Pertinent Vitals/Pain Pain Assessment: Faces Faces Pain Scale: Hurts a little bit Pain Location: back Pain Descriptors / Indicators: Discomfort;Grimacing Pain Intervention(s): Limited activity within patient's tolerance    Home Living Family/patient expects to be discharged to:: Private residence Living Arrangements: Spouse/significant other Available Help at Discharge: Family;Available 24 hours/day Type of Home: House Home Access: Stairs to enter Entrance Stairs-Rails: Right Entrance Stairs-Number of Steps: 4   Home Equipment: Walker - standard;Grab bars - tub/shower      Prior Function Level of Independence: Needs assistance   Gait / Transfers Assistance Needed: used standard walker for short distances  ADL's / Homemaking Assistance Needed: wife helped with bathing back and with feet/socks/shoes        Hand Dominance   Dominant Hand: Right    Extremity/Trunk Assessment   Upper Extremity Assessment: Generalized weakness  Lower Extremity Assessment: Generalized weakness      Cervical / Trunk Assessment: Normal  Communication   Communication: No difficulties  Cognition Arousal/Alertness: Lethargic Behavior During Therapy: Flat affect Overall Cognitive  Status: No family/caregiver present to determine baseline cognitive functioning                      General Comments      Exercises        Assessment/Plan    PT Assessment Patient needs continued PT services  PT Diagnosis Difficulty walking;Abnormality of gait;Generalized weakness;Altered mental status   PT Problem List Decreased strength;Decreased activity tolerance;Decreased balance;Decreased mobility;Decreased safety awareness;Cardiopulmonary status limiting activity;Obesity  PT Treatment Interventions DME instruction;Gait training;Stair training;Functional mobility training;Therapeutic activities;Therapeutic exercise;Balance training;Patient/family education   PT Goals (Current goals can be found in the Care Plan section) Acute Rehab PT Goals Patient Stated Goal: none stated PT Goal Formulation: Patient unable to participate in goal setting Time For Goal Achievement: 06/08/16 Potential to Achieve Goals: Fair    Frequency Min 3X/week   Barriers to discharge        Co-evaluation PT/OT/SLP Co-Evaluation/Treatment: Yes Reason for Co-Treatment: Complexity of the patient's impairments (multi-system involvement);Necessary to address cognition/behavior during functional activity;For patient/therapist safety PT goals addressed during session: Mobility/safety with mobility         End of Session Equipment Utilized During Treatment: Gait belt;Oxygen Activity Tolerance: Patient limited by fatigue Patient left: in chair;with call bell/phone within reach;with nursing/sitter in room Nurse Communication: Mobility status         Time: 3704-8889 PT Time Calculation (min) (ACUTE ONLY): 29 min   Charges:   PT Evaluation $PT Eval Moderate Complexity: 1 Procedure     PT G CodesDuncan Dull 2016/06/07, 11:47 AM Alben Deeds, PT DPT  [phone]

## 2016-05-25 NOTE — Progress Notes (Signed)
[facility] PROGRESS NOTE  Rick POPELKA IEP:[id number] DOB: 1946/04/30 DOA: 05/17/2016  PCP: [name], MD  Brief HPI: 70 year old male with past medical history which is significant for CHF, OSA on home O2 (he had a sleep study 2016 recommending bi-level 13/7 which he reportedly does not use), COPD, diabetes, schizophrenia, and blindness of the right eye. He presented to [facility] emergency department 8/31 with confusion. Symptoms started at home where she checked his blood sugar and found to be greater than 300. She called 911 and administered 30 units of insulin. Mental status was then worsened, and repeat glucose in the emergency department was 19. He was administered D50 and Narcan, however, he continued to deteriorate complaining of shortness of breath and was eventually intubated. Of note, he has been admitted several times with similar complaints in the past. He does have a history of altered mental status admissions for various reasons including polypharmacy specifically benzodiazepine overdose, hyponatremia, OSA/OHS decompensation, sepsis, and hypoglycemia. Patient was admitted to the intensive care unit. He was stabilized. He was subsequently transitioned to the stepdown unit.  Past medical history:  Past Medical History:  Diagnosis Date  . 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 CHF (congestive heart failure) (HCC)    diastolic   . Chronic pain   . COPD (chronic obstructive pulmonary disease) ([place])   . Depression   . Diabetes mellitus without complication (HCC)    insulin pump  . Hypercholesterolemia   . On home O2    3 liters  . Schizophrenia ([place])   . Sleep apnea    noncompliant with BiPAP  . Tobacco abuse     Consultants: Psychiatry.  CULTURES: BCx2 9/1 > Urine 9/1 > neg Tracheal aspirate 9/1> Normal respiratory flora  ANTIBIOTICS: Cefepime 8/31 >>>9/5 Vancomycin 8/31 >>>9/5 Azithromycin 9/1>>>9/2 Ceftriaxone 9/5>> stop date 9/7   SIGNIFICANT  EVENTS: 8/31 - Presented to OSH ED w/ altered mentation 9/01 - Transfer to [facility] at family request 9/5- extubated  LINES/TUBES: ETT 8/31, 9/1 >>>9/5  Subjective: Patient noted to be very somnolent this morning. He is arousable but then goes right back to sleep. No history available from him at this time.   Objective:  Vital Signs  Vitals:   05/25/16 0258 05/25/16 0737 05/25/16 0808 05/25/16 1018  BP: (!) 148/83 (!) 118/101  (!) 146/82  Pulse: 66 79    Resp: 17 18    Temp: 97.8 F (36.6 C)  99.2 F (37.3 C)   TempSrc: Oral  Oral   SpO2: 97% 92%    Weight:      Height:        Intake/Output Summary (Last 24 hours) at 05/25/16 1030 Last data filed at 05/25/16 0742  Gross per 24 hour  Intake             1900 ml  Output             1000 ml  Net              900 ml   Filed Weights   05/21/16 0400 05/22/16 0415 05/23/16 0113  Weight: (!) 146.8 kg (323 lb 10.2 oz) (!) 145.6 kg (320 lb 15.8 oz) (!) 139 kg (306 lb 7 oz)    General appearance: Sleepy but arousable. In no distress.  Resp: Few crackles at the bases. No wheezing. No rhonchi. Diminished air entry bilaterally. Cardio: S1, S2 is irregularly irregular. No S3, S4. No rubs, murmurs, or bruit.  No pedal edema. GI: soft, non-tender; bowel sounds normal; no masses,  no organomegaly Extremities: extremities normal, atraumatic, no cyanosis or edema Neurologic: No obvious focal neurological deficits. Some twitching movements noted.   Lab Results:  Data Reviewed: I have personally reviewed following labs and imaging studies  CBC:  Recent Labs Lab 05/18/16 1200 05/19/16 0330 05/20/16 0400 05/21/16 0405 05/22/16 0424 05/23/16 0730 05/25/16 0320  WBC 27.0* 14.2* 8.9 7.7 7.4 13.6* 9.4  NEUTROABS 24.2* 12.2*  --   --   --   --   --   HGB 9.5* 8.9* 8.2* 8.4* 9.4* 10.0* 10.6*  HCT 34.2* 31.7* 28.4* 30.0* 32.6* 35.4* 36.9*  MCV 94.7 92.7 91.3 92.3 90.8 92.4 90.2  PLT 179 149* 146* 152 161 236 341   Basic Metabolic  Panel:  Recent Labs Lab 05/21/16 0405 05/22/16 0424 05/23/16 0730 05/23/16 1147 05/23/16 1700 05/24/16 0500 05/25/16 0320  NA 139 137 142  --   --  142 135  K 3.9 4.6 4.2  --   --  3.6 3.4*  CL 96* 94* 92*  --   --  93* 90*  CO2 37* 37* 38*  --   --  39* 37*  GLUCOSE 208* 271* 183*  --   --  174* 93  BUN 18 23* 22*  --   --  19 13  CREATININE 0.86 0.90 0.99  --   --  0.89 0.84  CALCIUM 8.6* 9.2 9.5  --   --  9.2 8.8*  MG 1.7 1.7  --  1.9 1.9 2.0  --   PHOS 2.0* 2.6  --  3.5 3.8 3.4  --    GFR: Estimated Creatinine Clearance: 120 mL/min (by C-G formula based on SCr of 0.84 mg/dL). Liver Function Tests:  Recent Labs Lab 05/18/16 1300 05/19/16 0330  AST  --  12*  ALT  --  8*  ALKPHOS  --  46  BILITOT  --  0.7  PROT  --  4.8*  ALBUMIN 2.5* 2.4*   Coagulation Profile:  Recent Labs Lab 05/18/16 1200  INR 1.21   Cardiac Enzymes:  Recent Labs Lab 05/18/16 1200 05/19/16 0330  CKTOTAL 51 123  TROPONINI 0.03*  --    CBG:  Recent Labs Lab 05/24/16 1707 05/24/16 2048 05/24/16 2324 05/25/16 0330 05/25/16 0752  GLUCAP 146* 218* 150* 101* 121*   Thyroid Function Tests:  Recent Labs  05/24/16 0501  TSH 1.333   Urine analysis:    Component Value Date/Time   COLORURINE YELLOW 05/17/2016 [place] 05/17/2016 1727   LABSPEC 1.025 05/17/2016 1727   PHURINE 5.0 05/17/2016 1727   GLUCOSEU NEGATIVE 05/17/2016 1727   HGBUR NEGATIVE 05/17/2016 1727   BILIRUBINUR NEGATIVE 05/17/2016 1727   KETONESUR NEGATIVE 05/17/2016 1727   PROTEINUR NEGATIVE 05/17/2016 1727   UROBILINOGEN 1.0 12/05/2014 1519   NITRITE NEGATIVE 05/17/2016 1727   LEUKOCYTESUR NEGATIVE 05/17/2016 1727    Recent Results (from the past 240 hour(s))  Culture, respiratory (NON-Expectorated)     Status: None   Collection Time: 05/18/16  2:06 AM  Result Value Ref Range Status   Specimen Description TRACHEAL ASPIRATE  Final   Special Requests Normal  Final   Gram Stain    Final    MODERATE WBC PRESENT,BOTH PMN AND MONONUCLEAR GRAM POSITIVE COCCI IN PAIRS IN CHAINS    Culture Consistent with normal respiratory flora.  Final   Report Status 05/20/2016 FINAL  Final  Culture, Urine  Status: None   Collection Time: 05/18/16  2:06 AM  Result Value Ref Range Status   Specimen Description URINE, CATHETERIZED  Final   Special Requests Normal  Final   Culture NO GROWTH  Final   Report Status 05/19/2016 FINAL  Final  MRSA PCR Screening     Status: None   Collection Time: 05/18/16  2:06 AM  Result Value Ref Range Status   MRSA by PCR NEGATIVE NEGATIVE Final    Comment:        The GeneXpert MRSA Assay (FDA approved for NASAL specimens only), is one component of a comprehensive MRSA colonization surveillance program. It is not intended to diagnose MRSA infection nor to guide or monitor treatment for MRSA infections.   Culture, blood (routine x 2)     Status: None   Collection Time: 05/18/16  3:45 AM  Result Value Ref Range Status   Specimen Description BLOOD LEFT HAND  Final   Special Requests AEROBIC BOTTLE ONLY 5ML  Final   Culture NO GROWTH 5 DAYS  Final   Report Status 05/23/2016 FINAL  Final  Culture, blood (routine x 2)     Status: None   Collection Time: 05/18/16  4:13 AM  Result Value Ref Range Status   Specimen Description BLOOD RIGHT HAND  Final   Special Requests IN PEDIATRIC BOTTLE 3ML  Final   Culture NO GROWTH 5 DAYS  Final   Report Status 05/23/2016 FINAL  Final      Radiology Studies: Dg Abd Portable 1v  Result Date: 05/23/2016 CLINICAL DATA:  Nasogastric tube placement.  Abdominal distention EXAM: PORTABLE ABDOMEN - 1 VIEW COMPARISON:  May 18, 2016 FINDINGS: Nasogastric tube tip and side port are in the stomach. There is generalized bowel dilatation. No free air evident. IMPRESSION: Nasogastric tube tip and side port in stomach. Diffuse bowel dilatation. Suspect ileus, although a degree of bowel obstruction cannot be excluded.  No free air evident. Electronically Signed   By: Lowella Grip III M.D.   On: 05/23/2016 11:21     Medications:  Scheduled: . apixaban  5 mg Oral BID  . chlorhexidine  15 mL Mouth Rinse BID  . citalopram  20 mg Oral Daily  . cloNIDine  0.1 mg Transdermal Weekly  . diltiazem  60 mg Oral Q6H  . famotidine (PEPCID) IV  20 mg Intravenous Q12H  . furosemide  20 mg Intravenous Daily  . insulin aspart  0-20 Units Subcutaneous Q4H  . insulin glargine  20 Units Subcutaneous Daily  . ipratropium-albuterol  3 mL Nebulization QID  . mouth rinse  15 mL Mouth Rinse QID  . metoprolol tartrate  25 mg Oral BID   Continuous: . diltiazem (CARDIZEM) infusion Stopped (05/23/16 1330)   XNA:TFTDDU chloride, acetaminophen, albuterol, hydrALAZINE, LORazepam, ondansetron (ZOFRAN) IV, oxyCODONE  Assessment/Plan:  Principal Problem:   Acute respiratory failure with hypoxia and hypercarbia (HCC) Active Problems:   Paranoid schizophrenia (HCC)   CAD (coronary artery disease)   DM (diabetes mellitus) ([place])   Chronic respiratory failure 02 dep with hypercarbia   Morbid obesity (HCC)   Acute encephalopathy   COPD (chronic obstructive pulmonary disease) (HCC)   Obesity hypoventilation syndrome (HCC)   Non compliance with medical treatment   Hypoglycemia   Hypercapnemia   Acute metabolic encephalopathy in the setting of known schizophrenia with possible drug overdose. Patient is difficult to arouse this morning. ABG was done which does not show any significant respiratory acidosis. His PCO2 is actually better compared to  the last checked value. The patient does wake up, but then he goes back to sleep. He does have some twitching movements. Proceed with the CT scan of the head has no new imaging studies have been done during this hospitalization yet. Also proceed with EEG. Stop all sedative agents including Ativan and narcotics. Patient was seen by psychiatry. He is not thought to be in danger to himself.  No indication for psychiatric hospitalization. Continue Celexa. Holding his home medications including gabapentin, trazodone and Geodon.  Acute on chronic mixed respiratory failure status post intubation and then extubation. Patient was in the intensive care unit. He was intubated and on mechanical ventilation. Patient's presentation was thought to be due to a combination of issues including medication effect, polypharmacy, hypoglycemia, obstructive sleep apnea/obesity hypoventilation syndrome. However, there was no clinical response to Narcan. Patient is stable currently. There was some concern for pneumonia for which he has completed a course of antibiotics. Titrate oxygen to maintain saturations around 90%.  Pneumonia, possible aspiration with sepsis Continuing course of antibiotics. He's completed course of steroids as well. Sepsis has resolved.  New onset atrial fibrillation. Patient found to be in atrial fibrillation in the ICU. He was on a diltiazem drip, which has been discontinued. He is now on oral Cardizem. Chads 2 vascular score is at least 3. He will benefit from anticoagulation. Initiated on Eliquis. Echocardiogram is pending. TSH is 1.33.  History of chronic diastolic congestive heart failure Currently stable and appears to be reasonably well compensated.  Insulin-dependent diabetes with initial hypoglycemia Patient was on an insulin pump at home. There is some concern for perhaps an unintentional overdose with insulin. Monitor CBGs closely. He is on Lantus here, which will be continued. HbA1c is 7.4.  Essential hypertension. Blood pressure has improved with initiation of beta blocker. Continue to monitor closely.   Hypokalemia. Will be repleted  Dysphagia. Seen by speech therapy. Started on a dysphagia diet. Monitor closely.  History of COPD See above. Stable currently. Continue nebulizer treatments.   Obstructive sleep apnea CPAP every night. But patient does not appear  to be very compliant.  DVT Prophylaxis: He is on Eliquis    Code Status: Full code  Family Communication: No family at bedside  Disposition Plan: Await CT head. EEG has been ordered. Will remain in step down for now. PT and OT evaluation.    LOS: 8 days   [facility] Pager [phone] 05/25/2016, 10:30 AM  If 7PM-7AM, please contact night-coverage at [email or web address], password [facility]

## 2016-05-25 NOTE — Care Management Note (Addendum)
Case Management Note  Patient Details  Name: DIANNE BADY MRN: [id number] Date of Birth: 05/19/1946  Subjective/Objective:   Patient transferred from 29M, on sucide precaution, bs of 19, ng tube to lw suction, he failed speech eval , they plan to see patient again, he is w/chair bound, on 4 liter [place], blind in r eye, very anxious, on home oxygn,await pt/ot eval.      9/8 Patient states his wife is there with him 24 hrs, NCM explained to patient that if he does not have someone with him 24 hrs that physical therapy is rec snf for him, he states he does not want to go to a facility.  He states he has a PCP, he has a rolling walker and a cane at home and he has oxygen with [facility] 3 liters. He states he would like to continue with [facility] for HHPT/HHOT, HHRN and 3 n 1, informed patient he will have co pay for 3 n 1 with insurance.  Referral made to [facility] with [facility]  For HHPT/HHOT, [facility] NCM will cont to follow for dc needs.  Also made referral to [facility] with [facility] for bsc.  Looks like Eliquis 5mg  bid started but no NCM referral for benefit check, if patient is going to continue on eliquis will do benefit check or test claim and he will need a 30 day savings card.                              Action/Plan:   Expected Discharge Date:                  Expected Discharge Plan:  [place]  In-House Referral:     Discharge planning Services  CM Consult  Post Acute Care Choice:    Choice offered to:     DME Arranged:    DME Agency:     HH Arranged:  PT, OT, RN [place] Agency:  [place]  Status of Service:  In process, will continue to follow  If discussed at Long Length of Stay Meetings, dates discussed:    Additional Comments:  Zenon Mayo, RN 05/25/2016, 12:30 PM

## 2016-05-25 NOTE — Progress Notes (Signed)
Occupational Therapy Evaluation Patient Details Name: Rick Cruz MRN: [id number] DOB: 12-24-1945 Today's Date: 05/25/2016    History of Present Illness 70 years old male admitted to [facility] with increased symptoms of confusion/altered mental status. Patient reportedly given overdose of insulin by his son and reportedly he was taken excessive pain medication. Currently on suicide precautions. Patient reportedly has known mental illness including depression, anxiety, bipolar mania, psychosis.. On home O2.   Clinical Impression   PTA, pt lived at home with wife and ambulated with RW short distance on home O2. Wife assisted as needed with ADL. Pt currently limited by fatigue. Requires min A with mobility and ADL. Pt with desat to low 80s on 4L with ambulation to bathroom. Unsure of accuracy of waveform. Pt felt light headed and and was unable to ambulate back to bed. At this time, if pt continues to progress he would be appropriate to D/C home with [place].    Follow Up Recommendations  Home health OT;Supervision/Assistance - 24 hour    Equipment Recommendations  3 in 1 bedside comode    Recommendations for Other Services       Precautions / Restrictions Precautions Precautions: Other (comment);Fall (suicide)      Mobility Bed Mobility Overal bed mobility: Needs Assistance Bed Mobility: Supine to Sit     Supine to sit: Supervision;HOB elevated        Transfers Overall transfer level: Needs assistance Equipment used: Rolling walker (2 wheeled) Transfers: Sit to/from Stand Sit to Stand: Min guard;+2 safety/equipment         General transfer comment: poor safety awareness    Balance Overall balance assessment: Needs assistance   Sitting balance-Leahy Scale: Good       Standing balance-Leahy Scale: Fair                              ADL Overall ADL's : Needs assistance/impaired     Grooming: Set up   Upper Body Bathing: Minimal  assitance Upper Body Bathing Details (indicate cue type and reason): due to fatigue Lower Body Bathing: Moderate assistance;Sit to/from stand   Upper Body Dressing : Minimal assistance   Lower Body Dressing: Moderate assistance;Sit to/from stand   Toilet Transfer: Minimal assistance;RW;Comfort height toilet;Ambulation   Toileting- Clothing Manipulation and Hygiene: Supervision/safety;Set up;Sitting/lateral lean       Functional mobility during ADLs: Minimal assistance;Rolling walker;Cueing for safety General ADL Comments: limited due to fatigue and SOB/weakness     Vision Additional Comments: blind R eye   Perception     Praxis      Pertinent Vitals/Pain Pain Assessment: Faces Faces Pain Scale: Hurts a little bit Pain Location: back Pain Descriptors / Indicators: Discomfort;Grimacing Pain Intervention(s): Limited activity within patient's tolerance     Hand Dominance Right   Extremity/Trunk Assessment Upper Extremity Assessment Upper Extremity Assessment: Generalized weakness   Lower Extremity Assessment Lower Extremity Assessment: Defer to PT evaluation   Cervical / Trunk Assessment Cervical / Trunk Assessment: Normal   Communication Communication Communication: No difficulties   Cognition Arousal/Alertness: Lethargic Behavior During Therapy: Flat affect Overall Cognitive Status: No family/caregiver present to determine baseline cognitive functioning  Pt able to problem solve with decreased safety, most likely close to baseline                   General Comments       Exercises       Shoulder Instructions  Home Living Family/patient expects to be discharged to:: Private residence Living Arrangements: Spouse/significant other Available Help at Discharge: Family;Available 24 hours/day Type of Home: House Home Access: Stairs to enter [employer] of Steps: 4 Entrance Stairs-Rails: Right       Bathroom Shower/Tub: Tub/shower  unit;Walk-in shower   Bathroom Toilet: Handicapped height Bathroom Accessibility: Yes How Accessible: Accessible via walker Home Equipment: Walker - standard;Grab bars - tub/shower          Prior Functioning/Environment Level of Independence: Needs assistance  Gait / Transfers Assistance Needed: used standard walker for short distances ADL's / Homemaking Assistance Needed: wife helped with bathing back and with feet/socks/shoes        OT Diagnosis: Generalized weakness;Acute pain;Altered mental status   OT Problem List: Decreased strength;Decreased activity tolerance;Impaired balance (sitting and/or standing);Decreased safety awareness;Decreased knowledge of use of DME or AE;Cardiopulmonary status limiting activity;Obesity;Pain;Decreased cognition   OT Treatment/Interventions: Self-care/ADL training;Therapeutic exercise;Energy conservation;DME and/or AE instruction;Therapeutic activities;Cognitive remediation/compensation;Patient/family education;Balance training    OT Goals(Current goals can be found in the care plan section) Acute Rehab OT Goals Patient Stated Goal: none stated OT Goal Formulation: With patient Time For Goal Achievement: 06/08/16 Potential to Achieve Goals: Good ADL Goals Pt Will Perform Lower Body Bathing: with min assist;sit to/from stand Pt Will Perform Lower Body Dressing: with min assist;sit to/from stand;with adaptive equipment Pt Will Transfer to Toilet: with supervision;ambulating;bedside commode Pt Will Perform Toileting - Clothing Manipulation and hygiene: with modified independence;sitting/lateral leans Pt Will Perform Tub/Shower Transfer: Shower transfer;shower seat;ambulating;rolling walker Additional ADL Goal #1: Pt will verbalize 3 energy conservation technqiues for ADL.  OT Frequency: Min 2X/week   Barriers to D/C:            Co-evaluation              End of Session Equipment Utilized During Treatment: Gait belt;Rolling  walker;Oxygen Nurse Communication: Mobility status  Activity Tolerance: Patient limited by fatigue Patient left: in chair;with call bell/phone within reach;with nursing/sitter in room   Time: 6286-3817 OT Time Calculation (min): 30 min Charges:  OT General Charges $OT Visit: 1 Procedure OT Evaluation $OT Eval Moderate Complexity: 1 Procedure G-Codes:    Rhylan Gross,HILLARY 06-18-16, 10:36 AM   Maurie Boettcher, OTR/L  [phone] 06/18/2016

## 2016-05-25 NOTE — Progress Notes (Signed)
Per report patient refused to wear his CPAP last night and continued on his nasal canula. Patient was extremely lethargic this morning. He was hard to arouse and would fall asleep before answering questions. Patient when answering questions thought he was at home and needed to take his clothes off. Pt attempted to put monitor cords into his mouth. Patient then became more drowsy and continued to be hard to arouse and his oxygen saturation dropped into the 70's. Rapid response came to the bedside, we had issues with the mask of CPAP so patient was placed on NRB for his saturations to improve. Pt was sat up and aroused. Once more awake and saturations improved pt was able to answer questions appropriately. ABG was done per MD order. Will continue to monitor.

## 2016-05-25 NOTE — Significant Event (Addendum)
Rapid Response Event Note  Overview: Time Called: 0805 Arrival Time: 0805 Event Type: Respiratory, Neurologic  Initial Focused Assessment: Per staff patient did not wear his cpap last night.  This am he is difficult to arouse and O2 sats decreased into the 70s.  Lung sounds decreased.  Interventions: No improvement in O2 sats after patient placed on cpap. Switched to 100% NRB for a few minutes, repositioned.  Encouraged to cough and deep breath. O2 sats improved to 98% and patient became more awake. Decrease O2 to 4L [place] and continued to encourage deep breathing and occ cough. O2 sats 92% Patient now able to answer questions and orientation has improved. ABG done  Plan of Care (if not transferred): Rn to call if assistance is needed.  Event Summary: Name of Physician Notified: Maryland Pink at      at    Outcome: Stayed in room and stabalized  Event End Time: 0845  Raliegh Ip

## 2016-05-25 NOTE — Procedures (Signed)
History: 70 yo M with encepahlopathy  Sedation: none  Technique: This is a 21 channel routine scalp EEG performed at the bedside with bipolar and monopolar montages arranged in accordance to the international 10/20 system of electrode placement. One channel was dedicated to EKG recording.    Background: There is a posterior dominant rhythm of 8 Hz which does attenuate with eye opening. In addition, there is mild diffuse irregular delta and theta activity.   Photic stimulation: Physiologic driving is not performed  EEG Abnormalities: 1) mild diffuse irregular slow activity.   Clinical Interpretation: This normal EEG is recorded in the waking state. There was no seizure or seizure predisposition recorded on this study. Please note that a normal EEG does not preclude the possibility of epilepsy.   Roland Rack, MD [facility] [phone]  If 7pm- 7am, please page neurology on call as listed in [place].

## 2016-05-25 NOTE — Progress Notes (Signed)
EEG completed, results pending. 

## 2016-05-26 ENCOUNTER — Inpatient Hospital Stay (HOSPITAL_COMMUNITY): Payer: PPO

## 2016-05-26 DIAGNOSIS — J9602 Acute respiratory failure with hypercapnia: Secondary | ICD-10-CM | POA: Diagnosis not present

## 2016-05-26 DIAGNOSIS — I4891 Unspecified atrial fibrillation: Secondary | ICD-10-CM | POA: Diagnosis not present

## 2016-05-26 DIAGNOSIS — J9611 Chronic respiratory failure with hypoxia: Secondary | ICD-10-CM | POA: Diagnosis not present

## 2016-05-26 DIAGNOSIS — J449 Chronic obstructive pulmonary disease, unspecified: Secondary | ICD-10-CM | POA: Diagnosis not present

## 2016-05-26 DIAGNOSIS — J9601 Acute respiratory failure with hypoxia: Secondary | ICD-10-CM | POA: Diagnosis not present

## 2016-05-26 DIAGNOSIS — E662 Morbid (severe) obesity with alveolar hypoventilation: Secondary | ICD-10-CM | POA: Diagnosis not present

## 2016-05-26 DIAGNOSIS — G9340 Encephalopathy, unspecified: Secondary | ICD-10-CM | POA: Diagnosis not present

## 2016-05-26 DIAGNOSIS — E118 Type 2 diabetes mellitus with unspecified complications: Secondary | ICD-10-CM | POA: Diagnosis not present

## 2016-05-26 LAB — GLUCOSE, CAPILLARY
GLUCOSE-CAPILLARY: 97 mg/dL (ref 65–99)
GLUCOSE-CAPILLARY: 116 mg/dL — AB (ref 65–99)
GLUCOSE-CAPILLARY: 153 mg/dL — AB (ref 65–99)
GLUCOSE-CAPILLARY: 150 mg/dL — AB (ref 65–99)
Glucose-Capillary: 174 mg/dL — ABNORMAL HIGH (ref 65–99)
Glucose-Capillary: 126 mg/dL — ABNORMAL HIGH (ref 65–99)

## 2016-05-26 LAB — ECHOCARDIOGRAM COMPLETE
AOASC: 39 cm
AOPV: 1.11 m/s
AOVTI: 41.5 cm
AV Area VTI index: 1.4 cm2/m2
AV VEL mean LVOT/AV: 1.01
AVAREAMEANVIN: 1.24 cm2/m2
AVG: 7 mmHg
AVPG: 16 mmHg
AVPKVEL: 198 cm/s
CHL CUP AV PEAK INDEX: 1.36
CHL CUP AV VALUE AREA INDEX: 1.4
CHL CUP DOP CALC LVOT VTI: 47.3 cm
CHL CUP MV DEC (S): 292
CHL CUP RV SYS PRESS: 33 mmHg
DOP CAL AO MEAN VELOCITY: 123 cm/s
E decel time: 292 msec
E/e' ratio: 13.06
FS: 31 % (ref 28–44)
HEIGHTINCHES: 72 in
IV/PV OW: 1.16
LA ID, A-P, ES: 38 mm
LA diam end sys: 38 mm
LA vol A4C: 71 ml
LADIAMINDEX: 1.49 cm/m2
LAVOL: 65 mL
LAVOLIN: 25.5 mL/m2
LV E/e' medial: 13.06
LV E/e'average: 13.06
LV TDI E'LATERAL: 8.27
LV e' LATERAL: 8.27 cm/s
LVOT area: 3.14 cm2
LVOT peak grad rest: 19 mmHg
LVOT peak vel: 219 cm/s
LVOTD: 20 mm
LVOTSV: 149 mL
LVOTVTI: 1.14 cm
Lateral S' vel: 13.5 cm/s
MV Peak grad: 5 mmHg
MV pk E vel: 108 m/s
MVPKAVEL: 115 m/s
PW: 14.3 mm — AB (ref 0.6–1.1)
Reg peak vel: 252 cm/s
TAPSE: 18.9 mm
TDI e' medial: 9.68
TR max vel: 252 cm/s
WEIGHTICAEL: 4903.03 [oz_av]

## 2016-05-26 MED ORDER — PANTOPRAZOLE SODIUM 40 MG PO TBEC
40.0000 mg | DELAYED_RELEASE_TABLET | Freq: Two times a day (BID) | ORAL | Status: DC
  Administered 2016-05-26 – 2016-05-29 (×6): 40 mg via ORAL
  Filled 2016-05-26 (×6): qty 1

## 2016-05-26 MED ORDER — DILTIAZEM HCL ER COATED BEADS 240 MG PO CP24
240.0000 mg | ORAL_CAPSULE | Freq: Every day | ORAL | Status: DC
  Administered 2016-05-26 – 2016-05-29 (×4): 240 mg via ORAL
  Filled 2016-05-26: qty 1
  Filled 2016-05-26: qty 2
  Filled 2016-05-26 (×2): qty 1

## 2016-05-26 MED ORDER — FUROSEMIDE 20 MG PO TABS
20.0000 mg | ORAL_TABLET | Freq: Every day | ORAL | Status: DC
  Administered 2016-05-27 – 2016-05-29 (×3): 20 mg via ORAL
  Filled 2016-05-26 (×3): qty 1

## 2016-05-26 MED ORDER — METOPROLOL TARTRATE 12.5 MG HALF TABLET
12.5000 mg | ORAL_TABLET | Freq: Two times a day (BID) | ORAL | Status: DC
  Administered 2016-05-26 – 2016-05-29 (×6): 12.5 mg via ORAL
  Filled 2016-05-26 (×6): qty 1

## 2016-05-26 NOTE — Progress Notes (Signed)
Spoke to [employer], RT, requesting BiPap for pt HS, as family insisted.  Pt has been non-compliant with CPAP at home, but family states that he is now ready to try again, as he tends to hold onto CO2 and O2 sats drop to 70s when sleeping.  PM RN is aware.

## 2016-05-26 NOTE — Progress Notes (Signed)
1128 - pt had 1st BM, soft, formed.  67 - recv'd report from [place] on [place], notifying of pt transfer.  1619 - pt had 2nd BM; was documented as "loose," prior to being edited.  1636 - pt arrives on [facility]; has 3rd BM and is assessed and documented by Glean Hess RN, as watery with mucous.  This stool sample was not sent to lab for C-diff testing, as RN mistakenly flushed after assessing.  Pt's oral temp recorded on [facility] arrival as 99.1, as this is pt's baseline and not considered febrile.  Pt did c/o abdominal pain prior to 3rd BM, but was relieved after BM.    Pt placed on C-diff precautions, and sample will be sent to lab as soon as available.

## 2016-05-26 NOTE — Progress Notes (Addendum)
MD paged to make aware of patient having 3 loose bowel movements today. Family reports history of cdiff to RN. Awaiting orders.   1624: MD placed orders while patient in transfer route to [facility]. Family and patient made aware of enteric precautions once on 3E and educated on gowns and gloves for enteric precaution with hand washing with soap and water.

## 2016-05-26 NOTE — Progress Notes (Signed)
Patient placed on BiPAP Qh's for nighttime, Tolerating well at this time. [place] left in so patient when he pulls off machine can still be receiving O2.

## 2016-05-26 NOTE — Progress Notes (Signed)
  Echocardiogram 2D Echocardiogram has been performed.  Rick Cruz 05/26/2016, 9:04 AM

## 2016-05-26 NOTE — Progress Notes (Signed)
[facility] PROGRESS NOTE  DARLY FAILS JYN:[id number] DOB: 06-10-1946 DOA: 05/17/2016  PCP: [name], MD  Brief HPI: 70 year old male with past medical history which is significant for CHF, OSA on home O2 (he had a sleep study 2016 recommending bi-level 13/7 which he reportedly does not use), COPD, diabetes, schizophrenia, and blindness of the right eye. He presented to [facility] emergency department 8/31 with confusion. Symptoms started at home where she checked his blood sugar and found to be greater than 300. She called 911 and administered 30 units of insulin. Mental status was then worsened, and repeat glucose in the emergency department was 19. He was administered D50 and Narcan, however, he continued to deteriorate complaining of shortness of breath and was eventually intubated. Of note, he has been admitted several times with similar complaints in the past. He does have a history of altered mental status admissions for various reasons including polypharmacy specifically benzodiazepine overdose, hyponatremia, OSA/OHS decompensation, sepsis, and hypoglycemia. Patient was admitted to the intensive care unit. He was stabilized. He was subsequently transitioned to the stepdown unit.  Past medical history:  Past Medical History:  Diagnosis Date  . 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 CHF (congestive heart failure) (HCC)    diastolic   . Chronic pain   . COPD (chronic obstructive pulmonary disease) ([place])   . Depression   . Diabetes mellitus without complication (HCC)    insulin pump  . Hypercholesterolemia   . On home O2    3 liters  . Schizophrenia ([place])   . Sleep apnea    noncompliant with BiPAP  . Tobacco abuse     Consultants: Psychiatry.  CULTURES: BCx2 9/1 > Urine 9/1 > neg Tracheal aspirate 9/1> Normal respiratory flora  ANTIBIOTICS: Cefepime 8/31 >>>9/5 Vancomycin 8/31 >>>9/5 Azithromycin 9/1>>>9/2 Ceftriaxone 9/5>> stop date 9/7   SIGNIFICANT  EVENTS: 8/31 - Presented to [facility] ED w/ altered mentation 9/01 - Transfer to [facility] at family request 9/5- extubated  EEG: EEG Abnormalities: 1) mild diffuse irregular slow activity.  Clinical Interpretation: This normal EEG is recorded in the waking state. There was no seizure or seizure predisposition recorded on this study. Please note that a normal EEG does not preclude the possibility of epilepsy.  LINES/TUBES: ETT 8/31, 9/1 >>>9/5  Subjective: Patient much more awake and alert today. Denies any complaints. His chronic pain is stable. Denies any shortness of breath. No nausea or vomiting.   Objective:  Vital Signs  Vitals:   05/25/16 2304 05/26/16 0246 05/26/16 0347 05/26/16 0709  BP:  (!) 125/59  (!) 141/84  Pulse:  (!) 53 (!) 56 66  Resp:  16 19 19   Temp:  99.5 F (37.5 C)  98.6 F (37 C)  TempSrc:  Axillary  Oral  SpO2: 94% 92% 93% 95%  Weight:      Height:        Intake/Output Summary (Last 24 hours) at 05/26/16 0751 Last data filed at 05/26/16 0657  Gross per 24 hour  Intake             1400 ml  Output              950 ml  Net              450 ml   Filed Weights   05/21/16 0400 05/22/16 0415 05/23/16 0113  Weight: (!) 146.8 kg (323 lb 10.2 oz) (!) 145.6 kg (320 lb 15.8 oz) Marland Kitchen)  139 kg (306 lb 7 oz)    General appearance: Sleepy but arousable. In no distress.  Resp: Diminished air entry at the bases. Few scattered wheezes. No rhonchi.  Cardio: S1, S2 is irregularly irregular. No S3, S4. No rubs, murmurs, or bruit. No pedal edema. GI: soft, non-tender; bowel sounds normal; no masses,  no organomegaly Extremities: extremities normal, atraumatic, no cyanosis or edema Neurologic: No obvious focal neurological deficits. Some twitching movements noted.   Lab Results:  Data Reviewed: I have personally reviewed following labs and imaging studies  CBC:  Recent Labs Lab 05/20/16 0400 05/21/16 0405 05/22/16 0424 05/23/16 0730 05/25/16 0320  WBC 8.9 7.7 7.4  13.6* 9.4  HGB 8.2* 8.4* 9.4* 10.0* 10.6*  HCT 28.4* 30.0* 32.6* 35.4* 36.9*  MCV 91.3 92.3 90.8 92.4 90.2  PLT 146* 152 161 236 856   Basic Metabolic Panel:  Recent Labs Lab 05/21/16 0405 05/22/16 0424 05/23/16 0730 05/23/16 1147 05/23/16 1700 05/24/16 0500 05/25/16 0320  NA 139 137 142  --   --  142 135  K 3.9 4.6 4.2  --   --  3.6 3.4*  CL 96* 94* 92*  --   --  93* 90*  CO2 37* 37* 38*  --   --  39* 37*  GLUCOSE 208* 271* 183*  --   --  174* 93  BUN 18 23* 22*  --   --  19 13  CREATININE 0.86 0.90 0.99  --   --  0.89 0.84  CALCIUM 8.6* 9.2 9.5  --   --  9.2 8.8*  MG 1.7 1.7  --  1.9 1.9 2.0  --   PHOS 2.0* 2.6  --  3.5 3.8 3.4  --    CBG:  Recent Labs Lab 05/25/16 1620 05/25/16 2000 05/25/16 2311 05/26/16 0318 05/26/16 0707  GLUCAP 124* 202* 76 97 116*   Thyroid Function Tests:  Recent Labs  05/24/16 0501  TSH 1.333   Urine analysis:    Component Value Date/Time   COLORURINE YELLOW 05/17/2016 [place] 05/17/2016 1727   LABSPEC 1.025 05/17/2016 1727   PHURINE 5.0 05/17/2016 1727   GLUCOSEU NEGATIVE 05/17/2016 1727   HGBUR NEGATIVE 05/17/2016 1727   BILIRUBINUR NEGATIVE 05/17/2016 1727   KETONESUR NEGATIVE 05/17/2016 1727   PROTEINUR NEGATIVE 05/17/2016 1727   UROBILINOGEN 1.0 12/05/2014 1519   NITRITE NEGATIVE 05/17/2016 1727   LEUKOCYTESUR NEGATIVE 05/17/2016 1727    Recent Results (from the past 240 hour(s))  Culture, respiratory (NON-Expectorated)     Status: None   Collection Time: 05/18/16  2:06 AM  Result Value Ref Range Status   Specimen Description TRACHEAL ASPIRATE  Final   Special Requests Normal  Final   Gram Stain   Final    MODERATE WBC PRESENT,BOTH PMN AND MONONUCLEAR GRAM POSITIVE COCCI IN PAIRS IN CHAINS    Culture Consistent with normal respiratory flora.  Final   Report Status 05/20/2016 FINAL  Final  Culture, Urine     Status: None   Collection Time: 05/18/16  2:06 AM  Result Value Ref Range Status    Specimen Description URINE, CATHETERIZED  Final   Special Requests Normal  Final   Culture NO GROWTH  Final   Report Status 05/19/2016 FINAL  Final  MRSA PCR Screening     Status: None   Collection Time: 05/18/16  2:06 AM  Result Value Ref Range Status   MRSA by PCR NEGATIVE NEGATIVE Final    Comment:  The GeneXpert MRSA Assay (FDA approved for NASAL specimens only), is one component of a comprehensive MRSA colonization surveillance program. It is not intended to diagnose MRSA infection nor to guide or monitor treatment for MRSA infections.   Culture, blood (routine x 2)     Status: None   Collection Time: 05/18/16  3:45 AM  Result Value Ref Range Status   Specimen Description BLOOD LEFT HAND  Final   Special Requests AEROBIC BOTTLE ONLY 5ML  Final   Culture NO GROWTH 5 DAYS  Final   Report Status 05/23/2016 FINAL  Final  Culture, blood (routine x 2)     Status: None   Collection Time: 05/18/16  4:13 AM  Result Value Ref Range Status   Specimen Description BLOOD RIGHT HAND  Final   Special Requests IN PEDIATRIC BOTTLE 3ML  Final   Culture NO GROWTH 5 DAYS  Final   Report Status 05/23/2016 FINAL  Final      Radiology Studies: Ct Head Wo Contrast  Result Date: 05/25/2016 CLINICAL DATA:  Sudden onset of altered mental status. EXAM: CT HEAD WITHOUT CONTRAST TECHNIQUE: Contiguous axial images were obtained from the base of the skull through the vertex without intravenous contrast. COMPARISON:  08/16/2015 FINDINGS: Brain: The brain shows generalized atrophy. There chronic appearing small vessel ischemic changes of the cerebral hemispheric white matter. No sign of acute infarction, mass lesion, hemorrhage, hydrocephalus or extra-axial collection. Vascular: There is atherosclerotic calcification of the major vessels at the base of the brain. Skull: Negative Sinuses/Orbits: Clear Other: None IMPRESSION: No acute finding by CT. Atrophy and chronic small-vessel ischemic changes of  the white matter. Electronically Signed   By: Nelson Chimes M.D.   On: 05/25/2016 13:48     Medications:  Scheduled: . apixaban  5 mg Oral BID  . chlorhexidine  15 mL Mouth Rinse BID  . citalopram  20 mg Oral Daily  . cloNIDine  0.1 mg Transdermal Weekly  . diltiazem  60 mg Oral Q6H  . famotidine  20 mg Oral BID  . furosemide  20 mg Intravenous Daily  . insulin aspart  0-20 Units Subcutaneous Q4H  . insulin glargine  20 Units Subcutaneous Daily  . ipratropium-albuterol  3 mL Nebulization QID  . mouth rinse  15 mL Mouth Rinse QID  . metoprolol tartrate  25 mg Oral BID   Continuous: . diltiazem (CARDIZEM) infusion Stopped (05/23/16 1330)   HCW:CBJSEG chloride, acetaminophen, albuterol, hydrALAZINE, ondansetron (ZOFRAN) IV  Assessment/Plan:  Principal Problem:   Acute respiratory failure with hypoxia and hypercarbia (HCC) Active Problems:   Paranoid schizophrenia (HCC)   CAD (coronary artery disease)   DM (diabetes mellitus) (HCC)   Chronic respiratory failure 02 dep with hypercarbia   Morbid obesity (HCC)   Acute encephalopathy   COPD (chronic obstructive pulmonary disease) (HCC)   Obesity hypoventilation syndrome (HCC)   Non compliance with medical treatment   Hypoglycemia   Hypercapnemia   Acute metabolic encephalopathy in the setting of known schizophrenia with possible drug overdose. Patient is improved this morning. His lethargy yesterday was most likely multifactorial including medications and noncompliance with CPap. ABG was done which did not show any significant respiratory acidosis. His PCO2 was actually better compared to the last checked value. CT scan of the head did not show any acute findings. EEG did not show any epileptiform activities. Stopped all sedative agents including Ativan and narcotics. Patient was seen by psychiatry. He is not thought to be in danger to himself. No  indication for psychiatric hospitalization. Continue Celexa. Holding his home  medications including gabapentin, trazodone and Geodon. Okay to discontinue sitter.  Acute on chronic mixed respiratory failure status post intubation and then extubation. Patient was in the intensive care unit. He was intubated and on mechanical ventilation. Patient's presentation was thought to be due to a combination of issues including medication effect, polypharmacy, hypoglycemia, obstructive sleep apnea/obesity hypoventilation syndrome. However, there was no clinical response to Narcan. Patient is stable currently. There was some concern for pneumonia for which he has completed a course of antibiotics. Titrate oxygen to maintain saturations around 90%. He is on home oxygen, which will need to be continued at discharge. Change to oral Lasix.  Pneumonia, possible aspiration with sepsis Continuing course of antibiotics. He's completed course of steroids as well. Sepsis has resolved.  New onset atrial fibrillation. Patient found to be in atrial fibrillation in the ICU. He was on a diltiazem drip, which has been discontinued. He is now on oral Cardizem. Chads 2 vascular score is at least 3. Initiated on Eliquis. Echocardiogram is pending. TSH is 1.33.  History of chronic diastolic congestive heart failure Currently stable and appears to be reasonably well compensated.  Insulin-dependent diabetes with initial hypoglycemia Patient was on an insulin pump at home. There is some concern for perhaps an unintentional overdose with insulin. Monitor CBGs closely. He is on Lantus here, which will be continued. HbA1c is 7.4. CBGs are reasonably well controlled. Would not discharge him on the insulin pump as he does not appear to have the cognitive capabilities needed to manage such pump on his own.  Essential hypertension. Blood pressure has improved with initiation of beta blocker. Continue to monitor closely. Heart rate noted to be lower. Cut back on the dose of beta blocker.  Hypokalemia. This was  repleted. Repeat labs tomorrow morning  Dysphagia. Seen by speech therapy. Started on a dysphagia diet. Monitor closely.  History of COPD See above. Stable currently. Continue nebulizer treatments.   Obstructive sleep apnea CPAP every night. But patient does not appear to be very compliant.  DVT Prophylaxis: He is on Eliquis    Code Status: Full code  Family Communication: No family at bedside  Disposition Plan: Improved this morning. Continue PT and OT evaluation. Might need placement. If he remains stable, he can be transferred to the floor later today.    LOS: 9 days   [facility] Pager [phone] 05/26/2016, 7:51 AM  If 7PM-7AM, please contact night-coverage at [email or web address], password [email or web address]

## 2016-05-27 DIAGNOSIS — J9611 Chronic respiratory failure with hypoxia: Secondary | ICD-10-CM | POA: Diagnosis not present

## 2016-05-27 DIAGNOSIS — J9612 Chronic respiratory failure with hypercapnia: Secondary | ICD-10-CM

## 2016-05-27 DIAGNOSIS — E118 Type 2 diabetes mellitus with unspecified complications: Secondary | ICD-10-CM | POA: Diagnosis not present

## 2016-05-27 DIAGNOSIS — J9601 Acute respiratory failure with hypoxia: Secondary | ICD-10-CM | POA: Diagnosis not present

## 2016-05-27 DIAGNOSIS — G9340 Encephalopathy, unspecified: Secondary | ICD-10-CM | POA: Diagnosis not present

## 2016-05-27 DIAGNOSIS — J9602 Acute respiratory failure with hypercapnia: Secondary | ICD-10-CM | POA: Diagnosis not present

## 2016-05-27 DIAGNOSIS — J449 Chronic obstructive pulmonary disease, unspecified: Secondary | ICD-10-CM | POA: Diagnosis not present

## 2016-05-27 DIAGNOSIS — E662 Morbid (severe) obesity with alveolar hypoventilation: Secondary | ICD-10-CM | POA: Diagnosis not present

## 2016-05-27 LAB — BASIC METABOLIC PANEL
Anion gap: 11 (ref 5–15)
BUN: 7 mg/dL (ref 6–20)
CHLORIDE: 90 mmol/L — AB (ref 101–111)
CO2: 29 mmol/L (ref 22–32)
CREATININE: 0.75 mg/dL (ref 0.61–1.24)
Calcium: 8.4 mg/dL — ABNORMAL LOW (ref 8.9–10.3)
GFR calc Af Amer: >60 mL/min (ref >60)
GFR calc non Af Amer: >60 mL/min (ref >60)
GLUCOSE: 122 mg/dL — AB (ref 65–99)
Potassium: 4.3 mmol/L (ref 3.5–5.1)
Sodium: 130 mmol/L — ABNORMAL LOW (ref 135–145)

## 2016-05-27 LAB — CBC
HEMATOCRIT: 37 % — AB (ref 39.0–52.0)
HEMOGLOBIN: 11.4 g/dL — AB (ref 13.0–17.0)
MCH: 26.6 pg (ref 26.0–34.0)
MCHC: 30.8 g/dL (ref 30.0–36.0)
MCV: 86.2 fL (ref 78.0–100.0)
Platelets: 287 10*3/uL (ref 150–400)
RBC: 4.29 MIL/uL (ref 4.22–5.81)
RDW: 13.2 % (ref 11.5–15.5)
WBC: 11.6 10*3/uL — ABNORMAL HIGH (ref 4.0–10.5)

## 2016-05-27 LAB — C DIFFICILE QUICK SCREEN W PCR REFLEX
C Diff antigen: NEGATIVE
C Diff interpretation: NOT DETECTED
C Diff toxin: NEGATIVE

## 2016-05-27 LAB — GLUCOSE, CAPILLARY
GLUCOSE-CAPILLARY: 116 mg/dL — AB (ref 65–99)
GLUCOSE-CAPILLARY: 225 mg/dL — AB (ref 65–99)
GLUCOSE-CAPILLARY: 188 mg/dL — AB (ref 65–99)
Glucose-Capillary: 169 mg/dL — ABNORMAL HIGH (ref 65–99)
Glucose-Capillary: 180 mg/dL — ABNORMAL HIGH (ref 65–99)

## 2016-05-27 NOTE — Progress Notes (Signed)
Pt. placed on BiPAP auto for h/s with 3 lpm n/c left under mask, pt. has tendency to pull off mask, huumidity filled, tolereting well, RN made aware.Marland Kitchen

## 2016-05-27 NOTE — Progress Notes (Signed)
[facility] PROGRESS NOTE  Rick Cruz DGU:[id number] DOB: 1946-07-25 DOA: 05/17/2016  PCP: [name], MD  Brief HPI: 70 year old male with past medical history which is significant for CHF, OSA on home O2 (he had a sleep study 2016 recommending bi-level 13/7 which he reportedly does not use), COPD, diabetes, schizophrenia, and blindness of the right eye. He presented to [facility] emergency department 8/31 with confusion. Symptoms started at home where she checked his blood sugar and found to be greater than 300. She called 911 and administered 30 units of insulin. Mental status was then worsened, and repeat glucose in the emergency department was 19. He was administered D50 and Narcan, however, he continued to deteriorate complaining of shortness of breath and was eventually intubated. Of note, he has been admitted several times with similar complaints in the past. He does have a history of altered mental status admissions for various reasons including polypharmacy specifically benzodiazepine overdose, hyponatremia, OSA/OHS decompensation, sepsis, and hypoglycemia. Patient was admitted to the intensive care unit. He was stabilized. He was subsequently transitioned to the stepdown unit.  Past medical history:  Past Medical History:  Diagnosis Date  . 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 CHF (congestive heart failure) (HCC)    diastolic   . Chronic pain   . COPD (chronic obstructive pulmonary disease) ([place])   . Depression   . Diabetes mellitus without complication (HCC)    insulin pump  . Hypercholesterolemia   . On home O2    3 liters  . Schizophrenia ([place])   . Sleep apnea    noncompliant with BiPAP  . Tobacco abuse     Consultants: Psychiatry.  CULTURES: BCx2 9/1 > Urine 9/1 > neg Tracheal aspirate 9/1> Normal respiratory flora  ANTIBIOTICS: Cefepime 8/31 >>>9/5 Vancomycin 8/31 >>>9/5 Azithromycin 9/1>>>9/2 Ceftriaxone 9/5>> stop date 9/7   SIGNIFICANT  EVENTS: 8/31 - Presented to [facility] ED w/ altered mentation 9/01 - Transfer to [facility] at family request 9/5- extubated  EEG: EEG Abnormalities: 1) mild diffuse irregular slow activity.  Clinical Interpretation: This normal EEG is recorded in the waking state. There was no seizure or seizure predisposition recorded on this study. Please note that a normal EEG does not preclude the possibility of epilepsy.  Transthoracic echocardiogram Study Conclusions  - Procedure narrative: Transthoracic echocardiography. Image   quality was poor. The study was technically difficult, as a   result of poor acoustic windows, poor sound wave transmission,   poor patient compliance, and body habitus. - Left ventricle: The cavity size was normal. Wall thickness was   increased in a pattern of moderate LVH. Systolic function was   normal. The estimated ejection fraction was in the range of 60%   to 65%. Wall motion was normal; there were no regional wall   motion abnormalities. Features are consistent with a pseudonormal   left ventricular filling pattern, with concomitant abnormal   relaxation and increased filling pressure (grade 2 diastolic   dysfunction). - Aortic valve: Mildly calcified leaflets. There was no stenosis. - Mitral valve: Mildly calcified annulus. - Pulmonary arteries: PA peak pressure: 33 mm Hg (S).   LINES/TUBES: ETT 8/31, 9/1 >>>9/5  Subjective: Patient denies any complaints this morning. He feels quite well. He slept well overnight. He understands the importance of using his BiPAP whenever he is sleeping. No further episodes of loose stools.  Objective:  Vital Signs  Vitals:   05/26/16 2200 05/27/16 0111 05/27/16 0200 05/27/16 0600  BP: (!) 113/93  (!) 162/77 138/77  Pulse: 61 64 62 75  Resp: 16 20  18   Temp: 99 F (37.2 C)  99.1 F (37.3 C) 98.4 F (36.9 C)  TempSrc: Oral Oral Oral Oral  SpO2: 97% 100%  98%  Weight:      Height:        Intake/Output Summary (Last 24  hours) at 05/27/16 0828 Last data filed at 05/26/16 2300  Gross per 24 hour  Intake           808.33 ml  Output             1800 ml  Net          -991.67 ml   Filed Weights   05/22/16 0415 05/23/16 0113 05/26/16 1636  Weight: (!) 145.6 kg (320 lb 15.8 oz) (!) 139 kg (306 lb 7 oz) 135.4 kg (298 lb 8.1 oz)    General appearance: Awake, alert. In no distress. Resp: Diminished air entry at the bases. Few scattered wheezes. No rhonchi.  Cardio: S1, S2 is irregularly irregular. No S3, S4. No rubs, murmurs, or bruit. No pedal edema. GI: soft, non-tender; bowel sounds normal; no masses,  no organomegaly Extremities: extremities normal, atraumatic, no cyanosis or edema Neurologic: No obvious focal neurological deficits.    Lab Results:  Data Reviewed: I have personally reviewed following labs and imaging studies  CBC:  Recent Labs Lab 05/21/16 0405 05/22/16 0424 05/23/16 0730 05/25/16 0320 05/27/16 0427  WBC 7.7 7.4 13.6* 9.4 11.6*  HGB 8.4* 9.4* 10.0* 10.6* 11.4*  HCT 30.0* 32.6* 35.4* 36.9* 37.0*  MCV 92.3 90.8 92.4 90.2 86.2  PLT 152 161 236 257 025   Basic Metabolic Panel:  Recent Labs Lab 05/21/16 0405 05/22/16 0424 05/23/16 0730 05/23/16 1147 05/23/16 1700 05/24/16 0500 05/25/16 0320 05/27/16 0427  NA 139 137 142  --   --  142 135 130*  K 3.9 4.6 4.2  --   --  3.6 3.4* 4.3  CL 96* 94* 92*  --   --  93* 90* 90*  CO2 37* 37* 38*  --   --  39* 37* 29  GLUCOSE 208* 271* 183*  --   --  174* 93 122*  BUN 18 23* 22*  --   --  19 13 7   CREATININE 0.86 0.90 0.99  --   --  0.89 0.84 0.75  CALCIUM 8.6* 9.2 9.5  --   --  9.2 8.8* 8.4*  MG 1.7 1.7  --  1.9 1.9 2.0  --   --   PHOS 2.0* 2.6  --  3.5 3.8 3.4  --   --    CBG:  Recent Labs Lab 05/26/16 1200 05/26/16 1517 05/26/16 1648 05/26/16 2154 05/27/16 0502  GLUCAP 153* 150* 174* 126* 116*   Urine analysis:    Component Value Date/Time   COLORURINE YELLOW 05/17/2016 1727   APPEARANCEUR CLEAR 05/17/2016  1727   LABSPEC 1.025 05/17/2016 1727   PHURINE 5.0 05/17/2016 1727   GLUCOSEU NEGATIVE 05/17/2016 1727   HGBUR NEGATIVE 05/17/2016 1727   BILIRUBINUR NEGATIVE 05/17/2016 1727   KETONESUR NEGATIVE 05/17/2016 1727   PROTEINUR NEGATIVE 05/17/2016 1727   UROBILINOGEN 1.0 12/05/2014 1519   NITRITE NEGATIVE 05/17/2016 1727   LEUKOCYTESUR NEGATIVE 05/17/2016 1727    Recent Results (from the past 240 hour(s))  Culture, respiratory (NON-Expectorated)     Status: None   Collection Time: 05/18/16  2:06 AM  Result Value Ref Range  Status   Specimen Description TRACHEAL ASPIRATE  Final   Special Requests Normal  Final   Gram Stain   Final    MODERATE WBC PRESENT,BOTH PMN AND MONONUCLEAR GRAM POSITIVE COCCI IN PAIRS IN CHAINS    Culture Consistent with normal respiratory flora.  Final   Report Status 05/20/2016 FINAL  Final  Culture, Urine     Status: None   Collection Time: 05/18/16  2:06 AM  Result Value Ref Range Status   Specimen Description URINE, CATHETERIZED  Final   Special Requests Normal  Final   Culture NO GROWTH  Final   Report Status 05/19/2016 FINAL  Final  MRSA PCR Screening     Status: None   Collection Time: 05/18/16  2:06 AM  Result Value Ref Range Status   MRSA by PCR NEGATIVE NEGATIVE Final    Comment:        The GeneXpert MRSA Assay (FDA approved for NASAL specimens only), is one component of a comprehensive MRSA colonization surveillance program. It is not intended to diagnose MRSA infection nor to guide or monitor treatment for MRSA infections.   Culture, blood (routine x 2)     Status: None   Collection Time: 05/18/16  3:45 AM  Result Value Ref Range Status   Specimen Description BLOOD LEFT HAND  Final   Special Requests AEROBIC BOTTLE ONLY 5ML  Final   Culture NO GROWTH 5 DAYS  Final   Report Status 05/23/2016 FINAL  Final  Culture, blood (routine x 2)     Status: None   Collection Time: 05/18/16  4:13 AM  Result Value Ref Range Status   Specimen  Description BLOOD RIGHT HAND  Final   Special Requests IN PEDIATRIC BOTTLE 3ML  Final   Culture NO GROWTH 5 DAYS  Final   Report Status 05/23/2016 FINAL  Final  C difficile quick scan w PCR reflex     Status: None   Collection Time: 05/27/16  1:25 AM  Result Value Ref Range Status   C Diff antigen NEGATIVE NEGATIVE Final   C Diff toxin NEGATIVE NEGATIVE Final   C Diff interpretation No C. difficile detected.  Final      Radiology Studies: Ct Head Wo Contrast  Result Date: 05/25/2016 CLINICAL DATA:  Sudden onset of altered mental status. EXAM: CT HEAD WITHOUT CONTRAST TECHNIQUE: Contiguous axial images were obtained from the base of the skull through the vertex without intravenous contrast. COMPARISON:  08/16/2015 FINDINGS: Brain: The brain shows generalized atrophy. There chronic appearing small vessel ischemic changes of the cerebral hemispheric white matter. No sign of acute infarction, mass lesion, hemorrhage, hydrocephalus or extra-axial collection. Vascular: There is atherosclerotic calcification of the major vessels at the base of the brain. Skull: Negative Sinuses/Orbits: Clear Other: None IMPRESSION: No acute finding by CT. Atrophy and chronic small-vessel ischemic changes of the white matter. Electronically Signed   By: Nelson Chimes M.D.   On: 05/25/2016 13:48     Medications:  Scheduled: . apixaban  5 mg Oral BID  . chlorhexidine  15 mL Mouth Rinse BID  . citalopram  20 mg Oral Daily  . cloNIDine  0.1 mg Transdermal Weekly  . diltiazem  240 mg Oral Daily  . famotidine  20 mg Oral BID  . furosemide  20 mg Oral Daily  . insulin aspart  0-20 Units Subcutaneous Q4H  . insulin glargine  20 Units Subcutaneous Daily  . ipratropium-albuterol  3 mL Nebulization QID  . mouth rinse  15  mL Mouth Rinse QID  . metoprolol tartrate  12.5 mg Oral BID  . pantoprazole  40 mg Oral BID   Continuous:   AOZ:HYQMVHQIONGEX, albuterol, hydrALAZINE, ondansetron (ZOFRAN)  IV  Assessment/Plan:  Principal Problem:   Acute respiratory failure with hypoxia and hypercarbia (HCC) Active Problems:   Paranoid schizophrenia (HCC)   CAD (coronary artery disease)   DM (diabetes mellitus) (HCC)   Chronic respiratory failure 02 dep with hypercarbia   Morbid obesity (HCC)   Acute encephalopathy   COPD (chronic obstructive pulmonary disease) (HCC)   Obesity hypoventilation syndrome (HCC)   Non compliance with medical treatment   Hypoglycemia   Hypercapnemia   Acute metabolic encephalopathy in the setting of known schizophrenia with possible drug overdose. Patient continues to improve. He understands the importance of using BiPAP while he is asleep. CT scan of the head did not show any acute findings. EEG did not show any epileptiform activities. Stopped all sedative agents including Ativan and narcotics. Patient was seen by psychiatry. He is not thought to be in danger to himself. No indication for psychiatric hospitalization. Continue Celexa. Holding his home medications including gabapentin, trazodone and Geodon.  Acute on chronic mixed respiratory failure status post intubation and then extubation. Patient was in the intensive care unit. He was intubated and on mechanical ventilation. Patient's presentation was thought to be due to a combination of issues including medication effect, polypharmacy, hypoglycemia, obstructive sleep apnea/obesity hypoventilation syndrome. However, there was no clinical response to Narcan. Patient is stable currently. There was some concern for pneumonia for which he has completed a course of antibiotics. Titrate oxygen to maintain saturations around 90%. He is on home oxygen, which will need to be continued at discharge. Changed to oral Lasix. He has been told the importance of using his BiPAP at home.  Pneumonia, possible aspiration with sepsis Patient has completed course of antibiotics. He's completed course of steroids as well. Sepsis  has resolved.  New onset atrial fibrillation. Patient found to be in atrial fibrillation in the ICU. He was on a diltiazem drip briefly. He is now on oral Cardizem. Chads 2 vascular score is at least 3. Initiated on Eliquis. Echocardiogram report is as above. TSH is 1.33.  History of chronic diastolic congestive heart failure Currently stable and appears to be reasonably well compensated. Echocardiogram report as above. Systolic function is normal.  Insulin-dependent diabetes with initial hypoglycemia Patient was on an insulin pump at home. There is some concern for perhaps an unintentional overdose with insulin. Monitor CBGs closely. He is on Lantus here, which will be continued. HbA1c is 7.4. CBGs are reasonably well controlled. Would not discharge him on the insulin pump as he does not appear to have the cognitive capabilities needed to manage such pump on his own.  Essential hypertension. Blood pressure has improved with initiation of beta blocker. Continue to monitor closely. Heart rate was noted to be low , so the dose of his beta blocker was cut down. Heart rate is better.  Hypokalemia. This was repleted.   Dysphagia. Seen by speech therapy. Started on a dysphagia diet. Monitor closely.  History of COPD See above. Stable currently. Continue nebulizer treatments.   Obstructive sleep apnea BiPAP every night. But patient does not appear to be very compliant.  Loose stools Resolved. C. difficile was negative.  DVT Prophylaxis: He is on Eliquis    Code Status: Full code  Family Communication: No family at bedside  Disposition Plan: Patient continues to improve. Await repeat  PT evaluation. This will determine whether he will be able to go home or will need to go to skilled nursing facility for rehabilitation. Anticipate discharge in the next 24-48 hours.    LOS: 10 days   [facility] Pager [phone] 05/27/2016, 8:28 AM  If 7PM-7AM, please contact  night-coverage at [email or web address], password [facility]

## 2016-05-27 NOTE — Progress Notes (Signed)
Pt OOB to chair, with one person assist. Chair alarm in place.

## 2016-05-28 DIAGNOSIS — G9340 Encephalopathy, unspecified: Secondary | ICD-10-CM | POA: Diagnosis not present

## 2016-05-28 DIAGNOSIS — J9601 Acute respiratory failure with hypoxia: Secondary | ICD-10-CM | POA: Diagnosis not present

## 2016-05-28 DIAGNOSIS — J449 Chronic obstructive pulmonary disease, unspecified: Secondary | ICD-10-CM | POA: Diagnosis not present

## 2016-05-28 DIAGNOSIS — E662 Morbid (severe) obesity with alveolar hypoventilation: Secondary | ICD-10-CM | POA: Diagnosis not present

## 2016-05-28 DIAGNOSIS — J9611 Chronic respiratory failure with hypoxia: Secondary | ICD-10-CM | POA: Diagnosis not present

## 2016-05-28 DIAGNOSIS — J9602 Acute respiratory failure with hypercapnia: Secondary | ICD-10-CM | POA: Diagnosis not present

## 2016-05-28 DIAGNOSIS — E118 Type 2 diabetes mellitus with unspecified complications: Secondary | ICD-10-CM | POA: Diagnosis not present

## 2016-05-28 LAB — GLUCOSE, CAPILLARY
GLUCOSE-CAPILLARY: 117 mg/dL — AB (ref 65–99)
GLUCOSE-CAPILLARY: 141 mg/dL — AB (ref 65–99)
Glucose-Capillary: 109 mg/dL — ABNORMAL HIGH (ref 65–99)
Glucose-Capillary: 117 mg/dL — ABNORMAL HIGH (ref 65–99)
Glucose-Capillary: 183 mg/dL — ABNORMAL HIGH (ref 65–99)
Glucose-Capillary: 204 mg/dL — ABNORMAL HIGH (ref 65–99)
Glucose-Capillary: 255 mg/dL — ABNORMAL HIGH (ref 65–99)
Glucose-Capillary: 271 mg/dL — ABNORMAL HIGH (ref 65–99)
Glucose-Capillary: 69 mg/dL (ref 65–99)

## 2016-05-28 MED ORDER — ACETAMINOPHEN 650 MG RE SUPP: 650.0000 mg | Freq: Four times a day (QID) | RECTAL | 0 refills | Status: DC | PRN

## 2016-05-28 MED ORDER — GLUCERNA SHAKE PO LIQD
237.0000 mL | Freq: Three times a day (TID) | ORAL | Status: DC
  Administered 2016-05-28 – 2016-05-29 (×3): 237 mL via ORAL

## 2016-05-28 MED ORDER — LOPERAMIDE HCL 2 MG PO CAPS
4.0000 mg | ORAL_CAPSULE | Freq: Once | ORAL | Status: AC
Start: 2016-05-28 — End: 2016-05-28
  Administered 2016-05-28: 4 mg via ORAL
  Filled 2016-05-28: qty 2

## 2016-05-28 MED ORDER — GABAPENTIN 300 MG PO CAPS: 300.0000 mg | ORAL_CAPSULE | Freq: Two times a day (BID) | ORAL | 0 refills | Status: DC

## 2016-05-28 MED ORDER — INSULIN GLARGINE 100 UNIT/ML ~~LOC~~ SOLN: 20.0000 [IU] | Freq: Every day | SUBCUTANEOUS | 11 refills | Status: DC

## 2016-05-28 MED ORDER — DILTIAZEM HCL ER COATED BEADS 240 MG PO CP24: 240.0000 mg | ORAL_CAPSULE | Freq: Every day | ORAL | 2 refills | Status: DC

## 2016-05-28 MED ORDER — METOPROLOL TARTRATE 25 MG PO TABS: 12.5000 mg | ORAL_TABLET | Freq: Two times a day (BID) | ORAL | 0 refills | Status: DC

## 2016-05-28 MED ORDER — TERAZOSIN HCL 1 MG PO CAPS: 1.0000 mg | ORAL_CAPSULE | Freq: Every day | ORAL | 0 refills | Status: DC

## 2016-05-28 MED ORDER — LOPERAMIDE HCL 2 MG PO CAPS: 2.0000 mg | ORAL_CAPSULE | Freq: Three times a day (TID) | ORAL | Status: DC | PRN

## 2016-05-28 MED ORDER — CLONIDINE HCL 0.1 MG/24HR TD PTWK: 0.1000 mg | MEDICATED_PATCH | TRANSDERMAL | 1 refills | Status: DC

## 2016-05-28 MED ORDER — APIXABAN 5 MG PO TABS: 5.0000 mg | ORAL_TABLET | Freq: Two times a day (BID) | ORAL | 1 refills | Status: DC

## 2016-05-28 MED ORDER — ACETAMINOPHEN 325 MG PO TABS
650.0000 mg | ORAL_TABLET | Freq: Four times a day (QID) | ORAL | Status: DC | PRN
  Administered 2016-05-28 – 2016-05-29 (×2): 650 mg via ORAL
  Filled 2016-05-28 (×2): qty 2

## 2016-05-28 NOTE — Care Management Important Message (Signed)
Important Message  Patient Details  Name: Rick Cruz MRN: [id number] Date of Birth: 04-Jun-1946   Medicare Important Message Given:  Yes    [name] [address] 05/28/2016, 2:51 PM

## 2016-05-28 NOTE — Progress Notes (Signed)
Occupational Therapy Treatment Patient Details Name: Rick Cruz MRN: [id number] DOB: Jun 11, 1946 Today's Date: 05/28/2016    History of present illness 70 years old male admitted to [facility] with increased symptoms of confusion/altered mental status. Patient reportedly given overdose of insulin by his son and reportedly he was taken excessive pain medication. Currently on suicide precautions. Patient reportedly has known mental illness including depression, anxiety, bipolar mania, psychosis.. On home O2.   OT comments  Pt making gradual progress toward OT goals; continues to be limited by fatigue with functional activities. Pt able to perform LB bathing/peri care in standing with min guard assist; requested to sit down due to fatigue. Requires min guard assist +2 for safety for functional mobility. SpO2 down to 90% on 3L O2 during functional activities; returned to 95% in sitting following activity. D/c plan remains appropriate. Will continue to follow acutely.   Follow Up Recommendations  Home health OT;Supervision/Assistance - 24 hour    Equipment Recommendations  3 in 1 bedside comode    Recommendations for Other Services      Precautions / Restrictions Precautions Precautions: Other (comment);Fall (suicide) Restrictions Weight Bearing Restrictions: No       Mobility Bed Mobility Overal bed mobility: Needs Assistance Bed Mobility: Supine to Sit     Supine to sit: Supervision;HOB elevated     General bed mobility comments: No physical assist required, supervision for safety  Transfers Overall transfer level: Needs assistance Equipment used: Rolling walker (2 wheeled) Transfers: Sit to/from Stand Sit to Stand: Min guard;+2 safety/equipment         General transfer comment: poor safety awareness, modified hand placement when powering up with push off through RW when standing from bed    Balance Overall balance assessment: Needs assistance   Sitting  balance-Leahy Scale: Good Sitting balance - Comments: able to sit EOB and on BSC and performed hygiene activities   Standing balance support: Single extremity supported;During functional activity Standing balance-Leahy Scale: Poor Standing balance comment: Required single extremity UE support during hygiene and pericare. Quickly fatigues.                   ADL Overall ADL's : Needs assistance/impaired     Grooming: Set up;Supervision/safety;Sitting;Wash/dry face       Lower Body Bathing: Min guard (Standing/Sitting) Lower Body Bathing Details (indicate cue type and reason): To clean peri area. Pt holding onto sink with L UE throughout and quick to fatigue. Requesting to sit down to finish task. Upper Body Dressing : Set up;Supervision/safety;Sitting Upper Body Dressing Details (indicate cue type and reason): to don hospital gown     Toilet Transfer: Min guard;+2 for safety/equipment;Ambulation;BSC;RW Toilet Transfer Details (indicate cue type and reason): Simulated by sit to stand from EOB with functional mobility.         Functional mobility during ADLs: Min guard;+2 for safety/equipment;Rolling walker General ADL Comments: Pt quick to fatigue during functional activities; requesting to sit down in middle of ADL activity. SpO2 down to 90 on 3L O2 during functional activity; returned to 95% in sitting.      Vision                     Perception     Praxis      Cognition   Behavior During Therapy: [facility] for tasks assessed/performed (a bit jovial) Overall Cognitive Status: No family/caregiver present to determine baseline cognitive functioning  Extremity/Trunk Assessment               Exercises     Shoulder Instructions       General Comments      Pertinent Vitals/ Pain       Pain Assessment: Faces Faces Pain Scale: No hurt  Home Living                                          Prior  Functioning/Environment              Frequency Min 2X/week     Progress Toward Goals  OT Goals(current goals can now be found in the care plan section)  Progress towards OT goals: Progressing toward goals  Acute Rehab OT Goals Patient Stated Goal: none stated OT Goal Formulation: With patient  Plan Discharge plan remains appropriate    Co-evaluation    PT/OT/SLP Co-Evaluation/Treatment: Yes Reason for Co-Treatment: Necessary to address cognition/behavior during functional activity   OT goals addressed during session: ADL's and self-care      End of Session Equipment Utilized During Treatment: Gait belt;Oxygen;Rolling walker   Activity Tolerance Patient limited by fatigue   Patient Left in chair;with call bell/phone within reach;with chair alarm set   Nurse Communication Mobility status        Time: 7867-6720 OT Time Calculation (min): 16 min  Charges: OT General Charges $OT Visit: 1 Procedure OT Treatments $Self Care/Home Management : 8-22 mins  Binnie Kand M.S., OTR/L Pager: [phone]  05/28/2016, 12:10 PM

## 2016-05-28 NOTE — Progress Notes (Signed)
[facility] PROGRESS NOTE  Rick Cruz CWC:[id number] DOB: Jan 25, 1946 DOA: 05/17/2016  PCP: [name], MD  Brief HPI: 70 year old male with past medical history which is significant for CHF, OSA on home O2 (he had a sleep study 2016 recommending bi-level 13/7 which he reportedly does not use), COPD, diabetes, schizophrenia, and blindness of the right eye. He presented to [facility] emergency department 8/31 with confusion. Symptoms started at home where she checked his blood sugar and found to be greater than 300. She called 911 and administered 30 units of insulin. Mental status was then worsened, and repeat glucose in the emergency department was 19. He was administered D50 and Narcan, however, he continued to deteriorate complaining of shortness of breath and was eventually intubated. Of note, he has been admitted several times with similar complaints in the past. He does have a history of altered mental status admissions for various reasons including polypharmacy specifically benzodiazepine overdose, hyponatremia, OSA/OHS decompensation, sepsis, and hypoglycemia. Patient was admitted to the intensive care unit. He was stabilized. He was subsequently transitioned to the stepdown unit and then to the floor.  Past medical history:  Past Medical History:  Diagnosis Date  . 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 CHF (congestive heart failure) (HCC)    diastolic   . Chronic pain   . COPD (chronic obstructive pulmonary disease) ([place])   . Depression   . Diabetes mellitus without complication (HCC)    insulin pump  . Hypercholesterolemia   . On home O2    3 liters  . Schizophrenia ([place])   . Sleep apnea    noncompliant with BiPAP  . Tobacco abuse     Consultants: Psychiatry.  CULTURES: BCx2 9/1 > Urine 9/1 > neg Tracheal aspirate 9/1> Normal respiratory flora  ANTIBIOTICS: Cefepime 8/31 >>>9/5 Vancomycin 8/31 >>>9/5 Azithromycin 9/1>>>9/2 Ceftriaxone 9/5>> 9/7    SIGNIFICANT EVENTS: 8/31 - Presented to OSH ED w/ altered mentation 9/01 - Transfer to [facility] at family request 9/5- extubated  EEG: EEG Abnormalities: 1) mild diffuse irregular slow activity.  Clinical Interpretation: This normal EEG is recorded in the waking state. There was no seizure or seizure predisposition recorded on this study. Please note that a normal EEG does not preclude the possibility of epilepsy.  Transthoracic echocardiogram Study Conclusions  - Procedure narrative: Transthoracic echocardiography. Image   quality was poor. The study was technically difficult, as a   result of poor acoustic windows, poor sound wave transmission,   poor patient compliance, and body habitus. - Left ventricle: The cavity size was normal. Wall thickness was   increased in a pattern of moderate LVH. Systolic function was   normal. The estimated ejection fraction was in the range of 60%   to 65%. Wall motion was normal; there were no regional wall   motion abnormalities. Features are consistent with a pseudonormal   left ventricular filling pattern, with concomitant abnormal   relaxation and increased filling pressure (grade 2 diastolic   dysfunction). - Aortic valve: Mildly calcified leaflets. There was no stenosis. - Mitral valve: Mildly calcified annulus. - Pulmonary arteries: PA peak pressure: 33 mm Hg (S).   LINES/TUBES: ETT 8/31, 9/1 >>>9/5  Subjective: Patient feels well this morning. He denies any complaints. He understands the importance of using his BiPAP whenever he is sleeping or taking a nap. No further episodes of loose stools.   Objective:  Vital Signs  Vitals:   05/27/16 2127 05/27/16 2300 05/28/16 0500  05/28/16 0747  BP:  (!) 150/80 (!) 162/64   Pulse:  71    Resp:   20   Temp:   98.7 F (37.1 C)   TempSrc:   Oral   SpO2: 97%  97% 94%  Weight:   132.3 kg (291 lb 9.6 oz)   Height:        Intake/Output Summary (Last 24 hours) at 05/28/16 0750 Last data  filed at 05/28/16 0700  Gross per 24 hour  Intake              720 ml  Output              775 ml  Net              -55 ml   Filed Weights   05/23/16 0113 05/26/16 1636 05/28/16 0500  Weight: (!) 139 kg (306 lb 7 oz) 135.4 kg (298 lb 8.1 oz) 132.3 kg (291 lb 9.6 oz)    General appearance: Awake, alert. In no distress. Resp: Diminished air entry at the bases. Few scattered wheezes. No rhonchi.  Cardio: S1, S2 is irregularly irregular. No S3, S4. No rubs, murmurs, or bruit. No pedal edema. GI: soft, non-tender; bowel sounds normal; no masses,  no organomegaly Extremities: extremities normal, atraumatic, no cyanosis or edema Neurologic: No obvious focal neurological deficits.    Lab Results:  Data Reviewed: I have personally reviewed following labs and imaging studies  CBC:  Recent Labs Lab 05/22/16 0424 05/23/16 0730 05/25/16 0320 05/27/16 0427  WBC 7.4 13.6* 9.4 11.6*  HGB 9.4* 10.0* 10.6* 11.4*  HCT 32.6* 35.4* 36.9* 37.0*  MCV 90.8 92.4 90.2 86.2  PLT 161 236 257 497   Basic Metabolic Panel:  Recent Labs Lab 05/22/16 0424 05/23/16 0730 05/23/16 1147 05/23/16 1700 05/24/16 0500 05/25/16 0320 05/27/16 0427  NA 137 142  --   --  142 135 130*  K 4.6 4.2  --   --  3.6 3.4* 4.3  CL 94* 92*  --   --  93* 90* 90*  CO2 37* 38*  --   --  39* 37* 29  GLUCOSE 271* 183*  --   --  174* 93 122*  BUN 23* 22*  --   --  19 13 7   CREATININE 0.90 0.99  --   --  0.89 0.84 0.75  CALCIUM 9.2 9.5  --   --  9.2 8.8* 8.4*  MG 1.7  --  1.9 1.9 2.0  --   --   PHOS 2.6  --  3.5 3.8 3.4  --   --    CBG:  Recent Labs Lab 05/27/16 1059 05/27/16 1606 05/27/16 2034 05/28/16 0006 05/28/16 0526  GLUCAP 188* 169* 180* 109* 141*   Urine analysis:    Component Value Date/Time   COLORURINE YELLOW 05/17/2016 [place] 05/17/2016 1727   LABSPEC 1.025 05/17/2016 1727   PHURINE 5.0 05/17/2016 1727   GLUCOSEU NEGATIVE 05/17/2016 1727   HGBUR NEGATIVE 05/17/2016 1727     BILIRUBINUR NEGATIVE 05/17/2016 1727   KETONESUR NEGATIVE 05/17/2016 1727   PROTEINUR NEGATIVE 05/17/2016 1727   UROBILINOGEN 1.0 12/05/2014 1519   NITRITE NEGATIVE 05/17/2016 1727   LEUKOCYTESUR NEGATIVE 05/17/2016 1727    Recent Results (from the past 240 hour(s))  C difficile quick scan w PCR reflex     Status: None   Collection Time: 05/27/16  1:25 AM  Result Value Ref Range Status   C Diff  antigen NEGATIVE NEGATIVE Final   C Diff toxin NEGATIVE NEGATIVE Final   C Diff interpretation No C. difficile detected.  Final      Radiology Studies: No results found.   Medications:  Scheduled: . apixaban  5 mg Oral BID  . chlorhexidine  15 mL Mouth Rinse BID  . citalopram  20 mg Oral Daily  . cloNIDine  0.1 mg Transdermal Weekly  . diltiazem  240 mg Oral Daily  . famotidine  20 mg Oral BID  . furosemide  20 mg Oral Daily  . insulin aspart  0-20 Units Subcutaneous Q4H  . insulin glargine  20 Units Subcutaneous Daily  . ipratropium-albuterol  3 mL Nebulization QID  . mouth rinse  15 mL Mouth Rinse QID  . metoprolol tartrate  12.5 mg Oral BID  . pantoprazole  40 mg Oral BID   Continuous:   YQM:[id number], albuterol, hydrALAZINE, ondansetron (ZOFRAN) IV  Assessment/Plan:  Principal Problem:   Acute respiratory failure with hypoxia and hypercarbia (HCC) Active Problems:   Paranoid schizophrenia (HCC)   CAD (coronary artery disease)   DM (diabetes mellitus) (HCC)   Chronic respiratory failure 02 dep with hypercarbia   Morbid obesity (HCC)   Acute encephalopathy   COPD (chronic obstructive pulmonary disease) (HCC)   Obesity hypoventilation syndrome (HCC)   Non compliance with medical treatment   Hypoglycemia   Hypercapnemia   Acute metabolic encephalopathy in the setting of known schizophrenia with possible drug overdose. Patient continues to improve. He understands the importance of using BiPAP while he is asleep. CT scan of the head did not show any acute  findings. EEG did not show any epileptiform activities. Stopped all sedative agents including Ativan and narcotics. Patient was seen by psychiatry. He is not thought to be a danger to himself. No indication for psychiatric hospitalization. Continue Celexa. Holding his Other home medications including gabapentin, trazodone and Geodon.  Acute on chronic mixed respiratory failure status post intubation and then extubation. Patient was in the intensive care unit. He was intubated and on mechanical ventilation. Patient's presentation was thought to be due to a combination of issues including medication effect, polypharmacy, hypoglycemia, obstructive sleep apnea/obesity hypoventilation syndrome. However, there was no clinical response to Narcan. Patient is stable currently. There was some concern for pneumonia for which he has completed a course of antibiotics. Titrate oxygen to maintain saturations around 90%. He is on home oxygen, which will need to be continued at discharge. Changed to oral Lasix. He has been told the importance of using his BiPAP at home.  Pneumonia, possible aspiration with sepsis Patient has completed course of antibiotics. He's completed course of steroids as well. Sepsis has resolved.  New onset atrial fibrillation. Patient found to be in atrial fibrillation in the ICU. He was on a diltiazem drip briefly. He is now on oral Cardizem. Chads 2 vascular score is at least 3. Initiated on Eliquis. Echocardiogram report is as above. TSH is 1.33.  History of chronic diastolic congestive heart failure Currently stable and appears to be reasonably well compensated. Echocardiogram report as above. Systolic function is normal.  Insulin-dependent diabetes with initial hypoglycemia Patient was on an insulin pump at home. There is some concern for perhaps an unintentional overdose with insulin. Monitor CBGs closely. He is on Lantus here, which will be continued. HbA1c is 7.4. CBGs are reasonably  well controlled. Would not discharge him on the insulin pump as he does not appear to have the cognitive capabilities needed to manage such  pump on his own.  Essential hypertension. Blood pressure has improved with initiation of beta blocker. Continue to monitor closely. Heart rate was noted to be low, so the dose of his beta blocker was cut down. Heart rate is better.  Hypokalemia. This was repleted.   Dysphagia. Seen by speech therapy. Started on a dysphagia diet, which he seems to be tolerating well. Monitor closely.  History of COPD See above. Stable currently. Continue nebulizer treatments.   Obstructive sleep apnea/obesity hypoventilation syndrome/hypercapnia BiPAP every night. Patient has been explained the importance of being compliant.  Loose stools Resolved. C. difficile was negative.  DVT Prophylaxis: He is on Eliquis    Code Status: Full code  Family Communication: No family at bedside  Disposition Plan: Patient continues to improve. Long discussion with his wife last night. Apparently, their son who was in his 25s and was helping the patient at home, passed away recently unexpectedly. It appears that patient is still requiring a lot of assistance. Patient's wife states that she may not be able to provide the level of care that he was getting previously at home. In view of this, will request social worker to evaluate for placement. Patient appears to be medically improving. Anticipate discharge later today or tomorrow.     LOS: 11 days   [facility] Pager [phone] 05/28/2016, 7:50 AM  If 7PM-7AM, please contact night-coverage at [email or web address], password [facility]

## 2016-05-28 NOTE — Progress Notes (Signed)
Physical Therapy Treatment Patient Details Name: Rick Cruz MRN: [id number] DOB: 22-Nov-1945 Today's Date: 05/28/2016    History of Present Illness 70 years old male admitted to [facility] with increased symptoms of confusion/altered mental status. Patient reportedly given overdose of insulin by his son and reportedly he was taken excessive pain medication. Currently on suicide precautions. Patient reportedly has known mental illness including depression, anxiety, bipolar mania, psychosis.. On home O2.    PT Comments    Patient seen for mobility progression. Patient tolerated increased ambulation distance on 4 liters with min guard for safety. Patient easily fatigued with mobility. Returned to room for additional 6 minutes to discussion pursed lip breathing and nasal inhalation as well as energy conservation techniques. Patient appears receptive but question carry over at this time. Will continue to see and progress as tolerated.  Follow Up Recommendations  Home health PT;Supervision/Assistance - 24 hour (if does not progress or have adequate assist will need SNF)     Equipment Recommendations  Other (comment) (TBD)    Recommendations for Other Services       Precautions / Restrictions Precautions Precautions: Other (comment);Fall (suicide) Restrictions Weight Bearing Restrictions: No    Mobility  Bed Mobility Overal bed mobility: Needs Assistance Bed Mobility: Supine to Sit     Supine to sit: Supervision;HOB elevated     General bed mobility comments: No physical assist required, supervision for safety  Transfers Overall transfer level: Needs assistance Equipment used: Rolling walker (2 wheeled) Transfers: Sit to/from Stand Sit to Stand: Min guard;+2 safety/equipment         General transfer comment: poor safety awareness, modified hand placement when powering up with push off through RW when standing from bed  Ambulation/Gait Ambulation/Gait  assistance: Min guard Ambulation Distance (Feet): 40 Feet Assistive device: Rolling walker (2 wheeled) Gait Pattern/deviations: Step-to pattern;Decreased stride length;Drifts right/left;Trunk flexed;Wide base of support Gait velocity: decreased Gait velocity interpretation: Below normal speed for age/gender General Gait Details: slow gait, some instability with flexed posutre. ambulated on 4 liters. (desaturation to 90% on 4 liters with limited ambulation)   Stairs            Wheelchair Mobility    Modified Rankin (Stroke Patients Only)       Balance Overall balance assessment: Needs assistance   Sitting balance-Leahy Scale: Good Sitting balance - Comments: able to sit EOB and on BSC and performed hygiene activities   Standing balance support: Single extremity supported;During functional activity Standing balance-Leahy Scale: Poor Standing balance comment: Required single extremity UE support during hygiene and pericare. Quickly fatigues.                    Cognition Arousal/Alertness: Awake/alert Behavior During Therapy: WFL for tasks assessed/performed (a bit jovial) Overall Cognitive Status: No family/caregiver present to determine baseline cognitive functioning                      Exercises      General Comments General comments (skin integrity, edema, etc.): noted rash over patient's back      Pertinent Vitals/Pain Pain Assessment: Faces Faces Pain Scale: No hurt    Home Living                      Prior Function            PT Goals (current goals can now be found in the care plan section) Acute Rehab PT Goals Patient Stated  Goal: none stated PT Goal Formulation: Patient unable to participate in goal setting Time For Goal Achievement: 06/08/16 Potential to Achieve Goals: Fair Progress towards PT goals: Progressing toward goals    Frequency  Min 3X/week    PT Plan Current plan remains appropriate    Co-evaluation  PT/OT/SLP Co-Evaluation/Treatment: Yes (dove tailed session) Reason for Co-Treatment: Necessary to address cognition/behavior during functional activity   OT goals addressed during session: ADL's and self-care     End of Session Equipment Utilized During Treatment: Gait belt;Oxygen Activity Tolerance: Patient limited by fatigue Patient left: in chair;with call bell/phone within reach;with nursing/sitter in room     Time: 7981-0254 PT Time Calculation (min) (ACUTE ONLY): 19 min (initial time with OT 0948 am then dove tailed ambulation and returned to room for additional energy conservation activities.  Charges:  $Gait Training: 8-22 mins                    G CodesDuncan Dull 2016-06-11, 1:42 PM Alben Deeds, [place] DPT  [phone]

## 2016-05-28 NOTE — Progress Notes (Signed)
Nutrition Follow-up  DOCUMENTATION CODES:   Morbid obesity  INTERVENTION:  Provide Glucerna Shake po TID with meals, each supplement provides 220 kcal and 10 grams of protein   NUTRITION DIAGNOSIS:   Inadequate oral intake related to dysphagia as evidenced by NPO status.  Ongoing; diet advanced but PO intake remains inadequate  GOAL:   Patient will meet greater than or equal to 90% of their needs  Unmet  MONITOR:   Labs, Weight trends, TF tolerance, I & O's  REASON FOR ASSESSMENT:   Consult Enteral/tube feeding initiation and management  ASSESSMENT:   70 year old male with past medical history significant for CHF, OSA on home O2 (he had a sleep study 2016 recommending bi-level 13/7 which he reportedly does not use), COPD, diabetes, schizophrenia, and blindness of the right eye. Found to by hypercarbic/hypoxemic requiring intubation. Concern for PNA superimposed on OSA/OHS. Treat with broad spectrum ABX. Close glucose monitoring. Supportive care on vent.   Pt reports having a poor appetite and states that he has only been eating 25-50% of his meals. He denies difficulty swallowing food. Per SLP note, pt is able to feed himself; pt has dentures, but they are at home- pt prefers to stay on current diet at this time. Pt's weight is down 9 lbs from admission weight.  Labs: low sodium, low chloride, low calcium, glucose ranging 109 to 225 mg/dL, low hemoglobin  Diet Order:  DIET DYS 2 Room service appropriate? Yes; Fluid consistency: Thin  Skin:  Reviewed, no issues  Last BM:  9/9  Height:   Ht Readings from Last 1 Encounters:  05/26/16 6' (1.829 m)    Weight:   Wt Readings from Last 1 Encounters:  05/28/16 291 lb 9.6 oz (132.3 kg)    Ideal Body Weight:  80.91 kg  BMI:  Body mass index is 39.55 kg/m.  Estimated Nutritional Needs:   Kcal:  2200-2400  Protein:  130-140 gm  Fluid:  2.2-2.4 L  EDUCATION NEEDS:   No education needs identified at this  time  Scarlette Ar RD, LDN, CSP Inpatient Clinical Dietitian Pager: [phone] After Hours Pager: [phone]

## 2016-05-28 NOTE — Progress Notes (Signed)
Speech Language Pathology Treatment: (P) Dysphagia  Patient Details Name: Rick Cruz MRN: [id number] DOB: 1946-02-11 Today's Date: 05/28/2016 Time: (P) 1041-(P) 1051 SLP Time Calculation (min) (ACUTE ONLY): (P) 10 min  Assessment / Plan / Recommendation Clinical Impression  F/u diet tolerance assessment revealed great improvement in overall function since most recent treatment. Patient alert and cooperative, able to self feeding and consume current diet without overt indication of dysphagia or aspiration. Patient has dentures but at home. Per patient report, he prefers to stay on current diet at this time, despite ability to [facility] regular texture solids today without overt indication of aspiration, for energy conservation. Patient may advance when he feels appropriate to regular texture solids. No SLP f/u indicated.    HPI HPI: 43) 70 year old male with past medical history as below, which is significant for CHF, OSA on home O2 (he had a sleep study 2016 recommending bi-level 13/7 which he reportedly does not use), COPD, diabetes, schizophrenia, and blindness of the right eye. He presented to [facility] emergency department 8/31 with confusion. Symptoms started at home where she checked his blood sugar and found to be greater than 300. She called 911 and administered 30 units of insulin. Mental status was then worsened, and repeat glucose in the emergency department was 19. He was administered D50 and Narcan, however, he continued to deteriorate complaining of shortness of breath and was eventually intubated. Plan was to admit the patient for hypoxic/hypercarbic respiratory failure, however, daily requested transfer to [facility]. Prior to transfer he apparently improved and was extubated in the emergency department. Re-intubated 9/1- 9/5.      SLP Plan  (P) All goals met     Recommendations  Diet recommendations: (P) Dysphagia 2 (fine chop);Thin liquid Liquids provided via: (P)  Cup;Straw Medication Administration: (P) Whole meds with liquid Supervision: (P) Patient able to self feed;Intermittent supervision to cue for compensatory strategies Compensations: (P) Slow rate;Small sips/bites Postural Changes and/or Swallow Maneuvers: (P) Seated upright 90 degrees             Oral Care Recommendations: (P) Oral care BID Follow up Recommendations: (P) None Plan: (P) All goals met     GO             Rick Rainwater MA, CCC-SLP [phone]    Rick Cruz Rick Cruz 05/28/2016, 10:58 AM

## 2016-05-28 NOTE — Discharge Summary (Signed)
[facility]  Physician Discharge Summary   Patient ID: Rick Cruz MRN: [id number] DOB/AGE: October 19, 1945 70 y.o.  Admit date: 05/17/2016 Discharge date: 05/29/2016  PCP: [name], MD  DISCHARGE DIAGNOSES:  Principal Problem:   Acute respiratory failure with hypoxia and hypercarbia ([place]) Active Problems:   Paranoid schizophrenia ([place])   CAD (coronary artery disease)   DM (diabetes mellitus) ([place])   Chronic respiratory failure 02 dep with hypercarbia   Morbid obesity ([place])   Acute encephalopathy   COPD (chronic obstructive pulmonary disease) ([facility])   Obesity hypoventilation syndrome ([facility])   Non compliance with medical treatment   Hypoglycemia   Hypercapnemia   RECOMMENDATIONS FOR OUTPATIENT FOLLOW UP: CBC and basic metabolic panel in 1 week.  Use BiPAP at 13/7 every night and while taking daytime naps.  Avoid narcotics as much as possible. Speech Therapy to continue to follow.   DISCHARGE CONDITION: fair  Diet recommendation: Modified carbohydrate in the form of dysphagia 2 diet with thin liquids  Filed Weights   05/26/16 1636 05/28/16 0500 05/29/16 0407  Weight: 135.4 kg (298 lb 8.1 oz) 132.3 kg (291 lb 9.6 oz) 130 kg (286 lb 9.6 oz)    INITIAL HISTORY: 70 year old male with past medical history which is significant for CHF, OSA on home O2 (he had a sleep study 2016 recommending bi-level 13/7 which he reportedly does not use), COPD, diabetes, schizophrenia, and blindness of the right eye. He presented to [facility] emergency department 8/31 with confusion. Symptoms started at home where she checked his blood sugar and found to be greater than 300. She called 911 and administered 30 units of insulin. Mental status was then worsened, and repeat glucose in the emergency department was 19. He was administered D50 and Narcan, however, he continued to deteriorate complaining of shortness of breath and was eventually intubated. Of note, he has been admitted several times  with similar complaints in the past. He does have a history of altered mental status admissions for various reasons including polypharmacy specifically benzodiazepine overdose, hyponatremia, OSA/OHS decompensation, sepsis, and hypoglycemia. Patient was admitted to the intensive care unit. He was stabilized. He was subsequently transitioned to the stepdown unit and then to the floor.  Consultations:  Psychiatry  CULTURES: BCx2 9/1 > Urine 9/1 >neg Tracheal aspirate 9/1>Normal respiratory flora  ANTIBIOTICS: Cefepime 8/31 >>>9/5 Vancomycin 8/31 >>>9/5 Azithromycin 9/1>>>9/2 Ceftriaxone 9/5>>9/7  SIGNIFICANT EVENTS: 8/31 - Presented to OSH ED w/ altered mentation 9/01 - Transfer to [facility] at family request 9/5- extubated  EEG: EEG Abnormalities: 1) mild diffuse irregular slow activity.  Clinical Interpretation: This normal EEG is recorded in the waking state. There was no seizure or seizure predisposition recorded on this study. Please note that a normal EEG does not preclude the possibility of epilepsy.  Transthoracic echocardiogram Study Conclusions  - Procedure narrative: Transthoracic echocardiography. Image quality was poor. The study was technically difficult, as a result of poor acoustic windows, poor sound wave transmission, poor patient compliance, and body habitus. - Left ventricle: The cavity size was normal. Wall thickness was increased in a pattern of moderate LVH. Systolic function was normal. The estimated ejection fraction was in the range of 60% to 65%. Wall motion was normal; there were no regional wall motion abnormalities. Features are consistent with a pseudonormal left ventricular filling pattern, with concomitant abnormal relaxation and increased filling pressure (grade 2 diastolic dysfunction). - Aortic valve: Mildly calcified leaflets. There was no stenosis. - Mitral valve: Mildly calcified annulus. - Pulmonary arteries: PA  peak pressure: 33 mm Hg (S).   LINES/TUBES: ETT 8/31, 9/1 >>>9/5   HOSPITAL COURSE:   Acute metabolic encephalopathy in the setting of known schizophrenia with possible drug overdose. Patient is significantly improved and is now at baseline. He understands the importance of using BiPAP while he is asleep. CT scan of the head did not show any acute findings. EEG did not show any epileptiform activities. Stopped all sedative agents including Ativan and narcotics. Patient was seen by psychiatry. He is not thought to be a danger to himself. No indication for psychiatric hospitalization. Continue Celexa. Dose of gabapentin has been reduced. Continue to hold trazodone and Geodon.   Acute on chronic mixed respiratory failure status post intubation and then extubation. Patient was in the intensive care unit. He was intubated and on mechanical ventilation. Patient's presentation was thought to be due to a combination of issues including medication effect, polypharmacy, hypoglycemia, obstructive sleep apnea/obesity hypoventilation syndrome. However, there was no clinical response to Narcan. Patient is stable currently. There was some concern for pneumonia for which he has completed a course of antibiotics. Titrate oxygen to maintain saturations around 90%. He is on home oxygen, which will be continued at discharge. Changed to oral Lasix. He has been told the importance of using his BiPAP at home.  Pneumonia, possible aspiration with sepsis Patient has completed course of antibiotics. He's completed course of steroids as well. Sepsis has resolved.  New onset paroxysmal atrial fibrillation. Patient found to be in atrial fibrillation in the ICU. He was on a diltiazem drip briefly. He is now on oral Cardizem. Chads 2 vascular score is at least 3. Initiated on Eliquis. Echocardiogram report is as above. TSH is 1.33. Currently in sinus rhythm.  History of chronic diastolic congestive heart  failure Currently stable and appears to be reasonably well compensated. Echocardiogram report as above. Systolic function is normal.  Insulin-dependent diabetes with initial hypoglycemia Patient was on an insulin pump at home. There is some concern for perhaps an unintentional overdose with insulin. CBGs monitored closely. His hypoglycemia resolved. He became hyperglycemic. He was started on Lantus, which is what will be continued. HbA1c is 7.4. CBGs are reasonably well controlled. Would not discharge him on the insulin pump as he does not appear to have the cognitive capabilities needed to manage such pump on his own.  Essential hypertension. Blood pressure has improved with initiation of beta blocker. Continue to monitor closely. Heart rate was noted to be low, so the dose of his beta blocker was cut down. Heart rate is now better.  Hypokalemia. This was repleted.   Dysphagia. Seen by speech therapy. Started on a dysphagia diet, which he seems to be tolerating well. Recommend continued follow-up by speech therapy at the skilled nursing facility.  History of COPD See above. Stable currently. Continue nebulizer treatments.   Obstructive sleep apnea/obesity hypoventilation syndrome/hypercapnia BiPAP every night. Patient has been explained the importance of being compliant.  Loose stools C. difficile was negative. Imodium as needed.  Overall, patient has improved. He is still deconditioned. His wife is unable to care for him at home at this time. Apparently their son, who was one of the main caregivers, passed away recently. So at this time the best disposition for him will be [place]. He is stable for discharge today.     PERTINENT LABS:  The results of significant diagnostics from this hospitalization (including imaging, microbiology, ancillary and laboratory) are listed below for reference.    Microbiology:  Recent Results (from the past 240 hour(s))  C  difficile quick scan w PCR reflex     Status: None   Collection Time: 05/27/16  1:25 AM  Result Value Ref Range Status   C Diff antigen NEGATIVE NEGATIVE Final   C Diff toxin NEGATIVE NEGATIVE Final   C Diff interpretation No C. difficile detected.  Final     Labs: Basic Metabolic Panel:  Recent Labs Lab 05/23/16 0730 05/23/16 1147 05/23/16 1700 05/24/16 0500 05/25/16 0320 05/27/16 0427  NA 142  --   --  142 135 130*  K 4.2  --   --  3.6 3.4* 4.3  CL 92*  --   --  93* 90* 90*  CO2 38*  --   --  39* 37* 29  GLUCOSE 183*  --   --  174* 93 122*  BUN 22*  --   --  19 13 7   CREATININE 0.99  --   --  0.89 0.84 0.75  CALCIUM 9.5  --   --  9.2 8.8* 8.4*  MG  --  1.9 1.9 2.0  --   --   PHOS  --  3.5 3.8 3.4  --   --    CBC:  Recent Labs Lab 05/23/16 0730 05/25/16 0320 05/27/16 0427  WBC 13.6* 9.4 11.6*  HGB 10.0* 10.6* 11.4*  HCT 35.4* 36.9* 37.0*  MCV 92.4 90.2 86.2  PLT 236 257 287   BNP: BNP (last 3 results)  Recent Labs  10/18/15 0115 05/17/16 1748  BNP 38.0 108.0*    CBG:  Recent Labs Lab 05/28/16 2011 05/28/16 2357 05/29/16 0037 05/29/16 0417 05/29/16 0818  GLUCAP 271* 69 75 186* 129*     IMAGING STUDIES Ct Head Wo Contrast  Result Date: 05/25/2016 CLINICAL DATA:  Sudden onset of altered mental status. EXAM: CT HEAD WITHOUT CONTRAST TECHNIQUE: Contiguous axial images were obtained from the base of the skull through the vertex without intravenous contrast. COMPARISON:  08/16/2015 FINDINGS: Brain: The brain shows generalized atrophy. There chronic appearing small vessel ischemic changes of the cerebral hemispheric white matter. No sign of acute infarction, mass lesion, hemorrhage, hydrocephalus or extra-axial collection. Vascular: There is atherosclerotic calcification of the major vessels at the base of the brain. Skull: Negative Sinuses/Orbits: Clear Other: None IMPRESSION: No acute finding by CT. Atrophy and chronic small-vessel ischemic changes of  the white matter. Electronically Signed   By: Nelson Chimes M.D.   On: 05/25/2016 13:48   Dg Chest Port 1 View  Result Date: 05/22/2016 CLINICAL DATA:  Respiratory failure. EXAM: PORTABLE CHEST 1 VIEW COMPARISON:  05/21/2016. FINDINGS: Endotracheal tube, NG tube, right IJ line stable position. Stable cardiomegaly. Bibasilar pulmonary infiltrates and or edema noted. Slight worsening from prior exam. Small bilateral pleural effusions. Stable left apical pleural thickening noted. IMPRESSION: 1. Lines and tubes in stable position. 2. Cardiomegaly cardiomegaly with bibasilar infiltrates and or edema. Slight worsening from prior exam. Small left pleural effusion. Electronically Signed   By: Marcello Moores  Register   On: 05/22/2016 06:52   Dg Chest Port 1 View  Result Date: 05/21/2016 CLINICAL DATA:  70 year old male with a history of acute respiratory failure EXAM: PORTABLE CHEST 1 VIEW COMPARISON:  05/20/2016, 05/19/2016, 05/18/2016 FINDINGS: Cardiomediastinal silhouette likely unchanged though partially obscured by overlying lung and pleural disease. Left rotation of the patient significantly degrades the image. Unchanged endotracheal tube terminating approximately 5.3 cm above the carina. Unchanged right IJ central venous catheter. Calcifications of the aorta. Gastric  tube unchanged, terminating out of the field of view. Similar appearance of mixed interstitial and airspace opacities of the bilateral lungs without significant improvement. Hazy opacity at the right base likely related the pleural fluid. IMPRESSION: Unchanged endotracheal tube and gastric tube compared to the prior. Unchanged right IJ central venous catheter. Unchanged appearance of the lungs with persisting mixed interstitial and airspace opacities, potentially a combination of atelectasis, edema, consolidation, and/ or pleural effusion. Aortic atherosclerosis. Signed, Dulcy Fanny. Earleen Newport, DO [facility]  Radiology Electronically Signed   By: Corrie Mckusick D.O.   On: 05/21/2016 08:09   Dg Chest Port 1 View  Result Date: 05/20/2016 CLINICAL DATA:  Sepsis, acute respiratory failure EXAM: PORTABLE CHEST 1 VIEW COMPARISON:  05/19/2016 FINDINGS: Endotracheal tube terminates 3 cm above the carina. Cardiomegaly with mild interstitial edema. Suspected small left pleural effusion. No pneumothorax. Right IJ venous catheter terminates in the mid SVC. Enteric tube courses below the diaphragm. IMPRESSION: Endotracheal tube terminates 3 cm above the carina. Cardiomegaly with mild interstitial edema and suspected small left pleural effusion. Electronically Signed   By: Julian Hy M.D.   On: 05/20/2016 07:25   Dg Chest Port 1 View  Result Date: 05/19/2016 CLINICAL DATA:  Aspiration pneumonia. EXAM: PORTABLE CHEST 1 VIEW COMPARISON:  05/18/2016 FINDINGS: The endotracheal tube is 4 cm above the carina. The NG tube is coursing down the esophagus and into the stomach. The right IJ catheter has been removed. The heart is enlarged but stable. Persistent bibasilar infiltrates. Possible small left effusion. IMPRESSION: Stable support apparatus except the right IJ catheter has been removed. Persistent bibasilar infiltrates and possible small left effusion. Electronically Signed   By: Marijo Sanes M.D.   On: 05/19/2016 07:54   Dg Chest Port 1 View  Result Date: 05/18/2016 CLINICAL DATA:  Central catheter placement.  Hypoxia. EXAM: PORTABLE CHEST 1 VIEW COMPARISON:  Study obtained earlier in the day FINDINGS: Central catheter tip is in the superior vena cava. Nasogastric tube tip and side port are below the diaphragm. Endotracheal tube tip is 3.9 cm above the carina. No pneumothorax. There is a slight degree of interstitial edema. Lungs otherwise are clear. No airspace consolidation. Heart is mildly enlarged with pulmonary vascular within normal limits. No adenopathy. There is aortic atherosclerotic calcification. IMPRESSION:  Tube and catheter positions as described without pneumothorax. Cardiomegaly with mild edema. Suspect a mild degree of congestive heart failure. No airspace consolidation. There is aortic atherosclerosis. Electronically Signed   By: Lowella Grip III M.D.   On: 05/18/2016 11:56   Dg Chest Port 1 View  Result Date: 05/18/2016 CLINICAL DATA:  Intubated patient.  Acute respiratory failure. EXAM: PORTABLE CHEST 1 VIEW COMPARISON:  Chest radiograph 05/17/2016 FINDINGS: Endotracheal tube tip remains in position at the level of the clavicular heads. The enteric tube has been removed. Unchanged cardiomegaly. Moderate pulmonary edema, increased from the prior study, with decreased aeration of the right lower lobe. Small right pleural effusion. No pneumothorax. IMPRESSION: 1. Endotracheal tube tip in unchanged position at the level of the clavicles. 2. Moderate pulmonary edema, increased from prior study. 3. Small right pleural effusion. Electronically Signed   By: Ulyses Jarred M.D.   On: 05/18/2016 02:48   Dg Chest Portable 1 View  Result Date: 05/17/2016 CLINICAL DATA:  Status post intubation. EXAM: PORTABLE CHEST 1 VIEW COMPARISON:  05/17/16 FINDINGS: ET tube tip is above the carina. There is a nasogastric tube with tip below the field of view. Cardiac  enlargement noted. Small pleural effusions are identified. Pulmonary vascular congestion. IMPRESSION: 1. Satisfactory position of ET tube with tip above the carina. 2. Mild CHF. Electronically Signed   By: Kerby Moors M.D.   On: 05/17/2016 18:31   Dg Chest Portable 1 View  Result Date: 05/17/2016 CLINICAL DATA:  Shortness of breath and altered mental status. EXAM: PORTABLE CHEST 1 VIEW COMPARISON:  02/20/2016 FINDINGS: Cardiomegaly and mild pulmonary vascular congestion noted. Mild peribronchial thickening is unchanged. The left retrocardiac region is difficult to evaluate on this study. No definite effusion or pneumothorax noted. IMPRESSION: Cardiomegaly  with mild pulmonary vascular congestion. Left retrocardiac region difficult to evaluate on this study. Electronically Signed   By: Margarette Canada M.D.   On: 05/17/2016 17:48   Dg Abd Portable 1v  Result Date: 05/23/2016 CLINICAL DATA:  Nasogastric tube placement.  Abdominal distention EXAM: PORTABLE ABDOMEN - 1 VIEW COMPARISON:  May 18, 2016 FINDINGS: Nasogastric tube tip and side port are in the stomach. There is generalized bowel dilatation. No free air evident. IMPRESSION: Nasogastric tube tip and side port in stomach. Diffuse bowel dilatation. Suspect ileus, although a degree of bowel obstruction cannot be excluded. No free air evident. Electronically Signed   By: Lowella Grip III M.D.   On: 05/23/2016 11:21   Dg Abd Portable 1v  Result Date: 05/18/2016 CLINICAL DATA:  Orogastric tube placement EXAM: PORTABLE ABDOMEN - 1 VIEW COMPARISON:  None. FINDINGS: The OG tube extends into the stomach with tip in the region of the proximal to mid gastric body. IMPRESSION: Orogastric tube extends into the stomach Electronically Signed   By: Andreas Newport M.D.   On: 05/18/2016 02:56    DISCHARGE EXAMINATION: Vitals:   05/28/16 1949 05/28/16 2208 05/29/16 0407 05/29/16 0822  BP: 133/76  139/86   Pulse: 71 73 64   Resp: 18 17 18    Temp: 97.9 F (36.6 C)  97.4 F (36.3 C)   TempSrc: Oral  Oral   SpO2: 94% 95% 99% 92%  Weight:   130 kg (286 lb 9.6 oz)   Height:       General appearance: alert, cooperative, appears stated age and no distress Resp: Much improved aeration bilaterally. Few scattered wheezes. No rhonchi. Cardio: regular rate and rhythm, S1, S2 normal, no murmur, click, rub or gallop GI: soft, non-tender; bowel sounds normal; no masses,  no organomegaly Extremities: extremities normal, atraumatic, no cyanosis or edema  DISPOSITION: SNF  Discharge Instructions    Call MD for:  difficulty breathing, headache or visual disturbances    Complete by:  As directed   Call MD for:   extreme fatigue    Complete by:  As directed   Call MD for:  persistant dizziness or light-headedness    Complete by:  As directed   Call MD for:  persistant nausea and vomiting    Complete by:  As directed   Call MD for:  severe uncontrolled pain    Complete by:  As directed   Call MD for:  temperature >100.4    Complete by:  As directed   Diet Carb Modified    Complete by:  As directed   Discharge instructions    Complete by:  As directed   CBC and basic metabolic panel in 1 week. Use BiPAP at 13/7 every night and while taking daytime naps. Avoid narcotics as much as possible.  You were cared for by a hospitalist during your hospital stay. If you have any questions about  your discharge medications or the care you received while you were in the hospital after you are discharged, you can call the unit and asked to speak with the hospitalist on call if the hospitalist that took care of you is not available. Once you are discharged, your primary care physician will handle any further medical issues. Please note that NO REFILLS for any discharge medications will be authorized once you are discharged, as it is imperative that you return to your primary care physician (or establish a relationship with a primary care physician if you do not have one) for your aftercare needs so that they can reassess your need for medications and monitor your lab values. If you do not have a primary care physician, you can call [phone] for a physician referral.   Increase activity slowly    Complete by:  As directed      ALLERGIES:  Allergies  Allergen Reactions  . Phenergan [Promethazine Hcl] Other (See Comments)    Becomes very confused and aggressive  . Haldol [Haloperidol Lactate] Other (See Comments)    Shaking   . Metformin And Related Diarrhea     Current Discharge Medication List    START taking these medications   Details  acetaminophen (TYLENOL) 325 MG tablet Take 2 tablets (650 mg total) by mouth  every 6 (six) hours as needed for mild pain or fever.    apixaban (ELIQUIS) 5 MG TABS tablet Take 1 tablet (5 mg total) by mouth 2 (two) times daily. Qty: 60 tablet, Refills: 1    cloNIDine (CATAPRES - DOSED IN MG/24 HR) 0.1 mg/24hr patch Place 1 patch (0.1 mg total) onto the skin once a week. Qty: 4 patch, Refills: 1    diltiazem (CARDIZEM CD) 240 MG 24 hr capsule Take 1 capsule (240 mg total) by mouth daily. Qty: 30 capsule, Refills: 2    feeding supplement, GLUCERNA SHAKE, (GLUCERNA SHAKE) LIQD Take 237 mLs by mouth 3 (three) times daily after meals. Refills: 0    insulin glargine (LANTUS) 100 UNIT/ML injection Inject 0.2 mLs (20 Units total) into the skin daily. Qty: 10 mL, Refills: 11    loperamide (IMODIUM) 2 MG capsule Take 1 capsule (2 mg total) by mouth 3 (three) times daily as needed for diarrhea or loose stools. Qty: 30 capsule, Refills: 0      CONTINUE these medications which have CHANGED   Details  gabapentin (NEURONTIN) 300 MG capsule Take 1 capsule (300 mg total) by mouth 2 (two) times daily. Qty: 60 capsule, Refills: 0    metoprolol tartrate (LOPRESSOR) 25 MG tablet Take 0.5 tablets (12.5 mg total) by mouth 2 (two) times daily. Qty: 60 tablet, Refills: 0    terazosin (HYTRIN) 1 MG capsule Take 1 capsule (1 mg total) by mouth at bedtime. Qty: 30 capsule, Refills: 0      CONTINUE these medications which have NOT CHANGED   Details  albuterol (PROVENTIL HFA;VENTOLIN HFA) 108 (90 BASE) MCG/ACT inhaler Inhale 2 puffs into the lungs every 6 (six) hours as needed for wheezing or shortness of breath. Qty: 1 Inhaler, Refills: 2    albuterol (PROVENTIL) (2.5 MG/3ML) 0.083% nebulizer solution Take 3 mLs (2.5 mg total) by nebulization every 6 (six) hours as needed for wheezing or shortness of breath. Qty: 75 mL, Refills: 0    cholecalciferol (VITAMIN D) 1000 UNITS tablet Take 2,000 Units by mouth daily.    Cinnamon 500 MG capsule Take 1,000 mg by mouth every morning.  citalopram (CELEXA) 20 MG tablet Take 20 mg by mouth every morning.    docusate sodium (COLACE) 100 MG capsule Take 100 mg by mouth 2 (two) times daily.    furosemide (LASIX) 40 MG tablet Take 1 tablet (40 mg total) by mouth daily. Qty: 30 tablet, Refills: 1    pantoprazole (PROTONIX) 40 MG tablet Take 40 mg by mouth 2 (two) times daily.    simvastatin (ZOCOR) 40 MG tablet Take 40 mg by mouth every evening.    triamcinolone cream (KENALOG) 0.1 % Apply 1 application topically 2 (two) times daily as needed (for irritation).       STOP taking these medications     aspirin 81 MG chewable tablet      clopidogrel (PLAVIX) 75 MG tablet      HYDROcodone-acetaminophen (NORCO/VICODIN) 5-325 MG per tablet      insulin lispro (HUMALOG) 100 UNIT/ML injection      sodium chloride 1 G tablet      traZODone (DESYREL) 150 MG tablet      ziprasidone (GEODON) 80 MG capsule      Insulin Human (INSULIN PUMP) SOLN          TOTAL DISCHARGE TIME: 35 minutes  [facility] Pager [phone]  05/29/2016, 10:27 AM

## 2016-05-28 NOTE — NC FL2 (Signed)
[place] LEVEL OF CARE SCREENING TOOL     IDENTIFICATION  Patient Name: Rick Cruz Birthdate: Apr 24, 1946 Sex: male Admission Date (Current Location): 05/17/2016  [facility] and [place] Number:  [employer] and Address:  [facility], [place] [address] [place], [place] [address]      Provider Number: [id number]  Attending Physician Name and Address:  Bonnielee Haff, MD  Relative Name and Phone Number:       Current Level of Care: Hospital Recommended Level of Care: [place] Prior Approval Number:    Date Approved/Denied:   PASRR Number: [id number]  Discharge Plan: SNF    Current Diagnoses: Patient Active Problem List   Diagnosis Date Noted  . Hypercapnemia   . Hypoglycemia 05/17/2016  . Aspiration pneumonia ([place]) 08/16/2015  . Lorazepam overdose 08/16/2015  . Obesity hypoventilation syndrome ([place]) 04/05/2015  . CSA (central sleep apnea) 04/05/2015  . Non compliance with medical treatment 04/05/2015  . Acute respiratory failure with hypoxia and hypercarbia (HCC)   . Acute on chronic diastolic CHF (congestive heart failure) ([place]) 08/06/2014  . Encephalopathy, metabolic 27/25/3664  . COPD with acute exacerbation ([place]) 08/04/2014  . COPD (chronic obstructive pulmonary disease) ([place]) 07/14/2014  . Recurrent falls 05/08/2014  . Ankle fracture, right 05/08/2014  . Ankle fracture, left 05/08/2014  . Acute respiratory failure with hypercapnia ([place]) 05/08/2014  . C. difficile colitis 01/04/2014  . Delirium 01/02/2014  . Acute encephalopathy 01/02/2014  . Morbid obesity ([place]) 06/17/2013  . Erosive esophagitis 04/22/2013  . Hyperkalemia 04/22/2013  . Chronic respiratory failure 02 dep with hypercarbia 04/22/2013  . DM (diabetes mellitus) ([place]) 04/21/2013  . COPD exacerbation ([place]) 04/18/2013  . Paranoid schizophrenia ([place]) 04/18/2013  . CAD (coronary artery disease) 04/18/2013  . Dyspnea 08/26/2008  .  DYSLIPIDEMIA 08/25/2008  . Obstructive sleep apnea 08/25/2008  . Essential hypertension 08/25/2008    Orientation RESPIRATION BLADDER Height & Weight     Self, Time, Situation, Place  O2 (4L) Incontinent Weight: 291 lb 9.6 oz (132.3 kg) Height:  6' (182.9 cm)  BEHAVIORAL SYMPTOMS/MOOD NEUROLOGICAL BOWEL NUTRITION STATUS      Incontinent Diet (Dysphagia 2 Thin Liquids)  AMBULATORY STATUS COMMUNICATION OF NEEDS Skin   Extensive Assist Verbally Normal                       Personal Care Assistance Level of Assistance  Bathing, Feeding, Dressing Bathing Assistance: Limited assistance Feeding assistance: Independent Dressing Assistance: Limited assistance     Functional Limitations Info  Sight, Hearing, Speech Sight Info: Adequate Hearing Info: Adequate Speech Info: Adequate    SPECIAL CARE FACTORS FREQUENCY  PT (By licensed PT), OT (By licensed OT), Speech therapy     PT Frequency: 3 OT Frequency: 3     Speech Therapy Frequency: 3      Contractures Contractures Info: Not present    Additional Factors Info  Code Status, Allergies, Psychotropic, Insulin Sliding Scale Code Status Info: Full Code Allergies Info: Phenergan Promethazine Hcl, Haldol Haloperidol Lactate, Metformin And Related Psychotropic Info: Celexa Insulin Sliding Scale Info: Novolog Every 4 hours       Current Medications (05/28/2016):  This is the current hospital active medication list Current Facility-Administered Medications  Medication Dose Route Frequency Provider Last Rate Last Dose  . acetaminophen (TYLENOL) suppository 650 mg  650 mg Rectal Q6H PRN Anders Simmonds, MD   650 mg at 05/22/16 1759  . albuterol (PROVENTIL) (2.5  MG/3ML) 0.083% nebulizer solution 2.5 mg  2.5 mg Nebulization Q4H PRN Bonnielee Haff, MD      . apixaban (ELIQUIS) tablet 5 mg  5 mg Oral BID Raylene Miyamoto, MD   5 mg at 05/28/16 1053  . chlorhexidine (PERIDEX) 0.12 % solution 15 mL  15 mL Mouth Rinse BID Javier Glazier, MD   15 mL at 05/28/16 0800  . citalopram (CELEXA) tablet 20 mg  20 mg Oral Daily Praveen Mannam, MD   20 mg at 05/28/16 1052  . cloNIDine (CATAPRES - Dosed in mg/24 hr) patch 0.1 mg  0.1 mg Transdermal Weekly Anders Simmonds, MD   0.1 mg at 05/22/16 1627  . diltiazem (CARDIZEM CD) 24 hr capsule 240 mg  240 mg Oral Daily Bonnielee Haff, MD   240 mg at 05/28/16 1054  . famotidine (PEPCID) tablet 20 mg  20 mg Oral BID Bonnielee Haff, MD   20 mg at 05/28/16 1053  . furosemide (LASIX) tablet 20 mg  20 mg Oral Daily Bonnielee Haff, MD   20 mg at 05/28/16 1054  . hydrALAZINE (APRESOLINE) injection 10 mg  10 mg Intravenous Q6H PRN Bonnielee Haff, MD      . insulin aspart (novoLOG) injection 0-20 Units  0-20 Units Subcutaneous Q4H Praveen Mannam, MD   4 Units at 05/28/16 0845  . insulin glargine (LANTUS) injection 20 Units  20 Units Subcutaneous Daily Raylene Miyamoto, MD   20 Units at 05/28/16 1000  . ipratropium-albuterol (DUONEB) 0.5-2.5 (3) MG/3ML nebulizer solution 3 mL  3 mL Nebulization QID Bonnielee Haff, MD   3 mL at 05/28/16 1200  . MEDLINE mouth rinse  15 mL Mouth Rinse QID Javier Glazier, MD   15 mL at 05/27/16 1600  . metoprolol tartrate (LOPRESSOR) tablet 12.5 mg  12.5 mg Oral BID Bonnielee Haff, MD   12.5 mg at 05/28/16 1054  . ondansetron (ZOFRAN) injection 4 mg  4 mg Intravenous Q6H PRN Javier Glazier, MD      . pantoprazole (PROTONIX) EC tablet 40 mg  40 mg Oral BID Bonnielee Haff, MD   40 mg at 05/28/16 1054     Discharge Medications: Please see discharge summary for a list of discharge medications.  Relevant Imaging Results:  Relevant Lab Results:   Additional Information SSN [id number]   Barbette Or, [place]

## 2016-05-29 DIAGNOSIS — E108 Type 1 diabetes mellitus with unspecified complications: Secondary | ICD-10-CM | POA: Diagnosis not present

## 2016-05-29 DIAGNOSIS — I2510 Atherosclerotic heart disease of native coronary artery without angina pectoris: Secondary | ICD-10-CM | POA: Diagnosis not present

## 2016-05-29 DIAGNOSIS — J9602 Acute respiratory failure with hypercapnia: Secondary | ICD-10-CM | POA: Diagnosis not present

## 2016-05-29 DIAGNOSIS — R4182 Altered mental status, unspecified: Secondary | ICD-10-CM | POA: Diagnosis not present

## 2016-05-29 DIAGNOSIS — E662 Morbid (severe) obesity with alveolar hypoventilation: Secondary | ICD-10-CM | POA: Diagnosis not present

## 2016-05-29 DIAGNOSIS — R1312 Dysphagia, oropharyngeal phase: Secondary | ICD-10-CM | POA: Diagnosis not present

## 2016-05-29 DIAGNOSIS — J9601 Acute respiratory failure with hypoxia: Secondary | ICD-10-CM | POA: Diagnosis not present

## 2016-05-29 DIAGNOSIS — I480 Paroxysmal atrial fibrillation: Secondary | ICD-10-CM | POA: Diagnosis not present

## 2016-05-29 DIAGNOSIS — J449 Chronic obstructive pulmonary disease, unspecified: Secondary | ICD-10-CM | POA: Diagnosis not present

## 2016-05-29 DIAGNOSIS — I5032 Chronic diastolic (congestive) heart failure: Secondary | ICD-10-CM | POA: Diagnosis not present

## 2016-05-29 DIAGNOSIS — I10 Essential (primary) hypertension: Secondary | ICD-10-CM | POA: Diagnosis not present

## 2016-05-29 DIAGNOSIS — J441 Chronic obstructive pulmonary disease with (acute) exacerbation: Secondary | ICD-10-CM | POA: Diagnosis not present

## 2016-05-29 DIAGNOSIS — J9611 Chronic respiratory failure with hypoxia: Secondary | ICD-10-CM | POA: Diagnosis not present

## 2016-05-29 DIAGNOSIS — E118 Type 2 diabetes mellitus with unspecified complications: Secondary | ICD-10-CM | POA: Diagnosis not present

## 2016-05-29 DIAGNOSIS — F200 Paranoid schizophrenia: Secondary | ICD-10-CM | POA: Diagnosis not present

## 2016-05-29 DIAGNOSIS — J9610 Chronic respiratory failure, unspecified whether with hypoxia or hypercapnia: Secondary | ICD-10-CM | POA: Diagnosis not present

## 2016-05-29 DIAGNOSIS — Z5181 Encounter for therapeutic drug level monitoring: Secondary | ICD-10-CM | POA: Diagnosis not present

## 2016-05-29 DIAGNOSIS — G9340 Encephalopathy, unspecified: Secondary | ICD-10-CM | POA: Diagnosis not present

## 2016-05-29 DIAGNOSIS — Z7409 Other reduced mobility: Secondary | ICD-10-CM | POA: Diagnosis not present

## 2016-05-29 LAB — GLUCOSE, CAPILLARY
GLUCOSE-CAPILLARY: 194 mg/dL — AB (ref 65–99)
GLUCOSE-CAPILLARY: 75 mg/dL (ref 65–99)
Glucose-Capillary: 186 mg/dL — ABNORMAL HIGH (ref 65–99)
Glucose-Capillary: 129 mg/dL — ABNORMAL HIGH (ref 65–99)

## 2016-05-29 MED ORDER — LOPERAMIDE HCL 2 MG PO CAPS: 2.0000 mg | ORAL_CAPSULE | Freq: Three times a day (TID) | ORAL | 0 refills | Status: DC | PRN

## 2016-05-29 MED ORDER — ACETAMINOPHEN 325 MG PO TABS: 650.0000 mg | ORAL_TABLET | Freq: Four times a day (QID) | ORAL | Status: DC | PRN

## 2016-05-29 MED ORDER — GLUCERNA SHAKE PO LIQD
237.0000 mL | Freq: Three times a day (TID) | ORAL | 0 refills | Status: DC
Start: 2016-05-29 — End: 2016-06-12

## 2016-05-29 NOTE — Progress Notes (Signed)
Physical Therapy Treatment Patient Details Name: Rick Cruz MRN: [id number] DOB: 1946/06/05 Today's Date: 05/29/2016    History of Present Illness 70 years old male admitted to [facility] with increased symptoms of confusion/altered mental status. Patient reportedly given overdose of insulin by his son and reportedly he was taken excessive pain medication. Currently on suicide precautions. Patient reportedly has known mental illness including depression, anxiety, bipolar mania, psychosis.. On home O2.    PT Comments    Pt admitted with above diagnosis. Pt currently with functional limitations due to balance and endurance deficits. Pt fatigues so quickly and did not want to ambulate.  Pt only got OOB to chair and was wiped out.  Will benefit from SNF with therapy.  Pt agrees. Pt will benefit from skilled PT to increase their independence and safety with mobility to allow discharge to the venue listed below.    Follow Up Recommendations  Supervision/Assistance - 24 hour;SNF     Equipment Recommendations  Other (comment) (TBD)    Recommendations for Other Services       Precautions / Restrictions Precautions Precautions: Other (comment);Fall (suicide) Restrictions Weight Bearing Restrictions: No    Mobility  Bed Mobility Overal bed mobility: Needs Assistance Bed Mobility: Supine to Sit     Supine to sit: Supervision;HOB elevated     General bed mobility comments: No physical assist required, supervision for safety, pt using momentum  Transfers Overall transfer level: Needs assistance Equipment used: Rolling walker (2 wheeled) Transfers: Sit to/from [facility] Sit to Stand: Min assist Stand pivot transfers: Min assist       General transfer comment: Pt with poor safety awareness using momentum to come to standing.  Needed cues for hand placement. Needed some assist to power up as well.  Ambulation/Gait             General Gait  Details: Refused to ambulate due to pt states "I am too tired."   Financial trader [place] (Stroke Patients Only)       Balance Overall balance assessment: Needs assistance Sitting-balance support: No upper extremity supported;Feet supported Sitting balance-Leahy Scale: Good     Standing balance support: Bilateral upper extremity supported;During functional activity Standing balance-Leahy Scale: Poor Standing balance comment: Pt needed bil UE support on RW for pivot transfer.  Cues to get squared up to chair with cues for hand placment as well.  Pt with need for assist to control descent in to chair as well. DOE after transfer 3/4 with pt very fatigued.                    Cognition Arousal/Alertness: Awake/alert Behavior During Therapy: Flat affect Overall Cognitive Status: No family/caregiver present to determine baseline cognitive functioning                      Exercises      General Comments General comments (skin integrity, edema, etc.): Encouraged pt to perform exercises however pt adamantly refused stating "I am too tired."      Pertinent Vitals/Pain Pain Assessment: No/denies pain  VSS    Home Living                      Prior Function            PT Goals (current goals can now be found in the care plan  section) Progress towards PT goals: Not progressing toward goals - comment (pt too fatigued to walk.  OOB to chair only,refused exercise)    Frequency  Min 3X/week    PT Plan Discharge plan needs to be updated    Co-evaluation             End of Session Equipment Utilized During Treatment: Gait belt;Oxygen Activity Tolerance: Patient limited by fatigue Patient left: in chair;with call bell/phone within reach;with chair alarm set     Time: 1053-1103 PT Time Calculation (min) (ACUTE ONLY): 10 min  Charges:  $Therapeutic Activity: 8-22 mins                    G CodesIrwin Brakeman F June 28, 2016, 11:54 AM [facility] [phone] (pager)

## 2016-05-29 NOTE — Clinical Social Work Note (Signed)
Clinical Social Worker facilitated patient discharge including contacting patient family and facility to confirm patient discharge plans.  Clinical information faxed to facility and family agreeable with plan.  CSW arranged ambulance transport via [facility] to [facility] .  RN to call report prior to discharge.  Clinical Social Worker will sign off for now as social work intervention is no longer needed. Please consult us again if new need arises.  Jesse Duquan Gillooly, LCSW [phone] 

## 2016-05-29 NOTE — Clinical Social Work Note (Signed)
Clinical Social Work Assessment  Patient Details  Name: Rick Cruz MRN: [id number] Date of Birth: Jul 27, 1946  Date of referral:  05/28/16               Reason for consult:  Facility Placement                Permission sought to share information with:  Family Supports Permission granted to share information::  Yes, Verbal Permission Granted  Name::     Cletis Clack  Relationship::  Spouse  Contact Information:  [phone]  Housing/Transportation Living arrangements for the past 2 months:  Single Family Home Source of Information:  Patient, Spouse, Parent Patient Interpreter Needed:  None Criminal Activity/Legal Involvement Pertinent to Current Situation/Hospitalization:  No - Comment as needed Significant Relationships:  Spouse, Parents Lives with:  Spouse Do you feel safe going back to the place where you live?  Yes Need for family participation in patient care:  Yes (Comment)  Care giving concerns:  Patient wife states that their 69 year old son was the primary caregiver to patient and he unexpectedly passed last week (same day as patient admission).  Patient and patient wife both agree that they are both grieving and in order for patient to receive best care possible, ST-SNF with plans to return home is the best discharge plan at this time.   Social Worker assessment / plan:  Visual merchandiser met with patient, patient wife, and patient mother at bedside to offer support and discuss patient needs at discharge.  Patient states that he was living at home with his wife and 80 year old son prior to admission.  Patient son is now deceased and patient with limitations regarding the amount of care she is able to provide.  Patient has been to [facility] and [facility] in the past.  Patient and patient wife both agree that if [facility] is not available they would entertain [address] and [place].  CSW initiated referral to all preference areas and plans to follow up  with patient and family regarding potential admission for today.  CSW remains available for support to patient and family.  CSW to facilitate patient discharge needs once patient is medically ready and bed available.  Employment status:  Retired Database administrator PT Recommendations:  Skilled Holiday representative, Home with Home Health Information / Referral to community resources:  Skilled Nursing Facility  Patient/Family's Response to care:  Patient and family verbalize understanding and appreciation for CSW role and support in patient care.  Patient and family agreeable with SNF placement at discharge.  Patient/Family's Understanding of and Emotional Response to Diagnosis, Current Treatment, and Prognosis:  Patient is grieving the recent loss of his son and the assistance he was able to provide him at home.  Patient states that he needs time to heal physically and emotionally prior to his return home.  Patient with good family support.  Emotional Assessment Appearance:  Appears stated age Attitude/Demeanor/Rapport:  Grieving, Inconsistent (Appropriate) Affect (typically observed):  Accepting, Appropriate, Grieving, Pleasant, Sad Orientation:  Oriented to Self, Oriented to Situation, Oriented to Place, Oriented to  Time Alcohol / Substance use:  Not Applicable Psych involvement (Current and /or in the community):  No (Comment)  Discharge Needs  Concerns to be addressed:  Grief and Loss Concerns, Discharge Planning Concerns, Adjustment to Illness Readmission within the last 30 days:  No Current discharge risk:  Physical Impairment Barriers to Discharge:  Continued Medical Work up,  Equipment Delay  Barbette Or, [place]

## 2016-05-29 NOTE — Clinical Social Work Placement (Signed)
   CLINICAL SOCIAL WORK PLACEMENT  NOTE  Date:  05/29/2016  Patient Details  Name: Rick Cruz MRN: [id number] Date of Birth: [birth year]  Clinical Social Work is seeking post-discharge placement for this patient at the [place] level of care (*CSW will initial, date and re-position this form in  chart as items are completed):  Yes   Patient/family provided with [place] Work Department's list of facilities offering this level of care within the geographic area requested by the patient (or if unable, by the patient's family).  Yes   Patient/family informed of their freedom to choose among providers that offer the needed level of care, that participate in Medicare, Medicaid or managed care program needed by the patient, have an available bed and are willing to accept the patient.  Yes   Patient/family informed of [place]'s ownership interest in [facility] and [facility], as well as of the fact that they are under no obligation to receive care at these facilities.  PASRR submitted to EDS on       PASRR number received on       Existing PASRR number confirmed on 05/28/16     FL2 transmitted to all facilities in geographic area requested by pt/family on 05/28/16     FL2 transmitted to all facilities within larger geographic area on       Patient informed that his/her managed care company has contracts with or will negotiate with certain facilities, including the following:        Yes   Patient/family informed of bed offers received.  Patient chooses bed at       Physician recommends and patient chooses bed at      Patient to be transferred to   on  .  Patient to be transferred to facility by       Patient family notified on   of transfer.  Name of family member notified:        PHYSICIAN Please sign FL2, Please prepare priority discharge summary, including medications     Additional Comment:    Barbette Or,  [place]

## 2016-05-29 NOTE — Clinical Social Work Placement (Signed)
   CLINICAL SOCIAL WORK PLACEMENT  NOTE  Date:  05/29/2016  Patient Details  Name: Rick Cruz MRN: [id number] Date of Birth: 10-Oct-1945  Clinical Social Work is seeking post-discharge placement for this patient at the [place] level of care (*CSW will initial, date and re-position this form in  chart as items are completed):  Yes   Patient/family provided with [place] Work Department's list of facilities offering this level of care within the geographic area requested by the patient (or if unable, by the patient's family).  Yes   Patient/family informed of their freedom to choose among providers that offer the needed level of care, that participate in Medicare, Medicaid or managed care program needed by the patient, have an available bed and are willing to accept the patient.  Yes   Patient/family informed of [place]'s ownership interest in [facility] and [facility], as well as of the fact that they are under no obligation to receive care at these facilities.  PASRR submitted to EDS on       PASRR number received on       Existing PASRR number confirmed on 05/28/16     FL2 transmitted to all facilities in geographic area requested by pt/family on 05/28/16     FL2 transmitted to all facilities within larger geographic area on       Patient informed that his/her managed care company has contracts with or will negotiate with certain facilities, including the following:        Yes   Patient/family informed of bed offers received.  Patient [facility] bed at [facility]     Physician recommends and patient chooses bed at      Patient to be transferred to [facility] on 05/29/16.  Patient to be transferred to facility by Ambulance     Patient family notified on 05/29/16 of transfer.  Name of family member notified:  Patient wife Rick Cruz at bedside     PHYSICIAN Please sign FL2, Please prepare  priority discharge summary, including medications     Additional Comment:    _______________________________________________Jesse Brenton [place] [phone]

## 2016-05-29 NOTE — Consult Note (Signed)
   [facility] CM Inpatient Consult   05/29/2016  Rick Cruz [birth year] [id number]  Patient screened for potential [place] Management services. Patient is eligible for [facility] Care Management services under patient's [employer] Medicare plan. Per chart review, the patient is a 70 years old male admitted with increased symptoms of confusion/altered mental status. Patient reportedly given overdose of insulin by his son and reportedly he was taking excessive pain medication. Patient reportedly has known mental illness including depression, anxiety, bipolar mania, psychosis. This is the patient's first admission in 6 months. Went by to see the patient and left a brochure with contact information at the bedside.   According to the nursing staff the patient will discharge to a skilled nursing facility likely today.  No current community care management needs at this time. On home O2. For questions contact:   Natividad Brood, RN BSN [facility] Liaison  [phone] business mobile phone Toll free office [phone]

## 2016-05-29 NOTE — Progress Notes (Signed)
Patient has orders to be discharged to SNF. IV and telemetry removed. Family at bedside. Discharge instructions and medications reviewed with patient and wife. Patient verbalizes understanding. Report called to [facility]. Patient stable and awaiting transportation via [facility].

## 2016-05-30 DIAGNOSIS — G9341 Metabolic encephalopathy: Secondary | ICD-10-CM | POA: Diagnosis not present

## 2016-05-30 DIAGNOSIS — E871 Hypo-osmolality and hyponatremia: Secondary | ICD-10-CM | POA: Diagnosis not present

## 2016-05-30 DIAGNOSIS — E1159 Type 2 diabetes mellitus with other circulatory complications: Secondary | ICD-10-CM | POA: Diagnosis not present

## 2016-05-30 DIAGNOSIS — I509 Heart failure, unspecified: Secondary | ICD-10-CM | POA: Diagnosis not present

## 2016-05-30 DIAGNOSIS — Z794 Long term (current) use of insulin: Secondary | ICD-10-CM | POA: Diagnosis not present

## 2016-05-30 DIAGNOSIS — F209 Schizophrenia, unspecified: Secondary | ICD-10-CM | POA: Diagnosis not present

## 2016-05-30 DIAGNOSIS — E784 Other hyperlipidemia: Secondary | ICD-10-CM | POA: Diagnosis not present

## 2016-05-30 DIAGNOSIS — K219 Gastro-esophageal reflux disease without esophagitis: Secondary | ICD-10-CM | POA: Diagnosis not present

## 2016-05-30 DIAGNOSIS — J449 Chronic obstructive pulmonary disease, unspecified: Secondary | ICD-10-CM | POA: Diagnosis not present

## 2016-05-30 DIAGNOSIS — E119 Type 2 diabetes mellitus without complications: Secondary | ICD-10-CM | POA: Diagnosis not present

## 2016-05-30 DIAGNOSIS — J9600 Acute respiratory failure, unspecified whether with hypoxia or hypercapnia: Secondary | ICD-10-CM | POA: Diagnosis not present

## 2016-05-30 DIAGNOSIS — J9620 Acute and chronic respiratory failure, unspecified whether with hypoxia or hypercapnia: Secondary | ICD-10-CM | POA: Diagnosis not present

## 2016-05-30 DIAGNOSIS — F329 Major depressive disorder, single episode, unspecified: Secondary | ICD-10-CM | POA: Diagnosis not present

## 2016-05-30 DIAGNOSIS — I4891 Unspecified atrial fibrillation: Secondary | ICD-10-CM | POA: Diagnosis not present

## 2016-05-30 DIAGNOSIS — G894 Chronic pain syndrome: Secondary | ICD-10-CM | POA: Diagnosis not present

## 2016-05-30 DIAGNOSIS — G4733 Obstructive sleep apnea (adult) (pediatric): Secondary | ICD-10-CM | POA: Diagnosis not present

## 2016-06-07 DIAGNOSIS — J189 Pneumonia, unspecified organism: Secondary | ICD-10-CM | POA: Diagnosis not present

## 2016-06-07 DIAGNOSIS — I5032 Chronic diastolic (congestive) heart failure: Secondary | ICD-10-CM | POA: Diagnosis not present

## 2016-06-07 DIAGNOSIS — J9600 Acute respiratory failure, unspecified whether with hypoxia or hypercapnia: Secondary | ICD-10-CM | POA: Diagnosis not present

## 2016-06-07 DIAGNOSIS — I4891 Unspecified atrial fibrillation: Secondary | ICD-10-CM | POA: Diagnosis not present

## 2016-06-11 ENCOUNTER — Inpatient Hospital Stay (HOSPITAL_COMMUNITY)
Admission: EM | Admit: 2016-06-11 | Discharge: 2016-06-15 | DRG: 640 | Disposition: A | Discharge status: Skilled Nursing Facility | Payer: PPO | Attending: Internal Medicine | Admitting: Internal Medicine

## 2016-06-11 ENCOUNTER — Encounter (HOSPITAL_COMMUNITY): Payer: Self-pay | Admitting: Emergency Medicine

## 2016-06-11 ENCOUNTER — Emergency Department (HOSPITAL_COMMUNITY): Payer: PPO

## 2016-06-11 DIAGNOSIS — Z7901 Long term (current) use of anticoagulants: Secondary | ICD-10-CM

## 2016-06-11 DIAGNOSIS — I480 Paroxysmal atrial fibrillation: Secondary | ICD-10-CM

## 2016-06-11 DIAGNOSIS — J9612 Chronic respiratory failure with hypercapnia: Secondary | ICD-10-CM

## 2016-06-11 DIAGNOSIS — F203 Undifferentiated schizophrenia: Secondary | ICD-10-CM

## 2016-06-11 DIAGNOSIS — F200 Paranoid schizophrenia: Secondary | ICD-10-CM | POA: Diagnosis not present

## 2016-06-11 DIAGNOSIS — F329 Major depressive disorder, single episode, unspecified: Secondary | ICD-10-CM | POA: Diagnosis present

## 2016-06-11 DIAGNOSIS — Z6836 Body mass index (BMI) 36.0-36.9, adult: Secondary | ICD-10-CM

## 2016-06-11 DIAGNOSIS — Z79899 Other long term (current) drug therapy: Secondary | ICD-10-CM | POA: Diagnosis not present

## 2016-06-11 DIAGNOSIS — E662 Morbid (severe) obesity with alveolar hypoventilation: Secondary | ICD-10-CM | POA: Diagnosis not present

## 2016-06-11 DIAGNOSIS — Z9119 Patient's noncompliance with other medical treatment and regimen: Secondary | ICD-10-CM | POA: Diagnosis not present

## 2016-06-11 DIAGNOSIS — I5033 Acute on chronic diastolic (congestive) heart failure: Secondary | ICD-10-CM | POA: Diagnosis present

## 2016-06-11 DIAGNOSIS — I5032 Chronic diastolic (congestive) heart failure: Secondary | ICD-10-CM | POA: Diagnosis present

## 2016-06-11 DIAGNOSIS — R2681 Unsteadiness on feet: Secondary | ICD-10-CM | POA: Diagnosis not present

## 2016-06-11 DIAGNOSIS — J9610 Chronic respiratory failure, unspecified whether with hypoxia or hypercapnia: Secondary | ICD-10-CM | POA: Diagnosis present

## 2016-06-11 DIAGNOSIS — Z9981 Dependence on supplemental oxygen: Secondary | ICD-10-CM

## 2016-06-11 DIAGNOSIS — G8929 Other chronic pain: Secondary | ICD-10-CM | POA: Diagnosis present

## 2016-06-11 DIAGNOSIS — Z8673 Personal history of transient ischemic attack (TIA), and cerebral infarction without residual deficits: Secondary | ICD-10-CM

## 2016-06-11 DIAGNOSIS — E1129 Type 2 diabetes mellitus with other diabetic kidney complication: Secondary | ICD-10-CM | POA: Diagnosis not present

## 2016-06-11 DIAGNOSIS — Z87891 Personal history of nicotine dependence: Secondary | ICD-10-CM | POA: Diagnosis not present

## 2016-06-11 DIAGNOSIS — E7800 Pure hypercholesterolemia, unspecified: Secondary | ICD-10-CM | POA: Diagnosis present

## 2016-06-11 DIAGNOSIS — I509 Heart failure, unspecified: Secondary | ICD-10-CM | POA: Diagnosis not present

## 2016-06-11 DIAGNOSIS — H5441 Blindness, right eye, normal vision left eye: Secondary | ICD-10-CM | POA: Diagnosis not present

## 2016-06-11 DIAGNOSIS — E1165 Type 2 diabetes mellitus with hyperglycemia: Secondary | ICD-10-CM | POA: Diagnosis not present

## 2016-06-11 DIAGNOSIS — G4731 Primary central sleep apnea: Secondary | ICD-10-CM

## 2016-06-11 DIAGNOSIS — G4733 Obstructive sleep apnea (adult) (pediatric): Secondary | ICD-10-CM | POA: Diagnosis not present

## 2016-06-11 DIAGNOSIS — R011 Cardiac murmur, unspecified: Secondary | ICD-10-CM | POA: Diagnosis present

## 2016-06-11 DIAGNOSIS — E876 Hypokalemia: Secondary | ICD-10-CM | POA: Diagnosis present

## 2016-06-11 DIAGNOSIS — I110 Hypertensive heart disease with heart failure: Secondary | ICD-10-CM | POA: Diagnosis present

## 2016-06-11 DIAGNOSIS — IMO0002 Reserved for concepts with insufficient information to code with codable children: Secondary | ICD-10-CM

## 2016-06-11 DIAGNOSIS — D638 Anemia in other chronic diseases classified elsewhere: Secondary | ICD-10-CM

## 2016-06-11 DIAGNOSIS — Z794 Long term (current) use of insulin: Secondary | ICD-10-CM | POA: Diagnosis not present

## 2016-06-11 DIAGNOSIS — E861 Hypovolemia: Secondary | ICD-10-CM | POA: Diagnosis present

## 2016-06-11 DIAGNOSIS — D649 Anemia, unspecified: Secondary | ICD-10-CM | POA: Diagnosis not present

## 2016-06-11 DIAGNOSIS — E119 Type 2 diabetes mellitus without complications: Secondary | ICD-10-CM

## 2016-06-11 DIAGNOSIS — R069 Unspecified abnormalities of breathing: Secondary | ICD-10-CM | POA: Diagnosis not present

## 2016-06-11 DIAGNOSIS — Z5181 Encounter for therapeutic drug level monitoring: Secondary | ICD-10-CM | POA: Diagnosis not present

## 2016-06-11 DIAGNOSIS — J449 Chronic obstructive pulmonary disease, unspecified: Secondary | ICD-10-CM | POA: Diagnosis not present

## 2016-06-11 DIAGNOSIS — R4182 Altered mental status, unspecified: Secondary | ICD-10-CM | POA: Diagnosis not present

## 2016-06-11 DIAGNOSIS — Z888 Allergy status to other drugs, medicaments and biological substances status: Secondary | ICD-10-CM | POA: Diagnosis not present

## 2016-06-11 DIAGNOSIS — J9611 Chronic respiratory failure with hypoxia: Secondary | ICD-10-CM | POA: Diagnosis not present

## 2016-06-11 DIAGNOSIS — E871 Hypo-osmolality and hyponatremia: Secondary | ICD-10-CM | POA: Diagnosis not present

## 2016-06-11 DIAGNOSIS — J441 Chronic obstructive pulmonary disease with (acute) exacerbation: Secondary | ICD-10-CM | POA: Diagnosis not present

## 2016-06-11 DIAGNOSIS — R0600 Dyspnea, unspecified: Secondary | ICD-10-CM

## 2016-06-11 DIAGNOSIS — E875 Hyperkalemia: Secondary | ICD-10-CM | POA: Diagnosis not present

## 2016-06-11 DIAGNOSIS — I2510 Atherosclerotic heart disease of native coronary artery without angina pectoris: Secondary | ICD-10-CM | POA: Diagnosis not present

## 2016-06-11 DIAGNOSIS — I10 Essential (primary) hypertension: Secondary | ICD-10-CM | POA: Diagnosis not present

## 2016-06-11 DIAGNOSIS — R0602 Shortness of breath: Secondary | ICD-10-CM | POA: Diagnosis not present

## 2016-06-11 DIAGNOSIS — E118 Type 2 diabetes mellitus with unspecified complications: Secondary | ICD-10-CM | POA: Diagnosis not present

## 2016-06-11 DIAGNOSIS — R262 Difficulty in walking, not elsewhere classified: Secondary | ICD-10-CM

## 2016-06-11 NOTE — Progress Notes (Signed)
Patient arrrived via EMS on CPAP, no accessory muscle use or tachypnea noted. Breath sounds were quite diminished in bases, but clear. Removed CPAP and placed patient on 4L nasal cannula. Patient is tolerating this well and states that he feels better than he did initially. RT will continue to monitor.

## 2016-06-11 NOTE — ED Triage Notes (Signed)
Pt presents from [facility] for resp difficulty since 23:00 tonight; EMS reports Sp02 of 81% on their arrival and patient was lethargic; pt then placed on CPAP- Sp02 brought up to 88%; pt now A&O; no meds given by EMS

## 2016-06-11 NOTE — ED Provider Notes (Signed)
[facility] DEPT Provider Note   CSN: [id number] Arrival date & time:     By signing my name below, I, Rick Cruz. Rick Cruz, attest that this documentation has been prepared under the direction and in the presence of [name], MD.  Electronically Signed: Maud Cruz. Rick Cruz, ED Scribe. 06/11/16. 11:57 PM.    History   Chief Complaint Chief Complaint  Patient presents with  . Respiratory Distress    Shortness of Breath  This is a new problem. The average episode lasts 2 hours. The problem occurs continuously.The current episode started 1 to 2 hours ago. The problem has not changed since onset.Associated medical issues include COPD and heart failure.    LEVEL 5 CAVEAT DUE TO ACUITY OF CONDITION  HPI Comments: Rick Cruz, brought in by EMS from [place] is a 70 y.o. male with a PMHx of COPD, CHF, and DM who presents to the Emergency Department complaining of constant, unchanged shortness of breath onset prior to arrival. Per EMS, pt began experiencing shortness of breath 1 hour prior to EMS arrival. Pt typically uses BiPAP at home, however, pts machine is currently broken. Pts oxygen saturation dropped down to 81% which prompted initial call out. [name] takes Lasix and Eliquis daily.  PCP: [name], MD    Past Medical History:  Diagnosis Date  . 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 CHF (congestive heart failure) (HCC)    diastolic   . Chronic pain   . COPD (chronic obstructive pulmonary disease) ([place])   . Depression   . Diabetes mellitus without complication (HCC)    insulin pump  . Hypercholesterolemia   . On home O2    3 liters  . Schizophrenia ([place])   . Sleep apnea    noncompliant with BiPAP  . Tobacco abuse     Patient Active Problem List   Diagnosis Date Noted  . Hypercapnemia   . Hypoglycemia 05/17/2016  . Aspiration pneumonia ([place]) 08/16/2015  . Lorazepam overdose 08/16/2015  . Obesity hypoventilation syndrome ([place]) 04/05/2015  . CSA  (central sleep apnea) 04/05/2015  . Non compliance with medical treatment 04/05/2015  . Acute respiratory failure with hypoxia and hypercarbia (HCC)   . Acute on chronic diastolic CHF (congestive heart failure) ([place]) 08/06/2014  . Encephalopathy, metabolic 99/83/3825  . COPD with acute exacerbation ([place]) 08/04/2014  . COPD (chronic obstructive pulmonary disease) ([place]) 07/14/2014  . Recurrent falls 05/08/2014  . Ankle fracture, right 05/08/2014  . Ankle fracture, left 05/08/2014  . Acute respiratory failure with hypercapnia ([place]) 05/08/2014  . C. difficile colitis 01/04/2014  . Delirium 01/02/2014  . Acute encephalopathy 01/02/2014  . Morbid obesity ([place]) 06/17/2013  . Erosive esophagitis 04/22/2013  . Hyperkalemia 04/22/2013  . Chronic respiratory failure 02 dep with hypercarbia 04/22/2013  . DM (diabetes mellitus) ([place]) 04/21/2013  . COPD exacerbation ([place]) 04/18/2013  . Paranoid schizophrenia ([place]) 04/18/2013  . CAD (coronary artery disease) 04/18/2013  . Dyspnea 08/26/2008  . DYSLIPIDEMIA 08/25/2008  . Obstructive sleep apnea 08/25/2008  . Essential hypertension 08/25/2008    Past Surgical History:  Procedure Laterality Date  . COLON SURGERY  March 2010   secondary to large colon polyp, final path per discharge summary notes tubulovillous adenoma.   . COLONOSCOPY  July 2011   Dr. Benson Norway: multiple polyps, internal and external hemorrhoids, diverticulosis. tubular adenoma  . ESOPHAGOGASTRODUODENOSCOPY (EGD) WITH PROPOFOL N/A 04/20/2013   Procedure: ESOPHAGOGASTRODUODENOSCOPY (EGD) WITH PROPOFOL;  Surgeon: Daneil Dolin, MD;  Location:  [facility];  Service: Endoscopy;  Laterality: N/A;  . KNEE SURGERY     X2       Home Medications    Prior to Admission medications   Medication Sig Start Date End Date Taking? Authorizing Provider  acetaminophen (TYLENOL) 325 MG tablet Take 2 tablets (650 mg total) by mouth every 6 (six) hours as needed for mild pain or fever. 05/29/16   Bonnielee Haff, MD  albuterol (PROVENTIL HFA;VENTOLIN HFA) 108 (90 BASE) MCG/ACT inhaler Inhale 2 puffs into the lungs every 6 (six) hours as needed for wheezing or shortness of breath. 05/11/14   Radene Gunning, NP  albuterol (PROVENTIL) (2.5 MG/3ML) 0.083% nebulizer solution Take 3 mLs (2.5 mg total) by nebulization every 6 (six) hours as needed for wheezing or shortness of breath. 08/21/15   Kathie Dike, MD  apixaban (ELIQUIS) 5 MG TABS tablet Take 1 tablet (5 mg total) by mouth 2 (two) times daily. 05/28/16   Bonnielee Haff, MD  cholecalciferol (VITAMIN D) 1000 UNITS tablet Take 2,000 Units by mouth daily.    Historical Provider, MD  Cinnamon 500 MG capsule Take 1,000 mg by mouth every morning.     Historical Provider, MD  citalopram (CELEXA) 20 MG tablet Take 20 mg by mouth every morning.    Historical Provider, MD  cloNIDine (CATAPRES - DOSED IN MG/24 HR) 0.1 mg/24hr patch Place 1 patch (0.1 mg total) onto the skin once a week. 05/28/16   Bonnielee Haff, MD  diltiazem (CARDIZEM CD) 240 MG 24 hr capsule Take 1 capsule (240 mg total) by mouth daily. 05/28/16   Bonnielee Haff, MD  docusate sodium (COLACE) 100 MG capsule Take 100 mg by mouth 2 (two) times daily.    Historical Provider, MD  feeding supplement, GLUCERNA SHAKE, (GLUCERNA SHAKE) LIQD Take 237 mLs by mouth 3 (three) times daily after meals. 05/29/16   Bonnielee Haff, MD  furosemide (LASIX) 40 MG tablet Take 1 tablet (40 mg total) by mouth daily. 08/10/14   Kathie Dike, MD  gabapentin (NEURONTIN) 300 MG capsule Take 1 capsule (300 mg total) by mouth 2 (two) times daily. 05/28/16   Bonnielee Haff, MD  insulin glargine (LANTUS) 100 UNIT/ML injection Inject 0.2 mLs (20 Units total) into the skin daily. 05/28/16   Bonnielee Haff, MD  loperamide (IMODIUM) 2 MG capsule Take 1 capsule (2 mg total) by mouth 3 (three) times daily as needed for diarrhea or loose stools. 05/29/16   Bonnielee Haff, MD  metoprolol tartrate (LOPRESSOR) 25 MG tablet Take 0.5  tablets (12.5 mg total) by mouth 2 (two) times daily. 05/28/16   Bonnielee Haff, MD  pantoprazole (PROTONIX) 40 MG tablet Take 40 mg by mouth 2 (two) times daily.    Historical Provider, MD  simvastatin (ZOCOR) 40 MG tablet Take 40 mg by mouth every evening.    Historical Provider, MD  terazosin (HYTRIN) 1 MG capsule Take 1 capsule (1 mg total) by mouth at bedtime. 05/28/16   Bonnielee Haff, MD  triamcinolone cream (KENALOG) 0.1 % Apply 1 application topically 2 (two) times daily as needed (for irritation).     Historical Provider, MD    Family History Family History  Problem Relation Age of Onset  . Thyroid disease Mother   . Parkinson's disease Father   . Parkinson's disease    . Colon cancer Neg Hx     Social History Social History  Substance Use Topics  . Smoking status: Former Smoker    Packs/day: 1.00  Years: 50.00    Types: Cigarettes    Quit date: 09/16/2013  . Smokeless tobacco: Never Used  . Alcohol use No     Comment: history of ETOH abuse in remote past, none in at least 10 years     Allergies   Phenergan [promethazine hcl]; Haldol [haloperidol lactate]; and Metformin and related   Review of Systems Review of Systems  Unable to perform ROS: Acuity of condition     Physical Exam Updated Vital Signs BP 119/73   Pulse 65   Temp 98.2 F (36.8 C) (Oral)   Resp 13   SpO2 95%   Physical Exam  CONSTITUTIONAL: Well developed/well nourished HEAD: Normocephalic/atraumatic EYES: EOMI/PERRL ENMT: Mucous membranes moist. CPAP mask in place NECK: supple no meningeal signs SPINE/BACK:entire spine nontender CV: S1/S2 noted, no murmurs/rubs/gallops noted LUNGS: Tachypnea noted, decreased breath sounds bilaterally  ABDOMEN: soft, nontender, obese GU:no cva tenderness NEURO: Pt is awake/alert/appropriate, moves all extremitiesx4.  No facial droop.   EXTREMITIES: pulses normal/equal, full ROM. Chronic edema noted to lower extremities bilaterally  SKIN: warm,  color normal PSYCH: Unable to assess  ED Treatments / Results   DIAGNOSTIC STUDIES: Oxygen Saturation is 98% on CPAP, Normal by my interpretation.    COORDINATION OF CARE: 11:49 PM- Will order imaging, EKG, and blood work. Discussed treatment plan with pt at bedside and pt agreed to plan.     Labs (all labs ordered are listed, but only abnormal results are displayed) Labs Reviewed  BASIC METABOLIC PANEL - Abnormal; Notable for the following:       Result Value   Sodium 118 (*)    Chloride 79 (*)    Glucose, Bld 252 (*)    BUN <5 (*)    Calcium 8.5 (*)    All other components within normal limits  CBC WITH DIFFERENTIAL/PLATELET - Abnormal; Notable for the following:    WBC 14.6 (*)    Hemoglobin 11.6 (*)    HCT 36.5 (*)    Neutro Abs 11.6 (*)    All other components within normal limits  URINALYSIS, ROUTINE W REFLEX MICROSCOPIC (NOT AT [facility]) - Abnormal; Notable for the following:    Glucose, UA 500 (*)    All other components within normal limits  I-STAT ARTERIAL BLOOD GAS, ED - Abnormal; Notable for the following:    pCO2 arterial 58.3 (*)    Bicarbonate 33.7 (*)    Acid-Base Excess 6.0 (*)    All other components within normal limits  BRAIN NATRIURETIC PEPTIDE    EKG  EKG Interpretation  Date/Time:  Monday June 11 2016 23:48:07 EDT Ventricular Rate:  60 PR Interval:    QRS Duration: 153 QT Interval:  469 QTC Calculation: 469 R Axis:   53 Text Interpretation:  Sinus or ectopic atrial rhythm Right bundle branch block Baseline wander in lead(s) V1 V3 Confirmed by Christy Gentles  MD, [place] Hogsett ([id number]) on 06/11/2016 11:59:00 PM       Radiology Dg Chest Portable 1 View  Result Date: 06/12/2016 CLINICAL DATA:  Acute onset of shortness of breath. Initial encounter. EXAM: PORTABLE CHEST 1 VIEW COMPARISON:  Chest radiograph performed 05/22/2016 FINDINGS: The lungs are well-aerated. Vascular congestion is noted. Mild bibasilar opacities may reflect mild interstitial edema.  There is no evidence of pleural effusion or pneumothorax. The cardiomediastinal silhouette is borderline enlarged. No acute osseous abnormalities are seen. IMPRESSION: Vascular congestion and borderline cardiomegaly. Mild bibasilar opacities may reflect mild interstitial edema. Electronically Signed   By: Garald Balding  M.D.   On: 06/12/2016 01:13    Procedures Procedures  CRITICAL CARE Performed by: Sharyon Cable Total critical care time: 31 minutes Critical care time was exclusive of separately billable procedures and treating other patients. Critical care was necessary to treat or prevent imminent or life-threatening deterioration. Critical care was time spent personally by me on the following activities: development of treatment plan with patient and/or surrogate as well as nursing, discussions with consultants, evaluation of patient's response to treatment, examination of patient, obtaining history from patient or surrogate, ordering and performing treatments and interventions, ordering and review of laboratory studies, ordering and review of radiographic studies, pulse oximetry and re-evaluation of patient's condition. SODIUM LESS THAN 120 AND REQUIRING ADMISSION  Medications Ordered in ED Medications  ipratropium-albuterol (DUONEB) 0.5-2.5 (3) MG/3ML nebulizer solution 3 mL (3 mLs Nebulization Given 06/12/16 0028)     Initial Impression / Assessment and Plan / ED Course  I have reviewed the triage vital signs and the nursing notes.  Pertinent labs & imaging results that were available during my care of the patient were reviewed by me and considered in my medical decision making (see chart for details).  Clinical Course    Pt seen on arrival for respiratory distress, arrived on CPAP He appears improved Respiratory effort improved He is now very drowsy but easily arousable He was found to have hyponatremia Will admit 2:02 AM Pt resting but arousable He follows commands No  focal weakness in extremities noted Vitals appropriate D/w family about his condition D/w dr danford with medicine for admission for HYPOnatremia WORK OF BREATHING IMPROVED NO SIGNS OF RESP DISTRESS HE IS OFF BIPAP   Final Clinical Impressions(s) / ED Diagnoses   Final diagnoses:  Hyponatremia  Dyspnea    New Prescriptions New Prescriptions   No medications on file   I personally performed the services described in this documentation, which was scribed in my presence. The recorded information has been reviewed and is accurate.        [name], MD 06/12/16 [phone]

## 2016-06-12 ENCOUNTER — Encounter (HOSPITAL_COMMUNITY): Payer: Self-pay | Admitting: Family Medicine

## 2016-06-12 DIAGNOSIS — G4733 Obstructive sleep apnea (adult) (pediatric): Secondary | ICD-10-CM | POA: Diagnosis not present

## 2016-06-12 DIAGNOSIS — G4731 Primary central sleep apnea: Secondary | ICD-10-CM | POA: Diagnosis not present

## 2016-06-12 DIAGNOSIS — I5033 Acute on chronic diastolic (congestive) heart failure: Secondary | ICD-10-CM | POA: Diagnosis not present

## 2016-06-12 DIAGNOSIS — E662 Morbid (severe) obesity with alveolar hypoventilation: Secondary | ICD-10-CM | POA: Diagnosis not present

## 2016-06-12 DIAGNOSIS — I10 Essential (primary) hypertension: Secondary | ICD-10-CM

## 2016-06-12 DIAGNOSIS — E876 Hypokalemia: Secondary | ICD-10-CM | POA: Diagnosis not present

## 2016-06-12 DIAGNOSIS — E861 Hypovolemia: Secondary | ICD-10-CM | POA: Diagnosis not present

## 2016-06-12 DIAGNOSIS — D649 Anemia, unspecified: Secondary | ICD-10-CM | POA: Diagnosis not present

## 2016-06-12 DIAGNOSIS — R2681 Unsteadiness on feet: Secondary | ICD-10-CM | POA: Diagnosis not present

## 2016-06-12 DIAGNOSIS — I480 Paroxysmal atrial fibrillation: Secondary | ICD-10-CM | POA: Diagnosis not present

## 2016-06-12 DIAGNOSIS — I2510 Atherosclerotic heart disease of native coronary artery without angina pectoris: Secondary | ICD-10-CM | POA: Diagnosis not present

## 2016-06-12 DIAGNOSIS — E871 Hypo-osmolality and hyponatremia: Secondary | ICD-10-CM | POA: Diagnosis not present

## 2016-06-12 DIAGNOSIS — I509 Heart failure, unspecified: Secondary | ICD-10-CM | POA: Diagnosis not present

## 2016-06-12 DIAGNOSIS — F200 Paranoid schizophrenia: Secondary | ICD-10-CM | POA: Diagnosis not present

## 2016-06-12 DIAGNOSIS — Z7901 Long term (current) use of anticoagulants: Secondary | ICD-10-CM | POA: Diagnosis not present

## 2016-06-12 DIAGNOSIS — H5441 Blindness, right eye, normal vision left eye: Secondary | ICD-10-CM | POA: Diagnosis not present

## 2016-06-12 DIAGNOSIS — J441 Chronic obstructive pulmonary disease with (acute) exacerbation: Secondary | ICD-10-CM | POA: Diagnosis not present

## 2016-06-12 DIAGNOSIS — I110 Hypertensive heart disease with heart failure: Secondary | ICD-10-CM | POA: Diagnosis not present

## 2016-06-12 DIAGNOSIS — E118 Type 2 diabetes mellitus with unspecified complications: Secondary | ICD-10-CM | POA: Diagnosis not present

## 2016-06-12 DIAGNOSIS — Z794 Long term (current) use of insulin: Secondary | ICD-10-CM

## 2016-06-12 DIAGNOSIS — R0602 Shortness of breath: Secondary | ICD-10-CM | POA: Diagnosis not present

## 2016-06-12 DIAGNOSIS — J9611 Chronic respiratory failure with hypoxia: Secondary | ICD-10-CM

## 2016-06-12 DIAGNOSIS — R069 Unspecified abnormalities of breathing: Secondary | ICD-10-CM | POA: Diagnosis not present

## 2016-06-12 DIAGNOSIS — Z87891 Personal history of nicotine dependence: Secondary | ICD-10-CM | POA: Diagnosis not present

## 2016-06-12 DIAGNOSIS — Z6836 Body mass index (BMI) 36.0-36.9, adult: Secondary | ICD-10-CM | POA: Diagnosis not present

## 2016-06-12 DIAGNOSIS — Z5181 Encounter for therapeutic drug level monitoring: Secondary | ICD-10-CM | POA: Diagnosis not present

## 2016-06-12 DIAGNOSIS — I5032 Chronic diastolic (congestive) heart failure: Secondary | ICD-10-CM

## 2016-06-12 DIAGNOSIS — E875 Hyperkalemia: Secondary | ICD-10-CM | POA: Diagnosis not present

## 2016-06-12 DIAGNOSIS — D638 Anemia in other chronic diseases classified elsewhere: Secondary | ICD-10-CM | POA: Diagnosis not present

## 2016-06-12 DIAGNOSIS — J9612 Chronic respiratory failure with hypercapnia: Secondary | ICD-10-CM | POA: Diagnosis not present

## 2016-06-12 DIAGNOSIS — Z9119 Patient's noncompliance with other medical treatment and regimen: Secondary | ICD-10-CM | POA: Diagnosis not present

## 2016-06-12 DIAGNOSIS — Z8673 Personal history of transient ischemic attack (TIA), and cerebral infarction without residual deficits: Secondary | ICD-10-CM | POA: Diagnosis not present

## 2016-06-12 DIAGNOSIS — J449 Chronic obstructive pulmonary disease, unspecified: Secondary | ICD-10-CM | POA: Diagnosis not present

## 2016-06-12 DIAGNOSIS — Z888 Allergy status to other drugs, medicaments and biological substances status: Secondary | ICD-10-CM | POA: Diagnosis not present

## 2016-06-12 DIAGNOSIS — Z79899 Other long term (current) drug therapy: Secondary | ICD-10-CM | POA: Diagnosis not present

## 2016-06-12 DIAGNOSIS — E1129 Type 2 diabetes mellitus with other diabetic kidney complication: Secondary | ICD-10-CM | POA: Diagnosis not present

## 2016-06-12 DIAGNOSIS — R4182 Altered mental status, unspecified: Secondary | ICD-10-CM | POA: Diagnosis not present

## 2016-06-12 DIAGNOSIS — Z9981 Dependence on supplemental oxygen: Secondary | ICD-10-CM | POA: Diagnosis not present

## 2016-06-12 DIAGNOSIS — E119 Type 2 diabetes mellitus without complications: Secondary | ICD-10-CM

## 2016-06-12 DIAGNOSIS — E1165 Type 2 diabetes mellitus with hyperglycemia: Secondary | ICD-10-CM | POA: Diagnosis not present

## 2016-06-12 DIAGNOSIS — F329 Major depressive disorder, single episode, unspecified: Secondary | ICD-10-CM | POA: Diagnosis not present

## 2016-06-12 LAB — BASIC METABOLIC PANEL
ANION GAP: 7 (ref 5–15)
Anion gap: 7 (ref 5–15)
Anion gap: 8 (ref 5–15)
Anion gap: 8 (ref 5–15)
Anion gap: 9 (ref 5–15)
BUN: <5 mg/dL — ABNORMAL LOW (ref 6–20)
CALCIUM: 8.6 mg/dL — AB (ref 8.9–10.3)
CALCIUM: 8.7 mg/dL — AB (ref 8.9–10.3)
CALCIUM: 8.4 mg/dL — AB (ref 8.9–10.3)
CHLORIDE: 79 mmol/L — AB (ref 101–111)
CHLORIDE: 81 mmol/L — AB (ref 101–111)
CHLORIDE: 84 mmol/L — AB (ref 101–111)
CO2: 30 mmol/L (ref 22–32)
CO2: 31 mmol/L (ref 22–32)
CO2: 31 mmol/L (ref 22–32)
CO2: 32 mmol/L (ref 22–32)
CO2: 33 mmol/L — ABNORMAL HIGH (ref 22–32)
CREATININE: 0.76 mg/dL (ref 0.61–1.24)
CREATININE: 0.78 mg/dL (ref 0.61–1.24)
CREATININE: 0.78 mg/dL (ref 0.61–1.24)
CREATININE: 0.83 mg/dL (ref 0.61–1.24)
CREATININE: 0.86 mg/dL (ref 0.61–1.24)
Calcium: 8.4 mg/dL — ABNORMAL LOW (ref 8.9–10.3)
Calcium: 8.5 mg/dL — ABNORMAL LOW (ref 8.9–10.3)
Chloride: 82 mmol/L — ABNORMAL LOW (ref 101–111)
Chloride: 82 mmol/L — ABNORMAL LOW (ref 101–111)
GFR calc Af Amer: >60 mL/min (ref >60)
GFR calc Af Amer: >60 mL/min (ref >60)
GFR calc Af Amer: >60 mL/min (ref >60)
GFR calc Af Amer: >60 mL/min (ref >60)
GFR calc non Af Amer: >60 mL/min (ref >60)
GFR calc non Af Amer: >60 mL/min (ref >60)
GLUCOSE: 252 mg/dL — AB (ref 65–99)
Glucose, Bld: 216 mg/dL — ABNORMAL HIGH (ref 65–99)
Glucose, Bld: 275 mg/dL — ABNORMAL HIGH (ref 65–99)
Glucose, Bld: 206 mg/dL — ABNORMAL HIGH (ref 65–99)
Glucose, Bld: 195 mg/dL — ABNORMAL HIGH (ref 65–99)
POTASSIUM: 4.3 mmol/L (ref 3.5–5.1)
Potassium: 4.3 mmol/L (ref 3.5–5.1)
Potassium: 5.4 mmol/L — ABNORMAL HIGH (ref 3.5–5.1)
Potassium: 5.2 mmol/L — ABNORMAL HIGH (ref 3.5–5.1)
Potassium: 4.3 mmol/L (ref 3.5–5.1)
SODIUM: 121 mmol/L — AB (ref 135–145)
SODIUM: 120 mmol/L — AB (ref 135–145)
SODIUM: 122 mmol/L — AB (ref 135–145)
SODIUM: 123 mmol/L — AB (ref 135–145)
Sodium: 118 mmol/L — CL (ref 135–145)

## 2016-06-12 LAB — CBC
HCT: 36.9 % — ABNORMAL LOW (ref 39.0–52.0)
HEMOGLOBIN: 11.4 g/dL — AB (ref 13.0–17.0)
MCH: 26 pg (ref 26.0–34.0)
MCHC: 30.9 g/dL (ref 30.0–36.0)
MCV: 84.1 fL (ref 78.0–100.0)
PLATELETS: 163 10*3/uL (ref 150–400)
RBC: 4.39 MIL/uL (ref 4.22–5.81)
RDW: 13 % (ref 11.5–15.5)
WBC: 12.4 10*3/uL — AB (ref 4.0–10.5)

## 2016-06-12 LAB — I-STAT ARTERIAL BLOOD GAS, ED
Acid-Base Excess: 6 mmol/L — ABNORMAL HIGH (ref 0.0–2.0)
BICARBONATE: 33.7 mmol/L — AB (ref 20.0–28.0)
O2 SAT: 96 %
TCO2: 35 mmol/L (ref 0–100)
pCO2 arterial: 58.3 mmHg — ABNORMAL HIGH (ref 32.0–48.0)
pH, Arterial: 7.368 (ref 7.350–7.450)
pO2, Arterial: 83 mmHg (ref 83.0–108.0)

## 2016-06-12 LAB — CBC WITH DIFFERENTIAL/PLATELET
Basophils Absolute: 0 10*3/uL (ref 0.0–0.1)
Basophils Relative: 0 %
EOS PCT: 2 %
Eosinophils Absolute: 0.3 10*3/uL (ref 0.0–0.7)
HCT: 36.5 % — ABNORMAL LOW (ref 39.0–52.0)
Hemoglobin: 11.6 g/dL — ABNORMAL LOW (ref 13.0–17.0)
LYMPHS ABS: 1.9 10*3/uL (ref 0.7–4.0)
LYMPHS PCT: 13 %
MCH: 26.4 pg (ref 26.0–34.0)
MCHC: 31.8 g/dL (ref 30.0–36.0)
MCV: 83.1 fL (ref 78.0–100.0)
MONO ABS: 0.8 10*3/uL (ref 0.1–1.0)
MONOS PCT: 5 %
Neutro Abs: 11.6 10*3/uL — ABNORMAL HIGH (ref 1.7–7.7)
Neutrophils Relative %: 80 %
PLATELETS: 162 10*3/uL (ref 150–400)
RBC: 4.39 MIL/uL (ref 4.22–5.81)
RDW: 13.1 % (ref 11.5–15.5)
WBC: 14.6 10*3/uL — ABNORMAL HIGH (ref 4.0–10.5)

## 2016-06-12 LAB — URINALYSIS, ROUTINE W REFLEX MICROSCOPIC
Bilirubin Urine: NEGATIVE
GLUCOSE, UA: 500 mg/dL — AB
HGB URINE DIPSTICK: NEGATIVE
KETONES UR: NEGATIVE mg/dL
LEUKOCYTES UA: NEGATIVE
Nitrite: NEGATIVE
PH: 6 (ref 5.0–8.0)
PROTEIN: NEGATIVE mg/dL
Specific Gravity, Urine: 1.007 (ref 1.005–1.030)

## 2016-06-12 LAB — GLUCOSE, CAPILLARY
GLUCOSE-CAPILLARY: 235 mg/dL — AB (ref 65–99)
GLUCOSE-CAPILLARY: 242 mg/dL — AB (ref 65–99)
Glucose-Capillary: 223 mg/dL — ABNORMAL HIGH (ref 65–99)
Glucose-Capillary: 212 mg/dL — ABNORMAL HIGH (ref 65–99)

## 2016-06-12 LAB — CREATININE, URINE, RANDOM: Creatinine, Urine: 79.79 mg/dL

## 2016-06-12 LAB — SODIUM, URINE, RANDOM: Sodium, Ur: 14 mmol/L

## 2016-06-12 LAB — OSMOLALITY: Osmolality: 257 mOsm/kg — ABNORMAL LOW (ref 275–295)

## 2016-06-12 LAB — BRAIN NATRIURETIC PEPTIDE: B Natriuretic Peptide: 18 pg/mL (ref 0.0–100.0)

## 2016-06-12 LAB — OSMOLALITY, URINE: OSMOLALITY UR: 127 mosm/kg — AB (ref 300–900)

## 2016-06-12 MED ORDER — SODIUM CHLORIDE 0.9% FLUSH
3.0000 mL | Freq: Two times a day (BID) | INTRAVENOUS | Status: DC
  Administered 2016-06-13 – 2016-06-15 (×4): 3 mL via INTRAVENOUS

## 2016-06-12 MED ORDER — APIXABAN 5 MG PO TABS
5.0000 mg | ORAL_TABLET | Freq: Two times a day (BID) | ORAL | Status: DC
  Administered 2016-06-12 – 2016-06-15 (×7): 5 mg via ORAL
  Filled 2016-06-12 (×7): qty 1

## 2016-06-12 MED ORDER — ALBUTEROL SULFATE (2.5 MG/3ML) 0.083% IN NEBU
2.5000 mg | INHALATION_SOLUTION | RESPIRATORY_TRACT | Status: DC | PRN
Start: 2016-06-12 — End: 2016-06-15

## 2016-06-12 MED ORDER — INSULIN ASPART 100 UNIT/ML ~~LOC~~ SOLN
0.0000 [IU] | Freq: Three times a day (TID) | SUBCUTANEOUS | Status: DC
  Administered 2016-06-12 (×3): 5 [IU] via SUBCUTANEOUS
  Administered 2016-06-13: 4 [IU] via SUBCUTANEOUS
  Administered 2016-06-13 (×2): 5 [IU] via SUBCUTANEOUS
  Administered 2016-06-14 (×3): 3 [IU] via SUBCUTANEOUS
  Administered 2016-06-15: 8 [IU] via SUBCUTANEOUS
  Administered 2016-06-15: 2 [IU] via SUBCUTANEOUS

## 2016-06-12 MED ORDER — IPRATROPIUM-ALBUTEROL 0.5-2.5 (3) MG/3ML IN SOLN
3.0000 mL | Freq: Once | RESPIRATORY_TRACT | Status: AC
  Administered 2016-06-12: 3 mL via RESPIRATORY_TRACT
  Filled 2016-06-12: qty 3

## 2016-06-12 MED ORDER — METOPROLOL TARTRATE 12.5 MG HALF TABLET
12.5000 mg | ORAL_TABLET | Freq: Two times a day (BID) | ORAL | Status: DC
  Administered 2016-06-12 – 2016-06-15 (×7): 12.5 mg via ORAL
  Filled 2016-06-12 (×7): qty 1

## 2016-06-12 MED ORDER — SODIUM CHLORIDE 0.9 % IV SOLN
INTRAVENOUS | Status: AC
  Administered 2016-06-12: 75 mL/h via INTRAVENOUS

## 2016-06-12 MED ORDER — INSULIN DETEMIR 100 UNIT/ML ~~LOC~~ SOLN
24.0000 [IU] | Freq: Every day | SUBCUTANEOUS | Status: DC
  Administered 2016-06-12 – 2016-06-15 (×4): 24 [IU] via SUBCUTANEOUS
  Filled 2016-06-12 (×4): qty 0.24

## 2016-06-12 MED ORDER — ACETAMINOPHEN 325 MG PO TABS
650.0000 mg | ORAL_TABLET | Freq: Four times a day (QID) | ORAL | Status: DC | PRN
  Administered 2016-06-12 – 2016-06-15 (×5): 650 mg via ORAL
  Filled 2016-06-12 (×5): qty 2

## 2016-06-12 MED ORDER — PANTOPRAZOLE SODIUM 40 MG PO TBEC
40.0000 mg | DELAYED_RELEASE_TABLET | Freq: Two times a day (BID) | ORAL | Status: DC
  Administered 2016-06-12 – 2016-06-15 (×7): 40 mg via ORAL
  Filled 2016-06-12 (×7): qty 1

## 2016-06-12 MED ORDER — DILTIAZEM HCL ER COATED BEADS 240 MG PO CP24
240.0000 mg | ORAL_CAPSULE | Freq: Every day | ORAL | Status: DC
  Administered 2016-06-12: 240 mg via ORAL
  Filled 2016-06-12: qty 1

## 2016-06-12 MED ORDER — DOCUSATE SODIUM 100 MG PO CAPS
200.0000 mg | ORAL_CAPSULE | Freq: Two times a day (BID) | ORAL | Status: DC
  Administered 2016-06-12 – 2016-06-15 (×6): 200 mg via ORAL
  Filled 2016-06-12 (×7): qty 2

## 2016-06-12 MED ORDER — FUROSEMIDE 40 MG PO TABS
40.0000 mg | ORAL_TABLET | Freq: Two times a day (BID) | ORAL | Status: DC
  Administered 2016-06-12 – 2016-06-14 (×5): 40 mg via ORAL
  Filled 2016-06-12 (×5): qty 1

## 2016-06-12 MED ORDER — ATORVASTATIN CALCIUM 20 MG PO TABS
20.0000 mg | ORAL_TABLET | Freq: Every day | ORAL | Status: DC
  Administered 2016-06-12 – 2016-06-14 (×3): 20 mg via ORAL
  Filled 2016-06-12 (×3): qty 1

## 2016-06-12 MED ORDER — BISACODYL 10 MG RE SUPP
10.0000 mg | Freq: Every day | RECTAL | Status: DC
  Administered 2016-06-13 – 2016-06-14 (×2): 10 mg via RECTAL
  Filled 2016-06-12 (×3): qty 1

## 2016-06-12 MED ORDER — DILTIAZEM HCL ER COATED BEADS 120 MG PO CP24
120.0000 mg | ORAL_CAPSULE | Freq: Every day | ORAL | Status: DC
  Administered 2016-06-13 – 2016-06-15 (×3): 120 mg via ORAL
  Filled 2016-06-12 (×3): qty 1

## 2016-06-12 MED ORDER — SODIUM POLYSTYRENE SULFONATE 15 GM/60ML PO SUSP
30.0000 g | Freq: Once | ORAL | Status: AC
  Administered 2016-06-12: 30 g via ORAL
  Filled 2016-06-12: qty 120

## 2016-06-12 MED ORDER — INSULIN ASPART 100 UNIT/ML ~~LOC~~ SOLN
0.0000 [IU] | Freq: Every day | SUBCUTANEOUS | Status: DC
  Administered 2016-06-12: 2 [IU] via SUBCUTANEOUS

## 2016-06-12 MED ORDER — GABAPENTIN 300 MG PO CAPS
300.0000 mg | ORAL_CAPSULE | Freq: Two times a day (BID) | ORAL | Status: DC
Start: 2016-06-12 — End: 2016-06-15
  Administered 2016-06-12 – 2016-06-15 (×7): 300 mg via ORAL
  Filled 2016-06-12 (×7): qty 1

## 2016-06-12 MED ORDER — SIMVASTATIN 40 MG PO TABS: 40.0000 mg | ORAL_TABLET | Freq: Every evening | ORAL | Status: DC

## 2016-06-12 MED ORDER — TERAZOSIN HCL 1 MG PO CAPS
1.0000 mg | ORAL_CAPSULE | Freq: Every day | ORAL | Status: DC
  Administered 2016-06-12 – 2016-06-14 (×3): 1 mg via ORAL
  Filled 2016-06-12 (×5): qty 1

## 2016-06-12 MED ORDER — ACETAMINOPHEN 650 MG RE SUPP
650.0000 mg | Freq: Four times a day (QID) | RECTAL | Status: DC | PRN
  Filled 2016-06-12: qty 1

## 2016-06-12 NOTE — Progress Notes (Signed)
Sign out note    Rick Cruz, is a 70 y.o. male, DOB - 10-26-1945, BTD:[id number]  Patient Admitted few hours ago for hyponatremia, shortness of breath due to unclear reasons.  1. He has history of obstructive sleep apnea, OHS, central sleep apnea and uses BiPAP at night, also has chronic diastolic CHF and COPD. Exam not assistant with CHF exacerbation, his mentation is back to baseline, he is resting in the bed without any discomfort, BNP is stable, chest x-ray is inconclusive. We'll continue monitoring with supportive care and use of BiPAP when he is sleeping. Clinically in no respiratory distress whatsoever.  2. Hyponatremia urine sodium was around 20, urine of senility much less than serum hospitality, clinically appears dehydrated, he also has past history of complex hyponatremia which was multifactorial. For now gentle normal saline with frequent BMPs, renal consulted.  3. Hyperkalemia. Gentle Kayexalate and monitor. Avoid constipation.   Vitals:   06/12/16 0345 06/12/16 0358 06/12/16 0402 06/12/16 0800  BP:   127/68 125/72  Pulse: 68 69 73   Resp: 16 (!) 9 15   Temp:   98.1 F (36.7 C) 97.3 F (36.3 C)  TempSrc:      SpO2: 95% 93% 96%   Weight: 128.4 kg (283 lb 1.1 oz)     Height: 6' (1.829 m)           Data Review   Micro Results No results found for this or any previous visit (from the past 240 hour(s)).  Radiology Reports Ct Head Wo Contrast  Result Date: 05/25/2016 CLINICAL DATA:  Sudden onset of altered mental status. EXAM: CT HEAD WITHOUT CONTRAST TECHNIQUE: Contiguous axial images were obtained from the base of the skull through the vertex without intravenous contrast. COMPARISON:  08/16/2015 FINDINGS: Brain: The brain shows generalized atrophy. There chronic appearing small vessel ischemic changes of the cerebral hemispheric white matter. No sign of acute infarction, mass lesion,  hemorrhage, hydrocephalus or extra-axial collection. Vascular: There is atherosclerotic calcification of the major vessels at the base of the brain. Skull: Negative Sinuses/Orbits: Clear Other: None IMPRESSION: No acute finding by CT. Atrophy and chronic small-vessel ischemic changes of the white matter. Electronically Signed   By: Nelson Chimes M.D.   On: 05/25/2016 13:48   Dg Chest Portable 1 View  Result Date: 06/12/2016 CLINICAL DATA:  Acute onset of shortness of breath. Initial encounter. EXAM: PORTABLE CHEST 1 VIEW COMPARISON:  Chest radiograph performed 05/22/2016 FINDINGS: The lungs are well-aerated. Vascular congestion is noted. Mild bibasilar opacities may reflect mild interstitial edema. There is no evidence of pleural effusion or pneumothorax. The cardiomediastinal silhouette is borderline enlarged. No acute osseous abnormalities are seen. IMPRESSION: Vascular congestion and borderline cardiomegaly. Mild bibasilar opacities may reflect mild interstitial edema. Electronically Signed   By: Garald Balding M.D.   On: 06/12/2016 01:13   Dg Chest Port 1 View  Result Date: 05/22/2016 CLINICAL DATA:  Respiratory failure. EXAM: PORTABLE CHEST 1 VIEW COMPARISON:  05/21/2016. FINDINGS: Endotracheal tube, NG tube, right IJ line stable position. Stable cardiomegaly. Bibasilar pulmonary infiltrates and or edema noted. Slight worsening from prior exam. Small bilateral pleural effusions. Stable left apical pleural thickening noted. IMPRESSION: 1. Lines and tubes in stable position. 2. Cardiomegaly cardiomegaly with bibasilar infiltrates and or edema. Slight worsening from prior exam. Small left pleural effusion. Electronically Signed   By: Marcello Moores  Register   On: 05/22/2016 06:52   Dg Chest Port 1 View  Result Date: 05/21/2016 CLINICAL DATA:  70 year old male with a  history of acute respiratory failure EXAM: PORTABLE CHEST 1 VIEW COMPARISON:  05/20/2016, 05/19/2016, 05/18/2016 FINDINGS: Cardiomediastinal  silhouette likely unchanged though partially obscured by overlying lung and pleural disease. Left rotation of the patient significantly degrades the image. Unchanged endotracheal tube terminating approximately 5.3 cm above the carina. Unchanged right IJ central venous catheter. Calcifications of the aorta. Gastric tube unchanged, terminating out of the field of view. Similar appearance of mixed interstitial and airspace opacities of the bilateral lungs without significant improvement. Hazy opacity at the right base likely related the pleural fluid. IMPRESSION: Unchanged endotracheal tube and gastric tube compared to the prior. Unchanged right IJ central venous catheter. Unchanged appearance of the lungs with persisting mixed interstitial and airspace opacities, potentially a combination of atelectasis, edema, consolidation, and/ or pleural effusion. Aortic atherosclerosis. Signed, Dulcy Fanny. Earleen Newport, DO [facility] Radiology Electronically Signed   By: Corrie Mckusick D.O.   On: 05/21/2016 08:09   Dg Chest Port 1 View  Result Date: 05/20/2016 CLINICAL DATA:  Sepsis, acute respiratory failure EXAM: PORTABLE CHEST 1 VIEW COMPARISON:  05/19/2016 FINDINGS: Endotracheal tube terminates 3 cm above the carina. Cardiomegaly with mild interstitial edema. Suspected small left pleural effusion. No pneumothorax. Right IJ venous catheter terminates in the mid SVC. Enteric tube courses below the diaphragm. IMPRESSION: Endotracheal tube terminates 3 cm above the carina. Cardiomegaly with mild interstitial edema and suspected small left pleural effusion. Electronically Signed   By: Julian Hy M.D.   On: 05/20/2016 07:25   Dg Chest Port 1 View  Result Date: 05/19/2016 CLINICAL DATA:  Aspiration pneumonia. EXAM: PORTABLE CHEST 1 VIEW COMPARISON:  05/18/2016 FINDINGS: The endotracheal tube is 4 cm above the carina. The NG tube is coursing down the esophagus and into the stomach.  The right IJ catheter has been removed. The heart is enlarged but stable. Persistent bibasilar infiltrates. Possible small left effusion. IMPRESSION: Stable support apparatus except the right IJ catheter has been removed. Persistent bibasilar infiltrates and possible small left effusion. Electronically Signed   By: Marijo Sanes M.D.   On: 05/19/2016 07:54   Dg Chest Port 1 View  Result Date: 05/18/2016 CLINICAL DATA:  Central catheter placement.  Hypoxia. EXAM: PORTABLE CHEST 1 VIEW COMPARISON:  Study obtained earlier in the day FINDINGS: Central catheter tip is in the superior vena cava. Nasogastric tube tip and side port are below the diaphragm. Endotracheal tube tip is 3.9 cm above the carina. No pneumothorax. There is a slight degree of interstitial edema. Lungs otherwise are clear. No airspace consolidation. Heart is mildly enlarged with pulmonary vascular within normal limits. No adenopathy. There is aortic atherosclerotic calcification. IMPRESSION: Tube and catheter positions as described without pneumothorax. Cardiomegaly with mild edema. Suspect a mild degree of congestive heart failure. No airspace consolidation. There is aortic atherosclerosis. Electronically Signed   By: Lowella Grip III M.D.   On: 05/18/2016 11:56   Dg Chest Port 1 View  Result Date: 05/18/2016 CLINICAL DATA:  Intubated patient.  Acute respiratory failure. EXAM: PORTABLE CHEST 1 VIEW COMPARISON:  Chest radiograph 05/17/2016 FINDINGS: Endotracheal tube tip remains in position at the level of the clavicular heads. The enteric tube has been removed. Unchanged cardiomegaly. Moderate pulmonary edema, increased from the prior study, with decreased aeration of the right lower lobe. Small right pleural effusion. No pneumothorax. IMPRESSION: 1. Endotracheal tube tip in unchanged position at the level of the clavicles. 2. Moderate pulmonary edema, increased from prior study. 3. Small right pleural effusion. Electronically  Signed   By:  Ulyses Jarred M.D.   On: 05/18/2016 02:48   Dg Chest Portable 1 View  Result Date: 05/17/2016 CLINICAL DATA:  Status post intubation. EXAM: PORTABLE CHEST 1 VIEW COMPARISON:  05/17/16 FINDINGS: ET tube tip is above the carina. There is a nasogastric tube with tip below the field of view. Cardiac enlargement noted. Small pleural effusions are identified. Pulmonary vascular congestion. IMPRESSION: 1. Satisfactory position of ET tube with tip above the carina. 2. Mild CHF. Electronically Signed   By: Kerby Moors M.D.   On: 05/17/2016 18:31   Dg Chest Portable 1 View  Result Date: 05/17/2016 CLINICAL DATA:  Shortness of breath and altered mental status. EXAM: PORTABLE CHEST 1 VIEW COMPARISON:  02/20/2016 FINDINGS: Cardiomegaly and mild pulmonary vascular congestion noted. Mild peribronchial thickening is unchanged. The left retrocardiac region is difficult to evaluate on this study. No definite effusion or pneumothorax noted. IMPRESSION: Cardiomegaly with mild pulmonary vascular congestion. Left retrocardiac region difficult to evaluate on this study. Electronically Signed   By: Margarette Canada M.D.   On: 05/17/2016 17:48   Dg Abd Portable 1v  Result Date: 05/23/2016 CLINICAL DATA:  Nasogastric tube placement.  Abdominal distention EXAM: PORTABLE ABDOMEN - 1 VIEW COMPARISON:  May 18, 2016 FINDINGS: Nasogastric tube tip and side port are in the stomach. There is generalized bowel dilatation. No free air evident. IMPRESSION: Nasogastric tube tip and side port in stomach. Diffuse bowel dilatation. Suspect ileus, although a degree of bowel obstruction cannot be excluded. No free air evident. Electronically Signed   By: Lowella Grip III M.D.   On: 05/23/2016 11:21   Dg Abd Portable 1v  Result Date: 05/18/2016 CLINICAL DATA:  Orogastric tube placement EXAM: PORTABLE ABDOMEN - 1 VIEW COMPARISON:  None. FINDINGS: The OG tube extends into the stomach with tip in the region of the proximal to mid gastric  body. IMPRESSION: Orogastric tube extends into the stomach Electronically Signed   By: Andreas Newport M.D.   On: 05/18/2016 02:56    CBC  Recent Labs Lab 06/11/16 2353 06/12/16 0535  WBC 14.6* 12.4*  HGB 11.6* 11.4*  HCT 36.5* 36.9*  PLT 162 163  MCV 83.1 84.1  MCH 26.4 26.0  MCHC 31.8 30.9  RDW 13.1 13.0  LYMPHSABS 1.9  --   MONOABS 0.8  --   EOSABS 0.3  --   BASOSABS 0.0  --     Chemistries   Recent Labs Lab 06/11/16 2353 06/12/16 0535 06/12/16 0952  NA 118* 121* 120*  K 4.3 4.3 5.4*  CL 79* 81* 82*  CO2 31 33* 30  GLUCOSE 252* 216* 275*  BUN <5* <5* <5*  CREATININE 0.78 0.76 0.83  CALCIUM 8.5* 8.6* 8.7*   ------------------------------------------------------------------------------------------------------------------ estimated creatinine clearance is 116.3 mL/min (by C-G formula based on SCr of 0.83 mg/dL). ------------------------------------------------------------------------------------------------------------------ No results for input(s): HGBA1C in the last 72 hours. ------------------------------------------------------------------------------------------------------------------ No results for input(s): CHOL, HDL, LDLCALC, TRIG, CHOLHDL, LDLDIRECT in the last 72 hours. ------------------------------------------------------------------------------------------------------------------ No results for input(s): TSH, T4TOTAL, T3FREE, THYROIDAB in the last 72 hours.  Invalid input(s): FREET3 ------------------------------------------------------------------------------------------------------------------ No results for input(s): VITAMINB12, FOLATE, FERRITIN, TIBC, IRON, RETICCTPCT in the last 72 hours.  Coagulation profile No results for input(s): INR, PROTIME in the last 168 hours.  No results for input(s): DDIMER in the last 72 hours.  Cardiac Enzymes No results for input(s): CKMB, TROPONINI, MYOGLOBIN in the last 168 hours.  Invalid input(s):  CK ------------------------------------------------------------------------------------------------------------------ Invalid input(s): POCBNP

## 2016-06-12 NOTE — H&P (Signed)
History and Physical  Patient Name: Rick Cruz     ZYS:[id number]    DOB: 10/22/1945    DOA: 06/11/2016 PCP: [name], MD   Patient coming from: [facility]  Chief Complaint: Shortness of breath  HPI: Rick Cruz is a 70 y.o. 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  All history collected from nursing staff because patient is unable to provide independent history because of altered mental status.  Evidently, he initially complained to staff at his facility of SOB tonight after bed.  They were checking on him every fifteen minutes until they noticed he was hypoxic to the 80s and called 9-1-1.  EMS arrived, found the patient hypoxic to low 80s, improved with supplemental O2 via CPAP, and he reported feeling more comfortable.  ED course: -Afebrile, heart rate and respirations normal, BP 110/75, SpO2 90s on O2 by nasal cannula -Na 118, K 4.3, Cr 0.78, WBC 14.6K, Hgb 11.6, BNP normal, UA clear -pH 7.36, pCO2 58, pO2 83 (on supplemental O2), Bicarb 33 -The patient was given a nebulizer and TRH were asked to evaluate for hyponatremia  Of note, the patient was admitted with altered mental status and hyponatremia in April 2015.  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    Most recently, he was admitted for hypoxic and hypercarbic respiratory failure, intubated and admitted to the ICU here last month.  Was treated for pneumonia.  The respiratory failure was thought to be a  combination of pneumonia possibly, CHF exacerbation possibly and exacerbation of his chronic hypoventilation syndrome.  Was [facility] to [facility] on 9/12.       ROS: Review of Systems  Unable to perform ROS: Mental status change          Past Medical History:  Diagnosis Date  . 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 CHF (congestive heart failure) (HCC)    diastolic   . Chronic pain   . COPD (chronic obstructive pulmonary disease) ([place])   . Depression   . Diabetes mellitus without complication (HCC)    insulin pump  . Hypercholesterolemia   . On home O2    3 liters  . Schizophrenia ([place])   . Sleep apnea    noncompliant with BiPAP  . Tobacco abuse     Past Surgical History:  Procedure Laterality Date  . COLON SURGERY  March 2010   secondary to large colon polyp, final path per discharge summary notes tubulovillous adenoma.   . COLONOSCOPY  July 2011   Dr. Benson Norway: multiple polyps, internal and external hemorrhoids, diverticulosis. tubular adenoma  . ESOPHAGOGASTRODUODENOSCOPY (EGD) WITH PROPOFOL N/A 04/20/2013   Procedure: ESOPHAGOGASTRODUODENOSCOPY (EGD) WITH PROPOFOL;  Surgeon: Daneil Dolin, MD;  Location: [facility];  Service: Endoscopy;  Laterality: N/A;  . KNEE SURGERY     X2    Social History: Patient lives at [facility].  The patient walking status is not known.  He is a former smoker.    Allergies  Allergen Reactions  . Phenergan [Promethazine Hcl] Other (See Comments)  Becomes very confused and aggressive  . Haldol [Haloperidol Lactate] Other (See Comments)    Shaking   . Metformin And Related Diarrhea    Family history: family history includes Parkinson's disease in his father; Thyroid disease in his mother.  Prior to Admission medications   Medication Sig Start Date End Date Taking? Authorizing Provider  acetaminophen (TYLENOL) 325 MG tablet Take 2 tablets (650 mg total) by mouth every 6 (six) hours as needed for mild pain or  fever. 05/29/16  Yes Bonnielee Haff, MD  albuterol (PROVENTIL HFA;VENTOLIN HFA) 108 (90 BASE) MCG/ACT inhaler Inhale 2 puffs into the lungs every 6 (six) hours as needed for wheezing or shortness of breath. 05/11/14  Yes Lezlie Octave Black, NP  albuterol (PROVENTIL) (2.5 MG/3ML) 0.083% nebulizer solution Take 3 mLs (2.5 mg total) by nebulization every 6 (six) hours as needed for wheezing or shortness of breath. 08/21/15  Yes Kathie Dike, MD  apixaban (ELIQUIS) 5 MG TABS tablet Take 1 tablet (5 mg total) by mouth 2 (two) times daily. Patient taking differently: Take 5 mg by mouth daily.  05/28/16  Yes Bonnielee Haff, MD  cholecalciferol (VITAMIN D) 1000 UNITS tablet Take 2,000 Units by mouth daily.   Yes Historical Provider, MD  Cinnamon 500 MG capsule Take 1,000 mg by mouth every morning.    Yes Historical Provider, MD  citalopram (CELEXA) 20 MG tablet Take 20 mg by mouth every morning.   Yes Historical Provider, MD  cloNIDine (CATAPRES - DOSED IN MG/24 HR) 0.1 mg/24hr patch Place 1 patch (0.1 mg total) onto the skin once a week. 05/28/16  Yes Bonnielee Haff, MD  diltiazem (CARDIZEM CD) 240 MG 24 hr capsule Take 1 capsule (240 mg total) by mouth daily. 05/28/16  Yes Bonnielee Haff, MD  docusate sodium (COLACE) 100 MG capsule Take 100 mg by mouth 2 (two) times daily.   Yes Historical Provider, MD  furosemide (LASIX) 40 MG tablet Take 1 tablet (40 mg total) by mouth daily. 08/10/14  Yes Kathie Dike, MD  gabapentin (NEURONTIN) 300 MG capsule Take 1 capsule (300 mg total) by mouth 2 (two) times daily. 05/28/16  Yes Bonnielee Haff, MD  insulin detemir (LEVEMIR) 100 UNIT/ML injection Inject 24 Units into the skin daily.   Yes Historical Provider, MD  loperamide (IMODIUM) 2 MG capsule Take 1 capsule (2 mg total) by mouth 3 (three) times daily as needed for diarrhea or loose stools. 05/29/16  Yes Bonnielee Haff, MD  metoprolol tartrate (LOPRESSOR) 25 MG tablet Take 0.5 tablets (12.5 mg total) by mouth 2 (two) times  daily. 05/28/16  Yes Bonnielee Haff, MD  pantoprazole (PROTONIX) 40 MG tablet Take 40 mg by mouth 2 (two) times daily.   Yes Historical Provider, MD  simvastatin (ZOCOR) 40 MG tablet Take 40 mg by mouth every evening.   Yes Historical Provider, MD  terazosin (HYTRIN) 1 MG capsule Take 1 capsule (1 mg total) by mouth at bedtime. 05/28/16  Yes Bonnielee Haff, MD  feeding supplement, GLUCERNA SHAKE, (GLUCERNA SHAKE) LIQD Take 237 mLs by mouth 3 (three) times daily after meals. Patient not taking: Reported on 06/12/2016 05/29/16   Bonnielee Haff, MD  insulin glargine (LANTUS) 100 UNIT/ML injection Inject 0.2 mLs (20 Units total) into the skin daily. Patient not taking: Reported on 06/12/2016 05/28/16   Bonnielee Haff, MD       Physical Exam: BP 117/69   Pulse 62   Temp 98.2 F (36.8 C) (Oral)   Resp 12   SpO2 97%  General appearance: Elderly obese male, somnolent but rousable, in no respiratory distress, too sleepy to answer questions appropriately.  Eyes: Anicteric, conjunctiva pink, lids and lashes normal. Pupils appear reactive.  ENT: No nasal deformity, discharge, epistaxis.  Hearing unable to test. OP dry and tacky without lesions.   Neck: No neck masses.  Trachea midline.  No thyromegaly/tenderness. Lymph: No cervical or supraclavicular lymphadenopathy. Skin: Warm and dry.  There is red scaly rash on the face, neck, upper chest, primarily on skin folds. Cardiac: RRR, nl S1-S2, no murmurs appreciated.  Capillary refill is brisk.  No JVD.  No LE edema.  Radial and DP pulses 2+ and symmetric. Respiratory: Normal respiratory rate and rhythm.  Poor inspiratory effort.  No wheezes appreciated. Abdomen: Abdomen soft.  No TTP or guarding. No ascites, distension, hepatosplenomegaly.   MSK: No deformities or effusions.  No cyanosis or clubbing. Neuro: Unable to assess, does not follow commands. Psych: Unable to assess.     Labs on Admission:  I have personally reviewed following labs and  imaging studies: CBC:  Recent Labs Lab 06/11/16 2353  WBC 14.6*  NEUTROABS 11.6*  HGB 11.6*  HCT 36.5*  MCV 83.1  PLT 010   Basic Metabolic Panel:  Recent Labs Lab 06/11/16 2353  NA 118*  K 4.3  CL 79*  CO2 31  GLUCOSE 252*  BUN <5*  CREATININE 0.78  CALCIUM 8.5*   GFR: Estimated Creatinine Clearance: 121.5 mL/min (by C-G formula based on SCr of 0.78 mg/dL).  Liver Function Tests: No results for input(s): AST, ALT, ALKPHOS, BILITOT, PROT, ALBUMIN in the last 168 hours. No results for input(s): LIPASE, AMYLASE in the last 168 hours. No results for input(s): AMMONIA in the last 168 hours. Coagulation Profile: No results for input(s): INR, PROTIME in the last 168 hours. Cardiac Enzymes: No results for input(s): CKTOTAL, CKMB, CKMBINDEX, TROPONINI in the last 168 hours. BNP (last 3 results) No results for input(s): PROBNP in the last 8760 hours. HbA1C: No results for input(s): HGBA1C in the last 72 hours. CBG: No results for input(s): GLUCAP in the last 168 hours. Lipid Profile: No results for input(s): CHOL, HDL, LDLCALC, TRIG, CHOLHDL, LDLDIRECT in the last 72 hours. Thyroid Function Tests: No results for input(s): TSH, T4TOTAL, FREET4, T3FREE, THYROIDAB in the last 72 hours. Anemia Panel: No results for input(s): VITAMINB12, FOLATE, FERRITIN, TIBC, IRON, RETICCTPCT in the last 72 hours. Sepsis Labs: Invalid input(s): PROCALCITONIN, LACTICIDVEN No results found for this or any previous visit (from the past 240 hour(s)).       Radiological Exams on Admission: Personally reviewed CXR shows possible bibasilar opacities consistent with edema, doubt pneumonia: Dg Chest Portable 1 View  Result Date: 06/12/2016 CLINICAL DATA:  Acute onset of shortness of breath. Initial encounter. EXAM: PORTABLE CHEST 1 VIEW COMPARISON:  Chest radiograph performed 05/22/2016 FINDINGS: The lungs are well-aerated. Vascular congestion is noted. Mild bibasilar opacities may reflect mild  interstitial edema. There is no evidence of pleural effusion or pneumothorax. The cardiomediastinal silhouette is borderline enlarged. No acute osseous abnormalities are seen. IMPRESSION: Vascular congestion and borderline cardiomegaly. Mild bibasilar opacities may reflect mild interstitial edema. Electronically Signed   By: Garald Balding M.D.   On: 06/12/2016 01:13    EKG: Independently reviewed. Rate 60, QTc 469, RBBB.    Assessment/Plan 1. Hyponatremia:  Unclear cause.  Appears euvolemic or hypovolemic on exam.  On new clonidine for hard to control BP during last admission.  On citalopram chronically.  SIADH in 2015,  unknown cause.  Suspect hyponatremia >89 week old (given drop from 140 to 130 by day of discharge 2 weeks ago).  Without new symptoms, will treat conservatively for now. -Fluid restriction to 800 cc daily -Serial BMP q8hrs for now -Will hold possibly offending medications: clonidine and citalopram -Check urine sodium, osmolality   2. Sleep apnea, obesity hypoventilation with chronic respiratory failure with hypercarbia:  -Albuterol PRN -BiPAP at night or with naps  3. HTN:  -Continue diltiazem, metoprolol, terazosin -Hold clonidine, furosemide  4. Schizophrenia:  Stopped Geodon at last admission. -Hold citalopram for now given sodium  5. IDDM:  -Continue Levemir -Sliding scale corrections  6. Chronic diastolic CHF:  Appears euvolemic to hypovolemic.    7. Anemia:  Unchanged.  8. Paroxysmal Afib: Newly diagnosed in last hospitalization.  CHADS2Vasc 3.   -Continue apixaban (but increase to normal dosing BID) -Continue diltiazem and BB  9. Other medications: -Continue gabapentin -Continue PPI -Continue statin       DVT prophylaxis: N/A  Code Status: FULL  Family Communication: None present  Disposition Plan: Anticipate fluid restriction and monitor sodium.  Consults called: None Admission status: INPATIENT      Medical decision making:  Patient seen at 2:12 AM on 06/12/2016.  The patient was discussed with Dr. Christy Gentles.  What exists of the patient's chart was reviewed in depth and summarized above.  Clinical condition: requiring close electrolyte monitoring and possible need for BiPAP during the next 24hours.        Edwin Dada [facility] Pager [phone]

## 2016-06-12 NOTE — Care Management Note (Signed)
Case Management Note  Patient Details  Name: ZAIM NITTA MRN: [id number] Date of Birth: 03-29-1946  Subjective/Objective:    Adm w hyponatremia                Action/Plan:has wife, from [facility]   Expected Discharge Date:                  Expected Discharge Plan:  [place]  In-House Referral:  Clinical Social Work  Discharge planning Services     Post Acute Care Choice:    Choice offered to:     DME Arranged:    DME Agency:     HH Arranged:    [place] Agency:     Status of Service:  In process, will continue to follow  If discussed at Long Length of Stay Meetings, dates discussed:    Additional Comments:sw ref placed  Lacretia Leigh, RN 06/12/2016, 9:59 AM

## 2016-06-12 NOTE — Progress Notes (Signed)
Pt began to desat.  Pressures increased and o2 increased to compensate.  Rt will monitor.

## 2016-06-12 NOTE — Consult Note (Signed)
Reason for Consult:Hyponatremia Referring Physician: Dr. Reubin Milan is an 70 y.o. male.  HPI: 70 yr male with has Type 2 DM, CHF, COPD, Schizophrenia, OSA, tobacco abuse, chronic pain, depression, CVA R eye, now admitted with SOB, hyponatremia.  Apparently confused last evening.  Has constant thirst and drinks a lot. On multiple psychotropic meds.  Hx on low S Na in past and one time diagnosed with SIADH.  Hosp earlier this mon with resp failure.  No voiding sx Review of systems not obtained due to patient factors.   Past Medical History:  Diagnosis Date  . 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 CHF (congestive heart failure) (HCC)    diastolic   . Chronic pain   . COPD (chronic obstructive pulmonary disease) ([place])   . Depression   . Diabetes mellitus without complication (HCC)    insulin pump  . Hypercholesterolemia   . On home O2    3 liters  . Schizophrenia ([place])   . Sleep apnea    noncompliant with BiPAP  . Tobacco abuse     Past Surgical History:  Procedure Laterality Date  . COLON SURGERY  March 2010   secondary to large colon polyp, final path per discharge summary notes tubulovillous adenoma.   . COLONOSCOPY  July 2011   Dr. Benson Norway: multiple polyps, internal and external hemorrhoids, diverticulosis. tubular adenoma  . ESOPHAGOGASTRODUODENOSCOPY (EGD) WITH PROPOFOL N/A 04/20/2013   Procedure: ESOPHAGOGASTRODUODENOSCOPY (EGD) WITH PROPOFOL;  Surgeon: Daneil Dolin, MD;  Location: [facility];  Service: Endoscopy;  Laterality: N/A;  . KNEE SURGERY     X2    Family History  Problem Relation Age of Onset  . Thyroid disease Mother   . Parkinson's disease Father   . Parkinson's disease    . Colon cancer Neg Hx     Social History:  reports that he quit smoking about 2 years ago. His smoking use included Cigarettes. He has a 50.00 pack-year smoking history. He has never used smokeless tobacco. He reports that he does not drink alcohol or use drugs.  Allergies:   Allergies  Allergen Reactions  . Phenergan [Promethazine Hcl] Other (See Comments)    Becomes very confused and aggressive  . Haldol [Haloperidol Lactate] Other (See Comments)    Shaking   . Metformin And Related Diarrhea    Medications:  I have reviewed the patient's current medications. Prior to Admission:  Prescriptions Prior to Admission  Medication Sig Dispense Refill Last Dose  . acetaminophen (TYLENOL) 325 MG tablet Take 2 tablets (650 mg total) by mouth every 6 (six) hours as needed for mild pain or fever.   06/07/2016 at Unknown time  . albuterol (PROVENTIL HFA;VENTOLIN HFA) 108 (90 BASE) MCG/ACT inhaler Inhale 2 puffs into the lungs every 6 (six) hours as needed for wheezing or shortness of breath. 1 Inhaler 2 unknown  . albuterol (PROVENTIL) (2.5 MG/3ML) 0.083% nebulizer solution Take 3 mLs (2.5 mg total) by nebulization every 6 (six) hours as needed for wheezing or shortness of breath. 75 mL 0 unknown  . apixaban (ELIQUIS) 5 MG TABS tablet Take 1 tablet (5 mg total) by mouth 2 (two) times daily. (Patient taking differently: Take 5 mg by mouth daily. ) 60 tablet 1 06/11/2016 at 0900  . cholecalciferol (VITAMIN D) 1000 UNITS tablet Take 2,000 Units by mouth daily.   06/11/2016 at Unknown time  . Cinnamon 500 MG capsule Take 1,000 mg by mouth every morning.  06/11/2016 at Unknown time  . citalopram (CELEXA) 20 MG tablet Take 20 mg by mouth every morning.   06/11/2016 at Unknown time  . diltiazem (CARDIZEM CD) 240 MG 24 hr capsule Take 1 capsule (240 mg total) by mouth daily. 30 capsule 2 06/11/2016 at Unknown time  . docusate sodium (COLACE) 100 MG capsule Take 100 mg by mouth 2 (two) times daily.   06/11/2016 at Unknown time  . furosemide (LASIX) 40 MG tablet Take 1 tablet (40 mg total) by mouth daily. 30 tablet 1 06/11/2016 at Unknown time  . gabapentin (NEURONTIN) 300 MG capsule Take 1 capsule (300 mg total) by mouth 2 (two) times daily. 60 capsule 0 06/11/2016 at Unknown time  .  insulin detemir (LEVEMIR) 100 UNIT/ML injection Inject 24 Units into the skin daily.   06/11/2016 at Unknown time  . loperamide (IMODIUM) 2 MG capsule Take 1 capsule (2 mg total) by mouth 3 (three) times daily as needed for diarrhea or loose stools. 30 capsule 0 unknown  . metoprolol tartrate (LOPRESSOR) 25 MG tablet Take 0.5 tablets (12.5 mg total) by mouth 2 (two) times daily. 60 tablet 0 06/11/2016 at 1700  . pantoprazole (PROTONIX) 40 MG tablet Take 40 mg by mouth 2 (two) times daily.   06/11/2016 at Unknown time  . simvastatin (ZOCOR) 40 MG tablet Take 40 mg by mouth every evening.   06/11/2016 at Unknown time  . terazosin (HYTRIN) 1 MG capsule Take 1 capsule (1 mg total) by mouth at bedtime. 30 capsule 0 06/11/2016 at Unknown time     Results for orders placed or performed during the hospital encounter of 06/11/16 (from the past 48 hour(s))  Basic metabolic panel     Status: Abnormal   Collection Time: 06/11/16 11:53 PM  Result Value Ref Range   Sodium 118 (LL) 135 - 145 mmol/L    Comment: CRITICAL RESULT CALLED TO, READ BACK BY AND VERIFIED WITH: MIZE,S RN 06/12/2016 0046 JORDANS    Potassium 4.3 3.5 - 5.1 mmol/L   Chloride 79 (L) 101 - 111 mmol/L   CO2 31 22 - 32 mmol/L   Glucose, Bld 252 (H) 65 - 99 mg/dL   BUN <5 (L) 6 - 20 mg/dL   Creatinine, Ser 0.78 0.61 - 1.24 mg/dL   Calcium 8.5 (L) 8.9 - 10.3 mg/dL   GFR calc non Af Amer >60 >60 mL/min   GFR calc Af Amer >60 >60 mL/min    Comment: (NOTE) The eGFR has been calculated using the CKD EPI equation. This calculation has not been validated in all clinical situations. eGFR's persistently <60 mL/min signify possible Chronic Kidney Disease.    Anion gap 8 5 - 15  CBC with Differential/Platelet     Status: Abnormal   Collection Time: 06/11/16 11:53 PM  Result Value Ref Range   WBC 14.6 (H) 4.0 - 10.5 K/uL   RBC 4.39 4.22 - 5.81 MIL/uL   Hemoglobin 11.6 (L) 13.0 - 17.0 g/dL   HCT 36.5 (L) 39.0 - 52.0 %   MCV 83.1 78.0 - 100.0  fL   MCH 26.4 26.0 - 34.0 pg   MCHC 31.8 30.0 - 36.0 g/dL   RDW 13.1 11.5 - 15.5 %   Platelets 162 150 - 400 K/uL   Neutrophils Relative % 80 %   Neutro Abs 11.6 (H) 1.7 - 7.7 K/uL   Lymphocytes Relative 13 %   Lymphs Abs 1.9 0.7 - 4.0 K/uL   Monocytes Relative 5 %  Monocytes Absolute 0.8 0.1 - 1.0 K/uL   Eosinophils Relative 2 %   Eosinophils Absolute 0.3 0.0 - 0.7 K/uL   Basophils Relative 0 %   Basophils Absolute 0.0 0.0 - 0.1 K/uL  Brain natriuretic peptide     Status: None   Collection Time: 06/11/16 11:53 PM  Result Value Ref Range   B Natriuretic Peptide 18.0 0.0 - 100.0 pg/mL  I-Stat arterial blood gas, ED     Status: Abnormal   Collection Time: 06/12/16 12:03 AM  Result Value Ref Range   pH, Arterial 7.368 7.350 - 7.450   pCO2 arterial 58.3 (H) 32.0 - 48.0 mmHg   pO2, Arterial 83.0 83.0 - 108.0 mmHg   Bicarbonate 33.7 (H) 20.0 - 28.0 mmol/L   TCO2 35 0 - 100 mmol/L   O2 Saturation 96.0 %   Acid-Base Excess 6.0 (H) 0.0 - 2.0 mmol/L   Patient temperature 98.2 F    Collection site RADIAL, ALLEN'S TEST ACCEPTABLE    Drawn by RT    Sample type ARTERIAL   Urinalysis, Routine w reflex microscopic (not at [facility])     Status: Abnormal   Collection Time: 06/12/16 12:30 AM  Result Value Ref Range   Color, Urine YELLOW YELLOW   APPearance CLEAR CLEAR   Specific Gravity, Urine 1.007 1.005 - 1.030   pH 6.0 5.0 - 8.0   Glucose, UA 500 (A) NEGATIVE mg/dL   Hgb urine dipstick NEGATIVE NEGATIVE   Bilirubin Urine NEGATIVE NEGATIVE   Ketones, ur NEGATIVE NEGATIVE mg/dL   Protein, ur NEGATIVE NEGATIVE mg/dL   Nitrite NEGATIVE NEGATIVE   Leukocytes, UA NEGATIVE NEGATIVE    Comment: MICROSCOPIC NOT DONE ON URINES WITH NEGATIVE PROTEIN, BLOOD, LEUKOCYTES, NITRITE, OR GLUCOSE <1000 mg/dL.  Sodium, urine, random     Status: None   Collection Time: 06/12/16 12:30 AM  Result Value Ref Range   Sodium, Ur <10 mmol/L  Osmolality, urine     Status: Abnormal   Collection Time: 06/12/16  12:30 AM  Result Value Ref Range   Osmolality, Ur 127 (L) 300 - 900 mOsm/kg  Basic metabolic panel     Status: Abnormal   Collection Time: 06/12/16  5:35 AM  Result Value Ref Range   Sodium 121 (L) 135 - 145 mmol/L   Potassium 4.3 3.5 - 5.1 mmol/L   Chloride 81 (L) 101 - 111 mmol/L   CO2 33 (H) 22 - 32 mmol/L   Glucose, Bld 216 (H) 65 - 99 mg/dL   BUN <5 (L) 6 - 20 mg/dL   Creatinine, Ser 0.76 0.61 - 1.24 mg/dL   Calcium 8.6 (L) 8.9 - 10.3 mg/dL   GFR calc non Af Amer >60 >60 mL/min   GFR calc Af Amer >60 >60 mL/min    Comment: (NOTE) The eGFR has been calculated using the CKD EPI equation. This calculation has not been validated in all clinical situations. eGFR's persistently <60 mL/min signify possible Chronic Kidney Disease.    Anion gap 7 5 - 15  CBC     Status: Abnormal   Collection Time: 06/12/16  5:35 AM  Result Value Ref Range   WBC 12.4 (H) 4.0 - 10.5 K/uL   RBC 4.39 4.22 - 5.81 MIL/uL   Hemoglobin 11.4 (L) 13.0 - 17.0 g/dL   HCT 36.9 (L) 39.0 - 52.0 %   MCV 84.1 78.0 - 100.0 fL   MCH 26.0 26.0 - 34.0 pg   MCHC 30.9 30.0 - 36.0 g/dL   RDW  13.0 11.5 - 15.5 %   Platelets 163 150 - 400 K/uL  Osmolality     Status: Abnormal   Collection Time: 06/12/16  5:35 AM  Result Value Ref Range   Osmolality 257 (L) 275 - 295 mOsm/kg  Glucose, capillary     Status: Abnormal   Collection Time: 06/12/16  8:19 AM  Result Value Ref Range   Glucose-Capillary 235 (H) 65 - 99 mg/dL  Basic metabolic panel     Status: Abnormal   Collection Time: 06/12/16  9:52 AM  Result Value Ref Range   Sodium 120 (L) 135 - 145 mmol/L   Potassium 5.4 (H) 3.5 - 5.1 mmol/L    Comment: HEMOLYSIS AT THIS LEVEL MAY AFFECT RESULT   Chloride 82 (L) 101 - 111 mmol/L   CO2 30 22 - 32 mmol/L   Glucose, Bld 275 (H) 65 - 99 mg/dL   BUN <5 (L) 6 - 20 mg/dL   Creatinine, Ser 0.83 0.61 - 1.24 mg/dL   Calcium 8.7 (L) 8.9 - 10.3 mg/dL   GFR calc non Af Amer >60 >60 mL/min   GFR calc Af Amer >60 >60 mL/min     Comment: (NOTE) The eGFR has been calculated using the CKD EPI equation. This calculation has not been validated in all clinical situations. eGFR's persistently <60 mL/min signify possible Chronic Kidney Disease.    Anion gap 8 5 - 15    Dg Chest Portable 1 View  Result Date: 06/12/2016 CLINICAL DATA:  Acute onset of shortness of breath. Initial encounter. EXAM: PORTABLE CHEST 1 VIEW COMPARISON:  Chest radiograph performed 05/22/2016 FINDINGS: The lungs are well-aerated. Vascular congestion is noted. Mild bibasilar opacities may reflect mild interstitial edema. There is no evidence of pleural effusion or pneumothorax. The cardiomediastinal silhouette is borderline enlarged. No acute osseous abnormalities are seen. IMPRESSION: Vascular congestion and borderline cardiomegaly. Mild bibasilar opacities may reflect mild interstitial edema. Electronically Signed   By: Garald Balding M.D.   On: 06/12/2016 01:13    ROS Blood pressure 125/72, pulse 73, temperature 97.3 F (36.3 C), resp. rate 15, height 6' (1.829 m), weight 128.4 kg (283 lb 1.1 oz), SpO2 96 %. Physical Exam Physical Examination: General appearance - Ox2, cannot give accurate hx Mental status - as above Eyes - pupils equal and reactive, extraocular eye movements intact, funduscopic exam normal, discs flat and sharp Mouth - upper plate Neck - adenopathy noted PCL Lymphatics - posterior cervical nodes Chest - decreased air entry noted bilat, rhonchi noted bilat Heart - S1 and S2 normal, systolic murmur XF8/1 at apex Abdomen - obese, pos bs,soft, liver down 5 cm Extremities - cannot feel DP , Tr edema Skin - stasis changes LE  Assessment/Plan: 1 Hyponatremia  euvolemic hyponatremia , or mild hypervolemia.  Low U OSM c/w ability to excrete free water, UNa, low ??xs meds with low bp and CHF/lung dz.  First tx is NS and now add Lasix.  Glu ^ and correcting raises SNA.  Has many reasons for SIADH but not clearly present.  2 CHF lower  bp meds, add Lasix 3 Hypertension: not an issue, low bps 4. Anemia mild 5. Schizo 6 COPD severe 7 [place] 9 OSA 10 DM needs better control P NS, Lasix, lower bp meds allow bp to rise, fluid restrict  Lola Czerwonka L 06/12/2016, 12:03 PM

## 2016-06-13 ENCOUNTER — Encounter (HOSPITAL_COMMUNITY): Payer: Self-pay | Admitting: *Deleted

## 2016-06-13 LAB — GLUCOSE, CAPILLARY
GLUCOSE-CAPILLARY: 222 mg/dL — AB (ref 65–99)
Glucose-Capillary: 207 mg/dL — ABNORMAL HIGH (ref 65–99)
Glucose-Capillary: 202 mg/dL — ABNORMAL HIGH (ref 65–99)
Glucose-Capillary: 131 mg/dL — ABNORMAL HIGH (ref 65–99)

## 2016-06-13 LAB — BASIC METABOLIC PANEL
Anion gap: 7 (ref 5–15)
CHLORIDE: 84 mmol/L — AB (ref 101–111)
CO2: 34 mmol/L — AB (ref 22–32)
CREATININE: 0.83 mg/dL (ref 0.61–1.24)
Calcium: 8.4 mg/dL — ABNORMAL LOW (ref 8.9–10.3)
GFR calc Af Amer: >60 mL/min (ref >60)
GFR calc non Af Amer: >60 mL/min (ref >60)
Glucose, Bld: 170 mg/dL — ABNORMAL HIGH (ref 65–99)
Potassium: 4.1 mmol/L (ref 3.5–5.1)
Sodium: 125 mmol/L — ABNORMAL LOW (ref 135–145)

## 2016-06-13 MED ORDER — CHLORHEXIDINE GLUCONATE 0.12 % MT SOLN
15.0000 mL | Freq: Two times a day (BID) | OROMUCOSAL | Status: DC
Start: 2016-06-13 — End: 2016-06-15
  Administered 2016-06-13 – 2016-06-15 (×4): 15 mL via OROMUCOSAL
  Filled 2016-06-13 (×4): qty 15

## 2016-06-13 MED ORDER — INSULIN ASPART 100 UNIT/ML ~~LOC~~ SOLN
3.0000 [IU] | Freq: Three times a day (TID) | SUBCUTANEOUS | Status: DC
  Administered 2016-06-13 – 2016-06-14 (×4): 3 [IU] via SUBCUTANEOUS

## 2016-06-13 MED ORDER — ORAL CARE MOUTH RINSE
15.0000 mL | Freq: Two times a day (BID) | OROMUCOSAL | Status: DC
  Administered 2016-06-13 – 2016-06-15 (×4): 15 mL via OROMUCOSAL

## 2016-06-13 NOTE — Progress Notes (Signed)
Subjective: Interval History: has no complaint .  Objective: Vital signs in last 24 hours: Temp:  [97.3 F (36.3 C)-98.4 F (36.9 C)] 97.9 F (36.6 C) (09/27 0317) Pulse Rate:  [61-69] 61 (09/27 0335) Resp:  [16-23] 18 (09/27 0335) BP: (92-137)/(64-77) 137/77 (09/27 0335) SpO2:  [90 %-99 %] 99 % (09/27 0335) Weight:  [127.4 kg (280 lb 13.9 oz)] 127.4 kg (280 lb 13.9 oz) (09/27 0317) Weight change: -1 kg (-2 lb 3.3 oz)  Intake/Output from previous day: 09/26 0701 - 09/27 0700 In: 1721.3 [P.O.:920; I.V.:771.3] Out: 1750 [Urine:1750] Intake/Output this shift: No intake/output data recorded.  General appearance: cooperative, morbidly obese and pale Resp: diminished breath sounds bilaterally and rhonchi scattered Cardio: systolic murmur: holosystolic 2/6, blowing at apex GI: obese, pos bs. liver down 5 cm Extremities: edema 1-2+  Lab Results:  Recent Labs  06/11/16 2353 06/12/16 0535  WBC 14.6* 12.4*  HGB 11.6* 11.4*  HCT 36.5* 36.9*  PLT 162 163   BMET:  Recent Labs  06/12/16 2136 06/13/16 0155  NA 123* 125*  K 4.3 4.1  CL 84* 84*  CO2 32 34*  GLUCOSE 195* 170*  BUN <5* <5*  CREATININE 0.86 0.83  CALCIUM 8.4* 8.4*   No results for input(s): PTH in the last 72 hours. Iron Studies: No results for input(s): IRON, TIBC, TRANSFERRIN, FERRITIN in the last 72 hours.  Studies/Results: Dg Chest Portable 1 View  Result Date: 06/12/2016 CLINICAL DATA:  Acute onset of shortness of breath. Initial encounter. EXAM: PORTABLE CHEST 1 VIEW COMPARISON:  Chest radiograph performed 05/22/2016 FINDINGS: The lungs are well-aerated. Vascular congestion is noted. Mild bibasilar opacities may reflect mild interstitial edema. There is no evidence of pleural effusion or pneumothorax. The cardiomediastinal silhouette is borderline enlarged. No acute osseous abnormalities are seen. IMPRESSION: Vascular congestion and borderline cardiomegaly. Mild bibasilar opacities may reflect mild  interstitial edema. Electronically Signed   By: Garald Balding M.D.   On: 06/12/2016 01:13    I have reviewed the patient's current medications.  Assessment/Plan: 1 hyponatremia improving cont Lasix.  bp better on less meds facilitating water excretion 2 Schizo 3 Severe COPD 4 CHF diuresing 5 DM better control 6 Obesity 7 Anemia P Lasix, low vol ivf, fluid restrict    LOS: 1 day   Hilma Steinhilber L 06/13/2016,7:46 AM

## 2016-06-13 NOTE — Clinical Social Work Note (Signed)
Clinical Social Work Assessment  Patient Details  Name: Rick Cruz: [id number] Date of Birth: 1946-02-18  Date of referral:  06/13/16               Reason for consult:  Facility Placement                Permission sought to share information with:  Facility Sport and exercise psychologist, Family Supports Permission granted to share information::  Yes, Verbal Permission Granted  Name::     Tax adviser::  Ritta Slot  Relationship::  Spouse  Contact Information:  [phone]  Housing/Transportation Living arrangements for the past 2 months:  [place], [place] of Information:  Patient, Parent, Spouse Patient Interpreter Needed:  None Criminal Activity/Legal Involvement Pertinent to Current Situation/Hospitalization:  No - Comment as needed Significant Relationships:  Parents, Spouse Lives with:  Spouse Do you feel safe going back to the place where you live?  Yes Need for family participation in patient care:  Yes (Comment)  Care giving concerns:  CSW received consult regarding discharge planning. CSW spoke with patient, patient's spouse and mother. Patient came from [facility]. CSW to continue to follow and assist with discharge planning needs.   Social Worker assessment / plan:  CSW spoke with patient and patient's spouse concerning possibility of returning to rehab at [facility] before returning home.  Employment status:  Retired Forensic scientist:  Managed Care PT Recommendations:  Not assessed at this time Information / Referral to community resources:  [place]  Patient/Family's Response to care:  Patient and patient's spouse recognize need for rehab before returning home and are agreeable to returning to [facility].  Patient/Family's Understanding of and Emotional Response to Diagnosis, Current Treatment, and Prognosis:  Patient/family is realistic regarding therapy needs and expressed being hopeful for return to SNF  placement. Patient expressed understanding of CSW role and discharge process. No questions/concerns about plan or treatment.    Emotional Assessment Appearance:  Appears stated age Attitude/Demeanor/Rapport:  Other (Appropriate) Affect (typically observed):  Appropriate, Pleasant, Sad Orientation:  Oriented to Self, Oriented to Situation, Oriented to Place, Oriented to  Time Alcohol / Substance use:  Not Applicable Psych involvement (Current and /or in the community):  No (Comment)  Discharge Needs  Concerns to be addressed:  Care Coordination Readmission within the last 30 days:  Yes Current discharge risk:  None Barriers to Discharge:  Continued Medical Work up   [employer], [place] 06/13/2016, 8:16 AM

## 2016-06-13 NOTE — NC FL2 (Signed)
[place] LEVEL OF CARE SCREENING TOOL     IDENTIFICATION  Patient Name: Rick Cruz Birthdate: [birth year] Sex: male Admission Date (Current Location): 06/11/2016  [facility] and [place] Number:  [employer] and Address:  [facility], [place] [address] [place], [place] [address]      Provider Number: [id number]  Attending Physician Name and Address:  Cherene Altes, MD  Relative Name and Phone Number:       Current Level of Care: Hospital Recommended Level of Care: [place] Prior Approval Number:    Date Approved/Denied:   PASRR Number: [id number]  Discharge Plan: SNF    Current Diagnoses: Patient Active Problem List   Diagnosis Date Noted  . Hyponatremia 06/12/2016  . Aspiration pneumonia ([place]) 08/16/2015  . Obesity hypoventilation syndrome ([place]) 04/05/2015  . CSA (central sleep apnea) 04/05/2015  . Non compliance with medical treatment 04/05/2015  . Acute respiratory failure with hypoxia and hypercarbia (HCC)   . Chronic diastolic CHF (congestive heart failure) ([place]) 08/06/2014  . Recurrent falls 05/08/2014  . Ankle fracture, right 05/08/2014  . Ankle fracture, left 05/08/2014  . C. difficile colitis 01/04/2014  . Delirium 01/02/2014  . Acute encephalopathy 01/02/2014  . Morbid obesity ([place]) 06/17/2013  . Erosive esophagitis 04/22/2013  . Chronic respiratory failure 02 dep with hypercarbia 04/22/2013  . Type 2 diabetes mellitus without complication, with long-term current use of insulin ([place]) 04/21/2013  . Paranoid schizophrenia ([place]) 04/18/2013  . CAD (coronary artery disease) 04/18/2013  . DYSLIPIDEMIA 08/25/2008  . Obstructive sleep apnea 08/25/2008  . Essential hypertension 08/25/2008    Orientation RESPIRATION BLADDER Height & Weight     Self, Time, Situation, Place  O2 (Nasal cannula 3L) Incontinent, External catheter Weight: 127.4 kg (280 lb 13.9 oz) Height:  6' (182.9 cm)   BEHAVIORAL SYMPTOMS/MOOD NEUROLOGICAL BOWEL NUTRITION STATUS      Incontinent Diet (Please see DC Summary)  AMBULATORY STATUS COMMUNICATION OF NEEDS Skin   Extensive Assist Verbally Normal                       Personal Care Assistance Level of Assistance  Bathing, Feeding, Dressing Bathing Assistance: Limited assistance Feeding assistance: Independent Dressing Assistance: Limited assistance     Functional Limitations Info  Sight, Hearing, Speech Sight Info: Adequate Hearing Info: Impaired Speech Info: Adequate    SPECIAL CARE FACTORS FREQUENCY  PT (By licensed PT)     PT Frequency: 3              Contractures Contractures Info: Not present    Additional Factors Info  Code Status, Allergies, Insulin Sliding Scale Code Status Info: Full Allergies Info: Phenergan Promethazine Hcl, Haldol Haloperidol Lactate, Metformin And Related   Insulin Sliding Scale Info: insulin aspart (novoLOG) injection 0-15 Units;insulin aspart (novoLOG) injection 0-5 Units       Current Medications (06/13/2016):  This is the current hospital active medication list Current Facility-Administered Medications  Medication Dose Route Frequency Provider Last Rate Last Dose  . acetaminophen (TYLENOL) tablet 650 mg  650 mg Oral Q6H PRN Edwin Dada, MD   650 mg at 06/13/16 4627   Or  . acetaminophen (TYLENOL) suppository 650 mg  650 mg Rectal Q6H PRN Edwin Dada, MD      . albuterol (PROVENTIL) (2.5 MG/3ML) 0.083% nebulizer solution 2.5 mg  2.5 mg Nebulization Q2H PRN Edwin Dada, MD      . apixaban (  ELIQUIS) tablet 5 mg  5 mg Oral BID Edwin Dada, MD   5 mg at 06/12/16 2109  . atorvastatin (LIPITOR) tablet 20 mg  20 mg Oral q1800 Edwin Dada, MD   20 mg at 06/12/16 1725  . bisacodyl (DULCOLAX) suppository 10 mg  10 mg Rectal Daily Thurnell Lose, MD      . diltiazem (CARDIZEM CD) 24 hr capsule 120 mg  120 mg Oral Daily Mauricia Area, MD       . docusate sodium (COLACE) capsule 200 mg  200 mg Oral BID Thurnell Lose, MD   200 mg at 06/12/16 2109  . furosemide (LASIX) tablet 40 mg  40 mg Oral BID Mauricia Area, MD   40 mg at 06/12/16 1725  . gabapentin (NEURONTIN) capsule 300 mg  300 mg Oral BID Edwin Dada, MD   300 mg at 06/12/16 2109  . insulin aspart (novoLOG) injection 0-15 Units  0-15 Units Subcutaneous TID WC Edwin Dada, MD   5 Units at 06/12/16 1725  . insulin aspart (novoLOG) injection 0-5 Units  0-5 Units Subcutaneous QHS Edwin Dada, MD   2 Units at 06/12/16 2110  . insulin detemir (LEVEMIR) injection 24 Units  24 Units Subcutaneous Daily Edwin Dada, MD   24 Units at 06/12/16 1009  . metoprolol tartrate (LOPRESSOR) tablet 12.5 mg  12.5 mg Oral BID Edwin Dada, MD   12.5 mg at 06/12/16 2109  . pantoprazole (PROTONIX) EC tablet 40 mg  40 mg Oral BID Edwin Dada, MD   40 mg at 06/12/16 2109  . sodium chloride flush (NS) 0.9 % injection 3 mL  3 mL Intravenous Q12H Edwin Dada, MD      . terazosin (HYTRIN) capsule 1 mg  1 mg Oral QHS Edwin Dada, MD   1 mg at 06/12/16 2109     Discharge Medications: Please see discharge summary for a list of discharge medications.  Relevant Imaging Results:  Relevant Lab Results:   Additional Information SSN [id number]  Benard Halsted, LCSWA

## 2016-06-13 NOTE — Progress Notes (Signed)
[place] TEAM 1 - Stepdown/ICU TEAM  Rick Cruz  POE:[id number] DOB: [birth year] DOA: 06/11/2016 PCP: [name], MD    Brief Narrative:  70 y.o. male with a history of OHS/OSA on BiPAP, chronic diastolic CHF, COPD with chronic hypercarbia, and schizophrenia who presented with shortness of breath and was found to be hyponatremic incidentally.  Staff at his facility noticed he was hypoxic to the 31s and called 9-1-1.  EMS found the patient hypoxic to low 80s.  Upon arrival to the ED he was noted to have a Na of 118 w/ normal crt of 0.78.  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    Subjective: The patient is asleep when I enter the room.  He is easily awakened but is slightly confused again with.  As a conversation persist he becomes more clear.  He denies chest pain shortness breath fevers chills nausea or vomiting.  I spoke with his mother at bedside who reports that he appears to be back to his baseline.  Assessment & Plan:  Hyponatremia Appears moderately volume overloaded on exam - improving with Lasix diuresis - no clear evidence of SIADH at this time - follow trend  Recent Labs Lab 06/12/16 0535 06/12/16 0952 06/12/16 1657 06/12/16 2136 06/13/16 0155  NA 121* 120* 122* 123* 125*    Sleep apnea, obesity hypoventilation with chronic respiratory failure with hypercarbia  Continue BiPAP at night/with naps  HTN  Blood pressure trending upward - follow with ongoing diuresis  Schizophrenia  Stopped Geodon at last admission - follow  DM Poorly controlled at this time - adjust treatment regimen and follow  Chronic diastolic CHF Remains modestly  overloaded on exam - continue diuresis Filed Weights   06/12/16 0345 06/13/16 0317  Weight: 128.4 kg (283 lb 1.1 oz) 127.4 kg (280 lb 13.9 oz)    Chronic Anemia Hemoglobin stable  Paroxysmal Afib CHADS2Vasc is 3 - continue apixaban, diltiazem, and BB   DVT prophylaxis: Apixaban Code Status: FULL CODE Family Communication: Spoke with mother at bedside Disposition Plan: SDU  Consultants:  Nephrology   Procedures: none  Antimicrobials:  none  Objective: Blood pressure (!) 142/81, pulse 75, temperature 98.3 F (36.8 C), temperature source Oral, resp. rate 16, height 6' (1.829 m), weight 127.4 kg (280 lb 13.9 oz), SpO2 96 %.  Intake/Output Summary (Last 24 hours) at 06/13/16 1149 Last data filed at 06/13/16 0814  Gross per 24 hour  Intake             1460 ml  Output             1750 ml  Net             -290 ml   Filed Weights   06/12/16 0345 06/13/16 0317  Weight: 128.4 kg (283 lb 1.1 oz) 127.4 kg (280 lb 13.9 oz)    Examination: General: No acute respiratory distress at rest  Lungs: Clear to auscultation bilaterally without wheezes or crackles Cardiovascular: Regular rate and rhythm without murmur gallop or rub normal S1 and S2 Abdomen: Nontender, nondistended, soft, bowel sounds positive, no rebound, no ascites, no appreciable mass Extremities: No significant cyanosis or clubbing - 1+ edema bilateral lower extremities  CBC:  Recent Labs Lab 06/11/16  2353 06/12/16 0535  WBC 14.6* 12.4*  NEUTROABS 11.6*  --   HGB 11.6* 11.4*  HCT 36.5* 36.9*  MCV 83.1 84.1  PLT 162 027   Basic Metabolic Panel:  Recent Labs Lab 06/12/16 0535 06/12/16 0952 06/12/16 1657 06/12/16 2136 06/13/16 0155  NA 121* 120* 122* 123* 125*  K 4.3 5.4* 5.2* 4.3 4.1  CL 81* 82* 82* 84* 84*  CO2 33* 30 31 32 34*  GLUCOSE 216* 275* 206* 195* 170*  BUN <5* <5* <5* <5* <5*  CREATININE 0.76 0.83 0.78 0.86 0.83  CALCIUM 8.6* 8.7* 8.4* 8.4* 8.4*   GFR: Estimated Creatinine  Clearance: 115.8 mL/min (by C-G formula based on SCr of 0.83 mg/dL).  Liver Function Tests: No results for input(s): AST, ALT, ALKPHOS, BILITOT, PROT, ALBUMIN in the last 168 hours. No results for input(s): LIPASE, AMYLASE in the last 168 hours. No results for input(s): AMMONIA in the last 168 hours.  HbA1C: Hgb A1c MFr Bld  Date/Time Value Ref Range Status  05/18/2016 12:45 PM 7.4 (H) 4.8 - 5.6 % Final    Comment:    (NOTE)         Pre-diabetes: 5.7 - 6.4         Diabetes: >6.4         Glycemic control for adults with diabetes: <7.0   05/08/2014 06:09 AM 6.6 (H) <5.7 % Final    Comment:    (NOTE)                                                                       According to the ADA Clinical Practice Recommendations for 2011, when HbA1c is used as a screening test:  >=6.5%   Diagnostic of Diabetes Mellitus           (if abnormal result is confirmed) 5.7-6.4%   Increased risk of developing Diabetes Mellitus References:Diagnosis and Classification of Diabetes Mellitus,Diabetes OZDG,6440,34(VQQVZ 1):S62-S69 and Standards of Medical Care in         Diabetes - 2011,Diabetes DGLO,7564,33 (Suppl 1):S11-S61.    CBG:  Recent Labs Lab 06/12/16 0819 06/12/16 1225 06/12/16 1549 06/12/16 2109 06/13/16 0814  GLUCAP 235* 242* 223* 212* 207*    Scheduled Meds: . apixaban  5 mg Oral BID  . atorvastatin  20 mg Oral q1800  . bisacodyl  10 mg Rectal Daily  . diltiazem  120 mg Oral Daily  . docusate sodium  200 mg Oral BID  . furosemide  40 mg Oral BID  . gabapentin  300 mg Oral BID  . insulin aspart  0-15 Units Subcutaneous TID WC  . insulin aspart  0-5 Units Subcutaneous QHS  . insulin detemir  24 Units Subcutaneous Daily  . metoprolol tartrate  12.5 mg Oral BID  . pantoprazole  40 mg Oral BID  . sodium chloride flush  3 mL Intravenous Q12H  . terazosin  1 mg Oral QHS     LOS: 1 day   Cherene Altes, MD [facility] Office  [phone] Pager - Text Page  per Shea Guard as per below:  On-Call/Text Page:      [email or web address]      password [email or web address]  If 7PM-7AM, please contact night-coverage [email or web address] Password [email or web address] 06/13/2016, 11:49 AM

## 2016-06-13 NOTE — Progress Notes (Signed)
Inpatient Diabetes Program Recommendations  AACE/ADA: New Consensus Statement on Inpatient Glycemic Control (2015)  Target Ranges:  Prepandial:   less than 140 mg/dL      Peak postprandial:   less than 180 mg/dL (1-2 hours)      Critically ill patients:  140 - 180 mg/dL   Lab Results  Component Value Date   GLUCAP 212 (H) 06/12/2016   HGBA1C 7.4 (H) 05/18/2016    Review of Glycemic Control Results for Rick Cruz, Rick Cruz (MRN [id number]) as of 06/13/2016 07:02  Ref. Range 06/12/2016 08:19 06/12/2016 12:25 06/12/2016 15:49 06/12/2016 21:09  Glucose-Capillary Latest Ref Range: 65 - 99 mg/dL 235 (H) 242 (H) 223 (H) 212 (H)   Diabetes history: DM2 Outpatient Diabetes medications: Levemir 24 units daily Current orders for Inpatient glycemic control: Levemir 24 units daily + Novolog correction 0-15 units tid+ 0-5 units hs  Inpatient Diabetes Program Recommendations:   Noted elevated CBGs. Please consider adding Novolog 2-3 units meal coverage tid if eats 50%.  Thank you, Nani Gasser. Jerolene Kupfer, RN, MSN, CDE Inpatient Glycemic Control Team Team Pager [phone] (8am-5pm) 06/13/2016 7:04 AM

## 2016-06-13 NOTE — Discharge Instructions (Signed)

## 2016-06-14 DIAGNOSIS — E1165 Type 2 diabetes mellitus with hyperglycemia: Secondary | ICD-10-CM

## 2016-06-14 DIAGNOSIS — D638 Anemia in other chronic diseases classified elsewhere: Secondary | ICD-10-CM

## 2016-06-14 DIAGNOSIS — E118 Type 2 diabetes mellitus with unspecified complications: Secondary | ICD-10-CM

## 2016-06-14 DIAGNOSIS — J9612 Chronic respiratory failure with hypercapnia: Secondary | ICD-10-CM

## 2016-06-14 DIAGNOSIS — IMO0002 Reserved for concepts with insufficient information to code with codable children: Secondary | ICD-10-CM

## 2016-06-14 DIAGNOSIS — G4733 Obstructive sleep apnea (adult) (pediatric): Secondary | ICD-10-CM

## 2016-06-14 DIAGNOSIS — F203 Undifferentiated schizophrenia: Secondary | ICD-10-CM

## 2016-06-14 DIAGNOSIS — I480 Paroxysmal atrial fibrillation: Secondary | ICD-10-CM

## 2016-06-14 LAB — CBC
HEMATOCRIT: 38.1 % — AB (ref 39.0–52.0)
HEMOGLOBIN: 11.7 g/dL — AB (ref 13.0–17.0)
MCH: 26.2 pg (ref 26.0–34.0)
MCHC: 30.7 g/dL (ref 30.0–36.0)
MCV: 85.4 fL (ref 78.0–100.0)
Platelets: 154 10*3/uL (ref 150–400)
RBC: 4.46 MIL/uL (ref 4.22–5.81)
RDW: 13.4 % (ref 11.5–15.5)
WBC: 13.3 10*3/uL — AB (ref 4.0–10.5)

## 2016-06-14 LAB — BASIC METABOLIC PANEL
ANION GAP: 9 (ref 5–15)
BUN: 5 mg/dL — ABNORMAL LOW (ref 6–20)
CO2: 33 mmol/L — ABNORMAL HIGH (ref 22–32)
Calcium: 8.4 mg/dL — ABNORMAL LOW (ref 8.9–10.3)
Chloride: 84 mmol/L — ABNORMAL LOW (ref 101–111)
Creatinine, Ser: 0.86 mg/dL (ref 0.61–1.24)
GLUCOSE: 169 mg/dL — AB (ref 65–99)
POTASSIUM: 4.4 mmol/L (ref 3.5–5.1)
Sodium: 126 mmol/L — ABNORMAL LOW (ref 135–145)

## 2016-06-14 LAB — GLUCOSE, CAPILLARY
GLUCOSE-CAPILLARY: 192 mg/dL — AB (ref 65–99)
GLUCOSE-CAPILLARY: 179 mg/dL — AB (ref 65–99)
GLUCOSE-CAPILLARY: 174 mg/dL — AB (ref 65–99)
Glucose-Capillary: 192 mg/dL — ABNORMAL HIGH (ref 65–99)

## 2016-06-14 MED ORDER — INSULIN ASPART 100 UNIT/ML ~~LOC~~ SOLN
8.0000 [IU] | Freq: Three times a day (TID) | SUBCUTANEOUS | Status: DC
  Administered 2016-06-15 (×2): 8 [IU] via SUBCUTANEOUS

## 2016-06-14 MED ORDER — FUROSEMIDE 40 MG PO TABS
60.0000 mg | ORAL_TABLET | Freq: Two times a day (BID) | ORAL | Status: DC
  Administered 2016-06-14 – 2016-06-15 (×2): 60 mg via ORAL
  Filled 2016-06-14 (×3): qty 1

## 2016-06-14 NOTE — Consult Note (Addendum)
   [facility] CM Inpatient Consult   06/14/2016  Rick Cruz 1946-05-15 [id number]  Updated grammar:  Patient evaluated for re-admission and care management needs. Patient's insurance is [employer].  Patient is a 70 yr male with has Type 2 DM, HF, COPD, Schizophrenia, OSA, tobacco abuse, chronic pain, depression, CVA R eye, now admitted with SOB, hyponatremia.  Admitted from a skilled nursing facility.  Chart review of patient/wife plans to return to a skilled facility for ongoing rehab.  No community care management needs at this current time.  For questions, please contact:  Natividad Brood, RN BSN [facility] Liaison  [phone] business mobile phone Toll free office [phone]

## 2016-06-14 NOTE — Progress Notes (Signed)
PROGRESS NOTE    Rick Cruz  ZJI:[id number] DOB: Apr 04, 1946 DOA: 06/11/2016 PCP: [name], MD   Brief Narrative:  70 y.o.WM PMHx Schizophrenia, OHS/OSA on BiPAP,COPD with chronic hypercarbia, Chronic Diastolic CHF, ,  Who presented with shortness of breath and was found to be hyponatremic incidentally.  Staff at his facility noticed he was hypoxic to the 53s and called 9-1-1.  EMS found the patient hypoxic to low 80s.  Upon arrival to the ED he was noted to have a Na of 118 w/ normal crt of 0.78.  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     Subjective: 9/28 A/O 3 (does not know why). States lives at home with his wife. Ambulates using a walker. Follows all commands.      Assessment & Plan:   Principal Problem:   Hyponatremia Active Problems:   Essential hypertension   Paranoid schizophrenia (HCC)   Type 2 diabetes mellitus without complication, with long-term current use of insulin (HCC)   Chronic respiratory failure 02 dep with hypercarbia   Chronic diastolic CHF (congestive heart failure) (HCC)   Obesity hypoventilation syndrome (HCC)   CSA (central sleep apnea)   OSA (obstructive sleep apnea)   Chronic respiratory failure with hypercapnia ([place])   Undifferentiated schizophrenia ([place])   Uncontrolled type 2 diabetes mellitus with complication (HCC)   Anemia of chronic disease   Paroxysmal atrial fibrillation (HCC)   Hyponatremia -Appears moderately volume overloaded on exam - improving with Lasix diuresis  - no clear evidence of SIADH at this time - follow trend  Recent Labs Lab 06/12/16 0952 06/12/16 1657 06/12/16 2136 06/13/16 0155  06/14/16 0322  NA 120* 122* 123* 125* 126*   Sleep apnea, obesity hypoventilation with chronic respiratory failure with hypercarbia -Continue BiPAP at night/with naps  HTN -Cardizem CD 120 mg daily -Increase Lasix 60 0 mg BID -Metoprolol 12.5 mg  BID  Undifferentiated Schizophrenia -Stopped Geodon at last admission - follow  DM Type 2 uncontrolled with complication -9/1 hemoglobin A1c = 7.4  -Lantus 24 units daily -Increase NovoLog 8 units QAC -Moderate SSI  Chronic diastolic CHF -See HTN -Strict in and out since admission +814 -Daily weight Filed Weights   06/12/16 0345 06/13/16 0317 06/14/16 0345  Weight: 128.4 kg (283 lb 1.1 oz) 127.4 kg (280 lb 13.9 oz) 122.3 kg (269 lb 10 oz)   Paroxysmal Afib(CHADS2Vasc is 3)  - continue apixaban, diltiazem, and BB  Chronic Anemia Hemoglobin stable     DVT prophylaxis: Apixaban Code Status: Full Family Communication: None Disposition Plan: Discharge next 24-48 hours   Consultants:  Dr.James Deterding [facility]    Procedures/Significant Events:  None   Cultures None  Antimicrobials: None   Devices None   LINES / TUBES:      Continuous Infusions:    Objective: Vitals:   06/14/16 0928 06/14/16 1225 06/14/16 1627 06/14/16 1852  BP: 122/80 120/71 123/77 122/63  Pulse: 80 81 72 84  Resp: 19 19 20 18   Temp:  97.8 F (36.6 C) 98.1 F (36.7 C) 98.4 F (36.9 C)  TempSrc:  Oral Oral Oral  SpO2: 97% 92% 97% 98%  Weight:      Height:        Intake/Output Summary (Last 24  hours) at 06/14/16 1915 Last data filed at 06/14/16 1600  Gross per 24 hour  Intake              843 ml  Output              550 ml  Net              293 ml   Filed Weights   06/12/16 0345 06/13/16 0317 06/14/16 0345  Weight: 128.4 kg (283 lb 1.1 oz) 127.4 kg (280 lb 13.9 oz) 122.3 kg (269 lb 10 oz)    Examination:  General:A/O 3 (does not know why), follows all commands, No acute respiratory distress Eyes:  negative scleral hemorrhage, negative anisocoria, negative icterus ENT: Negative Runny nose, negative gingival bleeding, Neck:  Negative scars, masses, torticollis, lymphadenopathy, JVD Lungs: Clear to auscultation bilaterally without wheezes or crackles Cardiovascular: Regular rate and rhythm without murmur gallop or rub normal S1 and S2 Abdomen: Morbidly obese, negative abdominal pain, nondistended, positive soft, bowel sounds, no rebound, no ascites, no appreciable mass Extremities: No significant cyanosis, clubbing, or edema bilateral lower extremities Skin: Diffuse scaling of skin chronic Negative rashes, lesions, ulcers Psychiatric:  Negative depression, negative anxiety, negative fatigue, negative mania  Central nervous system:  Cranial nerves II through XII intact, tongue/uvula midline, all extremities muscle strength 5/5, sensation intact throughout, negative dysarthria, negative expressive aphasia, negative receptive aphasia.  .     Data Reviewed: Care during the described time interval was provided by me .  I have reviewed this patient's available data, including medical history, events of note, physical examination, and all test results as part of my evaluation. I have personally reviewed and interpreted all radiology studies.  CBC:  Recent Labs Lab 06/11/16 2353 06/12/16 0535 06/14/16 0322  WBC 14.6* 12.4* 13.3*  NEUTROABS 11.6*  --   --   HGB 11.6* 11.4* 11.7*  HCT 36.5* 36.9* 38.1*  MCV 83.1 84.1 85.4  PLT 162 163 195   Basic Metabolic Panel:  Recent Labs Lab 06/12/16 0952 06/12/16 1657 06/12/16 2136 06/13/16 0155 06/14/16 0322  NA 120* 122* 123* 125* 126*  K 5.4* 5.2* 4.3 4.1 4.4  CL 82* 82* 84* 84* 84*  CO2 30 31 32 34* 33*  GLUCOSE 275* 206* 195* 170* 169*  BUN <5* <5* <5* <5* 5*  CREATININE 0.83 0.78 0.86 0.83 0.86  CALCIUM 8.7* 8.4* 8.4* 8.4* 8.4*   GFR: Estimated Creatinine Clearance: 109.5 mL/min (by C-G formula based on SCr of 0.86  mg/dL). Liver Function Tests: No results for input(s): AST, ALT, ALKPHOS, BILITOT, PROT, ALBUMIN in the last 168 hours. No results for input(s): LIPASE, AMYLASE in the last 168 hours. No results for input(s): AMMONIA in the last 168 hours. Coagulation Profile: No results for input(s): INR, PROTIME in the last 168 hours. Cardiac Enzymes: No results for input(s): CKTOTAL, CKMB, CKMBINDEX, TROPONINI in the last 168 hours. BNP (last 3 results) No results for input(s): PROBNP in the last 8760 hours. HbA1C: No results for input(s): HGBA1C in the last 72 hours. CBG:  Recent Labs Lab 06/13/16 1705 06/13/16 2253 06/14/16 0732 06/14/16 1225 06/14/16 1626  GLUCAP 222* 131* 192* 192* 179*   Lipid Profile: No results for input(s): CHOL, HDL, LDLCALC, TRIG, CHOLHDL, LDLDIRECT in the last 72 hours. Thyroid Function Tests: No results for input(s): TSH, T4TOTAL, FREET4, T3FREE, THYROIDAB in the last 72 hours. Anemia Panel: No results for input(s): VITAMINB12, FOLATE, FERRITIN, TIBC, IRON, RETICCTPCT in the last 72 hours. Urine  analysis:    Component Value Date/Time   COLORURINE YELLOW 06/12/2016 0030   APPEARANCEUR CLEAR 06/12/2016 0030   LABSPEC 1.007 06/12/2016 0030   PHURINE 6.0 06/12/2016 0030   GLUCOSEU 500 (A) 06/12/2016 0030   HGBUR NEGATIVE 06/12/2016 0030   BILIRUBINUR NEGATIVE 06/12/2016 0030   KETONESUR NEGATIVE 06/12/2016 0030   PROTEINUR NEGATIVE 06/12/2016 0030   UROBILINOGEN 1.0 12/05/2014 1519   NITRITE NEGATIVE 06/12/2016 0030   LEUKOCYTESUR NEGATIVE 06/12/2016 0030   Sepsis Labs: @LABRCNTIP (procalcitonin:4,lacticidven:4)  )No results found for this or any previous visit (from the past 240 hour(s)).       Radiology Studies: No results found.      Scheduled Meds: . apixaban  5 mg Oral BID  . atorvastatin  20 mg Oral q1800  . bisacodyl  10 mg Rectal Daily  . chlorhexidine  15 mL Mouth Rinse BID  . diltiazem  120 mg Oral Daily  . docusate sodium  200  mg Oral BID  . furosemide  60 mg Oral BID  . gabapentin  300 mg Oral BID  . insulin aspart  0-15 Units Subcutaneous TID WC  . insulin aspart  0-5 Units Subcutaneous QHS  . [START ON 06/15/2016] insulin aspart  8 Units Subcutaneous TID WC  . insulin detemir  24 Units Subcutaneous Daily  . mouth rinse  15 mL Mouth Rinse q12n4p  . metoprolol tartrate  12.5 mg Oral BID  . pantoprazole  40 mg Oral BID  . sodium chloride flush  3 mL Intravenous Q12H  . terazosin  1 mg Oral QHS   Continuous Infusions:    LOS: 2 days    Time spent: 40 minutes    Ryna Beckstrom, Geraldo Docker, MD [facility] Pager [phone]   If 7PM-7AM, please contact night-coverage [email or web address] Password [facility] 06/14/2016, 7:15 PM

## 2016-06-14 NOTE — Progress Notes (Signed)
Subjective: Interval History: has no complaint . Hungry, doing better..  Objective: Vital signs in last 24 hours: Temp:  [97.2 F (36.2 C)-98.5 F (36.9 C)] 98.1 F (36.7 C) (09/28 0733) Pulse Rate:  [69-86] 84 (09/28 0733) Resp:  [13-24] 18 (09/28 0733) BP: (97-142)/(68-91) 141/81 (09/28 0733) SpO2:  [82 %-100 %] 98 % (09/28 0733) Weight:  [122.3 kg (269 lb 10 oz)] 122.3 kg (269 lb 10 oz) (09/28 0345) Weight change: -5.1 kg (-11 lb 3.9 oz)  Intake/Output from previous day: 09/27 0701 - 09/28 0700 In: 990 [P.O.:990] Out: 600 [Urine:600] Intake/Output this shift: No intake/output data recorded.  General appearance: alert, cooperative, no distress and moderately obese Resp: diminished breath sounds bilaterally Cardio: S1, S2 normal and systolic murmur: holosystolic 2/6, blowing at apex GI: obese , pos bs,  Extremities: edema 1+ pre sacral  Lab Results:  Recent Labs  06/12/16 0535 06/14/16 0322  WBC 12.4* 13.3*  HGB 11.4* 11.7*  HCT 36.9* 38.1*  PLT 163 154   BMET:  Recent Labs  06/13/16 0155 06/14/16 0322  NA 125* 126*  K 4.1 4.4  CL 84* 84*  CO2 34* 33*  GLUCOSE 170* 169*  BUN <5* 5*  CREATININE 0.83 0.86  CALCIUM 8.4* 8.4*   No results for input(s): PTH in the last 72 hours. Iron Studies: No results for input(s): IRON, TIBC, TRANSFERRIN, FERRITIN in the last 72 hours.  Studies/Results: No results found.  I have reviewed the patient's current medications.  Assessment/Plan: 1 Hyponatremia  Slowly better,  Will cont Lasix 2 Schizo 3 CHF improved 4 COPD stable P cont Lasix, SNa should cont to improve, will s/o for now.    LOS: 2 days   Dilynn Munroe L 06/14/2016,7:43 AM

## 2016-06-15 DIAGNOSIS — S82843A Displaced bimalleolar fracture of unspecified lower leg, initial encounter for closed fracture: Secondary | ICD-10-CM | POA: Diagnosis not present

## 2016-06-15 DIAGNOSIS — F200 Paranoid schizophrenia: Secondary | ICD-10-CM | POA: Diagnosis not present

## 2016-06-15 DIAGNOSIS — R4182 Altered mental status, unspecified: Secondary | ICD-10-CM | POA: Diagnosis not present

## 2016-06-15 DIAGNOSIS — I5032 Chronic diastolic (congestive) heart failure: Secondary | ICD-10-CM | POA: Diagnosis not present

## 2016-06-15 DIAGNOSIS — J9611 Chronic respiratory failure with hypoxia: Secondary | ICD-10-CM | POA: Diagnosis not present

## 2016-06-15 DIAGNOSIS — I480 Paroxysmal atrial fibrillation: Secondary | ICD-10-CM | POA: Diagnosis not present

## 2016-06-15 DIAGNOSIS — I10 Essential (primary) hypertension: Secondary | ICD-10-CM | POA: Diagnosis not present

## 2016-06-15 DIAGNOSIS — G4733 Obstructive sleep apnea (adult) (pediatric): Secondary | ICD-10-CM | POA: Diagnosis not present

## 2016-06-15 DIAGNOSIS — D638 Anemia in other chronic diseases classified elsewhere: Secondary | ICD-10-CM

## 2016-06-15 DIAGNOSIS — R2681 Unsteadiness on feet: Secondary | ICD-10-CM | POA: Diagnosis not present

## 2016-06-15 DIAGNOSIS — J441 Chronic obstructive pulmonary disease with (acute) exacerbation: Secondary | ICD-10-CM | POA: Diagnosis not present

## 2016-06-15 DIAGNOSIS — I2510 Atherosclerotic heart disease of native coronary artery without angina pectoris: Secondary | ICD-10-CM | POA: Diagnosis not present

## 2016-06-15 DIAGNOSIS — E118 Type 2 diabetes mellitus with unspecified complications: Secondary | ICD-10-CM | POA: Diagnosis not present

## 2016-06-15 DIAGNOSIS — J449 Chronic obstructive pulmonary disease, unspecified: Secondary | ICD-10-CM | POA: Diagnosis not present

## 2016-06-15 DIAGNOSIS — R278 Other lack of coordination: Secondary | ICD-10-CM | POA: Diagnosis not present

## 2016-06-15 DIAGNOSIS — E871 Hypo-osmolality and hyponatremia: Secondary | ICD-10-CM | POA: Diagnosis not present

## 2016-06-15 DIAGNOSIS — J9610 Chronic respiratory failure, unspecified whether with hypoxia or hypercapnia: Secondary | ICD-10-CM | POA: Diagnosis not present

## 2016-06-15 DIAGNOSIS — Z5181 Encounter for therapeutic drug level monitoring: Secondary | ICD-10-CM | POA: Diagnosis not present

## 2016-06-15 LAB — GLUCOSE, CAPILLARY
GLUCOSE-CAPILLARY: 266 mg/dL — AB (ref 65–99)
GLUCOSE-CAPILLARY: 138 mg/dL — AB (ref 65–99)
Glucose-Capillary: 237 mg/dL — ABNORMAL HIGH (ref 65–99)

## 2016-06-15 LAB — BASIC METABOLIC PANEL
ANION GAP: 9 (ref 5–15)
BUN: 6 mg/dL (ref 6–20)
CHLORIDE: 84 mmol/L — AB (ref 101–111)
CO2: 33 mmol/L — ABNORMAL HIGH (ref 22–32)
Calcium: 8.6 mg/dL — ABNORMAL LOW (ref 8.9–10.3)
Creatinine, Ser: 0.92 mg/dL (ref 0.61–1.24)
GFR calc non Af Amer: >60 mL/min (ref >60)
Glucose, Bld: 229 mg/dL — ABNORMAL HIGH (ref 65–99)
POTASSIUM: 4.3 mmol/L (ref 3.5–5.1)
SODIUM: 126 mmol/L — AB (ref 135–145)

## 2016-06-15 MED ORDER — DILTIAZEM HCL ER COATED BEADS 120 MG PO CP24: 120.0000 mg | ORAL_CAPSULE | Freq: Every day | ORAL | Status: DC

## 2016-06-15 MED ORDER — INSULIN ASPART 100 UNIT/ML ~~LOC~~ SOLN: 8.0000 [IU] | Freq: Three times a day (TID) | SUBCUTANEOUS | 11 refills | Status: DC

## 2016-06-15 MED ORDER — INSULIN ASPART 100 UNIT/ML ~~LOC~~ SOLN: 0.0000 [IU] | Freq: Three times a day (TID) | SUBCUTANEOUS | 11 refills | Status: DC

## 2016-06-15 MED ORDER — FUROSEMIDE 20 MG PO TABS: 60.0000 mg | ORAL_TABLET | Freq: Two times a day (BID) | ORAL | Status: DC

## 2016-06-15 MED ORDER — BISACODYL 10 MG RE SUPP: 10.0000 mg | Freq: Every day | RECTAL | 0 refills | Status: DC

## 2016-06-15 MED ORDER — APIXABAN 5 MG PO TABS: 5.0000 mg | ORAL_TABLET | Freq: Two times a day (BID) | ORAL | 1 refills | Status: DC

## 2016-06-15 NOTE — Clinical Social Work Note (Signed)
Patient in need of PT/OT eval in order to go back to SNF. RNCM notified.  Rick Cruz, [place]

## 2016-06-15 NOTE — Progress Notes (Signed)
Documentation shows pt on 13/6 and 40% O2.

## 2016-06-15 NOTE — Care Management Important Message (Signed)
Important Message  Patient Details  Name: Rick Cruz MRN: [id number] Date of Birth: 1946/02/26   Medicare Important Message Given:  Yes    Marya Lowden 06/15/2016, 11:12 AM

## 2016-06-15 NOTE — Progress Notes (Signed)
[facility]                                                                              Patient Demographics  Rick Cruz, is a 70 y.o. male, DOB - 1946-08-07, UVO:[id number]  Admit date - 06/11/2016   Admitting Physician Edwin Dada, MD  Outpatient Primary MD for the patient is [name], MD  Outpatient specialists:   LOS - 3  days    Chief Complaint  Patient presents with  . Respiratory Distress       Brief summary   70 y.o.WM PMHx Schizophrenia, OHS/OSA on BiPAP,COPD with chronic hypercarbia, Chronic Diastolic CHF, , Who presentedwith shortness of breath and was found to be hyponatremic incidentally. Staff at his facility noticed he was hypoxic to the 83s and called 9-1-1. EMS found the patient hypoxic to low 80s. Upon arrival to the ED he was noted to have a Na of 118 w/ normal crt of 0.78. 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      Assessment & Plan   Hyponatremia - Patient was admitted with dyspnea, incidentally found to be hyponatremia with sodium 118 at the time of admission. - Patient was admitted to stepdown unit, and nephrology was consulted. Patient was seen by Dr Jimmy Footman, recommended Lasix diuresis, patient had mild hypokalemia on clinical exam. Many reasons for SIADH but not clear evidence present. 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 - Continue Lasix, fluid restriction, daily weights - Continue beta blocker  Paroxysmal Afib(CHADS2Vasc is 3)  - continue apixaban, diltiazem, and BB  Chronic Anemia Hemoglobin stable  Code Status: Full code DVT Prophylaxis:  apixaban Family Communication: Discussed in detail with the patient, all imaging results, lab results explained to the patient and wife   Disposition Plan:  PT evaluation pending, back to skilled nursing facility  Time Spent in minutes 25 minutes  Procedures:    Consultants:   Nephrology  Antimicrobials:      Medications  Scheduled Meds: . apixaban  5 mg Oral BID  . atorvastatin  20 mg Oral q1800  . bisacodyl  10 mg Rectal Daily  . chlorhexidine  15 mL Mouth Rinse BID  . diltiazem  120 mg Oral Daily  . docusate  sodium  200 mg Oral BID  . furosemide  60 mg Oral BID  . gabapentin  300 mg Oral BID  . insulin aspart  0-15 Units Subcutaneous TID WC  . insulin aspart  0-5 Units Subcutaneous QHS  . insulin aspart  8 Units Subcutaneous TID WC  . insulin detemir  24 Units Subcutaneous Daily  . mouth rinse  15 mL Mouth Rinse q12n4p  . metoprolol tartrate  12.5 mg Oral BID  . pantoprazole  40 mg Oral BID  . sodium chloride flush  3 mL Intravenous Q12H  . terazosin  1 mg Oral QHS   Continuous Infusions:  PRN Meds:.acetaminophen **OR** acetaminophen, albuterol   Antibiotics   Anti-infectives    None        Subjective:   Rick Cruz was seen and examined today.  Currently denies any significant complaints. Patient  denies dizziness, chest pain, shortness of breath, abdominal pain, N/V/D/C, new weakness, numbess, tingling. No acute events overnight.    Objective:   Vitals:   06/14/16 1852 06/14/16 2100 06/15/16 0551 06/15/16 0900  BP: 122/63 138/77 120/60 120/72  Pulse: 84 75 75 (!) 103  Resp: 18 18 18 18   Temp: 98.4 F (36.9 C) 98 F (36.7 C) 98.8 F (37.1 C) 98.5 F (36.9 C)  TempSrc: Oral Oral Oral Oral  SpO2: 98% 97% 98% 98%  Weight:   121.2 kg (267 lb 3.2 oz)   Height:        Intake/Output Summary (Last 24 hours) at 06/15/16 1143 Last data filed at 06/15/16 0934  Gross per 24 hour  Intake             1015 ml  Output             1400 ml  Net             -385 ml     Wt Readings from Last 3 Encounters:  06/15/16 121.2 kg (267 lb 3.2 oz)  05/29/16 130 kg (286 lb 9.6 oz)  10/18/15 134.7 kg (297 lb)     Exam  General: Alert and oriented x 3, NAD  HEENT:     Neck: Supple, no JVD, no masses  Cardiovascular: S1 S2 auscultated, no rubs, murmurs or gallops. Regular rate and rhythm.  Respiratory: Clear to auscultation bilaterally, no wheezing, rales or rhonchi  Gastrointestinal: Obese, Soft, nontender, nondistended, + bowel sounds  Ext: no cyanosis clubbing or edema  Neuro: AAOx3, Cr N's II- XII. Strength 5/5 upper and lower extremities bilaterally  Skin: No rashes  Psych: Normal affect and demeanor, alert and oriented x3    Data Reviewed:  I have personally reviewed following labs and imaging studies  Micro Results No results found for this or any previous visit (from the past 240 hour(s)).  Radiology Reports Ct Head Wo Contrast  Result Date: 05/25/2016 CLINICAL DATA:  Sudden onset of altered mental status. EXAM: CT HEAD WITHOUT CONTRAST TECHNIQUE: Contiguous axial images were obtained from the base of the skull through the vertex without intravenous contrast. COMPARISON:  08/16/2015 FINDINGS: Brain: The brain shows generalized atrophy. There chronic appearing small  vessel ischemic changes of the cerebral hemispheric white matter. No sign of acute infarction, mass lesion, hemorrhage, hydrocephalus or extra-axial collection. Vascular: There is atherosclerotic calcification of the major vessels at the base of the brain. Skull: Negative Sinuses/Orbits: Clear Other: None IMPRESSION: No acute finding by CT. Atrophy and chronic small-vessel ischemic changes of the white matter. Electronically Signed   By:  Nelson Chimes M.D.   On: 05/25/2016 13:48   Dg Chest Portable 1 View  Result Date: 06/12/2016 CLINICAL DATA:  Acute onset of shortness of breath. Initial encounter. EXAM: PORTABLE CHEST 1 VIEW COMPARISON:  Chest radiograph performed 05/22/2016 FINDINGS: The lungs are well-aerated. Vascular congestion is noted. Mild bibasilar opacities may reflect mild interstitial edema. There is no evidence of pleural effusion or pneumothorax. The cardiomediastinal silhouette is borderline enlarged. No acute osseous abnormalities are seen. IMPRESSION: Vascular congestion and borderline cardiomegaly. Mild bibasilar opacities may reflect mild interstitial edema. Electronically Signed   By: Garald Balding M.D.   On: 06/12/2016 01:13   Dg Chest Port 1 View  Result Date: 05/22/2016 CLINICAL DATA:  Respiratory failure. EXAM: PORTABLE CHEST 1 VIEW COMPARISON:  05/21/2016. FINDINGS: Endotracheal tube, NG tube, right IJ line stable position. Stable cardiomegaly. Bibasilar pulmonary infiltrates and or edema noted. Slight worsening from prior exam. Small bilateral pleural effusions. Stable left apical pleural thickening noted. IMPRESSION: 1. Lines and tubes in stable position. 2. Cardiomegaly cardiomegaly with bibasilar infiltrates and or edema. Slight worsening from prior exam. Small left pleural effusion. Electronically Signed   By: Marcello Moores  Register   On: 05/22/2016 06:52   Dg Chest Port 1 View  Result Date: 05/21/2016 CLINICAL DATA:  70 year old male with a history of acute respiratory failure  EXAM: PORTABLE CHEST 1 VIEW COMPARISON:  05/20/2016, 05/19/2016, 05/18/2016 FINDINGS: Cardiomediastinal silhouette likely unchanged though partially obscured by overlying lung and pleural disease. Left rotation of the patient significantly degrades the image. Unchanged endotracheal tube terminating approximately 5.3 cm above the carina. Unchanged right IJ central venous catheter. Calcifications of the aorta. Gastric tube unchanged, terminating out of the field of view. Similar appearance of mixed interstitial and airspace opacities of the bilateral lungs without significant improvement. Hazy opacity at the right base likely related the pleural fluid. IMPRESSION: Unchanged endotracheal tube and gastric tube compared to the prior. Unchanged right IJ central venous catheter. Unchanged appearance of the lungs with persisting mixed interstitial and airspace opacities, potentially a combination of atelectasis, edema, consolidation, and/ or pleural effusion. Aortic atherosclerosis. Signed, Dulcy Fanny. Earleen Newport, DO [facility] Electronically Signed   By: Corrie Mckusick D.O.   On: 05/21/2016 08:09   Dg Chest Port 1 View  Result Date: 05/20/2016 CLINICAL DATA:  Sepsis, acute respiratory failure EXAM: PORTABLE CHEST 1 VIEW COMPARISON:  05/19/2016 FINDINGS: Endotracheal tube terminates 3 cm above the carina. Cardiomegaly with mild interstitial edema. Suspected small left pleural effusion. No pneumothorax. Right IJ venous catheter terminates in the mid SVC. Enteric tube courses below the diaphragm. IMPRESSION: Endotracheal tube terminates 3 cm above the carina. Cardiomegaly with mild interstitial edema and suspected small left pleural effusion. Electronically Signed   By: Julian Hy M.D.   On: 05/20/2016 07:25   Dg Chest Port 1 View  Result Date: 05/19/2016 CLINICAL DATA:  Aspiration pneumonia. EXAM: PORTABLE CHEST 1 VIEW COMPARISON:  05/18/2016 FINDINGS: The  endotracheal tube is 4 cm above the carina. The NG tube is coursing down the esophagus and into the stomach. The right IJ catheter has been removed. The heart is enlarged but stable. Persistent bibasilar infiltrates. Possible small left effusion. IMPRESSION: Stable support apparatus except the right IJ catheter has been removed. Persistent bibasilar infiltrates and possible small left effusion. Electronically Signed   By: Marijo Sanes M.D.   On: 05/19/2016 07:54   Dg Chest Port 1 View  Result Date: 05/18/2016 CLINICAL DATA:  Central catheter placement.  Hypoxia. EXAM: PORTABLE CHEST 1 VIEW COMPARISON:  Study obtained earlier in the day FINDINGS: Central catheter tip is in the superior vena cava. Nasogastric tube tip and side port are below the diaphragm. Endotracheal tube tip is 3.9 cm above the carina. No pneumothorax. There is a slight degree of interstitial edema. Lungs otherwise are clear. No airspace consolidation. Heart is mildly enlarged with pulmonary vascular within normal limits. No adenopathy. There is aortic atherosclerotic calcification. IMPRESSION: Tube and catheter positions as described without pneumothorax. Cardiomegaly with mild edema. Suspect a mild degree of congestive heart failure. No airspace consolidation. There is aortic atherosclerosis. Electronically Signed   By: Lowella Grip III M.D.   On: 05/18/2016 11:56   Dg Chest Port 1 View  Result Date: 05/18/2016 CLINICAL DATA:  Intubated patient.  Acute respiratory failure. EXAM: PORTABLE CHEST 1 VIEW COMPARISON:  Chest radiograph 05/17/2016 FINDINGS: Endotracheal tube tip remains in position at the level of the clavicular heads. The enteric tube has been removed. Unchanged cardiomegaly. Moderate pulmonary edema, increased from the prior study, with decreased aeration of the right lower lobe. Small right pleural effusion. No pneumothorax. IMPRESSION: 1. Endotracheal tube tip in unchanged position at the level of the clavicles. 2.  Moderate pulmonary edema, increased from prior study. 3. Small right pleural effusion. Electronically Signed   By: Ulyses Jarred M.D.   On: 05/18/2016 02:48   Dg Chest Portable 1 View  Result Date: 05/17/2016 CLINICAL DATA:  Status post intubation. EXAM: PORTABLE CHEST 1 VIEW COMPARISON:  05/17/16 FINDINGS: ET tube tip is above the carina. There is a nasogastric tube with tip below the field of view. Cardiac enlargement noted. Small pleural effusions are identified. Pulmonary vascular congestion. IMPRESSION: 1. Satisfactory position of ET tube with tip above the carina. 2. Mild CHF. Electronically Signed   By: Kerby Moors M.D.   On: 05/17/2016 18:31   Dg Chest Portable 1 View  Result Date: 05/17/2016 CLINICAL DATA:  Shortness of breath and altered mental status. EXAM: PORTABLE CHEST 1 VIEW COMPARISON:  02/20/2016 FINDINGS: Cardiomegaly and mild pulmonary vascular congestion noted. Mild peribronchial thickening is unchanged. The left retrocardiac region is difficult to evaluate on this study. No definite effusion or pneumothorax noted. IMPRESSION: Cardiomegaly with mild pulmonary vascular congestion. Left retrocardiac region difficult to evaluate on this study. Electronically Signed   By: Margarette Canada M.D.   On: 05/17/2016 17:48   Dg Abd Portable 1v  Result Date: 05/23/2016 CLINICAL DATA:  Nasogastric tube placement.  Abdominal distention EXAM: PORTABLE ABDOMEN - 1 VIEW COMPARISON:  May 18, 2016 FINDINGS: Nasogastric tube tip and side port are in the stomach. There is generalized bowel dilatation. No free air evident. IMPRESSION: Nasogastric tube tip and side port in stomach. Diffuse bowel dilatation. Suspect ileus, although a degree of bowel obstruction cannot be excluded. No free air evident. Electronically Signed   By: Lowella Grip III M.D.   On: 05/23/2016 11:21   Dg Abd Portable 1v  Result Date: 05/18/2016 CLINICAL DATA:  Orogastric tube placement EXAM: PORTABLE ABDOMEN - 1 VIEW  COMPARISON:  None. FINDINGS: The OG tube extends into the stomach with tip in the region of the proximal to mid gastric body. IMPRESSION: Orogastric tube extends into the stomach Electronically Signed   By: Andreas Newport M.D.   On: 05/18/2016 02:56    Lab Data:  CBC:  Recent Labs Lab 06/11/16 2353 06/12/16 0535 06/14/16 0322  WBC 14.6* 12.4* 13.3*  NEUTROABS 11.6*  --   --  HGB 11.6* 11.4* 11.7*  HCT 36.5* 36.9* 38.1*  MCV 83.1 84.1 85.4  PLT 162 163 245   Basic Metabolic Panel:  Recent Labs Lab 06/12/16 1657 06/12/16 2136 06/13/16 0155 06/14/16 0322 06/15/16 0340  NA 122* 123* 125* 126* 126*  K 5.2* 4.3 4.1 4.4 4.3  CL 82* 84* 84* 84* 84*  CO2 31 32 34* 33* 33*  GLUCOSE 206* 195* 170* 169* 229*  BUN <5* <5* <5* 5* 6  CREATININE 0.78 0.86 0.83 0.86 0.92  CALCIUM 8.4* 8.4* 8.4* 8.4* 8.6*   GFR: Estimated Creatinine Clearance: 101.8 mL/min (by C-G formula based on SCr of 0.92 mg/dL). Liver Function Tests: No results for input(s): AST, ALT, ALKPHOS, BILITOT, PROT, ALBUMIN in the last 168 hours. No results for input(s): LIPASE, AMYLASE in the last 168 hours. No results for input(s): AMMONIA in the last 168 hours. Coagulation Profile: No results for input(s): INR, PROTIME in the last 168 hours. Cardiac Enzymes: No results for input(s): CKTOTAL, CKMB, CKMBINDEX, TROPONINI in the last 168 hours. BNP (last 3 results) No results for input(s): PROBNP in the last 8760 hours. HbA1C: No results for input(s): HGBA1C in the last 72 hours. CBG:  Recent Labs Lab 06/14/16 0732 06/14/16 1225 06/14/16 1626 06/14/16 2114 06/15/16 0557  GLUCAP 192* 192* 179* 174* 266*   Lipid Profile: No results for input(s): CHOL, HDL, LDLCALC, TRIG, CHOLHDL, LDLDIRECT in the last 72 hours. Thyroid Function Tests: No results for input(s): TSH, T4TOTAL, FREET4, T3FREE, THYROIDAB in the last 72 hours. Anemia Panel: No results for input(s): VITAMINB12, FOLATE, FERRITIN, TIBC, IRON,  RETICCTPCT in the last 72 hours. Urine analysis:    Component Value Date/Time   COLORURINE YELLOW 06/12/2016 0030   APPEARANCEUR CLEAR 06/12/2016 0030   LABSPEC 1.007 06/12/2016 0030   PHURINE 6.0 06/12/2016 0030   GLUCOSEU 500 (A) 06/12/2016 0030   HGBUR NEGATIVE 06/12/2016 0030   BILIRUBINUR NEGATIVE 06/12/2016 0030   KETONESUR NEGATIVE 06/12/2016 0030   PROTEINUR NEGATIVE 06/12/2016 0030   UROBILINOGEN 1.0 12/05/2014 1519   NITRITE NEGATIVE 06/12/2016 0030   LEUKOCYTESUR NEGATIVE 06/12/2016 0030     Jestina Stephani M.D. [facility] 06/15/2016, 11:43 AM  Pager: [phone] Between 7am to 7pm - call Pager - [phone]  After 7pm go to [email or web address] - password [email or web address]  Call night coverage person covering after 7pm

## 2016-06-15 NOTE — Clinical Social Work Note (Addendum)
Patient evaluated by PT. CSW started insurance authorization with [employer]. Admissions coordinator at [place] notified.  Dayton Scrape, [place] [phone]  3:23 pm Insurance authorization obtained: [id number]. MD and SNF notified.  Dayton Scrape, [place]

## 2016-06-15 NOTE — Discharge Summary (Addendum)
Physician Discharge Summary   Patient ID: Rick Cruz MRN: [id number] DOB/AGE: [birth year] 70 y.o.  Admit date: 06/11/2016 Discharge date: 06/15/2016  Primary Care Physician:  [name], MD  Discharge Diagnoses:    . Hyponatremia . Obesity hypoventilation syndrome ([place]) . Chronic respiratory failure 02 dep with hypercarbia . Essential hypertension . Paranoid schizophrenia ([place]) . Chronic diastolic CHF (congestive heart failure) ([place])   Consults:   Nephrology, Dr. Jimmy Footman   Recommendations for Outpatient Follow-up:   1. Fluid restriction 1000 mL per 24 hours 2. Please repeat CBC/BMET at next visit 3. Per Respiratory therapist, settings Bipap at night 13/6 and 40% O2.   DIET: Heart healthy diet    Allergies:   Allergies  Allergen Reactions  . Phenergan [Promethazine Hcl] Other (See Comments)    Becomes very confused and aggressive  . Haldol [Haloperidol Lactate] Other (See Comments)    Shaking   . Metformin And Related Diarrhea     DISCHARGE MEDICATIONS: Current Discharge Medication List    START taking these medications   Details  bisacodyl (DULCOLAX) 10 MG suppository Place 1 suppository (10 mg total) rectally daily. Qty: 12 suppository, Refills: 0    !! insulin aspart (NOVOLOG) 100 UNIT/ML injection Inject 8 Units into the skin 3 (three) times daily with meals. Qty: 10 mL, Refills: 11    !! insulin aspart (NOVOLOG) 100 UNIT/ML injection Inject 0-9 Units into the skin 3 (three) times daily with meals. Sliding scale CBG 70 - 120: 0 units CBG 121 - 150: 1 unit,  CBG 151 - 200: 2 units,  CBG 201 - 250: 3 units,  CBG 251 - 300: 5 units,  CBG 301 - 350: 7 units,  CBG 351 - 400: 9 units   CBG > 400: 9 units and notify your MD Qty: 10 mL, Refills: 11     !! - Potential duplicate medications found. Please discuss with provider.    CONTINUE these medications which have CHANGED   Details  apixaban (ELIQUIS) 5 MG TABS tablet Take 1 tablet (5 mg total) by mouth 2  (two) times daily. Qty: 60 tablet, Refills: 1    diltiazem (CARDIZEM CD) 120 MG 24 hr capsule Take 1 capsule (120 mg total) by mouth daily.    furosemide (LASIX) 20 MG tablet Take 3 tablets (60 mg total) by mouth 2 (two) times daily. Qty: 30 tablet      CONTINUE these medications which have NOT CHANGED   Details  acetaminophen (TYLENOL) 325 MG tablet Take 2 tablets (650 mg total) by mouth every 6 (six) hours as needed for mild pain or fever.    albuterol (PROVENTIL HFA;VENTOLIN HFA) 108 (90 BASE) MCG/ACT inhaler Inhale 2 puffs into the lungs every 6 (six) hours as needed for wheezing or shortness of breath. Qty: 1 Inhaler, Refills: 2    albuterol (PROVENTIL) (2.5 MG/3ML) 0.083% nebulizer solution Take 3 mLs (2.5 mg total) by nebulization every 6 (six) hours as needed for wheezing or shortness of breath. Qty: 75 mL, Refills: 0    cholecalciferol (VITAMIN D) 1000 UNITS tablet Take 2,000 Units by mouth daily.    Cinnamon 500 MG capsule Take 1,000 mg by mouth every morning.     docusate sodium (COLACE) 100 MG capsule Take 100 mg by mouth 2 (two) times daily.    gabapentin (NEURONTIN) 300 MG capsule Take 1 capsule (300 mg total) by mouth 2 (two) times daily. Qty: 60 capsule, Refills: 0    insulin detemir (LEVEMIR) 100 UNIT/ML injection  Inject 24 Units into the skin daily.    loperamide (IMODIUM) 2 MG capsule Take 1 capsule (2 mg total) by mouth 3 (three) times daily as needed for diarrhea or loose stools. Qty: 30 capsule, Refills: 0    metoprolol tartrate (LOPRESSOR) 25 MG tablet Take 0.5 tablets (12.5 mg total) by mouth 2 (two) times daily. Qty: 60 tablet, Refills: 0    pantoprazole (PROTONIX) 40 MG tablet Take 40 mg by mouth 2 (two) times daily.    simvastatin (ZOCOR) 40 MG tablet Take 40 mg by mouth every evening.    terazosin (HYTRIN) 1 MG capsule Take 1 capsule (1 mg total) by mouth at bedtime. Qty: 30 capsule, Refills: 0      STOP taking these medications     citalopram  (CELEXA) 20 MG tablet          Brief H and P: For complete details please refer to admission H and P, but in brief 70 y.o.WM PMHx Schizophrenia,OHS/OSA on BiPAP,COPD with chronic hypercarbia, Chronic Diastolic CHF, , Who presentedwith shortness of breath and was found to be hyponatremic incidentally. Staff at his facility noticed he was hypoxic to the 30s and called 9-1-1. EMS found the patient hypoxic to low 80s. Upon arrival to the ED he was noted to have aNa of 118 w/ normal crt of 0.78. 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   Hospital Course:   Hyponatremia - Patient was admitted with dyspnea, incidentally found to be hyponatremia with sodium 118 at the time of admission. - Patient was admitted to stepdown unit, and nephrology was consulted. Patient was seen by Dr Jimmy Footman, recommended Lasix diuresis, patient had mild hypokalemia on clinical exam. Many reasons for SIADH but not clear evidence present. Patient's wife also reported that patient had been drinking plenty of water at the facility -improving with Lasix diuresis  - continue Lasix and fluid restriction, max 1000cc/24hrs at the facility  Sleep apnea, obesity hypoventilation with chronic respiratory failure with hypercarbia -Continue BiPAP at night, settings per RT  HTN - Currently stable, continue Cardizem CD 120 mg daily, Lasix increased to 60 mg BID and Metoprolol 12.5 mg BID  Undifferentiated Schizophrenia -Stopped Geodon at last admission - follow  DM Type 2 uncontrolled with complication -9/1 hemoglobin A1c =7.4  -Lantus 24 units daily, novolog meal coverage, sliding scale  insulin  Acute on Chronic diastolic CHF -Currently stable, euvolemic, negative balance of 30 mL. - Continue Lasix, fluid restriction, daily weights - Continue beta blocker  Paroxysmal Afib(CHADS2Vasc is 3) - continue apixaban, diltiazem, and BB  Chronic Anemia Hemoglobin stable  Day of Discharge BP 109/60 (BP Location: Right Arm)   Pulse 77   Temp 98.5 F (36.9 C) (Oral)   Resp 18   Ht 6' (1.829 m)   Wt 121.2 kg (267 lb 3.2 oz)   SpO2 91%   BMI 36.24 kg/m   Physical Exam: General: Alert and awake oriented x3 not in any acute distress. HEENT: anicteric sclera, pupils reactive to light and accommodation CVS: S1-S2 clear no murmur rubs or gallops Chest: clear to auscultation bilaterally, no wheezing rales or rhonchi Abdomen: obese, soft nontender, nondistended, normal bowel sounds Extremities: no cyanosis, clubbing or edema noted bilaterally Neuro: Cranial nerves II-XII intact,  no focal neurological deficits   The results of significant diagnostics from this hospitalization (including imaging, microbiology, ancillary and laboratory) are listed below for reference.    LAB RESULTS: Basic Metabolic Panel:  Recent Labs Lab 06/14/16 0322 06/15/16 0340  NA 126* 126*  K 4.4 4.3  CL 84* 84*  CO2 33* 33*  GLUCOSE 169* 229*  BUN 5* 6  CREATININE 0.86 0.92  CALCIUM 8.4* 8.6*   Liver Function Tests: No results for input(s): AST, ALT, ALKPHOS, BILITOT, PROT, ALBUMIN in the last 168 hours. No results for input(s): LIPASE, AMYLASE in the last 168 hours. No results for input(s): AMMONIA in the last 168 hours. CBC:  Recent Labs Lab 06/11/16 2353 06/12/16 0535 06/14/16 0322  WBC 14.6* 12.4* 13.3*  NEUTROABS 11.6*  --   --   HGB 11.6* 11.4* 11.7*  HCT 36.5* 36.9* 38.1*  MCV 83.1 84.1 85.4  PLT 162 163 154   Cardiac Enzymes: No results for input(s): CKTOTAL, CKMB, CKMBINDEX, TROPONINI in the last 168 hours. BNP: Invalid input(s): POCBNP CBG:  Recent Labs Lab  06/15/16 0557 06/15/16 1142  GLUCAP 266* 138*    Significant Diagnostic Studies:  Dg Chest Portable 1 View  Result Date: 06/12/2016 CLINICAL DATA:  Acute onset of shortness of breath. Initial encounter. EXAM: PORTABLE CHEST 1 VIEW COMPARISON:  Chest radiograph performed 05/22/2016 FINDINGS: The lungs are well-aerated. Vascular congestion is noted. Mild bibasilar opacities may reflect mild interstitial edema. There is no evidence of pleural effusion or pneumothorax. The cardiomediastinal silhouette is borderline enlarged. No acute osseous abnormalities are seen. IMPRESSION: Vascular congestion and borderline cardiomegaly. Mild bibasilar opacities may reflect mild interstitial edema. Electronically Signed   By: Garald Balding M.D.   On: 06/12/2016 01:13    2D ECHO:   Disposition and Follow-up:    DISPOSITION:  SNF   DISCHARGE FOLLOW-UP Follow-up Information    [name], MD. Schedule an appointment as soon as possible for a visit in 2 week(s).   Specialty:  Internal Medicine Contact information: [address] [place] [address] [phone]            Time spent on Discharge: 35 mins   Signed:   Johnwesley Lederman M.D. [facility] 06/15/2016, 3:14 PM Pager: [phone]

## 2016-06-15 NOTE — Evaluation (Signed)
Physical Therapy Evaluation Patient Details Name: Rick Cruz MRN: [id number] DOB: Sep 10, 1946 Today's Date: 06/15/2016   History of Present Illness  70 years old male with SOB and hyponatremia admitted from [facility]. PMHx: schizophrenia, COPD, CHF  Clinical Impression  Rick Cruz is pleasant and wanting to mobilize and become stronger for return home. Mom present and states pt not ready to return home at this time due to inability to care for himself at [facility]. Pt with decreased activity tolerance and unable to ambulate greater than 40' with HR 77-112 with limited gait and sats only 91% on 4L. Pt would benefit from acute therapy as well as continued ST-SNF to maximize strength, function, balance and gait. Recommend daily mobility with nursing staff. Pt educated for HEP and encouraged to perform daily as well.       Follow Up Recommendations Supervision/Assistance - 24 hour;SNF    Equipment Recommendations  None recommended by PT    Recommendations for Other Services OT consult     Precautions / Restrictions Precautions Precautions: Fall      Mobility  Bed Mobility Overal bed mobility: Modified Independent                Transfers Overall transfer level: Needs assistance   Transfers: Sit to/from Stand Sit to Stand: Min guard         General transfer comment: cues for safety and hand placement  Ambulation/Gait Ambulation/Gait assistance: Min guard Ambulation Distance (Feet): 40 Feet Assistive device: Rolling walker (2 wheeled) Gait Pattern/deviations: Step-through pattern;Decreased stride length;Trunk flexed   Gait velocity interpretation: Below normal speed for age/gender General Gait Details: cues for posture, position in RW and safety, pt has tendency to walk away from RW. Pt fatigued and declined further ambulation  Stairs            Wheelchair Mobility    Modified Rankin (Stroke Patients Only)       Balance Overall balance assessment:  Needs assistance   Sitting balance-Leahy Scale: Good       Standing balance-Leahy Scale: Poor                               Pertinent Vitals/Pain Pain Assessment: No/denies pain    Home Living Family/patient expects to be discharged to:: Skilled nursing facility Living Arrangements: Spouse/significant other Available Help at Discharge: Family;Available 24 hours/day Type of Home: House Home Access: Ramped entrance     Home Layout: One level Home Equipment: Walker - standard;Grab bars - tub/shower Additional Comments: lift chair    Prior Function Level of Independence: Needs assistance      ADL's / Homemaking Assistance Needed: wife helped with bathing back and with feet/socks/shoes  Comments: pt has been at [facility] for 14 days since last admission with assist for all ADLs and limited gait per mom. Mom reports pt not yet ready to return home but pt states he wants to go home     Hand Dominance        Extremity/Trunk Assessment   Upper Extremity Assessment: Overall WFL for tasks assessed           Lower Extremity Assessment: Overall WFL for tasks assessed      Cervical / Trunk Assessment: Normal  Communication   Communication: No difficulties  Cognition Arousal/Alertness: Awake/alert Behavior During Therapy: Flat affect Overall Cognitive Status: Within Functional Limits for tasks assessed  General Comments      Exercises General Exercises - Lower Extremity Long Arc Quad: AROM;Both;Seated;10 reps Hip Flexion/Marching: AROM;Both;10 reps;Seated   Assessment/Plan    PT Assessment Patient needs continued PT services  PT Problem List Decreased strength;Decreased activity tolerance;Decreased balance;Decreased mobility;Decreased safety awareness;Cardiopulmonary status limiting activity;Obesity          PT Treatment Interventions DME instruction;Gait training;Stair training;Functional mobility  training;Therapeutic activities;Therapeutic exercise;Balance training;Patient/family education    PT Goals (Current goals can be found in the Care Plan section)  Acute Rehab PT Goals Patient Stated Goal: return home PT Goal Formulation: With patient/family Time For Goal Achievement: 06/29/16 Potential to Achieve Goals: Fair    Frequency Min 3X/week   Barriers to discharge        Co-evaluation               End of Session Equipment Utilized During Treatment: Gait belt;Oxygen Activity Tolerance: Patient limited by fatigue Patient left: in chair;with call bell/phone within reach;with chair alarm set;with family/visitor present Nurse Communication: Mobility status         Time: 9432-0037 PT Time Calculation (min) (ACUTE ONLY): 22 min   Charges:   PT Evaluation $PT Eval Moderate Complexity: 1 Procedure     PT G CodesMelford Aase 06/15/2016, 1:06 PM Elwyn Reach, [place]

## 2016-06-15 NOTE — Progress Notes (Signed)
Per Dr. Tana Coast, contacted Respiratory, requesting documentation r/t BiPap settings for imminent discharge.

## 2016-06-15 NOTE — Progress Notes (Signed)
Called [facility] at [phone], and [place] (receptionist) answered initial call.  Attempted to give report to receiving nurse, however, after 42min on hold, no one came to phone.

## 2016-06-15 NOTE — Progress Notes (Signed)
Orders received for pt discharge.  AVS printed and placed with [facility] discharge paperwork, to be given to [facility].  IV removed and site remains clean, dry, intact.  Telemetry removed.  Pt in stable condition and awaiting transport.

## 2016-06-15 NOTE — Clinical Social Work Note (Signed)
CSW facilitated patient discharge including contacting patient family and facility to confirm patient discharge plans. Clinical information faxed to facility and family agreeable with plan. CSW arranged ambulance transport via [facility] to [facility]. RN to call report prior to discharge ([phone]).  CSW will sign off for now as social work intervention is no longer needed. Please consult us again if new needs arise.  Haziel Molner, CSW [phone]   

## 2016-06-17 DIAGNOSIS — E118 Type 2 diabetes mellitus with unspecified complications: Secondary | ICD-10-CM | POA: Diagnosis not present

## 2016-06-17 DIAGNOSIS — I480 Paroxysmal atrial fibrillation: Secondary | ICD-10-CM | POA: Diagnosis not present

## 2016-06-17 DIAGNOSIS — G4733 Obstructive sleep apnea (adult) (pediatric): Secondary | ICD-10-CM | POA: Diagnosis not present

## 2016-06-17 DIAGNOSIS — F200 Paranoid schizophrenia: Secondary | ICD-10-CM | POA: Diagnosis not present

## 2016-06-18 ENCOUNTER — Ambulatory Visit (INDEPENDENT_AMBULATORY_CARE_PROVIDER_SITE_OTHER): Payer: PPO | Admitting: Ophthalmology

## 2016-06-18 DIAGNOSIS — E222 Syndrome of inappropriate secretion of antidiuretic hormone: Secondary | ICD-10-CM | POA: Diagnosis not present

## 2016-06-18 DIAGNOSIS — H35033 Hypertensive retinopathy, bilateral: Secondary | ICD-10-CM

## 2016-06-18 DIAGNOSIS — H353121 Nonexudative age-related macular degeneration, left eye, early dry stage: Secondary | ICD-10-CM

## 2016-06-18 DIAGNOSIS — H43813 Vitreous degeneration, bilateral: Secondary | ICD-10-CM

## 2016-06-18 DIAGNOSIS — H34231 Retinal artery branch occlusion, right eye: Secondary | ICD-10-CM

## 2016-06-18 DIAGNOSIS — E871 Hypo-osmolality and hyponatremia: Secondary | ICD-10-CM | POA: Diagnosis not present

## 2016-06-18 DIAGNOSIS — I5032 Chronic diastolic (congestive) heart failure: Secondary | ICD-10-CM | POA: Diagnosis not present

## 2016-06-18 DIAGNOSIS — I10 Essential (primary) hypertension: Secondary | ICD-10-CM

## 2016-06-18 DIAGNOSIS — I4891 Unspecified atrial fibrillation: Secondary | ICD-10-CM | POA: Diagnosis not present

## 2016-06-21 DIAGNOSIS — E662 Morbid (severe) obesity with alveolar hypoventilation: Secondary | ICD-10-CM | POA: Diagnosis not present

## 2016-06-21 DIAGNOSIS — G894 Chronic pain syndrome: Secondary | ICD-10-CM | POA: Diagnosis not present

## 2016-06-21 DIAGNOSIS — K219 Gastro-esophageal reflux disease without esophagitis: Secondary | ICD-10-CM | POA: Diagnosis not present

## 2016-06-21 DIAGNOSIS — I480 Paroxysmal atrial fibrillation: Secondary | ICD-10-CM | POA: Diagnosis not present

## 2016-06-21 DIAGNOSIS — E871 Hypo-osmolality and hyponatremia: Secondary | ICD-10-CM | POA: Diagnosis not present

## 2016-06-21 DIAGNOSIS — F200 Paranoid schizophrenia: Secondary | ICD-10-CM | POA: Diagnosis not present

## 2016-06-21 DIAGNOSIS — E784 Other hyperlipidemia: Secondary | ICD-10-CM | POA: Diagnosis not present

## 2016-06-21 DIAGNOSIS — I10 Essential (primary) hypertension: Secondary | ICD-10-CM | POA: Diagnosis not present

## 2016-06-21 DIAGNOSIS — F329 Major depressive disorder, single episode, unspecified: Secondary | ICD-10-CM | POA: Diagnosis not present

## 2016-06-21 DIAGNOSIS — I5032 Chronic diastolic (congestive) heart failure: Secondary | ICD-10-CM | POA: Diagnosis not present

## 2016-06-28 DIAGNOSIS — E222 Syndrome of inappropriate secretion of antidiuretic hormone: Secondary | ICD-10-CM | POA: Diagnosis not present

## 2016-06-28 DIAGNOSIS — E119 Type 2 diabetes mellitus without complications: Secondary | ICD-10-CM | POA: Diagnosis not present

## 2016-06-28 DIAGNOSIS — I5032 Chronic diastolic (congestive) heart failure: Secondary | ICD-10-CM | POA: Diagnosis not present

## 2016-06-28 DIAGNOSIS — J9600 Acute respiratory failure, unspecified whether with hypoxia or hypercapnia: Secondary | ICD-10-CM | POA: Diagnosis not present

## 2016-06-28 DIAGNOSIS — Z794 Long term (current) use of insulin: Secondary | ICD-10-CM | POA: Diagnosis not present

## 2016-07-02 DIAGNOSIS — I4891 Unspecified atrial fibrillation: Secondary | ICD-10-CM | POA: Diagnosis not present

## 2016-07-02 DIAGNOSIS — I5032 Chronic diastolic (congestive) heart failure: Secondary | ICD-10-CM | POA: Diagnosis not present

## 2016-07-02 DIAGNOSIS — E119 Type 2 diabetes mellitus without complications: Secondary | ICD-10-CM | POA: Diagnosis not present

## 2016-07-02 DIAGNOSIS — Z794 Long term (current) use of insulin: Secondary | ICD-10-CM | POA: Diagnosis not present

## 2016-07-02 DIAGNOSIS — J9600 Acute respiratory failure, unspecified whether with hypoxia or hypercapnia: Secondary | ICD-10-CM | POA: Diagnosis not present

## 2016-07-05 DIAGNOSIS — J449 Chronic obstructive pulmonary disease, unspecified: Secondary | ICD-10-CM | POA: Diagnosis not present

## 2016-07-05 DIAGNOSIS — J9620 Acute and chronic respiratory failure, unspecified whether with hypoxia or hypercapnia: Secondary | ICD-10-CM | POA: Diagnosis not present

## 2016-07-06 DIAGNOSIS — R278 Other lack of coordination: Secondary | ICD-10-CM | POA: Diagnosis not present

## 2016-07-06 DIAGNOSIS — E1165 Type 2 diabetes mellitus with hyperglycemia: Secondary | ICD-10-CM | POA: Diagnosis not present

## 2016-07-06 DIAGNOSIS — I480 Paroxysmal atrial fibrillation: Secondary | ICD-10-CM | POA: Diagnosis not present

## 2016-07-06 DIAGNOSIS — I110 Hypertensive heart disease with heart failure: Secondary | ICD-10-CM | POA: Diagnosis not present

## 2016-07-06 DIAGNOSIS — J449 Chronic obstructive pulmonary disease, unspecified: Secondary | ICD-10-CM | POA: Diagnosis not present

## 2016-07-06 DIAGNOSIS — R2681 Unsteadiness on feet: Secondary | ICD-10-CM | POA: Diagnosis not present

## 2016-07-06 DIAGNOSIS — I5032 Chronic diastolic (congestive) heart failure: Secondary | ICD-10-CM | POA: Diagnosis not present

## 2016-07-06 DIAGNOSIS — S82843A Displaced bimalleolar fracture of unspecified lower leg, initial encounter for closed fracture: Secondary | ICD-10-CM | POA: Diagnosis not present

## 2016-07-06 DIAGNOSIS — J9612 Chronic respiratory failure with hypercapnia: Secondary | ICD-10-CM | POA: Diagnosis not present

## 2016-07-06 DIAGNOSIS — G4733 Obstructive sleep apnea (adult) (pediatric): Secondary | ICD-10-CM | POA: Diagnosis not present

## 2016-07-06 DIAGNOSIS — E662 Morbid (severe) obesity with alveolar hypoventilation: Secondary | ICD-10-CM | POA: Diagnosis not present

## 2016-07-06 DIAGNOSIS — I2510 Atherosclerotic heart disease of native coronary artery without angina pectoris: Secondary | ICD-10-CM | POA: Diagnosis not present

## 2016-07-10 DIAGNOSIS — E1165 Type 2 diabetes mellitus with hyperglycemia: Secondary | ICD-10-CM | POA: Diagnosis not present

## 2016-07-10 DIAGNOSIS — J449 Chronic obstructive pulmonary disease, unspecified: Secondary | ICD-10-CM | POA: Diagnosis not present

## 2016-07-10 DIAGNOSIS — E662 Morbid (severe) obesity with alveolar hypoventilation: Secondary | ICD-10-CM | POA: Diagnosis not present

## 2016-07-10 DIAGNOSIS — I5032 Chronic diastolic (congestive) heart failure: Secondary | ICD-10-CM | POA: Diagnosis not present

## 2016-07-10 DIAGNOSIS — R278 Other lack of coordination: Secondary | ICD-10-CM | POA: Diagnosis not present

## 2016-07-10 DIAGNOSIS — J9612 Chronic respiratory failure with hypercapnia: Secondary | ICD-10-CM | POA: Diagnosis not present

## 2016-07-10 DIAGNOSIS — I480 Paroxysmal atrial fibrillation: Secondary | ICD-10-CM | POA: Diagnosis not present

## 2016-07-10 DIAGNOSIS — I110 Hypertensive heart disease with heart failure: Secondary | ICD-10-CM | POA: Diagnosis not present

## 2016-07-10 DIAGNOSIS — I2510 Atherosclerotic heart disease of native coronary artery without angina pectoris: Secondary | ICD-10-CM | POA: Diagnosis not present

## 2016-07-10 DIAGNOSIS — R2681 Unsteadiness on feet: Secondary | ICD-10-CM | POA: Diagnosis not present

## 2016-07-12 DIAGNOSIS — I480 Paroxysmal atrial fibrillation: Secondary | ICD-10-CM | POA: Diagnosis not present

## 2016-07-12 DIAGNOSIS — E662 Morbid (severe) obesity with alveolar hypoventilation: Secondary | ICD-10-CM | POA: Diagnosis not present

## 2016-07-12 DIAGNOSIS — E1165 Type 2 diabetes mellitus with hyperglycemia: Secondary | ICD-10-CM | POA: Diagnosis not present

## 2016-07-12 DIAGNOSIS — I5032 Chronic diastolic (congestive) heart failure: Secondary | ICD-10-CM | POA: Diagnosis not present

## 2016-07-12 DIAGNOSIS — J449 Chronic obstructive pulmonary disease, unspecified: Secondary | ICD-10-CM | POA: Diagnosis not present

## 2016-07-12 DIAGNOSIS — I2510 Atherosclerotic heart disease of native coronary artery without angina pectoris: Secondary | ICD-10-CM | POA: Diagnosis not present

## 2016-07-12 DIAGNOSIS — I110 Hypertensive heart disease with heart failure: Secondary | ICD-10-CM | POA: Diagnosis not present

## 2016-07-12 DIAGNOSIS — R2681 Unsteadiness on feet: Secondary | ICD-10-CM | POA: Diagnosis not present

## 2016-07-12 DIAGNOSIS — J9612 Chronic respiratory failure with hypercapnia: Secondary | ICD-10-CM | POA: Diagnosis not present

## 2016-07-12 DIAGNOSIS — R278 Other lack of coordination: Secondary | ICD-10-CM | POA: Diagnosis not present

## 2016-07-16 DIAGNOSIS — I480 Paroxysmal atrial fibrillation: Secondary | ICD-10-CM | POA: Diagnosis not present

## 2016-07-16 DIAGNOSIS — R2681 Unsteadiness on feet: Secondary | ICD-10-CM | POA: Diagnosis not present

## 2016-07-16 DIAGNOSIS — J9612 Chronic respiratory failure with hypercapnia: Secondary | ICD-10-CM | POA: Diagnosis not present

## 2016-07-16 DIAGNOSIS — E662 Morbid (severe) obesity with alveolar hypoventilation: Secondary | ICD-10-CM | POA: Diagnosis not present

## 2016-07-16 DIAGNOSIS — I5032 Chronic diastolic (congestive) heart failure: Secondary | ICD-10-CM | POA: Diagnosis not present

## 2016-07-16 DIAGNOSIS — I2510 Atherosclerotic heart disease of native coronary artery without angina pectoris: Secondary | ICD-10-CM | POA: Diagnosis not present

## 2016-07-16 DIAGNOSIS — J449 Chronic obstructive pulmonary disease, unspecified: Secondary | ICD-10-CM | POA: Diagnosis not present

## 2016-07-16 DIAGNOSIS — E1165 Type 2 diabetes mellitus with hyperglycemia: Secondary | ICD-10-CM | POA: Diagnosis not present

## 2016-07-16 DIAGNOSIS — I110 Hypertensive heart disease with heart failure: Secondary | ICD-10-CM | POA: Diagnosis not present

## 2016-07-16 DIAGNOSIS — R278 Other lack of coordination: Secondary | ICD-10-CM | POA: Diagnosis not present

## 2016-07-17 DIAGNOSIS — L603 Nail dystrophy: Secondary | ICD-10-CM | POA: Diagnosis not present

## 2016-07-17 DIAGNOSIS — E1142 Type 2 diabetes mellitus with diabetic polyneuropathy: Secondary | ICD-10-CM | POA: Diagnosis not present

## 2016-07-18 DIAGNOSIS — G4733 Obstructive sleep apnea (adult) (pediatric): Secondary | ICD-10-CM | POA: Diagnosis not present

## 2016-07-18 DIAGNOSIS — E662 Morbid (severe) obesity with alveolar hypoventilation: Secondary | ICD-10-CM | POA: Diagnosis not present

## 2016-07-18 DIAGNOSIS — I2510 Atherosclerotic heart disease of native coronary artery without angina pectoris: Secondary | ICD-10-CM | POA: Diagnosis not present

## 2016-07-18 DIAGNOSIS — I5032 Chronic diastolic (congestive) heart failure: Secondary | ICD-10-CM | POA: Diagnosis not present

## 2016-07-18 DIAGNOSIS — I110 Hypertensive heart disease with heart failure: Secondary | ICD-10-CM | POA: Diagnosis not present

## 2016-07-18 DIAGNOSIS — J449 Chronic obstructive pulmonary disease, unspecified: Secondary | ICD-10-CM | POA: Diagnosis not present

## 2016-07-18 DIAGNOSIS — R2681 Unsteadiness on feet: Secondary | ICD-10-CM | POA: Diagnosis not present

## 2016-07-18 DIAGNOSIS — S82843A Displaced bimalleolar fracture of unspecified lower leg, initial encounter for closed fracture: Secondary | ICD-10-CM | POA: Diagnosis not present

## 2016-07-18 DIAGNOSIS — E1165 Type 2 diabetes mellitus with hyperglycemia: Secondary | ICD-10-CM | POA: Diagnosis not present

## 2016-07-18 DIAGNOSIS — R278 Other lack of coordination: Secondary | ICD-10-CM | POA: Diagnosis not present

## 2016-07-18 DIAGNOSIS — I480 Paroxysmal atrial fibrillation: Secondary | ICD-10-CM | POA: Diagnosis not present

## 2016-07-18 DIAGNOSIS — J9612 Chronic respiratory failure with hypercapnia: Secondary | ICD-10-CM | POA: Diagnosis not present

## 2016-07-19 DIAGNOSIS — I10 Essential (primary) hypertension: Secondary | ICD-10-CM | POA: Diagnosis not present

## 2016-07-19 DIAGNOSIS — J449 Chronic obstructive pulmonary disease, unspecified: Secondary | ICD-10-CM | POA: Diagnosis not present

## 2016-07-19 DIAGNOSIS — E119 Type 2 diabetes mellitus without complications: Secondary | ICD-10-CM | POA: Diagnosis not present

## 2016-07-19 DIAGNOSIS — F329 Major depressive disorder, single episode, unspecified: Secondary | ICD-10-CM | POA: Diagnosis not present

## 2016-07-19 DIAGNOSIS — F32A Depression, unspecified: Secondary | ICD-10-CM | POA: Insufficient documentation

## 2016-07-20 DIAGNOSIS — R2681 Unsteadiness on feet: Secondary | ICD-10-CM | POA: Diagnosis not present

## 2016-07-20 DIAGNOSIS — I2510 Atherosclerotic heart disease of native coronary artery without angina pectoris: Secondary | ICD-10-CM | POA: Diagnosis not present

## 2016-07-20 DIAGNOSIS — E662 Morbid (severe) obesity with alveolar hypoventilation: Secondary | ICD-10-CM | POA: Diagnosis not present

## 2016-07-20 DIAGNOSIS — J9612 Chronic respiratory failure with hypercapnia: Secondary | ICD-10-CM | POA: Diagnosis not present

## 2016-07-20 DIAGNOSIS — I5032 Chronic diastolic (congestive) heart failure: Secondary | ICD-10-CM | POA: Diagnosis not present

## 2016-07-20 DIAGNOSIS — J449 Chronic obstructive pulmonary disease, unspecified: Secondary | ICD-10-CM | POA: Diagnosis not present

## 2016-07-20 DIAGNOSIS — I110 Hypertensive heart disease with heart failure: Secondary | ICD-10-CM | POA: Diagnosis not present

## 2016-07-20 DIAGNOSIS — I480 Paroxysmal atrial fibrillation: Secondary | ICD-10-CM | POA: Diagnosis not present

## 2016-07-20 DIAGNOSIS — E1165 Type 2 diabetes mellitus with hyperglycemia: Secondary | ICD-10-CM | POA: Diagnosis not present

## 2016-07-20 DIAGNOSIS — R278 Other lack of coordination: Secondary | ICD-10-CM | POA: Diagnosis not present

## 2016-07-23 ENCOUNTER — Encounter (HOSPITAL_COMMUNITY): Payer: Self-pay

## 2016-07-23 ENCOUNTER — Emergency Department (HOSPITAL_COMMUNITY): Payer: PPO

## 2016-07-23 ENCOUNTER — Emergency Department (HOSPITAL_COMMUNITY)
Admission: EM | Admit: 2016-07-23 | Discharge: 2016-07-24 | Disposition: A | Discharge status: Home / Self Care | Payer: PPO | Attending: Emergency Medicine | Admitting: Emergency Medicine

## 2016-07-23 DIAGNOSIS — R404 Transient alteration of awareness: Secondary | ICD-10-CM | POA: Diagnosis not present

## 2016-07-23 DIAGNOSIS — Z87891 Personal history of nicotine dependence: Secondary | ICD-10-CM | POA: Insufficient documentation

## 2016-07-23 DIAGNOSIS — E1165 Type 2 diabetes mellitus with hyperglycemia: Secondary | ICD-10-CM | POA: Diagnosis not present

## 2016-07-23 DIAGNOSIS — R5383 Other fatigue: Secondary | ICD-10-CM | POA: Diagnosis not present

## 2016-07-23 DIAGNOSIS — R278 Other lack of coordination: Secondary | ICD-10-CM | POA: Diagnosis not present

## 2016-07-23 DIAGNOSIS — I480 Paroxysmal atrial fibrillation: Secondary | ICD-10-CM | POA: Diagnosis not present

## 2016-07-23 DIAGNOSIS — I2510 Atherosclerotic heart disease of native coronary artery without angina pectoris: Secondary | ICD-10-CM | POA: Diagnosis not present

## 2016-07-23 DIAGNOSIS — R4182 Altered mental status, unspecified: Secondary | ICD-10-CM

## 2016-07-23 DIAGNOSIS — E119 Type 2 diabetes mellitus without complications: Secondary | ICD-10-CM | POA: Insufficient documentation

## 2016-07-23 DIAGNOSIS — G4733 Obstructive sleep apnea (adult) (pediatric): Secondary | ICD-10-CM | POA: Diagnosis not present

## 2016-07-23 DIAGNOSIS — E662 Morbid (severe) obesity with alveolar hypoventilation: Secondary | ICD-10-CM | POA: Diagnosis not present

## 2016-07-23 DIAGNOSIS — J9612 Chronic respiratory failure with hypercapnia: Secondary | ICD-10-CM | POA: Diagnosis not present

## 2016-07-23 DIAGNOSIS — I5032 Chronic diastolic (congestive) heart failure: Secondary | ICD-10-CM | POA: Diagnosis not present

## 2016-07-23 DIAGNOSIS — Z79899 Other long term (current) drug therapy: Secondary | ICD-10-CM | POA: Insufficient documentation

## 2016-07-23 DIAGNOSIS — Z794 Long term (current) use of insulin: Secondary | ICD-10-CM | POA: Diagnosis not present

## 2016-07-23 DIAGNOSIS — T50902A Poisoning by unspecified drugs, medicaments and biological substances, intentional self-harm, initial encounter: Secondary | ICD-10-CM | POA: Diagnosis not present

## 2016-07-23 DIAGNOSIS — R2681 Unsteadiness on feet: Secondary | ICD-10-CM | POA: Diagnosis not present

## 2016-07-23 DIAGNOSIS — S82843A Displaced bimalleolar fracture of unspecified lower leg, initial encounter for closed fracture: Secondary | ICD-10-CM | POA: Diagnosis not present

## 2016-07-23 DIAGNOSIS — J449 Chronic obstructive pulmonary disease, unspecified: Secondary | ICD-10-CM | POA: Diagnosis not present

## 2016-07-23 DIAGNOSIS — I110 Hypertensive heart disease with heart failure: Secondary | ICD-10-CM | POA: Insufficient documentation

## 2016-07-23 LAB — CBC WITH DIFFERENTIAL/PLATELET
Basophils Absolute: 0 10*3/uL (ref 0.0–0.1)
Basophils Relative: 0 %
EOS ABS: 0.3 10*3/uL (ref 0.0–0.7)
Eosinophils Relative: 3 %
HCT: 37.2 % — ABNORMAL LOW (ref 39.0–52.0)
HEMOGLOBIN: 10.7 g/dL — AB (ref 13.0–17.0)
LYMPHS ABS: 1.8 10*3/uL (ref 0.7–4.0)
LYMPHS PCT: 15 %
MCH: 26.9 pg (ref 26.0–34.0)
MCHC: 28.8 g/dL — AB (ref 30.0–36.0)
MCV: 93.5 fL (ref 78.0–100.0)
MONOS PCT: 5 %
Monocytes Absolute: 0.6 10*3/uL (ref 0.1–1.0)
NEUTROS PCT: 77 %
Neutro Abs: 9.4 10*3/uL — ABNORMAL HIGH (ref 1.7–7.7)
Platelets: 172 10*3/uL (ref 150–400)
RBC: 3.98 MIL/uL — AB (ref 4.22–5.81)
RDW: 14.2 % (ref 11.5–15.5)
WBC: 12.2 10*3/uL — AB (ref 4.0–10.5)

## 2016-07-23 LAB — URINALYSIS, ROUTINE W REFLEX MICROSCOPIC
BILIRUBIN URINE: NEGATIVE
Glucose, UA: 100 mg/dL — AB
Hgb urine dipstick: NEGATIVE
KETONES UR: NEGATIVE mg/dL
LEUKOCYTES UA: NEGATIVE
NITRITE: NEGATIVE
PH: 5.5 (ref 5.0–8.0)
PROTEIN: NEGATIVE mg/dL
Specific Gravity, Urine: <1.005 — ABNORMAL LOW (ref 1.005–1.030)

## 2016-07-23 LAB — COMPREHENSIVE METABOLIC PANEL
ALK PHOS: 71 U/L (ref 38–126)
ALT: 10 U/L — AB (ref 17–63)
ANION GAP: 6 (ref 5–15)
AST: 12 U/L — ABNORMAL LOW (ref 15–41)
Albumin: 3.2 g/dL — ABNORMAL LOW (ref 3.5–5.0)
BILIRUBIN TOTAL: 0.4 mg/dL (ref 0.3–1.2)
BUN: 10 mg/dL (ref 6–20)
CALCIUM: 8.2 mg/dL — AB (ref 8.9–10.3)
CO2: 36 mmol/L — ABNORMAL HIGH (ref 22–32)
CREATININE: 0.82 mg/dL (ref 0.61–1.24)
Chloride: 95 mmol/L — ABNORMAL LOW (ref 101–111)
Glucose, Bld: 139 mg/dL — ABNORMAL HIGH (ref 65–99)
Potassium: 3.5 mmol/L (ref 3.5–5.1)
Sodium: 137 mmol/L (ref 135–145)
TOTAL PROTEIN: 6.2 g/dL — AB (ref 6.5–8.1)

## 2016-07-23 LAB — RAPID URINE DRUG SCREEN, HOSP PERFORMED
Amphetamines: NOT DETECTED
BARBITURATES: NOT DETECTED
BENZODIAZEPINES: NOT DETECTED
COCAINE: NOT DETECTED
Opiates: NOT DETECTED
Tetrahydrocannabinol: NOT DETECTED

## 2016-07-23 LAB — ETHANOL: Alcohol, Ethyl (B): <5 mg/dL (ref <5)

## 2016-07-23 LAB — SALICYLATE LEVEL: Salicylate Lvl: <7 mg/dL (ref 2.8–30.0)

## 2016-07-23 LAB — ACETAMINOPHEN LEVEL

## 2016-07-23 LAB — CBG MONITORING, ED: Glucose-Capillary: 98 mg/dL (ref 65–99)

## 2016-07-23 MED ORDER — ZIPRASIDONE HCL 20 MG PO CAPS: 20.0000 mg | ORAL_CAPSULE | Freq: Two times a day (BID) | ORAL | 0 refills | Status: DC

## 2016-07-23 MED ORDER — SODIUM CHLORIDE 0.9 % IV BOLUS (SEPSIS)
1000.0000 mL | Freq: Once | INTRAVENOUS | Status: AC
  Administered 2016-07-23: 1000 mL via INTRAVENOUS

## 2016-07-23 MED ORDER — CITALOPRAM HYDROBROMIDE 20 MG PO TABS: 20.0000 mg | ORAL_TABLET | Freq: Every day | ORAL | 0 refills | Status: DC

## 2016-07-23 MED ORDER — SODIUM CHLORIDE 0.9 % IV BOLUS (SEPSIS)
500.0000 mL | Freq: Once | INTRAVENOUS | Status: AC
  Administered 2016-07-23: 500 mL via INTRAVENOUS

## 2016-07-23 NOTE — ED Provider Notes (Signed)
[facility] DEPT Provider Note   CSN: [id number] Arrival date & time: 07/23/16  9833     History   Chief Complaint Chief Complaint  Patient presents with  . Drug Overdose    HPI Rick Cruz is a 70 y.o. male.  Level V caveat for altered mental status. History is rather sketchy and obtained from the patient and his mother. Patient has been off from his Geodon and Celexa for 2-3 months. He restarted the medication today on one of his doctor's recommendation. He claims to have taken Geodon 80 mg and Celexa 20 mg today.  He now feels sluggish and excessively fatigued. His mother reports altered mental status. No chest pain, dyspnea, fever, sweats, chills      Past Medical History:  Diagnosis Date  . 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 CHF (congestive heart failure) (HCC)    diastolic   . Chronic pain   . COPD (chronic obstructive pulmonary disease) ([place])   . Depression   . Diabetes mellitus without complication (HCC)    insulin pump  . Hypercholesterolemia   . On home O2    3 liters  . Schizophrenia ([place])   . Sleep apnea    noncompliant with BiPAP  . Tobacco abuse     Patient Active Problem List   Diagnosis Date Noted  . OSA (obstructive sleep apnea)   . Chronic respiratory failure with hypercapnia ([place])   . Undifferentiated schizophrenia ([place])   . Uncontrolled type 2 diabetes mellitus with complication ([name])   . Anemia of chronic disease   . Paroxysmal atrial fibrillation (HCC)   . Hyponatremia 06/12/2016  . Aspiration pneumonia ([place]) 08/16/2015  . Obesity hypoventilation syndrome ([place]) 04/05/2015  . CSA (central sleep apnea) 04/05/2015  . Non compliance with medical treatment 04/05/2015  . Acute respiratory failure with hypoxia and hypercarbia (HCC)   . Chronic diastolic CHF (congestive heart failure) ([place]) 08/06/2014  . Recurrent falls 05/08/2014  . Ankle fracture, right 05/08/2014  . Ankle fracture, left 05/08/2014  . C. difficile colitis  01/04/2014  . Delirium 01/02/2014  . Acute encephalopathy 01/02/2014  . Morbid obesity ([place]) 06/17/2013  . Erosive esophagitis 04/22/2013  . Chronic respiratory failure 02 dep with hypercarbia 04/22/2013  . Type 2 diabetes mellitus without complication, with long-term current use of insulin ([place]) 04/21/2013  . Paranoid schizophrenia ([place]) 04/18/2013  . CAD (coronary artery disease) 04/18/2013  . DYSLIPIDEMIA 08/25/2008  . Obstructive sleep apnea 08/25/2008  . Essential hypertension 08/25/2008    Past Surgical History:  Procedure Laterality Date  . COLON SURGERY  March 2010   secondary to large colon polyp, final path per discharge summary notes tubulovillous adenoma.   . COLONOSCOPY  July 2011   Dr. Benson Norway: multiple polyps, internal and external hemorrhoids, diverticulosis. tubular adenoma  . ESOPHAGOGASTRODUODENOSCOPY (EGD) WITH PROPOFOL N/A 04/20/2013   Procedure: ESOPHAGOGASTRODUODENOSCOPY (EGD) WITH PROPOFOL;  Surgeon: Daneil Dolin, MD;  Location: [facility];  Service: Endoscopy;  Laterality: N/A;  . KNEE SURGERY     X2       Home Medications    Prior to Admission medications   Medication Sig Start Date End Date Taking? Authorizing Provider  acetaminophen (TYLENOL) 325 MG tablet Take 2 tablets (650 mg total) by mouth every 6 (six) hours as needed for mild pain or fever. 05/29/16   Bonnielee Haff, MD  albuterol (PROVENTIL HFA;VENTOLIN HFA) 108 (90 BASE) MCG/ACT inhaler Inhale 2 puffs into the lungs every 6 (six) hours  as needed for wheezing or shortness of breath. 05/11/14   Radene Gunning, NP  albuterol (PROVENTIL) (2.5 MG/3ML) 0.083% nebulizer solution Take 3 mLs (2.5 mg total) by nebulization every 6 (six) hours as needed for wheezing or shortness of breath. 08/21/15   Kathie Dike, MD  apixaban (ELIQUIS) 5 MG TABS tablet Take 1 tablet (5 mg total) by mouth 2 (two) times daily. 06/15/16   Ripudeep [name], MD  bisacodyl (DULCOLAX) 10 MG suppository Place 1 suppository (10 mg total)  rectally daily. 06/16/16   Ripudeep [name], MD  cholecalciferol (VITAMIN D) 1000 UNITS tablet Take 2,000 Units by mouth daily.    Historical Provider, MD  Cinnamon 500 MG capsule Take 1,000 mg by mouth every morning.     Historical Provider, MD  diltiazem (CARDIZEM CD) 120 MG 24 hr capsule Take 1 capsule (120 mg total) by mouth daily. 06/16/16   Ripudeep [name], MD  docusate sodium (COLACE) 100 MG capsule Take 100 mg by mouth 2 (two) times daily.    Historical Provider, MD  furosemide (LASIX) 20 MG tablet Take 3 tablets (60 mg total) by mouth 2 (two) times daily. 06/15/16   Ripudeep [name], MD  gabapentin (NEURONTIN) 300 MG capsule Take 1 capsule (300 mg total) by mouth 2 (two) times daily. 05/28/16   Bonnielee Haff, MD  insulin aspart (NOVOLOG) 100 UNIT/ML injection Inject 8 Units into the skin 3 (three) times daily with meals. 06/15/16   Ripudeep [name], MD  insulin aspart (NOVOLOG) 100 UNIT/ML injection Inject 0-9 Units into the skin 3 (three) times daily with meals. Sliding scale CBG 70 - 120: 0 units CBG 121 - 150: 1 unit,  CBG 151 - 200: 2 units,  CBG 201 - 250: 3 units,  CBG 251 - 300: 5 units,  CBG 301 - 350: 7 units,  CBG 351 - 400: 9 units   CBG > 400: 9 units and notify your MD 06/15/16   Ripudeep [name], MD  insulin detemir (LEVEMIR) 100 UNIT/ML injection Inject 24 Units into the skin daily.    Historical Provider, MD  loperamide (IMODIUM) 2 MG capsule Take 1 capsule (2 mg total) by mouth 3 (three) times daily as needed for diarrhea or loose stools. 05/29/16   Bonnielee Haff, MD  metoprolol tartrate (LOPRESSOR) 25 MG tablet Take 0.5 tablets (12.5 mg total) by mouth 2 (two) times daily. 05/28/16   Bonnielee Haff, MD  pantoprazole (PROTONIX) 40 MG tablet Take 40 mg by mouth 2 (two) times daily.    Historical Provider, MD  simvastatin (ZOCOR) 40 MG tablet Take 40 mg by mouth every evening.    Historical Provider, MD  terazosin (HYTRIN) 1 MG capsule Take 1 capsule (1 mg total) by mouth at bedtime. 05/28/16    Bonnielee Haff, MD    Family History Family History  Problem Relation Age of Onset  . Thyroid disease Mother   . Parkinson's disease Father   . Parkinson's disease    . Colon cancer Neg Hx     Social History Social History  Substance Use Topics  . Smoking status: Former Smoker    Packs/day: 1.00    Years: 50.00    Types: Cigarettes    Quit date: 09/16/2013  . Smokeless tobacco: Never Used  . Alcohol use No     Comment: history of ETOH abuse in remote past, none in at least 10 years     Allergies   Phenergan [promethazine hcl]; Haldol [haloperidol lactate];  and Metformin and related   Review of Systems Review of Systems  Reason unable to perform ROS: Altered mental status.     Physical Exam Updated Vital Signs BP 123/66   Pulse 75   Resp 13   SpO2 99%   Physical Exam  Constitutional:  Initially sleepy, but arousable; less arousable over time  HENT:  Head: Normocephalic and atraumatic.  Eyes: Conjunctivae are normal.  Neck: Neck supple.  Cardiovascular: Normal rate and regular rhythm.   Pulmonary/Chest: Effort normal and breath sounds normal.  Abdominal: Soft. Bowel sounds are normal.  Musculoskeletal:  Sluggish movement initially  Neurological:  Excessively sleepy  Skin: Skin is warm and dry.  Psychiatric:  Flat affect  Nursing note and vitals reviewed.    ED Treatments / Results  Labs (all labs ordered are listed, but only abnormal results are displayed) Labs Reviewed  CBC WITH DIFFERENTIAL/PLATELET - Abnormal; Notable for the following:       Result Value   WBC 12.2 (*)    RBC 3.98 (*)    Hemoglobin 10.7 (*)    HCT 37.2 (*)    MCHC 28.8 (*)    Neutro Abs 9.4 (*)    All other components within normal limits  COMPREHENSIVE METABOLIC PANEL - Abnormal; Notable for the following:    Chloride 95 (*)    CO2 36 (*)    Glucose, Bld 139 (*)    Calcium 8.2 (*)    Total Protein 6.2 (*)    Albumin 3.2 (*)    AST 12 (*)    ALT 10 (*)    All  other components within normal limits  URINALYSIS, ROUTINE W REFLEX MICROSCOPIC (NOT AT [facility]) - Abnormal; Notable for the following:    Specific Gravity, Urine <1.005 (*)    Glucose, UA 100 (*)    All other components within normal limits  RAPID URINE DRUG SCREEN, HOSP PERFORMED  ETHANOL  SALICYLATE LEVEL  ACETAMINOPHEN LEVEL    EKG  EKG Interpretation None       Radiology Dg Chest Port 1 View  Result Date: 07/23/2016 CLINICAL DATA:  Altered mental status, overdose EXAM: PORTABLE CHEST 1 VIEW COMPARISON:  06/11/2016, 05/22/2016 FINDINGS: Stable slight elevation of the right diaphragm. Possible tiny right effusion. Stable enlarged cardiomediastinal silhouette. Mild central congestion. Mild interstitial opacities suggest mild edema. No pneumothorax. Possible old right lower rib fracture deformity. IMPRESSION: Mild cardiomegaly with central congestion, diffuse interstitial prominence suggests mild edema. Possible small right-sided pleural effusion. Electronically Signed   By: Donavan Foil M.D.   On: 07/23/2016 18:24    Procedures Procedures (including critical care time)  Medications Ordered in ED Medications  sodium chloride 0.9 % bolus 1,000 mL (not administered)  sodium chloride 0.9 % bolus 500 mL (500 mLs Intravenous New Bag/Given 07/23/16 1946)     Initial Impression / Assessment and Plan / ED Course  I have reviewed the triage vital signs and the nursing notes.  Pertinent labs & imaging results that were available during my care of the patient were reviewed by me and considered in my medical decision making (see chart for details).  Clinical Course     Presumably, patient's altered mental status is from his reinitiation of Geodon and Celexa, but I am not 100% convinced this is all of the issues.  Will admit to observation.  Final Clinical Impressions(s) / ED Diagnoses   Final diagnoses:  Altered mental status, unspecified altered mental status type    New  Prescriptions New  Prescriptions   No medications on file     Nat Christen, MD 07/23/16 2120

## 2016-07-23 NOTE — Discharge Instructions (Signed)
Stop taking the currently prescribed Geodon, 80 mg twice a day, and Celexa, 30 mg once a day.  Start the new prescriptions for Geodon 20 mg twice a day and Celexa 20 mg each day, tomorrow.

## 2016-07-23 NOTE — ED Notes (Signed)
Placed fall bracelet and socks on pt per EW

## 2016-07-23 NOTE — ED Notes (Signed)
Pt awake now. States he took 80 mg of Geodon and 30 mg of Celexa this morning at 0800. States the nurse from his insurance company talked to his doctor and said it was ok from him to start taking it again. States he was off of it for about three months. Pt states he has no plans to harm himself or others.

## 2016-07-23 NOTE — ED Provider Notes (Signed)
I was asked to see the patient, to request the hospitalist, who initially talked to Dr. Lacinda Axon about arranging for an admission. The plan that they had initially had was to get a CT scan of the head looking for acute central nervous system abnormalities. The scan has been done and returned as negative for acute findings. Nursing has noticed that the patient is more alert, and appears improved. The hospitalist, was contacted and requested another ER evaluation.  I saw the patient at 23:10 hours. Was awake, alert, calm, cooperative. He recalls taking his first dose of Geodon 80 mg, and Celexa 30 mg, this morning. He is due to take a second dose of Geodon, this evening, but has not done that yet. He states that he feels like he is at his baseline. He denies headache, dizziness, nausea, vomiting, weakness or paresthesia. He is oxygen, by nasal cannula at home most of the time. Patient feels like he can go home and states that his wife will come and get him.  Results for orders placed or performed during the hospital encounter of 07/23/16  CBC with Differential  Result Value Ref Range   WBC 12.2 (H) 4.0 - 10.5 K/uL   RBC 3.98 (L) 4.22 - 5.81 MIL/uL   Hemoglobin 10.7 (L) 13.0 - 17.0 g/dL   HCT 37.2 (L) 39.0 - 52.0 %   MCV 93.5 78.0 - 100.0 fL   MCH 26.9 26.0 - 34.0 pg   MCHC 28.8 (L) 30.0 - 36.0 g/dL   RDW 14.2 11.5 - 15.5 %   Platelets 172 150 - 400 K/uL   Neutrophils Relative % 77 %   Neutro Abs 9.4 (H) 1.7 - 7.7 K/uL   Lymphocytes Relative 15 %   Lymphs Abs 1.8 0.7 - 4.0 K/uL   Monocytes Relative 5 %   Monocytes Absolute 0.6 0.1 - 1.0 K/uL   Eosinophils Relative 3 %   Eosinophils Absolute 0.3 0.0 - 0.7 K/uL   Basophils Relative 0 %   Basophils Absolute 0.0 0.0 - 0.1 K/uL  Comprehensive metabolic panel  Result Value Ref Range   Sodium 137 135 - 145 mmol/L   Potassium 3.5 3.5 - 5.1 mmol/L   Chloride 95 (L) 101 - 111 mmol/L   CO2 36 (H) 22 - 32 mmol/L   Glucose, Bld 139 (H) 65 - 99 mg/dL    BUN 10 6 - 20 mg/dL   Creatinine, Ser 0.82 0.61 - 1.24 mg/dL   Calcium 8.2 (L) 8.9 - 10.3 mg/dL   Total Protein 6.2 (L) 6.5 - 8.1 g/dL   Albumin 3.2 (L) 3.5 - 5.0 g/dL   AST 12 (L) 15 - 41 U/L   ALT 10 (L) 17 - 63 U/L   Alkaline Phosphatase 71 38 - 126 U/L   Total Bilirubin 0.4 0.3 - 1.2 mg/dL   GFR calc non Af Amer >60 >60 mL/min   GFR calc Af Amer >60 >60 mL/min   Anion gap 6 5 - 15  Urinalysis, Routine w reflex microscopic  Result Value Ref Range   Color, Urine YELLOW YELLOW   APPearance CLEAR CLEAR   Specific Gravity, Urine <1.005 (L) 1.005 - 1.030   pH 5.5 5.0 - 8.0   Glucose, UA 100 (A) NEGATIVE mg/dL   Hgb urine dipstick NEGATIVE NEGATIVE   Bilirubin Urine NEGATIVE NEGATIVE   Ketones, ur NEGATIVE NEGATIVE mg/dL   Protein, ur NEGATIVE NEGATIVE mg/dL   Nitrite NEGATIVE NEGATIVE   Leukocytes, UA NEGATIVE NEGATIVE  Urine rapid  drug screen (hosp performed)  Result Value Ref Range   Opiates NONE DETECTED NONE DETECTED   Cocaine NONE DETECTED NONE DETECTED   Benzodiazepines NONE DETECTED NONE DETECTED   Amphetamines NONE DETECTED NONE DETECTED   Tetrahydrocannabinol NONE DETECTED NONE DETECTED   Barbiturates NONE DETECTED NONE DETECTED  Ethanol  Result Value Ref Range   Alcohol, Ethyl (B) <5 <5 mg/dL  Salicylate level  Result Value Ref Range   Salicylate Lvl <3.8 2.8 - 30.0 mg/dL  Acetaminophen level  Result Value Ref Range   Acetaminophen (Tylenol), Serum <10 (L) 10 - 30 ug/mL  CBG monitoring, ED  Result Value Ref Range   Glucose-Capillary 98 65 - 99 mg/dL    Ct Head Wo Contrast  Result Date: 07/23/2016 CLINICAL DATA:  Altered mental status today. EXAM: CT HEAD WITHOUT CONTRAST TECHNIQUE: Contiguous axial images were obtained from the base of the skull through the vertex without intravenous contrast. COMPARISON:  05/25/2016 FINDINGS: Brain: No evidence of acute infarction, hemorrhage, hydrocephalus, extra-axial collection or mass lesion/mass effect. Diffuse cerebral  atrophy. Mild ventricular dilatation consistent with central atrophy. Low-attenuation changes in the deep white matter consistent with small vessel ischemia. Vascular: No hyperdense vessel or unexpected calcification. Skull: Normal. Negative for fracture or focal lesion. Sinuses/Orbits: No acute finding. Other: None. IMPRESSION: No acute intracranial abnormalities. Chronic atrophy and small vessel ischemic changes. Electronically Signed   By: Lucienne Capers M.D.   On: 07/23/2016 22:16   Dg Chest Port 1 View  Result Date: 07/23/2016 CLINICAL DATA:  Altered mental status, overdose EXAM: PORTABLE CHEST 1 VIEW COMPARISON:  06/11/2016, 05/22/2016 FINDINGS: Stable slight elevation of the right diaphragm. Possible tiny right effusion. Stable enlarged cardiomediastinal silhouette. Mild central congestion. Mild interstitial opacities suggest mild edema. No pneumothorax. Possible old right lower rib fracture deformity. IMPRESSION: Mild cardiomegaly with central congestion, diffuse interstitial prominence suggests mild edema. Possible small right-sided pleural effusion. Electronically Signed   By: Donavan Foil M.D.   On: 07/23/2016 18:24    Medical decision making- lethargy associated with restarting new and high-dose of medications for psychiatric illness. He previously been on these doses, but they are much higher than typical starting dose is, and he had a holiday from the medications for several months. Therefore, I believe that his lethargy which now appears to be transient, is related to starting these medications, uniquely. Screening evaluation for other toxic metabolic infectious processes, are all negative. Head CT does not indicate acute CVA.  Plan- take Geodon 20 mg twice a day and Celexa 20 mg, daily. Recommend follow-up with prescribing physician as soon as possible to discuss further treatment and possibly advancing the medications, if needed.   Daleen Bo, MD 07/23/16 [phone]

## 2016-07-23 NOTE — ED Triage Notes (Signed)
Per EMS, family stated pt has been taking to much Deodon. States he had a son that died a few weeks ago and pt has started take more than needed.

## 2016-07-24 NOTE — ED Notes (Signed)
After CT, patient became alert and oriented x4, did not meet inpatient criteria, wheeled chair to car, accompanied by wife for discharge, no distress noted, VSS.

## 2016-07-31 DIAGNOSIS — E1165 Type 2 diabetes mellitus with hyperglycemia: Secondary | ICD-10-CM | POA: Diagnosis not present

## 2016-07-31 DIAGNOSIS — I10 Essential (primary) hypertension: Secondary | ICD-10-CM | POA: Diagnosis not present

## 2016-07-31 DIAGNOSIS — E559 Vitamin D deficiency, unspecified: Secondary | ICD-10-CM | POA: Diagnosis not present

## 2016-07-31 DIAGNOSIS — K219 Gastro-esophageal reflux disease without esophagitis: Secondary | ICD-10-CM | POA: Diagnosis not present

## 2016-07-31 DIAGNOSIS — M545 Low back pain: Secondary | ICD-10-CM | POA: Diagnosis not present

## 2016-07-31 DIAGNOSIS — I482 Chronic atrial fibrillation: Secondary | ICD-10-CM | POA: Diagnosis not present

## 2016-07-31 DIAGNOSIS — G4700 Insomnia, unspecified: Secondary | ICD-10-CM | POA: Diagnosis not present

## 2016-07-31 DIAGNOSIS — M542 Cervicalgia: Secondary | ICD-10-CM | POA: Diagnosis not present

## 2016-08-05 DIAGNOSIS — J9620 Acute and chronic respiratory failure, unspecified whether with hypoxia or hypercapnia: Secondary | ICD-10-CM | POA: Diagnosis not present

## 2016-08-05 DIAGNOSIS — J449 Chronic obstructive pulmonary disease, unspecified: Secondary | ICD-10-CM | POA: Diagnosis not present

## 2016-08-08 DIAGNOSIS — E559 Vitamin D deficiency, unspecified: Secondary | ICD-10-CM | POA: Diagnosis not present

## 2016-08-08 DIAGNOSIS — F329 Major depressive disorder, single episode, unspecified: Secondary | ICD-10-CM | POA: Diagnosis not present

## 2016-08-08 DIAGNOSIS — K219 Gastro-esophageal reflux disease without esophagitis: Secondary | ICD-10-CM | POA: Diagnosis not present

## 2016-08-08 DIAGNOSIS — I4891 Unspecified atrial fibrillation: Secondary | ICD-10-CM | POA: Diagnosis not present

## 2016-08-08 DIAGNOSIS — I10 Essential (primary) hypertension: Secondary | ICD-10-CM | POA: Diagnosis not present

## 2016-08-08 DIAGNOSIS — E119 Type 2 diabetes mellitus without complications: Secondary | ICD-10-CM | POA: Diagnosis not present

## 2016-08-10 DIAGNOSIS — I2510 Atherosclerotic heart disease of native coronary artery without angina pectoris: Secondary | ICD-10-CM | POA: Diagnosis not present

## 2016-08-10 DIAGNOSIS — I110 Hypertensive heart disease with heart failure: Secondary | ICD-10-CM | POA: Diagnosis not present

## 2016-08-10 DIAGNOSIS — E662 Morbid (severe) obesity with alveolar hypoventilation: Secondary | ICD-10-CM | POA: Diagnosis not present

## 2016-08-10 DIAGNOSIS — R278 Other lack of coordination: Secondary | ICD-10-CM | POA: Diagnosis not present

## 2016-08-10 DIAGNOSIS — E1165 Type 2 diabetes mellitus with hyperglycemia: Secondary | ICD-10-CM | POA: Diagnosis not present

## 2016-08-10 DIAGNOSIS — R2681 Unsteadiness on feet: Secondary | ICD-10-CM | POA: Diagnosis not present

## 2016-08-10 DIAGNOSIS — I480 Paroxysmal atrial fibrillation: Secondary | ICD-10-CM | POA: Diagnosis not present

## 2016-08-10 DIAGNOSIS — J9612 Chronic respiratory failure with hypercapnia: Secondary | ICD-10-CM | POA: Diagnosis not present

## 2016-08-10 DIAGNOSIS — J449 Chronic obstructive pulmonary disease, unspecified: Secondary | ICD-10-CM | POA: Diagnosis not present

## 2016-08-10 DIAGNOSIS — I5032 Chronic diastolic (congestive) heart failure: Secondary | ICD-10-CM | POA: Diagnosis not present

## 2016-08-20 DIAGNOSIS — R2681 Unsteadiness on feet: Secondary | ICD-10-CM | POA: Diagnosis not present

## 2016-08-20 DIAGNOSIS — R278 Other lack of coordination: Secondary | ICD-10-CM | POA: Diagnosis not present

## 2016-08-20 DIAGNOSIS — J9612 Chronic respiratory failure with hypercapnia: Secondary | ICD-10-CM | POA: Diagnosis not present

## 2016-08-20 DIAGNOSIS — J449 Chronic obstructive pulmonary disease, unspecified: Secondary | ICD-10-CM | POA: Diagnosis not present

## 2016-08-20 DIAGNOSIS — E662 Morbid (severe) obesity with alveolar hypoventilation: Secondary | ICD-10-CM | POA: Diagnosis not present

## 2016-08-20 DIAGNOSIS — E1165 Type 2 diabetes mellitus with hyperglycemia: Secondary | ICD-10-CM | POA: Diagnosis not present

## 2016-08-20 DIAGNOSIS — I480 Paroxysmal atrial fibrillation: Secondary | ICD-10-CM | POA: Diagnosis not present

## 2016-08-20 DIAGNOSIS — M792 Neuralgia and neuritis, unspecified: Secondary | ICD-10-CM | POA: Diagnosis not present

## 2016-08-20 DIAGNOSIS — E119 Type 2 diabetes mellitus without complications: Secondary | ICD-10-CM | POA: Diagnosis not present

## 2016-08-20 DIAGNOSIS — I2510 Atherosclerotic heart disease of native coronary artery without angina pectoris: Secondary | ICD-10-CM | POA: Diagnosis not present

## 2016-08-20 DIAGNOSIS — I110 Hypertensive heart disease with heart failure: Secondary | ICD-10-CM | POA: Diagnosis not present

## 2016-08-20 DIAGNOSIS — I5032 Chronic diastolic (congestive) heart failure: Secondary | ICD-10-CM | POA: Diagnosis not present

## 2016-08-22 DIAGNOSIS — G4733 Obstructive sleep apnea (adult) (pediatric): Secondary | ICD-10-CM | POA: Diagnosis not present

## 2016-08-22 DIAGNOSIS — J449 Chronic obstructive pulmonary disease, unspecified: Secondary | ICD-10-CM | POA: Diagnosis not present

## 2016-08-22 DIAGNOSIS — S82843A Displaced bimalleolar fracture of unspecified lower leg, initial encounter for closed fracture: Secondary | ICD-10-CM | POA: Diagnosis not present

## 2016-08-27 DIAGNOSIS — E119 Type 2 diabetes mellitus without complications: Secondary | ICD-10-CM | POA: Diagnosis not present

## 2016-08-27 DIAGNOSIS — F329 Major depressive disorder, single episode, unspecified: Secondary | ICD-10-CM | POA: Diagnosis not present

## 2016-08-27 DIAGNOSIS — M542 Cervicalgia: Secondary | ICD-10-CM | POA: Diagnosis not present

## 2016-08-27 DIAGNOSIS — R0681 Apnea, not elsewhere classified: Secondary | ICD-10-CM | POA: Diagnosis not present

## 2016-08-28 DIAGNOSIS — J9612 Chronic respiratory failure with hypercapnia: Secondary | ICD-10-CM | POA: Diagnosis not present

## 2016-08-28 DIAGNOSIS — I480 Paroxysmal atrial fibrillation: Secondary | ICD-10-CM | POA: Diagnosis not present

## 2016-08-28 DIAGNOSIS — E662 Morbid (severe) obesity with alveolar hypoventilation: Secondary | ICD-10-CM | POA: Diagnosis not present

## 2016-08-28 DIAGNOSIS — I110 Hypertensive heart disease with heart failure: Secondary | ICD-10-CM | POA: Diagnosis not present

## 2016-08-28 DIAGNOSIS — E1165 Type 2 diabetes mellitus with hyperglycemia: Secondary | ICD-10-CM | POA: Diagnosis not present

## 2016-08-28 DIAGNOSIS — I2510 Atherosclerotic heart disease of native coronary artery without angina pectoris: Secondary | ICD-10-CM | POA: Diagnosis not present

## 2016-08-28 DIAGNOSIS — I5032 Chronic diastolic (congestive) heart failure: Secondary | ICD-10-CM | POA: Diagnosis not present

## 2016-08-28 DIAGNOSIS — R2681 Unsteadiness on feet: Secondary | ICD-10-CM | POA: Diagnosis not present

## 2016-08-28 DIAGNOSIS — J449 Chronic obstructive pulmonary disease, unspecified: Secondary | ICD-10-CM | POA: Diagnosis not present

## 2016-08-28 DIAGNOSIS — R278 Other lack of coordination: Secondary | ICD-10-CM | POA: Diagnosis not present

## 2016-08-30 ENCOUNTER — Telehealth: Payer: Self-pay | Admitting: *Deleted

## 2016-08-30 NOTE — Telephone Encounter (Signed)
Patient called to confirm his FU with Dr Brett Fairy in Feb. He then stated he might need to see her sooner. He stated he is having problems with his ventilator machine, and he is not sure if he needs another sleep study.  Advised that Dr Brett Fairy may not be able to help with his machine, but the company that issued it may. Advised he should talk to Dr Dohmeier's RN before his appointment is moved sooner. Please call and advise.

## 2016-08-30 NOTE — Telephone Encounter (Signed)
I called but no answer and no vm. If patient is having trouble with his BiPAP, he can contact his DME company for help. Also if this is f/u for same problem, can be seen by [facility] NP.

## 2016-09-03 DIAGNOSIS — I5032 Chronic diastolic (congestive) heart failure: Secondary | ICD-10-CM | POA: Diagnosis not present

## 2016-09-03 DIAGNOSIS — J449 Chronic obstructive pulmonary disease, unspecified: Secondary | ICD-10-CM | POA: Diagnosis not present

## 2016-09-03 DIAGNOSIS — I110 Hypertensive heart disease with heart failure: Secondary | ICD-10-CM | POA: Diagnosis not present

## 2016-09-03 DIAGNOSIS — I480 Paroxysmal atrial fibrillation: Secondary | ICD-10-CM | POA: Diagnosis not present

## 2016-09-03 DIAGNOSIS — E662 Morbid (severe) obesity with alveolar hypoventilation: Secondary | ICD-10-CM | POA: Diagnosis not present

## 2016-09-03 DIAGNOSIS — I2510 Atherosclerotic heart disease of native coronary artery without angina pectoris: Secondary | ICD-10-CM | POA: Diagnosis not present

## 2016-09-03 DIAGNOSIS — J9612 Chronic respiratory failure with hypercapnia: Secondary | ICD-10-CM | POA: Diagnosis not present

## 2016-09-03 DIAGNOSIS — E1165 Type 2 diabetes mellitus with hyperglycemia: Secondary | ICD-10-CM | POA: Diagnosis not present

## 2016-09-03 DIAGNOSIS — R278 Other lack of coordination: Secondary | ICD-10-CM | POA: Diagnosis not present

## 2016-09-03 DIAGNOSIS — R2681 Unsteadiness on feet: Secondary | ICD-10-CM | POA: Diagnosis not present

## 2016-09-04 DIAGNOSIS — J9620 Acute and chronic respiratory failure, unspecified whether with hypoxia or hypercapnia: Secondary | ICD-10-CM | POA: Diagnosis not present

## 2016-09-04 DIAGNOSIS — J449 Chronic obstructive pulmonary disease, unspecified: Secondary | ICD-10-CM | POA: Diagnosis not present

## 2016-09-05 DIAGNOSIS — E1165 Type 2 diabetes mellitus with hyperglycemia: Secondary | ICD-10-CM | POA: Diagnosis not present

## 2016-09-05 DIAGNOSIS — R2681 Unsteadiness on feet: Secondary | ICD-10-CM | POA: Diagnosis not present

## 2016-09-05 DIAGNOSIS — E662 Morbid (severe) obesity with alveolar hypoventilation: Secondary | ICD-10-CM | POA: Diagnosis not present

## 2016-09-05 DIAGNOSIS — R278 Other lack of coordination: Secondary | ICD-10-CM | POA: Diagnosis not present

## 2016-09-05 DIAGNOSIS — J9612 Chronic respiratory failure with hypercapnia: Secondary | ICD-10-CM | POA: Diagnosis not present

## 2016-09-05 DIAGNOSIS — I2510 Atherosclerotic heart disease of native coronary artery without angina pectoris: Secondary | ICD-10-CM | POA: Diagnosis not present

## 2016-09-05 DIAGNOSIS — I480 Paroxysmal atrial fibrillation: Secondary | ICD-10-CM | POA: Diagnosis not present

## 2016-09-05 DIAGNOSIS — I5032 Chronic diastolic (congestive) heart failure: Secondary | ICD-10-CM | POA: Diagnosis not present

## 2016-09-05 DIAGNOSIS — J449 Chronic obstructive pulmonary disease, unspecified: Secondary | ICD-10-CM | POA: Diagnosis not present

## 2016-09-05 DIAGNOSIS — I110 Hypertensive heart disease with heart failure: Secondary | ICD-10-CM | POA: Diagnosis not present

## 2016-09-18 DIAGNOSIS — M542 Cervicalgia: Secondary | ICD-10-CM | POA: Diagnosis not present

## 2016-09-22 DIAGNOSIS — J449 Chronic obstructive pulmonary disease, unspecified: Secondary | ICD-10-CM | POA: Diagnosis not present

## 2016-09-22 DIAGNOSIS — S82843A Displaced bimalleolar fracture of unspecified lower leg, initial encounter for closed fracture: Secondary | ICD-10-CM | POA: Diagnosis not present

## 2016-09-22 DIAGNOSIS — G4733 Obstructive sleep apnea (adult) (pediatric): Secondary | ICD-10-CM | POA: Diagnosis not present

## 2016-09-25 DIAGNOSIS — J449 Chronic obstructive pulmonary disease, unspecified: Secondary | ICD-10-CM | POA: Diagnosis not present

## 2016-09-25 DIAGNOSIS — R05 Cough: Secondary | ICD-10-CM | POA: Diagnosis not present

## 2016-09-25 DIAGNOSIS — L603 Nail dystrophy: Secondary | ICD-10-CM | POA: Diagnosis not present

## 2016-09-25 DIAGNOSIS — I10 Essential (primary) hypertension: Secondary | ICD-10-CM | POA: Diagnosis not present

## 2016-09-25 DIAGNOSIS — E1142 Type 2 diabetes mellitus with diabetic polyneuropathy: Secondary | ICD-10-CM | POA: Diagnosis not present

## 2016-09-25 DIAGNOSIS — E119 Type 2 diabetes mellitus without complications: Secondary | ICD-10-CM | POA: Diagnosis not present

## 2016-10-02 DIAGNOSIS — E119 Type 2 diabetes mellitus without complications: Secondary | ICD-10-CM | POA: Diagnosis not present

## 2016-10-05 DIAGNOSIS — J449 Chronic obstructive pulmonary disease, unspecified: Secondary | ICD-10-CM | POA: Diagnosis not present

## 2016-10-05 DIAGNOSIS — J9620 Acute and chronic respiratory failure, unspecified whether with hypoxia or hypercapnia: Secondary | ICD-10-CM | POA: Diagnosis not present

## 2016-10-09 DIAGNOSIS — E1165 Type 2 diabetes mellitus with hyperglycemia: Secondary | ICD-10-CM | POA: Diagnosis not present

## 2016-10-09 DIAGNOSIS — G894 Chronic pain syndrome: Secondary | ICD-10-CM | POA: Diagnosis not present

## 2016-10-09 DIAGNOSIS — J449 Chronic obstructive pulmonary disease, unspecified: Secondary | ICD-10-CM | POA: Diagnosis not present

## 2016-10-09 DIAGNOSIS — I5032 Chronic diastolic (congestive) heart failure: Secondary | ICD-10-CM | POA: Diagnosis not present

## 2016-10-09 DIAGNOSIS — F200 Paranoid schizophrenia: Secondary | ICD-10-CM | POA: Diagnosis not present

## 2016-10-09 DIAGNOSIS — M542 Cervicalgia: Secondary | ICD-10-CM | POA: Diagnosis not present

## 2016-10-09 DIAGNOSIS — R2681 Unsteadiness on feet: Secondary | ICD-10-CM | POA: Diagnosis not present

## 2016-10-09 DIAGNOSIS — I110 Hypertensive heart disease with heart failure: Secondary | ICD-10-CM | POA: Diagnosis not present

## 2016-10-09 DIAGNOSIS — J9612 Chronic respiratory failure with hypercapnia: Secondary | ICD-10-CM | POA: Diagnosis not present

## 2016-10-09 DIAGNOSIS — I2510 Atherosclerotic heart disease of native coronary artery without angina pectoris: Secondary | ICD-10-CM | POA: Diagnosis not present

## 2016-10-10 DIAGNOSIS — E1142 Type 2 diabetes mellitus with diabetic polyneuropathy: Secondary | ICD-10-CM | POA: Diagnosis not present

## 2016-10-11 DIAGNOSIS — I2510 Atherosclerotic heart disease of native coronary artery without angina pectoris: Secondary | ICD-10-CM | POA: Diagnosis not present

## 2016-10-11 DIAGNOSIS — G894 Chronic pain syndrome: Secondary | ICD-10-CM | POA: Diagnosis not present

## 2016-10-11 DIAGNOSIS — E1165 Type 2 diabetes mellitus with hyperglycemia: Secondary | ICD-10-CM | POA: Diagnosis not present

## 2016-10-11 DIAGNOSIS — R2681 Unsteadiness on feet: Secondary | ICD-10-CM | POA: Diagnosis not present

## 2016-10-11 DIAGNOSIS — J9612 Chronic respiratory failure with hypercapnia: Secondary | ICD-10-CM | POA: Diagnosis not present

## 2016-10-11 DIAGNOSIS — I5032 Chronic diastolic (congestive) heart failure: Secondary | ICD-10-CM | POA: Diagnosis not present

## 2016-10-11 DIAGNOSIS — J449 Chronic obstructive pulmonary disease, unspecified: Secondary | ICD-10-CM | POA: Diagnosis not present

## 2016-10-11 DIAGNOSIS — F200 Paranoid schizophrenia: Secondary | ICD-10-CM | POA: Diagnosis not present

## 2016-10-11 DIAGNOSIS — M542 Cervicalgia: Secondary | ICD-10-CM | POA: Diagnosis not present

## 2016-10-11 DIAGNOSIS — I110 Hypertensive heart disease with heart failure: Secondary | ICD-10-CM | POA: Diagnosis not present

## 2016-10-15 DIAGNOSIS — Z9842 Cataract extraction status, left eye: Secondary | ICD-10-CM | POA: Diagnosis not present

## 2016-10-15 DIAGNOSIS — H04123 Dry eye syndrome of bilateral lacrimal glands: Secondary | ICD-10-CM | POA: Diagnosis not present

## 2016-10-15 DIAGNOSIS — H35033 Hypertensive retinopathy, bilateral: Secondary | ICD-10-CM | POA: Diagnosis not present

## 2016-10-15 DIAGNOSIS — Z961 Presence of intraocular lens: Secondary | ICD-10-CM | POA: Diagnosis not present

## 2016-10-15 DIAGNOSIS — H16223 Keratoconjunctivitis sicca, not specified as Sjogren's, bilateral: Secondary | ICD-10-CM | POA: Diagnosis not present

## 2016-10-15 DIAGNOSIS — E119 Type 2 diabetes mellitus without complications: Secondary | ICD-10-CM | POA: Diagnosis not present

## 2016-10-15 DIAGNOSIS — I10 Essential (primary) hypertension: Secondary | ICD-10-CM | POA: Diagnosis not present

## 2016-10-15 DIAGNOSIS — H34231 Retinal artery branch occlusion, right eye: Secondary | ICD-10-CM | POA: Diagnosis not present

## 2016-10-15 DIAGNOSIS — H353121 Nonexudative age-related macular degeneration, left eye, early dry stage: Secondary | ICD-10-CM | POA: Diagnosis not present

## 2016-10-15 DIAGNOSIS — H43393 Other vitreous opacities, bilateral: Secondary | ICD-10-CM | POA: Diagnosis not present

## 2016-10-15 DIAGNOSIS — H26491 Other secondary cataract, right eye: Secondary | ICD-10-CM | POA: Diagnosis not present

## 2016-10-15 DIAGNOSIS — H52222 Regular astigmatism, left eye: Secondary | ICD-10-CM | POA: Diagnosis not present

## 2016-10-18 DIAGNOSIS — M542 Cervicalgia: Secondary | ICD-10-CM | POA: Diagnosis not present

## 2016-10-18 DIAGNOSIS — I110 Hypertensive heart disease with heart failure: Secondary | ICD-10-CM | POA: Diagnosis not present

## 2016-10-18 DIAGNOSIS — F200 Paranoid schizophrenia: Secondary | ICD-10-CM | POA: Diagnosis not present

## 2016-10-18 DIAGNOSIS — I5032 Chronic diastolic (congestive) heart failure: Secondary | ICD-10-CM | POA: Diagnosis not present

## 2016-10-18 DIAGNOSIS — R2681 Unsteadiness on feet: Secondary | ICD-10-CM | POA: Diagnosis not present

## 2016-10-18 DIAGNOSIS — G894 Chronic pain syndrome: Secondary | ICD-10-CM | POA: Diagnosis not present

## 2016-10-18 DIAGNOSIS — I2510 Atherosclerotic heart disease of native coronary artery without angina pectoris: Secondary | ICD-10-CM | POA: Diagnosis not present

## 2016-10-18 DIAGNOSIS — E1165 Type 2 diabetes mellitus with hyperglycemia: Secondary | ICD-10-CM | POA: Diagnosis not present

## 2016-10-18 DIAGNOSIS — J9612 Chronic respiratory failure with hypercapnia: Secondary | ICD-10-CM | POA: Diagnosis not present

## 2016-10-18 DIAGNOSIS — J449 Chronic obstructive pulmonary disease, unspecified: Secondary | ICD-10-CM | POA: Diagnosis not present

## 2016-10-23 DIAGNOSIS — J449 Chronic obstructive pulmonary disease, unspecified: Secondary | ICD-10-CM | POA: Diagnosis not present

## 2016-10-23 DIAGNOSIS — G4733 Obstructive sleep apnea (adult) (pediatric): Secondary | ICD-10-CM | POA: Diagnosis not present

## 2016-10-23 DIAGNOSIS — S82843A Displaced bimalleolar fracture of unspecified lower leg, initial encounter for closed fracture: Secondary | ICD-10-CM | POA: Diagnosis not present

## 2016-10-25 ENCOUNTER — Ambulatory Visit: Payer: PPO | Admitting: Neurology

## 2016-10-30 DIAGNOSIS — J449 Chronic obstructive pulmonary disease, unspecified: Secondary | ICD-10-CM | POA: Diagnosis not present

## 2016-10-30 DIAGNOSIS — E7800 Pure hypercholesterolemia, unspecified: Secondary | ICD-10-CM | POA: Diagnosis not present

## 2016-10-30 DIAGNOSIS — E119 Type 2 diabetes mellitus without complications: Secondary | ICD-10-CM | POA: Diagnosis not present

## 2016-10-30 DIAGNOSIS — I10 Essential (primary) hypertension: Secondary | ICD-10-CM | POA: Diagnosis not present

## 2016-11-05 DIAGNOSIS — J9620 Acute and chronic respiratory failure, unspecified whether with hypoxia or hypercapnia: Secondary | ICD-10-CM | POA: Diagnosis not present

## 2016-11-05 DIAGNOSIS — J449 Chronic obstructive pulmonary disease, unspecified: Secondary | ICD-10-CM | POA: Diagnosis not present

## 2016-11-20 DIAGNOSIS — G4733 Obstructive sleep apnea (adult) (pediatric): Secondary | ICD-10-CM | POA: Diagnosis not present

## 2016-11-20 DIAGNOSIS — J449 Chronic obstructive pulmonary disease, unspecified: Secondary | ICD-10-CM | POA: Diagnosis not present

## 2016-11-20 DIAGNOSIS — S82843A Displaced bimalleolar fracture of unspecified lower leg, initial encounter for closed fracture: Secondary | ICD-10-CM | POA: Diagnosis not present

## 2016-11-21 DIAGNOSIS — E119 Type 2 diabetes mellitus without complications: Secondary | ICD-10-CM | POA: Diagnosis not present

## 2016-12-03 DIAGNOSIS — J449 Chronic obstructive pulmonary disease, unspecified: Secondary | ICD-10-CM | POA: Diagnosis not present

## 2016-12-03 DIAGNOSIS — J9620 Acute and chronic respiratory failure, unspecified whether with hypoxia or hypercapnia: Secondary | ICD-10-CM | POA: Diagnosis not present

## 2016-12-11 DIAGNOSIS — I10 Essential (primary) hypertension: Secondary | ICD-10-CM | POA: Diagnosis not present

## 2016-12-11 DIAGNOSIS — R635 Abnormal weight gain: Secondary | ICD-10-CM | POA: Diagnosis not present

## 2016-12-11 DIAGNOSIS — R0602 Shortness of breath: Secondary | ICD-10-CM | POA: Diagnosis not present

## 2016-12-11 DIAGNOSIS — R42 Dizziness and giddiness: Secondary | ICD-10-CM | POA: Diagnosis not present

## 2016-12-18 DIAGNOSIS — L603 Nail dystrophy: Secondary | ICD-10-CM | POA: Diagnosis not present

## 2016-12-18 DIAGNOSIS — L851 Acquired keratosis [keratoderma] palmaris et plantaris: Secondary | ICD-10-CM | POA: Diagnosis not present

## 2016-12-18 DIAGNOSIS — E1142 Type 2 diabetes mellitus with diabetic polyneuropathy: Secondary | ICD-10-CM | POA: Diagnosis not present

## 2016-12-21 ENCOUNTER — Encounter: Payer: Self-pay | Admitting: Neurology

## 2016-12-21 ENCOUNTER — Ambulatory Visit (INDEPENDENT_AMBULATORY_CARE_PROVIDER_SITE_OTHER): Payer: PPO | Admitting: Neurology

## 2016-12-21 VITALS — BP 120/80 | HR 78 | Ht 72.0 in | Wt 302.4 lb

## 2016-12-21 DIAGNOSIS — J449 Chronic obstructive pulmonary disease, unspecified: Secondary | ICD-10-CM | POA: Diagnosis not present

## 2016-12-21 DIAGNOSIS — R519 Headache, unspecified: Secondary | ICD-10-CM

## 2016-12-21 DIAGNOSIS — R51 Headache: Secondary | ICD-10-CM

## 2016-12-21 DIAGNOSIS — E0842 Diabetes mellitus due to underlying condition with diabetic polyneuropathy: Secondary | ICD-10-CM | POA: Diagnosis not present

## 2016-12-21 DIAGNOSIS — G4733 Obstructive sleep apnea (adult) (pediatric): Secondary | ICD-10-CM | POA: Diagnosis not present

## 2016-12-21 DIAGNOSIS — Z79899 Other long term (current) drug therapy: Secondary | ICD-10-CM

## 2016-12-21 DIAGNOSIS — S82843A Displaced bimalleolar fracture of unspecified lower leg, initial encounter for closed fracture: Secondary | ICD-10-CM | POA: Diagnosis not present

## 2016-12-21 DIAGNOSIS — R42 Dizziness and giddiness: Secondary | ICD-10-CM | POA: Diagnosis not present

## 2016-12-21 NOTE — Patient Instructions (Addendum)
1.  Reduce gabapentin to 300mg  as follows:    Morning       Afternoon          Evening  Week 1 1 tab                1 tab          1 tab              Week 2 1 tab                   1 tab              Week 3            1 tab         If your pain gets worse, restart at the dose that controlled symptoms.  2.  Stop meclizine  3.  Encouraged to stay active and do chair exercises  4.  Discuss with your primary care provider about your other medications possibly contributing to your lightheadedness.

## 2016-12-21 NOTE — Progress Notes (Signed)
[facility] Clinic Note - Initial Visit   Date: 12/21/16  Rick Cruz MRN: [id number] DOB: [birth year]   Dear Dr. [name]:  Thank you for your kind referral of Rick Cruz for consultation of dizziness. Although his history is well known to you, please allow [place] to reiterate it for the purpose of our medical record. The patient was accompanied to the clinic by wife who also provides collateral information.     History of Present Illness: Rick Cruz is a 71 y.o. Caucasian male with CHF, central sleep apnea (followed at [facility]), COPD, depression, insulin-dependent diabetes mellitus, schizophrenia, hyperlipidemia, peripheral arterial disease, hypertension, atrial fibrillation presenting for evaluation of dizziness.  Patient arrived 30-min late to his appointment today.  He has a complex medical history with multiple comorbidities.  Starting about 3-4 weeks ago, he began having spells of lightheadness, described as "woosy".  There is no room-spinning, nausea, or vomiting.  Abrupt head movement does not exacerbate symptoms, however he endorses that positional changes, such as getting up from a chair or out of bed triggers lightheadedness.  He is on a number of medications and denies any recent changes.  He saw his PCP for these complaints, who check CBC, CMP, and offered a trial of meclizine.  Patient has been taking meclizine 12.5mg  but denies any marked change.  Wife states that his symptoms are not constant and if he is resting, then he rarely complains of lightheadedness.    He also complains of headaches several weeks ago.  Pain is throbbing and constant.  It takes Aleve which provides some relief.  He is on 3L home oxygen and should be using a non-invasive ventilatory support at night, but is noncompliant with this.  His headaches are worse in the morning.  He also has neck pain and known degenerative changes.  Robaxin was given, however, this made his extremely  sleepy so he does not take this any longer.  Wife is concerned about the number of medications that he takes, including that for pain because she said that he has shown addictive tendencies in the past.   He had diabetic neuropathy of the feet and takes gabapentin 300mg  four times daily.  Upon questioning his symptoms, he endorses more numbness than pain.  He denies any weakness of the legs, but overall feels weak and gets short of breath so uses a walker for ambulation.   Past Medical History:  Diagnosis Date  . 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 CHF (congestive heart failure) (HCC)    diastolic   . Chronic pain   . COPD (chronic obstructive pulmonary disease) ([place])   . Depression   . Diabetes mellitus without complication (HCC)    insulin pump  . Hypercholesterolemia   . On home O2    3 liters  . Schizophrenia ([place])   . Sleep apnea    noncompliant with BiPAP  . Tobacco abuse     Past Surgical History:  Procedure Laterality Date  . COLON SURGERY  March 2010   secondary to large colon polyp, final path per discharge summary notes tubulovillous adenoma.   . COLONOSCOPY  July 2011   Dr. Benson [place]: multiple polyps, internal and external hemorrhoids, diverticulosis. tubular adenoma  . ESOPHAGOGASTRODUODENOSCOPY (EGD) WITH PROPOFOL N/A 04/20/2013   Procedure: ESOPHAGOGASTRODUODENOSCOPY (EGD) WITH PROPOFOL;  Surgeon: Daneil Dolin, MD;  Location: [facility];  Service: Endoscopy;  Laterality: N/A;  . KNEE SURGERY     X2  Medications:  Outpatient Encounter Prescriptions as of 12/21/2016  Medication Sig  . acetaminophen (TYLENOL) 325 MG tablet Take 2 tablets (650 mg total) by mouth every 6 (six) hours as needed for mild pain or fever.  Marland Kitchen albuterol (PROVENTIL HFA;VENTOLIN HFA) 108 (90 BASE) MCG/ACT inhaler Inhale 2 puffs into the lungs every 6 (six) hours as needed for wheezing or shortness of breath.  Marland Kitchen albuterol (PROVENTIL) (2.5 MG/3ML) 0.083% nebulizer solution Take 3 mLs (2.5 mg  total) by nebulization every 6 (six) hours as needed for wheezing or shortness of breath.  Marland Kitchen apixaban (ELIQUIS) 5 MG TABS tablet Take 1 tablet (5 mg total) by mouth 2 (two) times daily.  . bisacodyl (DULCOLAX) 10 MG suppository Place 1 suppository (10 mg total) rectally daily.  . cholecalciferol (VITAMIN D) 1000 UNITS tablet Take 2,000 Units by mouth daily.  . Cinnamon 500 MG capsule Take 1,000 mg by mouth every morning.   . diltiazem (CARDIZEM CD) 120 MG 24 hr capsule Take 1 capsule (120 mg total) by mouth daily.  Marland Kitchen docusate sodium (COLACE) 100 MG capsule Take 100 mg by mouth 2 (two) times daily.  . furosemide (LASIX) 20 MG tablet Take 3 tablets (60 mg total) by mouth 2 (two) times daily.  Marland Kitchen gabapentin (NEURONTIN) 300 MG capsule Take 1 capsule (300 mg total) by mouth 2 (two) times daily.  . insulin aspart (NOVOLOG) 100 UNIT/ML injection Inject 8 Units into the skin 3 (three) times daily with meals.  . insulin aspart (NOVOLOG) 100 UNIT/ML injection Inject 0-9 Units into the skin 3 (three) times daily with meals. Sliding scale CBG 70 - 120: 0 units CBG 121 - 150: 1 unit,  CBG 151 - 200: 2 units,  CBG 201 - 250: 3 units,  CBG 251 - 300: 5 units,  CBG 301 - 350: 7 units,  CBG 351 - 400: 9 units   CBG > 400: 9 units and notify your MD  . insulin detemir (LEVEMIR) 100 UNIT/ML injection Inject 24 Units into the skin daily.  Marland Kitchen loperamide (IMODIUM) 2 MG capsule Take 1 capsule (2 mg total) by mouth 3 (three) times daily as needed for diarrhea or loose stools.  Marland Kitchen loratadine (CLARITIN) 10 MG tablet Take 10 mg by mouth daily.  . meclizine (ANTIVERT) 12.5 MG tablet Take 12.5 mg by mouth 3 (three) times daily as needed for dizziness.  . metoprolol tartrate (LOPRESSOR) 25 MG tablet Take 0.5 tablets (12.5 mg total) by mouth 2 (two) times daily.  . Naproxen Sodium (ALEVE PO) Take by mouth.  . pantoprazole (PROTONIX) 40 MG tablet Take 40 mg by mouth 2 (two) times daily.  . simvastatin (ZOCOR) 40 MG tablet Take 40 mg  by mouth every evening.  . terazosin (HYTRIN) 1 MG capsule Take 1 capsule (1 mg total) by mouth at bedtime.  . traZODone (DESYREL) 150 MG tablet Take by mouth.  . [DISCONTINUED] citalopram (CELEXA) 20 MG tablet Take 1 tablet (20 mg total) by mouth daily.  . [DISCONTINUED] ziprasidone (GEODON) 20 MG capsule Take 1 capsule (20 mg total) by mouth 2 (two) times daily with a meal.   No facility-administered encounter medications on file as of 12/21/2016.      Allergies:  Allergies  Allergen Reactions  . Phenergan [Promethazine Hcl] Other (See Comments)    Becomes very confused and aggressive  . Haldol [Haloperidol Lactate] Other (See Comments)    Shaking   . Metformin And Related Diarrhea    Family History: Family History  Problem  Relation Age of Onset  . Thyroid disease Mother   . Parkinson's disease Father   . Parkinson's disease    . Colon cancer Neg Hx     Social History: Social History  Substance Use Topics  . Smoking status: Former Smoker    Packs/day: 1.00    Years: 50.00    Types: Cigarettes    Quit date: 09/16/2013  . Smokeless tobacco: Never Used  . Alcohol use No     Comment: history of ETOH abuse in remote past, none in at least 10 years   Social History   Social History Narrative   Drinks about 4 cups of caffeine daily. 2 cups of coffee and 2 coke zeros.    Review of Systems:  CONSTITUTIONAL: No fevers, chills, night sweats, or weight loss.   EYES: No visual changes or eye pain ENT: No hearing changes.  No history of nose bleeds.   RESPIRATORY: No cough, wheezing +shortness of breath.   CARDIOVASCULAR: Negative for chest pain, and palpitations.   GI: Negative for abdominal discomfort, blood in stools or black stools.  No recent change in bowel habits.   GU:  No history of incontinence.   MUSCLOSKELETAL: +history of joint pain or swelling.  No myalgias.   SKIN: Negative for lesions, rash, and itching.   HEMATOLOGY/ONCOLOGY: Negative for prolonged  bleeding, bruising easily, and swollen nodes.   ENDOCRINE: Negative for cold or heat intolerance, polydipsia or goiter.   PSYCH:  +depression or anxiety symptoms.   NEURO: As Above.   Vital Signs:  BP 120/80   Pulse 78   Ht 6' (1.829 m)   Wt (!) 302 lb 6 oz (137.2 kg)   SpO2 92%   BMI 41.01 kg/m   Orthostatic VS for the past 24 hrs (Last 3 readings):  BP- Lying Pulse- Lying BP- Sitting Pulse- Sitting BP- Standing at 0 minutes Pulse- Standing at 0 minutes  12/21/16 0939 130/80 74 120/80 78 118/78 78    General Medical Exam:   General:  Poorly groomed, sitting with 3L nasal canula, comfortable.   Eyes/ENT: see cranial nerve examination.  Extremities:  1+ pitting edema of the legs. Skin:  Very dry and flaky skin.  Neurological Exam: MENTAL STATUS including orientation to time, place, person, recent and remote memory, attention span and concentration, language, and fund of knowledge is fairly intact.  Speech is not dysarthric.  CRANIAL NERVES: II:  No visual field defects.  Unremarkable fundi.   III-IV-VI: Pupils equal round and reactive to light.  Normal conjugate, extra-ocular eye movements in all directions of gaze.  No nystagmus.  Right ptosis (old).   V:  Normal facial sensation.     VII:  Normal facial symmetry and movements.    VIII:  Normal hearing and vestibular function.   IX-X:  Normal palatal movement.   XI:  Normal shoulder shrug and head rotation.   XII:  Normal tongue strength and range of motion, no deviation or fasciculation.  MOTOR: Motor strength is 5/5 throughout. No atrophy, fasciculations or abnormal movements.  No pronator drift.  Tone is normal.    MSRs: Reflexes are 1+/4 in the upper extremities and absent in the lower extremities.  SENSORY: Vibration and temperature is markedly reduced distal to ankles bilaterally.  COORDINATION/GAIT: Normal finger-to- nose-finger.  Intact rapid alternating movements bilaterally.  Gait is wide-based, slow and assisted  with walker  IMPRESSION: Mr. Gaughan is a 71 year-old man with multiple medical comorbidities referred for evaluation of lightheadedness.  He denies symptoms suggestive of vertigo and exam does not support this, so I will discontinue his meclizine.  Although his orthostatic vital signs did not drop significantly, there was about 12 point drop in SBP from supine to standing and he was symptomatic.  His history also suggests symptoms related to orthostasis, moreso than inner ear pathology.  His lightheadedness is multifactorial, contributed by diabetic neuropathy and dysautonomia, polypharmacy, and underlying medical conditions.  Because he no longer has painful paresthesias, I recommend to taper his gabapentin by 300mg /week in hopes of discontinuing the medication and eliminating this as a potential contributor to his symptoms.  I emphasized the importance of maintaining control of his blood sugars and discussed strategies such as wearing compression stockings, but I think this would be very challenging for patient and his wife to place on him given his body habitus.  Therefore, staying active even with chair exercises was stressed.  Apparently, he was doing much better when he was getting regular physical therapy at home. Regarding his headaches, these are most likely due to nocturnal hypoxia and I encouraged him to be complaint with his non-invasive ventilatory support.  I do not see any worrisome findings on exam to warrant intracranial imaging.  The duration of this appointment visit was 60 minutes of face-to-face time with the patient.  Greater than 50% of this time was spent in counseling, explanation of diagnosis, planning of further management, and coordination of care.   Thank you for allowing me to participate in patient's care.  If I can answer any additional questions, I would be pleased to do so.    Sincerely,    Katanya Schlie K. Posey Pronto, DO

## 2016-12-25 ENCOUNTER — Telehealth: Payer: Self-pay | Admitting: *Deleted

## 2016-12-25 ENCOUNTER — Telehealth: Payer: Self-pay | Admitting: Neurology

## 2016-12-25 NOTE — Telephone Encounter (Signed)
Patient wants to talk to someone about him still being dizzy after cutting back his medication that she wanted him to

## 2016-12-26 ENCOUNTER — Other Ambulatory Visit: Payer: Self-pay | Admitting: *Deleted

## 2016-12-26 DIAGNOSIS — J449 Chronic obstructive pulmonary disease, unspecified: Secondary | ICD-10-CM | POA: Diagnosis not present

## 2016-12-26 DIAGNOSIS — G4733 Obstructive sleep apnea (adult) (pediatric): Secondary | ICD-10-CM | POA: Diagnosis not present

## 2016-12-26 DIAGNOSIS — S82843A Displaced bimalleolar fracture of unspecified lower leg, initial encounter for closed fracture: Secondary | ICD-10-CM | POA: Diagnosis not present

## 2016-12-26 NOTE — Patient Outreach (Signed)
[facility]) Care Management   12/26/2016  Rick Cruz 09/12/1946 [id number]  Rick Cruz is an 71 y.o. 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  Rick Cruz was recently seen by Rick Cruz, neurologist for dizziness.  Dr Rick Cruz recommended tapering off Neurontin, stopping Antivert, staying active, chair exercises and review of medications with pcp possibly contributing to lightheadedness. During the neurology visit Rick Cruz was noted with hypotension and decrease in oxygen. Rick Cruz was referred to [facility] for related oxygen  DME needs and a [facility] CM home outreach visit need prompted from concerns noted during neurological visit and request to [facility] for continuous 3 L of oxygen recommended per Rick and Rick Cruz by the neurologist, Rick Cruz.  During the neurological visit Rick Cruz gave out of oxygen and Rick [facility] "four tanks" before Rick Cruz could leave the [place] pulmonologist office to return home to [place].  Rick Cruz reports the oxygen tanks were large and cumbersome to carry using his rollator and Rick Cruz has past back concerns that causes difficulty with transporting large oxygen tanks.    Subjective:  "I want continuous oxygen at three liters" "I like the inogen G four"   Objective:   BP 102/62-109/67   Pulse 68   Resp (!) 22   Ht 1.829 m (6')   Wt (!) 302 lb (137 kg)   BMI 40.96 kg/m  O2 sat 71-90% on 3 L O2 [place]   Review of Systems  Constitutional: Negative for diaphoresis, fever, malaise/fatigue and weight loss.  HENT: Negative for congestion, ear discharge, ear pain, hearing loss, nosebleeds, sinus pain, sore throat and tinnitus.    Eyes: Negative.  Negative for blurred vision, double vision, photophobia, pain, discharge and redness.  Respiratory: Positive for shortness of breath. Negative for cough, hemoptysis, sputum production, wheezing and stridor.        SOB on oxygen continuous 3 L [place]   Cardiovascular: Positive for leg swelling. Negative for chest pain, palpitations, orthopnea and claudication.       Lower extremity swelling Legs elevated in recliner  Gastrointestinal: Negative.  Negative for abdominal pain, blood in stool, constipation, diarrhea, heartburn, melena, nausea and vomiting.  Genitourinary: Negative.  Negative for dysuria, flank pain, frequency, hematuria and urgency.  Musculoskeletal: Positive for joint pain. Negative for back pain, falls, myalgias and neck pain.  Skin: Negative.  Negative for itching and rash.  Neurological: Positive for dizziness and sensory change. Negative for tingling, tremors, speech change, focal weakness, seizures, loss of consciousness, weakness and headaches.       Neuropathy lower extremities Dizziness  Endo/Heme/Allergies: Negative for environmental allergies and polydipsia. Does not bruise/bleed easily.  Psychiatric/Behavioral: Negative for depression, hallucinations, memory loss, substance abuse and suicidal ideas. The patient is nervous/anxious. The patient does not have insomnia.     Physical Exam  Constitutional: He is oriented to person, place, and time. He appears well-developed and well-nourished.  HENT:  Head: Normocephalic and atraumatic.  Right Ear: External ear normal.  Left Ear: External ear normal.  Eyes: Pupils are equal, round, and reactive to light.  Neck: Normal range of motion. Neck supple.  Cardiovascular: Normal rate, regular rhythm, normal heart sounds and intact distal pulses.   Respiratory:  Breath sounds normal.  GI: Soft. Bowel sounds are normal.  Musculoskeletal: He exhibits edema.  Lower extremities  Neurological: He is alert and oriented  to person, place, and time.  Skin: Skin is warm and dry.  Psychiatric: He has a normal mood and affect. His behavior is normal. Judgment and thought content normal.    Encounter Medications:   Outpatient Encounter Prescriptions as of 12/26/2016  Medication Sig  . acetaminophen (TYLENOL) 325 MG tablet Take 2 tablets (650 mg total) by mouth every 6 (six) hours as needed for mild pain or fever.  Marland Kitchen albuterol (PROVENTIL HFA;VENTOLIN HFA) 108 (90 BASE) MCG/ACT inhaler Inhale 2 puffs into the lungs every 6 (six) hours as needed for wheezing or shortness of breath.  Marland Kitchen albuterol (PROVENTIL) (2.5 MG/3ML) 0.083% nebulizer solution Take 3 mLs (2.5 mg total) by nebulization every 6 (six) hours as needed for wheezing or shortness of breath.  Marland Kitchen apixaban (ELIQUIS) 5 MG TABS tablet Take 1 tablet (5 mg total) by mouth 2 (two) times daily.  . bisacodyl (DULCOLAX) 10 MG suppository Place 1 suppository (10 mg total) rectally daily.  . cholecalciferol (VITAMIN D) 1000 UNITS tablet Take 2,000 Units by mouth daily.  . Cinnamon 500 MG capsule Take 1,000 mg by mouth every morning.   . citalopram (CELEXA) 40 MG tablet Take 40 mg by mouth daily.  Marland Kitchen diltiazem (CARDIZEM CD) 120 MG 24 hr capsule Take 1 capsule (120 mg total) by mouth daily.  Marland Kitchen docusate sodium (COLACE) 100 MG capsule Take 100 mg by mouth 2 (two) times daily.  . furosemide (LASIX) 20 MG tablet Take 3 tablets (60 mg total) by mouth 2 (two) times daily.  Marland Kitchen gabapentin (NEURONTIN) 300 MG capsule Take 1 capsule (300 mg total) by mouth 2 (two) times daily.  . insulin aspart (NOVOLOG) 100 UNIT/ML injection Inject 8 Units into the skin 3 (three) times daily with meals.  . insulin aspart (NOVOLOG) 100 UNIT/ML injection Inject 0-9 Units into the skin 3 (three) times daily with meals. Sliding scale CBG 70 - 120: 0 units CBG 121 - 150: 1 unit,  CBG 151 - 200: 2 units,  CBG 201 - 250: 3 units,  CBG 251 - 300: 5 units,  CBG 301 - 350: 7 units,  CBG 351 - 400: 9 units   CBG >  400: 9 units and notify your MD  . insulin detemir (LEVEMIR) 100 UNIT/ML injection Inject 24 Units into the skin daily.  Marland Kitchen loperamide (IMODIUM) 2 MG capsule Take 1 capsule (2 mg total) by mouth 3 (three) times daily as needed for diarrhea or loose stools.  Marland Kitchen loratadine (CLARITIN) 10 MG tablet Take 10 mg by mouth daily.  . metoprolol tartrate (LOPRESSOR) 25 MG tablet Take 0.5 tablets (12.5 mg total) by mouth 2 (two) times daily.  . Naproxen Sodium (ALEVE PO) Take by mouth.  . pantoprazole (PROTONIX) 40 MG tablet Take 40 mg by mouth 2 (two) times daily.  . simvastatin (ZOCOR) 40 MG tablet Take 40 mg by mouth every evening.  . terazosin (HYTRIN) 1 MG capsule Take 1 capsule (1 mg total) by mouth at bedtime.  . traZODone (DESYREL) 150 MG tablet Take by mouth.   No facility-administered encounter medications on file as of 12/26/2016.      Assessment:   Rick Cruz is a pleasant gentleman noted to be sitting in his recliner elevating his legs with various tables for food, drink, remotes and electronics.  He was initially speaking with someone via phone  when [facility] CM arrived.  He agreed to [facility] services and also provided permission for this [facility] CM to speak with his wife until he completed his call.   Rick Cruz was noted to have difficulty with speech related to removed teeth but is alert and oriented x 3.  He reports an online search was completed by him for a portable 3 L+ oxygen tank and he reports speaking with [employer] staff about a G4 model with a $3000 cost for DME. Rick Cruz also states he has consulted "[name] from [place]" Rick Cruz was made aware of [facility] CM consult to [facility] [place]ta [place]) and Garth Schlatter for Healthte[employer] advantage coverage of an [employer] (it is not covered, and would be an out of pocket cost). He and Rick Dorwart voiced understanding.   Rick and Rick Figg agreed to continue to use the DME and services provided by [facility] at this time but continues to express interest in searching for  another company that may be able to provide them with a portable oxygen tank that supplies 3+ Liters of oxygen.  They are interested in inquiring if HTA covers "smaller tanks and home concentrator" They want "to return the pulser" and are interested in a possible way of "filling tanks at home" Rick Stamour' mother visited at the end of the home visit.   Plan:  [facility] CM will continue to assess and assist Rick Frisina with coordination of DME, specialist and symptom needs.  THN will return telephone calls to Rick Cruz for follow up/update and plan another visit as needed  [facility] CM assessed Rick Kneisel.  [facility] CM contacted [facility] to discuss present oxygen DME and Rick Boudoin interest in a portable DME called [employer] which is not supplied by [facility] and the ability to "fill up tanks here at home"  [facility] provides Rick Din presently with a mini go simply, 6 Liter pulser, and a home concentrator.  [employer] states an Inogen would be an out of pocket expense. THN CM consulted with [employer] Advantage (HTA) UR staff, [place] related to HTA plan coverage for oxygen Presently covering all items Rick Kant is receiving from [facility] ( home concentrator and portable tanks)  [facility] CM left messages at Dr Luan Pulling, pulmonology office  [facility] CM called and left a message while at home visit and received a return call from Main supply clinic in [place] ([phone]) so that CM, Rick& Rick Cruz could discuss Rick Cruz' needs and ask questions of the [employer] provider.  [employer] staff member, "Merrilee Seashore" confirms the company is 70 years old and [employer] DMEs are not covered by [employer], there is not an office or representative in [place] to provided service on DME if repairs needed (only [place] and [place]) and he has only one DME that provides 3 L oxygen continuously called ox-lise ( but Merrilee Seashore is unable to provide the manufacturer's name of the $2389 Ox-lise to Rick Bertino). Merrilee Seashore also discussed that the cost of the DME does  not include the cost of a service plan + warranty.  If DME is purchased the recommendation is for Rick Guardia to have a secondary Oxygen source and to send DME back to the company for service via ground delivery or Fedex overnight.    [facility] CM spoke with Caryl Pina, Neurological RN when she returned a call to Rick Cruz during the visit.  Caryl Pina reports pt also noted with low BP  and oxygen saturation at Dr Serita Grit office.  Her recommendation also is for Rick Fyock to have a pulmonology consult.    THN CM spoke with Kieth Brightly at Dr Julianne Rice office to updated on abnormal symptoms during home visit (low BP, low oxygen sat) and requested pulmonology consult (interested in [place]). Pending return call from pcp.  THN CM discussed Rick Sabic concerns with Jacqlyn Larsen at Dr Luan Pulling office.  Dr Luan Pulling not accepting new patients, may consider a referral from pcp.  Becky discussed two oxygen providers that may be able to assist with 3 L continuous portable mini tanks ( [facility] and [facility])  [facility] CM Care Plan Problem One     Most Recent Value  Care Plan Problem One  Knowledge of chronic illness (Chronic respiratory failure, DM2, CAD, CHF, PAF) and oxygen/DME management needs/availabilty   Role Documenting the Problem One  Care Management Coordinator  Care Plan for Problem One  Active  THN Long Term Goal (31-90 days)  Over the next 31 days the patient and family will verbalize knowledge of home care maintanence of chronic medical illnesses and DME needed for home care   [facility] Long Term Goal Start Date  12/26/16  Interventions for Problem One Long Term Goal  review of zone symptoms to report to providers, discussed standard values for bp, heart rate, weight gain, etc  THN CM Short Term Goal #1 (0-30 days)  Over the next 30 days the patient and family will verbalize understanding of zone symptoms to report to providers for chronic illnesses (respiratory failure, CAD, DM, PAF, CHF)  THN CM Short Term Goal #1 Start Date  12/26/16   Interventions for Short Term Goal #1  Review zone symptoms, and normal values for vital signs, weight, provided educational packages,  THN CM Short Term Goal #2 (0-30 days)  over the next 14-21 days the patient will have DME and possible medical providers needed for management of chronic respiratory illness  [facility]N CM Short Term Goal #2 Start Date  12/27/16  Interventions for Short Term Goal #2  contact DME agencies ([facility], [facility] medical supply clinic and other recommended Oxygen DME companies recommended by pulmonologist, contact pcp, request pulmonology consult, assist with coordination of DME        Raeana Blinn L. Lavina Hamman, RN, BSN, [place] Care Management [phone]

## 2016-12-26 NOTE — Telephone Encounter (Signed)
I spoke with patient's wife and Joelene Millin from [facility] (she was at patient's house) and they said that patient is still having dizziness.  Joelene Millin said that his BP was a little low and his O2 sats are low.  I suggested having him evaluated by PCP and possible referral to pulmonologist.

## 2016-12-27 ENCOUNTER — Encounter: Payer: Self-pay | Admitting: *Deleted

## 2016-12-27 NOTE — Telephone Encounter (Signed)
Agree - he will need to discuss with his cardiologist/PCP about any medication changes.

## 2016-12-31 ENCOUNTER — Other Ambulatory Visit: Payer: Self-pay | Admitting: *Deleted

## 2016-12-31 NOTE — Patient Outreach (Addendum)
[facility]) Care Management  12/31/2016  Rick Cruz Sep 04, 1946 [id number]   Care Coordination of care  [facility] CM called [facility] and [facility] to see if they were in network with Rick Cruz insurance and could provide him the 3 L [place] oxygen continuous recommended by by his neurologist.   Rick Cruz from [place] is out of network for his insurance Rick Cruz from Adult &pediatric specialist confirms they are also out of network for his insurance but he may be able to use his out of network coverage at 70/30 if he chooses.  Rick Cruz confirms there is not a small portable canister that will last him more than "fifty four minutes at a continuous three liters"  Reports she had a M6 tank, that is small enough to take on his shoulder, a C tank will last for 1 hour and 20 minutes at 3 L) and a D2 tank (larger- will last for 2 hours and 18 minutes at 3 L).  Another consideration per Rick Cruz is the amount of oxygen that can be carried with him on trips.  Rick Cruz mentioned he is wanting to go on a cruise in summer and will need oxygen. Rick Cruz recommends he call the cruise ship for recommendation if and how much oxygen can be taken on board. THN called  Rick Cruz and spoke with Rick Cruz about responses from [place].and adult & pediatric specialis  Rick Cruz did not sleep well and is hard to arouse today per Rick Cruz, wife.  They are okay with staying with  [facility] for oxygen at this time. Rick Cruz voiced noncompliance with use of oxygen and reports no follow up call from Rick Cruz, pcp office nor Rick Cruz (pulmonologist) office after [facility] CM spoke with staff to request a referral to Rick Cruz or a recommended pulmonologist of Rick Cruz choice on 12/27/16 0832. [facility]N CM had spoke with Rick Cruz on 12/27/16 at to 1650 to update her on call from Rick Cruz office staff and [facility] CM receiving two recommendations from [facility] at Rick Cruz office for possible assistance with oxygen recommendations.    They confirm that they are scheduled to be seen by Rick Julianne Rice PA, Rick Cruz today at 1415 (Mrs Rick Cruz saw PA on 12/28/16 for elevated potassium and was sent to ER but discharged- she confirms she is still grieving lost of "Rick Cruz", is fatigue, MH recommended, hospice, support group, individual counseling- Dealing with Rick Bonsignore noncompliance with oxygen and insulin pump use is becoming stressful) A [facility] SW referral was agreed upon for possible grief management and community resources for personal care services in the home.     plans [facility] CM will call pcp, [place] and complete [facility] SW referral  Rick Senn will be called as needed for follow this week and will be seen in 1-2 weeks for [facility] CM home visit    Assad Harbeson L. Lavina Hamman, RN, BSN, [place] Care Management [phone]

## 2016-12-31 NOTE — Patient Outreach (Addendum)
[facility]) Care Management  12/31/2016  LOUAY MYRIE 1946-01-27 [id number]  Care coordination [facility] CM spoke with Janett Billow at Dr Kris Mouton office to check on status of requested referral for pulmonologist and order for 3 L oxygen [place] to be faxed to [facility].  Janett Billow confirms an appointment noted to see Dr Luan Pulling on Jan 17, 2017 at 0915. Reports this appointment was confirmed to be the earliest appointment with Dr Luan Pulling by Denman George in his office. Jessica sent a message to Dr Julianne Rice nurse for today to fax orders to for 3 L Oxygen  to [facility] to fax # [phone]  Plans [facility] CM will follow up with pt after his appointment scheduled for today, to review appointment with Dr Luan Pulling and to check on pt status THN to complete [facility] SW referral   [place]. Lavina Hamman, RN, BSN, [place] Care Management [phone]

## 2017-01-02 DIAGNOSIS — N401 Enlarged prostate with lower urinary tract symptoms: Secondary | ICD-10-CM | POA: Diagnosis not present

## 2017-01-02 DIAGNOSIS — R42 Dizziness and giddiness: Secondary | ICD-10-CM | POA: Diagnosis not present

## 2017-01-02 DIAGNOSIS — Z79899 Other long term (current) drug therapy: Secondary | ICD-10-CM | POA: Diagnosis not present

## 2017-01-03 DIAGNOSIS — J449 Chronic obstructive pulmonary disease, unspecified: Secondary | ICD-10-CM | POA: Diagnosis not present

## 2017-01-03 DIAGNOSIS — E1165 Type 2 diabetes mellitus with hyperglycemia: Secondary | ICD-10-CM | POA: Diagnosis not present

## 2017-01-03 DIAGNOSIS — J9620 Acute and chronic respiratory failure, unspecified whether with hypoxia or hypercapnia: Secondary | ICD-10-CM | POA: Diagnosis not present

## 2017-01-03 DIAGNOSIS — L219 Seborrheic dermatitis, unspecified: Secondary | ICD-10-CM | POA: Diagnosis not present

## 2017-01-03 DIAGNOSIS — I4891 Unspecified atrial fibrillation: Secondary | ICD-10-CM | POA: Diagnosis not present

## 2017-01-04 ENCOUNTER — Encounter: Payer: Self-pay | Admitting: *Deleted

## 2017-01-04 ENCOUNTER — Other Ambulatory Visit: Payer: Self-pay | Admitting: *Deleted

## 2017-01-04 NOTE — Patient Outreach (Signed)
[facility] [facility]) Care Management  01/04/2017  Rick Cruz [birth year] [id number]   CSW received referral from [name], [name] for community resources, caregiver issues, mental health issues (dx: schizophrenia). CSW called & spoke with patient's wife, Rick Cruz [phone]) to schedule initial home visit with [facility], [name] next Wednesday - 01/09/17. CSW will follow-up with assessments & intake at that time.    [name], LCSW [facility]  Clinical Social Worker cell #: [phone]

## 2017-01-08 ENCOUNTER — Other Ambulatory Visit: Payer: Self-pay | Admitting: Pharmacist

## 2017-01-08 NOTE — Patient Outreach (Signed)
[facility]) Care Management  01/08/2017  Rick Cruz [birth year] [id number]  [place] received referral from [place] to review patient's medications.  Noted patient's PCP is Dr [name], and in order to ensure [facility] Pharmacist reviews accurate medication list, would like to speak with patient regarding which medications he is presently taking at home.    Called cell phone number first, spouse answered and advised [facility] Pharmacist call home phone number to speak with patient.   Call to home phone number listed in chart, no answer, HIPAA compliant message left requesting return call.   Plan:  If no return call from patient, will make another outreach to patient in the next week.   Karrie Meres, PharmD, [place] [phone]

## 2017-01-09 ENCOUNTER — Other Ambulatory Visit: Payer: Self-pay | Admitting: *Deleted

## 2017-01-09 ENCOUNTER — Encounter: Payer: Self-pay | Admitting: *Deleted

## 2017-01-09 NOTE — Patient Outreach (Signed)
[place] [facility]) Care Management  01/09/2017  ALVEN ALVERIO Jun 02, 1946 [id number]   CSW received referral from [place], [name] for community resources, caregiver issues, mental health issues (dx: schizophrenia). CSW & RNCM, [name] met with patient & his wife, [name]. Wife informed CSW that their son that lived with them, Rachel Bo had passed away unexpectedly at their home August 31st, 2017 at the age of 64 from a pulmonary embolism. Patient's wife has been attending support groups through [facility] in [place] and also through [place] and feels that patient has not "properly grieved" their son. CSW spoke with patient about options for support, but patient declined. CSW explained to patient's wife that people process grief in different ways and though going to support groups and "talking it out" works for her, it may not work for him. Patient states that if he doesn't think about it, he's ok but once he gets to thinking about it is when he gets depressed. CSW provided resources to patient's wife for support and encouraged her to get in contact with psychiatrist/counselor for additional help. Patient's wife states that she feels "guilty" for leaving him at home while she goes to get support, but CSW explored options for assistance - patient's 47 year old mother is still very active and brings them food from time to time. CSW encouraged patient's wife to reach out to family and friends to sit with him while she is gone to appointments. CSW also provided patient's wife with community resources for companion care services through [facility] [place] in [place].   CSW will reach out to patient & wife in the next 2 weeks to follow-up on grief counseling/support services.    [name], LCSW [facility]  Clinical Social Worker cell #: [phone]

## 2017-01-10 ENCOUNTER — Ambulatory Visit: Payer: Self-pay | Admitting: Pharmacist

## 2017-01-11 ENCOUNTER — Encounter: Payer: Self-pay | Admitting: *Deleted

## 2017-01-14 ENCOUNTER — Other Ambulatory Visit: Payer: Self-pay | Admitting: Pharmacist

## 2017-01-14 DIAGNOSIS — I10 Essential (primary) hypertension: Secondary | ICD-10-CM | POA: Diagnosis not present

## 2017-01-14 DIAGNOSIS — I951 Orthostatic hypotension: Secondary | ICD-10-CM | POA: Diagnosis not present

## 2017-01-14 DIAGNOSIS — Z0189 Encounter for other specified special examinations: Secondary | ICD-10-CM | POA: Diagnosis not present

## 2017-01-14 DIAGNOSIS — R0602 Shortness of breath: Secondary | ICD-10-CM | POA: Diagnosis not present

## 2017-01-14 NOTE — Patient Outreach (Signed)
[place] [name]) Care Management  01/14/2017  Rick Cruz [birth year] [id number]  Second unsuccessful phone outreach to patient.  HIPAA compliant message left requesting return call.   Plan:  If no return call, will make third outreach attempt in the next week.   Karrie Meres, PharmD, [place] [phone]

## 2017-01-15 NOTE — Patient Outreach (Signed)
[facility]) Care Management   01/09/2017  [name] 05-02-1946 [id number]  Rick Cruz is an 71 y.o. 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  Rick Cruz was recently seen by Rick Cruz, neurologist for dizziness.  Rick Cruz recommended tapering off Neurontin, stopping Antivert, staying active, chair exercises and review of medications with pcp possibly contributing to lightheadedness. During the neurology visit Rick Cruz was noted with hypotension and decrease in oxygen. Rick Cruz was referred to [facility] for related oxygen DME needs and a [facility] home outreach visit need prompted from concerns noted during neurological visit and request to [facility] for continuous 3 Cruz of oxygen recommended per Rick Cruz by the neurologist, Rick Cruz.  During the neurological visit Rick Cruz gave out of oxygen and Rick Cruz had to [facility] "four tanks" before Rick Cruz could leave the [place] pulmonologist office to return home to [place] .  Rick Cruz reports the oxygen tanks were large and cumbersome to carry using his rollator and Rick Cruz has past back concerns that causes difficulty with transporting large oxygen tanks These issues were resolved after there first home visit with various calls to Rick Cruz home health DME provider and his providers  Subjective: "She needs more help than me" referring to his spouse related to grief of death of son  Objective:   BP 104/66 (BP Location: Left Arm, Patient Position: Sitting, Cuff Size: Large)   Pulse 86   SpO2 95%   Review of Systems  Constitutional: Negative.  Negative for chills, diaphoresis, fever, malaise/fatigue and weight  loss.  HENT: Negative.  Negative for congestion, ear discharge, ear pain, hearing loss, nosebleeds, sinus pain, sore throat and tinnitus.   Eyes: Negative for blurred vision, double vision, photophobia, pain, discharge and redness.  Respiratory: Negative for cough, hemoptysis, sputum production, shortness of breath, wheezing and stridor.   Cardiovascular: Negative for chest pain, palpitations, orthopnea, claudication, leg swelling and PND.  Gastrointestinal: Negative.  Negative for abdominal pain, blood in stool, constipation, diarrhea, heartburn, melena, nausea and vomiting.  Genitourinary: Negative.  Negative for dysuria, flank pain, frequency, hematuria and urgency.  Musculoskeletal: Positive for myalgias. Negative for back pain, falls, joint pain and neck pain.  Skin: Negative.  Negative for itching and rash.  Neurological: Negative.  Negative for dizziness, tingling, tremors, sensory change, speech change, focal weakness, seizures, loss of consciousness, weakness and headaches.  Endo/Heme/Allergies: Negative for environmental allergies and polydipsia. Bruises/bleeds easily.  Psychiatric/Behavioral: Negative for depression, hallucinations, memory loss, substance abuse and suicidal ideas. The patient has insomnia. The patient is not nervous/anxious.     Physical Exam  Constitutional: He is oriented to person, place, and time. He appears well-developed and well-nourished.  HENT:  Head: Normocephalic and atraumatic.  Eyes: Conjunctivae and EOM are normal. Pupils are equal, round, and reactive to light.  Neck: Neck supple.  Cardiovascular: Normal rate, regular rhythm, normal heart sounds and intact distal pulses.   Respiratory: Effort normal and breath sounds normal.  GI: Soft. Bowel sounds are normal.  Musculoskeletal: Normal range of motion.  Neurological: He is alert and oriented to person, place, and time.  Skin: Skin is warm and dry.  Psychiatric: He has a normal mood and affect. His  behavior  is normal. Judgment and thought content normal.    Encounter Medications:   Outpatient Encounter Prescriptions as of 01/09/2017  Medication Sig  . acetaminophen (TYLENOL) 325 MG tablet Take 2 tablets (650 mg total) by mouth every 6 (six) hours as needed for mild pain or fever.  Marland Kitchen albuterol (PROVENTIL HFA;VENTOLIN HFA) 108 (90 BASE) MCG/ACT inhaler Inhale 2 puffs into the lungs every 6 (six) hours as needed for wheezing or shortness of breath.  Marland Kitchen albuterol (PROVENTIL) (2.5 MG/3ML) 0.083% nebulizer solution Take 3 mLs (2.5 mg total) by nebulization every 6 (six) hours as needed for wheezing or shortness of breath.  Marland Kitchen apixaban (ELIQUIS) 5 MG TABS tablet Take 1 tablet (5 mg total) by mouth 2 (two) times daily.  . bisacodyl (DULCOLAX) 10 MG suppository Place 1 suppository (10 mg total) rectally daily.  . cholecalciferol (VITAMIN D) 1000 UNITS tablet Take 2,000 Units by mouth daily.  . Cinnamon 500 MG capsule Take 1,000 mg by mouth every morning.   . citalopram (CELEXA) 40 MG tablet Take 40 mg by mouth daily.  Marland Kitchen diltiazem (CARDIZEM CD) 120 MG 24 hr capsule Take 1 capsule (120 mg total) by mouth daily.  Marland Kitchen docusate sodium (COLACE) 100 MG capsule Take 100 mg by mouth 2 (two) times daily.  . furosemide (LASIX) 20 MG tablet Take 3 tablets (60 mg total) by mouth 2 (two) times daily.  Marland Kitchen gabapentin (NEURONTIN) 300 MG capsule Take 1 capsule (300 mg total) by mouth 2 (two) times daily.  . insulin aspart (NOVOLOG) 100 UNIT/ML injection Inject 8 Units into the skin 3 (three) times daily with meals.  . insulin aspart (NOVOLOG) 100 UNIT/ML injection Inject 0-9 Units into the skin 3 (three) times daily with meals. Sliding scale CBG 70 - 120: 0 units CBG 121 - 150: 1 unit,  CBG 151 - 200: 2 units,  CBG 201 - 250: 3 units,  CBG 251 - 300: 5 units,  CBG 301 - 350: 7 units,  CBG 351 - 400: 9 units   CBG > 400: 9 units and notify your MD  . insulin detemir (LEVEMIR) 100 UNIT/ML injection Inject 24 Units into the  skin daily.  Marland Kitchen loperamide (IMODIUM) 2 MG capsule Take 1 capsule (2 mg total) by mouth 3 (three) times daily as needed for diarrhea or loose stools.  Marland Kitchen loratadine (CLARITIN) 10 MG tablet Take 10 mg by mouth daily.  . metoprolol tartrate (LOPRESSOR) 25 MG tablet Take 0.5 tablets (12.5 mg total) by mouth 2 (two) times daily.  . Naproxen Sodium (ALEVE PO) Take by mouth.  . pantoprazole (PROTONIX) 40 MG tablet Take 40 mg by mouth 2 (two) times daily.  . simvastatin (ZOCOR) 40 MG tablet Take 40 mg by mouth every evening.  . terazosin (HYTRIN) 1 MG capsule Take 1 capsule (1 mg total) by mouth at bedtime.  . traZODone (DESYREL) 150 MG tablet Take by mouth.   No facility-administered encounter medications on file as of 01/09/2017.     Functional Status:   In your present state of health, do you have any difficulty performing the following activities: 01/09/2017 12/27/2016  Hearing? N -  Vision? - Y  Difficulty concentrating or making decisions? N -  Walking or climbing stairs? Y Y  Dressing or bathing? Y -  Doing errands, shopping? Y Y  Some recent data might be hidden    Fall/Depression Screening:    Fall Risk  12/21/2016  Falls in the past year? Yes  Number falls in past  yr: 2 or more  Injury with Fall? No  Risk Factor Category  High Fall Risk  Risk for fall due to : Impaired balance/gait;Impaired mobility  Follow up Falls evaluation completed;Education provided;Falls prevention discussed   PHQ 2/9 Scores 01/09/2017  PHQ - 2 Score 4  PHQ- 9 Score 9    Assessment:   Rick Duve ambulated from his room to sit in his recliner in the living room near various tables for food, drink, remotes and electronics.  Rick Bollman continues to have difficulty with speech related to removed teeth but is alert and oriented x 3. During this home visit Rick Cruz met with [facility] CM and [facility] SW, Rick Cruz to offer Rick Russom resources to deal with his grief that Rick Cruz has stated she did not believe he had processed  through.  Rick Cruz reports no grieving concerns and his preference to stay busy not to stay focus on his son's passing.  [facility] CM was made aware by Rick Cruz that Rick Cruz was present at home in his recliner sitting beside his son when the son collapsed in the floor with initially issues breathing and later passed away.  Rick Cruz and Rick Cruz confirmed Rick Cruz was "confused" during the entire episode.  Rick Vanvranken states he manages his grief by watching "inspirational programs on tv and prayer" Rick Stickels was referred to [facility] SW during the visit to review the available resources for her. With Rick Rafalski having to need outside support for her depression the patient no longer has an active caregiver since their son pasted a way.  [place] information on personal care services availability with a charge of $27/hr with  a minimal of 2 hours required. Also discussed marriage and family counseling     Rick and Rick Franchi agreed to continue to use the DME and services provided by [facility] at this time but continues to express interest in searching for another company that may be able to provide them with a portable oxygen tank that supplies 3+ Liters of oxygen.  They have obtained extra tanks that can be filled for long trips out.  During this visit Rick Enderle is noted to be more active in his care by taking his medications and vital signs (checked his bp and pulse using his own DME) while [facility] CM present.  He did where his ventilator for a brief interval after Rick Cruz complained he was non compliant with wearing it Prior to [facility] CM departure he did remove the ventilator and returned to oxygen via [place] (3 Cruz continuously) He informed CM that he felt he was compliant with Vent use "seventy five percent of the time", "five out of seven days I wear it" He reports not being a morning person and staying up at night with insomnia, therefore using vent when spouse is sleeping.   He has an insulin pump but spouse reports  noncompliance with use,    Plan:  [facility] CM will continue to work with Rick Cruz on the management of his chronic medical needs and will see him in 1-2 weeks with follow up calls as needed  [facility] CM Care Plan Problem One     Most Recent Value  Care Plan Problem One   Knowledge of chronic illness (Chronic respiratory failure, DM2, CAD, CHF, PAF) and oxygen/DME management needs/availabilty   Role Documenting the Problem One  Care Management Coordinator  Care Plan for Problem One  Active  [facility] Long Term Goal (  31-90 days)  Over the next 31 days the patient and family will verbalize knowledge of home care maintanence of chronic medical illnesses and DME needed for home care  [facility] Long Term Goal Start Date  12/26/16  Interventions for Problem One Long Term Goal  review of zone symptoms to report to providers, discussed standard values for bp, heart rate, weight gain, etc  [facility] Short Term Goal #1 (0-30 days)  Over the next 30 days the patient and family will verbalize understanding of zone symptoms to report to providers for chronic illnesses (respiratory failure, CAD, DM, PAF, CHF  [facility] Short Term Goal #1 Start Date  12/26/16  Interventions for Short Term Goal #1  Review zone symptoms, and normal values for vital signs, weight, provided educational packages,  [facility] Short Term Goal #2 (0-30 days)  over the next 14-21 days the patient will have DME and possible medical providers needed for management of chronic respiratory illness  [facility] Short Term Goal #2 Start Date  12/27/16  [facility] CM Short Term Goal #2 Met Date  01/09/17  Interventions for Short Term Goal #2  contact DME agencies ([facility], [facility] and other recommended Oxygen DME companies recommended by pulmonologist, contact pcp, request pulmonology consult, assist with coordination of DME       Kimberly Cruz. Lavina Hamman, RN, BSN, [place] Care Management [phone]

## 2017-01-17 ENCOUNTER — Other Ambulatory Visit: Payer: Self-pay | Admitting: Pharmacist

## 2017-01-17 NOTE — Patient Outreach (Signed)
[facility]) Care Management  [place]   01/17/2017  Rick Cruz Jan 12, 1946 [id number]  Late entry for 01/17/17.    Subjective:  Patient was referred to [place] by [place] for medication review due to patient report of dizziness.    Patient has a past medical history significant for hypertension, CAD, paroxysmal atrial fibrillation, obstructive sleep apnea, type 2 diabetes mellitus, dyslipidemia.    Phone call to patient, HIPAA details verified.  Explained purpose of call and offered a home visit to review medications with patient---he reports he had his medications by him and could review them over the phone.  Per [facility] RN, patient gets medications from [place] in blister packaging.    Objective:   Current Medications: Current Outpatient Prescriptions  Medication Sig Dispense Refill  . acetaminophen (TYLENOL) 325 MG tablet Take 2 tablets (650 mg total) by mouth every 6 (six) hours as needed for mild pain or fever.    Marland Kitchen albuterol (PROVENTIL HFA;VENTOLIN HFA) 108 (90 BASE) MCG/ACT inhaler Inhale 2 puffs into the lungs every 6 (six) hours as needed for wheezing or shortness of breath. 1 Inhaler 2  . albuterol (PROVENTIL) (2.5 MG/3ML) 0.083% nebulizer solution Take 3 mLs (2.5 mg total) by nebulization every 6 (six) hours as needed for wheezing or shortness of breath. 75 mL 0  . apixaban (ELIQUIS) 5 MG TABS tablet Take 1 tablet (5 mg total) by mouth 2 (two) times daily. 60 tablet 1  . bisacodyl (DULCOLAX) 10 MG suppository Place 1 suppository (10 mg total) rectally daily. 12 suppository 0  . cholecalciferol (VITAMIN D) 1000 UNITS tablet Take 2,000 Units by mouth daily.    . Cinnamon 500 MG capsule Take 1,000 mg by mouth every morning.     . citalopram (CELEXA) 40 MG tablet Take 40 mg by mouth daily.    Marland Kitchen diltiazem (CARDIZEM CD) 120 MG 24 hr capsule Take 1 capsule (120 mg total) by mouth daily.    Marland Kitchen docusate sodium (COLACE) 100 MG capsule  Take 100 mg by mouth 2 (two) times daily.    . furosemide (LASIX) 20 MG tablet Take 3 tablets (60 mg total) by mouth 2 (two) times daily. 30 tablet   . gabapentin (NEURONTIN) 300 MG capsule Take 1 capsule (300 mg total) by mouth 2 (two) times daily. 60 capsule 0  . insulin aspart (NOVOLOG) 100 UNIT/ML injection Inject 8 Units into the skin 3 (three) times daily with meals. 10 mL 11  . insulin aspart (NOVOLOG) 100 UNIT/ML injection Inject 0-9 Units into the skin 3 (three) times daily with meals. Sliding scale CBG 70 - 120: 0 units CBG 121 - 150: 1 unit,  CBG 151 - 200: 2 units,  CBG 201 - 250: 3 units,  CBG 251 - 300: 5 units,  CBG 301 - 350: 7 units,  CBG 351 - 400: 9 units   CBG > 400: 9 units and notify your MD 10 mL 11  . insulin detemir (LEVEMIR) 100 UNIT/ML injection Inject 24 Units into the skin daily.    Marland Kitchen loperamide (IMODIUM) 2 MG capsule Take 1 capsule (2 mg total) by mouth 3 (three) times daily as needed for diarrhea or loose stools. 30 capsule 0  . loratadine (CLARITIN) 10 MG tablet Take 10 mg by mouth daily.    . metoprolol tartrate (LOPRESSOR) 25 MG tablet Take 0.5 tablets (12.5 mg total) by mouth 2 (two) times daily. 60 tablet 0  . Naproxen  Sodium (ALEVE PO) Take by mouth.    . pantoprazole (PROTONIX) 40 MG tablet Take 40 mg by mouth 2 (two) times daily.    . simvastatin (ZOCOR) 40 MG tablet Take 40 mg by mouth every evening.    . terazosin (HYTRIN) 1 MG capsule Take 1 capsule (1 mg total) by mouth at bedtime. 30 capsule 0  . traZODone (DESYREL) 150 MG tablet Take by mouth.     No current facility-administered medications for this visit.     Functional Status: In your present state of health, do you have any difficulty performing the following activities: 01/09/2017 12/27/2016  Hearing? N -  Vision? - Y  Difficulty concentrating or making decisions? N -  Walking or climbing stairs? Y Y  Dressing or bathing? Y -  Doing errands, shopping? Y Y  Some recent data might be hidden     Fall/Depression Screening: Fall Risk  12/21/2016  Falls in the past year? Yes  Number falls in past yr: 2 or more  Injury with Fall? No  Risk Factor Category  High Fall Risk  Risk for fall due to : Impaired balance/gait;Impaired mobility  Follow up Falls evaluation completed;Education provided;Falls prevention discussed   PHQ 2/9 Scores 01/09/2017  PHQ - 2 Score 4  PHQ- 9 Score 9    Assessment:   Medication review per patient report and comparing medication list in this chart.   Drugs sorted by system:  Neurologic/Psychologic: -citalopram -gabapentin -trazodone  Cardiovascular: -apixaban (Eliquis)  -diltiazem  -furosemide -metoprolol tartrate---patient reports medication was discontinued -simvastatin -terazosin---patient reports medication was discontinued   Pulmonary/Allergy: -albuterol inhaler prn---patient reports he uses about 2 inhalations per day -albuterol neb prn---patient reports he doesn't use often -loratadine---patient reports he stopped using  Gastrointestinal: -bisacodyl---patient reports not using -docusate---patient reports using prn  -loperamide---patient reports using prn -pantoprazole  Endocrine: -insulin aspart (Novolog) patient reports he uses in insulin pump -insulin detemir (Levemir) on medication list---patient reports no longer using  Pain: -acetaminophen prn -naproxen---patient reports he takes twice daily  Vitamins/Minerals: -vitamin D  Miscellaneous: -cinnamon  Drug interactions:   -recommend max dose of simvastatin 10 mg daily when used concomitantly with diltiazem due to increased risk of ADRs associated with simvastatin   -increased risk of bleeding with concomitant use of Eliquis and naproxen  Other issues noted:  -patient reports metoprolol tartrate and terazosin were recently discontinued---recommend close monitoring of patient's BP if these medications were discontinued    Patient declined questions related to  his medications and declined need for a pharmacist home visit at this time.    Counseled patient to monitor his blood pressure closely and report symptoms of dizziness to his medical providers.    Plan:  Will route this note to PCP.   Will place follow-up call to patient in next 2 weeks.   Karrie Meres, PharmD, [place] [phone]

## 2017-01-23 ENCOUNTER — Other Ambulatory Visit: Payer: Self-pay | Admitting: *Deleted

## 2017-01-23 NOTE — Patient Outreach (Signed)
Rick Cruz) Care Management   01/23/2017  Rick Cruz June 02, 1946 [id number]  Rick Cruz is an 71 y.o. male Rick Cruz is an 71 y.o. 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 Rick Cruz was seen in April 2018 by Rick Cruz, neurologist for dizziness. Dr Rick Cruz recommended tapering off Neurontin, stopping Antivert, staying active, chair exercises and review of medications with pcp possibly contributing to lightheadedness. During the neurology visit Rick Cruz was noted with hypotension and decrease in oxygen. Rick Cruz was referred to [facility] for related oxygen DME needs and a [facility] CM home outreach visit need prompted from concerns noted during neurological visit and request to [facility] [employer] for continuous 3 L of oxygen recommended per Rick Cruz by the neurologist, Rick Cruz. During the neurological visit Rick Cruz gave out of oxygen and Rick [facility] "[facility] tanks" before Rick Cruz could leave the [name] pulmonologist office to return home to [name]. Rick Cruz reports the oxygen tanks were large and cumbersome to carry using his rollator and Rick Cruz has past back concerns that causes difficulty with transporting large oxygen tanks. [facility] CM resolved oxygen needs during April 2018 visits   Subjective:   Objective:   Review of Systems  Constitutional: Negative for chills, diaphoresis, fever, malaise/fatigue and weight loss.  HENT: Negative.  Negative for congestion, ear discharge, ear pain, hearing loss, nosebleeds, sinus pain, sore throat and tinnitus.   Eyes: Negative for blurred vision, double vision, photophobia, pain, discharge and redness.   Respiratory: Positive for shortness of breath. Negative for cough, hemoptysis, sputum production, wheezing and stridor.   Cardiovascular: Negative.  Negative for chest pain, palpitations, orthopnea, claudication, leg swelling and PND.  Gastrointestinal: Negative for abdominal pain, blood in stool, constipation, diarrhea, heartburn, melena, nausea and vomiting.  Genitourinary: Negative for dysuria, flank pain, frequency, hematuria and urgency.  Musculoskeletal: Negative for back pain, falls, joint pain, myalgias and neck pain.  Skin: Negative for itching and rash.  Neurological: Positive for dizziness and weakness. Negative for tingling, tremors, sensory change, speech change, focal weakness, seizures, loss of consciousness and headaches.  Endo/Heme/Allergies: Negative for environmental allergies and polydipsia. Bruises/bleeds easily.  Psychiatric/Behavioral: Negative for depression, hallucinations, memory loss, substance abuse and suicidal ideas. The patient is not nervous/anxious and does not have insomnia.     Physical Exam  Constitutional: He appears well-developed and well-nourished.  HENT:  Head: Normocephalic and atraumatic.  Eyes: Pupils are equal, round, and reactive to light.  Neck: Normal range of motion. Neck supple.  Cardiovascular: Normal rate and normal heart sounds.   Respiratory: Breath sounds normal.  GI: Soft. Bowel sounds are normal.  Musculoskeletal: Normal range of motion.  Neurological: He is alert.  Skin: Skin is warm and dry.  Psychiatric: He has a normal mood and affect. His behavior is normal. Judgment and thought content normal.    Encounter Medications:   Outpatient Encounter Prescriptions as of 01/23/2017  Medication Sig  . acetaminophen (TYLENOL) 325 MG tablet Take 2 tablets (650 mg total) by mouth every 6 (six) hours as needed for mild pain or fever. (Patient not taking: Reported on 01/21/2017)  . albuterol (PROVENTIL HFA;VENTOLIN HFA) 108 (90 BASE) MCG/ACT  inhaler Inhale 2 puffs into  the lungs every 6 (six) hours as needed for wheezing or shortness of breath.  Marland Kitchen albuterol (PROVENTIL) (2.5 MG/3ML) 0.083% nebulizer solution Take 3 mLs (2.5 mg total) by nebulization every 6 (six) hours as needed for wheezing or shortness of breath. (Patient not taking: Reported on 01/21/2017)  . apixaban (ELIQUIS) 5 MG TABS tablet Take 1 tablet (5 mg total) by mouth 2 (two) times daily.  . bisacodyl (DULCOLAX) 10 MG suppository Place 1 suppository (10 mg total) rectally daily. (Patient not taking: Reported on 01/21/2017)  . cholecalciferol (VITAMIN D) 1000 UNITS tablet Take 2,000 Units by mouth daily.  . Cinnamon 500 MG capsule Take 1,000 mg by mouth every morning.   . citalopram (CELEXA) 40 MG tablet Take 40 mg by mouth daily.  Marland Kitchen diltiazem (CARDIZEM CD) 120 MG 24 hr capsule Take 1 capsule (120 mg total) by mouth daily.  Marland Kitchen docusate sodium (COLACE) 100 MG capsule Take 100 mg by mouth 2 (two) times daily as needed.   . furosemide (LASIX) 20 MG tablet Take 3 tablets (60 mg total) by mouth 2 (two) times daily.  Marland Kitchen gabapentin (NEURONTIN) 300 MG capsule Take 1 capsule (300 mg total) by mouth 2 (two) times daily.  . insulin aspart (NOVOLOG) 100 UNIT/ML injection Inject 8 Units into the skin 3 (three) times daily with meals.  . insulin aspart (NOVOLOG) 100 UNIT/ML injection Inject 0-9 Units into the skin 3 (three) times daily with meals. Sliding scale CBG 70 - 120: 0 units CBG 121 - 150: 1 unit,  CBG 151 - 200: 2 units,  CBG 201 - 250: 3 units,  CBG 251 - 300: 5 units,  CBG 301 - 350: 7 units,  CBG 351 - 400: 9 units   CBG > 400: 9 units and notify your MD  . insulin detemir (LEVEMIR) 100 UNIT/ML injection Inject 24 Units into the skin daily.  Marland Kitchen loperamide (IMODIUM) 2 MG capsule Take 1 capsule (2 mg total) by mouth 3 (three) times daily as needed for diarrhea or loose stools. (Patient not taking: Reported on 01/21/2017)  . loratadine (CLARITIN) 10 MG tablet Take 10 mg by mouth daily.  .  metoprolol tartrate (LOPRESSOR) 25 MG tablet Take 0.5 tablets (12.5 mg total) by mouth 2 (two) times daily. (Patient not taking: Reported on 01/21/2017)  . Naproxen Sodium (ALEVE PO) Take 1 tablet by mouth 2 (two) times daily.   . pantoprazole (PROTONIX) 40 MG tablet Take 40 mg by mouth 2 (two) times daily.  . simvastatin (ZOCOR) 40 MG tablet Take 40 mg by mouth every evening.  . terazosin (HYTRIN) 1 MG capsule Take 1 capsule (1 mg total) by mouth at bedtime. (Patient not taking: Reported on 01/21/2017)  . traZODone (DESYREL) 100 MG tablet Take 100 mg by mouth at bedtime.    No facility-administered encounter medications on file as of 01/23/2017.     Functional Status:   In your present state of health, do you have any difficulty performing the following activities: 01/09/2017 12/27/2016  Hearing? N -  Vision? - Y  Difficulty concentrating or making decisions? N -  Walking or climbing stairs? Y Y  Dressing or bathing? Y -  Doing errands, shopping? Y Y  Some recent data might be hidden    Fall/Depression Screening:    Fall Risk  12/21/2016  Falls in the past year? Yes  Number falls in past yr: 2 or more  Injury with Fall? No  Risk Factor Category  High Fall Risk  Risk  for fall due to : Impaired balance/gait;Impaired mobility  Follow up Falls evaluation completed;Education provided;Falls prevention discussed   PHQ 2/9 Scores 01/09/2017  PHQ - 2 Score 4  PHQ- 9 Score 9    Assessment:    Rick Ruddock is a pleasant gentleman presented today sitting in his recliner elevating his legs with various tables for food, drink, remotes and electronics around him.  Rick Kille speech was clearer today and he is alert and oriented x 3. He is wearing 3 L oxygen [place]  Initially Rick Tuckerman updated [facility] CM on her mental health concerns and that Rick Yuhas sill does not feel the need to participate in [facility], therapy, Grief share or hospice group   Noncompliance with medical treatment Rick Frericks shared her  concerns about statements made during pt 01/14/17 CV visit. Reports Dr Einar Gip had voiced frustrations with changes in Rick Garrelts health and delay in him being consulted to see him again.  Reports Rick Thrun did not share with Dr Jackie Plum, CV that he was taking on 12.5 mg of metoprolol and when Dr Einar Gip requested Rick Bekker "take half a tablet" Rick Hoaglin "took that to mean stop taking it since he was already taking a half tablet" [facility] CM called Dr Einar Gip office and spoke with his nurse, Tilda Burrow, to discuss this and had her to clarify that Dr Einar Gip wanted to discontinue cardizem but for Rick Gaymon to continue to take 12.5 mg of metoprolol and to come to the office on 02/05/17 at 1 pm for an EKG and a follow visit with Dr Einar Gip.for orthostatic hypotension after noting issues with BP when going from sitting to standing and to check his other cardiac medicines in question that may cause dizziness, Dr Einar Gip fax number is [phone]. Rick Alviar also shared that Rick Liby is still not being compliant with use of his cpap. Rick Blasdell missed an appointment with Dr Luan Pulling related to tardiness and process of it being tasking to get out of the home now that Rick Schauer is assisting with care vs son.  They confirmed with [facility] CM that the pulmonology appointment was rescheduled to Feb 07 2017 on 01/23/17 morning when Dr Luan Pulling' staff called. [facility] CM re reviewed [facility] personal care services [facility]) options to include  a charge of $17/hr with a minimal of 2 hours required.  When discussed this is an option to assist with Rick Liby home care and getting to doctor appointments as scheduled now that his sons are not available and it is tasking for Rick Ellard Artis to assist related to her back injuries and grieving concerns, Rick Stamos stated " I think it is a good idea .  Acute medical needs  Rick Rahmani again reports issues with having empty oxygen tanks when going to an appointment in the last 2 weeks and having to use the large emergency tank of oxygen when  arrived home.  [facility] CM called and consulted [facility] at [facility] [phone]) to get [facility] staff to return to Rick Goodlin home to re educate Rick and Rick Crean on the process to add oxygen to portable tanks to take to doctor appointments and a future trip out of town. He and Rick Shabazz voiced understanding. Rick Haydel thinks the tanks filled but questions if the oxygen leaks out of the tank if it is left open  Elta Guadeloupe did confirm with them that oxygen can leak out and reminded then to always close the tank  Rick Kneale asked about how to make sure the tank is closed and was informed to switch the knob to the left to open it and to the right to close the tank.Elta Guadeloupe confirms the tank has to be in a closed position when it is being filled - only 3 little tanks to be taken when they go to [place].   Each tank lasts 2  hours each They are planning a trip to see Saralyn Pilar in [place] but are afraid the tanks may not last let them.Elta Guadeloupe discussed that they can call for extra tanks before traveling to let [facility] provide rental tanks at a charge of $150 for traveling oxygen and they prefer a 2 week notice . Elta Guadeloupe states it would be easier and cost less if they would just put the  concentrator in their [employer].  Mark states 3 tanks should last 7  hours and he would send an [facility] staff out this week  [facility] Cruz CM  discussed the need for an [facility] staff to write down steps for them to refer back to. Mark instructed CM how to check on emergency tank (which included turning the black knob to left to open  and it will take arrow to how much oxygen present Reset by turn top black knob to right to close and then small black knob goes up and then turn small black knob to down towards you to 3 L to prepare tank for next use) Not using cpap compliantly  Encouraged them to text instructions on both of their phones  Rick and Rick Stueve are no longer expressing interest in searching for another company  that may be able to provide them with a portable oxygen tank that supplies 3+ Liters of oxygen. They are satisfied with [facility] services  During the home visit Rick Ose ambulated to the restroom without difficulties. Rick Adduci confirmed he signed up with [email or web address]  Rick Bensinger' mother visited at the end of the home visit again today   Plan:  To follow up with Rick Kirschbaum in 1-2 weeks  [facility] CM Care Plan Problem One     Most Recent Value  Care Plan Problem One  Grief over loss of son who passed 8 months ago & community resources  Role Documenting the Problem One  Clinical Social Worker  Care Plan for Problem One  Active  [facility] CM Short Term Goal #1   Patient & family will communicate with CSW with regards to community resources related to grief support & caregiver support  [facility] CM Short Term Goal #1 Start Date  01/04/17  Interventions for Short Term Goal #1  CSW provided patient & wife with resources for Aging, [place] for caregiver support as well as [facility] brochure for grief support       Joelene Millin L. Lavina Hamman, RN, BSN, [place] [facility] [phone]

## 2017-01-25 ENCOUNTER — Other Ambulatory Visit: Payer: Self-pay | Admitting: *Deleted

## 2017-01-25 DIAGNOSIS — J449 Chronic obstructive pulmonary disease, unspecified: Secondary | ICD-10-CM | POA: Diagnosis not present

## 2017-01-25 DIAGNOSIS — S82843A Displaced bimalleolar fracture of unspecified lower leg, initial encounter for closed fracture: Secondary | ICD-10-CM | POA: Diagnosis not present

## 2017-01-25 DIAGNOSIS — G4733 Obstructive sleep apnea (adult) (pediatric): Secondary | ICD-10-CM | POA: Diagnosis not present

## 2017-01-25 NOTE — Patient Outreach (Addendum)
[place]) Care Management  01/25/2017  Rick Cruz 23-Jul-1946 [id number]   CSW called & spoke with patient's wife, Rick Cruz (ph#: [phone]) to follow-up from initial home visit. Patient's wife reports that she has made an appointment with [facility] and plans to go once a week, but patient declined going - states it would just be too much to get out to go, "it take a lot of effort." Patient's wife reports that patient likes to sleep a lot during the day and is up a lot during the night. CSW also spoke with patient (home ph#: [phone]) who states that he wasn't very interested in going to counseling, whether individual or group but is supportive of his wife going, he states that she does better "talking it out", whereas he copes better by not talking about it. Patient's wife had informed CSW that they are thinking about going on vacation with their other son, Rick Cruz in a few weeks. CSW encouraged patient & wife that perhaps going on vacation together would be good as wife states that the only time they spend together out of the house is going to doctors appointments.   CSW will follow-up with patient & wife within a month to see how patient does with vacation and processing grief/provide supportive listening.    [name], LCSW [facility]  Clinical Social Worker cell #: [phone]

## 2017-01-28 ENCOUNTER — Ambulatory Visit: Payer: Self-pay | Admitting: Pharmacist

## 2017-01-31 ENCOUNTER — Other Ambulatory Visit: Payer: Self-pay | Admitting: Pharmacist

## 2017-01-31 NOTE — Patient Outreach (Signed)
[facility] Care Management  01/31/2017  Rick Cruz 1946-09-13 [id number]   Follow-up call to patient regarding his medication related questions.  HIPAA details verified.  Per review of notes in chart from [facility], there have been medication adjustments made.  Patient questions if he is to still be taking furosemide, he reports it was not sent by his pharmacy.  When asked what pharmacy he uses, he states, he has a Architectural technologist app on his phone but he doesn't know what pharmacy actually fills his medications.   Patient abruptly said he had to go and wanted a call back---offered a call back tomorrow and he said he wouldn't be home.  Advised patient will call back next week.   Phone call to [place] RN [name], who informed [facility] Pharmacist patient changed his pharmacy to a mail order pharmacy who places his medication in a daily dose packages.    Plan:  Will call patient back next week as he requests.    Will attempt to determine which pharmacy patient is filling his medications from now as well.    Karrie Meres, PharmD, [place] [phone]

## 2017-02-02 DIAGNOSIS — J9620 Acute and chronic respiratory failure, unspecified whether with hypoxia or hypercapnia: Secondary | ICD-10-CM | POA: Diagnosis not present

## 2017-02-02 DIAGNOSIS — J449 Chronic obstructive pulmonary disease, unspecified: Secondary | ICD-10-CM | POA: Diagnosis not present

## 2017-02-04 ENCOUNTER — Other Ambulatory Visit: Payer: Self-pay | Admitting: Pharmacist

## 2017-02-04 NOTE — Patient Outreach (Signed)
[facility]) Care Management  02/04/2017  Rick Cruz [birth year] [id number]  Unsuccessful phone outreach to patient, HIPAA compliant message left requesting return call.    Plan:  If no return call, will make another outreach attempt in the next week.   Karrie Meres, PharmD, [place] [phone]

## 2017-02-05 DIAGNOSIS — E1151 Type 2 diabetes mellitus with diabetic peripheral angiopathy without gangrene: Secondary | ICD-10-CM | POA: Diagnosis not present

## 2017-02-05 DIAGNOSIS — I10 Essential (primary) hypertension: Secondary | ICD-10-CM | POA: Diagnosis not present

## 2017-02-05 DIAGNOSIS — R0602 Shortness of breath: Secondary | ICD-10-CM | POA: Diagnosis not present

## 2017-02-05 DIAGNOSIS — I951 Orthostatic hypotension: Secondary | ICD-10-CM | POA: Diagnosis not present

## 2017-02-06 ENCOUNTER — Other Ambulatory Visit: Payer: Self-pay | Admitting: *Deleted

## 2017-02-06 ENCOUNTER — Other Ambulatory Visit: Payer: Self-pay | Admitting: Pharmacist

## 2017-02-06 NOTE — Patient Outreach (Signed)
[facility]) Care Management  02/06/2017  Rick Cruz [birth year] [id number]  Late entry for 02/06/17.   Second outreach attempt to patient, HIPAA details verified.    Patient reports he is getting medications sent to him in a box, from [place], with each day/time of medication individually packaged.  Offered to complete a home visit with patient to review his medications, and he states he can do it over the phone.    Per patient reports medications include:  Drugs sorted by system:  Neurologic/Psychologic: -citalopram -gabapentin -trazodone  Cardiovascular: -eliquis -simvastatin -furosemide  Pulmonary/Allergy: -albuterol prn   Gastrointestinal: -pantoprazole  Endocrine: -insulin pump  Drug interactions: Increased risk of bleeding/bruising with concomitant naproxen and eliquis.    Other issues noted:  Patient reports Dr Einar Gip discontinued metoprolol tartrate and diltiazem at office visit earlier this week.    Patient denies issues with medication affordability.  He denies issues with taking his medications.  He denies questions/concerns related to his medications.   Care coordination: Given previous confusion with patient and his cardiac medications, placed call to Dr Irven Shelling office to clarify discontinued medications.  Per [name], CMA with Dr Irven Shelling office, metoprolol tartrate and diltiazem were indeed discontinued.    Plan:  As patient denies pharmacy related issues---will close pharmacy case. He has [facility] Pharmacist phone number if new issues arise.    Will fax note to PCP.  Will update [facility] RN [name] of pharmacy case closure.    Karrie Meres, PharmD, [place] [phone]

## 2017-02-06 NOTE — Patient Outreach (Signed)
[facility]) Care Management   02/06/2017  VELMER WOELFEL October 05, 1945 [id number]  HRIDHAAN YOHN is an 71 y.o. 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 Mr Dumont was seen in April 2018 by [name], neurologist for dizziness. Dr Posey Pronto recommended tapering off Neurontin, stopping Antivert, staying active, chair exercises and review of medications with pcp possibly contributing to lightheadedness. During the neurology visit Mr Tibbs was noted with hypotension and decrease in oxygen. Mr Gabler was referred to [facility] for related oxygen DME needs and a [facility]N CM home outreach visit need prompted from concerns noted during neurological visit and request to [facility] [employer] for continuous 3 L of oxygen recommended per Mr and Mrs Turnley by the neurologist, Posey Pronto. During the neurological visit Mr Brunty gave out of oxygen and Mrs Riner had to [facility] "four tanks" before Mr Roddey could leave the [place] pulmonologist office to return home to [place]. Mr Postema reports the oxygen tanks were large and cumbersome to carry using his rollator and Mrs Bickford has past back concerns that causes difficulty with transporting large oxygen tanks. THN CM resolved oxygen needs during April 2018 visits  Subjective: "we went out of town and I enjoyed it but something happened to my tubing on the tank"  " My doctor took me off my blood pressure medicine"  Objective:   BP 120/70 (BP Location: Left Arm, Patient Position: Sitting, Cuff Size: Large)   Pulse 78   Temp 97.6 F (36.4 C) (Oral)   Resp 20   Ht 1.981 m ('6\' 6"'$ )   Wt (!) 301 lb (136.5 kg)   SpO2 92%   BMI 34.78 kg/m   Review of Systems   Constitutional: Negative.  Negative for chills, diaphoresis, fever, malaise/fatigue and weight loss.  HENT: Negative for congestion, ear discharge, ear pain, hearing loss, nosebleeds, sinus pain, sore throat and tinnitus.   Eyes: Negative for blurred vision, double vision, photophobia, pain, discharge and redness.  Respiratory: Negative for cough, hemoptysis, sputum production, shortness of breath, wheezing and stridor.   Cardiovascular: Negative for chest pain, palpitations, orthopnea, claudication, leg swelling and PND.  Gastrointestinal: Negative for abdominal pain, blood in stool, constipation, diarrhea, heartburn, melena, nausea and vomiting.  Genitourinary: Negative for dysuria, flank pain, frequency, hematuria and urgency.  Musculoskeletal: Positive for myalgias. Negative for back pain, falls, joint pain and neck pain.  Skin: Negative for itching and rash.  Neurological: Positive for dizziness. Negative for tingling, tremors, sensory change, speech change, focal weakness, seizures, loss of consciousness, weakness and headaches.  Endo/Heme/Allergies: Negative for environmental allergies and polydipsia. Bruises/bleeds easily.  Psychiatric/Behavioral: Negative for depression, hallucinations, memory loss, substance abuse and suicidal ideas. The patient is not nervous/anxious and does not have insomnia.     Physical Exam  Constitutional: He is oriented to person, place, and time. He appears well-developed and well-nourished.  HENT:  Head: Normocephalic and atraumatic.  Eyes: Pupils are equal, round, and reactive to light.  Neck: Normal range of motion. Neck supple.  Cardiovascular: Normal rate, regular rhythm and normal heart sounds.   Respiratory: Effort normal and breath sounds normal.  GI: Soft. Bowel sounds are normal.  Musculoskeletal: Normal range of motion.  Neurological: He is alert and oriented to person, place, and time.  Skin: Skin is warm and dry.  Psychiatric: He has a  normal mood and affect. His behavior is normal. Judgment and thought content normal.    Encounter Medications:   Outpatient Encounter Prescriptions as of 02/06/2017  Medication Sig Note  . acetaminophen (TYLENOL) 325 MG tablet Take 2 tablets (650 mg total) by mouth every 6 (six) hours as needed for mild pain or fever.   Marland Kitchen albuterol (PROVENTIL HFA;VENTOLIN HFA) 108 (90 BASE) MCG/ACT inhaler Inhale 2 puffs into the lungs every 6 (six) hours as needed for wheezing or shortness of breath.   Marland Kitchen albuterol (PROVENTIL) (2.5 MG/3ML) 0.083% nebulizer solution Take 3 mLs (2.5 mg total) by nebulization every 6 (six) hours as needed for wheezing or shortness of breath.   Marland Kitchen apixaban (ELIQUIS) 5 MG TABS tablet Take 1 tablet (5 mg total) by mouth 2 (two) times daily.   . bisacodyl (DULCOLAX) 10 MG suppository Place 1 suppository (10 mg total) rectally daily.   . cholecalciferol (VITAMIN D) 1000 UNITS tablet Take 2,000 Units by mouth daily.   . Cinnamon 500 MG capsule Take 1,000 mg by mouth every morning.    . citalopram (CELEXA) 40 MG tablet Take 40 mg by mouth daily.   Marland Kitchen diltiazem (CARDIZEM CD) 120 MG 24 hr capsule Take 1 capsule (120 mg total) by mouth daily.   Marland Kitchen docusate sodium (COLACE) 100 MG capsule Take 100 mg by mouth 2 (two) times daily as needed.    . furosemide (LASIX) 20 MG tablet Take 3 tablets (60 mg total) by mouth 2 (two) times daily.   Marland Kitchen gabapentin (NEURONTIN) 300 MG capsule Take 1 capsule (300 mg total) by mouth 2 (two) times daily.   . insulin aspart (NOVOLOG) 100 UNIT/ML injection Inject 8 Units into the skin 3 (three) times daily with meals.   . insulin aspart (NOVOLOG) 100 UNIT/ML injection Inject 0-9 Units into the skin 3 (three) times daily with meals. Sliding scale CBG 70 - 120: 0 units CBG 121 - 150: 1 unit,  CBG 151 - 200: 2 units,  CBG 201 - 250: 3 units,  CBG 251 - 300: 5 units,  CBG 301 - 350: 7 units,  CBG 351 - 400: 9 units   CBG > 400: 9 units and notify your MD   . insulin detemir  (LEVEMIR) 100 UNIT/ML injection Inject 24 Units into the skin daily.   Marland Kitchen loperamide (IMODIUM) 2 MG capsule Take 1 capsule (2 mg total) by mouth 3 (three) times daily as needed for diarrhea or loose stools.   Marland Kitchen loratadine (CLARITIN) 10 MG tablet Take 10 mg by mouth daily.   . metoprolol tartrate (LOPRESSOR) 25 MG tablet Take 0.5 tablets (12.5 mg total) by mouth 2 (two) times daily.   . Naproxen Sodium (ALEVE PO) Take 1 tablet by mouth 2 (two) times daily.    . pantoprazole (PROTONIX) 40 MG tablet Take 40 mg by mouth 2 (two) times daily.   . simvastatin (ZOCOR) 40 MG tablet Take 40 mg by mouth every evening.   . terazosin (HYTRIN) 1 MG capsule Take 1 capsule (1 mg total) by mouth at bedtime. (Patient not taking: Reported on 01/21/2017) 01/23/2017: Stopped by NP, lonnie and placed on saw pametto recently   . traZODone (DESYREL) 100 MG tablet Take 100 mg by mouth at bedtime.     No facility-administered encounter medications on file as of 02/06/2017.     Functional Status:   In your present state of health, do you have  any difficulty performing the following activities: 01/09/2017 12/27/2016  Hearing? N -  Vision? - Y  Difficulty concentrating or making decisions? N -  Walking or climbing stairs? Y Y  Dressing or bathing? Y -  Doing errands, shopping? Y Y  Some recent data might be hidden    Fall/Depression Screening:    Fall Risk  12/21/2016  Falls in the past year? Yes  Number falls in past yr: 2 or more  Injury with Fall? No  Risk Factor Category  High Fall Risk  Risk for fall due to : Impaired balance/gait;Impaired mobility  Follow up Falls evaluation completed;Education provided;Falls prevention discussed   PHQ 2/9 Scores 01/09/2017  PHQ - 2 Score 4  PHQ- 9 Score 9    Assessment:   Mr and Mrs Crisco presented today in their living room.  Both noted with tans and smiling.  Mrs Campoy lying on couch in her pajamas while Mr Frericks was dressed in his recliner.  The home was noted with  increased clutter and Mrs Linden apologized for not cleaning THN CM was informed that his CV asked him to get custom compressions THN showed pt and wife what custom compressions look like and answered questions related to compression hose.  CV MD seen on 02/05/17 Mr Squillace and his wife admits that Mr Mostafa is noncompliant with food intake, has an insulin pump that he "manipulates".  THN CM answered questions about  and, customized  compression hoses Pharmacist, Darl Householder works for Dr [name] has also attempted to assist Mr Boody without success per Mrs Foree. They did go on a trip lately to [facility] and visited their son but states the trip was "way too short" and reports they had to worry about oxygen running out. They called [facility] about "a piece needed on the compressor  Mother, Murray Hodgkins, (who is [facility]) brings him food per his wife. So when Mrs Murray Hodgkins visited today [facility] CM talked to Mrs Murray Hodgkins about the need to encourage and help Mr Pinkerton in making changes to his diet and compliance to lose the weight the CV has ask him to lose.  Has seen Dr Dorris Fetch also Mr Selley was not compliant with having his BP written down as asked by [facility] CM during the last visit nor did he call [facility] CM each day but CV, Ganji,stopped all his BP meds on 02/05/17  See Luan Pulling 02/07/17 0930 Informed The office that pt having difficulty with oxygen and may be late Informed pt of his appt time and encouraged him to check his mail box (as he was informed by [facility] as he called [facility] while CM present in the home that the tubing he needs is in the mail)  Spoke with Dr Luan Pulling while in the home about Mr Lamarque having difficulty with Oxygen and may have difficulty to getting to scheduled appointment  Plan:  To see Mr Corpus in 2 weeks and continue to collaborate with [facility] SW and pharmacist [facility] CM sent information on the new [facility] pharmacy services that is being used now by Mr Bir   [facility] CM Care Plan Problem One     Most  Recent Value  Care Plan Problem One   Knowledge of chronic illness (Chronic respiratory failure, DM2, CAD, CHF, PAF) and oxygen/DME management needs/availabilty   Role Documenting the Problem One  Care Management Coordinator  Care Plan for Problem One  Active  THN Long Term Goal   Over the next 31 days  the patient and family will verbalize knowledge of home care maintanence of chronic medical illnesses and DME needed for home care  [facility] Long Term Goal Start Date  02/06/17  Interventions for Problem One Long Term Goal  re established goal 02/06/17, review of zone symptoms to report to providers, discussed standard values for bp, heart rate, weight gain, etc  THN CM Short Term Goal #1   Over the next 30 days the patient and family will verbalize understanding of zone symptoms to report to providers for chronic illnesses (respiratory failure, CAD, DM, PAF, CHF  THN CM Short Term Goal #1 Start Date  02/06/17  Interventions for Short Term Goal #1  Restablished goal 02/06/17, Review zone symptoms, and normal values for vital signs, weight, provided educational packages,  THN CM Short Term Goal #2   over the next 14-21 days the patient will have DME and possible medical providers needed for management of chronic respiratory illness  THN CM Short Term Goal #2 Start Date  12/27/16  [facility] CM Short Term Goal #2 Met Date  01/09/17  Interventions for Short Term Goal #2  contact DME agencies ([facility], [facility] and other recommended Oxygen DME companies recommended by pulmonologist, contact pcp, request pulmonology consult, assist with coordination of DME        Kimberly L. Lavina Hamman, RN, BSN, [place] Care Management [phone]

## 2017-02-12 ENCOUNTER — Other Ambulatory Visit: Payer: Self-pay | Admitting: *Deleted

## 2017-02-12 NOTE — Patient Outreach (Signed)
[facility]) Care Management  02/12/2017  TRUST LEH 1946-06-18 [id number]  Care coordination  [facility] CM received 2 calls from Mr Nanna without a voice message left.  THN CM returned a call to him twice before she was able to get him on the line.   Mr Mak is inquiring about the next [facility] CM visit and "how to I get some help around here?"  During last home visits [facility] CM has discussed personal care services to assist him in the home and with getting to doctor visits etc since Mrs Trainer is stating she is not able to assist related to her medical and mental issues at this time.  Mrs Rybacki had reports calls and messages from [facility] after [facility] CM had called in a referral during a home visit.  Mr and Mrs Baize have not responded to [facility] calls or messages left Mr Devery denied any medical acute issues at this time   Plans THN Cm provided Mr Ranganathan with his scheduled visit time and gave him the number to [facility] to call to speak with Domenica Fail as [phone] [place] reminded him that personal care services will be an out of pocket expense for him and discussed [facility] had left him messages without responses  Joelene Millin L. Lavina Hamman, RN, BSN, [place] Care Management [phone]

## 2017-02-19 ENCOUNTER — Other Ambulatory Visit: Payer: Self-pay | Admitting: *Deleted

## 2017-02-19 NOTE — Patient Outreach (Signed)
[place] [facility]) Care Management  02/19/2017  Rick Cruz [birth year] [id number]   [facility] CM spoke with [name] to check on his status and to see if he followed up with [facility].  He has not followed up with [facility] but evidence of him not answering the question and going to another subject when questioned.  He is scheduled for and appt to see his pcp this week and THN CM will attempt to see him at that appt if possible  Kimberly L. Lavina Hamman, RN, BSN, [place] Care Management [phone]

## 2017-02-20 ENCOUNTER — Other Ambulatory Visit: Payer: Self-pay | Admitting: *Deleted

## 2017-02-20 NOTE — Patient Outreach (Signed)
[facility]) Care Management  02/20/2017  Rick Cruz [birth year] [id number]  Rick Cruz did not show up for to his pcp to get labs completed in preparation for his next week appointment He was suppose to meet [facility] CM at pcp office at 10 am on 02/20/17  [facility] CM called and left his wife a voice message when he did not show for the appointment but did not receive a return call.    THN CM left another message at his home number and has been unable to leave voice messages on his mobile number all day. Rick Cruz reports he is having technical difficulties with his mobile number when Cm able to reach him on his wife's mobile number  Apologizes for missing pcp OV, labs and states he may have to try to get labs done on 02/21/17   Acute issues High cbg 200-400 Denies altering from his diet  Wants to switch to [facility] endocrinologist in [place] Discussed will need a referral to [place] from pcp   Scheduled pcp OV on 02/27/17 Call [facility] but reports can not afford out of pocket personal care services at this time related wife receiving behavior/counseling services that is costing $45 for each session and she is seeing counselor 1-2 times a week recently.   Confirms dm 2 and on insulin pump, taking novolog tid levemir 26 units at night  Oxygen sat 90% and above  Reports May 17 2017 is the anniversary of son death and today his wife has had "four crying episodes" and they are scheduled to have a balloon release this evening at 9 pm as a memorial to him with the help of patrick, son  Plans to contact Rick [place] in 2 weeks for follow up   [facility] CM Care Plan Problem One     Most Recent Value  Care Plan Problem One   Knowledge of chronic illness (Chronic respiratory failure, DM2, CAD, CHF, PAF) and oxygen/DME management needs/availabilty   Role Documenting the Problem One  Care Management Coordinator  Care Plan for Problem One  Active  THN Long Term Goal   Over the next 31 days the patient and family  will verbalize knowledge of home care maintanence of chronic medical illnesses and DME needed for home care  [facility] Long Term Goal Start Date  02/06/17  Interventions for Problem One Long Term Goal  re established goal 02/06/17, review of zone symptoms to report to providers, discussed standard values for bp, heart rate, weight gain, etc  THN CM Short Term Goal #1   Over the next 30 days the patient and family will verbalize understanding of zone symptoms to report to providers for chronic illnesses (respiratory failure, CAD, DM, PAF, CHF  THN CM Short Term Goal #1 Start Date  02/06/17  Interventions for Short Term Goal #1  Restablished goal 02/06/17, Review zone symptoms, and normal values for vital signs, weight, provided educational packages,  THN CM Short Term Goal #2   over the next 14-21 days the patient will have DME and possible medical providers needed for management of chronic respiratory illness  THN CM Short Term Goal #2 Start Date  12/27/16  [facility] CM Short Term Goal #2 Met Date  01/09/17  Interventions for Short Term Goal #2  contact DME agencies ([facility], [facility] and other recommended Oxygen DME companies recommended by pulmonologist, contact pcp, request pulmonology consult, assist with coordination of DME        Joelene Millin  Yetta Glassman, RN, BSN, CCM [facility] Care Management [phone]

## 2017-02-21 ENCOUNTER — Other Ambulatory Visit: Payer: Self-pay | Admitting: *Deleted

## 2017-02-21 NOTE — Patient Outreach (Signed)
[facility]) Care Management  02/21/2017  Rick Cruz March 15, 1946 [id number]   CSW called & spoke with patient & wife via phone to follow-up on community resources. Patient continues to decline grief counseling resources. Per RNCM, [name] - patient did not show up to his appointment for lab work yesterday at Dr. Julianne Rice office, wife reports that they were not able to get out yesterday as it was their deceased son's birthday and they were having a memorial service at the house. Patient's wife continues to go to counseling at [employer]. Patient & wife report that they have no further social work needs, CSW will close case & inform RNCM, [name].    [name], LCSW [facility]  Clinical Social Worker cell #: [phone]

## 2017-02-25 DIAGNOSIS — S82843A Displaced bimalleolar fracture of unspecified lower leg, initial encounter for closed fracture: Secondary | ICD-10-CM | POA: Diagnosis not present

## 2017-02-25 DIAGNOSIS — J449 Chronic obstructive pulmonary disease, unspecified: Secondary | ICD-10-CM | POA: Diagnosis not present

## 2017-02-25 DIAGNOSIS — G4733 Obstructive sleep apnea (adult) (pediatric): Secondary | ICD-10-CM | POA: Diagnosis not present

## 2017-02-27 DIAGNOSIS — E7800 Pure hypercholesterolemia, unspecified: Secondary | ICD-10-CM | POA: Diagnosis not present

## 2017-02-27 DIAGNOSIS — Z794 Long term (current) use of insulin: Secondary | ICD-10-CM | POA: Diagnosis not present

## 2017-02-27 DIAGNOSIS — I10 Essential (primary) hypertension: Secondary | ICD-10-CM | POA: Diagnosis not present

## 2017-02-27 DIAGNOSIS — E118 Type 2 diabetes mellitus with unspecified complications: Secondary | ICD-10-CM | POA: Diagnosis not present

## 2017-02-28 DIAGNOSIS — E119 Type 2 diabetes mellitus without complications: Secondary | ICD-10-CM | POA: Diagnosis not present

## 2017-03-05 ENCOUNTER — Other Ambulatory Visit: Payer: Self-pay | Admitting: *Deleted

## 2017-03-05 DIAGNOSIS — J9620 Acute and chronic respiratory failure, unspecified whether with hypoxia or hypercapnia: Secondary | ICD-10-CM | POA: Diagnosis not present

## 2017-03-05 DIAGNOSIS — E1142 Type 2 diabetes mellitus with diabetic polyneuropathy: Secondary | ICD-10-CM | POA: Diagnosis not present

## 2017-03-05 DIAGNOSIS — L603 Nail dystrophy: Secondary | ICD-10-CM | POA: Diagnosis not present

## 2017-03-05 DIAGNOSIS — J449 Chronic obstructive pulmonary disease, unspecified: Secondary | ICD-10-CM | POA: Diagnosis not present

## 2017-03-05 NOTE — Patient Outreach (Signed)
[facility]) Care Management  03/05/2017  Rick Cruz 06/12/1946 [id number]   Care coordination   [facility] CM called wife number no answer THN CM called and spoke with pt at his home number He has an 2:30 pm appt with Dr Caprice Beaver, Podiatrist to cut toes today but is on his way to lunch Reports doing well at this time  Discussed future visits  Plan to see pt this week at MD office or home  [place]. Lavina Hamman, RN, BSN, [place] Care Management [phone]

## 2017-03-06 ENCOUNTER — Encounter: Payer: Self-pay | Admitting: *Deleted

## 2017-03-06 ENCOUNTER — Other Ambulatory Visit: Payer: Self-pay | Admitting: *Deleted

## 2017-03-06 NOTE — Patient Outreach (Signed)
[facility]) Care Management   03/06/2017  Rick Cruz [birth year] [id number]  Rick Cruz is an 71 y.o. 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 Rick Cruz was seen in April 2018by Rick Cruz, neurologist for dizziness. Dr Allena Katz recommended tapering off Neurontin, stopping Antivert, staying active, chair exercises and review of medications with pcp possibly contributing to lightheadedness. During the neurology visit Rick Cruz was noted with hypotension and decrease in oxygen. Rick Cruz was referred to [facility] for related oxygen DME needs and a [facility] CM home outreach visit need prompted from concerns noted during neurological visit and request to [employer] for continuous 3 L of oxygen recommended per Rick and Mrs Isadore by the neurologist, Allena Katz. During the neurological visit Rick Stiff gave out of oxygen and [name] had to [facility] "four tanks" before Rick Cruz could leave the [name] pulmonologist office to return home to [place]. Rick Buffone reports the oxygen tanks were large and cumbersome to carry using his rollator and Mrs Lienhard has past back concerns that causes difficulty with transporting large oxygen tanks. [facility] CM resolved oxygen needs during April 2018 visits  This is the fifth scheduled home visit for Rick Cruz   Subjective:  "I have had drainage from my right eye in the mornings"  Objective:   BP 126/62   Pulse 95   Temp 97.2 F (36.2 C) (Oral)   Resp 20   SpO2 92%  Review of Systems  Constitutional: Negative for chills, diaphoresis, fever, malaise/fatigue and weight loss.  HENT: Negative for congestion, ear discharge, ear pain, hearing loss,  nosebleeds, sinus pain, sore throat and tinnitus.   Eyes: Positive for blurred vision and discharge. Negative for double vision, photophobia, pain and redness.  Respiratory: Negative for cough, hemoptysis, sputum production, shortness of breath, wheezing and stridor.   Cardiovascular: Negative for chest pain, palpitations, orthopnea, claudication, leg swelling and PND.  Gastrointestinal: Negative for abdominal pain, blood in stool, constipation, diarrhea, heartburn, melena, nausea and vomiting.  Genitourinary: Negative for dysuria, frequency, hematuria and urgency.  Musculoskeletal: Negative for back pain, falls, joint pain, myalgias and neck pain.  Skin: Negative for itching and rash.       Noted on both legs spots from him scratching   Neurological: Positive for dizziness and headaches. Negative for tingling, tremors, sensory change, speech change, focal weakness, seizures, loss of consciousness and weakness.       Not much dizziness as before when on BP medications Still with headaches, leg and neck aches States neurontin not helping  Endo/Heme/Allergies: Negative for environmental allergies and polydipsia. Bruises/bleeds easily.  Psychiatric/Behavioral: Negative for depression, hallucinations, memory loss, substance abuse and suicidal ideas. The patient is not nervous/anxious and does not have insomnia.     Physical Exam  Constitutional: He is oriented to person, place, and time. He appears well-developed and well-nourished.  HENT:  Head: Normocephalic and atraumatic.  Right Ear: External ear normal.  Left Ear: External ear normal.  Eyes: Conjunctivae are normal. Pupils are equal, round, and reactive to light.  Right eye affected by stroke now with drainage in am and redness in the outer corner.  Denies pain to eyes  Uses refresh eye gtts   Neck: Normal range of motion. Neck  supple.  Cardiovascular: Normal rate, regular rhythm, normal heart sounds and intact distal pulses.    Respiratory: Effort normal and breath sounds normal.  GI: Soft.  Musculoskeletal: Normal range of motion.  Neurological: He is alert and oriented to person, place, and time.  Skin: Skin is warm and dry.  Psychiatric: He has a normal mood and affect. His behavior is normal. Judgment and thought content normal.    Encounter Medications:   Outpatient Encounter Prescriptions as of 03/06/2017  Medication Sig Note  . albuterol (PROVENTIL HFA;VENTOLIN HFA) 108 (90 BASE) MCG/ACT inhaler Inhale 2 puffs into the lungs every 6 (six) hours as needed for wheezing or shortness of breath.   Marland Kitchen albuterol (PROVENTIL) (2.5 MG/3ML) 0.083% nebulizer solution Take 3 mLs (2.5 mg total) by nebulization every 6 (six) hours as needed for wheezing or shortness of breath.   Marland Kitchen apixaban (ELIQUIS) 5 MG TABS tablet Take 1 tablet (5 mg total) by mouth 2 (two) times daily.   . cholecalciferol (VITAMIN D) 1000 UNITS tablet Take 2,000 Units by mouth daily.   . Cinnamon 500 MG capsule Take 1,000 mg by mouth every morning.    . citalopram (CELEXA) 40 MG tablet Take 40 mg by mouth daily.   Marland Kitchen docusate sodium (COLACE) 100 MG capsule Take 100 mg by mouth 2 (two) times daily as needed.    . furosemide (LASIX) 20 MG tablet Take 3 tablets (60 mg total) by mouth 2 (two) times daily.   Marland Kitchen gabapentin (NEURONTIN) 300 MG capsule Take 1 capsule (300 mg total) by mouth 2 (two) times daily. 03/06/2017: Pt now states he is taking Neurontin 300 mg qid not bid   . loperamide (IMODIUM) 2 MG capsule Take 1 capsule (2 mg total) by mouth 3 (three) times daily as needed for diarrhea or loose stools.   . Naproxen Sodium (ALEVE PO) Take 1 tablet by mouth 2 (two) times daily.    . pantoprazole (PROTONIX) 40 MG tablet Take 40 mg by mouth 2 (two) times daily.   . saw palmetto 160 MG capsule Take 160 mg by mouth 2 (two) times daily.   . simvastatin (ZOCOR) 40 MG tablet Take 40 mg by mouth every evening.   . traZODone (DESYREL) 100 MG tablet Take 100 mg by  mouth at bedtime.    . insulin aspart (NOVOLOG) 100 UNIT/ML injection Inject 8 Units into the skin 3 (three) times daily with meals. (Patient not taking: Reported on 03/06/2017) 03/06/2017: Pt states he is now on an insulin pump  . insulin aspart (NOVOLOG) 100 UNIT/ML injection Inject 0-9 Units into the skin 3 (three) times daily with meals. Sliding scale CBG 70 - 120: 0 units CBG 121 - 150: 1 unit,  CBG 151 - 200: 2 units,  CBG 201 - 250: 3 units,  CBG 251 - 300: 5 units,  CBG 301 - 350: 7 units,  CBG 351 - 400: 9 units   CBG > 400: 9 units and notify your MD (Patient not taking: Reported on 03/06/2017) 03/06/2017: Pt states he is now on an insulin pump   . insulin detemir (LEVEMIR) 100 UNIT/ML injection Inject 24 Units into the skin daily. 03/06/2017: Pt states he is now on an insulin pump   . loratadine (CLARITIN) 10 MG tablet Take 10 mg by mouth daily. 03/06/2017: Pt reports he is now using mucinex 2 bid or q 12 hours   . [DISCONTINUED] acetaminophen (TYLENOL) 325 MG tablet Take 2 tablets (650 mg total) by mouth every  6 (six) hours as needed for mild pain or fever. (Patient not taking: Reported on 03/06/2017) 03/06/2017: now taking Aleve vs Tylenol  . [DISCONTINUED] bisacodyl (DULCOLAX) 10 MG suppository Place 1 suppository (10 mg total) rectally daily. (Patient not taking: Reported on 03/06/2017)   . [DISCONTINUED] diltiazem (CARDIZEM CD) 120 MG 24 hr capsule Take 1 capsule (120 mg total) by mouth daily. (Patient not taking: Reported on 03/06/2017) 03/06/2017: Stopped by Dr Nadyne Coombes in May 2018   . [DISCONTINUED] metoprolol tartrate (LOPRESSOR) 25 MG tablet Take 0.5 tablets (12.5 mg total) by mouth 2 (two) times daily. (Patient not taking: Reported on 02/20/2017) 02/08/2017: Patient reports stopped by dr Einar Gip.    . [DISCONTINUED] terazosin (HYTRIN) 1 MG capsule Take 1 capsule (1 mg total) by mouth at bedtime. (Patient not taking: Reported on 03/06/2017) 03/06/2017: Now on saw palmetto   No facility-administered  encounter medications on file as of 03/06/2017.     Functional Status:   In your present state of health, do you have any difficulty performing the following activities: 01/09/2017 12/27/2016  Hearing? N -  Vision? - Y  Difficulty concentrating or making decisions? N -  Walking or climbing stairs? Y Y  Dressing or bathing? Y -  Doing errands, shopping? Y Y  Some recent data might be hidden    Fall/Depression Screening:    Fall Risk  03/06/2017 12/21/2016  Falls in the past year? No Yes  Number falls in past yr: - 2 or more  Injury with Fall? - No  Risk Factor Category  - High Fall Risk  Risk for fall due to : History of fall(s) Impaired balance/gait;Impaired mobility  Follow up - Falls evaluation completed;Education provided;Falls prevention discussed   PHQ 2/9 Scores 03/06/2017 01/09/2017  PHQ - 2 Score 4 4  PHQ- 9 Score 9 9    Assessment:    HIPAA verified Presents today sitting in his lift recliner  [facility] CM attempted to reach him x 2 on his mobile and home numbers without answer.  [facility] CM left voice messages but no return call from Rick Rick Cruz who was at home when [facility] CM arrived to his home He discussed with [facility] CM that he was able to make it to his grand daughter's graduation and dinner recently   Acute Medical Conditions: Saw Dr [name] last on 02/27/17 who is aware per pt of headache, leg pain and neck pain and is assisting with treatment plan He went out to eat lunch and to see his podiatrist on 03/05/17  Chronic Medical Conditions: (COPD, DM, CHF, CAD PMH stroke, sleep apnea, schizophrenia)  Use of insulin pump which he is noted to be setting up when [facility] CM arrived for home visit  Medications/DME Medications updated in EPIC Hypertension medications discontinued Left insulin  Since he is using his insulin pump  Pt interested in obtaining custom zip up compression hose Went to [facility] (recommended by Dr Nadyne Coombes) but confirmed zip up compression hose are not covered  under [employer] (HTA) coverage  Rick Terhune states he will try [facility] for assistance with zipped up compression hose versus standard compression hose.  Wife and Rick Lambertson state they are not able to get standard ted hose on him.  [facility] CM discussed out of pocket expense if they are not covered on HTA plan He voiced understanding   Future medical appointments: 03/18/17 Scheduled to see Dr Luan Pulling 04/04/17 Endocrinology Dr Dorris Fetch   Plan: follow up 1- 2 weeks for home  visit [facility] to route note to pcp and other medical providers listed in [facility]  03/06/17 1621 left message for Leticia Clas, [facility] provider at [phone] About Rick Jeanpaul concern with not being able to see once he got his last prescription glasses and changes in his present vision.  Left pt home number for a return call The office is closed for the day at 12 noon   [facility] CM Care Plan Problem One     Most Recent Value  Care Plan Problem One  (P)  Knowledge of chronic illness (Chronic respiratory failure, DM2, CAD, CHF, PAF) and oxygen/DME management needs/availabilty   Role Documenting the Problem One  (P) Care Management Coordinator  Care Plan for Problem One  (P) Active  [facility] Long Term Goal   (P) Over the next 31 days the patient and family will verbalize knowledge of home care maintanence of chronic medical illnesses and DME needed for home care  [facility] Long Term Goal Start Date  (P) 02/06/17  Interventions for Problem One Long Term Goal  (P) re established goal 02/06/17, review of zone symptoms to report to providers, discussed standard values for bp, heart rate, weight gain, etc  [facility] CM Short Term Goal #1   (P) Over the next 30 days the patient and family will verbalize understanding of zone symptoms to report to providers for chronic illnesses (respiratory failure, CAD, DM, PAF, CHF  [facility] CM Short Term Goal #1 Start Date  (P) 02/06/17  Interventions for Short Term Goal #1  (P) Restablished goal 02/06/17,  Review zone symptoms, and normal values for vital signs, weight, provided educational packages,  [facility] CM Short Term Goal #2   (P) over the next 14-21 days the patient will have DME and possible medical providers needed for management of chronic respiratory illness  [facility] CM Short Term Goal #2 Start Date  (P) 12/27/16  [facility] CM Short Term Goal #2 Met Date  (P) 01/09/17  Interventions for Short Term Goal #2  (P) contact DME agencies ([facility], [facility] and other recommended Oxygen DME companies recommended by pulmonologist, contact pcp, request pulmonology consult, assist with coordination of DME         Alexza Norbeck L. Lavina Hamman, RN, BSN, CCM [facility] Care Management [phone]   Discussed calling his customer service number

## 2017-03-18 DIAGNOSIS — I509 Heart failure, unspecified: Secondary | ICD-10-CM | POA: Diagnosis not present

## 2017-03-18 DIAGNOSIS — E119 Type 2 diabetes mellitus without complications: Secondary | ICD-10-CM | POA: Diagnosis not present

## 2017-03-18 DIAGNOSIS — J9611 Chronic respiratory failure with hypoxia: Secondary | ICD-10-CM | POA: Diagnosis not present

## 2017-03-18 DIAGNOSIS — J449 Chronic obstructive pulmonary disease, unspecified: Secondary | ICD-10-CM | POA: Diagnosis not present

## 2017-03-19 ENCOUNTER — Other Ambulatory Visit: Payer: Self-pay | Admitting: *Deleted

## 2017-03-19 NOTE — Patient Outreach (Signed)
[facility]) Care Management  03/19/2017  ANURAG SCARFO [birth year] [id number]   Telephonic assessment attempts  Mr Bonnin and [facility] CM scheduled a 1 pm 03/19/17 telephone assessment [facility] CM called the home number [phone] and left a voice message [facility] CM called his mobile number [phone] but an automated message states the voice mail box has not been set up. [facility] CM unable to leave a message  [facility] CM left a message for  Mr Lynk on his wife's mobile [phone] to include [facility] CM mobile number for a return call Explained that [facility] CM had attempt to contact Mr Strike on home and mobile numbers without success.    Plan [facility] CM will make further call attempts to reach Mr Lambertson to complete telephone assessment    Joelene Millin L. Lavina Hamman, RN, BSN, [place] Care Management [phone]

## 2017-03-27 DIAGNOSIS — G4733 Obstructive sleep apnea (adult) (pediatric): Secondary | ICD-10-CM | POA: Diagnosis not present

## 2017-03-27 DIAGNOSIS — J449 Chronic obstructive pulmonary disease, unspecified: Secondary | ICD-10-CM | POA: Diagnosis not present

## 2017-03-27 DIAGNOSIS — S82843A Displaced bimalleolar fracture of unspecified lower leg, initial encounter for closed fracture: Secondary | ICD-10-CM | POA: Diagnosis not present

## 2017-04-04 ENCOUNTER — Encounter: Payer: Self-pay | Admitting: "Endocrinology

## 2017-04-04 ENCOUNTER — Ambulatory Visit: Payer: PPO | Admitting: "Endocrinology

## 2017-04-04 DIAGNOSIS — J449 Chronic obstructive pulmonary disease, unspecified: Secondary | ICD-10-CM | POA: Diagnosis not present

## 2017-04-04 DIAGNOSIS — J9620 Acute and chronic respiratory failure, unspecified whether with hypoxia or hypercapnia: Secondary | ICD-10-CM | POA: Diagnosis not present

## 2017-04-15 DIAGNOSIS — H35443 Age-related reticular degeneration of retina, bilateral: Secondary | ICD-10-CM | POA: Diagnosis not present

## 2017-04-15 DIAGNOSIS — H35033 Hypertensive retinopathy, bilateral: Secondary | ICD-10-CM | POA: Diagnosis not present

## 2017-04-15 DIAGNOSIS — E119 Type 2 diabetes mellitus without complications: Secondary | ICD-10-CM | POA: Diagnosis not present

## 2017-04-15 DIAGNOSIS — H43393 Other vitreous opacities, bilateral: Secondary | ICD-10-CM | POA: Diagnosis not present

## 2017-04-15 DIAGNOSIS — H5700 Unspecified anomaly of pupillary function: Secondary | ICD-10-CM | POA: Diagnosis not present

## 2017-04-15 DIAGNOSIS — Z9842 Cataract extraction status, left eye: Secondary | ICD-10-CM | POA: Diagnosis not present

## 2017-04-15 DIAGNOSIS — H34231 Retinal artery branch occlusion, right eye: Secondary | ICD-10-CM | POA: Diagnosis not present

## 2017-04-15 DIAGNOSIS — H353121 Nonexudative age-related macular degeneration, left eye, early dry stage: Secondary | ICD-10-CM | POA: Diagnosis not present

## 2017-04-15 DIAGNOSIS — H26491 Other secondary cataract, right eye: Secondary | ICD-10-CM | POA: Diagnosis not present

## 2017-04-15 DIAGNOSIS — I10 Essential (primary) hypertension: Secondary | ICD-10-CM | POA: Diagnosis not present

## 2017-04-15 DIAGNOSIS — Z961 Presence of intraocular lens: Secondary | ICD-10-CM | POA: Diagnosis not present

## 2017-04-15 DIAGNOSIS — H3589 Other specified retinal disorders: Secondary | ICD-10-CM | POA: Diagnosis not present

## 2017-04-22 DIAGNOSIS — J9611 Chronic respiratory failure with hypoxia: Secondary | ICD-10-CM | POA: Diagnosis not present

## 2017-04-22 DIAGNOSIS — G4733 Obstructive sleep apnea (adult) (pediatric): Secondary | ICD-10-CM | POA: Diagnosis not present

## 2017-04-22 DIAGNOSIS — I509 Heart failure, unspecified: Secondary | ICD-10-CM | POA: Diagnosis not present

## 2017-04-22 DIAGNOSIS — J449 Chronic obstructive pulmonary disease, unspecified: Secondary | ICD-10-CM | POA: Diagnosis not present

## 2017-04-23 ENCOUNTER — Telehealth: Payer: Self-pay | Admitting: *Deleted

## 2017-04-23 NOTE — Patient Outreach (Signed)
[facility] ([facility]) Care Management  04/23/2017  Daysean G Jacquot 09/27/1945 [id number]   After several calls and messages left for Mr Dillen, several missed appointments at doctor offices, [facility] CM sent an unsuccessful to reach letter to Mr Endo Mr Swango has met most of his [facility] CM care plan goals and was working on goal of identifying level of care symptoms to report to medical providers when he last responded and was seen by [facility] CM  Plans: Unsuccessful reach letter sent and case closure if no continued response from Mr Banik   L. , RN, BSN, CCM [facility] Care Management [phone]   

## 2017-04-25 DIAGNOSIS — E119 Type 2 diabetes mellitus without complications: Secondary | ICD-10-CM | POA: Diagnosis not present

## 2017-04-25 DIAGNOSIS — H00014 Hordeolum externum left upper eyelid: Secondary | ICD-10-CM | POA: Diagnosis not present

## 2017-04-25 DIAGNOSIS — E118 Type 2 diabetes mellitus with unspecified complications: Secondary | ICD-10-CM | POA: Diagnosis not present

## 2017-04-27 DIAGNOSIS — J449 Chronic obstructive pulmonary disease, unspecified: Secondary | ICD-10-CM | POA: Diagnosis not present

## 2017-04-27 DIAGNOSIS — S82843A Displaced bimalleolar fracture of unspecified lower leg, initial encounter for closed fracture: Secondary | ICD-10-CM | POA: Diagnosis not present

## 2017-04-27 DIAGNOSIS — G4733 Obstructive sleep apnea (adult) (pediatric): Secondary | ICD-10-CM | POA: Diagnosis not present

## 2017-04-30 ENCOUNTER — Other Ambulatory Visit: Payer: Self-pay | Admitting: *Deleted

## 2017-04-30 NOTE — Patient Outreach (Signed)
[facility]) Care Management  04/30/2017  SHLOK RAZ 30-Apr-1946 [id number]   Case closure  No return call from Mr [place] after documented calls and unsuccessful letter sent on 04/23/17   Plans Sending Mr Nakama, Dr [name] (pcp) closure letters for closure reason: Unable to contact  Updated [facility] CMA   [facility] CM Care Plan Problem One     Most Recent Value  Care Plan Problem One   Knowledge of chronic illness (Chronic respiratory failure, DM2, CAD, CHF, PAF) and oxygen/DME management needs/availabilty   Role Documenting the Problem One  Care Management Coordinator  Care Plan for Problem One  Active  [facility] Long Term Goal   Over the next 31 days the patient and family will verbalize knowledge of home care maintanence of chronic medical illnesses and DME needed for home care  [facility] Long Term Goal Start Date  02/06/17  [facility] Long Term Goal Met Date  03/06/17  Interventions for Problem One Long Term Goal  re established goal 02/06/17, review of zone symptoms to report to providers, discussed standard values for bp, heart rate, weight gain, etc  [facility] CM Short Term Goal #1   Over the next 30 days the patient and family will verbalize understanding of zone symptoms to report to providers for chronic illnesses (respiratory failure, CAD, DM, PAF, CHF  [facility] CM Short Term Goal #1 Start Date  02/06/17  [facility] CM Short Term Goal #1 Met Date  04/30/17  Interventions for Short Term Goal #1  Restablished goal 02/06/17, Review zone symptoms, and normal values for vital signs, weight, provided educational packages,  [facility] CM Short Term Goal #2   over the next 14-21 days the patient will have DME and possible medical providers needed for management of chronic respiratory illness  [facility] CM Short Term Goal #2 Start Date  12/27/16  [facility] CM Short Term Goal #2 Met Date  01/09/17  Interventions for Short Term Goal #2  contact DME agencies ([facility], [facility] and other recommended Oxygen DME  companies recommended by pulmonologist, contact pcp, request pulmonology consult, assist with coordination of DME      Routed this note to pcp, and other listed EPIC care team members  Moore L. Lavina Hamman, RN, BSN, [place] Care Management [phone]

## 2017-05-09 DIAGNOSIS — J9622 Acute and chronic respiratory failure with hypercapnia: Secondary | ICD-10-CM | POA: Diagnosis not present

## 2017-05-09 DIAGNOSIS — J449 Chronic obstructive pulmonary disease, unspecified: Secondary | ICD-10-CM | POA: Diagnosis not present

## 2017-05-09 DIAGNOSIS — J9611 Chronic respiratory failure with hypoxia: Secondary | ICD-10-CM | POA: Diagnosis not present

## 2017-05-27 DIAGNOSIS — E118 Type 2 diabetes mellitus with unspecified complications: Secondary | ICD-10-CM | POA: Diagnosis not present

## 2017-05-27 DIAGNOSIS — E119 Type 2 diabetes mellitus without complications: Secondary | ICD-10-CM | POA: Diagnosis not present

## 2017-05-28 DIAGNOSIS — G4733 Obstructive sleep apnea (adult) (pediatric): Secondary | ICD-10-CM | POA: Diagnosis not present

## 2017-05-28 DIAGNOSIS — J449 Chronic obstructive pulmonary disease, unspecified: Secondary | ICD-10-CM | POA: Diagnosis not present

## 2017-05-28 DIAGNOSIS — S82843A Displaced bimalleolar fracture of unspecified lower leg, initial encounter for closed fracture: Secondary | ICD-10-CM | POA: Diagnosis not present

## 2017-05-29 DIAGNOSIS — J9611 Chronic respiratory failure with hypoxia: Secondary | ICD-10-CM | POA: Diagnosis not present

## 2017-05-29 DIAGNOSIS — J449 Chronic obstructive pulmonary disease, unspecified: Secondary | ICD-10-CM | POA: Diagnosis not present

## 2017-05-29 DIAGNOSIS — I509 Heart failure, unspecified: Secondary | ICD-10-CM | POA: Diagnosis not present

## 2017-05-29 DIAGNOSIS — G4733 Obstructive sleep apnea (adult) (pediatric): Secondary | ICD-10-CM | POA: Diagnosis not present

## 2017-06-09 DIAGNOSIS — J449 Chronic obstructive pulmonary disease, unspecified: Secondary | ICD-10-CM | POA: Diagnosis not present

## 2017-06-09 DIAGNOSIS — J9622 Acute and chronic respiratory failure with hypercapnia: Secondary | ICD-10-CM | POA: Diagnosis not present

## 2017-06-09 DIAGNOSIS — J9611 Chronic respiratory failure with hypoxia: Secondary | ICD-10-CM | POA: Diagnosis not present

## 2017-06-10 DIAGNOSIS — E119 Type 2 diabetes mellitus without complications: Secondary | ICD-10-CM | POA: Diagnosis not present

## 2017-06-12 DIAGNOSIS — J449 Chronic obstructive pulmonary disease, unspecified: Secondary | ICD-10-CM | POA: Diagnosis not present

## 2017-06-12 DIAGNOSIS — G4733 Obstructive sleep apnea (adult) (pediatric): Secondary | ICD-10-CM | POA: Diagnosis not present

## 2017-06-12 DIAGNOSIS — F419 Anxiety disorder, unspecified: Secondary | ICD-10-CM | POA: Diagnosis not present

## 2017-06-12 DIAGNOSIS — E7800 Pure hypercholesterolemia, unspecified: Secondary | ICD-10-CM | POA: Diagnosis not present

## 2017-06-12 DIAGNOSIS — R0609 Other forms of dyspnea: Secondary | ICD-10-CM | POA: Diagnosis not present

## 2017-06-12 DIAGNOSIS — R21 Rash and other nonspecific skin eruption: Secondary | ICD-10-CM | POA: Diagnosis not present

## 2017-06-12 DIAGNOSIS — Z125 Encounter for screening for malignant neoplasm of prostate: Secondary | ICD-10-CM | POA: Diagnosis not present

## 2017-06-12 DIAGNOSIS — E118 Type 2 diabetes mellitus with unspecified complications: Secondary | ICD-10-CM | POA: Diagnosis not present

## 2017-06-17 ENCOUNTER — Emergency Department (HOSPITAL_COMMUNITY)
Admission: EM | Admit: 2017-06-17 | Discharge: 2017-06-17 | Disposition: A | Discharge status: Home / Self Care | Payer: PPO | Attending: Emergency Medicine | Admitting: Emergency Medicine

## 2017-06-17 ENCOUNTER — Encounter (HOSPITAL_COMMUNITY): Payer: Self-pay | Admitting: Emergency Medicine

## 2017-06-17 DIAGNOSIS — Z794 Long term (current) use of insulin: Secondary | ICD-10-CM | POA: Diagnosis not present

## 2017-06-17 DIAGNOSIS — Z87891 Personal history of nicotine dependence: Secondary | ICD-10-CM | POA: Insufficient documentation

## 2017-06-17 DIAGNOSIS — I509 Heart failure, unspecified: Secondary | ICD-10-CM | POA: Diagnosis not present

## 2017-06-17 DIAGNOSIS — I110 Hypertensive heart disease with heart failure: Secondary | ICD-10-CM | POA: Insufficient documentation

## 2017-06-17 DIAGNOSIS — E119 Type 2 diabetes mellitus without complications: Secondary | ICD-10-CM | POA: Diagnosis not present

## 2017-06-17 DIAGNOSIS — F419 Anxiety disorder, unspecified: Secondary | ICD-10-CM | POA: Insufficient documentation

## 2017-06-17 DIAGNOSIS — Z79899 Other long term (current) drug therapy: Secondary | ICD-10-CM | POA: Diagnosis not present

## 2017-06-17 DIAGNOSIS — I2510 Atherosclerotic heart disease of native coronary artery without angina pectoris: Secondary | ICD-10-CM | POA: Insufficient documentation

## 2017-06-17 DIAGNOSIS — J449 Chronic obstructive pulmonary disease, unspecified: Secondary | ICD-10-CM | POA: Diagnosis not present

## 2017-06-17 DIAGNOSIS — R51 Headache: Secondary | ICD-10-CM | POA: Diagnosis present

## 2017-06-17 HISTORY — DX: Essential (primary) hypertension: I10

## 2017-06-17 LAB — CBG MONITORING, ED: GLUCOSE-CAPILLARY: 122 mg/dL — AB (ref 65–99)

## 2017-06-17 MED ORDER — LORAZEPAM 2 MG/ML IJ SOLN
0.5000 mg | Freq: Once | INTRAMUSCULAR | Status: AC
  Administered 2017-06-17: 0.5 mg via INTRAVENOUS
  Filled 2017-06-17: qty 1

## 2017-06-17 MED ORDER — LORAZEPAM 1 MG PO TABS: 0.5000 mg | ORAL_TABLET | Freq: Four times a day (QID) | ORAL | 0 refills | Status: DC | PRN

## 2017-06-17 NOTE — ED Notes (Signed)
Patient refused to sign discharge.

## 2017-06-17 NOTE — ED Triage Notes (Signed)
Pt from home. C/o htn for over a month and seeing pcp for that. Pt states ha x 2 days and "thrush" nmouth x 2 days. Nad.

## 2017-06-17 NOTE — ED Notes (Signed)
Patient removed IV and monitor stating that he was not staying anymore.

## 2017-06-17 NOTE — ED Provider Notes (Signed)
[facility] DEPT Provider Note   CSN: [id number] Arrival date & time: 06/17/17  0915     History   Chief Complaint Chief Complaint  Patient presents with  . Hypertension  . Dental Pain    mouth. not teeth    HPI Rick Cruz is a 71 y.o. male.   Hypertension  This is a recurrent problem. The current episode started 1 to 2 hours ago. The problem occurs rarely. The problem has been resolved. Associated symptoms include headaches (with anxiety). Pertinent negatives include no chest pain and no shortness of breath. Nothing aggravates the symptoms. Nothing relieves the symptoms. He has tried nothing for the symptoms.  Dental Pain      Past Medical History:  Diagnosis Date  . Arthritis   . 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 CHF (congestive heart failure) (HCC)    diastolic   . Chronic pain   . COPD (chronic obstructive pulmonary disease) ([place])   . Depression   . Diabetes mellitus without complication (HCC)    insulin pump  . Hypercholesterolemia   . Hypertension   . On home O2    3 liters  . Oxygen deficiency   . Schizophrenia ([place])   . Sleep apnea    noncompliant with BiPAP  . Sleep apnea   . Tobacco abuse     Patient Active Problem List   Diagnosis Date Noted  . OSA (obstructive sleep apnea)   . Chronic respiratory failure with hypercapnia ([place])   . Undifferentiated schizophrenia ([place])   . Uncontrolled type 2 diabetes mellitus with complication ([place])   . Anemia of chronic disease   . Paroxysmal atrial fibrillation (HCC)   . Hyponatremia 06/12/2016  . Aspiration pneumonia ([place]) 08/16/2015  . Obesity hypoventilation syndrome ([place]) 04/05/2015  . CSA (central sleep apnea) 04/05/2015  . Non compliance with medical treatment 04/05/2015  . Acute respiratory failure with hypoxia and hypercarbia (HCC)   . Chronic diastolic CHF (congestive heart failure) ([place]) 08/06/2014  . Recurrent falls 05/08/2014  . Ankle fracture, right 05/08/2014  . Ankle  fracture, left 05/08/2014  . C. difficile colitis 01/04/2014  . Delirium 01/02/2014  . Acute encephalopathy 01/02/2014  . Morbid obesity ([place]) 06/17/2013  . Erosive esophagitis 04/22/2013  . Chronic respiratory failure 02 dep with hypercarbia 04/22/2013  . Type 2 diabetes mellitus without complication, with long-term current use of insulin ([place]) 04/21/2013  . Paranoid schizophrenia ([place]) 04/18/2013  . CAD (coronary artery disease) 04/18/2013  . DYSLIPIDEMIA 08/25/2008  . Obstructive sleep apnea 08/25/2008  . Essential hypertension 08/25/2008    Past Surgical History:  Procedure Laterality Date  . COLON SURGERY  March 2010   secondary to large colon polyp, final path per discharge summary notes tubulovillous adenoma.   . COLONOSCOPY  July 2011   Dr. Benson Norway: multiple polyps, internal and external hemorrhoids, diverticulosis. tubular adenoma  . ESOPHAGOGASTRODUODENOSCOPY (EGD) WITH PROPOFOL N/A 04/20/2013   Procedure: ESOPHAGOGASTRODUODENOSCOPY (EGD) WITH PROPOFOL;  Surgeon: Daneil Dolin, MD;  Location: [facility];  Service: Endoscopy;  Laterality: N/A;  . KNEE SURGERY     X2       Home Medications    Prior to Admission medications   Medication Sig Start Date End Date Taking? Authorizing Provider  citalopram (CELEXA) 40 MG tablet Take 40 mg by mouth daily.   Yes [provider]  insulin aspart (NOVOLOG) 100 UNIT/ML injection Inject 0-9 Units into the skin 3 (three) times daily with meals. Sliding scale  CBG 70 - 120: 0 units CBG 121 - 150: 1 unit,  CBG 151 - 200: 2 units,  CBG 201 - 250: 3 units,  CBG 251 - 300: 5 units,  CBG 301 - 350: 7 units,  CBG 351 - 400: 9 units   CBG > 400: 9 units and notify your MD 06/15/16  Yes Rai, Ripudeep K, MD  albuterol (PROVENTIL HFA;VENTOLIN HFA) 108 (90 BASE) MCG/ACT inhaler Inhale 2 puffs into the lungs every 6 (six) hours as needed for wheezing or shortness of breath. 05/11/14   Black, Lezlie Octave, NP  albuterol (PROVENTIL) (2.5 MG/3ML) 0.083%  nebulizer solution Take 3 mLs (2.5 mg total) by nebulization every 6 (six) hours as needed for wheezing or shortness of breath. 08/21/15   Kathie Dike, MD  apixaban (ELIQUIS) 5 MG TABS tablet Take 1 tablet (5 mg total) by mouth 2 (two) times daily. 06/15/16   Rai, Ripudeep Raliegh Ip, MD  cholecalciferol (VITAMIN D) 1000 UNITS tablet Take 2,000 Units by mouth daily.    [provider]  Cinnamon 500 MG capsule Take 1,000 mg by mouth every morning.     [provider]  clotrimazole-betamethasone (LOTRISONE) cream Apply 1 application topically 2 (two) times daily. 06/12/17   [provider]  docusate sodium (COLACE) 100 MG capsule Take 100 mg by mouth 2 (two) times daily as needed.     [provider]  furosemide (LASIX) 20 MG tablet Take 3 tablets (60 mg total) by mouth 2 (two) times daily. 06/15/16   Rai, Vernelle Emerald, MD  gabapentin (NEURONTIN) 300 MG capsule Take 1 capsule (300 mg total) by mouth 2 (two) times daily. 05/28/16   Bonnielee Haff, MD  hydrOXYzine (ATARAX/VISTARIL) 25 MG tablet Take 1 tablet by mouth 2 (two) times daily as needed for allergies. 06/12/17   [provider]  insulin aspart (NOVOLOG) 100 UNIT/ML injection Inject 8 Units into the skin 3 (three) times daily with meals. Patient not taking: Reported on 03/06/2017 06/15/16   Rai, Vernelle Emerald, MD  insulin detemir (LEVEMIR) 100 UNIT/ML injection Inject 24 Units into the skin daily.    [provider]  loperamide (IMODIUM) 2 MG capsule Take 1 capsule (2 mg total) by mouth 3 (three) times daily as needed for diarrhea or loose stools. 05/29/16   Bonnielee Haff, MD  loratadine (CLARITIN) 10 MG tablet Take 10 mg by mouth daily.    [provider]  LORazepam (ATIVAN) 1 MG tablet Take 0.5 tablets (0.5 mg total) by mouth every 6 (six) hours as needed for anxiety. 06/17/17   Mortimer Bair, Corene Cornea, MD  Naproxen Sodium (ALEVE PO) Take 1 tablet by mouth 2 (two) times daily.     [provider]    pantoprazole (PROTONIX) 40 MG tablet Take 40 mg by mouth 2 (two) times daily.    [provider]  saw palmetto 160 MG capsule Take 160 mg by mouth 2 (two) times daily.    Vassie Moment, NP  simvastatin (ZOCOR) 40 MG tablet Take 40 mg by mouth every evening.    [provider]  traZODone (DESYREL) 100 MG tablet Take 100 mg by mouth at bedtime.     [provider]    Family History Family History  Problem Relation Age of Onset  . Thyroid disease Mother   . Parkinson's disease Father   . Parkinson's disease Unknown   . Colon cancer Neg Hx     Social History Social History  Substance Use Topics  .  Smoking status: Former Smoker    Packs/day: 1.00    Years: 50.00    Types: Cigarettes    Quit date: 09/16/2013  . Smokeless tobacco: Never Used  . Alcohol use No     Comment: history of ETOH abuse in remote past, none in at least 10 years     Allergies   Phenergan [promethazine hcl]; Haldol [haloperidol lactate]; and Metformin and related   Review of Systems Review of Systems  HENT:       Diffuse mouth pain after wearing dentures  Respiratory: Negative for shortness of breath.   Cardiovascular: Negative for chest pain.  Neurological: Positive for headaches (with anxiety).  All other systems reviewed and are negative.    Physical Exam Updated Vital Signs BP 128/84   Pulse 82   Temp 98.5 F (36.9 C) (Oral)   Resp 18   Ht 6' (1.829 m)   Wt 132 kg (291 lb)   SpO2 99%   BMI 39.47 kg/m   Physical Exam  Constitutional: He is oriented to person, place, and time. He appears well-developed and well-nourished.  HENT:  Head: Normocephalic and atraumatic.  Eyes: Conjunctivae and EOM are normal.  Neck: Normal range of motion.  Cardiovascular: Normal rate.   Pulmonary/Chest: Effort normal. No respiratory distress. He has no rales.  Abdominal: Soft. He exhibits no distension. There is no tenderness.  Musculoskeletal: Normal range of motion.   Neurological: He is alert and oriented to person, place, and time. No cranial nerve deficit (aside from decreased vision and EOM in right eye from previous stroke). He exhibits normal muscle tone.  Skin: Skin is warm and dry.  Psychiatric: His mood appears anxious.  Nursing note and vitals reviewed.    ED Treatments / Results  Labs (all labs ordered are listed, but only abnormal results are displayed) Labs Reviewed  CBG MONITORING, ED - Abnormal; Notable for the following:       Result Value   Glucose-Capillary 122 (*)    All other components within normal limits    EKG  EKG Interpretation  Date/Time:  Monday June 17 2017 09:40:12 EDT Ventricular Rate:  95 PR Interval:    QRS Duration: 126 QT Interval:  402 QTC Calculation: 492 R Axis:   57 Text Interpretation:  Sinus rhythm Atrial premature complexes Right bundle branch block Baseline wander in lead(s) V2 V3 no obvious problems with baseline wander Confirmed by Merrily Pew [phone]) on 06/17/2017 10:12:56 AM       Radiology No results found.  Procedures Procedures (including critical care time)  Medications Ordered in ED Medications  LORazepam (ATIVAN) injection 0.5 mg (0.5 mg Intravenous Given 06/17/17 1047)     Initial Impression / Assessment and Plan / ED Course  I have reviewed the triage vital signs and the nursing notes.  Pertinent labs & imaging results that were available during my care of the patient were reviewed by me and considered in my medical decision making (see chart for details).     Patient here with hypertension likely related to his anxiety. 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 Repeat blood pressures here were normal. Would not recommend blood pressure management this time.  His mouth he gets a tingling feeling in it once while seems to be associated with wearing his dentures as per his wife's recollection. He likely needs to see a  dentist to look into that further. Doubt cva or neurologic issue at this time  He is followed his primary doctor for his anxiety we'll give him a small prescription for Ativan at this time as he is apparently consistently worried which also makes his BP go up some as well. Seems to have headaches surrounding the anxiety and the BP, doubt HTN emergency at this time or stroke. His wife will help administered as patient has a history of addiction.  Final Clinical Impressions(s) / ED Diagnoses   Final diagnoses:  Anxiety    New Prescriptions Discharge Medication List as of 06/17/2017 12:28 PM    START taking these medications   Details  LORazepam (ATIVAN) 1 MG tablet Take 0.5 tablets (0.5 mg total) by mouth every 6 (six) hours as needed for anxiety., Starting Mon 06/17/2017, Print         Keaunna Skipper, Corene Cornea, MD 06/17/17 [phone]

## 2017-06-21 ENCOUNTER — Other Ambulatory Visit (HOSPITAL_BASED_OUTPATIENT_CLINIC_OR_DEPARTMENT_OTHER): Payer: Self-pay

## 2017-06-21 DIAGNOSIS — G4733 Obstructive sleep apnea (adult) (pediatric): Secondary | ICD-10-CM

## 2017-06-26 DIAGNOSIS — G4733 Obstructive sleep apnea (adult) (pediatric): Secondary | ICD-10-CM | POA: Diagnosis not present

## 2017-07-09 DIAGNOSIS — J449 Chronic obstructive pulmonary disease, unspecified: Secondary | ICD-10-CM | POA: Diagnosis not present

## 2017-07-09 DIAGNOSIS — J9611 Chronic respiratory failure with hypoxia: Secondary | ICD-10-CM | POA: Diagnosis not present

## 2017-07-09 DIAGNOSIS — J9622 Acute and chronic respiratory failure with hypercapnia: Secondary | ICD-10-CM | POA: Diagnosis not present

## 2017-07-17 DIAGNOSIS — Z23 Encounter for immunization: Secondary | ICD-10-CM | POA: Diagnosis not present

## 2017-07-17 DIAGNOSIS — G4733 Obstructive sleep apnea (adult) (pediatric): Secondary | ICD-10-CM | POA: Diagnosis not present

## 2017-07-17 DIAGNOSIS — I482 Chronic atrial fibrillation: Secondary | ICD-10-CM | POA: Diagnosis not present

## 2017-07-17 DIAGNOSIS — J9611 Chronic respiratory failure with hypoxia: Secondary | ICD-10-CM | POA: Diagnosis not present

## 2017-07-17 DIAGNOSIS — J449 Chronic obstructive pulmonary disease, unspecified: Secondary | ICD-10-CM | POA: Diagnosis not present

## 2017-07-29 ENCOUNTER — Other Ambulatory Visit (HOSPITAL_BASED_OUTPATIENT_CLINIC_OR_DEPARTMENT_OTHER): Payer: Self-pay

## 2017-07-29 DIAGNOSIS — G4733 Obstructive sleep apnea (adult) (pediatric): Secondary | ICD-10-CM

## 2017-07-30 DIAGNOSIS — G4700 Insomnia, unspecified: Secondary | ICD-10-CM | POA: Insufficient documentation

## 2017-08-03 NOTE — Procedures (Unsigned)
Rick A. Merlene Laughter, MD     [email or web address]             HOME SLEEP TEST  LOCATION: [facility]  INDICATION This is a 71 year old man who presents with symptoms consistent with obstructive sleep apnea.  He has fatigue and snoring.  MEDICATION  Current Outpatient Medications:  .  albuterol (PROVENTIL HFA;VENTOLIN HFA) 108 (90 BASE) MCG/ACT inhaler, Inhale 2 puffs into the lungs every 6 (six) hours as needed for wheezing or shortness of breath., Disp: 1 Inhaler, Rfl: 2 .  albuterol (PROVENTIL) (2.5 MG/3ML) 0.083% nebulizer solution, Take 3 mLs (2.5 mg total) by nebulization every 6 (six) hours as needed for wheezing or shortness of breath., Disp: 75 mL, Rfl: 0 .  apixaban (ELIQUIS) 5 MG TABS tablet, Take 1 tablet (5 mg total) by mouth 2 (two) times daily., Disp: 60 tablet, Rfl: 1 .  cholecalciferol (VITAMIN D) 1000 UNITS tablet, Take 2,000 Units by mouth daily., Disp: , Rfl:  .  Cinnamon 500 MG capsule, Take 1,000 mg by mouth every morning. , Disp: , Rfl:  .  citalopram (CELEXA) 40 MG tablet, Take 40 mg by mouth daily., Disp: , Rfl:  .  clotrimazole-betamethasone (LOTRISONE) cream, Apply 1 application topically 2 (two) times daily., Disp: , Rfl: 0 .  docusate sodium (COLACE) 100 MG capsule, Take 100 mg by mouth 2 (two) times daily as needed. , Disp: , Rfl:  .  furosemide (LASIX) 20 MG tablet, Take 3 tablets (60 mg total) by mouth 2 (two) times daily., Disp: 30 tablet, Rfl:  .  gabapentin (NEURONTIN) 300 MG capsule, Take 1 capsule (300 mg total) by mouth 2 (two) times daily., Disp: 60 capsule, Rfl: 0 .  hydrOXYzine (ATARAX/VISTARIL) 25 MG tablet, Take 1 tablet by mouth 2 (two) times daily as needed for allergies., Disp: , Rfl: 0 .  insulin aspart (NOVOLOG) 100 UNIT/ML injection, Inject 8 Units into the skin 3 (three) times daily with meals. (Patient not taking: Reported on 03/06/2017), Disp: 10 mL, Rfl: 11 .  insulin aspart (NOVOLOG) 100 UNIT/ML injection, Inject  0-9 Units into the skin 3 (three) times daily with meals. Sliding scale CBG 70 - 120: 0 units CBG 121 - 150: 1 unit,  CBG 151 - 200: 2 units,  CBG 201 - 250: 3 units,  CBG 251 - 300: 5 units,  CBG 301 - 350: 7 units,  CBG 351 - 400: 9 units   CBG > 400: 9 units and notify your MD, Disp: 10 mL, Rfl: 11 .  insulin detemir (LEVEMIR) 100 UNIT/ML injection, Inject 24 Units into the skin daily., Disp: , Rfl:  .  loperamide (IMODIUM) 2 MG capsule, Take 1 capsule (2 mg total) by mouth 3 (three) times daily as needed for diarrhea or loose stools., Disp: 30 capsule, Rfl: 0 .  loratadine (CLARITIN) 10 MG tablet, Take 10 mg by mouth daily., Disp: , Rfl:  .  LORazepam (ATIVAN) 1 MG tablet, Take 0.5 tablets (0.5 mg total) by mouth every 6 (six) hours as needed for anxiety., Disp: 10 tablet, Rfl: 0 .  Naproxen Sodium (ALEVE PO), Take 1 tablet by mouth 2 (two) times daily. , Disp: , Rfl:  .  pantoprazole (PROTONIX) 40 MG tablet, Take 40 mg by mouth 2 (two) times daily., Disp: , Rfl:  .  saw palmetto 160 MG capsule, Take 160 mg by mouth 2 (two) times daily., Disp: , Rfl:  .  simvastatin (ZOCOR) 40 MG tablet, Take 40 mg  by mouth every evening., Disp: , Rfl:  .  traZODone (DESYREL) 100 MG tablet, Take 100 mg by mouth at bedtime. , Disp: , Rfl:    STUDY The report of this study is delayed significantly because the patient did not return the equipment for about six weeks after the home study was done.  The total recording time is 538 min.  The lowest saturation is 74% and the AHI is 32.  IMPRESSION This study shows moderately severe obstructive sleep apnea syndrome.  A formal CPAP titration recording is recommended.  Rick Metz, MD Diplomate, American Board of Sleep Medicine. ELECTRONICALLY SIGNED ON:  08/03/2017, 4:29 PM [place] PH: [phone]   FX: [phone] [employer]

## 2017-08-06 DIAGNOSIS — L603 Nail dystrophy: Secondary | ICD-10-CM | POA: Diagnosis not present

## 2017-08-06 DIAGNOSIS — E1142 Type 2 diabetes mellitus with diabetic polyneuropathy: Secondary | ICD-10-CM | POA: Diagnosis not present

## 2017-08-09 DIAGNOSIS — J9622 Acute and chronic respiratory failure with hypercapnia: Secondary | ICD-10-CM | POA: Diagnosis not present

## 2017-08-09 DIAGNOSIS — J9611 Chronic respiratory failure with hypoxia: Secondary | ICD-10-CM | POA: Diagnosis not present

## 2017-08-09 DIAGNOSIS — J449 Chronic obstructive pulmonary disease, unspecified: Secondary | ICD-10-CM | POA: Diagnosis not present

## 2017-08-26 ENCOUNTER — Emergency Department (HOSPITAL_COMMUNITY)
Admission: EM | Admit: 2017-08-26 | Discharge: 2017-08-26 | Disposition: A | Discharge status: Home / Self Care | Payer: PPO | Attending: Emergency Medicine | Admitting: Emergency Medicine

## 2017-08-26 ENCOUNTER — Other Ambulatory Visit: Payer: Self-pay

## 2017-08-26 ENCOUNTER — Encounter (HOSPITAL_COMMUNITY): Payer: Self-pay | Admitting: *Deleted

## 2017-08-26 DIAGNOSIS — Z87891 Personal history of nicotine dependence: Secondary | ICD-10-CM | POA: Diagnosis not present

## 2017-08-26 DIAGNOSIS — R739 Hyperglycemia, unspecified: Secondary | ICD-10-CM | POA: Diagnosis not present

## 2017-08-26 DIAGNOSIS — I5030 Unspecified diastolic (congestive) heart failure: Secondary | ICD-10-CM | POA: Diagnosis not present

## 2017-08-26 DIAGNOSIS — I2510 Atherosclerotic heart disease of native coronary artery without angina pectoris: Secondary | ICD-10-CM | POA: Diagnosis not present

## 2017-08-26 DIAGNOSIS — Y828 Other medical devices associated with adverse incidents: Secondary | ICD-10-CM | POA: Diagnosis not present

## 2017-08-26 DIAGNOSIS — Z794 Long term (current) use of insulin: Secondary | ICD-10-CM | POA: Diagnosis not present

## 2017-08-26 DIAGNOSIS — I110 Hypertensive heart disease with heart failure: Secondary | ICD-10-CM | POA: Insufficient documentation

## 2017-08-26 DIAGNOSIS — E1165 Type 2 diabetes mellitus with hyperglycemia: Secondary | ICD-10-CM | POA: Diagnosis not present

## 2017-08-26 DIAGNOSIS — Z79899 Other long term (current) drug therapy: Secondary | ICD-10-CM | POA: Insufficient documentation

## 2017-08-26 DIAGNOSIS — J449 Chronic obstructive pulmonary disease, unspecified: Secondary | ICD-10-CM | POA: Diagnosis not present

## 2017-08-26 DIAGNOSIS — E1065 Type 1 diabetes mellitus with hyperglycemia: Secondary | ICD-10-CM | POA: Diagnosis not present

## 2017-08-26 DIAGNOSIS — T85614A Breakdown (mechanical) of insulin pump, initial encounter: Secondary | ICD-10-CM

## 2017-08-26 LAB — BASIC METABOLIC PANEL
ANION GAP: 7 (ref 5–15)
BUN: 14 mg/dL (ref 6–20)
CALCIUM: 9.1 mg/dL (ref 8.9–10.3)
CHLORIDE: 95 mmol/L — AB (ref 101–111)
CO2: 36 mmol/L — AB (ref 22–32)
Creatinine, Ser: 1.19 mg/dL (ref 0.61–1.24)
GFR calc non Af Amer: >60 mL/min (ref >60)
Glucose, Bld: 172 mg/dL — ABNORMAL HIGH (ref 65–99)
Potassium: 4.3 mmol/L (ref 3.5–5.1)
Sodium: 138 mmol/L (ref 135–145)

## 2017-08-26 LAB — CBG MONITORING, ED: Glucose-Capillary: 283 mg/dL — ABNORMAL HIGH (ref 65–99)

## 2017-08-26 MED ORDER — INSULIN GLARGINE 100 UNIT/ML ~~LOC~~ SOLN: 26.0000 [IU] | Freq: Every day | SUBCUTANEOUS | 0 refills | Status: DC

## 2017-08-26 MED ORDER — INSULIN ASPART 100 UNIT/ML FLEXPEN: 20.0000 [IU] | PEN_INJECTOR | Freq: Three times a day (TID) | SUBCUTANEOUS | 0 refills | Status: DC

## 2017-08-26 NOTE — Discharge Instructions (Signed)
Let Dr Julianne Rice office know your insulin pump isn't working, take the novolog and lantus insulin until you get your insulin pump fixed.  Recheck as needed.
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
## 2017-08-26 NOTE — ED Triage Notes (Signed)
Pt brought in by [facility] for c/o hyperglycemia; pt's initial cbg 470, pt given 580ml bolus and cbg down to 408; pt wears an insulin pump and the battery has died.

## 2017-09-02 DIAGNOSIS — H04123 Dry eye syndrome of bilateral lacrimal glands: Secondary | ICD-10-CM | POA: Diagnosis not present

## 2017-09-05 DIAGNOSIS — E119 Type 2 diabetes mellitus without complications: Secondary | ICD-10-CM | POA: Diagnosis not present

## 2017-09-05 DIAGNOSIS — E118 Type 2 diabetes mellitus with unspecified complications: Secondary | ICD-10-CM | POA: Diagnosis not present

## 2017-09-06 DIAGNOSIS — E118 Type 2 diabetes mellitus with unspecified complications: Secondary | ICD-10-CM | POA: Diagnosis not present

## 2017-09-06 DIAGNOSIS — E119 Type 2 diabetes mellitus without complications: Secondary | ICD-10-CM | POA: Diagnosis not present

## 2017-09-08 DIAGNOSIS — J9611 Chronic respiratory failure with hypoxia: Secondary | ICD-10-CM | POA: Diagnosis not present

## 2017-09-08 DIAGNOSIS — J449 Chronic obstructive pulmonary disease, unspecified: Secondary | ICD-10-CM | POA: Diagnosis not present

## 2017-09-08 DIAGNOSIS — J9622 Acute and chronic respiratory failure with hypercapnia: Secondary | ICD-10-CM | POA: Diagnosis not present

## 2017-09-18 DIAGNOSIS — I10 Essential (primary) hypertension: Secondary | ICD-10-CM | POA: Diagnosis not present

## 2017-09-18 DIAGNOSIS — K219 Gastro-esophageal reflux disease without esophagitis: Secondary | ICD-10-CM | POA: Diagnosis not present

## 2017-09-18 DIAGNOSIS — M542 Cervicalgia: Secondary | ICD-10-CM | POA: Diagnosis not present

## 2017-09-18 DIAGNOSIS — E118 Type 2 diabetes mellitus with unspecified complications: Secondary | ICD-10-CM | POA: Diagnosis not present

## 2017-09-18 DIAGNOSIS — E7800 Pure hypercholesterolemia, unspecified: Secondary | ICD-10-CM | POA: Diagnosis not present

## 2017-10-08 ENCOUNTER — Other Ambulatory Visit: Payer: Self-pay

## 2017-10-08 NOTE — Patient Outreach (Signed)
[facility]) Care Management  10/08/2017  Rick Cruz 1945/12/16 [id number]   Telephone Screen  Referral Date: 10/07/17 Referral Source: Nurse Call Center Report Referral Reason:"caller states that he has been having intermittent left sided chest pain since yesterday, states that he last had it 55mins ago and had some indigestion with it. Describes as a pressure that comes and goes." Caller advised by on call nurse to call EMS/911 now. Pt refused 911 but will have wife drive him to ED. Insurance: HTA   Outreach attempt # 1 to patient. No answer at present. RN CM left HIPAA compliant voicemail message along with contact info.     Plan: RN CM will make outreach attempt to patient within one business day if no return call.    [name], RN,BSN,CCM [place] Management Telephonic Care Management Coordinator Direct Phone: [phone] Toll Free: [phone] Fax: [phone]

## 2017-10-09 ENCOUNTER — Other Ambulatory Visit: Payer: Self-pay

## 2017-10-09 DIAGNOSIS — J449 Chronic obstructive pulmonary disease, unspecified: Secondary | ICD-10-CM | POA: Diagnosis not present

## 2017-10-09 DIAGNOSIS — J9622 Acute and chronic respiratory failure with hypercapnia: Secondary | ICD-10-CM | POA: Diagnosis not present

## 2017-10-09 DIAGNOSIS — J9611 Chronic respiratory failure with hypoxia: Secondary | ICD-10-CM | POA: Diagnosis not present

## 2017-10-09 NOTE — Patient Outreach (Signed)
[facility]) Care Management  10/09/2017  Rick Cruz [birth year] [id number]   Telephone Screen  Referral Date: 10/07/17 Referral Source: Nurse Call Center Report Referral Reason:"caller states that he has been having intermittent left sided chest pain since yesterday, states that he last had it 19mins ago and had some indigestion with it. Describes as a pressure that comes and goes." Caller advised by on call nurse to call EMS/911 now. Pt refused 911 but will have wife drive him to ED. Insurance: HTA   Outreach attempt #2 to patient. No answer at present and unable to leave message.     Plan: RN CM will make outreach attempt to patient within one business day if no return call.     [name], RN,BSN,CCM [place] Management Telephonic Care Management Coordinator Direct Phone: [phone] Toll Free: [phone] Fax: [phone]

## 2017-10-10 ENCOUNTER — Other Ambulatory Visit: Payer: Self-pay

## 2017-10-10 NOTE — Patient Outreach (Signed)
[name]) Care Management  10/10/2017  Rick Cruz 08-Nov-1945 [id number]   Telephone Screen  Referral Date:10/07/17 Referral Source:Nurse Call Center Report Referral Reason:"caller states that he has been having intermittent left sided chest pain since yesterday, states that he last had it 60mins ago and had some indigestion with it. Describes as a pressure that comes and goes." Caller advised by on call nurse to call EMS/911 now. Pt refused 911 but will have wife drive him to ED. Insurance:HTA   Outreach attempt #3 to patient. No answer at present. RN CM left HIPAA compliant voicemail message along with contact info.     Plan: RN CM send unsuccessful outreach letter to patient and close case if no response within 10 business days.    [name], RN,BSN,CCM [place] Management Telephonic Care Management Coordinator Direct Phone: [phone] Toll Free: [phone] Fax: [phone]

## 2017-10-24 ENCOUNTER — Other Ambulatory Visit: Payer: Self-pay

## 2017-10-24 NOTE — Patient Outreach (Signed)
[facility]) Care Management  10/24/2017  Rick Cruz 01-02-1946 [id number]   Telephone Screen  Referral Date:10/07/17 Referral Source:Nurse Call Center Report Referral Reason:"caller states that he has been having intermittent left sided chest pain since yesterday, states that he last had it 79mins ago and had some indigestion with it. Describes as a pressure that comes and goes." Caller advised by on call nurse to call EMS/911 now. Pt refused 911 but will have wife drive him to ED. Insurance:HTA    Multiple attempts to establish contact with patient without success. No response from letter mailed to patient. Case is being closed at this time.     Plan: RN CM will notify [facility] administrative assistant of case status.   [name], RN,BSN,CCM [place] Management Telephonic Care Management Coordinator Direct Phone: [phone] Toll Free: [phone] Fax: [phone]

## 2017-11-09 DIAGNOSIS — J449 Chronic obstructive pulmonary disease, unspecified: Secondary | ICD-10-CM | POA: Diagnosis not present

## 2017-11-09 DIAGNOSIS — J9611 Chronic respiratory failure with hypoxia: Secondary | ICD-10-CM | POA: Diagnosis not present

## 2017-11-09 DIAGNOSIS — J9622 Acute and chronic respiratory failure with hypercapnia: Secondary | ICD-10-CM | POA: Diagnosis not present

## 2017-12-07 DIAGNOSIS — J9622 Acute and chronic respiratory failure with hypercapnia: Secondary | ICD-10-CM | POA: Diagnosis not present

## 2017-12-07 DIAGNOSIS — J9611 Chronic respiratory failure with hypoxia: Secondary | ICD-10-CM | POA: Diagnosis not present

## 2017-12-07 DIAGNOSIS — J449 Chronic obstructive pulmonary disease, unspecified: Secondary | ICD-10-CM | POA: Diagnosis not present

## 2017-12-09 DIAGNOSIS — I509 Heart failure, unspecified: Secondary | ICD-10-CM | POA: Diagnosis not present

## 2017-12-09 DIAGNOSIS — G4733 Obstructive sleep apnea (adult) (pediatric): Secondary | ICD-10-CM | POA: Diagnosis not present

## 2017-12-09 DIAGNOSIS — J9611 Chronic respiratory failure with hypoxia: Secondary | ICD-10-CM | POA: Diagnosis not present

## 2017-12-09 DIAGNOSIS — J449 Chronic obstructive pulmonary disease, unspecified: Secondary | ICD-10-CM | POA: Diagnosis not present

## 2017-12-13 DIAGNOSIS — J449 Chronic obstructive pulmonary disease, unspecified: Secondary | ICD-10-CM | POA: Diagnosis not present

## 2017-12-13 DIAGNOSIS — I259 Chronic ischemic heart disease, unspecified: Secondary | ICD-10-CM | POA: Diagnosis not present

## 2017-12-13 DIAGNOSIS — E1140 Type 2 diabetes mellitus with diabetic neuropathy, unspecified: Secondary | ICD-10-CM | POA: Diagnosis not present

## 2017-12-13 DIAGNOSIS — R0609 Other forms of dyspnea: Secondary | ICD-10-CM | POA: Diagnosis not present

## 2017-12-13 DIAGNOSIS — L821 Other seborrheic keratosis: Secondary | ICD-10-CM | POA: Diagnosis not present

## 2017-12-13 DIAGNOSIS — E1165 Type 2 diabetes mellitus with hyperglycemia: Secondary | ICD-10-CM | POA: Diagnosis not present

## 2017-12-13 DIAGNOSIS — I10 Essential (primary) hypertension: Secondary | ICD-10-CM | POA: Diagnosis not present

## 2017-12-13 DIAGNOSIS — E7800 Pure hypercholesterolemia, unspecified: Secondary | ICD-10-CM | POA: Diagnosis not present

## 2017-12-13 DIAGNOSIS — K219 Gastro-esophageal reflux disease without esophagitis: Secondary | ICD-10-CM | POA: Insufficient documentation

## 2018-01-07 DIAGNOSIS — J9622 Acute and chronic respiratory failure with hypercapnia: Secondary | ICD-10-CM | POA: Diagnosis not present

## 2018-01-07 DIAGNOSIS — J449 Chronic obstructive pulmonary disease, unspecified: Secondary | ICD-10-CM | POA: Diagnosis not present

## 2018-01-07 DIAGNOSIS — J9611 Chronic respiratory failure with hypoxia: Secondary | ICD-10-CM | POA: Diagnosis not present

## 2018-01-09 DIAGNOSIS — M542 Cervicalgia: Secondary | ICD-10-CM | POA: Diagnosis not present

## 2018-01-13 ENCOUNTER — Emergency Department (HOSPITAL_COMMUNITY)
Admission: EM | Admit: 2018-01-13 | Discharge: 2018-01-14 | Disposition: A | Discharge status: Home / Self Care | Payer: PPO | Attending: Emergency Medicine | Admitting: Emergency Medicine

## 2018-01-13 ENCOUNTER — Encounter (HOSPITAL_COMMUNITY): Payer: Self-pay | Admitting: Emergency Medicine

## 2018-01-13 ENCOUNTER — Other Ambulatory Visit: Payer: Self-pay

## 2018-01-13 ENCOUNTER — Emergency Department (HOSPITAL_COMMUNITY): Payer: PPO

## 2018-01-13 DIAGNOSIS — Z79899 Other long term (current) drug therapy: Secondary | ICD-10-CM | POA: Insufficient documentation

## 2018-01-13 DIAGNOSIS — R0600 Dyspnea, unspecified: Secondary | ICD-10-CM | POA: Insufficient documentation

## 2018-01-13 DIAGNOSIS — R0602 Shortness of breath: Secondary | ICD-10-CM

## 2018-01-13 DIAGNOSIS — E119 Type 2 diabetes mellitus without complications: Secondary | ICD-10-CM | POA: Diagnosis not present

## 2018-01-13 DIAGNOSIS — F209 Schizophrenia, unspecified: Secondary | ICD-10-CM | POA: Diagnosis not present

## 2018-01-13 DIAGNOSIS — Z87891 Personal history of nicotine dependence: Secondary | ICD-10-CM | POA: Diagnosis not present

## 2018-01-13 DIAGNOSIS — I110 Hypertensive heart disease with heart failure: Secondary | ICD-10-CM | POA: Insufficient documentation

## 2018-01-13 DIAGNOSIS — Z794 Long term (current) use of insulin: Secondary | ICD-10-CM | POA: Insufficient documentation

## 2018-01-13 DIAGNOSIS — I5032 Chronic diastolic (congestive) heart failure: Secondary | ICD-10-CM | POA: Insufficient documentation

## 2018-01-13 DIAGNOSIS — R410 Disorientation, unspecified: Secondary | ICD-10-CM

## 2018-01-13 DIAGNOSIS — R4182 Altered mental status, unspecified: Secondary | ICD-10-CM | POA: Diagnosis not present

## 2018-01-13 DIAGNOSIS — I2510 Atherosclerotic heart disease of native coronary artery without angina pectoris: Secondary | ICD-10-CM | POA: Insufficient documentation

## 2018-01-13 LAB — COMPREHENSIVE METABOLIC PANEL
ALT: 13 U/L — ABNORMAL LOW (ref 17–63)
ANION GAP: 16 — AB (ref 5–15)
AST: 18 U/L (ref 15–41)
Albumin: 4.1 g/dL (ref 3.5–5.0)
Alkaline Phosphatase: 94 U/L (ref 38–126)
BILIRUBIN TOTAL: 0.8 mg/dL (ref 0.3–1.2)
BUN: 12 mg/dL (ref 6–20)
CO2: 28 mmol/L (ref 22–32)
Calcium: 9.5 mg/dL (ref 8.9–10.3)
Chloride: 94 mmol/L — ABNORMAL LOW (ref 101–111)
Creatinine, Ser: 1.01 mg/dL (ref 0.61–1.24)
Glucose, Bld: 364 mg/dL — ABNORMAL HIGH (ref 65–99)
POTASSIUM: 4.6 mmol/L (ref 3.5–5.1)
Sodium: 138 mmol/L (ref 135–145)
TOTAL PROTEIN: 7.8 g/dL (ref 6.5–8.1)

## 2018-01-13 LAB — URINALYSIS, COMPLETE (UACMP) WITH MICROSCOPIC
Bacteria, UA: NONE SEEN
Bilirubin Urine: NEGATIVE
Hgb urine dipstick: NEGATIVE
KETONES UR: 5 mg/dL — AB
LEUKOCYTES UA: NEGATIVE
Nitrite: NEGATIVE
PROTEIN: NEGATIVE mg/dL
Specific Gravity, Urine: 1.021 (ref 1.005–1.030)
pH: 6 (ref 5.0–8.0)

## 2018-01-13 LAB — CBC WITH DIFFERENTIAL/PLATELET
BASOS ABS: 0 10*3/uL (ref 0.0–0.1)
Basophils Relative: 0 %
EOS PCT: 4 %
Eosinophils Absolute: 0.5 10*3/uL (ref 0.0–0.7)
HEMATOCRIT: 41.4 % (ref 39.0–52.0)
HEMOGLOBIN: 11.9 g/dL — AB (ref 13.0–17.0)
LYMPHS PCT: 13 %
Lymphs Abs: 1.8 10*3/uL (ref 0.7–4.0)
MCH: 26.3 pg (ref 26.0–34.0)
MCHC: 28.7 g/dL — ABNORMAL LOW (ref 30.0–36.0)
MCV: 91.4 fL (ref 78.0–100.0)
Monocytes Absolute: 0.8 10*3/uL (ref 0.1–1.0)
Monocytes Relative: 6 %
NEUTROS ABS: 10.7 10*3/uL — AB (ref 1.7–7.7)
NEUTROS PCT: 77 %
PLATELETS: 332 10*3/uL (ref 150–400)
RBC: 4.53 MIL/uL (ref 4.22–5.81)
RDW: 15.2 % (ref 11.5–15.5)
WBC: 13.9 10*3/uL — AB (ref 4.0–10.5)

## 2018-01-13 LAB — BRAIN NATRIURETIC PEPTIDE: B Natriuretic Peptide: 160 pg/mL — ABNORMAL HIGH (ref 0.0–100.0)

## 2018-01-13 LAB — AMMONIA: Ammonia: 30 umol/L (ref 9–35)

## 2018-01-13 LAB — CBG MONITORING, ED: Glucose-Capillary: 332 mg/dL — ABNORMAL HIGH (ref 65–99)

## 2018-01-13 MED ORDER — ALBUTEROL SULFATE (2.5 MG/3ML) 0.083% IN NEBU
5.0000 mg | INHALATION_SOLUTION | Freq: Once | RESPIRATORY_TRACT | Status: AC
  Administered 2018-01-13: 5 mg via RESPIRATORY_TRACT
  Filled 2018-01-13: qty 6

## 2018-01-13 NOTE — ED Notes (Signed)
Diet coke given and urinal placed to attempt urine sample

## 2018-01-13 NOTE — Discharge Instructions (Signed)
As discussed, today's evaluation has been generally reassuring. However, it is important that you resume taking your Lasix as prescribed, and please stop taking the Suboxone. You may elect to discuss appropriate ongoing pain management with your primary care physician.  Please return here for concerning changes in your condition.

## 2018-01-13 NOTE — ED Triage Notes (Signed)
Pt brought to the ER by his wife for AMS pt confused c/o of pain.

## 2018-01-13 NOTE — ED Notes (Signed)
Pt given another diet coke per pt request. Pt states that after drinking another diet coke he might be able to provide sample

## 2018-01-13 NOTE — ED Notes (Signed)
Pt and pt's family member informed of need for urine sample. Pt states that he cannot provide a sample at this time but will as soon as able. Pt provided with urinal.

## 2018-01-13 NOTE — ED Provider Notes (Signed)
[facility] EMERGENCY DEPARTMENT Provider Note   CSN: [id number] Arrival date & time: 01/13/18  1824     History   Chief Complaint Chief Complaint  Patient presents with  . Altered Mental Status    HPI Rick Cruz is a 72 y.o. male.  HPI Patient presents with altered mental status and dyspnea. He arrives via private vehicle, and require substantial assistance to transfer to a hospital gurney, and into evaluation room. The patient himself is diaphoretic, dyspneic, answering questions inconsistently, briefly, oriented to self, roughly to place, not to time. Level 5 caveat secondary to mental status change. He does deny pain, states that he feels poorly all over, short of breath, weak. Events of the patient's wife arrived, she notes that he is more confused than usual, has been so for several days. She recalls that the patient obtained multiple mail-order medication and/or nutritional supplements, possibly including saw palmetto and/or is Suboxone prior to the onset of this episode. She acknowledges multiple medical issues, including diabetes, COPD, smoking, seemingly patient is taking all other medication as directed.  Past Medical History:  Diagnosis Date  . Arthritis   . 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 CHF (congestive heart failure) (HCC)    diastolic   . Chronic pain   . COPD (chronic obstructive pulmonary disease) ([place])   . Depression   . Diabetes mellitus without complication (HCC)    insulin pump  . Hypercholesterolemia   . Hypertension   . On home O2    3 liters  . Oxygen deficiency   . Schizophrenia ([place])   . Sleep apnea    noncompliant with BiPAP  . Sleep apnea   . Tobacco abuse     Patient Active Problem List   Diagnosis Date Noted  . OSA (obstructive sleep apnea)   . Chronic respiratory failure with hypercapnia ([place])   . Undifferentiated schizophrenia ([place])   . Uncontrolled type 2 diabetes mellitus with complication ([place])   . Anemia of  chronic disease   . Paroxysmal atrial fibrillation (HCC)   . Hyponatremia 06/12/2016  . Aspiration pneumonia ([place]) 08/16/2015  . Obesity hypoventilation syndrome ([place]) 04/05/2015  . CSA (central sleep apnea) 04/05/2015  . Non compliance with medical treatment 04/05/2015  . Acute respiratory failure with hypoxia and hypercarbia (HCC)   . Chronic diastolic CHF (congestive heart failure) ([place]) 08/06/2014  . Recurrent falls 05/08/2014  . Ankle fracture, right 05/08/2014  . Ankle fracture, left 05/08/2014  . C. difficile colitis 01/04/2014  . Delirium 01/02/2014  . Acute encephalopathy 01/02/2014  . Morbid obesity ([place]) 06/17/2013  . Erosive esophagitis 04/22/2013  . Chronic respiratory failure 02 dep with hypercarbia 04/22/2013  . Type 2 diabetes mellitus without complication, with long-term current use of insulin ([place]) 04/21/2013  . Paranoid schizophrenia ([place]) 04/18/2013  . CAD (coronary artery disease) 04/18/2013  . DYSLIPIDEMIA 08/25/2008  . Obstructive sleep apnea 08/25/2008  . Essential hypertension 08/25/2008    Past Surgical History:  Procedure Laterality Date  . COLON SURGERY  March 2010   secondary to large colon polyp, final path per discharge summary notes tubulovillous adenoma.   . COLONOSCOPY  July 2011   Dr. Benson [place]: multiple polyps, internal and external hemorrhoids, diverticulosis. tubular adenoma  . ESOPHAGOGASTRODUODENOSCOPY (EGD) WITH PROPOFOL N/A 04/20/2013   Procedure: ESOPHAGOGASTRODUODENOSCOPY (EGD) WITH PROPOFOL;  Surgeon: Daneil Dolin, MD;  Location: [facility];  Service: Endoscopy;  Laterality: N/A;  . KNEE SURGERY     X2  Home Medications    Prior to Admission medications   Medication Sig Start Date End Date Taking? Authorizing Provider  albuterol (PROVENTIL HFA;VENTOLIN HFA) 108 (90 BASE) MCG/ACT inhaler Inhale 2 puffs into the lungs every 6 (six) hours as needed for wheezing or shortness of breath. 05/11/14   Black, Lezlie Octave, NP  albuterol  (PROVENTIL) (2.5 MG/3ML) 0.083% nebulizer solution Take 3 mLs (2.5 mg total) by nebulization every 6 (six) hours as needed for wheezing or shortness of breath. 08/21/15   Kathie Dike, MD  apixaban (ELIQUIS) 5 MG TABS tablet Take 1 tablet (5 mg total) by mouth 2 (two) times daily. 06/15/16   Rai, Ripudeep Raliegh Ip, MD  cholecalciferol (VITAMIN D) 1000 UNITS tablet Take 2,000 Units by mouth daily.    [provider]  Cinnamon 500 MG capsule Take 1,000 mg by mouth every morning.     [provider]  citalopram (CELEXA) 40 MG tablet Take 40 mg by mouth daily.    [provider]  clotrimazole-betamethasone (LOTRISONE) cream Apply 1 application topically 2 (two) times daily. 06/12/17   [provider]  docusate sodium (COLACE) 100 MG capsule Take 100 mg by mouth 2 (two) times daily as needed.     [provider]  furosemide (LASIX) 20 MG tablet Take 3 tablets (60 mg total) by mouth 2 (two) times daily. 06/15/16   Rai, Vernelle Emerald, MD  gabapentin (NEURONTIN) 300 MG capsule Take 1 capsule (300 mg total) by mouth 2 (two) times daily. 05/28/16   Bonnielee Haff, MD  hydrOXYzine (ATARAX/VISTARIL) 25 MG tablet Take 1 tablet by mouth 2 (two) times daily as needed for allergies. 06/12/17   [provider]  insulin aspart (NOVOLOG FLEXPEN) 100 UNIT/ML FlexPen Inject 20 Units into the skin 3 (three) times daily with meals. 08/26/17   [place] Porter, MD  insulin aspart (NOVOLOG) 100 UNIT/ML injection Inject 8 Units into the skin 3 (three) times daily with meals. Patient not taking: Reported on 03/06/2017 06/15/16   Rai, Vernelle Emerald, MD  insulin aspart (NOVOLOG) 100 UNIT/ML injection Inject 0-9 Units into the skin 3 (three) times daily with meals. Sliding scale CBG 70 - 120: 0 units CBG 121 - 150: 1 unit,  CBG 151 - 200: 2 units,  CBG 201 - 250: 3 units,  CBG 251 - 300: 5 units,  CBG 301 - 350: 7 units,  CBG 351 - 400: 9 units   CBG > 400: 9 units and notify your MD 06/15/16   Rai,  Vernelle Emerald, MD  insulin detemir (LEVEMIR) 100 UNIT/ML injection Inject 24 Units into the skin daily.    [provider]  insulin glargine (LANTUS) 100 UNIT/ML injection Inject 0.26 mLs (26 Units total) into the skin at bedtime. 08/26/17   Rolland Porter, MD  loperamide (IMODIUM) 2 MG capsule Take 1 capsule (2 mg total) by mouth 3 (three) times daily as needed for diarrhea or loose stools. 05/29/16   Bonnielee Haff, MD  loratadine (CLARITIN) 10 MG tablet Take 10 mg by mouth daily.    [provider]  LORazepam (ATIVAN) 1 MG tablet Take 0.5 tablets (0.5 mg total) by mouth every 6 (six) hours as needed for anxiety. 06/17/17   Mesner, Corene Cornea, MD  Naproxen Sodium (ALEVE PO) Take 1 tablet by mouth 2 (two) times daily.     [provider]  pantoprazole (PROTONIX) 40 MG tablet Take 40 mg by mouth 2 (two) times daily.    [provider]  saw palmetto  160 MG capsule Take 160 mg by mouth 2 (two) times daily.    Vassie Moment, NP  simvastatin (ZOCOR) 40 MG tablet Take 40 mg by mouth every evening.    [provider]  traZODone (DESYREL) 100 MG tablet Take 100 mg by mouth at bedtime.     [provider]    Family History Family History  Problem Relation Age of Onset  . Thyroid disease Mother   . Parkinson's disease Father   . Parkinson's disease Unknown   . Colon cancer Neg Hx     Social History Social History   Tobacco Use  . Smoking status: Former Smoker    Packs/day: 1.00    Years: 50.00    Pack years: 50.00    Types: Cigarettes    Last attempt to quit: 09/16/2013    Years since quitting: 4.3  . Smokeless tobacco: Never Used  Substance Use Topics  . Alcohol use: No    Comment: history of ETOH abuse in remote past, none in at least 10 years  . Drug use: No     Allergies   Phenergan [promethazine hcl]; Haldol [haloperidol lactate]; and Metformin and related   Review of Systems Review of Systems  Unable to perform ROS: Acuity of  condition     Physical Exam Updated Vital Signs BP (!) 153/77 (BP Location: Right Arm)   Pulse (!) 114   Temp 98.2 F (36.8 C) (Oral)   Resp 14   Ht 6' (1.829 m)   Wt 136.1 kg (300 lb)   SpO2 92%   BMI 40.69 kg/m   Physical Exam  Constitutional: He appears well-developed. No distress.  Uncomfortable appearing obese male diaphoretic, offering only brief verbal responses  HENT:  Head: Normocephalic and atraumatic.  Eyes: Conjunctivae and EOM are normal.  Cardiovascular: Normal rate and regular rhythm.  Pulmonary/Chest: Accessory muscle usage present. Tachypnea noted. He has decreased breath sounds. He has no wheezes.  Abdominal: He exhibits no distension.  Musculoskeletal: He exhibits no edema.  Neurological: He is alert.  Patient is obese, has some atrophy with diminished capacity to transfer himself.  When he does move all extremities spontaneously, has no facial droop. He speaks, though with inconsistent accuracy.  Skin: Skin is warm and dry.  Psychiatric: Cognition and memory are impaired.  Nursing note and vitals reviewed.    ED Treatments / Results  Labs (all labs ordered are listed, but only abnormal results are displayed) Labs Reviewed  COMPREHENSIVE METABOLIC PANEL - Abnormal; Notable for the following components:      Result Value   Chloride 94 (*)    Glucose, Bld 364 (*)    ALT 13 (*)    Anion gap 16 (*)    All other components within normal limits  CBC WITH DIFFERENTIAL/PLATELET - Abnormal; Notable for the following components:   WBC 13.9 (*)    Hemoglobin 11.9 (*)    MCHC 28.7 (*)    Neutro Abs 10.7 (*)    All other components within normal limits  BRAIN NATRIURETIC PEPTIDE - Abnormal; Notable for the following components:   B Natriuretic Peptide 160.0 (*)    All other components within normal limits  CBG MONITORING, ED - Abnormal; Notable for the following components:   Glucose-Capillary 332 (*)    All other components within normal limits    AMMONIA  URINALYSIS, COMPLETE (UACMP) WITH MICROSCOPIC  CBG MONITORING, ED  I-STAT CG4 LACTIC ACID, ED    EKG EKG Interpretation  Date/Time:  Monday January 13 2018 18:35:27 EDT Ventricular Rate:  115 PR Interval:    QRS Duration: 126 QT Interval:  328 QTC Calculation: 938 R Axis:   40 Text Interpretation:  Sinus tachycardia Atrial premature complex Right bundle branch block Abnormal ekg Confirmed by Carmin Muskrat [phone]) on 01/13/2018 6:49:19 PM   Radiology Ct Head Wo Contrast  Result Date: 01/13/2018 CLINICAL DATA:  Pt brought to the ER by his wife for AMS pt confused c/o of pain; schizophrenia; diabetes EXAM: CT HEAD WITHOUT CONTRAST TECHNIQUE: Contiguous axial images were obtained from the base of the skull through the vertex without intravenous contrast. COMPARISON:  07/23/2016 FINDINGS: Brain: Diffuse parenchymal atrophy. Patchy areas of hypoattenuation in deep and periventricular white matter bilaterally. Negative for acute intracranial hemorrhage, mass lesion, acute infarction, midline shift, or mass-effect. Acute infarct may be inapparent on noncontrast CT. Ventricles and sulci symmetric. Vascular: No hyperdense vessel or unexpected calcification. Skull: Normal. Negative for fracture or focal lesion. Sinuses/Orbits: No acute finding. Other: None. IMPRESSION: 1. Negative for bleed or other acute intracranial process. 2. Atrophy and nonspecific white matter changes. Electronically Signed   By: Lucrezia Europe M.D.   On: 01/13/2018 20:44   Dg Chest Port 1 View  Result Date: 01/13/2018 CLINICAL DATA:  Altered level of consciousness EXAM: PORTABLE CHEST 1 VIEW COMPARISON:  07/23/2016 FINDINGS: Chronic cardiomegaly and vascular pedicle widening. Limited visualization of the lower lungs due to patient size and soft tissue attenuation. There is no edema, consolidation, effusion, or pneumothorax. IMPRESSION: 1. Limited portable chest without acute finding. 2. Chronic cardiomegaly.  Electronically Signed   By: Monte Fantasia M.D.   On: 01/13/2018 19:04    Procedures Procedures (including critical care time)  Medications Ordered in ED Medications  albuterol (PROVENTIL) (2.5 MG/3ML) 0.083% nebulizer solution 5 mg (has no administration in time range)     Initial Impression / Assessment and Plan / ED Course  I have reviewed the triage vital signs and the nursing notes.  Pertinent labs & imaging results that were available during my care of the patient were reviewed by me and considered in my medical decision making (see chart for details).   Date: On repeat exam the patient is much more calm, interactive, oriented appropriately. He is now accompanied by his wife, who notes that he over the past 2 days has stopped taking his Lasix due to being sleepy. With initiation of Suboxone, this is a consideration for his sleepiness, as he is currently with distress, speaking clearly.  Discussed today's findings, and notable for mild leukocytosis, but no fever, no evidence for pneumonia, no cutaneous changes suggesting infection. Does have slight elevation in BNP, and given wife's endorsement of not taking Lasix for several days, this is consistent with his worsening dyspnea. Patient will assume his Lasix dosing on discharge. Given his substantial improvement here, suspicion for medication associated delirium, the patient will be appropriate for discharge. Patient is still awaiting urinalysis results, though this will not change his disposition, he may need antibiotics prior to discharge.  Final Clinical Impressions(s) / ED Diagnoses  Altered mental status   Carmin Muskrat, MD 01/13/18 2135

## 2018-01-13 NOTE — Patient Outreach (Signed)
[facility]) Care Management  01/13/2018  Rick Cruz 1946-08-19 [id number]   Referral Date:01/13/18 Referral Source: Nurse Line Referral Reason: Low blood sugar   Outreach Attempt #1 Telephone call for nurse line follow up.  No answer.  HIPAA compliant voice message left.   Plan: RN CM will send letter and attempt patient again within 4 business days.   Jone Baseman, RN, MSN [facility] Care Management Care Management Coordinator Direct Line [phone] Toll Free: [phone]  Fax: [phone]

## 2018-01-14 DIAGNOSIS — E1165 Type 2 diabetes mellitus with hyperglycemia: Secondary | ICD-10-CM | POA: Diagnosis not present

## 2018-01-14 DIAGNOSIS — R4182 Altered mental status, unspecified: Secondary | ICD-10-CM | POA: Diagnosis not present

## 2018-01-16 ENCOUNTER — Other Ambulatory Visit: Payer: Self-pay

## 2018-01-16 NOTE — Patient Outreach (Addendum)
[place] [facility]) Care Management  01/16/2018  KEYSHAWN HELLWIG 02-22-1946 [id number]   Referral Date:01/13/18 Referral Source: Nurse Line Referral Reason: Low blood sugar   Outreach Attempt #2 Telephone call for nurse line follow up.  No answer.  HIPAA compliant voice message left.   Plan: RN CM will attempt patient again within 4 business days.  Jone Baseman, RN, MSN [facility] Care Management Care Management Coordinator Direct Line [phone] Toll Free: [phone]  Fax: [phone]

## 2018-01-20 ENCOUNTER — Other Ambulatory Visit: Payer: Self-pay

## 2018-01-20 NOTE — Patient Outreach (Addendum)
[place] [facility]) Care Management  01/20/2018  Rick Cruz Oct 27, 1945 [id number]   Referral Date:01/13/18 Referral Source:Nurse Line Referral Reason:Low blood sugar   Outreach Attempt #3Telephone call for nurse line follow up. No answer. HIPAA compliant voice message left.   Plan:RN CM will wait for return call.  If no return call will close case.  Rick Baseman, RN, MSN [facility] Care Management Care Management Coordinator Direct Line [phone] Toll Free: [phone]  Fax: [phone]

## 2018-01-21 ENCOUNTER — Ambulatory Visit: Payer: Self-pay

## 2018-01-22 DIAGNOSIS — I10 Essential (primary) hypertension: Secondary | ICD-10-CM | POA: Diagnosis not present

## 2018-01-22 DIAGNOSIS — E1165 Type 2 diabetes mellitus with hyperglycemia: Secondary | ICD-10-CM | POA: Diagnosis not present

## 2018-01-27 ENCOUNTER — Other Ambulatory Visit: Payer: Self-pay

## 2018-01-27 NOTE — Patient Outreach (Signed)
[place] [facility]) Care Management  01/27/2018  Rick Cruz [birth year] [id number]   Multiple attempts to establish contact with patient without success. No response from letter mailed to patient.   Plan: RN CM will close case at this time.   Jone Baseman, RN, MSN [facility] Care Management Care Management Coordinator Direct Line [phone] Toll Free: [phone]  Fax: [phone]

## 2018-02-06 DIAGNOSIS — J449 Chronic obstructive pulmonary disease, unspecified: Secondary | ICD-10-CM | POA: Diagnosis not present

## 2018-02-06 DIAGNOSIS — J9622 Acute and chronic respiratory failure with hypercapnia: Secondary | ICD-10-CM | POA: Diagnosis not present

## 2018-02-06 DIAGNOSIS — J9611 Chronic respiratory failure with hypoxia: Secondary | ICD-10-CM | POA: Diagnosis not present

## 2018-02-12 DIAGNOSIS — E109 Type 1 diabetes mellitus without complications: Secondary | ICD-10-CM | POA: Diagnosis not present

## 2018-02-12 DIAGNOSIS — Z794 Long term (current) use of insulin: Secondary | ICD-10-CM | POA: Diagnosis not present

## 2018-02-15 DEATH — deceased

## 2018-03-04 DIAGNOSIS — E1165 Type 2 diabetes mellitus with hyperglycemia: Secondary | ICD-10-CM | POA: Diagnosis not present

## 2018-03-04 DIAGNOSIS — E7800 Pure hypercholesterolemia, unspecified: Secondary | ICD-10-CM | POA: Diagnosis not present

## 2018-03-04 DIAGNOSIS — I10 Essential (primary) hypertension: Secondary | ICD-10-CM | POA: Diagnosis not present

## 2018-03-09 DIAGNOSIS — J9611 Chronic respiratory failure with hypoxia: Secondary | ICD-10-CM | POA: Diagnosis not present

## 2018-03-09 DIAGNOSIS — J449 Chronic obstructive pulmonary disease, unspecified: Secondary | ICD-10-CM | POA: Diagnosis not present

## 2018-03-09 DIAGNOSIS — J9622 Acute and chronic respiratory failure with hypercapnia: Secondary | ICD-10-CM | POA: Diagnosis not present

## 2018-03-18 DIAGNOSIS — Z794 Long term (current) use of insulin: Secondary | ICD-10-CM | POA: Diagnosis not present

## 2018-03-18 DIAGNOSIS — E109 Type 1 diabetes mellitus without complications: Secondary | ICD-10-CM | POA: Diagnosis not present

## 2018-04-08 DIAGNOSIS — J9622 Acute and chronic respiratory failure with hypercapnia: Secondary | ICD-10-CM | POA: Diagnosis not present

## 2018-04-08 DIAGNOSIS — J9611 Chronic respiratory failure with hypoxia: Secondary | ICD-10-CM | POA: Diagnosis not present

## 2018-04-08 DIAGNOSIS — J449 Chronic obstructive pulmonary disease, unspecified: Secondary | ICD-10-CM | POA: Diagnosis not present

## 2018-04-15 ENCOUNTER — Other Ambulatory Visit: Payer: Self-pay

## 2018-04-15 NOTE — Patient Outreach (Signed)
[place] [facility]) Care Management  04/15/2018  Rick Cruz 1946/03/21 [id number]   Referral Date: 04/15/18 Referral Source: HTA concierge Referral Reason: On insulin pump and needing medication management   Outreach Attempt #1 No answer.  HIPAA compliant voice message left.   Plan: RN CM will send letter and attempt patient again within 4 business days.     Jone Baseman, RN, MSN [facility] Care Management Care Management Coordinator Direct Line [phone] Toll Free: [phone]  Fax: [phone]

## 2018-04-18 ENCOUNTER — Other Ambulatory Visit: Payer: Self-pay

## 2018-04-18 DIAGNOSIS — Z794 Long term (current) use of insulin: Secondary | ICD-10-CM | POA: Diagnosis not present

## 2018-04-18 DIAGNOSIS — E109 Type 1 diabetes mellitus without complications: Secondary | ICD-10-CM | POA: Diagnosis not present

## 2018-04-18 NOTE — Patient Outreach (Signed)
[place] [facility]) Care Management  04/18/2018  Rick Cruz 09/11/1946 [id number]    Referral Date: 04/15/18 Referral Source: HTA concierge Referral Reason: On insulin pump and needing medication management   Outreach Attempt #2 No answer.  HIPAA compliant voice message left.  Plan: RN CM will attempt patient again within 4 business days.  Jone Baseman, RN, MSN [place] Management Care Management Coordinator Direct Line [phone] Cell [phone] Toll Free: [phone]  Fax: [phone]

## 2018-04-21 ENCOUNTER — Ambulatory Visit: Payer: Self-pay

## 2018-04-21 ENCOUNTER — Other Ambulatory Visit: Payer: Self-pay

## 2018-04-21 NOTE — Patient Outreach (Signed)
[place] [facility]) Care Management  04/21/2018  Rick Cruz 01-28-1946 [id number]   Referral Date: 04/15/18 Referral Source: HTA Concierge Referral Reason: Medication Management and insulin pump   Outreach Attempt: Spoke with patient.  He is able to verify HIPAA.  Discussed reason for referral.  Patient reports that he is having problems affording his insulin pump.  He states that with that he controls his sugars better.  Asked patient about his blood sugars and range and he is unable to tell me.  Discussed with patient diease management and support around his diabetes, COPD, and heart failure .  He states that he feels like he does not need that presently but is interested in pharmacy assisting him in getting his insulin pump back.    Patient lives in the home with his wife and states he is needs assistance with some things.  Patient states that he manages his own medications.      Consent: Patient agreeable to pharmacy referral to assist with obtaining insulin pump.    Plan: RN CM will refer to pharmacy for assistance in obtaining insulin pump.     Jone Baseman, RN, MSN [facility] Care Management Care Management Coordinator Direct Line [phone] Toll Free: [phone]  Fax: [phone]

## 2018-04-22 ENCOUNTER — Ambulatory Visit: Payer: Self-pay

## 2018-04-24 ENCOUNTER — Other Ambulatory Visit: Payer: Self-pay

## 2018-04-24 NOTE — Patient Outreach (Signed)
[place] [facility]) Care Management  04/24/2018  KAYVION ARNESON 1946-06-09 [id number]  72 year old male referred to [place] Management.  [place] services requested for medication assistance with an insulin pump.  PMHx includes, but not limited to, hypertension, CAD, diastolic heart failure, atrial fibrillation, erosive esophagitis, Type 2 diabetes mellitus and dyslipidemia.   Unsuccessful outreach attempt #1 to Mr. Mckay.  Left HIPAA compliant voice message requesting a return call.  Plan: Outreach attempt in 3-4 business days.  [name], PharmD Clinical Pharmacist [place] [phone]

## 2018-04-29 ENCOUNTER — Other Ambulatory Visit: Payer: Self-pay

## 2018-04-29 NOTE — Patient Outreach (Signed)
[place] [facility]) Care Management  04/29/2018  Rick Cruz [birth year] [id number]  72 year old male referred to [place] Management.  [place] services requested for medication assistance with an insulin pump.  PMHx includes, but not limited to, hypertension, CAD, diastolic heart failure, atrial fibrillation, erosive esophagitis, Type 2 diabetes mellitus and dyslipidemia.   Unsuccessful outreach attempt #2 to [name].  Left HIPAA compliant voice message requesting a return call.  Plan: Outreach attempt #3 in 3-4 business days.   Send unsuccessful outreach attempt letter.  [name], PharmD Clinical Pharmacist [place] [phone]

## 2018-05-01 ENCOUNTER — Other Ambulatory Visit: Payer: Self-pay

## 2018-05-01 NOTE — Patient Outreach (Addendum)
[place] [facility]) Care Management  05/01/2018  Rick Cruz Sep 09, 1946 [id number]  72year old malereferred to [place] Management.[place] services requested formedication assistance with an insulin pump. PMHx includes, but not limited to, hypertension, CAD, diastolic heart failure, atrial fibrillation, erosive esophagitis, Type 2 diabetes mellitus and dyslipidemia.   Unsuccessful outreach attempt #3 to Mr. Callaway. Left HIPAA compliant voice message requesting a return call.  Plan: If no response, will close the [facility] case on 05/07/18.  [name], PharmD Clinical Pharmacist [place] [phone]

## 2018-05-02 ENCOUNTER — Ambulatory Visit: Payer: Self-pay

## 2018-05-07 ENCOUNTER — Other Ambulatory Visit: Payer: Self-pay

## 2018-05-07 NOTE — Patient Outreach (Signed)
[place] [facility]) Care Management  05/07/2018  DELMONT PROSCH 02/15/1946 [id number]  [place] has been unable to establish contact with Mr. Grattan.   Plan: Close [place] case.  Route closure letter to PCP, Dr. [name].  [name], PharmD Clinical Pharmacist [place] [phone]

## 2018-05-09 DIAGNOSIS — J449 Chronic obstructive pulmonary disease, unspecified: Secondary | ICD-10-CM | POA: Diagnosis not present

## 2018-05-11 DIAGNOSIS — K5900 Constipation, unspecified: Secondary | ICD-10-CM | POA: Diagnosis not present

## 2018-05-11 DIAGNOSIS — J449 Chronic obstructive pulmonary disease, unspecified: Secondary | ICD-10-CM | POA: Diagnosis not present

## 2018-05-11 DIAGNOSIS — I2510 Atherosclerotic heart disease of native coronary artery without angina pectoris: Secondary | ICD-10-CM | POA: Diagnosis not present

## 2018-05-11 DIAGNOSIS — J9612 Chronic respiratory failure with hypercapnia: Secondary | ICD-10-CM | POA: Diagnosis not present

## 2018-05-11 DIAGNOSIS — S92401A Displaced unspecified fracture of right great toe, initial encounter for closed fracture: Secondary | ICD-10-CM | POA: Diagnosis not present

## 2018-05-11 DIAGNOSIS — Z6839 Body mass index (BMI) 39.0-39.9, adult: Secondary | ICD-10-CM | POA: Diagnosis not present

## 2018-05-11 DIAGNOSIS — S93115A Dislocation of interphalangeal joint of left lesser toe(s), initial encounter: Secondary | ICD-10-CM | POA: Diagnosis not present

## 2018-05-11 DIAGNOSIS — I509 Heart failure, unspecified: Secondary | ICD-10-CM | POA: Diagnosis not present

## 2018-05-11 DIAGNOSIS — I110 Hypertensive heart disease with heart failure: Secondary | ICD-10-CM | POA: Diagnosis not present

## 2018-05-11 DIAGNOSIS — E785 Hyperlipidemia, unspecified: Secondary | ICD-10-CM | POA: Diagnosis not present

## 2018-05-11 DIAGNOSIS — H5461 Unqualified visual loss, right eye, normal vision left eye: Secondary | ICD-10-CM | POA: Diagnosis not present

## 2018-05-11 DIAGNOSIS — E1142 Type 2 diabetes mellitus with diabetic polyneuropathy: Secondary | ICD-10-CM | POA: Diagnosis not present

## 2018-05-11 DIAGNOSIS — F1021 Alcohol dependence, in remission: Secondary | ICD-10-CM | POA: Diagnosis not present

## 2018-05-11 DIAGNOSIS — H5440 Blindness, one eye, unspecified eye: Secondary | ICD-10-CM | POA: Insufficient documentation

## 2018-05-11 DIAGNOSIS — I5042 Chronic combined systolic (congestive) and diastolic (congestive) heart failure: Secondary | ICD-10-CM | POA: Diagnosis not present

## 2018-05-11 DIAGNOSIS — I480 Paroxysmal atrial fibrillation: Secondary | ICD-10-CM | POA: Diagnosis not present

## 2018-05-11 DIAGNOSIS — S91115A Laceration without foreign body of left lesser toe(s) without damage to nail, initial encounter: Secondary | ICD-10-CM | POA: Diagnosis not present

## 2018-05-11 DIAGNOSIS — S92532B Displaced fracture of distal phalanx of left lesser toe(s), initial encounter for open fracture: Secondary | ICD-10-CM | POA: Diagnosis not present

## 2018-05-11 DIAGNOSIS — R52 Pain, unspecified: Secondary | ICD-10-CM | POA: Diagnosis not present

## 2018-05-11 DIAGNOSIS — S92912A Unspecified fracture of left toe(s), initial encounter for closed fracture: Secondary | ICD-10-CM | POA: Diagnosis not present

## 2018-05-11 DIAGNOSIS — M79676 Pain in unspecified toe(s): Secondary | ICD-10-CM | POA: Diagnosis not present

## 2018-05-11 DIAGNOSIS — Z9981 Dependence on supplemental oxygen: Secondary | ICD-10-CM | POA: Diagnosis not present

## 2018-05-11 DIAGNOSIS — Z87891 Personal history of nicotine dependence: Secondary | ICD-10-CM | POA: Diagnosis not present

## 2018-05-11 DIAGNOSIS — E119 Type 2 diabetes mellitus without complications: Secondary | ICD-10-CM | POA: Diagnosis not present

## 2018-05-11 DIAGNOSIS — R296 Repeated falls: Secondary | ICD-10-CM | POA: Diagnosis not present

## 2018-05-22 ENCOUNTER — Encounter: Payer: Self-pay | Admitting: *Deleted

## 2018-05-22 ENCOUNTER — Other Ambulatory Visit: Payer: Self-pay | Admitting: *Deleted

## 2018-05-22 NOTE — Patient Outreach (Signed)
Transition of care call completed. Mr. Rick Cruz is in [place] currently. He and his wife have a second residence there. He stepped on something he did not know what there and that caused him to fall and dislocate a toe. He had to have surgery and is wearing a walking boot. He is to follow up with the surgeon on 05/30/18.  He states he will be getting another type of health insurance that will cover him where ever he goes in October.  He agrees to participate in weekly transition of care calls.  I am sending him our information to his address in [place].  Fall Risk  05/22/2018 03/06/2017 12/21/2016  Falls in the past year? Yes No Yes  Number falls in past yr: - - 2 or more  Injury with Fall? Yes - No  Risk Factor Category  High Fall Risk - High Fall Risk  Risk for fall due to : History of fall(s);Impaired balance/gait History of fall(s) Impaired balance/gait;Impaired mobility  Follow up - - Falls evaluation completed;Education provided;Falls prevention discussed   Depression screen [facility] 2/9 05/22/2018 04/21/2018 03/06/2017 01/09/2017  Decreased Interest 0 0 2 2  Down, Depressed, Hopeless 0 1 2 2   PHQ - 2 Score 0 1 4 4   Altered sleeping - - 1 1  Tired, decreased energy - - 3 3  Change in appetite - - 0 0  Feeling bad or failure about yourself  - - 0 0  Trouble concentrating - - 0 0  Moving slowly or fidgety/restless - - 1 1  Suicidal thoughts - - 0 0  PHQ-9 Score - - 9 9  Difficult doing work/chores - - Somewhat difficult Somewhat difficult   Patient was recently discharged from hospital and all medications have been reviewed.  Outpatient Encounter Medications as of 05/22/2018  Medication Sig Note  . albuterol (PROVENTIL HFA;VENTOLIN HFA) 108 (90 BASE) MCG/ACT inhaler Inhale 2 puffs into the lungs every 6 (six) hours as needed for wheezing or shortness of breath.   Marland Kitchen albuterol (PROVENTIL) (2.5 MG/3ML) 0.083% nebulizer solution Take 3 mLs (2.5 mg total) by nebulization every 6 (six) hours as  needed for wheezing or shortness of breath.   Marland Kitchen apixaban (ELIQUIS) 5 MG TABS tablet Take 1 tablet (5 mg total) by mouth 2 (two) times daily. 01/13/2018: [facility], [place], [place]  . cholecalciferol (VITAMIN D) 1000 UNITS tablet Take 2,000 Units by mouth daily.   . Cinnamon 500 MG capsule Take 1,000 mg by mouth every morning.    . citalopram (CELEXA) 40 MG tablet Take 40 mg by mouth daily.   . clotrimazole-betamethasone (LOTRISONE) cream Apply 1 application topically 2 (two) times daily.   . furosemide (LASIX) 20 MG tablet Take 3 tablets (60 mg total) by mouth 2 (two) times daily. 01/13/2018: [facility], [place]-Never picked up per pharmacy records  . gabapentin (NEURONTIN) 300 MG capsule Take 1 capsule (300 mg total) by mouth 2 (two) times daily. 03/06/2017: Pt now states he is taking Neurontin 300 mg qid not bid   . hydrOXYzine (ATARAX/VISTARIL) 25 MG tablet Take 1 tablet by mouth 2 (two) times daily as needed for allergies.   Marland Kitchen insulin aspart (NOVOLOG FLEXPEN) 100 UNIT/ML FlexPen Inject 20 Units into the skin 3 (three) times daily with meals. 05/22/2018: Pt reports he is taking 30 units, 3X a day.  . pantoprazole (PROTONIX) 40 MG tablet Take 40 mg by mouth 2 (two) times daily.   . saw palmetto 160 MG capsule Take 160 mg by mouth  2 (two) times daily.   . simvastatin (ZOCOR) 40 MG tablet Take 40 mg by mouth every evening. 01/13/2018: [facility], [place], [place]  . traMADol (ULTRAM) 50 MG tablet Take 50 mg by mouth every 6 (six) hours as needed for moderate pain.   . traZODone (DESYREL) 100 MG tablet Take 100 mg by mouth at bedtime.    . buprenorphine-naloxone (SUBOXONE) 8-2 mg SUBL SL tablet Place 0.5 tablets under the tongue daily. 01/13/2018: Filled 01/09/18 per pharmacy records (5 day supply) [facility], [place], [place]  . docusate sodium (COLACE) 100 MG capsule Take 100 mg by mouth 2 (two) times daily as needed.    . insulin glargine (LANTUS) 100 UNIT/ML injection Inject 0.26 mLs (26 Units total) into  the skin at bedtime. 01/13/2018: [facility], [place], [place]  . loperamide (IMODIUM) 2 MG capsule Take 1 capsule (2 mg total) by mouth 3 (three) times daily as needed for diarrhea or loose stools. (Patient not taking: Reported on 05/22/2018)   . loratadine (CLARITIN) 10 MG tablet Take 10 mg by mouth daily. 03/06/2017: Pt reports he is now using mucinex 2 bid or q 12 hours   . [DISCONTINUED] insulin aspart (NOVOLOG) 100 UNIT/ML injection Inject 8 Units into the skin 3 (three) times daily with meals. (Patient not taking: Reported on 03/06/2017) 03/06/2017: Pt states he is now on an insulin pump  . [DISCONTINUED] insulin aspart (NOVOLOG) 100 UNIT/ML injection Inject 0-9 Units into the skin 3 (three) times daily with meals. Sliding scale CBG 70 - 120: 0 units CBG 121 - 150: 1 unit,  CBG 151 - 200: 2 units,  CBG 201 - 250: 3 units,  CBG 251 - 300: 5 units,  CBG 301 - 350: 7 units,  CBG 351 - 400: 9 units   CBG > 400: 9 units and notify your MD 03/06/2017: Pt states he is now on an insulin pump   . [DISCONTINUED] insulin detemir (LEVEMIR) 100 UNIT/ML injection Inject 24 Units into the skin daily. 03/06/2017: Pt states he is now on an insulin pump   . [DISCONTINUED] LORazepam (ATIVAN) 1 MG tablet Take 0.5 tablets (0.5 mg total) by mouth every 6 (six) hours as needed for anxiety.   . [DISCONTINUED] Naproxen Sodium (ALEVE PO) Take 1 tablet by mouth 2 (two) times daily.     No facility-administered encounter medications on file as of 05/22/2018.    THN CM Care Plan Problem One     Most Recent Value  Care Plan for Problem One  Active  THN Long Term Goal   Pt will not be hospitalized over the next 60 days.  THN Long Term Goal Start Date  05/22/18  Interventions for Problem One Long Term Goal  Made contact, advised of program benefits, discussed DM foot care, HF and COPD Action plans.  THN CM Short Term Goal #1   Pt will participate in Transition of Care Calls each week for the next 30 days.  THN CM Short Term Goal #1  Start Date  05/22/18  Interventions for Short Term Goal #1  Advised of Transition of care program and pt agrees to weekly phone calls.  THN CM Short Term Goal #2   Pt will call NP if he has any problems or questions over the next 30 days.  THN CM Short Term Goal #2 Start Date  05/22/18  Interventions for Short Term Goal #2  Advised I am available and made sure he has my number, also sending written information.  THN CM Short Term  Goal #3  Pt willl get a scale and start weighing daily over the next 2 weeks.  THN CM Short Term Goal #3 Start Date  05/22/18  Interventions for Short Tern Goal #3  Advised that with HF he should be weighing every day and recording his wt and calling me if wt goes up 3-5#.      Eulah Pont. Myrtie Neither, MSN, GNP-BC Gerontological Nurse Practitioner. [facility] Management [phone]  hospital and all medications have been reviewed.

## 2018-05-30 ENCOUNTER — Other Ambulatory Visit: Payer: Self-pay | Admitting: *Deleted

## 2018-05-30 DIAGNOSIS — S93115D Dislocation of interphalangeal joint of left lesser toe(s), subsequent encounter: Secondary | ICD-10-CM | POA: Diagnosis not present

## 2018-05-30 NOTE — Patient Outreach (Signed)
Transition of care call week #2. Rick Cruz did not answer his phone today. I have left him a message and requested that he return my call. If I do not hear back from him I will call him on Monday.  Eulah Pont. Myrtie Neither, MSN, [facility] Gerontological Nurse Practitioner [facility] Care Management [phone]

## 2018-06-06 ENCOUNTER — Other Ambulatory Visit: Payer: Self-pay | Admitting: *Deleted

## 2018-06-06 ENCOUNTER — Encounter: Payer: Self-pay | Admitting: *Deleted

## 2018-06-06 NOTE — Patient Outreach (Signed)
Transition of care call attempted. Called both listed numbers and both have full mailboxes. Pt is in [place], [place] according to our initial conversation.  I will send him a letter since his mailboxes are full and request that he return my call.  Eulah Pont. Myrtie Neither, MSN, [facility] Gerontological Nurse Practitioner [facility] Care Management [phone]

## 2018-06-13 ENCOUNTER — Other Ambulatory Visit: Payer: Self-pay | Admitting: *Deleted

## 2018-06-13 NOTE — Patient Outreach (Signed)
Transition of care call attempted without success. This is the 3rd week I have not been able to reach him and I did send him a letter after last weeks call. At this time I am going to close his case, since I cannot reach him.  Eulah Pont. Myrtie Neither, MSN, [facility] Gerontological Nurse Practitioner [facility] Care Management [phone]

## 2018-06-18 ENCOUNTER — Ambulatory Visit: Payer: Self-pay | Admitting: *Deleted

## 2018-06-20 ENCOUNTER — Ambulatory Visit: Payer: Self-pay | Admitting: *Deleted

## 2018-06-20 DIAGNOSIS — S93115D Dislocation of interphalangeal joint of left lesser toe(s), subsequent encounter: Secondary | ICD-10-CM | POA: Diagnosis not present

## 2018-06-24 DIAGNOSIS — I10 Essential (primary) hypertension: Secondary | ICD-10-CM | POA: Diagnosis not present

## 2018-06-24 DIAGNOSIS — Z794 Long term (current) use of insulin: Secondary | ICD-10-CM | POA: Diagnosis not present

## 2018-06-24 DIAGNOSIS — I2510 Atherosclerotic heart disease of native coronary artery without angina pectoris: Secondary | ICD-10-CM | POA: Diagnosis not present

## 2018-06-24 DIAGNOSIS — G9341 Metabolic encephalopathy: Secondary | ICD-10-CM | POA: Diagnosis not present

## 2018-06-24 DIAGNOSIS — I5042 Chronic combined systolic (congestive) and diastolic (congestive) heart failure: Secondary | ICD-10-CM | POA: Diagnosis not present

## 2018-06-24 DIAGNOSIS — Z9981 Dependence on supplemental oxygen: Secondary | ICD-10-CM | POA: Diagnosis not present

## 2018-06-24 DIAGNOSIS — R9431 Abnormal electrocardiogram [ECG] [EKG]: Secondary | ICD-10-CM | POA: Diagnosis not present

## 2018-06-24 DIAGNOSIS — Z6837 Body mass index (BMI) 37.0-37.9, adult: Secondary | ICD-10-CM | POA: Diagnosis not present

## 2018-06-24 DIAGNOSIS — W19XXXA Unspecified fall, initial encounter: Secondary | ICD-10-CM | POA: Diagnosis not present

## 2018-06-24 DIAGNOSIS — E1140 Type 2 diabetes mellitus with diabetic neuropathy, unspecified: Secondary | ICD-10-CM | POA: Diagnosis not present

## 2018-06-24 DIAGNOSIS — I110 Hypertensive heart disease with heart failure: Secondary | ICD-10-CM | POA: Diagnosis not present

## 2018-06-24 DIAGNOSIS — J449 Chronic obstructive pulmonary disease, unspecified: Secondary | ICD-10-CM | POA: Diagnosis not present

## 2018-06-24 DIAGNOSIS — E785 Hyperlipidemia, unspecified: Secondary | ICD-10-CM | POA: Diagnosis not present

## 2018-06-24 DIAGNOSIS — Z87891 Personal history of nicotine dependence: Secondary | ICD-10-CM | POA: Diagnosis not present

## 2018-06-24 DIAGNOSIS — D649 Anemia, unspecified: Secondary | ICD-10-CM | POA: Diagnosis not present

## 2018-06-24 DIAGNOSIS — I480 Paroxysmal atrial fibrillation: Secondary | ICD-10-CM | POA: Diagnosis not present

## 2018-06-24 DIAGNOSIS — Z7901 Long term (current) use of anticoagulants: Secondary | ICD-10-CM | POA: Diagnosis not present

## 2018-06-24 DIAGNOSIS — E1165 Type 2 diabetes mellitus with hyperglycemia: Secondary | ICD-10-CM | POA: Diagnosis not present

## 2018-06-24 DIAGNOSIS — G9340 Encephalopathy, unspecified: Secondary | ICD-10-CM | POA: Diagnosis not present

## 2018-06-24 DIAGNOSIS — R739 Hyperglycemia, unspecified: Secondary | ICD-10-CM | POA: Diagnosis not present

## 2018-06-24 DIAGNOSIS — R7309 Other abnormal glucose: Secondary | ICD-10-CM | POA: Diagnosis not present

## 2018-06-24 DIAGNOSIS — E1149 Type 2 diabetes mellitus with other diabetic neurological complication: Secondary | ICD-10-CM | POA: Diagnosis not present

## 2018-06-24 DIAGNOSIS — R000 Tachycardia, unspecified: Secondary | ICD-10-CM | POA: Diagnosis not present

## 2018-06-24 DIAGNOSIS — F209 Schizophrenia, unspecified: Secondary | ICD-10-CM | POA: Diagnosis not present

## 2018-06-24 DIAGNOSIS — R296 Repeated falls: Secondary | ICD-10-CM | POA: Diagnosis not present

## 2018-06-24 DIAGNOSIS — R2990 Unspecified symptoms and signs involving the nervous system: Secondary | ICD-10-CM | POA: Diagnosis not present

## 2018-06-24 DIAGNOSIS — S098XXA Other specified injuries of head, initial encounter: Secondary | ICD-10-CM | POA: Diagnosis not present

## 2018-06-24 DIAGNOSIS — R4182 Altered mental status, unspecified: Secondary | ICD-10-CM | POA: Diagnosis not present

## 2018-06-24 DIAGNOSIS — J9622 Acute and chronic respiratory failure with hypercapnia: Secondary | ICD-10-CM | POA: Diagnosis not present

## 2018-06-25 DIAGNOSIS — Z9981 Dependence on supplemental oxygen: Secondary | ICD-10-CM | POA: Insufficient documentation

## 2018-06-25 DIAGNOSIS — W19XXXA Unspecified fall, initial encounter: Secondary | ICD-10-CM | POA: Insufficient documentation

## 2018-07-01 ENCOUNTER — Other Ambulatory Visit: Payer: Self-pay

## 2018-07-01 NOTE — Patient Outreach (Signed)
[name]) Care Management  07/01/2018  NIEVES BARBERI 1945/12/20 [id number]     Transition of Care Referral  Referral Date: 07/01/18 Referral Source: HTA Discharge Report Date of Admission: unknown Diagnosis: "AMS" Date of Discharge: 06/26/18 Facility: [facility] Insurance: HTA   Referral received. No outreach warranted at this time. TOC will be completed by primary care provider office who will refer to [facility] care mgmt if needed.    Plan: RN CM will close case at this time.    [name], RN,BSN,CCM [place] Management Telephonic Care Management Coordinator Direct Phone: [phone] Toll Free: [phone] Fax: [phone]

## 2018-07-02 ENCOUNTER — Other Ambulatory Visit: Payer: Self-pay

## 2018-07-02 NOTE — Patient Outreach (Signed)
  [facility]) Care Management  [place]  07/02/2018  Rick Cruz 26-Nov-1945 [id number]  72year old male outreached by [facility] services for a 30 day post discharge medication review.  PMHx includes, but not limited to, hypertension, coronary artery disease, atrial fibrillation, erosive esophagitis, type 2 diabetes mellitus and dyslipidemia.  Unsuccessful telephone call attempt # 1 to patient.   Unable to leave message on home and mobile number.  Mailboxes were full.  Plan:  I will make another outreach attempt to patient within 3-4 business days.   [name], PharmD Clinical Pharmacist [place] [phone]

## 2018-07-03 ENCOUNTER — Other Ambulatory Visit: Payer: Self-pay

## 2018-07-03 NOTE — Patient Outreach (Addendum)
[facility]) Care Management  [place]   07/03/2018  Rick Cruz 1946/08/29 [id number]   72year old male outreached by [facility] services for a 30 day post discharge medication review.  PMHx includes, but not limited to, hypertension, coronary artery disease, atrial fibrillation, erosive esophagitis, type 2 diabetes mellitus and dyslipidemia.  Unsuccessful outreach attempt to Rick Cruz. Rick Cruz wife, Rick Cruz.  Left HIPAA compliant voice message requesting return call.  Incoming call received from Rick Cruz and HIPAA identifiers verified.  Her phone number is [phone].  Subjective:  Rick Cruz reports that they have been living in [place] for the past several months.  The phone number listed for Rick Cruz is their [place] phone number.  She states that she hopes to go back to their [place] home next week.  She states that Rick Cruz had a recent hospitalization for metabolic encephalopathy, but is now home and doing better.  Wife states that Rick Cruz' insulin pump broke "months ago" (possibly last winter when questioned.)  Wife states that he was supposed to fill out paperwork at [facility] to get a new pump, but they never did.  She reports that he is currently on Novolog 40 units TID with meals and Lantus 30 units at bedtime.  He uses a [employer] to monitor his glucose.  Wife states they keep his unopened insulin pens in the refrigerator and the opened pen at room temperature.  Wife states that Rick Cruz adjusts his own insulin and eats whatever her wants.  She states that he craves sweets and is up all night and sleeps all day. She states that he sees Rick Cruz, but that he is on leave. Wife says that she needs to make patient an appointment with primary care and Rick Cruz (pulmonary) when they return to [facility].   Wife states that they may come home today or tomorrow.  She reports that he will need PT and OT when he returns.  Rick Cruz states they did not realize care in [place] is out of network and that they have a lot of medical bills.  She reports that they are able to afford his prescriptions.   Objective:  SCr 0.98mg /dL on 06/26/18 HgA1c 9% on 06/25/18 Urine cx >100,000 col/ml of Klebsiella aerogenes Resistant - Ampicillin, Unasyn, Augmentin Intermediate- nitrofurantoin Sensitive - Septra, Gentamicin, Ciprofloxacin, Zosyn,, Ertapenem  Current Medications: Current Outpatient Medications  Medication Sig Dispense Refill  . acetaminophen (TYLENOL) 325 MG tablet Take 2 tablets by mouth every 4 (four) hours as needed.    Marland Kitchen albuterol (PROVENTIL HFA;VENTOLIN HFA) 108 (90 BASE) MCG/ACT inhaler Inhale 2 puffs into the lungs every 6 (six) hours as needed for wheezing or shortness of breath. 1 Inhaler 2  . apixaban (ELIQUIS) 5 MG TABS tablet Take 1 tablet (5 mg total) by mouth 2 (two) times daily. 60 tablet 1  . Cinnamon 500 MG capsule Take 1,000 mg by mouth every morning.     . citalopram (CELEXA) 40 MG tablet Take 40 mg by mouth daily.    Marland Kitchen docusate sodium (COLACE) 100 MG capsule Take 100 mg by mouth 2 (two) times daily as needed.     . furosemide (LASIX) 20 MG tablet Take 3 tablets (60 mg total) by mouth 2 (two) times daily. 30 tablet   . gabapentin (NEURONTIN) 300 MG capsule Take 300 mg by mouth 4 (four) times daily.    . pantoprazole (PROTONIX) 40 MG tablet Take 40 mg by mouth 2 (  two) times daily.    . saw palmetto 160 MG capsule Take 160 mg by mouth 2 (two) times daily.    . simvastatin (ZOCOR) 40 MG tablet Take 40 mg by mouth every evening.    . traZODone (DESYREL) 100 MG tablet Take 100 mg by mouth at bedtime.     Marland Kitchen albuterol (PROVENTIL) (2.5 MG/3ML) 0.083% nebulizer solution Take 3 mLs (2.5 mg total) by nebulization every 6 (six) hours as needed for wheezing or shortness of breath. (Patient not taking: Reported on 07/03/2018) 75 mL 0  . cholecalciferol (VITAMIN D) 1000 UNITS tablet Take 2,000 Units by mouth  daily.    . clotrimazole-betamethasone (LOTRISONE) cream Apply 1 application topically 2 (two) times daily.  0  . hydrOXYzine (ATARAX/VISTARIL) 25 MG tablet Take 1 tablet by mouth 2 (two) times daily as needed for allergies.  0  . insulin aspart (NOVOLOG FLEXPEN) 100 UNIT/ML FlexPen Inject 20 Units into the skin 3 (three) times daily with meals. 15 mL 0  . insulin glargine (LANTUS) 100 UNIT/ML injection Inject 0.26 mLs (26 Units total) into the skin at bedtime. 10 mL 0  . loperamide (IMODIUM) 2 MG capsule Take 1 capsule (2 mg total) by mouth 3 (three) times daily as needed for diarrhea or loose stools. (Patient not taking: Reported on 05/22/2018) 30 capsule 0  . loratadine (CLARITIN) 10 MG tablet Take 10 mg by mouth daily.    . traMADol (ULTRAM) 50 MG tablet Take 50 mg by mouth every 6 (six) hours as needed for moderate pain.     No current facility-administered medications for this visit.     Functional Status: In your present state of health, do you have any difficulty performing the following activities: 05/22/2018  Hearing? N  Vision? 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  Difficulty concentrating or making decisions? N  Walking or climbing stairs? Y  Dressing or bathing? N  Doing errands, shopping? N  Preparing Food and eating ? N  Using the Toilet? N  In the past six months, have you accidently leaked urine? N  Do you have problems with loss of bowel control? N  Managing your Medications? N  Managing your Finances? N  Housekeeping or managing your Housekeeping? N  Some recent data might be hidden    Fall/Depression Screening: Fall Risk  05/22/2018 03/06/2017 12/21/2016  Falls in the past year? Yes No Yes  Number falls in past yr: - - 2 or more  Injury with Fall? Yes - No  Risk Factor Category  High Fall Risk - High Fall Risk  Risk for fall due to : History of fall(s);Impaired balance/gait History of fall(s) Impaired balance/gait;Impaired mobility  Follow up - - Falls evaluation  completed;Education provided;Falls prevention discussed   PHQ 2/9 Scores 05/22/2018 04/21/2018 03/06/2017 01/09/2017  PHQ - 2 Score 0 1 4 4   PHQ- 9 Score - - 9 9   ASSESSMENT: Date Discharged from Hospital: 06/26/18 Date Medication Reconciliation Performed: 07/03/2018  No new medications were prescribed at discharge.  Patient was recently discharged from hospital and all medications have been reviewed  Drugs sorted by system:  Neurologic/Psychologic: citalopram, trazodone  Cardiovascular: apixaban, furosemide, simvastatin   Pulmonary/Allergy: albuterol MDI and nebs, hydroxyzine, loratadine  Gastrointestinal: docusate sodium, loperamide  Endocrine: insulin aspart, insulin glargine  Topical: clotrimazole/betamethasone  Pain: acetaminophen, gabapentin  Vitamins/Minerals:  Cholecalciferol  Miscellaneous: cinnamon, saw palmetto  Duplications in therapy:   Gaps in therapy:  Patient with type 2 diabetes mellitus, may benefit from  the addition of an ACEI or ARB for nephro-protection.  Medications to avoid in the elderly:  Per the Beers List, hydroxyzine is highly anticholinergic and clearance is reduced with advanced age. Risk of confusion, dry mouth, constipation and other anticholinergic effects or toxicity may occur.  There is strong evidence to avoid use in the elderly.   Other issues noted:   Patient's wife reports he uses Novolog 40 units TID with meals and Lantus 30 units at bedtime, which is different than his prescriptions on record.  Call placed to PCP office requesting clarification of dose and if warranted will ask that new prescriptions be sent to his pharmacy  Patient's wife reports that he is out of hydroxyzine.  While this medication is on the Beers list, she reports that he has severe itching and that it really helps.   She would like a refill.   Gabapentin is being taken QID.  Recommend changing to a TID regimen for increased compliance.  May increase dose if  needed.    Patient needs a new insulin pump.    Per Care  Everywhere From [facility]- Rick Cruz has a UTI growing >100,000 col/ml of Klebsiella aerogenes, which was resulted back 2 days after his discharge.  Review of [facility] chart does not indicate that he received inpatient treatment and wife confirms he was not given oral antibiotics at discharge.  Per wife, urine was collected via condom cath and patient is asymptomatic.  Wife states that they may return to [place] over the weekend.  Recommend she make an appointment to evaluate for UTI when home.  Rick Cruz has outreached to their local office and PA is evaluating and will call them back.  Per review of his dispense history, he had two insulin [facility] with incorrect days supply entered.  They dispensed 12 ml as a 30 days supply, when it should have been entered as a 10 day supply.  They also dispensed 15 ml as a 25 day supply, when it should have been a 12 day supply.   [facility] will communicate with HTA and explain why insulins are always "too soon to refill."  Rick Cruz. Vercher needs a new prescription for pen needles.    PLAN: Referral to [facility] NP, Rick Cruz when patient is back in [place].   Outreach to patient tomorrow to see if they are back in [place] and schedule home visit.   Route note to PCP, Rick Cruz.  [name], PharmD Clinical Pharmacist [place] [phone]  Addendum: Incoming call received from Dr. Julianne Cruz office.   Nurse reports that Rick Cruz. Shader has not been seen since last May.   At May visit his insulin regimen was: Novolog Flexpen: 30 units QID before meals  Lantus Solostar: Inject up to  50 units every evening   Office requests that Rick Cruz. Lamagna comes in for an appointment.  Plan: Help facilitate appointment once family is back in town.  [name], PharmD Clinical Pharmacist [place] [phone]

## 2018-07-04 ENCOUNTER — Ambulatory Visit: Payer: Self-pay | Admitting: *Deleted

## 2018-07-04 ENCOUNTER — Other Ambulatory Visit: Payer: Self-pay

## 2018-07-04 NOTE — Patient Outreach (Signed)
[facility]) Care Management  07/04/2018  DEMARQUS JOCSON January 13, 1946 [id number]  Incoming call received from Mrs. Jacqulyn Liner.  HIPAA identifiers verified.  Medication Management: Mrs. Raimondi reports that she received a call from the MD that saw Mr. Labrador at [facility] and he confirmed that he does have a UTI.  He reports that [facility].   Mrs. Seidman states that they are coming back to [place] today.  Plan: Outreach to patient on Monday to schedule home visit and help facilitate PCP visit.   [name], PharmD Clinical Pharmacist [place] [phone]

## 2018-07-07 ENCOUNTER — Other Ambulatory Visit: Payer: Self-pay

## 2018-07-07 ENCOUNTER — Ambulatory Visit: Payer: Self-pay

## 2018-07-07 NOTE — Patient Outreach (Addendum)
[facility]) Care Management  [place]  07/07/2018  Rick Cruz 01/13/1946 [id number]  89year oldmaleoutreached by [facility] services for a 30 day post discharge medication review. PMHx includes, but not limited to, hypertension, coronary artery disease, atrial fibrillation, erosive esophagitis, type 2 diabetes mellitus and dyslipidemia.  Unsuccessful outreach attempt to Mr. Rick Cruz wife, Rick Cruz.  Left HIPAA compliant voice message requesting return call.  Plan:  I will make outreach attempt #2 tomorrow.   [name], PharmD Clinical Pharmacist [place] [phone]  Addendum: Incoming call received from Rick Cruz.  HIPAA identifiers verified.  Medication Management: Rick Cruz reports that he needs a refill of his Novolog Flexpen, but it was last filled 10/14 for a 10 day supply, so it is 3-5 days too early.  Wife states that he has ~ 160 units left and that he also needs an early refill on his Freestyle Alligator sensors. She reports that the sensor are too early because they had to buy a new reader, so they had to change the sensor early and that one sensor did not stick.  She states that Rick Cruz "eats what he wants to eat and then tries to play catch up with his insulin."  She also states that she continues to buy him snacks because he will fuss constantly until she buys more.  Rick Cruz' states that they were preparing to come back to [place] this past Saturday when she heard an "explosion."  She states that Rick Cruz had stood up from the toilet, got dizzy and sat back down on the toilet hard.  The porcelain toilet busted, which caused a major water leak.  Wife states that she is not sure exactly what day she will get back to [place].      Call placed to Rick Cruz at [facility] and insurance granted him an override to fill his Novolog early (4 pens) and his [employer] (1 month).  Informed Rick Cruz that  these prescriptions can be now be refilled.  She was very Patent attorney.  Made an agreement with the patient that his wife will now administer his insulin, so we know how much he actually gets.  Wife if unsure if he will cooperate.  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    Call placed to PCP's office to ask for an order for a new insulin pump. Marita Snellen at [facility] states that they have the paperwork ready and that all he needs to do is to come in and sign it and he can get a new insulin pump.  They are going to waive the co-pay too.  Will communicate this to Rick Cruz.   Rick Cruz states she will call me when she gets back to [place].  Plan: Outreach to the [facility]' Friday, if I have not heard from them.  [name], PharmD Clinical Pharmacist [place] [phone]

## 2018-07-08 ENCOUNTER — Other Ambulatory Visit: Payer: Self-pay | Admitting: *Deleted

## 2018-07-08 ENCOUNTER — Ambulatory Visit: Payer: Self-pay

## 2018-07-08 NOTE — Patient Outreach (Signed)
Telephone outreach to follow up on High Risk HTA patient. He and his wife have been residing in [place], [place] for some months now and they have reported on several occasions they are going to return to [place].  I have called all listed numbers and was finally able to leave a message on Mrs. Ucci cell phone. I have requested she return my call.  Eulah Pont. Myrtie Neither, MSN, [facility] Gerontological Nurse Practitioner [facility] Management [phone]

## 2018-07-11 ENCOUNTER — Ambulatory Visit: Payer: Self-pay

## 2018-07-11 ENCOUNTER — Other Ambulatory Visit: Payer: Self-pay

## 2018-07-11 NOTE — Patient Outreach (Addendum)
[place] [facility]) Care Management  [place]  07/11/2018  Rick Cruz [birth year] [id number]  56year oldmaleoutreached by [place] services for a 30 day post discharge medication review. PMHx includes, but not limited to, hypertension, coronary artery disease, atrial fibrillation, erosive esophagitis, type 2 diabetes mellitus and dyslipidemia.  Unsuccessful telephone call attempt # 1to patient to see if they have returned to [place].  Unable to leave message, HIPAA compliant voicemail left requesting a return call on the wife's cell phone.  Plan:  I will make another outreach attempt to patient within 3-4 business days.  [name], PharmD Clinical Pharmacist [place] [phone]

## 2018-07-14 ENCOUNTER — Ambulatory Visit: Payer: PPO | Admitting: Podiatry

## 2018-07-16 ENCOUNTER — Other Ambulatory Visit: Payer: Self-pay

## 2018-07-16 ENCOUNTER — Ambulatory Visit: Payer: Self-pay

## 2018-07-16 NOTE — Patient Outreach (Addendum)
[place] [facility]) Care Management  [place]  07/16/2018  Rick Cruz 1946-02-15 [id number]  31year oldmaleoutreached by [place] services for a 30 day post discharge medication review. PMHx includes, but not limited to, hypertension, coronary artery disease, atrial fibrillation, erosive esophagitis, type 2 diabetes mellitus and dyslipidemia.  Unsuccessful outreach attempt to Rick Cruz wife, Rick Cruz. Left HIPAA compliant voice message requesting a return call on wife's cell and their home phone number in [place].  Unsuccessful telephone call attempt # 2 to patient.    HIPAA compliant voicemail left requesting a return call.  Plan:  I will make another outreach attempt to patient within 3-4 business days.  [name], PharmD Clinical Pharmacist [place] [phone]  Incoming call received from Ms. Cruz.  HIPAA identifiers verified.   Rick Cruz reports that they are back in [place].  She states that Rick Cruz is still being difficult with his insulin and not always allowing her to give him his dose.  She reports that his shower chair broke recently and she broke his fall, but subsequently she hit her head and has a bruised hip.  She denies that she needs to see a doctor.     I facilitated getting Rick Cruz an appointment at [facility] on 11/6 at 1:30 pm.  Rick Cruz has an appointment on 11/11 at 11:30 am.  Wife is agreeable to home visit and I will coordinate with [facility] NP, Deloria Lair to make a joint visit.   Plan: Outreach to Rick Cruz about possible dates for visit when I hear back from NP.  [name], [place] [phone]

## 2018-07-18 ENCOUNTER — Ambulatory Visit (HOSPITAL_COMMUNITY)
Admission: EM | Admit: 2018-07-18 | Discharge: 2018-07-18 | Disposition: A | Discharge status: Home / Self Care | Payer: PPO | Attending: Family Medicine | Admitting: Family Medicine

## 2018-07-18 ENCOUNTER — Ambulatory Visit: Payer: Self-pay | Admitting: *Deleted

## 2018-07-18 ENCOUNTER — Other Ambulatory Visit: Payer: Self-pay

## 2018-07-18 ENCOUNTER — Encounter (HOSPITAL_COMMUNITY): Payer: Self-pay | Admitting: Emergency Medicine

## 2018-07-18 DIAGNOSIS — Z23 Encounter for immunization: Secondary | ICD-10-CM

## 2018-07-18 DIAGNOSIS — Z8673 Personal history of transient ischemic attack (TIA), and cerebral infarction without residual deficits: Secondary | ICD-10-CM | POA: Diagnosis not present

## 2018-07-18 DIAGNOSIS — S91201A Unspecified open wound of right great toe with damage to nail, initial encounter: Secondary | ICD-10-CM | POA: Diagnosis not present

## 2018-07-18 DIAGNOSIS — E119 Type 2 diabetes mellitus without complications: Secondary | ICD-10-CM | POA: Diagnosis not present

## 2018-07-18 DIAGNOSIS — W010XXA Fall on same level from slipping, tripping and stumbling without subsequent striking against object, initial encounter: Secondary | ICD-10-CM | POA: Diagnosis not present

## 2018-07-18 DIAGNOSIS — S91209A Unspecified open wound of unspecified toe(s) with damage to nail, initial encounter: Secondary | ICD-10-CM

## 2018-07-18 MED ORDER — TETANUS-DIPHTH-ACELL PERTUSSIS 5-2.5-18.5 LF-MCG/0.5 IM SUSP
INTRAMUSCULAR | Status: AC
  Filled 2018-07-18: qty 0.5

## 2018-07-18 MED ORDER — CEPHALEXIN 500 MG PO CAPS: 500.0000 mg | ORAL_CAPSULE | Freq: Two times a day (BID) | ORAL | 0 refills | Status: DC

## 2018-07-18 MED ORDER — TETANUS-DIPHTH-ACELL PERTUSSIS 5-2.5-18.5 LF-MCG/0.5 IM SUSP
0.5000 mL | Freq: Once | INTRAMUSCULAR | Status: AC
  Administered 2018-07-18: 0.5 mL via INTRAMUSCULAR

## 2018-07-18 MED ORDER — TETANUS-DIPHTHERIA TOXOIDS TD 5-2 LFU IM INJ: 0.5000 mL | INJECTION | Freq: Once | INTRAMUSCULAR | Status: DC

## 2018-07-18 MED ORDER — FLUORESCEIN SODIUM 1 MG OP STRP
ORAL_STRIP | OPHTHALMIC | Status: AC
  Filled 2018-07-18: qty 1

## 2018-07-18 NOTE — ED Triage Notes (Signed)
Patient says he tripped over items in the floor.  As a result he fell.  Reports this was 2 nights ago. Last night reports left great toenail was partially removed.  Today, left great toenail fell off.   cbg 109 this morning, this evening 200 +.

## 2018-07-18 NOTE — Patient Outreach (Signed)
[place] [facility]) Care Management  07/18/2018  Rick Cruz 18-Oct-1945 [id number]   Successful outreach call to [name] wife, [name].  HIPAA identifiers verified.   Home visit scheduled for Tuesday 11/5 at 1:30 with Deloria Lair, NP and myself.  [name], PharmD Clinical Pharmacist [place] [phone]

## 2018-07-18 NOTE — ED Notes (Signed)
Removed dressing from toe.

## 2018-07-18 NOTE — ED Provider Notes (Signed)
[place]    CSN: [id number] Arrival date & time: 07/18/18  1738     History   Chief Complaint Chief Complaint  Patient presents with  . Toe Injury    HPI Rick Cruz is a 72 y.o. male.   HPI  72 year old gentleman who has multiple medical problems. He is here with his wife who states he refuses to do his walker even though he has gait instability and several falls He was stepping over some items on the floor 2 days ago, tripped and fell.  He did not have any pain.  He had some bleeding around his great toenail on the right foot.  Today the toenail fell off.  The here to have it looked at.  The bleeding is controlled.  He is on Eliquis. Unknown tetanus status. Because he has neuropathy he cannot really feel any pain in the toe.  Likewise he has had foot surgeries, amputations, and foot infections in the past.   Past Medical History:  Diagnosis Date  . Arthritis   . 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 CHF (congestive heart failure) (HCC)    diastolic   . Chronic pain   . COPD (chronic obstructive pulmonary disease) ([place])   . Depression   . Diabetes mellitus without complication (HCC)    insulin pump  . Hypercholesterolemia   . Hypertension   . On home O2    3 liters  . Oxygen deficiency   . Schizophrenia ([place])   . Sleep apnea    noncompliant with BiPAP  . Sleep apnea   . Tobacco abuse     Patient Active Problem List   Diagnosis Date Noted  . OSA (obstructive sleep apnea)   . Chronic respiratory failure with hypercapnia ([place])   . Undifferentiated schizophrenia ([place])   . Uncontrolled type 2 diabetes mellitus with complication ([place])   . Anemia of chronic disease   . Paroxysmal atrial fibrillation (HCC)   . Hyponatremia 06/12/2016  . Aspiration pneumonia ([place]) 08/16/2015  . Obesity hypoventilation syndrome ([place]) 04/05/2015  . CSA (central sleep apnea) 04/05/2015  . Non compliance with medical treatment 04/05/2015  . Acute respiratory  failure with hypoxia and hypercarbia (HCC)   . Chronic diastolic CHF (congestive heart failure) ([place]) 08/06/2014  . Recurrent falls 05/08/2014  . Ankle fracture, right 05/08/2014  . Ankle fracture, left 05/08/2014  . C. difficile colitis 01/04/2014  . Delirium 01/02/2014  . Acute encephalopathy 01/02/2014  . Morbid obesity ([place]) 06/17/2013  . Erosive esophagitis 04/22/2013  . Chronic respiratory failure 02 dep with hypercarbia 04/22/2013  . Type 2 diabetes mellitus without complication, with long-term current use of insulin ([place]) 04/21/2013  . Paranoid schizophrenia ([place]) 04/18/2013  . CAD (coronary artery disease) 04/18/2013  . DYSLIPIDEMIA 08/25/2008  . Obstructive sleep apnea 08/25/2008  . Essential hypertension 08/25/2008    Past Surgical History:  Procedure Laterality Date  . COLON SURGERY  March 2010   secondary to large colon polyp, final path per discharge summary notes tubulovillous adenoma.   . COLONOSCOPY  July 2011   Dr. Benson Norway: multiple polyps, internal and external hemorrhoids, diverticulosis. tubular adenoma  . ESOPHAGOGASTRODUODENOSCOPY (EGD) WITH PROPOFOL N/A 04/20/2013   Procedure: ESOPHAGOGASTRODUODENOSCOPY (EGD) WITH PROPOFOL;  Surgeon: Daneil Dolin, MD;  Location: [facility];  Service: Endoscopy;  Laterality: N/A;  . KNEE SURGERY     X2       Home Medications    Prior to Admission medications  Medication Sig Start Date End Date Taking? Authorizing Provider  acetaminophen (TYLENOL) 325 MG tablet Take 2 tablets by mouth every 4 (four) hours as needed. 05/17/18   [provider]  albuterol (PROVENTIL HFA;VENTOLIN HFA) 108 (90 BASE) MCG/ACT inhaler Inhale 2 puffs into the lungs every 6 (six) hours as needed for wheezing or shortness of breath. 05/11/14   Black, Lezlie Octave, NP  albuterol (PROVENTIL) (2.5 MG/3ML) 0.083% nebulizer solution Take 3 mLs (2.5 mg total) by nebulization every 6 (six) hours as needed for wheezing or shortness of breath. Patient not  taking: Reported on 07/03/2018 08/21/15   Kathie Dike, MD  apixaban (ELIQUIS) 5 MG TABS tablet Take 1 tablet (5 mg total) by mouth 2 (two) times daily. 06/15/16   Rai, Vernelle Emerald, MD  cephALEXin (KEFLEX) 500 MG capsule Take 1 capsule (500 mg total) by mouth 2 (two) times daily. 07/18/18   Raylene Everts, MD  cholecalciferol (VITAMIN D) 1000 UNITS tablet Take 2,000 Units by mouth daily.    [provider]  Cinnamon 500 MG capsule Take 1,000 mg by mouth every morning.     [provider]  citalopram (CELEXA) 40 MG tablet Take 40 mg by mouth daily.    [provider]  clotrimazole-betamethasone (LOTRISONE) cream Apply 1 application topically 2 (two) times daily. 06/12/17   [provider]  docusate sodium (COLACE) 100 MG capsule Take 100 mg by mouth 2 (two) times daily as needed.     [provider]  furosemide (LASIX) 20 MG tablet Take 3 tablets (60 mg total) by mouth 2 (two) times daily. 06/15/16   Rai, Vernelle Emerald, MD  gabapentin (NEURONTIN) 300 MG capsule Take 300 mg by mouth 4 (four) times daily.    [provider]  hydrOXYzine (ATARAX/VISTARIL) 25 MG tablet Take 1 tablet by mouth 2 (two) times daily as needed for allergies. 06/12/17   [provider]  insulin aspart (NOVOLOG FLEXPEN) 100 UNIT/ML FlexPen Inject 20 Units into the skin 3 (three) times daily with meals. 08/26/17   Rolland Porter, MD  insulin glargine (LANTUS) 100 UNIT/ML injection Inject 0.26 mLs (26 Units total) into the skin at bedtime. Patient taking differently: Inject 26 Units into the skin at bedtime. Patient reports using 30 units at bedtime. 08/26/17   Rolland Porter, MD  loratadine (CLARITIN) 10 MG tablet Take 10 mg by mouth daily.    [provider]  pantoprazole (PROTONIX) 40 MG tablet Take 40 mg by mouth 2 (two) times daily.    [provider]  saw palmetto 160 MG capsule Take 160 mg by mouth 2 (two) times daily.    Vassie Moment, NP  simvastatin  (ZOCOR) 40 MG tablet Take 40 mg by mouth every evening.    [provider]  traMADol (ULTRAM) 50 MG tablet Take 50 mg by mouth every 6 (six) hours as needed for moderate pain.    [provider]  traZODone (DESYREL) 100 MG tablet Take 100 mg by mouth at bedtime.     [provider]    Family History Family History  Problem Relation Age of Onset  . Thyroid disease Mother   . Parkinson's disease Father   . Parkinson's disease Unknown   . Colon cancer Neg Hx     Social History Social History   Tobacco Use  . Smoking status: Former Smoker    Packs/day: 1.00    Years: 50.00    Pack years: 50.00    Types: Cigarettes  Last attempt to quit: 09/16/2013    Years since quitting: 4.8  . Smokeless tobacco: Never Used  Substance Use Topics  . Alcohol use: No    Comment: history of ETOH abuse in remote past, none in at least 10 years  . Drug use: No     Allergies   Phenergan [promethazine hcl]; Haldol [haloperidol lactate]; and Metformin and related   Review of Systems Review of Systems  Constitutional: Negative for chills and fever.  HENT: Negative for ear pain and sore throat.   Eyes: Negative for pain and visual disturbance.  Respiratory: Positive for shortness of breath. Negative for cough.        On oxygen chronically  Cardiovascular: Negative for chest pain and palpitations.  Gastrointestinal: Negative for abdominal pain and vomiting.  Genitourinary: Negative for dysuria and hematuria.  Musculoskeletal: Positive for gait problem. Negative for arthralgias and back pain.       Multiple falls  Skin: Positive for wound. Negative for color change and rash.       Wound great toe  Neurological: Negative for seizures and syncope.  Psychiatric/Behavioral: The patient is nervous/anxious.        Chronic condition  All other systems reviewed and are negative.    Physical Exam Triage Vital Signs ED Triage Vitals  Enc Vitals Group     BP 07/18/18  1906 (!) 142/86     Pulse Rate 07/18/18 1906 99     Resp 07/18/18 1906 (!) 22     Temp 07/18/18 1906 (!) 97.2 F (36.2 C)     Temp Source 07/18/18 1906 Temporal     SpO2 07/18/18 1906 97 %     Weight --      Height --      Head Circumference --      Peak Flow --      Pain Score 07/18/18 1904 6     Pain Loc --      Pain Edu? --      Excl. in [place]? --    No data found.  Updated Vital Signs BP (!) 142/86 (BP Location: Left Arm)   Pulse 99   Temp (!) 97.2 F (36.2 C) (Temporal)   Resp (!) 22   SpO2 97%     Physical Exam  Constitutional: He appears well-developed and well-nourished. No distress.  Obese.  In wheelchair.  Visibly anxious and hyperkinetic  HENT:  Head: Normocephalic and atraumatic.  Mouth/Throat: Oropharynx is clear and moist.  Poor dentition  Eyes: Pupils are equal, round, and reactive to light. Conjunctivae are normal.  Neck: Normal range of motion.  Cardiovascular: Normal rate.  Pulmonary/Chest: Effort normal. No respiratory distress.  Abdominal: Soft. He exhibits no distension.  Musculoskeletal: Normal range of motion. He exhibits no edema.  Neurological: He is alert.  Skin: Skin is warm and dry.  Skin, very dry in general, hyperkeratotic plaques on visible arms and legs.  Right great toe has an absent toenail.  There is no bony tenderness of the toe.  Bleeding has stopped.  Psychiatric:  Anxious, slight dysarthria     UC Treatments / Results  Labs (all labs ordered are listed, but only abnormal results are displayed) Labs Reviewed - No data to display  EKG None  Radiology No results found.  Procedures Procedures (including critical care time)  Medications Ordered in UC Medications  Tdap (BOOSTRIX) injection 0.5 mL (has no administration in time range)    Initial Impression / Assessment and Plan / UC  Course  I have reviewed the triage vital signs and the nursing notes.  Pertinent labs & imaging results that were available during my care  of the patient were reviewed by me and considered in my medical decision making (see chart for details).      Final Clinical Impressions(s) / UC Diagnoses   Final diagnoses:  Avulsion of toenail, initial encounter     Discharge Instructions     Soak the foot, apply a clean dressing once a day Take antibiotic 2 times a day to prevent diabetic foot infection See your podiatrist as scheduled on Wednesday   ED Prescriptions    Medication Sig Dispense Auth. Provider   cephALEXin (KEFLEX) 500 MG capsule Take 1 capsule (500 mg total) by mouth 2 (two) times daily. 10 capsule Raylene Everts, MD     Controlled Substance Prescriptions [place] Controlled Substance Registry consulted? Not Applicable   Raylene Everts, MD 07/18/18 1942

## 2018-07-18 NOTE — Discharge Instructions (Signed)
Soak the foot, apply a clean dressing once a day Take antibiotic 2 times a day to prevent diabetic foot infection See your podiatrist as scheduled on Wednesday

## 2018-07-21 ENCOUNTER — Ambulatory Visit: Payer: Self-pay

## 2018-07-22 ENCOUNTER — Other Ambulatory Visit: Payer: Self-pay | Admitting: *Deleted

## 2018-07-22 ENCOUNTER — Encounter

## 2018-07-22 ENCOUNTER — Other Ambulatory Visit: Payer: Self-pay

## 2018-07-22 ENCOUNTER — Ambulatory Visit: Payer: Self-pay

## 2018-07-22 NOTE — Patient Outreach (Addendum)
[facility]) Care Management  [place]   07/22/2018  Rick Cruz 1945-10-31 [id number]  74year oldmaleoutreached by [place] services for a 30 day post discharge medication review. PMHx includes, but not limited to, hypertension, coronary artery disease, atrial fibrillation, erosive esophagitis, type 2 diabetes mellitus and dyslipidemia.  Successful home visit with Mr. & Rick Cruz along with [facility] NP, Rick Cruz.  HIPAA identifiers verified.   Subjective: Rick Cruz' reports that Rick Cruz is up a lot during the night and sleeps during the day.  She states that he eats at inconsistent times based on when he wakes up.  He has a [employer] for continuous glucose monitoring with an average value of 204 mg/dL.  His maximum glucose daily occurs around 0600 when he runs between 250-300 mg/dL.   Wife reports that patient does not follow a diabetic diet and eats whatever he desires.     Objective:  HgA1c 9% on 06/25/18 SCr 0.98mg /dL 06/26/18  Current Medications: Current Outpatient Medications  Medication Sig Dispense Refill  . acetaminophen (TYLENOL) 500 MG tablet Take 1,000 mg by mouth 2 (two) times daily as needed for mild pain or headache.    . albuterol (PROVENTIL HFA;VENTOLIN HFA) 108 (90 BASE) MCG/ACT inhaler Inhale 2 puffs into the lungs every 6 (six) hours as needed for wheezing or shortness of breath. 1 Inhaler 2  . apixaban (ELIQUIS) 5 MG TABS tablet Take 1 tablet (5 mg total) by mouth 2 (two) times daily. 60 tablet 1  . cephALEXin (KEFLEX) 500 MG capsule Take 1 capsule (500 mg total) by mouth 2 (two) times daily. 10 capsule 0  . CINNAMON PO Take 2,000 mg by mouth 2 (two) times daily.    . citalopram (CELEXA) 40 MG tablet Take 40 mg by mouth daily.    . furosemide (LASIX) 20 MG tablet Take 3 tablets (60 mg total) by mouth 2 (two) times daily. 30 tablet   . gabapentin (NEURONTIN) 300 MG capsule Take 300 mg by mouth 4 (four) times daily.    .  GUAIFENESIN PO Take 2 tablets by mouth 2 (two) times daily. 400mg  tablets, takes 2 tablets twice daily.    . insulin aspart (NOVOLOG FLEXPEN) 100 UNIT/ML FlexPen Inject 20 Units into the skin 3 (three) times daily with meals. 15 mL 0  . insulin glargine (LANTUS) 100 UNIT/ML injection Inject 0.26 mLs (26 Units total) into the skin at bedtime. (Patient taking differently: Inject 26 Units into the skin at bedtime. Patient reports using 30 units at bedtime.) 10 mL 0  . Multiple Vitamins-Minerals (CENTRUM SILVER 50+MEN PO) Take 1 tablet by mouth daily.    . Multiple Vitamins-Minerals (EYE VITAMINS PO) Take 2 capsules by mouth 2 (two) times daily. PerserVision    . NON FORMULARY Take 1 capsule by mouth 2 (two) times daily. Omega XL OTC    . pantoprazole (PROTONIX) 40 MG tablet Take 40 mg by mouth 2 (two) times daily.    . Saw Palmetto 450 MG CAPS Take 450 mg by mouth 2 (two) times daily. Taking for BPH    . simvastatin (ZOCOR) 40 MG tablet Take 40 mg by mouth every evening.    . traZODone (DESYREL) 100 MG tablet Take 100 mg by mouth at bedtime.     Marland Kitchen albuterol (PROVENTIL) (2.5 MG/3ML) 0.083% nebulizer solution Take 3 mLs (2.5 mg total) by nebulization every 6 (six) hours as needed for wheezing or shortness of breath. (Patient not taking: Reported on 07/22/2018)  75 mL 0  . cholecalciferol (VITAMIN D) 1000 UNITS tablet Take 2,000 Units by mouth daily.    . hydrOXYzine (ATARAX/VISTARIL) 25 MG tablet Take 1 tablet by mouth 2 (two) times daily as needed for allergies.  0  . traMADol (ULTRAM) 50 MG tablet Take 50 mg by mouth every 6 (six) hours as needed for moderate pain.     No current facility-administered medications for this visit.     Functional Status: In your present state of health, do you have any difficulty performing the following activities: 05/22/2018  Hearing? N  Vision? 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  Difficulty concentrating or making decisions? N  Walking or climbing stairs? Y  Dressing or  bathing? N  Doing errands, shopping? N  Preparing Food and eating ? N  Using the Toilet? N  In the past six months, have you accidently leaked urine? N  Do you have problems with loss of bowel control? N  Managing your Medications? N  Managing your Finances? N  Housekeeping or managing your Housekeeping? N  Some recent data might be hidden    Fall/Depression Screening: Fall Risk  05/22/2018 03/06/2017 12/21/2016  Falls in the past year? Yes No Yes  Number falls in past yr: - - 2 or more  Injury with Fall? Yes - No  Risk Factor Category  High Fall Risk - High Fall Risk  Risk for fall due to : History of fall(s);Impaired balance/gait History of fall(s) Impaired balance/gait;Impaired mobility  Follow up - - Falls evaluation completed;Education provided;Falls prevention discussed   PHQ 2/9 Scores 05/22/2018 04/21/2018 03/06/2017 01/09/2017  PHQ - 2 Score 0 1 4 4   PHQ- 9 Score - - 9 9   ASSESSMENT: Date Discharged from Hospital:  06/26/18 Date Medication Reconciliation Performed: 07/22/2018  No new medications were prescribed at discharge.  Patient was recently discharged from hospital and all medications have been reviewed  Drugs sorted by system:  Neurologic/Psychologic: citalopram, trazodone  Cardiovascular: apixaban, furosemide, simvastatin  Pulmonary/Allergy: albuterol MDI, albuterol neb (not using) guaifenesin, hydroxyzine  Gastrointestinal: pantoprazole   Endocrine: insulin aspart, insulin glargine  Pain: acetaminophen, gabapentin, tramadol   Vitamins/Minerals/Supplements: cholecalciferol, cinnamon, MVI, PerserVision, saw palmetto  Infectious Diseases: cephalexin  Miscellaneous: Omega XL (OTC)  Gaps in therapy:  Patient with type 2 diabetes and not on an ACEI or ARB for nephro-protection.  Patient may benefit from resuming metformin at 500 mg XL once daily with food and titrate up slowly as GI symptoms allow.   Medications to avoid in the elderly:  Per the [place] List,  hydroxyzine is highly anticholinergic and clearance is reduced with advanced age. Risk of confusion, dry mouth, constipation and other anticholinergic effects or toxicity may occur.  There is strong evidence to avoid use in the elderly.  Medication Review Findings: Insulin-patient was able to correctly set his insulin pen to his reported doses for Lantus and Novolog.    Novolog-Patient is giving himself insulin 30 units of Novolog three times daily and not eating consistently. While on home visit, patient had a glucose of 63 mg/dL and was given two glucose tablets and peanut butter crackers.  He admitted to taking Novolog 30 units this morning and not eating "much" breakfast.  Awhile later, patient began napping and was woken and asked to check his glucose.  His CGM reported a value of 46 mg/dL twice. He was given a popsicle by his wife and some bread and cheese at the nurse practioner's request with a follow  up value of 73 mg/dL.  Patient does not seem to be able to reliably eat and manage his insulin therapy.  Given the danger of hypoglycemia, recommend changing him to Lantus only and discontinuing his Novolog for safety.  Insulin Pump-patient's insulin pump is broken and [employer] has the paperwork prepared to get him a new pump.  Patient is planning to sign the documents at the visit on 11/6.  PCP will need to decide if he can safely use an insulin pump or if the insulin regimen should be changed as recommended previously.  Patient will not be able to count carbs and may no be able to use the device correctly and safely.  Tramadol- patient is out of this medication and requests a refill.  Gabapentin - takes 300 mg four times daily.  The usual frequency is three times daily.  Wife and patient report that he has severe neopathy and needs four times daily.   Nebulizer- Machine is broken.  Will see if provider wants to write order for new nebulizer.   Vitamin D- patient reports  using, but none in home.   Hydroxyzine-Patient reports that this medication helps with itching and would like a refill.  Discussed the anticholinergic side effects with patient and suggested a non-sedating antihistamine like loratadine along with using a moistening lotion for dry skin.  Anoro Ellipta Inhaler- Patient reports needing a refill.  None found in home and not on med list.    PLAN: Route note to PCP, Dr. Theda Sers who will see patient on 07/23/18.  [name], PharmD Clinical Pharmacist [place] [phone]

## 2018-07-23 ENCOUNTER — Encounter: Payer: Self-pay | Admitting: *Deleted

## 2018-07-23 DIAGNOSIS — I4891 Unspecified atrial fibrillation: Secondary | ICD-10-CM | POA: Diagnosis not present

## 2018-07-23 DIAGNOSIS — E7800 Pure hypercholesterolemia, unspecified: Secondary | ICD-10-CM | POA: Diagnosis not present

## 2018-07-23 DIAGNOSIS — N390 Urinary tract infection, site not specified: Secondary | ICD-10-CM | POA: Diagnosis not present

## 2018-07-23 DIAGNOSIS — E1165 Type 2 diabetes mellitus with hyperglycemia: Secondary | ICD-10-CM | POA: Diagnosis not present

## 2018-07-23 DIAGNOSIS — J449 Chronic obstructive pulmonary disease, unspecified: Secondary | ICD-10-CM | POA: Diagnosis not present

## 2018-07-23 DIAGNOSIS — L853 Xerosis cutis: Secondary | ICD-10-CM | POA: Diagnosis not present

## 2018-07-23 DIAGNOSIS — I10 Essential (primary) hypertension: Secondary | ICD-10-CM | POA: Diagnosis not present

## 2018-07-23 DIAGNOSIS — E1140 Type 2 diabetes mellitus with diabetic neuropathy, unspecified: Secondary | ICD-10-CM | POA: Diagnosis not present

## 2018-07-23 DIAGNOSIS — M542 Cervicalgia: Secondary | ICD-10-CM | POA: Diagnosis not present

## 2018-07-23 DIAGNOSIS — E118 Type 2 diabetes mellitus with unspecified complications: Secondary | ICD-10-CM | POA: Insufficient documentation

## 2018-07-23 DIAGNOSIS — I259 Chronic ischemic heart disease, unspecified: Secondary | ICD-10-CM | POA: Diagnosis not present

## 2018-07-23 DIAGNOSIS — K219 Gastro-esophageal reflux disease without esophagitis: Secondary | ICD-10-CM | POA: Diagnosis not present

## 2018-07-23 NOTE — Patient Outreach (Addendum)
Initial home visit completed with Rick Cruz, Pharm D. Pt and wife present.  Pt and wife have just come back to [place] after extended time in [place]. Pt has had several admissions in that location. [facility] does not cover that area and pt had to pay out of network fees. They were unaware of this and are now in the process of changing plans to [facility] for coverage in 2020.  Outpatient Encounter Medications as of 07/22/2018  Medication Sig Note  . acetaminophen (TYLENOL) 500 MG tablet Take 1,000 mg by mouth 2 (two) times daily as needed for mild pain or headache.   . albuterol (PROVENTIL HFA;VENTOLIN HFA) 108 (90 BASE) MCG/ACT inhaler Inhale 2 puffs into the lungs every 6 (six) hours as needed for wheezing or shortness of breath.   Rick Cruz albuterol (PROVENTIL) (2.5 MG/3ML) 0.083% nebulizer solution Take 3 mLs (2.5 mg total) by nebulization every 6 (six) hours as needed for wheezing or shortness of breath. (Patient not taking: Reported on 07/22/2018)   . apixaban (ELIQUIS) 5 MG TABS tablet Take 1 tablet (5 mg total) by mouth 2 (two) times daily. 01/13/2018: [facility], [place], [place]  . cephALEXin (KEFLEX) 500 MG capsule Take 1 capsule (500 mg total) by mouth 2 (two) times daily.   . cholecalciferol (VITAMIN D) 1000 UNITS tablet Take 2,000 Units by mouth daily.   Rick Cruz CINNAMON PO Take 2,000 mg by mouth 2 (two) times daily.   . citalopram (CELEXA) 40 MG tablet Take 40 mg by mouth daily.   . furosemide (LASIX) 20 MG tablet Take 3 tablets (60 mg total) by mouth 2 (two) times daily. 01/13/2018: Rick Cruz, [place]-Never picked up per pharmacy records  . gabapentin (NEURONTIN) 300 MG capsule Take 300 mg by mouth 4 (four) times daily.   . GUAIFENESIN PO Take 2 tablets by mouth 2 (two) times daily. 400mg  tablets, takes 2 tablets twice daily.   . hydrOXYzine (ATARAX/VISTARIL) 25 MG tablet Take 1 tablet by mouth 2 (two) times daily as needed for allergies.   Rick Cruz insulin aspart (NOVOLOG FLEXPEN) 100  UNIT/ML FlexPen Inject 20 Units into the skin 3 (three) times daily with meals. 05/22/2018: Pt reports he is taking 30 units, 3X a day.  . insulin glargine (LANTUS) 100 UNIT/ML injection Inject 0.26 mLs (26 Units total) into the skin at bedtime. (Patient taking differently: Inject 26 Units into the skin at bedtime. Patient reports using 30 units at bedtime.) 01/13/2018: [facility], [place], [place]  . Multiple Vitamins-Minerals (CENTRUM SILVER 50+MEN PO) Take 1 tablet by mouth daily.   . Multiple Vitamins-Minerals (EYE VITAMINS PO) Take 2 capsules by mouth 2 (two) times daily. PerserVision   . NON FORMULARY Take 1 capsule by mouth 2 (two) times daily. Omega XL OTC   . pantoprazole (PROTONIX) 40 MG tablet Take 40 mg by mouth 2 (two) times daily.   . Saw Palmetto 450 MG CAPS Take 450 mg by mouth 2 (two) times daily. Taking for BPH   . simvastatin (ZOCOR) 40 MG tablet Take 40 mg by mouth every evening. 01/13/2018: [facility], [place], [place]  . traMADol (ULTRAM) 50 MG tablet Take 50 mg by mouth every 6 (six) hours as needed for moderate pain.   . traZODone (DESYREL) 100 MG tablet Take 100 mg by mouth at bedtime.     No facility-administered encounter medications on file as of 07/22/2018.    Objective: Home is very cluttered, unorganized, many fall risks, medications located in multiple locations. Note minimal food in refrigerator. Minimal food in  cabinets.  BP 130/80   Pulse 81   Resp 20   SpO2 98% Comment: On 3L by [place] FBS 147. BS dropped to 43 and then came up to 73 after treatment before we departed. Pt speech is  Garbled, difficult to understand, wife often has to clarify what pt has said. He is only somewhat attentive to the interview and often figits with other items keeping his hands busy. He is alert most of the time and oriented. He dozed several times during the interview. Pt does not use his walker at all, although this has been recommended due to unsteadiness, neuropathy and past falls.  Fall  Risk  07/23/2018 05/22/2018 03/06/2017 12/21/2016  Falls in the past year? 1 Yes No Yes  Number falls in past yr: 1 - - 2 or more  Injury with Fall? 1 Yes - No  Risk Factor Category  - High Fall Risk - High Fall Risk  Risk for fall due to : History of fall(s);Impaired vision;Impaired balance/gait;Medication side effect;Mental status change History of fall(s);Impaired balance/gait History of fall(s) Impaired balance/gait;Impaired mobility  Follow up Falls evaluation completed;Education provided;Falls prevention discussed - - Falls evaluation completed;Education provided;Falls prevention discussed   Depression screen [facility] 2/9 07/23/2018 05/22/2018 04/21/2018 03/06/2017 01/09/2017  Decreased Interest 0 0 0 2 2  Down, Depressed, Hopeless 0 0 1 2 2   PHQ - 2 Score 0 0 1 4 4   Altered sleeping - - - 1 1  Tired, decreased energy - - - 3 3  Change in appetite - - - 0 0  Feeling bad or failure about yourself  - - - 0 0  Trouble concentrating - - - 0 0  Moving slowly or fidgety/restless - - - 1 1  Suicidal thoughts - - - 0 0  PHQ-9 Score - - - 9 9  Difficult doing work/chores - - - Somewhat difficult Somewhat difficult    AFIB Lungs are clear, he is wearing O2 at 3L by [place]. Lower Extremities: No edema, Ace wrap to L foot which is falling off - unwrapped foot and removed bandage from L great toe. Nail was taken off recently and is moist, no bleeding, healthy pink skin integrating. @nd  toe has healing scab at area where surgery was done.Soaked foot in warm soapy water, dried and applied vaseline gauze, 2X2 and wrap.  Assessment: DM with PVD ( not controlled at all, pt does not follow medical recommendations and wife provides what pt wants to eat to avoid being gripped at.)                               In addition, pt does not follow his insulin dosing regimen and is at great risk for hypoglycemia. He is NOT safe using novolog.  Plan: Pharmacist and myself will try to work with this couple over the next 1-2 months  to improve his diabetes control and safety profile. They intend to return to [place] in the near future. Will encourage them to designative a primary care provider in [place] and avoid ED for primary care needs. Pharmacist has sent her evaluation to primary care provider.   Pt needs medication list minimized, he is using a lot of OTCs that are not of any evidenced based value.  THN CM Care Plan Problem One     Most Recent Value  Care Plan Problem One  Pt does not follow diabetic diet or prescribed medicaton regimen  Role Documenting the Problem One  Care Management Coordinator  Care Plan for Problem One  Active  THN Long Term Goal   Pt will follow newly prescribed medication regimen when cleared with primary MD.  [facility] Long Term Goal Start Date  07/23/18  Interventions for Problem One Long Term Goal  Pharm D and myself agree that current regimen is not safe, provide suggestions to MD for pt safety and simplification of regimen.,  THN CM Short Term Goal #1   Wife will buy healthy foods and avoid buying concentrated sweets.  THN CM Short Term Goal #1 Start Date  07/23/18  Interventions for Short Term Goal #1  Pharm D gave short grocery list to start. We discussed avoiding concentrated sweets on a daily basis.  THN CM Short Term Goal #2   Pt glucose levels will be under 200mg  50% of the time at the end of the next 2 weeks.  THN CM Short Term Goal #2 Start Date  07/23/18  Interventions for Short Term Goal #2  Med regimen will be requested to be simplified. Educated pt and wife on diet, especially limitation of carbs and concentrated sweets.    THN CM Care Plan Problem Two     Most Recent Value  Care Plan Problem Two  High risk for falls.  (Pended)   Role Documenting the Problem Two  Care Management Coordinator  (Pended)   Care Plan for Problem Two  Active  (Pended)      I will call pt again in one week to follow up.  Eulah Pont. Myrtie Neither, MSN, [facility] Gerontological Nurse Practitioner [facility]  Care Management [phone]

## 2018-07-24 ENCOUNTER — Ambulatory Visit: Payer: Self-pay

## 2018-07-24 ENCOUNTER — Other Ambulatory Visit: Payer: Self-pay

## 2018-07-24 NOTE — Patient Outreach (Signed)
[facility]) Care Management  [place]  07/24/2018  MELVIN WHITEFORD 12/15/1945 [id number]    12year oldmaleoutreached by [place] services for a 30 day post discharge medication review followed by medication management. PMHx includes, but not limited to, hypertension, coronary artery disease, atrial fibrillation, erosive esophagitis, type 2 diabetes mellitus and dyslipidemia.  Unsuccessful telephone call attempt # 1 to Mr.and Mrs. Drinkard. HIPAA compliant voicemail left requesting a return call.  Incoming call received from Ms. Ekstrom this afternoon and HIAA identifiers verified.  She states that Mr. Pelzel will see Dr. Garnet Koyanagi, Endocrinology on Monday.  Mrs. Dineen confirmed the changes listed below made by Dr. Theda Sers at PCP appointment yesterday.   (PCP Notes have been scanned into Epic under the Media tab)   From note from PCP appointment: ASAP referral to endocrinologist. Order for new nebulizer faxed to [facility]. Voltaren gel ordered for neck pain and tramadol discontinued.  Fluocinolone Acetonide oil to effected ear. Triamcinolone cream discontinued.  Hydroxyzine refilled with 25 mg every 8 hours prn itching and told to try Dermasmooth lotion. Recheck urine in 1-2 weeks.  Wife to bring in sample.  Plan: Will follow up with Evan's family next week after Endocrinology appointment.   [name], PharmD Clinical Pharmacist [place] [phone]

## 2018-07-28 DIAGNOSIS — I10 Essential (primary) hypertension: Secondary | ICD-10-CM | POA: Diagnosis not present

## 2018-07-28 DIAGNOSIS — I259 Chronic ischemic heart disease, unspecified: Secondary | ICD-10-CM | POA: Diagnosis not present

## 2018-07-28 DIAGNOSIS — E1165 Type 2 diabetes mellitus with hyperglycemia: Secondary | ICD-10-CM | POA: Diagnosis not present

## 2018-07-28 DIAGNOSIS — J449 Chronic obstructive pulmonary disease, unspecified: Secondary | ICD-10-CM | POA: Diagnosis not present

## 2018-07-28 DIAGNOSIS — E1140 Type 2 diabetes mellitus with diabetic neuropathy, unspecified: Secondary | ICD-10-CM | POA: Diagnosis not present

## 2018-07-28 DIAGNOSIS — E118 Type 2 diabetes mellitus with unspecified complications: Secondary | ICD-10-CM | POA: Diagnosis not present

## 2018-07-28 DIAGNOSIS — E7800 Pure hypercholesterolemia, unspecified: Secondary | ICD-10-CM | POA: Diagnosis not present

## 2018-07-28 DIAGNOSIS — I4891 Unspecified atrial fibrillation: Secondary | ICD-10-CM | POA: Diagnosis not present

## 2018-07-29 ENCOUNTER — Other Ambulatory Visit: Payer: Self-pay

## 2018-07-29 ENCOUNTER — Encounter: Payer: Self-pay | Admitting: *Deleted

## 2018-07-29 NOTE — Patient Outreach (Signed)
[facility]) Care Management  07/29/2018  Rick Cruz [birth year] [id number]  89year oldmaleoutreached by [place] services for a 30 day post discharge medication review. PMHx includes, but not limited to, hypertension, coronary artery disease, atrial fibrillation, erosive esophagitis, type 2 diabetes mellitus and dyslipidemia.  Successful outreach to Rick Cruz.  HIPAA identifiers verified.   Rick Cruz states that Rick Cruz saw endocrinology (Dr. Garnet Koyanagi) yesterday.   She reports that his short acting insulin was decreased (descripton sounds like SSI) and his dose of Lantus was increased.  She states he received his Shingles vaccine yesterday and will see his podiatrist Wednesday.  Wife reports that endocrinologist is contacting cardiology to see if he can change his diuretic.  Rick Cruz states that her husband has lost weight, down to 276 lbs from ~ 295 lbs and that he has not had anymore episodes of hypoglycemia.    Plan: Outreach to [employer] and ask for fax of office notes from yesterday's visit.  Will update note and med list once received.  [name], PharmD Clinical Pharmacist [place] [phone]

## 2018-07-30 ENCOUNTER — Ambulatory Visit: Payer: Self-pay

## 2018-07-30 ENCOUNTER — Encounter: Payer: Self-pay | Admitting: Podiatry

## 2018-07-30 ENCOUNTER — Other Ambulatory Visit: Payer: Self-pay

## 2018-07-30 ENCOUNTER — Ambulatory Visit: Payer: PPO | Admitting: Podiatry

## 2018-07-30 VITALS — BP 131/62 | HR 85

## 2018-07-30 DIAGNOSIS — B353 Tinea pedis: Secondary | ICD-10-CM | POA: Diagnosis not present

## 2018-07-30 DIAGNOSIS — E1142 Type 2 diabetes mellitus with diabetic polyneuropathy: Secondary | ICD-10-CM

## 2018-07-30 DIAGNOSIS — N390 Urinary tract infection, site not specified: Secondary | ICD-10-CM | POA: Diagnosis not present

## 2018-07-30 DIAGNOSIS — B351 Tinea unguium: Secondary | ICD-10-CM

## 2018-07-30 DIAGNOSIS — M79674 Pain in right toe(s): Secondary | ICD-10-CM | POA: Diagnosis not present

## 2018-07-30 DIAGNOSIS — M79675 Pain in left toe(s): Secondary | ICD-10-CM

## 2018-07-30 MED ORDER — NYSTATIN-TRIAMCINOLONE 100000-0.1 UNIT/GM-% EX OINT: 1.0000 "application " | TOPICAL_OINTMENT | Freq: Two times a day (BID) | CUTANEOUS | 2 refills | Status: DC

## 2018-07-30 NOTE — Patient Outreach (Signed)
[facility]) Care Management  07/30/2018  Rick Cruz 10-15-1945 [id number]  Office notes received from [facility] and scanned into chart.    Of note:  Lantus dose was increased to 45 units/day.  Novolog dose was decreased to 15 units TID before meals.  Patient was given a correctional scale where he can use up to 80 units/day.    Endocrinologist thinks a SGLT2 like Wilder Glade may improve diabetes, while providing a cardiovascular benefit.  She is going to collaborate with his cardiologist before adding.   He is waiting on his new insulin pump to arrive.   Plan: Outreach to Dr. Festus Holts office to get a copy of the correctional scale she gave to [name].   [name], PharmD Clinical Pharmacist [place] [phone]  Addendum: Incoming call from [facility] at Dr. Festus Holts office.  She states that the correctional scale was in his Tracker book and she doesn't have a copy to send me.  She report that Dr. Garnet Koyanagi is going to change to Novolog 15 units TID ac with no correction scale.  She reports that she will communicate that with the patient tomorrow.  She states that she will outreach to cardiology to see if he approves adding [place].    Plan: Await call back about Farxiga addition.  [name], PharmD Clinical Pharmacist [place] [phone]

## 2018-07-30 NOTE — Patient Instructions (Addendum)
Athlete's Foot Athlete's foot (tinea pedis) is a fungal infection of the skin on the feet. It often occurs on the skin that is between or underneath the toes. It can also occur on the soles of the feet. The infection can spread from person to person (is contagious). What are the causes? Athlete's foot is caused by a fungus. This fungus grows in warm, moist places. Most people get athlete's foot by sharing shower stalls, towels, and wet floors with someone who is infected. Not washing your feet or changing your socks often enough can contribute to athlete's foot. What increases the risk? This condition is more likely to develop in:  Men.  People who have a weak body defense system (immune system).  People who have diabetes.  People who use public showers, such as at a gym.  People who wear heavy-duty shoes, such as Environmental manager.  Seasons with warm, humid weather.  What are the signs or symptoms? Symptoms of this condition include:  Itchy areas between the toes or on the soles of the feet.  White, flaky, or scaly areas between the toes or on the soles of the feet.  Very itchy small blisters between the toes or on the soles of the feet.  Small cuts on the skin. These cuts can become infected.  Thick or discolored toenails.  How is this diagnosed? This condition is diagnosed with a medical history and physical exam. Your health care provider may also take a skin or toenail sample to be examined. How is this treated? Treatment for this condition includes antifungal medicines. These may be applied as powders, ointments, or creams. In severe cases, an oral antifungal medicine may be given. Follow these instructions at home:  Apply or take over-the-counter and prescription medicines only as told by your health care provider.  Keep all follow-up visits as told by your health care provider. This is important.  Do not scratch your feet.  Keep your feet dry: ? Wear  cotton or wool socks. Change your socks every day or if they become wet. ? Wear shoes that allow air to circulate, such as sandals or canvas tennis shoes.  Wash and dry your feet: ? Every day or as told by your health care provider. ? After exercising. ? Including the area between your toes.  Do not share towels, nail clippers, or other personal items that touch your feet with others.  If you have diabetes, keep your blood sugar under control. How is this prevented?  Do not share towels.  Wear sandals in wet areas, such as locker rooms and shared showers.  Keep your feet dry: ? Wear cotton or wool socks. Change your socks every day or if they become wet. ? Wear shoes that allow air to circulate, such as sandals or canvas tennis shoes.  Wash and dry your feet after exercising. Pay attention to the area between your toes. Contact a health care provider if:  You have a fever.  You have swelling, soreness, warmth, or redness in your foot.  You are not getting better with treatment.  Your symptoms get worse.  You have new symptoms. This information is not intended to replace advice given to you by your health care provider. Make sure you discuss any questions you have with your health care provider. Document Released: 08/31/2000 Document Revised: 02/09/2016 Document Reviewed: 03/07/2015 Elsevier Interactive Patient Education  2018 [employer].  Diabetes and Foot Care Diabetes may cause you to have problems because of  poor blood supply (circulation) to your feet and legs. This may cause the skin on your feet to become thinner, break easier, and heal more slowly. Your skin may become dry, and the skin may peel and crack. You may also have nerve damage in your legs and feet causing decreased feeling in them. You may not notice minor injuries to your feet that could lead to infections or more serious problems. Taking care of your feet is one of the most important things you can do for  yourself. Follow these instructions at home:  Wear shoes at all times, even in the house. Do not go barefoot. Bare feet are easily injured.  Check your feet daily for blisters, cuts, and redness. If you cannot see the bottom of your feet, use a mirror or ask someone for help.  Wash your feet with warm water (do not use hot water) and mild soap. Then pat your feet and the areas between your toes until they are completely dry. Do not soak your feet as this can dry your skin.  Apply a moisturizing lotion or petroleum jelly (that does not contain alcohol and is unscented) to the skin on your feet and to dry, brittle toenails. Do not apply lotion between your toes.  Trim your toenails straight across. Do not dig under them or around the cuticle. File the edges of your nails with an emery board or nail file.  Do not cut corns or calluses or try to remove them with medicine.  Wear clean socks or stockings every day. Make sure they are not too tight. Do not wear knee-high stockings since they may decrease blood flow to your legs.  Wear shoes that fit properly and have enough cushioning. To break in new shoes, wear them for just a few hours a day. This prevents you from injuring your feet. Always look in your shoes before you put them on to be sure there are no objects inside.  Do not cross your legs. This may decrease the blood flow to your feet.  If you find a minor scrape, cut, or break in the skin on your feet, keep it and the skin around it clean and dry. These areas may be cleansed with mild soap and water. Do not cleanse the area with peroxide, alcohol, or iodine.  When you remove an adhesive bandage, be sure not to damage the skin around it.  If you have a wound, look at it several times a day to make sure it is healing.  Do not use heating pads or hot water bottles. They may burn your skin. If you have lost feeling in your feet or legs, you may not know it is happening until it is too  late.  Make sure your health care provider performs a complete foot exam at least annually or more often if you have foot problems. Report any cuts, sores, or bruises to your health care provider immediately. Contact a health care provider if:  You have an injury that is not healing.  You have cuts or breaks in the skin.  You have an ingrown nail.  You notice redness on your legs or feet.  You feel burning or tingling in your legs or feet.  You have pain or cramps in your legs and feet.  Your legs or feet are numb.  Your feet always feel cold. Get help right away if:  There is increasing redness, swelling, or pain in or around a wound.  There is  a red line that goes up your leg.  Pus is coming from a wound.  You develop a fever or as directed by your health care provider.  You notice a bad smell coming from an ulcer or wound. This information is not intended to replace advice given to you by your health care provider. Make sure you discuss any questions you have with your health care provider. Document Released: 08/31/2000 Document Revised: 02/09/2016 Document Reviewed: 02/10/2013 Elsevier Interactive Patient Education  2017 [employer]  Diabetic Neuropathy Diabetic neuropathy is a nerve disease or nerve damage that is caused by diabetes mellitus. About half of all people with diabetes mellitus have some form of nerve damage. Nerve damage is more common in those who have had diabetes mellitus for many years and who generally have not had good control of their blood sugar (glucose) level. Diabetic neuropathy is a common complication of diabetes mellitus. There are three common types of diabetic neuropathy and a fourth type that is less common and less understood:  Peripheral neuropathy-This is the most common type of diabetic neuropathy. It causes damage to the nerves of the feet and legs first and then eventually the hands and arms. The damage affects the ability to sense  touch.  Autonomic neuropathy-This type causes damage to the autonomic nervous system, which controls the following functions: ? Heartbeat. ? Body temperature. ? Blood pressure. ? Urination. ? Digestion. ? Sweating. ? Sexual function.  Focal neuropathy-Focal neuropathy can be painful and unpredictable and occurs most often in older adults with diabetes mellitus. It involves a specific nerve or one area and often comes on suddenly. It usually does not cause long-term problems.  Radiculoplexus neuropathy- Sometimes called lumbosacral radiculoplexus neuropathy, radiculoplexus neuropathy affects the nerves of the thighs, hips, buttocks, or legs. It is more common in people with type 2 diabetes mellitus and in older men. It is characterized by debilitating pain, weakness, and atrophy, usually in the thigh muscles.  What are the causes? The cause of peripheral, autonomic, and focal neuropathies is diabetes mellitus that is uncontrolled and high glucose levels. The cause of radiculoplexus neuropathy is unknown. However, it is thought to be caused by inflammation related to uncontrolled glucose levels. What are the signs or symptoms? Peripheral Neuropathy Peripheral neuropathy develops slowly over time. When the nerves of the feet and legs no longer work there may be:  Burning, stabbing, or aching pain in the legs or feet.  Inability to feel pressure or pain in your feet. This can lead to: ? Thick calluses over pressure areas. ? Pressure sores. ? Ulcers.  Foot deformities.  Reduced ability to feel temperature changes.  Muscle weakness.  Autonomic Neuropathy The symptoms of autonomic neuropathy vary depending on which nerves are affected. Symptoms may include:  Problems with digestion, such as: ? Feeling sick to your stomach (nausea). ? Vomiting. ? Bloating. ? Constipation. ? Diarrhea. ? Abdominal pain.  Difficulty with urination. This occurs if you lose your ability to sense when  your bladder is full. Problems include: ? Urine leakage (incontinence). ? Inability to empty your bladder completely (retention).  Rapid or irregular heartbeat (palpitations).  Blood pressure drops when you stand up (orthostatic hypotension). When you stand up you may feel: ? Dizzy. ? Weak. ? Faint.  In men, inability to attain and maintain an erection.  In women, vaginal dryness and problems with decreased sexual desire and arousal.  Problems with body temperature regulation.  Increased or decreased sweating.  Focal Neuropathy  Abnormal eye  movements or abnormal alignment of both eyes.  Weakness in the wrist.  Foot drop. This results in an inability to lift the foot properly and abnormal walking or foot movement.  Paralysis on one side of your face (Bell palsy).  Chest or abdominal pain. Radiculoplexus Neuropathy  Sudden, severe pain in your hip, thigh, or buttocks.  Weakness and wasting of thigh muscles.  Difficulty rising from a seated position.  Abdominal swelling.  Unexplained weight loss (usually more than 10 lb [4.5 kg]). How is this diagnosed? Peripheral Neuropathy Your senses may be tested. Sensory function testing can be done with:  A light touch using a monofilament.  A vibration with tuning fork.  A sharp sensation with a pin prick.  Other tests that can help diagnose neuropathy are:  Nerve conduction velocity. This test checks the transmission of an electrical current through a nerve.  Electromyography. This shows how muscles respond to electrical signals transmitted by nearby nerves.  Quantitative sensory testing. This is used to assess how your nerves respond to vibrations and changes in temperature.  Autonomic Neuropathy Diagnosis is often based on reported symptoms. Tell your health care provider if you experience:  Dizziness.  Constipation.  Diarrhea.  Inappropriate urination or inability to urinate.  Inability to get or maintain  an erection.  Tests that may be done include:  Electrocardiography or Holter monitor. These are tests that can help show problems with the heart rate or heart rhythm.  An X-ray exam may be done.  Focal Neuropathy Diagnosis is made based on your symptoms and what your health care provider finds during your exam. Other tests may be done. They may include:  Nerve conduction velocities. This checks the transmission of electrical current through a nerve.  Electromyography. This shows how muscles respond to electrical signals transmitted by nearby nerves.  Quantitative sensory testing. This test is used to assess how your nerves respond to vibration and changes in temperature.  Radiculoplexus Neuropathy  Often the first thing is to eliminate any other issue or problems that might be the cause, as there is no standard test for diagnosis.  X-ray exam of your spine and lumbar region.  Spinal tap to rule out cancer.  MRI to rule out other lesions. How is this treated? Once nerve damage occurs, it cannot be reversed. The goal of treatment is to keep the disease or nerve damage from getting worse and affecting more nerve fibers. Controlling your blood glucose level is the key. Most people with radiculoplexus neuropathy see at least a partial improvement over time. You will need to keep your blood glucose and HbA1c levels in the target range determined by your health care provider. Things that help control blood glucose levels include:  Blood glucose monitoring.  Meal planning.  Physical activity.  Diabetes medicine.  Over time, maintaining lower blood glucose levels helps lessen symptoms. Sometimes, prescription pain medicine is needed. Follow these instructions at home:  Do not smoke.  Keep your blood glucose level in the range that you and your health care provider have determined acceptable for you.  Keep your blood pressure level in the range that you and your health care provider  have determined acceptable for you.  Eat a well-balanced diet.  Be physically active every day. Include strength training and balance exercises.  Protect your feet. ? Check your feet every day for sores, cuts, blisters, or signs of infection. ? Wear padded socks and supportive shoes. Use orthotic inserts, if necessary. ? Regularly check the  insides of your shoes for worn spots. Make sure there are no rocks or other items inside your shoes before you put them on. Contact a health care provider if:  You have burning, stabbing, or aching pain in the legs or feet.  You are unable to feel pressure or pain in your feet.  You develop problems with digestion such as: ? Nausea. ? Vomiting. ? Bloating. ? Constipation. ? Diarrhea. ? Abdominal pain.  You have difficulty with urination, such as: ? Incontinence. ? Retention.  You have palpitations.  You develop orthostatic hypotension. When you stand up you may feel: ? Dizzy. ? Weak. ? Faint.  You cannot attain and maintain an erection (in men).  You have vaginal dryness and problems with decreased sexual desire and arousal (in women).  You have severe pain in your thighs, legs, or buttocks.  You have unexplained weight loss. This information is not intended to replace advice given to you by your health care provider. Make sure you discuss any questions you have with your health care provider. Document Released: 11/12/2001 Document Revised: 02/09/2016 Document Reviewed: 02/12/2013 Elsevier Interactive Patient Education  2017 [employer].

## 2018-08-01 ENCOUNTER — Other Ambulatory Visit: Payer: Self-pay | Admitting: *Deleted

## 2018-08-01 DIAGNOSIS — J449 Chronic obstructive pulmonary disease, unspecified: Secondary | ICD-10-CM | POA: Diagnosis not present

## 2018-08-04 ENCOUNTER — Other Ambulatory Visit: Payer: Self-pay

## 2018-08-04 NOTE — Patient Outreach (Signed)
[facility]) Care Management  08/04/2018  ETAN VASUDEVAN [birth year] [id number]  Successful outreach call to Mr. Finch and his wife.  HIPAA identifiers verified.   Mr. Ruben was questioning if I thought it would be a good idea for him to see if he could get a home aide to assist them a few days/week.  He states he has some information from his insurance about home health and he is going to contact some local agencies.  He declined a [facility] social work referral.     Mr. Berkheimer states that he has not received his insulin pump.  He reports that he is following the Novolog sliding scale that was given to him by Dr. Garnet Koyanagi (see below) and that his CGM readings are <200. Wife reports that he has multiple doctor's appointments scheduled, but she will need to look for physicians in [place], [place], as they hope to move back there in early 2020.   She reports that Mr. Hollenbach will see a nutritionist on 12/2 and Dr. Garnet Koyanagi on 12/23.  Both Mr. & Mrs. Macphee sound in better spirits today.   Novolog SSI tid ac <149 = 15 units 150-180= 17 units 181-210= 19 nits 211-240=21 units 241-270=23 units >271 = 25 units  Plan:  Outreach to Mr. Funke in 2 weeks.  [name], PharmD Clinical Pharmacist [place] [phone]

## 2018-08-06 ENCOUNTER — Encounter: Payer: Self-pay | Admitting: *Deleted

## 2018-08-06 ENCOUNTER — Other Ambulatory Visit: Payer: Self-pay | Admitting: *Deleted

## 2018-08-06 NOTE — Patient Outreach (Signed)
Second attempt to follow up with pt on his diabetes self management. No answer at either phone listed for him. I will send an unsuccessful letter.  Eulah Pont. Myrtie Neither, MSN, [facility] Gerontological Nurse Practitioner [facility] Care Management [phone]

## 2018-08-06 NOTE — Patient Outreach (Signed)
Called pt to follow up but no answer and pt did not return my call as requested.  I will try again next week.  Eulah Pont. Myrtie Neither, MSN, [facility] Gerontological Nurse Practitioner [facility] Care Management [phone]

## 2018-08-07 ENCOUNTER — Ambulatory Visit: Payer: Self-pay

## 2018-08-11 DIAGNOSIS — E119 Type 2 diabetes mellitus without complications: Secondary | ICD-10-CM | POA: Diagnosis not present

## 2018-08-18 ENCOUNTER — Ambulatory Visit: Payer: Self-pay | Admitting: Registered"

## 2018-08-18 ENCOUNTER — Other Ambulatory Visit: Payer: Self-pay

## 2018-08-18 ENCOUNTER — Ambulatory Visit: Payer: Self-pay

## 2018-08-18 NOTE — Patient Outreach (Signed)
[facility]) Care Management  [place]  08/18/2018  KIT MOLLETT [birth year] [id number]  62year oldmaleoutreached by [facility] services for a 30 day post discharge medication review. PMHx includes, but not limited to, hypertension, coronary artery disease, atrial fibrillation, erosive esophagitis, type 2 diabetes mellitus and dyslipidemia.  Unsuccessful telephone call attempt # 1 to Mr. Luhn. HIPAA compliant voicemail left requesting a return call.  Plan:  I will make another outreach attempt to patient within 3-4 business days.  [name], PharmD Clinical Pharmacist [place] [phone]

## 2018-08-20 ENCOUNTER — Other Ambulatory Visit: Payer: Self-pay | Admitting: *Deleted

## 2018-08-20 NOTE — Patient Outreach (Signed)
I have made my final telephone attempt to reach Rick Cruz after sending him a letter stating that I was not able to reach him and I would like a call back, however, I did not get a call.  I am closing his case due to inability to contact him after many calls and a letter.  Eulah Pont. Myrtie Neither, MSN, [facility] Gerontological Nurse Practitioner [facility] Care Management [phone]

## 2018-08-21 ENCOUNTER — Other Ambulatory Visit: Payer: Self-pay

## 2018-08-21 ENCOUNTER — Ambulatory Visit: Payer: Self-pay

## 2018-08-21 NOTE — Patient Outreach (Addendum)
[facility]) Care Management  [place]  08/21/2018  DIMAS SCHECK 1945/10/18 [id number]  Unsuccessful telephone call attempt # 2 to Mr. Sperbeck.  I left a HIPAA compliant voice message on the home phone.  I as unable to leave a message on Mrs. Mclin' cell phone because the voice mail is full.   Noted that [facility] NP, has closed his case for inability to maintain contact.   Plan:  I will make another outreach attempt to patient within 3-4 business days.  [name], PharmD Clinical Pharmacist [place] [phone]  Addendum:  Incoming call received from Mr. Innes. HIPAA identifiers verified.   Mr. Fullenwider states that they are getting ready to go back to [place] either today or tomorrow for 2 weeks. He states they will be back for his endocrinologist's appointment on 12/18. He reports that he received his insulin pump yesterday and that he meets with the [employer] rep on 12/18 for education and to hook it up.    He states that his glucose has been high "around 200 or a little above."  He states he is trying to take his insulin as prescribed.   He reports that he had a glucose of 54 a couple of days ago. When questioned he states that he took  insulin without eating.  Counseled him again about the dangers of low glucose and about taking his Novolog only when he has food right in front of him ready to eat.  He verbalized understanding.    Informed him that [facility] NP, Deloria Lair has been trying to reach them.  He states they have not gotten her messages.  I gave them her phone number and requested that they call her on Monday once they are back in [place].  He stated that he would.    Plan: Outreach to Mr. Ermis after his endocrine appointment on 12/18.  Inform [facility] NP, that he states he will contact her on Monday.  [name], PharmD Clinical Pharmacist [place] [phone]

## 2018-08-24 ENCOUNTER — Encounter: Payer: Self-pay | Admitting: Podiatry
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
## 2018-08-26 ENCOUNTER — Encounter: Payer: Self-pay | Admitting: *Deleted

## 2018-08-27 ENCOUNTER — Ambulatory Visit: Payer: Self-pay

## 2018-08-31 DIAGNOSIS — J449 Chronic obstructive pulmonary disease, unspecified: Secondary | ICD-10-CM | POA: Diagnosis not present

## 2018-09-03 ENCOUNTER — Ambulatory Visit: Payer: Self-pay | Admitting: Registered"

## 2018-09-04 ENCOUNTER — Ambulatory Visit: Payer: Self-pay | Admitting: Nutrition

## 2018-09-04 ENCOUNTER — Other Ambulatory Visit: Payer: Self-pay

## 2018-09-04 ENCOUNTER — Ambulatory Visit: Payer: Self-pay

## 2018-09-04 NOTE — Patient Outreach (Signed)
[facility]) Care Management  09/04/2018  HERNY SCURLOCK Jul 11, 1946 [id number]  Successful outreach call to Ms. Starner' wife, [name]  Subjective: Mrs. Paxton states that they are still in [place] with her family that lives in [place].  She states that the patient fell a couple of nights ago going up the stairs at her mother's house.  Wife states that he is sore, but did not get hurt.   Wife states that they had to call 911 last night at 2 am because Mr. Cail was having uncontrollable shaking and his hand was drawn.  EMT checked his glucose and it was at least 500 mg/dL, which is the highest their machine reads.   They gave him insulin and wanted to take him to the hospital, but he refused.  Wife states that Mr. Avetisyan said that he ate dinner and fell asleep before he took his insulin.  Wife reports that they had several appointments in [place] today including meeting with the [employer] representative to set up his insulin pump that they had to cancel.  Wife states Mr. Yeargan is switching to [facility] in 2020, but will keep their local physicians.  She states they plan to come back to town before Christmas to spend with his mother and he has an appointment with Dr. Garnet Koyanagi on 12/26.   Plan: Follow up with Mr. Reppert in 2 weeks to see if he has questions after his endocrinologist's appointment and if he has his insulin pump set up.  [name], PharmD Clinical Pharmacist [place] [phone]

## 2018-09-10 DIAGNOSIS — E119 Type 2 diabetes mellitus without complications: Secondary | ICD-10-CM | POA: Diagnosis not present

## 2018-09-16 DIAGNOSIS — J449 Chronic obstructive pulmonary disease, unspecified: Secondary | ICD-10-CM | POA: Diagnosis not present

## 2018-09-19 ENCOUNTER — Other Ambulatory Visit: Payer: Self-pay

## 2018-09-19 ENCOUNTER — Ambulatory Visit: Payer: Self-pay

## 2018-09-19 NOTE — Patient Outreach (Signed)
[facility] Care Management  [place]  09/19/2018  HYUN MARSALIS [birth year] [id number]  Reason for call: medication management follow up  Unsuccessful telephone call attempt # 1 to patient.   HIPAA compliant voicemail left requesting a return call.  Plan:  I will make another outreach attempt to patient within 3-4 business days.  [name], PharmD Clinical Pharmacist [place] [phone]

## 2018-09-24 ENCOUNTER — Other Ambulatory Visit: Payer: Self-pay

## 2018-09-24 NOTE — Patient Outreach (Addendum)
[facility]) Care Management  [place]  09/24/2018  Rick Cruz 25-Apr-1946 [id number]   Reason for call: Medication management   Unsuccessful telephone call attempt #2 to patient.   HIPAA compliant voicemail left requesting a return call.  Plan:  I will make another outreach attempt to patient within 3-4 business days.  Denver Faster PharmD Candidate Class of 2020  [employer], [place] [phone]

## 2018-09-29 ENCOUNTER — Ambulatory Visit: Payer: Self-pay

## 2018-09-29 ENCOUNTER — Other Ambulatory Visit: Payer: Self-pay

## 2018-09-29 NOTE — Patient Outreach (Signed)
[facility]) Care Management  [place]  09/29/2018  FRIEDRICH HARRIOTT Feb 16, 1946 [id number]  Reason for call: medication management  Unsuccessful telephone call attempt # 3 to patient.   HIPAA compliant voicemail left requesting a return call on home phone.  Wife's cell number is not working anymore.   Plan:  I will follow-up on 10th business day from first unsuccessful outreach attempt to maintain contact with patient. If no response from patient at this time, I will close [facility] case.   [name], PharmD Clinical Pharmacist [place] [phone]

## 2018-10-03 ENCOUNTER — Other Ambulatory Visit: Payer: Self-pay

## 2018-10-03 ENCOUNTER — Ambulatory Visit: Payer: Self-pay
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
## 2018-10-28 ENCOUNTER — Ambulatory Visit: Payer: Self-pay

## 2018-10-28 ENCOUNTER — Other Ambulatory Visit: Payer: Self-pay

## 2018-10-28 NOTE — Patient Outreach (Addendum)
[facility]) Care Management [place]  10/28/2018  Rick Cruz [birth year] [id number]  Reason for referral: medication management  Medication Management: Rick Cruz states that he saw Dr. Nehemiah Settle yesterday and she adjusted his insulin pump because his glucose has been running in the 200-300 mg/dL range.  He states that he has not had anymore hypoglycemia episodes.  He reports he will see her again in 5 weeks. They plan to go back to [place], [place] soon.  [facility] pharmacy case is being closed due to the following reasons:  The patient is no longer eligible for the [facility] program (ineligible insurance or provider). Goals have been met.  Patient has been provided [name] CM contact information if assistance needed in the future.    Thank you for allowing [facility] pharmacy to be involved in this patient's care.   [name], PharmD [place] [phone]

## 2018-10-30 ENCOUNTER — Ambulatory Visit: Payer: Self-pay

## 2018-10-30 ENCOUNTER — Ambulatory Visit: Payer: Medicare Other | Admitting: Podiatry

## 2018-11-21 ENCOUNTER — Emergency Department (HOSPITAL_COMMUNITY): Payer: Medicare Other

## 2018-11-21 ENCOUNTER — Encounter (HOSPITAL_COMMUNITY): Payer: Self-pay | Admitting: Emergency Medicine

## 2018-11-21 ENCOUNTER — Observation Stay (HOSPITAL_COMMUNITY)
Admission: EM | Admit: 2018-11-21 | Discharge: 2018-11-25 | Disposition: A | Discharge status: Home / Self Care | Payer: Medicare Other | Attending: Internal Medicine | Admitting: Internal Medicine

## 2018-11-21 ENCOUNTER — Other Ambulatory Visit: Payer: Self-pay

## 2018-11-21 DIAGNOSIS — Z9114 Patient's other noncompliance with medication regimen: Secondary | ICD-10-CM | POA: Insufficient documentation

## 2018-11-21 DIAGNOSIS — I110 Hypertensive heart disease with heart failure: Secondary | ICD-10-CM | POA: Diagnosis not present

## 2018-11-21 DIAGNOSIS — Z794 Long term (current) use of insulin: Secondary | ICD-10-CM | POA: Diagnosis not present

## 2018-11-21 DIAGNOSIS — Z79899 Other long term (current) drug therapy: Secondary | ICD-10-CM | POA: Diagnosis not present

## 2018-11-21 DIAGNOSIS — R739 Hyperglycemia, unspecified: Secondary | ICD-10-CM

## 2018-11-21 DIAGNOSIS — G4733 Obstructive sleep apnea (adult) (pediatric): Secondary | ICD-10-CM | POA: Insufficient documentation

## 2018-11-21 DIAGNOSIS — I5032 Chronic diastolic (congestive) heart failure: Secondary | ICD-10-CM | POA: Insufficient documentation

## 2018-11-21 DIAGNOSIS — R4182 Altered mental status, unspecified: Principal | ICD-10-CM

## 2018-11-21 DIAGNOSIS — E1165 Type 2 diabetes mellitus with hyperglycemia: Secondary | ICD-10-CM | POA: Insufficient documentation

## 2018-11-21 DIAGNOSIS — I10 Essential (primary) hypertension: Secondary | ICD-10-CM | POA: Diagnosis present

## 2018-11-21 DIAGNOSIS — R000 Tachycardia, unspecified: Secondary | ICD-10-CM | POA: Diagnosis not present

## 2018-11-21 DIAGNOSIS — R4020 Unspecified coma: Secondary | ICD-10-CM | POA: Diagnosis not present

## 2018-11-21 DIAGNOSIS — R2681 Unsteadiness on feet: Secondary | ICD-10-CM | POA: Diagnosis not present

## 2018-11-21 DIAGNOSIS — I480 Paroxysmal atrial fibrillation: Secondary | ICD-10-CM | POA: Diagnosis not present

## 2018-11-21 DIAGNOSIS — F200 Paranoid schizophrenia: Secondary | ICD-10-CM | POA: Diagnosis not present

## 2018-11-21 DIAGNOSIS — Z87891 Personal history of nicotine dependence: Secondary | ICD-10-CM | POA: Insufficient documentation

## 2018-11-21 DIAGNOSIS — E119 Type 2 diabetes mellitus without complications: Secondary | ICD-10-CM

## 2018-11-21 LAB — COMPREHENSIVE METABOLIC PANEL
ALBUMIN: 3.6 g/dL (ref 3.5–5.0)
ALT: 16 U/L (ref 0–44)
AST: 18 U/L (ref 15–41)
Alkaline Phosphatase: 110 U/L (ref 38–126)
Anion gap: 12 (ref 5–15)
BILIRUBIN TOTAL: 0.8 mg/dL (ref 0.3–1.2)
BUN: 19 mg/dL (ref 8–23)
CO2: 29 mmol/L (ref 22–32)
Calcium: 8.1 mg/dL — ABNORMAL LOW (ref 8.9–10.3)
Chloride: 91 mmol/L — ABNORMAL LOW (ref 98–111)
Creatinine, Ser: 1.28 mg/dL — ABNORMAL HIGH (ref 0.61–1.24)
GFR calc Af Amer: >60 mL/min (ref >60)
GFR calc non Af Amer: 56 mL/min — ABNORMAL LOW (ref >60)
Glucose, Bld: 500 mg/dL — ABNORMAL HIGH (ref 70–99)
POTASSIUM: 4.5 mmol/L (ref 3.5–5.1)
Sodium: 132 mmol/L — ABNORMAL LOW (ref 135–145)
Total Protein: 6.7 g/dL (ref 6.5–8.1)

## 2018-11-21 LAB — RAPID URINE DRUG SCREEN, HOSP PERFORMED
Amphetamines: NOT DETECTED
Barbiturates: NOT DETECTED
Benzodiazepines: NOT DETECTED
Cocaine: NOT DETECTED
OPIATES: NOT DETECTED
Tetrahydrocannabinol: NOT DETECTED

## 2018-11-21 LAB — CBC WITH DIFFERENTIAL/PLATELET
ABS IMMATURE GRANULOCYTES: 0.09 10*3/uL — AB (ref 0.00–0.07)
Basophils Absolute: 0 10*3/uL (ref 0.0–0.1)
Basophils Relative: 0 %
Eosinophils Absolute: 0 10*3/uL (ref 0.0–0.5)
Eosinophils Relative: 0 %
HCT: 39.9 % (ref 39.0–52.0)
Hemoglobin: 12 g/dL — ABNORMAL LOW (ref 13.0–17.0)
Immature Granulocytes: 1 %
LYMPHS PCT: 4 %
Lymphs Abs: 0.6 10*3/uL — ABNORMAL LOW (ref 0.7–4.0)
MCH: 25.5 pg — ABNORMAL LOW (ref 26.0–34.0)
MCHC: 30.1 g/dL (ref 30.0–36.0)
MCV: 84.7 fL (ref 80.0–100.0)
MONO ABS: 0.7 10*3/uL (ref 0.1–1.0)
Monocytes Relative: 5 %
Neutro Abs: 12.6 10*3/uL — ABNORMAL HIGH (ref 1.7–7.7)
Neutrophils Relative %: 90 %
Platelets: 230 10*3/uL (ref 150–400)
RBC: 4.71 MIL/uL (ref 4.22–5.81)
RDW: 14.4 % (ref 11.5–15.5)
WBC: 14 10*3/uL — AB (ref 4.0–10.5)
nRBC: 0 % (ref 0.0–0.2)

## 2018-11-21 LAB — URINALYSIS, ROUTINE W REFLEX MICROSCOPIC
BACTERIA UA: NONE SEEN
Bilirubin Urine: NEGATIVE
Glucose, UA: >=500 mg/dL — AB
Hgb urine dipstick: NEGATIVE
Ketones, ur: 20 mg/dL — AB
Leukocytes,Ua: NEGATIVE
Nitrite: NEGATIVE
Protein, ur: NEGATIVE mg/dL
Specific Gravity, Urine: 1.024 (ref 1.005–1.030)
pH: 5 (ref 5.0–8.0)

## 2018-11-21 LAB — ETHANOL: Alcohol, Ethyl (B): <10 mg/dL (ref <10)

## 2018-11-21 LAB — BRAIN NATRIURETIC PEPTIDE: B Natriuretic Peptide: 29 pg/mL (ref 0.0–100.0)

## 2018-11-21 LAB — CBG MONITORING, ED
GLUCOSE-CAPILLARY: 459 mg/dL — AB (ref 70–99)
GLUCOSE-CAPILLARY: 159 mg/dL — AB (ref 70–99)
Glucose-Capillary: 596 mg/dL (ref 70–99)
Glucose-Capillary: 495 mg/dL — ABNORMAL HIGH (ref 70–99)
Glucose-Capillary: 463 mg/dL — ABNORMAL HIGH (ref 70–99)
Glucose-Capillary: 198 mg/dL — ABNORMAL HIGH (ref 70–99)

## 2018-11-21 LAB — TROPONIN I: Troponin I: <0.03 ng/mL (ref <0.03)

## 2018-11-21 LAB — LACTIC ACID, PLASMA: Lactic Acid, Venous: 1.8 mmol/L (ref 0.5–1.9)

## 2018-11-21 MED ORDER — INSULIN ASPART 100 UNIT/ML ~~LOC~~ SOLN
0.0000 [IU] | SUBCUTANEOUS | Status: DC
  Administered 2018-11-22: 11 [IU] via SUBCUTANEOUS
  Filled 2018-11-21: qty 1

## 2018-11-21 MED ORDER — ZIPRASIDONE MESYLATE 20 MG IM SOLR
10.0000 mg | Freq: Once | INTRAMUSCULAR | Status: AC
  Administered 2018-11-21: 10 mg via INTRAMUSCULAR
  Filled 2018-11-21: qty 20

## 2018-11-21 MED ORDER — SIMVASTATIN 20 MG PO TABS
40.0000 mg | ORAL_TABLET | Freq: Every evening | ORAL | Status: DC
  Administered 2018-11-21 – 2018-11-24 (×4): 40 mg via ORAL
  Filled 2018-11-21 (×2): qty 2
  Filled 2018-11-21: qty 4
  Filled 2018-11-21 (×2): qty 2

## 2018-11-21 MED ORDER — FLUTICASONE FUROATE-VILANTEROL 100-25 MCG/INH IN AEPB
1.0000 | INHALATION_SPRAY | Freq: Every day | RESPIRATORY_TRACT | Status: DC
  Administered 2018-11-23 – 2018-11-25 (×3): 1 via RESPIRATORY_TRACT
  Filled 2018-11-21: qty 28

## 2018-11-21 MED ORDER — SODIUM CHLORIDE 0.9 % IV SOLN: INTRAVENOUS | Status: DC

## 2018-11-21 MED ORDER — INSULIN GLARGINE 100 UNIT/ML ~~LOC~~ SOLN
30.0000 [IU] | Freq: Every day | SUBCUTANEOUS | Status: DC
  Administered 2018-11-21: 30 [IU] via SUBCUTANEOUS
  Filled 2018-11-21 (×3): qty 0.3

## 2018-11-21 MED ORDER — STERILE WATER FOR INJECTION IJ SOLN
INTRAMUSCULAR | Status: AC
  Filled 2018-11-21: qty 10

## 2018-11-21 MED ORDER — UMECLIDINIUM BROMIDE 62.5 MCG/INH IN AEPB
1.0000 | INHALATION_SPRAY | Freq: Every day | RESPIRATORY_TRACT | Status: DC
  Administered 2018-11-23 – 2018-11-25 (×3): 1 via RESPIRATORY_TRACT
  Filled 2018-11-21: qty 7

## 2018-11-21 MED ORDER — ACETAMINOPHEN 650 MG RE SUPP: 650.0000 mg | Freq: Four times a day (QID) | RECTAL | Status: DC | PRN

## 2018-11-21 MED ORDER — INSULIN REGULAR(HUMAN) IN NACL 100-0.9 UT/100ML-% IV SOLN
INTRAVENOUS | Status: DC
  Administered 2018-11-21: 4.4 [IU]/h via INTRAVENOUS
  Filled 2018-11-21: qty 100

## 2018-11-21 MED ORDER — PANTOPRAZOLE SODIUM 40 MG PO TBEC
40.0000 mg | DELAYED_RELEASE_TABLET | Freq: Two times a day (BID) | ORAL | Status: DC
  Administered 2018-11-21 – 2018-11-25 (×7): 40 mg via ORAL
  Filled 2018-11-21 (×7): qty 1

## 2018-11-21 MED ORDER — DEXTROSE-NACL 5-0.45 % IV SOLN: INTRAVENOUS | Status: DC

## 2018-11-21 MED ORDER — LORAZEPAM 2 MG/ML IJ SOLN
1.0000 mg | Freq: Once | INTRAMUSCULAR | Status: AC
  Administered 2018-11-21: 1 mg via INTRAVENOUS
  Filled 2018-11-21: qty 1

## 2018-11-21 MED ORDER — SODIUM CHLORIDE 0.9% FLUSH
3.0000 mL | Freq: Two times a day (BID) | INTRAVENOUS | Status: DC
  Administered 2018-11-21 – 2018-11-24 (×6): 3 mL via INTRAVENOUS
  Administered 2018-11-25: 09:00:00 via INTRAVENOUS

## 2018-11-21 MED ORDER — ALBUTEROL SULFATE (2.5 MG/3ML) 0.083% IN NEBU: 2.5000 mg | INHALATION_SOLUTION | Freq: Four times a day (QID) | RESPIRATORY_TRACT | Status: DC | PRN

## 2018-11-21 MED ORDER — FLUTICASONE-UMECLIDIN-VILANT 100-62.5-25 MCG/INH IN AEPB: 1.0000 | INHALATION_SPRAY | Freq: Every day | RESPIRATORY_TRACT | Status: DC

## 2018-11-21 MED ORDER — GABAPENTIN 300 MG PO CAPS: 300.0000 mg | ORAL_CAPSULE | Freq: Four times a day (QID) | ORAL | Status: DC

## 2018-11-21 MED ORDER — ACETAMINOPHEN 325 MG PO TABS
650.0000 mg | ORAL_TABLET | Freq: Four times a day (QID) | ORAL | Status: DC | PRN
  Administered 2018-11-21 – 2018-11-25 (×4): 650 mg via ORAL
  Filled 2018-11-21 (×4): qty 2

## 2018-11-21 MED ORDER — SODIUM CHLORIDE 0.9 % IV SOLN
INTRAVENOUS | Status: DC
  Administered 2018-11-21: 23:00:00 via INTRAVENOUS

## 2018-11-21 MED ORDER — CITALOPRAM HYDROBROMIDE 20 MG PO TABS
40.0000 mg | ORAL_TABLET | Freq: Every day | ORAL | Status: DC
  Administered 2018-11-23 – 2018-11-25 (×3): 40 mg via ORAL
  Filled 2018-11-21: qty 2
  Filled 2018-11-21 (×3): qty 1
  Filled 2018-11-21: qty 2
  Filled 2018-11-21: qty 1
  Filled 2018-11-21: qty 2

## 2018-11-21 MED ORDER — SODIUM CHLORIDE 0.9 % IV BOLUS
500.0000 mL | Freq: Once | INTRAVENOUS | Status: AC
  Administered 2018-11-21: 500 mL via INTRAVENOUS

## 2018-11-21 NOTE — ED Triage Notes (Signed)
Per EMS, pt from home. Wife called out for increasing confusion over the past 12 hrs. Upon EMS arrival, pt found to have malodorous urine, feces down leg, on abdomen and on back. Pt in A.Fib on monitor. EMS states CBG machine read "high." Pt confused and combative at this time.

## 2018-11-21 NOTE — ED Notes (Signed)
Pt placed in bilateral soft wrist restraints per Dr. Rogene Houston order. Pt hitting staff. Pt pulling at leads and removing nasal cannula.

## 2018-11-21 NOTE — H&P (Signed)
History and Physical    Rick Cruz ZTI:[id number] DOB: March 28, 1946 DOA: 11/21/2018  PCP: [name], MD  Patient coming from: Home  I have personally briefly reviewed patient's old medical records in [place]  Chief Complaint: AMS  HPI: Rick Cruz is a 73 y.o. male with medical history significant for COPD, chronic diastolic CHF, hypertension, hyperlipidemia, insulin-dependent type 2 diabetes, OSA/OHS not on CPAP, atrial fibrillation on Eliquis, and schizophrenia presents to the hospital with altered mental status.  Patient is unable to provide significant history but answers limited simple questions.  2 of history is obtained from wife by phone, EDP, and chart review.    Per wife patient has significant change in behavior since unfortunate loss of their son in the last year.  She states patient has not taking medications properly including his insulin and is unsure when he last uses insulin.  She states that he has been ordering a lot of pharmaceutical supplements online which she is not sure.  Last night he became very agitated with increased confusion overnight.  EMS were called on prior ED documentation had malodorous urine and feces on body.  Reportedly in A. fib on monitor and CBG machine read "high."  ED Course:  In the ED, initial vitals show BP 140/115, pulse 120, RR 22, temp 99.1 Fahrenheit, SPO2 94% on 3 L O2 via [place].  Labs are notable for serum glucose 500, sodium 132, potassium 4.5, bicarb 29, BUN 19, creatinine 1.28, anion gap 12, AST 18, ALT 16, T bili 0.8, alk phos 110, troponin I <0.03, WBC 14, hemoglobin 12, platelets 230, lactic acid 1.8, urinalysis with 20 ketones, negative nitrites, negative leukocytes, no bacteria on microscopy.  Blood ethanol level <10.  Patient was noted to be agitated and combative.  He was given IV Ativan and Geodon and placed in soft restraints.  He was given 1 L normal saline and started on an insulin infusion.  CT head without  contrast was ordered.  The hospitalist service was consulted admit for further management.  Review of Systems: Limited due to altered mental status.   Past Medical History:  Diagnosis Date  . Arthritis   . 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 CHF (congestive heart failure) (HCC)    diastolic   . Chronic pain   . COPD (chronic obstructive pulmonary disease) ([place])   . Depression   . Diabetes mellitus without complication (HCC)    insulin pump  . Hypercholesterolemia   . Hypertension   . On home O2    3 liters  . Oxygen deficiency   . Schizophrenia ([place])   . Sleep apnea    noncompliant with BiPAP  . Sleep apnea   . Tobacco abuse     Past Surgical History:  Procedure Laterality Date  . COLON SURGERY  March 2010   secondary to large colon polyp, final path per discharge summary notes tubulovillous adenoma.   . COLONOSCOPY  July 2011   Dr. Benson [place]: multiple polyps, internal and external hemorrhoids, diverticulosis. tubular adenoma  . ESOPHAGOGASTRODUODENOSCOPY (EGD) WITH PROPOFOL N/A 04/20/2013   Procedure: ESOPHAGOGASTRODUODENOSCOPY (EGD) WITH PROPOFOL;  Surgeon: Daneil Dolin, MD;  Location: [facility];  Service: Endoscopy;  Laterality: N/A;  . KNEE SURGERY     X2     reports that he quit smoking about 5 years ago. His smoking use included cigarettes. He has a 50.00 pack-year smoking history. He has never used smokeless tobacco. He reports that  he does not drink alcohol or use drugs.  Allergies  Allergen Reactions  . Phenergan [Promethazine Hcl] Other (See Comments)    Becomes very confused and aggressive  . Haldol [Haloperidol Lactate] Other (See Comments)    Shaking   . Metformin And Related Diarrhea    Family History  Problem Relation Age of Onset  . Thyroid disease Mother   . Parkinson's disease Father   . Parkinson's disease Other   . Colon cancer Neg Hx      Prior to Admission medications   Medication Sig Start Date End Date Taking? Authorizing Provider    albuterol (PROVENTIL HFA;VENTOLIN HFA) 108 (90 BASE) MCG/ACT inhaler Inhale 2 puffs into the lungs every 6 (six) hours as needed for wheezing or shortness of breath. 05/11/14  Yes Black, Lezlie Octave, NP  albuterol (PROVENTIL) (2.5 MG/3ML) 0.083% nebulizer solution Take 3 mLs (2.5 mg total) by nebulization every 6 (six) hours as needed for wheezing or shortness of breath. 08/21/15  Yes Kathie Dike, MD  gabapentin (NEURONTIN) 300 MG capsule Take 300 mg by mouth 4 (four) times daily.   Yes [provider]  metFORMIN (GLUCOPHAGE-XR) 750 MG 24 hr tablet Take 750 mg by mouth daily with breakfast.   Yes [provider]  simvastatin (ZOCOR) 40 MG tablet Take 40 mg by mouth every evening.   Yes [provider]  traZODone (DESYREL) 100 MG tablet Take 100 mg by mouth at bedtime.    Yes [provider]  acetaminophen (TYLENOL) 500 MG tablet Take 1,000 mg by mouth 2 (two) times daily as needed for mild pain or headache.    [provider]  apixaban (ELIQUIS) 5 MG TABS tablet Take 1 tablet (5 mg total) by mouth 2 (two) times daily. 06/15/16   Rai, Ripudeep Raliegh Ip, MD  cholecalciferol (VITAMIN D) 1000 UNITS tablet Take 2,000 Units by mouth daily.    [provider]  CINNAMON PO Take 1,000 mg by mouth daily.     [provider]  citalopram (CELEXA) 40 MG tablet Take 40 mg by mouth daily.    [provider]  diclofenac sodium (VOLTAREN) 1 % GEL Apply 1 application topically 3 (three) times daily. For neck pain    Janie Morning, DO  Dulaglutide (TRULICITY) 2.54 YH/0.6CB SOPN Inject 0.75 mg into the skin once a week. Fridays- given 2 weeks sample to start 10/03/18 by Averneni    [provider]  Fluticasone-Umeclidin-Vilant (TRELEGY ELLIPTA) 100-62.5-25 MCG/INH AEPB Inhale 1 puff into the lungs daily. Samples given 10/01/18 by Dr. Luan Pulling    [provider]  GUAIFENESIN PO Take 2 tablets by mouth 2 (two) times daily. '400mg'$  tablets, takes 2  tablets twice daily.    [provider]  hydrOXYzine (ATARAX/VISTARIL) 25 MG tablet Take 1 tablet by mouth every 8 (eight) hours as needed for itching.  06/12/17   Janie Morning, DO  insulin aspart (NOVOLOG FLEXPEN) 100 UNIT/ML FlexPen Inject 15 Units into the skin 3 (three) times daily with meals. <149 =15 units, 150-180 = 17 units, 181-210= 19 units, 211-240= 21 units, 241-270= 23 units, >271= 25 units    Averneni, Madhavi, MD  Insulin Glargine (LANTUS SOLOSTAR) 100 UNIT/ML Solostar Pen Inject 45 Units into the skin at bedtime. Per endocrine visit on 07/28/18    Madelin Rear, MD  Multiple Vitamins-Minerals (CENTRUM SILVER 50+MEN PO) Take 1 tablet by mouth daily.    [provider]  Multiple Vitamins-Minerals (EYE VITAMINS PO) Take 2 capsules by mouth 2 (  two) times daily. PerserVision    [provider]  NON FORMULARY Take 1 capsule by mouth 2 (two) times daily. Omega XL OTC    [provider]  nystatin-triamcinolone ointment (MYCOLOG) Apply 1 application topically 2 (two) times daily. Apply to both feet twice daily until scales are gone 07/30/18   Marzetta Board, DPM  pantoprazole (PROTONIX) 40 MG tablet Take 40 mg by mouth 2 (two) times daily.    [provider]  Saw Palmetto 450 MG CAPS Take 450 mg by mouth 2 (two) times daily. Taking for BPH    [provider]    Physical Exam: Vitals:   11/21/18 2000 11/21/18 2045 11/21/18 2051 11/21/18 2130  BP: 113/82 (!) 103/50  113/74  Pulse: (!) 118 (!) 124 (!) 38 (!) 113  Resp: 20 (!) '23 20 20  '$ Temp:      TempSrc:      SpO2: 97% 97% 94% 90%  Weight:      Height:        Constitutional: Obese man resting supine in bed, somnolent but awakens with intermittent agitation Eyes: Exam limited as patient keeps eyes closed, right pupil is reactive to light, unable to assess left pupil. ENMT: Mucous membranes are dry. Posterior pharynx clear of any exudate or lesions.  Neck: normal, supple,  no masses. Respiratory: clear to auscultation anteriorly, no wheezing, no crackles. Normal respiratory effort. No accessory muscle use.  Cardiovascular: Regular rate and rhythm, no murmurs / rubs / gallops. No extremity edema. Abdomen: no tenderness, no masses palpated. No hepatosplenomegaly.  Musculoskeletal: no clubbing / cyanosis. No joint deformity upper and lower extremities. Good ROM, no contractures. Normal muscle tone.  Skin: Yellow scaly rash both lower extremities Neurologic: Exam limited due to altered mental status, moves all extremities spontaneously and withdraws feet to light touch, strength appears intact Psychiatric: Somnolent but awakens.  On exam he is oriented to person and able to say the month and day of his date of birth.    Labs on Admission: I have personally reviewed following labs and imaging studies  CBC: Recent Labs  Lab 11/21/18 1601  WBC 14.0*  NEUTROABS 12.6*  HGB 12.0*  HCT 39.9  MCV 84.7  PLT 185   Basic Metabolic Panel: Recent Labs  Lab 11/21/18 1656  NA 132*  K 4.5  CL 91*  CO2 29  GLUCOSE 500*  BUN 19  CREATININE 1.28*  CALCIUM 8.1*   GFR: Estimated Creatinine Clearance: 71.1 mL/min (A) (by C-G formula based on SCr of 1.28 mg/dL (H)). Liver Function Tests: Recent Labs  Lab 11/21/18 1656  AST 18  ALT 16  ALKPHOS 110  BILITOT 0.8  PROT 6.7  ALBUMIN 3.6   No results for input(s): LIPASE, AMYLASE in the last 168 hours. No results for input(s): AMMONIA in the last 168 hours. Coagulation Profile: No results for input(s): INR, PROTIME in the last 168 hours. Cardiac Enzymes: Recent Labs  Lab 11/21/18 1656  TROPONINI <0.03   BNP (last 3 results) No results for input(s): PROBNP in the last 8760 hours. HbA1C: No results for input(s): HGBA1C in the last 72 hours. CBG: Recent Labs  Lab 11/21/18 1643 11/21/18 1759 11/21/18 1903 11/21/18 2021 11/21/18 2151  GLUCAP 495* 463* 459* 198* 159*   Lipid Profile: No results for  input(s): CHOL, HDL, LDLCALC, TRIG, CHOLHDL, LDLDIRECT in the last 72 hours. Thyroid Function Tests: No results for input(s): TSH, T4TOTAL, FREET4, T3FREE, THYROIDAB in the last 72 hours. Anemia Panel:  No results for input(s): VITAMINB12, FOLATE, FERRITIN, TIBC, IRON, RETICCTPCT in the last 72 hours. Urine analysis:    Component Value Date/Time   COLORURINE YELLOW 11/21/2018 1557   APPEARANCEUR CLEAR 11/21/2018 1557   LABSPEC 1.024 11/21/2018 1557   PHURINE 5.0 11/21/2018 1557   GLUCOSEU >=500 (A) 11/21/2018 1557   HGBUR NEGATIVE 11/21/2018 1557   BILIRUBINUR NEGATIVE 11/21/2018 1557   KETONESUR 20 (A) 11/21/2018 1557   PROTEINUR NEGATIVE 11/21/2018 1557   UROBILINOGEN 1.0 12/05/2014 1519   NITRITE NEGATIVE 11/21/2018 1557   LEUKOCYTESUR NEGATIVE 11/21/2018 1557    Radiological Exams on Admission: Dg Chest 1 View  Result Date: 11/21/2018 CLINICAL DATA:  Altered mental status EXAM: CHEST  1 VIEW COMPARISON:  01/13/2018 chest radiograph. FINDINGS: Stable cardiomediastinal silhouette with mild cardiomegaly. No pneumothorax. Chronic mild diffuse pleural thickening, unchanged. No convincing pleural effusion. Cephalization of the pulmonary vasculature without overt pulmonary edema. No acute consolidative airspace disease. IMPRESSION: Stable mild cardiomegaly without overt pulmonary edema. Stable chronic mild diffuse pleural thickening. No acute pulmonary disease. Electronically Signed   By: Ilona Sorrel M.D.   On: 11/21/2018 17:37    EKG: Independently reviewed. Sinus arrhythmia with tachycardia, incomplete RBBB.  Not significantly changed from prior.  Assessment/Plan Principal Problem:   Altered mental status Active Problems:   Essential hypertension   Paranoid schizophrenia ([place])   Type 2 diabetes mellitus without complication, with long-term current use of insulin (HCC)   Chronic diastolic CHF (congestive heart failure) (HCC)   OSA (obstructive sleep apnea)   Paroxysmal atrial  fibrillation (HCC)  MADOX CORKINS is a 73 y.o. male with medical history significant for COPD, chronic diastolic CHF, hypertension, hyperlipidemia, insulin-dependent type 2 diabetes, OSA/OHS not on CPAP, atrial fibrillation on Eliquis, and schizophrenia who was admitted for altered mental status and hyperglycemia.   Altered mental status: Suspect multifactorial from underlying untreated schizophrenia, hyperglycemia, untreated OSA/OHS, and potentially improper medication use.  New exam is limited, however moves all extremities without obvious focal deficit.  He is becoming more awake and answering simple questions but still not really following commands.  No obvious infectious source. -Obtain CT head without contrast when able -Hold home trazodone, gabapentin, hydroxyzine -Check UDS -Continue maintenance IV fluids overnight -Likely needs further psychiatric evaluation  Hyperglycemia in insulin-dependent type 2 diabetes: Has not been taking insulin recently.  Serum glucose was 500 on admission.  He was started on insulin infusion in the ED.  Glucose has improved to less than 200.  No evidence of DKA or HHS. -Stop insulin infusion -Start reduced prior Lantus at 30 units and SSI  Paroxysmal atrial fibrillation: Appears to be in sinus rhythm on admission. -Hold Eliquis for now pending CT head  Chronic diastolic CHF: Echocardiogram 05/26/2016 showed EF 60-65% with grade 2 diastolic dysfunction.  Does not appear to be on medical management.  He appears slightly dehydrated on exam. -Monitor daily weights and strict I/O's  COPD: Does not appear to have acute exacerbation. -Continue home inhalers, albuterol nebulizer as needed -Supplemental oxygen as needed to maintain O2 sats 88-92%  Hypertension: Blood pressure has been normotensive, he is not on antihypertensives as an outpatient. -Continue monitor  Schizophrenia: Per wife, has been taken off of prior medications. -Restart Celexa -Will  eventually need further psychiatric evaluation  DVT prophylaxis: SCDs Code Status: Full code per prior Family Communication: Discussed with wife by phone  Disposition Plan: Pending clinical progress Consults called: None Admission status: Observation   Zada Finders MD [facility] Pager [phone]  If 7PM-7AM, please contact night-coverage [email or web address]  11/21/2018, 10:29 PM

## 2018-11-21 NOTE — ED Notes (Addendum)
Pt yells out when BP cuff was recycled - pt does not have meaningful conversation or words. Pt drowsy from medication. Pt will relax and close eyes with verbal consolation, although occasionally yells out.

## 2018-11-21 NOTE — ED Notes (Signed)
Pt attempting to get out of bed. Easily redirectable. Pt pulling O2 off and leads off

## 2018-11-21 NOTE — ED Notes (Signed)
Pt will not let me place BP cuff on arm . Pt telling me to leave him alone and to shut up

## 2018-11-21 NOTE — ED Notes (Signed)
Per pharmacy, patient was prescribed a pump via Dr. [name]. The dr is currently on extended leave and other physicians are overseeing care. The pump has not been approved via insurance. Patient was dispensed one insulin pen (Novolog) but they never picked up the pen from the pharmacy and has not filled Lantus in 6 months. Patient and spouse use a pharmacy in a different location so it is hard to confirm compliance of patient taking medications. Pharmacist also added that it is normal for the patient to be hard to understand and states that the spouse usually communicates for him.

## 2018-11-21 NOTE — ED Provider Notes (Signed)
[facility] EMERGENCY DEPARTMENT Provider Note   CSN: [id number] Arrival date & time: 11/21/18  1457    History   Chief Complaint Chief Complaint  Patient presents with  . Altered Mental Status    HPI Rick Cruz is a 73 y.o. male.      Patient brought in by EMS.  Patient's wife called for increasing confusion and agitation over the past 12 hours.  She was concerned that he has not been taking his psychiatric meds has a history of schizophrenia supposed to be on Eliquis and also has a history of diabetes and not taking any of those medications.  EMS found that his urine was malodorous and that he had feces all down his leg abdomen and back.  They felt that monitor was showing atrial fibrillation.  Stated that his blood sugar was reading high.  Patient was confused but more on the agitated side certainly had no depressed mental status.     Past Medical History:  Diagnosis Date  . Arthritis   . 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 CHF (congestive heart failure) (HCC)    diastolic   . Chronic pain   . COPD (chronic obstructive pulmonary disease) ([place])   . Depression   . Diabetes mellitus without complication (HCC)    insulin pump  . Hypercholesterolemia   . Hypertension   . On home O2    3 liters  . Oxygen deficiency   . Schizophrenia ([place])   . Sleep apnea    noncompliant with BiPAP  . Sleep apnea   . Tobacco abuse     Patient Active Problem List   Diagnosis Date Noted  . OSA (obstructive sleep apnea)   . Chronic respiratory failure with hypercapnia ([place])   . Undifferentiated schizophrenia ([place])   . Uncontrolled type 2 diabetes mellitus with complication ([place])   . Anemia of chronic disease   . Paroxysmal atrial fibrillation (HCC)   . Hyponatremia 06/12/2016  . Aspiration pneumonia ([place]) 08/16/2015  . Obesity hypoventilation syndrome ([place]) 04/05/2015  . CSA (central sleep apnea) 04/05/2015  . Non compliance with medical treatment 04/05/2015  . Acute  respiratory failure with hypoxia and hypercarbia (HCC)   . Chronic diastolic CHF (congestive heart failure) ([place]) 08/06/2014  . Recurrent falls 05/08/2014  . Ankle fracture, right 05/08/2014  . Ankle fracture, left 05/08/2014  . C. difficile colitis 01/04/2014  . Delirium 01/02/2014  . Acute encephalopathy 01/02/2014  . Morbid obesity ([place]) 06/17/2013  . Erosive esophagitis 04/22/2013  . Chronic respiratory failure 02 dep with hypercarbia 04/22/2013  . Type 2 diabetes mellitus without complication, with long-term current use of insulin ([place]) 04/21/2013  . Paranoid schizophrenia ([place]) 04/18/2013  . CAD (coronary artery disease) 04/18/2013  . DYSLIPIDEMIA 08/25/2008  . Obstructive sleep apnea 08/25/2008  . Essential hypertension 08/25/2008    Past Surgical History:  Procedure Laterality Date  . COLON SURGERY  March 2010   secondary to large colon polyp, final path per discharge summary notes tubulovillous adenoma.   . COLONOSCOPY  July 2011   Dr. Benson Norway: multiple polyps, internal and external hemorrhoids, diverticulosis. tubular adenoma  . ESOPHAGOGASTRODUODENOSCOPY (EGD) WITH PROPOFOL N/A 04/20/2013   Procedure: ESOPHAGOGASTRODUODENOSCOPY (EGD) WITH PROPOFOL;  Surgeon: Daneil Dolin, MD;  Location: [facility];  Service: Endoscopy;  Laterality: N/A;  . KNEE SURGERY     X2        Home Medications    Prior to Admission medications   Medication Sig  Start Date End Date Taking? Authorizing Provider  albuterol (PROVENTIL HFA;VENTOLIN HFA) 108 (90 BASE) MCG/ACT inhaler Inhale 2 puffs into the lungs every 6 (six) hours as needed for wheezing or shortness of breath. 05/11/14  Yes Black, Lezlie Octave, NP  albuterol (PROVENTIL) (2.5 MG/3ML) 0.083% nebulizer solution Take 3 mLs (2.5 mg total) by nebulization every 6 (six) hours as needed for wheezing or shortness of breath. 08/21/15  Yes Kathie Dike, MD  gabapentin (NEURONTIN) 300 MG capsule Take 300 mg by mouth 4 (four) times daily.   Yes [provider]  metFORMIN (GLUCOPHAGE-XR) 750 MG 24 hr tablet Take 750 mg by mouth daily with breakfast.   Yes [provider]  simvastatin (ZOCOR) 40 MG tablet Take 40 mg by mouth every evening.   Yes [provider]  traZODone (DESYREL) 100 MG tablet Take 100 mg by mouth at bedtime.    Yes [provider]  acetaminophen (TYLENOL) 500 MG tablet Take 1,000 mg by mouth 2 (two) times daily as needed for mild pain or headache.    [provider]  apixaban (ELIQUIS) 5 MG TABS tablet Take 1 tablet (5 mg total) by mouth 2 (two) times daily. 06/15/16   Rai, Ripudeep Raliegh Ip, MD  cholecalciferol (VITAMIN D) 1000 UNITS tablet Take 2,000 Units by mouth daily.    [provider]  CINNAMON PO Take 1,000 mg by mouth daily.     [provider]  citalopram (CELEXA) 40 MG tablet Take 40 mg by mouth daily.    [provider]  diclofenac sodium (VOLTAREN) 1 % GEL Apply 1 application topically 3 (three) times daily. For neck pain    Janie Morning, DO  Dulaglutide (TRULICITY) 1.61 WR/6.0AV SOPN Inject 0.75 mg into the skin once a week. Fridays- given 2 weeks sample to start 10/03/18 by Averneni    [provider]  Fluticasone-Umeclidin-Vilant (TRELEGY ELLIPTA) 100-62.5-25 MCG/INH AEPB Inhale 1 puff into the lungs daily. Samples given 10/01/18 by Dr. Luan Pulling    [provider]  GUAIFENESIN PO Take 2 tablets by mouth 2 (two) times daily. 400mg  tablets, takes 2 tablets twice daily.    [provider]  hydrOXYzine (ATARAX/VISTARIL) 25 MG tablet Take 1 tablet by mouth every 8 (eight) hours as needed for itching.  06/12/17   Janie Morning, DO  insulin aspart (NOVOLOG FLEXPEN) 100 UNIT/ML FlexPen Inject 15 Units into the skin 3 (three) times daily with meals. <149 =15 units, 150-180 = 17 units, 181-210= 19 units, 211-240= 21 units, 241-270= 23 units, >271= 25 units    Averneni, Madhavi, MD  Insulin Glargine (LANTUS SOLOSTAR) 100 UNIT/ML Solostar Pen  Inject 45 Units into the skin at bedtime. Per endocrine visit on 07/28/18    Madelin Rear, MD  Multiple Vitamins-Minerals (CENTRUM SILVER 50+MEN PO) Take 1 tablet by mouth daily.    [provider]  Multiple Vitamins-Minerals (EYE VITAMINS PO) Take 2 capsules by mouth 2 (two) times daily. PerserVision    [provider]  NON FORMULARY Take 1 capsule by mouth 2 (two) times daily. Omega XL OTC    [provider]  nystatin-triamcinolone ointment (MYCOLOG) Apply 1 application topically 2 (two) times daily. Apply to both feet twice daily until scales are gone 07/30/18   Marzetta Board, DPM  pantoprazole (PROTONIX) 40 MG tablet Take 40 mg by mouth 2 (two) times daily.    [provider]  Saw Palmetto 450 MG CAPS Take 450 mg by mouth 2 (two) times daily. Taking  for BPH    [provider]    Family History Family History  Problem Relation Age of Onset  . Thyroid disease Mother   . Parkinson's disease Father   . Parkinson's disease Other   . Colon cancer Neg Hx     Social History Social History   Tobacco Use  . Smoking status: Former Smoker    Packs/day: 1.00    Years: 50.00    Pack years: 50.00    Types: Cigarettes    Last attempt to quit: 09/16/2013    Years since quitting: 5.1  . Smokeless tobacco: Never Used  Substance Use Topics  . Alcohol use: No    Comment: history of ETOH abuse in remote past, none in at least 10 years  . Drug use: No     Allergies   Phenergan [promethazine hcl]; Haldol [haloperidol lactate]; and Metformin and related   Review of Systems Review of Systems  Unable to perform ROS: Psychiatric disorder     Physical Exam Updated Vital Signs BP (!) 140/115 (BP Location: Left Arm)   Pulse (!) 123   Temp 99.1 F (37.3 C) (Axillary)   Resp (!) 22   Ht 1.829 m (6')   Wt 124.3 kg   SpO2 94%   BMI 37.16 kg/m   Physical Exam Vitals signs and nursing note reviewed.  Constitutional:       Appearance: Normal appearance. He is well-developed.  HENT:     Head: Normocephalic and atraumatic.     Nose: No congestion.     Mouth/Throat:     Mouth: Mucous membranes are moist.  Eyes:     Extraocular Movements: Extraocular movements intact.     Conjunctiva/sclera: Conjunctivae normal.     Pupils: Pupils are equal, round, and reactive to light.  Neck:     Musculoskeletal: Normal range of motion and neck supple.  Cardiovascular:     Rate and Rhythm: Regular rhythm. Tachycardia present.     Heart sounds: No murmur.  Pulmonary:     Effort: Pulmonary effort is normal. No respiratory distress.     Breath sounds: Normal breath sounds.  Abdominal:     Palpations: Abdomen is soft.     Tenderness: There is no abdominal tenderness.  Musculoskeletal: Normal range of motion.        General: No swelling.  Skin:    General: Skin is warm and dry.     Capillary Refill: Capillary refill takes less than 2 seconds.  Neurological:     General: No focal deficit present.     Mental Status: He is alert.     Cranial Nerves: No cranial nerve deficit.     Sensory: No sensory deficit.     Motor: No weakness.     Coordination: Coordination normal.      ED Treatments / Results  Labs (all labs ordered are listed, but only abnormal results are displayed) Labs Reviewed  URINALYSIS, ROUTINE W REFLEX MICROSCOPIC - Abnormal; Notable for the following components:      Result Value   Glucose, UA >=500 (*)    Ketones, ur 20 (*)    All other components within normal limits  CBC WITH DIFFERENTIAL/PLATELET - Abnormal; Notable for the following components:   WBC 14.0 (*)    Hemoglobin 12.0 (*)    MCH 25.5 (*)    Neutro Abs 12.6 (*)    Lymphs Abs 0.6 (*)    Abs Immature Granulocytes 0.09 (*)    All other components  within normal limits  COMPREHENSIVE METABOLIC PANEL - Abnormal; Notable for the following components:   Sodium 132 (*)    Chloride 91 (*)    Glucose, Bld 500 (*)    Creatinine, Ser 1.28  (*)    Calcium 8.1 (*)    GFR calc non Af Amer 56 (*)    All other components within normal limits  CBG MONITORING, ED - Abnormal; Notable for the following components:   Glucose-Capillary 596 (*)    All other components within normal limits  CBG MONITORING, ED - Abnormal; Notable for the following components:   Glucose-Capillary 495 (*)    All other components within normal limits  CBG MONITORING, ED - Abnormal; Notable for the following components:   Glucose-Capillary 463 (*)    All other components within normal limits  BRAIN NATRIURETIC PEPTIDE  LACTIC ACID, PLASMA  TROPONIN I  LACTIC ACID, PLASMA    EKG EKG Interpretation  Date/Time:  Friday November 21 2018 18:37:14 EST Ventricular Rate:  113 PR Interval:    QRS Duration: 127 QT Interval:  333 QTC Calculation: 457 R Axis:   41 Text Interpretation:  Sinus tachycardia Atrial premature complexes IVCD, consider atypical RBBB Confirmed by Fredia Sorrow [id number]) on 11/21/2018 6:40:28 PM   Radiology Dg Chest 1 View  Result Date: 11/21/2018 CLINICAL DATA:  Altered mental status EXAM: CHEST  1 VIEW COMPARISON:  01/13/2018 chest radiograph. FINDINGS: Stable cardiomediastinal silhouette with mild cardiomegaly. No pneumothorax. Chronic mild diffuse pleural thickening, unchanged. No convincing pleural effusion. Cephalization of the pulmonary vasculature without overt pulmonary edema. No acute consolidative airspace disease. IMPRESSION: Stable mild cardiomegaly without overt pulmonary edema. Stable chronic mild diffuse pleural thickening. No acute pulmonary disease. Electronically Signed   By: Ilona Sorrel M.D.   On: 11/21/2018 17:37    Procedures Procedures (including critical care time)  CRITICAL CARE Performed by: Fredia Sorrow Total critical care time: 30 minutes Critical care time was exclusive of separately billable procedures and treating other patients. Critical care was necessary to treat or prevent imminent or  life-threatening deterioration. Critical care was time spent personally by me on the following activities: development of treatment plan with patient and/or surrogate as well as nursing, discussions with consultants, evaluation of patient's response to treatment, examination of patient, obtaining history from patient or surrogate, ordering and performing treatments and interventions, ordering and review of laboratory studies, ordering and review of radiographic studies, pulse oximetry and re-evaluation of patient's condition.   Medications Ordered in ED Medications  dextrose 5 %-0.45 % sodium chloride infusion (has no administration in time range)  insulin regular, human (MYXREDLIN) 100 units/ 100 mL infusion (8.1 Units/hr Intravenous Rate/Dose Change 11/21/18 1800)  0.9 %  sodium chloride infusion (has no administration in time range)  sterile water (preservative free) injection (has no administration in time range)  sodium chloride 0.9 % bolus 500 mL (0 mLs Intravenous Stopped 11/21/18 1810)  LORazepam (ATIVAN) injection 1 mg (1 mg Intravenous Given 11/21/18 1636)  ziprasidone (GEODON) injection 10 mg (10 mg Intramuscular Given 11/21/18 1809)     Initial Impression / Assessment and Plan / ED Course  I have reviewed the triage vital signs and the nursing notes.  Pertinent labs & imaging results that were available during my care of the patient were reviewed by me and considered in my medical decision making (see chart for details).       EKG here Exie Parody consistent with a sinus tachycardia.  Patient was not febrile was tachycardic not  hypotensive received some Ativan but initially was redirectable and was fairly calm but eventually got more combative trying to take things out so he had to get Geodon.  Chest x-ray negative lactic acid not elevated urinalysis normal unfortunately head CT was not done because the machine broke.  Discussed with hospitalist on where they wanted to admit either here or down  at [facility] where the CT could be done but clinically it seemed as if patient did not have any significant mental status change like a head bleed or anything like that.  Blood sugar was extremely elevated so started on glucose stabilizer certainly needed admission just based on that alone.  And that hospitalist will determine whether admission would be here at [facility].  Edition no evidence of any acidosis.  No evidence of any serious infection.   Final Clinical Impressions(s) / ED Diagnoses   Final diagnoses:  Altered mental status, unspecified altered mental status type  Hyperglycemia    ED Discharge Orders    None       Fredia Sorrow, MD 11/21/18 2358

## 2018-11-21 NOTE — ED Notes (Signed)
Pt combative . Will not kep O2 on or allow me to take BP. Pushed this nurse away. Uncooperative in CT and Xray

## 2018-11-21 NOTE — ED Notes (Signed)
Spoke with DR Posey Pronto and he gave verbal order to stop insulin drip and NOT to give dextrose 5%-0.45% NS at this time. Dr Posey Pronto is on way to evaluate pt.

## 2018-11-21 NOTE — ED Notes (Signed)
Spoke with wife and reports pt has been confused and that last night he was horrible. Pt would not keep oxygen on last night and threw all his medical equipment in the house. Pt not using insulin pump. Pt reports that today he had BM in lift chair and wouldn't get up to let anyone clean him  Wifes number [phone]

## 2018-11-21 NOTE — ED Notes (Signed)
Pt c/o headache. Pt transferred from ED stretcher to hospital bed for comfort, no beds upstairs, will hold pt in ED. Pt able to stand and take a few small steps with standby assist from two nurses and security.

## 2018-11-21 NOTE — ED Notes (Addendum)
Dr Posey Pronto at bedside- pt able to state name and birthday month and day. Pt reports headache. Pt is able to speak coherently when questioned is asked. Pt is able drink water from cup with straw without difficulty, no choking or problems noted swallowing- Dr Posey Pronto present for this.

## 2018-11-21 NOTE — ED Notes (Signed)
Dr Posey Pronto took pt off of oxygen at this time. Verbal order given to maintain O2 sats between 88-92% (pt has hx COPD). Will continue to monitor.

## 2018-11-22 DIAGNOSIS — I10 Essential (primary) hypertension: Secondary | ICD-10-CM

## 2018-11-22 DIAGNOSIS — G4733 Obstructive sleep apnea (adult) (pediatric): Secondary | ICD-10-CM

## 2018-11-22 DIAGNOSIS — R739 Hyperglycemia, unspecified: Secondary | ICD-10-CM | POA: Insufficient documentation

## 2018-11-22 DIAGNOSIS — Z794 Long term (current) use of insulin: Secondary | ICD-10-CM

## 2018-11-22 DIAGNOSIS — E119 Type 2 diabetes mellitus without complications: Secondary | ICD-10-CM

## 2018-11-22 DIAGNOSIS — I5032 Chronic diastolic (congestive) heart failure: Secondary | ICD-10-CM

## 2018-11-22 DIAGNOSIS — I480 Paroxysmal atrial fibrillation: Secondary | ICD-10-CM

## 2018-11-22 DIAGNOSIS — F200 Paranoid schizophrenia: Secondary | ICD-10-CM

## 2018-11-22 DIAGNOSIS — R4020 Unspecified coma: Secondary | ICD-10-CM | POA: Diagnosis not present

## 2018-11-22 LAB — BASIC METABOLIC PANEL
Anion gap: 12 (ref 5–15)
BUN: 21 mg/dL (ref 8–23)
CO2: 26 mmol/L (ref 22–32)
Calcium: 7.9 mg/dL — ABNORMAL LOW (ref 8.9–10.3)
Chloride: 96 mmol/L — ABNORMAL LOW (ref 98–111)
Creatinine, Ser: 1.46 mg/dL — ABNORMAL HIGH (ref 0.61–1.24)
GFR calc Af Amer: 55 mL/min — ABNORMAL LOW (ref >60)
GFR calc non Af Amer: 47 mL/min — ABNORMAL LOW (ref >60)
Glucose, Bld: 306 mg/dL — ABNORMAL HIGH (ref 70–99)
Potassium: 4.2 mmol/L (ref 3.5–5.1)
SODIUM: 134 mmol/L — AB (ref 135–145)

## 2018-11-22 LAB — GLUCOSE, CAPILLARY
Glucose-Capillary: 369 mg/dL — ABNORMAL HIGH (ref 70–99)
Glucose-Capillary: 255 mg/dL — ABNORMAL HIGH (ref 70–99)

## 2018-11-22 LAB — CBC
HCT: 41.3 % (ref 39.0–52.0)
Hemoglobin: 12.2 g/dL — ABNORMAL LOW (ref 13.0–17.0)
MCH: 26 pg (ref 26.0–34.0)
MCHC: 29.5 g/dL — ABNORMAL LOW (ref 30.0–36.0)
MCV: 87.9 fL (ref 80.0–100.0)
NRBC: 0.2 % (ref 0.0–0.2)
PLATELETS: 241 10*3/uL (ref 150–400)
RBC: 4.7 MIL/uL (ref 4.22–5.81)
RDW: 14.6 % (ref 11.5–15.5)
WBC: 8.5 10*3/uL (ref 4.0–10.5)

## 2018-11-22 LAB — HEMOGLOBIN A1C
Hgb A1c MFr Bld: 11.4 % — ABNORMAL HIGH (ref 4.8–5.6)
MEAN PLASMA GLUCOSE: 280.48 mg/dL

## 2018-11-22 LAB — CBG MONITORING, ED
Glucose-Capillary: 322 mg/dL — ABNORMAL HIGH (ref 70–99)
Glucose-Capillary: 240 mg/dL — ABNORMAL HIGH (ref 70–99)
Glucose-Capillary: 356 mg/dL — ABNORMAL HIGH (ref 70–99)

## 2018-11-22 MED ORDER — INSULIN ASPART 100 UNIT/ML FLEXPEN: 15.0000 [IU] | PEN_INJECTOR | Freq: Three times a day (TID) | SUBCUTANEOUS | 3 refills | Status: DC

## 2018-11-22 MED ORDER — INSULIN ASPART 100 UNIT/ML ~~LOC~~ SOLN
0.0000 [IU] | Freq: Three times a day (TID) | SUBCUTANEOUS | Status: DC
  Administered 2018-11-22 (×2): 15 [IU] via SUBCUTANEOUS
  Administered 2018-11-23 (×2): 8 [IU] via SUBCUTANEOUS
  Administered 2018-11-23: 5 [IU] via SUBCUTANEOUS
  Administered 2018-11-24 (×2): 3 [IU] via SUBCUTANEOUS
  Administered 2018-11-24: 5 [IU] via SUBCUTANEOUS
  Administered 2018-11-25: 2 [IU] via SUBCUTANEOUS
  Administered 2018-11-25: 3 [IU] via SUBCUTANEOUS
  Filled 2018-11-22: qty 1

## 2018-11-22 MED ORDER — INSULIN GLARGINE 100 UNIT/ML ~~LOC~~ SOLN
30.0000 [IU] | Freq: Two times a day (BID) | SUBCUTANEOUS | Status: DC
  Administered 2018-11-22 – 2018-11-23 (×3): 30 [IU] via SUBCUTANEOUS
  Filled 2018-11-22 (×5): qty 0.3

## 2018-11-22 MED ORDER — HYDROXYZINE HCL 25 MG PO TABS: 25.0000 mg | ORAL_TABLET | Freq: Two times a day (BID) | ORAL | Status: DC | PRN

## 2018-11-22 MED ORDER — INSULIN GLARGINE 100 UNIT/ML SOLOSTAR PEN: 30.0000 [IU] | PEN_INJECTOR | Freq: Two times a day (BID) | SUBCUTANEOUS | 3 refills | Status: DC

## 2018-11-22 MED ORDER — GABAPENTIN 300 MG PO CAPS: 300.0000 mg | ORAL_CAPSULE | Freq: Three times a day (TID) | ORAL | Status: DC

## 2018-11-22 MED ORDER — PEN NEEDLES 31G X 8 MM MISC: 3 refills | Status: DC

## 2018-11-22 NOTE — Progress Notes (Signed)
Has been alert and oriented since admitted to floor this afternoon.  Just requested something for nerves or to help with sleep tonight.  Texted Dr. Dyann Kief.

## 2018-11-22 NOTE — Progress Notes (Signed)
LCSW received a call from Dr Barrett Henle and further explained the situation of the patient. LCSW is awaiting a call back from patients son to collect and complete information necessary for assessment and Fl2 as per Doctor the son is willing to have private pay for a facility.LCSW will attempt to get information and send out patient information via the [place] but it may be unlikely we will find him a placement over the weekend.  Called patient son and am awaiting a call back.  [employer] LCSW [phone]

## 2018-11-22 NOTE — Progress Notes (Signed)
RN paged Jeannette Corpus, NP to report patient requests order for Trazadone 100 mg for bedtime as he takes this medication at home, awaiting response.  P.J. Linus Mako, RN

## 2018-11-22 NOTE — ED Notes (Signed)
ED TO INPATIENT HANDOFF REPORT  ED Nurse Name and Phone #: Donah Driver [phone]  S Name/Age/Gender Rick Cruz 73 y.o. male Room/Bed: APA02/APA02  Code Status   Code Status: Full Code  Home/SNF/Other Nursing Home Patient oriented to: self Is this baseline? Yes   Triage Complete: Triage complete  Chief Complaint hyperglycemia  Triage Note Per EMS, pt from home. Wife called out for increasing confusion over the past 12 hrs. Upon EMS arrival, pt found to have malodorous urine, feces down leg, on abdomen and on back. Pt in A.Fib on monitor. EMS states CBG machine read "high." Pt confused and combative at this time.     Allergies Allergies  Allergen Reactions  . Phenergan [Promethazine Hcl] Other (See Comments)    Becomes very confused and aggressive  . Haldol [Haloperidol Lactate] Other (See Comments)    Shaking   . Metformin And Related Diarrhea    Level of Care/Admitting Diagnosis ED Disposition    ED Disposition Condition [facility] Area: [facility] [[id number]]  Level of Care: Telemetry [5]  Diagnosis: Altered mental status [780.97.ICD-9-CM]  Admitting Physician: Lenore Cordia [[id number]]  Attending Physician: Lenore Cordia [[id number]]  PT Class (Do Not Modify): Observation [104]  PT Acc Code (Do Not Modify): Observation [10022]       B Medical/Surgery History Past Medical History:  Diagnosis Date  . Arthritis   . 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 CHF (congestive heart failure) (HCC)    diastolic   . Chronic pain   . COPD (chronic obstructive pulmonary disease) ([place])   . Depression   . Diabetes mellitus without complication (HCC)    insulin pump  . Hypercholesterolemia   . Hypertension   . On home O2    3 liters  . Oxygen deficiency   . Schizophrenia ([place])   . Sleep apnea    noncompliant with BiPAP  . Sleep apnea   . Tobacco abuse    Past Surgical History:  Procedure Laterality Date  . COLON SURGERY  March 2010    secondary to large colon polyp, final path per discharge summary notes tubulovillous adenoma.   . COLONOSCOPY  July 2011   Dr. Benson Norway: multiple polyps, internal and external hemorrhoids, diverticulosis. tubular adenoma  . ESOPHAGOGASTRODUODENOSCOPY (EGD) WITH PROPOFOL N/A 04/20/2013   Procedure: ESOPHAGOGASTRODUODENOSCOPY (EGD) WITH PROPOFOL;  Surgeon: Daneil Dolin, MD;  Location: [facility];  Service: Endoscopy;  Laterality: N/A;  . KNEE SURGERY     X2     A IV Location/Drains/Wounds Patient Lines/Drains/Airways Status   Active Line/Drains/Airways    Name:   Placement date:   Placement time:   Site:   Days:   Peripheral IV 11/21/18 Right Antecubital   11/21/18    1511    Antecubital   1          Intake/Output Last 24 hours  Intake/Output Summary (Last 24 hours) at 11/22/2018 1438 Last data filed at 11/22/2018 0932 Gross per 24 hour  Intake 944.21 ml  Output -  Net 944.21 ml    Labs/Imaging Results for orders placed or performed during the hospital encounter of 11/21/18 (from the past 48 hour(s))  CBG monitoring, ED     Status: Abnormal   Collection Time: 11/21/18  3:18 PM  Result Value Ref Range   Glucose-Capillary 596 (HH) 70 - 99 mg/dL   Comment 1 Notify RN   Urinalysis, Routine w reflex microscopic  Status: Abnormal   Collection Time: 11/21/18  3:57 PM  Result Value Ref Range   Color, Urine YELLOW YELLOW   APPearance CLEAR CLEAR   Specific Gravity, Urine 1.024 1.005 - 1.030   pH 5.0 5.0 - 8.0   Glucose, UA >=500 (A) NEGATIVE mg/dL   Hgb urine dipstick NEGATIVE NEGATIVE   Bilirubin Urine NEGATIVE NEGATIVE   Ketones, ur 20 (A) NEGATIVE mg/dL   Protein, ur NEGATIVE NEGATIVE mg/dL   Nitrite NEGATIVE NEGATIVE   Leukocytes,Ua NEGATIVE NEGATIVE   RBC / HPF 0-5 0 - 5 RBC/hpf   WBC, UA 0-5 0 - 5 WBC/hpf   Bacteria, UA NONE SEEN NONE SEEN    Comment: Performed at [facility], [address] [place], [place] [address]  Urine rapid drug screen (hosp performed)     Status:  None   Collection Time: 11/21/18  3:57 PM  Result Value Ref Range   Opiates NONE DETECTED NONE DETECTED   Cocaine NONE DETECTED NONE DETECTED   Benzodiazepines NONE DETECTED NONE DETECTED   Amphetamines NONE DETECTED NONE DETECTED   Tetrahydrocannabinol NONE DETECTED NONE DETECTED   Barbiturates NONE DETECTED NONE DETECTED    Comment: (NOTE) DRUG SCREEN FOR MEDICAL PURPOSES ONLY.  IF CONFIRMATION IS NEEDED FOR ANY PURPOSE, NOTIFY LAB WITHIN 5 DAYS. LOWEST DETECTABLE LIMITS FOR URINE DRUG SCREEN Drug Class                     Cutoff (ng/mL) Amphetamine and metabolites    1000 Barbiturate and metabolites    200 Benzodiazepine                 921 Tricyclics and metabolites     300 Opiates and metabolites        300 Cocaine and metabolites        300 THC                            50 Performed at [facility], [address], [place], [place] [address]   CBC with Differential/Platelet     Status: Abnormal   Collection Time: 11/21/18  4:01 PM  Result Value Ref Range   WBC 14.0 (H) 4.0 - 10.5 K/uL   RBC 4.71 4.22 - 5.81 MIL/uL   Hemoglobin 12.0 (L) 13.0 - 17.0 g/dL   HCT 39.9 39.0 - 52.0 %   MCV 84.7 80.0 - 100.0 fL   MCH 25.5 (L) 26.0 - 34.0 pg   MCHC 30.1 30.0 - 36.0 g/dL   RDW 14.4 11.5 - 15.5 %   Platelets 230 150 - 400 K/uL   nRBC 0.0 0.0 - 0.2 %   Neutrophils Relative % 90 %   Neutro Abs 12.6 (H) 1.7 - 7.7 K/uL   Lymphocytes Relative 4 %   Lymphs Abs 0.6 (L) 0.7 - 4.0 K/uL   Monocytes Relative 5 %   Monocytes Absolute 0.7 0.1 - 1.0 K/uL   Eosinophils Relative 0 %   Eosinophils Absolute 0.0 0.0 - 0.5 K/uL   Basophils Relative 0 %   Basophils Absolute 0.0 0.0 - 0.1 K/uL   Immature Granulocytes 1 %   Abs Immature Granulocytes 0.09 (H) 0.00 - 0.07 K/uL    Comment: Performed at [facility], [address] [place], [place] [address]  Brain natriuretic peptide     Status: None   Collection Time: 11/21/18  4:01 PM  Result Value Ref Range   B Natriuretic  Peptide 29.0 0.0  - 100.0 pg/mL    Comment: Performed at [facility], [address], [place], [place] [address]  Lactic acid, plasma     Status: None   Collection Time: 11/21/18  4:01 PM  Result Value Ref Range   Lactic Acid, Venous 1.8 0.5 - 1.9 mmol/L    Comment: Performed at [facility], [address], [place], [place] [address]  CBG monitoring, ED     Status: Abnormal   Collection Time: 11/21/18  4:43 PM  Result Value Ref Range   Glucose-Capillary 495 (H) 70 - 99 mg/dL  Comprehensive metabolic panel     Status: Abnormal   Collection Time: 11/21/18  4:56 PM  Result Value Ref Range   Sodium 132 (L) 135 - 145 mmol/L   Potassium 4.5 3.5 - 5.1 mmol/L   Chloride 91 (L) 98 - 111 mmol/L   CO2 29 22 - 32 mmol/L   Glucose, Bld 500 (H) 70 - 99 mg/dL   BUN 19 8 - 23 mg/dL   Creatinine, Ser 1.28 (H) 0.61 - 1.24 mg/dL   Calcium 8.1 (L) 8.9 - 10.3 mg/dL   Total Protein 6.7 6.5 - 8.1 g/dL   Albumin 3.6 3.5 - 5.0 g/dL   AST 18 15 - 41 U/L   ALT 16 0 - 44 U/L   Alkaline Phosphatase 110 38 - 126 U/L   Total Bilirubin 0.8 0.3 - 1.2 mg/dL   GFR calc non Af Amer 56 (L) >60 mL/min   GFR calc Af Amer >60 >60 mL/min   Anion gap 12 5 - 15    Comment: Performed at [facility], [address] [place], [place] [address]  Troponin I - ONCE - STAT     Status: None   Collection Time: 11/21/18  4:56 PM  Result Value Ref Range   Troponin I <0.03 <0.03 ng/mL    Comment: Performed at [facility], [address] [place], [place] [address]  Ethanol     Status: None   Collection Time: 11/21/18  5:57 PM  Result Value Ref Range   Alcohol, Ethyl (B) <10 <10 mg/dL    Comment: (NOTE) Lowest detectable limit for serum alcohol is 10 mg/dL. For medical purposes only. Performed at [facility], [address], [place], [place] [address]   CBG monitoring, ED     Status: Abnormal   Collection Time: 11/21/18  5:59 PM  Result Value Ref Range   Glucose-Capillary 463 (H) 70 - 99 mg/dL  CBG monitoring, ED     Status: Abnormal    Collection Time: 11/21/18  7:03 PM  Result Value Ref Range   Glucose-Capillary 459 (H) 70 - 99 mg/dL  CBG monitoring, ED     Status: Abnormal   Collection Time: 11/21/18  8:21 PM  Result Value Ref Range   Glucose-Capillary 198 (H) 70 - 99 mg/dL  CBG monitoring, ED     Status: Abnormal   Collection Time: 11/21/18  9:51 PM  Result Value Ref Range   Glucose-Capillary 159 (H) 70 - 99 mg/dL  CBG monitoring, ED     Status: Abnormal   Collection Time: 11/22/18  3:46 AM  Result Value Ref Range   Glucose-Capillary 322 (H) 70 - 99 mg/dL  Basic metabolic panel     Status: Abnormal   Collection Time: 11/22/18  6:09 AM  Result Value Ref Range   Sodium 134 (L) 135 - 145 mmol/L   Potassium 4.2 3.5 - 5.1 mmol/L  Comment: SLIGHT HEMOLYSIS   Chloride 96 (L) 98 - 111 mmol/L   CO2 26 22 - 32 mmol/L   Glucose, Bld 306 (H) 70 - 99 mg/dL   BUN 21 8 - 23 mg/dL   Creatinine, Ser 1.46 (H) 0.61 - 1.24 mg/dL   Calcium 7.9 (L) 8.9 - 10.3 mg/dL   GFR calc non Af Amer 47 (L) >60 mL/min   GFR calc Af Amer 55 (L) >60 mL/min   Anion gap 12 5 - 15    Comment: Performed at [facility], [address] [place], [place] [address]  CBC     Status: Abnormal   Collection Time: 11/22/18  6:09 AM  Result Value Ref Range   WBC 8.5 4.0 - 10.5 K/uL   RBC 4.70 4.22 - 5.81 MIL/uL   Hemoglobin 12.2 (L) 13.0 - 17.0 g/dL   HCT 41.3 39.0 - 52.0 %   MCV 87.9 80.0 - 100.0 fL   MCH 26.0 26.0 - 34.0 pg   MCHC 29.5 (L) 30.0 - 36.0 g/dL   RDW 14.6 11.5 - 15.5 %   Platelets 241 150 - 400 K/uL   nRBC 0.2 0.0 - 0.2 %    Comment: Performed at [facility], [address] [place], [place] [address]  CBG monitoring, ED     Status: Abnormal   Collection Time: 11/22/18  8:11 AM  Result Value Ref Range   Glucose-Capillary 240 (H) 70 - 99 mg/dL  CBG monitoring, ED     Status: Abnormal   Collection Time: 11/22/18 12:36 PM  Result Value Ref Range   Glucose-Capillary 356 (H) 70 - 99 mg/dL   Dg Chest 1 View  Result Date:  11/21/2018 CLINICAL DATA:  Altered mental status EXAM: CHEST  1 VIEW COMPARISON:  01/13/2018 chest radiograph. FINDINGS: Stable cardiomediastinal silhouette with mild cardiomegaly. No pneumothorax. Chronic mild diffuse pleural thickening, unchanged. No convincing pleural effusion. Cephalization of the pulmonary vasculature without overt pulmonary edema. No acute consolidative airspace disease. IMPRESSION: Stable mild cardiomegaly without overt pulmonary edema. Stable chronic mild diffuse pleural thickening. No acute pulmonary disease. Electronically Signed   By: Ilona Sorrel M.D.   On: 11/21/2018 17:37    Pending Labs Unresulted Labs (From admission, onward)    Start     Ordered   11/22/18 0858  Hemoglobin A1c  Once,   R    Comments:  To assess prior glycemic control    11/22/18 0857          Vitals/Pain Today's Vitals   11/22/18 0805 11/22/18 0807 11/22/18 1200 11/22/18 1216  BP:  106/63 108/66   Pulse: (!) 102 (!) 102    Resp:   17   Temp:      TempSrc:      SpO2: 96% 97%    Weight:      Height:      PainSc:    0-No pain    Isolation Precautions No active isolations  Medications Medications  sterile water (preservative free) injection (has no administration in time range)  sodium chloride flush (NS) 0.9 % injection 3 mL (3 mLs Intravenous Not Given 11/22/18 1022)  acetaminophen (TYLENOL) tablet 650 mg (650 mg Oral Given 11/21/18 2305)    Or  acetaminophen (TYLENOL) suppository 650 mg ( Rectal See Alternative 11/21/18 2305)  albuterol (PROVENTIL) (2.5 MG/3ML) 0.083% nebulizer solution 2.5 mg (has no administration in time range)  citalopram (CELEXA) tablet 40 mg (40 mg Oral Not Given 11/22/18 1022)  pantoprazole (PROTONIX) EC  tablet 40 mg (40 mg Oral Not Given 11/22/18 1021)  simvastatin (ZOCOR) tablet 40 mg (40 mg Oral Given 11/21/18 2308)  fluticasone furoate-vilanterol (BREO ELLIPTA) 100-25 MCG/INH 1 puff (1 puff Inhalation Not Given 11/22/18 0822)    And  umeclidinium bromide  (INCRUSE ELLIPTA) 62.5 MCG/INH 1 puff (1 puff Inhalation Not Given 11/22/18 0822)  insulin glargine (LANTUS) injection 30 Units (30 Units Subcutaneous Given 11/22/18 1215)  insulin aspart (novoLOG) injection 0-15 Units (15 Units Subcutaneous Given 11/22/18 1241)  sodium chloride 0.9 % bolus 500 mL (0 mLs Intravenous Stopped 11/21/18 1810)  LORazepam (ATIVAN) injection 1 mg (1 mg Intravenous Given 11/21/18 1636)  ziprasidone (GEODON) injection 10 mg (10 mg Intramuscular Given 11/21/18 1809)    Mobility walks with person assist High fall risk   Focused Assessments Neuro Assessment Handoff:  Swallow screen pass? Yes          Neuro Assessment: Within Defined Limits Neuro Checks:      Last Documented NIHSS Modified Score:   Has TPA been given? No If patient is a Neuro Trauma and patient is going to OR before floor call report to [place] nurse: [phone] or [phone]     R Recommendations: See Admitting Provider Note  Report given to:   Additional Notes: Extensive social work issues.

## 2018-11-22 NOTE — ED Notes (Signed)
Spoke with Claudine with social work who advised to contact [facility] and she will follow.  [facility] paged.

## 2018-11-22 NOTE — Progress Notes (Signed)
RN paged Jeannette Corpus, NP due to patient wanting something for "nerves", awaiting response.  P.J. Linus Mako, RN

## 2018-11-22 NOTE — ED Notes (Signed)
Given breakfast tray. 

## 2018-11-22 NOTE — Discharge Summary (Signed)
Physician Discharge Summary  Rick Cruz FXO:[id number] DOB: Jan 26, 1946 DOA: 11/21/2018  PCP: [name], MD  Admit date: 11/21/2018 Discharge date: 11/22/2018  Time spent: 35 minutes  Recommendations for Outpatient Follow-up:  1. Repeat basic metabolic panel to follow electrolytes and renal function 2. Reassess blood pressure and determine the need of antihypertensive medications 3. Close follow-up the patient CBGs/A1c with further adjustment to hypoglycemic regimen as needed. 4. Arrange outpatient follow-up with psychiatry service.   Discharge Diagnoses:  Principal Problem:   Altered mental status Active Problems:   Essential hypertension   Paranoid schizophrenia ([place])   Type 2 diabetes mellitus without complication, with long-term current use of insulin (HCC)   Chronic diastolic CHF (congestive heart failure) (HCC)   OSA (obstructive sleep apnea)   Paroxysmal atrial fibrillation ([place])   Discharge Condition: Stable and with mentation back to baseline.  Patient instructed to follow-up with PCP in 1 week and to have close follow-up with his endocrinologist in 10 days.  Diet recommendation: Low calorie diet and modify carbohydrates.  Filed Weights   11/21/18 1643  Weight: 124.3 kg    History of present illness:  73 y.o. male with medical history significant for COPD, chronic diastolic CHF, hypertension, hyperlipidemia, insulin-dependent type 2 diabetes, OSA/OHS not on CPAP, atrial fibrillation on Eliquis, and schizophrenia presents to the hospital with altered mental status.  Patient is unable to provide significant history but answers limited simple questions.  21 of history is obtained from wife by phone, EDP, and chart review.    Per wife patient has significant change in behavior since unfortunate loss of their son in the last year.  She states patient has not taking medications properly including his insulin and is unsure when he last uses insulin.  She states that he  has been ordering a lot of pharmaceutical supplements online which she is not sure.  Last night he became very agitated with increased confusion overnight.  EMS were called on prior ED documentation had malodorous urine and feces on body.  Reportedly in A. fib on monitor and CBG machine read "high."  ED Course:  In the ED, initial vitals show BP 140/115, pulse 120, RR 22, temp 99.1 Fahrenheit, SPO2 94% on 3 L O2 via [place].  Labs are notable for serum glucose 500, sodium 132, potassium 4.5, bicarb 29, BUN 19, creatinine 1.28, anion gap 12, AST 18, ALT 16, T bili 0.8, alk phos 110, troponin I <0.03, WBC 14, hemoglobin 12, platelets 230, lactic acid 1.8, urinalysis with 20 ketones, negative nitrites, negative leukocytes, no bacteria on microscopy.  Blood ethanol level <10.  Patient was noted to be agitated and combative.  He was given IV Ativan and Geodon and placed in soft restraints.  He was given 1 L normal saline and started on an insulin infusion. The hospitalist service was consulted to place in observation for further evaluation and/management of hyperglycemia and altered mental status.    Hospital Course:  Altered mental status: Metabolic encephalopathy -In the setting of hyperglycemia -Orders underlying conditions that can be playing a role in his altered mentation includes untreated schizophrenia, untreated obstructive sleep apnea and polypharmacy. -After controlling his blood sugar patient mentation is back to baseline and he is in no acute distress.  Able to follow commands properly and not demonstrating any signs of acute neurologic process. -There was no obvious infections sources appreciated on work-up. -Patient expressed some difficulty with lack of supplies to properly control his blood sugar. -Actively followed by endocrinologist as  an outpatient. -Will recommend outpatient follow-up with psychiatry service.  Uncontrolled type 2 diabetes with hyperglycemia -Resume metformin and  discharge on Lantus 30 units twice a day along with NovoLog 3 times a day with meals following the sliding scale. -Close follow-up with endocrinology service as an outpatient Supplies to be able to inject his insulin as instructed has been provided at discharge Patient advised to follow modified carbohydrate diet.  Paroxysmal atrial fibrillation -Stable and well-controlled -Resume Eliquis  Chronic diastolic heart failure -Compensated -Continue low-sodium diet and follow daily weights. -Patient instructed to maintain adequate hydration.  Chronic respiratory failure due to COPD -Stable and well-controlled -Continue 3 L oxygen supplementation as previously prescribed -Continue home inhalers and nebulizer therapy.  Hypertension -Continue low-sodium diet -Patient currently no using any antihypertensive medication -Outpatient follow-up on his vital signs/blood pressure with further decision to start antihypertensive medication as needed.  Schizophrenia -Will continue Celexa -Patient needs outpatient follow-up by psychiatry service.  Class II obesity -Body mass index is 37.16 kg/m. -Low calorie diet, portion control and increase physical activity has been discussed with patient.  Procedures:  See below for x-ray reports.  Consultations:  None  Discharge Exam: Vitals:   11/22/18 0805 11/22/18 0807  BP:  106/63  Pulse: (!) 102 (!) 102  Resp:    Temp:    SpO2: 96% 97%    General: Alert, awake and oriented x3; following commands appropriately and in no acute distress.  Patient is no longer agitated or combative.  Denies any chest pain, shortness of breath, nausea, vomiting, abdominal pain or any other complaints.  He is afebrile. Cardiovascular: S1 and S2, no rubs, no gallops, no JVD appreciated on exam. Respiratory: Positive scattered rhonchi, no wheezing, no crackles, normal respiratory effort.  Wearing his chronic 3 L nasal cannula supplementation. Abdomen: Obese, soft,  nontender, nondistended, positive bowel sounds Extremities: Trace edema bilaterally, no cyanosis, no clubbing.  Discharge Instructions   Discharge Instructions    (HEART FAILURE PATIENTS) Call MD:  Anytime you have any of the following symptoms: 1) 3 pound weight gain in 24 hours or 5 pounds in 1 week 2) shortness of breath, with or without a dry hacking cough 3) swelling in the hands, feet or stomach 4) if you have to sleep on extra pillows at night in order to breathe.   Complete by:  As directed    Diet - low sodium heart healthy   Complete by:  As directed    Diet Carb Modified   Complete by:  As directed    Discharge instructions   Complete by:  As directed    Follow modified carbohydrate diet Take medications as prescribed Keep yourself well-hydrated Follow-up with primary care doctor in 1 week and arrange follow-up with endocrinology service in the next 10 days.     Allergies as of 11/22/2018      Reactions   Phenergan [promethazine Hcl] Other (See Comments)   Becomes very confused and aggressive   Haldol [haloperidol Lactate] Other (See Comments)   Shaking    Metformin And Related Diarrhea      Medication List    STOP taking these medications   CINNAMON PO   Saw Palmetto 160 MG Caps   Trulicity 7.37 TG/6.2IR Sopn Generic drug:  Dulaglutide     TAKE these medications   acetaminophen 500 MG tablet Commonly known as:  TYLENOL Take 1,000 mg by mouth 2 (two) times daily as needed for mild pain or headache.   albuterol 108 (90  Base) MCG/ACT inhaler Commonly known as:  PROVENTIL HFA;VENTOLIN HFA Inhale 2 puffs into the lungs every 6 (six) hours as needed for wheezing or shortness of breath.   albuterol (2.5 MG/3ML) 0.083% nebulizer solution Commonly known as:  PROVENTIL Take 3 mLs (2.5 mg total) by nebulization every 6 (six) hours as needed for wheezing or shortness of breath.   apixaban 5 MG Tabs tablet Commonly known as:  ELIQUIS Take 1 tablet (5 mg total) by  mouth 2 (two) times daily.   cholecalciferol 1000 units tablet Commonly known as:  VITAMIN D Take 2,000 Units by mouth daily.   citalopram 40 MG tablet Commonly known as:  CELEXA Take 40 mg by mouth daily.   diclofenac sodium 1 % Gel Commonly known as:  VOLTAREN Apply 1 application topically 3 (three) times daily. For neck pain   EYE VITAMINS PO Take 2 capsules by mouth 2 (two) times daily. PerserVision   CENTRUM SILVER 50+MEN PO Take 1 tablet by mouth daily.   gabapentin 300 MG capsule Commonly known as:  NEURONTIN Take 1 capsule (300 mg total) by mouth 3 (three) times daily. What changed:  when to take this   GUAIFENESIN PO Take 2 tablets by mouth 2 (two) times daily. '400mg'$  tablets, takes 2 tablets twice daily.   hydrOXYzine 25 MG tablet Commonly known as:  ATARAX/VISTARIL Take 1 tablet (25 mg total) by mouth every 12 (twelve) hours as needed for itching. What changed:  when to take this   insulin aspart 100 UNIT/ML FlexPen Commonly known as:  NovoLOG FlexPen Inject 15-25 Units into the skin 3 (three) times daily with meals. <149 =15 units, 150-180 = 17 units, 181-210= 19 units, 211-240= 21 units, 241-270= 23 units, >271= 25 units What changed:  how much to take   Insulin Glargine 100 UNIT/ML Solostar Pen Commonly known as:  Lantus SoloStar Inject 30 Units into the skin 2 (two) times daily. Per endocrine visit on 07/28/18 What changed:    how much to take  when to take this   metFORMIN 750 MG 24 hr tablet Commonly known as:  GLUCOPHAGE-XR Take 750 mg by mouth daily with breakfast.   NON FORMULARY Take 1 capsule by mouth 2 (two) times daily. Omega XL OTC   nystatin-triamcinolone ointment Commonly known as:  MYCOLOG Apply 1 application topically 2 (two) times daily. Apply to both feet twice daily until scales are gone   pantoprazole 40 MG tablet Commonly known as:  PROTONIX Take 40 mg by mouth 2 (two) times daily.   Pen Needles 31G X 8 MM Misc Use to  inject insulin as instructed   simvastatin 40 MG tablet Commonly known as:  ZOCOR Take 40 mg by mouth every evening.   traZODone 100 MG tablet Commonly known as:  DESYREL Take 100 mg by mouth at bedtime.   Trelegy Ellipta 100-62.5-25 MCG/INH Aepb Generic drug:  Fluticasone-Umeclidin-Vilant Inhale 1 puff into the lungs daily. Samples given 10/01/18 by Dr. Luan Pulling      Allergies  Allergen Reactions  . Phenergan [Promethazine Hcl] Other (See Comments)    Becomes very confused and aggressive  . Haldol [Haloperidol Lactate] Other (See Comments)    Shaking   . Metformin And Related Diarrhea   Follow-up Information    [name], MD. Schedule an appointment as soon as possible for a visit in 1 week(s).   Specialty:  Internal Medicine Contact information: [address] [place] [address] [phone]  The results of significant diagnostics from this hospitalization (including imaging, microbiology, ancillary and laboratory) are listed below for reference.    Significant Diagnostic Studies: Dg Chest 1 View  Result Date: 11/21/2018 CLINICAL DATA:  Altered mental status EXAM: CHEST  1 VIEW COMPARISON:  01/13/2018 chest radiograph. FINDINGS: Stable cardiomediastinal silhouette with mild cardiomegaly. No pneumothorax. Chronic mild diffuse pleural thickening, unchanged. No convincing pleural effusion. Cephalization of the pulmonary vasculature without overt pulmonary edema. No acute consolidative airspace disease. IMPRESSION: Stable mild cardiomegaly without overt pulmonary edema. Stable chronic mild diffuse pleural thickening. No acute pulmonary disease. Electronically Signed   By: Ilona Sorrel M.D.   On: 11/21/2018 17:37   Labs: Basic Metabolic Panel: Recent Labs  Lab 11/21/18 1656 11/22/18 0609  NA 132* 134*  K 4.5 4.2  CL 91* 96*  CO2 29 26  GLUCOSE 500* 306*  BUN 19 21  CREATININE 1.28* 1.46*  CALCIUM 8.1* 7.9*   Liver Function  Tests: Recent Labs  Lab 11/21/18 1656  AST 18  ALT 16  ALKPHOS 110  BILITOT 0.8  PROT 6.7  ALBUMIN 3.6   CBC: Recent Labs  Lab 11/21/18 1601 11/22/18 0609  WBC 14.0* 8.5  NEUTROABS 12.6*  --   HGB 12.0* 12.2*  HCT 39.9 41.3  MCV 84.7 87.9  PLT 230 241   Cardiac Enzymes: Recent Labs  Lab 11/21/18 1656  TROPONINI <0.03   BNP: BNP (last 3 results) Recent Labs    01/13/18 1852 11/21/18 1601  BNP 160.0* 29.0   CBG: Recent Labs  Lab 11/21/18 1903 11/21/18 2021 11/21/18 2151 11/22/18 0346 11/22/18 0811  GLUCAP 459* 198* 159* 322* 240*    Signed:  Barton Dubois MD.  [facility] 11/22/2018, 11:21 AM

## 2018-11-22 NOTE — ED Notes (Signed)
Spoke extensively with Terance Ice with [facility] who states she will send someone out to evaluate pt on Monday morning, but nothing could be done regarding placement until then.  Son and pt's mother were present during my phone conversation with her.  They spoke with pt's wife who also advised them she was not willing to provide additional care for pt.

## 2018-11-22 NOTE — ED Notes (Signed)
Spoke with Lovena Le with [facility] who took information and is contacting her supervisor.

## 2018-11-22 NOTE — Progress Notes (Signed)
LCSW received a call from Terance Ice [phone] the SSW of [facility] of [place] the patient will be assessed by [facility] on Monday.  [employer] LCSW [phone]

## 2018-11-22 NOTE — Progress Notes (Addendum)
LCSW received call from [facility] ED RN and she explained that this patient has been abandoned in the ED by wife. As this worker is virtual this worker advised ED RN to contact [facility] to report the wife's response for continuing care and that she refused and would not pick up patient. ED RN has called and left message for [facility] and  for patients son and this worker asked if she could forward my contact information to son.  LCSW will attempt to collect information from son to get assessment and Fl2 started    [employer] LCSW  [phone]

## 2018-11-22 NOTE — Clinical Social Work Note (Signed)
Clinical Social Work Assessment  Patient Details  Name: Rick Cruz MRN: [id number] Date of Birth: 18-Mar-1946  Date of referral:  11/22/18               Reason for consult:  Abuse/Neglect                Permission sought to share information with:  Family Supports Permission granted to share information::  Yes, Verbal Permission Granted  Name::     wife son Saralyn Pilar  Agency::   Relationship::     Contact Information:     Housing/Transportation Living arrangements for the past 2 months:   at home single family home Source of Information:   Patients son Patient Interpreter Needed:   none Criminal Activity/Legal Involvement Pertinent to Current Situation/Hospitalization:   none Significant Relationships:   wife ,son Lives with:   Wife Do you feel safe going back to the place where you live?   Yes- Need for family participation in patient care:   yes  Care giving concerns:  Wife unable tomanage patient needs in the home and needs respite  Social Worker assessment / plan:  LCSW introduced myself to patients son. He reported his Dad has some challenges write now with dementia.LCSW spoke with son and patients wife she is agreeable to get some extra supports for her husband but reports he is a challenge when unwell. Patient has many health issues. He has 3 litre of 02- has sleep apnea, LCSW confirmed with medical notes and conversations with family members ( wife/son) He is diabetic with insulin, he is in hoist chair and mostly immobile ( 274 lbs)the wife reports she herself has become exhausted and weak. He has a lot of equiptment on home 02 according to wife he does not have home health. Patient has early signs of dementia. As per wife patient can become challenging when his sugars are high and he refuses to take his medications. 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  Patient requires full assistance with all his ADLs. Assessment and Fl2 started Wife is considering respite care for 2 weeks- She was encouraged to contact admissions director for [facility] for ST respite.   Employment status:    Retired Environmental consultant PT Recommendations:   TBD Information / Referral to community resources:   SNF  Patient/Family's Response to care:  Is aware [facility] was called because she refused to pick up patient.- She has regretted this event very much and is apologetic to staff  Patient/Family's Understanding of and Emotional Response to Diagnosis, Current Treatment, and Prognosis:  Patient understands now and much more alert and oriented since his sugar levels improved ( as per son)  Emotional Assessment Appearance:  Appears stated age Attitude/Demeanor/Rapport:    Affect (typically observed):  Unable to Assess Orientation:  Oriented to Self, Oriented to Situation, Oriented to Place Alcohol / Substance use:  Not Applicable Psych involvement (Current and /or in the community):  Yes (Comment)  Discharge Needs  Concerns to be addressed:  Discharge Planning Concerns Readmission within the last 30 days:  No Current discharge risk:  Cognitively Impaired, Lack of support system Barriers to Discharge:  Family Issues, Unsafe home situation   [name], LCSW 11/22/2018, 2:21 PM

## 2018-11-22 NOTE — Progress Notes (Signed)
Response from NP regarding patient's request for Ativan and Trazadone received, MD does not want to order sedatives at this time.  Patient informed and voiced understanding.  P.J. Linus Mako, RN

## 2018-11-22 NOTE — Progress Notes (Signed)
LCSW was able to contact patients son and patients wife. Patients wife reports she has been married to patient for 50 years and will not abandon pt but reports  she is exhausted and needs to get some help.She does admit to saying she was done with him to the ED nurse earlier today.  LCSW gave her the contact information for [facility] and that this facility could arrange to have a respite period for private pay. Patients wife was encouraged to call.    [employer] LCSW [phone]

## 2018-11-22 NOTE — NC FL2 (Deleted)
[place] LEVEL OF CARE SCREENING TOOL     IDENTIFICATION  Patient Name: Rick Cruz Birthdate: July 28, 1946 Sex: male Admission Date (Current Location): 11/21/2018  [facility] [place] and [place] Number:  [employer] and Address:  [place] [address] [place]      Provider Number: [id number]  Attending Physician Name and Address:  Barton Dubois, MD  Relative Name and Phone Number:     Dmarion Perfect [phone]  Current Level of Care: Hospital Recommended Level of Care: Memory Care Prior Approval Number:    Date Approved/Denied:   PASRR Number:    Discharge Plan: SNF/Memory care ALF    Current Diagnoses: Patient Active Problem List   Diagnosis Date Noted  . Hyperglycemia   . Altered mental status 11/21/2018  . OSA (obstructive sleep apnea)   . Chronic respiratory failure with hypercapnia ([place])   . Undifferentiated schizophrenia ([place])   . Uncontrolled type 2 diabetes mellitus with complication ([place])   . Anemia of chronic disease   . Paroxysmal atrial fibrillation (HCC)   . Hyponatremia 06/12/2016  . Aspiration pneumonia ([place]) 08/16/2015  . Obesity hypoventilation syndrome ([place]) 04/05/2015  . CSA (central sleep apnea) 04/05/2015  . Non compliance with medical treatment 04/05/2015  . Acute respiratory failure with hypoxia and hypercarbia (HCC)   . Chronic diastolic CHF (congestive heart failure) ([place]) 08/06/2014  . Recurrent falls 05/08/2014  . Ankle fracture, right 05/08/2014  . Ankle fracture, left 05/08/2014  . C. difficile colitis 01/04/2014  . Delirium 01/02/2014  . Acute encephalopathy 01/02/2014  . Morbid obesity ([place]) 06/17/2013  . Erosive esophagitis 04/22/2013  . Chronic respiratory failure 02 dep with hypercarbia 04/22/2013  . Type 2 diabetes mellitus without complication, with long-term current use of insulin ([place]) 04/21/2013  . Paranoid schizophrenia ([place]) 04/18/2013  . CAD (coronary artery disease)  04/18/2013  . DYSLIPIDEMIA 08/25/2008  . Obstructive sleep apnea 08/25/2008  . Essential hypertension 08/25/2008    Orientation RESPIRATION BLADDER Height & Weight     Self  O2- 3 litres Incontinent Weight: 274 lb (124.3 kg) Height:  6' (182.9 cm)  BEHAVIORAL SYMPTOMS/MOOD NEUROLOGICAL BOWEL NUTRITION STATUS      Incontinent  Diabetic  AMBULATORY STATUS COMMUNICATION OF NEEDS Skin   Extensive Assist Verbally Normal                       Personal Care Assistance Level of Assistance  Bathing, Feeding, Dressing, Total care Bathing Assistance: Limited assistance Feeding assistance: Independent Dressing Assistance: Limited assistance Total Care Assistance: Limited assistance   Functional Limitations Info  Sight, Hearing, Speech Sight Info: Impaired(Blind in right eye) Hearing Info: Adequate Speech Info: Adequate    SPECIAL CARE FACTORS FREQUENCY                       Contractures Contractures Info: Not present    Additional Factors Info  Allergies, Insulin Sliding Scale, Psychotropic               Current Medications (11/22/2018):  This is the current hospital active medication list Current Facility-Administered Medications  Medication Dose Route Frequency Provider Last Rate Last Dose  . acetaminophen (TYLENOL) tablet 650 mg  650 mg Oral Q6H PRN Lenore Cordia, MD   650 mg at 11/21/18 2305   Or  . acetaminophen (TYLENOL) suppository 650 mg  650 mg Rectal Q6H PRN Lenore Cordia, MD      .  albuterol (PROVENTIL) (2.5 MG/3ML) 0.083% nebulizer solution 2.5 mg  2.5 mg Nebulization Q6H PRN Zada Finders R, MD      . citalopram (CELEXA) tablet 40 mg  40 mg Oral Daily Patel, Vishal R, MD      . fluticasone furoate-vilanterol (BREO ELLIPTA) 100-25 MCG/INH 1 puff  1 puff Inhalation Daily Patel, Vishal R, MD       And  . umeclidinium bromide (INCRUSE ELLIPTA) 62.5 MCG/INH 1 puff  1 puff Inhalation Daily Patel, Vishal R, MD      . insulin aspart (novoLOG) injection  0-15 Units  0-15 Units Subcutaneous TID WC Barton Dubois, MD   15 Units at 11/22/18 1713  . insulin glargine (LANTUS) injection 30 Units  30 Units Subcutaneous BID Barton Dubois, MD   30 Units at 11/22/18 1215  . pantoprazole (PROTONIX) EC tablet 40 mg  40 mg Oral BID Lenore Cordia, MD   40 mg at 11/21/18 2307  . simvastatin (ZOCOR) tablet 40 mg  40 mg Oral QPM Lenore Cordia, MD   40 mg at 11/22/18 1713  . sodium chloride flush (NS) 0.9 % injection 3 mL  3 mL Intravenous Q12H Lenore Cordia, MD   3 mL at 11/21/18 2255     Discharge Medications: Please see discharge summary for a list of discharge medications.  Relevant Imaging Results:  Relevant Lab Results:   Additional Information SSN [id number]  [name], [place]

## 2018-11-22 NOTE — Progress Notes (Signed)
Patient with underlying schizophrenia and mild dementia; overall will be considered with impair competency to look after his care around the clock without assistance.  He has been abandoned by his wife and there is no other family member that can provide care around-the-clock for him.  Adult Protective Services and the clinical social worker are involved I will try to help with final disposition.  Patient is medically stable and otherwise ready to be discharged whenever his situation is further clarified. Will cancel discharge for now.  Barton Dubois MD [phone]

## 2018-11-22 NOTE — ED Notes (Signed)
Spoke with pt's wife who called apologizing for her earlier behavior.  States she just cannot deal with the patient and his needs at home.  The patient will improve while in the hospital and then declines after a few days.  She would like to speak with DSS and social worker to coordinate placement.  Has spoken with Claudine.

## 2018-11-22 NOTE — ED Notes (Signed)
Pt's wife called stating she received a call from the hospitalist regarding pt being discharged and discussed this with him.  After thinking about it she will not be coming to pick pt up as she is tired and needs a break.  She states she is leaving for their townhouse in [place] and will not be able to take the patient with her.  When expressed to her this could be considered abandonment, she states she does not care and her lawyer will be calling shortly to discuss this and then hung up on me after stating there will be no further discussion as she is finished with the pt.

## 2018-11-22 NOTE — ED Notes (Signed)
Attempted to contact pt's son.  Message left to return call.

## 2018-11-23 DIAGNOSIS — E1121 Type 2 diabetes mellitus with diabetic nephropathy: Secondary | ICD-10-CM

## 2018-11-23 DIAGNOSIS — R4182 Altered mental status, unspecified: Secondary | ICD-10-CM | POA: Diagnosis not present

## 2018-11-23 DIAGNOSIS — I10 Essential (primary) hypertension: Secondary | ICD-10-CM | POA: Diagnosis not present

## 2018-11-23 DIAGNOSIS — F200 Paranoid schizophrenia: Secondary | ICD-10-CM | POA: Diagnosis not present

## 2018-11-23 DIAGNOSIS — I5032 Chronic diastolic (congestive) heart failure: Secondary | ICD-10-CM | POA: Diagnosis not present

## 2018-11-23 DIAGNOSIS — E1122 Type 2 diabetes mellitus with diabetic chronic kidney disease: Secondary | ICD-10-CM

## 2018-11-23 DIAGNOSIS — N183 Chronic kidney disease, stage 3 (moderate): Secondary | ICD-10-CM

## 2018-11-23 LAB — GLUCOSE, CAPILLARY
GLUCOSE-CAPILLARY: 260 mg/dL — AB (ref 70–99)
Glucose-Capillary: 253 mg/dL — ABNORMAL HIGH (ref 70–99)
Glucose-Capillary: 231 mg/dL — ABNORMAL HIGH (ref 70–99)
Glucose-Capillary: 243 mg/dL — ABNORMAL HIGH (ref 70–99)

## 2018-11-23 MED ORDER — INSULIN ASPART 100 UNIT/ML ~~LOC~~ SOLN
6.0000 [IU] | Freq: Three times a day (TID) | SUBCUTANEOUS | Status: DC
  Administered 2018-11-24 – 2018-11-25 (×4): 6 [IU] via SUBCUTANEOUS

## 2018-11-23 MED ORDER — CLOTRIMAZOLE-BETAMETHASONE 1-0.05 % EX CREA
TOPICAL_CREAM | Freq: Two times a day (BID) | CUTANEOUS | Status: AC
  Administered 2018-11-23 – 2018-11-24 (×4): via TOPICAL
  Filled 2018-11-23: qty 15

## 2018-11-23 MED ORDER — INSULIN GLARGINE 100 UNIT/ML ~~LOC~~ SOLN
35.0000 [IU] | Freq: Two times a day (BID) | SUBCUTANEOUS | Status: DC
  Administered 2018-11-23 – 2018-11-25 (×4): 35 [IU] via SUBCUTANEOUS
  Filled 2018-11-23 (×6): qty 0.35

## 2018-11-23 MED ORDER — HYDROXYZINE HCL 25 MG PO TABS
25.0000 mg | ORAL_TABLET | Freq: Two times a day (BID) | ORAL | Status: DC | PRN
  Administered 2018-11-23 – 2018-11-25 (×3): 25 mg via ORAL
  Filled 2018-11-23 (×3): qty 1

## 2018-11-23 NOTE — Progress Notes (Signed)
LCSW sent out patient info to the following facilities. The patients wife will review all care options for her husband. Weekday LCSW will review and discuss bed offers or possible home health options.  [employer] LCSW [phone]

## 2018-11-23 NOTE — NC FL2 (Signed)
[place] LEVEL OF CARE SCREENING TOOL     IDENTIFICATION  Patient Name: Rick Cruz Birthdate: Nov 10, 1945 Sex: male Admission Date (Current Location): 11/21/2018  [facility] and [place] Number:  [employer] and Address:  [place] [address] [place]      Provider Number: [id number]  Attending Physician Name and Address:  Barton Dubois, MD  Relative Name and Phone Number:       Current Level of Care: Hospital Recommended Level of Care: Memory Care Prior Approval Number:    Date Approved/Denied:   PASRR Number: [id number]  Discharge Plan: SNF    Current Diagnoses: Patient Active Problem List   Diagnosis Date Noted  . Hyperglycemia   . Altered mental status 11/21/2018  . OSA (obstructive sleep apnea)   . Chronic respiratory failure with hypercapnia ([place])   . Undifferentiated schizophrenia ([place])   . Uncontrolled type 2 diabetes mellitus with complication ([place])   . Anemia of chronic disease   . Paroxysmal atrial fibrillation (HCC)   . Hyponatremia 06/12/2016  . Aspiration pneumonia ([place]) 08/16/2015  . Obesity hypoventilation syndrome ([place]) 04/05/2015  . CSA (central sleep apnea) 04/05/2015  . Non compliance with medical treatment 04/05/2015  . Acute respiratory failure with hypoxia and hypercarbia (HCC)   . Chronic diastolic CHF (congestive heart failure) ([place]) 08/06/2014  . Recurrent falls 05/08/2014  . Ankle fracture, right 05/08/2014  . Ankle fracture, left 05/08/2014  . C. difficile colitis 01/04/2014  . Delirium 01/02/2014  . Acute encephalopathy 01/02/2014  . Morbid obesity ([place]) 06/17/2013  . Erosive esophagitis 04/22/2013  . Chronic respiratory failure 02 dep with hypercarbia 04/22/2013  . Type 2 diabetes mellitus without complication, with long-term current use of insulin ([place]) 04/21/2013  . Paranoid schizophrenia ([place]) 04/18/2013  . CAD (coronary artery disease) 04/18/2013  . DYSLIPIDEMIA  08/25/2008  . Obstructive sleep apnea 08/25/2008  . Essential hypertension 08/25/2008    Orientation RESPIRATION BLADDER Height & Weight     Self  O2 Incontinent Weight: 274 lb 0.5 oz (124.3 kg) Height:  6' (182.9 cm)  BEHAVIORAL SYMPTOMS/MOOD NEUROLOGICAL BOWEL NUTRITION STATUS      Incontinent    AMBULATORY STATUS COMMUNICATION OF NEEDS Skin   Extensive Assist Verbally Normal                       Personal Care Assistance Level of Assistance  Bathing, Feeding, Dressing, Total care Bathing Assistance: Limited assistance Feeding assistance: Independent Dressing Assistance: Limited assistance Total Care Assistance: Limited assistance   Functional Limitations Info  Sight, Hearing, Speech Sight Info: 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 acetaminophen (TYLENOL) tablet 650 mg  650 mg Oral Q6H PRN Lenore Cordia, MD   650 mg at 11/22/18 2009   Or  . acetaminophen (TYLENOL) suppository 650 mg  650 mg Rectal Q6H PRN Zada Finders R, MD      . albuterol (PROVENTIL) (2.5 MG/3ML) 0.083%  nebulizer solution 2.5 mg  2.5 mg Nebulization Q6H PRN Zada Finders R, MD      . citalopram (CELEXA) tablet 40 mg  40 mg Oral Daily Zada Finders R, MD   40 mg at 11/23/18 0830  . clotrimazole-betamethasone (LOTRISONE) cream   Topical BID Darrick Meigs, Gagan S, MD      . fluticasone furoate-vilanterol (BREO ELLIPTA) 100-25 MCG/INH 1 puff  1 puff Inhalation Daily Patel, Vishal R, MD       And  . umeclidinium bromide (INCRUSE ELLIPTA) 62.5 MCG/INH 1 puff  1 puff Inhalation  Daily Patel, Vishal R, MD      . hydrOXYzine (ATARAX/VISTARIL) tablet 25 mg  25 mg Oral Q12H PRN Barton Dubois, MD   25 mg at 11/23/18 0829  . insulin aspart (novoLOG) injection 0-15 Units  0-15 Units Subcutaneous TID WC Barton Dubois, MD   8 Units at 11/23/18 0830  . insulin glargine (LANTUS) injection 30 Units  30 Units Subcutaneous BID Barton Dubois, MD   30 Units at 11/22/18 2237  . pantoprazole (PROTONIX) EC tablet 40 mg  40 mg Oral BID Lenore Cordia, MD   40 mg at 11/23/18 0830  . simvastatin (ZOCOR) tablet 40 mg  40 mg Oral QPM Lenore Cordia, MD   40 mg at 11/22/18 1713  . sodium chloride flush (NS) 0.9 % injection 3 mL  3 mL Intravenous Q12H Lenore Cordia, MD   3 mL at 11/23/18 0830     Discharge Medications: Please see discharge summary for a list of discharge medications.  Relevant Imaging Results:  Relevant Lab Results:   Additional Information SSN [id number]  [name], [place]

## 2018-11-23 NOTE — Progress Notes (Signed)
RN paged Dr. Darrick Meigs to request cream for patient's itching back, awaiting response.  P.J. Linus Mako, RN

## 2018-11-23 NOTE — Progress Notes (Signed)
PROGRESS NOTE    Rick Cruz  WUX:[id number] DOB: 1945-11-25 DOA: 11/21/2018 PCP: [name], MD     Brief Narrative:  73 y.o.malewith medical history significant forCOPD, chronic diastolic CHF, hypertension, hyperlipidemia, insulin-dependent type 2 diabetes, OSA/OHS not on CPAP, atrial fibrillation on Eliquis, and schizophrenia presents to the hospital with altered mental status. Patient is unable to provide significant history but answers limited simple questions. 24 of history is obtained from wife by phone, EDP, and chart review.   Per wife patient has significant change in behavior since unfortunate loss of their son in the last year. She states patient has not taking medications properly including his insulin and is unsure when he last uses insulin. She states that he has been ordering a lot of pharmaceutical supplements online which she is not sure. Last night he became very agitated with increased confusion overnight. EMS were called on prior ED documentation had malodorous urine and feces on body. Reportedly in A. fib on monitor and CBG machine read "high."   Assessment & Plan: 1-metabolic encephalopathy -in the setting of hyperglycemia -acutely resolved with insulin drip -mentation back to baseline now -no infections appreciated -CT head negative for acute intracranial abnormalities  -continue controlling CBG's.  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  -CBG's still reaching 200-230 range -will increase lantus to 35 units BID and add meal coverage   3-PAF -continue eliquis -rate has remained controlled  4-chronic diastolic HF -compensated  -continue to follow daily weights and strict I's and O's -low sodium diet encouraged  5-class 2 obesity -low calorie diet discussed with patient   6-HTN -continue low sodium diet -not using any antihypertensive agent  7-schizophrenia/depression -continue celexa -needs outpatient  follow up with psychiatry service -no SI or hallucinations currently  8-CKD stage 3 -stable and at baseline -will monitor trend   DVT prophylaxis: chronically on Elqiuis  Code Status: Full Family Communication: no family at bedside  Disposition Plan: patient physically unable to take care of himself independently. Continue adjusting insulin therapy. Follow PT rec's.   Consultants:   None   Procedures:   See below for x-ray reports   Antimicrobials:  Anti-infectives (From admission, onward)   None       Subjective: Afebrile, no CP, no SOB, no nausea, no vomiting. CBG's still elevated (even much better controlled in comparison to admission). Expressing being overall weak and deconditioned.  Objective: Vitals:   11/23/18 0504 11/23/18 0505 11/23/18 0958 11/23/18 1334  BP:  114/77  116/70  Pulse: 92 93  97  Resp:  18  20  Temp:  97.8 F (36.6 C)  (!) 97.5 F (36.4 C)  TempSrc:  Oral  Oral  SpO2: 98% 99% 98% 100%  Weight:      Height:        Intake/Output Summary (Last 24 hours) at 11/23/2018 1717 Last data filed at 11/23/2018 1408 Gross per 24 hour  Intake 1200 ml  Output 2800 ml  Net -1600 ml   Filed Weights   11/21/18 1643 11/23/18 0415  Weight: 124.3 kg 124.3 kg    Examination: General exam: Alert, awake, oriented x 3; no nausea, no vomiting. Patient afebrile and in no major distress.  Respiratory system: good air movement bilaterally; positive scattered rhonchi. Normal resp effort. Cardiovascular system:Rate controlled, no rubs, no gallops Gastrointestinal system: Abdomen is obese, nondistended, soft and nontender. No organomegaly or masses felt. Normal bowel sounds heard. Central nervous system: Alert and oriented. No focal neurological deficits. Extremities:  No cyanosis, no clubbing  Skin: No rashes, lesions or ulcers Psychiatry: Mood & affect appropriate.     Data Reviewed: I have personally reviewed following labs and imaging  studies  CBC: Recent Labs  Lab 11/21/18 1601 11/22/18 0609  WBC 14.0* 8.5  NEUTROABS 12.6*  --   HGB 12.0* 12.2*  HCT 39.9 41.3  MCV 84.7 87.9  PLT 230 341   Basic Metabolic Panel: Recent Labs  Lab 11/21/18 1656 11/22/18 0609  NA 132* 134*  K 4.5 4.2  CL 91* 96*  CO2 29 26  GLUCOSE 500* 306*  BUN 19 21  CREATININE 1.28* 1.46*  CALCIUM 8.1* 7.9*   GFR: Estimated Creatinine Clearance: 62.3 mL/min (A) (by C-G formula based on SCr of 1.46 mg/dL (H)).   Liver Function Tests: Recent Labs  Lab 11/21/18 1656  AST 18  ALT 16  ALKPHOS 110  BILITOT 0.8  PROT 6.7  ALBUMIN 3.6   Cardiac Enzymes: Recent Labs  Lab 11/21/18 1656  TROPONINI <0.03   HbA1C: Recent Labs    11/22/18 0609  HGBA1C 11.4*   CBG: Recent Labs  Lab 11/22/18 1559 11/22/18 2123 11/23/18 0725 11/23/18 1122 11/23/18 1604  GLUCAP 369* 255* 260* 253* 231*   Urine analysis:    Component Value Date/Time   COLORURINE YELLOW 11/21/2018 1557   APPEARANCEUR CLEAR 11/21/2018 1557   LABSPEC 1.024 11/21/2018 1557   PHURINE 5.0 11/21/2018 1557   GLUCOSEU >=500 (A) 11/21/2018 1557   HGBUR NEGATIVE 11/21/2018 1557   BILIRUBINUR NEGATIVE 11/21/2018 1557   KETONESUR 20 (A) 11/21/2018 1557   PROTEINUR NEGATIVE 11/21/2018 1557   UROBILINOGEN 1.0 12/05/2014 1519   NITRITE NEGATIVE 11/21/2018 1557   LEUKOCYTESUR NEGATIVE 11/21/2018 1557   Radiology Studies: Dg Chest 1 View  Result Date: 11/21/2018 CLINICAL DATA:  Altered mental status EXAM: CHEST  1 VIEW COMPARISON:  01/13/2018 chest radiograph. FINDINGS: Stable cardiomediastinal silhouette with mild cardiomegaly. No pneumothorax. Chronic mild diffuse pleural thickening, unchanged. No convincing pleural effusion. Cephalization of the pulmonary vasculature without overt pulmonary edema. No acute consolidative airspace disease. IMPRESSION: Stable mild cardiomegaly without overt pulmonary edema. Stable chronic mild diffuse pleural thickening. No acute  pulmonary disease. Electronically Signed   By: Ilona Sorrel M.D.   On: 11/21/2018 17:37   Ct Head Wo Contrast  Result Date: 11/22/2018 CLINICAL DATA:  Altered consciousness. Confusion over the past 12 hours. EXAM: CT HEAD WITHOUT CONTRAST TECHNIQUE: Contiguous axial images were obtained from the base of the skull through the vertex without intravenous contrast. COMPARISON:  01/13/2018 FINDINGS: Brain: Cerebral and cerebellar volume loss. Moderate to marked low density in the periventricular white matter likely related to small vessel disease. No mass lesion, hemorrhage, hydrocephalus, acute infarct, intra-axial, or extra-axial fluid collection. Vascular: No hyperdense vessel or unexpected calcification. Skull: No significant soft tissue swelling. Minimal motion degradation. Normal skull. Sinuses/Orbits: Normal imaged portions of the orbits and globes. Clear paranasal sinuses and mastoid air cells. Other: None. IMPRESSION: 1. No acute intracranial abnormality. 2. Cerebral atrophy and small vessel ischemic change. 3. Minimal motion degradation. Electronically Signed   By: Abigail Miyamoto M.D.   On: 11/22/2018 15:41    Scheduled Meds: . citalopram  40 mg Oral Daily  . clotrimazole-betamethasone   Topical BID  . fluticasone furoate-vilanterol  1 puff Inhalation Daily   And  . umeclidinium bromide  1 puff Inhalation Daily  . insulin aspart  0-15 Units Subcutaneous TID WC  . insulin glargine  30 Units Subcutaneous BID  .  pantoprazole  40 mg Oral BID  . simvastatin  40 mg Oral QPM  . sodium chloride flush  3 mL Intravenous Q12H   Continuous Infusions:   LOS: 0 days    Time spent: 25 minutes    Barton Dubois, MD [facility] Pager [phone]  11/23/2018, 5:17 PM

## 2018-11-23 NOTE — Progress Notes (Signed)
LCSW called patients wife to discuss and receive consent to send patient information via the HUB to SNF for possible future temporary placement. Consent from wife was given.  Ms Delmundo reports she has been vomiting and experiencing waves of nausea since very early this morning. She is not abandoning her husband she is just very weak and sick today. She reported she is aware there is going to be a meeting with DSS and SW at the hospital on Monday and is very happy for the support and help in the care for her husband.   LCSW will complete documentation and send out patient information to assist with future SNF/Respite placement.  [employer] LCSW [phone]

## 2018-11-23 NOTE — Progress Notes (Signed)
RN paged Jeannette Corpus, NP because patient is inquiring about Eliquis dose, takes bid at home, awaiting response.  P.J. Linus Mako, RN

## 2018-11-23 NOTE — Progress Notes (Signed)
LCSW called and spoke to RN Haynes Dage Let her know that both wife and mother are vomiting and unable to provide care for patient and that she is agreeable to work with SW to get some extra help and possible SNF for patient. Patient is to remain in hospital until Monday   [place] LCSW [phone]

## 2018-11-24 DIAGNOSIS — I5032 Chronic diastolic (congestive) heart failure: Secondary | ICD-10-CM | POA: Diagnosis not present

## 2018-11-24 DIAGNOSIS — R4182 Altered mental status, unspecified: Secondary | ICD-10-CM | POA: Diagnosis not present

## 2018-11-24 DIAGNOSIS — I10 Essential (primary) hypertension: Secondary | ICD-10-CM | POA: Diagnosis not present

## 2018-11-24 DIAGNOSIS — F200 Paranoid schizophrenia: Secondary | ICD-10-CM | POA: Diagnosis not present

## 2018-11-24 LAB — GLUCOSE, CAPILLARY
GLUCOSE-CAPILLARY: 240 mg/dL — AB (ref 70–99)
Glucose-Capillary: 194 mg/dL — ABNORMAL HIGH (ref 70–99)
Glucose-Capillary: 157 mg/dL — ABNORMAL HIGH (ref 70–99)
Glucose-Capillary: 199 mg/dL — ABNORMAL HIGH (ref 70–99)

## 2018-11-24 MED ORDER — INSULIN GLARGINE 100 UNIT/ML SOLOSTAR PEN: 35.0000 [IU] | PEN_INJECTOR | Freq: Two times a day (BID) | SUBCUTANEOUS | 3 refills | Status: DC

## 2018-11-24 MED ORDER — APIXABAN 5 MG PO TABS
5.0000 mg | ORAL_TABLET | Freq: Two times a day (BID) | ORAL | Status: DC
  Administered 2018-11-24 – 2018-11-25 (×3): 5 mg via ORAL
  Filled 2018-11-24 (×3): qty 1

## 2018-11-24 NOTE — Evaluation (Signed)
Physical Therapy Evaluation Patient Details Name: Rick Cruz MRN: [id number] DOB: 05-19-1946 Today's Date: 11/24/2018   History of Present Illness  Rick Cruz is a 73 y.o. male with medical history significant for COPD, chronic diastolic CHF, hypertension, hyperlipidemia, insulin-dependent type 2 diabetes, OSA/OHS not on CPAP, atrial fibrillation on Eliquis, and schizophrenia presents to the hospital with altered mental status.  Patient is unable to provide significant history but answers limited simple questions.  46 of history is obtained from wife by phone, EDP, and chart review.      Clinical Impression  Patient functioning near baseline for functional mobility and gait, ambulates with wide base of support and excessive BLE external rotation which is baseline per patient, had to take sitting rest break before ambulating back to room and on 2 LPM with SpO2 at 94% during ambulation.  Patient tolerated sitting up in chair after therapy.  Patient will benefit from continued physical therapy in hospital and recommended venue below to increase strength, balance, endurance for safe ADLs and gait.     Follow Up Recommendations Home health PT;Supervision - Intermittent    Equipment Recommendations  None recommended by PT    Recommendations for Other Services       Precautions / Restrictions Precautions Precautions: Fall Restrictions Weight Bearing Restrictions: No      Mobility  Bed Mobility Overal bed mobility: Modified Independent             General bed mobility comments: increased time  Transfers Overall transfer level: Needs assistance Equipment used: None Transfers: Sit to/from Stand;Stand Pivot Transfers Sit to Stand: Min guard;Supervision Stand pivot transfers: Supervision;Min guard       General transfer comment: slightly labored movement  Ambulation/Gait Ambulation/Gait assistance: Supervision;Min guard Gait Distance (Feet): 75 Feet Assistive  device: None Gait Pattern/deviations: Decreased step length - right;Decreased step length - left;Decreased stride length Gait velocity: decreased   General Gait Details: slightly labored unsteady cadence with excessive BLE external rotation, occasionally reaches for nearby objects for support, no loss of balance  Stairs            Wheelchair Mobility    Modified Rankin (Stroke Patients Only)       Balance Overall balance assessment: Mild deficits observed, not formally tested                                           Pertinent Vitals/Pain Pain Assessment: No/denies pain    Home Living Family/patient expects to be discharged to:: Private residence Living Arrangements: Spouse/significant other Available Help at Discharge: Family Type of Home: House Home Access: Ramped entrance     Home Layout: One level Home Equipment: Environmental consultant - 2 wheels;Walker - 4 wheels;Other (comment) Additional Comments: lift chair    Prior Function Level of Independence: Independent with assistive device(s)         Comments: household and short distanced community ambulator with Rollator/RW PRN     Hand Dominance   Dominant Hand: Right    Extremity/Trunk Assessment   Upper Extremity Assessment Upper Extremity Assessment: Overall WFL for tasks assessed    Lower Extremity Assessment Lower Extremity Assessment: Generalized weakness    Cervical / Trunk Assessment Cervical / Trunk Assessment: Normal  Communication   Communication: No difficulties  Cognition Arousal/Alertness: Awake/alert Behavior During Therapy: WFL for tasks assessed/performed Overall Cognitive Status: Within Functional Limits for tasks assessed  General Comments      Exercises     Assessment/Plan    PT Assessment Patient needs continued PT services  PT Problem List Decreased strength;Decreased activity tolerance;Decreased  balance;Decreased mobility       PT Treatment Interventions Stair training;Gait training;Functional mobility training;Therapeutic activities;Therapeutic exercise;Patient/family education    PT Goals (Current goals can be found in the Care Plan section)  Acute Rehab PT Goals Patient Stated Goal: return home PT Goal Formulation: With patient Time For Goal Achievement: 11/28/18 Potential to Achieve Goals: Good    Frequency Min 3X/week   Barriers to discharge        Co-evaluation               AM-PAC PT "6 Clicks" Mobility  Outcome Measure Help needed turning from your back to your side while in a flat bed without using bedrails?: None Help needed moving from lying on your back to sitting on the side of a flat bed without using bedrails?: None Help needed moving to and from a bed to a chair (including a wheelchair)?: A Little Help needed standing up from a chair using your arms (e.g., wheelchair or bedside chair)?: None Help needed to walk in hospital room?: A Little Help needed climbing 3-5 steps with a railing? : A Little 6 Click Score: 21    End of Session Equipment Utilized During Treatment: Gait belt Activity Tolerance: Patient tolerated treatment well;Patient limited by fatigue Patient left: in chair;with call bell/phone within reach;with chair alarm set Nurse Communication: Mobility status PT Visit Diagnosis: Unsteadiness on feet (R26.81);Other abnormalities of gait and mobility (R26.89);Muscle weakness (generalized) (M62.81)    Time: 1540-0867 PT Time Calculation (min) (ACUTE ONLY): 29 min   Charges:   PT Evaluation $PT Eval Moderate Complexity: 1 Mod PT Treatments $Therapeutic Activity: 23-37 mins        2:49 PM, 11/24/18 Lonell Grandchild, MPT Physical Therapist with [facility] [phone] office [phone] mobile phone

## 2018-11-24 NOTE — Discharge Summary (Signed)
Physician Discharge Summary  Rick Cruz WSF:[id number] DOB: 03/23/1946 DOA: 11/21/2018   PCP: [name], MD   Admit date: 11/21/2018 Discharge date: 11/24/2018   Time spent: 30 minutes   Recommendations for Outpatient Follow-up:  1. Repeat basic metabolic panel to follow electrolytes and renal function 2. Reassess blood pressure and determine the need of antihypertensive medications 3. Close follow-up the patient CBGs/A1c with further adjustment to hypoglycemic regimen as needed. 4. Arrange outpatient follow-up with psychiatry service.     Discharge Diagnoses:  Principal Problem:   Altered mental status Active Problems:   Essential hypertension   Paranoid schizophrenia ([place])   Type 2 diabetes mellitus without complication, with long-term current use of insulin (HCC)   Chronic diastolic CHF (congestive heart failure) (HCC)   OSA (obstructive sleep apnea)   Paroxysmal atrial fibrillation ([place])     Discharge Condition: Stable and with mentation back to baseline.  Patient instructed to follow-up with PCP in 1 week and to have close follow-up with his endocrinologist in 10 days. Patient discharge home with [facility] servcies.    Diet recommendation: Low calorie diet and modify carbohydrates.      Filed Weights    11/21/18 1643  Weight: 124.3 kg      History of present illness:  73 y.o. male with medical history significant for COPD, chronic diastolic CHF, hypertension, hyperlipidemia, insulin-dependent type 2 diabetes, OSA/OHS not on CPAP, atrial fibrillation on Eliquis, and schizophrenia presents to the hospital with altered mental status.  Patient is unable to provide significant history but answers limited simple questions.  56 of history is obtained from wife by phone, EDP, and chart review.     Per wife patient has significant change in behavior since unfortunate loss of their son in the last year.  She states patient has not taking medications properly including his insulin and  is unsure when he last uses insulin.  She states that he has been ordering a lot of pharmaceutical supplements online which she is not sure.  Last night he became very agitated with increased confusion overnight.  EMS were called on prior ED documentation had malodorous urine and feces on body.  Reportedly in A. fib on monitor and CBG machine read "high."   ED Course:  In the ED, initial vitals show BP 140/115, pulse 120, RR 22, temp 99.1 Fahrenheit, SPO2 94% on 3 L O2 via [place].   Labs are notable for serum glucose 500, sodium 132, potassium 4.5, bicarb 29, BUN 19, creatinine 1.28, anion gap 12, AST 18, ALT 16, T bili 0.8, alk phos 110, troponin I <0.03, WBC 14, hemoglobin 12, platelets 230, lactic acid 1.8, urinalysis with 20 ketones, negative nitrites, negative leukocytes, no bacteria on microscopy.  Blood ethanol level <10.   Patient was noted to be agitated and combative.  He was given IV Ativan and Geodon and placed in soft restraints.  He was given 1 L normal saline and started on an insulin infusion. The hospitalist service was consulted to place in observation for further evaluation and/management of hyperglycemia and altered mental status.     Hospital Course:  Altered mental status: Metabolic encephalopathy -In the setting of hyperglycemia -Orders underlying conditions that can be playing a role in his altered mentation includes untreated schizophrenia, untreated obstructive sleep apnea and polypharmacy. -After controlling his blood sugar patient mentation is back to baseline and he is in no acute distress.  Able to follow commands properly and not demonstrating any signs of acute neurologic process. -  There was no obvious infections sources appreciated on work-up. -Patient expressed some difficulty with lack of supplies to properly control his blood sugar. -Actively followed by endocrinologist as an outpatient. -Will recommend outpatient follow-up with psychiatry service.   Uncontrolled  type 2 diabetes with hyperglycemia -Resume home metformin and discharge on Lantus 35 units twice a day along with NovoLog 3 times a day with meals following the sliding scale. -Close follow-up with endocrinology service as an outpatient Supplies to be able to inject his insulin as instructed has been provided at discharge Patient advised to follow modified carbohydrate diet.   Paroxysmal atrial fibrillation -Stable and well-controlled -Resume Eliquis   Chronic diastolic heart failure -Compensated -Continue low-sodium diet and follow daily weights. -Patient instructed to maintain adequate hydration.   Chronic respiratory failure due to COPD -Stable and well-controlled -Continue 3 L oxygen supplementation as previously prescribed -Continue home inhalers and nebulizer therapy.   Hypertension -Continue low-sodium diet -Patient currently no using any antihypertensive medication -Outpatient follow-up on his vital signs/blood pressure with further decision to start antihypertensive medication as needed.   Schizophrenia -Will continue Celexa -Patient needs outpatient follow-up by psychiatry service.   Class II obesity -Body mass index is 37.16 kg/m. -Low calorie diet, portion control and increase physical activity has been discussed with patient.   Procedures:  See below for x-ray reports.   Consultations:  None   Discharge Exam:     Vitals:    11/22/18 0805 11/22/18 0807  BP:   106/63  Pulse: (!) 102 (!) 102  Resp:      Temp:      SpO2: 96% 97%      General: Alert, awake and oriented x3; following commands appropriately and in no acute distress.  Patient is no longer agitated or combative.  Denies any chest pain, shortness of breath, nausea, vomiting, abdominal pain or any other complaints.  He is afebrile. Cardiovascular: S1 and S2, no rubs, no gallops, no JVD appreciated on exam. Respiratory: Positive scattered rhonchi, no wheezing, no crackles, normal respiratory  effort.  Wearing his chronic 3 L nasal cannula supplementation. Abdomen: Obese, soft, nontender, nondistended, positive bowel sounds Extremities: Trace edema bilaterally, no cyanosis, no clubbing.   Discharge Instructions         Discharge Instructions     (HEART FAILURE PATIENTS) Call MD:  Anytime you have any of the following symptoms: 1) 3 pound weight gain in 24 hours or 5 pounds in 1 week 2) shortness of breath, with or without a dry hacking cough 3) swelling in the hands, feet or stomach 4) if you have to sleep on extra pillows at night in order to breathe.   Complete by:  As directed      Diet - low sodium heart healthy   Complete by:  As directed      Diet Carb Modified   Complete by:  As directed      Discharge instructions   Complete by:  As directed      Follow modified carbohydrate diet Take medications as prescribed Keep yourself well-hydrated Follow-up with primary care doctor in 1 week and arrange follow-up with endocrinology service in the next 10 days.            Allergies as of 11/22/2018       Reactions    Phenergan [promethazine Hcl] Other (See Comments)    Becomes very confused and aggressive    Haldol [haloperidol Lactate] Other (See Comments)    Shaking  Metformin And Related Diarrhea           Medication List     STOP taking these medications   CINNAMON PO    Saw Palmetto 250 MG Caps    Trulicity 5.39 JQ/7.3AL Sopn Generic drug:  Dulaglutide       TAKE these medications   acetaminophen 500 MG tablet Commonly known as:  TYLENOL Take 1,000 mg by mouth 2 (two) times daily as needed for mild pain or headache.    albuterol 108 (90 Base) MCG/ACT inhaler Commonly known as:  PROVENTIL HFA;VENTOLIN HFA Inhale 2 puffs into the lungs every 6 (six) hours as needed for wheezing or shortness of breath.    albuterol (2.5 MG/3ML) 0.083% nebulizer solution Commonly known as:  PROVENTIL Take 3 mLs (2.5 mg total) by nebulization every 6 (six) hours as  needed for wheezing or shortness of breath.    apixaban 5 MG Tabs tablet Commonly known as:  ELIQUIS Take 1 tablet (5 mg total) by mouth 2 (two) times daily.    cholecalciferol 1000 units tablet Commonly known as:  VITAMIN D Take 2,000 Units by mouth daily.    citalopram 40 MG tablet Commonly known as:  CELEXA Take 40 mg by mouth daily.    diclofenac sodium 1 % Gel Commonly known as:  VOLTAREN Apply 1 application topically 3 (three) times daily. For neck pain    EYE VITAMINS PO Take 2 capsules by mouth 2 (two) times daily. PerserVision    CENTRUM SILVER 50+MEN PO Take 1 tablet by mouth daily.    gabapentin 300 MG capsule Commonly known as:  NEURONTIN Take 1 capsule (300 mg total) by mouth 3 (three) times daily. What changed:  when to take this    GUAIFENESIN PO Take 2 tablets by mouth 2 (two) times daily. '400mg'$  tablets, takes 2 tablets twice daily.    hydrOXYzine 25 MG tablet Commonly known as:  ATARAX/VISTARIL Take 1 tablet (25 mg total) by mouth every 12 (twelve) hours as needed for itching. What changed:  when to take this    insulin aspart 100 UNIT/ML FlexPen Commonly known as:  NovoLOG FlexPen Inject 15-25 Units into the skin 3 (three) times daily with meals. <149 =15 units, 150-180 = 17 units, 181-210= 19 units, 211-240= 21 units, 241-270= 23 units, >271= 25 units What changed:  how much to take    Insulin Glargine 100 UNIT/ML Solostar Pen Commonly known as:  Lantus SoloStar Inject 30 Units into the skin 2 (two) times daily. Per endocrine visit on 07/28/18 What changed:    how much to take  when to take this    metFORMIN 750 MG 24 hr tablet Commonly known as:  GLUCOPHAGE-XR Take 750 mg by mouth daily with breakfast.    NON FORMULARY Take 1 capsule by mouth 2 (two) times daily. Omega XL OTC    nystatin-triamcinolone ointment Commonly known as:  MYCOLOG Apply 1 application topically 2 (two) times daily. Apply to both feet twice daily until scales are  gone    pantoprazole 40 MG tablet Commonly known as:  PROTONIX Take 40 mg by mouth 2 (two) times daily.    Pen Needles 31G X 8 MM Misc Use to inject insulin as instructed    simvastatin 40 MG tablet Commonly known as:  ZOCOR Take 40 mg by mouth every evening.    traZODone 100 MG tablet Commonly known as:  DESYREL Take 100 mg by mouth at bedtime.    Trelegy Ellipta 100-62.5-25 MCG/INH  Aepb Generic drug:  Fluticasone-Umeclidin-Vilant Inhale 1 puff into the lungs daily. Samples given 10/01/18 by Dr. Luan Pulling              Allergies  Allergen Reactions  . Phenergan [Promethazine Hcl] Other (See Comments)      Becomes very confused and aggressive  . Haldol [Haloperidol Lactate] Other (See Comments)      Shaking   . Metformin And Related Diarrhea       Follow-up Information     [name], MD. Schedule an appointment as soon as possible for a visit in 1 week(s).   Specialty:  Internal Medicine Contact information: [place] [address] [phone]                 The results of significant diagnostics from this hospitalization (including imaging, microbiology, ancillary and laboratory) are listed below for reference.     Significant Diagnostic Studies:  Imaging Results  Dg Chest 1 View   Result Date: 11/21/2018 CLINICAL DATA:  Altered mental status EXAM: CHEST  1 VIEW COMPARISON:  01/13/2018 chest radiograph. FINDINGS: Stable cardiomediastinal silhouette with mild cardiomegaly. No pneumothorax. Chronic mild diffuse pleural thickening, unchanged. No convincing pleural effusion. Cephalization of the pulmonary vasculature without overt pulmonary edema. No acute consolidative airspace disease. IMPRESSION: Stable mild cardiomegaly without overt pulmonary edema. Stable chronic mild diffuse pleural thickening. No acute pulmonary disease. Electronically Signed   By: Ilona Sorrel M.D.   On: 11/21/2018 17:37     Labs: Basic Metabolic Panel: Last  Labs       Recent Labs  Lab 11/21/18 1656 11/22/18 0609  NA 132* 134*  K 4.5 4.2  CL 91* 96*  CO2 29 26  GLUCOSE 500* 306*  BUN 19 21  CREATININE 1.28* 1.46*  CALCIUM 8.1* 7.9*      Liver Function Tests: Last Labs      Recent Labs  Lab 11/21/18 1656  AST 18  ALT 16  ALKPHOS 110  BILITOT 0.8  PROT 6.7  ALBUMIN 3.6      CBC: Last Labs       Recent Labs  Lab 11/21/18 1601 11/22/18 0609  WBC 14.0* 8.5  NEUTROABS 12.6*  --   HGB 12.0* 12.2*  HCT 39.9 41.3  MCV 84.7 87.9  PLT 230 241      Cardiac Enzymes: Last Labs      Recent Labs  Lab 11/21/18 1656  TROPONINI <0.03      BNP: BNP (last 3 results) Recent Labs (within last 365 days)      Recent Labs    01/13/18 1852 11/21/18 1601  BNP 160.0* 29.0      CBG: Last Labs          Recent Labs  Lab 11/21/18 1903 11/21/18 2021 11/21/18 2151 11/22/18 0346 11/22/18 0811  GLUCAP 459* 198* 159* 322* 240*        Signed:   Barton Dubois MD.  [facility] 11/22/2018, 11:21 AM

## 2018-11-24 NOTE — Plan of Care (Signed)
  Problem: Acute Rehab PT Goals(only PT should resolve) Goal: Pt Will Go Supine/Side To Sit Outcome: Progressing Flowsheets (Taken 11/24/2018 1451) Pt will go Supine/Side to Sit: Independently Goal: Patient Will Transfer Sit To/From Stand Outcome: Progressing Flowsheets (Taken 11/24/2018 1451) Patient will transfer sit to/from stand: with supervision Goal: Pt Will Transfer Bed To Chair/Chair To Bed Outcome: Progressing Flowsheets (Taken 11/24/2018 1451) Pt will Transfer Bed to Chair/Chair to Bed: with supervision Goal: Pt Will Ambulate Outcome: Progressing Flowsheets (Taken 11/24/2018 1451) Pt will Ambulate: 100 feet; with supervision Note:  Without AD if possible   2:52 PM, 11/24/18 Lonell Grandchild, MPT Physical Therapist with [facility] 336 [phone] office [phone] mobile phone

## 2018-11-24 NOTE — Care Management Note (Addendum)
Case Management Note  Patient Details  Name: Rick Cruz MRN: [id number] Date of Birth: 01/06/1946  Subjective/Objective:       Patient is now alert and oriented. He has been seen by Billey Co of DSS and she reports patient has capacity to make his own decisions. Patient reports to this CM that he wishes to go home with home health (elects [facility]).   This CM has spoke with patient's wife, several times today.  Have called patient's mother as she has left me a VM, no answer. Have called patient's son , no answer.   We have discussed private pay options, which is irrelevant at this point as patient has been deemed to have capacity and elects to go home with home health. Billey Co reports that she will follow up with patient at home to assess home situation.   Discussed with patient, he is going to call his son for a ride home.   Patient has been seen by PT and has been recommended to go home with home health. He has a rollator and RW at home as well as a WC. Also has a ramped entrance and lift chair.        Action/Plan: Vaughan Basta of [facility] notified and will obtain orders via Epic. Attending updated. Bedside RN updated.   ADDENDUM: 1537: Discussed with patient's mother. She reports she is sick with a GI bug as well patient's wife and son. Mother states patient can not come home to any of three house due to this. Discussed with her DSS conclusions that patient has capacity and wants to come home. She plans to call patient and try to convince him to go, she is aware that patient will be private paying to be at a facility as recommendations were for home health and not SNF.   Patient's mother and wife are aware that patient is medically ready to discharge.     Expected Discharge Date:  11/22/18               Expected Discharge Plan:  Skilled Nursing Facility  In-House Referral:  Clinical Social Work  Discharge planning Services  CM Consult  Post Acute Care Choice:  Home  Health Choice offered to:  Patient, Spouse  DME Arranged:    DME Agency:     HH Arranged:  PT, RN, Social Work [employer] Agency:  Microbiologist ([place])  Status of Service:  Completed, signed off  If discussed at [employer] of [employer], dates discussed:    Additional Comments:  Dyanara Cozza, Chauncey Reading, RN 11/24/2018, 3:14 PM

## 2018-11-24 NOTE — Care Management (Signed)
Spoke with wife over the phone about DC plan.  Discussed that patient was seen by PT and will have a recommendation for home health PT.  Wife is interested in discussing private pay at the [facility] (prefer [place] facility). Left VM with Doug of [facility].  Spoke with Nelta Numbers of [facility], she will have someone from financial office call patient's wife to discuss options/pay.   Wife reports she "just can not do it anymore". States that patient is very hard to care for when his blood sugars are elevated. She says she is depressed and tired of taking care of him 24/7.

## 2018-11-25 LAB — GLUCOSE, CAPILLARY
Glucose-Capillary: 150 mg/dL — ABNORMAL HIGH (ref 70–99)
Glucose-Capillary: 199 mg/dL — ABNORMAL HIGH (ref 70–99)
Glucose-Capillary: 177 mg/dL — ABNORMAL HIGH (ref 70–99)

## 2018-11-25 NOTE — Progress Notes (Addendum)
Patient given discharge instructions and prescription. Patient dressed in paper scrubs and taken out to wife's car when she called and said she was out front waiting.  Patient's wife became verbally abusive to staff when patient was taken to car, stating was this how we dressed patients. Patient's wife was informed that this was what we did when a patient did not have clothes to leave. Patient's wife and patient had stated that she would not come into the hospital. Patient's wife was cursing at staff and calling staff names. Patient was sitting in car while this was going on telling staff he was sorry.

## 2018-11-25 NOTE — Progress Notes (Signed)
Patient seen and examined; afebrile, oriented x3 and hemodynamically stable.  Continue to be appropriate for discharge.  Changes needed discharge summary or prior instructions.  Barton Dubois MD [phone]

## 2018-11-25 NOTE — Care Management (Addendum)
Wife has spoken with Billey Co of DSS. Wife plans to pick up patient up this afternoon. Bedside RN updated.   Home health referral has been given to [facility].   [facility] consult will be made for community case management to assist wife/patient with a long term plan and assess living situation.    Addendum: Spoke with patient and mother again. They plan to take patient to [facility] in [place] for a few weeks.

## 2018-12-23 ENCOUNTER — Other Ambulatory Visit: Payer: Self-pay | Admitting: *Deleted

## 2018-12-23 NOTE — Patient Outreach (Signed)
Referral received from [place] for transition of care, referral states pt changed to HTA insurance, pt discharged [facility] 11/24/18 and then transferred to [facility], then discharged home, pt with diagnosis diabetes type 2 uncontrolled AIC=11.4 on 11/22/18, paranoid schizophrenia, A-fib, obesity, OSA, CHF, CAD, HTN, outreach call to patient for transition of care week 1, permission given to speak with wife [name], per pt and [name]- pt has [employer] now and does not have HTA as this was cancelled as of January 2020, message sent to [place] pt, wife states insurance coverage is [employer] and advise of next steps.  Jacqlyn Larsen [facility], [place] Coordinator [phone]

## 2019-01-02 ENCOUNTER — Other Ambulatory Visit: Payer: Self-pay | Admitting: *Deleted

## 2019-01-02 NOTE — Patient Outreach (Signed)
Case closed, pt does not have HTA insurance coverage.  Rick Cruz [facility] : [facility], [place] Coordinator [phone]

## 2019-01-08 DIAGNOSIS — E1165 Type 2 diabetes mellitus with hyperglycemia: Secondary | ICD-10-CM | POA: Diagnosis not present

## 2019-01-08 DIAGNOSIS — J449 Chronic obstructive pulmonary disease, unspecified: Secondary | ICD-10-CM | POA: Diagnosis not present

## 2019-01-27 DIAGNOSIS — I4891 Unspecified atrial fibrillation: Secondary | ICD-10-CM | POA: Diagnosis not present

## 2019-01-27 DIAGNOSIS — E1165 Type 2 diabetes mellitus with hyperglycemia: Secondary | ICD-10-CM | POA: Diagnosis not present

## 2019-01-27 DIAGNOSIS — E118 Type 2 diabetes mellitus with unspecified complications: Secondary | ICD-10-CM | POA: Diagnosis not present

## 2019-01-27 DIAGNOSIS — I259 Chronic ischemic heart disease, unspecified: Secondary | ICD-10-CM | POA: Diagnosis not present

## 2019-01-28 ENCOUNTER — Other Ambulatory Visit: Payer: Self-pay | Admitting: *Deleted

## 2019-02-02 DIAGNOSIS — M542 Cervicalgia: Secondary | ICD-10-CM | POA: Diagnosis not present

## 2019-02-02 DIAGNOSIS — J449 Chronic obstructive pulmonary disease, unspecified: Secondary | ICD-10-CM | POA: Diagnosis not present

## 2019-02-02 DIAGNOSIS — I509 Heart failure, unspecified: Secondary | ICD-10-CM | POA: Diagnosis not present

## 2019-02-02 DIAGNOSIS — E1165 Type 2 diabetes mellitus with hyperglycemia: Secondary | ICD-10-CM | POA: Diagnosis not present

## 2019-02-04 DIAGNOSIS — J449 Chronic obstructive pulmonary disease, unspecified: Secondary | ICD-10-CM | POA: Diagnosis not present

## 2019-02-07 DIAGNOSIS — E1165 Type 2 diabetes mellitus with hyperglycemia: Secondary | ICD-10-CM | POA: Diagnosis not present

## 2019-02-24 DIAGNOSIS — I259 Chronic ischemic heart disease, unspecified: Secondary | ICD-10-CM | POA: Diagnosis not present

## 2019-02-24 DIAGNOSIS — E118 Type 2 diabetes mellitus with unspecified complications: Secondary | ICD-10-CM | POA: Diagnosis not present

## 2019-02-24 DIAGNOSIS — E1165 Type 2 diabetes mellitus with hyperglycemia: Secondary | ICD-10-CM | POA: Diagnosis not present

## 2019-02-24 DIAGNOSIS — I4891 Unspecified atrial fibrillation: Secondary | ICD-10-CM | POA: Diagnosis not present

## 2019-03-10 DIAGNOSIS — E1165 Type 2 diabetes mellitus with hyperglycemia: Secondary | ICD-10-CM | POA: Diagnosis not present

## 2019-03-24 ENCOUNTER — Encounter: Payer: Self-pay | Admitting: Podiatry

## 2019-03-24 ENCOUNTER — Other Ambulatory Visit: Payer: Self-pay

## 2019-03-24 ENCOUNTER — Ambulatory Visit (INDEPENDENT_AMBULATORY_CARE_PROVIDER_SITE_OTHER): Payer: Medicare Other | Admitting: Podiatry

## 2019-03-24 VITALS — Temp 97.6°F

## 2019-03-24 DIAGNOSIS — B351 Tinea unguium: Secondary | ICD-10-CM | POA: Diagnosis not present

## 2019-03-24 DIAGNOSIS — M79674 Pain in right toe(s): Secondary | ICD-10-CM

## 2019-03-24 DIAGNOSIS — Z89422 Acquired absence of other left toe(s): Secondary | ICD-10-CM

## 2019-03-24 DIAGNOSIS — M79675 Pain in left toe(s): Secondary | ICD-10-CM | POA: Diagnosis not present

## 2019-03-24 DIAGNOSIS — E1142 Type 2 diabetes mellitus with diabetic polyneuropathy: Secondary | ICD-10-CM | POA: Diagnosis not present

## 2019-03-24 DIAGNOSIS — E118 Type 2 diabetes mellitus with unspecified complications: Secondary | ICD-10-CM | POA: Diagnosis not present

## 2019-03-24 DIAGNOSIS — I259 Chronic ischemic heart disease, unspecified: Secondary | ICD-10-CM | POA: Diagnosis not present

## 2019-03-24 DIAGNOSIS — E1165 Type 2 diabetes mellitus with hyperglycemia: Secondary | ICD-10-CM | POA: Diagnosis not present

## 2019-03-24 DIAGNOSIS — I4891 Unspecified atrial fibrillation: Secondary | ICD-10-CM | POA: Diagnosis not present

## 2019-03-24 NOTE — Patient Instructions (Signed)
Athlete's Foot  Athlete's foot (tinea pedis) is a fungal infection of the skin on your feet. It often occurs on the skin that is between or underneath the toes. It can also occur on the soles of your feet. The infection can spread from person to person (is contagious). It can also spread when a person's bare feet come in contact with the fungus on shower floors or on items such as shoes. What are the causes? This condition is caused by a fungus that grows in warm, moist places. You can get athlete's foot by sharing shoes, shower stalls, towels, and wet floors with someone who is infected. Not washing your feet or changing your socks often enough can also lead to athlete's foot. What increases the risk? This condition is more likely to develop in:  Men.  People who have a weak body defense system (immune system).  People who have diabetes.  People who use public showers, such as at a gym.  People who wear heavy-duty shoes, such as industrial or military shoes.  Seasons with warm, humid weather. What are the signs or symptoms? Symptoms of this condition include:  Itchy areas between your toes or on the soles of your feet.  White, flaky, or scaly areas between your toes or on the soles of your feet.  Very itchy small blisters between your toes or on the soles of your feet.  Small cuts in your skin. These cuts can become infected.  Thick or discolored toenails. How is this diagnosed? This condition may be diagnosed with a physical exam and a review of your medical history. Your health care provider may also take a skin or toenail sample to examine under a microscope. How is this treated? This condition is treated with antifungal medicines. These may be applied as powders, ointments, or creams. In severe cases, an oral antifungal medicine may be given. Follow these instructions at home: Medicines  Apply or take over-the-counter and prescription medicines only as told by your health  care provider.  Apply your antifungal medicine as told by your health care provider. Do not stop using the antifungal even if your condition improves. Foot care  Do not scratch your feet.  Keep your feet dry: ? Wear cotton or wool socks. Change your socks every day or if they become wet. ? Wear shoes that allow air to flow, such as sandals or canvas tennis shoes.  Wash and dry your feet, including the area between your toes. Also, wash and dry your feet: ? Every day or as told by your health care provider. ? After exercising. General instructions  Do not let others use towels, shoes, nail clippers, or other personal items that touch your feet.  Protect your feet by wearing sandals in wet areas, such as locker rooms and shared showers.  Keep all follow-up visits as told by your health care provider. This is important.  If you have diabetes, keep your blood sugar under control. Contact a health care provider if:  You have a fever.  You have swelling, soreness, warmth, or redness in your foot.  Your feet are not getting better with treatment.  Your symptoms get worse.  You have new symptoms. Summary  Athlete's foot (tinea pedis) is a fungal infection of the skin on your feet. It often occurs on skin that is between or underneath the toes.  This condition is caused by a fungus that grows in warm, moist places.  Symptoms include white, flaky, or scaly areas between   your toes or on the soles of your feet.  This condition is treated with antifungal medicines.  Keep your feet clean. Always dry them thoroughly. This information is not intended to replace advice given to you by your health care provider. Make sure you discuss any questions you have with your health care provider. Document Released: 08/31/2000 Document Revised: 08/29/2017 Document Reviewed: 06/24/2017 Elsevier Patient Education  2020 [employer]   Onychomycosis/Fungal Toenails  WHAT IS IT? An infection  that lies within the keratin of your nail plate that is caused by a fungus.  WHY ME? Fungal infections affect all ages, sexes, races, and creeds.  There may be many factors that predispose you to a fungal infection such as age, coexisting medical conditions such as diabetes, or an autoimmune disease; stress, medications, fatigue, genetics, etc.  Bottom line: fungus thrives in a warm, moist environment and your shoes offer such a location.  IS IT CONTAGIOUS? Theoretically, yes.  You do not want to share shoes, nail clippers or files with someone who has fungal toenails.  Walking around barefoot in the same room or sleeping in the same bed is unlikely to transfer the organism.  It is important to realize, however, that fungus can spread easily from one nail to the next on the same foot.  HOW DO WE TREAT THIS?  There are several ways to treat this condition.  Treatment may depend on many factors such as age, medications, pregnancy, liver and kidney conditions, etc.  It is best to ask your doctor which options are available to you.  1. No treatment.   Unlike many other medical concerns, you can live with this condition.  However for many people this can be a painful condition and may lead to ingrown toenails or a bacterial infection.  It is recommended that you keep the nails cut short to help reduce the amount of fungal nail. 2. Topical treatment.  These range from herbal remedies to prescription strength nail lacquers.  About 40-50% effective, topicals require twice daily application for approximately 9 to 12 months or until an entirely new nail has grown out.  The most effective topicals are medical grade medications available through physicians offices. 3. Oral antifungal medications.  With an 80-90% cure rate, the most common oral medication requires 3 to 4 months of therapy and stays in your system for a year as the new nail grows out.  Oral antifungal medications do require blood work to make sure it is  a safe drug for you.  A liver function panel will be performed prior to starting the medication and after the first month of treatment.  It is important to have the blood work performed to avoid any harmful side effects.  In general, this medication safe but blood work is required. 4. Laser Therapy.  This treatment is performed by applying a specialized laser to the affected nail plate.  This therapy is noninvasive, fast, and non-painful.  It is not covered by insurance and is therefore, out of pocket.  The results have been very good with a 80-95% cure rate.  [facility] is the only practice in the area to offer this therapy. 5. Permanent Nail Avulsion.  Removing the entire nail so that a new nail will not grow back. 

## 2019-03-26 NOTE — Progress Notes (Signed)
Subjective: Rick Cruz presents today with painful, thick toenails 1-5 b/l that he cannot cut and which interfere with daily activities.  Pain is aggravated when wearing enclosed shoe gear.  [name], MD is his PCP.    Current Outpatient Medications:  .  acetaminophen (TYLENOL) 500 MG tablet, Take 1,000 mg by mouth 2 (two) times daily as needed for mild pain or headache., Disp: , Rfl:  .  albuterol (PROVENTIL HFA;VENTOLIN HFA) 108 (90 BASE) MCG/ACT inhaler, Inhale 2 puffs into the lungs every 6 (six) hours as needed for wheezing or shortness of breath., Disp: 1 Inhaler, Rfl: 2 .  albuterol (PROVENTIL) (2.5 MG/3ML) 0.083% nebulizer solution, Take 3 mLs (2.5 mg total) by nebulization every 6 (six) hours as needed for wheezing or shortness of breath., Disp: 75 mL, Rfl: 0 .  apixaban (ELIQUIS) 5 MG TABS tablet, Take 1 tablet (5 mg total) by mouth 2 (two) times daily., Disp: 60 tablet, Rfl: 1 .  aspirin EC 81 MG tablet, Take by mouth., Disp: , Rfl:  .  brimonidine (ALPHAGAN) 0.15 % ophthalmic solution, Apply to eye., Disp: , Rfl:  .  cholecalciferol (VITAMIN D) 1000 UNITS tablet, Take 2,000 Units by mouth daily., Disp: , Rfl:  .  citalopram (CELEXA) 40 MG tablet, Take 40 mg by mouth daily., Disp: , Rfl:  .  clopidogrel (PLAVIX) 75 MG tablet, Take by mouth., Disp: , Rfl:  .  Continuous Blood Gluc Receiver (FREESTYLE LIBRE READER) DEVI, as directed, Disp: , Rfl:  .  diclofenac sodium (VOLTAREN) 1 % GEL, Apply 1 application topically 3 (three) times daily. For neck pain, Disp: , Rfl:  .  docusate sodium (COLACE) 100 MG capsule, 1 capsule as needed, Disp: , Rfl:  .  DULoxetine (CYMBALTA) 30 MG capsule, 1 capsule, Disp: , Rfl:  .  fentaNYL (DURAGESIC) 50 MCG/HR, Place onto the skin., Disp: , Rfl:  .  Fluocinolone Acetonide 0.01 % OIL, 5 drops into affected ear, Disp: , Rfl:  .  Fluticasone-Umeclidin-Vilant (TRELEGY ELLIPTA) 100-62.5-25 MCG/INH AEPB, Inhale 1 puff into the lungs daily. Samples given  10/01/18 by Dr. Luan Pulling, Disp: , Rfl:  .  furosemide (LASIX) 20 MG tablet, take 3 tablet by mouth every day, Disp: , Rfl:  .  gabapentin (NEURONTIN) 300 MG capsule, Take 1 capsule (300 mg total) by mouth 3 (three) times daily., Disp: , Rfl:  .  GUAIFENESIN PO, Take 2 tablets by mouth 2 (two) times daily. 400mg  tablets, takes 2 tablets twice daily., Disp: , Rfl:  .  HYDROcodone-acetaminophen (NORCO/VICODIN) 5-325 MG tablet, Take by mouth., Disp: , Rfl:  .  hydrOXYzine (ATARAX/VISTARIL) 25 MG tablet, Take 1 tablet (25 mg total) by mouth every 12 (twelve) hours as needed for itching., Disp: , Rfl:  .  insulin aspart (NOVOLOG FLEXPEN) 100 UNIT/ML FlexPen, Inject 15-25 Units into the skin 3 (three) times daily with meals. <149 =15 units, 150-180 = 17 units, 181-210= 19 units, 211-240= 21 units, 241-270= 23 units, >271= 25 units, Disp: 15 mL, Rfl: 3 .  Insulin Glargine (LANTUS SOLOSTAR) 100 UNIT/ML Solostar Pen, Inject 35 Units into the skin 2 (two) times daily. Per endocrine visit on 07/28/18, Disp: 15 mL, Rfl: 3 .  insulin lispro (HUMALOG) 100 UNIT/ML injection, as directed, Disp: , Rfl:  .  Insulin Pen Needle (PEN NEEDLES) 31G X 8 MM MISC, Use to inject insulin as instructed, Disp: 150 each, Rfl: 3 .  LORazepam (ATIVAN) 0.5 MG tablet, Take by mouth., Disp: , Rfl:  .  metFORMIN (GLUCOPHAGE-XR) 750 MG 24 hr tablet, Take 750 mg by mouth daily with breakfast., Disp: , Rfl:  .  metoprolol succinate (TOPROL-XL) 50 MG 24 hr tablet, Take by mouth., Disp: , Rfl:  .  Multiple Vitamins-Minerals (CENTRUM SILVER 50+MEN PO), Take 1 tablet by mouth daily., Disp: , Rfl:  .  Multiple Vitamins-Minerals (EYE VITAMINS PO), Take 2 capsules by mouth 2 (two) times daily. PerserVision, Disp: , Rfl:  .  NON FORMULARY, Take 1 capsule by mouth 2 (two) times daily. Omega XL OTC, Disp: , Rfl:  .  nystatin-triamcinolone ointment (MYCOLOG), Apply 1 application topically 2 (two) times daily. Apply to both feet twice daily until scales  are gone, Disp: 30 g, Rfl: 2 .  oxyCODONE (OXY IR/ROXICODONE) 5 MG immediate release tablet, Take by mouth., Disp: , Rfl:  .  pantoprazole (PROTONIX) 40 MG tablet, Take 40 mg by mouth 2 (two) times daily., Disp: , Rfl:  .  polyethylene glycol (MIRALAX / GLYCOLAX) 17 g packet, Take by mouth., Disp: , Rfl:  .  senna (SENNA-TIME) 8.6 MG tablet, Take by mouth., Disp: , Rfl:  .  simvastatin (ZOCOR) 40 MG tablet, Take 40 mg by mouth every evening., Disp: , Rfl:  .  terazosin (HYTRIN) 1 MG capsule, Take by mouth., Disp: , Rfl:  .  tiotropium (SPIRIVA) 18 MCG inhalation capsule, Place into inhaler and inhale., Disp: , Rfl:  .  traZODone (DESYREL) 100 MG tablet, Take 100 mg by mouth at bedtime. , Disp: , Rfl:  .  triamcinolone cream (KENALOG) 0.1 %, Apply topically., Disp: , Rfl:  .  Vitamins A & D (VITAMIN A & D) ointment, Apply topically., Disp: , Rfl:  .  ziprasidone (GEODON) 80 MG capsule, Take by mouth., Disp: , Rfl:   Allergies  Allergen Reactions  . Phenergan [Promethazine Hcl] Other (See Comments)    Becomes very confused and aggressive  . Haldol [Haloperidol Lactate] Other (See Comments)    Shaking   . Metformin And Related Diarrhea    Objective: Vitals:   03/24/19 1429  Temp: 97.6 F (36.4 C)    Vascular Examination: Capillary refill time immediate x 8 digits.  Dorsalis pedis palpable b/l.  Posterior tibial pulses nonpalpable b/l.  Digital hair absent b/l.  Skin temperature gradient WNL b/l  Dermatological Examination: Skin with normal turgor, texture and tone b/l  Toenails 1-3 left, 1-5 right discolored, thick, dystrophic with subungual debris and pain with palpation to nailbeds due to thickness of nails.  Musculoskeletal: Muscle strength 5/5 to all LE muscle groups.  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  No pain, crepitus or joint limitation noted with ROM.   Neurological: Sensation diminished with 10 gram monofilament.  Vibratory sensation diminished  b/l.  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 Toenails 1-3 left, 1-5  were debrided in length and girth without iatrogenic bleeding. 2. Patient to continue soft, supportive shoe gear daily. 3. Patient to report any pedal injuries to medical professional immediately. 4. Follow up 3 months.  5. Patient/POA to call should there be a concern in the interim.

## 2019-04-01 DIAGNOSIS — J449 Chronic obstructive pulmonary disease, unspecified: Secondary | ICD-10-CM | POA: Diagnosis not present

## 2019-04-13 ENCOUNTER — Other Ambulatory Visit: Payer: Self-pay | Admitting: *Deleted

## 2019-04-26 ENCOUNTER — Other Ambulatory Visit: Payer: Self-pay

## 2019-04-26 ENCOUNTER — Emergency Department (HOSPITAL_COMMUNITY): Payer: Medicare Other

## 2019-04-26 ENCOUNTER — Observation Stay (HOSPITAL_COMMUNITY)
Admission: EM | Admit: 2019-04-26 | Discharge: 2019-04-27 | Disposition: A | Discharge status: Home / Self Care | Payer: Medicare Other | Attending: Family Medicine | Admitting: Family Medicine

## 2019-04-26 ENCOUNTER — Encounter (HOSPITAL_COMMUNITY): Payer: Self-pay | Admitting: *Deleted

## 2019-04-26 DIAGNOSIS — Z791 Long term (current) use of non-steroidal anti-inflammatories (NSAID): Secondary | ICD-10-CM | POA: Insufficient documentation

## 2019-04-26 DIAGNOSIS — I2510 Atherosclerotic heart disease of native coronary artery without angina pectoris: Secondary | ICD-10-CM | POA: Insufficient documentation

## 2019-04-26 DIAGNOSIS — Z20828 Contact with and (suspected) exposure to other viral communicable diseases: Secondary | ICD-10-CM | POA: Insufficient documentation

## 2019-04-26 DIAGNOSIS — R4182 Altered mental status, unspecified: Secondary | ICD-10-CM | POA: Diagnosis not present

## 2019-04-26 DIAGNOSIS — J9612 Chronic respiratory failure with hypercapnia: Secondary | ICD-10-CM | POA: Diagnosis not present

## 2019-04-26 DIAGNOSIS — M542 Cervicalgia: Secondary | ICD-10-CM | POA: Insufficient documentation

## 2019-04-26 DIAGNOSIS — Z9981 Dependence on supplemental oxygen: Secondary | ICD-10-CM | POA: Insufficient documentation

## 2019-04-26 DIAGNOSIS — E1140 Type 2 diabetes mellitus with diabetic neuropathy, unspecified: Secondary | ICD-10-CM | POA: Insufficient documentation

## 2019-04-26 DIAGNOSIS — J449 Chronic obstructive pulmonary disease, unspecified: Secondary | ICD-10-CM | POA: Diagnosis not present

## 2019-04-26 DIAGNOSIS — I10 Essential (primary) hypertension: Secondary | ICD-10-CM | POA: Diagnosis not present

## 2019-04-26 DIAGNOSIS — F200 Paranoid schizophrenia: Secondary | ICD-10-CM | POA: Insufficient documentation

## 2019-04-26 DIAGNOSIS — M545 Low back pain: Secondary | ICD-10-CM | POA: Diagnosis not present

## 2019-04-26 DIAGNOSIS — Z888 Allergy status to other drugs, medicaments and biological substances status: Secondary | ICD-10-CM | POA: Insufficient documentation

## 2019-04-26 DIAGNOSIS — H5461 Unqualified visual loss, right eye, normal vision left eye: Secondary | ICD-10-CM | POA: Diagnosis not present

## 2019-04-26 DIAGNOSIS — J9611 Chronic respiratory failure with hypoxia: Secondary | ICD-10-CM | POA: Insufficient documentation

## 2019-04-26 DIAGNOSIS — Z7982 Long term (current) use of aspirin: Secondary | ICD-10-CM | POA: Insufficient documentation

## 2019-04-26 DIAGNOSIS — E119 Type 2 diabetes mellitus without complications: Secondary | ICD-10-CM

## 2019-04-26 DIAGNOSIS — G4700 Insomnia, unspecified: Secondary | ICD-10-CM | POA: Diagnosis not present

## 2019-04-26 DIAGNOSIS — I480 Paroxysmal atrial fibrillation: Secondary | ICD-10-CM | POA: Insufficient documentation

## 2019-04-26 DIAGNOSIS — G4733 Obstructive sleep apnea (adult) (pediatric): Secondary | ICD-10-CM | POA: Diagnosis not present

## 2019-04-26 DIAGNOSIS — F329 Major depressive disorder, single episode, unspecified: Secondary | ICD-10-CM | POA: Insufficient documentation

## 2019-04-26 DIAGNOSIS — G8929 Other chronic pain: Secondary | ICD-10-CM | POA: Diagnosis not present

## 2019-04-26 DIAGNOSIS — Z9641 Presence of insulin pump (external) (internal): Secondary | ICD-10-CM | POA: Insufficient documentation

## 2019-04-26 DIAGNOSIS — I110 Hypertensive heart disease with heart failure: Secondary | ICD-10-CM | POA: Diagnosis not present

## 2019-04-26 DIAGNOSIS — I700 Atherosclerosis of aorta: Secondary | ICD-10-CM | POA: Diagnosis not present

## 2019-04-26 DIAGNOSIS — Z794 Long term (current) use of insulin: Secondary | ICD-10-CM | POA: Insufficient documentation

## 2019-04-26 DIAGNOSIS — Z87891 Personal history of nicotine dependence: Secondary | ICD-10-CM | POA: Insufficient documentation

## 2019-04-26 DIAGNOSIS — Z7901 Long term (current) use of anticoagulants: Secondary | ICD-10-CM | POA: Insufficient documentation

## 2019-04-26 DIAGNOSIS — K219 Gastro-esophageal reflux disease without esophagitis: Secondary | ICD-10-CM | POA: Insufficient documentation

## 2019-04-26 DIAGNOSIS — Z8673 Personal history of transient ischemic attack (TIA), and cerebral infarction without residual deficits: Secondary | ICD-10-CM | POA: Insufficient documentation

## 2019-04-26 DIAGNOSIS — E785 Hyperlipidemia, unspecified: Secondary | ICD-10-CM | POA: Insufficient documentation

## 2019-04-26 DIAGNOSIS — Z79899 Other long term (current) drug therapy: Secondary | ICD-10-CM | POA: Insufficient documentation

## 2019-04-26 DIAGNOSIS — I5032 Chronic diastolic (congestive) heart failure: Secondary | ICD-10-CM

## 2019-04-26 DIAGNOSIS — Z9119 Patient's noncompliance with other medical treatment and regimen: Secondary | ICD-10-CM | POA: Diagnosis not present

## 2019-04-26 DIAGNOSIS — G9340 Encephalopathy, unspecified: Secondary | ICD-10-CM | POA: Diagnosis not present

## 2019-04-26 DIAGNOSIS — J9610 Chronic respiratory failure, unspecified whether with hypoxia or hypercapnia: Secondary | ICD-10-CM | POA: Diagnosis present

## 2019-04-26 DIAGNOSIS — Z7902 Long term (current) use of antithrombotics/antiplatelets: Secondary | ICD-10-CM | POA: Insufficient documentation

## 2019-04-26 LAB — BLOOD GAS, ARTERIAL
Acid-Base Excess: 4.7 mmol/L — ABNORMAL HIGH (ref 0.0–2.0)
Bicarbonate: 28 mmol/L (ref 20.0–28.0)
FIO2: 32
O2 Saturation: 96.3 %
Patient temperature: 37
pCO2 arterial: 50.1 mmHg — ABNORMAL HIGH (ref 32.0–48.0)
pH, Arterial: 7.387 (ref 7.350–7.450)
pO2, Arterial: 89.6 mmHg (ref 83.0–108.0)

## 2019-04-26 LAB — CBG MONITORING, ED
Glucose-Capillary: 352 mg/dL — ABNORMAL HIGH (ref 70–99)
Glucose-Capillary: 254 mg/dL — ABNORMAL HIGH (ref 70–99)
Glucose-Capillary: 250 mg/dL — ABNORMAL HIGH (ref 70–99)
Glucose-Capillary: 380 mg/dL — ABNORMAL HIGH (ref 70–99)
Glucose-Capillary: 383 mg/dL — ABNORMAL HIGH (ref 70–99)

## 2019-04-26 LAB — COMPREHENSIVE METABOLIC PANEL
ALT: 14 U/L (ref 0–44)
AST: 14 U/L — ABNORMAL LOW (ref 15–41)
Albumin: 3.8 g/dL (ref 3.5–5.0)
Alkaline Phosphatase: 103 U/L (ref 38–126)
Anion gap: 10 (ref 5–15)
BUN: 8 mg/dL (ref 8–23)
CO2: 26 mmol/L (ref 22–32)
Calcium: 8.9 mg/dL (ref 8.9–10.3)
Chloride: 95 mmol/L — ABNORMAL LOW (ref 98–111)
Creatinine, Ser: 1.07 mg/dL (ref 0.61–1.24)
GFR calc Af Amer: >60 mL/min (ref >60)
GFR calc non Af Amer: >60 mL/min (ref >60)
Glucose, Bld: 360 mg/dL — ABNORMAL HIGH (ref 70–99)
Potassium: 3.7 mmol/L (ref 3.5–5.1)
Sodium: 131 mmol/L — ABNORMAL LOW (ref 135–145)
Total Bilirubin: 0.6 mg/dL (ref 0.3–1.2)
Total Protein: 7 g/dL (ref 6.5–8.1)

## 2019-04-26 LAB — VITAMIN B12: Vitamin B-12: 395 pg/mL (ref 180–914)

## 2019-04-26 LAB — CBC WITH DIFFERENTIAL/PLATELET
Abs Immature Granulocytes: 0.08 10*3/uL — ABNORMAL HIGH (ref 0.00–0.07)
Basophils Absolute: 0.1 10*3/uL (ref 0.0–0.1)
Basophils Relative: 1 %
Eosinophils Absolute: 0.2 10*3/uL (ref 0.0–0.5)
Eosinophils Relative: 1 %
HCT: 39 % (ref 39.0–52.0)
Hemoglobin: 11.9 g/dL — ABNORMAL LOW (ref 13.0–17.0)
Immature Granulocytes: 1 %
Lymphocytes Relative: 16 %
Lymphs Abs: 2.6 10*3/uL (ref 0.7–4.0)
MCH: 25.1 pg — ABNORMAL LOW (ref 26.0–34.0)
MCHC: 30.5 g/dL (ref 30.0–36.0)
MCV: 82.1 fL (ref 80.0–100.0)
Monocytes Absolute: 1 10*3/uL (ref 0.1–1.0)
Monocytes Relative: 6 %
Neutro Abs: 12.7 10*3/uL — ABNORMAL HIGH (ref 1.7–7.7)
Neutrophils Relative %: 75 %
Platelets: 355 10*3/uL (ref 150–400)
RBC: 4.75 MIL/uL (ref 4.22–5.81)
RDW: 14.2 % (ref 11.5–15.5)
WBC: 16.7 10*3/uL — ABNORMAL HIGH (ref 4.0–10.5)
nRBC: 0 % (ref 0.0–0.2)

## 2019-04-26 LAB — RAPID URINE DRUG SCREEN, HOSP PERFORMED
Amphetamines: NOT DETECTED
Barbiturates: NOT DETECTED
Benzodiazepines: NOT DETECTED
Cocaine: NOT DETECTED
Opiates: NOT DETECTED
Tetrahydrocannabinol: NOT DETECTED

## 2019-04-26 LAB — URINALYSIS, ROUTINE W REFLEX MICROSCOPIC
Bilirubin Urine: NEGATIVE
Glucose, UA: >=500 mg/dL — AB
Hgb urine dipstick: NEGATIVE
Ketones, ur: NEGATIVE mg/dL
Leukocytes,Ua: NEGATIVE
Nitrite: NEGATIVE
Protein, ur: NEGATIVE mg/dL
Specific Gravity, Urine: 1.006 (ref 1.005–1.030)
pH: 5 (ref 5.0–8.0)

## 2019-04-26 LAB — TSH: TSH: 2.62 u[IU]/mL (ref 0.350–4.500)

## 2019-04-26 LAB — ACETAMINOPHEN LEVEL: Acetaminophen (Tylenol), Serum: <10 ug/mL — ABNORMAL LOW (ref 10–30)

## 2019-04-26 LAB — BETA-HYDROXYBUTYRIC ACID: Beta-Hydroxybutyric Acid: 0.4 mmol/L — ABNORMAL HIGH (ref 0.05–0.27)

## 2019-04-26 LAB — AMMONIA: Ammonia: 16 umol/L (ref 9–35)

## 2019-04-26 LAB — SALICYLATE LEVEL: Salicylate Lvl: <7 mg/dL (ref 2.8–30.0)

## 2019-04-26 LAB — SEDIMENTATION RATE: Sed Rate: 45 mm/hr — ABNORMAL HIGH (ref 0–16)

## 2019-04-26 LAB — ETHANOL: Alcohol, Ethyl (B): <10 mg/dL (ref <10)

## 2019-04-26 LAB — TROPONIN I (HIGH SENSITIVITY)
Troponin I (High Sensitivity): 8 ng/L (ref <18)
Troponin I (High Sensitivity): 7 ng/L (ref <18)

## 2019-04-26 LAB — SARS CORONAVIRUS 2 BY RT PCR (HOSPITAL ORDER, PERFORMED IN ~~LOC~~ HOSPITAL LAB): SARS Coronavirus 2: NEGATIVE

## 2019-04-26 LAB — LACTIC ACID, PLASMA: Lactic Acid, Venous: 1.6 mmol/L (ref 0.5–1.9)

## 2019-04-26 MED ORDER — SODIUM CHLORIDE 0.9% FLUSH: 3.0000 mL | INTRAVENOUS | Status: DC | PRN

## 2019-04-26 MED ORDER — DOCUSATE SODIUM 100 MG PO CAPS
100.0000 mg | ORAL_CAPSULE | Freq: Two times a day (BID) | ORAL | Status: DC
  Administered 2019-04-26 – 2019-04-27 (×3): 100 mg via ORAL
  Filled 2019-04-26 (×3): qty 1

## 2019-04-26 MED ORDER — ALBUTEROL SULFATE (2.5 MG/3ML) 0.083% IN NEBU: 2.5000 mg | INHALATION_SOLUTION | Freq: Four times a day (QID) | RESPIRATORY_TRACT | Status: DC | PRN

## 2019-04-26 MED ORDER — METFORMIN HCL ER 750 MG PO TB24
750.0000 mg | ORAL_TABLET | Freq: Every day | ORAL | Status: DC
  Administered 2019-04-26: 750 mg via ORAL
  Filled 2019-04-26 (×4): qty 1

## 2019-04-26 MED ORDER — FUROSEMIDE 40 MG PO TABS
40.0000 mg | ORAL_TABLET | Freq: Every evening | ORAL | Status: DC
  Administered 2019-04-27: 17:00:00 40 mg via ORAL
  Filled 2019-04-26: qty 1

## 2019-04-26 MED ORDER — PANTOPRAZOLE SODIUM 40 MG PO TBEC
40.0000 mg | DELAYED_RELEASE_TABLET | Freq: Two times a day (BID) | ORAL | Status: DC
  Administered 2019-04-26 – 2019-04-27 (×3): 40 mg via ORAL
  Filled 2019-04-26 (×3): qty 1

## 2019-04-26 MED ORDER — GABAPENTIN 300 MG PO CAPS
300.0000 mg | ORAL_CAPSULE | Freq: Three times a day (TID) | ORAL | Status: DC
  Administered 2019-04-26 – 2019-04-27 (×4): 300 mg via ORAL
  Filled 2019-04-26 (×4): qty 1

## 2019-04-26 MED ORDER — INSULIN ASPART 100 UNIT/ML ~~LOC~~ SOLN
0.0000 [IU] | Freq: Every day | SUBCUTANEOUS | Status: DC
  Administered 2019-04-26: 5 [IU] via SUBCUTANEOUS
  Filled 2019-04-26: qty 1

## 2019-04-26 MED ORDER — SODIUM CHLORIDE 0.9% FLUSH
3.0000 mL | Freq: Two times a day (BID) | INTRAVENOUS | Status: DC
  Administered 2019-04-26 (×2): 3 mL via INTRAVENOUS

## 2019-04-26 MED ORDER — TRAZODONE HCL 50 MG PO TABS
100.0000 mg | ORAL_TABLET | Freq: Every day | ORAL | Status: DC
  Administered 2019-04-26: 100 mg via ORAL
  Filled 2019-04-26: qty 2

## 2019-04-26 MED ORDER — ACETAMINOPHEN 650 MG RE SUPP: 650.0000 mg | Freq: Four times a day (QID) | RECTAL | Status: DC | PRN

## 2019-04-26 MED ORDER — FUROSEMIDE 40 MG PO TABS
60.0000 mg | ORAL_TABLET | Freq: Every day | ORAL | Status: DC
  Administered 2019-04-26 – 2019-04-27 (×2): 60 mg via ORAL
  Filled 2019-04-26 (×2): qty 2

## 2019-04-26 MED ORDER — APIXABAN 5 MG PO TABS
5.0000 mg | ORAL_TABLET | Freq: Two times a day (BID) | ORAL | Status: DC
  Administered 2019-04-26 – 2019-04-27 (×3): 5 mg via ORAL
  Filled 2019-04-26 (×3): qty 1

## 2019-04-26 MED ORDER — SIMVASTATIN 10 MG PO TABS: 40.0000 mg | ORAL_TABLET | Freq: Every evening | ORAL | Status: DC

## 2019-04-26 MED ORDER — INSULIN ASPART 100 UNIT/ML ~~LOC~~ SOLN
0.0000 [IU] | Freq: Three times a day (TID) | SUBCUTANEOUS | Status: DC
  Administered 2019-04-26: 9 [IU] via SUBCUTANEOUS
  Administered 2019-04-27: 5 [IU] via SUBCUTANEOUS
  Administered 2019-04-27: 7 [IU] via SUBCUTANEOUS
  Administered 2019-04-27: 5 [IU] via SUBCUTANEOUS
  Filled 2019-04-26 (×4): qty 1

## 2019-04-26 MED ORDER — SODIUM CHLORIDE 0.9 % IV SOLN: 250.0000 mL | INTRAVENOUS | Status: DC | PRN

## 2019-04-26 MED ORDER — ACETAMINOPHEN 325 MG PO TABS: 650.0000 mg | ORAL_TABLET | Freq: Four times a day (QID) | ORAL | Status: DC | PRN

## 2019-04-26 MED ORDER — DULOXETINE HCL 30 MG PO CPEP
30.0000 mg | ORAL_CAPSULE | Freq: Every day | ORAL | Status: DC
  Administered 2019-04-26 – 2019-04-27 (×2): 30 mg via ORAL
  Filled 2019-04-26 (×2): qty 1

## 2019-04-26 NOTE — Progress Notes (Addendum)
  Patient seen and evaluated, chart reviewed, please see EMR for updated orders. Please see full H&P dictated by admitting physician Dr [name] for same date of service.   Brief Summary  73 y.o. male,  w Depression (son died from drug overdose), ? Schizophrenia , Chronic lower back pain, hypertension, hyperlipidemia, Dm2 (CZY6A=6.3), CHF (diastolic), OSA noncompliant with Cpap,  Copd on home o2, presents with altered mental status per his wife--admitted on 04/26/2019 with concerns about acute metabolic encephalopathy  -- A/p 1)Depression--with adjustment disorder since death of his son--leading to noncompliance with medical therapy --Psychiatry consultation requested -Was on Cymbalta and trazodone  2)Dementia work-up in progress--- CT head without acute findings, TSH is 2.6, B12 395, ESR 45, RPR pending  3)DM2-uncontrolled, A1c was 11.4 in March 2020,  PTA was on insulin pump, okay to continue Lantus insulin along with sliding scale coverage  4) acute metabolic encephalopathy--- overall from medical standpoint patient is currently stable. 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  --No acute medical problems to preclude transfer to psychiatric service at this time -Without psychiatric intervention patient is unlikely to be compliant with his medical  therapy for chronic comorbid conditions. --Awaiting psychiatric services input regarding disposition for this patient   Roxan Hockey, MD

## 2019-04-26 NOTE — BH Assessment (Signed)
RE-ASSESSMENT  Received call from [name] ED staff stating Pt is now medically cleared. Spoke to Pt via tele-cart. Pt mumbles at talks softly. He is dressed in a hospital gown, obese and appears disheveled. He acknowledges feeling "tired and worn out." He acknowledges feeling depressed and that he has not been caring for himself very well. He denies current suicidal ideation or history of suicide attempts. He denies thoughts of harming others. Pt says he does experience auditory hallucinations but denies hallucinations at this time. Pt's thought processes appear slow.   TTS again tried to reach Pt's wife Rick Cruz at [phone]. The phone call went to her voicemail and her mailbox was full.   Gave clinical report to Rick Sauger, NP including reports by EDP, hospitalist, internal medicine physician and previous full TTS assessment completed earlier today. She recommends inpatient treatment at a geriatric-psychiatry facility. TTS will contact facilities for placement. Notified Dr. Nat Christen and Rick Median, RN of recommendation.  Note by Rick Cruz, [facility] on 04/26/19 1827:  Rick Cruz is a 73 y.o. male who presented to [facility] on voluntary basis (and at wife's recommendation) due to altered mental status.  Per wife, Pt is depressed, he refuses his dialysis and oxygen (Pt has COPD), and he neglects himself.  Per wife's report, Pt has stopped opening his mail, and he is incontinent.  Per wife, Pt has declined significantly over the last month.     Pt was assessed.  Pt was gowned and appeared disheveled.  He had good eye contact, and he was cooperative.  Demeanor was pleasant.  Pt's mood was reported as ''depressed a bit,'' and affect was euthymic.  Pt's speech was normal in rate, rhythm, and volume.  Thought processes were slow.  Thought content suggested circumstantial thinking.  There was no evidence of delusion. Pt denied hallucination, self-injurious behavior, and substance  use concerns.  Pt's memory and concentration were poor.  Insight was poor.  Judgment and impulse control were fair.   When asked why he was at the hospital, Pt mumbled, and he then replied, ''Call my wife.'' When asked about being depressed, Pt said he was depressed ''a bit.'' He denied suicidal ideation and past suicide attempts.  He endorsed poor sleep -- about 4 hours per night.  He also endorsed poor appetite, but he does not know if he lost weight.  When asked about refusing dialysis and oxygen, Pt stated ''I have some trouble with working it.''  He also admitted to incontinence.  Pt stated that he has been treated inpatient on several occasions, but he could not recall where, when, or for what purpose.  He stated that he has been treated outpatient as well, but he could not recall the reason why, when, or where.  Per history, Pt has received diagnoses of MDD and Schizophrenia.  Pt stated that he is not isolating -- he spends time with his wife and mother and their friends.   Author attempted to reach Pt's wife Rick Cruz at [phone].  The phone call went to her voicemail, and her mailbox was full.   Consulted with Berneta Levins, NP, who reviewed Pt's chart and determined that Pt is not medically cleared.  Our plan is to reassess after medically cleared.   Diagnosis: Major Depressive Disorder, Recurrent, unspecified; altered mental status

## 2019-04-26 NOTE — ED Notes (Signed)
Pt lunch tray placed on bedside table. Pt resting at this time. °

## 2019-04-26 NOTE — ED Provider Notes (Signed)
[facility] EMERGENCY DEPARTMENT Provider Note   CSN: [id number] Arrival date & time: 04/26/19  0106    History   Chief Complaint Chief Complaint  Patient presents with  . Altered Mental Status    HPI Rick Cruz is a 73 y.o. male.     Patient presents to the emergency department for evaluation of altered mental status.  He is brought to the ER by his wife.  She reports that over a period of 10 days he has become progressively more confused.  Today he has been extremely agitated.  He has been intermittently yelling, cursing.  He has pulled his insulin pump off and also will not keep his oxygen on.  He normally can answer questions and function on his own, does not know where he is and cannot answer questions or follow commands currently.  He has had similar but not quite as severe confusion with urinary tract infection in the past.     Past Medical History:  Diagnosis Date  . Arthritis   . 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 CHF (congestive heart failure) (HCC)    diastolic   . Chronic pain   . COPD (chronic obstructive pulmonary disease) ([place])   . Depression   . Diabetes mellitus without complication (HCC)    insulin pump  . Hypercholesterolemia   . Hypertension   . On home O2    3 liters  . Oxygen deficiency   . Schizophrenia ([place])   . Sleep apnea    noncompliant with BiPAP  . Sleep apnea   . Tobacco abuse     Patient Active Problem List   Diagnosis Date Noted  . Heart failure ([place]) 02/02/2019  . Hyperglycemia   . Altered mental status 11/21/2018  . Diabetic foot ([place]) 07/23/2018  . Accidental fall 06/25/2018  . Supplemental oxygen dependent 06/25/2018  . 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 Neck pain 01/09/2018  . 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 Diabetic neuropathy ([place]) 12/13/2017  . Gastroesophageal reflux disease 12/13/2017  . Insomnia 07/30/2017  . Depression 07/19/2016  . OSA (obstructive sleep apnea)   . Chronic respiratory  failure with hypercapnia ([place])   . Undifferentiated schizophrenia ([place])   . Uncontrolled type 2 diabetes mellitus with complication ([place])   . Anemia of chronic disease   . Paroxysmal atrial fibrillation (HCC)   . Hyponatremia 06/12/2016  . Aspiration pneumonia ([place]) 08/16/2015  . Seborrheic keratosis 08/04/2015  . Pure hypercholesterolemia 07/05/2015  . Obesity hypoventilation syndrome ([place]) 04/05/2015  . CSA (central sleep apnea) 04/05/2015  . Non compliance with medical treatment 04/05/2015  . Type 2 diabetes mellitus with hyperglycemia ([place]) 02/28/2015  . Acute respiratory failure with hypoxia and hypercarbia (HCC)   . Chronic diastolic CHF (congestive heart failure) ([place]) 08/06/2014  . Recurrent falls 05/08/2014  . Ankle fracture, right 05/08/2014  . Ankle fracture, left 05/08/2014  . C. difficile colitis 01/04/2014  . Delirium 01/02/2014  . Acute encephalopathy 01/02/2014  . Morbid obesity ([place]) 06/17/2013  . Erosive esophagitis 04/22/2013  . Chronic respiratory failure 02 dep with hypercarbia 04/22/2013  . Type 2 diabetes mellitus without complication, with long-term current use of insulin ([place]) 04/21/2013  . Paranoid schizophrenia ([place]) 04/18/2013  . CAD (coronary artery disease) 04/18/2013  . DYSLIPIDEMIA 08/25/2008  . Obstructive sleep apnea 08/25/2008  . Essential hypertension 08/25/2008  . Dyslipidemia 08/25/2008    Past Surgical History:  Procedure Laterality Date  . COLON SURGERY  March 2010  secondary to large colon polyp, final path per discharge summary notes tubulovillous adenoma.   . COLONOSCOPY  July 2011   Dr. Benson Norway: multiple polyps, internal and external hemorrhoids, diverticulosis. tubular adenoma  . ESOPHAGOGASTRODUODENOSCOPY (EGD) WITH PROPOFOL N/A 04/20/2013   Procedure: ESOPHAGOGASTRODUODENOSCOPY (EGD) WITH PROPOFOL;  Surgeon: Daneil Dolin, MD;  Location: [facility];  Service: Endoscopy;  Laterality: N/A;  . KNEE SURGERY     X2        Home  Medications    Prior to Admission medications   Medication Sig Start Date End Date Taking? Authorizing Provider  acetaminophen (TYLENOL) 500 MG tablet Take 1,000 mg by mouth 2 (two) times daily as needed for mild pain or headache.    [provider]  albuterol (PROVENTIL HFA;VENTOLIN HFA) 108 (90 BASE) MCG/ACT inhaler Inhale 2 puffs into the lungs every 6 (six) hours as needed for wheezing or shortness of breath. 05/11/14   Black, Lezlie Octave, NP  albuterol (PROVENTIL) (2.5 MG/3ML) 0.083% nebulizer solution Take 3 mLs (2.5 mg total) by nebulization every 6 (six) hours as needed for wheezing or shortness of breath. 08/21/15   Kathie Dike, MD  apixaban (ELIQUIS) 5 MG TABS tablet Take 1 tablet (5 mg total) by mouth 2 (two) times daily. 06/15/16   Rai, Vernelle Emerald, MD  aspirin EC 81 MG tablet Take by mouth.    [provider]  brimonidine (ALPHAGAN) 0.15 % ophthalmic solution Apply to eye.    [provider]  cholecalciferol (VITAMIN D) 1000 UNITS tablet Take 2,000 Units by mouth daily.    [provider]  citalopram (CELEXA) 40 MG tablet Take 40 mg by mouth daily.    [provider]  clopidogrel (PLAVIX) 75 MG tablet Take by mouth.    [provider]  Continuous Blood Gluc Receiver (FREESTYLE LIBRE READER) DEVI as directed 11/19/18   [provider]  diclofenac sodium (VOLTAREN) 1 % GEL Apply 1 application topically 3 (three) times daily. For neck pain    Janie Morning, DO  docusate sodium (COLACE) 100 MG capsule 1 capsule as needed 05/17/18   [provider]  DULoxetine (CYMBALTA) 30 MG capsule 1 capsule 02/02/19   [provider]  fentaNYL ([place]) 50 MCG/HR Place onto the skin.    [provider]  Fluocinolone Acetonide 0.01 % OIL 5 drops into affected ear 07/23/18   [provider]  Fluticasone-Umeclidin-Vilant (TRELEGY ELLIPTA) 100-62.5-25 MCG/INH AEPB Inhale 1 puff into the lungs daily. Samples given  10/01/18 by Dr. Luan Pulling    [provider]  furosemide (LASIX) 20 MG tablet take 3 tablet by mouth every day    [provider]  gabapentin (NEURONTIN) 300 MG capsule Take 1 capsule (300 mg total) by mouth 3 (three) times daily. 11/22/18   Barton Dubois, MD  GUAIFENESIN PO Take 2 tablets by mouth 2 (two) times daily. 400mg  tablets, takes 2 tablets twice daily.    [provider]  HYDROcodone-acetaminophen (NORCO/VICODIN) 5-325 MG tablet Take by mouth.    [provider]  hydrOXYzine (ATARAX/VISTARIL) 25 MG tablet Take 1 tablet (25 mg total) by mouth every 12 (twelve) hours as needed for itching. 11/22/18   Barton Dubois, MD  insulin aspart (NOVOLOG FLEXPEN) 100 UNIT/ML FlexPen Inject 15-25 Units into the skin 3 (three) times daily with meals. <149 =15 units, 150-180 = 17 units, 181-210= 19 units, 211-240= 21 units, 241-270= 23 units, >271= 25 units 11/22/18   Barton Dubois, MD  Insulin Glargine (LANTUS SOLOSTAR) 100  UNIT/ML Solostar Pen Inject 35 Units into the skin 2 (two) times daily. Per endocrine visit on 07/28/18 11/24/18   Barton Dubois, MD  insulin lispro (HUMALOG) 100 UNIT/ML injection as directed 02/24/19   [provider]  Insulin Pen Needle (PEN NEEDLES) 31G X 8 MM MISC Use to inject insulin as instructed 11/22/18   Barton Dubois, MD  LORazepam (ATIVAN) 0.5 MG tablet Take by mouth.    [provider]  metFORMIN (GLUCOPHAGE-XR) 750 MG 24 hr tablet Take 750 mg by mouth daily with breakfast.    [provider]  metoprolol succinate (TOPROL-XL) 50 MG 24 hr tablet Take by mouth.    [provider]  Multiple Vitamins-Minerals (CENTRUM SILVER 50+MEN PO) Take 1 tablet by mouth daily.    [provider]  Multiple Vitamins-Minerals (EYE VITAMINS PO) Take 2 capsules by mouth 2 (two) times daily. PerserVision    [provider]  NON FORMULARY Take 1 capsule by mouth 2 (two) times daily. Omega XL OTC    [provider]  nystatin-triamcinolone ointment (MYCOLOG) Apply 1 application topically 2 (two) times daily. Apply to both feet twice daily until scales are gone 07/30/18   Marzetta Board, DPM  oxyCODONE (OXY IR/ROXICODONE) 5 MG immediate release tablet Take by mouth. 05/17/18   [provider]  pantoprazole (PROTONIX) 40 MG tablet Take 40 mg by mouth 2 (two) times daily.    [provider]  polyethylene glycol (MIRALAX / GLYCOLAX) 17 g packet Take by mouth. 05/17/18   [provider]  senna (SENNA-TIME) 8.6 MG tablet Take by mouth. 05/17/18   [provider]  simvastatin (ZOCOR) 40 MG tablet Take 40 mg by mouth every evening.    [provider]  terazosin (HYTRIN) 1 MG capsule Take by mouth.    [provider]  tiotropium (SPIRIVA) 18 MCG inhalation capsule Place into inhaler and inhale.    [provider]  traZODone (DESYREL) 100 MG tablet Take 100 mg by mouth at bedtime.     [provider]  triamcinolone cream (KENALOG) 0.1 % Apply topically.    [provider]  Vitamins A & D (VITAMIN A & D) ointment Apply topically.    [provider]  ziprasidone (GEODON) 80 MG capsule Take by mouth.    [provider]    Family History Family History  Problem Relation Age of Onset  . Thyroid disease Mother   . Parkinson's disease Father   . Parkinson's disease Other   . Colon cancer Neg Hx     Social History Social History   Tobacco Use  . Smoking status: Former Smoker    Packs/day: 1.00    Years: 50.00    Pack years: 50.00    Types: Cigarettes    Quit date: 09/16/2013    Years since quitting: 5.6  . Smokeless tobacco: Never Used  Substance Use Topics  . Alcohol use: No    Comment: history of ETOH abuse in remote past, none in at least 10 years  . Drug use: No     Allergies   Phenergan [promethazine hcl], Haldol [haloperidol lactate], and Metformin and related   Review of Systems  Review of Systems  Unable to perform ROS: Mental status change     Physical Exam Updated Vital Signs BP (!) 146/83   Pulse 92   Temp 98.1 F (36.7 C) (Oral)   Resp 17   SpO2 (!) 88%   Physical Exam Vitals signs and  nursing note reviewed.  Constitutional:      General: He is not in acute distress.    Appearance: Normal appearance. He is well-developed.  HENT:     Head: Normocephalic and atraumatic.     Right Ear: Hearing normal.     Left Ear: Hearing normal.     Nose: Nose normal.  Eyes:     Conjunctiva/sclera: Conjunctivae normal.     Pupils: Pupils are equal, round, and reactive to light.  Neck:     Musculoskeletal: Normal range of motion and neck supple.  Cardiovascular:     Rate and Rhythm: Regular rhythm. Tachycardia present.     Heart sounds: S1 normal and S2 normal. No murmur. No friction rub. No gallop.   Pulmonary:     Effort: Pulmonary effort is normal. No respiratory distress.     Breath sounds: Normal breath sounds.  Chest:     Chest wall: No tenderness.  Abdominal:     General: Bowel sounds are normal.     Palpations: Abdomen is soft.     Tenderness: There is no abdominal tenderness. There is no guarding or rebound. Negative signs include Murphy's sign and McBurney's sign.     Hernia: No hernia is present.  Musculoskeletal: Normal range of motion.  Skin:    General: Skin is warm and dry.     Findings: No rash.  Neurological:     Mental Status: He is alert. He is disoriented.     GCS: GCS eye subscore is 3. GCS verbal subscore is 4. GCS motor subscore is 6.     Cranial Nerves: No cranial nerve deficit.     Sensory: No sensory deficit.     Coordination: Coordination normal.  Psychiatric:        Attention and Perception: He is inattentive.      ED Treatments / Results  Labs (all labs ordered are listed, but only abnormal results are displayed) Labs Reviewed  CBC WITH DIFFERENTIAL/PLATELET - Abnormal; Notable for the following components:       Result Value   WBC 16.7 (*)    Hemoglobin 11.9 (*)    MCH 25.1 (*)    Neutro Abs 12.7 (*)    Abs Immature Granulocytes 0.08 (*)    All other components within normal limits  COMPREHENSIVE METABOLIC PANEL - Abnormal; Notable for the following components:   Sodium 131 (*)    Chloride 95 (*)    Glucose, Bld 360 (*)    AST 14 (*)    All other components within normal limits  URINALYSIS, ROUTINE W REFLEX MICROSCOPIC - Abnormal; Notable for the following components:   Glucose, UA >=500 (*)    Bacteria, UA RARE (*)    All other components within normal limits  BLOOD GAS, ARTERIAL - Abnormal; Notable for the following components:   pCO2 arterial 50.1 (*)    Acid-Base Excess 4.7 (*)    All other components within normal limits  BETA-HYDROXYBUTYRIC ACID - Abnormal; Notable for the following components:   Beta-Hydroxybutyric Acid 0.40 (*)    All other components within normal limits  ACETAMINOPHEN LEVEL - Abnormal; Notable for the following components:   Acetaminophen (Tylenol), Serum <10 (*)    All other components within normal limits  CBG MONITORING, ED - Abnormal; Notable for the following components:   Glucose-Capillary 352 (*)    All other components within normal limits  SARS CORONAVIRUS 2 (HOSPITAL ORDER, [place] [facility])  URINE CULTURE  LACTIC ACID,  PLASMA  ETHANOL  RAPID URINE DRUG SCREEN, HOSP PERFORMED  AMMONIA  SALICYLATE LEVEL  VITAMIN B12  SEDIMENTATION RATE  ANA  RPR  TSH  TROPONIN I (HIGH SENSITIVITY)  TROPONIN I (HIGH SENSITIVITY)    EKG EKG Interpretation  Date/Time:  Sunday April 26 2019 01:55:32 EDT Ventricular Rate:  107 PR Interval:    QRS Duration: 115 QT Interval:  340 QTC Calculation: [id number] Axis:   42 Text Interpretation:  Sinus tachycardia Incomplete right bundle branch block No significant change since last tracing Confirmed by Orpah Greek [id number]) on 04/26/2019 2:03:00 AM   Radiology Ct Head Wo Contrast   Result Date: 04/26/2019 CLINICAL DATA:  Encephalopathy EXAM: CT HEAD WITHOUT CONTRAST TECHNIQUE: Contiguous axial images were obtained from the base of the skull through the vertex without intravenous contrast. COMPARISON:  11/22/2018 FINDINGS: Brain: There is no mass, hemorrhage or extra-axial collection. There is generalized atrophy without lobar predilection. Hypodensity of the white matter is most commonly associated with chronic microvascular disease. Vascular: No abnormal hyperdensity of the major intracranial arteries or dural venous sinuses. No intracranial atherosclerosis. Skull: The visualized skull base, calvarium and extracranial soft tissues are normal. Sinuses/Orbits: No fluid levels or advanced mucosal thickening of the visualized paranasal sinuses. No mastoid or middle ear effusion. The orbits are normal. IMPRESSION: Atrophy and chronic microvascular ischemia without acute intracranial abnormality. Electronically Signed   By: Ulyses Jarred M.D.   On: 04/26/2019 03:55   Dg Chest Port 1 View  Result Date: 04/26/2019 CLINICAL DATA:  Fusion EXAM: PORTABLE CHEST 1 VIEW COMPARISON:  11/21/2010 FINDINGS: Cardiac shadow is within normal limits. Aortic calcifications are noted. The lungs are well aerated bilaterally. No focal infiltrate or sizable effusion is seen. IMPRESSION: No acute abnormality noted. Electronically Signed   By: Inez Catalina M.D.   On: 04/26/2019 02:44    Procedures Procedures (including critical care time)  Medications Ordered in ED Medications  sodium chloride flush (NS) 0.9 % injection 3 mL (has no administration in time range)  sodium chloride flush (NS) 0.9 % injection 3 mL (has no administration in time range)  0.9 %  sodium chloride infusion (has no administration in time range)  acetaminophen (TYLENOL) tablet 650 mg (has no administration in time range)    Or  acetaminophen (TYLENOL) suppository 650 mg (has no administration in time range)  insulin aspart (novoLOG)  injection 0-9 Units (has no administration in time range)  insulin aspart (novoLOG) injection 0-5 Units (has no administration in time range)     Initial Impression / Assessment and Plan / ED Course  I have reviewed the triage vital signs and the nursing notes.  Pertinent labs & imaging results that were available during my care of the patient were reviewed by me and considered in my medical decision making (see chart for details).        Patient presents to the emergency department for evaluation of altered mental status.  Patient accompanied by wife.  Wife reports that there has been progressive decline in the patient over an unknown period of time, but over the last 10 days has had significant waxing and waning alterations in his mental status.  She does report a history of similar symptoms with a UTI in the past.  He has not had any specific symptoms.  He has had a thorough work-up which does not reveal any clear cause for delirium or encephalopathy.  Patient is, however, significantly altered.  He has been intermittently somnolent followed by  awake, agitated and combative here in the ER.  Will require further evaluation to determine etiology.  Final Clinical Impressions(s) / ED Diagnoses   Final diagnoses:  Altered mental status, unspecified altered mental status type    ED Discharge Orders    None       Orpah Greek, MD 04/26/19 [phone]

## 2019-04-26 NOTE — ED Notes (Signed)
Pt wife requests to be called upon his admission to the floor. [place]. Permission to leave message on voice mail.

## 2019-04-26 NOTE — Progress Notes (Signed)
Patient states he is not ready to place CPAP on at this time. RN will call when patient is ready

## 2019-04-26 NOTE — BH Assessment (Signed)
Tele Assessment Note   Patient Name: Rick Cruz MRN: [id number] Referring Physician: Maurene Capes, MD Location of Patient: [facility] Location of Provider: [place] Department  Rick Cruz is a 73 y.o. male who presented to [facility] on voluntary basis (and at wife's recommendation) due to altered mental status.  Per wife, Pt is depressed, he refuses his dialysis and oxygen (Pt has COPD), and he neglects himself.  Per wife's report, Pt has stopped opening his mail, and he is incontinent.  Per wife, Pt has declined significantly over the last month.    Pt was assessed.  Pt was gowned and appeared disheveled.  He had good eye contact, and he was cooperative.  Demeanor was pleasant.  Pt's mood was reported as ''depressed a bit,'' and affect was euthymic.  Pt's speech was normal in rate, rhythm, and volume.  Thought processes were slow.  Thought content suggested circumstantial thinking.  There was no evidence of delusion. Pt denied hallucination, self-injurious behavior, and substance use concerns.  Pt's memory and concentration were poor.  Insight was poor.  Judgment and impulse control were fair.  When asked why he was at the hospital, Pt mumbled, and he then replied, ''Call my wife.'' When asked about being depressed, Pt said he was depressed ''a bit.'' He denied suicidal ideation and past suicide attempts.  He endorsed poor sleep -- about 4 hours per night.  He also endorsed poor appetite, but he does not know if he lost weight.  When asked about refusing dialysis and oxygen, Pt stated ''I have some trouble with working it.''  He also admitted to incontinence.  Pt stated that he has been treated inpatient on several occasions, but he could not recall where, when, or for what purpose.  He stated that he has been treated outpatient as well, but he could not recall the reason why, when, or where.  Per history, Pt has received diagnoses of MDD and Schizophrenia.  Pt stated that he is not isolating --  he spends time with his wife and mother and their friends.  Author attempted to reach Pt's wife [name] at [phone].  The phone call went to her voicemail, and her mailbox was full.  Consulted with Berneta Levins, NP, who reviewed Pt's chart and determined that Pt is not medically cleared.  Our plan is to reassess after medically cleared.  Diagnosis: Major Depressive Disorder, Recurrent, unspecified; altered mental status  Past Medical History:  Past Medical History:  Diagnosis Date  . Arthritis   . 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 CHF (congestive heart failure) (HCC)    diastolic   . Chronic pain   . COPD (chronic obstructive pulmonary disease) ([place])   . Depression   . Diabetes mellitus without complication (HCC)    insulin pump  . Hypercholesterolemia   . Hypertension   . On home O2    3 liters  . Oxygen deficiency   . Schizophrenia ([place])   . Sleep apnea    noncompliant with BiPAP  . Sleep apnea   . Tobacco abuse     Past Surgical History:  Procedure Laterality Date  . COLON SURGERY  March 2010   secondary to large colon polyp, final path per discharge summary notes tubulovillous adenoma.   . COLONOSCOPY  July 2011   Dr. Benson [place]: multiple polyps, internal and external hemorrhoids, diverticulosis. tubular adenoma  . ESOPHAGOGASTRODUODENOSCOPY (EGD) WITH PROPOFOL N/A 04/20/2013   Procedure: ESOPHAGOGASTRODUODENOSCOPY (EGD) WITH PROPOFOL;  Surgeon: Daneil Dolin, MD;  Location: [facility];  Service: Endoscopy;  Laterality: N/A;  . KNEE SURGERY     X2    Family History:  Family History  Problem Relation Age of Onset  . Thyroid disease Mother   . Parkinson's disease Father   . Parkinson's disease Other   . Colon cancer Neg Hx     Social History:  reports that he quit smoking about 5 years ago. His smoking use included cigarettes. He has a 50.00 pack-year smoking history. He has never used smokeless tobacco. He reports that he does not drink alcohol or use  drugs.  Additional Social History:  Alcohol / Drug Use Pain Medications: See MAR Prescriptions: See MAR Over the Counter: See MAR History of alcohol / drug use?: No history of alcohol / drug abuse  CIWA: CIWA-Ar BP: (!) 144/69 Pulse Rate: 96 COWS:    Allergies:  Allergies  Allergen Reactions  . Phenergan [Promethazine Hcl] Other (See Comments)    Becomes very confused and aggressive  . Haldol [Haloperidol Lactate] Other (See Comments)    Shaking   . Metformin And Related Diarrhea    Home Medications: (Not in a hospital admission)   OB/GYN Status:  No LMP for male patient.  General Assessment Data Location of Assessment: [facility] TTS Assessment: In system Is this a Tele or Face-to-Face Assessment?: Tele Assessment Is this an Initial Assessment or a Re-assessment for this encounter?: Initial Assessment Patient Accompanied by:: N/A Language Other than English: No Living Arrangements: Other (Comment) What gender do you identify as?: Male Marital status: Married Living Arrangements: Spouse/significant other, Parent(Wife Merchant navy officer, mother) Can pt return to current living arrangement?: Yes Admission Status: Voluntary Is patient capable of signing voluntary admission?: Yes Referral Source: Self/Family/Friend Insurance type: BCBS [facility]     Crisis Care Plan Living Arrangements: Spouse/significant other, Parent(Wife Merchant navy officer, mother) Name of Psychiatrist: Cannot recall Name of Therapist: Cannot recall  Education Status Is patient currently in school?: No Is the patient employed, unemployed or receiving disability?: Unemployed  Risk to self with the past 6 months Suicidal Ideation: No(Pt denied; but see notes) Has patient been a risk to self within the past 6 months prior to admission? : Other (comment)(See notes) Has patient had any suicidal intent within the past 6 months prior to admission? : No Is patient at risk for suicide?: (See notes) Suicidal Plan?: No Has patient  had any suicidal plan within the past 6 months prior to admission? : No Access to Means: No What has been your use of drugs/alcohol within the last 12 months?: Denied Previous Attempts/Gestures: No(Pt denied) Intentional Self Injurious Behavior: None(See notes) Family Suicide History: No Recent stressful life event(s): Other (Comment)(Not using oxygen/dialysis) Persecutory voices/beliefs?: No Depression: Yes Depression Symptoms: Despondent, Insomnia, Fatigue, Loss of interest in usual pleasures(Poor appetite) Substance abuse history and/or treatment for substance abuse?: No Suicide prevention information given to non-admitted patients: Not applicable  Risk to Others within the past 6 months Homicidal Ideation: No Does patient have any lifetime risk of violence toward others beyond the six months prior to admission? : No Thoughts of Harm to Others: No Current Homicidal Intent: No Current Homicidal Plan: No Access to Homicidal Means: No History of harm to others?: No Assessment of Violence: None Noted Does patient have access to weapons?: No Criminal Charges Pending?: No Does patient have a court date: No Is patient on probation?: No  Psychosis Hallucinations: None noted Delusions: None noted  Mental Status Report Appearance/Hygiene: [employer]  Eye Contact: Good Motor Activity: Freedom of movement, Unremarkable Speech: Logical/coherent Level of Consciousness: Alert Mood: Depressed Affect: Other (Comment)(Pleasant) Anxiety Level: None Thought Processes: Circumstantial Judgement: Partial Orientation: Person, Place, Time Obsessive Compulsive Thoughts/Behaviors: None  Cognitive Functioning Concentration: Normal Memory: Recent Impaired, Remote Impaired Is patient IDD: No Insight: Poor Impulse Control: Fair Appetite: Poor Have you had any weight changes? : (Not sure) Sleep: Decreased Total Hours of Sleep: 4 Vegetative Symptoms: None  ADLScreening [facility] Assessment  Services) Patient's cognitive ability adequate to safely complete daily activities?: Yes Patient able to express need for assistance with ADLs?: Yes Independently performs ADLs?: Yes (appropriate for developmental age)  Prior Inpatient Therapy Prior Inpatient Therapy: Yes Prior Therapy Dates: (Could not recall) Prior Therapy Facilty/Provider(s): (Could not recall) Reason for Treatment: (Could not recall)  Prior Outpatient Therapy Prior Outpatient Therapy: Yes Prior Therapy Dates: (Could not recall) Prior Therapy Facilty/Provider(s): (Could not recall) Reason for Treatment: (Could not recall) Does patient have an ACCT team?: No Does patient have Intensive In-House Services?  : No Does patient have [facility] services? : No Does patient have P4CC services?: No  ADL Screening (condition at time of admission) Patient's cognitive ability adequate to safely complete daily activities?: Yes Is the patient deaf or have difficulty hearing?: No Does the patient have difficulty seeing, even when wearing glasses/contacts?: No Does the patient have difficulty concentrating, remembering, or making decisions?: No Patient able to express need for assistance with ADLs?: Yes Does the patient have difficulty dressing or bathing?: No Independently performs ADLs?: Yes (appropriate for developmental age) Does the patient have difficulty walking or climbing stairs?: No Weakness of Legs: None Weakness of Arms/Hands: None  Home Assistive Devices/Equipment Home Assistive Devices/Equipment: None  Therapy Consults (therapy consults require a physician order) PT Evaluation Needed: No OT Evalulation Needed: No SLP Evaluation Needed: No Abuse/Neglect Assessment (Assessment to be complete while patient is alone) Abuse/Neglect Assessment Can Be Completed: Yes Physical Abuse: Denies Verbal Abuse: Denies Sexual Abuse: Denies Exploitation of patient/patient's resources: Denies Values / Beliefs Cultural  Requests During Hospitalization: None Spiritual Requests During Hospitalization: None Consults Spiritual Care Consult Needed: No Social Work Consult Needed: No Regulatory affairs officer (For Healthcare) Does Patient Have a Medical Advance Directive?: No          Disposition:  Disposition Initial Assessment Completed for this Encounter: Yes  This service was provided via telemedicine using a 2-way, interactive audio and Radiographer, therapeutic.  Names of all persons participating in this telemedicine service and their role in this encounter. Name: Ankith Issa Role: Pt             Marlowe Aschoff 04/26/2019 8:41 AM

## 2019-04-26 NOTE — H&P (Signed)
TRH H&P    Patient Demographics:    Rick Cruz, is a 73 y.o. male  MRN: [id number]  DOB - [birth year]  Admit Date - 04/26/2019  Referring MD/NP/PA: Mathis Fare  Outpatient Primary MD for the patient is [name], MD  Patient coming from:  home  Chief complaint-  Altered mental status   HPI:    Rick Cruz  is a 73 y.o. male,  w Depression (son died from drug overdose), ? Schizophrenia , Chronic lower back pain, hypertension, hyperlipidemia, Dm2 (QBV6X=4.5), CHF (diastolic), OSA noncompliant with Cpap,  Copd on home o2, presents with altered mental status per his wife.    Pt doesn't want to keep his oxygen on or keep his insulin pump on , wife states that he is more irritable.  Doesn't open or read his mail.  Wife states that he is incontinent of stool and urine at home and that she is tired of cleaning up after him.   Pt is alert and oriented to name, place ([place]), and time (year), but not month.  Pt appears to have declined physically in that he appears deconditioned.  He also appears to have declined some mentally, but at baseline he was not super sharp.    In ED,  T 98.1, P 65, R 22 Bp 121/90  Pox 93% on RA  CT brain IMPRESSION: Atrophy and chronic microvascular ischemia without acute intracranial abnormality.  CXR IMPRESSION: No acute abnormality noted.  Na 131, K 3.7, Bun 8, Creatinine 1.07 Glucose 360 Ast 14, Alt 14   Trop 8 Lactic acid 1.6 Ammonia 16 Beta hydroxybutryic acid 0.4 Salicylate <0.3 Tylenol <10  Urinalysis negative UDS negative ABG pH 7.387 pCO2 50.1, pO2 89.6  covid -19 negative  Pt will be admitted for altered mental status,    Review of systems:    In addition to the HPI above, pt states he is ok but his wife thinks that he has had mental status change No Fever-chills, No Headache, No changes with Vision or hearing, No problems swallowing  food or Liquids, No Chest pain, Cough or Shortness of Breath, No Abdominal pain, No Nausea or Vomiting, bowel movements are regular, No Blood in stool or Urine, No dysuria, No new skin rashes or bruises, No new joints pains-aches,  No new weakness, tingling, numbness in any extremity, No recent weight gain or loss, No polyuria, polydypsia or polyphagia, No significant Mental Stressors.  All other systems reviewed and are negative.    Past History of the following :    Past Medical History:  Diagnosis Date  . Arthritis   . 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 CHF (congestive heart failure) (HCC)    diastolic   . Chronic pain   . COPD (chronic obstructive pulmonary disease) ([place])   . Depression   . Diabetes mellitus without complication (HCC)    insulin pump  . Hypercholesterolemia   . Hypertension   . On home O2    3 liters  . Oxygen deficiency   .  Schizophrenia ([place])   . Sleep apnea    noncompliant with BiPAP  . Sleep apnea   . Tobacco abuse       Past Surgical History:  Procedure Laterality Date  . COLON SURGERY  March 2010   secondary to large colon polyp, final path per discharge summary notes tubulovillous adenoma.   . COLONOSCOPY  July 2011   Dr. Benson Norway: multiple polyps, internal and external hemorrhoids, diverticulosis. tubular adenoma  . ESOPHAGOGASTRODUODENOSCOPY (EGD) WITH PROPOFOL N/A 04/20/2013   Procedure: ESOPHAGOGASTRODUODENOSCOPY (EGD) WITH PROPOFOL;  Surgeon: Daneil Dolin, MD;  Location: [facility];  Service: Endoscopy;  Laterality: N/A;  . KNEE SURGERY     X2      Social History:      Social History   Tobacco Use  . Smoking status: Former Smoker    Packs/day: 1.00    Years: 50.00    Pack years: 50.00    Types: Cigarettes    Quit date: 09/16/2013    Years since quitting: 5.6  . Smokeless tobacco: Never Used  Substance Use Topics  . Alcohol use: No    Comment: history of ETOH abuse in remote past, none in at least 10 years        Family History :     Family History  Problem Relation Age of Onset  . Thyroid disease Mother   . Parkinson's disease Father   . Parkinson's disease Other   . Colon cancer Neg Hx       Home Medications:   Prior to Admission medications   Medication Sig Start Date End Date Taking? Authorizing Provider  acetaminophen (TYLENOL) 500 MG tablet Take 1,000 mg by mouth 2 (two) times daily as needed for mild pain or headache.    [provider]  albuterol (PROVENTIL HFA;VENTOLIN HFA) 108 (90 BASE) MCG/ACT inhaler Inhale 2 puffs into the lungs every 6 (six) hours as needed for wheezing or shortness of breath. 05/11/14   Black, Lezlie Octave, NP  albuterol (PROVENTIL) (2.5 MG/3ML) 0.083% nebulizer solution Take 3 mLs (2.5 mg total) by nebulization every 6 (six) hours as needed for wheezing or shortness of breath. 08/21/15   Kathie Dike, MD  apixaban (ELIQUIS) 5 MG TABS tablet Take 1 tablet (5 mg total) by mouth 2 (two) times daily. 06/15/16   Rai, Vernelle Emerald, MD  aspirin EC 81 MG tablet Take by mouth.    [provider]  brimonidine (ALPHAGAN) 0.15 % ophthalmic solution Apply to eye.    [provider]  cholecalciferol (VITAMIN D) 1000 UNITS tablet Take 2,000 Units by mouth daily.    [provider]  citalopram (CELEXA) 40 MG tablet Take 40 mg by mouth daily.    [provider]  clopidogrel (PLAVIX) 75 MG tablet Take by mouth.    [provider]  Continuous Blood Gluc Receiver (FREESTYLE LIBRE READER) DEVI as directed 11/19/18   [provider]  diclofenac sodium (VOLTAREN) 1 % GEL Apply 1 application topically 3 (three) times daily. For neck pain    Janie Morning, DO  docusate sodium (COLACE) 100 MG capsule 1 capsule as needed 05/17/18   [provider]  DULoxetine (CYMBALTA) 30 MG capsule 1 capsule 02/02/19   [provider]  fentaNYL ([place]) 50 MCG/HR Place onto the skin.    [provider]  Fluocinolone  Acetonide 0.01 % OIL 5 drops into affected ear 07/23/18   [provider]  Fluticasone-Umeclidin-Vilant (TRELEGY ELLIPTA) 100-62.5-25 MCG/INH AEPB Inhale 1 puff into  the lungs daily. Samples given 10/01/18 by Dr. Luan Pulling    [provider]  furosemide (LASIX) 20 MG tablet take 3 tablet by mouth every day    [provider]  gabapentin (NEURONTIN) 300 MG capsule Take 1 capsule (300 mg total) by mouth 3 (three) times daily. 11/22/18   Barton Dubois, MD  GUAIFENESIN PO Take 2 tablets by mouth 2 (two) times daily. 448m tablets, takes 2 tablets twice daily.    [provider]  HYDROcodone-acetaminophen (NORCO/VICODIN) 5-325 MG tablet Take by mouth.    [provider]  hydrOXYzine (ATARAX/VISTARIL) 25 MG tablet Take 1 tablet (25 mg total) by mouth every 12 (twelve) hours as needed for itching. 11/22/18   MBarton Dubois MD  insulin aspart (NOVOLOG FLEXPEN) 100 UNIT/ML FlexPen Inject 15-25 Units into the skin 3 (three) times daily with meals. <149 =15 units, 150-180 = 17 units, 181-210= 19 units, 211-240= 21 units, 241-270= 23 units, >271= 25 units 11/22/18   MBarton Dubois MD  Insulin Glargine (LANTUS SOLOSTAR) 100 UNIT/ML Solostar Pen Inject 35 Units into the skin 2 (two) times daily. Per endocrine visit on 07/28/18 11/24/18   MBarton Dubois MD  insulin lispro (HUMALOG) 100 UNIT/ML injection as directed 02/24/19   [provider]  Insulin Pen Needle (PEN NEEDLES) 31G X 8 MM MISC Use to inject insulin as instructed 11/22/18   MBarton Dubois MD  LORazepam (ATIVAN) 0.5 MG tablet Take by mouth.    [provider]  metFORMIN (GLUCOPHAGE-XR) 750 MG 24 hr tablet Take 750 mg by mouth daily with breakfast.    [provider]  metoprolol succinate (TOPROL-XL) 50 MG 24 hr tablet Take by mouth.    [provider]  Multiple Vitamins-Minerals (CENTRUM SILVER 50+MEN PO) Take 1 tablet by mouth daily.    [provider]  Multiple  Vitamins-Minerals (EYE VITAMINS PO) Take 2 capsules by mouth 2 (two) times daily. PerserVision    [provider]  NON FORMULARY Take 1 capsule by mouth 2 (two) times daily. Omega XL OTC    [provider]  nystatin-triamcinolone ointment (MYCOLOG) Apply 1 application topically 2 (two) times daily. Apply to both feet twice daily until scales are gone 07/30/18   GMarzetta Board DPM  oxyCODONE (OXY IR/ROXICODONE) 5 MG immediate release tablet Take by mouth. 05/17/18   [provider]  pantoprazole (PROTONIX) 40 MG tablet Take 40 mg by mouth 2 (two) times daily.    [provider]  polyethylene glycol (MIRALAX / GLYCOLAX) 17 g packet Take by mouth. 05/17/18   [provider]  senna (SENNA-TIME) 8.6 MG tablet Take by mouth. 05/17/18   [provider]  simvastatin (ZOCOR) 40 MG tablet Take 40 mg by mouth every evening.    [provider]  terazosin (HYTRIN) 1 MG capsule Take by mouth.    [provider]  tiotropium (SPIRIVA) 18 MCG inhalation capsule Place into inhaler and inhale.    [provider]  traZODone (DESYREL) 100 MG tablet Take 100 mg by mouth at bedtime.     [provider]  triamcinolone cream (KENALOG) 0.1 % Apply topically.    [provider]  Vitamins A & D (VITAMIN A & D) ointment Apply topically.    [provider]  ziprasidone (GEODON) 80 MG capsule Take by mouth.    [provider]     Allergies:     Allergies  Allergen Reactions  . Phenergan [Promethazine Hcl] Other (See Comments)  Becomes very confused and aggressive  . Haldol [Haloperidol Lactate] Other (See Comments)    Shaking   . Metformin And Related Diarrhea     Physical Exam:   Vitals  Blood pressure 120/85, pulse 92, temperature 98.1 F (36.7 C), temperature source Oral, resp. rate 17, SpO2 (!) 88 %.  1.  General: axoxo3  2. Psychiatric: euthymic  3. Neurologic: cn2-12 intact,  reflexes 2+ symmetric, diffuse with no clonus, motor 5/5 in all 4 ext  4. HEENMT:  Anicteric, pupils 1.56m symmetric, direct, consensual, near intact Neck: no jvd, no bruit  5. Respiratory : Prolonged exp phase, no crackles, no wheezing  6. Cardiovascular : rrr s1, s2, no m/g/r  7. Gastrointestinal:  Abd: soft, nt, nd, +bs  8. Skin:  Ext: no c/c/e,  No rash  9.Musculoskeletal:  Good ROM,   No adenopathy    Data Review:    CBC Recent Labs  Lab 04/26/19 0200  WBC 16.7*  HGB 11.9*  HCT 39.0  PLT 355  MCV 82.1  MCH 25.1*  MCHC 30.5  RDW 14.2  LYMPHSABS 2.6  MONOABS 1.0  EOSABS 0.2  BASOSABS 0.1   ------------------------------------------------------------------------------------------------------------------  Results for orders placed or performed during the hospital encounter of 04/26/19 (from the past 48 hour(s))  CBG monitoring, ED     Status: Abnormal   Collection Time: 04/26/19  1:51 AM  Result Value Ref Range   Glucose-Capillary 352 (H) 70 - 99 mg/dL   Comment 1 Document in Chart   CBC with Differential/Platelet     Status: Abnormal   Collection Time: 04/26/19  2:00 AM  Result Value Ref Range   WBC 16.7 (H) 4.0 - 10.5 K/uL   RBC 4.75 4.22 - 5.81 MIL/uL   Hemoglobin 11.9 (L) 13.0 - 17.0 g/dL   HCT 39.0 39.0 - 52.0 %   MCV 82.1 80.0 - 100.0 fL   MCH 25.1 (L) 26.0 - 34.0 pg   MCHC 30.5 30.0 - 36.0 g/dL   RDW 14.2 11.5 - 15.5 %   Platelets 355 150 - 400 K/uL   nRBC 0.0 0.0 - 0.2 %   Neutrophils Relative % 75 %   Neutro Abs 12.7 (H) 1.7 - 7.7 K/uL   Lymphocytes Relative 16 %   Lymphs Abs 2.6 0.7 - 4.0 K/uL   Monocytes Relative 6 %   Monocytes Absolute 1.0 0.1 - 1.0 K/uL   Eosinophils Relative 1 %   Eosinophils Absolute 0.2 0.0 - 0.5 K/uL   Basophils Relative 1 %   Basophils Absolute 0.1 0.0 - 0.1 K/uL   Immature Granulocytes 1 %   Abs Immature Granulocytes 0.08 (H) 0.00 - 0.07 K/uL    Comment: Performed at [name] [address]  [place] [address] Comprehensive metabolic panel     Status: Abnormal   Collection Time: 04/26/19  2:00 AM  Result Value Ref Range   Sodium 131 (L) 135 - 145 mmol/L   Potassium 3.7 3.5 - 5.1 mmol/L   Chloride 95 (L) 98 - 111 mmol/L   CO2 26 22 - 32 mmol/L   Glucose, Bld 360 (H) 70 - 99 mg/dL   BUN 8 8 - 23 mg/dL   Creatinine, Ser 1.07 0.61 - 1.24 mg/dL   Calcium 8.9 8.9 - 10.3 mg/dL   Total Protein 7.0 6.5 - 8.1 g/dL   Albumin 3.8 3.5 - 5.0 g/dL   AST 14 (L) 15 - 41 U/L   ALT 14 0 - 44  U/L   Alkaline Phosphatase 103 38 - 126 U/L   Total Bilirubin 0.6 0.3 - 1.2 mg/dL   GFR calc non Af Amer >60 >60 mL/min   GFR calc Af Amer >60 >60 mL/min   Anion gap 10 5 - 15    Comment: Performed at [facility], [address] [place], [place] [address]  Troponin I (High Sensitivity)     Status: None   Collection Time: 04/26/19  2:00 AM  Result Value Ref Range   Troponin I (High Sensitivity) 8 <18 ng/L    Comment: (NOTE) Elevated high sensitivity troponin I (hsTnI) values and significant  changes across serial measurements may suggest ACS but many other  chronic and acute conditions are known to elevate hsTnI results.  Refer to the "Links" section for chest pain algorithms and additional  guidance. Performed at [facility], [address], [place], [place] [address]   Lactic acid, plasma     Status: None   Collection Time: 04/26/19  2:00 AM  Result Value Ref Range   Lactic Acid, Venous 1.6 0.5 - 1.9 mmol/L    Comment: Performed at [facility], [address], [place], [place] [address]  Ethanol     Status: None   Collection Time: 04/26/19  2:00 AM  Result Value Ref Range   Alcohol, Ethyl (B) <10 <10 mg/dL    Comment: (NOTE) Lowest detectable limit for serum alcohol is 10 mg/dL. For medical purposes only. Performed at [facility], [address], [place], [place] [address]   Ammonia     Status: None   Collection Time: 04/26/19  2:00 AM  Result Value Ref Range   Ammonia 16 9 - 35  umol/L    Comment: Performed at [facility], [address], [place], [place] [address]  Beta-hydroxybutyric acid     Status: Abnormal   Collection Time: 04/26/19  2:00 AM  Result Value Ref Range   Beta-Hydroxybutyric Acid 0.40 (H) 0.05 - 0.27 mmol/L    Comment: Performed at [facility], [address], [place], [place] [address]  Salicylate level     Status: None   Collection Time: 04/26/19  2:00 AM  Result Value Ref Range   Salicylate Lvl <9.4 2.8 - 30.0 mg/dL    Comment: Performed at [facility], [address] [place], [place] [address]  Acetaminophen level     Status: Abnormal   Collection Time: 04/26/19  2:00 AM  Result Value Ref Range   Acetaminophen (Tylenol), Serum <10 (L) 10 - 30 ug/mL    Comment: (NOTE) Therapeutic concentrations vary significantly. A range of 10-30 ug/mL  may be an effective concentration for many patients. However, some  are best treated at concentrations outside of this range. Acetaminophen concentrations >150 ug/mL at 4 hours after ingestion  and >50 ug/mL at 12 hours after ingestion are often associated with  toxic reactions. Performed at [facility], [address], [place], [place] [address]   Urinalysis, Routine w reflex microscopic     Status: Abnormal   Collection Time: 04/26/19  2:16 AM  Result Value Ref Range   Color, Urine YELLOW YELLOW   APPearance CLEAR CLEAR   Specific Gravity, Urine 1.006 1.005 - 1.030   pH 5.0 5.0 - 8.0   Glucose, UA >=500 (A) NEGATIVE mg/dL   Hgb urine dipstick NEGATIVE NEGATIVE   Bilirubin Urine NEGATIVE NEGATIVE   Ketones, ur NEGATIVE NEGATIVE mg/dL   Protein, ur NEGATIVE NEGATIVE mg/dL   Nitrite NEGATIVE NEGATIVE  Leukocytes,Ua NEGATIVE NEGATIVE   RBC / HPF 0-5 0 - 5 RBC/hpf   WBC, UA 0-5 0 - 5 WBC/hpf   Bacteria, UA RARE (A) NONE SEEN   Hyaline Casts, UA PRESENT     Comment: Performed at [facility], [address], [place], [place] [address]  Rapid urine drug screen (hospital performed)     Status: None    Collection Time: 04/26/19  2:17 AM  Result Value Ref Range   Opiates NONE DETECTED NONE DETECTED   Cocaine NONE DETECTED NONE DETECTED   Benzodiazepines NONE DETECTED NONE DETECTED   Amphetamines NONE DETECTED NONE DETECTED   Tetrahydrocannabinol NONE DETECTED NONE DETECTED   Barbiturates NONE DETECTED NONE DETECTED    Comment: (NOTE) DRUG SCREEN FOR MEDICAL PURPOSES ONLY.  IF CONFIRMATION IS NEEDED FOR ANY PURPOSE, NOTIFY LAB WITHIN 5 DAYS. LOWEST DETECTABLE LIMITS FOR URINE DRUG SCREEN Drug Class                     Cutoff (ng/mL) Amphetamine and metabolites    1000 Barbiturate and metabolites    200 Benzodiazepine                 097 Tricyclics and metabolites     300 Opiates and metabolites        300 Cocaine and metabolites        300 THC                            50 Performed at [facility], [address] [place], [place] [address]   Blood gas, arterial     Status: Abnormal   Collection Time: 04/26/19  2:37 AM  Result Value Ref Range   FIO2 32.00    pH, Arterial 7.387 7.350 - 7.450   pCO2 arterial 50.1 (H) 32.0 - 48.0 mmHg   pO2, Arterial 89.6 83.0 - 108.0 mmHg   Bicarbonate 28.0 20.0 - 28.0 mmol/L   Acid-Base Excess 4.7 (H) 0.0 - 2.0 mmol/L   O2 Saturation 96.3 %   Patient temperature 37.0    Allens test (pass/fail) PASS PASS    Comment: Performed at [facility], [address], [place], [place] [address]  SARS Coronavirus 2 [facility] order, Performed in [facility] hospital lab) Nasopharyngeal Nasopharyngeal Swab     Status: None   Collection Time: 04/26/19  4:27 AM   Specimen: Nasopharyngeal Swab  Result Value Ref Range   SARS Coronavirus 2 NEGATIVE NEGATIVE    Comment: (NOTE) If result is NEGATIVE SARS-CoV-2 target nucleic acids are NOT DETECTED. The SARS-CoV-2 RNA is generally detectable in upper and lower  respiratory specimens during the acute phase of infection. The lowest  concentration of SARS-CoV-2 viral copies this assay can detect is 250   copies / mL. A negative result does not preclude SARS-CoV-2 infection  and should not be used as the sole basis for treatment or other  patient management decisions.  A negative result may occur with  improper specimen collection / handling, submission of specimen other  than nasopharyngeal swab, presence of viral mutation(s) within the  areas targeted by this assay, and inadequate number of viral copies  (<250 copies / mL). A negative result must be combined with clinical  observations, patient history, and epidemiological information. If result is POSITIVE SARS-CoV-2 target nucleic acids are DETECTED. The SARS-CoV-2 RNA is generally detectable in upper and lower  respiratory specimens dur ing the acute phase of  infection.  Positive  results are indicative of active infection with SARS-CoV-2.  Clinical  correlation with patient history and other diagnostic information is  necessary to determine patient infection status.  Positive results do  not rule out bacterial infection or co-infection with other viruses. If result is PRESUMPTIVE POSTIVE SARS-CoV-2 nucleic acids MAY BE PRESENT.   A presumptive positive result was obtained on the submitted specimen  and confirmed on repeat testing.  While 2019 novel coronavirus  (SARS-CoV-2) nucleic acids may be present in the submitted sample  additional confirmatory testing may be necessary for epidemiological  and / or clinical management purposes  to differentiate between  SARS-CoV-2 and other Sarbecovirus currently known to infect humans.  If clinically indicated additional testing with an alternate test  methodology [phone]) is advised. The SARS-CoV-2 RNA is generally  detectable in upper and lower respiratory sp ecimens during the acute  phase of infection. The expected result is Negative. Fact Sheet for Patients:  StrictlyIdeas.no Fact Sheet for Healthcare Providers: BankingDealers.co.za  This test is not yet approved or cleared by the [place] FDA and has been authorized for detection and/or diagnosis of SARS-CoV-2 by FDA under an Emergency Use Authorization (EUA).  This EUA will remain in effect (meaning this test can be used) for the duration of the COVID-19 declaration under Section 564(b)(1) of the Act, 21 U.S.C. section 360bbb-3(b)(1), unless the authorization is terminated or revoked sooner. Performed at [facility], [address] [place], [place] [address]     Chemistries  Recent Labs  Lab 04/26/19 0200  NA 131*  K 3.7  CL 95*  CO2 26  GLUCOSE 360*  BUN 8  CREATININE 1.07  CALCIUM 8.9  AST 14*  ALT 14  ALKPHOS 103  BILITOT 0.6   ------------------------------------------------------------------------------------------------------------------  ------------------------------------------------------------------------------------------------------------------ GFR: CrCl cannot be calculated (Unknown ideal weight.). Liver Function Tests: Recent Labs  Lab 04/26/19 0200  AST 14*  ALT 14  ALKPHOS 103  BILITOT 0.6  PROT 7.0  ALBUMIN 3.8   No results for input(s): LIPASE, AMYLASE in the last 168 hours. Recent Labs  Lab 04/26/19 0200  AMMONIA 16   Coagulation Profile: No results for input(s): INR, PROTIME in the last 168 hours. Cardiac Enzymes: No results for input(s): CKTOTAL, CKMB, CKMBINDEX, TROPONINI in the last 168 hours. BNP (last 3 results) No results for input(s): PROBNP in the last 8760 hours. HbA1C: No results for input(s): HGBA1C in the last 72 hours. CBG: Recent Labs  Lab 04/26/19 0151  GLUCAP 352*   Lipid Profile: No results for input(s): CHOL, HDL, LDLCALC, TRIG, CHOLHDL, LDLDIRECT in the last 72 hours. Thyroid Function Tests: No results for input(s): TSH, T4TOTAL, FREET4, T3FREE, THYROIDAB in the last 72 hours. Anemia Panel: No results for input(s): VITAMINB12, FOLATE, FERRITIN, TIBC, IRON, RETICCTPCT in the last  72 hours.  --------------------------------------------------------------------------------------------------------------- Urine analysis:    Component Value Date/Time   COLORURINE YELLOW 04/26/2019 0216   APPEARANCEUR CLEAR 04/26/2019 0216   LABSPEC 1.006 04/26/2019 0216   PHURINE 5.0 04/26/2019 0216   GLUCOSEU >=500 (A) 04/26/2019 0216   HGBUR NEGATIVE 04/26/2019 0216   BILIRUBINUR NEGATIVE 04/26/2019 0216   KETONESUR NEGATIVE 04/26/2019 0216   PROTEINUR NEGATIVE 04/26/2019 0216   UROBILINOGEN 1.0 12/05/2014 1519   NITRITE NEGATIVE 04/26/2019 0216   LEUKOCYTESUR NEGATIVE 04/26/2019 0216      Imaging Results:    Ct Head Wo Contrast  Result Date: 04/26/2019 CLINICAL DATA:  Encephalopathy EXAM: CT HEAD WITHOUT CONTRAST TECHNIQUE: Contiguous axial images were obtained  from the base of the skull through the vertex without intravenous contrast. COMPARISON:  11/22/2018 FINDINGS: Brain: There is no mass, hemorrhage or extra-axial collection. There is generalized atrophy without lobar predilection. Hypodensity of the white matter is most commonly associated with chronic microvascular disease. Vascular: No abnormal hyperdensity of the major intracranial arteries or dural venous sinuses. No intracranial atherosclerosis. Skull: The visualized skull base, calvarium and extracranial soft tissues are normal. Sinuses/Orbits: No fluid levels or advanced mucosal thickening of the visualized paranasal sinuses. No mastoid or middle ear effusion. The orbits are normal. IMPRESSION: Atrophy and chronic microvascular ischemia without acute intracranial abnormality. Electronically Signed   By: Ulyses Jarred M.D.   On: 04/26/2019 03:55   Dg Chest Port 1 View  Result Date: 04/26/2019 CLINICAL DATA:  Fusion EXAM: PORTABLE CHEST 1 VIEW COMPARISON:  11/21/2010 FINDINGS: Cardiac shadow is within normal limits. Aortic calcifications are noted. The lungs are well aerated bilaterally. No focal infiltrate or sizable  effusion is seen. IMPRESSION: No acute abnormality noted. Electronically Signed   By: Inez Catalina M.D.   On: 04/26/2019 02:44    ST at 107, nl axis, incomplete RBBB   Assessment & Plan:    Principal Problem:   Altered mental status Active Problems:   Obstructive sleep apnea   Essential hypertension   Type 2 diabetes mellitus without complication, with long-term current use of insulin (HCC)   Chronic respiratory failure 02 dep with hypercarbia   Chronic diastolic CHF (congestive heart failure) (HCC)  AMS Check b12, folate, esr, ana, rpr, tsh Check MRI brain May be having subtle decline in mental status,  ? Early dementia  Tachycardia, -> currently resolved Monitor  Chronic respiratory failure with hypercapnea Copd on home o2 Chronic diastolic CHF (EF 96%) Cont Lasix 36m po qam, and 469mpo qpm  Pafib Cont Eliquis 41m54mo bid  Dm2 Normally on insulin pump Hold Farxiga 41mg53m qday (not available at [facility] Ozempic (not available at [facility] L[facility] 20 units [place] qday Cont Metformin 750mg46mqday fsbs ac and qhs, ISS  Dm2 neuropathy and chronic back pain Cont Gabapentin 300mg 33mid   , might be taking qid Tramadol 50mg p54md prn   Hyperlipidemia Cont Simvastatin 40mg po53m  OSA, noncompliant with CPAP Copd on home o2 Albuterol neb q6h prn   Depression Cont Cymbalta 30mg po 46m Cont Trazodone 100mg po q2mGerd Cont Protonix 40mg po qd53m DVT Prophylaxis-   Cont Eliquis  AM Labs Ordered, also please review Full Orders  Family Communication: Admission, patients condition and plan of care including tests being ordered have been discussed with the patient who indicate understanding and agree with the plan and Code Status.  Code Status:  FULL CODE, wife present with patient  Admission status: Observation: Based on patients clinical presentation and evaluation of above clinical data, I have made determination that patient meets observeration criteria at  this time.     Time spent in minutes : 70   Delos Klich MJani Gravel/2020 at 6:23 AM

## 2019-04-26 NOTE — ED Triage Notes (Signed)
Wife presents to er with spouse for gradual onset of confusion, wife states that pt has pulled his insulin pump several times over the past few days, pt mumbles in conversation in triage,

## 2019-04-26 NOTE — ED Notes (Signed)
Pt continues to take off leads

## 2019-04-26 NOTE — BH Assessment (Signed)
Faxed clinical information to the following facilities for placement:  [facility]  [facility]  [facility]  [facility], [facility], [facility], [facility] Triage Specialist [phone]

## 2019-04-27 ENCOUNTER — Observation Stay (HOSPITAL_COMMUNITY): Payer: Medicare Other

## 2019-04-27 DIAGNOSIS — J9612 Chronic respiratory failure with hypercapnia: Secondary | ICD-10-CM | POA: Diagnosis not present

## 2019-04-27 DIAGNOSIS — I10 Essential (primary) hypertension: Secondary | ICD-10-CM | POA: Diagnosis not present

## 2019-04-27 DIAGNOSIS — R4182 Altered mental status, unspecified: Secondary | ICD-10-CM | POA: Diagnosis not present

## 2019-04-27 DIAGNOSIS — J9611 Chronic respiratory failure with hypoxia: Secondary | ICD-10-CM | POA: Diagnosis not present

## 2019-04-27 LAB — CBC
HCT: 39.4 % (ref 39.0–52.0)
Hemoglobin: 11.5 g/dL — ABNORMAL LOW (ref 13.0–17.0)
MCH: 24.7 pg — ABNORMAL LOW (ref 26.0–34.0)
MCHC: 29.2 g/dL — ABNORMAL LOW (ref 30.0–36.0)
MCV: 84.7 fL (ref 80.0–100.0)
Platelets: 281 10*3/uL (ref 150–400)
RBC: 4.65 MIL/uL (ref 4.22–5.81)
RDW: 14.1 % (ref 11.5–15.5)
WBC: 12.2 10*3/uL — ABNORMAL HIGH (ref 4.0–10.5)
nRBC: 0 % (ref 0.0–0.2)

## 2019-04-27 LAB — LIPID PANEL
Cholesterol: 245 mg/dL — ABNORMAL HIGH (ref 0–200)
HDL: 34 mg/dL — ABNORMAL LOW (ref >40)
LDL Cholesterol: 160 mg/dL — ABNORMAL HIGH (ref 0–99)
Total CHOL/HDL Ratio: 7.2 RATIO
Triglycerides: 256 mg/dL — ABNORMAL HIGH (ref <150)
VLDL: 51 mg/dL — ABNORMAL HIGH (ref 0–40)

## 2019-04-27 LAB — COMPREHENSIVE METABOLIC PANEL
ALT: 13 U/L (ref 0–44)
AST: 11 U/L — ABNORMAL LOW (ref 15–41)
Albumin: 3.7 g/dL (ref 3.5–5.0)
Alkaline Phosphatase: 97 U/L (ref 38–126)
Anion gap: 9 (ref 5–15)
BUN: 7 mg/dL — ABNORMAL LOW (ref 8–23)
CO2: 29 mmol/L (ref 22–32)
Calcium: 8.8 mg/dL — ABNORMAL LOW (ref 8.9–10.3)
Chloride: 97 mmol/L — ABNORMAL LOW (ref 98–111)
Creatinine, Ser: 0.9 mg/dL (ref 0.61–1.24)
GFR calc Af Amer: >60 mL/min (ref >60)
GFR calc non Af Amer: >60 mL/min (ref >60)
Glucose, Bld: 285 mg/dL — ABNORMAL HIGH (ref 70–99)
Potassium: 4.2 mmol/L (ref 3.5–5.1)
Sodium: 135 mmol/L (ref 135–145)
Total Bilirubin: 0.9 mg/dL (ref 0.3–1.2)
Total Protein: 6.7 g/dL (ref 6.5–8.1)

## 2019-04-27 LAB — RPR: RPR Ser Ql: NONREACTIVE

## 2019-04-27 LAB — CBG MONITORING, ED
Glucose-Capillary: 314 mg/dL — ABNORMAL HIGH (ref 70–99)
Glucose-Capillary: 270 mg/dL — ABNORMAL HIGH (ref 70–99)
Glucose-Capillary: 300 mg/dL — ABNORMAL HIGH (ref 70–99)

## 2019-04-27 LAB — URINE CULTURE: Culture: NO GROWTH

## 2019-04-27 LAB — ANA: Anti Nuclear Antibody (ANA): NEGATIVE

## 2019-04-27 MED ORDER — NOVOLOG FLEXPEN 100 UNIT/ML ~~LOC~~ SOPN: 15.0000 [IU] | PEN_INJECTOR | Freq: Three times a day (TID) | SUBCUTANEOUS | 3 refills | Status: DC

## 2019-04-27 MED ORDER — HYDROCODONE-ACETAMINOPHEN 5-325 MG PO TABS
1.5000 | ORAL_TABLET | Freq: Four times a day (QID) | ORAL | Status: DC | PRN
  Administered 2019-04-27: 14:00:00 1.5 via ORAL
  Filled 2019-04-27: qty 2

## 2019-04-27 MED ORDER — LANTUS SOLOSTAR 100 UNIT/ML ~~LOC~~ SOPN: 35.0000 [IU] | PEN_INJECTOR | Freq: Two times a day (BID) | SUBCUTANEOUS | 3 refills | Status: DC

## 2019-04-27 NOTE — Clinical Social Work Note (Signed)
Confirmed with nursing that, in fact, patient is not being admitted as observation.  Will remain in ED awaiting placement for psych.

## 2019-04-27 NOTE — ED Notes (Signed)
Rick Cruz Rick Cruz

## 2019-04-27 NOTE — BH Assessment (Signed)
Reassessment-- Attempted to call wife, Rick Cruz at [phone], and no one answered, mailbox was full. Pt was lying in bed drinking a drink. He said that he was doing well, denies depression, SI, HI, AVH, SA. When probed further, he admitted to being depressed since his son died in their home 3 years ago from blood clots in his legs. He states, "my wife and I have both been depressed. He states that his wife thniks he needs help. He states that she thinks that it is crazy that he recently bought a car when he hasn't driven in several years. When asked if he can understand her concern, pt stated, "yes, but I can understand my point of view, too--I think I am ready to drive again".  When asked about not taking care of his health care needs, he denies being on dialysis (mentioned in chart), and admits to pulling out his insulin pump and not using his oxygen sometimes. He states that he is unaware that he is doing this, denies that it is intentional.  Pt was calm and cooperative during assessment. Pt's mood is pleasant, affect is congruent. His voice was soft, speech slightly slow. Pt is oriented x4, movement is slightly slow. There is no evidence pt is responding to internal stimuli or delusional thoughts.  TTS continues to seek placement.

## 2019-04-27 NOTE — ED Notes (Signed)
Pt in MRI.

## 2019-04-27 NOTE — Progress Notes (Signed)
Patient Demographics:    Rick Cruz, is a 73 y.o. male, DOB - 1946-05-26, BJY:[id number]  Admit date - 04/26/2019   Admitting Physician No admitting provider for patient encounter.  Outpatient Primary MD for the patient is [name], MD  LOS - 0   Chief Complaint  Patient presents with  . Altered Mental Status        Subjective:    [name] today has no fevers, no emesis,  No chest pain, resting comfortably, apparently had episodes of agitation and restlessness earlier  Assessment  & Plan :    Principal Problem:   Altered mental status Active Problems:   Obstructive sleep apnea   Essential hypertension   Type 2 diabetes mellitus without complication, with long-term current use of insulin (HCC)   Chronic respiratory failure 02 dep with hypercarbia   Chronic diastolic CHF (congestive heart failure) ([place])   Brief Summary 73 y.o.male,w Depression (son died from drug overdose), ? Schizophrenia , Chronic lower back pain, hypertension, hyperlipidemia, Dm2 (YQM5H=8.4), CHF (diastolic), OSA noncompliant with Cpap, Copd on home o2, presents with altered mental status per his wife--admitted on 04/26/2019 with concerns about acute metabolic encephalopathy.  Patient has been medically cleared awaiting transfer to inpatient psych unit  -- A/p 1)Depression--with adjustment disorder since death of his son--leading to noncompliance with medical therapy --Psychiatry consultation appreciated -Continue Cymbalta and trazodone - TTS recommends inpatient treatment at a geriatric-psychiatry facility  2)Dementia--- work-up without evidence of reversible etiology for his dementia --- CT head and brain MRI without acute findings, TSH is 2.6, B12 395, ESR 45, RPR Neg,  - 3)DM2-uncontrolled, A1c was 11.4 in March 2020,  PTA was on insulin pump, okay to continue Lantus insulin along with sliding scale coverage   4) acute metabolic encephalopathy--- overall from medical standpoint patient is currently stable. 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  --No acute medical problems to preclude transfer to psychiatric service at this time -Without psychiatric intervention patient is unlikely to be compliant with his medical therapy for chronic comorbid conditions. --A   Disposition/Need for in-Hospital Stay- patient unable to be discharged at this time due to Patient is medically cleared for transfer to inpatient psychiatric unit  by psychiatry --Awaiting psychiatric bed  Code Status : Full  Family Communication:   (patient is alert, awake and coherent) --Attempted to reach patient's wife, her cell phone voicemail is full she did not understand the home phone  Disposition Plan  : Inpatient psych  Consults  : Psychiatry  DVT Prophylaxis  : eliquis  Lab Results  Component Value Date   PLT 281 04/27/2019    Inpatient Medications  Scheduled Meds: . apixaban  5 mg  Oral BID  . docusate sodium  100 mg Oral BID  . DULoxetine  30 mg Oral Daily  . furosemide  40 mg Oral QPM  . furosemide  60 mg Oral Daily  . gabapentin  300 mg Oral TID  . insulin aspart  0-5 Units Subcutaneous QHS  . insulin aspart  0-9 Units  Subcutaneous TID WC  . metFORMIN  750 mg Oral Q breakfast  . pantoprazole  40 mg Oral BID  . simvastatin  40 mg Oral QPM  . sodium chloride flush  3 mL Intravenous Q12H  . traZODone  100 mg Oral QHS   Continuous Infusions: . sodium chloride     PRN Meds:.sodium chloride, acetaminophen **OR** acetaminophen, albuterol, HYDROcodone-acetaminophen, sodium chloride flush   Anti-infectives (From admission, onward)   None        Objective:   Vitals:   04/26/19 0700 04/26/19 0730 04/26/19 2030 04/27/19 0653  BP: (!) 141/70 (!) 144/69 130/81 137/78  Pulse: (!) 106 96 97 (!) 102  Resp:   16 18  Temp:    98 F (36.7 C)  TempSrc:    Oral  SpO2: 100% 100% 96% 100%    Wt Readings from Last 3 Encounters:  11/25/18 121.9 kg  01/13/18 136.1 kg  08/26/17 131.5 kg     Intake/Output Summary (Last 24 hours) at 04/27/2019 1403 Last data filed at 04/27/2019 0944 Gross per 24 hour  Intake 3 ml  Output 475 ml  Net -472 ml     Physical Exam  Gen:- Awake Alert,  , morbidly obese appearing HEENT:- [place].AT, No sclera icterus Neck-Supple Neck,No JVD,.  Lungs-  CTAB , fair symmetrical air movement CV- S1, S2 normal, regular  Abd-  +ve B.Sounds, Abd Soft, No tenderness,    Extremity/Skin:- No  edema, pedal pulses present  Psych-affect is flat, oriented x3 Neuro-no new focal deficits, no tremors   Data Review:   Micro Results Recent Results (from the past 240 hour(s))  SARS Coronavirus 2 [facility] order, Performed in [facility] hospital lab) Nasopharyngeal Nasopharyngeal Swab     Status: None   Collection Time: 04/26/19  4:27 AM   Specimen: Nasopharyngeal Swab  Result Value Ref Range Status   SARS Coronavirus 2 NEGATIVE NEGATIVE Final    Comment: (NOTE) If result is NEGATIVE SARS-CoV-2 target nucleic acids are NOT DETECTED. The SARS-CoV-2 RNA is generally detectable in upper and lower  respiratory specimens during the acute phase of infection. The lowest  concentration of  SARS-CoV-2 viral copies this assay can detect is 250  copies / mL. A negative result does not preclude SARS-CoV-2 infection  and should not be used as the sole basis for treatment or other  patient management decisions.  A negative result may occur with  improper specimen collection / handling, submission of specimen other  than nasopharyngeal swab, presence of viral mutation(s) within the  areas targeted by this assay, and inadequate number of viral copies  (<250 copies / mL). A negative result must be combined with clinical  observations, patient history, and epidemiological information. If result is POSITIVE SARS-CoV-2 target nucleic acids are DETECTED. The SARS-CoV-2 RNA is generally detectable in upper and lower  respiratory specimens dur ing the acute phase of infection.  Positive  results are indicative of active infection with SARS-CoV-2.  Clinical  correlation with patient history and other diagnostic information is  necessary to determine patient infection status.  Positive results do  not rule out bacterial infection or co-infection with other  viruses. If result is PRESUMPTIVE POSTIVE SARS-CoV-2 nucleic acids MAY BE PRESENT.   A presumptive positive result was obtained on the submitted specimen  and confirmed on repeat testing.  While 2019 novel coronavirus  (SARS-CoV-2) nucleic acids may be present in the submitted sample  additional confirmatory testing may be necessary for epidemiological  and / or clinical management purposes  to differentiate between  SARS-CoV-2 and other Sarbecovirus currently known to infect humans.  If clinically indicated additional testing with an alternate test  methodology [id number]) is advised. The SARS-CoV-2 RNA is generally  detectable in upper and lower respiratory sp ecimens during the acute  phase of infection. The expected result is Negative. Fact Sheet for Patients:  StrictlyIdeas.no Fact Sheet for Healthcare  Providers: BankingDealers.co.za This test is not yet approved or cleared by the [place] FDA and has been authorized for detection and/or diagnosis of SARS-CoV-2 by FDA under an Emergency Use Authorization (EUA).  This EUA will remain in effect (meaning this test can be used) for the duration of the COVID-19 declaration under Section 564(b)(1) of the Act, 21 U.S.C. section 360bbb-3(b)(1), unless the authorization is terminated or revoked sooner. Performed at [facility], [address], [place], [place] [address]     Radiology Reports Ct Head Wo Contrast  Result Date: 04/26/2019 CLINICAL DATA:  Encephalopathy EXAM: CT HEAD WITHOUT CONTRAST TECHNIQUE: Contiguous axial images were obtained from the base of the skull through the vertex without intravenous contrast. COMPARISON:  11/22/2018 FINDINGS: Brain: There is no mass, hemorrhage or extra-axial collection. There is generalized atrophy without lobar predilection. Hypodensity of the white matter is most commonly associated with chronic microvascular disease. Vascular: No abnormal hyperdensity of the major intracranial arteries or dural venous sinuses. No intracranial atherosclerosis. Skull: The visualized skull base, calvarium and extracranial soft tissues are normal. Sinuses/Orbits: No fluid levels or advanced mucosal thickening of the visualized paranasal sinuses. No mastoid or middle ear effusion. The orbits are normal. IMPRESSION: Atrophy and chronic microvascular ischemia without acute intracranial abnormality. Electronically Signed   By: Ulyses Jarred M.D.   On: 04/26/2019 03:55   Mr Brain Wo Contrast  Result Date: 04/27/2019 CLINICAL DATA:  Altered mental status. EXAM: MRI HEAD WITHOUT CONTRAST TECHNIQUE: Multiplanar, multiecho pulse sequences of the brain and surrounding structures were obtained without intravenous contrast. COMPARISON:  Head CT 04/26/2019 FINDINGS: Some sequences are moderately motion degraded.  Brain: There is no evidence of acute infarct, intracranial hemorrhage, mass, midline shift, or extra-axial fluid collection. Patchy to confluent T2 hyperintensities in the cerebral white matter bilaterally are nonspecific but compatible with moderate chronic small vessel ischemic disease. Generalized cerebral atrophy is mild-to-moderate for age. Vascular: Major intracranial vascular flow voids are preserved. Skull and upper cervical spine: Unremarkable bone marrow signal. Sinuses/Orbits: Bilateral cataract extraction. Trace left mastoid effusion. Clear paranasal sinuses. Other: None. IMPRESSION: 1. Motion degraded examination without evidence of acute intracranial abnormality. 2. Moderate chronic small vessel ischemic disease and cerebral atrophy. Electronically Signed   By: Logan Bores M.D.   On: 04/27/2019 08:18   Dg Chest Port 1 View  Result Date: 04/26/2019 CLINICAL DATA:  Fusion EXAM: PORTABLE CHEST 1 VIEW COMPARISON:  11/21/2010 FINDINGS: Cardiac shadow is within normal limits. Aortic calcifications are noted. The lungs are well aerated bilaterally. No focal infiltrate or sizable effusion is seen. IMPRESSION: No acute abnormality noted. Electronically Signed   By: Inez Catalina M.D.   On: 04/26/2019 02:44     CBC Recent Labs  Lab 04/26/19 0200 04/27/19 0430  WBC 16.7* 12.2*  HGB 11.9* 11.5*  HCT 39.0 39.4  PLT 355 281  MCV 82.1 84.7  MCH 25.1* 24.7*  MCHC 30.5 29.2*  RDW 14.2 14.1  LYMPHSABS 2.6  --   MONOABS 1.0  --   EOSABS 0.2  --   BASOSABS 0.1  --     Chemistries  Recent Labs  Lab 04/26/19 0200 04/27/19 0430  NA 131* 135  K 3.7 4.2  CL 95* 97*  CO2 26 29  GLUCOSE 360* 285*  BUN 8 7*  CREATININE 1.07 0.90  CALCIUM 8.9 8.8*  AST 14* 11*  ALT 14 13  ALKPHOS 103 97  BILITOT 0.6 0.9   ------------------------------------------------------------------------------------------------------------------ Recent Labs    04/27/19 0430  CHOL 245*  HDL 34*  LDLCALC 160*   TRIG 256*  CHOLHDL 7.2    Lab Results  Component Value Date   HGBA1C 11.4 (H) 11/22/2018   ------------------------------------------------------------------------------------------------------------------ Recent Labs    04/26/19 0200  TSH 2.620   ------------------------------------------------------------------------------------------------------------------ Recent Labs    04/26/19 0743  VITAMINB12 395    Coagulation profile No results for input(s): INR, PROTIME in the last 168 hours.  No results for input(s): DDIMER in the last 72 hours.  Cardiac Enzymes No results for input(s): CKMB, TROPONINI, MYOGLOBIN in the last 168 hours.  Invalid input(s): CK ------------------------------------------------------------------------------------------------------------------    Component Value Date/Time   BNP 29.0 11/21/2018 1601     Garnetta Fedrick M.D on 04/27/2019 at 2:03 PM  Go to [email or web address] - for contact info  [facility] - Office  [phone]

## 2019-04-27 NOTE — ED Notes (Signed)
Have given pt.s wife an update on his care. She is attempting to contact [facility]

## 2019-04-27 NOTE — ED Notes (Signed)
Pt had a BM in the bed. Have cleaned up patient, changed sheets, as well as changed gown

## 2019-04-27 NOTE — ED Notes (Signed)
Will give Insulin when breakfast trays arrive

## 2019-04-27 NOTE — Progress Notes (Signed)
Inpatient Diabetes Program Recommendations  AACE/ADA: New Consensus Statement on Inpatient Glycemic Control (2015)  Target Ranges:  Prepandial:   less than 140 mg/dL      Peak postprandial:   less than 180 mg/dL (1-2 hours)      Critically ill patients:  140 - 180 mg/dL   Lab Results  Component Value Date   GLUCAP 314 (H) 04/27/2019   HGBA1C 11.4 (H) 11/22/2018    Review of Glycemic Control  Diabetes history: DM2 Outpatient Diabetes medications: Lantus 35 units bid, Novolog 15-25 units tidwc, metformin 750 mg QAM Current orders for Inpatient glycemic control: Novolog 0-9 units tidwc and hs, metformin 750 mg QAM  HgbA1C - 11.4% - uncontrolled  Inpatient Diabetes Program Recommendations:     Add 1/2 home dose of Lantus - 35 units QHS Add Novolog 5 units tidwc for meal coverage insulin if pt eats > 50% meal.   Will continue to follow.  Thank you. Lorenda Peck, RD, LDN, CDE Inpatient Diabetes Coordinator [phone]

## 2019-04-27 NOTE — Consult Note (Signed)
Telepsych Consultation   Reason for Consult: Altered mental status/agitation  Referring Physician: [name] EDP  Location of Patient: [facility] hospital ED.  Location of Provider: [place] [facility]  Patient Identification: KALOYAN Cruz  MRN:  [id number]  Principal Diagnosis: Altered mental status  Diagnosis:  Principal Problem:   Altered mental status Active Problems:   Obstructive sleep apnea   Essential hypertension   Type 2 diabetes mellitus without complication, with long-term current use of insulin (HCC)   Chronic respiratory failure 02 dep with hypercarbia   Chronic diastolic CHF (congestive heart failure) ([place])   Total Time spent with patient: 45 minutes  Subjective:   Rick Cruz is a 73 y.o. male patient admitted with hx of agitation altered mental status. He was brought to the [facility] ED by spouse with complaints of altered mental status & agitation. He was recommended for evaluations.  HPI: Upon this tele-psychiatric evaluation of this 73 year old Caucasian male, he reports, "I was brought to the hospital because of my few behavioral problems. I was refusing to take my medicines. I believe I was messing with my insulin pump as well. I cannot tell you why I was acting like that, but it was not because I was depressed. I'm not depressed. I'm feeling a lot better now. I live with my wife. I want to go home. I'm only anxious about going home. My wife will come get me once I get discharged. I'm not feeling suicidal or feeling like hurting anyone. I heard voices a long time ago, not any more.  Past Psychiatric History: Altered mental status.  Risk to Self: Suicidal Ideation: No(Pt denied; but see notes) Is patient at risk for suicide?: (See notes) Suicidal Plan?: No Access to Means: No What has been your use of drugs/alcohol within the last 12 months?: Denied Intentional Self Injurious Behavior: None(See notes) Risk to Others: Homicidal  Ideation: No Thoughts of Harm to Others: No Current Homicidal Intent: No Current Homicidal Plan: No Access to Homicidal Means: No History of harm to others?: No Assessment of Violence: None Noted Does patient have access to weapons?: No Criminal Charges Pending?: No Does patient have a court date: No Prior Inpatient Therapy: Prior Inpatient Therapy: Yes Prior Therapy Dates: (Could not recall) Prior Therapy Facilty/Provider(s): (Could not recall) Reason for Treatment: (Could not recall) Prior Outpatient Therapy: Prior Outpatient Therapy: Yes Prior Therapy Dates: (Could not recall) Prior Therapy Facilty/Provider(s): (Could not recall) Reason for Treatment: (Could not recall) Does patient have an ACCT team?: No Does patient have Intensive In-House Services?  : No Does patient have [facility] services? : No Does patient have P4CC services?: No  Past Medical History:  Past Medical History:  Diagnosis Date  . Arthritis   . 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 CHF (congestive heart failure) (HCC)    diastolic   . Chronic pain   . COPD (chronic obstructive pulmonary disease) ([place])   . Depression   . Diabetes mellitus without complication (HCC)    insulin pump  . Hypercholesterolemia   . Hypertension   . On home O2    3 liters  . Oxygen deficiency   . Schizophrenia ([place])   . Sleep apnea    noncompliant with BiPAP  . Sleep apnea   . Tobacco abuse     Past Surgical History:  Procedure Laterality Date  . COLON SURGERY  March 2010   secondary to large colon polyp, final  path per discharge summary notes tubulovillous adenoma.   . COLONOSCOPY  July 2011   Dr. Benson Norway: multiple polyps, internal and external hemorrhoids, diverticulosis. tubular adenoma  . ESOPHAGOGASTRODUODENOSCOPY (EGD) WITH PROPOFOL N/A 04/20/2013   Procedure: ESOPHAGOGASTRODUODENOSCOPY (EGD) WITH PROPOFOL;  Surgeon: Daneil Dolin, MD;  Location: [facility];  Service: Endoscopy;  Laterality: N/A;  . KNEE SURGERY      X2   Family History:  Family History  Problem Relation Age of Onset  . Thyroid disease Mother   . Parkinson's disease Father   . Parkinson's disease Other   . Colon cancer Neg Hx    Family Psychiatric  History: None reported. Social History:  Social History   Substance and Sexual Activity  Alcohol Use No   Comment: history of ETOH abuse in remote past, none in at least 10 years     Social History   Substance and Sexual Activity  Drug Use No    Social History   Socioeconomic History  . Marital status: Married    Spouse name: Not on file  . Number of children: Not on file  . Years of education: Not on file  . Highest education level: Not on file  Occupational History  . Occupation: Retired  Scientific laboratory technician  . Financial resource strain: Not on file  . Food insecurity    Worry: Not on file    Inability: Not on file  . Transportation needs    Medical: Not on file    Non-medical: Not on file  Tobacco Use  . Smoking status: Former Smoker    Packs/day: 1.00    Years: 50.00    Pack years: 50.00    Types: Cigarettes    Quit date: 09/16/2013    Years since quitting: 5.6  . Smokeless tobacco: Never Used  Substance and Sexual Activity  . Alcohol use: No    Comment: history of ETOH abuse in remote past, none in at least 10 years  . Drug use: No  . Sexual activity: Yes  Lifestyle  . Physical activity    Days per week: Not on file    Minutes per session: Not on file  . Stress: Not on file  Relationships  . Social Herbalist on phone: Not on file    Gets together: Not on file    Attends religious service: Not on file    Active member of club or organization: Not on file    Attends meetings of clubs or organizations: Not on file    Relationship status: Not on file  Other Topics Concern  . Not on file  Social History Narrative   Drinks about 4 cups of caffeine daily. 2 cups of coffee and 2 coke zeros.  Pt lives in [place] with wife Rick Cruz  [phone]) and mother.   Additional Social History:  Allergies:   Allergies  Allergen Reactions  . Phenergan [Promethazine Hcl] Other (See Comments)    Becomes very confused and aggressive  . Haldol [Haloperidol Lactate] Other (See Comments)    Shaking   . Metformin And Related Diarrhea    Labs:  Results for orders placed or performed during the hospital encounter of 04/26/19 (from the past 48 hour(s))  CBG monitoring, ED     Status: Abnormal   Collection Time: 04/26/19  1:51 AM  Result Value Ref Range   Glucose-Capillary 352 (H) 70 - 99 mg/dL   Comment 1 Document in Chart   CBC with Differential/Platelet  Status: Abnormal   Collection Time: 04/26/19  2:00 AM  Result Value Ref Range   WBC 16.7 (H) 4.0 - 10.5 K/uL   RBC 4.75 4.22 - 5.81 MIL/uL   Hemoglobin 11.9 (L) 13.0 - 17.0 g/dL   HCT 39.0 39.0 - 52.0 %   MCV 82.1 80.0 - 100.0 fL   MCH 25.1 (L) 26.0 - 34.0 pg   MCHC 30.5 30.0 - 36.0 g/dL   RDW 14.2 11.5 - 15.5 %   Platelets 355 150 - 400 K/uL   nRBC 0.0 0.0 - 0.2 %   Neutrophils Relative % 75 %   Neutro Abs 12.7 (H) 1.7 - 7.7 K/uL   Lymphocytes Relative 16 %   Lymphs Abs 2.6 0.7 - 4.0 K/uL   Monocytes Relative 6 %   Monocytes Absolute 1.0 0.1 - 1.0 K/uL   Eosinophils Relative 1 %   Eosinophils Absolute 0.2 0.0 - 0.5 K/uL   Basophils Relative 1 %   Basophils Absolute 0.1 0.0 - 0.1 K/uL   Immature Granulocytes 1 %   Abs Immature Granulocytes 0.08 (H) 0.00 - 0.07 K/uL    Comment: Performed at [facility], [address], [place], [place] [address]  Comprehensive metabolic panel     Status: Abnormal   Collection Time: 04/26/19  2:00 AM  Result Value Ref Range   Sodium 131 (L) 135 - 145 mmol/L   Potassium 3.7 3.5 - 5.1 mmol/L   Chloride 95 (L) 98 - 111 mmol/L   CO2 26 22 - 32 mmol/L   Glucose, Bld 360 (H) 70 - 99 mg/dL   BUN 8 8 - 23 mg/dL   Creatinine, Ser 1.07 0.61 - 1.24 mg/dL   Calcium 8.9 8.9 - 10.3 mg/dL   Total Protein 7.0 6.5 - 8.1 g/dL   Albumin  3.8 3.5 - 5.0 g/dL   AST 14 (L) 15 - 41 U/L   ALT 14 0 - 44 U/L   Alkaline Phosphatase 103 38 - 126 U/L   Total Bilirubin 0.6 0.3 - 1.2 mg/dL   GFR calc non Af Amer >60 >60 mL/min   GFR calc Af Amer >60 >60 mL/min   Anion gap 10 5 - 15    Comment: Performed at [facility], [address] [place], [place] [address]  Troponin I (High Sensitivity)     Status: None   Collection Time: 04/26/19  2:00 AM  Result Value Ref Range   Troponin I (High Sensitivity) 8 <18 ng/L    Comment: (NOTE) Elevated high sensitivity troponin I (hsTnI) values and significant  changes across serial measurements may suggest ACS but many other  chronic and acute conditions are known to elevate hsTnI results.  Refer to the "Links" section for chest pain algorithms and additional  guidance. Performed at [facility], [address], [place], [place] [address]   Lactic acid, plasma     Status: None   Collection Time: 04/26/19  2:00 AM  Result Value Ref Range   Lactic Acid, Venous 1.6 0.5 - 1.9 mmol/L    Comment: Performed at [facility], [address], [place], [place] [address]  Ethanol     Status: None   Collection Time: 04/26/19  2:00 AM  Result Value Ref Range   Alcohol, Ethyl (B) <10 <10 mg/dL    Comment: (NOTE) Lowest detectable limit for serum alcohol is 10 mg/dL. For medical purposes only. Performed at [facility], [address], [place], [place] [address]   Ammonia  Status: None   Collection Time: 04/26/19  2:00 AM  Result Value Ref Range   Ammonia 16 9 - 35 umol/L    Comment: Performed at [facility], [address], [place], [place] [address]  Beta-hydroxybutyric acid     Status: Abnormal   Collection Time: 04/26/19  2:00 AM  Result Value Ref Range   Beta-Hydroxybutyric Acid 0.40 (H) 0.05 - 0.27 mmol/L    Comment: Performed at [facility], [address], [place], [place] [id number]  Salicylate level     Status: None   Collection Time: 04/26/19  2:00 AM  Result Value Ref Range    Salicylate Lvl [id number] 2.8 - 30.0 mg/dL    Comment: Performed at [facility], [address] [place], [place] [address]  Acetaminophen level     Status: Abnormal   Collection Time: 04/26/19  2:00 AM  Result Value Ref Range   Acetaminophen (Tylenol), Serum <10 (L) 10 - 30 ug/mL    Comment: (NOTE) Therapeutic concentrations vary significantly. A range of 10-30 ug/mL  may be an effective concentration for many patients. However, some  are best treated at concentrations outside of this range. Acetaminophen concentrations >150 ug/mL at 4 hours after ingestion  and >50 ug/mL at 12 hours after ingestion are often associated with  toxic reactions. Performed at [facility], [address] [place], [place] [address]   TSH     Status: None   Collection Time: 04/26/19  2:00 AM  Result Value Ref Range   TSH 2.620 0.350 - 4.500 uIU/mL    Comment: Performed by a 3rd Generation assay with a functional sensitivity of <=0.01 uIU/mL. Performed at [facility], [address] [place], [place] [address]   Urinalysis, Routine w reflex microscopic     Status: Abnormal   Collection Time: 04/26/19  2:16 AM  Result Value Ref Range   Color, Urine YELLOW YELLOW   APPearance CLEAR CLEAR   Specific Gravity, Urine 1.006 1.005 - 1.030   pH 5.0 5.0 - 8.0   Glucose, UA >=500 (A) NEGATIVE mg/dL   Hgb urine dipstick NEGATIVE NEGATIVE   Bilirubin Urine NEGATIVE NEGATIVE   Ketones, ur NEGATIVE NEGATIVE mg/dL   Protein, ur NEGATIVE NEGATIVE mg/dL   Nitrite NEGATIVE NEGATIVE   Leukocytes,Ua NEGATIVE NEGATIVE   RBC / HPF 0-5 0 - 5 RBC/hpf   WBC, UA 0-5 0 - 5 WBC/hpf   Bacteria, UA RARE (A) NONE SEEN   Hyaline Casts, UA PRESENT     Comment: Performed at [facility], [address] [place], [place] [address]  Rapid urine drug screen (hospital performed)     Status: None   Collection Time: 04/26/19  2:17 AM  Result Value Ref Range   Opiates NONE DETECTED NONE DETECTED   Cocaine NONE DETECTED NONE DETECTED    Benzodiazepines NONE DETECTED NONE DETECTED   Amphetamines NONE DETECTED NONE DETECTED   Tetrahydrocannabinol NONE DETECTED NONE DETECTED   Barbiturates NONE DETECTED NONE DETECTED    Comment: (NOTE) DRUG SCREEN FOR MEDICAL PURPOSES ONLY.  IF CONFIRMATION IS NEEDED FOR ANY PURPOSE, NOTIFY LAB WITHIN 5 DAYS. LOWEST DETECTABLE LIMITS FOR URINE DRUG SCREEN Drug Class                     Cutoff (ng/mL) Amphetamine and metabolites    1000 Barbiturate and metabolites    200 Benzodiazepine                 [id number] Tricyclics and metabolites  300 Opiates and metabolites        300 Cocaine and metabolites        300 THC                            50 Performed at [facility], [address], [place], [place] [address]   Blood gas, arterial     Status: Abnormal   Collection Time: 04/26/19  2:37 AM  Result Value Ref Range   FIO2 32.00    pH, Arterial 7.387 7.350 - 7.450   pCO2 arterial 50.1 (H) 32.0 - 48.0 mmHg   pO2, Arterial 89.6 83.0 - 108.0 mmHg   Bicarbonate 28.0 20.0 - 28.0 mmol/L   Acid-Base Excess 4.7 (H) 0.0 - 2.0 mmol/L   O2 Saturation 96.3 %   Patient temperature 37.0    Allens test (pass/fail) PASS PASS    Comment: Performed at [facility], [address], [place], [place] [address]  SARS Coronavirus 2 [facility] order, Performed in [facility] hospital lab) Nasopharyngeal Nasopharyngeal Swab     Status: None   Collection Time: 04/26/19  4:27 AM   Specimen: Nasopharyngeal Swab  Result Value Ref Range   SARS Coronavirus 2 NEGATIVE NEGATIVE    Comment: (NOTE) If result is NEGATIVE SARS-CoV-2 target nucleic acids are NOT DETECTED. The SARS-CoV-2 RNA is generally detectable in upper and lower  respiratory specimens during the acute phase of infection. The lowest  concentration of SARS-CoV-2 viral copies this assay can detect is 250  copies / mL. A negative result does not preclude SARS-CoV-2 infection  and should not be used as the sole basis for treatment or other  patient  management decisions.  A negative result may occur with  improper specimen collection / handling, submission of specimen other  than nasopharyngeal swab, presence of viral mutation(s) within the  areas targeted by this assay, and inadequate number of viral copies  (<250 copies / mL). A negative result must be combined with clinical  observations, patient history, and epidemiological information. If result is POSITIVE SARS-CoV-2 target nucleic acids are DETECTED. The SARS-CoV-2 RNA is generally detectable in upper and lower  respiratory specimens dur ing the acute phase of infection.  Positive  results are indicative of active infection with SARS-CoV-2.  Clinical  correlation with patient history and other diagnostic information is  necessary to determine patient infection status.  Positive results do  not rule out bacterial infection or co-infection with other viruses. If result is PRESUMPTIVE POSTIVE SARS-CoV-2 nucleic acids MAY BE PRESENT.   A presumptive positive result was obtained on the submitted specimen  and confirmed on repeat testing.  While 2019 novel coronavirus  (SARS-CoV-2) nucleic acids may be present in the submitted sample  additional confirmatory testing may be necessary for epidemiological  and / or clinical management purposes  to differentiate between  SARS-CoV-2 and other Sarbecovirus currently known to infect humans.  If clinically indicated additional testing with an alternate test  methodology [phone]) is advised. The SARS-CoV-2 RNA is generally  detectable in upper and lower respiratory sp ecimens during the acute  phase of infection. The expected result is Negative. Fact Sheet for Patients:  StrictlyIdeas.no Fact Sheet for Healthcare Providers: BankingDealers.co.za This test is not yet approved or cleared by the [place] FDA and has been authorized for detection and/or diagnosis of SARS-CoV-2 by FDA under  an Emergency Use Authorization (EUA).  This EUA will remain in effect (meaning this  test can be used) for the duration of the COVID-19 declaration under Section 564(b)(1) of the Act, 21 U.S.C. section 360bbb-3(b)(1), unless the authorization is terminated or revoked sooner. Performed at [facility], [address], [place], [place] [address]   Troponin I (High Sensitivity)     Status: None   Collection Time: 04/26/19  5:05 AM  Result Value Ref Range   Troponin I (High Sensitivity) 7 <18 ng/L    Comment: (NOTE) Elevated high sensitivity troponin I (hsTnI) values and significant  changes across serial measurements may suggest ACS but many other  chronic and acute conditions are known to elevate hsTnI results.  Refer to the "Links" section for chest pain algorithms and additional  guidance. Performed at [facility], [address] [place], [place] [address]   Vitamin B12     Status: None   Collection Time: 04/26/19  7:43 AM  Result Value Ref Range   Vitamin B-12 395 180 - 914 pg/mL    Comment: (NOTE) This assay is not validated for testing neonatal or myeloproliferative syndrome specimens for Vitamin B12 levels. Performed at [facility], [address] [place], [place] [address]   Sedimentation rate     Status: Abnormal   Collection Time: 04/26/19  7:43 AM  Result Value Ref Range   Sed Rate 45 (H) 0 - 16 mm/hr    Comment: Performed at [facility], [address] [place], [place] [address]  ANA     Status: None   Collection Time: 04/26/19  7:43 AM  Result Value Ref Range   Anti Nuclear Antibody (ANA) Negative Negative    Comment: (NOTE) Performed At: [facility] [place], [place] [id number] Rush Farmer MD [id number]   RPR     Status: None   Collection Time: 04/26/19  7:43 AM  Result Value Ref Range   RPR Ser Ql Non Reactive Non Reactive    Comment: (NOTE) Performed At: [facility] [place], [place] [id number] Rush Farmer MD [id number]   CBG monitoring, ED     Status: Abnormal   Collection Time: 04/26/19  1:54 PM  Result Value Ref Range   Glucose-Capillary 254 (H) 70 - 99 mg/dL  CBG monitoring, ED     Status: Abnormal   Collection Time: 04/26/19  5:32 PM  Result Value Ref Range   Glucose-Capillary 250 (H) 70 - 99 mg/dL  CBG monitoring, ED     Status: Abnormal   Collection Time: 04/26/19  9:07 PM  Result Value Ref Range   Glucose-Capillary 380 (H) 70 - 99 mg/dL  CBG monitoring, ED     Status: Abnormal   Collection Time: 04/26/19  9:54 PM  Result Value Ref Range   Glucose-Capillary 383 (H) 70 - 99 mg/dL  Comprehensive metabolic panel     Status: Abnormal   Collection Time: 04/27/19  4:30 AM  Result Value Ref Range   Sodium 135 135 - 145 mmol/L   Potassium 4.2 3.5 - 5.1 mmol/L   Chloride 97 (L) 98 - 111 mmol/L   CO2 29 22 - 32 mmol/L   Glucose, Bld 285 (H) 70 - 99 mg/dL   BUN 7 (L) 8 - 23 mg/dL   Creatinine, Ser 0.90 0.61 - 1.24 mg/dL   Calcium 8.8 (L) 8.9 - 10.3 mg/dL   Total Protein 6.7 6.5 - 8.1 g/dL   Albumin 3.7 3.5 - 5.0 g/dL   AST 11 (L) 15 - 41 U/L   ALT 13  0 - 44 U/L   Alkaline Phosphatase 97 38 - 126 U/L   Total Bilirubin 0.9 0.3 - 1.2 mg/dL   GFR calc non Af Amer >60 >60 mL/min   GFR calc Af Amer >60 >60 mL/min   Anion gap 9 5 - 15    Comment: Performed at [facility], [address], [place], [place] [address]  Lipid panel     Status: Abnormal   Collection Time: 04/27/19  4:30 AM  Result Value Ref Range   Cholesterol 245 (H) 0 - 200 mg/dL   Triglycerides 256 (H) <150 mg/dL   HDL 34 (L) >40 mg/dL   Total CHOL/HDL Ratio 7.2 RATIO   VLDL 51 (H) 0 - 40 mg/dL   LDL Cholesterol 160 (H) 0 - 99 mg/dL    Comment:        Total Cholesterol/HDL:CHD Risk Coronary Heart Disease Risk Table                     Men   Women  1/2 Average Risk   3.4   3.3  Average Risk       5.0   4.4  2 X Average Risk   9.6   7.1  3 X Average Risk  23.4   11.0        Use the calculated Patient  Ratio above and the CHD Risk Table to determine the patient's CHD Risk.        ATP III CLASSIFICATION (LDL):  <100     mg/dL   Optimal  100-129  mg/dL   Near or Above                    Optimal  130-159  mg/dL   Borderline  160-189  mg/dL   High  >190     mg/dL   Very High Performed at [facility] [place], [place] [address]   CBC     Status: Abnormal   Collection Time: 04/27/19  4:30 AM  Result Value Ref Range   WBC 12.2 (H) 4.0 - 10.5 K/uL   RBC 4.65 4.22 - 5.81 MIL/uL   Hemoglobin 11.5 (L) 13.0 - 17.0 g/dL   HCT 39.4 39.0 - 52.0 %   MCV 84.7 80.0 - 100.0 fL   MCH 24.7 (L) 26.0 - 34.0 pg   MCHC 29.2 (L) 30.0 - 36.0 g/dL   RDW 14.1 11.5 - 15.5 %   Platelets 281 150 - 400 K/uL   nRBC 0.0 0.0 - 0.2 %    Comment: Performed at [facility], [address] [place], [place] [address]  CBG monitoring, ED     Status: Abnormal   Collection Time: 04/27/19  7:36 AM  Result Value Ref Range   Glucose-Capillary 314 (H) 70 - 99 mg/dL  CBG monitoring, ED     Status: Abnormal   Collection Time: 04/27/19 11:58 AM  Result Value Ref Range   Glucose-Capillary 270 (H) 70 - 99 mg/dL   Medications:  Current Facility-Administered Medications  Medication Dose Route Frequency Provider Last Rate Last Dose  . 0.9 %  sodium chloride infusion  250 mL Intravenous PRN [name], MD      . acetaminophen (TYLENOL) tablet 650 mg  650 mg Oral Q6H PRN [name], MD       Or  . acetaminophen (TYLENOL) suppository 650 mg  650 mg Rectal Q6H PRN [name], MD      . albuterol (  PROVENTIL) (2.5 MG/3ML) 0.083% nebulizer solution 2.5 mg  2.5 mg Nebulization Q6H PRN [name], MD      . apixaban Arne Cleveland) tablet 5 mg  5 mg Oral BID [name], MD   5 mg at 04/27/19 1159  . docusate sodium (COLACE) capsule 100 mg  100 mg Oral BID [name], MD   100 mg at 04/27/19 1201  . DULoxetine (CYMBALTA) DR capsule 30 mg  30 mg Oral Daily [name], MD   30 mg at 04/27/19 1200  . furosemide (LASIX) tablet  40 mg  40 mg Oral QPM [name], MD      . furosemide (LASIX) tablet 60 mg  60 mg Oral Daily [name], MD   60 mg at 04/27/19 1200  . gabapentin (NEURONTIN) capsule 300 mg  300 mg Oral TID [name], MD   300 mg at 04/27/19 1200  . HYDROcodone-acetaminophen (NORCO/VICODIN) 5-325 MG per tablet 1.5 tablet  1.5 tablet Oral Q6H PRN Roxan Hockey, MD   1.5 tablet at 04/27/19 1411  . insulin aspart (novoLOG) injection 0-5 Units  0-5 Units Subcutaneous QHS [name], MD   5 Units at 04/26/19 2115  . insulin aspart (novoLOG) injection 0-9 Units  0-9 Units Subcutaneous TID WC [name], MD   5 Units at 04/27/19 1412  . metFORMIN (GLUCOPHAGE-XR) 24 hr tablet 750 mg  750 mg Oral Q breakfast [name], MD   750 mg at 04/26/19 1048  . pantoprazole (PROTONIX) EC tablet 40 mg  40 mg Oral BID [name], MD   40 mg at 04/27/19 1411  . simvastatin (ZOCOR) tablet 40 mg  40 mg Oral QPM [name], MD      . sodium chloride flush (NS) 0.9 % injection 3 mL  3 mL Intravenous Q12H [name], MD   3 mL at 04/26/19 2119  . sodium chloride flush (NS) 0.9 % injection 3 mL  3 mL Intravenous PRN [name], MD      . traZODone (DESYREL) tablet 100 mg  100 mg Oral Loma Sousa, MD   100 mg at 04/26/19 2117   Current Outpatient Medications  Medication Sig Dispense Refill  . albuterol (PROVENTIL) (2.5 MG/3ML) 0.083% nebulizer solution Take 3 mLs (2.5 mg total) by nebulization every 6 (six) hours as needed for wheezing or shortness of breath. 75 mL 0  . apixaban (ELIQUIS) 5 MG TABS tablet Take 1 tablet (5 mg total) by mouth 2 (two) times daily. 60 tablet 1  . brimonidine (ALPHAGAN) 0.15 % ophthalmic solution Place 1 drop into both eyes daily.     . cholecalciferol (VITAMIN D) 1000 UNITS tablet Take 1,000 Units by mouth 2 (two) times daily.     . Fluticasone-Umeclidin-Vilant (TRELEGY ELLIPTA) 100-62.5-25 MCG/INH AEPB Inhale 1 puff into the lungs daily. Samples given 10/01/18 by Dr. Luan Pulling    . furosemide (LASIX) 20  MG tablet take 3 tablet by mouth every day    . gabapentin (NEURONTIN) 300 MG capsule Take 1 capsule (300 mg total) by mouth 3 (three) times daily.    . hydrOXYzine (ATARAX/VISTARIL) 25 MG tablet Take 1 tablet (25 mg total) by mouth every 12 (twelve) hours as needed for itching.    . insulin lispro (HUMALOG) 100 UNIT/ML injection Inject 0-90 Units into the skin daily. Per sliding scale    . Insulin Pen Needle (PEN NEEDLES) 31G X 8 MM MISC Use to inject insulin as instructed 150 each 3  . metFORMIN (GLUCOPHAGE-XR) 750 MG  24 hr tablet Take 750 mg by mouth 2 (two) times daily.     . Multiple Vitamins-Minerals (CENTRUM SILVER 50+MEN PO) Take 1 tablet by mouth daily.    . Multiple Vitamins-Minerals (EYE VITAMINS PO) Take 2 capsules by mouth 2 (two) times daily. PerserVision    . OVER THE COUNTER MEDICATION Apply 1 application topically at bedtime. Hemp Vanna Cream Applied to neck for pain    . pantoprazole (PROTONIX) 40 MG tablet Take 40 mg by mouth 2 (two) times daily.    . simvastatin (ZOCOR) 40 MG tablet Take 40 mg by mouth every evening.    . traZODone (DESYREL) 100 MG tablet Take 100 mg by mouth at bedtime.     . Vitamins A & D (VITAMIN A & D) ointment Apply topically.    . citalopram (CELEXA) 40 MG tablet Take 40 mg by mouth daily.    . clopidogrel (PLAVIX) 75 MG tablet Take by mouth.    . Continuous Blood Gluc Receiver (FREESTYLE LIBRE READER) DEVI as directed    . diclofenac sodium (VOLTAREN) 1 % GEL Apply 1 application topically 3 (three) times daily. For neck pain    . docusate sodium (COLACE) 100 MG capsule 1 capsule as needed    . DULoxetine (CYMBALTA) 30 MG capsule 1 capsule    . fentaNYL (DURAGESIC) 50 MCG/HR Place onto the skin.    Marland Kitchen HYDROcodone-acetaminophen (NORCO/VICODIN) 5-325 MG tablet Take by mouth.    . insulin aspart (NOVOLOG FLEXPEN) 100 UNIT/ML FlexPen Inject 15-25 Units into the skin 3 (three) times daily with meals. <149 =15 units, 150-180 = 17 units, 181-210= 19 units,  211-240= 21 units, 241-270= 23 units, >271= 25 units 15 mL 3  . Insulin Glargine (LANTUS SOLOSTAR) 100 UNIT/ML Solostar Pen Inject 35 Units into the skin 2 (two) times daily. Per endocrine visit on 07/28/18 15 mL 3  . LORazepam (ATIVAN) 0.5 MG tablet Take by mouth.    . metoprolol succinate (TOPROL-XL) 50 MG 24 hr tablet Take by mouth.    . NON FORMULARY Take 1 capsule by mouth 2 (two) times daily. Omega XL OTC    . nystatin-triamcinolone ointment (MYCOLOG) Apply 1 application topically 2 (two) times daily. Apply to both feet twice daily until scales are gone 30 g 2  . oxyCODONE (OXY IR/ROXICODONE) 5 MG immediate release tablet Take by mouth.    . polyethylene glycol (MIRALAX / GLYCOLAX) 17 g packet Take by mouth.    . senna (SENNA-TIME) 8.6 MG tablet Take by mouth.    . terazosin (HYTRIN) 1 MG capsule Take by mouth.    . tiotropium (SPIRIVA) 18 MCG inhalation capsule Place into inhaler and inhale.    . triamcinolone cream (KENALOG) 0.1 % Apply topically.    . ziprasidone (GEODON) 80 MG capsule Take by mouth.     Musculoskeletal: Strength & Muscle Tone: within normal limits Gait & Station: normal Patient leans: N/A  Psychiatric Specialty Exam: Physical Exam  Nursing note and vitals reviewed. Constitutional: He is oriented to person, place, and time. He appears well-developed.  Obese  HENT:  Head: Normocephalic.  Eyes: Pupils are equal, round, and reactive to light.  Neck: Normal range of motion.  Cardiovascular:  Hx. HTN: 141/106, pulse 105  Respiratory: Effort normal.  GI: Soft.  Genitourinary:    Genitourinary Comments: Deferred   Musculoskeletal: Normal range of motion.  Neurological: He is alert and oriented to person, place, and time.  Skin: Skin is warm.    Review of  Systems  Constitutional: Negative for chills and fever.  HENT: Negative.   Eyes: Negative.   Respiratory: Negative for cough, shortness of breath and wheezing.   Cardiovascular:       Hx. HTN   Gastrointestinal: Negative for heartburn, nausea and vomiting.  Genitourinary: Negative.   Musculoskeletal: Negative.   Skin: Negative.   Neurological: Negative for dizziness and headaches.  Endo/Heme/Allergies: Negative.   Psychiatric/Behavioral: Negative for depression, hallucinations, memory loss, substance abuse and suicidal ideas. The patient is nervous/anxious ("I'm anxious to get out of the hospital'). The patient does not have insomnia.     Blood pressure (!) 141/106, pulse (!) 105, temperature 98 F (36.7 C), temperature source Oral, resp. rate 11, SpO2 94 %.There is no height or weight on file to calculate BMI.  General Appearance: In a hospital gown, obese  Eye Contact:  Good  Speech:  Clear and Coherent  Volume:  Normal  Mood:  Euthymic  Affect:  Appropriate and Congruent  Thought Process:  Coherent and Descriptions of Associations: Intact  Orientation:  Full (Time, Place, and Person)  Thought Content:  Logical  Suicidal Thoughts:  Denies any thoughts, plans or intent  Homicidal Thoughts:  Denies  Memory:  Immediate;   Good Recent;   Good Remote;   Good  Judgement:  Good  Insight:  Present  Psychomotor Activity:  Decreased  Concentration:  Concentration: Good and Attention Span: Good  Recall:  Good  Fund of Knowledge:  Fair  Language:  Good  Akathisia:  No  Handed:  Right  AIMS (if indicated):     Assets:  Communication Skills Desire for Improvement Social Support  ADL's:  Intact  Cognition:  WNL  Sleep:      Treatment Plan Summary: Plan Discharge patient to continue mental health care on an outpatient basis.  Disposition: No evidence of imminent risk to self or others at present.   Patient does not meet criteria for psychiatric inpatient admission. Discussed crisis plan, support from social network, calling 911, coming to the Emergency Department, and calling Suicide Hotline.  Patient is instructed & encouraged to continue follow-up care for all his  medical problems with his outpatient primary care provider.  This service was provided via telemedicine using a 2-way, interactive audio and video technology.  Names of all persons participating in this telemedicine service and their role in this encounter. Name: Encarnacion Slates Role: PMHNP, FNP-BC  Name: Dr. Dwyane Dee. Role:  Name:  Role:   Name: Role:   Lindell Spar, NP, PMHNP, FNP-BC 04/27/2019 3:13 PM

## 2019-04-27 NOTE — BH Assessment (Signed)
Collateral information--wife reports that she is having issues with her phone, but she has been wanting to talk with someone about pt's issues. She states that he has talked with her and wants to come home, and she states that she understands that he has cleared up mentally. She talked about how she is depressed herself and is having a hard time managing pt at home, but she understands that pt may not meet criteria for Geropsych at this time, and he does not want to sign in to a facility voluntarily. Pt does not meet IVC criteria. Wife says that she may try to take pt to their condominium in [place] where he might be easier to manage and she has more support. They have a son nearby in [place], but he does not offer much support. He can help on occasion. Pt's wife is concerned that pt might die at home like their son did, and she states that she cannot cope with that. Writer asked about the possibility of getting some home health support, and she said that one of pt's MD s said that maybe short-term rehab might be helpful.  Shuvon Rankin, NP will see pt to determine disposition.

## 2019-04-27 NOTE — Care Management Obs Status (Signed)
[facility] NOTIFICATION   Patient Details  Name: Rick Cruz MRN: [id number] Date of Birth: 07-14-1946   Medicare Observation Status Notification Given:  Yes    Tommy Medal 04/27/2019, 1:18 PM

## 2019-04-27 NOTE — ED Notes (Signed)
Dr. Wilson Singer in and spoke with pt and family member

## 2019-04-27 NOTE — ED Notes (Signed)
[facility]  [phone]

## 2019-04-27 NOTE — ED Notes (Signed)
Pt unable to wear purple scrubs, charge nurse notified.  Pt placed in in own

## 2019-05-05 DIAGNOSIS — E118 Type 2 diabetes mellitus with unspecified complications: Secondary | ICD-10-CM | POA: Diagnosis not present

## 2019-05-05 DIAGNOSIS — I4891 Unspecified atrial fibrillation: Secondary | ICD-10-CM | POA: Diagnosis not present

## 2019-05-05 DIAGNOSIS — E1165 Type 2 diabetes mellitus with hyperglycemia: Secondary | ICD-10-CM | POA: Diagnosis not present

## 2019-05-05 DIAGNOSIS — Z794 Long term (current) use of insulin: Secondary | ICD-10-CM | POA: Diagnosis not present

## 2019-05-19 DIAGNOSIS — I4891 Unspecified atrial fibrillation: Secondary | ICD-10-CM | POA: Diagnosis not present

## 2019-05-19 DIAGNOSIS — J449 Chronic obstructive pulmonary disease, unspecified: Secondary | ICD-10-CM | POA: Diagnosis not present

## 2019-05-19 DIAGNOSIS — E1165 Type 2 diabetes mellitus with hyperglycemia: Secondary | ICD-10-CM | POA: Diagnosis not present

## 2019-05-19 DIAGNOSIS — E118 Type 2 diabetes mellitus with unspecified complications: Secondary | ICD-10-CM | POA: Diagnosis not present

## 2019-05-19 DIAGNOSIS — Z794 Long term (current) use of insulin: Secondary | ICD-10-CM | POA: Diagnosis not present

## 2019-06-23 ENCOUNTER — Ambulatory Visit: Payer: Medicare Other | Admitting: Podiatry

## 2019-06-30 DIAGNOSIS — Z23 Encounter for immunization: Secondary | ICD-10-CM | POA: Diagnosis not present

## 2019-07-15 DIAGNOSIS — E11649 Type 2 diabetes mellitus with hypoglycemia without coma: Secondary | ICD-10-CM | POA: Diagnosis not present

## 2019-07-15 DIAGNOSIS — Z794 Long term (current) use of insulin: Secondary | ICD-10-CM | POA: Diagnosis not present

## 2019-07-15 DIAGNOSIS — G9341 Metabolic encephalopathy: Secondary | ICD-10-CM | POA: Diagnosis not present

## 2019-07-15 DIAGNOSIS — I110 Hypertensive heart disease with heart failure: Secondary | ICD-10-CM | POA: Diagnosis not present

## 2019-07-15 DIAGNOSIS — I5042 Chronic combined systolic (congestive) and diastolic (congestive) heart failure: Secondary | ICD-10-CM | POA: Diagnosis not present

## 2019-07-15 DIAGNOSIS — G9340 Encephalopathy, unspecified: Secondary | ICD-10-CM | POA: Diagnosis not present

## 2019-07-15 DIAGNOSIS — I6781 Acute cerebrovascular insufficiency: Secondary | ICD-10-CM | POA: Diagnosis not present

## 2019-07-15 DIAGNOSIS — I480 Paroxysmal atrial fibrillation: Secondary | ICD-10-CM | POA: Diagnosis not present

## 2019-07-15 DIAGNOSIS — R531 Weakness: Secondary | ICD-10-CM | POA: Diagnosis not present

## 2019-07-15 DIAGNOSIS — H5461 Unqualified visual loss, right eye, normal vision left eye: Secondary | ICD-10-CM | POA: Diagnosis not present

## 2019-07-15 DIAGNOSIS — I2510 Atherosclerotic heart disease of native coronary artery without angina pectoris: Secondary | ICD-10-CM | POA: Diagnosis not present

## 2019-07-15 DIAGNOSIS — J449 Chronic obstructive pulmonary disease, unspecified: Secondary | ICD-10-CM | POA: Diagnosis not present

## 2019-07-15 DIAGNOSIS — R4182 Altered mental status, unspecified: Secondary | ICD-10-CM | POA: Diagnosis not present

## 2019-07-16 DIAGNOSIS — R410 Disorientation, unspecified: Secondary | ICD-10-CM | POA: Diagnosis not present

## 2019-07-16 DIAGNOSIS — G9340 Encephalopathy, unspecified: Secondary | ICD-10-CM | POA: Diagnosis not present

## 2019-07-17 DIAGNOSIS — G9340 Encephalopathy, unspecified: Secondary | ICD-10-CM | POA: Diagnosis not present

## 2019-08-04 DIAGNOSIS — Z794 Long term (current) use of insulin: Secondary | ICD-10-CM | POA: Diagnosis not present

## 2019-08-04 DIAGNOSIS — R519 Headache, unspecified: Secondary | ICD-10-CM | POA: Diagnosis not present

## 2019-08-04 DIAGNOSIS — Z20828 Contact with and (suspected) exposure to other viral communicable diseases: Secondary | ICD-10-CM | POA: Diagnosis not present

## 2019-08-04 DIAGNOSIS — Z9981 Dependence on supplemental oxygen: Secondary | ICD-10-CM | POA: Diagnosis not present

## 2019-08-04 DIAGNOSIS — I509 Heart failure, unspecified: Secondary | ICD-10-CM | POA: Diagnosis not present

## 2019-08-04 DIAGNOSIS — J189 Pneumonia, unspecified organism: Secondary | ICD-10-CM | POA: Diagnosis not present

## 2019-08-04 DIAGNOSIS — R350 Frequency of micturition: Secondary | ICD-10-CM | POA: Diagnosis not present

## 2019-08-04 DIAGNOSIS — E1165 Type 2 diabetes mellitus with hyperglycemia: Secondary | ICD-10-CM | POA: Diagnosis not present

## 2019-08-04 DIAGNOSIS — J449 Chronic obstructive pulmonary disease, unspecified: Secondary | ICD-10-CM | POA: Diagnosis not present

## 2019-08-04 DIAGNOSIS — I517 Cardiomegaly: Secondary | ICD-10-CM | POA: Diagnosis not present

## 2019-08-04 DIAGNOSIS — J168 Pneumonia due to other specified infectious organisms: Secondary | ICD-10-CM | POA: Diagnosis not present

## 2019-08-04 DIAGNOSIS — R7309 Other abnormal glucose: Secondary | ICD-10-CM | POA: Diagnosis not present

## 2019-08-04 DIAGNOSIS — R0602 Shortness of breath: Secondary | ICD-10-CM | POA: Diagnosis not present

## 2019-08-04 DIAGNOSIS — I480 Paroxysmal atrial fibrillation: Secondary | ICD-10-CM | POA: Diagnosis not present

## 2019-08-28 DIAGNOSIS — S99821A Other specified injuries of right foot, initial encounter: Secondary | ICD-10-CM | POA: Diagnosis not present

## 2019-08-28 DIAGNOSIS — Z6832 Body mass index (BMI) 32.0-32.9, adult: Secondary | ICD-10-CM | POA: Diagnosis not present

## 2019-08-28 DIAGNOSIS — L449 Papulosquamous disorder, unspecified: Secondary | ICD-10-CM | POA: Diagnosis not present

## 2019-08-28 DIAGNOSIS — E669 Obesity, unspecified: Secondary | ICD-10-CM | POA: Diagnosis not present

## 2019-09-14 ENCOUNTER — Ambulatory Visit: Payer: Medicare Other | Admitting: Podiatry

## 2019-09-23 ENCOUNTER — Ambulatory Visit: Payer: Medicare PPO | Admitting: Podiatry

## 2019-11-03 DIAGNOSIS — G9341 Metabolic encephalopathy: Secondary | ICD-10-CM | POA: Diagnosis not present

## 2019-11-03 DIAGNOSIS — E1110 Type 2 diabetes mellitus with ketoacidosis without coma: Secondary | ICD-10-CM | POA: Diagnosis not present

## 2019-11-03 DIAGNOSIS — J9611 Chronic respiratory failure with hypoxia: Secondary | ICD-10-CM | POA: Diagnosis not present

## 2019-11-03 DIAGNOSIS — I5042 Chronic combined systolic (congestive) and diastolic (congestive) heart failure: Secondary | ICD-10-CM | POA: Diagnosis not present

## 2019-11-03 DIAGNOSIS — R2689 Other abnormalities of gait and mobility: Secondary | ICD-10-CM | POA: Diagnosis not present

## 2019-11-03 DIAGNOSIS — J449 Chronic obstructive pulmonary disease, unspecified: Secondary | ICD-10-CM | POA: Diagnosis not present

## 2019-11-03 DIAGNOSIS — I10 Essential (primary) hypertension: Secondary | ICD-10-CM | POA: Diagnosis not present

## 2019-11-03 DIAGNOSIS — R4182 Altered mental status, unspecified: Secondary | ICD-10-CM | POA: Diagnosis not present

## 2019-11-03 DIAGNOSIS — E1165 Type 2 diabetes mellitus with hyperglycemia: Secondary | ICD-10-CM | POA: Diagnosis not present

## 2019-11-03 DIAGNOSIS — I4891 Unspecified atrial fibrillation: Secondary | ICD-10-CM | POA: Diagnosis not present

## 2019-11-03 DIAGNOSIS — E1100 Type 2 diabetes mellitus with hyperosmolarity without nonketotic hyperglycemic-hyperosmolar coma (NKHHC): Secondary | ICD-10-CM | POA: Diagnosis not present

## 2019-11-03 DIAGNOSIS — J9602 Acute respiratory failure with hypercapnia: Secondary | ICD-10-CM | POA: Diagnosis not present

## 2019-11-03 DIAGNOSIS — D72829 Elevated white blood cell count, unspecified: Secondary | ICD-10-CM | POA: Diagnosis not present

## 2019-11-03 DIAGNOSIS — Z794 Long term (current) use of insulin: Secondary | ICD-10-CM | POA: Diagnosis not present

## 2019-11-03 DIAGNOSIS — D638 Anemia in other chronic diseases classified elsewhere: Secondary | ICD-10-CM | POA: Diagnosis not present

## 2019-11-03 DIAGNOSIS — J9612 Chronic respiratory failure with hypercapnia: Secondary | ICD-10-CM | POA: Diagnosis not present

## 2019-11-03 DIAGNOSIS — N179 Acute kidney failure, unspecified: Secondary | ICD-10-CM | POA: Diagnosis not present

## 2019-11-03 DIAGNOSIS — J811 Chronic pulmonary edema: Secondary | ICD-10-CM | POA: Diagnosis not present

## 2019-11-03 DIAGNOSIS — E1149 Type 2 diabetes mellitus with other diabetic neurological complication: Secondary | ICD-10-CM | POA: Diagnosis not present

## 2019-11-03 DIAGNOSIS — I480 Paroxysmal atrial fibrillation: Secondary | ICD-10-CM | POA: Diagnosis not present

## 2019-11-03 DIAGNOSIS — I517 Cardiomegaly: Secondary | ICD-10-CM | POA: Diagnosis not present

## 2019-11-23 DIAGNOSIS — K219 Gastro-esophageal reflux disease without esophagitis: Secondary | ICD-10-CM | POA: Diagnosis not present

## 2019-11-23 DIAGNOSIS — J449 Chronic obstructive pulmonary disease, unspecified: Secondary | ICD-10-CM | POA: Diagnosis not present

## 2019-11-23 DIAGNOSIS — I259 Chronic ischemic heart disease, unspecified: Secondary | ICD-10-CM | POA: Diagnosis not present

## 2019-11-23 DIAGNOSIS — E1140 Type 2 diabetes mellitus with diabetic neuropathy, unspecified: Secondary | ICD-10-CM | POA: Diagnosis not present

## 2019-11-23 DIAGNOSIS — F329 Major depressive disorder, single episode, unspecified: Secondary | ICD-10-CM | POA: Diagnosis not present

## 2019-11-23 DIAGNOSIS — I4891 Unspecified atrial fibrillation: Secondary | ICD-10-CM | POA: Diagnosis not present

## 2019-11-23 DIAGNOSIS — I10 Essential (primary) hypertension: Secondary | ICD-10-CM | POA: Diagnosis not present

## 2019-11-23 DIAGNOSIS — E7800 Pure hypercholesterolemia, unspecified: Secondary | ICD-10-CM | POA: Diagnosis not present

## 2019-11-23 DIAGNOSIS — E1165 Type 2 diabetes mellitus with hyperglycemia: Secondary | ICD-10-CM | POA: Diagnosis not present

## 2019-11-25 DIAGNOSIS — I10 Essential (primary) hypertension: Secondary | ICD-10-CM | POA: Diagnosis not present

## 2019-11-25 DIAGNOSIS — E1165 Type 2 diabetes mellitus with hyperglycemia: Secondary | ICD-10-CM | POA: Diagnosis not present

## 2019-11-25 DIAGNOSIS — J449 Chronic obstructive pulmonary disease, unspecified: Secondary | ICD-10-CM | POA: Diagnosis not present

## 2019-11-25 DIAGNOSIS — Z794 Long term (current) use of insulin: Secondary | ICD-10-CM | POA: Diagnosis not present

## 2019-11-25 DIAGNOSIS — E7800 Pure hypercholesterolemia, unspecified: Secondary | ICD-10-CM | POA: Diagnosis not present

## 2019-11-25 DIAGNOSIS — E1140 Type 2 diabetes mellitus with diabetic neuropathy, unspecified: Secondary | ICD-10-CM | POA: Diagnosis not present

## 2019-11-25 DIAGNOSIS — I259 Chronic ischemic heart disease, unspecified: Secondary | ICD-10-CM | POA: Diagnosis not present

## 2019-11-25 DIAGNOSIS — I4891 Unspecified atrial fibrillation: Secondary | ICD-10-CM | POA: Diagnosis not present

## 2019-11-25 DIAGNOSIS — E118 Type 2 diabetes mellitus with unspecified complications: Secondary | ICD-10-CM | POA: Diagnosis not present

## 2019-11-26 DIAGNOSIS — E1149 Type 2 diabetes mellitus with other diabetic neurological complication: Secondary | ICD-10-CM | POA: Diagnosis not present

## 2019-11-26 DIAGNOSIS — E1140 Type 2 diabetes mellitus with diabetic neuropathy, unspecified: Secondary | ICD-10-CM | POA: Diagnosis not present

## 2019-11-26 DIAGNOSIS — G9341 Metabolic encephalopathy: Secondary | ICD-10-CM | POA: Diagnosis not present

## 2019-12-15 DIAGNOSIS — Z89422 Acquired absence of other left toe(s): Secondary | ICD-10-CM | POA: Diagnosis not present

## 2019-12-15 DIAGNOSIS — G600 Hereditary motor and sensory neuropathy: Secondary | ICD-10-CM | POA: Diagnosis not present

## 2019-12-15 DIAGNOSIS — E139 Other specified diabetes mellitus without complications: Secondary | ICD-10-CM | POA: Diagnosis not present

## 2019-12-15 DIAGNOSIS — M79674 Pain in right toe(s): Secondary | ICD-10-CM | POA: Diagnosis not present

## 2019-12-15 DIAGNOSIS — B351 Tinea unguium: Secondary | ICD-10-CM | POA: Diagnosis not present

## 2019-12-15 DIAGNOSIS — M79675 Pain in left toe(s): Secondary | ICD-10-CM | POA: Diagnosis not present

## 2020-02-11 ENCOUNTER — Other Ambulatory Visit: Payer: Self-pay

## 2020-02-11 ENCOUNTER — Encounter: Payer: Self-pay | Admitting: Emergency Medicine

## 2020-02-11 ENCOUNTER — Ambulatory Visit
Admission: EM | Admit: 2020-02-11 | Discharge: 2020-02-11 | Disposition: A | Discharge status: Home / Self Care | Payer: Medicare PPO

## 2020-02-11 DIAGNOSIS — L821 Other seborrheic keratosis: Secondary | ICD-10-CM | POA: Diagnosis not present

## 2020-02-11 MED ORDER — PREDNISONE 10 MG PO TABS: 20.0000 mg | ORAL_TABLET | Freq: Every day | ORAL | 0 refills | Status: DC

## 2020-02-11 MED ORDER — TRIAMCINOLONE ACETONIDE 0.1 % EX CREA: 1.0000 "application " | TOPICAL_CREAM | Freq: Two times a day (BID) | CUTANEOUS | 0 refills | Status: AC

## 2020-02-11 NOTE — ED Triage Notes (Signed)
Patches of red raised bumps on back x 2 years.  The rash burns and itches.  Told by a Dr at the hospital to see a dermatologist, pt has an appointment in October.

## 2020-02-11 NOTE — Discharge Instructions (Addendum)
Triamcinolone topical was prescribed Low-dose prednisone were prescribed for itching Follow-up with PCP or dermatology Return or go to ED for worsening of symptoms

## 2020-02-11 NOTE — ED Provider Notes (Signed)
[facility]    CSN: [id number] Arrival date & time: 02/11/20  1609      History   Chief Complaint Chief Complaint  Patient presents with  . Rash    HPI Rick Cruz is a 74 y.o. male.   Who presented to the urgent care for complaint of rash for the past 3 years.he denies changes in soaps, detergents, or anyone with similar symptoms.  He localizes the rash to his back.  Reports itching at all times.  Described the rash as red, itchy painful and burning.  Has tried OTC medication without relief.  Symptoms are made worse at nighttime.denies similar symptoms in the past.  Denies chills, fever, nausea, vomiting, diarrhea.  The history is provided by the patient. No language interpreter was used.  Rash   Past Medical History:  Diagnosis Date  . Arthritis   . 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 CHF (congestive heart failure) (HCC)    diastolic   . Chronic pain   . COPD (chronic obstructive pulmonary disease) ([place])   . Depression   . Diabetes mellitus without complication (HCC)    insulin pump  . Hypercholesterolemia   . Hypertension   . On home O2    3 liters  . Oxygen deficiency   . Schizophrenia ([place])   . Sleep apnea    noncompliant with BiPAP  . Sleep apnea   . Tobacco abuse     Patient Active Problem List   Diagnosis Date Noted  . Heart failure ([place]) 02/02/2019  . Hyperglycemia   . Altered mental status 11/21/2018  . Diabetic foot ([place]) 07/23/2018  . Accidental fall 06/25/2018  . Supplemental oxygen dependent 06/25/2018  . 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 Neck pain 01/09/2018  . 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 Diabetic neuropathy ([place]) 12/13/2017  . Gastroesophageal reflux disease 12/13/2017  . Insomnia 07/30/2017  . Depression 07/19/2016  . OSA (obstructive sleep apnea)   . Chronic respiratory failure with hypercapnia ([place])   . Undifferentiated schizophrenia ([place])   . Uncontrolled type 2 diabetes mellitus with complication  ([place])   . Anemia of chronic disease   . Paroxysmal atrial fibrillation (HCC)   . Hyponatremia 06/12/2016  . Aspiration pneumonia ([place]) 08/16/2015  . Seborrheic keratosis 08/04/2015  . Pure hypercholesterolemia 07/05/2015  . Obesity hypoventilation syndrome ([place]) 04/05/2015  . CSA (central sleep apnea) 04/05/2015  . Non compliance with medical treatment 04/05/2015  . Type 2 diabetes mellitus with hyperglycemia ([place]) 02/28/2015  . Acute respiratory failure with hypoxia and hypercarbia (HCC)   . Chronic diastolic CHF (congestive heart failure) ([place]) 08/06/2014  . Recurrent falls 05/08/2014  . Ankle fracture, right 05/08/2014  . Ankle fracture, left 05/08/2014  . C. difficile colitis 01/04/2014  . Delirium 01/02/2014  . Acute encephalopathy 01/02/2014  . Morbid obesity ([place]) 06/17/2013  . Erosive esophagitis 04/22/2013  . Chronic respiratory failure 02 dep with hypercarbia 04/22/2013  . Type 2 diabetes mellitus without complication, with long-term current use of insulin ([place]) 04/21/2013  . Paranoid schizophrenia ([place]) 04/18/2013  . CAD (coronary artery disease) 04/18/2013  . DYSLIPIDEMIA 08/25/2008  . Obstructive sleep apnea 08/25/2008  . Essential hypertension 08/25/2008  . Dyslipidemia 08/25/2008    Past Surgical History:  Procedure Laterality Date  . COLON SURGERY  March 2010   secondary to large colon polyp, final path per discharge summary notes tubulovillous adenoma.   . COLONOSCOPY  July 2011   Dr. Benson Norway: multiple  polyps, internal and external hemorrhoids, diverticulosis. tubular adenoma  . ESOPHAGOGASTRODUODENOSCOPY (EGD) WITH PROPOFOL N/A 04/20/2013   Procedure: ESOPHAGOGASTRODUODENOSCOPY (EGD) WITH PROPOFOL;  Surgeon: Daneil Dolin, MD;  Location: [facility];  Service: Endoscopy;  Laterality: N/A;  . KNEE SURGERY     X2       Home Medications    Prior to Admission medications   Medication Sig Start Date End Date Taking? Authorizing Provider  albuterol (PROVENTIL)  (2.5 MG/3ML) 0.083% nebulizer solution Take 3 mLs (2.5 mg total) by nebulization every 6 (six) hours as needed for wheezing or shortness of breath. 08/21/15   Kathie Dike, MD  apixaban (ELIQUIS) 5 MG TABS tablet Take 1 tablet (5 mg total) by mouth 2 (two) times daily. 06/15/16   Rai, Ripudeep K, MD  brimonidine (ALPHAGAN) 0.15 % ophthalmic solution Place 1 drop into both eyes daily.     [provider]  cholecalciferol (VITAMIN D) 1000 UNITS tablet Take 1,000 Units by mouth 2 (two) times daily.     [provider]  Cinnamon 500 MG capsule Take by mouth.    [provider]  citalopram (CELEXA) 40 MG tablet Take 40 mg by mouth daily.    [provider]  clopidogrel (PLAVIX) 75 MG tablet Take by mouth.    [provider]  Continuous Blood Gluc Receiver (FREESTYLE LIBRE READER) DEVI as directed 11/19/18   [provider]  diclofenac sodium (VOLTAREN) 1 % GEL Apply 1 application topically 3 (three) times daily. For neck pain    Janie Morning, DO  docusate sodium (COLACE) 100 MG capsule 1 capsule as needed 05/17/18   [provider]  DULoxetine (CYMBALTA) 30 MG capsule 1 capsule 02/02/19   [provider]  fentaNYL ([place]) 50 MCG/HR Place onto the skin.    [provider]  Fluticasone-Umeclidin-Vilant (TRELEGY ELLIPTA) 100-62.5-25 MCG/INH AEPB Inhale 1 puff into the lungs daily. Samples given 10/01/18 by Dr. Luan Pulling    [provider]  furosemide (LASIX) 20 MG tablet take 3 tablet by mouth every day    [provider]  gabapentin (NEURONTIN) 300 MG capsule Take 1 capsule (300 mg total) by mouth 3 (three) times daily. 11/22/18   Barton Dubois, MD  HYDROcodone-acetaminophen (NORCO/VICODIN) 5-325 MG tablet Take by mouth.    [provider]  hydrOXYzine (ATARAX/VISTARIL) 25 MG tablet Take 1 tablet (25 mg total) by mouth every 12 (twelve) hours as needed for itching. 11/22/18   Barton Dubois, MD  insulin  aspart (NOVOLOG FLEXPEN) 100 UNIT/ML FlexPen Inject 15-25 Units into the skin 3 (three) times daily with meals. <149 =15 units, 150-180 = 17 units, 181-210= 19 units, 211-240= 21 units, 241-270= 23 units, >271= 25 units 04/27/19   Virgel Manifold, MD  Insulin Glargine (LANTUS SOLOSTAR) 100 UNIT/ML Solostar Pen Inject 35 Units into the skin 2 (two) times daily. Per endocrine visit on 07/28/18 04/27/19   Virgel Manifold, MD  insulin lispro (HUMALOG) 100 UNIT/ML injection Inject 0-90 Units into the skin daily. Per sliding scale 02/24/19   [provider]  Insulin Pen Needle (PEN NEEDLES) 31G X 8 MM MISC Use to inject insulin as instructed 11/22/18   Barton Dubois, MD  LORazepam (ATIVAN) 0.5 MG tablet Take by mouth.    [provider]  metFORMIN (GLUCOPHAGE-XR) 750 MG 24 hr tablet Take 750 mg by mouth 2 (two) times daily.     [provider]  metoprolol succinate (TOPROL-XL) 50 MG 24 hr tablet Take by mouth.  [provider]  Multiple Vitamins-Minerals (CENTRUM SILVER 50+MEN PO) Take 1 tablet by mouth daily.    [provider]  Multiple Vitamins-Minerals (EYE VITAMINS PO) Take 2 capsules by mouth 2 (two) times daily. PerserVision    [provider]  NON FORMULARY Take 1 capsule by mouth 2 (two) times daily. Omega XL OTC    [provider]  nystatin-triamcinolone ointment (MYCOLOG) Apply 1 application topically 2 (two) times daily. Apply to both feet twice daily until scales are gone 07/30/18   Marzetta Board, DPM  OVER THE COUNTER MEDICATION Apply 1 application topically at bedtime. Hemp Vanna Cream Applied to neck for pain    [provider]  oxyCODONE (OXY IR/ROXICODONE) 5 MG immediate release tablet Take by mouth. 05/17/18   [provider]  pantoprazole (PROTONIX) 40 MG tablet Take 40 mg by mouth 2 (two) times daily.    [provider]  polyethylene glycol (MIRALAX / GLYCOLAX) 17 g packet Take by mouth. 05/17/18    [provider]  predniSONE (DELTASONE) 10 MG tablet Take 2 tablets (20 mg total) by mouth daily. 02/11/20   Konrad Hoak, Darrelyn Hillock, FNP  senna (SENNA-TIME) 8.6 MG tablet Take by mouth. 05/17/18   [provider]  simvastatin (ZOCOR) 40 MG tablet Take 40 mg by mouth every evening.    [provider]  terazosin (HYTRIN) 1 MG capsule Take by mouth.    [provider]  tiotropium (SPIRIVA) 18 MCG inhalation capsule Place into inhaler and inhale.    [provider]  traZODone (DESYREL) 100 MG tablet Take 100 mg by mouth at bedtime.     [provider]  triamcinolone cream (KENALOG) 0.1 % Apply 1 application topically 2 (two) times daily. 02/11/20   Damya Comley, Darrelyn Hillock, FNP  Vitamins A & D (VITAMIN A & D) ointment Apply topically.    [provider]  ziprasidone (GEODON) 80 MG capsule Take by mouth.    [provider]    Family History Family History  Problem Relation Age of Onset  . Thyroid disease Mother   . Parkinson's disease Father   . Parkinson's disease Other   . Colon cancer Neg Hx     Social History Social History   Tobacco Use  . Smoking status: Former Smoker    Packs/day: 1.00    Years: 50.00    Pack years: 50.00    Types: Cigarettes    Quit date: 09/16/2013    Years since quitting: 6.4  . Smokeless tobacco: Never Used  Substance Use Topics  . Alcohol use: No    Comment: history of ETOH abuse in remote past, none in at least 10 years  . Drug use: No     Allergies   Phenergan [promethazine hcl], Haldol [haloperidol lactate], and Metformin and related   Review of Systems Review of Systems  Constitutional: Negative.   Respiratory: Negative.   Cardiovascular: Negative.   Skin: Positive for color change and rash.  All other systems reviewed and are negative.    Physical Exam Triage Vital Signs ED Triage Vitals  Enc Vitals Group     BP 02/11/20 1632 (!) 170/73     Pulse Rate 02/11/20 1632 (!)  119     Resp 02/11/20 1632 20     Temp 02/11/20 1632 98.4 F (36.9 C)     Temp Source 02/11/20 1632 Oral     SpO2 02/11/20 1632 90 %     Weight 02/11/20 1633 170 lb (  77.1 kg)     Height 02/11/20 1633 6' (1.829 m)     Head Circumference --      Peak Flow --      Pain Score 02/11/20 1633 0     Pain Loc --      Pain Edu? --      Excl. in [place]? --    No data found.  Updated Vital Signs BP (!) 170/73 (BP Location: Right Arm)   Pulse (!) 119   Temp 98.4 F (36.9 C) (Oral)   Resp 20   Ht 6' (1.829 m)   Wt 170 lb (77.1 kg)   SpO2 90%   BMI 23.06 kg/m   Visual Acuity Right Eye Distance:   Left Eye Distance:   Bilateral Distance:    Right Eye Near:   Left Eye Near:    Bilateral Near:     Physical Exam Vitals and nursing note reviewed.  Constitutional:      General: He is not in acute distress.    Appearance: Normal appearance. He is normal weight. He is not ill-appearing, toxic-appearing or diaphoretic.  Cardiovascular:     Rate and Rhythm: Normal rate and regular rhythm.     Pulses: Normal pulses.     Heart sounds: Normal heart sounds. No murmur. No friction rub. No gallop.   Pulmonary:     Effort: Pulmonary effort is normal. No respiratory distress.     Breath sounds: Normal breath sounds. No stridor. No wheezing, rhonchi or rales.  Chest:     Chest wall: No tenderness.  Skin:    General: Skin is warm.     Findings: Rash present. Rash is macular and scaling.     Comments: Red macular rash in tree like pattern  Neurological:     Mental Status: He is alert.      UC Treatments / Results  Labs (all labs ordered are listed, but only abnormal results are displayed) Labs Reviewed - No data to display  EKG   Radiology No results found.  Procedures Procedures (including critical care time)  Medications Ordered in UC Medications - No data to display  Initial Impression / Assessment and Plan / UC Course  I have reviewed the triage vital signs and the nursing  notes.  Pertinent labs & imaging results that were available during my care of the patient were reviewed by me and considered in my medical decision making (see chart for details).   Patient is stable at discharge.  Symptom is likely from seborrheic keratosis.  Triamcinolone cream and prednisone were prescribed to to improve itching.  Was advised to follow-up with dermatology.  Final Clinical Impressions(s) / UC Diagnoses   Final diagnoses:  Seborrheic keratosis     Discharge Instructions     Triamcinolone topical was prescribed Low-dose prednisone were prescribed for itching Follow-up with PCP or dermatology Return or go to ED for worsening of symptoms    ED Prescriptions    Medication Sig Dispense Auth. Provider   triamcinolone cream (KENALOG) 0.1 % Apply 1 application topically 2 (two) times daily. 30 g Illyria Sobocinski, Darrelyn Hillock, FNP   predniSONE (DELTASONE) 10 MG tablet Take 2 tablets (20 mg total) by mouth daily. 15 tablet Jozette Castrellon, Darrelyn Hillock, FNP     PDMP not reviewed this encounter.   Emerson Monte, [place] 02/11/20 [phone]

## 2020-02-13 ENCOUNTER — Telehealth: Payer: Self-pay

## 2020-02-13 MED ORDER — TRIAMCINOLONE ACETONIDE 0.1 % EX CREA: 1.0000 "application " | TOPICAL_CREAM | Freq: Two times a day (BID) | CUTANEOUS | 0 refills | Status: DC

## 2020-02-18 ENCOUNTER — Other Ambulatory Visit: Payer: Self-pay

## 2020-02-18 ENCOUNTER — Emergency Department (HOSPITAL_COMMUNITY): Payer: Medicare Other

## 2020-02-18 ENCOUNTER — Encounter (HOSPITAL_COMMUNITY): Payer: Self-pay | Admitting: *Deleted

## 2020-02-18 ENCOUNTER — Inpatient Hospital Stay (HOSPITAL_COMMUNITY)
Admission: EM | Admit: 2020-02-18 | Discharge: 2020-02-22 | DRG: 871 | Disposition: A | Discharge status: Home Health | Payer: Medicare Other | Attending: Family Medicine | Admitting: Family Medicine

## 2020-02-18 DIAGNOSIS — G9341 Metabolic encephalopathy: Secondary | ICD-10-CM | POA: Diagnosis present

## 2020-02-18 DIAGNOSIS — J189 Pneumonia, unspecified organism: Secondary | ICD-10-CM | POA: Diagnosis present

## 2020-02-18 DIAGNOSIS — Z794 Long term (current) use of insulin: Secondary | ICD-10-CM | POA: Diagnosis not present

## 2020-02-18 DIAGNOSIS — R410 Disorientation, unspecified: Secondary | ICD-10-CM

## 2020-02-18 DIAGNOSIS — F200 Paranoid schizophrenia: Secondary | ICD-10-CM | POA: Diagnosis present

## 2020-02-18 DIAGNOSIS — E871 Hypo-osmolality and hyponatremia: Secondary | ICD-10-CM | POA: Diagnosis present

## 2020-02-18 DIAGNOSIS — K219 Gastro-esophageal reflux disease without esophagitis: Secondary | ICD-10-CM | POA: Diagnosis present

## 2020-02-18 DIAGNOSIS — I2510 Atherosclerotic heart disease of native coronary artery without angina pectoris: Secondary | ICD-10-CM | POA: Diagnosis present

## 2020-02-18 DIAGNOSIS — J440 Chronic obstructive pulmonary disease with acute lower respiratory infection: Secondary | ICD-10-CM | POA: Diagnosis present

## 2020-02-18 DIAGNOSIS — I10 Essential (primary) hypertension: Secondary | ICD-10-CM | POA: Diagnosis present

## 2020-02-18 DIAGNOSIS — D649 Anemia, unspecified: Secondary | ICD-10-CM | POA: Diagnosis present

## 2020-02-18 DIAGNOSIS — E1140 Type 2 diabetes mellitus with diabetic neuropathy, unspecified: Secondary | ICD-10-CM | POA: Diagnosis present

## 2020-02-18 DIAGNOSIS — I5032 Chronic diastolic (congestive) heart failure: Secondary | ICD-10-CM | POA: Diagnosis present

## 2020-02-18 DIAGNOSIS — I110 Hypertensive heart disease with heart failure: Secondary | ICD-10-CM | POA: Diagnosis present

## 2020-02-18 DIAGNOSIS — Z87891 Personal history of nicotine dependence: Secondary | ICD-10-CM

## 2020-02-18 DIAGNOSIS — E872 Acidosis: Secondary | ICD-10-CM | POA: Diagnosis present

## 2020-02-18 DIAGNOSIS — E7800 Pure hypercholesterolemia, unspecified: Secondary | ICD-10-CM | POA: Diagnosis present

## 2020-02-18 DIAGNOSIS — Z9981 Dependence on supplemental oxygen: Secondary | ICD-10-CM

## 2020-02-18 DIAGNOSIS — Z79899 Other long term (current) drug therapy: Secondary | ICD-10-CM

## 2020-02-18 DIAGNOSIS — Z8673 Personal history of transient ischemic attack (TIA), and cerebral infarction without residual deficits: Secondary | ICD-10-CM

## 2020-02-18 DIAGNOSIS — J441 Chronic obstructive pulmonary disease with (acute) exacerbation: Secondary | ICD-10-CM | POA: Diagnosis present

## 2020-02-18 DIAGNOSIS — G4733 Obstructive sleep apnea (adult) (pediatric): Secondary | ICD-10-CM | POA: Diagnosis present

## 2020-02-18 DIAGNOSIS — Z20822 Contact with and (suspected) exposure to covid-19: Secondary | ICD-10-CM | POA: Diagnosis present

## 2020-02-18 DIAGNOSIS — Z7901 Long term (current) use of anticoagulants: Secondary | ICD-10-CM

## 2020-02-18 DIAGNOSIS — Z9114 Patient's other noncompliance with medication regimen: Secondary | ICD-10-CM

## 2020-02-18 DIAGNOSIS — E662 Morbid (severe) obesity with alveolar hypoventilation: Secondary | ICD-10-CM | POA: Diagnosis present

## 2020-02-18 DIAGNOSIS — Z7951 Long term (current) use of inhaled steroids: Secondary | ICD-10-CM

## 2020-02-18 DIAGNOSIS — J9611 Chronic respiratory failure with hypoxia: Secondary | ICD-10-CM | POA: Diagnosis present

## 2020-02-18 DIAGNOSIS — Z7952 Long term (current) use of systemic steroids: Secondary | ICD-10-CM | POA: Diagnosis not present

## 2020-02-18 DIAGNOSIS — Z9119 Patient's noncompliance with other medical treatment and regimen: Secondary | ICD-10-CM

## 2020-02-18 DIAGNOSIS — R0600 Dyspnea, unspecified: Secondary | ICD-10-CM

## 2020-02-18 DIAGNOSIS — A419 Sepsis, unspecified organism: Secondary | ICD-10-CM | POA: Diagnosis present

## 2020-02-18 DIAGNOSIS — I480 Paroxysmal atrial fibrillation: Secondary | ICD-10-CM | POA: Diagnosis present

## 2020-02-18 LAB — COMPREHENSIVE METABOLIC PANEL
ALT: 14 U/L (ref 0–44)
AST: 14 U/L — ABNORMAL LOW (ref 15–41)
Albumin: 3.8 g/dL (ref 3.5–5.0)
Alkaline Phosphatase: 104 U/L (ref 38–126)
Anion gap: 11 (ref 5–15)
BUN: 15 mg/dL (ref 8–23)
CO2: 26 mmol/L (ref 22–32)
Calcium: 8.8 mg/dL — ABNORMAL LOW (ref 8.9–10.3)
Chloride: 91 mmol/L — ABNORMAL LOW (ref 98–111)
Creatinine, Ser: 1 mg/dL (ref 0.61–1.24)
GFR calc Af Amer: >60 mL/min (ref >60)
GFR calc non Af Amer: >60 mL/min (ref >60)
Glucose, Bld: 189 mg/dL — ABNORMAL HIGH (ref 70–99)
Potassium: 4.3 mmol/L (ref 3.5–5.1)
Sodium: 128 mmol/L — ABNORMAL LOW (ref 135–145)
Total Bilirubin: 0.6 mg/dL (ref 0.3–1.2)
Total Protein: 6.9 g/dL (ref 6.5–8.1)

## 2020-02-18 LAB — CBC WITH DIFFERENTIAL/PLATELET
Abs Immature Granulocytes: 0.07 10*3/uL (ref 0.00–0.07)
Basophils Absolute: 0 10*3/uL (ref 0.0–0.1)
Basophils Relative: 0 %
Eosinophils Absolute: 0 10*3/uL (ref 0.0–0.5)
Eosinophils Relative: 0 %
HCT: 38.7 % — ABNORMAL LOW (ref 39.0–52.0)
Hemoglobin: 11.3 g/dL — ABNORMAL LOW (ref 13.0–17.0)
Immature Granulocytes: 1 %
Lymphocytes Relative: 6 %
Lymphs Abs: 0.9 10*3/uL (ref 0.7–4.0)
MCH: 23 pg — ABNORMAL LOW (ref 26.0–34.0)
MCHC: 29.2 g/dL — ABNORMAL LOW (ref 30.0–36.0)
MCV: 78.8 fL — ABNORMAL LOW (ref 80.0–100.0)
Monocytes Absolute: 0.2 10*3/uL (ref 0.1–1.0)
Monocytes Relative: 2 %
Neutro Abs: 12.4 10*3/uL — ABNORMAL HIGH (ref 1.7–7.7)
Neutrophils Relative %: 91 %
Platelets: 315 10*3/uL (ref 150–400)
RBC: 4.91 MIL/uL (ref 4.22–5.81)
RDW: 14.6 % (ref 11.5–15.5)
WBC: 13.6 10*3/uL — ABNORMAL HIGH (ref 4.0–10.5)
nRBC: 0 % (ref 0.0–0.2)

## 2020-02-18 LAB — BLOOD GAS, ARTERIAL
Acid-Base Excess: 3.2 mmol/L — ABNORMAL HIGH (ref 0.0–2.0)
Bicarbonate: 27.2 mmol/L (ref 20.0–28.0)
FIO2: 21
O2 Saturation: 96 %
Patient temperature: 37
pCO2 arterial: 40.9 mmHg (ref 32.0–48.0)
pH, Arterial: 7.437 (ref 7.350–7.450)
pO2, Arterial: 78.2 mmHg — ABNORMAL LOW (ref 83.0–108.0)

## 2020-02-18 LAB — URINALYSIS, ROUTINE W REFLEX MICROSCOPIC
Bilirubin Urine: NEGATIVE
Glucose, UA: 150 mg/dL — AB
Hgb urine dipstick: NEGATIVE
Ketones, ur: NEGATIVE mg/dL
Leukocytes,Ua: NEGATIVE
Nitrite: NEGATIVE
Protein, ur: NEGATIVE mg/dL
Specific Gravity, Urine: 1.011 (ref 1.005–1.030)
pH: 5 (ref 5.0–8.0)

## 2020-02-18 LAB — LACTIC ACID, PLASMA
Lactic Acid, Venous: 2.2 mmol/L (ref 0.5–1.9)
Lactic Acid, Venous: 1.2 mmol/L (ref 0.5–1.9)

## 2020-02-18 LAB — CK: Total CK: 47 U/L — ABNORMAL LOW (ref 49–397)

## 2020-02-18 LAB — SARS CORONAVIRUS 2 BY RT PCR (HOSPITAL ORDER, PERFORMED IN ~~LOC~~ HOSPITAL LAB): SARS Coronavirus 2: NEGATIVE

## 2020-02-18 LAB — CBG MONITORING, ED: Glucose-Capillary: 251 mg/dL — ABNORMAL HIGH (ref 70–99)

## 2020-02-18 LAB — TROPONIN I (HIGH SENSITIVITY): Troponin I (High Sensitivity): 7 ng/L (ref <18)

## 2020-02-18 LAB — MAGNESIUM: Magnesium: 2 mg/dL (ref 1.7–2.4)

## 2020-02-18 MED ORDER — LEVOFLOXACIN IN D5W 500 MG/100ML IV SOLN
500.0000 mg | Freq: Once | INTRAVENOUS | Status: AC
  Administered 2020-02-18: 500 mg via INTRAVENOUS
  Filled 2020-02-18: qty 100

## 2020-02-18 MED ORDER — SODIUM CHLORIDE 0.9 % IV SOLN
100.0000 mg | Freq: Two times a day (BID) | INTRAVENOUS | Status: DC
  Administered 2020-02-19 – 2020-02-21 (×5): 100 mg via INTRAVENOUS
  Filled 2020-02-18 (×10): qty 100

## 2020-02-18 MED ORDER — LORAZEPAM 2 MG/ML IJ SOLN
INTRAMUSCULAR | Status: AC
  Filled 2020-02-18: qty 1

## 2020-02-18 MED ORDER — LORAZEPAM 2 MG/ML IJ SOLN
1.0000 mg | Freq: Once | INTRAMUSCULAR | Status: AC
  Administered 2020-02-18: 1 mg via INTRAVENOUS

## 2020-02-18 MED ORDER — LORAZEPAM 2 MG/ML IJ SOLN
0.5000 mg | INTRAMUSCULAR | Status: DC | PRN
  Administered 2020-02-19 – 2020-02-20 (×6): 0.5 mg via INTRAVENOUS
  Filled 2020-02-18 (×6): qty 1

## 2020-02-18 MED ORDER — UMECLIDINIUM-VILANTEROL 62.5-25 MCG/INH IN AEPB
1.0000 | INHALATION_SPRAY | Freq: Every day | RESPIRATORY_TRACT | Status: DC
  Administered 2020-02-19 – 2020-02-22 (×4): 1 via RESPIRATORY_TRACT
  Filled 2020-02-18: qty 14

## 2020-02-18 MED ORDER — SODIUM CHLORIDE 0.9 % IV SOLN: 1.0000 g | INTRAVENOUS | Status: DC

## 2020-02-18 MED ORDER — TIOTROPIUM BROMIDE MONOHYDRATE 18 MCG IN CAPS: 18.0000 ug | ORAL_CAPSULE | Freq: Every day | RESPIRATORY_TRACT | Status: DC

## 2020-02-18 MED ORDER — ONDANSETRON HCL 4 MG/2ML IJ SOLN: 4.0000 mg | Freq: Four times a day (QID) | INTRAMUSCULAR | Status: DC | PRN

## 2020-02-18 MED ORDER — INSULIN ASPART 100 UNIT/ML ~~LOC~~ SOLN
0.0000 [IU] | SUBCUTANEOUS | Status: DC
  Administered 2020-02-19: 3 [IU] via SUBCUTANEOUS
  Administered 2020-02-19: 5 [IU] via SUBCUTANEOUS
  Administered 2020-02-19: 3 [IU] via SUBCUTANEOUS
  Administered 2020-02-19: 5 [IU] via SUBCUTANEOUS
  Administered 2020-02-19 (×2): 8 [IU] via SUBCUTANEOUS
  Administered 2020-02-20: 5 [IU] via SUBCUTANEOUS
  Administered 2020-02-20 (×2): 8 [IU] via SUBCUTANEOUS
  Administered 2020-02-20: 3 [IU] via SUBCUTANEOUS
  Administered 2020-02-20 (×2): 8 [IU] via SUBCUTANEOUS
  Administered 2020-02-21 (×2): 5 [IU] via SUBCUTANEOUS
  Administered 2020-02-21 (×2): 3 [IU] via SUBCUTANEOUS
  Filled 2020-02-18 (×5): qty 1

## 2020-02-18 MED ORDER — ENOXAPARIN SODIUM 40 MG/0.4ML ~~LOC~~ SOLN
40.0000 mg | Freq: Every day | SUBCUTANEOUS | Status: DC
  Administered 2020-02-19 (×2): 40 mg via SUBCUTANEOUS
  Filled 2020-02-18 (×2): qty 0.4

## 2020-02-18 MED ORDER — SODIUM CHLORIDE 0.9 % IV BOLUS
1000.0000 mL | Freq: Once | INTRAVENOUS | Status: AC
  Administered 2020-02-18: 1000 mL via INTRAVENOUS

## 2020-02-18 MED ORDER — ONDANSETRON HCL 4 MG PO TABS: 4.0000 mg | ORAL_TABLET | Freq: Four times a day (QID) | ORAL | Status: DC | PRN

## 2020-02-18 MED ORDER — ACETAMINOPHEN 325 MG PO TABS
650.0000 mg | ORAL_TABLET | Freq: Four times a day (QID) | ORAL | Status: DC | PRN
  Administered 2020-02-19 – 2020-02-21 (×2): 650 mg via ORAL
  Filled 2020-02-18 (×2): qty 2

## 2020-02-18 MED ORDER — BUDESONIDE 0.25 MG/2ML IN SUSP
0.2500 mg | Freq: Two times a day (BID) | RESPIRATORY_TRACT | Status: DC
  Administered 2020-02-18 – 2020-02-22 (×8): 0.25 mg via RESPIRATORY_TRACT
  Filled 2020-02-18 (×8): qty 2

## 2020-02-18 MED ORDER — SODIUM CHLORIDE 0.9 % IV SOLN: 100.0000 mg | Freq: Two times a day (BID) | INTRAVENOUS | Status: DC

## 2020-02-18 MED ORDER — IPRATROPIUM-ALBUTEROL 0.5-2.5 (3) MG/3ML IN SOLN: 3.0000 mL | RESPIRATORY_TRACT | Status: DC | PRN

## 2020-02-18 MED ORDER — SODIUM CHLORIDE 0.9 % IV SOLN: INTRAVENOUS | Status: AC

## 2020-02-18 MED ORDER — ACETAMINOPHEN 650 MG RE SUPP
650.0000 mg | Freq: Four times a day (QID) | RECTAL | Status: DC | PRN
  Filled 2020-02-18: qty 1

## 2020-02-18 MED ORDER — SODIUM CHLORIDE 0.9 % IV SOLN
1.0000 g | INTRAVENOUS | Status: DC
  Administered 2020-02-19 – 2020-02-21 (×3): 1 g via INTRAVENOUS
  Filled 2020-02-18 (×3): qty 10

## 2020-02-18 MED ORDER — GUAIFENESIN-DM 100-10 MG/5ML PO SYRP: 5.0000 mL | ORAL_SOLUTION | ORAL | Status: DC | PRN

## 2020-02-18 NOTE — ED Notes (Signed)
Pt gone over to CT 

## 2020-02-18 NOTE — ED Notes (Signed)
Pt found w/out wearing O2 tubing, sats 88%, replaced tubing on face, sats now up to 100%. Mittens placed to prevent pt from pulling at lines and O2 tubing. MD made aware

## 2020-02-18 NOTE — ED Triage Notes (Signed)
Wife at home told ems pt's cbg 500, cbg with ems 243; pt yelling out; wife told ems that pt was last known well sometime yesterday

## 2020-02-18 NOTE — ED Triage Notes (Signed)
Pt and his wife have been here for 5 weeks and he has been altered. EMS reports patient has been talking out of his head and not making sense since yesterday. Left arm is more flaccid than normal.

## 2020-02-18 NOTE — H&P (Signed)
- History and Physical    Rick Cruz STM:[id number] DOB: 08/17/1946 DOA: 02/18/2020  PCP: [name], MD   Patient coming from: Home  Chief Complaint: Altered mental status  HPI: Rick Cruz is a 74 y.o. male with medical history significant for schizophrenia, diabetes mellitus, diastolic CHF, paroxysmal atrial fibrillation, chronic respiratory failure, OSA and obesity hypoventilation syndrome, COPD on 3 L O2. Patient was brought to the ED with reports of altered mental status, patient has been talking out of his head and confused over the last week but this has worsened over the past 2 days. Triage notes also report left arm been more flaccid.  At the time of my evaluation, patient is status post 1mg  ativan given as patient was agitated, so that blood work and imaging could be done.  Patient is sedated, and unable to give me history. I talked to patient spouse Rick Cruz, Rick Cruz on the phone, she reports that patient stopped taking all his medications 2 weeks ago.  Patient has been hollering, irritable all night.  And later today patient appears lethargic.  She checked his blood sugars and they were in the 500s, patient gave himself 30 units of insulin.  She denies noticing any weakness of his extremities.  She reports increased chest congestion over the past few days, but denies worsening cough or difficulty breathing.  No Fevers no, no chills.  She reports that due to his multiple medical problems, it is challenging taking care of him.  ED Course: Tachycardic to 103, temperature 98.5, blood pressure 130s to 150s O2 sats greater than 97% on room air, but later patient was placed on 2 L O2 as sats on room air was 88%.  WBC 13.6.  Mild lactic acidosis 2.2.  pH 7.4, PCO2 of 40.9, PO2 of 78.  Creatinine at baseline 1.  Sodium mildly low 128.  Early motion degraded but without acute intracranial abnormality.  Right base atelectasis or infiltrate.  IV Levaquin given, 1 L bolus.  Hospitalist to admit for  altered mental status.  Review of Systems: Patient sedated unable to answer questions.  Past Medical History:  Diagnosis Date  . Arthritis   . 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 CHF (congestive heart failure) (HCC)    diastolic   . Chronic pain   . COPD (chronic obstructive pulmonary disease) ([place])   . Depression   . Diabetes mellitus without complication (HCC)    insulin pump  . Hypercholesterolemia   . Hypertension   . On home O2    3 liters  . Oxygen deficiency   . Schizophrenia ([place])   . Sleep apnea    noncompliant with BiPAP  . Sleep apnea   . Tobacco abuse     Past Surgical History:  Procedure Laterality Date  . COLON SURGERY  March 2010   secondary to large colon polyp, final path per discharge summary notes tubulovillous adenoma.   . COLONOSCOPY  July 2011   Dr. Benson [place]: multiple polyps, internal and external hemorrhoids, diverticulosis. tubular adenoma  . ESOPHAGOGASTRODUODENOSCOPY (EGD) WITH PROPOFOL N/A 04/20/2013   Procedure: ESOPHAGOGASTRODUODENOSCOPY (EGD) WITH PROPOFOL;  Surgeon: Daneil Dolin, MD;  Location: [facility];  Service: Endoscopy;  Laterality: N/A;  . KNEE SURGERY     X2     reports that he quit smoking about 6 years ago. His smoking use included cigarettes. He has a 50.00 pack-year smoking history. He has never used smokeless tobacco. He reports that he does  not drink alcohol or use drugs.  Allergies  Allergen Reactions  . Phenergan [Promethazine Hcl] Other (See Comments)    Becomes very confused and aggressive  . Haldol [Haloperidol Lactate] Other (See Comments)    Shaking   . Metformin And Related Diarrhea    Family History  Problem Relation Age of Onset  . Thyroid disease Mother   . Parkinson's disease Father   . Parkinson's disease Other   . Colon cancer Neg Hx     Prior to Admission medications   Medication Sig Start Date End Date Taking? Authorizing Provider  albuterol (PROVENTIL) (2.5 MG/3ML) 0.083% nebulizer solution Take  3 mLs (2.5 mg total) by nebulization every 6 (six) hours as needed for wheezing or shortness of breath. 08/21/15  Yes Kathie Dike, MD  apixaban (ELIQUIS) 5 MG TABS tablet Take 1 tablet (5 mg total) by mouth 2 (two) times daily. 06/15/16  Yes Rai, Ripudeep K, MD  carboxymethylcellulose (REFRESH PLUS) 0.5 % SOLN Apply 1 drop to eye 3 (three) times daily as needed.   Yes [provider]  cholecalciferol (VITAMIN D) 1000 UNITS tablet Take 1,000 Units by mouth 2 (two) times daily.    Yes [provider]  Cinnamon 500 MG capsule Take 1,000 mg by mouth in the morning and at bedtime.    Yes [provider]  DULoxetine (CYMBALTA) 30 MG capsule Take 30 mg by mouth every morning.  02/02/19  Yes [provider]  Fluticasone-Umeclidin-Vilant (TRELEGY ELLIPTA) 100-62.5-25 MCG/INH AEPB Inhale 1 puff into the lungs daily. Samples given 10/01/18 by Dr. Luan Pulling   Yes [provider]  furosemide (LASIX) 20 MG tablet Take 40 mg by mouth daily.   Yes [provider]  gabapentin (NEURONTIN) 300 MG capsule Take 1 capsule (300 mg total) by mouth 3 (three) times daily. Patient taking differently: Take 300 mg by mouth 2 (two) times daily.  11/22/18  Yes Barton Dubois, MD  hydrOXYzine (ATARAX/VISTARIL) 25 MG tablet Take 1 tablet (25 mg total) by mouth every 12 (twelve) hours as needed for itching. Patient taking differently: Take 25 mg by mouth every 8 (eight) hours as needed for itching.  11/22/18  Yes Barton Dubois, MD  Insulin Glargine (LANTUS SOLOSTAR) 100 UNIT/ML Solostar Pen Inject 35 Units into the skin 2 (two) times daily. Per endocrine visit on 07/28/18 Patient taking differently: Inject 50 Units into the skin at bedtime.  04/27/19  Yes Virgel Manifold, MD  insulin lispro (HUMALOG) 100 UNIT/ML injection Inject 30 Units into the skin in the morning and at bedtime. Per sliding scale 02/24/19  Yes [provider]  meclizine (ANTIVERT) 25 MG tablet Take 25 mg by  mouth 3 (three) times daily as needed for dizziness.   Yes [provider]  melatonin 3 MG TABS tablet Take 6 mg by mouth at bedtime.   Yes [provider]  Multiple Vitamins-Minerals (CENTRUM SILVER 50+MEN PO) Take 1 tablet by mouth daily.   Yes [provider]  Multiple Vitamins-Minerals (PRESERVISION AREDS 2+MULTI VIT PO) Take 2 tablets by mouth in the morning and at bedtime.   Yes [provider]  OXYGEN Inhale 2 L into the lungs continuous.   Yes [provider]  pantoprazole (PROTONIX) 40 MG tablet Take 40 mg by mouth 2 (two) times daily.   Yes [provider]  predniSONE (DELTASONE) 10 MG tablet Take 2 tablets (20 mg total) by mouth daily. 02/11/20  Yes Avegno, Darrelyn Hillock, FNP  saw palmetto 160 MG capsule  Take 160 mg by mouth 2 (two) times daily.   Yes [provider]  simvastatin (ZOCOR) 40 MG tablet Take 40 mg by mouth every evening.   Yes [provider]  tiotropium (SPIRIVA) 18 MCG inhalation capsule Place into inhaler and inhale.   Yes [provider]  traMADol (ULTRAM) 50 MG tablet Take 100 mg by mouth 3 (three) times daily as needed for moderate pain.   Yes [provider]  traZODone (DESYREL) 100 MG tablet Take 100 mg by mouth at bedtime.    Yes [provider]  triamcinolone cream (KENALOG) 0.1 % Apply 1 application topically 2 (two) times daily. 02/11/20  Yes Avegno, Darrelyn Hillock, FNP  triamcinolone cream (KENALOG) 0.1 % Apply 1 application topically 2 (two) times daily. Patient not taking: Reported on 02/18/2020 02/13/20   Lestine Box, PA-C    Physical Exam: Exam limited by patient's mental status. Vitals:   02/18/20 1637 02/18/20 1830 02/18/20 1930  BP: (!) 138/94 (!) 144/116 (!) 153/112  Pulse: 100 (!) 102 (!) 103  Resp: 19 (!) 24 17  Temp: 98.5 F (36.9 C)    TempSrc: Oral    SpO2: 97% 98% 99%    Constitutional: Sedated status post Ativan. Vitals:   02/18/20 1637  02/18/20 1830 02/18/20 1930  BP: (!) 138/94 (!) 144/116 (!) 153/112  Pulse: 100 (!) 102 (!) 103  Resp: 19 (!) 24 17  Temp: 98.5 F (36.9 C)    TempSrc: Oral    SpO2: 97% 98% 99%   Eyes: PERRL, lids and conjunctivae normal ENMT: Mucous membranes are moist. Neck: normal, supple, no masses, no thyromegaly Respiratory: clear to auscultation bilaterally, no wheezing, no crackles. Normal respiratory effort. No accessory muscle use.  Cardiovascular: Tachycardic, regular rate and rhythm, no murmurs / rubs / gallops. No extremity edema. 2+ pedal pulses.  Abdomen: no tenderness, no masses palpated. No hepatosplenomegaly. Bowel sounds positive.  Musculoskeletal: no clubbing / cyanosis. No joint deformity upper and lower extremities. Good ROM, no contractures. Normal muscle tone.  Skin: no rashes, lesions, ulcers. No induration Neurologic: Sedated. Psychiatric: Sedated.   Labs on Admission: I have personally reviewed following labs and imaging studies  CBC: Recent Labs  Lab 02/18/20 1803  WBC 13.6*  NEUTROABS 12.4*  HGB 11.3*  HCT 38.7*  MCV 78.8*  PLT 793   Basic Metabolic Panel: Recent Labs  Lab 02/18/20 1803  NA 128*  K 4.3  CL 91*  CO2 26  GLUCOSE 189*  BUN 15  CREATININE 1.00  CALCIUM 8.8*   Liver Function Tests: Recent Labs  Lab 02/18/20 1803  AST 14*  ALT 14  ALKPHOS 104  BILITOT 0.6  PROT 6.9  ALBUMIN 3.8   Cardiac Enzymes: Recent Labs  Lab 02/18/20 1803  CKTOTAL 47*  Urine analysis:    Component Value Date/Time   COLORURINE YELLOW 02/18/2020 1702   APPEARANCEUR CLEAR 02/18/2020 1702   LABSPEC 1.011 02/18/2020 1702   PHURINE 5.0 02/18/2020 1702   GLUCOSEU 150 (A) 02/18/2020 1702   HGBUR NEGATIVE 02/18/2020 1702   BILIRUBINUR NEGATIVE 02/18/2020 1702   KETONESUR NEGATIVE 02/18/2020 1702   PROTEINUR NEGATIVE 02/18/2020 1702   UROBILINOGEN 1.0 12/05/2014 1519   NITRITE NEGATIVE 02/18/2020 1702   LEUKOCYTESUR NEGATIVE 02/18/2020 1702     Radiological Exams on Admission: CT Head Wo Contrast  Result Date: 02/18/2020 CLINICAL DATA:  Ataxia, stroke suspected. Additional history provided: Altered mental status, last known well yesterday, left arm more flaccid than normal, history of CHF,  diabetes mellitus, hypertension, schizophrenia. EXAM: CT HEAD WITHOUT CONTRAST TECHNIQUE: Contiguous axial images were obtained from the base of the skull through the vertex without intravenous contrast. COMPARISON:  Brain MRI 04/27/2019. FINDINGS: Brain: Mildly motion degraded examination. Stable mild-to-moderate generalized parenchymal atrophy. There is moderate ill-defined hypoattenuation within the cerebral white matter which is nonspecific, but consistent with chronic small vessel ischemic disease. Findings are similar as compared to prior head CT 04/26/2019. There is no acute intracranial hemorrhage. No demarcated cortical infarct. No extra-axial fluid collection. No evidence of intracranial mass. No midline shift. Vascular: No hyperdense vessel.  Atherosclerotic calcifications. Skull: Normal. Negative for fracture or focal lesion. Sinuses/Orbits: Visualized orbits show no acute finding. No significant paranasal sinus disease or mastoid effusion at the imaged levels. IMPRESSION: Mildly motion degraded examination. No evidence of acute intracranial abnormality. Stable generalized parenchymal atrophy and chronic small vessel ischemic disease. Electronically Signed   By: Kellie Simmering DO   On: 02/18/2020 18:57   DG Chest Port 1 View  Result Date: 02/18/2020 CLINICAL DATA:  Altered mental status EXAM: PORTABLE CHEST 1 VIEW COMPARISON:  04/26/2019 FINDINGS: Heart is borderline in size. Right basilar airspace opacities could reflect atelectasis or infiltrates. Left lung clear. No effusions or acute bony abnormality. IMPRESSION: Right base atelectasis or infiltrate. Electronically Signed   By: Rolm Baptise M.D.   On: 02/18/2020 17:34    EKG: Independently  reviewed.  Sinus tachycardia, rate 103.  Artifacts present.  No significant ST or T wave abnormalities compared to prior.  QTc prolonged 493.   Assessment/Plan Principal Problem:   Acute metabolic encephalopathy Active Problems:   Obstructive sleep apnea   Essential hypertension   Paranoid schizophrenia ([place])   CAD (coronary artery disease)   Type 2 diabetes mellitus without complication, with long-term current use of insulin (HCC)   Chronic diastolic CHF (congestive heart failure) (HCC)   OSA (obstructive sleep apnea)   Paroxysmal atrial fibrillation (HCC)   Acute metabolic encephalopathy- confusion.  Head CT unremarkable.  Etiology likely secondary to pneumonia, possible psych component, he is not on treatment for his paranoid schizophrenia at this time.  2 prior hospitalizations for metabolic encephalopathy in 2020-first episode was thought secondary to hyperglycemia, second episode work-up was unremarkable, thought secondary to psychiatric disorder, he had lost a son, was discharged to inpatient [facility]psych facility. -IV antibiotics -Cautious use of sedatives, and antipsychotics at this time, IV Ativan 0.5 every 4 hourly as needed -Hold Cymbalta, hydroxyzine, gabapentin - PT evaluation  Pneumonia-  Tachycardic, with leukocytosis of 13.6, with mild lactic acidosis of 2.2.  Rules in for sepsis/SIRS.  Spouse reports increased chest congestion over the past few days, denies worsening difficulty breathing.  Portable chest x-ray right base atelectasis or infiltrate.  O2 sats down to 88% on room air, currently on his home 2 L O2. -IV Levaquin started in the ED, continue IV ceftriaxone and doxycycline (QTC prolonged at 493) -Mucolytics as needed, supplemental O2 -Trend lactic acid -BMP, CBC a.m. - Blood cultures  Hyponatremia-128, baseline 131-135.  Not severe enough to account for altered mental status. -1 L bolus given in ED, continue N/s 75cc/hr x 15hrs -BMP a.m.  Prolonged QTC - 493,  potassium 4.3 -Check magnesium  Hypertension-stable. -Hold Lasix 40 mg daily at this time, has not taken any medications in 2 weeks.   Diabetes mellitus-random glucose 189. - SSI while n.p.o. -Hold home Lantus 35u BID and NovoLog 30 units twice daily  Diastolic congestive heart failure-stable and compensated.  Last echo 2017  EF 60 to 28%, grade 2 diastolic dysfunction. -Hold Lasix for now, gentle hydration  Atrial fibrillation -rate controlled and on anticoagulation - Eliquis held for now with n.p.o. status, resume in a.m. pending improvement in mental status  COPD, Chronic respiratory failure, OSA, OHS- on 3L at baseline. - CPAP QHS - resume home bronchodilators  DVT prophylaxis: lovenox Code Status: full code Family Communication: Spouse Rick Cruz on the phone.  Disposition Plan: > 2 days.  Pending improvement in cardiorespiratory status.  Pending PT evaluation may benefit from rehab. Consults called: None Admission status: inpatient I certify that at the point of admission it is my clinical judgment that the patient will require inpatient hospital care spanning beyond 2 midnights from the point of admission due to high intensity of service, high risk for further deterioration and high frequency of surveillance required. The following factors support the patient status of inpatient: Altered mental status and pneumonia requiring IV antibiotics and close monitoring.   Bethena Roys MD [facility]  02/18/2020, 10:43 PM

## 2020-02-18 NOTE — ED Notes (Signed)
Pt back from CT

## 2020-02-18 NOTE — ED Provider Notes (Signed)
[facility] EMERGENCY DEPARTMENT Provider Note   CSN: [id number] Arrival date & time: 02/18/20  1616     History Chief Complaint  Patient presents with   Altered Mental Status    Rick Cruz is a 74 y.o. male.  Patient wife states that the patient had not taken his medicines for about a week and has been very confused over the last week and worsened last 2 days.  Patient has a history of schizophrenia along with multiple medical problems.  He has been admitted for metabolic encephalopathy before.  The history is provided by the patient and a relative. No language interpreter was used.  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   CHF (congestive heart failure) (HCC)    diastolic    Chronic pain    COPD (chronic obstructive pulmonary disease) (HCC)    Depression    Diabetes mellitus without complication (HCC)    insulin pump   Hypercholesterolemia    Hypertension    On home O2    3 liters   Oxygen deficiency    Schizophrenia ([place])    Sleep apnea    noncompliant with BiPAP   Sleep apnea    Tobacco abuse     Patient Active Problem List   Diagnosis Date Noted   Heart failure ([place]) 02/02/2019   Hyperglycemia    Altered mental status 11/21/2018   Diabetic foot ([place]) 07/23/2018   Accidental fall 06/25/2018   Supplemental oxygen dependent 06/25/2018   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   Neck pain 01/09/2018   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   Diabetic neuropathy ([place]) 12/13/2017   Gastroesophageal reflux disease 12/13/2017   Insomnia 07/30/2017   Depression 07/19/2016   OSA (obstructive sleep  apnea)    Chronic respiratory failure with hypercapnia ([place])    Undifferentiated schizophrenia ([place])    Uncontrolled type 2 diabetes mellitus with complication (HCC)    Anemia of chronic disease    Paroxysmal atrial fibrillation ([place])    Hyponatremia 06/12/2016   Aspiration pneumonia ([place]) 08/16/2015   Seborrheic keratosis 08/04/2015   Pure hypercholesterolemia 07/05/2015   Obesity hypoventilation syndrome ([place]) 04/05/2015   CSA (central sleep apnea) 04/05/2015   Non compliance with medical treatment 04/05/2015   Type 2 diabetes mellitus with hyperglycemia ([place]) 02/28/2015   Acute respiratory failure with hypoxia and hypercarbia (HCC)    Chronic diastolic CHF (congestive heart failure) ([place]) 08/06/2014   Recurrent falls 05/08/2014   Ankle fracture, right 05/08/2014   Ankle fracture, left 05/08/2014   C. 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    COLONOSCOPY  July 2011   Dr. Benson [place]: multiple polyps, internal and external hemorrhoids, diverticulosis. tubular adenoma   ESOPHAGOGASTRODUODENOSCOPY (EGD) WITH PROPOFOL N/A 04/20/2013   Procedure: ESOPHAGOGASTRODUODENOSCOPY (EGD) WITH PROPOFOL;  Surgeon: Daneil Dolin, MD;  Location: [facility];  Service: Endoscopy;  Laterality: N/A;   KNEE SURGERY      X2       Family History  Problem Relation Age of Onset   Thyroid disease Mother    Parkinson's disease Father    Parkinson's disease Other    Colon cancer Neg Hx     Social History   Tobacco Use   Smoking status: Former Smoker    Packs/day: 1.00    Years: 50.00    Pack years: 50.00    Types: Cigarettes    Quit date: 09/16/2013    Years since quitting: 6.4   Smokeless tobacco: Never Used  Substance Use Topics   Alcohol use: No    Comment: history of ETOH abuse in remote past, none in at least 10 years   Drug use: No    Home Medications Prior to Admission medications   Medication Sig Start Date End Date Taking? Authorizing Provider  albuterol (PROVENTIL) (2.5 MG/3ML) 0.083% nebulizer solution Take 3 mLs (2.5 mg total) by nebulization every 6 (six) hours as needed for wheezing or shortness of breath. 08/21/15  Yes Kathie Dike, MD  apixaban (ELIQUIS) 5 MG TABS tablet Take 1 tablet (5 mg total) by mouth 2 (two) times daily. 06/15/16  Yes Rai, Ripudeep K, MD  carboxymethylcellulose (REFRESH PLUS) 0.5 % SOLN Apply 1 drop to eye 3 (three) times daily as needed.   Yes [provider]  cholecalciferol (VITAMIN D) 1000 UNITS tablet Take 1,000 Units by mouth 2 (two) times daily.    Yes [provider]  Cinnamon 500 MG capsule Take 1,000 mg by mouth in the morning and at bedtime.    Yes [provider]  DULoxetine (CYMBALTA) 30 MG capsule Take 30 mg by mouth every morning.  02/02/19  Yes [provider]  Fluticasone-Umeclidin-Vilant (TRELEGY ELLIPTA) 100-62.5-25 MCG/INH AEPB Inhale 1 puff into the lungs daily. Samples given 10/01/18 by Dr. Luan Pulling   Yes [provider]  furosemide (LASIX) 20 MG tablet Take 40 mg by mouth daily.   Yes [provider]  gabapentin (NEURONTIN) 300 MG capsule Take 1 capsule (300 mg total) by mouth 3 (three) times daily. Patient taking differently: Take 300 mg by mouth 2 (two) times daily.   11/22/18  Yes Barton Dubois, MD  hydrOXYzine (ATARAX/VISTARIL) 25 MG tablet Take 1 tablet (25 mg total) by mouth every 12 (twelve) hours as needed for itching. Patient taking differently: Take 25 mg by mouth every 8 (eight) hours as needed for itching.  11/22/18  Yes Barton Dubois, MD  Insulin Glargine (LANTUS SOLOSTAR) 100 UNIT/ML Solostar Pen Inject 35 Units into the skin 2 (two) times daily. Per endocrine visit on 07/28/18 Patient taking differently: Inject 50 Units into the skin at bedtime.  04/27/19  Yes Virgel Manifold, MD  insulin lispro (HUMALOG) 100 UNIT/ML injection Inject 30 Units into the skin in the morning and at bedtime. Per sliding scale 02/24/19  Yes [provider]  meclizine (ANTIVERT) 25 MG tablet Take 25 mg by mouth 3 (three) times daily as needed for dizziness.   Yes [provider]  melatonin 3 MG TABS tablet Take 6 mg by mouth at  bedtime.   Yes [provider]  Multiple Vitamins-Minerals (CENTRUM SILVER 50+MEN PO) Take 1 tablet by mouth daily.   Yes [provider]  Multiple Vitamins-Minerals (PRESERVISION AREDS 2+MULTI VIT PO) Take 2 tablets by mouth in the morning and at bedtime.   Yes [provider]  OXYGEN Inhale 2 L into the lungs continuous.   Yes [provider]  pantoprazole (PROTONIX) 40 MG tablet Take 40 mg by mouth 2 (two) times daily.   Yes [provider]  predniSONE (DELTASONE) 10 MG tablet Take 2 tablets (20 mg total) by mouth daily. 02/11/20  Yes Avegno, Darrelyn Hillock, FNP  saw palmetto 160 MG capsule Take 160 mg by mouth 2 (two) times daily.   Yes [provider]  simvastatin (ZOCOR) 40 MG tablet Take 40 mg by mouth every evening.   Yes [provider]  tiotropium (SPIRIVA) 18 MCG inhalation capsule Place into inhaler and inhale.   Yes [provider]  traMADol (ULTRAM) 50 MG tablet Take 100 mg by mouth 3 (three) times daily as needed for moderate pain.   Yes [provider]  traZODone (DESYREL) 100 MG tablet Take 100 mg by mouth at bedtime.    Yes [provider]  triamcinolone cream (KENALOG) 0.1 % Apply 1 application topically 2 (two) times daily. 02/11/20  Yes Avegno, Darrelyn Hillock, FNP  triamcinolone cream (KENALOG) 0.1 % Apply 1 application topically 2 (two) times daily. Patient not taking: Reported on 02/18/2020 02/13/20   [place], [place], PA-C    Allergies    Phenergan [promethazine hcl], Haldol [haloperidol lactate], and Metformin and related  Review of Systems   Review of Systems  Unable to perform ROS: Mental status change  Gastrointestinal: Negative for abdominal pain.    Physical Exam Updated Vital Signs BP (!) 153/112    Pulse (!) 103    Temp 98.5 F (36.9 C) (Oral)    Resp 17    SpO2 99%   Physical Exam Vitals and nursing note reviewed.  Constitutional:      Appearance: He is well-developed.  HENT:     Head: Normocephalic.     Nose: Nose normal.     Mouth/Throat:     Mouth: Mucous membranes are moist.  Eyes:     General: No scleral icterus.    Conjunctiva/sclera: Conjunctivae normal.  Neck:     Thyroid: No thyromegaly.  Cardiovascular:     Rate and Rhythm: Regular rhythm. Tachycardia present.     Heart sounds: No murmur. No friction rub. No gallop.   Pulmonary:     Breath sounds: No stridor. No wheezing or rales.  Chest:     Chest wall: No tenderness.  Abdominal:     General: There is no distension.     Tenderness: There is no abdominal tenderness. There is no rebound.  Musculoskeletal:        General: Normal range of motion.     Cervical back: Neck supple.  Lymphadenopathy:     Cervical: No cervical adenopathy.  Skin:    Findings: No erythema or rash.  Neurological:     Mental Status: He is alert.     Motor: No abnormal muscle tone.     Coordination: Coordination normal.     Comments: Oriented to person only  Psychiatric:     Comments: Patient very agitated and combative     ED Results / Procedures /  Treatments   Labs (all labs ordered are listed, but only abnormal results  are displayed) Labs Reviewed  CBC WITH DIFFERENTIAL/PLATELET - Abnormal; Notable for the following components:      Result Value   WBC 13.6 (*)    Hemoglobin 11.3 (*)    HCT 38.7 (*)    MCV 78.8 (*)    MCH 23.0 (*)    MCHC 29.2 (*)    Neutro Abs 12.4 (*)    All other components within normal limits  COMPREHENSIVE METABOLIC PANEL - Abnormal; Notable for the following components:   Sodium 128 (*)    Chloride 91 (*)    Glucose, Bld 189 (*)    Calcium 8.8 (*)    AST 14 (*)    All other components within normal limits  URINALYSIS, ROUTINE W REFLEX MICROSCOPIC - Abnormal; Notable for the following components:   Glucose, UA 150 (*)    All other components within normal limits  CK - Abnormal; Notable for the following components:   Total CK 47 (*)    All other components within normal limits  BLOOD GAS, ARTERIAL - Abnormal; Notable for the following components:   pO2, Arterial 78.2 (*)    Acid-Base Excess 3.2 (*)    All other components within normal limits  LACTIC ACID, PLASMA - Abnormal; Notable for the following components:   Lactic Acid, Venous 2.2 (*)    All other components within normal limits  TROPONIN I (HIGH SENSITIVITY)  TROPONIN I (HIGH SENSITIVITY)    EKG None  Radiology CT Head Wo Contrast  Result Date: 02/18/2020 CLINICAL DATA:  Ataxia, stroke suspected. Additional history provided: Altered mental status, last known well yesterday, left arm more flaccid than normal, history of CHF, diabetes mellitus, hypertension, schizophrenia. EXAM: CT HEAD WITHOUT CONTRAST TECHNIQUE: Contiguous axial images were obtained from the base of the skull through the vertex without intravenous contrast. COMPARISON:  Brain MRI 04/27/2019. FINDINGS: Brain: Mildly motion degraded examination. Stable mild-to-moderate generalized parenchymal atrophy. There is moderate ill-defined hypoattenuation within the cerebral white  matter which is nonspecific, but consistent with chronic small vessel ischemic disease. Findings are similar as compared to prior head CT 04/26/2019. There is no acute intracranial hemorrhage. No demarcated cortical infarct. No extra-axial fluid collection. No evidence of intracranial mass. No midline shift. Vascular: No hyperdense vessel.  Atherosclerotic calcifications. Skull: Normal. Negative for fracture or focal lesion. Sinuses/Orbits: Visualized orbits show no acute finding. No significant paranasal sinus disease or mastoid effusion at the imaged levels. IMPRESSION: Mildly motion degraded examination. No evidence of acute intracranial abnormality. Stable generalized parenchymal atrophy and chronic small vessel ischemic disease. Electronically Signed   By: Kellie Simmering DO   On: 02/18/2020 18:57   DG Chest Port 1 View  Result Date: 02/18/2020 CLINICAL DATA:  Altered mental status EXAM: PORTABLE CHEST 1 VIEW COMPARISON:  04/26/2019 FINDINGS: Heart is borderline in size. Right basilar airspace opacities could reflect atelectasis or infiltrates. Left lung clear. No effusions or acute bony abnormality. IMPRESSION: Right base atelectasis or infiltrate. Electronically Signed   By: Rolm Baptise M.D.   On: 02/18/2020 17:34  Patient was given Ativan for severe agitation  Procedures Procedures (including critical care time)  Medications Ordered in ED Medications  sodium chloride 0.9 % bolus 1,000 mL (1,000 mLs Intravenous New Bag/Given 02/18/20 1822)  LORazepam (ATIVAN) injection 1 mg (1 mg Intravenous Given 02/18/20 1750)  levofloxacin (LEVAQUIN) IVPB 500 mg (0 mg Intravenous Stopped 02/18/20 2050)    ED Course  I have reviewed the triage vital signs and the nursing notes.  Pertinent labs &  imaging results that were available during my care of the patient were reviewed by me and considered in my medical decision making (see chart for details).    CRITICAL CARE Performed by: Milton Ferguson Total critical  care time: 45 minutes Critical care time was exclusive of separately billable procedures and treating other patients. Critical care was necessary to treat or prevent imminent or life-threatening deterioration. Critical care was time spent personally by me on the following activities: development of treatment plan with patient and/or surrogate as well as nursing, discussions with consultants, evaluation of patient's response to treatment, examination of patient, obtaining history from patient or surrogate, ordering and performing treatments and interventions, ordering and review of laboratory studies, ordering and review of radiographic studies, pulse oximetry and re-evaluation of patient's condition.  MDM Rules/Calculators/A&P                     Patient with metabolic encephalopathy.  He will be admitted to medicine.       This patient presents to the ED for concern of confusion this involves an extensive number of treatment options, and is a complaint that carries with it a high risk of complications and morbidity.  The differential diagnosis includes sepsis metabolic encephalopathy   Lab Tests:   I Ordered, reviewed, and interpreted labs, which included CBC and chemistries that showed elevated white count low sodium  Medicines ordered:   I ordered medication Ativan for agitation  Imaging Studies ordered:   I ordered imaging studies which included chest x-ray and CT of head and  I independently visualized and interpreted imaging which showed unremarkable  Additional history obtained:   Additional history obtained from wife  Previous records obtained and reviewed   Consultations Obtained:   I consulted hospitalist and discussed lab and imaging findings  Reevaluation:  After the interventions stated above, I reevaluated the patient and found no improvement  Critical Interventions:        Final Clinical Impression(s) / ED Diagnoses Final diagnoses:    Confusion  Acute metabolic encephalopathy    Rx / DC Orders ED Discharge Orders    None       Milton Ferguson, MD 02/18/20 2101

## 2020-02-19 DIAGNOSIS — G9341 Metabolic encephalopathy: Secondary | ICD-10-CM

## 2020-02-19 LAB — BASIC METABOLIC PANEL
Anion gap: 9 (ref 5–15)
BUN: 12 mg/dL (ref 8–23)
CO2: 28 mmol/L (ref 22–32)
Calcium: 8.6 mg/dL — ABNORMAL LOW (ref 8.9–10.3)
Chloride: 97 mmol/L — ABNORMAL LOW (ref 98–111)
Creatinine, Ser: 1 mg/dL (ref 0.61–1.24)
GFR calc Af Amer: >60 mL/min (ref >60)
GFR calc non Af Amer: >60 mL/min (ref >60)
Glucose, Bld: 188 mg/dL — ABNORMAL HIGH (ref 70–99)
Potassium: 3.8 mmol/L (ref 3.5–5.1)
Sodium: 134 mmol/L — ABNORMAL LOW (ref 135–145)

## 2020-02-19 LAB — CBC
HCT: 35.7 % — ABNORMAL LOW (ref 39.0–52.0)
Hemoglobin: 10.4 g/dL — ABNORMAL LOW (ref 13.0–17.0)
MCH: 23 pg — ABNORMAL LOW (ref 26.0–34.0)
MCHC: 29.1 g/dL — ABNORMAL LOW (ref 30.0–36.0)
MCV: 79 fL — ABNORMAL LOW (ref 80.0–100.0)
Platelets: 283 10*3/uL (ref 150–400)
RBC: 4.52 MIL/uL (ref 4.22–5.81)
RDW: 14.6 % (ref 11.5–15.5)
WBC: 13.4 10*3/uL — ABNORMAL HIGH (ref 4.0–10.5)
nRBC: 0 % (ref 0.0–0.2)

## 2020-02-19 LAB — CBG MONITORING, ED
Glucose-Capillary: 174 mg/dL — ABNORMAL HIGH (ref 70–99)
Glucose-Capillary: 175 mg/dL — ABNORMAL HIGH (ref 70–99)
Glucose-Capillary: 223 mg/dL — ABNORMAL HIGH (ref 70–99)

## 2020-02-19 LAB — GLUCOSE, CAPILLARY
Glucose-Capillary: 289 mg/dL — ABNORMAL HIGH (ref 70–99)
Glucose-Capillary: 222 mg/dL — ABNORMAL HIGH (ref 70–99)

## 2020-02-19 MED ORDER — CHLORHEXIDINE GLUCONATE CLOTH 2 % EX PADS
6.0000 | MEDICATED_PAD | Freq: Every day | CUTANEOUS | Status: DC
  Administered 2020-02-19 – 2020-02-20 (×2): 6 via TOPICAL

## 2020-02-19 NOTE — Progress Notes (Addendum)
Patient Demographics:    Rick Cruz, is a 74 y.o. male, DOB - 1945-12-06, QIH:[id number]  Admit date - 02/18/2020   Admitting Physician Ejiroghene Arlyce Dice, MD  Outpatient Primary MD for the patient is [name], MD  LOS - 1   Chief Complaint  Patient presents with  . Altered Mental Status        Subjective:    Rick Cruz today has no fevers, no emesis,  No chest pain,   -Episodes of confusion and disorientation persist  Assessment  & Plan :    Principal Problem:   Acute metabolic encephalopathy Active Problems:   Obstructive sleep apnea   Essential hypertension   Paranoid schizophrenia ([place])   CAD (coronary artery disease)   Type 2 diabetes mellitus without complication, with long-term current use of insulin (HCC)   Chronic diastolic CHF (congestive heart failure) (HCC)   OSA (obstructive sleep apnea)   Paroxysmal atrial fibrillation (HCC)  Brief Summary:-  74 y.o. male with medical history significant for schizophrenia, diabetes mellitus, diastolic CHF, paroxysmal atrial fibrillation, chronic respiratory failure, OSA and obesity hypoventilation syndrome, COPD on 3 L O2--admitted on 02/18/2020 with acute metabolic encephalopathy most likely due to pneumonia in the setting of underlying schizophrenia  A/p 1) acute metabolic encephalopathy -Continue IV Rocephin and doxycycline hy--- presumed secondary to pneumonia, as well as mucolytics and bronchodilators -Patient continues to have episodes of hypoxia--continue supplemental oxygen  2) morbid obesity/OSA/hypoventilation syndrome--- to use CPAP nightly  3)Schizophrenia--with behavioral disturbance --holding off on risperidone or Seroquel due to prolonged QT concerns, may use lorazepam as needed  4)Sepsis secondary to Pneumonia--- on admission patient met sepsis criteria, treat as above in #1 -Lactic acid normalized after IV fluids  dropping from 2.2 to 1.2  5)DM2-holding Lantus insulin due to poor oral intake in the setting of metabolic encephalopathy--Use Novolog/Humalog Sliding scale insulin with Accu-Cheks/Fingersticks as ordered   6)HFpEF--last known EF 60 to 65% patient with history of grade 2 diastolic dysfunction -Lasix on hold in view of sepsis and poor oral intake due to metabolic encephalopathy -Be judicious with IV fluids to avoid volume overload  7)Hyponatremia--- sodium up to 134 from 128 with gentle IV normal saline hydration  Disposition/Need for in-Hospital Stay- patient unable to be discharged at this time due to --sepsis secondary to pneumonia requiring IV antibiotics and IV fluids  Status is: Inpatient  Remains inpatient appropriate because:Altered mental status, IV treatments appropriate due to intensity of illness or inability to take PO and Inpatient level of care appropriate due to severity of illness   Dispo: The patient is from: Home              Anticipated d/c is to: SNF              Anticipated d/c date is: 2 days              Patient currently is not medically stable to d/c.  Barriers: Not Clinically Stable-persistent metabolic encephalopathy  Code Status : full  Family Communication:   Spouse--left message at [phone] Consults  :  na  DVT Prophylaxis  :  Lovenox - - SCDs   Lab Results  Component Value Date   PLT 283 02/19/2020    Inpatient Medications  Scheduled Meds: . budesonide (PULMICORT) nebulizer solution  0.25 mg Nebulization BID  . Chlorhexidine Gluconate Cloth  6 each Topical Daily  . enoxaparin (LOVENOX) injection  40 mg Subcutaneous QHS  . insulin aspart  0-15 Units Subcutaneous Q4H  . umeclidinium-vilanterol  1 puff Inhalation Daily   Continuous Infusions: . cefTRIAXone (ROCEPHIN)  IV Stopped (02/19/20 0901)  . doxycycline (VIBRAMYCIN) IV Stopped (02/19/20 1210)   PRN Meds:.acetaminophen **OR** acetaminophen, guaiFENesin-dextromethorphan,  ipratropium-albuterol, LORazepam, ondansetron **OR** ondansetron (ZOFRAN) IV    Anti-infectives (From admission, onward)   Start     Dose/Rate Route Frequency Ordered Stop   02/19/20 0800  cefTRIAXone (ROCEPHIN) 1 g in sodium chloride 0.9 % 100 mL IVPB     1 g 200 mL/hr over 30 Minutes Intravenous Every 24 hours 02/18/20 2231     02/19/20 0800  doxycycline (VIBRAMYCIN) 100 mg in sodium chloride 0.9 % 250 mL IVPB     100 mg 125 mL/hr over 120 Minutes Intravenous Every 12 hours 02/18/20 2231     02/18/20 2245  cefTRIAXone (ROCEPHIN) 1 g in sodium chloride 0.9 % 100 mL IVPB  Status:  Discontinued     1 g 200 mL/hr over 30 Minutes Intravenous Every 24 hours 02/18/20 2231 02/18/20 2231   02/18/20 2245  doxycycline (VIBRAMYCIN) 100 mg in sodium chloride 0.9 % 250 mL IVPB  Status:  Discontinued     100 mg 125 mL/hr over 120 Minutes Intravenous Every 12 hours 02/18/20 2231 02/18/20 2231   02/18/20 1845  levofloxacin (LEVAQUIN) IVPB 500 mg     500 mg 100 mL/hr over 60 Minutes Intravenous  Once 02/18/20 1830 02/18/20 2050        Objective:   Vitals:   02/19/20 1245 02/19/20 1300 02/19/20 1427 02/19/20 1800  BP:  (!) 111/93 (!) 122/106 138/80  Pulse: 87  76 (!) 101  Resp: (!) '9  18 20  '$ Temp:   98.2 F (36.8 C) 97.8 F (36.6 C)  TempSrc:   Oral Oral  SpO2: 100%  92% 96%    Wt Readings from Last 3 Encounters:  02/11/20 77.1 kg  11/25/18 121.9 kg  01/13/18 136.1 kg     Intake/Output Summary (Last 24 hours) at 02/19/2020 1922 Last data filed at 02/19/2020 1700 Gross per 24 hour  Intake 340.58 ml  Output 1050 ml  Net -709.42 ml   Physical Exam  Gen:-Remains confused and disoriented intermittently HEENT:- [place].AT, No sclera icterus Neck-Supple Neck,No JVD,.  Nose- [place] 3 L/min Lungs-diminished breath sounds, no significant wheezing CV- S1, S2 normal, regular  Abd-  +ve B.Sounds, Abd Soft, No tenderness,    Extremity/Skin:- No  edema, pedal pulses present  Psych-confused and  disoriented, agitated from time to time Neuro --generalized weakness no new focal deficits, no tremors   Data Review:   Micro Results Recent Results (from the past 240 hour(s))  SARS Coronavirus 2 by RT PCR (hospital order, performed in [facility] hospital lab) Nasopharyngeal Nasopharyngeal Swab     Status: None   Collection Time: 02/18/20  9:00 PM   Specimen: Nasopharyngeal Swab  Result Value Ref Range Status   SARS Coronavirus 2 NEGATIVE NEGATIVE Final    Comment: (NOTE) SARS-CoV-2 target nucleic acids are NOT DETECTED. The SARS-CoV-2 RNA is generally detectable in upper and lower respiratory specimens during the acute phase of infection. The lowest concentration of SARS-CoV-2 viral copies this assay can detect is 250 copies / mL. A negative result does not preclude SARS-CoV-2 infection and should not  be used as the sole basis for treatment or other patient management decisions.  A negative result may occur with improper specimen collection / handling, submission of specimen other than nasopharyngeal swab, presence of viral mutation(s) within the areas targeted by this assay, and inadequate number of viral copies (<250 copies / mL). A negative result must be combined with clinical observations, patient history, and epidemiological information. Fact Sheet for Patients:   StrictlyIdeas.no Fact Sheet for Healthcare Providers: BankingDealers.co.za This test is not yet approved or cleared  by the [place] FDA and has been authorized for detection and/or diagnosis of SARS-CoV-2 by FDA under an Emergency Use Authorization (EUA).  This EUA will remain in effect (meaning this test can be used) for the duration of the COVID-19 declaration under Section 564(b)(1) of the Act, 21 U.S.C. section 360bbb-3(b)(1), unless the authorization is terminated or revoked sooner. Performed at [facility], [address] [place], [place] [address]     Culture, blood (routine x 2)     Status: None (Preliminary result)   Collection Time: 02/18/20 11:00 PM   Specimen: BLOOD  Result Value Ref Range Status   Specimen Description BLOOD RIGHT ANTECUBITAL  Final   Special Requests   Final    BOTTLES DRAWN AEROBIC AND ANAEROBIC Blood Culture adequate volume   Culture   Final    NO GROWTH < 12 HOURS Performed at [facility], [address], [place], [place] [address]    Report Status PENDING  Incomplete  Culture, blood (routine x 2)     Status: None (Preliminary result)   Collection Time: 02/18/20 11:06 PM   Specimen: BLOOD  Result Value Ref Range Status   Specimen Description BLOOD RIGHT ANTECUBITAL  Final   Special Requests   Final    BOTTLES DRAWN AEROBIC AND ANAEROBIC Blood Culture adequate volume   Culture   Final    NO GROWTH < 12 HOURS Performed at [facility], [address], [place], [place] [address]    Report Status PENDING  Incomplete    Radiology Reports CT Head Wo Contrast  Result Date: 02/18/2020 CLINICAL DATA:  Ataxia, stroke suspected. Additional history provided: Altered mental status, last known well yesterday, left arm more flaccid than normal, history of CHF, diabetes mellitus, hypertension, schizophrenia. EXAM: CT HEAD WITHOUT CONTRAST TECHNIQUE: Contiguous axial images were obtained from the base of the skull through the vertex without intravenous contrast. COMPARISON:  Brain MRI 04/27/2019. FINDINGS: Brain: Mildly motion degraded examination. Stable mild-to-moderate generalized parenchymal atrophy. There is moderate ill-defined hypoattenuation within the cerebral white matter which is nonspecific, but consistent with chronic small vessel ischemic disease. Findings are similar as compared to prior head CT 04/26/2019. There is no acute intracranial hemorrhage. No demarcated cortical infarct. No extra-axial fluid collection. No evidence of intracranial mass. No midline shift. Vascular: No hyperdense vessel.   Atherosclerotic calcifications. Skull: Normal. Negative for fracture or focal lesion. Sinuses/Orbits: Visualized orbits show no acute finding. No significant paranasal sinus disease or mastoid effusion at the imaged levels. IMPRESSION: Mildly motion degraded examination. No evidence of acute intracranial abnormality. Stable generalized parenchymal atrophy and chronic small vessel ischemic disease. Electronically Signed   By: Kellie Simmering DO   On: 02/18/2020 18:57   DG Chest Port 1 View  Result Date: 02/18/2020 CLINICAL DATA:  Altered mental status EXAM: PORTABLE CHEST 1 VIEW COMPARISON:  04/26/2019 FINDINGS: Heart is borderline in size. Right basilar airspace opacities could reflect atelectasis or infiltrates. Left lung clear. No effusions or acute bony  abnormality. IMPRESSION: Right base atelectasis or infiltrate. Electronically Signed   By: Rolm Baptise M.D.   On: 02/18/2020 17:34     CBC Recent Labs  Lab 02/18/20 1803 02/19/20 0400  WBC 13.6* 13.4*  HGB 11.3* 10.4*  HCT 38.7* 35.7*  PLT 315 283  MCV 78.8* 79.0*  MCH 23.0* 23.0*  MCHC 29.2* 29.1*  RDW 14.6 14.6  LYMPHSABS 0.9  --   MONOABS 0.2  --   EOSABS 0.0  --   BASOSABS 0.0  --     Chemistries  Recent Labs  Lab 02/18/20 1803 02/19/20 0400  NA 128* 134*  K 4.3 3.8  CL 91* 97*  CO2 26 28  GLUCOSE 189* 188*  BUN 15 12  CREATININE 1.00 1.00  CALCIUM 8.8* 8.6*  MG 2.0  --   AST 14*  --   ALT 14  --   ALKPHOS 104  --   BILITOT 0.6  --    ------------------------------------------------------------------------------------------------------------------ No results for input(s): CHOL, HDL, LDLCALC, TRIG, CHOLHDL, LDLDIRECT in the last 72 hours.  Lab Results  Component Value Date   HGBA1C 11.4 (H) 11/22/2018   ------------------------------------------------------------------------------------------------------------------ No results for input(s): TSH, T4TOTAL, T3FREE, THYROIDAB in the last 72 hours.  Invalid  input(s): FREET3 ------------------------------------------------------------------------------------------------------------------ No results for input(s): VITAMINB12, FOLATE, FERRITIN, TIBC, IRON, RETICCTPCT in the last 72 hours.  Coagulation profile No results for input(s): INR, PROTIME in the last 168 hours.  No results for input(s): DDIMER in the last 72 hours.  Cardiac Enzymes No results for input(s): CKMB, TROPONINI, MYOGLOBIN in the last 168 hours.  Invalid input(s): CK ------------------------------------------------------------------------------------------------------------------    Component Value Date/Time   BNP 29.0 11/21/2018 1601     Sebert Stollings M.D on 02/19/2020 at 7:22 PM  Go to [email or web address] - for contact info  [facility] - Office  [phone]

## 2020-02-20 ENCOUNTER — Inpatient Hospital Stay (HOSPITAL_COMMUNITY): Payer: Medicare Other

## 2020-02-20 LAB — GLUCOSE, CAPILLARY
Glucose-Capillary: 192 mg/dL — ABNORMAL HIGH (ref 70–99)
Glucose-Capillary: 229 mg/dL — ABNORMAL HIGH (ref 70–99)
Glucose-Capillary: 259 mg/dL — ABNORMAL HIGH (ref 70–99)
Glucose-Capillary: 263 mg/dL — ABNORMAL HIGH (ref 70–99)
Glucose-Capillary: 270 mg/dL — ABNORMAL HIGH (ref 70–99)
Glucose-Capillary: 270 mg/dL — ABNORMAL HIGH (ref 70–99)

## 2020-02-20 MED ORDER — HALOPERIDOL LACTATE 5 MG/ML IJ SOLN: 5.0000 mg | Freq: Once | INTRAMUSCULAR | Status: DC

## 2020-02-20 MED ORDER — QUETIAPINE FUMARATE 25 MG PO TABS
25.0000 mg | ORAL_TABLET | Freq: Two times a day (BID) | ORAL | Status: DC
  Administered 2020-02-20 – 2020-02-21 (×3): 25 mg via ORAL
  Filled 2020-02-20 (×3): qty 1

## 2020-02-20 MED ORDER — LORAZEPAM 2 MG/ML IJ SOLN
1.0000 mg | INTRAMUSCULAR | Status: DC | PRN
  Administered 2020-02-20 – 2020-02-21 (×3): 1 mg via INTRAVENOUS
  Filled 2020-02-20 (×3): qty 1

## 2020-02-20 MED ORDER — APIXABAN 5 MG PO TABS
5.0000 mg | ORAL_TABLET | Freq: Two times a day (BID) | ORAL | Status: DC
  Administered 2020-02-20 – 2020-02-22 (×4): 5 mg via ORAL
  Filled 2020-02-20 (×4): qty 1

## 2020-02-20 NOTE — Progress Notes (Signed)
Notified Dr. Humphrey Rolls of pt becoming combative and attempting to get out of bed multiple times after fall. Safety sitter ordered and haldol order given, however pt is allergic to haldol. Notified Dr. Joesph Fillers of this, awaiting new orders for combativeness. Sitter with patient at this time.

## 2020-02-20 NOTE — Progress Notes (Signed)
Patient Demographics:    Rick Cruz, is a 74 y.o. male, DOB - 1945-12-31, OVZ:[id number]  Admit date - 02/18/2020   Admitting Physician Ejiroghene Arlyce Dice, MD  Outpatient Primary MD for the patient is [name], MD  LOS - 2   Chief Complaint  Patient presents with  . Altered Mental Status        Subjective:    [name] today has no fevers, no emesis,  No chest pain,   -Episodes of confusion and disorientation persist Agitated from time to time Discussed with wife by phone  Assessment  & Plan :    Principal Problem:   Acute metabolic encephalopathy Active Problems:   Obstructive sleep apnea   Essential hypertension   Paranoid schizophrenia ([place])   CAD (coronary artery disease)   Type 2 diabetes mellitus without complication, with long-term current use of insulin (HCC)   Chronic diastolic CHF (congestive heart failure) (HCC)   OSA (obstructive sleep apnea)   Paroxysmal atrial fibrillation ([place])  Brief Summary:-  74 y.o. male with medical history significant for schizophrenia, diabetes mellitus, diastolic CHF, paroxysmal atrial fibrillation, chronic respiratory failure, OSA and obesity hypoventilation syndrome, COPD on 3 L O2--admitted on 02/18/2020 with acute metabolic encephalopathy most likely due to pneumonia in the setting of underlying schizophrenia  A/p 1) acute metabolic encephalopathy -Continue IV Rocephin and doxycycline hy--- presumed secondary to pneumonia, as well as mucolytics and bronchodilators -Patient continues to have episodes of hypoxia--continue supplemental oxygen - per wife was not compliant with anti-psychotics for at least 1 month PTA  2) morbid obesity/OSA/hypoventilation syndrome---  use CPAP nightly  3)Schizophrenia--with behavioral disturbance --start Seroquel 25 mg bid (avoid high doses due to prolonged QT concerns) -per wife was not compliant with  anti-psychotics for at least 1 month PTA --  use lorazepam as needed  4)Sepsis secondary to Pneumonia--- on admission patient met sepsis criteria, treat as above in #1 -Lactic acid normalized after IV fluids dropping from 2.2 to 1.2 -Repeat chest x-ray in a.m.  5)DM2-holding Lantus insulin due to poor oral intake in the setting of metabolic encephalopathy--Use Novolog/Humalog Sliding scale insulin with Accu-Cheks/Fingersticks as ordered   6)HFpEF--last known EF 60 to 65% patient with history of grade 2 diastolic dysfunction -Lasix on hold in view of sepsis and poor oral intake due to metabolic encephalopathy -Be judicious with IV fluids to avoid volume overload  7)Hyponatremia--- sodium up to 134 from 128 with gentle IV normal saline hydration  8)PAFib--- h/o occular CVA, c/n Eliquis  Disposition/Need for in-Hospital Stay- patient unable to be discharged at this time due to --sepsis secondary to pneumonia requiring IV antibiotics and IV fluids  Status is: Inpatient  Remains inpatient appropriate because:Altered mental status, IV treatments appropriate due to intensity of illness or inability to take PO and Inpatient level of care appropriate due to severity of illness   Dispo: The patient is from: Home              Anticipated d/c is to: SNF              Anticipated d/c date is: 2 days              Patient currently is not medically stable to d/c.  Barriers:  Not Clinically Stable-persistent metabolic encephalopathy  Code Status : full  Family Communication:   Spouse-Discussed with wife at [phone] Consults  :  na  DVT Prophylaxis  :  Lovenox - - SCDs   Lab Results  Component Value Date   PLT 283 02/19/2020    Inpatient Medications  Scheduled Meds: . budesonide (PULMICORT) nebulizer solution  0.25 mg Nebulization BID  . Chlorhexidine Gluconate Cloth  6 each Topical Daily  . enoxaparin (LOVENOX) injection  40 mg Subcutaneous QHS  . insulin aspart  0-15 Units  Subcutaneous Q4H  . QUEtiapine  25 mg Oral BID  . umeclidinium-vilanterol  1 puff Inhalation Daily   Continuous Infusions: . cefTRIAXone (ROCEPHIN)  IV Stopped (02/20/20 0916)  . doxycycline (VIBRAMYCIN) IV Stopped (02/20/20 1437)   PRN Meds:.acetaminophen **OR** acetaminophen, guaiFENesin-dextromethorphan, ipratropium-albuterol, LORazepam, ondansetron **OR** ondansetron (ZOFRAN) IV    Anti-infectives (From admission, onward)   Start     Dose/Rate Route Frequency Ordered Stop   02/19/20 0800  cefTRIAXone (ROCEPHIN) 1 g in sodium chloride 0.9 % 100 mL IVPB     1 g 200 mL/hr over 30 Minutes Intravenous Every 24 hours 02/18/20 2231     02/19/20 0800  doxycycline (VIBRAMYCIN) 100 mg in sodium chloride 0.9 % 250 mL IVPB     100 mg 125 mL/hr over 120 Minutes Intravenous Every 12 hours 02/18/20 2231     02/18/20 2245  cefTRIAXone (ROCEPHIN) 1 g in sodium chloride 0.9 % 100 mL IVPB  Status:  Discontinued     1 g 200 mL/hr over 30 Minutes Intravenous Every 24 hours 02/18/20 2231 02/18/20 2231   02/18/20 2245  doxycycline (VIBRAMYCIN) 100 mg in sodium chloride 0.9 % 250 mL IVPB  Status:  Discontinued     100 mg 125 mL/hr over 120 Minutes Intravenous Every 12 hours 02/18/20 2231 02/18/20 2231   02/18/20 1845  levofloxacin (LEVAQUIN) IVPB 500 mg     500 mg 100 mL/hr over 60 Minutes Intravenous  Once 02/18/20 1830 02/18/20 2050        Objective:   Vitals:   02/20/20 0213 02/20/20 0535 02/20/20 0807 02/20/20 0811  BP: (!) 147/86 (!) 152/95    Pulse: 97 (!) 104    Resp: 17 19    Temp: (!) 96.9 F (36.1 C) (!) 97.2 F (36.2 C)    TempSrc: Axillary Axillary    SpO2: 99% 100% 95% 94%    Wt Readings from Last 3 Encounters:  02/11/20 77.1 kg  11/25/18 121.9 kg  01/13/18 136.1 kg     Intake/Output Summary (Last 24 hours) at 02/20/2020 1659 Last data filed at 02/20/2020 1500 Gross per 24 hour  Intake 940 ml  Output 1000 ml  Net -60 ml   Physical Exam  Gen:-Remains confused and  disoriented intermittently HEENT:- [place].AT, No sclera icterus Neck-Supple Neck,No JVD,.  Nose- [place] 3 L/min Lungs-diminished breath sounds, no significant wheezing CV- S1, S2 normal, regular  Abd-  +ve B.Sounds, Abd Soft, No tenderness,    Extremity/Skin:- No  edema, pedal pulses present  Psych-confused and disoriented, agitated from time to time Neuro --generalized weakness no new focal deficits, no tremors GU- foley --to be removed   Data Review:   Micro Results Recent Results (from the past 240 hour(s))  SARS Coronavirus 2 by RT PCR (hospital order, performed in [facility] hospital lab) Nasopharyngeal Nasopharyngeal Swab     Status: None   Collection Time: 02/18/20  9:00 PM   Specimen: Nasopharyngeal Swab  Result Value   Ref Range Status   SARS Coronavirus 2 NEGATIVE NEGATIVE Final    Comment: (NOTE) SARS-CoV-2 target nucleic acids are NOT DETECTED. The SARS-CoV-2 RNA is generally detectable in upper and lower respiratory specimens during the acute phase of infection. The lowest concentration of SARS-CoV-2 viral copies this assay can detect is 250 copies / mL. A negative result does not preclude SARS-CoV-2 infection and should not be used as the sole basis for treatment or other patient management decisions.  A negative result may occur with improper specimen collection / handling, submission of specimen other than nasopharyngeal swab, presence of viral mutation(s) within the areas targeted by this assay, and inadequate number of viral copies (<250 copies / mL). A negative result must be combined with clinical observations, patient history, and epidemiological information. Fact Sheet for Patients:   https://www.fda.gov/media/136312/download Fact Sheet for Healthcare Providers: https://www.fda.gov/media/136313/download This test is not yet approved or cleared  by the [place] FDA and has been authorized for detection and/or diagnosis of SARS-CoV-2 by FDA under an Emergency Use  Authorization (EUA).  This EUA will remain in effect (meaning this test can be used) for the duration of the COVID-19 declaration under Section 564(b)(1) of the Act, 21 U.S.C. section 360bbb-3(b)(1), unless the authorization is terminated or revoked sooner. Performed at [facility], [address] [place], [place] [address]   Culture, blood (routine x 2)     Status: None (Preliminary result)   Collection Time: 02/18/20 11:00 PM   Specimen: BLOOD  Result Value Ref Range Status   Specimen Description BLOOD RIGHT ANTECUBITAL  Final   Special Requests   Final    BOTTLES DRAWN AEROBIC AND ANAEROBIC Blood Culture adequate volume   Culture   Final    NO GROWTH 2 DAYS Performed at [facility], [address] [place], [place] [address]    Report Status PENDING  Incomplete  Culture, blood (routine x 2)     Status: None (Preliminary result)   Collection Time: 02/18/20 11:06 PM   Specimen: BLOOD  Result Value Ref Range Status   Specimen Description BLOOD RIGHT ANTECUBITAL  Final   Special Requests   Final    BOTTLES DRAWN AEROBIC AND ANAEROBIC Blood Culture adequate volume   Culture   Final    NO GROWTH 2 DAYS Performed at [facility], [address] [place], [place] [address]    Report Status PENDING  Incomplete    Radiology Reports CT Head Wo Contrast  Result Date: 02/18/2020 CLINICAL DATA:  Ataxia, stroke suspected. Additional history provided: Altered mental status, last known well yesterday, left arm more flaccid than normal, history of CHF, diabetes mellitus, hypertension, schizophrenia. EXAM: CT HEAD WITHOUT CONTRAST TECHNIQUE: Contiguous axial images were obtained from the base of the skull through the vertex without intravenous contrast. COMPARISON:  Brain MRI 04/27/2019. FINDINGS: Brain: Mildly motion degraded examination. Stable mild-to-moderate generalized parenchymal atrophy. There is moderate ill-defined hypoattenuation within the cerebral white matter which is nonspecific,  but consistent with chronic small vessel ischemic disease. Findings are similar as compared to prior head CT 04/26/2019. There is no acute intracranial hemorrhage. No demarcated cortical infarct. No extra-axial fluid collection. No evidence of intracranial mass. No midline shift. Vascular: No hyperdense vessel.  Atherosclerotic calcifications. Skull: Normal. Negative for fracture or focal lesion. Sinuses/Orbits: Visualized orbits show no acute finding. No significant paranasal sinus disease or mastoid effusion at the imaged levels. IMPRESSION: Mildly motion degraded examination. No evidence of acute intracranial abnormality. Stable generalized parenchymal atrophy and chronic small   vessel ischemic disease. Electronically Signed   By: Kyle  Golden DO   On: 02/18/2020 18:57   DG Chest Port 1 View  Result Date: 02/18/2020 CLINICAL DATA:  Altered mental status EXAM: PORTABLE CHEST 1 VIEW COMPARISON:  04/26/2019 FINDINGS: Heart is borderline in size. Right basilar airspace opacities could reflect atelectasis or infiltrates. Left lung clear. No effusions or acute bony abnormality. IMPRESSION: Right base atelectasis or infiltrate. Electronically Signed   By: Kevin  Dover M.D.   On: 02/18/2020 17:34     CBC Recent Labs  Lab 02/18/20 1803 02/19/20 0400  WBC 13.6* 13.4*  HGB 11.3* 10.4*  HCT 38.7* 35.7*  PLT 315 283  MCV 78.8* 79.0*  MCH 23.0* 23.0*  MCHC 29.2* 29.1*  RDW 14.6 14.6  LYMPHSABS 0.9  --   MONOABS 0.2  --   EOSABS 0.0  --   BASOSABS 0.0  --     Chemistries  Recent Labs  Lab 02/18/20 1803 02/19/20 0400  NA 128* 134*  K 4.3 3.8  CL 91* 97*  CO2 26 28  GLUCOSE 189* 188*  BUN 15 12  CREATININE 1.00 1.00  CALCIUM 8.8* 8.6*  MG 2.0  --   AST 14*  --   ALT 14  --   ALKPHOS 104  --   BILITOT 0.6  --    ------------------------------------------------------------------------------------------------------------------ No results for input(s): CHOL, HDL, LDLCALC, TRIG, CHOLHDL,  LDLDIRECT in the last 72 hours.  Lab Results  Component Value Date   HGBA1C 11.4 (H) 11/22/2018   ------------------------------------------------------------------------------------------------------------------ No results for input(s): TSH, T4TOTAL, T3FREE, THYROIDAB in the last 72 hours.  Invalid input(s): FREET3 ------------------------------------------------------------------------------------------------------------------ No results for input(s): VITAMINB12, FOLATE, FERRITIN, TIBC, IRON, RETICCTPCT in the last 72 hours.  Coagulation profile No results for input(s): INR, PROTIME in the last 168 hours.  No results for input(s): DDIMER in the last 72 hours.  Cardiac Enzymes No results for input(s): CKMB, TROPONINI, MYOGLOBIN in the last 168 hours.  Invalid input(s): CK ------------------------------------------------------------------------------------------------------------------    Component Value Date/Time   BNP 29.0 11/21/2018 1601      M.D on 02/20/2020 at 4:59 PM  Go to [email or web address] - for contact info  [facility] - Office  [phone]       

## 2020-02-20 NOTE — Progress Notes (Signed)
Pt climbed over bed rails and attempted to get up unassisted. Appeared to have held on to bedside table and fell to knees. Small laceration noted to left hand, cleansed and applied band-aid. Denies pain, notified AC Abby. Dr. Humphrey Rolls aware, no new orders. Attempted to notify wife Theadore Blunck, no answer, left message to return call. Will continue to monitor.

## 2020-02-20 NOTE — Progress Notes (Signed)
Patient wasn't placed on CPAP due him pulling at his leads and O2 at this time. A unit is on standby for patient when he is alert and calm enough to wear.Nursing made aware of decision to hold off at this time.

## 2020-02-21 LAB — BASIC METABOLIC PANEL
Anion gap: 7 (ref 5–15)
BUN: 8 mg/dL (ref 8–23)
CO2: 29 mmol/L (ref 22–32)
Calcium: 9.2 mg/dL (ref 8.9–10.3)
Chloride: 102 mmol/L (ref 98–111)
Creatinine, Ser: 0.81 mg/dL (ref 0.61–1.24)
GFR calc Af Amer: >60 mL/min (ref >60)
GFR calc non Af Amer: >60 mL/min (ref >60)
Glucose, Bld: 225 mg/dL — ABNORMAL HIGH (ref 70–99)
Potassium: 4.3 mmol/L (ref 3.5–5.1)
Sodium: 138 mmol/L (ref 135–145)

## 2020-02-21 LAB — GLUCOSE, CAPILLARY
Glucose-Capillary: 182 mg/dL — ABNORMAL HIGH (ref 70–99)
Glucose-Capillary: 191 mg/dL — ABNORMAL HIGH (ref 70–99)
Glucose-Capillary: 201 mg/dL — ABNORMAL HIGH (ref 70–99)
Glucose-Capillary: 290 mg/dL — ABNORMAL HIGH (ref 70–99)
Glucose-Capillary: 298 mg/dL — ABNORMAL HIGH (ref 70–99)
Glucose-Capillary: 314 mg/dL — ABNORMAL HIGH (ref 70–99)
Glucose-Capillary: 328 mg/dL — ABNORMAL HIGH (ref 70–99)

## 2020-02-21 LAB — CBC
HCT: 36.4 % — ABNORMAL LOW (ref 39.0–52.0)
Hemoglobin: 10.6 g/dL — ABNORMAL LOW (ref 13.0–17.0)
MCH: 23.1 pg — ABNORMAL LOW (ref 26.0–34.0)
MCHC: 29.1 g/dL — ABNORMAL LOW (ref 30.0–36.0)
MCV: 79.5 fL — ABNORMAL LOW (ref 80.0–100.0)
Platelets: 264 10*3/uL (ref 150–400)
RBC: 4.58 MIL/uL (ref 4.22–5.81)
RDW: 15.2 % (ref 11.5–15.5)
WBC: 8.8 10*3/uL (ref 4.0–10.5)
nRBC: 0 % (ref 0.0–0.2)

## 2020-02-21 MED ORDER — BISACODYL 10 MG RE SUPP
10.0000 mg | Freq: Once | RECTAL | Status: AC
  Administered 2020-02-21: 10 mg via RECTAL
  Filled 2020-02-21: qty 1

## 2020-02-21 MED ORDER — METOPROLOL TARTRATE 25 MG PO TABS: 25.0000 mg | ORAL_TABLET | Freq: Two times a day (BID) | ORAL | Status: DC

## 2020-02-21 MED ORDER — INSULIN GLARGINE 100 UNIT/ML ~~LOC~~ SOLN
10.0000 [IU] | Freq: Every day | SUBCUTANEOUS | Status: DC
  Administered 2020-02-21: 10 [IU] via SUBCUTANEOUS
  Filled 2020-02-21 (×2): qty 0.1

## 2020-02-21 MED ORDER — INSULIN ASPART 100 UNIT/ML ~~LOC~~ SOLN
3.0000 [IU] | Freq: Three times a day (TID) | SUBCUTANEOUS | Status: DC
  Administered 2020-02-21 – 2020-02-22 (×4): 3 [IU] via SUBCUTANEOUS

## 2020-02-21 MED ORDER — DOXYCYCLINE HYCLATE 100 MG PO TABS
100.0000 mg | ORAL_TABLET | Freq: Two times a day (BID) | ORAL | Status: DC
  Administered 2020-02-21 – 2020-02-22 (×3): 100 mg via ORAL
  Filled 2020-02-21 (×3): qty 1

## 2020-02-21 MED ORDER — QUETIAPINE FUMARATE 25 MG PO TABS: 50.0000 mg | ORAL_TABLET | Freq: Every day | ORAL | Status: DC

## 2020-02-21 MED ORDER — INSULIN ASPART 100 UNIT/ML ~~LOC~~ SOLN
0.0000 [IU] | Freq: Every day | SUBCUTANEOUS | Status: DC
  Administered 2020-02-21: 4 [IU] via SUBCUTANEOUS

## 2020-02-21 MED ORDER — METOPROLOL TARTRATE 50 MG PO TABS
50.0000 mg | ORAL_TABLET | Freq: Two times a day (BID) | ORAL | Status: DC
  Administered 2020-02-21 – 2020-02-22 (×2): 50 mg via ORAL
  Filled 2020-02-21 (×2): qty 1

## 2020-02-21 MED ORDER — LORAZEPAM 2 MG/ML IJ SOLN
0.5000 mg | Freq: Three times a day (TID) | INTRAMUSCULAR | Status: DC | PRN
  Administered 2020-02-21: 0.5 mg via INTRAVENOUS
  Filled 2020-02-21: qty 1

## 2020-02-21 MED ORDER — QUETIAPINE FUMARATE 25 MG PO TABS
25.0000 mg | ORAL_TABLET | Freq: Every morning | ORAL | Status: DC
  Administered 2020-02-22: 25 mg via ORAL
  Filled 2020-02-21: qty 1

## 2020-02-21 MED ORDER — INSULIN ASPART 100 UNIT/ML ~~LOC~~ SOLN
0.0000 [IU] | Freq: Three times a day (TID) | SUBCUTANEOUS | Status: DC
  Administered 2020-02-21 – 2020-02-22 (×2): 11 [IU] via SUBCUTANEOUS
  Administered 2020-02-22: 5 [IU] via SUBCUTANEOUS
  Administered 2020-02-22: 3 [IU] via SUBCUTANEOUS

## 2020-02-21 NOTE — Progress Notes (Signed)
Patient cooperative this morning, alert and oriented. Mitts removed to eat breakfast. After breakfast, patient was falling asleep with food particles in his mouth. When instructed to spit out food or swallow, patient began yelling and swinging at staff. Mitts reapplied, sitter remains at bedside. Will continue to monitor.

## 2020-02-21 NOTE — Progress Notes (Signed)
Patient very combative today. Alert and oriented to person, place, time and situation. Cooperative at times, sitter remains at bedside. IV Ativan used PRN. EKG ordered but patient combative and will not hold still to complete EKG. Dr. Joesph Fillers made aware. Will continue to monitor.

## 2020-02-21 NOTE — Progress Notes (Signed)
Patient cooperative at this time. EKG completed, drink and crackers given. Mitts are off the patient at this time. Will continue to monitor.

## 2020-02-21 NOTE — Progress Notes (Signed)
Patient Demographics:    Rick Cruz, is a 74 y.o. male, DOB - 11/01/1945, EQJ:[id number]  Admit date - 02/18/2020   Admitting Physician Ejiroghene Arlyce Dice, MD  Outpatient Primary MD for the patient is [name], MD  LOS - 3  Chief Complaint  Patient presents with  . Altered Mental Status        Subjective:    [name] today has no fevers, no emesis,  No chest pain,   --off restraints -more cooperative Eating and drinking okay, no BM  Assessment  & Plan :    Principal Problem:   Acute metabolic encephalopathy Active Problems:   Obstructive sleep apnea   Essential hypertension   Paranoid schizophrenia (HCC)   CAD (coronary artery disease)   Type 2 diabetes mellitus without complication, with long-term current use of insulin (HCC)   Chronic diastolic CHF (congestive heart failure) (HCC)   OSA (obstructive sleep apnea)   Paroxysmal atrial fibrillation (HCC)   Brief Summary:-  74 y.o. male with medical history significant for schizophrenia, diabetes mellitus, diastolic CHF, paroxysmal atrial fibrillation, chronic respiratory failure, OSA and obesity hypoventilation syndrome, COPD on 3 L O2--admitted on 02/18/2020 with acute metabolic encephalopathy most likely due to pneumonia in the setting of underlying schizophrenia -More cooperative, more alert  A/p 1) acute metabolic encephalopathy -Patient with longstanding history of mental illness including schizophrenia - per wife was not compliant with anti-psychotics for at least 1 month PTA -Mentation has improved significantly on Seroquel -Off restraints -EKG on 02/21/2020 with QTC less than 450  2) morbid obesity/OSA/hypoventilation syndrome---  use CPAP nightly  3)Schizophrenia--with behavioral disturbance --  -Please see management as above #1 --  use lorazepam as needed  4) suspected sepsis secondary to Pneumonia--- on admission  patient met sepsis criteria, treat as above in #1 -Lactic acid normalized after IV fluids dropping from 2.2 to 1.2 -WBC is down to 8.8 from peak of 13.6 on admission -Blood cultures from 02/18/2020 NGTD -Repeat chest x-ray on 02/20/2020 without pneumonia -Okay to stop IV Rocephin -Change doxycycline to p.o. for COPD exacerbation  5)DM2-restart Lantus at reduced dose of 10 units nightly, PTA patient was on 35 units twice daily  --Use Novolog/Humalog Sliding scale insulin with Accu-Cheks/Fingersticks as ordered   6)HFpEF--last known EF 60 to 65% patient with history of grade 2 diastolic dysfunction -Change IV fluids to KVO,  -Consider restarting Lasix when oral intake is more reliable  7)Hyponatremia--- sodium up to 138 from 128 with gentle IV normal saline hydration -Change IV fluids to KVO  8)PAFib--- h/o occular CVA, c/n Eliquis  9) acute on chronic anemia--hemoglobin down to 10.6 with hydration, on admission hemoglobin was 11.3, baseline hemoglobin between 11 and 12 usually -Borderline low MCV and low MCHC with normal RDW noted -Suspect some component of hemodilution -No evidence of GI bleed or other bleeding at this time however patient is on Eliquis -Check stool for occult blood -Check serum iron, TIBC, ferritin , B12 and folate  Disposition/Need for in-Hospital Stay- patient unable to be discharged at this time due to --wife states patient is difficult to manage at this time, requesting PT eval and possible SNF rehab placement  Status is: Inpatient  Remains inpatient appropriate because:Altered mental status, IV treatments appropriate  due to intensity of illness or inability to take PO and Inpatient level of care appropriate due to severity of illness   Dispo: The patient is from: Home              Anticipated d/c is to: SNF              Anticipated d/c date is: 2 days              Patient currently is not medically stable to d/c. Barriers: Not Clinically Stable-persistent  metabolic encephalopathy -wife states patient is difficult to manage at this time, requesting PT eval and possible SNF rehab placement  Code Status : full  Family Communication:   Spouse-Discussed with wife at [phone] Consults  :  na  DVT Prophylaxis  : Apixaban- - SCDs   Lab Results  Component Value Date   PLT 264 02/21/2020    Inpatient Medications  Scheduled Meds: . apixaban  5 mg Oral BID  . bisacodyl  10 mg Rectal Once  . budesonide (PULMICORT) nebulizer solution  0.25 mg Nebulization BID  . Chlorhexidine Gluconate Cloth  6 each Topical Daily  . doxycycline  100 mg Oral Q12H  . insulin aspart  0-15 Units Subcutaneous TID WC  . insulin aspart  0-5 Units Subcutaneous QHS  . insulin aspart  3 Units Subcutaneous TID WC  . insulin glargine  10 Units Subcutaneous QHS  . metoprolol tartrate  50 mg Oral BID  . [START ON 02/22/2020] QUEtiapine  25 mg Oral q morning - 10a  . [START ON 02/22/2020] QUEtiapine  50 mg Oral QHS  . umeclidinium-vilanterol  1 puff Inhalation Daily   Continuous Infusions:  PRN Meds:.acetaminophen **OR** acetaminophen, guaiFENesin-dextromethorphan, ipratropium-albuterol, LORazepam, ondansetron **OR** ondansetron (ZOFRAN) IV    Anti-infectives (From admission, onward)   Start     Dose/Rate Route Frequency Ordered Stop   02/21/20 1515  doxycycline (VIBRA-TABS) tablet 100 mg     100 mg Oral Every 12 hours 02/21/20 1507     02/19/20 0800  cefTRIAXone (ROCEPHIN) 1 g in sodium chloride 0.9 % 100 mL IVPB  Status:  Discontinued     1 g 200 mL/hr over 30 Minutes Intravenous Every 24 hours 02/18/20 2231 02/21/20 1507   02/19/20 0800  doxycycline (VIBRAMYCIN) 100 mg in sodium chloride 0.9 % 250 mL IVPB  Status:  Discontinued     100 mg 125 mL/hr over 120 Minutes Intravenous Every 12 hours 02/18/20 2231 02/21/20 1507   02/18/20 2245  cefTRIAXone (ROCEPHIN) 1 g in sodium chloride 0.9 % 100 mL IVPB  Status:  Discontinued     1 g 200 mL/hr over 30 Minutes  Intravenous Every 24 hours 02/18/20 2231 02/18/20 2231   02/18/20 2245  doxycycline (VIBRAMYCIN) 100 mg in sodium chloride 0.9 % 250 mL IVPB  Status:  Discontinued     100 mg 125 mL/hr over 120 Minutes Intravenous Every 12 hours 02/18/20 2231 02/18/20 2231   02/18/20 1845  levofloxacin (LEVAQUIN) IVPB 500 mg     500 mg 100 mL/hr over 60 Minutes Intravenous  Once 02/18/20 1830 02/18/20 2050        Objective:   Vitals:   02/21/20 0640 02/21/20 0942 02/21/20 0946 02/21/20 1449  BP: (!) 164/137   (!) 156/70  Pulse: 98   86  Resp:    18  Temp:    97.9 F (36.6 C)  TempSrc:    Oral  SpO2:  91% 94% 94%    Wt  Readings from Last 3 Encounters:  02/11/20 77.1 kg  11/25/18 121.9 kg  01/13/18 136.1 kg     Intake/Output Summary (Last 24 hours) at 02/21/2020 1524 Last data filed at 02/21/2020 1450 Gross per 24 hour  Intake 720 ml  Output 1800 ml  Net -1080 ml   Physical Exam  Gen:-Much less confused, more cooperative  HEENT:- [place].AT, No sclera icterus Nose- [place] 2 L/min Neck-Supple Neck,No JVD,.  Nose- [place] 3 L/min Lungs-improving air movement,, no significant wheezing  CV- S1, S2 normal, regular  Abd-  +ve B.Sounds, Abd Soft, No tenderness,    Extremity/Skin:- No  edema, pedal pulses present  Psych-c less confused, less agitated, more cooperative  neuro --generalized weakness no new focal deficits, no tremors GU- foley -- removed, voiding well   Data Review:   Micro Results Recent Results (from the past 240 hour(s))  SARS Coronavirus 2 by RT PCR (hospital order, performed in [facility] hospital lab) Nasopharyngeal Nasopharyngeal Swab     Status: None   Collection Time: 02/18/20  9:00 PM   Specimen: Nasopharyngeal Swab  Result Value Ref Range Status   SARS Coronavirus 2 NEGATIVE NEGATIVE Final    Comment: (NOTE) SARS-CoV-2 target nucleic acids are NOT DETECTED. The SARS-CoV-2 RNA is generally detectable in upper and lower respiratory specimens during the acute phase of  infection. The lowest concentration of SARS-CoV-2 viral copies this assay can detect is 250 copies / mL. A negative result does not preclude SARS-CoV-2 infection and should not be used as the sole basis for treatment or other patient management decisions.  A negative result may occur with improper specimen collection / handling, submission of specimen other than nasopharyngeal swab, presence of viral mutation(s) within the areas targeted by this assay, and inadequate number of viral copies (<250 copies / mL). A negative result must be combined with clinical observations, patient history, and epidemiological information. Fact Sheet for Patients:   StrictlyIdeas.no Fact Sheet for Healthcare Providers: BankingDealers.co.za This test is not yet approved or cleared  by the [place] FDA and has been authorized for detection and/or diagnosis of SARS-CoV-2 by FDA under an Emergency Use Authorization (EUA).  This EUA will remain in effect (meaning this test can be used) for the duration of the COVID-19 declaration under Section 564(b)(1) of the Act, 21 U.S.C. section 360bbb-3(b)(1), unless the authorization is terminated or revoked sooner. Performed at [facility], [address] [place], [place] [address]   Culture, blood (routine x 2)     Status: None (Preliminary result)   Collection Time: 02/18/20 11:00 PM   Specimen: BLOOD  Result Value Ref Range Status   Specimen Description BLOOD RIGHT ANTECUBITAL  Final   Special Requests   Final    BOTTLES DRAWN AEROBIC AND ANAEROBIC Blood Culture adequate volume   Culture   Final    NO GROWTH 2 DAYS Performed at [facility], [address], [place], [place] [address]    Report Status PENDING  Incomplete  Culture, blood (routine x 2)     Status: None (Preliminary result)   Collection Time: 02/18/20 11:06 PM   Specimen: BLOOD  Result Value Ref Range Status   Specimen Description BLOOD RIGHT  ANTECUBITAL  Final   Special Requests   Final    BOTTLES DRAWN AEROBIC AND ANAEROBIC Blood Culture adequate volume   Culture   Final    NO GROWTH 2 DAYS Performed at [facility], [address] [place], [place] [address]    Report Status  PENDING  Incomplete    Radiology Reports CT Head Wo Contrast  Result Date: 02/18/2020 CLINICAL DATA:  Ataxia, stroke suspected. Additional history provided: Altered mental status, last known well yesterday, left arm more flaccid than normal, history of CHF, diabetes mellitus, hypertension, schizophrenia. EXAM: CT HEAD WITHOUT CONTRAST TECHNIQUE: Contiguous axial images were obtained from the base of the skull through the vertex without intravenous contrast. COMPARISON:  Brain MRI 04/27/2019. FINDINGS: Brain: Mildly motion degraded examination. Stable mild-to-moderate generalized parenchymal atrophy. There is moderate ill-defined hypoattenuation within the cerebral white matter which is nonspecific, but consistent with chronic small vessel ischemic disease. Findings are similar as compared to prior head CT 04/26/2019. There is no acute intracranial hemorrhage. No demarcated cortical infarct. No extra-axial fluid collection. No evidence of intracranial mass. No midline shift. Vascular: No hyperdense vessel.  Atherosclerotic calcifications. Skull: Normal. Negative for fracture or focal lesion. Sinuses/Orbits: Visualized orbits show no acute finding. No significant paranasal sinus disease or mastoid effusion at the imaged levels. IMPRESSION: Mildly motion degraded examination. No evidence of acute intracranial abnormality. Stable generalized parenchymal atrophy and chronic small vessel ischemic disease. Electronically Signed   By: Kellie Simmering DO   On: 02/18/2020 18:57   DG CHEST PORT 1 VIEW  Result Date: 02/20/2020 CLINICAL DATA:  Dyspnea. EXAM: PORTABLE CHEST 1 VIEW COMPARISON:  February 18, 2020 FINDINGS: Mild diffuse chronic appearing increased lung markings are seen  without evidence of acute infiltrate, pleural effusion or pneumothorax. The heart size and mediastinal contours are within normal limits. There is moderate severity calcification of the aortic arch. A chronic lateral seventh right rib deformity is seen. The visualized skeletal structures are otherwise unremarkable. IMPRESSION: Chronic appearing increased lung markings without evidence of acute or active cardiopulmonary disease. Electronically Signed   By: Virgina Norfolk M.D.   On: 02/20/2020 20:39   DG Chest Port 1 View  Result Date: 02/18/2020 CLINICAL DATA:  Altered mental status EXAM: PORTABLE CHEST 1 VIEW COMPARISON:  04/26/2019 FINDINGS: Heart is borderline in size. Right basilar airspace opacities could reflect atelectasis or infiltrates. Left lung clear. No effusions or acute bony abnormality. IMPRESSION: Right base atelectasis or infiltrate. Electronically Signed   By: Rolm Baptise M.D.   On: 02/18/2020 17:34     CBC Recent Labs  Lab 02/18/20 1803 02/19/20 0400 02/21/20 0504  WBC 13.6* 13.4* 8.8  HGB 11.3* 10.4* 10.6*  HCT 38.7* 35.7* 36.4*  PLT 315 283 264  MCV 78.8* 79.0* 79.5*  MCH 23.0* 23.0* 23.1*  MCHC 29.2* 29.1* 29.1*  RDW 14.6 14.6 15.2  LYMPHSABS 0.9  --   --   MONOABS 0.2  --   --   EOSABS 0.0  --   --   BASOSABS 0.0  --   --     Chemistries  Recent Labs  Lab 02/18/20 1803 02/19/20 0400 02/21/20 0504  NA 128* 134* 138  K 4.3 3.8 4.3  CL 91* 97* 102  CO2 '26 28 29  '$ GLUCOSE 189* 188* 225*  BUN '15 12 8  '$ CREATININE 1.00 1.00 0.81  CALCIUM 8.8* 8.6* 9.2  MG 2.0  --   --   AST 14*  --   --   ALT 14  --   --   ALKPHOS 104  --   --   BILITOT 0.6  --   --    ------------------------------------------------------------------------------------------------------------------ No results for input(s): CHOL, HDL, LDLCALC, TRIG, CHOLHDL, LDLDIRECT in the last 72 hours.  Lab Results  Component Value Date  HGBA1C 11.4 (H) 11/22/2018    ------------------------------------------------------------------------------------------------------------------ No results for input(s): TSH, T4TOTAL, T3FREE, THYROIDAB in the last 72 hours.  Invalid input(s): FREET3 ------------------------------------------------------------------------------------------------------------------ No results for input(s): VITAMINB12, FOLATE, FERRITIN, TIBC, IRON, RETICCTPCT in the last 72 hours.  Coagulation profile No results for input(s): INR, PROTIME in the last 168 hours.  No results for input(s): DDIMER in the last 72 hours.  Cardiac Enzymes No results for input(s): CKMB, TROPONINI, MYOGLOBIN in the last 168 hours.  Invalid input(s): CK ------------------------------------------------------------------------------------------------------------------    Component Value Date/Time   BNP 29.0 11/21/2018 1601    Derika Eckles M.D on 02/21/2020 at 3:24 PM  Go to [email or web address] - for contact info  [facility] - Office  [phone]

## 2020-02-21 NOTE — Progress Notes (Addendum)
0615 Notified by sitter that she was on opposite side of bed, when pt pulled IV pole onto his forehead. 1 cm laceration noted above right brow with small amount of blood and small raised knot. Cleansed and applied band-aid. Pt alert and continues to be confused, states "That hurt, I won't do that again." Notified supervisor Barnett Applebaum, MD Dr. Humphrey Rolls and left a message with wife Mckinnon Glick to return call since she did not answer either home or mobile. No new orders given by Dr. Humphrey Rolls. Wound record recorded and safety zone submitted. Will continue to monitor.

## 2020-02-22 LAB — GLUCOSE, CAPILLARY
Glucose-Capillary: 237 mg/dL — ABNORMAL HIGH (ref 70–99)
Glucose-Capillary: 325 mg/dL — ABNORMAL HIGH (ref 70–99)
Glucose-Capillary: 210 mg/dL — ABNORMAL HIGH (ref 70–99)
Glucose-Capillary: 172 mg/dL — ABNORMAL HIGH (ref 70–99)

## 2020-02-22 LAB — FOLATE: Folate: 17.1 ng/mL (ref >5.9)

## 2020-02-22 LAB — IRON AND TIBC
Iron: 31 ug/dL — ABNORMAL LOW (ref 45–182)
Saturation Ratios: 8 % — ABNORMAL LOW (ref 17.9–39.5)
TIBC: 370 ug/dL (ref 250–450)
UIBC: 339 ug/dL

## 2020-02-22 LAB — VITAMIN B12: Vitamin B-12: 586 pg/mL (ref 180–914)

## 2020-02-22 LAB — FERRITIN: Ferritin: 10 ng/mL — ABNORMAL LOW (ref 24–336)

## 2020-02-22 LAB — OCCULT BLOOD X 1 CARD TO LAB, STOOL: Fecal Occult Bld: NEGATIVE

## 2020-02-22 MED ORDER — MUCINEX 600 MG PO TB12
600.0000 mg | ORAL_TABLET | Freq: Two times a day (BID) | ORAL | 0 refills | Status: AC
Start: 2020-02-22 — End: 2020-03-03

## 2020-02-22 MED ORDER — ALBUTEROL SULFATE (2.5 MG/3ML) 0.083% IN NEBU: 2.5000 mg | INHALATION_SOLUTION | Freq: Four times a day (QID) | RESPIRATORY_TRACT | 0 refills | Status: AC | PRN

## 2020-02-22 MED ORDER — TIOTROPIUM BROMIDE MONOHYDRATE 18 MCG IN CAPS: 18.0000 ug | ORAL_CAPSULE | Freq: Every day | RESPIRATORY_TRACT | 12 refills | Status: AC

## 2020-02-22 MED ORDER — BISACODYL 10 MG RE SUPP
10.0000 mg | Freq: Once | RECTAL | Status: AC
  Administered 2020-02-22: 10 mg via RECTAL
  Filled 2020-02-22: qty 1

## 2020-02-22 MED ORDER — INSULIN LISPRO 100 UNIT/ML ~~LOC~~ SOLN: 0.0000 [IU] | SUBCUTANEOUS | 11 refills | Status: DC

## 2020-02-22 MED ORDER — POLYETHYLENE GLYCOL 3350 17 G PO PACK: 17.0000 g | PACK | Freq: Every day | ORAL | 0 refills | Status: DC

## 2020-02-22 MED ORDER — METOPROLOL TARTRATE 50 MG PO TABS: 50.0000 mg | ORAL_TABLET | Freq: Two times a day (BID) | ORAL | 2 refills | Status: DC

## 2020-02-22 MED ORDER — TRAMADOL HCL 50 MG PO TABS
100.0000 mg | ORAL_TABLET | Freq: Once | ORAL | Status: AC
  Administered 2020-02-22: 100 mg via ORAL
  Filled 2020-02-22: qty 2

## 2020-02-22 MED ORDER — QUETIAPINE FUMARATE 25 MG PO TABS: 25.0000 mg | ORAL_TABLET | Freq: Every morning | ORAL | 0 refills | Status: DC

## 2020-02-22 MED ORDER — BLOOD GLUCOSE METER KIT: PACK | 3 refills | Status: DC

## 2020-02-22 MED ORDER — DOXYCYCLINE HYCLATE 100 MG PO TABS: 100.0000 mg | ORAL_TABLET | Freq: Two times a day (BID) | ORAL | 0 refills | Status: AC

## 2020-02-22 MED ORDER — PREDNISONE 20 MG PO TABS: 40.0000 mg | ORAL_TABLET | Freq: Every day | ORAL | 0 refills | Status: DC

## 2020-02-22 MED ORDER — APIXABAN 5 MG PO TABS: 5.0000 mg | ORAL_TABLET | Freq: Two times a day (BID) | ORAL | 1 refills | Status: AC

## 2020-02-22 MED ORDER — TRELEGY ELLIPTA 100-62.5-25 MCG/INH IN AEPB: 1.0000 | INHALATION_SPRAY | Freq: Every day | RESPIRATORY_TRACT | 5 refills | Status: AC

## 2020-02-22 MED ORDER — FUROSEMIDE 20 MG PO TABS: 40.0000 mg | ORAL_TABLET | Freq: Every day | ORAL | 2 refills | Status: DC

## 2020-02-22 MED ORDER — HYDROXYZINE HCL 25 MG PO TABS: 25.0000 mg | ORAL_TABLET | Freq: Two times a day (BID) | ORAL | 0 refills | Status: AC | PRN

## 2020-02-22 MED ORDER — POLYETHYLENE GLYCOL 3350 17 G PO PACK
17.0000 g | PACK | Freq: Every day | ORAL | Status: DC
  Administered 2020-02-22: 17 g via ORAL
  Filled 2020-02-22: qty 1

## 2020-02-22 MED ORDER — QUETIAPINE FUMARATE 50 MG PO TABS: 50.0000 mg | ORAL_TABLET | Freq: Every day | ORAL | 0 refills | Status: DC

## 2020-02-22 MED ORDER — LANTUS SOLOSTAR 100 UNIT/ML ~~LOC~~ SOPN: 25.0000 [IU] | PEN_INJECTOR | Freq: Two times a day (BID) | SUBCUTANEOUS | 3 refills | Status: DC

## 2020-02-22 MED ORDER — ACETAMINOPHEN 325 MG PO TABS: 650.0000 mg | ORAL_TABLET | Freq: Four times a day (QID) | ORAL | 2 refills | Status: AC | PRN

## 2020-02-22 MED ORDER — DULOXETINE HCL 30 MG PO CPEP: 30.0000 mg | ORAL_CAPSULE | Freq: Every morning | ORAL | 3 refills | Status: AC

## 2020-02-22 MED ORDER — BLOOD GLUCOSE METER KIT: PACK | 1 refills | Status: DC

## 2020-02-22 MED ORDER — PANTOPRAZOLE SODIUM 40 MG PO TBEC: 40.0000 mg | DELAYED_RELEASE_TABLET | Freq: Two times a day (BID) | ORAL | 2 refills | Status: DC

## 2020-02-22 NOTE — Discharge Summary (Signed)
BOLDEN HAGERMAN, is a 74 y.o. male  DOB 1946-05-14  MRN [id number].  Admission date:  02/18/2020  Admitting Physician  Bethena Roys, MD  Discharge Date:  02/22/2020   Primary MD  [name], MD  Recommendations for primary care physician for things to follow:    1) please take your medications including Seroquel and insulin as prescribed 2) follow-up with your primary care physician for further adjustments of  medications and for repeat CBC blood work 3) Home health oxygen and CPAP machine use advised 4) you will need outpatient follow-up with pulmonologist within the next week or 2 5) you are taking Eliquis/apixaban which is a blood thinner so please Avoid ibuprofen/Advil/Aleve/Motrin/Goody Powders/Naproxen/BC powders/Meloxicam/Diclofenac/Indomethacin and other Nonsteroidal anti-inflammatory medications as these will make you more likely to bleed and can cause stomach ulcers, can also cause Kidney problems.  6) you will need to follow-up with gastroenterologist as outpatient for further work-up of his anemia he probably need upper endoscopy and may be a colonoscopy  Admission Diagnosis  Confusion [R44.3] Acute metabolic encephalopathy [X54.00]   Discharge Diagnosis  Confusion [Q67.6] Acute metabolic encephalopathy [P95.09]    Principal Problem:   Acute metabolic encephalopathy Active Problems:   Obstructive sleep apnea   Essential hypertension   Paranoid schizophrenia ([place])   CAD (coronary artery disease)   Type 2 diabetes mellitus without complication, with long-term current use of insulin (HCC)   Chronic diastolic CHF (congestive heart failure) (HCC)   OSA (obstructive sleep apnea)   Paroxysmal atrial fibrillation ([place])      Past Medical History:  Diagnosis Date  . Arthritis   . 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 CHF (congestive heart failure) (HCC)    diastolic   . Chronic  pain   . COPD (chronic obstructive pulmonary disease) ([place])   . Depression   . Diabetes mellitus without complication (HCC)    insulin pump  . Hypercholesterolemia   . Hypertension   . On home O2    3 liters  . Oxygen deficiency   . Schizophrenia ([place])   . Sleep apnea    noncompliant with BiPAP  . Sleep apnea   . Tobacco abuse     Past Surgical History:  Procedure Laterality Date  . COLON SURGERY  March 2010   secondary to large colon polyp, final path per discharge summary notes tubulovillous adenoma.   . COLONOSCOPY  July 2011   Dr. Benson Norway: multiple polyps, internal and external hemorrhoids, diverticulosis. tubular adenoma  . ESOPHAGOGASTRODUODENOSCOPY (EGD) WITH PROPOFOL N/A 04/20/2013   Procedure: ESOPHAGOGASTRODUODENOSCOPY (EGD) WITH PROPOFOL;  Surgeon: Daneil Dolin, MD;  Location: [facility];  Service: Endoscopy;  Laterality: N/A;  . KNEE SURGERY     X2       HPI  from the history and physical done on the day of admission:    Chief Complaint: Altered mental status  HPI: DALTEN AMBROSINO is a 74 y.o. male with medical history significant for schizophrenia, diabetes mellitus, diastolic CHF,  paroxysmal atrial fibrillation, chronic respiratory failure, OSA and obesity hypoventilation syndrome, COPD on 3 L O2. Patient was brought to the ED with reports of altered mental status, patient has been talking out of his head and confused over the last week but this has worsened over the past 2 days. Triage notes also report left arm been more flaccid.  At the time of my evaluation, patient is status post '1mg'$  ativan given as patient was agitated, so that blood work and imaging could be done.  Patient is sedated, and unable to give me history. I talked to patient spouse Bradd, [place] on the phone, she reports that patient stopped taking all his medications 2 weeks ago.  Patient has been hollering, irritable all night.  And later today patient appears lethargic.  She checked his blood sugars  and they were in the 500s, patient gave himself 30 units of insulin.  She denies noticing any weakness of his extremities.  She reports increased chest congestion over the past few days, but denies worsening cough or difficulty breathing.  No Fevers no, no chills.  She reports that due to his multiple medical problems, it is challenging taking care of him.  ED Course: Tachycardic to 103, temperature 98.5, blood pressure 130s to 150s O2 sats greater than 97% on room air, but later patient was placed on 2 L O2 as sats on room air was 88%.  WBC 13.6.  Mild lactic acidosis 2.2.  pH 7.4, PCO2 of 40.9, PO2 of 78.  Creatinine at baseline 1.  Sodium mildly low 128.  Early motion degraded but without acute intracranial abnormality.  Right base atelectasis or infiltrate.  IV Levaquin given, 1 L bolus.  Hospitalist to admit for altered mental status.  Review of Systems: Patient sedated unable to answer questions.    Hospital Course:     Brief Summary:- 74 y.o.malewith medical history significant forschizophrenia, diabetes mellitus, diastolic CHF, paroxysmal atrial fibrillation, chronic respiratory failure, OSAand obesity hypoventilation syndrome, COPD on 3 L O2--admitted on 02/18/2020 with acute metabolic encephalopathy most likely due to pneumonia in the setting of underlying schizophrenia -More cooperative, more alert -In retrospect patient's behavioral problems and metabolic encephalopathy was most likely due to lack of compliance with his psychiatric medications  A/p 1) acute metabolic encephalopathy -Patient with longstanding history of mental illness including schizophrenia - per wife was not compliant with anti-psychotics for at least 1 month PTA -Mentation has improved significantly on Seroquel -Off restraints -EKG on 02/21/2020 with QTC less than 450 --In retrospect patient's behavioral problems and metabolic encephalopathy was most likely due to lack of compliance with his psychiatric  medications  2) morbid obesity/OSA/hypoventilation syndrome---  use CPAP nightly -Follow-up with Dr. Kara Mead the pulmonologist as outpatient for further evaluation and CPAP titration  3)Schizophrenia--with behavioral disturbance --  -Please see management as above #1  4) suspected sepsis secondary to Pneumonia--- on admission patient met sepsis criteria, treat as above in #1 -Lactic acid normalized after IV fluids dropping from 2.2 to 1.2 -WBC is down to 8.8 from peak of 13.6 on admission -Blood cultures from 02/18/2020 NGTD -Repeat chest x-ray on 02/20/2020 without pneumonia Rocephin discontinued Okay to use doxycycline  p.o. for COPD exacerbation -Sepsis was suspected on admission however sepsis ruled out at this time  5)DM2-restart Lantus and Humalog sliding scale   6)HFpEF--last known EF 60 to 65% patient with history of grade 2 diastolic dysfunction -Continue Lasix -Currently appears euvolemic without CHF exacerbation  7)Hyponatremia--- sodium up to 138 from  128 with gentle IV normal saline hydration -Change IV fluids to KVO  8)PAFib--- h/o occular CVA, c/n Eliquis  9) acute on chronic anemia--hemoglobin down to 10.6 with hydration, on admission hemoglobin was 11.3, baseline hemoglobin between 11 and 12 usually -Borderline low MCV and low MCHC with normal RDW noted -Suspect some component of hemodilution -No evidence of GI bleed or other bleeding at this time however patient is on Eliquis -Work-up consistent with iron deficiency anemia with low serum iron and low ferritin -Outpatient work-up by GI for possible EGD and colonoscopy advised  10) generalized weakness and deconditioning--- home health physical therapy advised  Disposition--- discharge home with home health PT  Dispo: The patient is from: Home  Anticipated d/c is to: Home with home health PT Code Status : full  Family Communication:   Spouse-Discussed with wife at  [phone] Consults  :  na   Discharge Condition: Stable  Follow UP  Follow-up Information    [name], MD. Schedule an appointment as soon as possible for a visit in 1 week(s).   Specialty: Internal Medicine Contact information: [address] [place] [address] [phone]        Rigoberto Noel, MD. Schedule an appointment as soon as possible for a visit in 2 week(s).   Specialty: Pulmonary Disease Why: COPD and sleep apnea may need CPAP adjustment Contact information: [address] [place] [address] [phone]        Rogene Houston, MD. Schedule an appointment as soon as possible for a visit in 2 week(s).   Specialty: Gastroenterology Why: EGD and colonoscopy due to iron deficiency anemia while on Eliquis Contact information: [address], [address] [place] [address] [phone]           Diet and Activity recommendation:  As advised  Discharge Instructions    Discharge Instructions    Call MD for:  difficulty breathing, headache or visual disturbances   Complete by: As directed    Call MD for:  persistant dizziness or light-headedness   Complete by: As directed    Call MD for:  persistant nausea and vomiting   Complete by: As directed    Call MD for:  severe uncontrolled pain   Complete by: As directed    Call MD for:  temperature >100.4   Complete by: As directed    Diet - low sodium heart healthy   Complete by: As directed    Discharge instructions   Complete by: As directed    1) please take your medications including Seroquel and insulin as prescribed 2) follow-up with your primary care physician for further adjustments of  medications and for repeat CBC blood work 3) Home health oxygen and CPAP machine use advised 4) you will need outpatient follow-up with pulmonologist within the next week or 2 5) you are taking Eliquis/apixaban which is a blood thinner so please Avoid  ibuprofen/Advil/Aleve/Motrin/Goody Powders/Naproxen/BC powders/Meloxicam/Diclofenac/Indomethacin and other Nonsteroidal anti-inflammatory medications as these will make you more likely to bleed and can cause stomach ulcers, can also cause Kidney problems.  6) you will need to follow-up with gastroenterologist as outpatient for further work-up of his anemia he probably need upper endoscopy and may be a colonoscopy   Increase activity slowly   Complete by: As directed         Discharge Medications     Allergies as of 02/22/2020      Reactions   Phenergan [promethazine Hcl] Other (  See Comments)   Becomes very confused and aggressive   Haldol [haloperidol Lactate] Other (See Comments)   Shaking    Metformin And Related Diarrhea      Medication List    STOP taking these medications   traZODone 100 MG tablet Commonly known as: DESYREL     TAKE these medications   acetaminophen 325 MG tablet Commonly known as: TYLENOL Take 2 tablets (650 mg total) by mouth every 6 (six) hours as needed for mild pain, fever or headache (or Fever >/= 101).   albuterol (2.5 MG/3ML) 0.083% nebulizer solution Commonly known as: PROVENTIL Take 3 mLs (2.5 mg total) by nebulization every 6 (six) hours as needed for wheezing or shortness of breath.   apixaban 5 MG Tabs tablet Commonly known as: ELIQUIS Take 1 tablet (5 mg total) by mouth 2 (two) times daily.   carboxymethylcellulose 0.5 % Soln Commonly known as: REFRESH PLUS Apply 1 drop to eye 3 (three) times daily as needed.   CENTRUM SILVER 50+MEN PO Take 1 tablet by mouth daily.   PRESERVISION AREDS 2+MULTI VIT PO Take 2 tablets by mouth in the morning and at bedtime.   cholecalciferol 1000 units tablet Commonly known as: VITAMIN D Take 1,000 Units by mouth 2 (two) times daily.   Cinnamon 500 MG capsule Take 1,000 mg by mouth in the morning and at bedtime.   doxycycline 100 MG tablet Commonly known as: VIBRA-TABS Take 1 tablet (100 mg  total) by mouth 2 (two) times daily for 5 days.   DULoxetine 30 MG capsule Commonly known as: CYMBALTA Take 1 capsule (30 mg total) by mouth every morning.   furosemide 20 MG tablet Commonly known as: LASIX Take 2 tablets (40 mg total) by mouth daily.   gabapentin 300 MG capsule Commonly known as: NEURONTIN Take 1 capsule (300 mg total) by mouth 3 (three) times daily. What changed: when to take this   hydrOXYzine 25 MG tablet Commonly known as: ATARAX/VISTARIL Take 1 tablet (25 mg total) by mouth every 12 (twelve) hours as needed for anxiety or itching (Anxiety and insomnia or restlessness). What changed: reasons to take this   insulin lispro 100 UNIT/ML injection Commonly known as: HumaLOG Inject 0-0.1 mLs (0-10 Units total) into the skin See admin instructions. Per sliding scale insulin Lispro (Humalog) injection 0-10 Units 0-10 Units Subcutaneous, 3 times daily with meals CBG 70 - 120: 0 unit CBG 121 - 150: 0 unit  CBG 151 - 200: 1 unit CBG 201 - 250: 2 units CBG 251 - 300: 4 units CBG 301 - 350: 6 units  CBG 351 - 400: 8 units  CBG > 400: 10 units What changed:   how much to take  when to take this  additional instructions   Lantus SoloStar 100 UNIT/ML Solostar Pen Generic drug: insulin glargine Inject 25 Units into the skin 2 (two) times daily. What changed:   how much to take  additional instructions   meclizine 25 MG tablet Commonly known as: ANTIVERT Take 25 mg by mouth 3 (three) times daily as needed for dizziness.   melatonin 3 MG Tabs tablet Take 6 mg by mouth at bedtime.   metoprolol tartrate 50 MG tablet Commonly known as: LOPRESSOR Take 1 tablet (50 mg total) by mouth 2 (two) times daily.   Mucinex 600 MG 12 hr tablet Generic drug: guaiFENesin Take 1 tablet (600 mg total) by mouth 2 (two) times daily for 10 days.   OXYGEN Inhale 2 L into  the lungs continuous.   pantoprazole 40 MG tablet Commonly known as: PROTONIX Take 1 tablet (40 mg total)  by mouth 2 (two) times daily.   polyethylene glycol 17 g packet Commonly known as: MIRALAX / GLYCOLAX Take 17 g by mouth daily. Start taking on: February 23, 2020   predniSONE 20 MG tablet Commonly known as: Deltasone Take 2 tablets (40 mg total) by mouth daily with breakfast. What changed:   medication strength  how much to take  when to take this   QUEtiapine 25 MG tablet Commonly known as: SEROQUEL Take 1 tablet (25 mg total) by mouth in the morning.   QUEtiapine 50 MG tablet Commonly known as: SEROQUEL Take 1 tablet (50 mg total) by mouth at bedtime.   saw palmetto 160 MG capsule Take 160 mg by mouth 2 (two) times daily.   simvastatin 40 MG tablet Commonly known as: ZOCOR Take 40 mg by mouth every evening.   tiotropium 18 MCG inhalation capsule Commonly known as: SPIRIVA Place 1 capsule (18 mcg total) into inhaler and inhale daily. What changed:   how much to take  when to take this   traMADol 50 MG tablet Commonly known as: ULTRAM Take 100 mg by mouth 3 (three) times daily as needed for moderate pain.   Trelegy Ellipta 100-62.5-25 MCG/INH Aepb Generic drug: Fluticasone-Umeclidin-Vilant Inhale 1 puff into the lungs daily. What changed: additional instructions   triamcinolone cream 0.1 % Commonly known as: KENALOG Apply 1 application topically 2 (two) times daily.   triamcinolone cream 0.1 % Commonly known as: KENALOG Apply 1 application topically 2 (two) times daily.       Major procedures and Radiology Reports - PLEASE review detailed and final reports for all details, in brief -    CT Head Wo Contrast  Result Date: 02/18/2020 CLINICAL DATA:  Ataxia, stroke suspected. Additional history provided: Altered mental status, last known well yesterday, left arm more flaccid than normal, history of CHF, diabetes mellitus, hypertension, schizophrenia. EXAM: CT HEAD WITHOUT CONTRAST TECHNIQUE: Contiguous axial images were obtained from the base of the skull  through the vertex without intravenous contrast. COMPARISON:  Brain MRI 04/27/2019. FINDINGS: Brain: Mildly motion degraded examination. Stable mild-to-moderate generalized parenchymal atrophy. There is moderate ill-defined hypoattenuation within the cerebral white matter which is nonspecific, but consistent with chronic small vessel ischemic disease. Findings are similar as compared to prior head CT 04/26/2019. There is no acute intracranial hemorrhage. No demarcated cortical infarct. No extra-axial fluid collection. No evidence of intracranial mass. No midline shift. Vascular: No hyperdense vessel.  Atherosclerotic calcifications. Skull: Normal. Negative for fracture or focal lesion. Sinuses/Orbits: Visualized orbits show no acute finding. No significant paranasal sinus disease or mastoid effusion at the imaged levels. IMPRESSION: Mildly motion degraded examination. No evidence of acute intracranial abnormality. Stable generalized parenchymal atrophy and chronic small vessel ischemic disease. Electronically Signed   By: Kellie Simmering DO   On: 02/18/2020 18:57   DG CHEST PORT 1 VIEW  Result Date: 02/20/2020 CLINICAL DATA:  Dyspnea. EXAM: PORTABLE CHEST 1 VIEW COMPARISON:  February 18, 2020 FINDINGS: Mild diffuse chronic appearing increased lung markings are seen without evidence of acute infiltrate, pleural effusion or pneumothorax. The heart size and mediastinal contours are within normal limits. There is moderate severity calcification of the aortic arch. A chronic lateral seventh right rib deformity is seen. The visualized skeletal structures are otherwise unremarkable. IMPRESSION: Chronic appearing increased lung markings without evidence of acute or active cardiopulmonary disease. Electronically Signed  By: Virgina Norfolk M.D.   On: 02/20/2020 20:39   DG Chest Port 1 View  Result Date: 02/18/2020 CLINICAL DATA:  Altered mental status EXAM: PORTABLE CHEST 1 VIEW COMPARISON:  04/26/2019 FINDINGS: Heart is  borderline in size. Right basilar airspace opacities could reflect atelectasis or infiltrates. Left lung clear. No effusions or acute bony abnormality. IMPRESSION: Right base atelectasis or infiltrate. Electronically Signed   By: Rolm Baptise M.D.   On: 02/18/2020 17:34    Micro Results   Recent Results (from the past 240 hour(s))  SARS Coronavirus 2 by RT PCR (hospital order, performed in [facility] hospital lab) Nasopharyngeal Nasopharyngeal Swab     Status: None   Collection Time: 02/18/20  9:00 PM   Specimen: Nasopharyngeal Swab  Result Value Ref Range Status   SARS Coronavirus 2 NEGATIVE NEGATIVE Final    Comment: (NOTE) SARS-CoV-2 target nucleic acids are NOT DETECTED. The SARS-CoV-2 RNA is generally detectable in upper and lower respiratory specimens during the acute phase of infection. The lowest concentration of SARS-CoV-2 viral copies this assay can detect is 250 copies / mL. A negative result does not preclude SARS-CoV-2 infection and should not be used as the sole basis for treatment or other patient management decisions.  A negative result may occur with improper specimen collection / handling, submission of specimen other than nasopharyngeal swab, presence of viral mutation(s) within the areas targeted by this assay, and inadequate number of viral copies (<250 copies / mL). A negative result must be combined with clinical observations, patient history, and epidemiological information. Fact Sheet for Patients:   StrictlyIdeas.no Fact Sheet for Healthcare Providers: BankingDealers.co.za This test is not yet approved or cleared  by the [place] FDA and has been authorized for detection and/or diagnosis of SARS-CoV-2 by FDA under an Emergency Use Authorization (EUA).  This EUA will remain in effect (meaning this test can be used) for the duration of the COVID-19 declaration under Section 564(b)(1) of the Act, 21  U.S.C. section 360bbb-3(b)(1), unless the authorization is terminated or revoked sooner. Performed at [facility], [address], [place], [place] [address]   Culture, blood (routine x 2)     Status: None (Preliminary result)   Collection Time: 02/18/20 11:00 PM   Specimen: BLOOD  Result Value Ref Range Status   Specimen Description BLOOD RIGHT ANTECUBITAL  Final   Special Requests   Final    BOTTLES DRAWN AEROBIC AND ANAEROBIC Blood Culture adequate volume   Culture   Final    NO GROWTH 4 DAYS Performed at [facility], [address], [place], [place] [address]    Report Status PENDING  Incomplete  Culture, blood (routine x 2)     Status: None (Preliminary result)   Collection Time: 02/18/20 11:06 PM   Specimen: BLOOD  Result Value Ref Range Status   Specimen Description BLOOD RIGHT ANTECUBITAL  Final   Special Requests   Final    BOTTLES DRAWN AEROBIC AND ANAEROBIC Blood Culture adequate volume   Culture   Final    NO GROWTH 4 DAYS Performed at [facility], [address] [place], [place] [address]    Report Status PENDING  Incomplete       Today   Subjective    Ovide Dusek today has no new complaints -Very cooperative and interactive -Ambulated with physical therapist --In retrospect patient's behavioral problems and metabolic encephalopathy was most likely due to lack of compliance with his psychiatric medications  Patient has been seen and examined prior to discharge   Objective   Blood pressure 136/71, pulse 81, temperature 97.6 F (36.4 C), temperature source Oral, resp. rate 18, SpO2 93 %.   Intake/Output Summary (Last 24 hours) at 02/22/2020 1132 Last data filed at 02/22/2020 1011 Gross per 24 hour  Intake 1320 ml  Output 1700 ml  Net -380 ml   Exam Gen:- Awake Alert, no acute distress  HEENT:- [place].AT, No sclera icterus Neck-Supple Neck,No JVD,.  Lungs-  CTAB , good air movement bilaterally  CV- S1, S2 normal, regular Abd-  +ve B.Sounds,  Abd Soft, No tenderness,    Extremity/Skin:- No  edema,   good pulses Psych-affect is appropriate, oriented x3 Neuro-generalized weakness, no new focal deficits, no tremors    Data Review   CBC w Diff:  Lab Results  Component Value Date   WBC 8.8 02/21/2020   HGB 10.6 (L) 02/21/2020   HCT 36.4 (L) 02/21/2020   PLT 264 02/21/2020   LYMPHOPCT 6 02/18/2020   MONOPCT 2 02/18/2020   EOSPCT 0 02/18/2020   BASOPCT 0 02/18/2020    CMP:  Lab Results  Component Value Date   NA 138 02/21/2020   K 4.3 02/21/2020   CL 102 02/21/2020   CO2 29 02/21/2020   BUN 8 02/21/2020   CREATININE 0.81 02/21/2020   PROT 6.9 02/18/2020   ALBUMIN 3.8 02/18/2020   BILITOT 0.6 02/18/2020   ALKPHOS 104 02/18/2020   AST 14 (L) 02/18/2020   ALT 14 02/18/2020  .   Total Discharge time is about 33 minutes  Roxan Hockey M.D on 02/22/2020 at 11:32 AM  Go to [email or web address] -  for contact info  [facility] - Office  [phone]

## 2020-02-22 NOTE — Evaluation (Signed)
Physical Therapy Evaluation Patient Details Name: Rick Cruz MRN: [id number] DOB: 08/10/1946 Today's Date: 02/22/2020   History of Present Illness  74 y.o. male with medical history significant for schizophrenia, diabetes mellitus, diastolic CHF, paroxysmal atrial fibrillation, chronic respiratory failure, OSA and obesity hypoventilation syndrome, COPD on 3 L O2--admitted on 02/18/2020 with acute metabolic encephalopathy most likely due to pneumonia in the setting of underlying schizophrenia-More cooperative, more alert.   Clinical Impression  Patient evaluated by Physical Therapy with no further acute PT needs identified. All education has been completed and the patient has no further questions. Patient agreeable to PT evaluation today. Patient performed well. He did exhibit weakness and decreased activity tolerance as well as decreased knowledge on DME use which increases his risk for falls. Patient's report of new [name], [place] home is accessible to him. PT did encourage patient to continue with his medicines to keep his mind clear to increase his safety in the home. See below for any follow-up Physical Therapy or equipment needs. PT is signing off. Thank you for this referral.     Follow Up Recommendations Home health PT;Supervision - Intermittent;Supervision for mobility/OOB    Equipment Recommendations  None recommended by PT    Recommendations for Other Services       Precautions / Restrictions Precautions Precautions: Fall Restrictions Weight Bearing Restrictions: No      Mobility  Bed Mobility Overal bed mobility: Modified Independent             General bed mobility comments: increased time  Transfers Overall transfer level: Needs assistance Equipment used: Rolling walker (2 wheeled) Transfers: Sit to/from [facility] Sit to Stand: Supervision Stand pivot transfers: Supervision       General transfer comment: verbal cues for sequencing of  steps and placement of hands  Ambulation/Gait Ambulation/Gait assistance: Supervision Gait Distance (Feet): 100 Feet Assistive device: Rolling walker (2 wheeled) Gait Pattern/deviations: Step-through pattern;Decreased step length - right;Decreased step length - left;Decreased stride length;Trunk flexed;Wide base of support Gait velocity: decreased   General Gait Details: slow, somewhat labored gait; limited by fatigue and complaints of "achy" right shoulder; steady pace; no complaints of SOB on 3 LPM Oxygen  Stairs            Wheelchair Mobility    Modified Rankin (Stroke Patients Only)       Balance Overall balance assessment: Mild deficits observed, not formally tested                                           Pertinent Vitals/Pain Pain Assessment: No/denies pain    Home Living Family/patient expects to be discharged to:: Private residence Living Arrangements: Spouse/significant other Available Help at Discharge: Family;Available PRN/intermittently Type of Home: House Home Access: Level entry     Home Layout: One level Home Equipment: Hand held shower head;Grab bars - tub/shower;Grab bars - toilet;Walker - 4 wheels;Walker - 2 wheels;Shower seat      Prior Function Level of Independence: Needs assistance   Gait / Transfers Assistance Needed: usually walked household distances with 4 wheeled walker  ADL's / Homemaking Assistance Needed: Independent for BADLs; assistance for IADLs  Comments: ddriver's license has been expired since 10/2019     Hand Dominance   Dominant Hand: Right    Extremity/Trunk Assessment   Upper Extremity Assessment Upper Extremity Assessment: Generalized weakness    Lower Extremity Assessment  Lower Extremity Assessment: Generalized weakness    Cervical / Trunk Assessment Cervical / Trunk Assessment: Normal  Communication   Communication: No difficulties  Cognition Arousal/Alertness: Awake/alert Behavior  During Therapy: WFL for tasks assessed/performed Overall Cognitive Status: Within Functional Limits for tasks assessed                                        General Comments      Exercises     Assessment/Plan    PT Assessment All further PT needs can be met in the next venue of care  PT Problem List Decreased strength;Decreased mobility;Decreased safety awareness;Decreased activity tolerance;Decreased cognition;Decreased balance;Decreased knowledge of use of DME       PT Treatment Interventions      PT Goals (Current goals can be found in the Care Plan section)  Acute Rehab PT Goals Patient Stated Goal: Go home to new house in [place], [place]. PT Goal Formulation: With patient Time For Goal Achievement: 03/07/20 Potential to Achieve Goals: Good    Frequency     Barriers to discharge        Co-evaluation               AM-PAC PT "6 Clicks" Mobility  Outcome Measure Help needed turning from your back to your side while in a flat bed without using bedrails?: None Help needed moving from lying on your back to sitting on the side of a flat bed without using bedrails?: A Little Help needed moving to and from a bed to a chair (including a wheelchair)?: A Little Help needed standing up from a chair using your arms (e.g., wheelchair or bedside chair)?: A Little Help needed to walk in hospital room?: A Little Help needed climbing 3-5 steps with a railing? : A Little 6 Click Score: 19    End of Session Equipment Utilized During Treatment: Gait belt Activity Tolerance: Patient tolerated treatment well;Patient limited by fatigue;Patient limited by pain("achy" right shoulder with activity) Patient left: in chair;with nursing/sitter in room;with call bell/phone within reach;with chair alarm set Nurse Communication: Mobility status PT Visit Diagnosis: Unsteadiness on feet (R26.81);Other abnormalities of gait and mobility (R26.89);Muscle weakness (generalized)  (M62.81)    Time: 7356-7014 PT Time Calculation (min) (ACUTE ONLY): 30 min   Charges:   PT Evaluation $PT Eval Low Complexity: 1 Low PT Treatments $Gait Training: 8-22 mins        Floria Raveling. Hartnett-Rands, MS, PT Per [place] [phone] 02/22/2020, 9:56 AM

## 2020-02-22 NOTE — Plan of Care (Signed)

## 2020-02-22 NOTE — Discharge Instructions (Signed)
1) please take your medications including Seroquel and insulin as prescribed 2) follow-up with your primary care physician for further adjustments of  medications and for repeat CBC blood work 3) Home health oxygen and CPAP machine use advised 4) you will need outpatient follow-up with pulmonologist within the next week or 2 5) you are taking Eliquis/apixaban which is a blood thinner so please Avoid ibuprofen/Advil/Aleve/Motrin/Goody Powders/Naproxen/BC powders/Meloxicam/Diclofenac/Indomethacin and other Nonsteroidal anti-inflammatory medications as these will make you more likely to bleed and can cause stomach ulcers, can also cause Kidney problems.  6) you will need to follow-up with gastroenterologist as outpatient for further work-up of his anemia he probably need upper endoscopy and may be a colonoscopy

## 2020-02-22 NOTE — Progress Notes (Signed)
Pt is eating breakfast and would like nebulizer after breakfast. RT will return later

## 2020-02-22 NOTE — Progress Notes (Signed)
Inpatient Diabetes Program Recommendations  AACE/ADA: New Consensus Statement on Inpatient Glycemic Control   Target Ranges:  Prepandial:   less than 140 mg/dL      Peak postprandial:   less than 180 mg/dL (1-2 hours)      Critically ill patients:  140 - 180 mg/dL   Results for Rick Cruz, Rick Cruz" (MRN [id number]) as of 02/22/2020 08:29  Ref. Range 02/21/2020 10:59 02/21/2020 16:10 02/21/2020 20:34 02/21/2020 22:42 02/22/2020 04:22 02/22/2020 07:35  Glucose-Capillary Latest Ref Range: 70 - 99 mg/dL 290 (H) 314 (H) 298 (H) 328 (H) 237 (H) 325 (H)   Review of Glycemic Control  Diabetes history: DM2 Outpatient Diabetes medications: Lantus 35 units BID (per home med list patient is taking 50 units QHS), Humalog 30 units BID (morning and at bedtime) Current orders for Inpatient glycemic control: Lantus 10 units QHS, Novolog 0-15 units TID with meals, Novolog 0-5 units QHS, Novolog 3 units TID with meals  Inpatient Diabetes Program Recommendations:   Insulin-Basal: Please consider increasing Lantus to 12 units QHS.  Insulin-Meal Coverage: Please consider increasing meal coverage to Novolog 5 units TID with meals.  HgbA1C: Please consider ordering an A1C to assess glycemic control over the past 2-3 months.  Per chart review, noted patient was inpatient at [facility] 11/03/19-11/16/19 with DKA and A1C was noted to be 14% at that time.  Thanks, Barnie Alderman, RN, MSN, CDE Diabetes Coordinator Inpatient Diabetes Program [phone] (Team Pager from 8am to 5pm)

## 2020-02-22 NOTE — TOC Progression Note (Signed)
Transition of Care [facility]) - Progression Note    Patient Details  Name: Rick Cruz MRN: [id number] Date of Birth: October 13, 1945  [facility]) CM/SW Contact  Boneta Lucks, RN Phone Number: 02/22/2020, 1:15 PM  Clinical Narrative:   Patient admitted with acute metabolic encephalopathy.  MD discharging home today with  Home Health orders. Patient is active with [facility] in [place].  Octavia Bruckner Justice will contact them and update them on new orders.     Expected Discharge Plan: [place] Barriers to Discharge: Barriers Resolved  Expected Discharge Plan and Services Expected Discharge Plan: [place]         Expected Discharge Date: 02/22/20

## 2020-02-22 NOTE — Progress Notes (Signed)
Spoke with patient about use of CPAP. He stated that in the past he did use CPAP but no longer does so. He uses his oxygen at 2 liters especially when he is asleep. CPAP will be DC

## 2020-02-22 NOTE — Care Management Important Message (Signed)
Important Message  Patient Details  Name: Rick Cruz MRN: [id number] Date of Birth: 1946-01-19   Medicare Important Message Given:  Yes     Tommy Medal 02/22/2020, 3:45 PM

## 2020-02-22 NOTE — Progress Notes (Signed)
Dr. Denton Brick notified that pt's wife is not here to pick him up. Attempted to call pt's wife without success to notify her that Medicare won't pay after today due to discharge status. Attempted to call son also without an answer. Voicemail message left on wife's phone to please return call.  Deirdre Pippins, RN

## 2020-02-23 LAB — CULTURE, BLOOD (ROUTINE X 2)
Culture: NO GROWTH
Culture: NO GROWTH
Special Requests: ADEQUATE
Special Requests: ADEQUATE

## 2020-02-24 ENCOUNTER — Other Ambulatory Visit: Payer: Self-pay | Admitting: Gastroenterology

## 2020-03-11 ENCOUNTER — Ambulatory Visit (HOSPITAL_COMMUNITY): Admission: RE | Admit: 2020-03-11 | Payer: Medicare Other | Source: Home / Self Care | Admitting: Gastroenterology

## 2020-03-11 ENCOUNTER — Encounter (HOSPITAL_COMMUNITY): Admission: RE | Payer: Self-pay | Source: Home / Self Care

## 2020-03-11 SURGERY — ESOPHAGOGASTRODUODENOSCOPY (EGD) WITH PROPOFOL
Anesthesia: Monitor Anesthesia Care

## 2020-05-25 DIAGNOSIS — E785 Hyperlipidemia, unspecified: Secondary | ICD-10-CM | POA: Diagnosis not present

## 2020-05-25 DIAGNOSIS — Z9181 History of falling: Secondary | ICD-10-CM | POA: Diagnosis not present

## 2020-05-25 DIAGNOSIS — R42 Dizziness and giddiness: Secondary | ICD-10-CM | POA: Diagnosis not present

## 2020-05-25 DIAGNOSIS — Z794 Long term (current) use of insulin: Secondary | ICD-10-CM | POA: Diagnosis not present

## 2020-05-25 DIAGNOSIS — I272 Pulmonary hypertension, unspecified: Secondary | ICD-10-CM | POA: Diagnosis not present

## 2020-05-25 DIAGNOSIS — E1151 Type 2 diabetes mellitus with diabetic peripheral angiopathy without gangrene: Secondary | ICD-10-CM | POA: Diagnosis not present

## 2020-05-25 DIAGNOSIS — Z7901 Long term (current) use of anticoagulants: Secondary | ICD-10-CM | POA: Diagnosis not present

## 2020-05-25 DIAGNOSIS — G4730 Sleep apnea, unspecified: Secondary | ICD-10-CM | POA: Diagnosis not present

## 2020-05-25 DIAGNOSIS — J449 Chronic obstructive pulmonary disease, unspecified: Secondary | ICD-10-CM | POA: Diagnosis not present

## 2020-05-25 DIAGNOSIS — K219 Gastro-esophageal reflux disease without esophagitis: Secondary | ICD-10-CM | POA: Diagnosis not present

## 2020-05-25 DIAGNOSIS — I10 Essential (primary) hypertension: Secondary | ICD-10-CM | POA: Diagnosis not present

## 2020-05-25 DIAGNOSIS — Z9981 Dependence on supplemental oxygen: Secondary | ICD-10-CM | POA: Diagnosis not present

## 2020-05-25 DIAGNOSIS — Z79899 Other long term (current) drug therapy: Secondary | ICD-10-CM | POA: Diagnosis not present

## 2020-05-25 DIAGNOSIS — I259 Chronic ischemic heart disease, unspecified: Secondary | ICD-10-CM | POA: Diagnosis not present

## 2020-05-25 DIAGNOSIS — F5101 Primary insomnia: Secondary | ICD-10-CM | POA: Diagnosis not present

## 2020-05-30 DIAGNOSIS — F5101 Primary insomnia: Secondary | ICD-10-CM | POA: Diagnosis not present

## 2020-05-30 DIAGNOSIS — R42 Dizziness and giddiness: Secondary | ICD-10-CM | POA: Diagnosis not present

## 2020-05-30 DIAGNOSIS — I10 Essential (primary) hypertension: Secondary | ICD-10-CM | POA: Diagnosis not present

## 2020-05-30 DIAGNOSIS — Z7901 Long term (current) use of anticoagulants: Secondary | ICD-10-CM | POA: Diagnosis not present

## 2020-05-30 DIAGNOSIS — J449 Chronic obstructive pulmonary disease, unspecified: Secondary | ICD-10-CM | POA: Diagnosis not present

## 2020-05-30 DIAGNOSIS — Z9181 History of falling: Secondary | ICD-10-CM | POA: Diagnosis not present

## 2020-05-30 DIAGNOSIS — I259 Chronic ischemic heart disease, unspecified: Secondary | ICD-10-CM | POA: Diagnosis not present

## 2020-05-30 DIAGNOSIS — Z794 Long term (current) use of insulin: Secondary | ICD-10-CM | POA: Diagnosis not present

## 2020-05-30 DIAGNOSIS — E785 Hyperlipidemia, unspecified: Secondary | ICD-10-CM | POA: Diagnosis not present

## 2020-05-30 DIAGNOSIS — E1151 Type 2 diabetes mellitus with diabetic peripheral angiopathy without gangrene: Secondary | ICD-10-CM | POA: Diagnosis not present

## 2020-05-30 DIAGNOSIS — K219 Gastro-esophageal reflux disease without esophagitis: Secondary | ICD-10-CM | POA: Diagnosis not present

## 2020-05-30 DIAGNOSIS — I272 Pulmonary hypertension, unspecified: Secondary | ICD-10-CM | POA: Diagnosis not present

## 2020-05-30 DIAGNOSIS — G4730 Sleep apnea, unspecified: Secondary | ICD-10-CM | POA: Diagnosis not present

## 2020-05-30 DIAGNOSIS — Z79899 Other long term (current) drug therapy: Secondary | ICD-10-CM | POA: Diagnosis not present

## 2020-05-30 DIAGNOSIS — Z9981 Dependence on supplemental oxygen: Secondary | ICD-10-CM | POA: Diagnosis not present

## 2020-06-02 DIAGNOSIS — Z7901 Long term (current) use of anticoagulants: Secondary | ICD-10-CM | POA: Diagnosis not present

## 2020-06-02 DIAGNOSIS — F5101 Primary insomnia: Secondary | ICD-10-CM | POA: Diagnosis not present

## 2020-06-02 DIAGNOSIS — E785 Hyperlipidemia, unspecified: Secondary | ICD-10-CM | POA: Diagnosis not present

## 2020-06-02 DIAGNOSIS — Z794 Long term (current) use of insulin: Secondary | ICD-10-CM | POA: Diagnosis not present

## 2020-06-02 DIAGNOSIS — Z9981 Dependence on supplemental oxygen: Secondary | ICD-10-CM | POA: Diagnosis not present

## 2020-06-02 DIAGNOSIS — R42 Dizziness and giddiness: Secondary | ICD-10-CM | POA: Diagnosis not present

## 2020-06-02 DIAGNOSIS — K219 Gastro-esophageal reflux disease without esophagitis: Secondary | ICD-10-CM | POA: Diagnosis not present

## 2020-06-02 DIAGNOSIS — I10 Essential (primary) hypertension: Secondary | ICD-10-CM | POA: Diagnosis not present

## 2020-06-02 DIAGNOSIS — Z79899 Other long term (current) drug therapy: Secondary | ICD-10-CM | POA: Diagnosis not present

## 2020-06-02 DIAGNOSIS — G4730 Sleep apnea, unspecified: Secondary | ICD-10-CM | POA: Diagnosis not present

## 2020-06-02 DIAGNOSIS — I272 Pulmonary hypertension, unspecified: Secondary | ICD-10-CM | POA: Diagnosis not present

## 2020-06-02 DIAGNOSIS — J449 Chronic obstructive pulmonary disease, unspecified: Secondary | ICD-10-CM | POA: Diagnosis not present

## 2020-06-02 DIAGNOSIS — Z9181 History of falling: Secondary | ICD-10-CM | POA: Diagnosis not present

## 2020-06-02 DIAGNOSIS — E1165 Type 2 diabetes mellitus with hyperglycemia: Secondary | ICD-10-CM | POA: Diagnosis not present

## 2020-06-02 DIAGNOSIS — I259 Chronic ischemic heart disease, unspecified: Secondary | ICD-10-CM | POA: Diagnosis not present

## 2020-06-02 DIAGNOSIS — E1151 Type 2 diabetes mellitus with diabetic peripheral angiopathy without gangrene: Secondary | ICD-10-CM | POA: Diagnosis not present

## 2020-06-08 DIAGNOSIS — F5101 Primary insomnia: Secondary | ICD-10-CM | POA: Diagnosis not present

## 2020-06-08 DIAGNOSIS — I259 Chronic ischemic heart disease, unspecified: Secondary | ICD-10-CM | POA: Diagnosis not present

## 2020-06-08 DIAGNOSIS — E785 Hyperlipidemia, unspecified: Secondary | ICD-10-CM | POA: Diagnosis not present

## 2020-06-08 DIAGNOSIS — G4730 Sleep apnea, unspecified: Secondary | ICD-10-CM | POA: Diagnosis not present

## 2020-06-08 DIAGNOSIS — I272 Pulmonary hypertension, unspecified: Secondary | ICD-10-CM | POA: Diagnosis not present

## 2020-06-08 DIAGNOSIS — Z9981 Dependence on supplemental oxygen: Secondary | ICD-10-CM | POA: Diagnosis not present

## 2020-06-08 DIAGNOSIS — K219 Gastro-esophageal reflux disease without esophagitis: Secondary | ICD-10-CM | POA: Diagnosis not present

## 2020-06-08 DIAGNOSIS — Z9181 History of falling: Secondary | ICD-10-CM | POA: Diagnosis not present

## 2020-06-08 DIAGNOSIS — E1151 Type 2 diabetes mellitus with diabetic peripheral angiopathy without gangrene: Secondary | ICD-10-CM | POA: Diagnosis not present

## 2020-06-08 DIAGNOSIS — Z79899 Other long term (current) drug therapy: Secondary | ICD-10-CM | POA: Diagnosis not present

## 2020-06-08 DIAGNOSIS — I10 Essential (primary) hypertension: Secondary | ICD-10-CM | POA: Diagnosis not present

## 2020-06-08 DIAGNOSIS — J449 Chronic obstructive pulmonary disease, unspecified: Secondary | ICD-10-CM | POA: Diagnosis not present

## 2020-06-08 DIAGNOSIS — R42 Dizziness and giddiness: Secondary | ICD-10-CM | POA: Diagnosis not present

## 2020-06-08 DIAGNOSIS — Z7901 Long term (current) use of anticoagulants: Secondary | ICD-10-CM | POA: Diagnosis not present

## 2020-06-08 DIAGNOSIS — Z794 Long term (current) use of insulin: Secondary | ICD-10-CM | POA: Diagnosis not present

## 2020-06-13 DIAGNOSIS — J449 Chronic obstructive pulmonary disease, unspecified: Secondary | ICD-10-CM | POA: Diagnosis not present

## 2020-06-13 DIAGNOSIS — Z79899 Other long term (current) drug therapy: Secondary | ICD-10-CM | POA: Diagnosis not present

## 2020-06-13 DIAGNOSIS — E785 Hyperlipidemia, unspecified: Secondary | ICD-10-CM | POA: Diagnosis not present

## 2020-06-13 DIAGNOSIS — R42 Dizziness and giddiness: Secondary | ICD-10-CM | POA: Diagnosis not present

## 2020-06-13 DIAGNOSIS — E1151 Type 2 diabetes mellitus with diabetic peripheral angiopathy without gangrene: Secondary | ICD-10-CM | POA: Diagnosis not present

## 2020-06-13 DIAGNOSIS — Z9181 History of falling: Secondary | ICD-10-CM | POA: Diagnosis not present

## 2020-06-13 DIAGNOSIS — Z794 Long term (current) use of insulin: Secondary | ICD-10-CM | POA: Diagnosis not present

## 2020-06-13 DIAGNOSIS — G4730 Sleep apnea, unspecified: Secondary | ICD-10-CM | POA: Diagnosis not present

## 2020-06-13 DIAGNOSIS — Z7901 Long term (current) use of anticoagulants: Secondary | ICD-10-CM | POA: Diagnosis not present

## 2020-06-13 DIAGNOSIS — F5101 Primary insomnia: Secondary | ICD-10-CM | POA: Diagnosis not present

## 2020-06-13 DIAGNOSIS — K219 Gastro-esophageal reflux disease without esophagitis: Secondary | ICD-10-CM | POA: Diagnosis not present

## 2020-06-13 DIAGNOSIS — I259 Chronic ischemic heart disease, unspecified: Secondary | ICD-10-CM | POA: Diagnosis not present

## 2020-06-13 DIAGNOSIS — I272 Pulmonary hypertension, unspecified: Secondary | ICD-10-CM | POA: Diagnosis not present

## 2020-06-13 DIAGNOSIS — I10 Essential (primary) hypertension: Secondary | ICD-10-CM | POA: Diagnosis not present

## 2020-06-13 DIAGNOSIS — Z9981 Dependence on supplemental oxygen: Secondary | ICD-10-CM | POA: Diagnosis not present

## 2020-06-22 DIAGNOSIS — E785 Hyperlipidemia, unspecified: Secondary | ICD-10-CM | POA: Diagnosis not present

## 2020-06-22 DIAGNOSIS — I272 Pulmonary hypertension, unspecified: Secondary | ICD-10-CM | POA: Diagnosis not present

## 2020-06-22 DIAGNOSIS — K219 Gastro-esophageal reflux disease without esophagitis: Secondary | ICD-10-CM | POA: Diagnosis not present

## 2020-06-22 DIAGNOSIS — E1151 Type 2 diabetes mellitus with diabetic peripheral angiopathy without gangrene: Secondary | ICD-10-CM | POA: Diagnosis not present

## 2020-06-22 DIAGNOSIS — Z7901 Long term (current) use of anticoagulants: Secondary | ICD-10-CM | POA: Diagnosis not present

## 2020-06-22 DIAGNOSIS — Z79899 Other long term (current) drug therapy: Secondary | ICD-10-CM | POA: Diagnosis not present

## 2020-06-22 DIAGNOSIS — G4730 Sleep apnea, unspecified: Secondary | ICD-10-CM | POA: Diagnosis not present

## 2020-06-22 DIAGNOSIS — J449 Chronic obstructive pulmonary disease, unspecified: Secondary | ICD-10-CM | POA: Diagnosis not present

## 2020-06-22 DIAGNOSIS — R42 Dizziness and giddiness: Secondary | ICD-10-CM | POA: Diagnosis not present

## 2020-06-22 DIAGNOSIS — Z9981 Dependence on supplemental oxygen: Secondary | ICD-10-CM | POA: Diagnosis not present

## 2020-06-22 DIAGNOSIS — Z794 Long term (current) use of insulin: Secondary | ICD-10-CM | POA: Diagnosis not present

## 2020-06-22 DIAGNOSIS — Z9181 History of falling: Secondary | ICD-10-CM | POA: Diagnosis not present

## 2020-06-22 DIAGNOSIS — I259 Chronic ischemic heart disease, unspecified: Secondary | ICD-10-CM | POA: Diagnosis not present

## 2020-06-22 DIAGNOSIS — I10 Essential (primary) hypertension: Secondary | ICD-10-CM | POA: Diagnosis not present

## 2020-06-22 DIAGNOSIS — F5101 Primary insomnia: Secondary | ICD-10-CM | POA: Diagnosis not present

## 2020-07-02 DIAGNOSIS — E1165 Type 2 diabetes mellitus with hyperglycemia: Secondary | ICD-10-CM | POA: Diagnosis not present

## 2020-07-04 DIAGNOSIS — Z0000 Encounter for general adult medical examination without abnormal findings: Secondary | ICD-10-CM | POA: Diagnosis not present

## 2020-07-04 DIAGNOSIS — E7800 Pure hypercholesterolemia, unspecified: Secondary | ICD-10-CM | POA: Diagnosis not present

## 2020-07-04 DIAGNOSIS — I10 Essential (primary) hypertension: Secondary | ICD-10-CM | POA: Diagnosis not present

## 2020-07-04 DIAGNOSIS — E1165 Type 2 diabetes mellitus with hyperglycemia: Secondary | ICD-10-CM | POA: Diagnosis not present

## 2020-07-05 DIAGNOSIS — Z794 Long term (current) use of insulin: Secondary | ICD-10-CM | POA: Diagnosis not present

## 2020-07-05 DIAGNOSIS — E1165 Type 2 diabetes mellitus with hyperglycemia: Secondary | ICD-10-CM | POA: Diagnosis not present

## 2020-07-05 DIAGNOSIS — I4891 Unspecified atrial fibrillation: Secondary | ICD-10-CM | POA: Diagnosis not present

## 2020-07-05 DIAGNOSIS — I259 Chronic ischemic heart disease, unspecified: Secondary | ICD-10-CM | POA: Diagnosis not present

## 2020-07-05 DIAGNOSIS — E119 Type 2 diabetes mellitus without complications: Secondary | ICD-10-CM | POA: Diagnosis not present

## 2020-07-11 DIAGNOSIS — Z0000 Encounter for general adult medical examination without abnormal findings: Secondary | ICD-10-CM | POA: Diagnosis not present

## 2020-07-11 DIAGNOSIS — I259 Chronic ischemic heart disease, unspecified: Secondary | ICD-10-CM | POA: Diagnosis not present

## 2020-08-02 DIAGNOSIS — E1165 Type 2 diabetes mellitus with hyperglycemia: Secondary | ICD-10-CM | POA: Diagnosis not present

## 2020-09-01 DIAGNOSIS — E1165 Type 2 diabetes mellitus with hyperglycemia: Secondary | ICD-10-CM | POA: Diagnosis not present

## 2020-10-02 DIAGNOSIS — E1165 Type 2 diabetes mellitus with hyperglycemia: Secondary | ICD-10-CM | POA: Diagnosis not present

## 2020-10-11 ENCOUNTER — Encounter (HOSPITAL_COMMUNITY): Payer: Self-pay

## 2020-10-11 ENCOUNTER — Emergency Department (HOSPITAL_COMMUNITY): Payer: Medicare Other

## 2020-10-11 ENCOUNTER — Other Ambulatory Visit: Payer: Self-pay

## 2020-10-11 ENCOUNTER — Inpatient Hospital Stay (HOSPITAL_COMMUNITY)
Admission: EM | Admit: 2020-10-11 | Discharge: 2020-10-18 | DRG: 065 | Disposition: A | Discharge status: Skilled Nursing Facility | Payer: Medicare Other | Attending: Internal Medicine | Admitting: Internal Medicine

## 2020-10-11 DIAGNOSIS — M25552 Pain in left hip: Secondary | ICD-10-CM | POA: Diagnosis not present

## 2020-10-11 DIAGNOSIS — J9612 Chronic respiratory failure with hypercapnia: Secondary | ICD-10-CM | POA: Diagnosis present

## 2020-10-11 DIAGNOSIS — Z7951 Long term (current) use of inhaled steroids: Secondary | ICD-10-CM

## 2020-10-11 DIAGNOSIS — E8809 Other disorders of plasma-protein metabolism, not elsewhere classified: Secondary | ICD-10-CM | POA: Insufficient documentation

## 2020-10-11 DIAGNOSIS — M1990 Unspecified osteoarthritis, unspecified site: Secondary | ICD-10-CM | POA: Diagnosis present

## 2020-10-11 DIAGNOSIS — N390 Urinary tract infection, site not specified: Secondary | ICD-10-CM | POA: Diagnosis not present

## 2020-10-11 DIAGNOSIS — J9611 Chronic respiratory failure with hypoxia: Secondary | ICD-10-CM | POA: Diagnosis not present

## 2020-10-11 DIAGNOSIS — Z86010 Personal history of colonic polyps: Secondary | ICD-10-CM

## 2020-10-11 DIAGNOSIS — R29707 NIHSS score 7: Secondary | ICD-10-CM | POA: Diagnosis present

## 2020-10-11 DIAGNOSIS — F32A Depression, unspecified: Secondary | ICD-10-CM | POA: Diagnosis present

## 2020-10-11 DIAGNOSIS — I69952 Hemiplegia and hemiparesis following unspecified cerebrovascular disease affecting left dominant side: Secondary | ICD-10-CM | POA: Diagnosis not present

## 2020-10-11 DIAGNOSIS — J9610 Chronic respiratory failure, unspecified whether with hypoxia or hypercapnia: Secondary | ICD-10-CM | POA: Diagnosis present

## 2020-10-11 DIAGNOSIS — K219 Gastro-esophageal reflux disease without esophagitis: Secondary | ICD-10-CM | POA: Diagnosis not present

## 2020-10-11 DIAGNOSIS — I110 Hypertensive heart disease with heart failure: Secondary | ICD-10-CM | POA: Diagnosis present

## 2020-10-11 DIAGNOSIS — I10 Essential (primary) hypertension: Secondary | ICD-10-CM | POA: Diagnosis present

## 2020-10-11 DIAGNOSIS — G2401 Drug induced subacute dyskinesia: Secondary | ICD-10-CM | POA: Diagnosis not present

## 2020-10-11 DIAGNOSIS — U071 COVID-19: Secondary | ICD-10-CM | POA: Diagnosis not present

## 2020-10-11 DIAGNOSIS — Z6832 Body mass index (BMI) 32.0-32.9, adult: Secondary | ICD-10-CM | POA: Diagnosis not present

## 2020-10-11 DIAGNOSIS — E1140 Type 2 diabetes mellitus with diabetic neuropathy, unspecified: Secondary | ICD-10-CM | POA: Diagnosis present

## 2020-10-11 DIAGNOSIS — Z9981 Dependence on supplemental oxygen: Secondary | ICD-10-CM

## 2020-10-11 DIAGNOSIS — I6523 Occlusion and stenosis of bilateral carotid arteries: Secondary | ICD-10-CM | POA: Diagnosis not present

## 2020-10-11 DIAGNOSIS — I2510 Atherosclerotic heart disease of native coronary artery without angina pectoris: Secondary | ICD-10-CM | POA: Diagnosis present

## 2020-10-11 DIAGNOSIS — E119 Type 2 diabetes mellitus without complications: Secondary | ICD-10-CM | POA: Diagnosis not present

## 2020-10-11 DIAGNOSIS — Z888 Allergy status to other drugs, medicaments and biological substances status: Secondary | ICD-10-CM

## 2020-10-11 DIAGNOSIS — J449 Chronic obstructive pulmonary disease, unspecified: Secondary | ICD-10-CM | POA: Diagnosis present

## 2020-10-11 DIAGNOSIS — E871 Hypo-osmolality and hyponatremia: Secondary | ICD-10-CM | POA: Diagnosis present

## 2020-10-11 DIAGNOSIS — I5032 Chronic diastolic (congestive) heart failure: Secondary | ICD-10-CM | POA: Diagnosis not present

## 2020-10-11 DIAGNOSIS — G4733 Obstructive sleep apnea (adult) (pediatric): Secondary | ICD-10-CM | POA: Diagnosis present

## 2020-10-11 DIAGNOSIS — H5461 Unqualified visual loss, right eye, normal vision left eye: Secondary | ICD-10-CM | POA: Diagnosis not present

## 2020-10-11 DIAGNOSIS — R4189 Other symptoms and signs involving cognitive functions and awareness: Secondary | ICD-10-CM | POA: Diagnosis present

## 2020-10-11 DIAGNOSIS — I480 Paroxysmal atrial fibrillation: Secondary | ICD-10-CM | POA: Diagnosis present

## 2020-10-11 DIAGNOSIS — E66811 Obesity, class 1: Secondary | ICD-10-CM | POA: Diagnosis present

## 2020-10-11 DIAGNOSIS — I509 Heart failure, unspecified: Secondary | ICD-10-CM | POA: Diagnosis not present

## 2020-10-11 DIAGNOSIS — Z8349 Family history of other endocrine, nutritional and metabolic diseases: Secondary | ICD-10-CM

## 2020-10-11 DIAGNOSIS — Z20822 Contact with and (suspected) exposure to covid-19: Secondary | ICD-10-CM | POA: Diagnosis not present

## 2020-10-11 DIAGNOSIS — I639 Cerebral infarction, unspecified: Secondary | ICD-10-CM

## 2020-10-11 DIAGNOSIS — Z7902 Long term (current) use of antithrombotics/antiplatelets: Secondary | ICD-10-CM

## 2020-10-11 DIAGNOSIS — D500 Iron deficiency anemia secondary to blood loss (chronic): Secondary | ICD-10-CM | POA: Diagnosis not present

## 2020-10-11 DIAGNOSIS — M6281 Muscle weakness (generalized): Secondary | ICD-10-CM | POA: Diagnosis not present

## 2020-10-11 DIAGNOSIS — I6623 Occlusion and stenosis of bilateral posterior cerebral arteries: Secondary | ICD-10-CM | POA: Diagnosis not present

## 2020-10-11 DIAGNOSIS — I63211 Cerebral infarction due to unspecified occlusion or stenosis of right vertebral arteries: Principal | ICD-10-CM

## 2020-10-11 DIAGNOSIS — E785 Hyperlipidemia, unspecified: Secondary | ICD-10-CM | POA: Diagnosis not present

## 2020-10-11 DIAGNOSIS — H50111 Monocular exotropia, right eye: Secondary | ICD-10-CM | POA: Diagnosis present

## 2020-10-11 DIAGNOSIS — D509 Iron deficiency anemia, unspecified: Secondary | ICD-10-CM | POA: Insufficient documentation

## 2020-10-11 DIAGNOSIS — G8929 Other chronic pain: Secondary | ICD-10-CM | POA: Diagnosis present

## 2020-10-11 DIAGNOSIS — Z9119 Patient's noncompliance with other medical treatment and regimen: Secondary | ICD-10-CM

## 2020-10-11 DIAGNOSIS — I517 Cardiomegaly: Secondary | ICD-10-CM | POA: Diagnosis not present

## 2020-10-11 DIAGNOSIS — F200 Paranoid schizophrenia: Secondary | ICD-10-CM | POA: Diagnosis present

## 2020-10-11 DIAGNOSIS — Z8673 Personal history of transient ischemic attack (TIA), and cerebral infarction without residual deficits: Secondary | ICD-10-CM

## 2020-10-11 DIAGNOSIS — M79652 Pain in left thigh: Secondary | ICD-10-CM | POA: Diagnosis not present

## 2020-10-11 DIAGNOSIS — Z794 Long term (current) use of insulin: Secondary | ICD-10-CM

## 2020-10-11 DIAGNOSIS — Z87891 Personal history of nicotine dependence: Secondary | ICD-10-CM

## 2020-10-11 DIAGNOSIS — M11262 Other chondrocalcinosis, left knee: Secondary | ICD-10-CM | POA: Diagnosis not present

## 2020-10-11 DIAGNOSIS — R296 Repeated falls: Secondary | ICD-10-CM | POA: Diagnosis not present

## 2020-10-11 DIAGNOSIS — Z79891 Long term (current) use of opiate analgesic: Secondary | ICD-10-CM

## 2020-10-11 DIAGNOSIS — G8314 Monoplegia of lower limb affecting left nondominant side: Secondary | ICD-10-CM | POA: Diagnosis present

## 2020-10-11 DIAGNOSIS — Z79899 Other long term (current) drug therapy: Secondary | ICD-10-CM

## 2020-10-11 DIAGNOSIS — W19XXXA Unspecified fall, initial encounter: Secondary | ICD-10-CM

## 2020-10-11 DIAGNOSIS — I6389 Other cerebral infarction: Secondary | ICD-10-CM | POA: Diagnosis not present

## 2020-10-11 DIAGNOSIS — E669 Obesity, unspecified: Secondary | ICD-10-CM | POA: Diagnosis present

## 2020-10-11 DIAGNOSIS — Z743 Need for continuous supervision: Secondary | ICD-10-CM | POA: Diagnosis not present

## 2020-10-11 DIAGNOSIS — Z820 Family history of epilepsy and other diseases of the nervous system: Secondary | ICD-10-CM

## 2020-10-11 DIAGNOSIS — Z9181 History of falling: Secondary | ICD-10-CM

## 2020-10-11 DIAGNOSIS — R531 Weakness: Secondary | ICD-10-CM | POA: Diagnosis not present

## 2020-10-11 DIAGNOSIS — R509 Fever, unspecified: Secondary | ICD-10-CM

## 2020-10-11 DIAGNOSIS — E668 Other obesity: Secondary | ICD-10-CM | POA: Diagnosis present

## 2020-10-11 DIAGNOSIS — I6603 Occlusion and stenosis of bilateral middle cerebral arteries: Secondary | ICD-10-CM | POA: Diagnosis not present

## 2020-10-11 DIAGNOSIS — R0902 Hypoxemia: Secondary | ICD-10-CM | POA: Diagnosis not present

## 2020-10-11 DIAGNOSIS — R2689 Other abnormalities of gait and mobility: Secondary | ICD-10-CM | POA: Diagnosis not present

## 2020-10-11 DIAGNOSIS — R471 Dysarthria and anarthria: Secondary | ICD-10-CM | POA: Diagnosis present

## 2020-10-11 DIAGNOSIS — Z8616 Personal history of COVID-19: Secondary | ICD-10-CM | POA: Diagnosis not present

## 2020-10-11 LAB — CBC WITH DIFFERENTIAL/PLATELET
Abs Immature Granulocytes: 0.04 10*3/uL (ref 0.00–0.07)
Basophils Absolute: 0.1 10*3/uL (ref 0.0–0.1)
Basophils Relative: 1 %
Eosinophils Absolute: 0.3 10*3/uL (ref 0.0–0.5)
Eosinophils Relative: 3 %
HCT: 38.8 % — ABNORMAL LOW (ref 39.0–52.0)
Hemoglobin: 11.1 g/dL — ABNORMAL LOW (ref 13.0–17.0)
Immature Granulocytes: 0 %
Lymphocytes Relative: 19 %
Lymphs Abs: 2.2 10*3/uL (ref 0.7–4.0)
MCH: 24 pg — ABNORMAL LOW (ref 26.0–34.0)
MCHC: 28.6 g/dL — ABNORMAL LOW (ref 30.0–36.0)
MCV: 84 fL (ref 80.0–100.0)
Monocytes Absolute: 0.8 10*3/uL (ref 0.1–1.0)
Monocytes Relative: 7 %
Neutro Abs: 8.2 10*3/uL — ABNORMAL HIGH (ref 1.7–7.7)
Neutrophils Relative %: 70 %
Platelets: 249 10*3/uL (ref 150–400)
RBC: 4.62 MIL/uL (ref 4.22–5.81)
RDW: 15.3 % (ref 11.5–15.5)
WBC: 11.5 10*3/uL — ABNORMAL HIGH (ref 4.0–10.5)
nRBC: 0 % (ref 0.0–0.2)

## 2020-10-11 LAB — COMPREHENSIVE METABOLIC PANEL
ALT: 19 U/L (ref 0–44)
AST: 22 U/L (ref 15–41)
Albumin: 3.4 g/dL — ABNORMAL LOW (ref 3.5–5.0)
Alkaline Phosphatase: 82 U/L (ref 38–126)
Anion gap: 9 (ref 5–15)
BUN: 14 mg/dL (ref 8–23)
CO2: 33 mmol/L — ABNORMAL HIGH (ref 22–32)
Calcium: 9.1 mg/dL (ref 8.9–10.3)
Chloride: 95 mmol/L — ABNORMAL LOW (ref 98–111)
Creatinine, Ser: 1.11 mg/dL (ref 0.61–1.24)
GFR, Estimated: >60 mL/min (ref >60)
Glucose, Bld: 120 mg/dL — ABNORMAL HIGH (ref 70–99)
Potassium: 4.8 mmol/L (ref 3.5–5.1)
Sodium: 137 mmol/L (ref 135–145)
Total Bilirubin: 0.4 mg/dL (ref 0.3–1.2)
Total Protein: 6.7 g/dL (ref 6.5–8.1)

## 2020-10-11 LAB — PROTIME-INR
INR: 1.3 — ABNORMAL HIGH (ref 0.8–1.2)
Prothrombin Time: 15.3 seconds — ABNORMAL HIGH (ref 11.4–15.2)

## 2020-10-11 LAB — APTT: aPTT: 32 seconds (ref 24–36)

## 2020-10-11 LAB — ETHANOL: Alcohol, Ethyl (B): <10 mg/dL (ref <10)

## 2020-10-11 MED ORDER — VITAMIN D 25 MCG (1000 UNIT) PO TABS
1000.0000 [IU] | ORAL_TABLET | Freq: Two times a day (BID) | ORAL | Status: DC
  Administered 2020-10-11 – 2020-10-18 (×9): 1000 [IU] via ORAL
  Filled 2020-10-11 (×17): qty 1

## 2020-10-11 MED ORDER — DULOXETINE HCL 30 MG PO CPEP
30.0000 mg | ORAL_CAPSULE | Freq: Every morning | ORAL | Status: DC
  Administered 2020-10-12 – 2020-10-18 (×5): 30 mg via ORAL
  Filled 2020-10-11 (×8): qty 1

## 2020-10-11 MED ORDER — PANTOPRAZOLE SODIUM 40 MG PO TBEC
40.0000 mg | DELAYED_RELEASE_TABLET | Freq: Two times a day (BID) | ORAL | Status: DC
  Administered 2020-10-12 – 2020-10-18 (×11): 40 mg via ORAL
  Filled 2020-10-11 (×14): qty 1

## 2020-10-11 MED ORDER — TRIAMCINOLONE ACETONIDE 0.1 % EX CREA
1.0000 "application " | TOPICAL_CREAM | Freq: Two times a day (BID) | CUTANEOUS | Status: DC | PRN
  Filled 2020-10-11: qty 15

## 2020-10-11 MED ORDER — ASPIRIN 81 MG PO CHEW
81.0000 mg | CHEWABLE_TABLET | Freq: Once | ORAL | Status: AC
  Administered 2020-10-11: 81 mg via ORAL
  Filled 2020-10-11: qty 1

## 2020-10-11 MED ORDER — DOCUSATE SODIUM 100 MG PO CAPS
100.0000 mg | ORAL_CAPSULE | Freq: Two times a day (BID) | ORAL | Status: DC
  Administered 2020-10-11 – 2020-10-18 (×9): 100 mg via ORAL
  Filled 2020-10-11 (×15): qty 1

## 2020-10-11 MED ORDER — QUETIAPINE FUMARATE 25 MG PO TABS
25.0000 mg | ORAL_TABLET | Freq: Every morning | ORAL | Status: DC
  Administered 2020-10-12 – 2020-10-13 (×2): 25 mg via ORAL
  Filled 2020-10-11 (×3): qty 1

## 2020-10-11 MED ORDER — TRAMADOL HCL 50 MG PO TABS
100.0000 mg | ORAL_TABLET | Freq: Two times a day (BID) | ORAL | Status: DC | PRN
  Administered 2020-10-12 – 2020-10-13 (×2): 100 mg via ORAL
  Filled 2020-10-11 (×2): qty 2

## 2020-10-11 MED ORDER — HYDROXYZINE HCL 25 MG PO TABS: 25.0000 mg | ORAL_TABLET | Freq: Two times a day (BID) | ORAL | Status: DC | PRN

## 2020-10-11 MED ORDER — CARBOXYMETHYLCELLULOSE SODIUM 0.5 % OP SOLN: 1.0000 [drp] | Freq: Three times a day (TID) | OPHTHALMIC | Status: DC | PRN

## 2020-10-11 MED ORDER — INSULIN ASPART 100 UNIT/ML ~~LOC~~ SOLN
0.0000 [IU] | Freq: Three times a day (TID) | SUBCUTANEOUS | Status: DC
  Administered 2020-10-12: 3 [IU] via SUBCUTANEOUS
  Administered 2020-10-12 – 2020-10-13 (×3): 2 [IU] via SUBCUTANEOUS
  Administered 2020-10-13: 3 [IU] via SUBCUTANEOUS
  Administered 2020-10-14 (×3): 2 [IU] via SUBCUTANEOUS
  Administered 2020-10-15 – 2020-10-16 (×6): 3 [IU] via SUBCUTANEOUS
  Administered 2020-10-17: 2 [IU] via SUBCUTANEOUS
  Administered 2020-10-17 – 2020-10-18 (×3): 3 [IU] via SUBCUTANEOUS
  Administered 2020-10-18: 2 [IU] via SUBCUTANEOUS
  Administered 2020-10-18: 3 [IU] via SUBCUTANEOUS

## 2020-10-11 MED ORDER — UMECLIDINIUM BROMIDE 62.5 MCG/INH IN AEPB
1.0000 | INHALATION_SPRAY | Freq: Every day | RESPIRATORY_TRACT | Status: DC
  Administered 2020-10-12 – 2020-10-18 (×5): 1 via RESPIRATORY_TRACT
  Filled 2020-10-11: qty 7

## 2020-10-11 MED ORDER — ONDANSETRON HCL 4 MG PO TABS: 4.0000 mg | ORAL_TABLET | Freq: Four times a day (QID) | ORAL | Status: DC | PRN

## 2020-10-11 MED ORDER — GABAPENTIN 300 MG PO CAPS
600.0000 mg | ORAL_CAPSULE | Freq: Every day | ORAL | Status: DC
  Administered 2020-10-11 – 2020-10-17 (×4): 600 mg via ORAL
  Filled 2020-10-11 (×7): qty 2

## 2020-10-11 MED ORDER — INSULIN GLARGINE 100 UNIT/ML ~~LOC~~ SOLN
20.0000 [IU] | Freq: Every day | SUBCUTANEOUS | Status: DC
  Administered 2020-10-11 – 2020-10-17 (×6): 20 [IU] via SUBCUTANEOUS
  Filled 2020-10-11 (×8): qty 0.2

## 2020-10-11 MED ORDER — ACETAMINOPHEN 650 MG RE SUPP
650.0000 mg | Freq: Four times a day (QID) | RECTAL | Status: DC | PRN
  Filled 2020-10-11: qty 1

## 2020-10-11 MED ORDER — TIOTROPIUM BROMIDE MONOHYDRATE 18 MCG IN CAPS: 18.0000 ug | ORAL_CAPSULE | Freq: Every day | RESPIRATORY_TRACT | Status: DC

## 2020-10-11 MED ORDER — FLUTICASONE-UMECLIDIN-VILANT 100-62.5-25 MCG/INH IN AEPB: 1.0000 | INHALATION_SPRAY | Freq: Every day | RESPIRATORY_TRACT | Status: DC

## 2020-10-11 MED ORDER — ACETAMINOPHEN 325 MG PO TABS
650.0000 mg | ORAL_TABLET | Freq: Four times a day (QID) | ORAL | Status: DC | PRN
  Administered 2020-10-14: 650 mg via ORAL
  Filled 2020-10-11 (×2): qty 2

## 2020-10-11 MED ORDER — POLYVINYL ALCOHOL 1.4 % OP SOLN: 1.0000 [drp] | Freq: Three times a day (TID) | OPHTHALMIC | Status: DC | PRN

## 2020-10-11 MED ORDER — DULAGLUTIDE 3 MG/0.5ML ~~LOC~~ SOAJ: 3.0000 mg | SUBCUTANEOUS | Status: DC

## 2020-10-11 MED ORDER — ALBUTEROL SULFATE (2.5 MG/3ML) 0.083% IN NEBU: 2.5000 mg | INHALATION_SOLUTION | Freq: Four times a day (QID) | RESPIRATORY_TRACT | Status: DC | PRN

## 2020-10-11 MED ORDER — SIMVASTATIN 20 MG PO TABS
40.0000 mg | ORAL_TABLET | Freq: Every evening | ORAL | Status: DC
  Administered 2020-10-11 – 2020-10-18 (×7): 40 mg via ORAL
  Filled 2020-10-11 (×2): qty 2
  Filled 2020-10-11: qty 4
  Filled 2020-10-11 (×4): qty 2

## 2020-10-11 MED ORDER — MELATONIN 3 MG PO TABS
6.0000 mg | ORAL_TABLET | Freq: Every day | ORAL | Status: DC
  Administered 2020-10-11 – 2020-10-17 (×4): 6 mg via ORAL
  Filled 2020-10-11 (×7): qty 2

## 2020-10-11 MED ORDER — FLUTICASONE FUROATE-VILANTEROL 100-25 MCG/INH IN AEPB
1.0000 | INHALATION_SPRAY | Freq: Every day | RESPIRATORY_TRACT | Status: DC
  Administered 2020-10-12 – 2020-10-18 (×5): 1 via RESPIRATORY_TRACT
  Filled 2020-10-11: qty 28

## 2020-10-11 MED ORDER — ONDANSETRON HCL 4 MG/2ML IJ SOLN: 4.0000 mg | Freq: Four times a day (QID) | INTRAMUSCULAR | Status: DC | PRN

## 2020-10-11 MED ORDER — STROKE: EARLY STAGES OF RECOVERY BOOK
Freq: Once | Status: AC
  Filled 2020-10-11: qty 1

## 2020-10-11 MED ORDER — QUETIAPINE FUMARATE 25 MG PO TABS
50.0000 mg | ORAL_TABLET | Freq: Every day | ORAL | Status: DC
  Administered 2020-10-11 – 2020-10-12 (×2): 50 mg via ORAL
  Filled 2020-10-11 (×4): qty 2

## 2020-10-11 NOTE — ED Provider Notes (Signed)
[facility] Provider Note   CSN: [id number] Arrival date & time: 10/11/20  1121     History Chief Complaint  Patient presents with  . Hip Pain    Rick Cruz is a 75 y.o. male with PMHx HTN, hypercholesterolemia, Diabetes, COPD, CHF on Home O2 who presents to the ED today via EMS with complaint of left hip pain/soreness. Pt reports that he typically walks with a walker however his leg has continued giving out on him at times causing him to fall. Most recent fall was yesterday when his leg gave out on him again - pt reports he landed onto his left hip causing pain. No head injury or LOC. He states he had to call the fire department to come help him back up however assumed that his hip would feel better by the morning so he didn't come to the ED at that time to be evaluated. Pt takes Tramadol for chronic pain and reports it is mildly relieving his hip pain. Per EMS pt was able to stand and assist with getting onto stretcher without difficulty. No other complaints at this time.   The history is provided by the patient and medical records.       Past Medical History:  Diagnosis Date  . Arthritis   . 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 CHF (congestive heart failure) (HCC)    diastolic   . Chronic pain   . COPD (chronic obstructive pulmonary disease) ([place])   . Depression   . Diabetes mellitus without complication (HCC)    insulin pump  . Hypercholesterolemia   . Hypertension   . On home O2    3 liters  . Oxygen deficiency   . Schizophrenia ([place])   . Sleep apnea    noncompliant with BiPAP  . Sleep apnea   . Tobacco abuse     Patient Active Problem List   Diagnosis Date Noted  . Acute metabolic encephalopathy 40/98/1191  . Heart failure ([place]) 02/02/2019  . Hyperglycemia   . Altered mental status 11/21/2018  . Diabetic foot ([place]) 07/23/2018  . Accidental fall 06/25/2018  . Supplemental oxygen dependent 06/25/2018  . 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   . Neck pain 01/09/2018  . 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 Diabetic neuropathy ([place]) 12/13/2017  . Gastroesophageal reflux disease 12/13/2017  . Insomnia 07/30/2017  . Depression 07/19/2016  . OSA (obstructive sleep apnea)   . Chronic respiratory failure with hypercapnia ([place])   . Undifferentiated schizophrenia ([place])   . Uncontrolled type 2 diabetes mellitus with complication ([place])   . Anemia of chronic disease   . Paroxysmal atrial fibrillation (HCC)   . Hyponatremia 06/12/2016  . Aspiration pneumonia ([place]) 08/16/2015  . Seborrheic keratosis 08/04/2015  . Pure hypercholesterolemia 07/05/2015  . Obesity hypoventilation syndrome ([place]) 04/05/2015  . CSA (central sleep apnea) 04/05/2015  . Non compliance with medical treatment 04/05/2015  . Type 2 diabetes mellitus with hyperglycemia ([place]) 02/28/2015  . Acute respiratory failure with hypoxia and hypercarbia (HCC)   . Chronic diastolic CHF (congestive heart failure) ([place]) 08/06/2014  . Recurrent falls 05/08/2014  . Ankle fracture, right 05/08/2014  . Ankle fracture, left 05/08/2014  . C. difficile colitis 01/04/2014  . Delirium 01/02/2014  . Acute encephalopathy 01/02/2014  . Morbid obesity ([place]) 06/17/2013  . Erosive esophagitis 04/22/2013  . Chronic respiratory failure 02 dep with hypercarbia 04/22/2013  . Type 2 diabetes mellitus without complication, with long-term current use of  insulin ([place]) 04/21/2013  . Paranoid schizophrenia ([place]) 04/18/2013  . CAD (coronary artery disease) 04/18/2013  . DYSLIPIDEMIA 08/25/2008  . Obstructive sleep apnea 08/25/2008  . Essential hypertension 08/25/2008  . Dyslipidemia 08/25/2008    Past Surgical History:  Procedure Laterality Date  . COLON SURGERY  March 2010   secondary to large colon polyp, final path per discharge summary notes tubulovillous adenoma.   . COLONOSCOPY  July 2011   Dr. Benson [place]: multiple polyps, internal and external hemorrhoids, diverticulosis. tubular  adenoma  . ESOPHAGOGASTRODUODENOSCOPY (EGD) WITH PROPOFOL N/A 04/20/2013   Procedure: ESOPHAGOGASTRODUODENOSCOPY (EGD) WITH PROPOFOL;  Surgeon: Daneil Dolin, MD;  Location: [facility];  Service: Endoscopy;  Laterality: N/A;  . KNEE SURGERY     X2       Family History  Problem Relation Age of Onset  . Thyroid disease Mother   . Parkinson's disease Father   . Parkinson's disease Other   . Colon cancer Neg Hx     Social History   Tobacco Use  . Smoking status: Former Smoker    Packs/day: 1.00    Years: 50.00    Pack years: 50.00    Types: Cigarettes    Quit date: 09/16/2013    Years since quitting: 7.0  . Smokeless tobacco: Never Used  Substance Use Topics  . Alcohol use: No    Comment: history of ETOH abuse in remote past, none in at least 10 years  . Drug use: No    Home Medications Prior to Admission medications   Medication Sig Start Date End Date Taking? Authorizing Provider  albuterol (PROVENTIL) (2.5 MG/3ML) 0.083% nebulizer solution Take 3 mLs (2.5 mg total) by nebulization every 6 (six) hours as needed for wheezing or shortness of breath. 02/22/20  Yes Emokpae, Courage, MD  apixaban (ELIQUIS) 5 MG TABS tablet Take 1 tablet (5 mg total) by mouth 2 (two) times daily. 02/22/20  Yes Emokpae, Courage, MD  carboxymethylcellulose (REFRESH PLUS) 0.5 % SOLN Apply 1 drop to eye 3 (three) times daily as needed.   Yes [provider]  cholecalciferol (VITAMIN D) 1000 UNITS tablet Take 1,000 Units by mouth 2 (two) times daily.    Yes [provider]  Cinnamon 500 MG capsule Take 1,000 mg by mouth in the morning and at bedtime.    Yes [provider]  docusate sodium (COLACE) 100 MG capsule Take 100 mg by mouth 2 (two) times daily.   Yes [provider]  DULoxetine (CYMBALTA) 30 MG capsule Take 1 capsule (30 mg total) by mouth every morning. 02/22/20  Yes Emokpae, Courage, MD  Fluticasone-Umeclidin-Vilant (TRELEGY ELLIPTA) 100-62.5-25 MCG/INH AEPB Inhale  1 puff into the lungs daily. 02/22/20  Yes Emokpae, Courage, MD  furosemide (LASIX) 20 MG tablet Take 2 tablets (40 mg total) by mouth daily. Patient taking differently: Take 60 mg by mouth daily. 02/22/20  Yes Emokpae, Courage, MD  gabapentin (NEURONTIN) 300 MG capsule Take 1 capsule (300 mg total) by mouth 3 (three) times daily. Patient taking differently: Take 600 mg by mouth at bedtime. 11/22/18  Yes Barton Dubois, MD  hydrOXYzine (ATARAX/VISTARIL) 25 MG tablet Take 1 tablet (25 mg total) by mouth every 12 (twelve) hours as needed for anxiety or itching (Anxiety and insomnia or restlessness). 02/22/20  Yes Emokpae, Courage, MD  insulin glargine (LANTUS SOLOSTAR) 100 UNIT/ML Solostar Pen Inject 25 Units into the skin 2 (two) times daily. Patient taking differently: Inject 20 Units into the skin at bedtime. 02/22/20  Yes Emokpae,  Courage, MD  insulin lispro (HUMALOG) 100 UNIT/ML injection Inject 0-0.1 mLs (0-10 Units total) into the skin See admin instructions. Per sliding scale insulin Lispro (Humalog) injection 0-10 Units 0-10 Units Subcutaneous, 3 times daily with meals CBG 70 - 120: 0 unit CBG 121 - 150: 0 unit  CBG 151 - 200: 1 unit CBG 201 - 250: 2 units CBG 251 - 300: 4 units CBG 301 - 350: 6 units  CBG 351 - 400: 8 units  CBG > 400: 10 units Patient taking differently: Inject 13 Units into the skin in the morning and at bedtime. 02/22/20  Yes Emokpae, Courage, MD  melatonin 3 MG TABS tablet Take 6 mg by mouth at bedtime.   Yes [provider]  Multiple Vitamins-Minerals (CENTRUM SILVER 50+MEN PO) Take 1 tablet by mouth daily.   Yes [provider]  OXYGEN Inhale 3 L into the lungs continuous.   Yes [provider]  QUEtiapine (SEROQUEL) 25 MG tablet Take 1 tablet (25 mg total) by mouth in the morning. 02/22/20  Yes Emokpae, Courage, MD  QUEtiapine (SEROQUEL) 50 MG tablet Take 1 tablet (50 mg total) by mouth at bedtime. 02/22/20  Yes Emokpae, Courage, MD  simvastatin (ZOCOR) 40 MG  tablet Take 40 mg by mouth every evening.   Yes [provider]  tiotropium (SPIRIVA) 18 MCG inhalation capsule Place 1 capsule (18 mcg total) into inhaler and inhale daily. 02/22/20  Yes Emokpae, Courage, MD  traMADol (ULTRAM) 50 MG tablet Take 100 mg by mouth 2 (two) times daily as needed for moderate pain.   Yes [provider]  triamcinolone cream (KENALOG) 0.1 % Apply 1 application topically 2 (two) times daily. 02/11/20  Yes Avegno, Darrelyn Hillock, FNP  TRULICITY 3 KK/9.3GH SOPN Inject 3 mg into the skin every Friday. 06/07/20  Yes [provider]  acetaminophen (TYLENOL) 325 MG tablet Take 2 tablets (650 mg total) by mouth every 6 (six) hours as needed for mild pain, fever or headache (or Fever >/= 101). Patient not taking: No sig reported 02/22/20   Roxan Hockey, MD  blood glucose meter kit and supplies Relion Prime or Dispense other brand based on patient and insurance preference. Use up to four times daily as directed. (FOR ICD-9 250.00, 250.01). 02/22/20   Roxan Hockey, MD  blood glucose meter kit and supplies Dispense based on patient and insurance preference. Use up to four times daily as directed. (FOR ICD-9 250.00, 250.01).  --Otherwise may dispense ReliOn Prime glucometer, test strips and lancets 02/22/20   Roxan Hockey, MD  meclizine (ANTIVERT) 25 MG tablet Take 25 mg by mouth 3 (three) times daily as needed for dizziness. Patient not taking: Reported on 10/11/2020    [provider]  metoprolol tartrate (LOPRESSOR) 50 MG tablet Take 1 tablet (50 mg total) by mouth 2 (two) times daily. Patient not taking: No sig reported 02/22/20   Roxan Hockey, MD  Multiple Vitamins-Minerals (PRESERVISION AREDS 2+MULTI VIT PO) Take 2 tablets by mouth in the morning and at bedtime. Patient not taking: Reported on 10/11/2020    [provider]  pantoprazole (PROTONIX) 40 MG tablet Take 1 tablet (40 mg total) by mouth 2 (two) times daily before a  meal. Patient not taking: No sig reported 02/22/20   Roxan Hockey, MD  polyethylene glycol (MIRALAX / GLYCOLAX) 17 g packet Take 17 g by mouth daily. Patient not taking: No sig reported 02/23/20   Roxan Hockey, MD  predniSONE (DELTASONE) 20 MG tablet Take  2 tablets (40 mg total) by mouth daily with breakfast. Patient not taking: No sig reported 02/22/20   Roxan Hockey, MD  saw palmetto 160 MG capsule Take 160 mg by mouth 2 (two) times daily. Patient not taking: Reported on 10/11/2020    [provider]  triamcinolone cream (KENALOG) 0.1 % Apply 1 application topically 2 (two) times daily. Patient not taking: No sig reported 02/13/20   Wurst, [place], PA-C    Allergies    Phenergan [promethazine hcl], Haldol [haloperidol lactate], and Metformin and related  Review of Systems   Review of Systems  Musculoskeletal: Positive for arthralgias.  Skin: Negative for wound.  Neurological: Negative for syncope and headaches.  All other systems reviewed and are negative.   Physical Exam Updated Vital Signs BP 121/77 (BP Location: Right Arm)   Pulse 67   Temp 98.3 F (36.8 C) (Oral)   Resp (!) 24   Ht 6' (1.829 m)   Wt 107.5 kg   SpO2 100%   BMI 32.14 kg/m   Physical Exam Vitals and nursing note reviewed.  Constitutional:      Appearance: He is not ill-appearing or diaphoretic.  HENT:     Head: Normocephalic and atraumatic.  Eyes:     Conjunctiva/sclera: Conjunctivae normal.  Cardiovascular:     Rate and Rhythm: Normal rate and regular rhythm.     Pulses: Normal pulses.  Pulmonary:     Effort: Pulmonary effort is normal.     Breath sounds: Normal breath sounds. No wheezing, rhonchi or rales.  Abdominal:     Palpations: Abdomen is soft.     Tenderness: There is no abdominal tenderness. There is no guarding or rebound.  Musculoskeletal:     Cervical back: Neck supple.     Comments: No swelling or ecchymosis appreciated to left hip. No external rotation or leg  shortening. + mild diffuse TTP to the lateral aspect of the left hip as well as the left mid femur. + mild TTP to the knee itself; old healed surgical scar to knee. No tenderness distally to the knee. Pt unable to lift leg off the stretcher; with assistance he is also unable to hold it against gravity (reports this has been ongoing for weeks). Strength 5/5 to LUE and RUE/RLE.   Skin:    General: Skin is warm and dry.  Neurological:     Mental Status: He is alert.     Comments: Alert and oriented to self, place, time and event.   Speech is fluent, clear without dysarthria or dysphasia.   No pronator drift to BUEs. Unable to assess BLEs in the setting of LLE weakness.  Normal finger-to-nose. CN I not tested  CN II grossly intact visual fields bilaterally. Did not visualize posterior eye.   CN III, IV, VI PERRLA and EOMs intact bilaterally  CN V Intact sensation to sharp and light touch to the face  CN VII facial movements symmetric  CN VIII not tested  CN IX, X no uvula deviation, symmetric rise of soft palate  CN XI 5/5 SCM and trapezius strength bilaterally  CN XII Midline tongue protrusion, symmetric L/R movements      ED Results / Procedures / Treatments   Labs (all labs ordered are listed, but only abnormal results are displayed) Labs Reviewed  CBC WITH DIFFERENTIAL/PLATELET - Abnormal; Notable for the following components:      Result Value   WBC 11.5 (*)    Hemoglobin 11.1 (*)    HCT 38.8 (*)  MCH 24.0 (*)    MCHC 28.6 (*)    Neutro Abs 8.2 (*)    All other components within normal limits  COMPREHENSIVE METABOLIC PANEL - Abnormal; Notable for the following components:   Chloride 95 (*)    CO2 33 (*)    Glucose, Bld 120 (*)    Albumin 3.4 (*)    All other components within normal limits  SARS CORONAVIRUS 2 (TAT 6-24 HRS)  URINALYSIS, ROUTINE W REFLEX MICROSCOPIC  ETHANOL  PROTIME-INR  APTT  RAPID URINE DRUG SCREEN, HOSP PERFORMED    EKG None  Radiology DG  Pelvis 1-2 Views  Result Date: 10/11/2020 CLINICAL DATA:  Left hip soreness.  History of falls. EXAM: PELVIS - 1-2 VIEW COMPARISON:  Abdomen 05/23/2016.  Pelvis 12/05/2014. FINDINGS: Stable deformity right ilium. Degenerative changes lumbar spine and both hips. No acute bony or joint abnormality identified. Aortoiliac atherosclerotic vascular calcification. Pelvic calcifications consistent phleboliths. Ill-defined density noted over the lower pelvis/perineum, most likely external to the patient, possibly a bandage. IMPRESSION: 1. Degenerative changes lumbar spine and both hips. No acute bony abnormality identified. 2. Aortoiliac atherosclerotic vascular disease. Electronically Signed   By: Marcello Moores  Register   On: 10/11/2020 13:39   CT Head Wo Contrast  Result Date: 10/11/2020 CLINICAL DATA:  Left leg weakness EXAM: CT HEAD WITHOUT CONTRAST TECHNIQUE: Contiguous axial images were obtained from the base of the skull through the vertex without intravenous contrast. COMPARISON:  CT brain 02/18/2020, brain MRI 04/27/2019 FINDINGS: Brain: No acute territorial infarction, hemorrhage, or intracranial mass. Moderate atrophy. Moderate hypodensity in the white matter consistent with chronic small vessel ischemic change. Possible age indeterminate lacunar infarct in the right thalamus. Stable ventricle size. Vascular: No hyperdense vessels.  Carotid vascular calcification Skull: Normal. Negative for fracture or focal lesion. Sinuses/Orbits: No acute finding. Other: None IMPRESSION: 1. Possible age indeterminate lacunar infarct in the right thalamus. Otherwise no CT evidence for acute intracranial abnormality. 2. Atrophy and chronic small vessel ischemic changes of the white matter. Electronically Signed   By: Donavan Foil M.D.   On: 10/11/2020 15:36   MR ANGIO HEAD WO CONTRAST  Addendum Date: 10/11/2020   ADDENDUM REPORT: 10/11/2020 17:38 ADDENDUM: Findings of acute right pontine infarct and likely occlusion of the  right vertebral artery called by telephone at the time of interpretation on 10/11/2020 at 5:38 pm to provider PA Alroy Bailiff, who verbally acknowledged these results. Electronically Signed   By: Kellie Simmering DO   On: 10/11/2020 17:38   Result Date: 10/11/2020 CLINICAL DATA:  Stroke, follow-up. Additional history provided: Fatigue, bilateral weakness for 2 days, no known injury. EXAM: MRI HEAD WITHOUT CONTRAST MRA HEAD WITHOUT CONTRAST TECHNIQUE: Multiplanar, multiecho pulse sequences of the brain and surrounding structures were obtained without intravenous contrast. Angiographic images of the head were obtained using MRA technique without contrast. COMPARISON:  Head CT 10/11/2020. brain MRI 04/27/2019. FINDINGS: MRI HEAD FINDINGS Brain: Multiple sequences are significantly motion degraded, limiting evaluation. Most notably, there is moderate motion degradation of the sagittal T1 weighted sequence, mild/moderate motion degradation of the axial T2 weighted sequence, severe motion degradation of the axial T2* sequence, severe motion degradation of the axial T1 weighted sequence, moderate motion degradation of the coronal T2 weighted sequence and moderate motion degradation of the axial T2/FLAIR sequence. Moderate cerebral atrophy.  Comparatively mild cerebellar atrophy. 7 mm acute infarct within the right pons (series 5, image 11) (series 7, image 17). Chronic right thalamic lacunar infarct. Moderately severe multifocal T2/FLAIR hyperintensity within  the cerebral white matter is nonspecific, but compatible with chronic small vessel ischemic disease. No evidence of intracranial mass. No chronic intracranial blood products. No extra-axial fluid collection. No midline shift. Vascular: Reported below. Skull and upper cervical spine: No focal marrow lesion. Sinuses/Orbits: Visualized orbits show no acute finding. No significant paranasal sinus disease. Other: Trace fluid within the left mastoid air cells. MRA HEAD FINDINGS Mild  to moderately motion degraded exam. The intracranial internal carotid arteries are patent. Apparent high-grade stenosis within the proximal cavernous left ICA, which could be exaggerated by motion artifact on the current exam. The M1 middle cerebral arteries are patent. Apparent moderate stenosis within the distal M1 left middle cerebral artery. Additionally, there are apparent high-grade stenoses within bilateral proximal to mid M2 MCA branches. The A1 left ACA is developmentally hypoplastic or absent. The anterior cerebral arteries are otherwise patent. Apparent moderate stenosis within the proximal A2 left ACA. 1-2 mm inferiorly projecting vascular protrusion arising from the paraclinoid right ICA which may reflect an aneurysm or infundibulum (series 1045, image 15). There is a marked paucity of flow related signal arising from the intracranial right vertebral artery and this vessel is likely occluded. The left vertebral artery is patent without significant stenosis. The basilar artery is patent with mild atherosclerotic irregularity. The posterior cerebral arteries are patent. Apparent multifocal severe stenoses within the P2 right posterior cerebral artery. Apparent multifocal. Apparent moderate focal stenosis within the proximal P2 left PCA. 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 A left posterior communicating artery is present. The right posterior communicating artery is hypoplastic or absent. IMPRESSION: MRI brain: 1. Significantly motion degraded exam. 2. 7 mm acute infarct within the right pons. 3. Chronic right thalamic lacunar infarct. 4. Background moderate cerebral atrophy and moderately severe cerebral white matter chronic small vessel ischemic disease. MRA head: 1. Mild to moderately motion degraded exam. 2. Paucity of flow-related signal arising from the intracranial right vertebral artery. This vessel is likely occluded. 3. The basilar artery is patent with mild  atherosclerotic irregularity. 4. Additional apparent intracranial stenoses as follows (please note these apparent stenoses may be exaggerated by motion artifact on the current exam). 5. Apparent high-grade stenosis within the proximal cavernous left ICA. 6. Apparent moderate stenosis within the distal M1 left MCA. 7. Apparent high-grade stenoses within bilateral proximal to mid M2 MCA branches. 8. Apparent moderate stenosis with proximal A2 left ACA. 9. Apparent multifocal severe stenoses within the P2 right PCA. 10. Apparent moderate focal stenosis within the proximal P2 left PCA. 11. 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 Electronically Signed   By: Kellie Simmering DO   On: 10/11/2020 17:38   Result Date: 10/11/2020 CLINICAL DATA:  Stroke, follow-up. Additional history provided: Fatigue, bilateral weakness for 2 days, no known injury. EXAM: MRI HEAD WITHOUT CONTRAST MRA HEAD WITHOUT CONTRAST TECHNIQUE: Multiplanar, multiecho pulse sequences of the brain and surrounding structures were obtained without intravenous contrast. Angiographic images of the head were obtained using MRA technique without contrast. COMPARISON:  Head CT 10/11/2020. brain MRI 04/27/2019. FINDINGS: MRI HEAD FINDINGS Brain: Multiple sequences  are significantly motion degraded, limiting evaluation. Most notably, there is moderate motion degradation of the sagittal T1 weighted sequence, mild/moderate motion degradation of the axial T2 weighted sequence, severe motion degradation of the axial T2* sequence, severe motion degradation of  the axial T1 weighted sequence, moderate motion degradation of the coronal T2 weighted sequence and moderate motion degradation of the axial T2/FLAIR sequence. Moderate cerebral atrophy.  Comparatively mild cerebellar atrophy. 7 mm acute infarct within the right pons (series 5, image 11) (series 7, image 17). Chronic right thalamic lacunar infarct. Moderately severe multifocal T2/FLAIR hyperintensity within the cerebral white matter is nonspecific, but compatible with chronic small vessel ischemic disease. No evidence of intracranial mass. No chronic intracranial blood products. No extra-axial fluid collection. No midline shift. Vascular: Reported below. Skull and upper cervical spine: No focal marrow lesion. Sinuses/Orbits: Visualized orbits show no acute finding. No significant paranasal sinus disease. Other: Trace fluid within the left mastoid air cells. MRA HEAD FINDINGS Mild to moderately motion degraded exam. The intracranial internal carotid arteries are patent. Apparent high-grade stenosis within the proximal cavernous left ICA, which could be exaggerated by motion artifact on the current exam. The M1 middle cerebral arteries are patent. Apparent moderate stenosis within the distal M1 left middle cerebral artery. Additionally, there are apparent high-grade stenoses within bilateral proximal to mid M2 MCA branches. The A1 left ACA is developmentally hypoplastic or absent. The anterior cerebral arteries are otherwise patent. Apparent moderate stenosis within the proximal A2 left ACA. 1-2 mm inferiorly projecting vascular protrusion arising from the paraclinoid right ICA which may reflect an aneurysm or infundibulum (series 1045, image 15). There is a marked paucity of flow related signal arising from the intracranial right vertebral artery and this vessel is likely occluded. The left vertebral artery is patent without significant stenosis. The basilar artery is patent with mild atherosclerotic irregularity.  The posterior cerebral arteries are patent. Apparent multifocal severe stenoses within the P2 right posterior cerebral artery. Apparent multifocal. Apparent moderate focal stenosis within the proximal P2 left PCA. 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 A left posterior communicating artery is present. The right posterior communicating artery is hypoplastic or absent. IMPRESSION: MRI brain: 1. Significantly motion degraded exam. 2. 7 mm acute infarct within the right pons. 3. Chronic right thalamic lacunar infarct. 4. Background moderate cerebral atrophy and moderately severe cerebral white matter chronic small vessel ischemic disease. MRA head: 1. Mild to moderately motion degraded exam. 2. Paucity of flow-related signal arising from the intracranial right vertebral artery. This vessel is likely occluded. 3. The basilar artery is patent with mild atherosclerotic irregularity. 4. Additional apparent intracranial stenoses as follows (please note these apparent stenoses may be exaggerated by motion artifact on the current exam). 5. Apparent high-grade stenosis within the proximal cavernous left ICA. 6. Apparent moderate stenosis within the distal M1 left MCA. 7. Apparent high-grade stenoses within bilateral proximal to mid M2 MCA branches. 8. Apparent moderate stenosis with proximal A2 left ACA. 9. Apparent multifocal severe stenoses within the P2 right PCA. 10. Apparent moderate focal stenosis within the proximal P2 left PCA. 11. 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 Electronically Signed: By: Kellie Simmering DO On: 10/11/2020 17:35   DG Knee Complete 4 Views Left  Result Date: 10/11/2020 CLINICAL DATA:  Pain.  Recent falls EXAM: LEFT KNEE - COMPLETE 4+ VIEW COMPARISON:  None FINDINGS: Frontal, lateral, and bilateral oblique views were obtained. No fracture or dislocation. No  joint effusion. There is mild narrowing of the patellofemoral joint. Other joint  spaces appear unremarkable. There is chondrocalcinosis. No erosion. IMPRESSION: Chondrocalcinosis, a finding that may be seen with osteoarthritis or calcium pyrophosphate deposition disease. Mild narrowing patellofemoral joint. Other joint spaces appear unremarkable. No evident fracture, dislocation, or joint effusion. Electronically Signed   By: Lowella Grip III M.D.   On: 10/11/2020 13:40   DG Femur Min 2 Views Left  Result Date: 10/11/2020 CLINICAL DATA:  Pain following recent fall EXAM: LEFT FEMUR 2 VIEWS COMPARISON:  None. FINDINGS: Frontal and lateral views were obtained. There is no fracture or dislocation. No abnormal periosteal reaction. There is chondrocalcinosis in the knee region. There is slight narrowing of the left hip joint. There are scattered foci of arterial vascular calcification. IMPRESSION: No fracture or dislocation. Mild narrowing left hip joint. Chondrocalcinosis noted in the left knee joint. Scattered foci of arterial vascular calcification. Electronically Signed   By: Lowella Grip III M.D.   On: 10/11/2020 13:39    Procedures Procedures   Medications Ordered in ED Medications  aspirin chewable tablet 81 mg (has no administration in time range)    ED Course  I have reviewed the triage vital signs and the nursing notes.  Pertinent labs & imaging results that were available during my care of the patient were reviewed by me and considered in my medical decision making (see chart for details).    MDM Rules/Calculators/A&P                          75 year old male presents to the ED today status post mechanical fall that occurred yesterday.  Reports that he fell after his leg gave out on him landing onto his left hip.  No head injury loss of consciousness.  Required fire department to come in pick him up off the floor, did not want to be evaluated yesterday however woke up this morning with continued pain despite taking his normal tramadol prompting him to call  EMS.  Per EMS patient was able to stand and assist with getting onto stretcher without difficulty.  On exam he has no obvious ecchymosis to his left hip.  There is no obvious external rotation or leg shortening concerning for a fracture or dislocation at this time.  He does have some mild tenderness along the hip, lateral aspect of the femur, knee. He is found to not be able to lift his leg off the bed against gravity and states this has been present for 2 weeks; pt does have a history of stroke and appears to be anticoagulated on eliquis. We will plan for x-rays of the hip, femur, knee as well as CT head. If no acute findings will attempt to ambulate pt for safety prior to discharge.   CT head with concerns for age indeterminate lacunar infarct. Pt attempted to be ambulated with walker; he ws able to stand and support himself with the walker however when pivoting pt lost their footing and almost fell. Will plan for PT eval and MRI at this time with concern for recent stroke. Will plan to admit pt.   Received call from radiology - they would like to add on an MRA at this time; have given verbal agreement.   5:48 PM Received call from radiologist Dr. Armandina Gemma who reports pt has had a 7 mm acute stroke in his R pons with vertebral artery occlusion.   5:49 PM Discussed case with Dr. Rory Percy  neurologist - given leg weakness has been present for approximately 2 weeks he suspects this is more subacute than acute. He does not think pt needs emergent transfer over to [facility]. It does appear Dr. Merlene Laughter is on call for [facility] at the moment; recommends consulting him and having medicine admit for stroke work up. Does recommend holding Eliquis for today and tomorrow and to provide baby aspirin. Have discussed with Dr. Roderic Palau who is now the ED physician after shift change and aware of patient. Will also call hospitalist for admission.   Discussed case with Dr. Olevia Bowens who will evaluate patient for admission. I have not heard  from Dr. Merlene Laughter at this time; per Dr. Rory Percy he is here over the week and should be able to evaluate him tomorrow. May need to be reconsulted in the AM.   This note was prepared using Dragon voice recognition software and may include unintentional dictation errors due to the inherent limitations of voice recognition software.  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

## 2020-10-11 NOTE — H&P (Addendum)
History and Physical    ANHAD SHEELEY GBT:[id number] DOB: July 11, 1946 DOA: 10/11/2020  PCP: Vassie Moment, NP (Inactive)   Patient coming from: Home.   I have personally briefly reviewed patient's old medical records in [place]  Chief Complaint: Left leg weakness.  HPI: Rick Cruz is a 75 y.o. male with medical history significant of osteoarthritis, history of other nonhemorrhagic stroke, right eye blindness, chronic diastolic heart failure, chronic respiratory failure due to COPD on home oxygen, sleep apnea noncompliant with BiPAP, history of tobacco abuse, type II DM, hyperlipidemia, hypertension, history of unspecified schizophrenia who is coming to the emergency department due to right lower extremity weakness for the past 2 weeks.  He normally uses a walker, but has had multiple falls in the past 2 weeks.  He states that his legs feel like like is giving out.  He denies head trauma or LOC.  He denies numbness or tingling on his left side, blurred vision or slurred speech or difficulty understanding.  Fever, chills, night sweats, rhinorrhea, sore throat, chest pain, palpitations, diaphoresis, PND, orthopnea or recent pitting edema of the lower extremities.  Denies abdominal pain, nausea, vomiting, diarrhea, constipation, melena or hematochezia.  No dysuria, frequency or hematuria.  No polyuria, polydipsia or polyphagia.  ED Course: Initial vital signs were temperature 98.3 F, pulse 67, respirations 24, BP 121/77 mmHg O2 sat 100% on room on 4 LPM via nasal cannula.  Neurology was contacted who recommended to stop apixaban and begin aspirin.  The patient received an 81 mg chewable aspirin.  Labwork: CBC showed a white count of 11.5, hemoglobin 11.1 g/dL and platelets 249. PT was 15.3, INR 1.3 and PTT 32. Ethyl alcohol was less than 10 mg/dL. CMP shows a chloride of 95 and CO2 of 33 mmol/L. Glucose 120 mg/dL and albumin 3.4 g/dL. The rest of the CMP values are within expected  range.  Imaging: 2 view femora did not show any fracture or dislocation. There was mild narrowing of the left hip and chondrocalcinosis of the left knee joint. Left knee 4 view showed DJD changes. MRI brain without contrast show moderate cerebral atrophy with mild cerebellar atrophy. There was a 7 mm acute infarct within the right pons. There was a chronic right thalamic lacunar infarct. Chronic small vessel ischemic disease. MRI radial head show positive L flow related seen arising from the intracranial right vertebral artery. This vessel is likely occluded. The basilar artery is patent with mild atherosclerotic irregularity.  Review of Systems: As per HPI otherwise all other systems reviewed and are negative.  Past Medical History:  Diagnosis Date  . Arthritis   . 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 CHF (congestive heart failure) (HCC)    diastolic   . Chronic pain   . COPD (chronic obstructive pulmonary disease) ([place])   . Depression   . Diabetes mellitus without complication (HCC)    insulin pump  . Hypercholesterolemia   . Hypertension   . On home O2    3 liters  . Oxygen deficiency   . Schizophrenia ([place])   . Sleep apnea    noncompliant with BiPAP  . Sleep apnea   . Tobacco abuse     Past Surgical History:  Procedure Laterality Date  . COLON SURGERY  March 2010   secondary to large colon polyp, final path per discharge summary notes tubulovillous adenoma.   . COLONOSCOPY  July 2011   Dr. Benson Norway: multiple polyps, internal and  external hemorrhoids, diverticulosis. tubular adenoma  . ESOPHAGOGASTRODUODENOSCOPY (EGD) WITH PROPOFOL N/A 04/20/2013   Procedure: ESOPHAGOGASTRODUODENOSCOPY (EGD) WITH PROPOFOL;  Surgeon: Daneil Dolin, MD;  Location: [facility];  Service: Endoscopy;  Laterality: N/A;  . KNEE SURGERY     X2    Social History  reports that he quit smoking about 7 years ago. His smoking use included cigarettes. He has a 50.00 pack-year smoking history. He has never  used smokeless tobacco. He reports that he does not drink alcohol and does not use drugs.  Allergies  Allergen Reactions  . Phenergan [Promethazine Hcl] Other (See Comments)    Becomes very confused and aggressive  . Haldol [Haloperidol Lactate] Other (See Comments)    Shaking   . Metformin And Related Diarrhea    Family History  Problem Relation Age of Onset  . Thyroid disease Mother   . Parkinson's disease Father   . Parkinson's disease Other   . Colon cancer Neg Hx    Prior to Admission medications   Medication Sig Start Date End Date Taking? Authorizing Provider  albuterol (PROVENTIL) (2.5 MG/3ML) 0.083% nebulizer solution Take 3 mLs (2.5 mg total) by nebulization every 6 (six) hours as needed for wheezing or shortness of breath. 02/22/20  Yes Emokpae, Courage, MD  apixaban (ELIQUIS) 5 MG TABS tablet Take 1 tablet (5 mg total) by mouth 2 (two) times daily. 02/22/20  Yes Emokpae, Courage, MD  carboxymethylcellulose (REFRESH PLUS) 0.5 % SOLN Apply 1 drop to eye 3 (three) times daily as needed.   Yes [provider]  cholecalciferol (VITAMIN D) 1000 UNITS tablet Take 1,000 Units by mouth 2 (two) times daily.    Yes [provider]  Cinnamon 500 MG capsule Take 1,000 mg by mouth in the morning and at bedtime.    Yes [provider]  docusate sodium (COLACE) 100 MG capsule Take 100 mg by mouth 2 (two) times daily.   Yes [provider]  DULoxetine (CYMBALTA) 30 MG capsule Take 1 capsule (30 mg total) by mouth every morning. 02/22/20  Yes Emokpae, Courage, MD  Fluticasone-Umeclidin-Vilant (TRELEGY ELLIPTA) 100-62.5-25 MCG/INH AEPB Inhale 1 puff into the lungs daily. 02/22/20  Yes Emokpae, Courage, MD  furosemide (LASIX) 20 MG tablet Take 2 tablets (40 mg total) by mouth daily. Patient taking differently: Take 60 mg by mouth daily. 02/22/20  Yes Emokpae, Courage, MD  gabapentin (NEURONTIN) 300 MG capsule Take 1 capsule (300 mg total) by mouth 3 (three) times  daily. Patient taking differently: Take 600 mg by mouth at bedtime. 11/22/18  Yes Barton Dubois, MD  hydrOXYzine (ATARAX/VISTARIL) 25 MG tablet Take 1 tablet (25 mg total) by mouth every 12 (twelve) hours as needed for anxiety or itching (Anxiety and insomnia or restlessness). 02/22/20  Yes Emokpae, Courage, MD  insulin glargine (LANTUS SOLOSTAR) 100 UNIT/ML Solostar Pen Inject 25 Units into the skin 2 (two) times daily. Patient taking differently: Inject 20 Units into the skin at bedtime. 02/22/20  Yes Emokpae, Courage, MD  insulin lispro (HUMALOG) 100 UNIT/ML injection Inject 0-0.1 mLs (0-10 Units total) into the skin See admin instructions. Per sliding scale insulin Lispro (Humalog) injection 0-10 Units 0-10 Units Subcutaneous, 3 times daily with meals CBG 70 - 120: 0 unit CBG 121 - 150: 0 unit  CBG 151 - 200: 1 unit CBG 201 - 250: 2 units CBG 251 - 300: 4 units CBG 301 - 350: 6 units  CBG 351 - 400: 8 units  CBG > 400: 10 units  Patient taking differently: Inject 13 Units into the skin in the morning and at bedtime. 02/22/20  Yes Emokpae, Courage, MD  melatonin 3 MG TABS tablet Take 6 mg by mouth at bedtime.   Yes [provider]  Multiple Vitamins-Minerals (CENTRUM SILVER 50+MEN PO) Take 1 tablet by mouth daily.   Yes [provider]  OXYGEN Inhale 3 L into the lungs continuous.   Yes [provider]  QUEtiapine (SEROQUEL) 25 MG tablet Take 1 tablet (25 mg total) by mouth in the morning. 02/22/20  Yes Emokpae, Courage, MD  QUEtiapine (SEROQUEL) 50 MG tablet Take 1 tablet (50 mg total) by mouth at bedtime. 02/22/20  Yes Emokpae, Courage, MD  simvastatin (ZOCOR) 40 MG tablet Take 40 mg by mouth every evening.   Yes [provider]  tiotropium (SPIRIVA) 18 MCG inhalation capsule Place 1 capsule (18 mcg total) into inhaler and inhale daily. 02/22/20  Yes Emokpae, Courage, MD  traMADol (ULTRAM) 50 MG tablet Take 100 mg by mouth 2 (two) times daily as needed for moderate pain.    Yes [provider]  triamcinolone cream (KENALOG) 0.1 % Apply 1 application topically 2 (two) times daily. 02/11/20  Yes Avegno, Darrelyn Hillock, FNP  TRULICITY 3 WS/5.6CL SOPN Inject 3 mg into the skin every Friday. 06/07/20  Yes [provider]  acetaminophen (TYLENOL) 325 MG tablet Take 2 tablets (650 mg total) by mouth every 6 (six) hours as needed for mild pain, fever or headache (or Fever >/= 101). Patient not taking: No sig reported 02/22/20   Roxan Hockey, MD  blood glucose meter kit and supplies Relion Prime or Dispense other brand based on patient and insurance preference. Use up to four times daily as directed. (FOR ICD-9 250.00, 250.01). 02/22/20   Roxan Hockey, MD  blood glucose meter kit and supplies Dispense based on patient and insurance preference. Use up to four times daily as directed. (FOR ICD-9 250.00, 250.01).  --Otherwise may dispense ReliOn Prime glucometer, test strips and lancets 02/22/20   Roxan Hockey, MD  meclizine (ANTIVERT) 25 MG tablet Take 25 mg by mouth 3 (three) times daily as needed for dizziness. Patient not taking: Reported on 10/11/2020    [provider]  metoprolol tartrate (LOPRESSOR) 50 MG tablet Take 1 tablet (50 mg total) by mouth 2 (two) times daily. Patient not taking: No sig reported 02/22/20   Roxan Hockey, MD  Multiple Vitamins-Minerals (PRESERVISION AREDS 2+MULTI VIT PO) Take 2 tablets by mouth in the morning and at bedtime. Patient not taking: Reported on 10/11/2020    [provider]  pantoprazole (PROTONIX) 40 MG tablet Take 1 tablet (40 mg total) by mouth 2 (two) times daily before a meal. Patient not taking: No sig reported 02/22/20   Roxan Hockey, MD  polyethylene glycol (MIRALAX / GLYCOLAX) 17 g packet Take 17 g by mouth daily. Patient not taking: No sig reported 02/23/20   Roxan Hockey, MD  predniSONE (DELTASONE) 20 MG tablet Take 2 tablets (40 mg total) by mouth daily with breakfast. Patient not  taking: No sig reported 02/22/20   Roxan Hockey, MD  saw palmetto 160 MG capsule Take 160 mg by mouth 2 (two) times daily. Patient not taking: Reported on 10/11/2020    [provider]  triamcinolone cream (KENALOG) 0.1 % Apply 1 application topically 2 (two) times daily. Patient not taking: No sig reported 02/13/20   Lestine Box, [place]    Physical Exam: Vitals:   10/11/20 1155 10/11/20 1738 10/11/20  1910 10/11/20 1911  BP: 121/77 (!) 160/92  (!) 155/98  Pulse: 67 100 (!) 104 (!) 104  Resp: (!) $RemoveB'24 18 18 18  'VILBkrXk$ Temp: 98.3 F (36.8 C)     TempSrc: Oral     SpO2: 100% 98% 100% 100%  Weight: 107.5 kg     Height: 6' (1.829 m)       Constitutional: NAD, calm, comfortable Eyes: PERRL, lids and conjunctivae normal ENMT: Mucous membranes are moist. Posterior pharynx clear of any exudate or lesions. Neck: normal, supple, no masses, no thyromegaly Respiratory: Decreased breath sounds on bases, otherwise clear to auscultation bilaterally, no wheezing, no crackles. Normal respiratory effort. No accessory muscle use.  Cardiovascular: Tachycardia with a regular rhythm at 103 bpm, no murmurs / rubs / gallops. No extremity edema. 2+ pedal pulses. No carotid bruits.  Abdomen: Obese, no distended. Bowel sounds positive. Soft, no tenderness, no masses palpated. No hepatosplenomegaly.  Musculoskeletal: no clubbing / cyanosis. Good ROM, no contractures. Normal muscle tone.  Skin: no acute rashes, lesions, ulcers on very limited dermatological examination. Neurologic: CN 2-12 grossly intact. Sensation grossly intact, DTR normal. 1/5 weakness on LLE.  Psychiatric: Normal judgment and insight. Alert and oriented x 3. Normal mood.   Labs on Admission: I have personally reviewed following labs and imaging studies  CBC: Recent Labs  Lab 10/11/20 1545  WBC 11.5*  NEUTROABS 8.2*  HGB 11.1*  HCT 38.8*  MCV 84.0  PLT 347    Basic Metabolic Panel: Recent Labs  Lab 10/11/20 1545  NA 137  K  4.8  CL 95*  CO2 33*  GLUCOSE 120*  BUN 14  CREATININE 1.11  CALCIUM 9.1    GFR: Estimated Creatinine Clearance: 75.1 mL/min (by C-G formula based on SCr of 1.11 mg/dL).  Liver Function Tests: Recent Labs  Lab 10/11/20 1545  AST 22  ALT 19  ALKPHOS 82  BILITOT 0.4  PROT 6.7  ALBUMIN 3.4*   Radiological Exams on Admission: DG Pelvis 1-2 Views  Result Date: 10/11/2020 CLINICAL DATA:  Left hip soreness.  History of falls. EXAM: PELVIS - 1-2 VIEW COMPARISON:  Abdomen 05/23/2016.  Pelvis 12/05/2014. FINDINGS: Stable deformity right ilium. Degenerative changes lumbar spine and both hips. No acute bony or joint abnormality identified. Aortoiliac atherosclerotic vascular calcification. Pelvic calcifications consistent phleboliths. Ill-defined density noted over the lower pelvis/perineum, most likely external to the patient, possibly a bandage. IMPRESSION: 1. Degenerative changes lumbar spine and both hips. No acute bony abnormality identified. 2. Aortoiliac atherosclerotic vascular disease. Electronically Signed   By: Marcello Moores  Register   On: 10/11/2020 13:39   CT Head Wo Contrast  Result Date: 10/11/2020 CLINICAL DATA:  Left leg weakness EXAM: CT HEAD WITHOUT CONTRAST TECHNIQUE: Contiguous axial images were obtained from the base of the skull through the vertex without intravenous contrast. COMPARISON:  CT brain 02/18/2020, brain MRI 04/27/2019 FINDINGS: Brain: No acute territorial infarction, hemorrhage, or intracranial mass. Moderate atrophy. Moderate hypodensity in the white matter consistent with chronic small vessel ischemic change. Possible age indeterminate lacunar infarct in the right thalamus. Stable ventricle size. Vascular: No hyperdense vessels.  Carotid vascular calcification Skull: Normal. Negative for fracture or focal lesion. Sinuses/Orbits: No acute finding. Other: None IMPRESSION: 1. Possible age indeterminate lacunar infarct in the right thalamus. Otherwise no CT evidence for  acute intracranial abnormality. 2. Atrophy and chronic small vessel ischemic changes of the white matter. Electronically Signed   By: Donavan Foil M.D.   On: 10/11/2020 15:36   MR ANGIO  HEAD WO CONTRAST  Addendum Date: 10/11/2020   ADDENDUM REPORT: 10/11/2020 17:38 ADDENDUM: Findings of acute right pontine infarct and 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 called by telephone at the time of interpretation on 10/11/2020 at 5:38 pm to provider PA Alroy Bailiff, who verbally acknowledged these results. Electronically Signed   By: Kellie Simmering DO   On: 10/11/2020 17:38   Result Date: 10/11/2020 CLINICAL DATA:  Stroke, follow-up. Additional history provided: Fatigue, bilateral weakness for 2 days, no known injury. EXAM: MRI HEAD WITHOUT CONTRAST MRA HEAD WITHOUT CONTRAST TECHNIQUE: Multiplanar, multiecho pulse sequences of the brain and surrounding structures were obtained without intravenous contrast. Angiographic images of the head were obtained using MRA technique without contrast. COMPARISON:  Head CT 10/11/2020. brain MRI 04/27/2019. FINDINGS: MRI HEAD FINDINGS Brain: Multiple sequences are significantly motion degraded, limiting evaluation. Most notably, there is moderate motion degradation of the sagittal T1 weighted sequence, mild/moderate motion degradation of the axial T2 weighted sequence, severe motion degradation of the axial T2* sequence, severe motion degradation of the axial T1 weighted sequence, moderate motion degradation of the coronal T2 weighted sequence and moderate motion degradation of the axial T2/FLAIR sequence. Moderate cerebral atrophy.  Comparatively mild cerebellar atrophy. 7 mm acute infarct within the right pons (series 5, image 11) (series 7, image 17). Chronic right thalamic lacunar infarct. Moderately severe multifocal T2/FLAIR hyperintensity within the cerebral white matter is nonspecific, but compatible with chronic small vessel ischemic disease. No evidence of intracranial  mass. No chronic intracranial blood products. No extra-axial fluid collection. No midline shift. Vascular: Reported below. Skull and upper cervical spine: No focal marrow lesion. Sinuses/Orbits: Visualized orbits show no acute finding. No significant paranasal sinus disease. Other: Trace fluid within the left mastoid air cells. MRA HEAD FINDINGS Mild to moderately motion degraded exam. The intracranial internal carotid arteries are patent. Apparent high-grade stenosis within the proximal cavernous left ICA, which could be exaggerated by motion artifact on the current exam. The M1 middle cerebral arteries are patent. Apparent moderate stenosis within the distal M1 left middle cerebral artery. Additionally, there are apparent high-grade stenoses within bilateral proximal to mid M2 MCA branches. The A1 left ACA is developmentally hypoplastic or absent. The anterior cerebral arteries are otherwise patent. Apparent moderate stenosis within the proximal A2 left ACA. 1-2 mm inferiorly projecting vascular protrusion arising from the paraclinoid right ICA which may reflect an aneurysm or infundibulum (series 1045, image 15). There is a marked paucity of flow related signal arising from the intracranial right vertebral artery and this vessel is likely occluded. The left vertebral artery is patent without significant stenosis. The basilar artery is patent with mild atherosclerotic irregularity. The posterior cerebral arteries are patent. Apparent multifocal severe stenoses within the P2 right posterior cerebral artery. Apparent multifocal. Apparent moderate focal stenosis within the proximal P2 left PCA. 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 A left posterior communicating artery is present. The right posterior communicating artery is hypoplastic or absent. IMPRESSION: MRI brain: 1. Significantly motion degraded exam. 2. 7 mm acute infarct within the right pons. 3. Chronic right thalamic lacunar  infarct. 4. Background moderate cerebral atrophy and moderately severe cerebral white matter chronic small vessel ischemic disease. MRA head: 1. Mild to moderately motion degraded exam. 2. Paucity of flow-related signal arising from the intracranial right vertebral artery. This vessel is likely occluded. 3. The basilar artery is patent with mild atherosclerotic irregularity. 4. Additional apparent intracranial stenoses as follows (please note these apparent  stenoses may be exaggerated by motion artifact on the current exam). 5. Apparent high-grade stenosis within the proximal cavernous left ICA. 6. Apparent moderate stenosis within the distal M1 left MCA. 7. Apparent high-grade stenoses within bilateral proximal to mid M2 MCA branches. 8. Apparent moderate stenosis with proximal A2 left ACA. 9. Apparent multifocal severe stenoses within the P2 right PCA. 10. Apparent moderate focal stenosis within the proximal P2 left PCA. 11. 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 Electronically Signed   By: Jackey Loge DO   On: 10/11/2020 17:38   Result Date: 10/11/2020 CLINICAL DATA:  Stroke, follow-up. Additional history provided: Fatigue, bilateral weakness for 2 days, no known injury. EXAM: MRI HEAD WITHOUT CONTRAST MRA HEAD WITHOUT CONTRAST TECHNIQUE: Multiplanar, multiecho pulse sequences of the brain and surrounding structures were obtained without intravenous contrast. Angiographic images of the head were obtained using MRA technique without contrast. COMPARISON:  Head CT 10/11/2020. brain MRI 04/27/2019.  FINDINGS: MRI HEAD FINDINGS Brain: Multiple sequences are significantly motion degraded, limiting evaluation. Most notably, there is moderate motion degradation of the sagittal T1 weighted sequence, mild/moderate motion degradation of the axial T2 weighted sequence, severe motion degradation of the axial T2* sequence, severe motion degradation of the axial T1 weighted sequence, moderate motion degradation of the coronal T2 weighted sequence and moderate motion degradation of the axial T2/FLAIR sequence. Moderate cerebral atrophy.  Comparatively mild cerebellar atrophy. 7 mm acute infarct within the right pons (series 5, image 11) (series 7, image 17). Chronic right thalamic lacunar infarct. Moderately severe multifocal T2/FLAIR hyperintensity within the cerebral white matter is nonspecific, but compatible with chronic small vessel ischemic disease. No evidence of intracranial mass. No chronic intracranial blood products. No extra-axial fluid collection. No midline shift. Vascular: Reported below. Skull and upper cervical spine: No focal marrow lesion. Sinuses/Orbits: Visualized orbits show no acute finding. No significant paranasal sinus disease. Other: Trace fluid within the left mastoid air cells. MRA HEAD FINDINGS Mild to moderately motion degraded exam. The intracranial internal carotid arteries are patent. Apparent high-grade stenosis within the proximal cavernous left ICA, which could be exaggerated by motion artifact on the current exam. The M1 middle cerebral arteries are patent. Apparent moderate stenosis within the distal M1 left middle cerebral artery. Additionally, there are apparent high-grade stenoses within bilateral proximal to mid M2 MCA branches. The A1 left ACA is developmentally hypoplastic or absent. The anterior cerebral arteries are otherwise patent. Apparent moderate stenosis within the proximal A2 left ACA. 1-2 mm inferiorly projecting vascular protrusion arising from the paraclinoid right  ICA which may reflect an aneurysm or infundibulum (series 1045, image 15). There is a marked paucity of flow related signal arising from the intracranial right vertebral artery and this vessel is likely occluded. The left vertebral artery is patent without significant stenosis. The basilar artery is patent with mild atherosclerotic irregularity. The posterior cerebral arteries are patent. Apparent multifocal severe stenoses within the P2 right posterior cerebral artery. Apparent multifocal. Apparent moderate focal stenosis within the proximal P2 left PCA. 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 A left posterior communicating  artery is present. The right posterior communicating artery is hypoplastic or absent. IMPRESSION: MRI brain: 1. Significantly motion degraded exam. 2. 7 mm acute infarct within the right pons. 3. Chronic right thalamic lacunar infarct. 4. Background moderate cerebral atrophy and moderately severe cerebral white matter chronic small vessel ischemic disease. MRA head: 1. Mild to moderately motion degraded exam. 2. Paucity of flow-related signal arising from the intracranial right vertebral artery. This vessel is likely occluded. 3. The basilar artery is patent with mild atherosclerotic irregularity. 4. Additional apparent intracranial stenoses as follows (please note these apparent stenoses may be exaggerated by motion artifact on the current exam). 5. Apparent high-grade stenosis within the proximal cavernous left ICA. 6. Apparent moderate stenosis within the distal M1 left MCA. 7. Apparent high-grade stenoses within bilateral proximal to mid M2 MCA branches. 8. Apparent moderate stenosis with proximal A2 left ACA. 9. Apparent multifocal severe stenoses within the P2 right PCA. 10. Apparent moderate focal stenosis within the proximal P2 left PCA. 11. 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 Electronically Signed: By: Kellie Simmering DO On:  10/11/2020 17:35   DG Knee Complete 4 Views Left  Result Date: 10/11/2020 CLINICAL DATA:  Pain.  Recent falls EXAM: LEFT KNEE - COMPLETE 4+ VIEW COMPARISON:  None FINDINGS: Frontal, lateral, and bilateral oblique views were obtained. No fracture or dislocation. No joint effusion. There is mild narrowing of the patellofemoral joint. Other joint spaces appear unremarkable. There is chondrocalcinosis. No erosion. IMPRESSION: Chondrocalcinosis, a finding that may be seen with osteoarthritis or calcium pyrophosphate deposition disease. Mild narrowing patellofemoral joint. Other joint spaces appear unremarkable. No evident fracture, dislocation, or joint effusion. Electronically Signed   By: Lowella Grip III M.D.   On: 10/11/2020 13:40   DG Femur Min 2 Views Left  Result Date: 10/11/2020 CLINICAL DATA:  Pain following recent fall EXAM: LEFT FEMUR 2 VIEWS COMPARISON:  None. FINDINGS: Frontal and lateral views were obtained. There is no fracture or dislocation. No abnormal periosteal reaction. There is chondrocalcinosis in the knee region. There is slight narrowing of the left hip joint. There are scattered foci of arterial vascular calcification. IMPRESSION: No fracture or dislocation. Mild narrowing left hip joint. Chondrocalcinosis noted in the left knee joint. Scattered foci of arterial vascular calcification. Electronically Signed   By: Lowella Grip III M.D.   On: 10/11/2020 13:39    EKG: Independently reviewed.  Assessment/Plan Principal Problem:   Ischemic cerebrovascular accident (CVA) ([place]) Observation/telemetry. Frequent neurochecks. Swallow screen. PT/OT/SLP. Consult TOC team. Check fasting lipids. Obtain hemoglobin A1c. Check carotid Doppler. Obtain echocardiogram. Apixaban held per neuro request. Continue aspirin 81 mg p.o. daily. Consult neurology.  Active Problems:   Essential hypertension Allow permissive hypertension. Monitor blood pressure and heart rate.    Type  2 diabetes mellitus without complication,    with long-term current use of insulin (HCC) Carbohydrate modified diet. Continue Lantus 20 units at bedtime. CBG monitoring with RI SS. Check hemoglobin A1c.    Chronic respiratory failure 02 dep with hypercarbia Continue supplemental oxygen. Continue Trelegy Ellipta daily. Continuous Spiriva daily. Rescue MDI or nebs as needed.    Chronic diastolic CHF (congestive heart failure) (HCC) No signs of decompensation. Holding beta-blocker.    Paroxysmal atrial fibrillation (HCC) CHA?DS?-VASc Score of at least 5. Off apixaban due to CVA. Off metoprolol to allow permissive hypertension. Continue cardiac telemetry.    Paranoid schizophrenia (HCC) Continue Seroquel 25 mg in the morning. Continue Seroquel 50 mg at bedtime.  On duloxetine for depression. Continue hydroxyzine 25 mg every 12 hours for anxiety.    Depression Continue duloxetine 30 mg p.o. daily.    Diabetic neuropathy (HCC) Continue gabapentin 600 mg p.o. bedtime.    Dyslipidemia Continue simvastatin 40 p.o. daily.    Gastroesophageal reflux disease   Hypoalbuminemia Consult nutritional services.    Class 1 obesity Lifestyle modifications. Follow-up with PCP.    Iron deficiency anemia Likely from chronic blood loss. Monitor H&H. Consider GI evaluation as outpatient.    Obstructive sleep apnea Not on CPAP.   DVT prophylaxis: SCDs. Code Status:   Full code. Family Communication:   Disposition Plan:   Patient is from:  Home.  Anticipated DC to:  Home.  Anticipated DC date:  10/13/2020.  Anticipated DC barriers: Clinical status.  Consults called:  PT/OT/SLP and TOC. Admission status:  Observation/telemetry.  Severity of Illness:  High due to right lower extremity weakness secondary to new right pons area CVA. The patient will need to remain in the hospital for further evaluation and stroke work-up.  Reubin Milan MD [facility]  How to contact  the [facility] Attending or Consulting provider [place] or covering provider during after hours [place], for this patient?   1. Check the care team in [facility] and look for a) attending/consulting [facility] provider listed and b) the [facility] team listed 2. Log into [email or web address] and use 's universal password to access. If you do not have the password, please contact the hospital operator. 3. Locate the [facility] provider you are looking for under [facility] and page to a number that you can be directly reached. 4. If you still have difficulty reaching the provider, please page the [facility] (Director on Call) for the Hospitalists listed on amion for assistance.  10/11/2020, 8:04 PM   This document was prepared using Dragon voice recognition software and may contain some unintended transcription errors.

## 2020-10-11 NOTE — ED Notes (Signed)
Patient transported to MRI 

## 2020-10-11 NOTE — Evaluation (Signed)
Physical Therapy Evaluation Patient Details Name: Rick Cruz MRN: [id number] DOB: 1946/07/26 Today's Date: 10/11/2020   History of Present Illness  Pt brought in by EMS due to left hip soreness. EMS reports pt has had several falls due to left leg giving out. Last fall yesterday. Reports pain is mild at this time. EMS reports pt able to stand and assist with getting on stretcher    Clinical Impression  Patient functioning near baseline for functional mobility but is limited for functional mobility as stated below secondary to BLE weakness, balance deficits and impaired activity tolerance. Patient requires cueing for sequencing with bed mobility and use of bed rails to transition to seated EOB. Patient demonstrates fair sitting tolerance with use of UE support required for balance. He transfers to standing with use of RW and is slightly unsteady but without loss of balance. Patient expresses his frustrations about wishing to return home and requires coaxing to ambulate a few steps at bedside. Patient left with nursing staff present to assist him with bathing. Based on what patient describes as PLOF, he overall appears to be functioning at baseline and is typically assisted with ADL by aids and family. Patient educated on RW use upon returning home to improve base of support and reduce the risk of falling. Patient will benefit from continued physical therapy in hospital and recommended venue below to increase strength, balance, endurance for safe ADLs and gait.     Follow Up Recommendations Home health PT;Supervision/Assistance - 24 hour;Supervision for mobility/OOB    Equipment Recommendations  None recommended by PT    Recommendations for Other Services       Precautions / Restrictions Precautions Precautions: Fall Restrictions Weight Bearing Restrictions: No      Mobility  Bed Mobility Overal bed mobility: Needs Assistance Bed Mobility: Supine to Sit;Sit to Supine     Supine to  sit: Min guard Sit to supine: Supervision   General bed mobility comments: slow, labored, use of bedrails, given cueing for sequencing    Transfers Overall transfer level: Needs assistance Equipment used: Rolling walker (2 wheeled) Transfers: Sit to/from [employer] Sit to Stand: Min guard Stand pivot transfers: Min guard       General transfer comment: use of RW, guard for safety, slightly unsteady upon standing  Ambulation/Gait Ambulation/Gait assistance: Min guard Gait Distance (Feet): 4 Feet Assistive device: Rolling walker (2 wheeled)       General Gait Details: slow, labored gait with RW, limited by frustration with wanting to leave  Stairs            Wheelchair Mobility    Modified Rankin (Stroke Patients Only)       Balance Overall balance assessment: Needs assistance Sitting-balance support: Bilateral upper extremity supported Sitting balance-Leahy Scale: Fair     Standing balance support: Bilateral upper extremity supported Standing balance-Leahy Scale: Fair Standing balance comment: with RW                             Pertinent Vitals/Pain      Home Living Family/patient expects to be discharged to:: Private residence Living Arrangements: Parent Available Help at Discharge: Family;Available PRN/intermittently;Personal care attendant (Aid assists from 8-2 everyday) Type of Home: House Home Access: Ramped entrance     Home Layout: One level Home Equipment: Walker - 2 wheels;Walker - 4 wheels      Prior Function Level of Independence: Needs assistance   Gait / Transfers  Assistance Needed: usually walked household distances with 2 wheeled walker  ADL's / Homemaking Assistance Needed: Independent for BADLs; assistance for IADLs from aid        Hand Dominance        Extremity/Trunk Assessment   Upper Extremity Assessment Upper Extremity Assessment: Generalized weakness    Lower Extremity  Assessment Lower Extremity Assessment: Generalized weakness    Cervical / Trunk Assessment Cervical / Trunk Assessment: Normal  Communication   Communication: No difficulties  Cognition Arousal/Alertness: Awake/alert Behavior During Therapy: Agitated;Impulsive Overall Cognitive Status: Within Functional Limits for tasks assessed                                        General Comments      Exercises     Assessment/Plan    PT Assessment All further PT needs can be met in the next venue of care  PT Problem List Decreased strength;Decreased activity tolerance;Decreased balance;Decreased mobility;Decreased knowledge of use of DME;Decreased safety awareness;Pain       PT Treatment Interventions      PT Goals (Current goals can be found in the Care Plan section)  Acute Rehab PT Goals Patient Stated Goal: go home PT Goal Formulation: With patient Time For Goal Achievement: 10/14/20 Potential to Achieve Goals: Good    Frequency     Barriers to discharge        Co-evaluation               AM-PAC PT "6 Clicks" Mobility  Outcome Measure Help needed turning from your back to your side while in a flat bed without using bedrails?: None Help needed moving from lying on your back to sitting on the side of a flat bed without using bedrails?: None Help needed moving to and from a bed to a chair (including a wheelchair)?: A Little Help needed standing up from a chair using your arms (e.g., wheelchair or bedside chair)?: A Little Help needed to walk in hospital room?: A Little Help needed climbing 3-5 steps with a railing? : A Lot 6 Click Score: 19    End of Session Equipment Utilized During Treatment: Gait belt;Oxygen Activity Tolerance: Patient tolerated treatment well;Other (comment) (limited secondary to wanting to leave) Patient left: in bed;with nursing/sitter in room Nurse Communication: Mobility status PT Visit Diagnosis: Unsteadiness on feet  (R26.81);Other abnormalities of gait and mobility (R26.89);Repeated falls (R29.6);Muscle weakness (generalized) (M62.81)    Time: [id number] PT Time Calculation (min) (ACUTE ONLY): 16 min   Charges:   PT Evaluation $PT Eval Low Complexity: 1 Low          4:10 PM, 10/11/20 Mearl Latin PT, DPT Physical Therapist at [facility]

## 2020-10-11 NOTE — ED Notes (Signed)
Beeped to [phone].[place]

## 2020-10-11 NOTE — ED Notes (Signed)
Pt to X-Ray at this time.

## 2020-10-11 NOTE — ED Provider Notes (Signed)
°  Face-to-face evaluation   History: Patient is here for evaluation of multiple falls and left leg pain.  He states his last fall was yesterday.  He states he lives with a family member, and usually uses a walker when he ambulates.  He is unsure why he has to use a walker.  He denies recent illnesses including fever, cough, chest pain, focal weakness or paresthesia.  He uses oxygen, "most of the time."  He states he has not seen his doctor recently.  Physical exam: Elderly male, alert and obese.  He is able to sit up on his own with some slight assistance but is able to hold himself in the seated position by grasping the stretcher railings.  He is unable to lift the left leg off the stretcher.  When I assist him, he cannot maintain his leg off the bed.  The left leg seems very weak, and discoordinated.  Right leg has good extension strength.  Upper extremities have reasonable strength, bilaterally.  Medical screening examination/treatment/procedure(s) were conducted as a shared visit with non-physician practitioner(s) and myself.  I personally evaluated the patient during the encounter  I provided a substantive portion of the care of this patient.  I personally performed the entirety of the history for this encounter.       Daleen Bo, MD 10/12/20 [phone]

## 2020-10-11 NOTE — ED Triage Notes (Signed)
Pt brought in by EMS due to left hip soreness. EMS reports pt has had several falls due to left leg giving out. Last fall yesterday. Reports pain is mild at this time. EMS reports pt able to stand and assist with getting on stretcher

## 2020-10-11 NOTE — ED Notes (Signed)
Attempted to ambulate pt. Pt. Was able to pull themselves up to sitting position. Pt. Was also able to stand and support themselves with a walker. When pt. Went to pivot they lost their footing and almost fell.

## 2020-10-12 ENCOUNTER — Observation Stay (HOSPITAL_COMMUNITY): Payer: Medicare Other

## 2020-10-12 DIAGNOSIS — I639 Cerebral infarction, unspecified: Secondary | ICD-10-CM | POA: Diagnosis not present

## 2020-10-12 DIAGNOSIS — Z6832 Body mass index (BMI) 32.0-32.9, adult: Secondary | ICD-10-CM | POA: Diagnosis not present

## 2020-10-12 DIAGNOSIS — I63211 Cerebral infarction due to unspecified occlusion or stenosis of right vertebral arteries: Secondary | ICD-10-CM | POA: Diagnosis present

## 2020-10-12 DIAGNOSIS — G4733 Obstructive sleep apnea (adult) (pediatric): Secondary | ICD-10-CM | POA: Diagnosis present

## 2020-10-12 DIAGNOSIS — R296 Repeated falls: Secondary | ICD-10-CM | POA: Diagnosis present

## 2020-10-12 DIAGNOSIS — M1990 Unspecified osteoarthritis, unspecified site: Secondary | ICD-10-CM | POA: Diagnosis present

## 2020-10-12 DIAGNOSIS — I6523 Occlusion and stenosis of bilateral carotid arteries: Secondary | ICD-10-CM | POA: Diagnosis not present

## 2020-10-12 DIAGNOSIS — I480 Paroxysmal atrial fibrillation: Secondary | ICD-10-CM | POA: Diagnosis present

## 2020-10-12 DIAGNOSIS — E1140 Type 2 diabetes mellitus with diabetic neuropathy, unspecified: Secondary | ICD-10-CM | POA: Diagnosis present

## 2020-10-12 DIAGNOSIS — G2401 Drug induced subacute dyskinesia: Secondary | ICD-10-CM | POA: Diagnosis not present

## 2020-10-12 DIAGNOSIS — G8929 Other chronic pain: Secondary | ICD-10-CM | POA: Diagnosis present

## 2020-10-12 DIAGNOSIS — I6389 Other cerebral infarction: Secondary | ICD-10-CM | POA: Diagnosis not present

## 2020-10-12 DIAGNOSIS — N390 Urinary tract infection, site not specified: Secondary | ICD-10-CM | POA: Diagnosis present

## 2020-10-12 DIAGNOSIS — D500 Iron deficiency anemia secondary to blood loss (chronic): Secondary | ICD-10-CM | POA: Diagnosis present

## 2020-10-12 DIAGNOSIS — R531 Weakness: Secondary | ICD-10-CM | POA: Diagnosis present

## 2020-10-12 DIAGNOSIS — I110 Hypertensive heart disease with heart failure: Secondary | ICD-10-CM | POA: Diagnosis present

## 2020-10-12 DIAGNOSIS — E871 Hypo-osmolality and hyponatremia: Secondary | ICD-10-CM | POA: Diagnosis present

## 2020-10-12 DIAGNOSIS — Z9981 Dependence on supplemental oxygen: Secondary | ICD-10-CM | POA: Diagnosis not present

## 2020-10-12 DIAGNOSIS — Z20822 Contact with and (suspected) exposure to covid-19: Secondary | ICD-10-CM | POA: Diagnosis present

## 2020-10-12 DIAGNOSIS — F32A Depression, unspecified: Secondary | ICD-10-CM | POA: Diagnosis present

## 2020-10-12 DIAGNOSIS — E785 Hyperlipidemia, unspecified: Secondary | ICD-10-CM | POA: Diagnosis present

## 2020-10-12 DIAGNOSIS — I5032 Chronic diastolic (congestive) heart failure: Secondary | ICD-10-CM | POA: Diagnosis present

## 2020-10-12 DIAGNOSIS — G8314 Monoplegia of lower limb affecting left nondominant side: Secondary | ICD-10-CM | POA: Diagnosis present

## 2020-10-12 DIAGNOSIS — H5461 Unqualified visual loss, right eye, normal vision left eye: Secondary | ICD-10-CM | POA: Diagnosis present

## 2020-10-12 DIAGNOSIS — Z8673 Personal history of transient ischemic attack (TIA), and cerebral infarction without residual deficits: Secondary | ICD-10-CM | POA: Diagnosis not present

## 2020-10-12 DIAGNOSIS — F200 Paranoid schizophrenia: Secondary | ICD-10-CM | POA: Diagnosis present

## 2020-10-12 DIAGNOSIS — E668 Other obesity: Secondary | ICD-10-CM | POA: Diagnosis present

## 2020-10-12 DIAGNOSIS — J449 Chronic obstructive pulmonary disease, unspecified: Secondary | ICD-10-CM | POA: Diagnosis present

## 2020-10-12 DIAGNOSIS — J9612 Chronic respiratory failure with hypercapnia: Secondary | ICD-10-CM | POA: Diagnosis present

## 2020-10-12 LAB — LIPID PANEL
Cholesterol: 165 mg/dL (ref 0–200)
HDL: 39 mg/dL — ABNORMAL LOW (ref >40)
LDL Cholesterol: 88 mg/dL (ref 0–99)
Total CHOL/HDL Ratio: 4.2 RATIO
Triglycerides: 190 mg/dL — ABNORMAL HIGH (ref <150)
VLDL: 38 mg/dL (ref 0–40)

## 2020-10-12 LAB — CBC
HCT: 41.8 % (ref 39.0–52.0)
Hemoglobin: 12.1 g/dL — ABNORMAL LOW (ref 13.0–17.0)
MCH: 24.6 pg — ABNORMAL LOW (ref 26.0–34.0)
MCHC: 28.9 g/dL — ABNORMAL LOW (ref 30.0–36.0)
MCV: 85 fL (ref 80.0–100.0)
Platelets: 249 10*3/uL (ref 150–400)
RBC: 4.92 MIL/uL (ref 4.22–5.81)
RDW: 15.2 % (ref 11.5–15.5)
WBC: 10.4 10*3/uL (ref 4.0–10.5)
nRBC: 0 % (ref 0.0–0.2)

## 2020-10-12 LAB — SARS CORONAVIRUS 2 (TAT 6-24 HRS): SARS Coronavirus 2: NEGATIVE

## 2020-10-12 LAB — BASIC METABOLIC PANEL
Anion gap: 10 (ref 5–15)
BUN: 14 mg/dL (ref 8–23)
CO2: 32 mmol/L (ref 22–32)
Calcium: 9.4 mg/dL (ref 8.9–10.3)
Chloride: 94 mmol/L — ABNORMAL LOW (ref 98–111)
Creatinine, Ser: 0.98 mg/dL (ref 0.61–1.24)
GFR, Estimated: >60 mL/min (ref >60)
Glucose, Bld: 108 mg/dL — ABNORMAL HIGH (ref 70–99)
Potassium: 4.2 mmol/L (ref 3.5–5.1)
Sodium: 136 mmol/L (ref 135–145)

## 2020-10-12 LAB — GLUCOSE, CAPILLARY
Glucose-Capillary: 143 mg/dL — ABNORMAL HIGH (ref 70–99)
Glucose-Capillary: 185 mg/dL — ABNORMAL HIGH (ref 70–99)
Glucose-Capillary: 137 mg/dL — ABNORMAL HIGH (ref 70–99)
Glucose-Capillary: 129 mg/dL — ABNORMAL HIGH (ref 70–99)

## 2020-10-12 LAB — ECHOCARDIOGRAM COMPLETE
Area-P 1/2: 2.78 cm2
Height: 72 in
S' Lateral: 3.2 cm
Weight: 3792 oz

## 2020-10-12 LAB — HEMOGLOBIN A1C
Hgb A1c MFr Bld: 6.7 % — ABNORMAL HIGH (ref 4.8–5.6)
Mean Plasma Glucose: 145.59 mg/dL

## 2020-10-12 MED ORDER — ENOXAPARIN SODIUM 40 MG/0.4ML ~~LOC~~ SOLN
40.0000 mg | SUBCUTANEOUS | Status: DC
  Administered 2020-10-12: 40 mg via SUBCUTANEOUS
  Filled 2020-10-12 (×2): qty 0.4

## 2020-10-12 MED ORDER — ASPIRIN 81 MG PO CHEW
81.0000 mg | CHEWABLE_TABLET | Freq: Every day | ORAL | Status: DC
  Administered 2020-10-12 – 2020-10-18 (×5): 81 mg via ORAL
  Filled 2020-10-12 (×7): qty 1

## 2020-10-12 NOTE — Evaluation (Signed)
Physical Therapy Evaluation Patient Details Name: Rick Cruz MRN: [id number] DOB: 09-24-1945 Today's Date: 10/12/2020   History of Present Illness  Rick Cruz is a 75 y.o. male with medical history significant of osteoarthritis, history of other nonhemorrhagic stroke, right eye blindness, chronic diastolic heart failure, chronic respiratory failure due to COPD on home oxygen, sleep apnea noncompliant with BiPAP, history of tobacco abuse, type II DM, hyperlipidemia, hypertension, history of unspecified schizophrenia who is coming to the emergency department due to right lower extremity weakness for the past 2 weeks.  He normally uses a walker, but has had multiple falls in the past 2 weeks.  He states that his legs feel like like is giving out.  He denies head trauma or LOC.  He denies numbness or tingling on his left side, blurred vision or slurred speech or difficulty understanding.  Fever, chills, night sweats, rhinorrhea, sore throat, chest pain, palpitations, diaphoresis, PND, orthopnea or recent pitting edema of the lower extremities.  Denies abdominal pain, nausea, vomiting, diarrhea, constipation, melena or hematochezia.  No dysuria, frequency or hematuria.  No polyuria, polydipsia or polyphagia.    Clinical Impression  Patient presents with LLE weakness, unable to lift through full ROM when seated, able to take steps with near normal dorsiflexion without loss of balance when taking steps using RW, limited mostly due to c/o fatigue while on 4 LPM O2.  Patient tolerated sitting up in chair after therapy - RN notified.  Patient will benefit from continued physical therapy in hospital and recommended venue below to increase strength, balance, endurance for safe ADLs and gait.     Follow Up Recommendations Home health PT;Supervision for mobility/OOB;Supervision/Assistance - 24 hour    Equipment Recommendations  None recommended by PT    Recommendations for Other Services        Precautions / Restrictions Precautions Precautions: Fall Restrictions Weight Bearing Restrictions: No      Mobility  Bed Mobility Overal bed mobility: Needs Assistance Bed Mobility: Supine to Sit;Rolling Rolling: Supervision   Supine to sit: Min guard     General bed mobility comments: increased time, labored movement    Transfers Overall transfer level: Needs assistance Equipment used: Rolling walker (2 wheeled) Transfers: Sit to/from [employer] Sit to Stand: Min guard Stand pivot transfers: Min guard;Min assist       General transfer comment: increased time, labored movement  Ambulation/Gait Ambulation/Gait assistance: Min assist Gait Distance (Feet): 5 Feet Assistive device: Rolling walker (2 wheeled) Gait Pattern/deviations: Decreased step length - right;Decreased step length - left;Decreased stance time - left;Decreased stride length;Decreased dorsiflexion - left Gait velocity: decreased   General Gait Details: limited to 5-6 slow labored steps with slightly decreased dorsiflexion left foot and decreased left stride length, limited mostly due to c/o fatigue while on 4 LPM O2  Stairs            Wheelchair Mobility    Modified Rankin (Stroke Patients Only)       Balance Overall balance assessment: Needs assistance Sitting-balance support: Feet supported;No upper extremity supported Sitting balance-Leahy Scale: Fair Sitting balance - Comments: fair/good seated EOB, leans backward when putting on socks Postural control: Posterior lean Standing balance support: During functional activity;Bilateral upper extremity supported Standing balance-Leahy Scale: Fair Standing balance comment: with RW                             Pertinent Vitals/Pain Pain Assessment: No/denies pain  Home Living Family/patient expects to be discharged to:: Private residence Living Arrangements: Parent Available Help at Discharge:  Family;Available PRN/intermittently;Personal care attendant Type of Home: House Home Access: Ramped entrance     Home Layout: One level Home Equipment: Walker - 2 wheels;Walker - 4 wheels Additional Comments: lift chair    Prior Function Level of Independence: Needs assistance   Gait / Transfers Assistance Needed: usually walked household distances with 2 wheeled walker  ADL's / Homemaking Assistance Needed: Independent for BADLs; assistance for IADLs from aid, home aides 4 hours/day x 5 days/week        Hand Dominance   Dominant Hand: Right    Extremity/Trunk Assessment   Upper Extremity Assessment Upper Extremity Assessment: Defer to OT evaluation    Lower Extremity Assessment Lower Extremity Assessment: Generalized weakness;RLE deficits/detail;LLE deficits/detail RLE Deficits / Details: grossly 5/5 RLE Sensation: WNL RLE Coordination: WNL LLE Deficits / Details: grossly 3/5, unable to lift through full ROM due to weakness LLE Sensation: WNL LLE Coordination: decreased gross motor    Cervical / Trunk Assessment Cervical / Trunk Assessment: Normal  Communication   Communication: No difficulties  Cognition Arousal/Alertness: Awake/alert Behavior During Therapy: WFL for tasks assessed/performed Overall Cognitive Status: Within Functional Limits for tasks assessed                                        General Comments      Exercises     Assessment/Plan    PT Assessment Patient needs continued PT services  PT Problem List Decreased strength;Decreased activity tolerance;Decreased balance;Decreased mobility       PT Treatment Interventions DME instruction;Gait training;Stair training;Functional mobility training;Therapeutic activities;Therapeutic exercise;Patient/family education;Balance training    PT Goals (Current goals can be found in the Care Plan section)  Acute Rehab PT Goals Patient Stated Goal: return home with home aides and  family to assist PT Goal Formulation: With patient Time For Goal Achievement: 10/15/20 Potential to Achieve Goals: Good    Frequency Min 3X/week   Barriers to discharge        Co-evaluation               AM-PAC PT "6 Clicks" Mobility  Outcome Measure Help needed turning from your back to your side while in a flat bed without using bedrails?: None Help needed moving from lying on your back to sitting on the side of a flat bed without using bedrails?: None Help needed moving to and from a bed to a chair (including a wheelchair)?: A Little Help needed standing up from a chair using your arms (e.g., wheelchair or bedside chair)?: A Little Help needed to walk in hospital room?: A Lot Help needed climbing 3-5 steps with a railing? : A Lot 6 Click Score: 18    End of Session Equipment Utilized During Treatment: Oxygen Activity Tolerance: Patient tolerated treatment well;Patient limited by fatigue Patient left: in chair;with call bell/phone within reach;with chair alarm set Nurse Communication: Mobility status PT Visit Diagnosis: Unsteadiness on feet (R26.81);Other abnormalities of gait and mobility (R26.89);Repeated falls (R29.6);Muscle weakness (generalized) (M62.81)    Time: [id number] PT Time Calculation (min) (ACUTE ONLY): 28 min   Charges:   PT Evaluation $PT Eval Moderate Complexity: 1 Mod PT Treatments $Therapeutic Activity: 23-37 mins        4:05 PM, 10/12/20 Lonell Grandchild, MPT Physical Therapist with [facility] [phone] office [phone]  mobile phone

## 2020-10-12 NOTE — Evaluation (Signed)
Speech Language Pathology Evaluation Patient Details Name: Rick Cruz MRN: [id number] DOB: [birth year] Today's Date: 10/12/2020 Time: [id number] SLP Time Calculation (min) (ACUTE ONLY): 31 min  Problem List:  Patient Active Problem List   Diagnosis Date Noted  . Ischemic cerebrovascular accident (CVA) ([place]) 10/11/2020  . Hypoalbuminemia 10/11/2020  . Class 1 obesity 10/11/2020  . Iron deficiency anemia 10/11/2020  . Acute metabolic encephalopathy [id number]  . DKA, type 2, not at goal [facility]) 11/03/2019  . Heart failure ([place]) 02/02/2019  . Hyperglycemia   . Altered mental status 11/21/2018  . Diabetic foot ([place]) 07/23/2018  . Accidental fall 06/25/2018  . Supplemental oxygen dependent 06/25/2018  . 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 Neck pain 01/09/2018  . 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 Diabetic neuropathy ([place]) 12/13/2017  . Gastroesophageal reflux disease 12/13/2017  . Insomnia 07/30/2017  . Depression 07/19/2016  . OSA (obstructive sleep apnea)   . Chronic respiratory failure with hypercapnia ([place])   . Undifferentiated schizophrenia ([place])   . Uncontrolled type 2 diabetes mellitus with complication ([place])   . Anemia of chronic disease   . Paroxysmal atrial fibrillation (HCC)   . Hyponatremia 06/12/2016  . Aspiration pneumonia ([place]) 08/16/2015  . Seborrheic keratosis 08/04/2015  . Pure hypercholesterolemia 07/05/2015  . Obesity hypoventilation syndrome ([place]) 04/05/2015  . CSA (central sleep apnea) 04/05/2015  . Non compliance with medical treatment 04/05/2015  . Type 2 diabetes mellitus with hyperglycemia ([place]) 02/28/2015  . Acute respiratory failure with hypoxia and hypercarbia (HCC)   . Chronic diastolic CHF (congestive heart failure) ([place]) 08/06/2014  . Recurrent falls 05/08/2014  . Ankle fracture, right 05/08/2014  . Ankle fracture, left 05/08/2014  . C. difficile colitis 01/04/2014  . Delirium 01/02/2014  . Acute encephalopathy 01/02/2014  . Morbid  obesity ([place]) 06/17/2013  . Erosive esophagitis 04/22/2013  . Chronic respiratory failure 02 dep with hypercarbia 04/22/2013  . Type 2 diabetes mellitus without complication, with long-term current use of insulin ([place]) 04/21/2013  . Paranoid schizophrenia ([place]) 04/18/2013  . CAD (coronary artery disease) 04/18/2013  . DYSLIPIDEMIA 08/25/2008  . Obstructive sleep apnea 08/25/2008  . Essential hypertension 08/25/2008  . Dyslipidemia 08/25/2008   Past Medical History:  Past Medical History:  Diagnosis Date  . Arthritis   . 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 CHF (congestive heart failure) (HCC)    diastolic   . Chronic pain   . COPD (chronic obstructive pulmonary disease) ([place])   . Depression   . Diabetes mellitus without complication (HCC)    insulin pump  . Hypercholesterolemia   . Hypertension   . On home O2    3 liters  . Oxygen deficiency   . Schizophrenia ([place])   . Sleep apnea    noncompliant with BiPAP  . Sleep apnea   . Tobacco abuse    Past Surgical History:  Past Surgical History:  Procedure Laterality Date  . COLON SURGERY  March 2010   secondary to large colon polyp, final path per discharge summary notes tubulovillous adenoma.   . COLONOSCOPY  July 2011   Dr. Benson [place]: multiple polyps, internal and external hemorrhoids, diverticulosis. tubular adenoma  . ESOPHAGOGASTRODUODENOSCOPY (EGD) WITH PROPOFOL N/A 04/20/2013   Procedure: ESOPHAGOGASTRODUODENOSCOPY (EGD) WITH PROPOFOL;  Surgeon: Daneil Dolin, MD;  Location: [facility];  Service: Endoscopy;  Laterality: N/A;  . KNEE SURGERY     X2   HPI:  Rick Cruz is a 75 y.o. male with medical history  significant of osteoarthritis, history of other nonhemorrhagic stroke, right eye blindness, chronic diastolic heart failure, chronic respiratory failure due to COPD on home oxygen, sleep apnea noncompliant with BiPAP, history of tobacco abuse, type II DM, hyperlipidemia, hypertension, history of unspecified  schizophrenia who is coming to the emergency department due to right lower extremity weakness for the past 2 weeks.  He normally uses a walker, but has had multiple falls in the past 2 weeks.  He states that his legs feel like like is giving out.  He denies head trauma or LOC.  He denies numbness or tingling on his left side, blurred vision or slurred speech or difficulty understanding.  Fever, chills, night sweats, rhinorrhea, sore throat, chest pain, palpitations, diaphoresis, PND, orthopnea or recent pitting edema of the lower extremities.  Denies abdominal pain, nausea, vomiting, diarrhea, constipation, melena or hematochezia.  No dysuria, frequency or hematuria.  No polyuria, polydipsia or polyphagia. MRI shows: There was a 7 mm acute infarct within the right pons. There was a chronic right thalamic lacunar infarct. Chronic small vessel ischemic disease. SLE requested.   Assessment / Plan / Recommendation Clinical Impression  Cognitive linguistic skills appear to be [facility] for items assessed. No family present to confirm baseline, however Pt appears to be a good historian. He states that his wife lives in [place], [place] and he is here in [place] at his mother's house. He has a caregiver come by to assist at home. Pt presents with reduced vocal intensity and sparse dentition which negatively impacts speech intelligilbity, however judged to be greater than 95% intelligible. He was able to express complex wants and needs, follow 2-step commands, and demonstrate recall of recent events independently. No further SLP services indicated at this time. Above discussed with Pt and RN. SLP will sign off.    SLP Assessment  SLP Recommendation/Assessment: Patient does not need any further Speech Lanaguage Pathology Services SLP Visit Diagnosis: Cognitive communication deficit (R41.841)    Follow Up Recommendations  None    Frequency and Duration           SLP Evaluation Cognition  Overall Cognitive  Status: No family/caregiver present to determine baseline cognitive functioning Arousal/Alertness: Awake/alert Orientation Level: Oriented to person;Oriented to place;Oriented to situation (off date and day by one) Memory: Appears intact (3/4 words recalled indpendently, and the 4th with category cue) Awareness: Appears intact Problem Solving: Appears intact Safety/Judgment: Appears intact       Comprehension  Auditory Comprehension Overall Auditory Comprehension: Appears within functional limits for tasks assessed Yes/No Questions: Within Functional Limits Commands: Within Functional Limits Conversation: Complex Visual Recognition/Discrimination Discrimination: Not tested Reading Comprehension Reading Status: Not tested    Expression Expression Primary Mode of Expression: Verbal Verbal Expression Overall Verbal Expression: Appears within functional limits for tasks assessed Initiation: No impairment Automatic Speech: Name;Social Response;Day of week Level of Generative/Spontaneous Verbalization: Conversation Repetition: No impairment Naming: No impairment Pragmatics: No impairment Non-Verbal Means of Communication: Not applicable Written Expression Dominant Hand: Right Written Expression: Not tested   Oral / Motor  Oral Motor/Sensory Function Overall Oral Motor/Sensory Function: Within functional limits Motor Speech Overall Motor Speech: Impaired Respiration: Impaired Level of Impairment: Sentence Phonation: Low vocal intensity;Normal Resonance: Within functional limits Articulation: Within functional limitis Intelligibility: Intelligible Motor Planning: Witnin functional limits Motor Speech Errors: Not applicable Interfering Components: Inadequate dentition Effective Techniques: Increased vocal intensity   Thank you,  Genene Churn, [place]  Raunel Dimartino 10/12/2020, 12:11 PM

## 2020-10-12 NOTE — Progress Notes (Signed)
  Echocardiogram 2D Echocardiogram has been performed.  Rick Cruz 10/12/2020, 11:59 AM

## 2020-10-12 NOTE — Progress Notes (Signed)
Progress Note    Rick Cruz  [id number] DOB: 1946/04/22  DOA: 10/11/2020 PCP: Vassie Moment, NP (Inactive)    Brief Narrative:    Medical records reviewed and are as summarized below:  Rick Cruz is an 75 y.o. male with medical history significant of osteoarthritis, history of other nonhemorrhagic stroke, right eye blindness, chronic diastolic heart failure, chronic respiratory failure due to COPD on home oxygen, sleep apnea noncompliant with BiPAP, history of tobacco abuse, type II DM, hyperlipidemia, hypertension, history of unspecified schizophrenia who is coming to the emergency department due to right lower extremity weakness for the past 2 weeks.   Assessment/Plan:   Principal Problem:   Ischemic cerebrovascular accident (CVA) ([place]) Active Problems:   Obstructive sleep apnea   Essential hypertension   Paranoid schizophrenia ([place])   Type 2 diabetes mellitus without complication, with long-term current use of insulin (HCC)   Chronic respiratory failure 02 dep with hypercarbia   Morbid obesity (HCC)   Chronic diastolic CHF (congestive heart failure) (HCC)   Paroxysmal atrial fibrillation (HCC)   Depression   Diabetic neuropathy (HCC)   Dyslipidemia   Gastroesophageal reflux disease   Class 1 obesity   Ischemic cerebrovascular accident (CVA) : MRI: acute right pontine infarct and 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  - neurochecks. -passed Swallow screen in ER. -PT/OT/SLP. -LDL: 88 (on statin) -hemoglobin A1c. - carotid Doppler. --echocardiogram. -Apixaban held per neuro request. -Continue aspirin 81 mg p.o. daily. -Consult neurology.    Essential hypertension Allow permissive hypertension.    Type 2 diabetes mellitus without complication, with long-term current use of insulin (HCC) Carbohydrate modified diet. Continue Lantus 20 units at bedtime. -SSI -hemoglobin A1c pending    Chronic respiratory failure  with  hypercarbia Continue supplemental oxygen. Continue Trelegy Ellipta daily. Continuous Spiriva daily. Rescue MDI or nebs as needed.    Chronic diastolic CHF (congestive heart failure) (HCC) No signs of decompensation. Holding beta-blocker to allow for permissive HTN    Paroxysmal atrial fibrillation (HCC) [place] VASc Score of at least 5. Off apixaban due to CVA (per ER: Neurology was contacted who recommended to stop apixaban and begin aspirin) Off metoprolol to allow permissive hypertension. Continue cardiac telemetry.    Paranoid schizophrenia (HCC) Continue Seroquel 25 mg in the morning. Continue Seroquel 50 mg at bedtime. On duloxetine for depression. Continue hydroxyzine 25 mg every 12 hours for anxiety.    Depression Continue duloxetine 30 mg p.o. daily.    Diabetic neuropathy (HCC) Continue gabapentin 600 mg p.o. bedtime.    Dyslipidemia On simvastatin- not at goal -LDL: 88 -goal <70    Obstructive sleep apnea -does not wear CPAP  obesity Body mass index is 32.14 kg/m.   Family Communication/Anticipated D/C date and plan/Code Status   DVT prophylaxis: Lovenox ordered. Code Status: Full Code.  Disposition Plan: Status is: Observation  The patient will require care spanning > 2 midnights and should be moved to inpatient because: Inpatient level of care appropriate due to severity of illness  Dispo: The patient is from: Home              Anticipated d/c is to: tbd              Anticipated d/c date is: 2 days              Patient currently is not medically stable to d/c.   Difficult to place patient No         Medical  Consultants:    Neurology  Subjective:   Says he had a stroke in the past that affected the vision in his eye  Objective:    Vitals:   10/12/20 0600 10/12/20 0630 10/12/20 0827 10/12/20 0850  BP: (!) 146/92 (!) 159/98 (!) 169/81   Pulse: 94 100 93   Resp: '15 14 14   '$ Temp:   98.6 F (37 C)   TempSrc:      SpO2: 93%  98% 99% 98%  Weight:      Height:       No intake or output data in the 24 hours ending 10/12/20 0929 Filed Weights   10/11/20 1155  Weight: 107.5 kg    Exam:  General: Appearance:    Obese male in no acute distress     Lungs:      respirations unlabored  Heart:    Normal heart rate.  No murmurs, rubs, or gallops.   MS:   All extremities are intact.   Neurologic:   Awake, alert, oriented x 3. Pleasant and cooperative    Data Reviewed:   I have personally reviewed following labs and imaging studies:  Labs: Labs show the following:   Basic Metabolic Panel: Recent Labs  Lab 10/11/20 1545 10/12/20 0458  NA 137 136  K 4.8 4.2  CL 95* 94*  CO2 33* 32  GLUCOSE 120* 108*  BUN 14 14  CREATININE 1.11 0.98  CALCIUM 9.1 9.4   GFR Estimated Creatinine Clearance: 85.1 mL/min (by C-G formula based on SCr of 0.98 mg/dL). Liver Function Tests: Recent Labs  Lab 10/11/20 1545  AST 22  ALT 19  ALKPHOS 82  BILITOT 0.4  PROT 6.7  ALBUMIN 3.4*   No results for input(s): LIPASE, AMYLASE in the last 168 hours. No results for input(s): AMMONIA in the last 168 hours. Coagulation profile Recent Labs  Lab 10/11/20 2028  INR 1.3*    CBC: Recent Labs  Lab 10/11/20 1545 10/12/20 0458  WBC 11.5* 10.4  NEUTROABS 8.2*  --   HGB 11.1* 12.1*  HCT 38.8* 41.8  MCV 84.0 85.0  PLT 249 249   Cardiac Enzymes: No results for input(s): CKTOTAL, CKMB, CKMBINDEX, TROPONINI in the last 168 hours. BNP (last 3 results) No results for input(s): PROBNP in the last 8760 hours. CBG: Recent Labs  Lab 10/12/20 0838  GLUCAP 143*   D-Dimer: No results for input(s): DDIMER in the last 72 hours. Hgb A1c: No results for input(s): HGBA1C in the last 72 hours. Lipid Profile: Recent Labs    10/12/20 0458  CHOL 165  HDL 39*  LDLCALC 88  TRIG 190*  CHOLHDL 4.2   Thyroid function studies: No results for input(s): TSH, T4TOTAL, T3FREE, THYROIDAB in the last 72 hours.  Invalid  input(s): FREET3 Anemia work up: No results for input(s): VITAMINB12, FOLATE, FERRITIN, TIBC, IRON, RETICCTPCT in the last 72 hours. Sepsis Labs: Recent Labs  Lab 10/11/20 1545 10/12/20 0458  WBC 11.5* 10.4    Microbiology Recent Results (from the past 240 hour(s))  SARS CORONAVIRUS 2 (TAT 6-24 HRS) Nasopharyngeal Nasopharyngeal Swab     Status: None   Collection Time: 10/11/20  6:05 PM   Specimen: Nasopharyngeal Swab  Result Value Ref Range Status   SARS Coronavirus 2 NEGATIVE NEGATIVE Final    Comment: (NOTE) SARS-CoV-2 target nucleic acids are NOT DETECTED.  The SARS-CoV-2 RNA is generally detectable in upper and lower respiratory specimens during the acute phase of infection. Negative results do not  preclude SARS-CoV-2 infection, do not rule out co-infections with other pathogens, and should not be used as the sole basis for treatment or other patient management decisions. Negative results must be combined with clinical observations, patient history, and epidemiological information. The expected result is Negative.  Fact Sheet for Patients: SugarRoll.be  Fact Sheet for Healthcare Providers: https://www.woods-mathews.com/  This test is not yet approved or cleared by the [place] FDA and  has been authorized for detection and/or diagnosis of SARS-CoV-2 by FDA under an Emergency Use Authorization (EUA). This EUA will remain  in effect (meaning this test can be used) for the duration of the COVID-19 declaration under Se ction 564(b)(1) of the Act, 21 U.S.C. section 360bbb-3(b)(1), unless the authorization is terminated or revoked sooner.  Performed at [facility], [place] [address], [place], [place] [address]     Procedures and diagnostic studies:  DG Pelvis 1-2 Views  Result Date: 10/11/2020 CLINICAL DATA:  Left hip soreness.  History of falls. EXAM: PELVIS - 1-2 VIEW COMPARISON:  Abdomen 05/23/2016.  Pelvis  12/05/2014. FINDINGS: Stable deformity right ilium. Degenerative changes lumbar spine and both hips. No acute bony or joint abnormality identified. Aortoiliac atherosclerotic vascular calcification. Pelvic calcifications consistent phleboliths. Ill-defined density noted over the lower pelvis/perineum, most likely external to the patient, possibly a bandage. IMPRESSION: 1. Degenerative changes lumbar spine and both hips. No acute bony abnormality identified. 2. Aortoiliac atherosclerotic vascular disease. Electronically Signed   By: Marcello Moores  Register   On: 10/11/2020 13:39   CT Head Wo Contrast  Result Date: 10/11/2020 CLINICAL DATA:  Left leg weakness EXAM: CT HEAD WITHOUT CONTRAST TECHNIQUE: Contiguous axial images were obtained from the base of the skull through the vertex without intravenous contrast. COMPARISON:  CT brain 02/18/2020, brain MRI 04/27/2019 FINDINGS: Brain: No acute territorial infarction, hemorrhage, or intracranial mass. Moderate atrophy. Moderate hypodensity in the white matter consistent with chronic small vessel ischemic change. Possible age indeterminate lacunar infarct in the right thalamus. Stable ventricle size. Vascular: No hyperdense vessels.  Carotid vascular calcification Skull: Normal. Negative for fracture or focal lesion. Sinuses/Orbits: No acute finding. Other: None IMPRESSION: 1. Possible age indeterminate lacunar infarct in the right thalamus. Otherwise no CT evidence for acute intracranial abnormality. 2. Atrophy and chronic small vessel ischemic changes of the white matter. Electronically Signed   By: Donavan Foil M.D.   On: 10/11/2020 15:36   MR ANGIO HEAD WO CONTRAST  Addendum Date: 10/11/2020   ADDENDUM REPORT: 10/11/2020 17:38 ADDENDUM: Findings of acute right pontine infarct and 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 called by telephone at the time of interpretation on 10/11/2020 at 5:38 pm to provider PA Alroy Bailiff, who verbally acknowledged these results.  Electronically Signed   By: Kellie Simmering DO   On: 10/11/2020 17:38   Result Date: 10/11/2020 CLINICAL DATA:  Stroke, follow-up. Additional history provided: Fatigue, bilateral weakness for 2 days, no known injury. EXAM: MRI HEAD WITHOUT CONTRAST MRA HEAD WITHOUT CONTRAST TECHNIQUE: Multiplanar, multiecho pulse sequences of the brain and surrounding structures were obtained without intravenous contrast. Angiographic images of the head were obtained using MRA technique without contrast. COMPARISON:  Head CT 10/11/2020. brain MRI 04/27/2019. FINDINGS: MRI HEAD FINDINGS Brain: Multiple sequences are significantly motion degraded, limiting evaluation. Most notably, there is moderate motion degradation of the sagittal T1 weighted sequence, mild/moderate motion degradation of the axial T2 weighted sequence, severe motion degradation of the axial T2* sequence, severe motion degradation of the axial T1 weighted sequence, moderate motion  degradation of the coronal T2 weighted sequence and moderate motion degradation of the axial T2/FLAIR sequence. Moderate cerebral atrophy.  Comparatively mild cerebellar atrophy. 7 mm acute infarct within the right pons (series 5, image 11) (series 7, image 17). Chronic right thalamic lacunar infarct. Moderately severe multifocal T2/FLAIR hyperintensity within the cerebral white matter is nonspecific, but compatible with chronic small vessel ischemic disease. No evidence of intracranial mass. No chronic intracranial blood products. No extra-axial fluid collection. No midline shift. Vascular: Reported below. Skull and upper cervical spine: No focal marrow lesion. Sinuses/Orbits: Visualized orbits show no acute finding. No significant paranasal sinus disease. Other: Trace fluid within the left mastoid air cells. MRA HEAD FINDINGS Mild to moderately motion degraded exam. The intracranial internal carotid arteries are patent. Apparent high-grade stenosis within the proximal cavernous left ICA,  which could be exaggerated by motion artifact on the current exam. The M1 middle cerebral arteries are patent. Apparent moderate stenosis within the distal M1 left middle cerebral artery. Additionally, there are apparent high-grade stenoses within bilateral proximal to mid M2 MCA branches. The A1 left ACA is developmentally hypoplastic or absent. The anterior cerebral arteries are otherwise patent. Apparent moderate stenosis within the proximal A2 left ACA. 1-2 mm inferiorly projecting vascular protrusion arising from the paraclinoid right ICA which may reflect an aneurysm or infundibulum (series 1045, image 15). There is a marked paucity of flow related signal arising from the intracranial right vertebral artery and this vessel is likely occluded. The left vertebral artery is patent without significant stenosis. The basilar artery is patent with mild atherosclerotic irregularity. The posterior cerebral arteries are patent. Apparent multifocal severe stenoses within the P2 right posterior cerebral artery. Apparent multifocal. Apparent moderate focal stenosis within the proximal P2 left PCA. 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 A left posterior communicating artery is present. The right posterior communicating artery is hypoplastic or absent. IMPRESSION: MRI brain: 1. Significantly motion degraded exam. 2. 7 mm acute infarct within the right pons. 3. Chronic right thalamic lacunar infarct. 4. Background moderate cerebral atrophy and moderately severe cerebral white matter chronic small vessel ischemic disease. MRA head: 1. Mild to moderately motion degraded exam. 2. Paucity of flow-related signal arising from the intracranial right vertebral artery. This vessel is likely occluded. 3. The basilar artery is patent with mild atherosclerotic irregularity. 4. Additional apparent intracranial stenoses as follows (please note these apparent stenoses may be exaggerated by motion artifact on the  current exam). 5. Apparent high-grade stenosis within the proximal cavernous left ICA. 6. Apparent moderate stenosis within the distal M1 left MCA. 7. Apparent high-grade stenoses within bilateral proximal to mid M2 MCA branches. 8. Apparent moderate stenosis with proximal A2 left ACA. 9. Apparent multifocal severe stenoses within the P2 right PCA. 10. Apparent moderate focal stenosis within the proximal P2 left PCA. 11. 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 Electronically Signed   By: Kellie Simmering DO   On: 10/11/2020 17:38   Result Date: 10/11/2020 CLINICAL DATA:  Stroke, follow-up. Additional history provided: Fatigue, bilateral weakness for 2 days, no known injury. EXAM:  MRI HEAD WITHOUT CONTRAST MRA HEAD WITHOUT CONTRAST TECHNIQUE: Multiplanar, multiecho pulse sequences of the brain and surrounding structures were obtained without intravenous contrast. Angiographic images of the head were obtained using MRA technique without contrast. COMPARISON:  Head CT 10/11/2020. brain MRI 04/27/2019. FINDINGS: MRI HEAD FINDINGS Brain: Multiple sequences are significantly motion degraded, limiting evaluation. Most notably, there is moderate motion degradation of the sagittal T1 weighted sequence, mild/moderate motion degradation of the axial T2 weighted sequence, severe motion degradation of the axial T2* sequence, severe motion degradation of the axial T1 weighted sequence, moderate motion degradation of the coronal T2 weighted sequence and moderate motion degradation of the axial T2/FLAIR sequence.  Moderate cerebral atrophy.  Comparatively mild cerebellar atrophy. 7 mm acute infarct within the right pons (series 5, image 11) (series 7, image 17). Chronic right thalamic lacunar infarct. Moderately severe multifocal T2/FLAIR hyperintensity within the cerebral white matter is nonspecific, but compatible with chronic small vessel ischemic disease. No evidence of intracranial mass. No chronic intracranial blood products. No extra-axial fluid collection. No midline shift. Vascular: Reported below. Skull and upper cervical spine: No focal marrow lesion. Sinuses/Orbits: Visualized orbits show no acute finding. No significant paranasal sinus disease. Other: Trace fluid within the left mastoid air cells. MRA HEAD FINDINGS Mild to moderately motion degraded exam. The intracranial internal carotid arteries are patent. Apparent high-grade stenosis within the proximal cavernous left ICA, which could be exaggerated by motion artifact on the current exam. The M1 middle cerebral arteries are patent. Apparent moderate stenosis within the distal M1 left middle cerebral artery. Additionally, there are apparent high-grade stenoses within bilateral proximal to mid M2 MCA branches. The A1 left ACA is developmentally hypoplastic or absent. The anterior cerebral arteries are otherwise patent. Apparent moderate stenosis within the proximal A2 left ACA. 1-2 mm inferiorly projecting vascular protrusion arising from the paraclinoid right ICA which may reflect an aneurysm or infundibulum (series 1045, image 15). There is a marked paucity of flow related signal arising from the intracranial right vertebral artery and this vessel is likely occluded. The left vertebral artery is patent without significant stenosis. The basilar artery is patent with mild atherosclerotic irregularity. The posterior cerebral arteries are patent. Apparent multifocal severe stenoses within the P2 right posterior cerebral artery. Apparent multifocal. Apparent  moderate focal stenosis within the proximal P2 left PCA. 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 A left posterior communicating artery is present. The right posterior communicating artery is hypoplastic or absent. IMPRESSION: MRI brain: 1. Significantly motion degraded exam. 2. 7 mm acute infarct within the right pons. 3. Chronic right thalamic lacunar infarct. 4. Background moderate cerebral atrophy and moderately severe cerebral white matter chronic small vessel ischemic disease. MRA head: 1. Mild to moderately motion degraded exam. 2. Paucity of flow-related signal arising from the intracranial right vertebral artery. This vessel is likely occluded. 3. The basilar artery is patent with mild atherosclerotic irregularity. 4. Additional apparent intracranial stenoses as follows (please note these apparent stenoses may be exaggerated by motion artifact on the current exam). 5. Apparent high-grade stenosis within the proximal cavernous left ICA. 6. Apparent moderate stenosis within the distal M1 left MCA. 7. Apparent high-grade stenoses within bilateral proximal to mid M2 MCA branches. 8. Apparent moderate stenosis with proximal A2 left ACA. 9. Apparent multifocal severe stenoses within the P2 right PCA. 10. Apparent moderate focal stenosis within the proximal P2 left PCA. 11. 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 Electronically Signed:  By: Kellie Simmering DO On: 10/11/2020 17:35   DG Knee Complete 4 Views Left  Result Date: 10/11/2020 CLINICAL DATA:  Pain.  Recent falls EXAM: LEFT KNEE - COMPLETE 4+ VIEW COMPARISON:  None FINDINGS: Frontal, lateral, and bilateral oblique views were obtained. No fracture or dislocation. No joint effusion. There is mild narrowing of the patellofemoral joint. Other joint spaces appear unremarkable. There is chondrocalcinosis. No erosion. IMPRESSION: Chondrocalcinosis, a finding that may be seen with osteoarthritis or calcium  pyrophosphate deposition disease. Mild narrowing patellofemoral joint. Other joint spaces appear unremarkable. No evident fracture, dislocation, or joint effusion. Electronically Signed   By: Lowella Grip III M.D.   On: 10/11/2020 13:40   DG Femur Min 2 Views Left  Result Date: 10/11/2020 CLINICAL DATA:  Pain following recent fall EXAM: LEFT FEMUR 2 VIEWS COMPARISON:  None. FINDINGS: Frontal and lateral views were obtained. There is no fracture or dislocation. No abnormal periosteal reaction. There is chondrocalcinosis in the knee region. There is slight narrowing of the left hip joint. There are scattered foci of arterial vascular calcification. IMPRESSION: No fracture or dislocation. Mild narrowing left hip joint. Chondrocalcinosis noted in the left knee joint. Scattered foci of arterial vascular calcification. Electronically Signed   By: Lowella Grip III M.D.   On: 10/11/2020 13:39    Medications:   .  stroke: mapping our early stages of recovery book   Does not apply Once  . aspirin  81 mg Oral Daily  . cholecalciferol  1,000 Units Oral BID  . docusate sodium  100 mg Oral BID  . DULoxetine  30 mg Oral q morning - 10a  . fluticasone furoate-vilanterol  1 puff Inhalation Daily  . gabapentin  600 mg Oral QHS  . insulin aspart  0-15 Units Subcutaneous TID WC  . insulin glargine  20 Units Subcutaneous QHS  . melatonin  6 mg Oral QHS  . pantoprazole  40 mg Oral BID AC  . QUEtiapine  25 mg Oral q AM  . QUEtiapine  50 mg Oral QHS  . simvastatin  40 mg Oral QPM  . umeclidinium bromide  1 puff Inhalation Daily   Continuous Infusions:   LOS: 0 days   Geradine Girt  [facility]   How to contact the [facility] Attending or Consulting provider [place] or covering provider during after hours [place], for this patient?  1. Check the care team in [facility] and look for a) attending/consulting [facility] provider listed and b) the [facility] team listed 2. Log into [email or web address] and use [place]'s  universal password to access. If you do not have the password, please contact the hospital operator. 3. Locate the [facility] provider you are looking for under [facility] and page to a number that you can be directly reached. 4. If you still have difficulty reaching the provider, please page the [facility] (Director on Call) for the Hospitalists listed on amion for assistance.  10/12/2020, 9:29 AM

## 2020-10-12 NOTE — Progress Notes (Signed)
Stroke book given to patient, pt is alert and understands he has had a stroke, we read through the booklet and he expresses understanding and has no questions. His wife called and I updated her on what was going on per patients request.

## 2020-10-12 NOTE — Plan of Care (Signed)
  Problem: Acute Rehab PT Goals(only PT should resolve) Goal: Pt Will Go Supine/Side To Sit Outcome: Progressing Flowsheets (Taken 10/12/2020 1609) Pt will go Supine/Side to Sit: with modified independence Goal: Patient Will Transfer Sit To/From Stand Outcome: Progressing Flowsheets (Taken 10/12/2020 1609) Patient will transfer sit to/from stand:  with supervision  with min guard assist Goal: Pt Will Transfer Bed To Chair/Chair To Bed Outcome: Progressing Flowsheets (Taken 10/12/2020 1609) Pt will Transfer Bed to Chair/Chair to Bed:  with supervision  min guard assist Goal: Pt Will Ambulate Outcome: Progressing Flowsheets (Taken 10/12/2020 1609) Pt will Ambulate:  25 feet  with minimal assist  with min guard assist  with rolling walker   4:09 PM, 10/12/20 Lonell Grandchild, MPT Physical Therapist with [facility] [phone] office [phone] mobile phone

## 2020-10-12 NOTE — Progress Notes (Signed)
Rick Cruz admitted to room 328 from ED. Admission assessment completed and significant for left leg weakness 3/5 for motor strength, 3L oxygen nasal cannula is chronic, no evidence of complications from CHF at this time. Patients states that his Afib is chronic and that he trys to manage his CHF and Diabetes with diet modifications at home. His mother has been updated of this admission and plan of care. Patient is requested to have home health services at discharge, he currently uses a standard 2-wheel walker at home. He states that this is not his first CVA, he has previous deficits in his right eye with blurry vision and not able to see well out of that eye. No questions or concerns at this time.

## 2020-10-12 NOTE — TOC Initial Note (Addendum)
Transition of Care [facility]) - Initial/Assessment Note   Patient Details  Name: Rick Cruz MRN: [id number] Date of Birth: 03/09/1946  Transition of Care [facility]) CM/SW Contact:    Sherie Don, LCSW Phone Number: 10/12/2020, 1:44 PM  Clinical Narrative: Patient is a 75 year old male who is under observation for ischemic cerebrovascular accident. Patient has a history of obstructive sleep apnea, hypertension, paranoid schizophrenia, type 2 diabetes mellitus, chronic respiratory failure, morbid obesity, chronic diastolic CHF, paroxysmal atrial fibrillation, depression, diabetic neuropathy, dyslipidemia, and GERD. PT evaluation recommended HHPT.  CSW spoke with patient to complete assessment. Per patient, he resides at home with his mother, but will be moving to [place], [place] to live with his wife in several weeks. Current DME includes a walker. Patient reported he is not active with [facility] services, but is agreeable to CSW referring him out to [facility] agencies in-network with his insurance. Patient reported he is "usually" able to afford his medications and takes these as prescribed. At baseline, he is generally independent with ADLs. Patient uses [facility] for transportation to appointments. Patient is currently private paying for PCS 4 hours/day for the past 2 months.  CSW made referral to [facility] with [facility] for HHPT. Referral accepted. TOC to follow.  Addendum: CSW confirmed with patient's mother he receives 4 hrs PCS per day and he is never home alone.  Expected Discharge Plan: [place] Barriers to Discharge: Continued Medical Work up  Patient Goals and CMS Choice Patient states their goals for this hospitalization and ongoing recovery are:: Get [facility] services CMS Medicare.gov Compare Post Acute Care list provided to:: Patient Choice offered to / list presented to : Patient  Expected Discharge Plan and Services Expected Discharge Plan: [place] In-house  Referral: Clinical Social Work Discharge Planning Services: NA Post Acute Care Choice: [place] arrangements for the past 2 months: Single Family Home              DME Arranged: N/A DME Agency: NA HH Arranged: PT [place] Agency: [facility] Date [place]: 10/12/20 Time [place]: [id number] Representative spoke with at [place]: Joelene Millin  Prior Living Arrangements/Services Living arrangements for the past 2 months: [place] with:: Parents Patient language and need for interpreter reviewed:: Yes Do you feel safe going back to the place where you live?: Yes      Need for Family Participation in Patient Care: Yes (Comment) Care giver support system in place?: Yes (comment) Current home services: DME [facility]) Criminal Activity/Legal Involvement Pertinent to Current Situation/Hospitalization: No - Comment as needed  Permission Sought/Granted Permission sought to share information with : Other (comment) Permission granted to share information with : Yes, Verbal Permission Granted Permission granted to share info w AGENCY: [place] agencies  Emotional Assessment Appearance:: Appears stated age Attitude/Demeanor/Rapport: Engaged Affect (typically observed): Accepting Orientation: : Oriented to Self,Oriented to Place,Oriented to  Time,Oriented to Situation Alcohol / Substance Use: Not Applicable Psych Involvement: No (comment)  Admission diagnosis:  Fall [W19.XXXA] Stroke (cerebrum) [facility]) [I63.9] 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 Ischemic cerebrovascular accident (CVA) ([place]) 10/11/2020  . Hypoalbuminemia 10/11/2020  . Class 1 obesity 10/11/2020  . Iron deficiency anemia 10/11/2020  . Acute metabolic encephalopathy [id number]  . DKA, type 2, not at goal [facility]) 11/03/2019  . Heart  failure (  [place]) 02/02/2019  . Hyperglycemia   . Altered mental status 11/21/2018  . Diabetic foot ([place]) 07/23/2018  . Accidental fall 06/25/2018  . Supplemental oxygen dependent 06/25/2018  . 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 Neck pain 01/09/2018  . 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 Diabetic neuropathy ([place]) 12/13/2017  . Gastroesophageal reflux disease 12/13/2017  . Insomnia 07/30/2017  . Depression 07/19/2016  . OSA (obstructive sleep apnea)   . Chronic respiratory failure with hypercapnia ([place])   . Undifferentiated schizophrenia ([place])   . Uncontrolled type 2 diabetes mellitus with complication ([place])   . Anemia of chronic disease   . Paroxysmal atrial fibrillation (HCC)   . Hyponatremia 06/12/2016  . Aspiration pneumonia ([place]) 08/16/2015  . Seborrheic keratosis 08/04/2015  . Pure hypercholesterolemia 07/05/2015  . Obesity hypoventilation syndrome ([place]) 04/05/2015  . CSA (central sleep apnea) 04/05/2015  . Non compliance with medical treatment 04/05/2015  . Type 2 diabetes mellitus with hyperglycemia ([place]) 02/28/2015  . Acute respiratory failure with hypoxia and hypercarbia (HCC)   . Chronic diastolic CHF (congestive heart failure) ([place]) 08/06/2014  . Recurrent falls 05/08/2014  . Ankle fracture, right 05/08/2014  . Ankle fracture, left 05/08/2014  . C. difficile colitis 01/04/2014  . Delirium 01/02/2014  . Acute encephalopathy 01/02/2014  . Morbid obesity ([place]) 06/17/2013  . Erosive esophagitis 04/22/2013  . Chronic respiratory failure 02 dep with hypercarbia 04/22/2013  . Type 2 diabetes mellitus without complication, with long-term current use of insulin ([place]) 04/21/2013  . Paranoid schizophrenia ([place]) 04/18/2013  . CAD (coronary artery disease) 04/18/2013  . DYSLIPIDEMIA 08/25/2008  . Obstructive sleep apnea 08/25/2008  . Essential hypertension 08/25/2008  . Dyslipidemia 08/25/2008   PCP:  Vassie Moment, NP (Inactive) Pharmacy:   [facility], [place] AT [place] [id number] [place] [address] Phone: [phone] Fax: [phone]  Readmission Risk Interventions No flowsheet data found.

## 2020-10-12 NOTE — ED Notes (Signed)
ED TO INPATIENT HANDOFF REPORT  ED Nurse Name and Phone [phone]  S Name/Age/Gender Rick Cruz 75 y.o. male Room/Bed: APA04/APA04  Code Status   Code Status: Full Code  Home/SNF/Other Home Patient oriented to: self, place, time and situation Is this baseline? Yes   Triage Complete: Triage complete  Chief Complaint Ischemic cerebrovascular accident (CVA) [facility]) [I63.9]  Triage Note Pt brought in by EMS due to left hip soreness. EMS reports pt has had several falls due to left leg giving out. Last fall yesterday. Reports pain is mild at this time. EMS reports pt able to stand and assist with getting on stretcher    Allergies Allergies  Allergen Reactions  . Phenergan [Promethazine Hcl] Other (See Comments)    Becomes very confused and aggressive  . Haldol [Haloperidol Lactate] Other (See Comments)    Shaking   . Metformin And Related Diarrhea    Level of Care/Admitting Diagnosis ED Disposition    ED Disposition Condition [facility] Area: [facility] [id number]  Level of Care: Telemetry [5]  Covid Evaluation: Asymptomatic Screening Protocol (No Symptoms)  Diagnosis: [id number]  Admitting Physician: Reubin Milan [id number]  Attending Physician: Reubin Milan [id number]       B Medical/Surgery History Past Medical History:  Diagnosis Date  . Arthritis   . 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 CHF (congestive heart failure) (HCC)    diastolic   . Chronic pain   . COPD (chronic obstructive pulmonary disease) ([place])   . Depression   . Diabetes mellitus without complication (HCC)    insulin pump  . Hypercholesterolemia   . Hypertension   . On home O2    3 liters  . Oxygen deficiency   . Schizophrenia ([place])   . Sleep apnea    noncompliant with BiPAP  . Sleep apnea   . Tobacco abuse    Past Surgical History:  Procedure Laterality Date  . COLON SURGERY  March 2010    secondary to large colon polyp, final path per discharge summary notes tubulovillous adenoma.   . COLONOSCOPY  July 2011   Dr. Benson Norway: multiple polyps, internal and external hemorrhoids, diverticulosis. tubular adenoma  . ESOPHAGOGASTRODUODENOSCOPY (EGD) WITH PROPOFOL N/A 04/20/2013   Procedure: ESOPHAGOGASTRODUODENOSCOPY (EGD) WITH PROPOFOL;  Surgeon: Daneil Dolin, MD;  Location: [facility];  Service: Endoscopy;  Laterality: N/A;  . KNEE SURGERY     X2     A IV Location/Drains/Wounds Patient Lines/Drains/Airways Status    Active Line/Drains/Airways    Name Placement date Placement time Site Days   Peripheral IV 10/11/20 Right Antecubital 10/11/20  2021  Antecubital  1   External Urinary Catheter 10/11/20  2022  -  1   Wound / Incision (Open or Dehisced) 02/21/20 Laceration Face Right;Upper 1 cm laceration above right brow 02/21/20  0615  Face  234          Intake/Output Last 24 hours No intake or output data in the 24 hours ending 10/12/20 [id number]  Labs/Imaging Results for orders placed or performed during the hospital encounter of 10/11/20 (from the past 48 hour(s))  CBC with Differential     Status: Abnormal   Collection Time: 10/11/20  3:45 PM  Result Value Ref Range   WBC 11.5 (H) 4.0 - 10.5 K/uL   RBC 4.62 4.22 - 5.81 MIL/uL   Hemoglobin 11.1 (L) 13.0 - 17.0 g/dL   HCT 38.8 (  L) 39.0 - 52.0 %   MCV 84.0 80.0 - 100.0 fL   MCH 24.0 (L) 26.0 - 34.0 pg   MCHC 28.6 (L) 30.0 - 36.0 g/dL   RDW 15.3 11.5 - 15.5 %   Platelets 249 150 - 400 K/uL   nRBC 0.0 0.0 - 0.2 %   Neutrophils Relative % 70 %   Neutro Abs 8.2 (H) 1.7 - 7.7 K/uL   Lymphocytes Relative 19 %   Lymphs Abs 2.2 0.7 - 4.0 K/uL   Monocytes Relative 7 %   Monocytes Absolute 0.8 0.1 - 1.0 K/uL   Eosinophils Relative 3 %   Eosinophils Absolute 0.3 0.0 - 0.5 K/uL   Basophils Relative 1 %   Basophils Absolute 0.1 0.0 - 0.1 K/uL   Immature Granulocytes 0 %   Abs Immature Granulocytes 0.04 0.00 - 0.07 K/uL    Comment:  Performed at [facility], [address] [place], [place] [address]  Comprehensive metabolic panel     Status: Abnormal   Collection Time: 10/11/20  3:45 PM  Result Value Ref Range   Sodium 137 135 - 145 mmol/L   Potassium 4.8 3.5 - 5.1 mmol/L   Chloride 95 (L) 98 - 111 mmol/L   CO2 33 (H) 22 - 32 mmol/L   Glucose, Bld 120 (H) 70 - 99 mg/dL    Comment: Glucose reference range applies only to samples taken after fasting for at least 8 hours.   BUN 14 8 - 23 mg/dL   Creatinine, Ser 1.11 0.61 - 1.24 mg/dL   Calcium 9.1 8.9 - 10.3 mg/dL   Total Protein 6.7 6.5 - 8.1 g/dL   Albumin 3.4 (L) 3.5 - 5.0 g/dL   AST 22 15 - 41 U/L   ALT 19 0 - 44 U/L   Alkaline Phosphatase 82 38 - 126 U/L   Total Bilirubin 0.4 0.3 - 1.2 mg/dL   GFR, Estimated >60 >60 mL/min    Comment: (NOTE) Calculated using the CKD-EPI Creatinine Equation (2021)    Anion gap 9 5 - 15    Comment: Performed at [facility], [address] [place], [place] [address]  SARS CORONAVIRUS 2 (TAT 6-24 HRS) Nasopharyngeal Nasopharyngeal Swab     Status: None   Collection Time: 10/11/20  6:05 PM   Specimen: Nasopharyngeal Swab  Result Value Ref Range   SARS Coronavirus 2 NEGATIVE NEGATIVE    Comment: (NOTE) SARS-CoV-2 target nucleic acids are NOT DETECTED.  The SARS-CoV-2 RNA is generally detectable in upper and lower respiratory specimens during the acute phase of infection. Negative results do not preclude SARS-CoV-2 infection, do not rule out co-infections with other pathogens, and should not be used as the sole basis for treatment or other patient management decisions. Negative results must be combined with clinical observations, patient history, and epidemiological information. The expected result is Negative.  Fact Sheet for Patients: SugarRoll.be  Fact Sheet for Healthcare Providers: https://www.woods-mathews.com/  This test is not yet approved or cleared by the [place] FDA and  has been authorized for detection and/or diagnosis of SARS-CoV-2 by FDA under an Emergency Use Authorization (EUA). This EUA will remain  in effect (meaning this test can be used) for the duration of the COVID-19 declaration under Se ction 564(b)(1) of the Act, 21 U.S.C. section 360bbb-3(b)(1), unless the authorization is terminated or revoked sooner.  Performed at [facility], [place] [address] [place], [place] [address]   Ethanol     Status: None  Collection Time: 10/11/20  8:28 PM  Result Value Ref Range   Alcohol, Ethyl (B) <10 <10 mg/dL    Comment: (NOTE) Lowest detectable limit for serum alcohol is 10 mg/dL.  For medical purposes only. Performed at [facility], [address], [place], [place] [address]   Protime-INR     Status: Abnormal   Collection Time: 10/11/20  8:28 PM  Result Value Ref Range   Prothrombin Time 15.3 (H) 11.4 - 15.2 seconds   INR 1.3 (H) 0.8 - 1.2    Comment: (NOTE) INR goal varies based on device and disease states. Performed at [facility], [address] [place], [place] [address]   APTT     Status: None   Collection Time: 10/11/20  8:28 PM  Result Value Ref Range   aPTT 32 24 - 36 seconds    Comment: Performed at [facility], [address], [place], [place] [address]   DG Pelvis 1-2 Views  Result Date: 10/11/2020 CLINICAL DATA:  Left hip soreness.  History of falls. EXAM: PELVIS - 1-2 VIEW COMPARISON:  Abdomen 05/23/2016.  Pelvis 12/05/2014. FINDINGS: Stable deformity right ilium. Degenerative changes lumbar spine and both hips. No acute bony or joint abnormality identified. Aortoiliac atherosclerotic vascular calcification. Pelvic calcifications consistent phleboliths. Ill-defined density noted over the lower pelvis/perineum, most likely external to the patient, possibly a bandage. IMPRESSION: 1. Degenerative changes lumbar spine and both hips. No acute bony abnormality identified. 2. Aortoiliac atherosclerotic vascular  disease. Electronically Signed   By: Marcello Moores  Register   On: 10/11/2020 13:39   CT Head Wo Contrast  Result Date: 10/11/2020 CLINICAL DATA:  Left leg weakness EXAM: CT HEAD WITHOUT CONTRAST TECHNIQUE: Contiguous axial images were obtained from the base of the skull through the vertex without intravenous contrast. COMPARISON:  CT brain 02/18/2020, brain MRI 04/27/2019 FINDINGS: Brain: No acute territorial infarction, hemorrhage, or intracranial mass. Moderate atrophy. Moderate hypodensity in the white matter consistent with chronic small vessel ischemic change. Possible age indeterminate lacunar infarct in the right thalamus. Stable ventricle size. Vascular: No hyperdense vessels.  Carotid vascular calcification Skull: Normal. Negative for fracture or focal lesion. Sinuses/Orbits: No acute finding. Other: None IMPRESSION: 1. Possible age indeterminate lacunar infarct in the right thalamus. Otherwise no CT evidence for acute intracranial abnormality. 2. Atrophy and chronic small vessel ischemic changes of the white matter. Electronically Signed   By: Donavan Foil M.D.   On: 10/11/2020 15:36   MR ANGIO HEAD WO CONTRAST  Addendum Date: 10/11/2020   ADDENDUM REPORT: 10/11/2020 17:38 ADDENDUM: Findings of acute right pontine infarct and 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 called by telephone at the time of interpretation on 10/11/2020 at 5:38 pm to provider PA Alroy Bailiff, who verbally acknowledged these results. Electronically Signed   By: Kellie Simmering DO   On: 10/11/2020 17:38   Result Date: 10/11/2020 CLINICAL DATA:  Stroke, follow-up. Additional history provided: Fatigue, bilateral weakness for 2 days, no known injury. EXAM: MRI HEAD WITHOUT CONTRAST MRA HEAD WITHOUT CONTRAST TECHNIQUE: Multiplanar, multiecho pulse sequences of the brain and surrounding structures were obtained without intravenous contrast. Angiographic images of the head were obtained using MRA technique without contrast. COMPARISON:   Head CT 10/11/2020. brain MRI 04/27/2019. FINDINGS: MRI HEAD FINDINGS Brain: Multiple sequences are significantly motion degraded, limiting evaluation. Most notably, there is moderate motion degradation of the sagittal T1 weighted sequence, mild/moderate motion degradation of the axial T2 weighted sequence, severe motion degradation of the axial T2* sequence, severe motion degradation  of the axial T1 weighted sequence, moderate motion degradation of the coronal T2 weighted sequence and moderate motion degradation of the axial T2/FLAIR sequence. Moderate cerebral atrophy.  Comparatively mild cerebellar atrophy. 7 mm acute infarct within the right pons (series 5, image 11) (series 7, image 17). Chronic right thalamic lacunar infarct. Moderately severe multifocal T2/FLAIR hyperintensity within the cerebral white matter is nonspecific, but compatible with chronic small vessel ischemic disease. No evidence of intracranial mass. No chronic intracranial blood products. No extra-axial fluid collection. No midline shift. Vascular: Reported below. Skull and upper cervical spine: No focal marrow lesion. Sinuses/Orbits: Visualized orbits show no acute finding. No significant paranasal sinus disease. Other: Trace fluid within the left mastoid air cells. MRA HEAD FINDINGS Mild to moderately motion degraded exam. The intracranial internal carotid arteries are patent. Apparent high-grade stenosis within the proximal cavernous left ICA, which could be exaggerated by motion artifact on the current exam. The M1 middle cerebral arteries are patent. Apparent moderate stenosis within the distal M1 left middle cerebral artery. Additionally, there are apparent high-grade stenoses within bilateral proximal to mid M2 MCA branches. The A1 left ACA is developmentally hypoplastic or absent. The anterior cerebral arteries are otherwise patent. Apparent moderate stenosis within the proximal A2 left ACA. 1-2 mm inferiorly projecting vascular  protrusion arising from the paraclinoid right ICA which may reflect an aneurysm or infundibulum (series 1045, image 15). There is a marked paucity of flow related signal arising from the intracranial right vertebral artery and this vessel is likely occluded. The left vertebral artery is patent without significant stenosis. The basilar artery is patent with mild atherosclerotic irregularity. The posterior cerebral arteries are patent. Apparent multifocal severe stenoses within the P2 right posterior cerebral artery. Apparent multifocal. Apparent moderate focal stenosis within the proximal P2 left PCA. 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 A left posterior communicating artery is present. The right posterior communicating artery is hypoplastic or absent. IMPRESSION: MRI brain: 1. Significantly motion degraded exam. 2. 7 mm acute infarct within the right pons. 3. Chronic right thalamic lacunar infarct. 4. Background moderate cerebral atrophy and moderately severe cerebral white matter chronic small vessel ischemic disease. MRA head: 1. Mild to moderately motion degraded exam. 2. Paucity of flow-related signal arising from the intracranial right vertebral artery. This vessel is likely occluded. 3. The basilar artery is patent with mild atherosclerotic irregularity. 4. Additional apparent intracranial stenoses as follows (please note these apparent stenoses may be exaggerated by motion artifact on the current exam). 5. Apparent high-grade stenosis within the proximal cavernous left ICA. 6. Apparent moderate stenosis within the distal M1 left MCA. 7. Apparent high-grade stenoses within bilateral proximal to mid M2 MCA branches. 8. Apparent moderate stenosis with proximal A2 left ACA. 9. Apparent multifocal severe stenoses within the P2 right PCA. 10. Apparent moderate focal stenosis within the proximal P2 left PCA. 11. 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 Electronically Signed   By: Kellie Simmering DO   On: 10/11/2020 17:38   Result Date: 10/11/2020 CLINICAL DATA:  Stroke, follow-up. Additional history provided: Fatigue, bilateral  weakness for 2 days, no known injury. EXAM: MRI HEAD WITHOUT CONTRAST MRA HEAD WITHOUT CONTRAST TECHNIQUE: Multiplanar, multiecho pulse sequences of the brain and surrounding structures were obtained without intravenous contrast. Angiographic images of the head were obtained using MRA technique without contrast. COMPARISON:  Head CT 10/11/2020. brain MRI 04/27/2019. FINDINGS: MRI HEAD FINDINGS Brain: Multiple sequences are significantly motion degraded, limiting evaluation. Most notably, there is moderate motion degradation of the sagittal T1 weighted sequence, mild/moderate motion degradation of the axial T2 weighted sequence, severe motion degradation of the axial T2* sequence, severe motion degradation of the axial T1 weighted sequence, moderate motion degradation of the coronal T2 weighted sequence and moderate motion degradation of the axial T2/FLAIR sequence. Moderate cerebral atrophy.  Comparatively mild cerebellar atrophy. 7 mm acute infarct within the right pons (series 5, image 11) (series 7, image 17). Chronic right thalamic lacunar infarct. Moderately severe multifocal T2/FLAIR hyperintensity within the cerebral white matter is nonspecific, but compatible with chronic small vessel ischemic disease. No evidence of intracranial mass. No chronic intracranial blood products. No extra-axial fluid collection. No midline shift. Vascular: Reported  below. Skull and upper cervical spine: No focal marrow lesion. Sinuses/Orbits: Visualized orbits show no acute finding. No significant paranasal sinus disease. Other: Trace fluid within the left mastoid air cells. MRA HEAD FINDINGS Mild to moderately motion degraded exam. The intracranial internal carotid arteries are patent. Apparent high-grade stenosis within the proximal cavernous left ICA, which could be exaggerated by motion artifact on the current exam. The M1 middle cerebral arteries are patent. Apparent moderate stenosis within the distal M1 left middle cerebral artery. Additionally, there are apparent high-grade stenoses within bilateral proximal to mid M2 MCA branches. The A1 left ACA is developmentally hypoplastic or absent. The anterior cerebral arteries are otherwise patent. Apparent moderate stenosis within the proximal A2 left ACA. 1-2 mm inferiorly projecting vascular protrusion arising from the paraclinoid right ICA which may reflect an aneurysm or infundibulum (series 1045, image 15). There is a marked paucity of flow related signal arising from the intracranial right vertebral artery and this vessel is likely occluded. The left vertebral artery is patent without significant stenosis. The basilar artery is patent with mild atherosclerotic irregularity. The posterior cerebral arteries are patent. Apparent multifocal severe stenoses within the P2 right posterior cerebral artery. Apparent multifocal. Apparent moderate focal stenosis within the proximal P2 left PCA. 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 A left posterior communicating artery is present. The right posterior communicating artery is hypoplastic or absent. IMPRESSION: MRI brain: 1. Significantly motion degraded exam. 2. 7 mm acute infarct within the right pons. 3. Chronic right thalamic lacunar infarct. 4. Background moderate cerebral atrophy and moderately severe cerebral white matter chronic small vessel  ischemic disease. MRA head: 1. Mild to moderately motion degraded exam. 2. Paucity of flow-related signal arising from the intracranial right vertebral artery. This vessel is likely occluded. 3. The basilar artery is patent with mild atherosclerotic irregularity. 4. Additional apparent intracranial stenoses as follows (please note these apparent stenoses may be exaggerated by motion artifact on the current exam). 5. Apparent high-grade stenosis within the proximal cavernous left ICA. 6. Apparent moderate stenosis within the distal M1 left MCA. 7. Apparent high-grade stenoses within bilateral proximal to mid M2 MCA branches. 8. Apparent moderate stenosis with proximal A2 left ACA. 9. Apparent multifocal severe stenoses within the P2 right PCA. 10. Apparent moderate focal stenosis within the proximal P2 left PCA. 11. Apparent severe stenosis within the  left PCA at the P2/P3 junction. Electronically Signed: By: Kellie Simmering DO On: 10/11/2020 17:35   DG Knee Complete 4 Views Left  Result Date: 10/11/2020 CLINICAL DATA:  Pain.  Recent falls EXAM: LEFT KNEE - COMPLETE 4+ VIEW COMPARISON:  None FINDINGS: Frontal, lateral, and bilateral oblique views were obtained. No fracture or dislocation. No joint effusion. There is mild narrowing of the patellofemoral joint. Other joint spaces appear unremarkable. There is chondrocalcinosis. No erosion. IMPRESSION: Chondrocalcinosis, a finding that may be seen with osteoarthritis or calcium pyrophosphate deposition disease. Mild narrowing patellofemoral joint. Other joint spaces appear unremarkable. No evident fracture, dislocation, or joint effusion. Electronically Signed   By: Lowella Grip III M.D.   On: 10/11/2020 13:40   DG Femur Min 2 Views Left  Result Date: 10/11/2020 CLINICAL DATA:  Pain following recent fall EXAM: LEFT FEMUR 2 VIEWS COMPARISON:  None. FINDINGS: Frontal and lateral views were obtained. There is no fracture or dislocation. No abnormal periosteal  reaction. There is chondrocalcinosis in the knee region. There is slight narrowing of the left hip joint. There are scattered foci of arterial vascular calcification. IMPRESSION: No fracture or dislocation. Mild narrowing left hip joint. Chondrocalcinosis noted in the left knee joint. Scattered foci of arterial vascular calcification. Electronically Signed   By: Lowella Grip III M.D.   On: 10/11/2020 13:39    Pending Labs Unresulted Labs (From admission, onward)          Start     Ordered   10/12/20 0500  Hemoglobin A1c  (Labs)  Tomorrow morning,   R        10/11/20 1953   10/12/20 0500  Lipid panel  (Labs)  Tomorrow morning,   R       Comments: Fasting    10/11/20 1953   10/12/20 0500  CBC  Tomorrow morning,   R        10/11/20 1953   10/12/20 [id number]  Basic metabolic panel  Tomorrow morning,   R        10/11/20 1953   10/11/20 1756  Urine rapid drug screen (hosp performed)  ONCE - STAT,   STAT        10/11/20 1756   10/11/20 1506  Urinalysis, Routine w reflex microscopic  ONCE - STAT,   STAT        10/11/20 1506          Vitals/Pain Today's Vitals   10/12/20 0230 10/12/20 0300 10/12/20 0330 10/12/20 0530  BP: (!) 159/93 (!) 149/85 (!) 144/92 (!) 155/85  Pulse: (!) 103 88 (!) 51 97  Resp: (!) '22 13 13 13  '$ Temp:      TempSrc:      SpO2: 98% 93% 90% 96%  Weight:      Height:      PainSc:        Isolation Precautions No active isolations  Medications Medications   stroke: mapping our early stages of recovery book (has no administration in time range)  acetaminophen (TYLENOL) tablet 650 mg (has no administration in time range)    Or  acetaminophen (TYLENOL) suppository 650 mg (has no administration in time range)  ondansetron (ZOFRAN) tablet 4 mg (has no administration in time range)    Or  ondansetron (ZOFRAN) injection 4 mg (has no administration in time range)  Dulaglutide SOPN 3 mg (has no administration in time range)  triamcinolone (KENALOG) 0.1 % cream 1  application (has no administration in time range)  traMADol (  ULTRAM) tablet 100 mg (has no administration in time range)  simvastatin (ZOCOR) tablet 40 mg (40 mg Oral Given 10/11/20 2214)  QUEtiapine (SEROQUEL) tablet 50 mg (50 mg Oral Given 10/11/20 2215)  QUEtiapine (SEROQUEL) tablet 25 mg (25 mg Oral Given 10/12/20 0614)  pantoprazole (PROTONIX) EC tablet 40 mg (has no administration in time range)  melatonin tablet 6 mg (6 mg Oral Given 10/11/20 2214)  insulin glargine (LANTUS) injection 20 Units (20 Units Subcutaneous Given 10/11/20 2219)  hydrOXYzine (ATARAX/VISTARIL) tablet 25 mg (has no administration in time range)  gabapentin (NEURONTIN) capsule 600 mg (600 mg Oral Given 10/11/20 2214)  DULoxetine (CYMBALTA) DR capsule 30 mg (has no administration in time range)  docusate sodium (COLACE) capsule 100 mg (100 mg Oral Given 10/11/20 2215)  cholecalciferol (VITAMIN D3) tablet 1,000 Units (1,000 Units Oral Given 10/11/20 2214)  albuterol (PROVENTIL) (2.5 MG/3ML) 0.083% nebulizer solution 2.5 mg (has no administration in time range)  insulin aspart (novoLOG) injection 0-15 Units (has no administration in time range)  umeclidinium bromide (INCRUSE ELLIPTA) 62.5 MCG/INH 1 puff (has no administration in time range)  fluticasone furoate-vilanterol (BREO ELLIPTA) 100-25 MCG/INH 1 puff (has no administration in time range)  polyvinyl alcohol (LIQUIFILM TEARS) 1.4 % ophthalmic solution 1 drop (has no administration in time range)  aspirin chewable tablet 81 mg (81 mg Oral Given 10/11/20 1910)    Mobility walks High fall risk   Focused Assessments Neuro Assessment Handoff:  Swallow screen pass? Yes    NIH Stroke Scale ( + Modified Stroke Scale Criteria)  Interval: Initial Level of Consciousness (1a.)   : Alert, keenly responsive LOC Questions (1b. )   +: Answers both questions correctly LOC Commands (1c. )   + : Performs both tasks correctly Best Gaze (2. )  +: Normal Visual (3. )  +: No  visual loss Facial Palsy (4. )    : Normal symmetrical movements Motor Arm, Left (5a. )   +: No drift Motor Arm, Right (5b. )   +: No drift Motor Leg, Left (6a. )   +: No effort against gravity Motor Leg, Right (6b. )   +: No drift Limb Ataxia (7. ): Absent Sensory (8. )   +: Normal, no sensory loss Best Language (9. )   +: No aphasia Dysarthria (10. ): Normal Extinction/Inattention (11.)   +: No Abnormality Modified SS Total  +: 3 Complete NIHSS TOTAL: 4     Neuro Assessment:   Neuro Checks:   Initial (10/11/20 1801)  Last Documented NIHSS Modified Score: 3 (10/12/20 0600) Has TPA been given? No If patient is a Neuro Trauma and patient is going to OR before floor call report to [place] nurse: [phone] or [phone]     R Recommendations: See Admitting Provider Note  Report given to:   Additional Notes:

## 2020-10-13 DIAGNOSIS — I69952 Hemiplegia and hemiparesis following unspecified cerebrovascular disease affecting left dominant side: Secondary | ICD-10-CM | POA: Diagnosis not present

## 2020-10-13 LAB — BASIC METABOLIC PANEL
Anion gap: 7 (ref 5–15)
BUN: 9 mg/dL (ref 8–23)
CO2: 31 mmol/L (ref 22–32)
Calcium: 9.1 mg/dL (ref 8.9–10.3)
Chloride: 92 mmol/L — ABNORMAL LOW (ref 98–111)
Creatinine, Ser: 0.85 mg/dL (ref 0.61–1.24)
GFR, Estimated: >60 mL/min (ref >60)
Glucose, Bld: 122 mg/dL — ABNORMAL HIGH (ref 70–99)
Potassium: 4.3 mmol/L (ref 3.5–5.1)
Sodium: 130 mmol/L — ABNORMAL LOW (ref 135–145)

## 2020-10-13 LAB — CBC
HCT: 35.9 % — ABNORMAL LOW (ref 39.0–52.0)
Hemoglobin: 10.8 g/dL — ABNORMAL LOW (ref 13.0–17.0)
MCH: 24.3 pg — ABNORMAL LOW (ref 26.0–34.0)
MCHC: 30.1 g/dL (ref 30.0–36.0)
MCV: 80.9 fL (ref 80.0–100.0)
Platelets: 234 10*3/uL (ref 150–400)
RBC: 4.44 MIL/uL (ref 4.22–5.81)
RDW: 14.6 % (ref 11.5–15.5)
WBC: 10.6 10*3/uL — ABNORMAL HIGH (ref 4.0–10.5)
nRBC: 0 % (ref 0.0–0.2)

## 2020-10-13 LAB — TSH: TSH: 1.977 u[IU]/mL (ref 0.350–4.500)

## 2020-10-13 LAB — GLUCOSE, CAPILLARY
Glucose-Capillary: 145 mg/dL — ABNORMAL HIGH (ref 70–99)
Glucose-Capillary: 115 mg/dL — ABNORMAL HIGH (ref 70–99)
Glucose-Capillary: 101 mg/dL — ABNORMAL HIGH (ref 70–99)

## 2020-10-13 MED ORDER — CLOPIDOGREL BISULFATE 75 MG PO TABS
75.0000 mg | ORAL_TABLET | Freq: Every day | ORAL | Status: DC
  Administered 2020-10-13 – 2020-10-14 (×2): 75 mg via ORAL
  Filled 2020-10-13 (×2): qty 1

## 2020-10-13 NOTE — Consult Note (Signed)
Sarita A. Merlene Laughter, MD     [email or web address]          Rick Cruz is an 75 y.o. male.   ASSESSMENT/PLAN: 1.  Acute/subacute left-sided weakness and the pain due to right pontine infarct.  Risk factors hypertension, diabetes and age.  Dual antiplatelet agents are recommended for 1 month.  Afterwards a single agent is recommended.  Plavix can be use long term.  As usual blood pressure and diabetes control is recommended.  I also recommend the use of statin and control of dyslipidemia.  The patient will need occupational and physical therapies. 2.  Severe chronic microvascular ischemic changes on imaging which increases the long-term risk for dementia and gait impairment.  Dementia labs are obtained. 3.  Mild tardive dyskinesia: Observation is recommended.  However, avoid neuroleptics if possible. 4.  Severe vision impairment involving the right eye    The patient is a 75 year old white male who presents with apparently a 2-week history of left-sided weakness.  The patient did not decide to seek medical attention until he developed pain.  The patient does not specifically report dysarthria or dysphagia.  The seem to have limited insight into his medical condition.  He however reports that his speech was fine.  He does not report having chest pain or shortness of breath.  The review of systems otherwise negative.    GENERAL: Patient is doing well at this time  HEENT: There is severe right exotropia with severe vision impairment.  The patient seems to only see shadows.  There is mild to moderate amount of orofacial dyskinesia and infrequent blepharospasms.  ABDOMEN: soft  EXTREMITIES: No edema; there is occasional dyskinesia of the left hand at times.  BACK: Normal  SKIN: Normal by inspection.    MENTAL STATUS: He is awake and cooperates with evaluation.  He seems to have significant cognitive impairment which I assume is baseline.  Insight are limited.  The  patient states his age but is not oriented to time +1.  He follows commands well.  There is mild dysarthria noted +1.  CRANIAL NERVES: Pupils are equal, round and reactive to light; extra ocular movements are full, there is no significant nystagmus; visual fields are full - L; severe visual impairment of the right eye; upper and lower facial muscles are normal in strength and symmetric, there is no flattening of the nasolabial folds; tongue is midline; uvula is midline; shoulder elevation is normal.  MOTOR: There is seem to be some evidence of motor impersistence which limits the evaluation.  However, right upper extremity shows normal tone, bulk and strength.  Right leg 4/5.  There is mild drift +1.  Left upper extremity strength is about 4/5 with mild drift +1.  Left leg graded as 2/5 with a extremity abducted and externally rotated +3.  COORDINATION: Left finger to nose is normal, right finger to nose is normal, No rest tremor; no intention tremor; no postural tremor; no bradykinesia.  REFLEXES: Deep tendon reflexes are symmetrical and normal.   SENSATION: Normal to pain.  NIHSS 1,1,1,1,3 =7.   The brain MRI is reviewed in person and shows severe confluent leukoencephalopathy indicating severe microvascular changes that is chronic.  There is a increase in all seen on DWI involving the right pontine region consistent with acute lacunar infarct.  MRA is degraded by motion artifact although there is some luminal irregularities in multiple vessels.    Blood pressure (!) 167/105, pulse 94, temperature 98.6 F (37 C), resp.  rate 17, height 6' (1.829 m), weight 107.5 kg, SpO2 96 %.  Past Medical History:  Diagnosis Date  . Arthritis   . 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 CHF (congestive heart failure) (HCC)    diastolic   . Chronic pain   . COPD (chronic obstructive pulmonary disease) ([place])   . Depression   . Diabetes mellitus without complication (HCC)    insulin pump  .  Hypercholesterolemia   . Hypertension   . On home O2    3 liters  . Oxygen deficiency   . Schizophrenia ([place])   . Sleep apnea    noncompliant with BiPAP  . Sleep apnea   . Tobacco abuse     Past Surgical History:  Procedure Laterality Date  . COLON SURGERY  March 2010   secondary to large colon polyp, final path per discharge summary notes tubulovillous adenoma.   . COLONOSCOPY  July 2011   Dr. Benson Norway: multiple polyps, internal and external hemorrhoids, diverticulosis. tubular adenoma  . ESOPHAGOGASTRODUODENOSCOPY (EGD) WITH PROPOFOL N/A 04/20/2013   Procedure: ESOPHAGOGASTRODUODENOSCOPY (EGD) WITH PROPOFOL;  Surgeon: Daneil Dolin, MD;  Location: [facility];  Service: Endoscopy;  Laterality: N/A;  . KNEE SURGERY     X2    Family History  Problem Relation Age of Onset  . Thyroid disease Mother   . Parkinson's disease Father   . Parkinson's disease Other   . Colon cancer Neg Hx     Social History:  reports that he quit smoking about 7 years ago. His smoking use included cigarettes. He has a 50.00 pack-year smoking history. He has never used smokeless tobacco. He reports that he does not drink alcohol and does not use drugs.  Allergies:  Allergies  Allergen Reactions  . Phenergan [Promethazine Hcl] Other (See Comments)    Becomes very confused and aggressive  . Haldol [Haloperidol Lactate] Other (See Comments)    Shaking   . Metformin And Related Diarrhea    Medications: Prior to Admission medications   Medication Sig Start Date End Date Taking? Authorizing Provider  albuterol (PROVENTIL) (2.5 MG/3ML) 0.083% nebulizer solution Take 3 mLs (2.5 mg total) by nebulization every 6 (six) hours as needed for wheezing or shortness of breath. 02/22/20  Yes Emokpae, Courage, MD  apixaban (ELIQUIS) 5 MG TABS tablet Take 1 tablet (5 mg total) by mouth 2 (two) times daily. 02/22/20  Yes Emokpae, Courage, MD  carboxymethylcellulose (REFRESH PLUS) 0.5 % SOLN Apply 1 drop to eye 3 (three) times  daily as needed.   Yes [provider]  cholecalciferol (VITAMIN D) 1000 UNITS tablet Take 1,000 Units by mouth 2 (two) times daily.    Yes [provider]  Cinnamon 500 MG capsule Take 1,000 mg by mouth in the morning and at bedtime.    Yes [provider]  docusate sodium (COLACE) 100 MG capsule Take 100 mg by mouth 2 (two) times daily.   Yes [provider]  DULoxetine (CYMBALTA) 30 MG capsule Take 1 capsule (30 mg total) by mouth every morning. 02/22/20  Yes Emokpae, Courage, MD  Fluticasone-Umeclidin-Vilant (TRELEGY ELLIPTA) 100-62.5-25 MCG/INH AEPB Inhale 1 puff into the lungs daily. 02/22/20  Yes Emokpae, Courage, MD  furosemide (LASIX) 20 MG tablet Take 2 tablets (40 mg total) by mouth daily. Patient taking differently: Take 60 mg by mouth daily. 02/22/20  Yes Emokpae, Courage, MD  gabapentin (NEURONTIN) 300 MG capsule Take 1 capsule (300 mg total) by mouth 3 (three)  times daily. Patient taking differently: Take 600 mg by mouth at bedtime. 11/22/18  Yes Vassie Loll, MD  hydrOXYzine (ATARAX/VISTARIL) 25 MG tablet Take 1 tablet (25 mg total) by mouth every 12 (twelve) hours as needed for anxiety or itching (Anxiety and insomnia or restlessness). 02/22/20  Yes Emokpae, Courage, MD  insulin glargine (LANTUS SOLOSTAR) 100 UNIT/ML Solostar Pen Inject 25 Units into the skin 2 (two) times daily. Patient taking differently: Inject 20 Units into the skin at bedtime. 02/22/20  Yes Emokpae, Courage, MD  insulin lispro (HUMALOG) 100 UNIT/ML injection Inject 0-0.1 mLs (0-10 Units total) into the skin See admin instructions. Per sliding scale insulin Lispro (Humalog) injection 0-10 Units 0-10 Units Subcutaneous, 3 times daily with meals CBG 70 - 120: 0 unit CBG 121 - 150: 0 unit  CBG 151 - 200: 1 unit CBG 201 - 250: 2 units CBG 251 - 300: 4 units CBG 301 - 350: 6 units  CBG 351 - 400: 8 units  CBG > 400: 10 units Patient taking differently: Inject 13 Units into the skin in the  morning and at bedtime. 02/22/20  Yes Emokpae, Courage, MD  melatonin 3 MG TABS tablet Take 6 mg by mouth at bedtime.   Yes [provider]  Multiple Vitamins-Minerals (CENTRUM SILVER 50+MEN PO) Take 1 tablet by mouth daily.   Yes [provider]  OXYGEN Inhale 3 L into the lungs continuous.   Yes [provider]  QUEtiapine (SEROQUEL) 25 MG tablet Take 1 tablet (25 mg total) by mouth in the morning. 02/22/20  Yes Emokpae, Courage, MD  QUEtiapine (SEROQUEL) 50 MG tablet Take 1 tablet (50 mg total) by mouth at bedtime. 02/22/20  Yes Emokpae, Courage, MD  simvastatin (ZOCOR) 40 MG tablet Take 40 mg by mouth every evening.   Yes [provider]  tiotropium (SPIRIVA) 18 MCG inhalation capsule Place 1 capsule (18 mcg total) into inhaler and inhale daily. 02/22/20  Yes Emokpae, Courage, MD  traMADol (ULTRAM) 50 MG tablet Take 100 mg by mouth 2 (two) times daily as needed for moderate pain.   Yes [provider]  triamcinolone cream (KENALOG) 0.1 % Apply 1 application topically 2 (two) times daily. 02/11/20  Yes Avegno, Zachery Dakins, FNP  TRULICITY 3 MG/0.5ML SOPN Inject 3 mg into the skin every Friday. 06/07/20  Yes [provider]  acetaminophen (TYLENOL) 325 MG tablet Take 2 tablets (650 mg total) by mouth every 6 (six) hours as needed for mild pain, fever or headache (or Fever >/= 101). Patient not taking: No sig reported 02/22/20   Shon Hale, MD  blood glucose meter kit and supplies Relion Prime or Dispense other brand based on patient and insurance preference. Use up to four times daily as directed. (FOR ICD-9 250.00, 250.01). 02/22/20   Shon Hale, MD  blood glucose meter kit and supplies Dispense based on patient and insurance preference. Use up to four times daily as directed. (FOR ICD-9 250.00, 250.01).  --Otherwise may dispense ReliOn Prime glucometer, test strips and lancets 02/22/20   Shon Hale, MD  meclizine (ANTIVERT) 25 MG tablet Take  25 mg by mouth 3 (three) times daily as needed for dizziness. Patient not taking: Reported on 10/11/2020    [provider]  metoprolol tartrate (LOPRESSOR) 50 MG tablet Take 1 tablet (50 mg total) by mouth 2 (two) times daily. Patient not taking: No sig reported 02/22/20   Shon Hale, MD  Multiple Vitamins-Minerals (PRESERVISION AREDS 2+MULTI VIT PO) Take 2 tablets  by mouth in the morning and at bedtime. Patient not taking: Reported on 10/11/2020    [provider]  pantoprazole (PROTONIX) 40 MG tablet Take 1 tablet (40 mg total) by mouth 2 (two) times daily before a meal. Patient not taking: No sig reported 02/22/20   Roxan Hockey, MD  polyethylene glycol (MIRALAX / GLYCOLAX) 17 g packet Take 17 g by mouth daily. Patient not taking: No sig reported 02/23/20   Roxan Hockey, MD  predniSONE (DELTASONE) 20 MG tablet Take 2 tablets (40 mg total) by mouth daily with breakfast. Patient not taking: No sig reported 02/22/20   Roxan Hockey, MD  saw palmetto 160 MG capsule Take 160 mg by mouth 2 (two) times daily. Patient not taking: Reported on 10/11/2020    [provider]  triamcinolone cream (KENALOG) 0.1 % Apply 1 application topically 2 (two) times daily. Patient not taking: No sig reported 02/13/20   Wurst, [place], PA-C    Scheduled Meds: . aspirin  81 mg Oral Daily  . cholecalciferol  1,000 Units Oral BID  . docusate sodium  100 mg Oral BID  . DULoxetine  30 mg Oral q morning - 10a  . enoxaparin (LOVENOX) injection  40 mg Subcutaneous Q24H  . fluticasone furoate-vilanterol  1 puff Inhalation Daily  . gabapentin  600 mg Oral QHS  . insulin aspart  0-15 Units Subcutaneous TID WC  . insulin glargine  20 Units Subcutaneous QHS  . melatonin  6 mg Oral QHS  . pantoprazole  40 mg Oral BID AC  . QUEtiapine  25 mg Oral q AM  . QUEtiapine  50 mg Oral QHS  . simvastatin  40 mg Oral QPM  . umeclidinium bromide  1 puff Inhalation Daily   Continuous  Infusions: PRN Meds:.acetaminophen **OR** acetaminophen, albuterol, hydrOXYzine, ondansetron **OR** ondansetron (ZOFRAN) IV, polyvinyl alcohol, traMADol, triamcinolone     Results for orders placed or performed during the hospital encounter of 10/11/20 (from the past 48 hour(s))  CBC with Differential     Status: Abnormal   Collection Time: 10/11/20  3:45 PM  Result Value Ref Range   WBC 11.5 (H) 4.0 - 10.5 K/uL   RBC 4.62 4.22 - 5.81 MIL/uL   Hemoglobin 11.1 (L) 13.0 - 17.0 g/dL   HCT 38.8 (L) 39.0 - 52.0 %   MCV 84.0 80.0 - 100.0 fL   MCH 24.0 (L) 26.0 - 34.0 pg   MCHC 28.6 (L) 30.0 - 36.0 g/dL   RDW 15.3 11.5 - 15.5 %   Platelets 249 150 - 400 K/uL   nRBC 0.0 0.0 - 0.2 %   Neutrophils Relative % 70 %   Neutro Abs 8.2 (H) 1.7 - 7.7 K/uL   Lymphocytes Relative 19 %   Lymphs Abs 2.2 0.7 - 4.0 K/uL   Monocytes Relative 7 %   Monocytes Absolute 0.8 0.1 - 1.0 K/uL   Eosinophils Relative 3 %   Eosinophils Absolute 0.3 0.0 - 0.5 K/uL   Basophils Relative 1 %   Basophils Absolute 0.1 0.0 - 0.1 K/uL   Immature Granulocytes 0 %   Abs Immature Granulocytes 0.04 0.00 - 0.07 K/uL    Comment: Performed at [facility], [address] [place], [place] [address]  Comprehensive metabolic panel     Status: Abnormal   Collection Time: 10/11/20  3:45 PM  Result Value Ref Range   Sodium 137 135 - 145 mmol/L   Potassium 4.8 3.5 - 5.1 mmol/L   Chloride 95 (L)  98 - 111 mmol/L   CO2 33 (H) 22 - 32 mmol/L   Glucose, Bld 120 (H) 70 - 99 mg/dL    Comment: Glucose reference range applies only to samples taken after fasting for at least 8 hours.   BUN 14 8 - 23 mg/dL   Creatinine, Ser 1.11 0.61 - 1.24 mg/dL   Calcium 9.1 8.9 - 10.3 mg/dL   Total Protein 6.7 6.5 - 8.1 g/dL   Albumin 3.4 (L) 3.5 - 5.0 g/dL   AST 22 15 - 41 U/L   ALT 19 0 - 44 U/L   Alkaline Phosphatase 82 38 - 126 U/L   Total Bilirubin 0.4 0.3 - 1.2 mg/dL   GFR, Estimated >60 >60 mL/min    Comment: (NOTE) Calculated using the  CKD-EPI Creatinine Equation (2021)    Anion gap 9 5 - 15    Comment: Performed at [facility], [address] [place], [place] [address]  SARS CORONAVIRUS 2 (TAT 6-24 HRS) Nasopharyngeal Nasopharyngeal Swab     Status: None   Collection Time: 10/11/20  6:05 PM   Specimen: Nasopharyngeal Swab  Result Value Ref Range   SARS Coronavirus 2 NEGATIVE NEGATIVE    Comment: (NOTE) SARS-CoV-2 target nucleic acids are NOT DETECTED.  The SARS-CoV-2 RNA is generally detectable in upper and lower respiratory specimens during the acute phase of infection. Negative results do not preclude SARS-CoV-2 infection, do not rule out co-infections with other pathogens, and should not be used as the sole basis for treatment or other patient management decisions. Negative results must be combined with clinical observations, patient history, and epidemiological information. The expected result is Negative.  Fact Sheet for Patients: SugarRoll.be  Fact Sheet for Healthcare Providers: https://www.woods-mathews.com/  This test is not yet approved or cleared by the [place] FDA and  has been authorized for detection and/or diagnosis of SARS-CoV-2 by FDA under an Emergency Use Authorization (EUA). This EUA will remain  in effect (meaning this test can be used) for the duration of the COVID-19 declaration under Se ction 564(b)(1) of the Act, 21 U.S.C. section 360bbb-3(b)(1), unless the authorization is terminated or revoked sooner.  Performed at [facility], [place] [address], [place], [place] [address]   Ethanol     Status: None   Collection Time: 10/11/20  8:28 PM  Result Value Ref Range   Alcohol, Ethyl (B) <10 <10 mg/dL    Comment: (NOTE) Lowest detectable limit for serum alcohol is 10 mg/dL.  For medical purposes only. Performed at [facility], [address] [place], [place] [address]   Protime-INR     Status: Abnormal   Collection Time:  10/11/20  8:28 PM  Result Value Ref Range   Prothrombin Time 15.3 (H) 11.4 - 15.2 seconds   INR 1.3 (H) 0.8 - 1.2    Comment: (NOTE) INR goal varies based on device and disease states. Performed at [facility], [address], [place], [place] [address]   APTT     Status: None   Collection Time: 10/11/20  8:28 PM  Result Value Ref Range   aPTT 32 24 - 36 seconds    Comment: Performed at [facility], [address] [place],  [address]  Hemoglobin A1c     Status: Abnormal   Collection Time: 10/12/20  4:58 AM  Result Value Ref Range   Hgb A1c MFr Bld 6.7 (H) 4.8 - 5.6 %    Comment: (NOTE) Pre diabetes:  5.7%-6.4%  Diabetes:              >6.4%  Glycemic control for   <7.0% adults with diabetes    Mean Plasma Glucose 145.59 mg/dL    Comment: Performed at [facility] [address], [place], [place] [address]  Lipid panel     Status: Abnormal   Collection Time: 10/12/20  4:58 AM  Result Value Ref Range   Cholesterol 165 0 - 200 mg/dL   Triglycerides 190 (H) <150 mg/dL   HDL 39 (L) >40 mg/dL   Total CHOL/HDL Ratio 4.2 RATIO   VLDL 38 0 - 40 mg/dL   LDL Cholesterol 88 0 - 99 mg/dL    Comment:        Total Cholesterol/HDL:CHD Risk Coronary Heart Disease Risk Table                     Men   Women  1/2 Average Risk   3.4   3.3  Average Risk       5.0   4.4  2 X Average Risk   9.6   7.1  3 X Average Risk  23.4   11.0        Use the calculated Patient Ratio above and the CHD Risk Table to determine the patient's CHD Risk.        ATP III CLASSIFICATION (LDL):  <100     mg/dL   Optimal  100-129  mg/dL   Near or Above                    Optimal  130-159  mg/dL   Borderline  160-189  mg/dL   High  >190     mg/dL   Very High Performed at [facility], [address], [place], [place] [address]   CBC     Status: Abnormal   Collection Time: 10/12/20  4:58 AM  Result Value Ref Range   WBC 10.4 4.0 - 10.5 K/uL   RBC 4.92 4.22 - 5.81 MIL/uL   Hemoglobin  12.1 (L) 13.0 - 17.0 g/dL   HCT 41.8 39.0 - 52.0 %   MCV 85.0 80.0 - 100.0 fL   MCH 24.6 (L) 26.0 - 34.0 pg   MCHC 28.9 (L) 30.0 - 36.0 g/dL   RDW 15.2 11.5 - 15.5 %   Platelets 249 150 - 400 K/uL   nRBC 0.0 0.0 - 0.2 %    Comment: Performed at [facility], [address] [place], [place] [address]  Basic metabolic panel     Status: Abnormal   Collection Time: 10/12/20  4:58 AM  Result Value Ref Range   Sodium 136 135 - 145 mmol/L   Potassium 4.2 3.5 - 5.1 mmol/L   Chloride 94 (L) 98 - 111 mmol/L   CO2 32 22 - 32 mmol/L   Glucose, Bld 108 (H) 70 - 99 mg/dL    Comment: Glucose reference range applies only to samples taken after fasting for at least 8 hours.   BUN 14 8 - 23 mg/dL   Creatinine, Ser 0.98 0.61 - 1.24 mg/dL   Calcium 9.4 8.9 - 10.3 mg/dL   GFR, Estimated >60 >60 mL/min    Comment: (NOTE) Calculated using the CKD-EPI Creatinine Equation (2021)    Anion gap 10 5 - 15    Comment: Performed at [facility], [address] [place], [place] [address]  Glucose, capillary     Status: Abnormal  Collection Time: 10/12/20  8:38 AM  Result Value Ref Range   Glucose-Capillary 143 (H) 70 - 99 mg/dL    Comment: Glucose reference range applies only to samples taken after fasting for at least 8 hours.  Glucose, capillary     Status: Abnormal   Collection Time: 10/12/20 11:04 AM  Result Value Ref Range   Glucose-Capillary 185 (H) 70 - 99 mg/dL    Comment: Glucose reference range applies only to samples taken after fasting for at least 8 hours.  Glucose, capillary     Status: Abnormal   Collection Time: 10/12/20  4:24 PM  Result Value Ref Range   Glucose-Capillary 137 (H) 70 - 99 mg/dL    Comment: Glucose reference range applies only to samples taken after fasting for at least 8 hours.  Glucose, capillary     Status: Abnormal   Collection Time: 10/12/20  9:55 PM  Result Value Ref Range   Glucose-Capillary 129 (H) 70 - 99 mg/dL    Comment: Glucose reference range applies only  to samples taken after fasting for at least 8 hours.  Basic metabolic panel     Status: Abnormal   Collection Time: 10/13/20  6:48 AM  Result Value Ref Range   Sodium 130 (L) 135 - 145 mmol/L   Potassium 4.3 3.5 - 5.1 mmol/L   Chloride 92 (L) 98 - 111 mmol/L   CO2 31 22 - 32 mmol/L   Glucose, Bld 122 (H) 70 - 99 mg/dL    Comment: Glucose reference range applies only to samples taken after fasting for at least 8 hours.   BUN 9 8 - 23 mg/dL   Creatinine, Ser 0.85 0.61 - 1.24 mg/dL   Calcium 9.1 8.9 - 10.3 mg/dL   GFR, Estimated >60 >60 mL/min    Comment: (NOTE) Calculated using the CKD-EPI Creatinine Equation (2021)    Anion gap 7 5 - 15    Comment: Performed at [facility], [address] [place], [place] [address]  CBC     Status: Abnormal   Collection Time: 10/13/20  6:48 AM  Result Value Ref Range   WBC 10.6 (H) 4.0 - 10.5 K/uL   RBC 4.44 4.22 - 5.81 MIL/uL   Hemoglobin 10.8 (L) 13.0 - 17.0 g/dL   HCT 35.9 (L) 39.0 - 52.0 %   MCV 80.9 80.0 - 100.0 fL   MCH 24.3 (L) 26.0 - 34.0 pg   MCHC 30.1 30.0 - 36.0 g/dL   RDW 14.6 11.5 - 15.5 %   Platelets 234 150 - 400 K/uL   nRBC 0.0 0.0 - 0.2 %    Comment: Performed at [facility], [address] [place], [place] [address]  Glucose, capillary     Status: Abnormal   Collection Time: 10/13/20  8:02 AM  Result Value Ref Range   Glucose-Capillary 145 (H) 70 - 99 mg/dL    Comment: Glucose reference range applies only to samples taken after fasting for at least 8 hours.    Studies/Results:      Vitamin B12 586 June 2021  BRAIN MRI  FINDINGS: MRI HEAD FINDINGS  Brain:  Multiple sequences are significantly motion degraded, limiting evaluation. Most notably, there is moderate motion degradation of the sagittal T1 weighted sequence, mild/moderate motion degradation of the axial T2 weighted sequence, severe motion degradation of the axial T2* sequence, severe motion degradation of the axial T1 weighted sequence, moderate  motion degradation of the coronal T2 weighted sequence and moderate motion degradation of  the axial T2/FLAIR sequence.  Moderate cerebral atrophy.  Comparatively mild cerebellar atrophy.  7 mm acute infarct within the right pons (series 5, image 11) (series 7, image 17).  Chronic right thalamic lacunar infarct.  Moderately severe multifocal T2/FLAIR hyperintensity within the cerebral white matter is nonspecific, but compatible with chronic small vessel ischemic disease.  No evidence of intracranial mass.  No chronic intracranial blood products.  No extra-axial fluid collection.  No midline shift.  Vascular: Reported below.  Skull and upper cervical spine: No focal marrow lesion.  Sinuses/Orbits: Visualized orbits show no acute finding. No significant paranasal sinus disease.  Other: Trace fluid within the left mastoid air cells.  MRA HEAD FINDINGS  Mild to moderately motion degraded exam.  The intracranial internal carotid arteries are patent. Apparent high-grade stenosis within the proximal cavernous left ICA, which could be exaggerated by motion artifact on the current exam. The M1 middle cerebral arteries are patent. Apparent moderate stenosis within the distal M1 left middle cerebral artery. Additionally, there are apparent high-grade stenoses within bilateral proximal to mid M2 MCA branches. The A1 left ACA is developmentally hypoplastic or absent. The anterior cerebral arteries are otherwise patent. Apparent moderate stenosis within the proximal A2 left ACA.  1-2 mm inferiorly projecting vascular protrusion arising from the paraclinoid right ICA which may reflect an aneurysm or infundibulum (series 1045, image 15).  There is a marked paucity of flow related signal arising from the intracranial right vertebral artery and this vessel is likely occluded. The left vertebral artery is patent without significant stenosis. The basilar artery is  patent with mild atherosclerotic irregularity. The posterior cerebral arteries are patent. Apparent multifocal severe stenoses within the P2 right posterior cerebral artery. Apparent multifocal. Apparent moderate focal stenosis within the proximal P2 left PCA. 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 A left posterior communicating artery is present. The right posterior communicating artery is hypoplastic or absent.  IMPRESSION: MRI brain:  1. Significantly motion degraded exam. 2. 7 mm acute infarct within the right pons. 3. Chronic right thalamic lacunar infarct. 4. Background moderate cerebral atrophy and moderately severe cerebral white matter chronic small vessel ischemic disease.  MRA head:  1. Mild to moderately motion degraded exam. 2. Paucity of flow-related signal arising from the intracranial right vertebral artery. This vessel is likely occluded. 3. The basilar artery is patent with mild atherosclerotic irregularity. 4. Additional apparent intracranial stenoses as follows (please note these apparent stenoses may be exaggerated by motion artifact on the current exam). 5. Apparent high-grade stenosis within the proximal cavernous left ICA. 6. Apparent moderate stenosis within the distal M1 left MCA. 7. Apparent high-grade stenoses within bilateral proximal to mid M2 MCA branches. 8. Apparent moderate stenosis with proximal A2 left ACA. 9. Apparent multifocal severe stenoses within the P2 right PCA. 10. Apparent moderate focal stenosis within the proximal P2 left PCA. 11. 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   TTE 1. Left ventricular ejection fraction, by estimation, is 55 to 60%. The  left ventricle has normal function. The left ventricle has no regional  wall motion abnormalities. There is moderate left ventricular hypertrophy.  Left ventricular diastolic  parameters are indeterminate.  2. Right  ventricular systolic function is normal. The right ventricular  size is normal. There is normal pulmonary artery systolic pressure. The  estimated right ventricular systolic pressure is 15.7 mmHg.  3. The mitral valve is grossly normal. Trivial mitral valve  regurgitation.  4. The aortic valve has an indeterminant number of cusps. Aortic valve  regurgitation is not visualized. Mild to moderate aortic valve  sclerosis/calcification is present, without any evidence of aortic  stenosis.  5. The inferior vena cava is normal in size with greater than 50%  respiratory variability, suggesting right atrial pressure of 3 mmHg.    CAROTID  IMPRESSION: Right:  Heterogeneous plaque at the right carotid bifurcation, with discordant results regarding degree of stenosis by established duplex criteria. 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 If establishing a more accurate degree of stenosis is required, cerebral angiogram should be considered, or as a second best test, CTA.  Left:  Color duplex indicates moderate heterogeneous plaque with no hemodynamically significant stenosis by duplex criteria in the left extracranial cerebrovascular circulation.        Rip Hawes A. Merlene Laughter, M.D.  Diplomate, [employer] ( Neurology). 10/13/2020, 8:46 AM

## 2020-10-13 NOTE — Progress Notes (Signed)
Progress Note    Rick Cruz  [id number] DOB: Jan 24, 1946  DOA: 10/11/2020 PCP: Vassie Moment, NP (Inactive)    Brief Narrative:    Medical records reviewed and are as summarized below:  Rick Cruz is an 75 y.o. male with medical history significant of osteoarthritis, history of other nonhemorrhagic stroke, right eye blindness, chronic diastolic heart failure, chronic respiratory failure due to COPD on home oxygen, sleep apnea noncompliant with BiPAP, history of tobacco abuse, type II DM, hyperlipidemia, hypertension, history of unspecified schizophrenia who is coming to the emergency department due to right lower extremity weakness for the past 2 weeks.   Assessment/Plan:   Principal Problem:   Ischemic cerebrovascular accident (CVA) ([place]) Active Problems:   Obstructive sleep apnea   Essential hypertension   Paranoid schizophrenia ([place])   Type 2 diabetes mellitus without complication, with long-term current use of insulin (HCC)   Chronic respiratory failure 02 dep with hypercarbia   Morbid obesity (HCC)   Chronic diastolic CHF (congestive heart failure) (HCC)   Paroxysmal atrial fibrillation (HCC)   Depression   Diabetic neuropathy (HCC)   Dyslipidemia   Gastroesophageal reflux disease   Class 1 obesity   [name]   Ischemic cerebrovascular accident (CVA) : MRI: acute right pontine infarct and 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  - neurochecks. -passed Swallow screen in ER. -PT/OT/SLP.- home health -LDL: 88 (on statin) -hemoglobin A1c: 6.7 (goal <7) -carotid Doppler: right: Peak velocity suggests 50%-69% stenosis, with the ICA/ CCA ratio suggesting a lesser degree of stenosis. If establishing a more accurate degree of stenosis is required, cerebral angiogram should be considered, or as a second best test, CTA. --echocardiogram: Left ventricular ejection fraction, by estimation, is 55 to 60%. The  left ventricle  has normal function. The left ventricle has no regional  wall motion abnormalities. There is moderate left ventricular hypertrophy.  Left ventricular diastolic  parameters are indeterminate. -Apixaban held per neuro request. -Continue aspirin 81 mg p.o. daily. -Consulted neurology- await recommendations    Essential hypertension Allow permissive hypertension in light of recent CVA    Type 2 diabetes mellitus without complication, with long-term current use of insulin (HCC) Carbohydrate modified diet. Continue Lantus 20 units at bedtime. -SSI -hemoglobin A1c <7    Chronic respiratory failure  with hypercarbia Continue supplemental oxygen- goal sat of 88-92% Continue Trelegy Ellipta daily. Continuous Spiriva daily. Rescue MDI or nebs as needed.  Hyponatremia -monitor -has been low in the past as well- responded to IVF    Chronic diastolic CHF (congestive heart failure) (HCC) No signs of decompensation. Holding beta-blocker to allow for permissive HTN    Paroxysmal atrial fibrillation (HCC) [place] VASc Score of at least 5. Off apixaban due to CVA (per ER: Neurology was contacted who recommended to stop apixaban and begin aspirin) Off metoprolol to allow permissive hypertension. Continue cardiac telemetry.    Paranoid schizophrenia (HCC) Continue Seroquel 25 mg in the morning. Continue Seroquel 50 mg at bedtime. On duloxetine for depression.    Depression Continue duloxetine 30 mg p.o. daily.    Diabetic neuropathy (HCC) Continue gabapentin 600 mg p.o. bedtime.    Dyslipidemia On simvastatin- not at goal -LDL: 88 -goal <70    Obstructive sleep apnea -does not wear CPAP  obesity Body mass index is 32.14 kg/m.   Family Communication/Anticipated D/C date and plan/Code Status   DVT prophylaxis: Lovenox ordered. Code Status: Full Code.  Disposition Plan: Status is: inpt  The patient will require care spanning > 2 midnights and should be moved to  inpatient because: Inpatient level of care appropriate due to severity of illness  Dispo: The patient is from: Home              Anticipated d/c is to: tbd              Anticipated d/c date is: 2 days              Patient currently is not medically stable to d/c.- await neurology recommendations   Difficult to place patient No         Medical Consultants:    Neurology  Subjective:   Asking about going home  Objective:    Vitals:   10/12/20 1941 10/12/20 2151 10/13/20 0535 10/13/20 0813  BP:  (!) 157/93 (!) 167/105   Pulse:  97 94   Resp:  19 17   Temp:  98.2 F (36.8 C) 98.6 F (37 C)   TempSrc:      SpO2: (!) 89% 92% 98% 96%  Weight:      Height:        Intake/Output Summary (Last 24 hours) at 10/13/2020 1338 Last data filed at 10/13/2020 1000 Gross per 24 hour  Intake 480 ml  Output 500 ml  Net -20 ml   Filed Weights   10/11/20 1155  Weight: 107.5 kg    Exam:  General: Appearance:    Obese male in no acute distress     Lungs:     respirations unlabored  Heart:    Normal heart rate.  No murmurs, rubs, or gallops.   MS:   All extremities are intact.   Neurologic:   Awake, alert, pleasant and cooperative     Data Reviewed:   I have personally reviewed following labs and imaging studies:  Labs: Labs show the following:   Basic Metabolic Panel: Recent Labs  Lab 10/11/20 1545 10/12/20 0458 10/13/20 0648  NA 137 136 130*  K 4.8 4.2 4.3  CL 95* 94* 92*  CO2 33* 32 31  GLUCOSE 120* 108* 122*  BUN '14 14 9  '$ CREATININE 1.11 0.98 0.85  CALCIUM 9.1 9.4 9.1   GFR Estimated Creatinine Clearance: 98.1 mL/min (by C-G formula based on SCr of 0.85 mg/dL). Liver Function Tests: Recent Labs  Lab 10/11/20 1545  AST 22  ALT 19  ALKPHOS 82  BILITOT 0.4  PROT 6.7  ALBUMIN 3.4*   No results for input(s): LIPASE, AMYLASE in the last 168 hours. No results for input(s): AMMONIA in the last 168 hours. Coagulation profile Recent Labs  Lab  10/11/20 2028  INR 1.3*    CBC: Recent Labs  Lab 10/11/20 1545 10/12/20 0458 10/13/20 0648  WBC 11.5* 10.4 10.6*  NEUTROABS 8.2*  --   --   HGB 11.1* 12.1* 10.8*  HCT 38.8* 41.8 35.9*  MCV 84.0 85.0 80.9  PLT 249 249 234   Cardiac Enzymes: No results for input(s): CKTOTAL, CKMB, CKMBINDEX, TROPONINI in the last 168 hours. BNP (last 3 results) No results for input(s): PROBNP in the last 8760 hours. CBG: Recent Labs  Lab 10/12/20 0838 10/12/20 1104 10/12/20 1624 10/12/20 2155 10/13/20 0802  GLUCAP 143* 185* 137* 129* 145*   D-Dimer: No results for input(s): DDIMER in the last 72 hours. Hgb A1c: Recent Labs    10/12/20 0458  HGBA1C 6.7*   Lipid Profile: Recent Labs    10/12/20 0458  CHOL 165  HDL 39*  LDLCALC 88  TRIG 190*  CHOLHDL 4.2   Thyroid function studies: No results for input(s): TSH, T4TOTAL, T3FREE, THYROIDAB in the last 72 hours.  Invalid input(s): FREET3 Anemia work up: No results for input(s): VITAMINB12, FOLATE, FERRITIN, TIBC, IRON, RETICCTPCT in the last 72 hours. Sepsis Labs: Recent Labs  Lab 10/11/20 1545 10/12/20 0458 10/13/20 0648  WBC 11.5* 10.4 10.6*    Microbiology Recent Results (from the past 240 hour(s))  SARS CORONAVIRUS 2 (TAT 6-24 HRS) Nasopharyngeal Nasopharyngeal Swab     Status: None   Collection Time: 10/11/20  6:05 PM   Specimen: Nasopharyngeal Swab  Result Value Ref Range Status   SARS Coronavirus 2 NEGATIVE NEGATIVE Final    Comment: (NOTE) SARS-CoV-2 target nucleic acids are NOT DETECTED.  The SARS-CoV-2 RNA is generally detectable in upper and lower respiratory specimens during the acute phase of infection. Negative results do not preclude SARS-CoV-2 infection, do not rule out co-infections with other pathogens, and should not be used as the sole basis for treatment or other patient management decisions. Negative results must be combined with clinical observations, patient history, and epidemiological  information. The expected result is Negative.  Fact Sheet for Patients: SugarRoll.be  Fact Sheet for Healthcare Providers: https://www.woods-mathews.com/  This test is not yet approved or cleared by the [place] FDA and  has been authorized for detection and/or diagnosis of SARS-CoV-2 by FDA under an Emergency Use Authorization (EUA). This EUA will remain  in effect (meaning this test can be used) for the duration of the COVID-19 declaration under Se ction 564(b)(1) of the Act, 21 U.S.C. section 360bbb-3(b)(1), unless the authorization is terminated or revoked sooner.  Performed at [facility], [place] [address], [place], [name] [address]     Procedures and diagnostic studies:  CT Head Wo Contrast  Result Date: 10/11/2020 CLINICAL DATA:  Left leg weakness EXAM: CT HEAD WITHOUT CONTRAST TECHNIQUE: Contiguous axial images were obtained from the base of the skull through the vertex without intravenous contrast. COMPARISON:  CT brain 02/18/2020, brain MRI 04/27/2019 FINDINGS: Brain: No acute territorial infarction, hemorrhage, or intracranial mass. Moderate atrophy. Moderate hypodensity in the white matter consistent with chronic small vessel ischemic change. Possible age indeterminate lacunar infarct in the right thalamus. Stable ventricle size. Vascular: No hyperdense vessels.  Carotid vascular calcification Skull: Normal. Negative for fracture or focal lesion. Sinuses/Orbits: No acute finding. Other: None IMPRESSION: 1. Possible age indeterminate lacunar infarct in the right thalamus. Otherwise no CT evidence for acute intracranial abnormality. 2. Atrophy and chronic small vessel ischemic changes of the white matter. Electronically Signed   By: Donavan Foil M.D.   On: 10/11/2020 15:36   MR ANGIO HEAD WO CONTRAST  Addendum Date: 10/11/2020   ADDENDUM REPORT: 10/11/2020 17:38 ADDENDUM: Findings of acute right pontine infarct and likely  occlusion of the right vertebral artery called by telephone at the time of interpretation on 10/11/2020 at 5:38 pm to provider PA Alroy Bailiff, who verbally acknowledged these results. Electronically Signed   By: Kellie Simmering DO   On: 10/11/2020 17:38   Result Date: 10/11/2020 CLINICAL DATA:  Stroke, follow-up. Additional history provided: Fatigue, bilateral weakness for 2 days, no known injury. EXAM: MRI HEAD WITHOUT CONTRAST MRA HEAD WITHOUT CONTRAST TECHNIQUE: Multiplanar, multiecho pulse sequences of the brain and surrounding structures were obtained without intravenous contrast. Angiographic images of the head were obtained using MRA technique without contrast. COMPARISON:  Head CT 10/11/2020. brain MRI 04/27/2019. FINDINGS: MRI HEAD FINDINGS  Brain: Multiple sequences are significantly motion degraded, limiting evaluation. Most notably, there is moderate motion degradation of the sagittal T1 weighted sequence, mild/moderate motion degradation of the axial T2 weighted sequence, severe motion degradation of the axial T2* sequence, severe motion degradation of the axial T1 weighted sequence, moderate motion degradation of the coronal T2 weighted sequence and moderate motion degradation of the axial T2/FLAIR sequence. Moderate cerebral atrophy.  Comparatively mild cerebellar atrophy. 7 mm acute infarct within the right pons (series 5, image 11) (series 7, image 17). Chronic right thalamic lacunar infarct. Moderately severe multifocal T2/FLAIR hyperintensity within the cerebral white matter is nonspecific, but compatible with chronic small vessel ischemic disease. No evidence of intracranial mass. No chronic intracranial blood products. No extra-axial fluid collection. No midline shift. Vascular: Reported below. Skull and upper cervical spine: No focal marrow lesion. Sinuses/Orbits: Visualized orbits show no acute finding. No significant paranasal sinus disease. Other: Trace fluid within the left mastoid air cells. MRA  HEAD FINDINGS Mild to moderately motion degraded exam. The intracranial internal carotid arteries are patent. Apparent high-grade stenosis within the proximal cavernous left ICA, which could be exaggerated by motion artifact on the current exam. The M1 middle cerebral arteries are patent. Apparent moderate stenosis within the distal M1 left middle cerebral artery. Additionally, there are apparent high-grade stenoses within bilateral proximal to mid M2 MCA branches. The A1 left ACA is developmentally hypoplastic or absent. The anterior cerebral arteries are otherwise patent. Apparent moderate stenosis within the proximal A2 left ACA. 1-2 mm inferiorly projecting vascular protrusion arising from the paraclinoid right ICA which may reflect an aneurysm or infundibulum (series 1045, image 15). There is a marked paucity of flow related signal arising from the intracranial right vertebral artery and this vessel is likely occluded. The left vertebral artery is patent without significant stenosis. The basilar artery is patent with mild atherosclerotic irregularity. The posterior cerebral arteries are patent. Apparent multifocal severe stenoses within the P2 right posterior cerebral artery. Apparent multifocal. Apparent moderate focal stenosis within the proximal P2 left PCA. 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 A left posterior communicating artery is present. The right posterior communicating artery is hypoplastic or absent. IMPRESSION: MRI brain: 1. Significantly motion degraded exam. 2. 7 mm acute infarct within the right pons. 3. Chronic right thalamic lacunar infarct. 4. Background moderate cerebral atrophy and moderately severe cerebral white matter chronic small vessel ischemic disease. MRA head: 1. Mild to moderately motion degraded exam. 2. Paucity of flow-related signal arising from the intracranial right vertebral artery. This vessel is likely occluded. 3. The basilar artery is  patent with mild atherosclerotic irregularity. 4. Additional apparent intracranial stenoses as follows (please note these apparent stenoses may be exaggerated by motion artifact on the current exam). 5. Apparent high-grade stenosis within the proximal cavernous left ICA. 6. Apparent moderate stenosis within the distal M1 left MCA. 7. Apparent high-grade stenoses within bilateral proximal to mid M2 MCA branches. 8. Apparent moderate stenosis with proximal A2 left ACA. 9. Apparent multifocal severe stenoses within the P2 right PCA. 10. Apparent moderate focal stenosis within the proximal P2 left PCA. 11. 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 Electronically Signed: By: Kellie Simmering DO On: 10/11/2020 17:35   MR BRAIN WO CONTRAST  Addendum Date: 10/11/2020   ADDENDUM REPORT: 10/11/2020 17:38 ADDENDUM: Findings of acute right pontine infarct and 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 called by telephone at the time of interpretation on  10/11/2020 at 5:38 pm to provider PA Alroy Bailiff, who verbally acknowledged these results. Electronically Signed   By: Kellie Simmering DO   On: 10/11/2020 17:38   Result Date: 10/11/2020 CLINICAL DATA:  Stroke, follow-up. Additional history provided: Fatigue, bilateral weakness for 2 days, no known injury. EXAM: MRI HEAD WITHOUT CONTRAST MRA HEAD WITHOUT CONTRAST TECHNIQUE: Multiplanar, multiecho pulse sequences of the brain and surrounding structures were obtained without intravenous contrast. Angiographic images of the head were obtained using MRA technique without contrast. COMPARISON:  Head CT 10/11/2020. brain MRI 04/27/2019. FINDINGS: MRI HEAD FINDINGS Brain: Multiple sequences are significantly motion degraded, limiting evaluation. Most notably, there is moderate motion degradation of the sagittal T1 weighted sequence, mild/moderate motion degradation of the axial T2 weighted sequence, severe motion degradation of the axial T2* sequence, severe  motion degradation of the axial T1 weighted sequence, moderate motion degradation of the coronal T2 weighted sequence and moderate motion degradation of the axial T2/FLAIR sequence. Moderate cerebral atrophy.  Comparatively mild cerebellar atrophy. 7 mm acute infarct within the right pons (series 5, image 11) (series 7, image 17). Chronic right thalamic lacunar infarct. Moderately severe multifocal T2/FLAIR hyperintensity within the cerebral white matter is nonspecific, but compatible with chronic small vessel ischemic disease. No evidence of intracranial mass. No chronic intracranial blood products. No extra-axial fluid collection. No midline shift. Vascular: Reported below. Skull and upper cervical spine: No focal marrow lesion. Sinuses/Orbits: Visualized orbits show no acute finding. No significant paranasal sinus disease. Other: Trace fluid within the left mastoid air cells. MRA HEAD FINDINGS Mild to moderately motion degraded exam. The intracranial internal carotid arteries are patent. Apparent high-grade stenosis within the proximal cavernous left ICA, which could be exaggerated by motion artifact on the current exam. The M1 middle cerebral arteries are patent. Apparent moderate stenosis within the distal M1 left middle cerebral artery. Additionally, there are apparent high-grade stenoses within bilateral proximal to mid M2 MCA branches. The A1 left ACA is developmentally hypoplastic or absent. The anterior cerebral arteries are otherwise patent. Apparent moderate stenosis within the proximal A2 left ACA. 1-2 mm inferiorly projecting vascular protrusion arising from the paraclinoid right ICA which may reflect an aneurysm or infundibulum (series 1045, image 15). There is a marked paucity of flow related signal arising from the intracranial right vertebral artery and this vessel is likely occluded. The left vertebral artery is patent without significant stenosis. The basilar artery is patent with mild  atherosclerotic irregularity. The posterior cerebral arteries are patent. Apparent multifocal severe stenoses within the P2 right posterior cerebral artery. Apparent multifocal. Apparent moderate focal stenosis within the proximal P2 left PCA. 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 A left posterior communicating artery is present. The right posterior communicating artery is hypoplastic or absent. IMPRESSION: MRI brain: 1. Significantly motion degraded exam. 2. 7 mm acute infarct within the right pons. 3. Chronic right thalamic lacunar infarct. 4. Background moderate cerebral atrophy and moderately severe cerebral white matter chronic small vessel ischemic disease. MRA head: 1. Mild to moderately motion degraded exam. 2. Paucity of flow-related signal arising from the intracranial right vertebral artery. This vessel is likely occluded. 3. The basilar artery is patent with mild atherosclerotic irregularity. 4. Additional apparent intracranial stenoses as follows (please note these apparent stenoses may be exaggerated by motion artifact on the current exam). 5. Apparent high-grade stenosis within the proximal cavernous left ICA. 6. Apparent moderate stenosis within the distal M1 left MCA. 7. Apparent high-grade stenoses within bilateral  proximal to mid M2 MCA branches. 8. Apparent moderate stenosis with proximal A2 left ACA. 9. Apparent multifocal severe stenoses within the P2 right PCA. 10. Apparent moderate focal stenosis within the proximal P2 left PCA. 11. 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 Electronically Signed: By: Kellie Simmering DO On: 10/11/2020 17:35   US Carotid Bilateral (at [facility] and [facility] only)  Result Date: 10/12/2020 CLINICAL DATA:  75 year old male with a history of stroke EXAM: BILATERAL CAROTID DUPLEX ULTRASOUND TECHNIQUE: Pearline Cables scale imaging, color Doppler and duplex ultrasound were performed of bilateral carotid and vertebral arteries in the  neck. COMPARISON:  None. FINDINGS: Criteria: Quantification of carotid stenosis is based on velocity parameters that correlate the residual internal carotid diameter with NASCET-based stenosis levels, using the diameter of the distal internal carotid lumen as the denominator for stenosis measurement. The following velocity measurements were obtained: RIGHT ICA:  Systolic XX123456 cm/sec, Diastolic 27 cm/sec CCA:  75 cm/sec SYSTOLIC ICA/CCA RATIO:  1.9 ECA:  153 cm/sec LEFT ICA:  Systolic 51 cm/sec, Diastolic 12 cm/sec CCA:  75 cm/sec SYSTOLIC ICA/CCA RATIO:  0.7 ECA:  117 cm/sec Right Brachial SBP: Not acquired Left Brachial SBP: Not acquired RIGHT CAROTID ARTERY: No significant calcified disease of the right common carotid artery. Intermediate waveform maintained. Heterogeneous plaque without significant calcifications at the right carotid bifurcation. Low resistance waveform of the right ICA. No significant tortuosity. RIGHT VERTEBRAL ARTERY: Antegrade flow with low resistance waveform. LEFT CAROTID ARTERY: No significant calcified disease of the left common carotid artery. Intermediate waveform maintained. Heterogeneous plaque at the left carotid bifurcation without significant calcifications. Low resistance waveform of the left ICA. LEFT VERTEBRAL ARTERY:  Antegrade flow with low resistance waveform. IMPRESSION: Right: Heterogeneous plaque at the right carotid bifurcation, with discordant results regarding degree of stenosis by established duplex criteria. 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 If establishing a more accurate degree of stenosis is required, cerebral angiogram should be considered, or as a second best test, CTA. Left: Color duplex indicates moderate heterogeneous plaque with no hemodynamically significant stenosis by duplex criteria in the left extracranial cerebrovascular circulation. Signed, Dulcy Fanny. Dellia Nims, RPVI [facility] Electronically Signed   By: Corrie Mckusick D.O.   On: 10/12/2020 11:11   ECHOCARDIOGRAM COMPLETE  Result Date: 10/12/2020    ECHOCARDIOGRAM REPORT   Patient Name:   TENNESSEE ROUTSON Date of Exam: 10/12/2020 Medical Rec #:  [id number]      Height:       72.0 in Accession #:    [id number]     Weight:       237.0 lb Date of Birth:  1946-08-31      BSA:          2.290 m Patient Age:    37 years       BP:           169/81 mmHg Patient Gender: M              HR:           93 bpm. Exam Location:  [name] Procedure: 2D Echo, Cardiac Doppler and Color Doppler Indications:    CVA  History:        Patient has prior history of Echocardiogram examinations, most                 recent 05/26/2016. CHF, Stroke and  COPD; Risk Factors:Current                 Smoker, Hypertension, Dyslipidemia, Diabetes, Sleep Apnea and                 Obesity.  Sonographer:    Dustin Flock RDCS Referring Phys: [id number] [place]  1. Left ventricular ejection fraction, by estimation, is 55 to 60%. The left ventricle has normal function. The left ventricle has no regional wall motion abnormalities. There is moderate left ventricular hypertrophy. Left ventricular diastolic parameters are indeterminate.  2. Right ventricular systolic function is normal. The right ventricular size is normal. There is normal pulmonary artery systolic pressure. The estimated right ventricular systolic pressure is 123XX123 mmHg.  3. The mitral valve is grossly normal. Trivial mitral valve regurgitation.  4. The aortic valve has an indeterminant number of cusps. Aortic valve regurgitation is not visualized. Mild to moderate aortic valve sclerosis/calcification is present, without any evidence of aortic stenosis.  5. The inferior vena cava is normal in size with greater than 50% respiratory variability, suggesting right atrial pressure of 3 mmHg. FINDINGS  Left Ventricle: Left ventricular ejection  fraction, by estimation, is 55 to 60%. The left ventricle has normal function. The left ventricle has no regional wall motion abnormalities. The left ventricular internal cavity size was normal in size. There is  moderate left ventricular hypertrophy. Left ventricular diastolic parameters are indeterminate. Right Ventricle: The right ventricular size is normal. No increase in right ventricular wall thickness. Right ventricular systolic function is normal. There is normal pulmonary artery systolic pressure. The tricuspid regurgitant velocity is 2.25 m/s, and  with an assumed right atrial pressure of 3 mmHg, the estimated right ventricular systolic pressure is 123XX123 mmHg. Left Atrium: Left atrial size was normal in size. Right Atrium: Right atrial size was normal in size. Pericardium: There is no evidence of pericardial effusion. Mitral Valve: The mitral valve is grossly normal. Mild mitral annular calcification. Trivial mitral valve regurgitation. Tricuspid Valve: The tricuspid valve is grossly normal. Tricuspid valve regurgitation is trivial. Aortic Valve: The aortic valve has an indeterminant number of cusps. There is mild to moderate aortic valve annular calcification. Aortic valve regurgitation is not visualized. Mild to moderate aortic valve sclerosis/calcification is present, without any  evidence of aortic stenosis. Pulmonic Valve: The pulmonic valve was grossly normal. Pulmonic valve regurgitation is trivial. Aorta: The aortic root is normal in size and structure. Venous: The inferior vena cava is normal in size with greater than 50% respiratory variability, suggesting right atrial pressure of 3 mmHg. IAS/Shunts: No atrial level shunt detected by color flow Doppler.  LEFT VENTRICLE PLAX 2D LVIDd:         5.00 cm  Diastology LVIDs:         3.20 cm  LV e' medial:    6.09 cm/s LV PW:         1.50 cm  LV E/e' medial:  10.7 LV IVS:        1.40 cm  LV e' lateral:   9.46 cm/s LVOT diam:     2.20 cm  LV E/e' lateral:  6.9 LV SV:         86 LV SV Index:   38 LVOT Area:     3.80 cm  RIGHT VENTRICLE RV Basal diam:  3.10 cm TAPSE (M-mode): 2.5 cm LEFT ATRIUM             Index  RIGHT ATRIUM           Index LA diam:        3.40 cm 1.48 cm/m  RA Area:     14.90 cm LA Vol (A2C):   60.1 ml 26.25 ml/m RA Volume:   39.90 ml  17.42 ml/m LA Vol (A4C):   73.4 ml 32.05 ml/m LA Biplane Vol: 70.7 ml 30.87 ml/m  AORTIC VALVE LVOT Vmax:   109.00 cm/s LVOT Vmean:  71.300 cm/s LVOT VTI:    0.226 m  AORTA Ao Root diam: 3.70 cm MITRAL VALVE               TRICUSPID VALVE MV Area (PHT): 2.78 cm    TR Peak grad:   20.2 mmHg MV Decel Time: 273 msec    TR Vmax:        225.00 cm/s MV E velocity: 65.00 cm/s MV A velocity: 97.50 cm/s  SHUNTS MV E/A ratio:  0.67        Systemic VTI:  0.23 m                            Systemic Diam: 2.20 cm Rozann Lesches MD Electronically signed by Rozann Lesches MD Signature Date/Time: 10/12/2020/3:40:21 PM    Final     Medications:   . aspirin  81 mg Oral Daily  . cholecalciferol  1,000 Units Oral BID  . clopidogrel  75 mg Oral Q breakfast  . docusate sodium  100 mg Oral BID  . DULoxetine  30 mg Oral q morning - 10a  . enoxaparin (LOVENOX) injection  40 mg Subcutaneous Q24H  . fluticasone furoate-vilanterol  1 puff Inhalation Daily  . gabapentin  600 mg Oral QHS  . insulin aspart  0-15 Units Subcutaneous TID WC  . insulin glargine  20 Units Subcutaneous QHS  . melatonin  6 mg Oral QHS  . pantoprazole  40 mg Oral BID AC  . QUEtiapine  25 mg Oral q AM  . QUEtiapine  50 mg Oral QHS  . simvastatin  40 mg Oral QPM  . umeclidinium bromide  1 puff Inhalation Daily   Continuous Infusions:   LOS: 1 day   Geradine Girt  [facility]   How to contact the [facility] Attending or Consulting provider [place] or covering provider during after hours [place], for this patient?  1. Check the care team in [facility] and look for a) attending/consulting TRH provider listed and b) the [facility] team listed 2. Log  into [email or web address] and use [place]'s universal password to access. If you do not have the password, please contact the hospital operator. 3. Locate the [facility] provider you are looking for under [facility] and page to a number that you can be directly reached. 4. If you still have difficulty reaching the provider, please page the [facility] (Director on Call) for the Hospitalists listed on amion for assistance.  10/13/2020, 1:38 PM

## 2020-10-13 NOTE — Progress Notes (Signed)
Physical Therapy Treatment Patient Details Name: Rick Cruz MRN: [id number] DOB: [birth year] Today's Date: 10/13/2020    History of Present Illness Rick Cruz is a 75 y.o. male with medical history significant of osteoarthritis, history of other nonhemorrhagic stroke, right eye blindness, chronic diastolic heart failure, chronic respiratory failure due to COPD on home oxygen, sleep apnea noncompliant with BiPAP, history of tobacco abuse, type II DM, hyperlipidemia, hypertension, history of unspecified schizophrenia who is coming to the emergency department due to right lower extremity weakness for the past 2 weeks.  He normally uses a walker, but has had multiple falls in the past 2 weeks.  He states that his legs feel like like is giving out.  He denies head trauma or LOC.  He denies numbness or tingling on his left side, blurred vision or slurred speech or difficulty understanding.  Fever, chills, night sweats, rhinorrhea, sore throat, chest pain, palpitations, diaphoresis, PND, orthopnea or recent pitting edema of the lower extremities.  Denies abdominal pain, nausea, vomiting, diarrhea, constipation, melena or hematochezia.  No dysuria, frequency or hematuria.  No polyuria, polydipsia or polyphagia.    PT Comments    Pt limited by fatigue through session with inability to complete standing or gait.  Increased time and labored movements for sitting on EOB.  Verbal cueing to reach for handrails to assist with bed mobility.  Upon sitting pt unable to complete full sentence and unable to keep eyes open.  Min assist to reduce posterior lean and overall sitting balance with BUE assistance on bed.  Due to level of fatigue did not attempt standing or transfer.  Min A to assist with sliding towards HOB.  EOS pt left in bed with call bell within reach and bed alarm set.      Follow Up Recommendations  Home health PT;Supervision for mobility/OOB;Supervision/Assistance - 24 hour     Equipment  Recommendations  None recommended by PT    Recommendations for Other Services       Precautions / Restrictions Precautions Precautions: Fall Restrictions Weight Bearing Restrictions: No    Mobility  Bed Mobility Overal bed mobility: Needs Assistance Bed Mobility: Supine to Sit     Supine to sit: Min guard     General bed mobility comments: increased time, labored movement, cueing for handrail assistance iwth UE.  Pt reports very tired, unable to complete sentence and unable to keep eyes open.  Transfers                    Ambulation/Gait                 Stairs             Wheelchair Mobility    Modified Rankin (Stroke Patients Only)       Balance Overall balance assessment: Needs assistance Sitting-balance support: Feet supported;No upper extremity supported Sitting balance-Leahy Scale: Fair Sitting balance - Comments: fair/good seated EOB, leans backward Postural control: Posterior lean                                  Cognition Arousal/Alertness: Awake/alert Behavior During Therapy: WFL for tasks assessed/performed Overall Cognitive Status: History of cognitive impairments - at baseline  Exercises      General Comments        Pertinent Vitals/Pain Pain Assessment: No/denies pain    Home Living                      Prior Function            PT Goals (current goals can now be found in the care plan section)      Frequency    Min 3X/week      PT Plan      Co-evaluation              AM-PAC PT "6 Clicks" Mobility   Outcome Measure  Help needed turning from your back to your side while in a flat bed without using bedrails?: None Help needed moving from lying on your back to sitting on the side of a flat bed without using bedrails?: None Help needed moving to and from a bed to a chair (including a wheelchair)?: A Little Help  needed standing up from a chair using your arms (e.g., wheelchair or bedside chair)?: A Little Help needed to walk in hospital room?: A Lot Help needed climbing 3-5 steps with a railing? : A Lot 6 Click Score: 18    End of Session Equipment Utilized During Treatment: Oxygen Activity Tolerance: Patient tolerated treatment well;Patient limited by fatigue Patient left: in bed;with call bell/phone within reach;with bed alarm set Nurse Communication: Mobility status PT Visit Diagnosis: Unsteadiness on feet (R26.81);Other abnormalities of gait and mobility (R26.89);Repeated falls (R29.6);Muscle weakness (generalized) (M62.81)     Time: [id number] PT Time Calculation (min) (ACUTE ONLY): 23 min  Charges:  $Therapeutic Exercise: 23-37 mins                    Ihor Austin, LPTA/CLT; CBIS [phone]   Aldona Lento 10/13/2020, 1:49 PM

## 2020-10-13 NOTE — Evaluation (Signed)
Occupational Therapy Evaluation Patient Details Name: Rick Cruz MRN: [id number] DOB: 02-Dec-1945 Today's Date: 10/13/2020    History of Present Illness Rick Cruz is a 75 y.o. male with medical history significant of osteoarthritis, history of other nonhemorrhagic stroke, right eye blindness, chronic diastolic heart failure, chronic respiratory failure due to COPD on home oxygen, sleep apnea noncompliant with BiPAP, history of tobacco abuse, type II DM, hyperlipidemia, hypertension, history of unspecified schizophrenia who is coming to the emergency department due to right lower extremity weakness for the past 2 weeks.  He normally uses a walker, but has had multiple falls in the past 2 weeks.  He states that his legs feel like like is giving out.  He denies head trauma or LOC.  He denies numbness or tingling on his left side, blurred vision or slurred speech or difficulty understanding.  Fever, chills, night sweats, rhinorrhea, sore throat, chest pain, palpitations, diaphoresis, PND, orthopnea or recent pitting edema of the lower extremities.  Denies abdominal pain, nausea, vomiting, diarrhea, constipation, melena or hematochezia.  No dysuria, frequency or hematuria.  No polyuria, polydipsia or polyphagia.   Clinical Impression   Pt agreeable to OT evaluation. Pt able to demonstrate bed mobility with min guard assist and stand pivot or ambulatory transfers with minimal assist to min guard. Pt demonstrates decreased safety awareness as noted by sitting in chair prior to being in a completely safe position with both legs against the chair. Pt in mildly unsteady on his feet. Nursing notified of pt request for insulin at end of session. Will continue to follow in acute care. Discharge recommended to home health for further OT services as needed.     Follow Up Recommendations  Home health OT          Precautions / Restrictions Precautions Precautions: Fall Restrictions Weight Bearing  Restrictions: No      Mobility Bed Mobility Overal bed mobility: Needs Assistance Bed Mobility: Supine to Sit Rolling: Min guard   Supine to sit: Min guard     General bed mobility comments: increased time, labored movement    Transfers Overall transfer level: Needs assistance Equipment used: Rolling walker (2 wheeled) Transfers: Sit to/from [facility] Sit to Stand: Min guard Stand pivot transfers: Min guard;Min assist       General transfer comment: Increased time, decreased safety awareness.                                               ADL either performed or assessed with clinical judgement   ADL Overall ADL's : Needs assistance/impaired Eating/Feeding: Set up Eating/Feeding Details (indicate cue type and reason): Set/up to drink ice water this date. Grooming: Wash/dry face;Min guard;Standing Grooming Details (indicate cue type and reason): Able to stand at sink to wash face. Min guard assist.                 Toilet Transfer: Min guard;Stand-pivot;Ambulation;Minimal assistance Toilet Transfer Details (indicate cue type and reason): Simulated via stand pivot transfer and ambulatory transfer to chair. Noted decreased safety via attempting to walk backwards and sitting before feeling the chair against bilateral LE.           General ADL Comments:  (Min guard at this time. Safety awareness is a concern.)     Vision   Vision Assessment?: No apparent visual deficits  Pertinent Vitals/Pain Pain Assessment: No/denies pain     Hand Dominance Right   Extremity/Trunk Assessment Upper Extremity Assessment Upper Extremity Assessment: Generalized weakness (WFL UE ROM; 4/5 MMT for shoulder flexion)   Lower Extremity Assessment Lower Extremity Assessment: Defer to PT evaluation       Communication Communication Communication: No difficulties   Cognition Arousal/Alertness: Awake/alert Behavior During  Therapy: WFL for tasks assessed/performed Overall Cognitive Status: Within Functional Limits for tasks assessed                                                      Home Living Family/patient expects to be discharged to:: Private residence Living Arrangements: Parent Available Help at Discharge: Family;Available PRN/intermittently;Personal care attendant Type of Home: House Home Access: Ramped entrance;Stairs to enter Entrance Stairs-Number of Steps: 8 Entrance Stairs-Rails: Can reach both (On ramp) Home Layout: One level     Bathroom Shower/Tub: Walk-in Hydrologist: Handicapped height Bathroom Accessibility: Yes   Home Equipment: Environmental consultant - 2 wheels (Pt reported lift chair.)   Additional Comments: lift chair  Lives With: Family    Prior Functioning/Environment Level of Independence: Needs assistance    ADL's / Homemaking Assistance Needed: Independent for BADLs; assistance for IADLs from aid, home aides 4 hours/day x 5 days/week; Reported assist typically for cooking.            OT Problem List: Decreased strength;Impaired balance (sitting and/or standing);Decreased safety awareness      OT Treatment/Interventions: Self-care/ADL training;Therapeutic exercise;Energy conservation;DME and/or AE instruction;Therapeutic activities;Balance training;Patient/family education    OT Goals(Current goals can be found in the care plan section) Acute Rehab OT Goals Patient Stated Goal: Get stronger in left leg. OT Goal Formulation: With patient Time For Goal Achievement: 10/27/20 Potential to Achieve Goals: Good  OT Frequency: Min 2X/week    End of Session Equipment Utilized During Treatment: Gait belt;Rolling walker Nurse Communication: Other (comment) (Pt request for insulin.)  Activity Tolerance: Patient tolerated treatment well Patient left: in chair;with call bell/phone within reach;with chair alarm set  OT Visit Diagnosis:  Unsteadiness on feet (R26.81);Muscle weakness (generalized) (M62.81);History of falling (Z91.81)                Time: [id number] OT Time Calculation (min): 33 min Charges:  OT General Charges $OT Visit: 1 Visit OT Evaluation $OT Eval Low Complexity: 1 Low  Eulalio Reamy OT, MOT   Larey Seat 10/13/2020, 8:15 AM

## 2020-10-13 NOTE — Plan of Care (Signed)
  Problem: Acute Rehab OT Goals (only OT should resolve) Goal: Pt. Will Perform Eating Flowsheets (Taken 10/13/2020 0821) Pt Will Perform Eating:  with modified independence  sitting Goal: Pt. Will Perform Grooming Flowsheets (Taken 10/13/2020 0821) Pt Will Perform Grooming:  with modified independence  standing Goal: Pt. Will Perform Upper Body Dressing Flowsheets (Taken 10/13/2020 0821) Pt Will Perform Upper Body Dressing:  with modified independence  sitting Goal: Pt. Will Perform Lower Body Dressing Flowsheets (Taken 10/13/2020 0821) Pt Will Perform Lower Body Dressing:  with set-up  sitting/lateral leans Goal: Pt. Will Transfer To Toilet [place] (Taken 10/13/2020 [phone]) Pt Will Transfer to Toilet:  stand pivot transfer  with modified independence Goal: Pt/Caregiver Will Perform Home Exercise Program Flowsheets (Taken 10/13/2020 [phone]) Pt/caregiver will Perform Home Exercise Program:  Increased strength  Both right and left upper extremity  With written HEP provided

## 2020-10-13 NOTE — Progress Notes (Signed)
MD notified that patient refused all pm medication.

## 2020-10-14 ENCOUNTER — Inpatient Hospital Stay (HOSPITAL_COMMUNITY): Payer: Medicare Other

## 2020-10-14 LAB — RPR: RPR Ser Ql: NONREACTIVE

## 2020-10-14 LAB — URINALYSIS, ROUTINE W REFLEX MICROSCOPIC
Bilirubin Urine: NEGATIVE
Glucose, UA: NEGATIVE mg/dL
Hgb urine dipstick: NEGATIVE
Ketones, ur: 20 mg/dL — AB
Nitrite: NEGATIVE
Protein, ur: NEGATIVE mg/dL
Specific Gravity, Urine: 1.011 (ref 1.005–1.030)
WBC, UA: >50 WBC/hpf — ABNORMAL HIGH (ref 0–5)
pH: 7 (ref 5.0–8.0)

## 2020-10-14 LAB — GLUCOSE, CAPILLARY
Glucose-Capillary: 144 mg/dL — ABNORMAL HIGH (ref 70–99)
Glucose-Capillary: 138 mg/dL — ABNORMAL HIGH (ref 70–99)
Glucose-Capillary: 137 mg/dL — ABNORMAL HIGH (ref 70–99)
Glucose-Capillary: 163 mg/dL — ABNORMAL HIGH (ref 70–99)

## 2020-10-14 LAB — HOMOCYSTEINE: Homocysteine: 9.2 umol/L (ref 0.0–19.2)

## 2020-10-14 LAB — RAPID URINE DRUG SCREEN, HOSP PERFORMED
Amphetamines: NOT DETECTED
Barbiturates: NOT DETECTED
Benzodiazepines: NOT DETECTED
Cocaine: NOT DETECTED
Opiates: NOT DETECTED
Tetrahydrocannabinol: NOT DETECTED

## 2020-10-14 MED ORDER — SODIUM CHLORIDE 0.9 % IV SOLN
1.0000 g | INTRAVENOUS | Status: DC
  Administered 2020-10-14 – 2020-10-15 (×2): 1 g via INTRAVENOUS
  Filled 2020-10-14 (×2): qty 10

## 2020-10-14 MED ORDER — APIXABAN 5 MG PO TABS
5.0000 mg | ORAL_TABLET | Freq: Two times a day (BID) | ORAL | Status: DC
  Administered 2020-10-14 – 2020-10-18 (×5): 5 mg via ORAL
  Filled 2020-10-14 (×9): qty 1

## 2020-10-14 MED ORDER — METOPROLOL TARTRATE 25 MG PO TABS
25.0000 mg | ORAL_TABLET | Freq: Two times a day (BID) | ORAL | Status: DC
  Administered 2020-10-14: 25 mg via ORAL
  Filled 2020-10-14 (×5): qty 1

## 2020-10-14 NOTE — TOC Progression Note (Signed)
Transition of Care [facility]) - Progression Note   Patient Details  Name: Rick Cruz MRN: [id number] Date of Birth: April 23, 1946  Transition of Care [facility] CM/SW [place], LCSW Phone Number: 10/14/2020, 3:29 PM  Clinical Narrative: PT is now recommending SNF. CSW updated Joelene Millin with [facility]. CSW met with patient to discuss SNF. Patient mildly confused, but agreeable. CSW also spoke with patient's mother and wife, from whom he is separated. Both are agreeable to SNF. CSW completed FL2 and verified PASRR. CSW started insurance authorization with [employer]. Reference ID # [id number]. Clinicals faxed to [facility]. TOC awaiting bed offers.  Expected Discharge Plan: [place] Barriers to Discharge: Continued Medical Work up  Expected Discharge Plan and Services Expected Discharge Plan: [place] In-house Referral: Clinical Social Work Discharge Planning Services: NA Post Acute Care Choice: [place] arrangements for the past 2 months: Single Family Home              DME Arranged: N/A DME Agency: NA HH Arranged: PT HH Agency: [facility] Date [place]: 10/12/20 Time [place]: [place] Representative spoke with at [place]: Joelene Millin  Readmission Risk Interventions No flowsheet data found.

## 2020-10-14 NOTE — Progress Notes (Signed)
Patient refused all medications by mouth.  Attempted to give whole and crushed in applesauce refused both ways.

## 2020-10-14 NOTE — NC FL2 (Signed)
[place] LEVEL OF CARE SCREENING TOOL     IDENTIFICATION  Patient Name: Rick Cruz Birthdate: Jan 14, 1946 Sex: male Admission Date (Current Location): 10/11/2020  [facility] and [place] Number:  [employer] and Address:  [address], [place]      Provider Number: [id number]  Attending Physician Name and Address:  Geradine Girt, DO  Relative Name and Phone Number:  Kary Frontino (spouse) Ph: [phone]    Current Level of Care: Hospital Recommended Level of Care: [place] Prior Approval Number:    Date Approved/Denied:   PASRR Number: [id number]  Discharge Plan: SNF    Current Diagnoses: Patient Active Problem List   Diagnosis Date Noted  . CVA (cerebral vascular accident) ([place]) 10/12/2020  . Ischemic cerebrovascular accident (CVA) ([place]) 10/11/2020  . Hypoalbuminemia 10/11/2020  . Class 1 obesity 10/11/2020  . Iron deficiency anemia 10/11/2020  . Acute metabolic encephalopathy [id number]  . DKA, type 2, not at goal [facility]) 11/03/2019  . Heart failure ([place]) 02/02/2019  . Hyperglycemia   . Altered mental status 11/21/2018  . Diabetic foot ([place]) 07/23/2018  . Accidental fall 06/25/2018  . Supplemental oxygen dependent 06/25/2018  . 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 Neck pain 01/09/2018  . 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 Diabetic neuropathy ([place]) 12/13/2017  . Gastroesophageal reflux disease 12/13/2017  . Insomnia 07/30/2017  . Depression 07/19/2016  . OSA (obstructive sleep apnea)   . Chronic respiratory failure with hypercapnia ([place])   . Undifferentiated schizophrenia ([place])   . Uncontrolled type 2 diabetes mellitus with complication ([place])   . Anemia of chronic disease   . Paroxysmal atrial fibrillation (HCC)   . Hyponatremia 06/12/2016  . Aspiration pneumonia ([place]) 08/16/2015  . Seborrheic keratosis 08/04/2015  . Pure hypercholesterolemia 07/05/2015  . Obesity  hypoventilation syndrome ([place]) 04/05/2015  . CSA (central sleep apnea) 04/05/2015  . Non compliance with medical treatment 04/05/2015  . Type 2 diabetes mellitus with hyperglycemia ([place]) 02/28/2015  . Acute respiratory failure with hypoxia and hypercarbia (HCC)   . Chronic diastolic CHF (congestive heart failure) ([place]) 08/06/2014  . Recurrent falls 05/08/2014  . Ankle fracture, right 05/08/2014  . Ankle fracture, left 05/08/2014  . C. difficile colitis 01/04/2014  . Delirium 01/02/2014  . Acute encephalopathy 01/02/2014  . Morbid obesity ([place]) 06/17/2013  . Erosive esophagitis 04/22/2013  . Chronic respiratory failure 02 dep with hypercarbia 04/22/2013  . Type 2 diabetes mellitus without complication, with long-term current use of insulin ([place]) 04/21/2013  . Paranoid schizophrenia ([place]) 04/18/2013  . CAD (coronary artery disease) 04/18/2013  . DYSLIPIDEMIA 08/25/2008  . Obstructive sleep apnea 08/25/2008  . Essential hypertension 08/25/2008  . Dyslipidemia 08/25/2008    Orientation RESPIRATION BLADDER Height & Weight     Self,Time,Place  O2 (2L/min) Continent Weight: 237 lb (107.5 kg) Height:  6' (182.9 cm)  BEHAVIORAL SYMPTOMS/MOOD NEUROLOGICAL BOWEL NUTRITION STATUS      Continent Diet (Heart healthy/carb modified)  AMBULATORY STATUS COMMUNICATION OF NEEDS Skin   Extensive Assist Verbally Other (Comment) (Rash on back)                       Personal Care Assistance Level of Assistance  Bathing,Feeding,Dressing Bathing Assistance: Maximum assistance Feeding assistance: Independent Dressing Assistance: Maximum assistance     Functional Limitations Info  Sight,Hearing,Speech Sight Info: Impaired Hearing Info: Adequate Speech Info: Adequate    SPECIAL CARE FACTORS FREQUENCY  PT (  By licensed PT)     PT Frequency: 5x's/week              Contractures Contractures Info: Not present    Additional Factors Info  Code Status,Allergies,Insulin Sliding  Scale,Psychotropic Code Status Info: Full Allergies Info: Phenergan (Promethazine Hcl); Haldol (Haloperidol Lactate); Metformin And Related Psychotropic Info: Cymbalta; Neurontin (gabapentin); Seroquel (quetiapine) Insulin Sliding Scale Info: See discharge summary       Current Medications (10/14/2020):  This is the current hospital active medication list Current Facility-Administered Medications  Medication Dose Route Frequency Provider Last Rate Last Admin  . acetaminophen (TYLENOL) tablet 650 mg  650 mg Oral Q6H PRN Reubin Milan, MD   650 mg at 10/14/20 1003   Or  . acetaminophen (TYLENOL) suppository 650 mg  650 mg Rectal Q6H PRN Reubin Milan, MD      . albuterol (PROVENTIL) (2.5 MG/3ML) 0.083% nebulizer solution 2.5 mg  2.5 mg Nebulization Q6H PRN Reubin Milan, MD      . apixaban Arne [place]) tablet 5 mg  5 mg Oral BID Eulogio Bear U, DO   5 mg at 10/14/20 1004  . aspirin chewable tablet 81 mg  81 mg Oral Daily Eulogio Bear U, DO   81 mg at 10/14/20 1004  . cefTRIAXone (ROCEPHIN) 1 g in sodium chloride 0.9 % 100 mL IVPB  1 g Intravenous Q24H Vann, Jessica U, DO      . cholecalciferol (VITAMIN D3) tablet 1,000 Units  1,000 Units Oral BID Reubin Milan, MD   1,000 Units at 10/14/20 1003  . docusate sodium (COLACE) capsule 100 mg  100 mg Oral BID Reubin Milan, MD   100 mg at 10/14/20 1003  . DULoxetine (CYMBALTA) DR capsule 30 mg  30 mg Oral q morning - 10a Reubin Milan, MD   30 mg at 10/14/20 1003  . fluticasone furoate-vilanterol (BREO ELLIPTA) 100-25 MCG/INH 1 puff  1 puff Inhalation Daily Pham, Lonna Duval Q, RPH-CPP   1 puff at 10/13/20 0811  . gabapentin (NEURONTIN) capsule 600 mg  600 mg Oral QHS Reubin Milan, MD   600 mg at 10/12/20 2216  . hydrOXYzine (ATARAX/VISTARIL) tablet 25 mg  25 mg Oral Q12H PRN Reubin Milan, MD      . insulin aspart (novoLOG) injection 0-15 Units  0-15 Units Subcutaneous TID WC Reubin Milan, MD   2 Units  at 10/14/20 1303  . insulin glargine (LANTUS) injection 20 Units  20 Units Subcutaneous QHS Reubin Milan, MD   20 Units at 10/12/20 2216  . melatonin tablet 6 mg  6 mg Oral QHS Reubin Milan, MD   6 mg at 10/12/20 2211  . metoprolol tartrate (LOPRESSOR) tablet 25 mg  25 mg Oral BID Eulogio Bear U, DO   25 mg at 10/14/20 1003  . ondansetron (ZOFRAN) tablet 4 mg  4 mg Oral Q6H PRN Reubin Milan, MD       Or  . ondansetron [facility]) injection 4 mg  4 mg Intravenous Q6H PRN Reubin Milan, MD      . pantoprazole (PROTONIX) EC tablet 40 mg  40 mg Oral BID AC Reubin Milan, MD   40 mg at 10/14/20 1003  . polyvinyl alcohol (LIQUIFILM TEARS) 1.4 % ophthalmic solution 1 drop  1 drop Both Eyes TID PRN Pham, Minh Q, RPH-CPP      . QUEtiapine (SEROQUEL) tablet 25 mg  25 mg Oral q AM Reubin Milan,  MD   25 mg at 10/13/20 0604  . QUEtiapine (SEROQUEL) tablet 50 mg  50 mg Oral QHS Reubin Milan, MD   50 mg at 10/12/20 2211  . simvastatin (ZOCOR) tablet 40 mg  40 mg Oral QPM Reubin Milan, MD   40 mg at 10/13/20 1753  . traMADol (ULTRAM) tablet 100 mg  100 mg Oral BID PRN Reubin Milan, MD   100 mg at 10/13/20 1753  . triamcinolone (KENALOG) 0.1 % cream 1 application  1 application Topical BID PRN Reubin Milan, MD      . umeclidinium bromide (INCRUSE ELLIPTA) 62.5 MCG/INH 1 puff  1 puff Inhalation Daily Onnie Boer Q, RPH-CPP   1 puff at 10/13/20 [id number]     Discharge Medications: Please see discharge summary for a list of discharge medications.  Relevant Imaging Results:  Relevant Lab Results:   Additional Information SSN: [id number]  Sherie Don, LCSW

## 2020-10-14 NOTE — Progress Notes (Signed)
Physical Therapy Treatment Patient Details Name: Rick Cruz MRN: [id number] DOB: 24-May-1946 Today's Date: 10/14/2020    History of Present Illness Rick Cruz is a 75 y.o. male with medical history significant of osteoarthritis, history of other nonhemorrhagic stroke, right eye blindness, chronic diastolic heart failure, chronic respiratory failure due to COPD on home oxygen, sleep apnea noncompliant with BiPAP, history of tobacco abuse, type II DM, hyperlipidemia, hypertension, history of unspecified schizophrenia who is coming to the emergency department due to right lower extremity weakness for the past 2 weeks.  He normally uses a walker, but has had multiple falls in the past 2 weeks.  He states that his legs feel like like is giving out.  He denies head trauma or LOC.  He denies numbness or tingling on his left side, blurred vision or slurred speech or difficulty understanding.  Fever, chills, night sweats, rhinorrhea, sore throat, chest pain, palpitations, diaphoresis, PND, orthopnea or recent pitting edema of the lower extremities.  Denies abdominal pain, nausea, vomiting, diarrhea, constipation, melena or hematochezia.  No dysuria, frequency or hematuria.  No polyuria, polydipsia or polyphagia.    PT Comments    REASSESSMENT: Patient presents confused requiring Max verbal/tactile cueing to participate, c/o severe pain with pressure to LLE and limited to sitting up at bedside and unable to attempt sit to stands or transferring to chair due to weakness and possible confusion.  Patient was not wearing O2 with SpO2 at 92%, patient put back on 2 LPM with SpO2 increasing to 95% - RN notified.  Patient will benefit from continued physical therapy in hospital and recommended venue below to increase strength, balance, endurance for safe ADLs and gait.   Follow Up Recommendations  SNF     Equipment Recommendations  None recommended by PT    Recommendations for Other Services        Precautions / Restrictions Precautions Precautions: Fall Restrictions Weight Bearing Restrictions: No    Mobility  Bed Mobility Overal bed mobility: Needs Assistance Bed Mobility: Supine to Sit;Sit to Supine     Supine to sit: Max assist Sit to supine: Max assist      Transfers                    Ambulation/Gait                 Stairs             Wheelchair Mobility    Modified Rankin (Stroke Patients Only)       Balance Overall balance assessment: Needs assistance Sitting-balance support: Feet supported;No upper extremity supported Sitting balance-Leahy Scale: Poor Sitting balance - Comments: seated at EOB                                    Cognition Arousal/Alertness: Awake/alert Behavior During Therapy: Agitated;Flat affect Overall Cognitive Status: History of cognitive impairments - at baseline                                        Exercises      General Comments        Pertinent Vitals/Pain Pain Assessment: Faces Faces Pain Scale: Hurts even more Pain Location: LLE with movement/pressure Pain Descriptors / Indicators: Grimacing;Guarding Pain Intervention(s): Limited activity within patient's tolerance;Monitored during session;Repositioned  Home Living                      Prior Function            PT Goals (current goals can now be found in the care plan section) Acute Rehab PT Goals PT Goal Formulation: With patient Time For Goal Achievement: 10/28/20 Potential to Achieve Goals: Good Progress towards PT goals: Not progressing toward goals - comment (Patient presents slightly confused with poor participation with therapy)    Frequency    Min 3X/week      PT Plan Discharge plan needs to be updated    Co-evaluation              AM-PAC PT "6 Clicks" Mobility   Outcome Measure  Help needed turning from your back to your side while in a flat bed without using  bedrails?: A Lot Help needed moving from lying on your back to sitting on the side of a flat bed without using bedrails?: A Lot Help needed moving to and from a bed to a chair (including a wheelchair)?: Total Help needed standing up from a chair using your arms (e.g., wheelchair or bedside chair)?: Total Help needed to walk in hospital room?: Total Help needed climbing 3-5 steps with a railing? : Total 6 Click Score: 8    End of Session Equipment Utilized During Treatment: Oxygen Activity Tolerance: Patient limited by fatigue;Treatment limited secondary to agitation Patient left: in bed;with call bell/phone within reach;with bed alarm set Nurse Communication: Mobility status PT Visit Diagnosis: Unsteadiness on feet (R26.81);Other abnormalities of gait and mobility (R26.89);Repeated falls (R29.6);Muscle weakness (generalized) (M62.81)     Time: [id number] PT Time Calculation (min) (ACUTE ONLY): 15 min  Charges:  $Therapeutic Activity: 8-22 mins                     11:20 AM, 10/14/20 Lonell Grandchild, MPT Physical Therapist with [facility] [phone] office [phone] mobile phone

## 2020-10-14 NOTE — Progress Notes (Signed)
Progress Note    Rick Cruz  [id number] DOB: 1946/03/04  DOA: 10/11/2020 PCP: Vassie Moment, NP (Inactive)    Brief Narrative:    Medical records reviewed and are as summarized below:  Rick Cruz is an 75 y.o. male with medical history significant of osteoarthritis, history of other nonhemorrhagic stroke, right eye blindness, chronic diastolic heart failure, chronic respiratory failure due to COPD on home oxygen, sleep apnea noncompliant with BiPAP, history of tobacco abuse, type II DM, hyperlipidemia, hypertension, history of unspecified schizophrenia who is coming to the emergency department due to right lower extremity weakness for the past 2 weeks.    Assessment/Plan:   Principal Problem:   Ischemic cerebrovascular accident (CVA) ([place]) Active Problems:   Obstructive sleep apnea   Essential hypertension   Paranoid schizophrenia ([place])   Type 2 diabetes mellitus without complication, with long-term current use of insulin (HCC)   Chronic respiratory failure 02 dep with hypercarbia   Morbid obesity (HCC)   Chronic diastolic CHF (congestive heart failure) (HCC)   Paroxysmal atrial fibrillation (HCC)   Depression   Diabetic neuropathy (HCC)   Dyslipidemia   Gastroesophageal reflux disease   Class 1 obesity   CVA (cerebral vascular accident) ([place])   Fever causing AMS -due to UTI? 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 -passed Swallow screen in ER. -PT/OT/SLP.- home health -LDL: 88 (on statin) -hemoglobin A1c: 6.7 (goal <7) -carotid Doppler: right: Peak velocity suggests 50%-69% stenosis, with the ICA/ CCA ratio suggesting a lesser degree of stenosis. If establishing a more accurate degree of stenosis is required, cerebral angiogram should be considered, or as a second best test, CTA. --echocardiogram: Left ventricular  ejection fraction, by estimation, is 55 to 60%. The  left ventricle has normal function. The left ventricle has no regional  wall motion abnormalities. There is moderate left ventricular hypertrophy.  Left ventricular diastolic  parameters are indeterminate. -Discussed with neurology- additional ASA 81 mg x 7 days on top of eliquis    Essential hypertension -resume BB at a lower dose    Type 2 diabetes mellitus without complication, with long-term current use of insulin (HCC) Carbohydrate modified diet. Continue Lantus 20 units at bedtime. -SSI -hemoglobin A1c <7    Chronic respiratory failure  with hypercarbia Continue supplemental oxygen- goal sat of 88-92% Continue Trelegy Ellipta daily. Continuous Spiriva daily. Rescue MDI or nebs as needed.  Hyponatremia -monitor -has been low in the past as well- responded to IVF    Chronic diastolic CHF (congestive heart failure) (HCC) No signs of decompensation. Holding beta-blocker to allow for permissive HTN    Paroxysmal atrial fibrillation (HCC) [place] VASc Score of at least 5. Off apixaban due to CVA (per ER: Neurology was contacted who recommended to stop apixaban and begin aspirin) Off metoprolol to allow permissive hypertension. Continue cardiac telemetry.    Paranoid schizophrenia (HCC) Continue Seroquel 25 mg in the morning. Continue Seroquel 50 mg at bedtime. On duloxetine for depression.    Depression Continue duloxetine 30 mg p.o. daily.    Diabetic neuropathy (HCC) Continue gabapentin 600 mg p.o. bedtime.    Dyslipidemia On simvastatin- not at goal -LDL: 88 -goal <70    Obstructive sleep apnea -does not wear CPAP  obesity Body mass index is 32.14 kg/m.   Family Communication/Anticipated D/C date and plan/Code Status   DVT prophylaxis: eliquis Code Status:  Full Code.  Disposition Plan: Status is: inpt Spoke with wife  The patient will require care spanning > 2 midnights and should be  moved to inpatient because: Inpatient level of care appropriate due to severity of illness  Dispo: The patient is from: Home              Anticipated d/c is to: tbd              Anticipated d/c date is: 2 days              Patient currently is not medically stable to d/c.- await resolution of AMS   Difficult to place patient No         Medical Consultants:    Neurology  Subjective:   Confused this AM  Objective:    Vitals:   10/14/20 0553 10/14/20 0942 10/14/20 0951 10/14/20 1306  BP: (!) 158/100  (!) 138/96 (!) 105/32  Pulse: (!) 109 (!) 115 (!) 115 67  Resp: '18  20 18  '$ Temp: 98 F (36.7 C)  (!) 101.2 F (38.4 C) 99.9 F (37.7 C)  TempSrc: Oral     SpO2: 91% 97% 96% 98%  Weight:      Height:        Intake/Output Summary (Last 24 hours) at 10/14/2020 1322 Last data filed at 10/14/2020 [id number] Gross per 24 hour  Intake 120 ml  Output 1300 ml  Net -1180 ml   Filed Weights   10/11/20 1155  Weight: 107.5 kg    Exam:  General: Appearance:    Obese male who appears sick     Lungs:     respirations unlabored  Heart:    Normal heart rate. irr    MS:   All extremities are intact.   Neurologic:   Awake but confused and uncooperative      Data Reviewed:   I have personally reviewed following labs and imaging studies:  Labs: Labs show the following:   Basic Metabolic Panel: Recent Labs  Lab 10/11/20 1545 10/12/20 0458 10/13/20 0648  NA 137 136 130*  K 4.8 4.2 4.3  CL 95* 94* 92*  CO2 33* 32 31  GLUCOSE 120* 108* 122*  BUN '14 14 9  '$ CREATININE 1.11 0.98 0.85  CALCIUM 9.1 9.4 9.1   GFR Estimated Creatinine Clearance: 98.1 mL/min (by C-G formula based on SCr of 0.85 mg/dL). Liver Function Tests: Recent Labs  Lab 10/11/20 1545  AST 22  ALT 19  ALKPHOS 82  BILITOT 0.4  PROT 6.7  ALBUMIN 3.4*   No results for input(s): LIPASE, AMYLASE in the last 168 hours. No results for input(s): AMMONIA in the last 168 hours. Coagulation profile Recent  Labs  Lab 10/11/20 2028  INR 1.3*    CBC: Recent Labs  Lab 10/11/20 1545 10/12/20 0458 10/13/20 0648  WBC 11.5* 10.4 10.6*  NEUTROABS 8.2*  --   --   HGB 11.1* 12.1* 10.8*  HCT 38.8* 41.8 35.9*  MCV 84.0 85.0 80.9  PLT 249 249 234   Cardiac Enzymes: No results for input(s): CKTOTAL, CKMB, CKMBINDEX, TROPONINI in the last 168 hours. BNP (last 3 results) No results for input(s): PROBNP in the last 8760 hours. CBG: Recent Labs  Lab 10/12/20 2155 10/13/20 0802 10/13/20 1638 10/13/20 2003 10/14/20 0736  GLUCAP 129* 145* 115* 101* 144*   D-Dimer: No results for input(s): DDIMER in the last 72 hours. Hgb A1c: Recent Labs    10/12/20 0458  HGBA1C 6.7*  Lipid Profile: Recent Labs    10/12/20 0458  CHOL 165  HDL 39*  LDLCALC 88  TRIG 190*  CHOLHDL 4.2   Thyroid function studies: Recent Labs    10/13/20 1846  TSH 1.977   Anemia work up: No results for input(s): VITAMINB12, FOLATE, FERRITIN, TIBC, IRON, RETICCTPCT in the last 72 hours. Sepsis Labs: Recent Labs  Lab 10/11/20 1545 10/12/20 0458 10/13/20 0648  WBC 11.5* 10.4 10.6*    Microbiology Recent Results (from the past 240 hour(s))  SARS CORONAVIRUS 2 (TAT 6-24 HRS) Nasopharyngeal Nasopharyngeal Swab     Status: None   Collection Time: 10/11/20  6:05 PM   Specimen: Nasopharyngeal Swab  Result Value Ref Range Status   SARS Coronavirus 2 NEGATIVE NEGATIVE Final    Comment: (NOTE) SARS-CoV-2 target nucleic acids are NOT DETECTED.  The SARS-CoV-2 RNA is generally detectable in upper and lower respiratory specimens during the acute phase of infection. Negative results do not preclude SARS-CoV-2 infection, do not rule out co-infections with other pathogens, and should not be used as the sole basis for treatment or other patient management decisions. Negative results must be combined with clinical observations, patient history, and epidemiological information. The expected result is  Negative.  Fact Sheet for Patients: SugarRoll.be  Fact Sheet for Healthcare Providers: https://www.woods-mathews.com/  This test is not yet approved or cleared by the [place] FDA and  has been authorized for detection and/or diagnosis of SARS-CoV-2 by FDA under an Emergency Use Authorization (EUA). This EUA will remain  in effect (meaning this test can be used) for the duration of the COVID-19 declaration under Se ction 564(b)(1) of the Act, 21 U.S.C. section 360bbb-3(b)(1), unless the authorization is terminated or revoked sooner.  Performed at [facility], [place] [address] [place], [place] [address]   Culture, blood (Routine X 2) w Reflex to ID Panel     Status: None (Preliminary result)   Collection Time: 10/14/20 10:14 AM   Specimen: BLOOD  Result Value Ref Range Status   Specimen Description BLOOD LEFT ANTECUBITAL  Final   Special Requests   Final    BOTTLES DRAWN AEROBIC AND ANAEROBIC Blood Culture adequate volume Performed at [facility], [address] [place], [place] [address]    Culture PENDING  Incomplete   Report Status PENDING  Incomplete    Procedures and diagnostic studies:  DG CHEST PORT 1 VIEW  Result Date: 10/14/2020 CLINICAL DATA:  Fever, weakness, negative COVID-19 test on 10/11/2020; history COPD, CHF, diabetes mellitus, hypertension, former smoker EXAM: PORTABLE CHEST 1 VIEW COMPARISON:  Portable exam 1039 hours compared to 02/20/2020 FINDINGS: Enlargement of cardiac silhouette. Mediastinal contours and pulmonary vascularity normal. Atherosclerotic calcification aorta. Lungs clear. No pulmonary infiltrate, pleural effusion or pneumothorax. Diffuse osseous demineralization. IMPRESSION: Enlargement of cardiac silhouette. No acute abnormalities. Aortic Atherosclerosis (ICD10-I70.0). Electronically Signed   By: Lavonia Dana M.D.   On: 10/14/2020 10:58    Medications:   . apixaban  5 mg Oral BID  . aspirin   81 mg Oral Daily  . cholecalciferol  1,000 Units Oral BID  . docusate sodium  100 mg Oral BID  . DULoxetine  30 mg Oral q morning - 10a  . fluticasone furoate-vilanterol  1 puff Inhalation Daily  . gabapentin  600 mg Oral QHS  . insulin aspart  0-15 Units Subcutaneous TID WC  . insulin glargine  20 Units Subcutaneous QHS  . melatonin  6 mg Oral QHS  . metoprolol tartrate  25  mg Oral BID  . pantoprazole  40 mg Oral BID AC  . QUEtiapine  25 mg Oral q AM  . QUEtiapine  50 mg Oral QHS  . simvastatin  40 mg Oral QPM  . umeclidinium bromide  1 puff Inhalation Daily   Continuous Infusions: . cefTRIAXone (ROCEPHIN)  IV       LOS: 2 days   Geradine Girt  [facility]   How to contact the [facility] Attending or Consulting provider [place] or covering provider during after hours [place], for this patient?  1. Check the care team in [facility] and look for a) attending/consulting TRH provider listed and b) the [facility] team listed 2. Log into [email or web address] and use [place]'s universal password to access. If you do not have the password, please contact the hospital operator. 3. Locate the [facility] provider you are looking for under [facility] and page to a number that you can be directly reached. 4. If you still have difficulty reaching the provider, please page the [facility] (Director on Call) for the Hospitalists listed on amion for assistance.  10/14/2020, 1:22 PM

## 2020-10-14 NOTE — Progress Notes (Signed)
   10/14/20 0951  Assess: MEWS Score  Temp (!) 101.2 F (38.4 C)  BP (!) 138/96  Pulse Rate (!) 115  Resp 20  SpO2 96 %  O2 Device Nasal Cannula  O2 Flow Rate (L/min) 2 L/min  Assess: MEWS Score  MEWS Temp 1  MEWS Systolic 0  MEWS Pulse 2  MEWS RR 0  MEWS LOC 0  MEWS Score 3  MEWS Score Color Yellow  Assess: if the MEWS score is Yellow or Red  Were vital signs taken at a resting state? Yes  Focused Assessment Change from prior assessment (see assessment flowsheet)  Early Detection of Sepsis Score *See Row Information* Medium  MEWS guidelines implemented *See Row Information* Yes  Treat  MEWS Interventions Administered prn meds/treatments  Take Vital Signs  Increase Vital Sign Frequency  Yellow: Q 2hr X 2 then Q 4hr X 2, if remains yellow, continue Q 4hrs  Escalate  MEWS: Escalate Yellow: discuss with charge nurse/RN and consider discussing with provider and RRT  Notify: Charge Nurse/RN  Name of Charge Nurse/RN Notified L Bullins, RN  Date Charge Nurse/RN Notified 10/14/20  Time Charge Nurse/RN Notified [phone]  Document  Patient Outcome Other (Comment) (Same)  Progress note created (see row info) Yes

## 2020-10-14 NOTE — Care Management Important Message (Signed)
Important Message  Patient Details  Name: Rick Cruz MRN: [id number] Date of Birth: 1945/11/21   Medicare Important Message Given:  Yes     Tommy Medal 10/14/2020, 3:39 PM

## 2020-10-15 LAB — BASIC METABOLIC PANEL
Anion gap: 11 (ref 5–15)
BUN: 15 mg/dL (ref 8–23)
CO2: 25 mmol/L (ref 22–32)
Calcium: 9.4 mg/dL (ref 8.9–10.3)
Chloride: 93 mmol/L — ABNORMAL LOW (ref 98–111)
Creatinine, Ser: 1.12 mg/dL (ref 0.61–1.24)
GFR, Estimated: >60 mL/min (ref >60)
Glucose, Bld: 175 mg/dL — ABNORMAL HIGH (ref 70–99)
Potassium: 4.6 mmol/L (ref 3.5–5.1)
Sodium: 129 mmol/L — ABNORMAL LOW (ref 135–145)

## 2020-10-15 LAB — CBC
HCT: 39 % (ref 39.0–52.0)
Hemoglobin: 12.1 g/dL — ABNORMAL LOW (ref 13.0–17.0)
MCH: 24.5 pg — ABNORMAL LOW (ref 26.0–34.0)
MCHC: 31 g/dL (ref 30.0–36.0)
MCV: 79.1 fL — ABNORMAL LOW (ref 80.0–100.0)
Platelets: 282 10*3/uL (ref 150–400)
RBC: 4.93 MIL/uL (ref 4.22–5.81)
RDW: 15.4 % (ref 11.5–15.5)
WBC: 13.6 10*3/uL — ABNORMAL HIGH (ref 4.0–10.5)
nRBC: 0 % (ref 0.0–0.2)

## 2020-10-15 LAB — GLUCOSE, CAPILLARY
Glucose-Capillary: 188 mg/dL — ABNORMAL HIGH (ref 70–99)
Glucose-Capillary: 170 mg/dL — ABNORMAL HIGH (ref 70–99)
Glucose-Capillary: 166 mg/dL — ABNORMAL HIGH (ref 70–99)

## 2020-10-15 MED ORDER — SODIUM CHLORIDE 0.9 % IV SOLN: INTRAVENOUS | Status: DC

## 2020-10-15 MED ORDER — OLANZAPINE 5 MG PO TBDP
5.0000 mg | ORAL_TABLET | Freq: Two times a day (BID) | ORAL | Status: DC
  Administered 2020-10-15 – 2020-10-17 (×4): 5 mg via ORAL
  Filled 2020-10-15 (×9): qty 1

## 2020-10-15 NOTE — Progress Notes (Signed)
   10/15/20 2156  Notify: Provider  Response No new orders (HR okay to monitor for now per provider. stated to notify him if HR was 130s or higher sustaining and he would consider adding lose dose IV metoprolol at that point, he is aware patient has been refusing oral meds including metoprolol and eliquis,said ok)  Date of Provider Response 10/15/20  Time of Provider Response 2156  Document  Patient Outcome Other (Comment) (HR in 110s now, provider states okay. nursing to monitor.)

## 2020-10-15 NOTE — Progress Notes (Signed)
Progress Note    Rick Cruz  [id number] DOB: 21-Nov-1945  DOA: 10/11/2020 PCP: Vassie Moment, NP (Inactive)    Brief Narrative:    Medical records reviewed and are as summarized below:  Rick Cruz is an 75 y.o. male with medical history significant of osteoarthritis, history of other nonhemorrhagic stroke, right eye blindness, chronic diastolic heart failure, chronic respiratory failure due to COPD on home oxygen, sleep apnea noncompliant with BiPAP, history of tobacco abuse, type II DM, hyperlipidemia, hypertension, history of unspecified schizophrenia who is coming to the emergency department due to right lower extremity weakness for the past 2 weeks.    Assessment/Plan:   Principal Problem:   Ischemic cerebrovascular accident (CVA) ([place]) Active Problems:   Obstructive sleep apnea   Essential hypertension   Paranoid schizophrenia ([place])   Type 2 diabetes mellitus without complication, with long-term current use of insulin (HCC)   Chronic respiratory failure 02 dep with hypercarbia   Morbid obesity (HCC)   Chronic diastolic CHF (congestive heart failure) (HCC)   Paroxysmal atrial fibrillation (HCC)   Depression   Diabetic neuropathy (HCC)   Dyslipidemia   Gastroesophageal reflux disease   Class 1 obesity   CVA (cerebral vascular accident) ([place])   Fever causing AMS -due to UTI? 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 -passed Swallow screen in ER. -PT/OT/SLP.- home health -LDL: 88 (on statin) -hemoglobin A1c: 6.7 (goal <7) -carotid Doppler: right: Peak velocity suggests 50%-69% stenosis, with the ICA/ CCA ratio suggesting a lesser degree of stenosis. If establishing a more accurate degree of stenosis is required, cerebral angiogram should be considered, or as a second best test, CTA. --echocardiogram: Left ventricular  ejection fraction, by estimation, is 55 to 60%. The  left ventricle has normal function. The left ventricle has no regional  wall motion abnormalities. There is moderate left ventricular hypertrophy.  Left ventricular diastolic  parameters are indeterminate. -Discussed with neurology- additional ASA 81 mg x 7 days on top of eliquis    Essential hypertension -resume BB at a lower dose    Type 2 diabetes mellitus without complication, with long-term current use of insulin (HCC) Carbohydrate modified diet. Continue Lantus 20 units at bedtime. -SSI -hemoglobin A1c <7    Chronic respiratory failure  with hypercarbia Continue supplemental oxygen- goal sat of 88-92% Continue Trelegy Ellipta daily. Continuous Spiriva daily. Rescue MDI or nebs as needed.  Hyponatremia -monitor -has been low in the past as well- responded to IVF    Chronic diastolic CHF (congestive heart failure) (HCC) No signs of decompensation. Holding beta-blocker to allow for permissive HTN    Paroxysmal atrial fibrillation (HCC) [place] VASc Score of at least 5. Off apixaban due to CVA (per ER: Neurology was contacted who recommended to stop apixaban and begin aspirin) Off metoprolol to allow permissive hypertension. Continue cardiac telemetry.    Paranoid schizophrenia ([place]) -change seroquel to zyprexa while refusing meds    Depression Continue duloxetine 30 mg p.o. daily.    Diabetic neuropathy (HCC) Continue gabapentin 600 mg p.o. bedtime.    Dyslipidemia On simvastatin- not at goal -LDL: 88 -goal <70    Obstructive sleep apnea -does not wear CPAP  obesity Body mass index is 32.14 kg/m.   Family Communication/Anticipated D/C date and plan/Code Status   DVT prophylaxis: eliquis Code Status: Full Code.  Disposition Plan: Status is: inpt Spoke with  wife  The patient will require care spanning > 2 midnights and should be moved to inpatient because: Inpatient level of care appropriate  due to severity of illness  Dispo: The patient is from: Home              Anticipated d/c is to: tbd              Anticipated d/c date is: 2 days              Patient currently is not medically stable to d/c.- await resolution of AMS   Difficult to place patient No         Medical Consultants:    Neurology  Subjective:   Still refusing meds/eating  Objective:    Vitals:   10/14/20 2127 10/15/20 0714 10/15/20 0757 10/15/20 1252  BP: 124/75 (!) 145/135  (!) 157/92  Pulse: (!) 115 (!) 104  92  Resp: '18 16  20  '$ Temp: 100.2 F (37.9 C) 98.3 F (36.8 C)  99.5 F (37.5 C)  TempSrc: Oral Oral  Oral  SpO2: 95% 98% 90% 98%  Weight:      Height:        Intake/Output Summary (Last 24 hours) at 10/15/2020 1402 Last data filed at 10/15/2020 1100 Gross per 24 hour  Intake 260.36 ml  Output 450 ml  Net -189.64 ml   Filed Weights   10/11/20 1155  Weight: 107.5 kg    Exam:  General: Appearance:    Obese male who appears chronically ill     Lungs:     respirations unlabored  Heart:  normal  MS:   All extremities are intact.   Neurologic:   Will awaken but not cooperative    Data Reviewed:   I have personally reviewed following labs and imaging studies:  Labs: Labs show the following:   Basic Metabolic Panel: Recent Labs  Lab 10/11/20 1545 10/12/20 0458 10/13/20 0648 10/15/20 0648  NA 137 136 130* 129*  K 4.8 4.2 4.3 4.6  CL 95* 94* 92* 93*  CO2 33* 32 31 25  GLUCOSE 120* 108* 122* 175*  BUN '14 14 9 15  '$ CREATININE 1.11 0.98 0.85 1.12  CALCIUM 9.1 9.4 9.1 9.4   GFR Estimated Creatinine Clearance: 74.4 mL/min (by C-G formula based on SCr of 1.12 mg/dL). Liver Function Tests: Recent Labs  Lab 10/11/20 1545  AST 22  ALT 19  ALKPHOS 82  BILITOT 0.4  PROT 6.7  ALBUMIN 3.4*   No results for input(s): LIPASE, AMYLASE in the last 168 hours. No results for input(s): AMMONIA in the last 168 hours. Coagulation profile Recent Labs  Lab  10/11/20 2028  INR 1.3*    CBC: Recent Labs  Lab 10/11/20 1545 10/12/20 0458 10/13/20 0648 10/15/20 0648  WBC 11.5* 10.4 10.6* 13.6*  NEUTROABS 8.2*  --   --   --   HGB 11.1* 12.1* 10.8* 12.1*  HCT 38.8* 41.8 35.9* 39.0  MCV 84.0 85.0 80.9 79.1*  PLT 249 249 234 282   Cardiac Enzymes: No results for input(s): CKTOTAL, CKMB, CKMBINDEX, TROPONINI in the last 168 hours. BNP (last 3 results) No results for input(s): PROBNP in the last 8760 hours. CBG: Recent Labs  Lab 10/14/20 1151 10/14/20 1617 10/14/20 2120 10/15/20 0743 10/15/20 1224  GLUCAP 138* 137* 163* 188* 170*   D-Dimer: No results for input(s): DDIMER in the last 72 hours. Hgb A1c: No results for input(s): HGBA1C in the last 72 hours. Lipid  Profile: No results for input(s): CHOL, HDL, LDLCALC, TRIG, CHOLHDL, LDLDIRECT in the last 72 hours. Thyroid function studies: Recent Labs    10/13/20 1846  TSH 1.977   Anemia work up: No results for input(s): VITAMINB12, FOLATE, FERRITIN, TIBC, IRON, RETICCTPCT in the last 72 hours. Sepsis Labs: Recent Labs  Lab 10/11/20 1545 10/12/20 0458 10/13/20 0648 10/15/20 0648  WBC 11.5* 10.4 10.6* 13.6*    Microbiology Recent Results (from the past 240 hour(s))  SARS CORONAVIRUS 2 (TAT 6-24 HRS) Nasopharyngeal Nasopharyngeal Swab     Status: None   Collection Time: 10/11/20  6:05 PM   Specimen: Nasopharyngeal Swab  Result Value Ref Range Status   SARS Coronavirus 2 NEGATIVE NEGATIVE Final    Comment: (NOTE) SARS-CoV-2 target nucleic acids are NOT DETECTED.  The SARS-CoV-2 RNA is generally detectable in upper and lower respiratory specimens during the acute phase of infection. Negative results do not preclude SARS-CoV-2 infection, do not rule out co-infections with other pathogens, and should not be used as the sole basis for treatment or other patient management decisions. Negative results must be combined with clinical observations, patient history, and  epidemiological information. The expected result is Negative.  Fact Sheet for Patients: SugarRoll.be  Fact Sheet for Healthcare Providers: https://www.woods-mathews.com/  This test is not yet approved or cleared by the [place] FDA and  has been authorized for detection and/or diagnosis of SARS-CoV-2 by FDA under an Emergency Use Authorization (EUA). This EUA will remain  in effect (meaning this test can be used) for the duration of the COVID-19 declaration under Se ction 564(b)(1) of the Act, 21 U.S.C. section 360bbb-3(b)(1), unless the authorization is terminated or revoked sooner.  Performed at [facility], [place] [address] [place], [place] [address]   Culture, blood (Routine X 2) w Reflex to ID Panel     Status: None (Preliminary result)   Collection Time: 10/14/20 10:14 AM   Specimen: BLOOD  Result Value Ref Range Status   Specimen Description BLOOD LEFT ANTECUBITAL  Final   Special Requests   Final    BOTTLES DRAWN AEROBIC AND ANAEROBIC Blood Culture adequate volume   Culture   Final    NO GROWTH < 24 HOURS Performed at [facility], [address] [place], [place] [address]    Report Status PENDING  Incomplete    Procedures and diagnostic studies:  DG CHEST PORT 1 VIEW  Result Date: 10/14/2020 CLINICAL DATA:  Fever, weakness, negative COVID-19 test on 10/11/2020; history COPD, CHF, diabetes mellitus, hypertension, former smoker EXAM: PORTABLE CHEST 1 VIEW COMPARISON:  Portable exam 1039 hours compared to 02/20/2020 FINDINGS: Enlargement of cardiac silhouette. Mediastinal contours and pulmonary vascularity normal. Atherosclerotic calcification aorta. Lungs clear. No pulmonary infiltrate, pleural effusion or pneumothorax. Diffuse osseous demineralization. IMPRESSION: Enlargement of cardiac silhouette. No acute abnormalities. Aortic Atherosclerosis (ICD10-I70.0). Electronically Signed   By: Lavonia Dana M.D.   On: 10/14/2020  10:58    Medications:   . apixaban  5 mg Oral BID  . aspirin  81 mg Oral Daily  . cholecalciferol  1,000 Units Oral BID  . docusate sodium  100 mg Oral BID  . DULoxetine  30 mg Oral q morning - 10a  . fluticasone furoate-vilanterol  1 puff Inhalation Daily  . gabapentin  600 mg Oral QHS  . insulin aspart  0-15 Units Subcutaneous TID WC  . insulin glargine  20 Units Subcutaneous QHS  . melatonin  6 mg Oral QHS  . metoprolol tartrate  25 mg Oral BID  . OLANZapine zydis  5 mg Oral BID  . pantoprazole  40 mg Oral BID AC  . simvastatin  40 mg Oral QPM  . umeclidinium bromide  1 puff Inhalation Daily   Continuous Infusions: . sodium chloride 50 mL/hr at 10/15/20 1223  . cefTRIAXone (ROCEPHIN)  IV 1 g (10/15/20 1220)     LOS: 3 days   Geradine Girt  [facility]   How to contact the [facility] Attending or Consulting provider [place] or covering provider during after hours [place], for this patient?  1. Check the care team in [facility] and look for a) attending/consulting TRH provider listed and b) the [facility] team listed 2. Log into [email or web address] and use [place]'s universal password to access. If you do not have the password, please contact the hospital operator. 3. Locate the [facility] provider you are looking for under [facility] and page to a number that you can be directly reached. 4. If you still have difficulty reaching the provider, please page the [facility] (Director on Call) for the Hospitalists listed on amion for assistance.  10/15/2020, 2:02 PM

## 2020-10-15 NOTE — Progress Notes (Signed)
Patient refusing all medications this morning. Also refusing breakfast and continues to say "no." Will continue to attempt feeding, will continue to monitor.

## 2020-10-15 NOTE — Progress Notes (Signed)
Patient combative while taking vitals, kept attempting to turn on left side while taking vitals. Asked patient to be still, but patient only yelled and moaned. Did not respond verbally with words when asked to be still.

## 2020-10-15 NOTE — TOC Progression Note (Signed)
Transition of Care [facility]) - Progression Note    Patient Details  Name: Rick Cruz MRN: [id number] Date of Birth: 1946-08-28  Transition of Care [facility]) CM/SW [place], [place] Phone Number: 10/15/2020, 1:18 PM  Clinical Narrative:    CSW spoke with pts mother about bed offer from [facility], per pts mother she would reach out to pts son for decision making. Pts mother and son are agreeable to [place]. CSW spoke with pts wife who appreciates the updates. She states that she has often recommended he go to rehab and he would not agree. She is hopeful that he will not change his mind as she feels he will benefit from SNF. She is agreeable with [facility] as well. Pts wife states that she was updated that pt is not wanting to answer questions and will not answer the phone. TOC to speak with pt about bed offer. TOC to follow   Expected Discharge Plan: [place] Barriers to Discharge: Continued Medical Work up  Expected Discharge Plan and Services Expected Discharge Plan: [place] In-house Referral: Clinical Social Work Discharge Planning Services: NA Post Acute Care Choice: [place] arrangements for the past 2 months: Single Family Home                 DME Arranged: N/A DME Agency: NA       HH Arranged: PT HH Agency: [facility] Date [place]: 10/12/20 Time [place]: [place] Representative spoke with at [place]: [place] ([place]) Interventions    Readmission Risk Interventions No flowsheet data found.

## 2020-10-15 NOTE — Progress Notes (Signed)
   10/15/20 2037  Assess: MEWS Score  Temp 99.9 F (37.7 C)  BP 117/78  Pulse Rate (!) 144  Resp 20  SpO2 93 %  O2 Device Nasal Cannula  Assess: MEWS Score  MEWS Temp 0  MEWS Systolic 0  MEWS Pulse 3  MEWS RR 0  MEWS LOC 0  MEWS Score 3  MEWS Score Color Yellow  Assess: if the MEWS score is Yellow or Red  Were vital signs taken at a resting state? Yes  Focused Assessment No change from prior assessment  Early Detection of Sepsis Score *See Row Information* Medium  MEWS guidelines implemented *See Row Information* Yes  Treat  MEWS Interventions Escalated (See documentation below) (notified gina lurz AC)  Take Vital Signs  Increase Vital Sign Frequency  Yellow: Q 2hr X 2 then Q 4hr X 2, if remains yellow, continue Q 4hrs  Escalate  MEWS: Escalate Yellow: discuss with charge nurse/RN and consider discussing with provider and RRT  Notify: Charge Nurse/RN  Name of Charge Nurse/RN Notified Scientist, water quality, RN (notified gina Paulla Dolly, RN/AC since I am charge tonight)  Date Charge Nurse/RN Notified 10/15/20  Time Charge Nurse/RN Notified 2115  Notify: Provider  Provider Name/Title dr Azucena Freed  Date Provider Notified 10/15/20  Time Provider Notified 2115  Notification Type Page  Notification Reason Other (Comment) (HR tachy 120s-130s)  Response Other (Comment) (awaiting response, HR 110s now)

## 2020-10-16 LAB — URINE CULTURE

## 2020-10-16 LAB — BASIC METABOLIC PANEL
Anion gap: 11 (ref 5–15)
BUN: 18 mg/dL (ref 8–23)
CO2: 20 mmol/L — ABNORMAL LOW (ref 22–32)
Calcium: 9 mg/dL (ref 8.9–10.3)
Chloride: 100 mmol/L (ref 98–111)
Creatinine, Ser: 1.08 mg/dL (ref 0.61–1.24)
GFR, Estimated: >60 mL/min (ref >60)
Glucose, Bld: 174 mg/dL — ABNORMAL HIGH (ref 70–99)
Potassium: 4.3 mmol/L (ref 3.5–5.1)
Sodium: 131 mmol/L — ABNORMAL LOW (ref 135–145)

## 2020-10-16 LAB — GLUCOSE, CAPILLARY
Glucose-Capillary: 171 mg/dL — ABNORMAL HIGH (ref 70–99)
Glucose-Capillary: 151 mg/dL — ABNORMAL HIGH (ref 70–99)
Glucose-Capillary: 176 mg/dL — ABNORMAL HIGH (ref 70–99)
Glucose-Capillary: 139 mg/dL — ABNORMAL HIGH (ref 70–99)

## 2020-10-16 MED ORDER — METOPROLOL TARTRATE 5 MG/5ML IV SOLN
5.0000 mg | Freq: Three times a day (TID) | INTRAVENOUS | Status: DC
  Administered 2020-10-16 – 2020-10-18 (×6): 5 mg via INTRAVENOUS
  Filled 2020-10-16 (×6): qty 5

## 2020-10-16 NOTE — TOC Progression Note (Signed)
Transition of Care [facility]) - Progression Note    Patient Details  Name: BION TODOROV MRN: [id number] Date of Birth: 1946/06/08  Transition of Care [facility]) CM/SW Contact  Shade Flood, LCSW Phone Number: 10/16/2020, 10:54 AM  Clinical Narrative:     TOC following. Met with pt today to assess orientation. Pt was able to state his name though stated that he did not know where he was at nor what day/time it was.   Family agreeable to [facility] at dc. MD states pt is not yet medically stable for dc.  Assigned TOC will follow.  Expected Discharge Plan: [place] Barriers to Discharge: Continued Medical Work up  Expected Discharge Plan and Services Expected Discharge Plan: [place] In-house Referral: Clinical Social Work Discharge Planning Services: NA Post Acute Care Choice: [place] arrangements for the past 2 months: Single Family Home                 DME Arranged: N/A DME Agency: NA       HH Arranged: PT HH Agency: [facility] Date [place]: 10/12/20 Time [place]: [place] Representative spoke with at [place]: [place] ([place]) Interventions    Readmission Risk Interventions No flowsheet data found.

## 2020-10-16 NOTE — Progress Notes (Signed)
Progress Note    Rick Cruz  [id number] DOB: 1946/07/02  DOA: 10/11/2020 PCP: Vassie Moment, NP (Inactive)    Brief Narrative:    Medical records reviewed and are as summarized below:  Rick Cruz is an 75 y.o. male with medical history significant of osteoarthritis, history of other nonhemorrhagic stroke, right eye blindness, chronic diastolic heart failure, chronic respiratory failure due to COPD on home oxygen, sleep apnea noncompliant with BiPAP, history of tobacco abuse, type II DM, hyperlipidemia, hypertension, history of unspecified schizophrenia who is coming to the emergency department due to right lower extremity weakness for the past 2 weeks.    Assessment/Plan:   Principal Problem:   Ischemic cerebrovascular accident (CVA) ([place]) Active Problems:   Obstructive sleep apnea   Essential hypertension   Paranoid schizophrenia ([place])   Type 2 diabetes mellitus without complication, with long-term current use of insulin (HCC)   Chronic respiratory failure 02 dep with hypercarbia   Morbid obesity (HCC)   Chronic diastolic CHF (congestive heart failure) (HCC)   Paroxysmal atrial fibrillation (HCC)   Depression   Diabetic neuropathy (HCC)   Dyslipidemia   Gastroesophageal reflux disease   Class 1 obesity   CVA (cerebral vascular accident) ([place])   Fever causing AMS/delirium -due to UTI? 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 -passed Swallow screen in ER. -PT/OT/SLP.- home health -LDL: 88 (on statin) -hemoglobin A1c: 6.7 (goal <7) -carotid Doppler: right: Peak velocity suggests 50%-69% stenosis, with the ICA/ CCA ratio suggesting a lesser degree of stenosis. If establishing a more accurate degree of stenosis  is required, cerebral angiogram should be considered, or as a second best test, CTA. --echocardiogram: Left ventricular ejection fraction, by estimation, is 55 to 60%. The  left ventricle has normal function. The left ventricle has no regional  wall motion abnormalities. There is moderate left ventricular hypertrophy.  Left ventricular diastolic  parameters are indeterminate. -Discussed with neurology- additional ASA 81 mg x 7 days on top of eliquis    Essential hypertension -resume BB at a lower dose- will give IV while not taking PO    Type 2 diabetes mellitus without complication, with long-term current use of insulin (HCC) Carbohydrate modified diet. Continue Lantus 20 units at bedtime. -SSI -hemoglobin A1c <7    Chronic respiratory failure  with hypercarbia Continue supplemental oxygen- goal sat of 88-92% Continue Trelegy Ellipta daily. Continuous Spiriva daily. Rescue MDI or nebs as needed.  Hyponatremia -monitor -has been low in the past as well- responded to IVF    Chronic diastolic CHF (congestive heart failure) (HCC) No signs of decompensation. Holding beta-blocker to allow for permissive HTN    Paroxysmal atrial fibrillation (HCC) [place] VASc Score of at least 5. Off apixaban due to CVA (per ER: Neurology was contacted who recommended to stop apixaban and begin aspirin) Off metoprolol to allow permissive hypertension. Continue cardiac telemetry.    Paranoid schizophrenia ([place]) -change seroquel to zyprexa while refusing meds    Depression Continue duloxetine 30 mg p.o. daily. When able to take PO    Diabetic neuropathy (HCC) Continue gabapentin 600 mg p.o. bedtime.    Dyslipidemia On simvastatin- not at goal -LDL: 88 -goal <70    Obstructive sleep apnea -does not wear CPAP  obesity  Body mass index is 32.14 kg/m.   Family Communication/Anticipated D/C date and plan/Code Status   DVT prophylaxis: eliquis Code Status: Full Code.   Disposition Plan: Status is: inpt Spoke with wife 1/29  The patient will require care spanning > 2 midnights and should be moved to inpatient because: Inpatient level of care appropriate due to severity of illness  Dispo: The patient is from: Home              Anticipated d/c is to: tbd- SNF?              Anticipated d/c date is: 2 days              Patient currently is not medically stable to d/c.- await resolution of AMS   Difficult to place patient No         Medical Consultants:    Neurology  Subjective:   Says he does not like carrots  Objective:    Vitals:   10/16/20 0458 10/16/20 0800 10/16/20 0929 10/16/20 1253  BP: (!) 158/97  (!) 185/96 (!) 137/98  Pulse: (!) 125 (!) 116 (!) 103 (!) 101  Resp: '17  16 20  '$ Temp: 98.3 F (36.8 C)  98.8 F (37.1 C) 99 F (37.2 C)  TempSrc:   Oral Oral  SpO2: (!) 89%  94% 97%  Weight:      Height:        Intake/Output Summary (Last 24 hours) at 10/16/2020 1500 Last data filed at 10/16/2020 1016 Gross per 24 hour  Intake 244.29 ml  Output 600 ml  Net -355.71 ml   Filed Weights   10/11/20 1155  Weight: 107.5 kg    Exam:   General: Appearance:    Obese male in no acute distress     Lungs:      respirations unlabored  Heart:    Tachycardic.   MS:   All extremities are intact.   Neurologic:   Awake, alert- more communicative this PM     Data Reviewed:   I have personally reviewed following labs and imaging studies:  Labs: Labs show the following:   Basic Metabolic Panel: Recent Labs  Lab 10/11/20 1545 10/12/20 0458 10/13/20 0648 10/15/20 0648 10/16/20 1104  NA 137 136 130* 129* 131*  K 4.8 4.2 4.3 4.6 4.3  CL 95* 94* 92* 93* 100  CO2 33* 32 31 25 20*  GLUCOSE 120* 108* 122* 175* 174*  BUN '14 14 9 15 18  '$ CREATININE 1.11 0.98 0.85 1.12 1.08  CALCIUM 9.1 9.4 9.1 9.4 9.0   GFR Estimated Creatinine Clearance: 77.2 mL/min (by C-G formula based on SCr of 1.08 mg/dL). Liver Function Tests: Recent  Labs  Lab 10/11/20 1545  AST 22  ALT 19  ALKPHOS 82  BILITOT 0.4  PROT 6.7  ALBUMIN 3.4*   No results for input(s): LIPASE, AMYLASE in the last 168 hours. No results for input(s): AMMONIA in the last 168 hours. Coagulation profile Recent Labs  Lab 10/11/20 2028  INR 1.3*    CBC: Recent Labs  Lab 10/11/20 1545 10/12/20 0458 10/13/20 0648 10/15/20 0648  WBC 11.5* 10.4 10.6* 13.6*  NEUTROABS 8.2*  --   --   --   HGB 11.1* 12.1* 10.8* 12.1*  HCT 38.8* 41.8 35.9* 39.0  MCV 84.0 85.0 80.9 79.1*  PLT 249 249 234 282   Cardiac Enzymes: No results for input(s): CKTOTAL, CKMB, CKMBINDEX, TROPONINI in the last 168 hours. BNP (last 3 results)  No results for input(s): PROBNP in the last 8760 hours. CBG: Recent Labs  Lab 10/15/20 0743 10/15/20 1224 10/15/20 1643 10/16/20 0744 10/16/20 1212  GLUCAP 188* 170* 166* 171* 151*   D-Dimer: No results for input(s): DDIMER in the last 72 hours. Hgb A1c: No results for input(s): HGBA1C in the last 72 hours. Lipid Profile: No results for input(s): CHOL, HDL, LDLCALC, TRIG, CHOLHDL, LDLDIRECT in the last 72 hours. Thyroid function studies: Recent Labs    10/13/20 1846  TSH 1.977   Anemia work up: No results for input(s): VITAMINB12, FOLATE, FERRITIN, TIBC, IRON, RETICCTPCT in the last 72 hours. Sepsis Labs: Recent Labs  Lab 10/11/20 1545 10/12/20 0458 10/13/20 0648 10/15/20 0648  WBC 11.5* 10.4 10.6* 13.6*    Microbiology Recent Results (from the past 240 hour(s))  SARS CORONAVIRUS 2 (TAT 6-24 HRS) Nasopharyngeal Nasopharyngeal Swab     Status: None   Collection Time: 10/11/20  6:05 PM   Specimen: Nasopharyngeal Swab  Result Value Ref Range Status   SARS Coronavirus 2 NEGATIVE NEGATIVE Final    Comment: (NOTE) SARS-CoV-2 target nucleic acids are NOT DETECTED.  The SARS-CoV-2 RNA is generally detectable in upper and lower respiratory specimens during the acute phase of infection. Negative results do not  preclude SARS-CoV-2 infection, do not rule out co-infections with other pathogens, and should not be used as the sole basis for treatment or other patient management decisions. Negative results must be combined with clinical observations, patient history, and epidemiological information. The expected result is Negative.  Fact Sheet for Patients: SugarRoll.be  Fact Sheet for Healthcare Providers: https://www.woods-mathews.com/  This test is not yet approved or cleared by the [place] FDA and  has been authorized for detection and/or diagnosis of SARS-CoV-2 by FDA under an Emergency Use Authorization (EUA). This EUA will remain  in effect (meaning this test can be used) for the duration of the COVID-19 declaration under Se ction 564(b)(1) of the Act, 21 U.S.C. section 360bbb-3(b)(1), unless the authorization is terminated or revoked sooner.  Performed at [facility], [place] [address] [place], [place] [address]   Culture, blood (Routine X 2) w Reflex to ID Panel     Status: None (Preliminary result)   Collection Time: 10/14/20 10:14 AM   Specimen: BLOOD  Result Value Ref Range Status   Specimen Description BLOOD LEFT ANTECUBITAL  Final   Special Requests   Final    BOTTLES DRAWN AEROBIC AND ANAEROBIC Blood Culture adequate volume   Culture   Final    NO GROWTH 2 DAYS Performed at [facility], [address] [place], [place] [address]    Report Status PENDING  Incomplete  Culture, Urine     Status: Abnormal   Collection Time: 10/14/20 10:55 AM   Specimen: Urine, Random  Result Value Ref Range Status   Specimen Description   Final    URINE, RANDOM Performed at [facility], [address] [place], [place] [address]    Special Requests   Final    NONE Performed at [facility], [address] [place], [place] [address]    Culture MULTIPLE SPECIES PRESENT, SUGGEST RECOLLECTION (A)  Final   Report Status 10/16/2020 FINAL   Final    Procedures and diagnostic studies:  No results found.  Medications:   . apixaban  5 mg Oral BID  . aspirin  81 mg Oral Daily  . cholecalciferol  1,000 Units Oral BID  . docusate sodium  100 mg Oral BID  .  DULoxetine  30 mg Oral q morning - 10a  . fluticasone furoate-vilanterol  1 puff Inhalation Daily  . gabapentin  600 mg Oral QHS  . insulin aspart  0-15 Units Subcutaneous TID WC  . insulin glargine  20 Units Subcutaneous QHS  . melatonin  6 mg Oral QHS  . metoprolol tartrate  5 mg Intravenous Q8H  . OLANZapine zydis  5 mg Oral BID  . pantoprazole  40 mg Oral BID AC  . simvastatin  40 mg Oral QPM  . umeclidinium bromide  1 puff Inhalation Daily   Continuous Infusions: . sodium chloride 50 mL/hr at 10/16/20 1130     LOS: 4 days   Geradine Girt  [facility]   How to contact the [facility] Attending or Consulting provider [place] or covering provider during after hours [place], for this patient?  1. Check the care team in [facility] and look for a) attending/consulting TRH provider listed and b) the [facility] team listed 2. Log into [email or web address] and use [place]'s universal password to access. If you do not have the password, please contact the hospital operator. 3. Locate the [facility] provider you are looking for under [facility] and page to a number that you can be directly reached. 4. If you still have difficulty reaching the provider, please page the [facility] (Director on Call) for the Hospitalists listed on amion for assistance.  10/16/2020, 3:00 PM

## 2020-10-16 NOTE — Evaluation (Signed)
Clinical/Bedside Swallow Evaluation Patient Details  Name: Rick Cruz MRN: [id number] Date of Birth: [birth year]  Today's Date: 10/16/2020 Time: SLP Start Time (ACUTE ONLY): 58 SLP Stop Time (ACUTE ONLY): 1555 SLP Time Calculation (min) (ACUTE ONLY): 18.87 min  Past Medical History:  Past Medical History:  Diagnosis Date  . Arthritis   . 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 CHF (congestive heart failure) (HCC)    diastolic   . Chronic pain   . COPD (chronic obstructive pulmonary disease) ([place])   . Depression   . Diabetes mellitus without complication (HCC)    insulin pump  . Hypercholesterolemia   . Hypertension   . On home O2    3 liters  . Oxygen deficiency   . Schizophrenia ([place])   . Sleep apnea    noncompliant with BiPAP  . Sleep apnea   . Tobacco abuse    Past Surgical History:  Past Surgical History:  Procedure Laterality Date  . COLON SURGERY  March 2010   secondary to large colon polyp, final path per discharge summary notes tubulovillous adenoma.   . COLONOSCOPY  July 2011   Dr. Benson [place]: multiple polyps, internal and external hemorrhoids, diverticulosis. tubular adenoma  . ESOPHAGOGASTRODUODENOSCOPY (EGD) WITH PROPOFOL N/A 04/20/2013   Procedure: ESOPHAGOGASTRODUODENOSCOPY (EGD) WITH PROPOFOL;  Surgeon: Daneil Dolin, MD;  Location: [facility];  Service: Endoscopy;  Laterality: N/A;  . KNEE SURGERY     X2   HPI:  Rick Cruz is an 75 y.o. male with medical history significant of osteoarthritis, history of other nonhemorrhagic stroke, right eye blindness, chronic diastolic heart failure, chronic respiratory failure due to COPD on home oxygen, sleep apnea noncompliant with BiPAP, history of tobacco abuse, type II DM, hyperlipidemia, hypertension, history of unspecified schizophrenia who is coming to the emergency department due to right lower extremity weakness for the past 2 weeks. MRI: acute right pontine infarct and likely occlusion of the right vertebral  artery. BSE ordered   Assessment / Plan / Recommendation Clinical Impression  Clinical swallowing evaluation completed while Pt was sitting upright in bed; Pt consumed thin liquids without overt s/sx of aspiration. Pt stated his name and was verbally responsive with SLP. Despite edentulous status Pt reports he can masticate food without difficulty, however when presented with regular (cracker) Pt demonstrated very prolonged mastication and some larger pieces were held requiring liquid wash to clear oral cavity; questionably related to lack of dentition or mentation. Recommend downgrade diet to D2/fine chop diet and continue with thin liquids; meds are ok whole with liquids. ST will f/u X1 to ensure diet tolerance and assess for upgrade with possible improved mentation. Thank you SLP Visit Diagnosis: Dysphagia, unspecified (R13.10)    Aspiration Risk  Mild aspiration risk    Diet Recommendation Dysphagia 2 (Fine chop);Thin liquid   Liquid Administration via: Cup;Straw Medication Administration: Whole meds with liquid Supervision: Patient able to self feed Compensations: Minimize environmental distractions;Slow rate Postural Changes: Seated upright at 90 degrees;Remain upright for at least 30 minutes after po intake    Other  Recommendations Oral Care Recommendations: Oral care BID   Follow up Recommendations        Frequency and Duration min 1 x/week  1 week       Prognosis Prognosis for Safe Diet Advancement: Good      Swallow Study   General Date of Onset: 10/16/20 HPI: Rick Cruz is an 75 y.o. male with medical history significant of  osteoarthritis, history of other nonhemorrhagic stroke, right eye blindness, chronic diastolic heart failure, chronic respiratory failure due to COPD on home oxygen, sleep apnea noncompliant with BiPAP, history of tobacco abuse, type II DM, hyperlipidemia, hypertension, history of unspecified schizophrenia who is coming to the emergency department  due to right lower extremity weakness for the past 2 weeks. 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 BSE ordered Type of Study: Bedside Swallow Evaluation Previous Swallow Assessment: none in chart Diet Prior to this Study: Regular;Thin liquids Temperature Spikes Noted: No Respiratory Status: Room air History of Recent Intubation: No Behavior/Cognition: Alert;Cooperative Oral Cavity Assessment: Within Functional Limits Oral Cavity - Dentition: Edentulous;Missing dentition Self-Feeding Abilities: Able to feed self;Needs assist;Needs set up Patient Positioning: Upright in bed Baseline Vocal Quality: Normal Volitional Cough: Strong Volitional Swallow: Able to elicit    Oral/Motor/Sensory Function Overall Oral Motor/Sensory Function: Within functional limits   Ice Chips Ice chips: Within functional limits   Thin Liquid Thin Liquid: Within functional limits    Nectar Thick Nectar Thick Liquid: Not tested   Honey Thick Honey Thick Liquid: Not tested   Puree Puree: Within functional limits   Solid     Solid: Impaired Presentation: Spoon Oral Phase Impairments: Poor awareness of bolus;Impaired mastication;Reduced lingual movement/coordination Oral Phase Functional Implications: Oral residue Pharyngeal Phase Impairments: Multiple swallows      Rick Cruz H. Roddie Mc, CCC-SLP Speech Language Pathologist  Wende Bushy 10/16/2020,3:57 PM

## 2020-10-17 LAB — CBC
HCT: 37.7 % — ABNORMAL LOW (ref 39.0–52.0)
Hemoglobin: 11.3 g/dL — ABNORMAL LOW (ref 13.0–17.0)
MCH: 24.3 pg — ABNORMAL LOW (ref 26.0–34.0)
MCHC: 30 g/dL (ref 30.0–36.0)
MCV: 81.1 fL (ref 80.0–100.0)
Platelets: 258 10*3/uL (ref 150–400)
RBC: 4.65 MIL/uL (ref 4.22–5.81)
RDW: 15.8 % — ABNORMAL HIGH (ref 11.5–15.5)
WBC: 9 10*3/uL (ref 4.0–10.5)
nRBC: 0 % (ref 0.0–0.2)

## 2020-10-17 LAB — BASIC METABOLIC PANEL
Anion gap: 8 (ref 5–15)
BUN: 17 mg/dL (ref 8–23)
CO2: 27 mmol/L (ref 22–32)
Calcium: 9.1 mg/dL (ref 8.9–10.3)
Chloride: 102 mmol/L (ref 98–111)
Creatinine, Ser: 0.95 mg/dL (ref 0.61–1.24)
GFR, Estimated: >60 mL/min (ref >60)
Glucose, Bld: 136 mg/dL — ABNORMAL HIGH (ref 70–99)
Potassium: 4 mmol/L (ref 3.5–5.1)
Sodium: 137 mmol/L (ref 135–145)

## 2020-10-17 LAB — GLUCOSE, CAPILLARY
Glucose-Capillary: 152 mg/dL — ABNORMAL HIGH (ref 70–99)
Glucose-Capillary: 138 mg/dL — ABNORMAL HIGH (ref 70–99)
Glucose-Capillary: 141 mg/dL — ABNORMAL HIGH (ref 70–99)
Glucose-Capillary: 186 mg/dL — ABNORMAL HIGH (ref 70–99)
Glucose-Capillary: 180 mg/dL — ABNORMAL HIGH (ref 70–99)
Glucose-Capillary: 245 mg/dL — ABNORMAL HIGH (ref 70–99)

## 2020-10-17 MED ORDER — QUETIAPINE FUMARATE 25 MG PO TABS
25.0000 mg | ORAL_TABLET | Freq: Every morning | ORAL | Status: DC
  Administered 2020-10-18: 25 mg via ORAL
  Filled 2020-10-17: qty 1

## 2020-10-17 MED ORDER — QUETIAPINE FUMARATE 25 MG PO TABS
50.0000 mg | ORAL_TABLET | Freq: Every day | ORAL | Status: DC
  Administered 2020-10-17: 50 mg via ORAL
  Filled 2020-10-17: qty 2

## 2020-10-17 NOTE — Progress Notes (Signed)
  Speech Language Pathology Treatment: Dysphagia  Patient Details Name: Rick Cruz MRN: [id number] DOB: 11/26/1945 Today's Date: 10/17/2020 Time: [id number] SLP Time Calculation (min) (ACUTE ONLY): 1400.17 min  Assessment / Plan / Recommendation Clinical Impression  Ongoing diagnostic dysphagia therapy targeting trials of thin liquids and regular textures. Pt presented with a congested sounding cough at baseline. He consumed thin liquids initially without incident but then demonstrated a delayed congested sounding coughing episode. Pt continued to demonstrate difficulty completely masticating regular textures requiring cues and liquid wash to clear oral residue. Congested cough and delayed coughing was a slightly different presentation than initial BSE therefore recommend ST f/u to ensure diet tolerance.Recommend continue with D2/fine chop diet and thin liquids; ST will continue to follow    HPI HPI: Rick Cruz is an 75 y.o. male with medical history significant of osteoarthritis, history of other nonhemorrhagic stroke, right eye blindness, chronic diastolic heart failure, chronic respiratory failure due to COPD on home oxygen, sleep apnea noncompliant with BiPAP, history of tobacco abuse, type II DM, hyperlipidemia, hypertension, history of unspecified schizophrenia who is coming to the emergency department due to right lower extremity weakness for the past 2 weeks. 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 BSE ordered      SLP Plan  Continue with current plan of care       Recommendations  Diet recommendations: Thin liquid;Dysphagia 2 (fine chop) Liquids provided via: Cup;Straw Medication Administration: Whole meds with liquid Supervision: Patient able to self feed Compensations: Minimize environmental distractions;Slow rate Postural Changes and/or Swallow Maneuvers: Seated upright 90 degrees                Oral Care Recommendations:  Oral care BID Follow up Recommendations: None SLP Visit Diagnosis: Dysphagia, unspecified (R13.10) Plan: Continue with current plan of care       Edyth Glomb H. Roddie Mc, CCC-SLP Speech Language Pathologist    Wende Bushy 10/17/2020, 2:03 PM

## 2020-10-17 NOTE — Progress Notes (Signed)
PT Cancellation Note  Patient Details Name: Rick Cruz MRN: [id number] DOB: 1946/08/20   Cancelled Treatment:    Reason Eval/Treat Not Completed: Fatigue/lethargy limiting ability to participate;Patient's level of consciousness. PT unable to awaken/arouse patient; attempted verbal and physical touch/movement. Patient total assist to boost up in bed with use of Trendelenburg bed positioning.   Floria Raveling. Hartnett-Rands, MS, PT Per [place] [id number] 10/17/2020, 12:55 PM

## 2020-10-17 NOTE — Progress Notes (Signed)
Progress Note    Rick Cruz  [id number] DOB: 06/21/1946  DOA: 10/11/2020 PCP: Vassie Moment, NP (Inactive)    Brief Narrative:    Medical records reviewed and are as summarized below:  Rick Cruz is an 75 y.o. male with medical history significant of osteoarthritis, history of other nonhemorrhagic stroke, right eye blindness, chronic diastolic heart failure, chronic respiratory failure due to COPD on home oxygen, sleep apnea noncompliant with BiPAP, history of tobacco abuse, type II DM, hyperlipidemia, hypertension, history of unspecified schizophrenia who is coming to the emergency department due to right lower extremity weakness for the past 2 weeks.    Assessment/Plan:   Principal Problem:   Ischemic cerebrovascular accident (CVA) ([place]) Active Problems:   Obstructive sleep apnea   Essential hypertension   Paranoid schizophrenia ([place])   Type 2 diabetes mellitus without complication, with long-term current use of insulin (HCC)   Chronic respiratory failure 02 dep with hypercarbia   Morbid obesity (HCC)   Chronic diastolic CHF (congestive heart failure) (HCC)   Paroxysmal atrial fibrillation (HCC)   Depression   Diabetic neuropathy (HCC)   Dyslipidemia   Gastroesophageal reflux disease   Class 1 obesity   CVA (cerebral vascular accident) ([place])   Fever causing AMS/delirium -due to UTI? 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 -passed Swallow screen in ER. -PT/OT/SLP.- home health -LDL: 88 (on statin) -hemoglobin A1c: 6.7 (goal <7) -carotid Doppler: right: Peak velocity suggests 50%-69% stenosis, with the ICA/ CCA ratio suggesting a lesser degree of stenosis. If establishing a more accurate degree of stenosis is  required, cerebral angiogram should be considered, or as a second best test, CTA. --echocardiogram: Left ventricular ejection fraction, by estimation, is 55 to 60%. The  left ventricle has normal function. The left ventricle has no regional  wall motion abnormalities. There is moderate left ventricular hypertrophy.  Left ventricular diastolic  parameters are indeterminate. -Discussed with neurology- additional ASA 81 mg x 7 days on top of eliquis    Essential hypertension -resume BB at a lower dose- will give IV while not taking PO    Type 2 diabetes mellitus without complication, with long-term current use of insulin (HCC) Carbohydrate modified diet. Continue Lantus 20 units at bedtime. -SSI -hemoglobin A1c <7    Chronic respiratory failure  with hypercarbia Continue supplemental oxygen- goal sat of 88-92% Continue Trelegy Ellipta daily. Continuous Spiriva daily. Rescue MDI or nebs as needed.  Hyponatremia -monitor -has been low in the past as well- responded to IVF    Chronic diastolic CHF (congestive heart failure) (HCC) No signs of decompensation. Holding beta-blocker to allow for permissive HTN    Paroxysmal atrial fibrillation (HCC) [place] VASc Score of at least 5. Off apixaban due to CVA (per ER: Neurology was contacted who recommended to stop apixaban and begin aspirin) Off metoprolol to allow permissive hypertension. Continue cardiac telemetry.    Paranoid schizophrenia ([place]) -change seroquel to zyprexa while refusing meds    Depression Continue duloxetine 30 mg p.o. daily. When able to take PO    Diabetic neuropathy (HCC) Continue gabapentin 600 mg p.o. bedtime.    Dyslipidemia On simvastatin- not at goal -LDL: 88 -goal <70    Obstructive sleep apnea -does not wear CPAP  obesity Body mass index is 32.14 kg/m.  Family Communication/Anticipated D/C date and plan/Code Status   DVT prophylaxis: eliquis Code Status: Full Code.   Disposition Plan: Status is: inpt Spoke with wife 1/29  The patient will require care spanning > 2 midnights and should be moved to inpatient because: Inpatient level of care appropriate due to severity of illness  Dispo: The patient is from: Home              Anticipated d/c is to: tbd- SNF?              Anticipated d/c date is:1- 2 days              Patient currently is not medically stable to d/c.- await resolution of AMS   Difficult to place patient No         Medical Consultants:    Neurology  Subjective:   Asking for food, no CP, no SOB  Objective:    Vitals:   10/16/20 2113 10/17/20 0443 10/17/20 0829 10/17/20 1453  BP: (!) 152/80 123/74  128/70  Pulse: (!) 40 91  94  Resp: 20 20    Temp: 98.8 F (37.1 C) 98.1 F (36.7 C)  98.1 F (36.7 C)  TempSrc: Oral Oral  Oral  SpO2: 96% 97% 95% 95%  Weight:      Height:        Intake/Output Summary (Last 24 hours) at 10/17/2020 1529 Last data filed at 10/17/2020 [id number] Gross per 24 hour  Intake -  Output 525 ml  Net -525 ml   Filed Weights   10/11/20 1155  Weight: 107.5 kg    Exam:   General: Appearance:    Obese male in no acute distress     Lungs:      respirations unlabored     MS:     Neurologic:   Awake, alert, oriented to person/place.      Data Reviewed:   I have personally reviewed following labs and imaging studies:  Labs: Labs show the following:   Basic Metabolic Panel: Recent Labs  Lab 10/12/20 0458 10/13/20 0648 10/15/20 0648 10/16/20 1104 10/17/20 0454  NA 136 130* 129* 131* 137  K 4.2 4.3 4.6 4.3 4.0  CL 94* 92* 93* 100 102  CO2 32 31 25 20* 27  GLUCOSE 108* 122* 175* 174* 136*  BUN '14 9 15 18 17  '$ CREATININE 0.98 0.85 1.12 1.08 0.95  CALCIUM 9.4 9.1 9.4 9.0 9.1   GFR Estimated Creatinine Clearance: 87.8 mL/min (by C-G formula based on SCr of 0.95 mg/dL). Liver Function Tests: Recent Labs  Lab 10/11/20 1545  AST 22  ALT 19  ALKPHOS 82  BILITOT 0.4  PROT 6.7   ALBUMIN 3.4*   No results for input(s): LIPASE, AMYLASE in the last 168 hours. No results for input(s): AMMONIA in the last 168 hours. Coagulation profile Recent Labs  Lab 10/11/20 2028  INR 1.3*    CBC: Recent Labs  Lab 10/11/20 1545 10/12/20 0458 10/13/20 0648 10/15/20 0648 10/17/20 0454  WBC 11.5* 10.4 10.6* 13.6* 9.0  NEUTROABS 8.2*  --   --   --   --   HGB 11.1* 12.1* 10.8* 12.1* 11.3*  HCT 38.8* 41.8 35.9* 39.0 37.7*  MCV 84.0 85.0 80.9 79.1* 81.1  PLT 249 249 234 282 258   Cardiac Enzymes: No results for input(s): CKTOTAL, CKMB, CKMBINDEX, TROPONINI in the last 168 hours. BNP (last 3 results) No results for input(s): PROBNP in the last 8760 hours. CBG: Recent  Labs  Lab 10/16/20 1212 10/16/20 1714 10/16/20 2114 10/17/20 0741 10/17/20 1109  GLUCAP 151* 176* 139* 141* 186*   D-Dimer: No results for input(s): DDIMER in the last 72 hours. Hgb A1c: No results for input(s): HGBA1C in the last 72 hours. Lipid Profile: No results for input(s): CHOL, HDL, LDLCALC, TRIG, CHOLHDL, LDLDIRECT in the last 72 hours. Thyroid function studies: No results for input(s): TSH, T4TOTAL, T3FREE, THYROIDAB in the last 72 hours.  Invalid input(s): FREET3 Anemia work up: No results for input(s): VITAMINB12, FOLATE, FERRITIN, TIBC, IRON, RETICCTPCT in the last 72 hours. Sepsis Labs: Recent Labs  Lab 10/12/20 0458 10/13/20 0648 10/15/20 0648 10/17/20 0454  WBC 10.4 10.6* 13.6* 9.0    Microbiology Recent Results (from the past 240 hour(s))  SARS CORONAVIRUS 2 (TAT 6-24 HRS) Nasopharyngeal Nasopharyngeal Swab     Status: None   Collection Time: 10/11/20  6:05 PM   Specimen: Nasopharyngeal Swab  Result Value Ref Range Status   SARS Coronavirus 2 NEGATIVE NEGATIVE Final    Comment: (NOTE) SARS-CoV-2 target nucleic acids are NOT DETECTED.  The SARS-CoV-2 RNA is generally detectable in upper and lower respiratory specimens during the acute phase of infection.  Negative results do not preclude SARS-CoV-2 infection, do not rule out co-infections with other pathogens, and should not be used as the sole basis for treatment or other patient management decisions. Negative results must be combined with clinical observations, patient history, and epidemiological information. The expected result is Negative.  Fact Sheet for Patients: SugarRoll.be  Fact Sheet for Healthcare Providers: https://www.woods-mathews.com/  This test is not yet approved or cleared by the [place] FDA and  has been authorized for detection and/or diagnosis of SARS-CoV-2 by FDA under an Emergency Use Authorization (EUA). This EUA will remain  in effect (meaning this test can be used) for the duration of the COVID-19 declaration under Se ction 564(b)(1) of the Act, 21 U.S.C. section 360bbb-3(b)(1), unless the authorization is terminated or revoked sooner.  Performed at [facility], [place] [address] [place], [place] [address]   Culture, blood (Routine X 2) w Reflex to ID Panel     Status: None (Preliminary result)   Collection Time: 10/14/20 10:14 AM   Specimen: BLOOD  Result Value Ref Range Status   Specimen Description BLOOD LEFT ANTECUBITAL  Final   Special Requests   Final    BOTTLES DRAWN AEROBIC AND ANAEROBIC Blood Culture adequate volume   Culture   Final    NO GROWTH 3 DAYS Performed at [facility], [address] [place], [place] [address]    Report Status PENDING  Incomplete  Culture, Urine     Status: Abnormal   Collection Time: 10/14/20 10:55 AM   Specimen: Urine, Random  Result Value Ref Range Status   Specimen Description   Final    URINE, RANDOM Performed at [facility], [address] [place], [place] [address]    Special Requests   Final    NONE Performed at [facility], [address], [place], [place] [address]    Culture MULTIPLE SPECIES PRESENT, SUGGEST RECOLLECTION (A)  Final   Report  Status 10/16/2020 FINAL  Final    Procedures and diagnostic studies:  No results found.  Medications:   . apixaban  5 mg Oral BID  . aspirin  81 mg Oral Daily  . cholecalciferol  1,000 Units Oral BID  . docusate sodium  100 mg Oral BID  . DULoxetine  30 mg Oral q  morning - 10a  . fluticasone furoate-vilanterol  1 puff Inhalation Daily  . gabapentin  600 mg Oral QHS  . insulin aspart  0-15 Units Subcutaneous TID WC  . insulin glargine  20 Units Subcutaneous QHS  . melatonin  6 mg Oral QHS  . metoprolol tartrate  5 mg Intravenous Q8H  . pantoprazole  40 mg Oral BID AC  . [START ON 10/18/2020] QUEtiapine  25 mg Oral q AM  . QUEtiapine  50 mg Oral QHS  . simvastatin  40 mg Oral QPM  . umeclidinium bromide  1 puff Inhalation Daily   Continuous Infusions: . sodium chloride 50 mL/hr at 10/17/20 0900     LOS: 5 days   Geradine Girt  [facility]   How to contact the [facility] Attending or Consulting provider [place] or covering provider during after hours [place], for this patient?  1. Check the care team in [facility] and look for a) attending/consulting TR[facility] provider listed and b) the [facility] team listed 2. Log into [email or web address] and use [place] universal password to access. If you do not have the password, please contact the hospital operator. 3. Locate the [facility] provider you are looking for under [facility] and page to a number that you can be directly reached. 4. If you still have difficulty reaching the provider, please page the [facility] (Director on Call) for the Hospitalists listed on amion for assistance.  10/17/2020, 3:29 PM

## 2020-10-18 DIAGNOSIS — Z7901 Long term (current) use of anticoagulants: Secondary | ICD-10-CM | POA: Diagnosis not present

## 2020-10-18 DIAGNOSIS — I639 Cerebral infarction, unspecified: Secondary | ICD-10-CM | POA: Diagnosis not present

## 2020-10-18 DIAGNOSIS — U071 COVID-19: Secondary | ICD-10-CM | POA: Diagnosis not present

## 2020-10-18 DIAGNOSIS — Z79899 Other long term (current) drug therapy: Secondary | ICD-10-CM | POA: Diagnosis not present

## 2020-10-18 DIAGNOSIS — Z7951 Long term (current) use of inhaled steroids: Secondary | ICD-10-CM | POA: Diagnosis not present

## 2020-10-18 DIAGNOSIS — I739 Peripheral vascular disease, unspecified: Secondary | ICD-10-CM | POA: Diagnosis not present

## 2020-10-18 DIAGNOSIS — R5381 Other malaise: Secondary | ICD-10-CM | POA: Diagnosis not present

## 2020-10-18 DIAGNOSIS — S76102A Unspecified injury of left quadriceps muscle, fascia and tendon, initial encounter: Secondary | ICD-10-CM | POA: Diagnosis not present

## 2020-10-18 DIAGNOSIS — S76112A Strain of left quadriceps muscle, fascia and tendon, initial encounter: Secondary | ICD-10-CM | POA: Diagnosis not present

## 2020-10-18 DIAGNOSIS — S8992XA Unspecified injury of left lower leg, initial encounter: Secondary | ICD-10-CM | POA: Diagnosis present

## 2020-10-18 DIAGNOSIS — N3000 Acute cystitis without hematuria: Secondary | ICD-10-CM | POA: Diagnosis not present

## 2020-10-18 DIAGNOSIS — R2689 Other abnormalities of gait and mobility: Secondary | ICD-10-CM | POA: Diagnosis not present

## 2020-10-18 DIAGNOSIS — I480 Paroxysmal atrial fibrillation: Secondary | ICD-10-CM | POA: Diagnosis not present

## 2020-10-18 DIAGNOSIS — G9341 Metabolic encephalopathy: Secondary | ICD-10-CM | POA: Diagnosis not present

## 2020-10-18 DIAGNOSIS — E1165 Type 2 diabetes mellitus with hyperglycemia: Secondary | ICD-10-CM | POA: Diagnosis not present

## 2020-10-18 DIAGNOSIS — E119 Type 2 diabetes mellitus without complications: Secondary | ICD-10-CM | POA: Diagnosis not present

## 2020-10-18 DIAGNOSIS — F32A Depression, unspecified: Secondary | ICD-10-CM | POA: Diagnosis not present

## 2020-10-18 DIAGNOSIS — J9611 Chronic respiratory failure with hypoxia: Secondary | ICD-10-CM | POA: Diagnosis not present

## 2020-10-18 DIAGNOSIS — J449 Chronic obstructive pulmonary disease, unspecified: Secondary | ICD-10-CM | POA: Diagnosis not present

## 2020-10-18 DIAGNOSIS — M542 Cervicalgia: Secondary | ICD-10-CM | POA: Diagnosis not present

## 2020-10-18 DIAGNOSIS — Z87891 Personal history of nicotine dependence: Secondary | ICD-10-CM | POA: Diagnosis not present

## 2020-10-18 DIAGNOSIS — R051 Acute cough: Secondary | ICD-10-CM | POA: Diagnosis not present

## 2020-10-18 DIAGNOSIS — I2510 Atherosclerotic heart disease of native coronary artery without angina pectoris: Secondary | ICD-10-CM | POA: Diagnosis not present

## 2020-10-18 DIAGNOSIS — E785 Hyperlipidemia, unspecified: Secondary | ICD-10-CM | POA: Diagnosis not present

## 2020-10-18 DIAGNOSIS — E1169 Type 2 diabetes mellitus with other specified complication: Secondary | ICD-10-CM | POA: Diagnosis not present

## 2020-10-18 DIAGNOSIS — K5900 Constipation, unspecified: Secondary | ICD-10-CM | POA: Diagnosis not present

## 2020-10-18 DIAGNOSIS — I5032 Chronic diastolic (congestive) heart failure: Secondary | ICD-10-CM | POA: Diagnosis not present

## 2020-10-18 DIAGNOSIS — M6281 Muscle weakness (generalized): Secondary | ICD-10-CM | POA: Diagnosis not present

## 2020-10-18 DIAGNOSIS — Z8616 Personal history of COVID-19: Secondary | ICD-10-CM | POA: Diagnosis not present

## 2020-10-18 DIAGNOSIS — L97509 Non-pressure chronic ulcer of other part of unspecified foot with unspecified severity: Secondary | ICD-10-CM | POA: Diagnosis not present

## 2020-10-18 DIAGNOSIS — I10 Essential (primary) hypertension: Secondary | ICD-10-CM | POA: Diagnosis not present

## 2020-10-18 DIAGNOSIS — W19XXXA Unspecified fall, initial encounter: Secondary | ICD-10-CM | POA: Diagnosis not present

## 2020-10-18 DIAGNOSIS — G4700 Insomnia, unspecified: Secondary | ICD-10-CM | POA: Diagnosis not present

## 2020-10-18 DIAGNOSIS — G4733 Obstructive sleep apnea (adult) (pediatric): Secondary | ICD-10-CM | POA: Diagnosis not present

## 2020-10-18 DIAGNOSIS — E1110 Type 2 diabetes mellitus with ketoacidosis without coma: Secondary | ICD-10-CM | POA: Diagnosis not present

## 2020-10-18 DIAGNOSIS — E11621 Type 2 diabetes mellitus with foot ulcer: Secondary | ICD-10-CM | POA: Diagnosis not present

## 2020-10-18 DIAGNOSIS — R519 Headache, unspecified: Secondary | ICD-10-CM | POA: Diagnosis not present

## 2020-10-18 DIAGNOSIS — Y92129 Unspecified place in nursing home as the place of occurrence of the external cause: Secondary | ICD-10-CM | POA: Diagnosis not present

## 2020-10-18 DIAGNOSIS — K219 Gastro-esophageal reflux disease without esophagitis: Secondary | ICD-10-CM | POA: Diagnosis not present

## 2020-10-18 DIAGNOSIS — E1140 Type 2 diabetes mellitus with diabetic neuropathy, unspecified: Secondary | ICD-10-CM | POA: Diagnosis not present

## 2020-10-18 DIAGNOSIS — R52 Pain, unspecified: Secondary | ICD-10-CM | POA: Diagnosis not present

## 2020-10-18 DIAGNOSIS — Z794 Long term (current) use of insulin: Secondary | ICD-10-CM | POA: Diagnosis not present

## 2020-10-18 DIAGNOSIS — I110 Hypertensive heart disease with heart failure: Secondary | ICD-10-CM | POA: Diagnosis not present

## 2020-10-18 LAB — CBC
HCT: 33.7 % — ABNORMAL LOW (ref 39.0–52.0)
Hemoglobin: 10.1 g/dL — ABNORMAL LOW (ref 13.0–17.0)
MCH: 24.3 pg — ABNORMAL LOW (ref 26.0–34.0)
MCHC: 30 g/dL (ref 30.0–36.0)
MCV: 81.2 fL (ref 80.0–100.0)
Platelets: 230 10*3/uL (ref 150–400)
RBC: 4.15 MIL/uL — ABNORMAL LOW (ref 4.22–5.81)
RDW: 15.4 % (ref 11.5–15.5)
WBC: 8.5 10*3/uL (ref 4.0–10.5)
nRBC: 0 % (ref 0.0–0.2)

## 2020-10-18 LAB — BASIC METABOLIC PANEL
Anion gap: 6 (ref 5–15)
BUN: 15 mg/dL (ref 8–23)
CO2: 27 mmol/L (ref 22–32)
Calcium: 8.7 mg/dL — ABNORMAL LOW (ref 8.9–10.3)
Chloride: 101 mmol/L (ref 98–111)
Creatinine, Ser: 0.87 mg/dL (ref 0.61–1.24)
GFR, Estimated: >60 mL/min (ref >60)
Glucose, Bld: 130 mg/dL — ABNORMAL HIGH (ref 70–99)
Potassium: 4.1 mmol/L (ref 3.5–5.1)
Sodium: 134 mmol/L — ABNORMAL LOW (ref 135–145)

## 2020-10-18 LAB — GLUCOSE, CAPILLARY
Glucose-Capillary: 151 mg/dL — ABNORMAL HIGH (ref 70–99)
Glucose-Capillary: 159 mg/dL — ABNORMAL HIGH (ref 70–99)
Glucose-Capillary: 128 mg/dL — ABNORMAL HIGH (ref 70–99)

## 2020-10-18 LAB — SARS CORONAVIRUS 2 BY RT PCR (HOSPITAL ORDER, PERFORMED IN ~~LOC~~ HOSPITAL LAB): SARS Coronavirus 2: NEGATIVE

## 2020-10-18 MED ORDER — INSULIN ASPART 100 UNIT/ML ~~LOC~~ SOLN
0.0000 [IU] | Freq: Three times a day (TID) | SUBCUTANEOUS | 11 refills | Status: AC
Start: 2020-10-18 — End: ?

## 2020-10-18 MED ORDER — METOPROLOL TARTRATE 25 MG PO TABS: 25.0000 mg | ORAL_TABLET | Freq: Two times a day (BID) | ORAL | Status: AC

## 2020-10-18 MED ORDER — LANTUS SOLOSTAR 100 UNIT/ML ~~LOC~~ SOPN: 20.0000 [IU] | PEN_INJECTOR | Freq: Every day | SUBCUTANEOUS | Status: AC

## 2020-10-18 MED ORDER — GABAPENTIN 300 MG PO CAPS: 300.0000 mg | ORAL_CAPSULE | Freq: Every day | ORAL | Status: AC

## 2020-10-18 NOTE — Discharge Summary (Signed)
Physician Discharge Summary  Rick Cruz NLZ:[id number] DOB: 11/01/1945 DOA: 10/11/2020  PCP: Rick Moment, NP (Inactive)  Admit date: 10/11/2020 Discharge date: 10/18/2020  Admitted From: home Discharge disposition: SNF   Recommendations for Outpatient Follow-Up:   1. DYS 2- thin liquids-- would encourage SLP to follow and advance diet 2. Monitor volume status and resume lasix as able 3. Monitor blood sugars   Discharge Diagnosis:   Principal Problem:   Ischemic cerebrovascular accident (CVA) ([place]) Active Problems:   Obstructive sleep apnea   Essential hypertension   Paranoid schizophrenia ([place])   Type 2 diabetes mellitus without complication, with long-term current use of insulin (HCC)   Chronic respiratory failure 02 dep with hypercarbia   Morbid obesity (HCC)   Chronic diastolic CHF (congestive heart failure) (HCC)   Paroxysmal atrial fibrillation (HCC)   Depression   Diabetic neuropathy (HCC)   Dyslipidemia   Gastroesophageal reflux disease   Class 1 obesity   CVA (cerebral vascular accident) ([place])    Discharge Condition: Improved.  Wound care: None.  Code status: Full.   History of Present Illness:   Rick Cruz is a 75 y.o. male with medical history significant of osteoarthritis, history of other nonhemorrhagic stroke, right eye blindness, chronic diastolic heart failure, chronic respiratory failure due to COPD on home oxygen, sleep apnea noncompliant with BiPAP, history of tobacco abuse, type II DM, hyperlipidemia, hypertension, history of unspecified schizophrenia who is coming to the emergency department due to right lower extremity weakness for the past 2 weeks.  He normally uses a walker, but has had multiple falls in the past 2 weeks.  He states that his legs feel like like is giving out.  He denies head trauma or LOC.  He denies numbness or tingling on his left side, blurred vision or slurred speech or difficulty understanding.  Fever,  chills, night sweats, rhinorrhea, sore throat, chest pain, palpitations, diaphoresis, PND, orthopnea or recent pitting edema of the lower extremities.  Denies abdominal pain, nausea, vomiting, diarrhea, constipation, melena or hematochezia.  No dysuria, frequency or hematuria.  No polyuria, polydipsia or polyphagia.   Hospital Course by Problem:   Fever causing AMS/delirium -due to UTI -resolved  Ischemic cerebrovascular accident (CVA) : MRI: acute right pontine infarct and 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  - neurochecks. -passed Swallow screen in ER. -PT/OT/SLP.- home health -LDL: 88 (on statin) -hemoglobin A1c: 6.7 (goal <7) -carotid Doppler: right: Peak velocity suggests 50%-69% stenosis, with the ICA/ CCA ratio suggesting a lesser degree of stenosis. If establishing a more accurate degree of stenosis is required, cerebral angiogram should be considered, or as a second best test, CTA. --echocardiogram: Left ventricular ejection fraction, by estimation, is 55 to 60%. The  left ventricle has normal function. The left ventricle has no regional  wall motion abnormalities. There is moderate left ventricular hypertrophy.  Left ventricular diastolic parameters are indeterminate. -Discussed with neurology- additional ASA 81 mg x 7 days on top of eliquis-- finished course already so continue with just eliquis  Essential hypertension -resume BB at a lower dose  Type 2 diabetes mellitus without complication, with long-term current use of insulin (HCC) Carbohydrate modified diet. Continue Lantus 20 units at bedtime. -SSI -hemoglobin A1c <7  Chronic respiratory failure  with hypercarbia Continue supplemental oxygen- goal sat of 88-92% Continue Trelegy Ellipta daily. Continuous Spiriva daily. Rescue MDI or nebs as needed.  Hyponatremia -resolved  Chronic diastolic CHF (congestive heart failure) (HCC) No signs of  decompensation. -resume lasix as needed  for volume overload  Paroxysmal atrial fibrillation (HCC) [place] VASc Scoreof at least 5. -resume eliquis  Paranoid schizophrenia (HCC) -seroquel  Depression Continue duloxetine 30 mg p.o. daily.  Diabetic neuropathy (HCC) Continue gabapentin 300 mg p.o. bedtime.  Dyslipidemia On simvastatin- not at goal -LDL: 88 -goal <70  Obstructive sleep apnea -does not wear CPAP- encourage to be compliant  obesity Body mass index is 32.14 kg/m.    Medical Consultants:   neurology   Discharge Exam:   Vitals:   10/18/20 0447 10/18/20 0851  BP: (!) 147/75   Pulse: 62   Resp: 20   Temp: 98.5 F (36.9 C)   SpO2: 98% 95%   Vitals:   10/17/20 1453 10/17/20 2104 10/18/20 0447 10/18/20 0851  BP: 128/70 120/64 (!) 147/75   Pulse: 94 97 62   Resp:  20 20   Temp: 98.1 F (36.7 C) 99.5 F (37.5 C) 98.5 F (36.9 C)   TempSrc: Oral Axillary Axillary   SpO2: 95% 93% 98% 95%  Weight:      Height:        General exam: Appears calm and comfortable.   The results of significant diagnostics from this hospitalization (including imaging, microbiology, ancillary and laboratory) are listed below for reference.     Procedures and Diagnostic Studies:   DG Pelvis 1-2 Views  Result Date: 10/11/2020 CLINICAL DATA:  Left hip soreness.  History of falls. EXAM: PELVIS - 1-2 VIEW COMPARISON:  Abdomen 05/23/2016.  Pelvis 12/05/2014. FINDINGS: Stable deformity right ilium. Degenerative changes lumbar spine and both hips. No acute bony or joint abnormality identified. Aortoiliac atherosclerotic vascular calcification. Pelvic calcifications consistent phleboliths. Ill-defined density noted over the lower pelvis/perineum, most likely external to the patient, possibly a bandage. IMPRESSION: 1. Degenerative changes lumbar spine and both hips. No acute bony abnormality identified. 2. Aortoiliac atherosclerotic vascular disease. Electronically Signed   By: Maisie Fus  Register   On:  10/11/2020 13:39   CT Head Wo Contrast  Result Date: 10/11/2020 CLINICAL DATA:  Left leg weakness EXAM: CT HEAD WITHOUT CONTRAST TECHNIQUE: Contiguous axial images were obtained from the base of the skull through the vertex without intravenous contrast. COMPARISON:  CT brain 02/18/2020, brain MRI 04/27/2019 FINDINGS: Brain: No acute territorial infarction, hemorrhage, or intracranial mass. Moderate atrophy. Moderate hypodensity in the white matter consistent with chronic small vessel ischemic change. Possible age indeterminate lacunar infarct in the right thalamus. Stable ventricle size. Vascular: No hyperdense vessels.  Carotid vascular calcification Skull: Normal. Negative for fracture or focal lesion. Sinuses/Orbits: No acute finding. Other: None IMPRESSION: 1. Possible age indeterminate lacunar infarct in the right thalamus. Otherwise no CT evidence for acute intracranial abnormality. 2. Atrophy and chronic small vessel ischemic changes of the white matter. Electronically Signed   By: Jasmine Pang M.D.   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 Electronically Signed   By: Jackey Loge DO   On: 10/11/2020 17:38   Result Date: 10/11/2020 CLINICAL DATA:  Stroke, follow-up. Additional history provided: Fatigue, bilateral weakness for 2 days, no known injury. EXAM: MRI HEAD WITHOUT CONTRAST MRA HEAD WITHOUT CONTRAST TECHNIQUE: Multiplanar, multiecho pulse sequences of the brain and surrounding structures were  obtained without intravenous contrast. Angiographic images of the head were obtained using MRA technique without contrast. COMPARISON:  Head CT 10/11/2020. brain MRI 04/27/2019. FINDINGS: MRI HEAD  FINDINGS Brain: Multiple sequences are significantly motion degraded, limiting evaluation. Most notably, there is moderate motion degradation of the sagittal T1 weighted sequence, mild/moderate motion degradation of the axial T2 weighted sequence, severe motion degradation of the axial T2* sequence, severe motion degradation of the axial T1 weighted sequence, moderate motion degradation of the coronal T2 weighted sequence and moderate motion degradation of the axial T2/FLAIR sequence. Moderate cerebral atrophy.  Comparatively mild cerebellar atrophy. 7 mm acute infarct within the right pons (series 5, image 11) (series 7, image 17). Chronic right thalamic lacunar infarct. Moderately severe multifocal T2/FLAIR hyperintensity within the cerebral white matter is nonspecific, but compatible with chronic small vessel ischemic disease. No evidence of intracranial mass. No chronic intracranial blood products. No extra-axial fluid collection. No midline shift. Vascular: Reported below. Skull and upper cervical spine: No focal marrow lesion. Sinuses/Orbits: Visualized orbits show no acute finding. No significant paranasal sinus disease. Other: Trace fluid within the left mastoid air cells. MRA HEAD FINDINGS Mild to moderately motion degraded exam. The intracranial internal carotid arteries are patent. Apparent high-grade stenosis within the proximal cavernous left ICA, which could be exaggerated by motion artifact on the current exam. The M1 middle cerebral arteries are patent. Apparent moderate stenosis within the distal M1 left middle cerebral artery. Additionally, there are apparent high-grade stenoses within bilateral proximal to mid M2 MCA branches. The A1 left ACA is developmentally hypoplastic or absent. The anterior cerebral arteries are otherwise patent. Apparent moderate stenosis within the proximal A2 left ACA. 1-2 mm inferiorly projecting vascular protrusion arising from the paraclinoid right ICA which may  reflect an aneurysm or infundibulum (series 1045, image 15). There is a marked paucity of flow related signal arising from the intracranial right vertebral artery and this vessel is likely occluded. The left vertebral artery is patent without significant stenosis. The basilar artery is patent with mild atherosclerotic irregularity. The posterior cerebral arteries are patent. Apparent multifocal severe stenoses within the P2 right posterior cerebral artery. Apparent multifocal. Apparent moderate focal stenosis within the proximal P2 left PCA. 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 A left posterior communicating artery is present. The right posterior communicating artery is hypoplastic or absent. IMPRESSION: MRI brain: 1. Significantly motion degraded exam. 2. 7 mm acute infarct within the right pons. 3. Chronic right thalamic lacunar infarct. 4. Background moderate cerebral atrophy and moderately severe cerebral white matter chronic small vessel ischemic disease. MRA head: 1. Mild to moderately motion degraded exam. 2. Paucity of flow-related signal arising from the intracranial right vertebral artery. This vessel is likely occluded. 3. The basilar artery is patent with mild atherosclerotic irregularity. 4. Additional apparent intracranial stenoses as follows (please note these apparent stenoses may be exaggerated by motion artifact on the current exam). 5. Apparent high-grade stenosis within the proximal cavernous left ICA. 6. Apparent moderate stenosis within the distal M1 left MCA. 7. Apparent high-grade stenoses within bilateral proximal to mid M2 MCA branches. 8. Apparent moderate stenosis with proximal A2 left ACA. 9. Apparent multifocal severe stenoses within the P2 right PCA. 10. Apparent moderate focal stenosis within the proximal P2 left PCA. 11. 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 Electronically Signed   By: Kellie Simmering DO   On: 10/11/2020 17:38   Result Date: 10/11/2020 CLINICAL DATA:  Stroke, follow-up. Additional history provided: Fatigue, bilateral weakness for 2 days, no known injury. EXAM: MRI HEAD WITHOUT CONTRAST MRA HEAD WITHOUT CONTRAST TECHNIQUE: Multiplanar, multiecho pulse sequences of the brain and surrounding structures were obtained without intravenous contrast. Angiographic images of the head were obtained using MRA technique without contrast. COMPARISON:  Head CT 10/11/2020. brain MRI 04/27/2019. FINDINGS: MRI HEAD FINDINGS Brain: Multiple sequences are significantly motion degraded, limiting evaluation. Most notably, there is moderate motion degradation of the sagittal T1 weighted sequence, mild/moderate motion degradation of the axial T2 weighted sequence, severe motion degradation of the axial T2* sequence, severe motion degradation of the axial T1 weighted sequence, moderate motion degradation of the coronal T2 weighted sequence and moderate motion degradation of the axial T2/FLAIR sequence. Moderate cerebral atrophy.  Comparatively mild cerebellar atrophy. 7 mm acute infarct within the right pons (series 5, image 11) (series 7, image 17). Chronic right thalamic lacunar infarct. Moderately severe multifocal T2/FLAIR hyperintensity within the cerebral white matter is nonspecific, but compatible with chronic small vessel ischemic disease. No evidence of intracranial mass. No chronic intracranial blood products. No extra-axial fluid collection. No midline shift. Vascular: Reported below. Skull and upper cervical spine: No focal marrow lesion.  Sinuses/Orbits: Visualized orbits show no acute finding. No significant paranasal sinus disease. Other: Trace fluid within the left mastoid air cells. MRA HEAD FINDINGS Mild to moderately motion degraded exam. The intracranial internal carotid arteries are patent. Apparent high-grade stenosis within the proximal cavernous left ICA, which could be exaggerated by motion artifact on the current exam. The M1 middle cerebral arteries are patent. Apparent moderate stenosis within the distal M1 left middle cerebral artery. Additionally, there are apparent high-grade stenoses within bilateral proximal to mid M2 MCA branches. The A1 left ACA is developmentally hypoplastic or absent. The anterior cerebral arteries are otherwise patent. Apparent moderate stenosis within the proximal A2 left ACA. 1-2 mm inferiorly projecting vascular protrusion arising from the paraclinoid right ICA which may reflect an aneurysm or infundibulum (series 1045, image 15). There is a marked paucity of flow related signal arising from the intracranial right vertebral artery and this vessel is likely occluded. The left vertebral artery is patent without significant stenosis. The basilar artery is patent with mild atherosclerotic irregularity. The posterior cerebral arteries are patent. Apparent multifocal severe stenoses within the P2 right posterior cerebral artery. Apparent multifocal. Apparent moderate focal stenosis within the proximal P2 left PCA. 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 A left posterior communicating artery is present. The right posterior communicating artery is hypoplastic or absent. IMPRESSION: MRI brain: 1. Significantly motion degraded exam. 2. 7 mm acute infarct within the right pons. 3. Chronic right thalamic lacunar infarct. 4. Background moderate cerebral atrophy and moderately severe cerebral white matter chronic small vessel ischemic disease. MRA head: 1. Mild to moderately motion degraded  exam. 2. Paucity of flow-related signal arising from the intracranial right vertebral artery. This vessel is likely occluded. 3. The basilar artery is patent with mild atherosclerotic irregularity. 4. Additional apparent intracranial stenoses as follows (please note these apparent stenoses may be exaggerated by motion artifact on the  current exam). 5. Apparent high-grade stenosis within the proximal cavernous left ICA. 6. Apparent moderate stenosis within the distal M1 left MCA. 7. Apparent high-grade stenoses within bilateral proximal to mid M2 MCA branches. 8. Apparent moderate stenosis with proximal A2 left ACA. 9. Apparent multifocal severe stenoses within the P2 right PCA. 10. Apparent moderate focal stenosis within the proximal P2 left PCA. 11. 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 Electronically Signed: By: Kellie Simmering DO On: 10/11/2020 17:35   US Carotid Bilateral (at [facility] and [facility] only)  Result Date: 10/12/2020 CLINICAL DATA:  75 year old male with a history of stroke EXAM: BILATERAL CAROTID DUPLEX ULTRASOUND TECHNIQUE: Pearline Cables scale imaging, color Doppler and duplex ultrasound were performed of bilateral carotid and vertebral arteries in the neck. COMPARISON:  None. FINDINGS: Criteria: Quantification of carotid stenosis is based on velocity parameters that correlate the residual internal carotid diameter with NASCET-based stenosis levels, using the diameter of the distal internal carotid lumen as the denominator for stenosis measurement. The following velocity measurements were obtained: RIGHT ICA:  Systolic 128 cm/sec, Diastolic 27 cm/sec CCA:  75 cm/sec SYSTOLIC ICA/CCA RATIO:  1.9 ECA:  153 cm/sec LEFT ICA:  Systolic 51 cm/sec, Diastolic 12 cm/sec CCA:  75 cm/sec SYSTOLIC ICA/CCA RATIO:  0.7 ECA:  117 cm/sec Right Brachial SBP: Not acquired Left Brachial SBP: Not acquired RIGHT CAROTID ARTERY: No significant calcified disease of the right common carotid artery. Intermediate  waveform maintained. Heterogeneous plaque without significant calcifications at the right carotid bifurcation. Low resistance waveform of the right ICA. No significant tortuosity. RIGHT VERTEBRAL ARTERY: Antegrade flow with low resistance waveform. LEFT CAROTID ARTERY: No significant calcified disease of the left common carotid artery. Intermediate waveform maintained. Heterogeneous plaque at the left carotid bifurcation without significant calcifications. Low resistance waveform of the left ICA. LEFT VERTEBRAL ARTERY:  Antegrade flow with low resistance waveform. IMPRESSION: Right: Heterogeneous plaque at the right carotid bifurcation, with discordant results regarding degree of stenosis by established duplex criteria. 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 If establishing a more accurate degree of stenosis is required, cerebral angiogram should be considered, or as a second best test, CTA. Left: Color duplex indicates moderate heterogeneous plaque with no hemodynamically significant stenosis by duplex criteria in the left extracranial cerebrovascular circulation. Signed, Dulcy Fanny. Dellia Nims, RPVI [facility] Electronically Signed   By: Corrie Mckusick D.O.   On: 10/12/2020 11:11   DG Knee Complete 4 Views Left  Result Date: 10/11/2020 CLINICAL DATA:  Pain.  Recent falls EXAM: LEFT KNEE - COMPLETE 4+ VIEW COMPARISON:  None FINDINGS: Frontal, lateral, and bilateral oblique views were obtained. No fracture or dislocation. No joint effusion. There is mild narrowing of the patellofemoral joint. Other joint spaces appear unremarkable. There is chondrocalcinosis. No erosion. IMPRESSION: Chondrocalcinosis, a finding that may be seen with osteoarthritis or calcium pyrophosphate deposition disease. Mild narrowing patellofemoral joint. Other joint spaces appear unremarkable. No evident fracture,  dislocation, or joint effusion. Electronically Signed   By: Lowella Grip III M.D.   On: 10/11/2020 13:40   ECHOCARDIOGRAM COMPLETE  Result Date: 10/12/2020    ECHOCARDIOGRAM REPORT   Patient Name:   SHAI MCKENZIE Date of Exam: 10/12/2020 Medical Rec #:  [id number]      Height:       72.0 in Accession #:    [id number]     Weight:  237.0 lb Date of Birth:  [birth year]      BSA:          2.290 m Patient Age:    67 years       BP:           169/81 mmHg Patient Gender: M              HR:           93 bpm. Exam Location:  [name] Procedure: 2D Echo, Cardiac Doppler and Color Doppler Indications:    CVA  History:        Patient has prior history of Echocardiogram examinations, most                 recent 05/26/2016. CHF, Stroke and COPD; Risk Factors:Current                 Smoker, Hypertension, Dyslipidemia, Diabetes, Sleep Apnea and                 Obesity.  Sonographer:    Dustin Flock RDCS Referring Phys: [id number] [place]  1. Left ventricular ejection fraction, by estimation, is 55 to 60%. The left ventricle has normal function. The left ventricle has no regional wall motion abnormalities. There is moderate left ventricular hypertrophy. Left ventricular diastolic parameters are indeterminate.  2. Right ventricular systolic function is normal. The right ventricular size is normal. There is normal pulmonary artery systolic pressure. The estimated right ventricular systolic pressure is 73.4 mmHg.  3. The mitral valve is grossly normal. Trivial mitral valve regurgitation.  4. The aortic valve has an indeterminant number of cusps. Aortic valve regurgitation is not visualized. Mild to moderate aortic valve sclerosis/calcification is present, without any evidence of aortic stenosis.  5. The inferior vena cava is normal in size with greater than 50% respiratory variability, suggesting right atrial pressure of 3 mmHg. FINDINGS  Left Ventricle: Left ventricular ejection fraction, by  estimation, is 55 to 60%. The left ventricle has normal function. The left ventricle has no regional wall motion abnormalities. The left ventricular internal cavity size was normal in size. There is  moderate left ventricular hypertrophy. Left ventricular diastolic parameters are indeterminate. Right Ventricle: The right ventricular size is normal. No increase in right ventricular wall thickness. Right ventricular systolic function is normal. There is normal pulmonary artery systolic pressure. The tricuspid regurgitant velocity is 2.25 m/s, and  with an assumed right atrial pressure of 3 mmHg, the estimated right ventricular systolic pressure is 28.7 mmHg. Left Atrium: Left atrial size was normal in size. Right Atrium: Right atrial size was normal in size. Pericardium: There is no evidence of pericardial effusion. Mitral Valve: The mitral valve is grossly normal. Mild mitral annular calcification. Trivial mitral valve regurgitation. Tricuspid Valve: The tricuspid valve is grossly normal. Tricuspid valve regurgitation is trivial. Aortic Valve: The aortic valve has an indeterminant number of cusps. There is mild to moderate aortic valve annular calcification. Aortic valve regurgitation is not visualized. Mild to moderate aortic valve sclerosis/calcification is present, without any  evidence of aortic stenosis. Pulmonic Valve: The pulmonic valve was grossly normal. Pulmonic valve regurgitation is trivial. Aorta: The aortic root is normal in size and structure. Venous: The inferior vena cava is normal in size with greater than 50% respiratory variability, suggesting right atrial pressure of 3 mmHg. IAS/Shunts: No atrial level shunt detected by color flow Doppler.  LEFT VENTRICLE PLAX 2D LVIDd:  5.00 cm  Diastology LVIDs:         3.20 cm  LV e' medial:    6.09 cm/s LV PW:         1.50 cm  LV E/e' medial:  10.7 LV IVS:        1.40 cm  LV e' lateral:   9.46 cm/s LVOT diam:     2.20 cm  LV E/e' lateral: 6.9 LV SV:          86 LV SV Index:   38 LVOT Area:     3.80 cm  RIGHT VENTRICLE RV Basal diam:  3.10 cm TAPSE (M-mode): 2.5 cm LEFT ATRIUM             Index       RIGHT ATRIUM           Index LA diam:        3.40 cm 1.48 cm/m  RA Area:     14.90 cm LA Vol (A2C):   60.1 ml 26.25 ml/m RA Volume:   39.90 ml  17.42 ml/m LA Vol (A4C):   73.4 ml 32.05 ml/m LA Biplane Vol: 70.7 ml 30.87 ml/m  AORTIC VALVE LVOT Vmax:   109.00 cm/s LVOT Vmean:  71.300 cm/s LVOT VTI:    0.226 m  AORTA Ao Root diam: 3.70 cm MITRAL VALVE               TRICUSPID VALVE MV Area (PHT): 2.78 cm    TR Peak grad:   20.2 mmHg MV Decel Time: 273 msec    TR Vmax:        225.00 cm/s MV E velocity: 65.00 cm/s MV A velocity: 97.50 cm/s  SHUNTS MV E/A ratio:  0.67        Systemic VTI:  0.23 m                            Systemic Diam: 2.20 cm Rozann Lesches MD Electronically signed by Rozann Lesches MD Signature Date/Time: 10/12/2020/3:40:21 PM    Final    DG Femur Min 2 Views Left  Result Date: 10/11/2020 CLINICAL DATA:  Pain following recent fall EXAM: LEFT FEMUR 2 VIEWS COMPARISON:  None. FINDINGS: Frontal and lateral views were obtained. There is no fracture or dislocation. No abnormal periosteal reaction. There is chondrocalcinosis in the knee region. There is slight narrowing of the left hip joint. There are scattered foci of arterial vascular calcification. IMPRESSION: No fracture or dislocation. Mild narrowing left hip joint. Chondrocalcinosis noted in the left knee joint. Scattered foci of arterial vascular calcification. Electronically Signed   By: Lowella Grip III M.D.   On: 10/11/2020 13:39     Labs:   Basic Metabolic Panel: Recent Labs  Lab 10/13/20 0648 10/15/20 0648 10/16/20 1104 10/17/20 0454 10/18/20 0520  NA 130* 129* 131* 137 134*  K 4.3 4.6 4.3 4.0 4.1  CL 92* 93* 100 102 101  CO2 31 25 20* 27 27  GLUCOSE 122* 175* 174* 136* 130*  BUN $Re'9 15 18 17 15  'CUQ$ CREATININE 0.85 1.12 1.08 0.95 0.87  CALCIUM 9.1 9.4 9.0 9.1 8.7*    GFR Estimated Creatinine Clearance: 95.8 mL/min (by C-G formula based on SCr of 0.87 mg/dL). Liver Function Tests: Recent Labs  Lab 10/11/20 1545  AST 22  ALT 19  ALKPHOS 82  BILITOT 0.4  PROT 6.7  ALBUMIN 3.4*   No results for input(s): LIPASE, AMYLASE  in the last 168 hours. No results for input(s): AMMONIA in the last 168 hours. Coagulation profile Recent Labs  Lab 10/11/20 2028  INR 1.3*    CBC: Recent Labs  Lab 10/11/20 1545 10/12/20 0458 10/13/20 0648 10/15/20 0648 10/17/20 0454 10/18/20 0520  WBC 11.5* 10.4 10.6* 13.6* 9.0 8.5  NEUTROABS 8.2*  --   --   --   --   --   HGB 11.1* 12.1* 10.8* 12.1* 11.3* 10.1*  HCT 38.8* 41.8 35.9* 39.0 37.7* 33.7*  MCV 84.0 85.0 80.9 79.1* 81.1 81.2  PLT 249 249 234 282 258 230   Cardiac Enzymes: No results for input(s): CKTOTAL, CKMB, CKMBINDEX, TROPONINI in the last 168 hours. BNP: Invalid input(s): POCBNP CBG: Recent Labs  Lab 10/17/20 1109 10/17/20 1607 10/17/20 2059 10/18/20 0830 10/18/20 1132  GLUCAP 186* 180* 245* 151* 159*   D-Dimer No results for input(s): DDIMER in the last 72 hours. Hgb A1c No results for input(s): HGBA1C in the last 72 hours. Lipid Profile No results for input(s): CHOL, HDL, LDLCALC, TRIG, CHOLHDL, LDLDIRECT in the last 72 hours. Thyroid function studies No results for input(s): TSH, T4TOTAL, T3FREE, THYROIDAB in the last 72 hours.  Invalid input(s): FREET3 Anemia work up No results for input(s): VITAMINB12, FOLATE, FERRITIN, TIBC, IRON, RETICCTPCT in the last 72 hours. Microbiology Recent Results (from the past 240 hour(s))  SARS CORONAVIRUS 2 (TAT 6-24 HRS) Nasopharyngeal Nasopharyngeal Swab     Status: None   Collection Time: 10/11/20  6:05 PM   Specimen: Nasopharyngeal Swab  Result Value Ref Range Status   SARS Coronavirus 2 NEGATIVE NEGATIVE Final    Comment: (NOTE) SARS-CoV-2 target nucleic acids are NOT DETECTED.  The SARS-CoV-2 RNA is generally detectable in upper  and lower respiratory specimens during the acute phase of infection. Negative results do not preclude SARS-CoV-2 infection, do not rule out co-infections with other pathogens, and should not be used as the sole basis for treatment or other patient management decisions. Negative results must be combined with clinical observations, patient history, and epidemiological information. The expected result is Negative.  Fact Sheet for Patients: SugarRoll.be  Fact Sheet for Healthcare Providers: https://www.woods-mathews.com/  This test is not yet approved or cleared by the [place] FDA and  has been authorized for detection and/or diagnosis of SARS-CoV-2 by FDA under an Emergency Use Authorization (EUA). This EUA will remain  in effect (meaning this test can be used) for the duration of the COVID-19 declaration under Se ction 564(b)(1) of the Act, 21 U.S.C. section 360bbb-3(b)(1), unless the authorization is terminated or revoked sooner.  Performed at [facility], [place] [address] [place], [place] [address]   Culture, blood (Routine X 2) w Reflex to ID Panel     Status: None (Preliminary result)   Collection Time: 10/14/20 10:14 AM   Specimen: BLOOD  Result Value Ref Range Status   Specimen Description BLOOD LEFT ANTECUBITAL  Final   Special Requests   Final    BOTTLES DRAWN AEROBIC AND ANAEROBIC Blood Culture adequate volume   Culture   Final    NO GROWTH 4 DAYS Performed at [facility], [address] [place], [place] [address]    Report Status PENDING  Incomplete  Culture, Urine     Status: Abnormal   Collection Time: 10/14/20 10:55 AM   Specimen: Urine, Random  Result Value Ref Range Status   Specimen Description   Final    URINE, RANDOM Performed at [facility], [address] [place], [place] [address]    Special Requests   Final    NONE Performed at [facility], [address] [place], [place] [address]    Culture  MULTIPLE SPECIES PRESENT, SUGGEST RECOLLECTION (A)  Final   Report Status 10/16/2020 FINAL  Final     Discharge Instructions:   Discharge Instructions    Discharge instructions   Complete by: As directed    DYS 2 thin Monitor volume status and resume lasix if needed   Increase activity slowly   Complete by: As directed      Allergies as of 10/18/2020      Reactions   Phenergan [promethazine Hcl] Other (See Comments)   Becomes very confused and aggressive   Haldol [haloperidol Lactate] Other (See Comments)   Shaking    Metformin And Related Diarrhea      Medication List    STOP taking these medications   furosemide 20 MG tablet Commonly known as: LASIX   insulin lispro 100 UNIT/ML injection Commonly known as: HumaLOG   meclizine 25 MG tablet Commonly known as: ANTIVERT   polyethylene glycol 17 g packet Commonly known as: MIRALAX / GLYCOLAX   predniSONE 20 MG tablet Commonly known as: Deltasone   saw palmetto 160 MG capsule   traMADol 50 MG tablet Commonly known as: ULTRAM     TAKE these medications   acetaminophen 325 MG tablet Commonly known as: TYLENOL Take 2 tablets (650 mg total) by mouth every 6 (six) hours as needed for mild pain, fever or headache (or Fever >/= 101).   albuterol (2.5 MG/3ML) 0.083% nebulizer solution Commonly known as: PROVENTIL Take 3 mLs (2.5 mg total) by nebulization every 6 (six) hours as needed for wheezing or shortness of breath.   apixaban 5 MG Tabs tablet Commonly known as: ELIQUIS Take 1 tablet (5 mg total) by mouth 2 (two) times daily.   blood glucose meter kit and supplies Relion Prime or Dispense other brand based on patient and insurance preference. Use up to four times daily as directed. (FOR ICD-9 250.00, 250.01).   blood glucose meter kit and supplies Dispense based on patient and insurance preference. Use up to four times daily as directed. (FOR ICD-9 250.00, 250.01).  --Otherwise may dispense ReliOn Prime  glucometer, test strips and lancets   carboxymethylcellulose 0.5 % Soln Commonly known as: REFRESH PLUS Apply 1 drop to eye 3 (three) times daily as needed.   CENTRUM SILVER 50+MEN PO Take 1 tablet by mouth daily.   PRESERVISION AREDS 2+MULTI VIT PO Take 2 tablets by mouth in the morning and at bedtime.   cholecalciferol 1000 units tablet Commonly known as: VITAMIN D Take 1,000 Units by mouth 2 (two) times daily.   Cinnamon 500 MG capsule Take 1,000 mg by mouth in the morning and at bedtime.   docusate sodium 100 MG capsule Commonly known as: COLACE Take 100 mg by mouth 2 (two) times daily.   DULoxetine 30 MG capsule Commonly known as: CYMBALTA Take 1 capsule (30 mg total) by mouth every morning.   gabapentin 300 MG capsule Commonly known as: NEURONTIN Take 1 capsule (300 mg total) by mouth at bedtime. What changed: when to take this   hydrOXYzine 25 MG tablet Commonly known as: ATARAX/VISTARIL Take 1 tablet (25 mg total) by mouth every 12 (twelve) hours as needed for anxiety or itching (Anxiety and insomnia or restlessness).   insulin aspart 100 UNIT/ML injection Commonly known as: novoLOG Inject 0-15 Units into the skin  3 (three) times daily with meals.   Lantus SoloStar 100 UNIT/ML Solostar Pen Generic drug: insulin glargine Inject 20 Units into the skin at bedtime.   melatonin 3 MG Tabs tablet Take 6 mg by mouth at bedtime.   metoprolol tartrate 25 MG tablet Commonly known as: LOPRESSOR Take 1 tablet (25 mg total) by mouth 2 (two) times daily. What changed:   medication strength  how much to take   OXYGEN Inhale 3 L into the lungs continuous.   pantoprazole 40 MG tablet Commonly known as: PROTONIX Take 1 tablet (40 mg total) by mouth 2 (two) times daily before a meal.   QUEtiapine 25 MG tablet Commonly known as: SEROQUEL Take 1 tablet (25 mg total) by mouth in the morning.   QUEtiapine 50 MG tablet Commonly known as: SEROQUEL Take 1 tablet (50  mg total) by mouth at bedtime.   simvastatin 40 MG tablet Commonly known as: ZOCOR Take 40 mg by mouth every evening.   tiotropium 18 MCG inhalation capsule Commonly known as: SPIRIVA Place 1 capsule (18 mcg total) into inhaler and inhale daily.   Trelegy Ellipta 100-62.5-25 MCG/INH Aepb Generic drug: Fluticasone-Umeclidin-Vilant Inhale 1 puff into the lungs daily.   triamcinolone 0.1 % Commonly known as: KENALOG Apply 1 application topically 2 (two) times daily. What changed: Another medication with the same name was removed. Continue taking this medication, and follow the directions you see here.   Trulicity 3 RR/1.1AF Sopn Generic drug: Dulaglutide Inject 3 mg into the skin every Friday.       Follow-up Information    Rick Moment, NP Follow up in 1 week(s).   Specialty: Internal Medicine Contact information: [address] [place] [address] [phone]                Time coordinating discharge: 35 min  Signed:  Geradine Girt DO  [facility] 10/18/2020, 11:40 AM

## 2020-10-18 NOTE — Progress Notes (Signed)
Occupational Therapy Treatment Patient Details Name: Rick Cruz MRN: [id number] DOB: [birth year] Today's Date: 10/18/2020    History of present illness Rick Cruz is a 75 y.o. male with medical history significant of osteoarthritis, history of other nonhemorrhagic stroke, right eye blindness, chronic diastolic heart failure, chronic respiratory failure due to COPD on home oxygen, sleep apnea noncompliant with BiPAP, history of tobacco abuse, type II DM, hyperlipidemia, hypertension, history of unspecified schizophrenia who is coming to the emergency department due to right lower extremity weakness for the past 2 weeks.  He normally uses a walker, but has had multiple falls in the past 2 weeks.  He states that his legs feel like like is giving out.  He denies head trauma or LOC.  He denies numbness or tingling on his left side, blurred vision or slurred speech or difficulty understanding.  Fever, chills, night sweats, rhinorrhea, sore throat, chest pain, palpitations, diaphoresis, PND, orthopnea or recent pitting edema of the lower extremities.  Denies abdominal pain, nausea, vomiting, diarrhea, constipation, melena or hematochezia.  No dysuria, frequency or hematuria.  No polyuria, polydipsia or polyphagia.   OT comments  Pt alert and oriented x3 this am, agreeable to OT session. Pt performing bed mobility with min assist for LLE mobility to EOB and back into bed. Pt performing simple morning ADLs while seated at EOB, unable to performing standing due to LLE weakness and ataxia. Using LUE during ADLs without difficulty. Update discharge recommendation to SNF to improve safety and independence in ADL participation and mobility.    Follow Up Recommendations  SNF    Equipment Recommendations  None recommended by OT       Precautions / Restrictions Precautions Precautions: Fall Restrictions Weight Bearing Restrictions: No       Mobility Bed Mobility Overal bed mobility: Needs  Assistance Bed Mobility: Supine to Sit;Sit to Supine     Supine to sit: Min guard Sit to supine: Min assist   General bed mobility comments: min assist for LLE mobility  Transfers Overall transfer level: Needs assistance Equipment used: Rolling walker (2 wheeled) Transfers: Lateral/Scoot Transfers          Lateral/Scoot Transfers: Min guard General transfer comment: Pt attempt sit to stand, unable to fully stand due to LLE weakness and buckling. Performing lateral scoots to [name] with OT blocking LLE at knee        ADL either performed or assessed with clinical judgement   ADL Overall ADL's : Needs assistance/impaired     Grooming: Brushing hair;Set up;Sitting Grooming Details (indicate cue type and reason): pt seated at EOB for grooming tasks         Upper Body Dressing : Minimal assistance;Sitting Upper Body Dressing Details (indicate cue type and reason): OT assisting with gown adjustments and buttons/ties                   General ADL Comments: Pt completing ADLs in sitting due to LLE weakness and buckling on attempts to stand     Vision   Vision Assessment?: No apparent visual deficits          Cognition Arousal/Alertness: Awake/alert Behavior During Therapy: WFL for tasks assessed/performed Overall Cognitive Status: Within Functional Limits for tasks assessed  Pertinent Vitals/ Pain       Pain Assessment: No/denies pain         Frequency  Min 2X/week        Progress Toward Goals  OT Goals(current goals can now be found in the care plan section)  Progress towards OT goals: Progressing toward goals  Acute Rehab OT Goals Patient Stated Goal: Get stronger in left leg. OT Goal Formulation: With patient Time For Goal Achievement: 10/27/20 Potential to Achieve Goals: Good ADL Goals Pt Will Perform Eating: with modified independence;sitting Pt Will Perform  Grooming: with modified independence;standing Pt Will Perform Upper Body Dressing: with modified independence;sitting Pt Will Perform Lower Body Dressing: with set-up;sitting/lateral leans Pt Will Transfer to Toilet: stand pivot transfer;with modified independence Pt/caregiver will Perform Home Exercise Program: Increased strength;Both right and left upper extremity;With written HEP provided  Plan Discharge plan needs to be updated          End of Session Equipment Utilized During Treatment: Gait belt;Rolling walker;Oxygen  OT Visit Diagnosis: Unsteadiness on feet (R26.81);Muscle weakness (generalized) (M62.81);History of falling (Z91.81)   Activity Tolerance Patient tolerated treatment well   Patient Left in bed;with call bell/phone within reach;with bed alarm set;with nursing/sitter in room   Nurse Communication Mobility status        Time: [id number] OT Time Calculation (min): 23 min  Charges: OT General Charges $OT Visit: 1 Visit OT Treatments $Self Care/Home Management : 8-22 mins $Therapeutic Exercise: 8-22 mins    Guadelupe Sabin, OTR/L  [phone] 10/18/2020, 9:23 AM

## 2020-10-18 NOTE — TOC Transition Note (Signed)
Transition of Care [facility]) - CM/SW Discharge Note   Patient Details  Name: Rick Cruz MRN: [id number] Date of Birth: 1945/10/18  [facility]) CM/SW Contact:  Salome Arnt, LCSW Phone Number: 10/18/2020, 1:15 PM   Clinical Narrative:  Pt d/c today to SNF. Pt, pt's wife, mother, and son all aware. Son agreeable to transport via [facility]. D/C clinicals sent to facility. Awaiting COVID test prior to transport. RN to call report to SNF and EMS when ready. Per Jackelyn Poling at [place], facility has received auth.      Final next level of care: Skilled Nursing Facility Barriers to Discharge: Barriers Resolved   Patient Goals and CMS Choice Patient states their goals for this hospitalization and ongoing recovery are:: Get [facility] services CMS Medicare.gov Compare Post Acute Care list provided to:: Patient Choice offered to / list presented to : Patient  Discharge Placement   Existing PASRR number confirmed : 10/18/20          Patient chooses bed at: Other - please specify in the comment section below: ([place]) Patient to be transferred to facility by: [facility] Name of family member notified: Wife, mother, son Patient and family notified of of transfer: 10/18/20  Discharge Plan and Services In-house Referral: Clinical Social Work Discharge Planning Services: NA Post Acute Care Choice: Home Health          DME Arranged: N/A DME Agency: NA       HH Arranged: PT [place] Agency: [facility] Date [place]: 10/12/20 Time [place]: [place] Representative spoke with at [place]: [place]) Interventions     Readmission Risk Interventions No flowsheet data found.

## 2020-10-18 NOTE — Care Management Important Message (Signed)
Important Message  Patient Details  Name: Rick Cruz MRN: [id number] Date of Birth: 1946-02-22   Medicare Important Message Given:  Yes - Important Message mailed due to current National Emergency     Tommy Medal 10/18/2020, 12:39 PM

## 2020-10-19 LAB — CULTURE, BLOOD (ROUTINE X 2)
Culture: NO GROWTH
Special Requests: ADEQUATE

## 2020-10-20 DIAGNOSIS — K219 Gastro-esophageal reflux disease without esophagitis: Secondary | ICD-10-CM | POA: Diagnosis not present

## 2020-10-20 DIAGNOSIS — R52 Pain, unspecified: Secondary | ICD-10-CM | POA: Diagnosis not present

## 2020-10-20 DIAGNOSIS — K5900 Constipation, unspecified: Secondary | ICD-10-CM | POA: Diagnosis not present

## 2020-10-20 DIAGNOSIS — F32A Depression, unspecified: Secondary | ICD-10-CM | POA: Diagnosis not present

## 2020-10-20 DIAGNOSIS — I10 Essential (primary) hypertension: Secondary | ICD-10-CM | POA: Diagnosis not present

## 2020-10-20 DIAGNOSIS — E785 Hyperlipidemia, unspecified: Secondary | ICD-10-CM | POA: Diagnosis not present

## 2020-10-20 DIAGNOSIS — G4700 Insomnia, unspecified: Secondary | ICD-10-CM | POA: Diagnosis not present

## 2020-10-20 DIAGNOSIS — E1169 Type 2 diabetes mellitus with other specified complication: Secondary | ICD-10-CM | POA: Diagnosis not present

## 2020-11-01 DIAGNOSIS — G9341 Metabolic encephalopathy: Secondary | ICD-10-CM | POA: Diagnosis not present

## 2020-11-01 DIAGNOSIS — R5381 Other malaise: Secondary | ICD-10-CM | POA: Diagnosis not present

## 2020-11-02 DIAGNOSIS — E1165 Type 2 diabetes mellitus with hyperglycemia: Secondary | ICD-10-CM | POA: Diagnosis not present

## 2020-11-10 ENCOUNTER — Emergency Department (HOSPITAL_COMMUNITY): Payer: Medicare Other

## 2020-11-10 ENCOUNTER — Emergency Department (HOSPITAL_COMMUNITY)
Admission: EM | Admit: 2020-11-10 | Discharge: 2020-11-11 | Disposition: A | Discharge status: Home / Self Care | Payer: Medicare Other | Attending: Emergency Medicine | Admitting: Emergency Medicine

## 2020-11-10 ENCOUNTER — Other Ambulatory Visit: Payer: Self-pay

## 2020-11-10 ENCOUNTER — Encounter (HOSPITAL_COMMUNITY): Payer: Self-pay | Admitting: Emergency Medicine

## 2020-11-10 DIAGNOSIS — I739 Peripheral vascular disease, unspecified: Secondary | ICD-10-CM | POA: Diagnosis not present

## 2020-11-10 DIAGNOSIS — Z794 Long term (current) use of insulin: Secondary | ICD-10-CM | POA: Diagnosis not present

## 2020-11-10 DIAGNOSIS — I5032 Chronic diastolic (congestive) heart failure: Secondary | ICD-10-CM | POA: Diagnosis not present

## 2020-11-10 DIAGNOSIS — S76102A Unspecified injury of left quadriceps muscle, fascia and tendon, initial encounter: Secondary | ICD-10-CM | POA: Insufficient documentation

## 2020-11-10 DIAGNOSIS — Z87891 Personal history of nicotine dependence: Secondary | ICD-10-CM | POA: Diagnosis not present

## 2020-11-10 DIAGNOSIS — E1140 Type 2 diabetes mellitus with diabetic neuropathy, unspecified: Secondary | ICD-10-CM | POA: Diagnosis not present

## 2020-11-10 DIAGNOSIS — J449 Chronic obstructive pulmonary disease, unspecified: Secondary | ICD-10-CM | POA: Diagnosis not present

## 2020-11-10 DIAGNOSIS — S76112A Strain of left quadriceps muscle, fascia and tendon, initial encounter: Secondary | ICD-10-CM

## 2020-11-10 DIAGNOSIS — E1110 Type 2 diabetes mellitus with ketoacidosis without coma: Secondary | ICD-10-CM | POA: Diagnosis not present

## 2020-11-10 DIAGNOSIS — N3000 Acute cystitis without hematuria: Secondary | ICD-10-CM | POA: Diagnosis not present

## 2020-11-10 DIAGNOSIS — M542 Cervicalgia: Secondary | ICD-10-CM | POA: Diagnosis not present

## 2020-11-10 DIAGNOSIS — L97509 Non-pressure chronic ulcer of other part of unspecified foot with unspecified severity: Secondary | ICD-10-CM | POA: Diagnosis not present

## 2020-11-10 DIAGNOSIS — Z7901 Long term (current) use of anticoagulants: Secondary | ICD-10-CM | POA: Diagnosis not present

## 2020-11-10 DIAGNOSIS — E11621 Type 2 diabetes mellitus with foot ulcer: Secondary | ICD-10-CM | POA: Insufficient documentation

## 2020-11-10 DIAGNOSIS — I110 Hypertensive heart disease with heart failure: Secondary | ICD-10-CM | POA: Diagnosis not present

## 2020-11-10 DIAGNOSIS — I10 Essential (primary) hypertension: Secondary | ICD-10-CM | POA: Diagnosis not present

## 2020-11-10 DIAGNOSIS — W19XXXA Unspecified fall, initial encounter: Secondary | ICD-10-CM | POA: Diagnosis not present

## 2020-11-10 DIAGNOSIS — Z7951 Long term (current) use of inhaled steroids: Secondary | ICD-10-CM | POA: Insufficient documentation

## 2020-11-10 DIAGNOSIS — S8992XA Unspecified injury of left lower leg, initial encounter: Secondary | ICD-10-CM | POA: Diagnosis present

## 2020-11-10 DIAGNOSIS — Y92129 Unspecified place in nursing home as the place of occurrence of the external cause: Secondary | ICD-10-CM | POA: Insufficient documentation

## 2020-11-10 DIAGNOSIS — R519 Headache, unspecified: Secondary | ICD-10-CM | POA: Insufficient documentation

## 2020-11-10 DIAGNOSIS — Z79899 Other long term (current) drug therapy: Secondary | ICD-10-CM | POA: Diagnosis not present

## 2020-11-10 DIAGNOSIS — I2510 Atherosclerotic heart disease of native coronary artery without angina pectoris: Secondary | ICD-10-CM | POA: Diagnosis not present

## 2020-11-10 DIAGNOSIS — E1165 Type 2 diabetes mellitus with hyperglycemia: Secondary | ICD-10-CM | POA: Insufficient documentation

## 2020-11-10 DIAGNOSIS — U071 COVID-19: Secondary | ICD-10-CM | POA: Insufficient documentation

## 2020-11-10 NOTE — ED Provider Notes (Signed)
[facility] EMERGENCY DEPARTMENT Provider Note   CSN: [id number] Arrival date & time: 11/10/20  2258     History Chief Complaint  Patient presents with  . Fall    Rick Cruz is a 75 y.o. male.  Patient from facility after unwitnessed fall.  Found lying next to his bed on the left side.  Complains of pain to his left head, neck and left hip and knee.  He does not recall falling and believes he did lose consciousness.  Does take Eliquis.  Complains of pain to his left head and left side of his neck.  Was complaining some back pain earlier but that has since resolved.  Does have pain to his left hip and left knee.  He denies any preceding dizziness or lightheadedness.  No nausea or vomiting.  No chest pain or shortness of breath.  No cough or fever. Reportedly Covid +16 days ago.  History of COPD on home oxygen, sleep apnea, diabetes, diastolic CHF, recent pontine stroke and vertebral artery occlusion.  The history is provided by the patient and the EMS personnel. The history is limited by the condition of the patient.  Fall Associated symptoms include headaches. Pertinent negatives include no chest pain, no abdominal pain and no shortness of breath.       Past Medical History:  Diagnosis Date  . Arthritis   . 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 CHF (congestive heart failure) (HCC)    diastolic   . Chronic pain   . COPD (chronic obstructive pulmonary disease) ([place])   . Depression   . Diabetes mellitus without complication (HCC)    insulin pump  . Hypercholesterolemia   . Hypertension   . On home O2    3 liters  . Oxygen deficiency   . Schizophrenia ([place])   . Sleep apnea    noncompliant with BiPAP  . Sleep apnea   . Tobacco abuse     Patient Active Problem List   Diagnosis Date Noted  . CVA (cerebral vascular accident) ([employer]) 10/12/2020  . Ischemic cerebrovascular accident (CVA) ([place]) 10/11/2020  . Hypoalbuminemia 10/11/2020  . Class 1 obesity 10/11/2020   . Iron deficiency anemia 10/11/2020  . Acute metabolic encephalopathy 74/16/3845  . DKA, type 2, not at goal [facility]) 11/03/2019  . Heart failure ([place]) 02/02/2019  . Hyperglycemia   . Altered mental status 11/21/2018  . Diabetic foot ([place]) 07/23/2018  . Accidental fall 06/25/2018  . Supplemental oxygen dependent 06/25/2018  . 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 Neck pain 01/09/2018  . 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 Diabetic neuropathy ([place]) 12/13/2017  . Gastroesophageal reflux disease 12/13/2017  . Insomnia 07/30/2017  . Depression 07/19/2016  . OSA (obstructive sleep apnea)   . Chronic respiratory failure with hypercapnia ([place])   . Undifferentiated schizophrenia ([place])   . Uncontrolled type 2 diabetes mellitus with complication ([place])   . Anemia of chronic disease   . Paroxysmal atrial fibrillation (HCC)   . Hyponatremia 06/12/2016  . Aspiration pneumonia ([place]) 08/16/2015  . Seborrheic keratosis 08/04/2015  . Pure hypercholesterolemia 07/05/2015  . Obesity hypoventilation syndrome ([place]) 04/05/2015  . CSA (central sleep apnea) 04/05/2015  . Non compliance with medical treatment 04/05/2015  . Type 2 diabetes mellitus with hyperglycemia ([place]) 02/28/2015  . Acute respiratory failure with hypoxia and hypercarbia (HCC)   . Chronic diastolic CHF (congestive heart failure) ([place]) 08/06/2014  . Recurrent falls 05/08/2014  . Ankle fracture, right 05/08/2014  .  Ankle fracture, left 05/08/2014  . C. difficile colitis 01/04/2014  . Delirium 01/02/2014  . Acute encephalopathy 01/02/2014  . Morbid obesity ([place]) 06/17/2013  . Erosive esophagitis 04/22/2013  . Chronic respiratory failure 02 dep with hypercarbia 04/22/2013  . Type 2 diabetes mellitus without complication, with long-term current use of insulin ([place]) 04/21/2013  . Paranoid schizophrenia ([place]) 04/18/2013  . CAD (coronary artery disease) 04/18/2013  . DYSLIPIDEMIA 08/25/2008  . Obstructive sleep apnea 08/25/2008  .  Essential hypertension 08/25/2008  . Dyslipidemia 08/25/2008    Past Surgical History:  Procedure Laterality Date  . COLON SURGERY  March 2010   secondary to large colon polyp, final path per discharge summary notes tubulovillous adenoma.   . COLONOSCOPY  July 2011   Dr. Benson [place]: multiple polyps, internal and external hemorrhoids, diverticulosis. tubular adenoma  . ESOPHAGOGASTRODUODENOSCOPY (EGD) WITH PROPOFOL N/A 04/20/2013   Procedure: ESOPHAGOGASTRODUODENOSCOPY (EGD) WITH PROPOFOL;  Surgeon: Daneil Dolin, MD;  Location: [facility];  Service: Endoscopy;  Laterality: N/A;  . KNEE SURGERY     X2       Family History  Problem Relation Age of Onset  . Thyroid disease Mother   . Parkinson's disease Father   . Parkinson's disease Other   . Colon cancer Neg Hx     Social History   Tobacco Use  . Smoking status: Former Smoker    Packs/day: 1.00    Years: 50.00    Pack years: 50.00    Types: Cigarettes    Quit date: 09/16/2013    Years since quitting: 7.1  . Smokeless tobacco: Never Used  Substance Use Topics  . Alcohol use: No    Comment: history of ETOH abuse in remote past, none in at least 10 years  . Drug use: No    Home Medications Prior to Admission medications   Medication Sig Start Date End Date Taking? Authorizing Provider  acetaminophen (TYLENOL) 325 MG tablet Take 2 tablets (650 mg total) by mouth every 6 (six) hours as needed for mild pain, fever or headache (or Fever >/= 101). Patient not taking: No sig reported 02/22/20   Roxan Hockey, MD  albuterol (PROVENTIL) (2.5 MG/3ML) 0.083% nebulizer solution Take 3 mLs (2.5 mg total) by nebulization every 6 (six) hours as needed for wheezing or shortness of breath. 02/22/20   Roxan Hockey, MD  apixaban (ELIQUIS) 5 MG TABS tablet Take 1 tablet (5 mg total) by mouth 2 (two) times daily. 02/22/20   Roxan Hockey, MD  blood glucose meter kit and supplies Relion Prime or Dispense other brand based on patient and insurance  preference. Use up to four times daily as directed. (FOR ICD-9 250.00, 250.01). 02/22/20   Roxan Hockey, MD  blood glucose meter kit and supplies Dispense based on patient and insurance preference. Use up to four times daily as directed. (FOR ICD-9 250.00, 250.01).  --Otherwise may dispense ReliOn Prime glucometer, test strips and lancets 02/22/20   Roxan Hockey, MD  carboxymethylcellulose (REFRESH PLUS) 0.5 % SOLN Apply 1 drop to eye 3 (three) times daily as needed.    [provider]  cholecalciferol (VITAMIN D) 1000 UNITS tablet Take 1,000 Units by mouth 2 (two) times daily.     [provider]  Cinnamon 500 MG capsule Take 1,000 mg by mouth in the morning and at bedtime.     [provider]  docusate sodium (COLACE) 100 MG capsule Take 100 mg by mouth 2 (two) times daily.    [provider]  DULoxetine (CYMBALTA) 30 MG capsule Take 1 capsule (30 mg total) by mouth every morning. 02/22/20   Roxan Hockey, MD  Fluticasone-Umeclidin-Vilant (TRELEGY ELLIPTA) 100-62.5-25 MCG/INH AEPB Inhale 1 puff into the lungs daily. 02/22/20   Roxan Hockey, MD  gabapentin (NEURONTIN) 300 MG capsule Take 1 capsule (300 mg total) by mouth at bedtime. 10/18/20   Geradine Girt, DO  hydrOXYzine (ATARAX/VISTARIL) 25 MG tablet Take 1 tablet (25 mg total) by mouth every 12 (twelve) hours as needed for anxiety or itching (Anxiety and insomnia or restlessness). 02/22/20   Roxan Hockey, MD  insulin aspart (NOVOLOG) 100 UNIT/ML injection Inject 0-15 Units into the skin 3 (three) times daily with meals. 10/18/20   Geradine Girt, DO  insulin glargine (LANTUS SOLOSTAR) 100 UNIT/ML Solostar Pen Inject 20 Units into the skin at bedtime. 10/18/20   Eulogio Bear U, DO  melatonin 3 MG TABS tablet Take 6 mg by mouth at bedtime.    [provider]  metoprolol tartrate (LOPRESSOR) 25 MG tablet Take 1 tablet (25 mg total) by mouth 2 (two) times daily. 10/18/20   Geradine Girt, DO   Multiple Vitamins-Minerals (CENTRUM SILVER 50+MEN PO) Take 1 tablet by mouth daily.    [provider]  Multiple Vitamins-Minerals (PRESERVISION AREDS 2+MULTI VIT PO) Take 2 tablets by mouth in the morning and at bedtime. Patient not taking: Reported on 10/11/2020    [provider]  OXYGEN Inhale 3 L into the lungs continuous.    [provider]  pantoprazole (PROTONIX) 40 MG tablet Take 1 tablet (40 mg total) by mouth 2 (two) times daily before a meal. Patient not taking: No sig reported 02/22/20   Roxan Hockey, MD  QUEtiapine (SEROQUEL) 25 MG tablet Take 1 tablet (25 mg total) by mouth in the morning. 02/22/20   Roxan Hockey, MD  QUEtiapine (SEROQUEL) 50 MG tablet Take 1 tablet (50 mg total) by mouth at bedtime. 02/22/20   Roxan Hockey, MD  simvastatin (ZOCOR) 40 MG tablet Take 40 mg by mouth every evening.    [provider]  tiotropium (SPIRIVA) 18 MCG inhalation capsule Place 1 capsule (18 mcg total) into inhaler and inhale daily. 02/22/20   Roxan Hockey, MD  triamcinolone cream (KENALOG) 0.1 % Apply 1 application topically 2 (two) times daily. 02/11/20   Avegno, Darrelyn Hillock, FNP  TRULICITY 3 JJ/8.8CZ SOPN Inject 3 mg into the skin every Friday. 06/07/20   [provider]    Allergies    Phenergan [promethazine hcl], Haldol [haloperidol lactate], and Metformin and related  Review of Systems   Review of Systems  Constitutional: Negative for activity change, appetite change and fever.  HENT: Negative for congestion and rhinorrhea.   Respiratory: Negative for cough, chest tightness, shortness of breath and wheezing.   Cardiovascular: Negative for chest pain.  Gastrointestinal: Negative for abdominal pain, nausea and vomiting.  Genitourinary: Negative for dysuria and hematuria.  Musculoskeletal: Positive for arthralgias, gait problem, myalgias and neck pain.  Skin: Negative for rash.  Neurological: Positive for headaches.   all other  systems are negative except as noted in the HPI and PMH.    Physical Exam Updated Vital Signs BP 95/77 (BP Location: Right Arm)   Pulse 79   Temp 98.9 F (37.2 C) (Oral)   Resp 19   Ht 6' (1.829 m)   Wt 107.5 kg   SpO2 96%   BMI 32.14 kg/m   Physical Exam Vitals and nursing note reviewed.  Constitutional:  General: He is not in acute distress.    Appearance: He is well-developed and well-nourished. He is obese.  HENT:     Head: Normocephalic and atraumatic.     Mouth/Throat:     Mouth: Oropharynx is clear and moist.     Pharynx: No oropharyngeal exudate.  Eyes:     Extraocular Movements: EOM normal.     Conjunctiva/sclera: Conjunctivae normal.     Pupils: Pupils are equal, round, and reactive to light.  Neck:     Comments: C-collar in place, paraspinal tenderness on the left, no midline tenderness Cardiovascular:     Rate and Rhythm: Normal rate and regular rhythm.     Pulses: Intact distal pulses.     Heart sounds: Normal heart sounds. No murmur heard.   Pulmonary:     Effort: Pulmonary effort is normal. No respiratory distress.     Breath sounds: Normal breath sounds.  Abdominal:     Palpations: Abdomen is soft.     Tenderness: There is no abdominal tenderness. There is no guarding or rebound.  Musculoskeletal:        General: Tenderness, deformity and signs of injury present. No edema. Normal range of motion.     Comments: Left leg is shortened and externally rotated.  Pain with attempted range of motion of left knee and hip.  Intact DP pulses bilaterally Feet are cool bilaterally with purple discoloration of second and third digits bilaterally. Intact PT pulses with Doppler. Previous surgical resection of left small toe.  Well-healed surgical scar to left knee.  Left knee is held in flexed position.  Patient yells with attempted straightening of his leg.  He is unable to lift leg off the stretcher.  There is a questionable discontinuity of his quadricep  tendon on the left  Paraspinal lumbar tenderness on the left, no midline tenderness  Skin:    General: Skin is warm.  Neurological:     Mental Status: He is alert.     Cranial Nerves: No cranial nerve deficit.     Motor: No abnormal muscle tone.     Coordination: Coordination normal.     Comments: Oriented to person and place.  Knows he is in the hospital.  No facial droop.  Decreased strength in left arm and left leg at baseline per his report.  Psychiatric:        Mood and Affect: Mood and affect normal.        Behavior: Behavior normal.     ED Results / Procedures / Treatments   Labs (all labs ordered are listed, but only abnormal results are displayed) Labs Reviewed  RESP PANEL BY RT-PCR (FLU A&B, COVID) ARPGX2 - Abnormal; Notable for the following components:      Result Value   SARS Coronavirus 2 by RT PCR POSITIVE (*)    All other components within normal limits  URINALYSIS, ROUTINE W REFLEX MICROSCOPIC - Abnormal; Notable for the following components:   APPearance CLOUDY (*)    Glucose, UA 50 (*)    Hgb urine dipstick SMALL (*)    Leukocytes,Ua LARGE (*)    WBC, UA >50 (*)    Bacteria, UA RARE (*)    All other components within normal limits  CBC WITH DIFFERENTIAL/PLATELET - Abnormal; Notable for the following components:   WBC 12.9 (*)    Hemoglobin 11.6 (*)    HCT 38.6 (*)    MCH 24.3 (*)    Platelets 413 (*)    Neutro Abs  9.1 (*)    Abs Immature Granulocytes 0.15 (*)    All other components within normal limits  BASIC METABOLIC PANEL - Abnormal; Notable for the following components:   Sodium 131 (*)    Chloride 95 (*)    BUN 7 (*)    Calcium 8.7 (*)    All other components within normal limits  URINE CULTURE    EKG EKG Interpretation  Date/Time:  Friday November 11 2020 00:15:06 EST Ventricular Rate:  78 PR Interval:    QRS Duration: 132 QT Interval:  405 QTC Calculation: 462 R Axis:   -4 Text Interpretation: Sinus rhythm Nonspecific  intraventricular conduction delay No significant change was found Confirmed by Ezequiel Essex [phone]) on 11/11/2020 12:17:18 AM   Radiology DG Knee 2 Views Left  Result Date: 11/11/2020 CLINICAL DATA:  Unwitnessed fall EXAM: LEFT KNEE - 1-2 VIEW COMPARISON:  None. FINDINGS: There does appear to be a small knee joint effusion. There is some calcification associated with the distal quadriceps tendon. Cannot rule out the possibility of a quadriceps tendon avulsion. I would expect to see more soft tissue swelling in the region, but this injury remains possible. No fracture of the femur, tibia or fibula. Chronic chondrocalcinosis of the menisci. IMPRESSION: 1. Small joint effusion. Calcification associated with the distal quadriceps tendon. Cannot rule out the possibility of a quadriceps tendon avulsion. I would expect more edema in the region but this injury does remain possible. 2. Chronic chondrocalcinosis of the menisci. 3. No fracture of the femur, tibia or fibula. Electronically Signed   By: Nelson Chimes M.D.   On: 11/11/2020 00:14   CT Head Wo Contrast  Result Date: 11/10/2020 CLINICAL DATA:  Fall with trauma to the head and neck. EXAM: CT HEAD WITHOUT CONTRAST CT CERVICAL SPINE WITHOUT CONTRAST TECHNIQUE: Multidetector CT imaging of the head and cervical spine was performed following the standard protocol without intravenous contrast. Multiplanar CT image reconstructions of the cervical spine were also generated. COMPARISON:  10/11/2020 FINDINGS: CT HEAD FINDINGS Brain: Generalized atrophy. Chronic small-vessel ischemic changes of the pons, thalami I and cerebral hemispheric white matter. No sign of acute infarction, mass lesion, hemorrhage, hydrocephalus or extra-axial collection. Vascular: There is atherosclerotic calcification of the major vessels at the base of the brain. Skull: No skull fracture. Sinuses/Orbits: Clear/normal Other: None CT CERVICAL SPINE FINDINGS Alignment: Straightening of the  normal cervical lordosis. No traumatic malalignment. Degenerative anterolisthesis at C7-T1 of 3 mm. Skull base and vertebrae: No regional fracture. Soft tissues and spinal canal: No traumatic soft tissue finding. Disc levels:  C1-2: Osteoarthritis without stenosis. C2-3: Chronic fusion.  Wide patency of the canal and foramina. C3-4: Bilateral uncovertebral hypertrophy and facet osteoarthritis. Bilateral foraminal stenosis that could affect either C4 nerve. C4-5: Endplate osteophytes and bulging of the disc more prominent towards the right. Possible calcified disc herniation on the right. Right canal and foraminal stenosis that could cause right-sided neural compression. C5-6: Prominent anterior osteophytes. Posterior osteophytes and protruding disc material more prominent towards the right. Right-sided canal and foraminal stenosis that could cause right-sided neural compression. C6-7: Bulging of the disc. Uncovertebral prominence. Mild bilateral foraminal stenosis. C7-T1: Chronic facet arthropathy with 3 mm of anterolisthesis. No central canal stenosis. Bilateral foraminal narrowing that could affect either C8 nerve. Upper chest: Negative Other: None IMPRESSION: HEAD CT: No acute or traumatic finding. Atrophy and chronic small-vessel ischemic changes. CERVICAL SPINE CT: No acute or traumatic finding. Advanced chronic degenerative spondylosis and facet arthropathy as outlined above. Canal  and foraminal stenosis that could cause neural compression on the right at C4-5, C5-6 and bilaterally at C7-T1. Electronically Signed   By: Nelson Chimes M.D.   On: 11/10/2020 23:38   CT Cervical Spine Wo Contrast  Result Date: 11/10/2020 CLINICAL DATA:  Fall with trauma to the head and neck. EXAM: CT HEAD WITHOUT CONTRAST CT CERVICAL SPINE WITHOUT CONTRAST TECHNIQUE: Multidetector CT imaging of the head and cervical spine was performed following the standard protocol without intravenous contrast. Multiplanar CT image  reconstructions of the cervical spine were also generated. COMPARISON:  10/11/2020 FINDINGS: CT HEAD FINDINGS Brain: Generalized atrophy. Chronic small-vessel ischemic changes of the pons, thalami I and cerebral hemispheric white matter. No sign of acute infarction, mass lesion, hemorrhage, hydrocephalus or extra-axial collection. Vascular: There is atherosclerotic calcification of the major vessels at the base of the brain. Skull: No skull fracture. Sinuses/Orbits: Clear/normal Other: None CT CERVICAL SPINE FINDINGS Alignment: Straightening of the normal cervical lordosis. No traumatic malalignment. Degenerative anterolisthesis at C7-T1 of 3 mm. Skull base and vertebrae: No regional fracture. Soft tissues and spinal canal: No traumatic soft tissue finding. Disc levels:  C1-2: Osteoarthritis without stenosis. C2-3: Chronic fusion.  Wide patency of the canal and foramina. C3-4: Bilateral uncovertebral hypertrophy and facet osteoarthritis. Bilateral foraminal stenosis that could affect either C4 nerve. C4-5: Endplate osteophytes and bulging of the disc more prominent towards the right. Possible calcified disc herniation on the right. Right canal and foraminal stenosis that could cause right-sided neural compression. C5-6: Prominent anterior osteophytes. Posterior osteophytes and protruding disc material more prominent towards the right. Right-sided canal and foraminal stenosis that could cause right-sided neural compression. C6-7: Bulging of the disc. Uncovertebral prominence. Mild bilateral foraminal stenosis. C7-T1: Chronic facet arthropathy with 3 mm of anterolisthesis. No central canal stenosis. Bilateral foraminal narrowing that could affect either C8 nerve. Upper chest: Negative Other: None IMPRESSION: HEAD CT: No acute or traumatic finding. Atrophy and chronic small-vessel ischemic changes. CERVICAL SPINE CT: No acute or traumatic finding. Advanced chronic degenerative spondylosis and facet arthropathy as  outlined above. Canal and foraminal stenosis that could cause neural compression on the right at C4-5, C5-6 and bilaterally at C7-T1. Electronically Signed   By: Nelson Chimes M.D.   On: 11/10/2020 23:38   CT Knee Left Wo Contrast  Result Date: 11/11/2020 CLINICAL DATA:  Fall.  Pain.  Abnormal knee radiographs. EXAM: CT OF THE  KNEE WITHOUT CONTRAST TECHNIQUE: Multidetector CT imaging of the left knee was performed according to the standard protocol. Multiplanar CT image reconstructions were also generated. COMPARISON:  Knee radiography same day FINDINGS: No evidence of acute fracture of the femur, proximal tibia, or proximal fibula. I do think there is at least a partial tear of the quadriceps tendon, with soft tissue edema in the region and absent appearance of normal tendon morphology. Small calcifications in the distal quadriceps tendon are probably avulsed from the patella. Mild patella baja. Patellar tendon does not appear torn. No evidence of medial or lateral collateral ligament injury. Anterior and posterior cruciate ligaments appear intact. Age related chondrocalcinosis of the menisci. No definite meniscal tear evident on this standard CT. IMPRESSION: 1. At least a partial tear of the quadriceps tendon, with absent appearance of normal tendon morphology. Small calcifications in the distal quadriceps tendon are probably avulsed from the superior patella. No evidence of medial or lateral collateral ligament injury. Cruciates grossly intact. Electronically Signed   By: Nelson Chimes M.D.   On: 11/11/2020 01:33   CT  Angio Aortobifemoral W and/or Wo Contrast  Result Date: 11/11/2020 CLINICAL DATA:  Claudication, fall EXAM: CT ANGIOGRAPHY OF ABDOMINAL AORTA WITH ILIOFEMORAL RUNOFF TECHNIQUE: Multidetector CT imaging of the pelvis and lower extremities was performed using the standard protocol during bolus administration of intravenous contrast. Multiplanar CT image reconstructions and MIPs were obtained to  evaluate the vascular anatomy. CONTRAST:  160m OMNIPAQUE IOHEXOL 350 MG/ML SOLN COMPARISON:  CT and radiographs 11/11/2020 FINDINGS: VASCULAR Aorta bifurcation: Atherosclerotic plaque within the normal caliber aorta without aneurysm, dissection, vasculitis or significant stenosis. RIGHT Lower Extremity Inflow: Calcified and noncalcified atheromatous plaque in the inflow vessels. No significant flow-limiting stenosis or occlusion within the patent common, internal and external iliac. No evidence of aneurysm, dissection, vasculitis or significant stenosis. Outflow: Calcified and noncalcified atheromatous plaque seen within the common, superficial and deep femoral arteries. Minimal plaque narrowing at the deep femoral artery origin. Some multifocal, short segmental luminal narrowing of the superficial femoral and popliteal arteries secondary to atheromatous plaque throughout the course. No high-grade stenosis or occlusion is seen. No evidence of aneurysm, dissection or vasculitis. Runoff: Calcified noncalcified plaque seen within the tibioperoneal trunk and runoff vessels including some multifocal segmental narrowing of the anterior tibial artery. Initial imaging demonstrates some attenuation of the contrast bolus in the lower extremity which is better of opacified on the delayed phase imaging but attenuated contrast opacification by the below the level of the ankle. LEFT Lower Extremity Inflow: Calcified and noncalcified atheromatous plaque in the inflow vessels. No significant flow-limiting stenosis or occlusion within the patent common, internal and external iliac. No evidence of aneurysm, dissection, vasculitis or significant stenosis. Outflow: Calcified and noncalcified atheromatous plaque seen within the common, superficial and deep femoral arteries. Minimal plaque narrowing at the deep femoral artery origin. Multifocal sites of segmental mild-to-moderate luminal narrowing throughout the superficial femoral and  popliteal arteries. No high-grade stenosis or occlusion is seen. Some minimal fusiform dilatation of the popliteal artery at the level of the joint line. Runoff: Constipated noncalcified plaque seen within the runoff vasculature. Better opacification of the runoff vasculature with three-vessel runoff to the level of the ankle. No evidence of aneurysm, dissection or vasculitis. Veins: Major venous structures are unremarkable within the limitations of this arterial phase examination. Review of the MIP images confirms the above findings. NON-VASCULAR Urinary Tract: Circumferential thickening of the urinary bladder. Indentation of the bladder base by an enlarged prostate. No visible bladder calculi or debris. Stomach/Bowel: No evidence of bowel wall thickening, distention, or inflammatory changes. Lymphatic: No suspicious or enlarged lymph nodes in the included lymphatic chains. Reproductive: Top-normal prostate size. Prostate calcifications are present. Seminal vesicles are unremarkable. Other: Fat containing bilateral inguinal hernias. Musculoskeletal: No clear acute fracture or traumatic osseous injury of the included bony pelvis. Left lower extremity is noted to be in external rotation. Left femur is intact. Redemonstrated changes suggestive of a left quadriceps tendon tear with some partial tendon retraction and associated avulsive fragmentation, better detailed on dedicated CT knee. Left joint effusion and likely hemarthrosis. No additional fracture or traumatic osseous injury of the knee. Remote nonunited fracture of the distal left tibia. Additional remote posttraumatic deformity of the right ankle. Largely healed posttraumatic deformity of the right hip. Right knee is in expected alignment. Posttraumatic deformity of the proximal right fibular diaphysis. Additional distal healed lateral malleolar fracture. No acute osseous abnormality in the right lower extremity. IMPRESSION: 1. Multifocal sites of segmental  mild-to-moderate luminal narrowing throughout the superficial femoral and popliteal arteries bilaterally. Additional plaque  seen throughout the runoff vessels bilaterally. No high-grade stenosis or occlusion is seen. No acute vascular injuries are identified. Some asymmetrically delayed opacification of the runoff vasculature in the right lower extremity. While this may in part be due to contrast timing could reflect some slow/diminished flow in the setting of atheromatous disease. 2.  Aortic Atherosclerosis (ICD10-I70.0). 3. Redemonstrated changes suggestive of a left quadriceps tendon tear with some partial tendon retraction and associated avulsive fragmentation likely rising from the superior pole left patella, better detailed on dedicated CT left knee. Left joint effusion and likely hemarthrosis. 4. Remote posttraumatic deformity of the right hip, fibula and ankle. 5. Circumferential thickening of the urinary bladder, possibly related to chronic outlet obstruction given the enlarged prostate. Correlate with urinalysis to exclude cystitis. 6. Fat containing bilateral inguinal hernias. Electronically Signed   By: Lovena Le M.D.   On: 11/11/2020 03:56   CT Hip Left Wo Contrast  Result Date: 11/11/2020 CLINICAL DATA:  Fall.  Hip pain. EXAM: CT OF THE LEFT HIP WITHOUT CONTRAST TECHNIQUE: Multidetector CT imaging of the left hip was performed according to the standard protocol. Multiplanar CT image reconstructions were also generated. COMPARISON:  Radiography earlier today. FINDINGS: No left hip fracture. Acetabulum intact. Superior and inferior rami are normal. No fracture of proximal femur. Chronic corticated calcification adjacent to the lesser trochanter. No evidence of soft tissue hematoma or joint effusion. IMPRESSION: No acute or traumatic finding. No evidence of hip fracture. Chronic corticated calcification adjacent to the lesser trochanter. Electronically Signed   By: Nelson Chimes M.D.   On:  11/11/2020 01:25   DG Chest Portable 1 View  Result Date: 11/11/2020 CLINICAL DATA:  75 year old male positive COVID. Hypoxia. Combative. EXAM: PORTABLE CHEST 1 VIEW COMPARISON:  Portable chest 10/14/2020 and earlier. FINDINGS: Portable AP semi upright view at 0503 hours. The patient is more rotated to the left. Lung volumes and mediastinal contours are stable, borderline to mild cardiomegaly. Lung markings appear stable. Allowing for portable technique the lungs are clear. No pneumothorax or pleural effusion identified. Chronic right lower rib fractures. Stable visualized osseous structures. IMPRESSION: Stable.  No acute cardiopulmonary abnormality. Electronically Signed   By: Genevie Ann M.D.   On: 11/11/2020 05:18   DG Hip Unilat W or Wo Pelvis 2-3 Views Left  Result Date: 11/11/2020 CLINICAL DATA:  Unwitnessed fall.  Left hip pain. EXAM: DG HIP (WITH OR WITHOUT PELVIS) 2-3V LEFT COMPARISON:  None. FINDINGS: There is no evidence of hip fracture or dislocation. There is no evidence of arthropathy or other focal bone abnormality. IMPRESSION: Negative. Electronically Signed   By: Nelson Chimes M.D.   On: 11/11/2020 00:12   DG Femur Min 2 Views Left  Result Date: 11/11/2020 CLINICAL DATA:  Unwitnessed fall. EXAM: LEFT FEMUR 2 VIEWS COMPARISON:  Hip and knee films earlier. FINDINGS: Two views of the midportion of the femur do not show a visible fracture. Chronic corticated calcification adjacent to lesser trochanter. IMPRESSION: No acute or traumatic finding. Electronically Signed   By: Nelson Chimes M.D.   On: 11/11/2020 01:23    Procedures Procedures   Medications Ordered in ED Medications - No data to display  ED Course  I have reviewed the triage vital signs and the nursing notes.  Pertinent labs & imaging results that were available during my care of the patient were reviewed by me and considered in my medical decision making (see chart for details).    MDM Rules/Calculators/A&P  Patient multiple medical problems including recent stroke and anticoagulation here with unwitnessed fall.  Did not hit head or lose consciousness.  Complains of left hip pain and is shortened and externally rotated on exam.  CT head and C spine negative for acute traumatic injury.   Hip x-ray is negative.  Knee x-ray does show small effusion questionable quadricep tendon avulsion. By report, patient is not ambulatory at the nursing home. Discussed with Dr. Aline Brochure.  He agrees with knee immobilizer and outpatient follow-up.  Does not feel patient will need admission for this injury.  It is unclear whether patient is ambulatory at baseline. Dr. Aline Brochure asked to be called in the morning if patient is admitted.  Patient did have 1 episode dropped beat on monitoring.  He was asymptomatic.  CT imaging does show likely quadricep tendon rupture on the left.  No fracture.  Patient placed in the knee immobilizer. Labs at baseline.   Per facility, patient is mostly nonambulatory but uses a walker at times.  D/w patient's wife by phone [name].  She states patient has not been walking since his stroke.  She believes he had previous surgery on his left knee many years ago. CT imaging does not show any fractures.  Labs are at baseline.  Patient appears stable to return to his facility continue working with PT and OT and follow-up with orthopedic surgery for possible repair of his tendon.  To remain nonweightbearing on his left leg.  On attempted discharge, patient's lower extremities appeared cool with purple discoloration of second and third digits bilaterally. PT pulses are present with Doppler. DP pulses are hard to palpate  CTA shows no acute high-grade stenosis or vascular occlusion.  Does show atherosclerotic plaque without evidence of acute occlusion or high-grade stenosis.  Will need vascular surgery follow-up.  Covid positive as expected.  Facility reports he was +16 days ago.  He is  asymptomatic and has no respiratory distress or hypoxia. His oxygenation has been 97% on room air.  Chart review shows he is supposed to be on 3 L of oxygen but he was not wearing this on arrival.  There are some spurious values of 83 and 78% but these do not seem accurate.  Patient has difficulty keep the pulse ox probe on his finger.  When it is checked properly the O2 saturations are in the 95-99 range on room air.doubt pulmonary embolism as patient has been on eliquis.  Urine suspicious for UTI.  Culture is sent.  Keflex begun.  Patient appears stable to return to his facility.  Will need orthopedic evaluation.  Keflex for UTI.  Culture pending. Return precautions discussed.  BP (!) 139/98   Pulse 93   Temp 98.9 F (37.2 C) (Oral)   Resp 17   Ht 6' (1.829 m)   Wt 107.5 kg   SpO2 100%   BMI 32.14 kg/m    Final Clinical Impression(s) / ED Diagnoses Final diagnoses:  Fall, initial encounter  Quadriceps tendon rupture, left, initial encounter  PVD (peripheral vascular disease) ([place])  Acute cystitis without hematuria    Rx / DC Orders ED Discharge Orders    None       Conan Mcmanaway, Annie Main, MD 11/11/20 [phone]

## 2020-11-10 NOTE — ED Triage Notes (Signed)
Pt had unwitnessed fall at [facility]. Pt found on left side in floor, pt c/o left hip pain, neck and back pain.

## 2020-11-11 ENCOUNTER — Emergency Department (HOSPITAL_COMMUNITY): Payer: Medicare Other

## 2020-11-11 ENCOUNTER — Telehealth: Payer: Self-pay

## 2020-11-11 DIAGNOSIS — N3000 Acute cystitis without hematuria: Secondary | ICD-10-CM | POA: Diagnosis not present

## 2020-11-11 LAB — URINALYSIS, ROUTINE W REFLEX MICROSCOPIC
Bilirubin Urine: NEGATIVE
Glucose, UA: 50 mg/dL — AB
Ketones, ur: NEGATIVE mg/dL
Nitrite: NEGATIVE
Protein, ur: NEGATIVE mg/dL
Specific Gravity, Urine: 1.006 (ref 1.005–1.030)
WBC, UA: >50 WBC/hpf — ABNORMAL HIGH (ref 0–5)
pH: 6 (ref 5.0–8.0)

## 2020-11-11 LAB — CBC WITH DIFFERENTIAL/PLATELET
Abs Immature Granulocytes: 0.15 10*3/uL — ABNORMAL HIGH (ref 0.00–0.07)
Basophils Absolute: 0.1 10*3/uL (ref 0.0–0.1)
Basophils Relative: 1 %
Eosinophils Absolute: 0.3 10*3/uL (ref 0.0–0.5)
Eosinophils Relative: 3 %
HCT: 38.6 % — ABNORMAL LOW (ref 39.0–52.0)
Hemoglobin: 11.6 g/dL — ABNORMAL LOW (ref 13.0–17.0)
Immature Granulocytes: 1 %
Lymphocytes Relative: 18 %
Lymphs Abs: 2.4 10*3/uL (ref 0.7–4.0)
MCH: 24.3 pg — ABNORMAL LOW (ref 26.0–34.0)
MCHC: 30.1 g/dL (ref 30.0–36.0)
MCV: 80.9 fL (ref 80.0–100.0)
Monocytes Absolute: 0.8 10*3/uL (ref 0.1–1.0)
Monocytes Relative: 7 %
Neutro Abs: 9.1 10*3/uL — ABNORMAL HIGH (ref 1.7–7.7)
Neutrophils Relative %: 70 %
Platelets: 413 10*3/uL — ABNORMAL HIGH (ref 150–400)
RBC: 4.77 MIL/uL (ref 4.22–5.81)
RDW: 14.6 % (ref 11.5–15.5)
WBC: 12.9 10*3/uL — ABNORMAL HIGH (ref 4.0–10.5)
nRBC: 0 % (ref 0.0–0.2)

## 2020-11-11 LAB — BASIC METABOLIC PANEL
Anion gap: 8 (ref 5–15)
BUN: 7 mg/dL — ABNORMAL LOW (ref 8–23)
CO2: 28 mmol/L (ref 22–32)
Calcium: 8.7 mg/dL — ABNORMAL LOW (ref 8.9–10.3)
Chloride: 95 mmol/L — ABNORMAL LOW (ref 98–111)
Creatinine, Ser: 0.79 mg/dL (ref 0.61–1.24)
GFR, Estimated: >60 mL/min (ref >60)
Glucose, Bld: 91 mg/dL (ref 70–99)
Potassium: 4 mmol/L (ref 3.5–5.1)
Sodium: 131 mmol/L — ABNORMAL LOW (ref 135–145)

## 2020-11-11 LAB — RESP PANEL BY RT-PCR (FLU A&B, COVID) ARPGX2
Influenza A by PCR: NEGATIVE
Influenza B by PCR: NEGATIVE
SARS Coronavirus 2 by RT PCR: POSITIVE — AB

## 2020-11-11 MED ORDER — CEPHALEXIN 500 MG PO CAPS
500.0000 mg | ORAL_CAPSULE | Freq: Once | ORAL | Status: AC
  Administered 2020-11-11: 500 mg via ORAL
  Filled 2020-11-11: qty 1

## 2020-11-11 MED ORDER — CEPHALEXIN 500 MG PO CAPS: 500.0000 mg | ORAL_CAPSULE | Freq: Three times a day (TID) | ORAL | 0 refills | Status: DC

## 2020-11-11 MED ORDER — IOHEXOL 350 MG/ML SOLN
125.0000 mL | Freq: Once | INTRAVENOUS | Status: AC | PRN
  Administered 2020-11-11: 125 mL via INTRAVENOUS

## 2020-11-11 NOTE — Telephone Encounter (Signed)
Naomi with [facility] called to schedule a new pt appt for next week per ED discharge summary. Pt has been scheduled for ABI's and to see MD next week. I have left Naomi a voicemail with this appt info and asked her to call us back to confirm.

## 2020-11-11 NOTE — ED Notes (Signed)
Date and time results received: 11/11/20 2:36 AM(use smartphrase ".now" to insert current time)  Test: covid Critical Value: positive  Name of Provider Notified: DR Rancour  Orders Received? Or Actions Taken?: see chart

## 2020-11-11 NOTE — ED Notes (Signed)
Pt on phone with wife

## 2020-11-11 NOTE — Discharge Instructions (Addendum)
It appears your quadricep tendon is ruptured in the left leg. Wear your home oxygen.  Wear the knee immobilizer, do not place weight on the leg and follow-up with Dr. Aline Brochure in the orthopedic surgery office.  Followup with the vascular surgeon Dr. [place] Breed regarding the diseased arteries in your legs. Return to the ED with new or worsening symptoms.

## 2020-11-11 NOTE — ED Notes (Signed)
purewick placed

## 2020-11-12 LAB — URINE CULTURE: Culture: NO GROWTH

## 2020-11-14 ENCOUNTER — Other Ambulatory Visit: Payer: Self-pay

## 2020-11-14 ENCOUNTER — Encounter: Payer: Self-pay | Admitting: Orthopedic Surgery

## 2020-11-14 ENCOUNTER — Ambulatory Visit (INDEPENDENT_AMBULATORY_CARE_PROVIDER_SITE_OTHER): Payer: Medicare Other | Admitting: Orthopedic Surgery

## 2020-11-14 VITALS — BP 81/61 | HR 102 | Ht 72.0 in

## 2020-11-14 DIAGNOSIS — I639 Cerebral infarction, unspecified: Secondary | ICD-10-CM

## 2020-11-14 DIAGNOSIS — S76112A Strain of left quadriceps muscle, fascia and tendon, initial encounter: Secondary | ICD-10-CM | POA: Diagnosis not present

## 2020-11-14 DIAGNOSIS — I739 Peripheral vascular disease, unspecified: Secondary | ICD-10-CM

## 2020-11-14 NOTE — Progress Notes (Signed)
NEW PROBLEM//OFFICE VISIT  Summary assessment and plan:   75 year old male with diabetes COPD congestive heart failure on Eliquis also has oxygen dependent COPD has had multiple falls multiple procedures on the left quadriceps unclear his walking status prior to most recent fall.  Of asked the facility to call me to discuss.  Patient may or may not be a candidate for repair  Keep in knee immobilizer follow-up 6 weeks or sooner once I get more information  Chief Complaint  Patient presents with  . Leg Pain    Left quadriceps area painful / facility states it interferes with therapy they want to know if you think old or new and if he can participate in therapy     75 year old male with congestive heart failure on Eliquis also has hypertension diabetes COPD requiring oxygen has had multiple falls injured his left knee.  History of 2 procedures for left quadriceps tendon repair.  Walking status unknown  Patient poor historian  No bruising seen in the area in question left knee although an incision is there from prior surgery   Review of Systems  Respiratory: Positive for shortness of breath.   Cardiovascular: Positive for leg swelling.     Past Medical History:  Diagnosis Date  . Arthritis   . 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 CHF (congestive heart failure) (H[facility])    diastolic   . Chronic pain   . COPD (chronic obstructive pulmonary disease) ([place])   . Depression   . Diabetes mellitus without complication (HCC)    insulin pump  . Hypercholesterolemia   . Hypertension   . On home O2    3 liters  . Oxygen deficiency   . Schizophrenia ([place])   . Sleep apnea    noncompliant with BiPAP  . Sleep apnea   . Tobacco abuse     Past Surgical History:  Procedure Laterality Date  . COLON SURGERY  March 2010   secondary to large colon polyp, final path per discharge summary notes tubulovillous adenoma.   . COLONOSCOPY  July 2011   Dr. Benson Norway: multiple polyps, internal and  external hemorrhoids, diverticulosis. tubular adenoma  . ESOPHAGOGASTRODUODENOSCOPY (EGD) WITH PROPOFOL N/A 04/20/2013   Procedure: ESOPHAGOGASTRODUODENOSCOPY (EGD) WITH PROPOFOL;  Surgeon: Daneil Dolin, MD;  Location: [facility];  Service: Endoscopy;  Laterality: N/A;  . KNEE SURGERY     X2    Family History  Problem Relation Age of Onset  . Thyroid disease Mother   . Parkinson's disease Father   . Parkinson's disease Other   . Colon cancer Neg Hx    Social History   Tobacco Use  . Smoking status: Former Smoker    Packs/day: 1.00    Years: 50.00    Pack years: 50.00    Types: Cigarettes    Quit date: 09/16/2013    Years since quitting: 7.1  . Smokeless tobacco: Never Used  Substance Use Topics  . Alcohol use: No    Comment: history of ETOH abuse in remote past, none in at least 10 years  . Drug use: No    Allergies  Allergen Reactions  . Phenergan [Promethazine Hcl] Other (See Comments)    Becomes very confused and aggressive  . Haldol [Haloperidol Lactate] Other (See Comments)    Shaking   . Metformin And Related Diarrhea    Current Meds  Medication Sig  . albuterol (PROVENTIL) (2.5 MG/3ML) 0.083% nebulizer solution Take 3 mLs (2.5 mg total) by  nebulization every 6 (six) hours as needed for wheezing or shortness of breath.  Marland Kitchen apixaban (ELIQUIS) 5 MG TABS tablet Take 1 tablet (5 mg total) by mouth 2 (two) times daily.  . blood glucose meter kit and supplies Relion Prime or Dispense other brand based on patient and insurance preference. Use up to four times daily as directed. (FOR ICD-9 250.00, 250.01).  . blood glucose meter kit and supplies Dispense based on patient and insurance preference. Use up to four times daily as directed. (FOR ICD-9 250.00, 250.01).  --Otherwise may dispense ReliOn Prime glucometer, test strips and lancets  . carboxymethylcellulose (REFRESH PLUS) 0.5 % SOLN Apply 1 drop to eye 3 (three) times daily as needed.  . cephALEXin (KEFLEX) 500 MG  capsule Take 1 capsule (500 mg total) by mouth 3 (three) times daily.  . Cinnamon 500 MG capsule Take 1,000 mg by mouth in the morning and at bedtime.   . docusate sodium (COLACE) 100 MG capsule Take 100 mg by mouth 2 (two) times daily.  . DULoxetine (CYMBALTA) 30 MG capsule Take 1 capsule (30 mg total) by mouth every morning.  . Fluticasone-Umeclidin-Vilant (TRELEGY ELLIPTA) 100-62.5-25 MCG/INH AEPB Inhale 1 puff into the lungs daily.  Marland Kitchen gabapentin (NEURONTIN) 300 MG capsule Take 1 capsule (300 mg total) by mouth at bedtime.  . hydrOXYzine (ATARAX/VISTARIL) 25 MG tablet Take 1 tablet (25 mg total) by mouth every 12 (twelve) hours as needed for anxiety or itching (Anxiety and insomnia or restlessness).  . insulin aspart (NOVOLOG) 100 UNIT/ML injection Inject 0-15 Units into the skin 3 (three) times daily with meals.  . insulin glargine (LANTUS SOLOSTAR) 100 UNIT/ML Solostar Pen Inject 20 Units into the skin at bedtime.  . metoprolol tartrate (LOPRESSOR) 25 MG tablet Take 1 tablet (25 mg total) by mouth 2 (two) times daily.  . Multiple Vitamins-Minerals (CENTRUM SILVER 50+MEN PO) Take 1 tablet by mouth daily.  Marland Kitchen omeprazole (PRILOSEC) 20 MG capsule Take 20 mg by mouth daily.  . OXYGEN Inhale 3 L into the lungs continuous.  Marland Kitchen QUEtiapine (SEROQUEL) 50 MG tablet Take 1 tablet (50 mg total) by mouth at bedtime.  . simvastatin (ZOCOR) 40 MG tablet Take 40 mg by mouth every evening.  . tiotropium (SPIRIVA) 18 MCG inhalation capsule Place 1 capsule (18 mcg total) into inhaler and inhale daily.  Marland Kitchen triamcinolone cream (KENALOG) 0.1 % Apply 1 application topically 2 (two) times daily.  . TRULICITY 3 JA/2.5KN SOPN Inject 3 mg into the skin every Friday.    BP (!) 81/61   Pulse (!) 102   Ht 6' (1.829 m)   BMI 32.14 kg/m   Physical Exam Constitutional:      Comments: Mild obesity  Seems to be alert and awake oriented to person and place  Skin:    Capillary Refill: Capillary refill takes less than 2  seconds.  Neurological:     General: No focal deficit present.     Mental Status: He is alert. Mental status is at baseline.     Sensory: No sensory deficit.     Coordination: Coordination normal.     Gait: Gait abnormal.  Psychiatric:        Mood and Affect: Mood normal.        Behavior: Behavior normal.        Thought Content: Thought content normal.     75 year old male presents in a wheelchair his left knee has an incision which is well-healed there is no bruising or ecchymosis  there is a defect in the quadriceps tendon which is tender to palpation but not excessively so not consistent with recent injury may be a chronic defect  MEDICAL DECISION MAKING  A.  Encounter Diagnosis  Name Primary?  . Rupture of left quadriceps tendon, initial encounter Yes    B. DATA ANALYSED:   IMAGING: My Interpretation of images: Left knee CT scan shows a defect in the quadriceps tendon  AP lateral left knee obvious defect noted on x-ray in the soft tissue above the patella  This appears chronic  Orders: No new orders  Outside records reviewed: ER record   C. MANAGEMENT   Currently placing him in knee immobilizer until I get more history on the chronicity of this injury  No orders of the defined types were placed in this encounter.     Arther Abbott, MD  11/14/2020 12:03 PM

## 2020-11-15 ENCOUNTER — Ambulatory Visit (HOSPITAL_COMMUNITY): Payer: Medicare Other

## 2020-11-15 ENCOUNTER — Encounter: Payer: Medicare Other | Admitting: Vascular Surgery

## 2020-11-15 DIAGNOSIS — M6281 Muscle weakness (generalized): Secondary | ICD-10-CM | POA: Diagnosis not present

## 2020-11-15 DIAGNOSIS — R2689 Other abnormalities of gait and mobility: Secondary | ICD-10-CM | POA: Diagnosis not present

## 2020-11-15 DIAGNOSIS — R279 Unspecified lack of coordination: Secondary | ICD-10-CM | POA: Diagnosis not present

## 2020-11-15 DIAGNOSIS — M66852 Spontaneous rupture of other tendons, left thigh: Secondary | ICD-10-CM | POA: Diagnosis not present

## 2020-11-15 DIAGNOSIS — I639 Cerebral infarction, unspecified: Secondary | ICD-10-CM | POA: Diagnosis not present

## 2020-11-15 DIAGNOSIS — I480 Paroxysmal atrial fibrillation: Secondary | ICD-10-CM | POA: Diagnosis not present

## 2020-11-16 DIAGNOSIS — R279 Unspecified lack of coordination: Secondary | ICD-10-CM | POA: Diagnosis not present

## 2020-11-16 DIAGNOSIS — R2689 Other abnormalities of gait and mobility: Secondary | ICD-10-CM | POA: Diagnosis not present

## 2020-11-16 DIAGNOSIS — M6281 Muscle weakness (generalized): Secondary | ICD-10-CM | POA: Diagnosis not present

## 2020-11-16 DIAGNOSIS — I639 Cerebral infarction, unspecified: Secondary | ICD-10-CM | POA: Diagnosis not present

## 2020-11-16 DIAGNOSIS — I480 Paroxysmal atrial fibrillation: Secondary | ICD-10-CM | POA: Diagnosis not present

## 2020-11-16 DIAGNOSIS — M66852 Spontaneous rupture of other tendons, left thigh: Secondary | ICD-10-CM | POA: Diagnosis not present

## 2020-11-17 DIAGNOSIS — M66852 Spontaneous rupture of other tendons, left thigh: Secondary | ICD-10-CM | POA: Diagnosis not present

## 2020-11-17 DIAGNOSIS — I639 Cerebral infarction, unspecified: Secondary | ICD-10-CM | POA: Diagnosis not present

## 2020-11-17 DIAGNOSIS — R2689 Other abnormalities of gait and mobility: Secondary | ICD-10-CM | POA: Diagnosis not present

## 2020-11-17 DIAGNOSIS — R279 Unspecified lack of coordination: Secondary | ICD-10-CM | POA: Diagnosis not present

## 2020-11-17 DIAGNOSIS — I480 Paroxysmal atrial fibrillation: Secondary | ICD-10-CM | POA: Diagnosis not present

## 2020-11-17 DIAGNOSIS — M6281 Muscle weakness (generalized): Secondary | ICD-10-CM | POA: Diagnosis not present

## 2020-11-18 DIAGNOSIS — M6281 Muscle weakness (generalized): Secondary | ICD-10-CM | POA: Diagnosis not present

## 2020-11-18 DIAGNOSIS — I480 Paroxysmal atrial fibrillation: Secondary | ICD-10-CM | POA: Diagnosis not present

## 2020-11-18 DIAGNOSIS — M66852 Spontaneous rupture of other tendons, left thigh: Secondary | ICD-10-CM | POA: Diagnosis not present

## 2020-11-18 DIAGNOSIS — R279 Unspecified lack of coordination: Secondary | ICD-10-CM | POA: Diagnosis not present

## 2020-11-18 DIAGNOSIS — R2689 Other abnormalities of gait and mobility: Secondary | ICD-10-CM | POA: Diagnosis not present

## 2020-11-18 DIAGNOSIS — I639 Cerebral infarction, unspecified: Secondary | ICD-10-CM | POA: Diagnosis not present

## 2020-11-19 DIAGNOSIS — M6281 Muscle weakness (generalized): Secondary | ICD-10-CM | POA: Diagnosis not present

## 2020-11-19 DIAGNOSIS — R2689 Other abnormalities of gait and mobility: Secondary | ICD-10-CM | POA: Diagnosis not present

## 2020-11-19 DIAGNOSIS — I639 Cerebral infarction, unspecified: Secondary | ICD-10-CM | POA: Diagnosis not present

## 2020-11-19 DIAGNOSIS — R279 Unspecified lack of coordination: Secondary | ICD-10-CM | POA: Diagnosis not present

## 2020-11-19 DIAGNOSIS — M66852 Spontaneous rupture of other tendons, left thigh: Secondary | ICD-10-CM | POA: Diagnosis not present

## 2020-11-19 DIAGNOSIS — I480 Paroxysmal atrial fibrillation: Secondary | ICD-10-CM | POA: Diagnosis not present

## 2020-11-21 DIAGNOSIS — I480 Paroxysmal atrial fibrillation: Secondary | ICD-10-CM | POA: Diagnosis not present

## 2020-11-21 DIAGNOSIS — R279 Unspecified lack of coordination: Secondary | ICD-10-CM | POA: Diagnosis not present

## 2020-11-21 DIAGNOSIS — R2689 Other abnormalities of gait and mobility: Secondary | ICD-10-CM | POA: Diagnosis not present

## 2020-11-21 DIAGNOSIS — M6281 Muscle weakness (generalized): Secondary | ICD-10-CM | POA: Diagnosis not present

## 2020-11-21 DIAGNOSIS — M66852 Spontaneous rupture of other tendons, left thigh: Secondary | ICD-10-CM | POA: Diagnosis not present

## 2020-11-21 DIAGNOSIS — I639 Cerebral infarction, unspecified: Secondary | ICD-10-CM | POA: Diagnosis not present

## 2020-11-22 DIAGNOSIS — R279 Unspecified lack of coordination: Secondary | ICD-10-CM | POA: Diagnosis not present

## 2020-11-22 DIAGNOSIS — M6281 Muscle weakness (generalized): Secondary | ICD-10-CM | POA: Diagnosis not present

## 2020-11-22 DIAGNOSIS — I480 Paroxysmal atrial fibrillation: Secondary | ICD-10-CM | POA: Diagnosis not present

## 2020-11-22 DIAGNOSIS — M66852 Spontaneous rupture of other tendons, left thigh: Secondary | ICD-10-CM | POA: Diagnosis not present

## 2020-11-22 DIAGNOSIS — I639 Cerebral infarction, unspecified: Secondary | ICD-10-CM | POA: Diagnosis not present

## 2020-11-22 DIAGNOSIS — R2689 Other abnormalities of gait and mobility: Secondary | ICD-10-CM | POA: Diagnosis not present

## 2020-11-23 DIAGNOSIS — I639 Cerebral infarction, unspecified: Secondary | ICD-10-CM | POA: Diagnosis not present

## 2020-11-23 DIAGNOSIS — R2689 Other abnormalities of gait and mobility: Secondary | ICD-10-CM | POA: Diagnosis not present

## 2020-11-23 DIAGNOSIS — R279 Unspecified lack of coordination: Secondary | ICD-10-CM | POA: Diagnosis not present

## 2020-11-23 DIAGNOSIS — M66852 Spontaneous rupture of other tendons, left thigh: Secondary | ICD-10-CM | POA: Diagnosis not present

## 2020-11-23 DIAGNOSIS — M6281 Muscle weakness (generalized): Secondary | ICD-10-CM | POA: Diagnosis not present

## 2020-11-23 DIAGNOSIS — I480 Paroxysmal atrial fibrillation: Secondary | ICD-10-CM | POA: Diagnosis not present

## 2020-11-24 DIAGNOSIS — M6281 Muscle weakness (generalized): Secondary | ICD-10-CM | POA: Diagnosis not present

## 2020-11-24 DIAGNOSIS — R279 Unspecified lack of coordination: Secondary | ICD-10-CM | POA: Diagnosis not present

## 2020-11-24 DIAGNOSIS — I639 Cerebral infarction, unspecified: Secondary | ICD-10-CM | POA: Diagnosis not present

## 2020-11-24 DIAGNOSIS — I480 Paroxysmal atrial fibrillation: Secondary | ICD-10-CM | POA: Diagnosis not present

## 2020-11-24 DIAGNOSIS — R2689 Other abnormalities of gait and mobility: Secondary | ICD-10-CM | POA: Diagnosis not present

## 2020-11-24 DIAGNOSIS — M66852 Spontaneous rupture of other tendons, left thigh: Secondary | ICD-10-CM | POA: Diagnosis not present

## 2020-11-25 DIAGNOSIS — M66852 Spontaneous rupture of other tendons, left thigh: Secondary | ICD-10-CM | POA: Diagnosis not present

## 2020-11-25 DIAGNOSIS — I480 Paroxysmal atrial fibrillation: Secondary | ICD-10-CM | POA: Diagnosis not present

## 2020-11-25 DIAGNOSIS — M6281 Muscle weakness (generalized): Secondary | ICD-10-CM | POA: Diagnosis not present

## 2020-11-25 DIAGNOSIS — R279 Unspecified lack of coordination: Secondary | ICD-10-CM | POA: Diagnosis not present

## 2020-11-25 DIAGNOSIS — I639 Cerebral infarction, unspecified: Secondary | ICD-10-CM | POA: Diagnosis not present

## 2020-11-25 DIAGNOSIS — R2689 Other abnormalities of gait and mobility: Secondary | ICD-10-CM | POA: Diagnosis not present

## 2020-11-28 DIAGNOSIS — K5900 Constipation, unspecified: Secondary | ICD-10-CM | POA: Diagnosis not present

## 2020-11-28 DIAGNOSIS — K219 Gastro-esophageal reflux disease without esophagitis: Secondary | ICD-10-CM | POA: Diagnosis not present

## 2020-11-28 DIAGNOSIS — E1169 Type 2 diabetes mellitus with other specified complication: Secondary | ICD-10-CM | POA: Diagnosis not present

## 2020-11-28 DIAGNOSIS — E785 Hyperlipidemia, unspecified: Secondary | ICD-10-CM | POA: Diagnosis not present

## 2020-12-05 DIAGNOSIS — I480 Paroxysmal atrial fibrillation: Secondary | ICD-10-CM | POA: Diagnosis not present

## 2020-12-05 DIAGNOSIS — R279 Unspecified lack of coordination: Secondary | ICD-10-CM | POA: Diagnosis not present

## 2020-12-05 DIAGNOSIS — R2689 Other abnormalities of gait and mobility: Secondary | ICD-10-CM | POA: Diagnosis not present

## 2020-12-05 DIAGNOSIS — M66852 Spontaneous rupture of other tendons, left thigh: Secondary | ICD-10-CM | POA: Diagnosis not present

## 2020-12-05 DIAGNOSIS — I639 Cerebral infarction, unspecified: Secondary | ICD-10-CM | POA: Diagnosis not present

## 2020-12-05 DIAGNOSIS — M6281 Muscle weakness (generalized): Secondary | ICD-10-CM | POA: Diagnosis not present

## 2020-12-06 DIAGNOSIS — I639 Cerebral infarction, unspecified: Secondary | ICD-10-CM | POA: Diagnosis not present

## 2020-12-06 DIAGNOSIS — I480 Paroxysmal atrial fibrillation: Secondary | ICD-10-CM | POA: Diagnosis not present

## 2020-12-06 DIAGNOSIS — M66852 Spontaneous rupture of other tendons, left thigh: Secondary | ICD-10-CM | POA: Diagnosis not present

## 2020-12-06 DIAGNOSIS — M1711 Unilateral primary osteoarthritis, right knee: Secondary | ICD-10-CM | POA: Diagnosis not present

## 2020-12-06 DIAGNOSIS — R279 Unspecified lack of coordination: Secondary | ICD-10-CM | POA: Diagnosis not present

## 2020-12-06 DIAGNOSIS — R2689 Other abnormalities of gait and mobility: Secondary | ICD-10-CM | POA: Diagnosis not present

## 2020-12-06 DIAGNOSIS — M6281 Muscle weakness (generalized): Secondary | ICD-10-CM | POA: Diagnosis not present

## 2020-12-07 DIAGNOSIS — M6281 Muscle weakness (generalized): Secondary | ICD-10-CM | POA: Diagnosis not present

## 2020-12-07 DIAGNOSIS — I480 Paroxysmal atrial fibrillation: Secondary | ICD-10-CM | POA: Diagnosis not present

## 2020-12-07 DIAGNOSIS — R279 Unspecified lack of coordination: Secondary | ICD-10-CM | POA: Diagnosis not present

## 2020-12-07 DIAGNOSIS — R2689 Other abnormalities of gait and mobility: Secondary | ICD-10-CM | POA: Diagnosis not present

## 2020-12-07 DIAGNOSIS — I639 Cerebral infarction, unspecified: Secondary | ICD-10-CM | POA: Diagnosis not present

## 2020-12-07 DIAGNOSIS — M66852 Spontaneous rupture of other tendons, left thigh: Secondary | ICD-10-CM | POA: Diagnosis not present

## 2020-12-08 DIAGNOSIS — M66852 Spontaneous rupture of other tendons, left thigh: Secondary | ICD-10-CM | POA: Diagnosis not present

## 2020-12-08 DIAGNOSIS — I480 Paroxysmal atrial fibrillation: Secondary | ICD-10-CM | POA: Diagnosis not present

## 2020-12-08 DIAGNOSIS — R2689 Other abnormalities of gait and mobility: Secondary | ICD-10-CM | POA: Diagnosis not present

## 2020-12-08 DIAGNOSIS — I639 Cerebral infarction, unspecified: Secondary | ICD-10-CM | POA: Diagnosis not present

## 2020-12-08 DIAGNOSIS — R279 Unspecified lack of coordination: Secondary | ICD-10-CM | POA: Diagnosis not present

## 2020-12-08 DIAGNOSIS — M6281 Muscle weakness (generalized): Secondary | ICD-10-CM | POA: Diagnosis not present

## 2020-12-09 DIAGNOSIS — R2689 Other abnormalities of gait and mobility: Secondary | ICD-10-CM | POA: Diagnosis not present

## 2020-12-09 DIAGNOSIS — I480 Paroxysmal atrial fibrillation: Secondary | ICD-10-CM | POA: Diagnosis not present

## 2020-12-09 DIAGNOSIS — M66852 Spontaneous rupture of other tendons, left thigh: Secondary | ICD-10-CM | POA: Diagnosis not present

## 2020-12-09 DIAGNOSIS — M6281 Muscle weakness (generalized): Secondary | ICD-10-CM | POA: Diagnosis not present

## 2020-12-09 DIAGNOSIS — R279 Unspecified lack of coordination: Secondary | ICD-10-CM | POA: Diagnosis not present

## 2020-12-09 DIAGNOSIS — I639 Cerebral infarction, unspecified: Secondary | ICD-10-CM | POA: Diagnosis not present

## 2020-12-10 DIAGNOSIS — M66852 Spontaneous rupture of other tendons, left thigh: Secondary | ICD-10-CM | POA: Diagnosis not present

## 2020-12-10 DIAGNOSIS — R2689 Other abnormalities of gait and mobility: Secondary | ICD-10-CM | POA: Diagnosis not present

## 2020-12-10 DIAGNOSIS — R279 Unspecified lack of coordination: Secondary | ICD-10-CM | POA: Diagnosis not present

## 2020-12-10 DIAGNOSIS — M6281 Muscle weakness (generalized): Secondary | ICD-10-CM | POA: Diagnosis not present

## 2020-12-10 DIAGNOSIS — I639 Cerebral infarction, unspecified: Secondary | ICD-10-CM | POA: Diagnosis not present

## 2020-12-10 DIAGNOSIS — I480 Paroxysmal atrial fibrillation: Secondary | ICD-10-CM | POA: Diagnosis not present

## 2020-12-12 DIAGNOSIS — R2689 Other abnormalities of gait and mobility: Secondary | ICD-10-CM | POA: Diagnosis not present

## 2020-12-12 DIAGNOSIS — F32A Depression, unspecified: Secondary | ICD-10-CM | POA: Diagnosis not present

## 2020-12-12 DIAGNOSIS — Z79899 Other long term (current) drug therapy: Secondary | ICD-10-CM | POA: Diagnosis not present

## 2020-12-12 DIAGNOSIS — I639 Cerebral infarction, unspecified: Secondary | ICD-10-CM | POA: Diagnosis not present

## 2020-12-12 DIAGNOSIS — R279 Unspecified lack of coordination: Secondary | ICD-10-CM | POA: Diagnosis not present

## 2020-12-12 DIAGNOSIS — M6281 Muscle weakness (generalized): Secondary | ICD-10-CM | POA: Diagnosis not present

## 2020-12-12 DIAGNOSIS — I480 Paroxysmal atrial fibrillation: Secondary | ICD-10-CM | POA: Diagnosis not present

## 2020-12-12 DIAGNOSIS — M66852 Spontaneous rupture of other tendons, left thigh: Secondary | ICD-10-CM | POA: Diagnosis not present

## 2020-12-12 DIAGNOSIS — R451 Restlessness and agitation: Secondary | ICD-10-CM | POA: Diagnosis not present

## 2020-12-13 ENCOUNTER — Emergency Department (HOSPITAL_COMMUNITY): Payer: Medicare Other

## 2020-12-13 ENCOUNTER — Inpatient Hospital Stay (HOSPITAL_COMMUNITY)
Admission: EM | Admit: 2020-12-13 | Discharge: 2020-12-19 | DRG: 727 | Disposition: A | Discharge status: Skilled Nursing Facility | Payer: Medicare Other | Attending: Internal Medicine | Admitting: Internal Medicine

## 2020-12-13 ENCOUNTER — Encounter (HOSPITAL_COMMUNITY): Payer: Self-pay

## 2020-12-13 ENCOUNTER — Other Ambulatory Visit: Payer: Self-pay

## 2020-12-13 DIAGNOSIS — I69398 Other sequelae of cerebral infarction: Secondary | ICD-10-CM

## 2020-12-13 DIAGNOSIS — F0390 Unspecified dementia without behavioral disturbance: Secondary | ICD-10-CM | POA: Diagnosis present

## 2020-12-13 DIAGNOSIS — N390 Urinary tract infection, site not specified: Secondary | ICD-10-CM

## 2020-12-13 DIAGNOSIS — E1140 Type 2 diabetes mellitus with diabetic neuropathy, unspecified: Secondary | ICD-10-CM | POA: Diagnosis not present

## 2020-12-13 DIAGNOSIS — Z7951 Long term (current) use of inhaled steroids: Secondary | ICD-10-CM

## 2020-12-13 DIAGNOSIS — E1165 Type 2 diabetes mellitus with hyperglycemia: Secondary | ICD-10-CM | POA: Diagnosis not present

## 2020-12-13 DIAGNOSIS — I259 Chronic ischemic heart disease, unspecified: Secondary | ICD-10-CM | POA: Diagnosis present

## 2020-12-13 DIAGNOSIS — G4733 Obstructive sleep apnea (adult) (pediatric): Secondary | ICD-10-CM | POA: Diagnosis present

## 2020-12-13 DIAGNOSIS — R0902 Hypoxemia: Secondary | ICD-10-CM | POA: Diagnosis not present

## 2020-12-13 DIAGNOSIS — R069 Unspecified abnormalities of breathing: Secondary | ICD-10-CM | POA: Diagnosis not present

## 2020-12-13 DIAGNOSIS — G9341 Metabolic encephalopathy: Secondary | ICD-10-CM | POA: Diagnosis not present

## 2020-12-13 DIAGNOSIS — Z794 Long term (current) use of insulin: Secondary | ICD-10-CM

## 2020-12-13 DIAGNOSIS — Z87891 Personal history of nicotine dependence: Secondary | ICD-10-CM

## 2020-12-13 DIAGNOSIS — H5461 Unqualified visual loss, right eye, normal vision left eye: Secondary | ICD-10-CM | POA: Diagnosis present

## 2020-12-13 DIAGNOSIS — J9612 Chronic respiratory failure with hypercapnia: Secondary | ICD-10-CM | POA: Diagnosis not present

## 2020-12-13 DIAGNOSIS — I110 Hypertensive heart disease with heart failure: Secondary | ICD-10-CM | POA: Diagnosis present

## 2020-12-13 DIAGNOSIS — E669 Obesity, unspecified: Secondary | ICD-10-CM | POA: Diagnosis present

## 2020-12-13 DIAGNOSIS — R319 Hematuria, unspecified: Secondary | ICD-10-CM | POA: Diagnosis not present

## 2020-12-13 DIAGNOSIS — Z8616 Personal history of COVID-19: Secondary | ICD-10-CM

## 2020-12-13 DIAGNOSIS — I480 Paroxysmal atrial fibrillation: Secondary | ICD-10-CM | POA: Diagnosis present

## 2020-12-13 DIAGNOSIS — J9611 Chronic respiratory failure with hypoxia: Secondary | ICD-10-CM | POA: Diagnosis present

## 2020-12-13 DIAGNOSIS — I639 Cerebral infarction, unspecified: Secondary | ICD-10-CM | POA: Diagnosis present

## 2020-12-13 DIAGNOSIS — H5440 Blindness, one eye, unspecified eye: Secondary | ICD-10-CM | POA: Diagnosis present

## 2020-12-13 DIAGNOSIS — D649 Anemia, unspecified: Secondary | ICD-10-CM | POA: Diagnosis not present

## 2020-12-13 DIAGNOSIS — E785 Hyperlipidemia, unspecified: Secondary | ICD-10-CM | POA: Diagnosis not present

## 2020-12-13 DIAGNOSIS — B3749 Other urogenital candidiasis: Principal | ICD-10-CM | POA: Diagnosis present

## 2020-12-13 DIAGNOSIS — J449 Chronic obstructive pulmonary disease, unspecified: Secondary | ICD-10-CM | POA: Diagnosis present

## 2020-12-13 DIAGNOSIS — Z9119 Patient's noncompliance with other medical treatment and regimen: Secondary | ICD-10-CM | POA: Diagnosis not present

## 2020-12-13 DIAGNOSIS — R404 Transient alteration of awareness: Secondary | ICD-10-CM | POA: Diagnosis not present

## 2020-12-13 DIAGNOSIS — Z6832 Body mass index (BMI) 32.0-32.9, adult: Secondary | ICD-10-CM

## 2020-12-13 DIAGNOSIS — E118 Type 2 diabetes mellitus with unspecified complications: Secondary | ICD-10-CM | POA: Diagnosis not present

## 2020-12-13 DIAGNOSIS — I2510 Atherosclerotic heart disease of native coronary artery without angina pectoris: Secondary | ICD-10-CM | POA: Diagnosis present

## 2020-12-13 DIAGNOSIS — Z79890 Hormone replacement therapy (postmenopausal): Secondary | ICD-10-CM

## 2020-12-13 DIAGNOSIS — IMO0002 Reserved for concepts with insufficient information to code with codable children: Secondary | ICD-10-CM | POA: Diagnosis present

## 2020-12-13 DIAGNOSIS — Z79899 Other long term (current) drug therapy: Secondary | ICD-10-CM

## 2020-12-13 DIAGNOSIS — I10 Essential (primary) hypertension: Secondary | ICD-10-CM | POA: Diagnosis present

## 2020-12-13 DIAGNOSIS — G9340 Encephalopathy, unspecified: Secondary | ICD-10-CM

## 2020-12-13 DIAGNOSIS — F200 Paranoid schizophrenia: Secondary | ICD-10-CM | POA: Diagnosis present

## 2020-12-13 DIAGNOSIS — M1990 Unspecified osteoarthritis, unspecified site: Secondary | ICD-10-CM | POA: Diagnosis present

## 2020-12-13 DIAGNOSIS — G8929 Other chronic pain: Secondary | ICD-10-CM | POA: Diagnosis present

## 2020-12-13 DIAGNOSIS — Z7901 Long term (current) use of anticoagulants: Secondary | ICD-10-CM

## 2020-12-13 DIAGNOSIS — I5032 Chronic diastolic (congestive) heart failure: Secondary | ICD-10-CM | POA: Diagnosis not present

## 2020-12-13 DIAGNOSIS — Z9981 Dependence on supplemental oxygen: Secondary | ICD-10-CM | POA: Diagnosis not present

## 2020-12-13 DIAGNOSIS — Z7401 Bed confinement status: Secondary | ICD-10-CM | POA: Diagnosis not present

## 2020-12-13 DIAGNOSIS — Z743 Need for continuous supervision: Secondary | ICD-10-CM | POA: Diagnosis not present

## 2020-12-13 LAB — COMPREHENSIVE METABOLIC PANEL
ALT: 13 U/L (ref 0–44)
AST: 13 U/L — ABNORMAL LOW (ref 15–41)
Albumin: 3 g/dL — ABNORMAL LOW (ref 3.5–5.0)
Alkaline Phosphatase: 81 U/L (ref 38–126)
Anion gap: 8 (ref 5–15)
BUN: 8 mg/dL (ref 8–23)
CO2: 31 mmol/L (ref 22–32)
Calcium: 9.2 mg/dL (ref 8.9–10.3)
Chloride: 97 mmol/L — ABNORMAL LOW (ref 98–111)
Creatinine, Ser: 0.81 mg/dL (ref 0.61–1.24)
GFR, Estimated: >60 mL/min (ref >60)
Glucose, Bld: 270 mg/dL — ABNORMAL HIGH (ref 70–99)
Potassium: 4.5 mmol/L (ref 3.5–5.1)
Sodium: 136 mmol/L (ref 135–145)
Total Bilirubin: 0.3 mg/dL (ref 0.3–1.2)
Total Protein: 6.6 g/dL (ref 6.5–8.1)

## 2020-12-13 LAB — CBC WITH DIFFERENTIAL/PLATELET
Abs Immature Granulocytes: 0.03 10*3/uL (ref 0.00–0.07)
Basophils Absolute: 0.1 10*3/uL (ref 0.0–0.1)
Basophils Relative: 1 %
Eosinophils Absolute: 0.2 10*3/uL (ref 0.0–0.5)
Eosinophils Relative: 2 %
HCT: 41 % (ref 39.0–52.0)
Hemoglobin: 12.3 g/dL — ABNORMAL LOW (ref 13.0–17.0)
Immature Granulocytes: 0 %
Lymphocytes Relative: 20 %
Lymphs Abs: 2.5 10*3/uL (ref 0.7–4.0)
MCH: 24.8 pg — ABNORMAL LOW (ref 26.0–34.0)
MCHC: 30 g/dL (ref 30.0–36.0)
MCV: 82.7 fL (ref 80.0–100.0)
Monocytes Absolute: 0.8 10*3/uL (ref 0.1–1.0)
Monocytes Relative: 6 %
Neutro Abs: 8.9 10*3/uL — ABNORMAL HIGH (ref 1.7–7.7)
Neutrophils Relative %: 71 %
Platelets: 333 10*3/uL (ref 150–400)
RBC: 4.96 MIL/uL (ref 4.22–5.81)
RDW: 15.9 % — ABNORMAL HIGH (ref 11.5–15.5)
WBC: 12.5 10*3/uL — ABNORMAL HIGH (ref 4.0–10.5)
nRBC: 0 % (ref 0.0–0.2)

## 2020-12-13 LAB — URINALYSIS, ROUTINE W REFLEX MICROSCOPIC
Bacteria, UA: NONE SEEN
Bilirubin Urine: NEGATIVE
Glucose, UA: 150 mg/dL — AB
Ketones, ur: NEGATIVE mg/dL
Nitrite: NEGATIVE
Protein, ur: 100 mg/dL — AB
RBC / HPF: >50 RBC/hpf — ABNORMAL HIGH (ref 0–5)
Specific Gravity, Urine: 1.016 (ref 1.005–1.030)
WBC, UA: >50 WBC/hpf — ABNORMAL HIGH (ref 0–5)
pH: 6 (ref 5.0–8.0)

## 2020-12-13 LAB — GLUCOSE, CAPILLARY
Glucose-Capillary: 199 mg/dL — ABNORMAL HIGH (ref 70–99)
Glucose-Capillary: 200 mg/dL — ABNORMAL HIGH (ref 70–99)

## 2020-12-13 LAB — CBG MONITORING, ED: Glucose-Capillary: 216 mg/dL — ABNORMAL HIGH (ref 70–99)

## 2020-12-13 MED ORDER — POLYETHYLENE GLYCOL 3350 17 G PO PACK
17.0000 g | PACK | Freq: Every day | ORAL | Status: DC
  Administered 2020-12-13 – 2020-12-19 (×6): 17 g via ORAL
  Filled 2020-12-13 (×7): qty 1

## 2020-12-13 MED ORDER — FLUTICASONE-UMECLIDIN-VILANT 100-62.5-25 MCG/INH IN AEPB: 1.0000 | INHALATION_SPRAY | Freq: Every day | RESPIRATORY_TRACT | Status: DC

## 2020-12-13 MED ORDER — ONDANSETRON HCL 4 MG PO TABS: 4.0000 mg | ORAL_TABLET | Freq: Four times a day (QID) | ORAL | Status: DC | PRN

## 2020-12-13 MED ORDER — ACETAMINOPHEN 325 MG PO TABS
650.0000 mg | ORAL_TABLET | Freq: Four times a day (QID) | ORAL | Status: DC | PRN
  Administered 2020-12-16: 650 mg via ORAL
  Filled 2020-12-13: qty 2

## 2020-12-13 MED ORDER — LORAZEPAM 2 MG/ML IJ SOLN
0.5000 mg | Freq: Once | INTRAMUSCULAR | Status: AC
  Administered 2020-12-13: 0.5 mg via INTRAVENOUS
  Filled 2020-12-13: qty 1

## 2020-12-13 MED ORDER — QUETIAPINE FUMARATE 25 MG PO TABS
50.0000 mg | ORAL_TABLET | Freq: Every day | ORAL | Status: DC
  Administered 2020-12-13 – 2020-12-15 (×3): 50 mg via ORAL
  Filled 2020-12-13 (×3): qty 2

## 2020-12-13 MED ORDER — FLUTICASONE FUROATE-VILANTEROL 100-25 MCG/INH IN AEPB
1.0000 | INHALATION_SPRAY | Freq: Every day | RESPIRATORY_TRACT | Status: DC
  Administered 2020-12-16 – 2020-12-19 (×4): 1 via RESPIRATORY_TRACT
  Filled 2020-12-13: qty 28

## 2020-12-13 MED ORDER — PANTOPRAZOLE SODIUM 40 MG PO TBEC
40.0000 mg | DELAYED_RELEASE_TABLET | Freq: Every day | ORAL | Status: DC
  Administered 2020-12-13 – 2020-12-19 (×6): 40 mg via ORAL
  Filled 2020-12-13 (×7): qty 1

## 2020-12-13 MED ORDER — SENNOSIDES-DOCUSATE SODIUM 8.6-50 MG PO TABS
2.0000 | ORAL_TABLET | Freq: Two times a day (BID) | ORAL | Status: DC
  Administered 2020-12-13 – 2020-12-19 (×11): 2 via ORAL
  Filled 2020-12-13 (×12): qty 2

## 2020-12-13 MED ORDER — DULOXETINE HCL 30 MG PO CPEP
30.0000 mg | ORAL_CAPSULE | Freq: Every morning | ORAL | Status: DC
  Administered 2020-12-15 – 2020-12-19 (×5): 30 mg via ORAL
  Filled 2020-12-13 (×5): qty 1

## 2020-12-13 MED ORDER — GABAPENTIN 300 MG PO CAPS
300.0000 mg | ORAL_CAPSULE | Freq: Every day | ORAL | Status: DC
  Administered 2020-12-13 – 2020-12-18 (×6): 300 mg via ORAL
  Filled 2020-12-13 (×6): qty 1

## 2020-12-13 MED ORDER — APIXABAN 5 MG PO TABS
5.0000 mg | ORAL_TABLET | Freq: Two times a day (BID) | ORAL | Status: DC
  Administered 2020-12-13 – 2020-12-19 (×11): 5 mg via ORAL
  Filled 2020-12-13 (×12): qty 1

## 2020-12-13 MED ORDER — INSULIN ASPART 100 UNIT/ML ~~LOC~~ SOLN
0.0000 [IU] | Freq: Three times a day (TID) | SUBCUTANEOUS | Status: DC
  Administered 2020-12-14: 2 [IU] via SUBCUTANEOUS
  Administered 2020-12-14: 3 [IU] via SUBCUTANEOUS
  Administered 2020-12-14: 1 [IU] via SUBCUTANEOUS
  Administered 2020-12-15 – 2020-12-16 (×3): 2 [IU] via SUBCUTANEOUS
  Administered 2020-12-16 – 2020-12-17 (×3): 1 [IU] via SUBCUTANEOUS
  Administered 2020-12-17 – 2020-12-18 (×2): 2 [IU] via SUBCUTANEOUS
  Administered 2020-12-18 (×2): 1 [IU] via SUBCUTANEOUS
  Administered 2020-12-19: 2 [IU] via SUBCUTANEOUS
  Administered 2020-12-19: 3 [IU] via SUBCUTANEOUS

## 2020-12-13 MED ORDER — SODIUM CHLORIDE 0.9% FLUSH
3.0000 mL | Freq: Two times a day (BID) | INTRAVENOUS | Status: DC
  Administered 2020-12-14 – 2020-12-17 (×4): 3 mL via INTRAVENOUS

## 2020-12-13 MED ORDER — VITAMIN D 25 MCG (1000 UNIT) PO TABS
1000.0000 [IU] | ORAL_TABLET | Freq: Two times a day (BID) | ORAL | Status: DC
  Administered 2020-12-13 – 2020-12-19 (×11): 1000 [IU] via ORAL
  Filled 2020-12-13 (×12): qty 1

## 2020-12-13 MED ORDER — HYDROXYZINE HCL 25 MG PO TABS: 25.0000 mg | ORAL_TABLET | Freq: Two times a day (BID) | ORAL | Status: DC | PRN

## 2020-12-13 MED ORDER — SODIUM CHLORIDE 0.9 % IV SOLN: 250.0000 mL | INTRAVENOUS | Status: DC | PRN

## 2020-12-13 MED ORDER — BISACODYL 10 MG RE SUPP: 10.0000 mg | Freq: Every day | RECTAL | Status: DC | PRN

## 2020-12-13 MED ORDER — FLUCONAZOLE IN SODIUM CHLORIDE 200-0.9 MG/100ML-% IV SOLN
200.0000 mg | INTRAVENOUS | Status: AC
  Administered 2020-12-13 – 2020-12-15 (×3): 200 mg via INTRAVENOUS
  Filled 2020-12-13 (×3): qty 100

## 2020-12-13 MED ORDER — SODIUM CHLORIDE 0.9% FLUSH
3.0000 mL | Freq: Two times a day (BID) | INTRAVENOUS | Status: DC
  Administered 2020-12-14 – 2020-12-17 (×6): 3 mL via INTRAVENOUS

## 2020-12-13 MED ORDER — ALBUTEROL SULFATE (2.5 MG/3ML) 0.083% IN NEBU: 2.5000 mg | INHALATION_SOLUTION | Freq: Four times a day (QID) | RESPIRATORY_TRACT | Status: DC | PRN

## 2020-12-13 MED ORDER — UMECLIDINIUM BROMIDE 62.5 MCG/INH IN AEPB
1.0000 | INHALATION_SPRAY | Freq: Every day | RESPIRATORY_TRACT | Status: DC
  Administered 2020-12-16 – 2020-12-19 (×4): 1 via RESPIRATORY_TRACT
  Filled 2020-12-13: qty 7

## 2020-12-13 MED ORDER — SODIUM CHLORIDE 0.9% FLUSH
3.0000 mL | INTRAVENOUS | Status: DC | PRN
  Administered 2020-12-15: 3 mL via INTRAVENOUS

## 2020-12-13 MED ORDER — METOPROLOL TARTRATE 25 MG PO TABS
25.0000 mg | ORAL_TABLET | Freq: Two times a day (BID) | ORAL | Status: DC
  Administered 2020-12-13 – 2020-12-19 (×11): 25 mg via ORAL
  Filled 2020-12-13 (×12): qty 1

## 2020-12-13 MED ORDER — SODIUM CHLORIDE 0.9 % IV SOLN
1.0000 g | INTRAVENOUS | Status: DC
  Administered 2020-12-13 – 2020-12-17 (×5): 1 g via INTRAVENOUS
  Filled 2020-12-13 (×5): qty 10

## 2020-12-13 MED ORDER — ONDANSETRON HCL 4 MG/2ML IJ SOLN: 4.0000 mg | Freq: Four times a day (QID) | INTRAMUSCULAR | Status: DC | PRN

## 2020-12-13 MED ORDER — ROSUVASTATIN CALCIUM 10 MG PO TABS
10.0000 mg | ORAL_TABLET | Freq: Every day | ORAL | Status: DC
  Administered 2020-12-13 – 2020-12-19 (×6): 10 mg via ORAL
  Filled 2020-12-13 (×6): qty 1

## 2020-12-13 MED ORDER — LORAZEPAM 0.5 MG PO TABS
0.5000 mg | ORAL_TABLET | Freq: Three times a day (TID) | ORAL | Status: DC | PRN
  Administered 2020-12-14: 0.5 mg via ORAL
  Filled 2020-12-13: qty 1

## 2020-12-13 MED ORDER — ACETAMINOPHEN 650 MG RE SUPP
650.0000 mg | Freq: Four times a day (QID) | RECTAL | Status: DC | PRN
  Filled 2020-12-13: qty 1

## 2020-12-13 MED ORDER — SODIUM CHLORIDE 0.9 % IV SOLN: INTRAVENOUS | Status: AC

## 2020-12-13 MED ORDER — INSULIN GLARGINE 100 UNIT/ML ~~LOC~~ SOLN
10.0000 [IU] | Freq: Every day | SUBCUTANEOUS | Status: DC
  Administered 2020-12-13: 10 [IU] via SUBCUTANEOUS
  Filled 2020-12-13 (×2): qty 0.1

## 2020-12-13 MED ORDER — INSULIN ASPART 100 UNIT/ML ~~LOC~~ SOLN: 0.0000 [IU] | Freq: Every day | SUBCUTANEOUS | Status: DC

## 2020-12-13 NOTE — ED Triage Notes (Signed)
From [facility] via [employer] [facility], pt is combative, suspect UTI. Eating his own stool and combative per staff.

## 2020-12-13 NOTE — ED Provider Notes (Addendum)
[facility] EMERGENCY DEPARTMENT Provider Note   CSN: [id number] Arrival date & time: 12/13/20  [id number]     History Chief Complaint  Patient presents with  . Altered Mental Status    Rick Cruz is a 75 y.o. male.  HPI Level 5 caveat due to mental status change/dementia. Patient reportedly brought in for mental status change and being combative from [place].  Reportedly has been eating his own stool and combative.  Patient cannot tell me why he is here.  Although he does deny eating his own stool. Patient was Covid positive a month ago.    Past Medical History:  Diagnosis Date  . Arthritis   . 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 CHF (congestive heart failure) (HCC)    diastolic   . Chronic pain   . COPD (chronic obstructive pulmonary disease) ([place])   . Depression   . Diabetes mellitus without complication (HCC)    insulin pump  . Hypercholesterolemia   . Hypertension   . On home O2    3 liters  . Oxygen deficiency   . Schizophrenia ([place])   . Sleep apnea    noncompliant with BiPAP  . Sleep apnea   . Tobacco abuse     Patient Active Problem List   Diagnosis Date Noted  . CVA (cerebral vascular accident) ([place]) 10/12/2020  . Ischemic cerebrovascular accident (CVA) ([place]) 10/11/2020  . Hypoalbuminemia 10/11/2020  . Class 1 obesity 10/11/2020  . Iron deficiency anemia 10/11/2020  . Acute metabolic encephalopathy [id number]  . DKA, type 2, not at goal [facility]) 11/03/2019  . Heart failure ([place]) 02/02/2019  . Hyperglycemia   . Altered mental status 11/21/2018  . Diabetic foot ([place]) 07/23/2018  . Accidental fall 06/25/2018  . Supplemental oxygen dependent 06/25/2018  . 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 Neck pain 01/09/2018  . 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 Diabetic neuropathy ([place]) 12/13/2017  . Gastroesophageal reflux disease 12/13/2017  . Insomnia 07/30/2017  . Depression 07/19/2016  . OSA (obstructive sleep apnea)   . Chronic respiratory  failure with hypercapnia ([place])   . Undifferentiated schizophrenia ([place])   . Uncontrolled type 2 diabetes mellitus with complication ([place])   . Anemia of chronic disease   . Paroxysmal atrial fibrillation (HCC)   . Hyponatremia 06/12/2016  . Aspiration pneumonia ([place]) 08/16/2015  . Seborrheic keratosis 08/04/2015  . Pure hypercholesterolemia 07/05/2015  . Obesity hypoventilation syndrome ([place]) 04/05/2015  . CSA (central sleep apnea) 04/05/2015  . Non compliance with medical treatment 04/05/2015  . Type 2 diabetes mellitus with hyperglycemia ([place]) 02/28/2015  . Acute respiratory failure with hypoxia and hypercarbia (HCC)   . Chronic diastolic CHF (congestive heart failure) ([place]) 08/06/2014  . Recurrent falls 05/08/2014  . Ankle fracture, right 05/08/2014  . Ankle fracture, left 05/08/2014  . C. difficile colitis 01/04/2014  . Delirium 01/02/2014  . Acute encephalopathy 01/02/2014  . Morbid obesity ([place]) 06/17/2013  . Erosive esophagitis 04/22/2013  . Chronic respiratory failure 02 dep with hypercarbia 04/22/2013  . Type 2 diabetes mellitus without complication, with long-term current use of insulin ([place]) 04/21/2013  . Paranoid schizophrenia ([place]) 04/18/2013  . CAD (coronary artery disease) 04/18/2013  . DYSLIPIDEMIA 08/25/2008  . Obstructive sleep apnea 08/25/2008  . Essential hypertension 08/25/2008  . Dyslipidemia 08/25/2008    Past Surgical History:  Procedure Laterality Date  . COLON SURGERY  March 2010   secondary to large colon polyp, final path per discharge summary notes tubulovillous  adenoma.   . COLONOSCOPY  July 2011   Dr. Benson Norway: multiple polyps, internal and external hemorrhoids, diverticulosis. tubular adenoma  . ESOPHAGOGASTRODUODENOSCOPY (EGD) WITH PROPOFOL N/A 04/20/2013   Procedure: ESOPHAGOGASTRODUODENOSCOPY (EGD) WITH PROPOFOL;  Surgeon: Daneil Dolin, MD;  Location: [facility];  Service: Endoscopy;  Laterality: N/A;  . KNEE SURGERY     X2       Family History   Problem Relation Age of Onset  . Thyroid disease Mother   . Parkinson's disease Father   . Parkinson's disease Other   . Colon cancer Neg Hx     Social History   Tobacco Use  . Smoking status: Former Smoker    Packs/day: 1.00    Years: 50.00    Pack years: 50.00    Types: Cigarettes    Quit date: 09/16/2013    Years since quitting: 7.2  . Smokeless tobacco: Never Used  Substance Use Topics  . Alcohol use: No    Comment: history of ETOH abuse in remote past, none in at least 10 years  . Drug use: No    Home Medications Prior to Admission medications   Medication Sig Start Date End Date Taking? Authorizing Provider  acetaminophen (TYLENOL) 325 MG tablet Take 2 tablets (650 mg total) by mouth every 6 (six) hours as needed for mild pain, fever or headache (or Fever >/= 101). 02/22/20  Yes Emokpae, Courage, MD  albuterol (PROVENTIL) (2.5 MG/3ML) 0.083% nebulizer solution Take 3 mLs (2.5 mg total) by nebulization every 6 (six) hours as needed for wheezing or shortness of breath. 02/22/20  Yes Emokpae, Courage, MD  apixaban (ELIQUIS) 5 MG TABS tablet Take 1 tablet (5 mg total) by mouth 2 (two) times daily. 02/22/20  Yes Emokpae, Courage, MD  bacitracin-polymyxin b (POLYSPORIN) ointment Apply 1 application topically 2 (two) times daily.   Yes [provider]  carboxymethylcellulose (REFRESH PLUS) 0.5 % SOLN Apply 1 drop to eye 3 (three) times daily as needed.   Yes [provider]  cholecalciferol (VITAMIN D) 1000 UNITS tablet Take 1,000 Units by mouth 2 (two) times daily.   Yes [provider]  docusate sodium (COLACE) 100 MG capsule Take 100 mg by mouth 2 (two) times daily.   Yes [provider]  DULoxetine (CYMBALTA) 30 MG capsule Take 1 capsule (30 mg total) by mouth every morning. 02/22/20  Yes Emokpae, Courage, MD  Fluticasone-Umeclidin-Vilant (TRELEGY ELLIPTA) 100-62.5-25 MCG/INH AEPB Inhale 1 puff into the lungs daily. 02/22/20  Yes Emokpae, Courage, MD   gabapentin (NEURONTIN) 300 MG capsule Take 1 capsule (300 mg total) by mouth at bedtime. 10/18/20  Yes Eulogio Bear U, DO  hydrOXYzine (ATARAX/VISTARIL) 25 MG tablet Take 1 tablet (25 mg total) by mouth every 12 (twelve) hours as needed for anxiety or itching (Anxiety and insomnia or restlessness). 02/22/20  Yes Emokpae, Courage, MD  insulin aspart (NOVOLOG) 100 UNIT/ML injection Inject 0-15 Units into the skin 3 (three) times daily with meals. 10/18/20  Yes Vann, Jessica U, DO  insulin glargine (LANTUS SOLOSTAR) 100 UNIT/ML Solostar Pen Inject 20 Units into the skin at bedtime. 10/18/20  Yes Vann, Jessica U, DO  LORazepam (ATIVAN) 0.5 MG tablet Take 0.5 mg by mouth every 8 (eight) hours as needed for anxiety.   Yes [provider]  melatonin 3 MG TABS tablet Take 6 mg by mouth at bedtime.   Yes [provider]  metoprolol tartrate (LOPRESSOR) 25 MG tablet Take 1 tablet (25 mg total) by mouth 2 (  two) times daily. 10/18/20  Yes Geradine Girt, DO  Multiple Vitamins-Minerals (CENTRUM SILVER 50+MEN PO) Take 1 tablet by mouth daily.   Yes [provider]  Multiple Vitamins-Minerals (PRESERVISION AREDS 2+MULTI VIT PO) Take 2 tablets by mouth in the morning and at bedtime.   Yes [provider]  omeprazole (PRILOSEC) 20 MG capsule Take 20 mg by mouth 2 (two) times daily before a meal.   Yes [provider]  QUEtiapine (SEROQUEL) 50 MG tablet Take 1 tablet (50 mg total) by mouth at bedtime. 02/22/20  Yes Emokpae, Courage, MD  rosuvastatin (CRESTOR) 10 MG tablet Take 10 mg by mouth daily.   Yes [provider]  simvastatin (ZOCOR) 40 MG tablet Take 40 mg by mouth every evening.   Yes [provider]  tiotropium (SPIRIVA) 18 MCG inhalation capsule Place 1 capsule (18 mcg total) into inhaler and inhale daily. 02/22/20  Yes Emokpae, Courage, MD  triamcinolone cream (KENALOG) 0.1 % Apply 1 application topically 2 (two) times daily. 02/11/20  Yes Avegno, Darrelyn Hillock, FNP  TRULICITY 3 0000000 SOPN Inject 3 mg into the skin every Friday. 06/07/20  Yes [provider]  cephALEXin (KEFLEX) 500 MG capsule Take 1 capsule (500 mg total) by mouth 3 (three) times daily. Patient not taking: Reported on 12/13/2020 11/11/20   Ezequiel Essex, MD  OXYGEN Inhale 3 L into the lungs continuous.    [provider]  QUEtiapine (SEROQUEL) 25 MG tablet Take 1 tablet (25 mg total) by mouth in the morning. Patient not taking: No sig reported 02/22/20   Roxan Hockey, MD    Allergies    Phenergan [promethazine hcl], Haldol [haloperidol lactate], and Metformin and related  Review of Systems   Review of Systems  Unable to perform ROS: Mental status change    Physical Exam Updated Vital Signs BP (!) 168/82   Pulse (!) 106   Temp 97.9 F (36.6 C) (Rectal)   Resp 18   Ht 6' (1.829 m)   Wt 107.5 kg   SpO2 100%   BMI 32.14 kg/m   Physical Exam Vitals and nursing note reviewed.  HENT:     Head: Atraumatic.     Right Ear: External ear normal.     Left Ear: External ear normal.  Eyes:     Comments: Right eye held closed.  Cardiovascular:     Rate and Rhythm: Tachycardia present.  Pulmonary:     Breath sounds: No wheezing or rhonchi.  Abdominal:     Tenderness: There is no abdominal tenderness.  Musculoskeletal:        General: No tenderness.     Cervical back: Neck supple.     Right lower leg: Edema present.     Left lower leg: Edema present.  Skin:    General: Skin is warm.  Neurological:     Mental Status: He is alert.     Comments: Chronically weak on left.  Answers some questions but confused and is yelling.  Psychiatric:     Comments: Patient is yelling during attempted IV.     ED Results / Procedures / Treatments   Labs (all labs ordered are listed, but only abnormal results are displayed) Labs Reviewed  COMPREHENSIVE METABOLIC PANEL - Abnormal; Notable for the following components:      Result Value   Chloride 97 (*)     Glucose, Bld 270 (*)    Albumin 3.0 (*)    AST 13 (*)  All other components within normal limits  CBC WITH DIFFERENTIAL/PLATELET - Abnormal; Notable for the following components:   WBC 12.5 (*)    Hemoglobin 12.3 (*)    MCH 24.8 (*)    RDW 15.9 (*)    Neutro Abs 8.9 (*)    All other components within normal limits  URINALYSIS, ROUTINE W REFLEX MICROSCOPIC - Abnormal; Notable for the following components:   APPearance TURBID (*)    Glucose, UA 150 (*)    Hgb urine dipstick MODERATE (*)    Protein, ur 100 (*)    Leukocytes,Ua MODERATE (*)    RBC / HPF >50 (*)    WBC, UA >50 (*)    All other components within normal limits  URINE CULTURE  CBG MONITORING, ED    EKG None  Radiology CT Head Wo Contrast  Result Date: 12/13/2020 CLINICAL DATA:  Altered mental status. EXAM: CT HEAD WITHOUT CONTRAST TECHNIQUE: Contiguous axial images were obtained from the base of the skull through the vertex without intravenous contrast. COMPARISON:  November 10, 2020. FINDINGS: Brain: Mild diffuse cortical atrophy is noted. Mild chronic ischemic white matter disease is noted. No mass effect or midline shift is noted. Ventricular size is within normal limits. There is no evidence of mass lesion, hemorrhage or acute infarction. Vascular: No hyperdense vessel or unexpected calcification. Skull: Normal. Negative for fracture or focal lesion. Sinuses/Orbits: No acute finding. Other: None. IMPRESSION: Mild diffuse cortical atrophy. Mild chronic ischemic white matter disease. No acute intracranial abnormality seen. Electronically Signed   By: Marijo Conception M.D.   On: 12/13/2020 12:20    Procedures Procedures   Medications Ordered in ED Medications - No data to display  ED Course  I have reviewed the triage vital signs and the nursing notes.  Pertinent labs & imaging results that were available during my care of the patient were reviewed by me and considered in my medical decision making (see chart  for details).    MDM Rules/Calculators/A&P                          Patient brought in for mental status change.  Thought to be secondary to urinary tract infection.  Per nursing when they did the In-N-Out cath on the patient the urine appeared purulent.  Urinalysis does not have bacteria but does have leukocytes white cells and blood.  White count is elevated.  Urine cultures been sent.  With mental status change along with the urine findings I think this is likely urinary tract infection.  Patient was altered enough that he was eating stool at the nursing home I feel with mental status change the patient would benefit from admission to the hospital.  Will discuss with hospitalist. Final Clinical Impression(s) / ED Diagnoses Final diagnoses:  Encephalopathy  Urinary tract infection with hematuria, site unspecified    Rx / DC Orders ED Discharge Orders    None       Davonna Belling, MD 12/13/20 1310    Davonna Belling, MD 12/13/20 1312

## 2020-12-13 NOTE — H&P (Signed)
Patient Demographics:    Rick Cruz, is a 75 y.o. male  MRN: [id number]   DOB - [birth year]  Admit Date - 12/13/2020  Outpatient Primary MD for the patient is Vassie Moment, NP (Inactive)   Assessment & Plan:    Principal Problem:   Acute metabolic encephalopathy Active Problems:   Paroxysmal atrial fibrillation (HCC)   Essential hypertension   Paranoid schizophrenia ([place])   CAD (coronary artery disease)   Chronic diastolic CHF (congestive heart failure) ([place])   Uncontrolled type 2 diabetes mellitus with complication ([place])   Blind right eye   Chronic ischemic heart disease   Supplemental oxygen dependent   Ischemic cerebrovascular accident (CVA) ([place])  1) acute metabolic encephalopathy--- presumably due to UTI -UA with yeast -CT head without acute findings -worsening confusion and combativeness and bizarre behavior, as per staff from SNF patient apparently was eating his own stool -IV Rocephin and Diflucan as ordered pending urine and blood cultures - 2) possible sepsis--given possible UTI with tachycardia, leukocytosis and altered mentation -Await urine culture to see if patient rules in for sepsis -Management as above #1  3)DM2--A1c 6.7 reflecting excellent DM control PTA -Continue Lantus insulin and  Use Novolog/Humalog Sliding scale insulin with Accu-Cheks/Fingersticks as ordered  4)PAFib/H/o CVA--patient with history of prior stroke -acute right pontine infarct and 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  CHADsvasc score  greater than 5 ---continue Eliquis and Crestor for secondary stroke prophylaxis  5) chronic anemia--hemoglobin currently 12.3 which is close to patient's baseline no evidence of ongoing bleeding monitor closely as patient is on Eliquis  5) schizophrenia----worsening  confusion and combativeness and bizarre behavior, as per staff from SNF patient apparently was eating his own stool --- Seroquel, lorazepam, Cymbalta and hydroxyzine as ordered  6) recent COVID-19 infection----asymptomatic at this time from  COVID-19 standpoint   7) chronic hypoxic and hypercapnic respiratory failure--continue oxygen at 2 L via nasal cannula -Patient is typically not very compliant with CPAP --Continue bronchodilators  8) class II obesity and OSA---poorly compliant with CPAP -Low calorie diet, portion control and increase physical activity discussed with patient -Body mass index is 32.14 kg/m.   9)HFpEF--patient with history of chronic diastolic CHF -last known EF 55 to 60 % based on echo from January 2022  -Appears compensated , actually appears slightly dry -Gentle hydration, -Metoprolol as ordered   Disposition/Need for in-Hospital Stay- patient unable to be discharged at this time due to --increasing confusion and mental status changes from presumed UTI--needs IV fluids and IV antibiotics pending culture data*  Dispo: The patient is from: SNF              Anticipated d/c is to: SNF              Anticipated d/c date is: 1 day              Patient currently is not medically stable to d/c. Barriers: Not Clinically Stable-  With History of - Reviewed by me  Past Medical History:  Diagnosis Date  . Arthritis   . 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 CHF (congestive heart failure) (HCC)    diastolic   . Chronic pain   . COPD (chronic obstructive pulmonary disease) ([place])   . Depression   . Diabetes mellitus without complication (HCC)    insulin pump  . Hypercholesterolemia   . Hypertension   . On home O2    3 liters  . Oxygen deficiency   . Schizophrenia ([place])   . Sleep apnea    noncompliant with BiPAP  . Sleep apnea   . Tobacco abuse       Past Surgical History:  Procedure Laterality Date  . COLON SURGERY  March 2010   secondary to  large colon polyp, final path per discharge summary notes tubulovillous adenoma.   . COLONOSCOPY  July 2011   Dr. Benson Norway: multiple polyps, internal and external hemorrhoids, diverticulosis. tubular adenoma  . ESOPHAGOGASTRODUODENOSCOPY (EGD) WITH PROPOFOL N/A 04/20/2013   Procedure: ESOPHAGOGASTRODUODENOSCOPY (EGD) WITH PROPOFOL;  Surgeon: Daneil Dolin, MD;  Location: [facility];  Service: Endoscopy;  Laterality: N/A;  . KNEE SURGERY     X2      Chief Complaint  Patient presents with  . Altered Mental Status      HPI:    Rick Cruz  is a 75 y.o. 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, sleep apnea noncompliant with BiPAP, history of tobacco abuse, type II DM, hyperlipidemia, hypertension, history of unspecified schizophrenia who presents from [facility] due to concerns about worsening confusion and combativeness and bizarre behavior, as per staff from SNF patient apparently was eating his own stool -Patient tested positive for Covid more than 4 weeks ago -CT head  without acute findings -CMP remarkable for glucose of 270 creatinine is normal at 0.8 -CBC with a white count of 12.5  -Hemoglobin is 12.3 which is close to patient's baseline -UA suggestive of UTI,, UA also has yeast -EDP requested hospitalization for presumed UTI with altered mentation -Patient is a very poor historian but apparently no vomiting or diarrhea, and no fevers   Review of systems:    In addition to the HPI above,   A full Review of  Systems was done, all other systems reviewed are negative except as noted above in HPI , .   Social History:  Reviewed by me    Social History   Tobacco Use  . Smoking status: Former Smoker    Packs/day: 1.00    Years: 50.00    Pack years: 50.00    Types: Cigarettes    Quit date: 09/16/2013    Years since  quitting: 7.2  . Smokeless tobacco: Never Used  Substance Use Topics  . Alcohol use: No    Comment: history of ETOH abuse in remote past, none in at least 10 years     Family History :  Reviewed by me    Family History  Problem Relation Age of Onset  . Thyroid disease Mother   . Parkinson's disease Father   . Parkinson's disease Other   . Colon cancer Neg Hx      Home Medications:   Prior to Admission medications   Medication Sig Start Date End Date Taking? Authorizing Provider  acetaminophen (TYLENOL) 325 MG tablet Take  2 tablets (650 mg total) by mouth every 6 (six) hours as needed for mild pain, fever or headache (or Fever >/= 101). 02/22/20  Yes Coda Mathey, MD  albuterol (PROVENTIL) (2.5 MG/3ML) 0.083% nebulizer solution Take 3 mLs (2.5 mg total) by nebulization every 6 (six) hours as needed for wheezing or shortness of breath. 02/22/20  Yes Shante Archambeault, MD  apixaban (ELIQUIS) 5 MG TABS tablet Take 1 tablet (5 mg total) by mouth 2 (two) times daily. 02/22/20  Yes Doyl Bitting, MD  bacitracin-polymyxin b (POLYSPORIN) ointment Apply 1 application topically 2 (two) times daily.   Yes [provider]  carboxymethylcellulose (REFRESH PLUS) 0.5 % SOLN Apply 1 drop to eye 3 (three) times daily as needed.   Yes [provider]  cholecalciferol (VITAMIN D) 1000 UNITS tablet Take 1,000 Units by mouth 2 (two) times daily.   Yes [provider]  docusate sodium (COLACE) 100 MG capsule Take 100 mg by mouth 2 (two) times daily.   Yes [provider]  DULoxetine (CYMBALTA) 30 MG capsule Take 1 capsule (30 mg total) by mouth every morning. 02/22/20  Yes Kekoa Fyock, MD  Fluticasone-Umeclidin-Vilant (TRELEGY ELLIPTA) 100-62.5-25 MCG/INH AEPB Inhale 1 puff into the lungs daily. 02/22/20  Yes Solina Heron, MD  gabapentin (NEURONTIN) 300 MG capsule Take 1 capsule (300 mg total) by mouth at bedtime. 10/18/20  Yes Eulogio Bear U, DO  hydrOXYzine  (ATARAX/VISTARIL) 25 MG tablet Take 1 tablet (25 mg total) by mouth every 12 (twelve) hours as needed for anxiety or itching (Anxiety and insomnia or restlessness). 02/22/20  Yes Ladarious Kresse, MD  insulin aspart (NOVOLOG) 100 UNIT/ML injection Inject 0-15 Units into the skin 3 (three) times daily with meals. 10/18/20  Yes Vann, Jessica U, DO  insulin glargine (LANTUS SOLOSTAR) 100 UNIT/ML Solostar Pen Inject 20 Units into the skin at bedtime. 10/18/20  Yes Vann, Jessica U, DO  LORazepam (ATIVAN) 0.5 MG tablet Take 0.5 mg by mouth every 8 (eight) hours as needed for anxiety.   Yes [provider]  melatonin 3 MG TABS tablet Take 6 mg by mouth at bedtime.   Yes [provider]  metoprolol tartrate (LOPRESSOR) 25 MG tablet Take 1 tablet (25 mg total) by mouth 2 (two) times daily. 10/18/20  Yes Geradine Girt, DO  Multiple Vitamins-Minerals (CENTRUM SILVER 50+MEN PO) Take 1 tablet by mouth daily.   Yes [provider]  Multiple Vitamins-Minerals (PRESERVISION AREDS 2+MULTI VIT PO) Take 2 tablets by mouth in the morning and at bedtime.   Yes [provider]  omeprazole (PRILOSEC) 20 MG capsule Take 20 mg by mouth 2 (two) times daily before a meal.   Yes [provider]  QUEtiapine (SEROQUEL) 50 MG tablet Take 1 tablet (50 mg total) by mouth at bedtime. 02/22/20  Yes Tamkia Temples, MD  rosuvastatin (CRESTOR) 10 MG tablet Take 10 mg by mouth daily.   Yes [provider]  simvastatin (ZOCOR) 40 MG tablet Take 40 mg by mouth every evening.   Yes [provider]  tiotropium (SPIRIVA) 18 MCG inhalation capsule Place 1 capsule (18 mcg total) into inhaler and inhale daily. 02/22/20  Yes Lorelei Heikkila, MD  triamcinolone cream (KENALOG) 0.1 % Apply 1 application topically 2 (two) times daily. 02/11/20  Yes Avegno, Darrelyn Hillock, FNP  TRULICITY 3 0000000 SOPN Inject 3 mg into the skin every Friday. 06/07/20  Yes [provider]  cephALEXin (KEFLEX)  500 MG capsule Take 1 capsule (500 mg total)  by mouth 3 (three) times daily. Patient not taking: Reported on 12/13/2020 11/11/20   Ezequiel Essex, MD  OXYGEN Inhale 3 L into the lungs continuous.    [provider]  QUEtiapine (SEROQUEL) 25 MG tablet Take 1 tablet (25 mg total) by mouth in the morning. Patient not taking: No sig reported 02/22/20   Roxan Hockey, MD     Allergies:     Allergies  Allergen Reactions  . Phenergan [Promethazine Hcl] Other (See Comments)    Becomes very confused and aggressive  . Haldol [Haloperidol Lactate] Other (See Comments)    Shaking   . Metformin And Related Diarrhea     Physical Exam:   Vitals  Blood pressure (!) 150/103, pulse 67, temperature 97.9 F (36.6 C), temperature source Rectal, resp. rate 18, height 6' (1.829 m), weight 107.5 kg, SpO2 94 %.  Physical Examination: General appearance -awake, confused and disoriented, and in no distress Nose- [place] 2L/min Mental status -disoriented and confused, becomes agitated with exam Eyes - sclera anicteric Neck - supple, no JVD elevation , Chest - clear  to auscultation bilaterally, symmetrical air movement,  Heart - S1 and S2 normal, irregularly irregular Abdomen - soft, nontender, nondistended, no masses or organomegaly Neurological -no obvious new neuro deficits, globally confused and disoriented extremities - no pedal edema noted, intact peripheral pulses  Skin - warm, dry   Data Review:    CBC Recent Labs  Lab 12/13/20 0719  WBC 12.5*  HGB 12.3*  HCT 41.0  PLT 333  MCV 82.7  MCH 24.8*  MCHC 30.0  RDW 15.9*  LYMPHSABS 2.5  MONOABS 0.8  EOSABS 0.2  BASOSABS 0.1   Chemistries  Recent Labs  Lab 12/13/20 0719  NA 136  K 4.5  CL 97*  CO2 31  GLUCOSE 270*  BUN 8  CREATININE 0.81  CALCIUM 9.2  AST 13*  ALT 13  ALKPHOS 81  BILITOT 0.3   estimated creatinine clearance is 101.4 mL/min (by C-G formula based on SCr of 0.81  mg/dL). ------------------------------------------------------------------------------------------------------------------ No results for input(s): TSH, T4TOTAL, T3FREE, THYROIDAB in the last 72 hours.  Invalid input(s): FREET3   Coagulation profile No results for input(s): INR, PROTIME in the last 168 hours. ------------------------------------------------------------------------------------------------------------------- No results for input(s): DDIMER in the last 72 hours.  Cardiac Enzymes No results for input(s): CKMB, TROPONINI, MYOGLOBIN in the last 168 hours.  Invalid input(s): CK ------------------------------------------------------------------------------------------------------------------    Component Value Date/Time   BNP 29.0 11/21/2018 1601    Urinalysis    Component Value Date/Time   COLORURINE YELLOW 12/13/2020 1004   APPEARANCEUR TURBID (A) 12/13/2020 1004   LABSPEC 1.016 12/13/2020 1004   PHURINE 6.0 12/13/2020 1004   GLUCOSEU 150 (A) 12/13/2020 1004   HGBUR MODERATE (A) 12/13/2020 1004   BILIRUBINUR NEGATIVE 12/13/2020 1004   KETONESUR NEGATIVE 12/13/2020 1004   PROTEINUR 100 (A) 12/13/2020 1004   UROBILINOGEN 1.0 12/05/2014 1519   NITRITE NEGATIVE 12/13/2020 1004   LEUKOCYTESUR MODERATE (A) 12/13/2020 1004     Imaging Results:    CT Head Wo Contrast  Result Date: 12/13/2020 CLINICAL DATA:  Altered mental status. EXAM: CT HEAD WITHOUT CONTRAST TECHNIQUE: Contiguous axial images were obtained from the base of the skull through the vertex without intravenous contrast. COMPARISON:  November 10, 2020. FINDINGS: Brain: Mild diffuse cortical atrophy is noted. Mild chronic ischemic white matter disease is noted. No mass effect or midline shift is noted. Ventricular size is within normal limits. There is no evidence of mass lesion,  hemorrhage or acute infarction. Vascular: No hyperdense vessel or unexpected calcification. Skull: Normal. Negative for fracture or  focal lesion. Sinuses/Orbits: No acute finding. Other: None. IMPRESSION: Mild diffuse cortical atrophy. Mild chronic ischemic white matter disease. No acute intracranial abnormality seen. Electronically Signed   By: Marijo Conception M.D.   On: 12/13/2020 12:20   Radiological Exams on Admission: CT Head Wo Contrast  Result Date: 12/13/2020 CLINICAL DATA:  Altered mental status. EXAM: CT HEAD WITHOUT CONTRAST TECHNIQUE: Contiguous axial images were obtained from the base of the skull through the vertex without intravenous contrast. COMPARISON:  November 10, 2020. FINDINGS: Brain: Mild diffuse cortical atrophy is noted. Mild chronic ischemic white matter disease is noted. No mass effect or midline shift is noted. Ventricular size is within normal limits. There is no evidence of mass lesion, hemorrhage or acute infarction. Vascular: No hyperdense vessel or unexpected calcification. Skull: Normal. Negative for fracture or focal lesion. Sinuses/Orbits: No acute finding. Other: None. IMPRESSION: Mild diffuse cortical atrophy. Mild chronic ischemic white matter disease. No acute intracranial abnormality seen. Electronically Signed   By: Marijo Conception M.D.   On: 12/13/2020 12:20   DVT Prophylaxis -SCD /Eliquis AM Labs Ordered, also please review Full Orders  Family Communication: Admission, patients condition and plan of care including tests being ordered have been discussed with the patient and wife who indicate understanding and agree with the plan   Code Status - Full Code  Likely DC to SNF after mental status improves  Condition   Stable   Roxan Hockey M.D on 12/13/2020 at 6:28 PM Go to [email or web address] -  for contact info  [facility] - Office  [phone]

## 2020-12-13 NOTE — ED Notes (Signed)
No adverse reaction to antibiotic noted

## 2020-12-13 NOTE — ED Notes (Signed)
Per lab: unable to obtain second blood culture at pt is combative and hitting at lab tech. MD notified

## 2020-12-13 NOTE — Progress Notes (Signed)
8:22PM RN called due to pt. being combative and uncooperative with oral intake meds. IV Ativan 0.'5mg'$  in place of the oral Ativan was given x 1.

## 2020-12-13 NOTE — ED Notes (Signed)
Patient transported to X-ray 

## 2020-12-14 DIAGNOSIS — I259 Chronic ischemic heart disease, unspecified: Secondary | ICD-10-CM | POA: Diagnosis not present

## 2020-12-14 DIAGNOSIS — G4733 Obstructive sleep apnea (adult) (pediatric): Secondary | ICD-10-CM | POA: Diagnosis present

## 2020-12-14 DIAGNOSIS — I69398 Other sequelae of cerebral infarction: Secondary | ICD-10-CM | POA: Diagnosis not present

## 2020-12-14 DIAGNOSIS — R319 Hematuria, unspecified: Secondary | ICD-10-CM

## 2020-12-14 DIAGNOSIS — G9341 Metabolic encephalopathy: Secondary | ICD-10-CM | POA: Diagnosis present

## 2020-12-14 DIAGNOSIS — Z743 Need for continuous supervision: Secondary | ICD-10-CM | POA: Diagnosis not present

## 2020-12-14 DIAGNOSIS — M1990 Unspecified osteoarthritis, unspecified site: Secondary | ICD-10-CM | POA: Diagnosis present

## 2020-12-14 DIAGNOSIS — G9340 Encephalopathy, unspecified: Secondary | ICD-10-CM | POA: Diagnosis present

## 2020-12-14 DIAGNOSIS — I639 Cerebral infarction, unspecified: Secondary | ICD-10-CM

## 2020-12-14 DIAGNOSIS — N390 Urinary tract infection, site not specified: Secondary | ICD-10-CM | POA: Diagnosis not present

## 2020-12-14 DIAGNOSIS — I2510 Atherosclerotic heart disease of native coronary artery without angina pectoris: Secondary | ICD-10-CM | POA: Diagnosis present

## 2020-12-14 DIAGNOSIS — F0390 Unspecified dementia without behavioral disturbance: Secondary | ICD-10-CM | POA: Diagnosis present

## 2020-12-14 DIAGNOSIS — J449 Chronic obstructive pulmonary disease, unspecified: Secondary | ICD-10-CM | POA: Diagnosis present

## 2020-12-14 DIAGNOSIS — I480 Paroxysmal atrial fibrillation: Secondary | ICD-10-CM

## 2020-12-14 DIAGNOSIS — F200 Paranoid schizophrenia: Secondary | ICD-10-CM

## 2020-12-14 DIAGNOSIS — Z9981 Dependence on supplemental oxygen: Secondary | ICD-10-CM

## 2020-12-14 DIAGNOSIS — E1140 Type 2 diabetes mellitus with diabetic neuropathy, unspecified: Secondary | ICD-10-CM | POA: Diagnosis present

## 2020-12-14 DIAGNOSIS — E785 Hyperlipidemia, unspecified: Secondary | ICD-10-CM | POA: Diagnosis present

## 2020-12-14 DIAGNOSIS — I5032 Chronic diastolic (congestive) heart failure: Secondary | ICD-10-CM | POA: Diagnosis present

## 2020-12-14 DIAGNOSIS — I10 Essential (primary) hypertension: Secondary | ICD-10-CM | POA: Diagnosis not present

## 2020-12-14 DIAGNOSIS — E1165 Type 2 diabetes mellitus with hyperglycemia: Secondary | ICD-10-CM | POA: Diagnosis present

## 2020-12-14 DIAGNOSIS — E118 Type 2 diabetes mellitus with unspecified complications: Secondary | ICD-10-CM

## 2020-12-14 DIAGNOSIS — J9611 Chronic respiratory failure with hypoxia: Secondary | ICD-10-CM | POA: Diagnosis present

## 2020-12-14 DIAGNOSIS — B3749 Other urogenital candidiasis: Secondary | ICD-10-CM | POA: Diagnosis present

## 2020-12-14 DIAGNOSIS — R069 Unspecified abnormalities of breathing: Secondary | ICD-10-CM | POA: Diagnosis not present

## 2020-12-14 DIAGNOSIS — D649 Anemia, unspecified: Secondary | ICD-10-CM | POA: Diagnosis present

## 2020-12-14 DIAGNOSIS — Z9119 Patient's noncompliance with other medical treatment and regimen: Secondary | ICD-10-CM | POA: Diagnosis not present

## 2020-12-14 DIAGNOSIS — E669 Obesity, unspecified: Secondary | ICD-10-CM | POA: Diagnosis present

## 2020-12-14 DIAGNOSIS — Z6832 Body mass index (BMI) 32.0-32.9, adult: Secondary | ICD-10-CM | POA: Diagnosis not present

## 2020-12-14 DIAGNOSIS — G8929 Other chronic pain: Secondary | ICD-10-CM | POA: Diagnosis present

## 2020-12-14 DIAGNOSIS — R0902 Hypoxemia: Secondary | ICD-10-CM | POA: Diagnosis not present

## 2020-12-14 DIAGNOSIS — H5461 Unqualified visual loss, right eye, normal vision left eye: Secondary | ICD-10-CM | POA: Diagnosis present

## 2020-12-14 DIAGNOSIS — J9612 Chronic respiratory failure with hypercapnia: Secondary | ICD-10-CM | POA: Diagnosis present

## 2020-12-14 DIAGNOSIS — I110 Hypertensive heart disease with heart failure: Secondary | ICD-10-CM | POA: Diagnosis present

## 2020-12-14 DIAGNOSIS — Z7401 Bed confinement status: Secondary | ICD-10-CM | POA: Diagnosis not present

## 2020-12-14 LAB — GLUCOSE, CAPILLARY
Glucose-Capillary: 206 mg/dL — ABNORMAL HIGH (ref 70–99)
Glucose-Capillary: 163 mg/dL — ABNORMAL HIGH (ref 70–99)
Glucose-Capillary: 145 mg/dL — ABNORMAL HIGH (ref 70–99)
Glucose-Capillary: 155 mg/dL — ABNORMAL HIGH (ref 70–99)

## 2020-12-14 LAB — HEMOGLOBIN A1C
Hgb A1c MFr Bld: 8.6 % — ABNORMAL HIGH (ref 4.8–5.6)
Mean Plasma Glucose: 200.12 mg/dL

## 2020-12-14 LAB — URINE CULTURE

## 2020-12-14 LAB — BASIC METABOLIC PANEL
Anion gap: 9 (ref 5–15)
BUN: 10 mg/dL (ref 8–23)
CO2: 28 mmol/L (ref 22–32)
Calcium: 9.2 mg/dL (ref 8.9–10.3)
Chloride: 100 mmol/L (ref 98–111)
Creatinine, Ser: 0.9 mg/dL (ref 0.61–1.24)
GFR, Estimated: >60 mL/min (ref >60)
Glucose, Bld: 215 mg/dL — ABNORMAL HIGH (ref 70–99)
Potassium: 4.7 mmol/L (ref 3.5–5.1)
Sodium: 137 mmol/L (ref 135–145)

## 2020-12-14 LAB — CBC
HCT: 39.7 % (ref 39.0–52.0)
Hemoglobin: 11.8 g/dL — ABNORMAL LOW (ref 13.0–17.0)
MCH: 24.4 pg — ABNORMAL LOW (ref 26.0–34.0)
MCHC: 29.7 g/dL — ABNORMAL LOW (ref 30.0–36.0)
MCV: 82.2 fL (ref 80.0–100.0)
Platelets: 295 10*3/uL (ref 150–400)
RBC: 4.83 MIL/uL (ref 4.22–5.81)
RDW: 16.1 % — ABNORMAL HIGH (ref 11.5–15.5)
WBC: 9.6 10*3/uL (ref 4.0–10.5)
nRBC: 0 % (ref 0.0–0.2)

## 2020-12-14 LAB — BLOOD GAS, ARTERIAL
Acid-Base Excess: 5.8 mmol/L — ABNORMAL HIGH (ref 0.0–2.0)
Bicarbonate: 29.2 mmol/L — ABNORMAL HIGH (ref 20.0–28.0)
FIO2: 21
O2 Saturation: 88.5 %
Patient temperature: 37
pCO2 arterial: 44.3 mmHg (ref 32.0–48.0)
pH, Arterial: 7.445 (ref 7.350–7.450)
pO2, Arterial: 56.4 mmHg — ABNORMAL LOW (ref 83.0–108.0)

## 2020-12-14 MED ORDER — SODIUM CHLORIDE 0.9 % IV SOLN: INTRAVENOUS | Status: AC

## 2020-12-14 MED ORDER — INSULIN GLARGINE 100 UNIT/ML ~~LOC~~ SOLN
15.0000 [IU] | Freq: Every day | SUBCUTANEOUS | Status: DC
  Administered 2020-12-14 – 2020-12-18 (×5): 15 [IU] via SUBCUTANEOUS
  Filled 2020-12-14 (×6): qty 0.15

## 2020-12-14 MED ORDER — HALOPERIDOL LACTATE 5 MG/ML IJ SOLN: 0.5000 mg | Freq: Three times a day (TID) | INTRAMUSCULAR | Status: DC | PRN

## 2020-12-14 NOTE — Progress Notes (Signed)
Inpatient Diabetes Program Recommendations  AACE/ADA: New Consensus Statement on Inpatient Glycemic Control  Target Ranges:  Prepandial:   less than 140 mg/dL      Peak postprandial:   less than 180 mg/dL (1-2 hours)      Critically ill patients:  140 - 180 mg/dL   Results for NEITHAN, GEHR" (MRN [id number]) as of 12/14/2020 09:26  Ref. Range 12/13/2020 13:35 12/13/2020 18:08 12/13/2020 22:30 12/14/2020 07:15  Glucose-Capillary Latest Ref Range: 70 - 99 mg/dL 216 (H) 199 (H) 200 (H) 206 (H)   Review of Glycemic Control  Diabetes history: DM2 Outpatient Diabetes medications: Lantus 20 units QAM, Novolog 0-15 units TID with meals, Trulicity 3 mg Qweek (Friday) Current orders for Inpatient glycemic control: Lantus 10  Units daily, Novolog 0-9 units TID with meals, Novolog 0-5 units QHS  Inpatient Diabetes Program Recommendations:    Insulin: Please consider increasing Lantus to 15 units daily.  Thanks, Barnie Alderman, RN, MSN, CDE Diabetes Coordinator Inpatient Diabetes Program [phone] (Team Pager from 8am to 5pm)

## 2020-12-14 NOTE — Progress Notes (Signed)
PROGRESS NOTE    Rick Cruz  [id number] DOB: March 31, 1946 DOA: 12/13/2020 PCP: Vassie Moment, NP (Inactive)    Chief Complaint  Patient presents with  . Altered Mental Status    Brief admission Narrative:  As per H&P written by Dr. Denton Brick on 12/13/2020  Rick Cruz  is a 75 y.o. 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, sleep apnea noncompliant with BiPAP, history of tobacco abuse, type II DM, hyperlipidemia, hypertension, history of unspecified schizophrenia who presents from [facility] due to concerns about worsening confusion and combativeness and bizarre behavior, as per staff from SNF patient apparently was eating his own stool -Patient tested positive for Covid more than 4 weeks ago -CT head  without acute findings -CMP remarkable for glucose of 270 creatinine is normal at 0.8 -CBC with a white count of 12.5  -Hemoglobin is 12.3 which is close to patient's baseline -UA suggestive of UTI,, UA also has yeast -EDP requested hospitalization for presumed UTI with altered mentation -Patient is a very poor historian but apparently no vomiting or diarrhea, and no fevers  Assessment & Plan: Acute metabolic encephalopathy -In the setting of presumed UTI -Follow culture results -Continue current antibiotic -Patient is afebrile -Still experiencing altered mental status, agitation and combative behavior. -Minimize the use of medications that can alter mentation.  UTI -Cultures pending -Continue empirical Rocephin and Diflucan  Essential hypertension -Stable overall -Continue current antihypertensive agents.  Paranoid schizophrenia ([place]) -Planning to resume home psychiatric medications when able to tolerate by mouth -Currently requiring the use of mittens and intermittent use of lorazepam due to ongoing  agitation/combative behavior.  CAD (coronary artery disease) -No chest pain,  Chronic diastolic CHF (congestive heart failure) (HCC) -Continue holding diuretics currently -Follow daily weights and strict I's and O -Gentle hydration provided -Will discuss low-sodium diet when fully alert and able to follow commands/instructions.  Uncontrolled type 2 diabetes mellitus with complication (HCC) -Follow CBGs and adjust hypoglycemic regimen as needed -Continue sliding scale insulin and Lantus. -Dose of hypoglycemic agent has been adjusted in the setting of poor oral intake.  Paroxysmal atrial fibrillation (HCC) -Continue Eliquis for secondary prevention -Rate control at this time.  Class II obesity, chronic hypoxic and hypercapnic respiratory failure and obstructive sleep apnea -Continue home oxygen supplementation -Patient noncompliant with CPAP -Continue as needed bronchodilators. -Body mass index is 32.14 kg/m. -Will check ABG  Ischemic cerebrovascular accident (CVA) ([place]) -Continue Eliquis for secondary prevention -CT scan of the head on admission not demonstrating acute intracranial abnormalities.   DVT prophylaxis: Eliquis Code Status: Full Code. Family Communication: No family at bedside; I called the wife over the phone and left a voicemail message, unable to reach her directly. 12/14/20 Disposition:   Status is: Inpatient  Dispo: The patient is from: SNF              Anticipated d/c is to: Skilled nursing facility              Patient currently is not medically stable for discharge; continue to experience altered mentation, agitation and combative behavior.  Continue current antibiotics, IV fluids and supportive care.  Follow culture results and clinical response.   Difficult to place patient no    Consultants:   None   Procedures:  See below for x-ray reports.   Antimicrobials:  Rocephin Diflucan    Subjective: Afebrile currently; with ongoing agitation  and combative behavior; mentation now back to baseline.  During my examination somnolent/lethargic demonstrating inability to take p.o.'s.  Objective: Vitals:   12/13/20 1300 12/13/20 1430 12/14/20 0540 12/14/20 1308  BP: (!) 168/82 (!) 150/103 119/76 126/85  Pulse: (!) 106 67 (!) 109 (!) 110  Resp: '18  18 20  '$ Temp:   (!) 97.5 F (36.4 C) 99 F (37.2 C)  TempSrc:    Oral  SpO2: 100% 94% 93% 90%  Weight:      Height:        Intake/Output Summary (Last 24 hours) at 12/14/2020 1509 Last data filed at 12/14/2020 0500 Gross per 24 hour  Intake 283.26 ml  Output --  Net 283.26 ml   Filed Weights   12/13/20 0701  Weight: 107.5 kg    Examination:  General exam: Somnolent/lethargic; overnight reported by nursing staff ongoing agitation and combative behavior.  Patient is currently afebrile.  Mittens in place. Respiratory system: Normal respiratory effort; no wheezing or crackles on exam.  Oxygen supplementation in place (chronic 2-3 L). Cardiovascular system: Irregular, positive systolic ejection murmur; no JVD, no rubs or gallops. Gastrointestinal system: Abdomen is obese, nondistended, soft and nontender. No organomegaly or masses felt. Normal bowel sounds heard. Central nervous system: Unable to properly assess with current mentation Extremities: Cyanosis or clubbing. Skin: No petechiae. Psychiatry: Unable to properly assess with current mentation.  Data Reviewed: I have personally reviewed following labs and imaging studies  CBC: Recent Labs  Lab 12/13/20 0719 12/14/20 0606  WBC 12.5* 9.6  NEUTROABS 8.9*  --   HGB 12.3* 11.8*  HCT 41.0 39.7  MCV 82.7 82.2  PLT 333 [id number]    Basic Metabolic Panel: Recent Labs  Lab 12/13/20 0719 12/14/20 0606  NA 136 137  K 4.5 4.7  CL 97* 100  CO2 31 28  GLUCOSE 270* 215*  BUN 8 10  CREATININE 0.81 0.90  CALCIUM 9.2 9.2    GFR: Estimated Creatinine Clearance: 91.3 mL/min (by C-G formula based on SCr of 0.9 mg/dL).  Liver  Function Tests: Recent Labs  Lab 12/13/20 0719  AST 13*  ALT 13  ALKPHOS 81  BILITOT 0.3  PROT 6.6  ALBUMIN 3.0*    CBG: Recent Labs  Lab 12/13/20 1335 12/13/20 1808 12/13/20 2230 12/14/20 0715 12/14/20 1118  GLUCAP 216* 199* 200* 206* 163*     Recent Results (from the past 240 hour(s))  Urine culture     Status: Abnormal   Collection Time: 12/13/20 10:04 AM   Specimen: Urine, Clean Catch  Result Value Ref Range Status   Specimen Description   Final    URINE, CLEAN CATCH Performed at [facility], [address] [place], [place] [address]    Special Requests   Final    NONE Performed at [facility] [address] [place], [place] [address]    Culture MULTIPLE SPECIES PRESENT, SUGGEST RECOLLECTION (A)  Final   Report Status 12/14/2020 FINAL  Final  Culture, blood (Routine X 2) w Reflex to ID Panel     Status: None (Preliminary result)   Collection Time: 12/13/20  2:07 PM   Specimen: BLOOD  Result Value Ref Range Status   Specimen Description BLOOD BLOOD RIGHT HAND  Final   Special Requests   Final    Blood Culture results may not be optimal due to an inadequate volume of blood received in culture bottles BOTTLES DRAWN AEROBIC AND ANAEROBIC   Culture   Final    NO GROWTH <  24 HOURS Performed at [facility], [address], [place], [place] [address]    Report Status PENDING  Incomplete  Culture, blood (Routine X 2) w Reflex to ID Panel     Status: None (Preliminary result)   Collection Time: 12/14/20  6:06 AM   Specimen: BLOOD  Result Value Ref Range Status   Specimen Description BLOOD BLOOD RIGHT HAND  Final   Special Requests   Final    Blood Culture adequate volume BOTTLES DRAWN AEROBIC AND ANAEROBIC   Culture   Final    NO GROWTH <12 HOURS Performed at [facility], [address], [place],  [address]    Report Status PENDING  Incomplete     Radiology Studies: CT Head Wo Contrast  Result Date: 12/13/2020 CLINICAL DATA:  Altered mental  status. EXAM: CT HEAD WITHOUT CONTRAST TECHNIQUE: Contiguous axial images were obtained from the base of the skull through the vertex without intravenous contrast. COMPARISON:  November 10, 2020. FINDINGS: Brain: Mild diffuse cortical atrophy is noted. Mild chronic ischemic white matter disease is noted. No mass effect or midline shift is noted. Ventricular size is within normal limits. There is no evidence of mass lesion, hemorrhage or acute infarction. Vascular: No hyperdense vessel or unexpected calcification. Skull: Normal. Negative for fracture or focal lesion. Sinuses/Orbits: No acute finding. Other: None. IMPRESSION: Mild diffuse cortical atrophy. Mild chronic ischemic white matter disease. No acute intracranial abnormality seen. Electronically Signed   By: Marijo Conception M.D.   On: 12/13/2020 12:20    Scheduled Meds: . apixaban  5 mg Oral BID  . cholecalciferol  1,000 Units Oral BID  . DULoxetine  30 mg Oral q morning  . fluticasone furoate-vilanterol  1 puff Inhalation Daily   And  . umeclidinium bromide  1 puff Inhalation Daily  . gabapentin  300 mg Oral QHS  . insulin aspart  0-5 Units Subcutaneous QHS  . insulin aspart  0-9 Units Subcutaneous TID WC  . insulin glargine  15 Units Subcutaneous QHS  . metoprolol tartrate  25 mg Oral BID  . pantoprazole  40 mg Oral Daily  . polyethylene glycol  17 g Oral Daily  . QUEtiapine  50 mg Oral QHS  . rosuvastatin  10 mg Oral Daily  . senna-docusate  2 tablet Oral BID  . sodium chloride flush  3 mL Intravenous Q12H  . sodium chloride flush  3 mL Intravenous Q12H   Continuous Infusions: . sodium chloride    . sodium chloride 50 mL/hr at 12/13/20 2058  . cefTRIAXone (ROCEPHIN)  IV 1 g (12/14/20 1239)  . fluconazole (DIFLUCAN) IV 200 mg (12/13/20 2228)     LOS: 0 days    Time spent: 68 minutes   Barton Dubois, MD [facility]   To contact the attending provider between 7A-7P or the covering provider during after hours  7P-7A, please log into the web site [email or web address] and access using universal [place] password for that web site. If you do not have the password, please call the hospital operator.  12/14/2020, 3:09 PM

## 2020-12-15 DIAGNOSIS — I639 Cerebral infarction, unspecified: Secondary | ICD-10-CM | POA: Diagnosis not present

## 2020-12-15 DIAGNOSIS — G9341 Metabolic encephalopathy: Secondary | ICD-10-CM | POA: Diagnosis not present

## 2020-12-15 DIAGNOSIS — I5032 Chronic diastolic (congestive) heart failure: Secondary | ICD-10-CM | POA: Diagnosis not present

## 2020-12-15 DIAGNOSIS — I10 Essential (primary) hypertension: Secondary | ICD-10-CM | POA: Diagnosis not present

## 2020-12-15 LAB — GLUCOSE, CAPILLARY
Glucose-Capillary: 156 mg/dL — ABNORMAL HIGH (ref 70–99)
Glucose-Capillary: 143 mg/dL — ABNORMAL HIGH (ref 70–99)
Glucose-Capillary: 137 mg/dL — ABNORMAL HIGH (ref 70–99)
Glucose-Capillary: 166 mg/dL — ABNORMAL HIGH (ref 70–99)

## 2020-12-15 NOTE — Progress Notes (Addendum)
PROGRESS NOTE    Rick Cruz  [id number] DOB: 1945-11-18 DOA: 12/13/2020 PCP: Vassie Moment, NP (Inactive)    Chief Complaint  Patient presents with  . Altered Mental Status    Brief admission Narrative:  As per H&P written by Dr. Denton Brick on 12/13/2020  Rick Cruz  is a 75 y.o. 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, sleep apnea noncompliant with BiPAP, history of tobacco abuse, type II DM, hyperlipidemia, hypertension, history of unspecified schizophrenia who presents from [facility] due to concerns about worsening confusion and combativeness and bizarre behavior, as per staff from SNF patient apparently was eating his own stool -Patient tested positive for Covid more than 4 weeks ago -CT head  without acute findings -CMP remarkable for glucose of 270 creatinine is normal at 0.8 -CBC with a white count of 12.5  -Hemoglobin is 12.3 which is close to patient's baseline -UA suggestive of UTI,, UA also has yeast -EDP requested hospitalization for presumed UTI with altered mentation -Patient is a very poor historian but apparently no vomiting or diarrhea, and no fevers  Assessment & Plan: Acute metabolic encephalopathy -In the setting of presumed UTI -Follow culture results -Continue current antibiotic -Patient is afebrile -Continue experiencing altered mentation, combativeness and agitation. -Mittens and benzodiazepine as needed has been used. -Minimize the use of medications that can alter mentation.  UTI -Cultures pending -Continue empirical Rocephin and Diflucan  Essential hypertension -Stable overall -Continue current antihypertensive agents.  Paranoid schizophrenia ([place]) -Planning to resume home psychiatric medications when able to tolerate by mouth -Currently requiring the use of mittens and  intermittent use of lorazepam due to ongoing agitation/combative behavior.  CAD (coronary artery disease) -No chest pain,  Chronic diastolic CHF (congestive heart failure) (HCC) -Continue holding diuretics currently -Follow daily weights and strict I's and O -Gentle hydration provided -Will discuss low-sodium diet when fully alert and able to follow commands/instructions.  Uncontrolled type 2 diabetes mellitus with complication (HCC) -Follow CBGs and adjust hypoglycemic regimen as needed -Continue sliding scale insulin and Lantus. -Dose of hypoglycemic agent has been adjusted in the setting of poor oral intake.  Paroxysmal atrial fibrillation (HCC) -Continue Eliquis for secondary prevention -Rate control at this time.  Class II obesity, chronic hypoxic and hypercapnic respiratory failure and obstructive sleep apnea -Continue home oxygen supplementation -Patient noncompliant with CPAP -Continue as needed bronchodilators. -Body mass index is 32.14 kg/m. -Will check ABG  Ischemic cerebrovascular accident (CVA) ([place]) -Continue Eliquis for secondary prevention -CT scan of the head on admission not demonstrating acute intracranial abnormalities.   DVT prophylaxis: Eliquis Code Status: Full Code. Family Communication: No family at bedside; extensive discussion and update with wife over the phone 12/15/2020. Disposition:   Status is: Inpatient  Dispo: The patient is from: SNF              Anticipated d/c is to: Skilled nursing facility              Patient currently is not medically stable for discharge; continue to experience altered mentation, agitation and combative behavior.  Continue current antibiotics, IV fluids and supportive care.  Follow culture results and clinical response.   Difficult to place patient no    Consultants:   None   Procedures:  See below for x-ray reports.   Antimicrobials:  Rocephin Diflucan    Subjective: Continue to demonstrate  difficulty taking p.o.'s;  no chest pain, no nausea, no vomiting.  Currently afebrile.  Intermittently demonstrating combativeness and agitation.  The use of mittens and benzodiazepine has been required.  Objective: Vitals:   12/14/20 2036 12/14/20 2038 12/15/20 0430 12/15/20 1355  BP: (!) 148/95  126/70 (!) 146/89  Pulse: (!) 111 (!) 109 93 (!) 58  Resp: '20  19 18  '$ Temp: 98.2 F (36.8 C)  98.2 F (36.8 C) 98.3 F (36.8 C)  TempSrc: Oral   Oral  SpO2: (!) 89% 90% 100% 91%  Weight:      Height:        Intake/Output Summary (Last 24 hours) at 12/15/2020 1815 Last data filed at 12/15/2020 1727 Gross per 24 hour  Intake 524.21 ml  Output --  Net 524.21 ml   Filed Weights   12/13/20 0701  Weight: 107.5 kg    Examination: General exam: Patient continues to be lethargic and having difficulty following commands.  Per nursing reports agitation and combativeness throughout the night requiring the need for mittens and benzodiazepines.  No fever, no chest pain, no nausea, no vomiting.  Decreased oral intake has been reported. Respiratory system: Clear to auscultation. Respiratory effort normal.  Intermittently on 2 L nasal cannula supplementation. Cardiovascular system: Positive systolic ejection murmur, irregular rate, no JVD, no rubs or gallops. Gastrointestinal system: Abdomen is nondistended, soft and nontender. No organomegaly or masses felt. Normal bowel sounds heard. Central nervous system: Moving 4 limbs subcutaneously; no focal neurological deficits. Extremities: No cyanosis or clubbing. Skin: No petechiae. Psychiatry: Judgement and insight appear impaired in the setting of altered mental status and underlying dementia.   Data Reviewed: I have personally reviewed following labs and imaging studies  CBC: Recent Labs  Lab 12/13/20 0719 12/14/20 0606  WBC 12.5* 9.6  NEUTROABS 8.9*  --   HGB 12.3* 11.8*  HCT 41.0 39.7  MCV 82.7 82.2  PLT 333 [id number]    Basic Metabolic  Panel: Recent Labs  Lab 12/13/20 0719 12/14/20 0606  NA 136 137  K 4.5 4.7  CL 97* 100  CO2 31 28  GLUCOSE 270* 215*  BUN 8 10  CREATININE 0.81 0.90  CALCIUM 9.2 9.2    GFR: Estimated Creatinine Clearance: 91.3 mL/min (by C-G formula based on SCr of 0.9 mg/dL).  Liver Function Tests: Recent Labs  Lab 12/13/20 0719  AST 13*  ALT 13  ALKPHOS 81  BILITOT 0.3  PROT 6.6  ALBUMIN 3.0*    CBG: Recent Labs  Lab 12/14/20 1608 12/14/20 2100 12/15/20 0722 12/15/20 1106 12/15/20 1630  GLUCAP 145* 155* 156* 143* 137*     Recent Results (from the past 240 hour(s))  Urine culture     Status: Abnormal   Collection Time: 12/13/20 10:04 AM   Specimen: Urine, Clean Catch  Result Value Ref Range Status   Specimen Description   Final    URINE, CLEAN CATCH Performed at [facility], [address], [place], [place] [address]    Special Requests   Final    NONE Performed at [facility], [address], [place],  [address]    Culture MULTIPLE SPECIES PRESENT, SUGGEST RECOLLECTION (A)  Final   Report Status 12/14/2020 FINAL  Final  Culture, blood (Routine X 2) w Reflex to ID Panel     Status: None (Preliminary result)   Collection Time: 12/13/20  2:07 PM   Specimen: BLOOD  Result Value Ref Range Status   Specimen Description BLOOD BLOOD RIGHT HAND  Final  Special Requests   Final    Blood Culture results may not be optimal due to an inadequate volume of blood received in culture bottles BOTTLES DRAWN AEROBIC AND ANAEROBIC   Culture   Final    NO GROWTH 2 DAYS Performed at [facility], [address], [place], [place] [address]    Report Status PENDING  Incomplete  Culture, blood (Routine X 2) w Reflex to ID Panel     Status: None (Preliminary result)   Collection Time: 12/14/20  6:06 AM   Specimen: BLOOD  Result Value Ref Range Status   Specimen Description BLOOD BLOOD RIGHT HAND  Final   Special Requests   Final    Blood Culture adequate volume BOTTLES DRAWN  AEROBIC AND ANAEROBIC   Culture   Final    NO GROWTH < 24 HOURS Performed at [facility], [address], [place], [place] [address]    Report Status PENDING  Incomplete     Radiology Studies: No results found.  Scheduled Meds: . apixaban  5 mg Oral BID  . cholecalciferol  1,000 Units Oral BID  . DULoxetine  30 mg Oral q morning  . fluticasone furoate-vilanterol  1 puff Inhalation Daily   And  . umeclidinium bromide  1 puff Inhalation Daily  . gabapentin  300 mg Oral QHS  . insulin aspart  0-5 Units Subcutaneous QHS  . insulin aspart  0-9 Units Subcutaneous TID WC  . insulin glargine  15 Units Subcutaneous QHS  . metoprolol tartrate  25 mg Oral BID  . pantoprazole  40 mg Oral Daily  . polyethylene glycol  17 g Oral Daily  . QUEtiapine  50 mg Oral QHS  . rosuvastatin  10 mg Oral Daily  . senna-docusate  2 tablet Oral BID  . sodium chloride flush  3 mL Intravenous Q12H  . sodium chloride flush  3 mL Intravenous Q12H   Continuous Infusions: . sodium chloride    . cefTRIAXone (ROCEPHIN)  IV 1 g (12/15/20 1609)  . fluconazole (DIFLUCAN) IV 200 mg (12/14/20 1908)     LOS: 1 day    Time spent: 35 minutes   Barton Dubois, MD [facility]   To contact the attending provider between 7A-7P or the covering provider during after hours 7P-7A, please log into the web site [email or web address] and access using universal [place] password for that web site. If you do not have the password, please call the hospital operator.  12/15/2020, 6:15 PM

## 2020-12-15 NOTE — TOC Initial Note (Signed)
Transition of Care [facility]) - Initial/Assessment Note    Patient Details  Name: Rick Cruz MRN: [id number] Date of Birth: 21-Jun-1946  Transition of Care [facility]) CM/SW Contact:    Boneta Lucks, RN Phone Number: 12/15/2020, 11:22 AM  Clinical Narrative:      Patient admitted with Acute metabolic encephalopathy. From [facility].  Patient had [employer], Jackelyn Poling is asking to start [place].  TOC ask MD for PT eval.  He can come back under LTC if necessary.  Wife lives three hours away.  Helene Kelp at [facility] has sent out [facility] to his home area for bed offers. They will continue to work on transferring once back in the facility. No bed offers at present time. TOC to follow.              Expected Discharge Plan: Skilled Nursing Facility Barriers to Discharge: Continued Medical Work up   Patient Goals and CMS Choice Patient states their goals for this hospitalization and ongoing recovery are:: to go back to SNF. CMS Medicare.gov Compare Post Acute Care list provided to:: Patient Represenative (must comment) Choice offered to / list presented to : Spouse  Expected Discharge Plan and Services Expected Discharge Plan: [place]      Living arrangements for the past 2 months: [place]       Prior Living Arrangements/Services Living arrangements for the past 2 months: [place] Lives with:: Facility Resident          Need for Family Participation in Patient Care: Yes (Comment) Care giver support system in place?: Yes (comment)   Criminal Activity/Legal Involvement Pertinent to Current Situation/Hospitalization: No - Comment as needed  Activities of Daily Living      Permission Sought/Granted      Emotional Assessment      Alcohol / Substance Use: Not Applicable Psych Involvement: No (comment)  Admission diagnosis:  Encephalopathy [G93.40] Urinary tract infection with hematuria, site unspecified [N39.0, [id number] Acute metabolic encephalopathy  99991111 UTI (urinary tract infection) [N39.0] Patient Active Problem List   Diagnosis Date Noted  . UTI (urinary tract infection) 12/14/2020  . CVA (cerebral vascular accident) ([place]) 10/12/2020  . Ischemic cerebrovascular accident (CVA) ([place]) 10/11/2020  . Hypoalbuminemia 10/11/2020  . Class 1 obesity 10/11/2020  . Iron deficiency anemia 10/11/2020  . Acute metabolic encephalopathy [id number]  . DKA, type 2, not at goal [facility]) 11/03/2019  . Heart failure ([place]) 02/02/2019  . Hyperglycemia   . Altered mental status 11/21/2018  . Diabetic foot ([place]) 07/23/2018  . Accidental fall 06/25/2018  . Supplemental oxygen dependent 06/25/2018  . 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 Neck pain 01/09/2018  . 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 Diabetic neuropathy ([place]) 12/13/2017  . Gastroesophageal reflux disease 12/13/2017  . Insomnia 07/30/2017  . Depression 07/19/2016  . OSA (obstructive sleep apnea)   . Chronic respiratory failure with hypercapnia ([place])   . Undifferentiated schizophrenia ([place])   . Uncontrolled type 2 diabetes mellitus with complication ([place])   . Anemia of chronic disease   . Paroxysmal atrial fibrillation (HCC)   . Hyponatremia 06/12/2016  . Aspiration pneumonia ([place]) 08/16/2015  . Seborrheic keratosis 08/04/2015  . Pure hypercholesterolemia 07/05/2015  . Obesity hypoventilation syndrome ([place]) 04/05/2015  . CSA (central sleep apnea) 04/05/2015  . Non compliance with medical treatment 04/05/2015  . Type 2 diabetes mellitus with hyperglycemia ([place]) 02/28/2015  . Acute respiratory failure with hypoxia and hypercarbia (HCC)   . Chronic diastolic CHF (congestive heart failure) ([place])  08/06/2014  . Recurrent falls 05/08/2014  . Ankle fracture, right 05/08/2014  . Ankle fracture, left 05/08/2014  . C. difficile colitis 01/04/2014  . Delirium 01/02/2014  . Acute encephalopathy 01/02/2014  . Morbid obesity ([place]) 06/17/2013  . Erosive esophagitis 04/22/2013  . Chronic  respiratory failure 02 dep with hypercarbia 04/22/2013  . Type 2 diabetes mellitus without complication, with long-term current use of insulin ([place]) 04/21/2013  . Paranoid schizophrenia ([place]) 04/18/2013  . CAD (coronary artery disease) 04/18/2013  . DYSLIPIDEMIA 08/25/2008  . Obstructive sleep apnea 08/25/2008  . Essential hypertension 08/25/2008  . Dyslipidemia 08/25/2008   PCP:  Vassie Moment, NP (Inactive) Pharmacy:   [facility], [place] AT [place] [id number] [place] [address] Phone: [phone] Fax: [phone]   Readmission Risk Interventions Readmission Risk Prevention Plan 12/15/2020  Transportation Screening Complete  PCP or Specialist Appt within 3-5 Days Complete  HRI or Home Care Consult Complete  Social Work Consult for [place] Planning/Counseling Complete  Palliative Care Screening Not Applicable  Medication Review Press photographer) Complete  Some recent data might be hidden

## 2020-12-16 DIAGNOSIS — I5032 Chronic diastolic (congestive) heart failure: Secondary | ICD-10-CM | POA: Diagnosis not present

## 2020-12-16 DIAGNOSIS — I639 Cerebral infarction, unspecified: Secondary | ICD-10-CM | POA: Diagnosis not present

## 2020-12-16 DIAGNOSIS — G9341 Metabolic encephalopathy: Secondary | ICD-10-CM | POA: Diagnosis not present

## 2020-12-16 DIAGNOSIS — I10 Essential (primary) hypertension: Secondary | ICD-10-CM | POA: Diagnosis not present

## 2020-12-16 LAB — BASIC METABOLIC PANEL
Anion gap: 9 (ref 5–15)
BUN: 12 mg/dL (ref 8–23)
CO2: 27 mmol/L (ref 22–32)
Calcium: 9.2 mg/dL (ref 8.9–10.3)
Chloride: 105 mmol/L (ref 98–111)
Creatinine, Ser: 0.97 mg/dL (ref 0.61–1.24)
GFR, Estimated: >60 mL/min (ref >60)
Glucose, Bld: 141 mg/dL — ABNORMAL HIGH (ref 70–99)
Potassium: 4.1 mmol/L (ref 3.5–5.1)
Sodium: 141 mmol/L (ref 135–145)

## 2020-12-16 LAB — CBC
HCT: 38.1 % — ABNORMAL LOW (ref 39.0–52.0)
Hemoglobin: 11.1 g/dL — ABNORMAL LOW (ref 13.0–17.0)
MCH: 24.6 pg — ABNORMAL LOW (ref 26.0–34.0)
MCHC: 29.1 g/dL — ABNORMAL LOW (ref 30.0–36.0)
MCV: 84.3 fL (ref 80.0–100.0)
Platelets: 269 10*3/uL (ref 150–400)
RBC: 4.52 MIL/uL (ref 4.22–5.81)
RDW: 16.4 % — ABNORMAL HIGH (ref 11.5–15.5)
WBC: 8.3 10*3/uL (ref 4.0–10.5)
nRBC: 0 % (ref 0.0–0.2)

## 2020-12-16 LAB — GLUCOSE, CAPILLARY
Glucose-Capillary: 138 mg/dL — ABNORMAL HIGH (ref 70–99)
Glucose-Capillary: 180 mg/dL — ABNORMAL HIGH (ref 70–99)
Glucose-Capillary: 190 mg/dL — ABNORMAL HIGH (ref 70–99)
Glucose-Capillary: 188 mg/dL — ABNORMAL HIGH (ref 70–99)

## 2020-12-16 MED ORDER — LORAZEPAM 0.5 MG PO TABS: 0.5000 mg | ORAL_TABLET | Freq: Two times a day (BID) | ORAL | Status: DC | PRN

## 2020-12-16 MED ORDER — QUETIAPINE FUMARATE 25 MG PO TABS
25.0000 mg | ORAL_TABLET | Freq: Two times a day (BID) | ORAL | Status: DC
  Administered 2020-12-16 – 2020-12-19 (×6): 25 mg via ORAL
  Filled 2020-12-16 (×6): qty 1

## 2020-12-16 NOTE — Progress Notes (Signed)
PROGRESS NOTE    Rick Cruz  [id number] DOB: 1946-08-25 DOA: 12/13/2020 PCP: Vassie Moment, NP (Inactive)    Chief Complaint  Patient presents with  . Altered Mental Status    Brief admission Narrative:  As per H&P written by Dr. Denton Brick on 12/13/2020  Rick Cruz  is a 75 y.o. 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, sleep apnea noncompliant with BiPAP, history of tobacco abuse, type II DM, hyperlipidemia, hypertension, history of unspecified schizophrenia who presents from [facility] due to concerns about worsening confusion and combativeness and bizarre behavior, as per staff from SNF patient apparently was eating his own stool -Patient tested positive for Covid more than 4 weeks ago -CT head  without acute findings -CMP remarkable for glucose of 270 creatinine is normal at 0.8 -CBC with a white count of 12.5  -Hemoglobin is 12.3 which is close to patient's baseline -UA suggestive of UTI,, UA also has yeast -EDP requested hospitalization for presumed UTI with altered mentation -Patient is a very poor historian but apparently no vomiting or diarrhea, and no fevers  Assessment & Plan: Acute metabolic encephalopathy -In the setting of presumed UTI -Follow culture results -Continue current antibiotic -Patient is afebrile -Mentation has improved; oriented x2, following commands and no presenting agitation.. -Continue to minimize the use of medications that can alter mentation. -Evaluation by physical therapy requested -Hopefully discharge back to skilled nursing facility on 2022.  UTI -Cultures demonstrating multiple microorganisms without specific isolated specie; positive for yeast. -Continue empirical Rocephin with intention to complete a total of 5 days treatment -Patient has completed IV Diflucan  therapy.  Essential hypertension -Stable overall -Continue current antihypertensive agents.  Paranoid schizophrenia ([place]) -Planning to resume home psychiatric medications when able to tolerate by mouth -Currently requiring the use of mittens and intermittent use of lorazepam due to ongoing agitation/combative behavior.  CAD (coronary artery disease) -No chest pain,  Chronic diastolic CHF (congestive heart failure) (HCC) -Continue holding diuretics currently -Follow daily weights and strict I's and O -Gentle hydration provided -Will discuss low-sodium diet when fully alert and able to follow commands/instructions.  Uncontrolled type 2 diabetes mellitus with complication (HCC) -Follow CBGs and adjust hypoglycemic regimen as needed -Continue sliding scale insulin and Lantus. -Dose of hypoglycemic agent has been adjusted in the setting of poor oral intake.  Paroxysmal atrial fibrillation (HCC) -Continue Eliquis for secondary prevention -Rate control at this time.  Class II obesity, chronic hypoxic and hypercapnic respiratory failure and obstructive sleep apnea -Continue home oxygen supplementation -Patient noncompliant with CPAP -Continue as needed bronchodilators. -Body mass index is 32.14 kg/m.  Ischemic cerebrovascular accident (CVA) ([place]) -Continue Eliquis for secondary prevention -CT scan of the head on admission not demonstrating acute intracranial abnormalities.   DVT prophylaxis: Eliquis Code Status: Full Code. Family Communication: No family at bedside; extensive discussion and update with wife over the phone 12/15/2020. Disposition:   Status is: Inpatient  Dispo: The patient is from: SNF              Anticipated d/c is to: Skilled nursing facility              Patient currently is reaching medical stability; oriented x2 and following commands appropriately currently.  No agitation or combative behavior.  Has completed treatment with Diflucan; given complaints of  ongoing frequency will continue Rocephin with intention to provide a total of  5 days treatment.  Advance diet and assess evaluation by physical therapy.   Difficult to place patient no    Consultants:   None   Procedures:  See below for x-ray reports.   Antimicrobials:  Rocephin 12/13/20 Diflucan 12/13/20>>>12/15/20   Subjective: Oriented x2; no fever, no nausea, no vomiting.  Patient following commands appropriately today and not demonstrating agitation or combativeness.  Expressed frequency but denies dysuria.  Objective: Vitals:   12/16/20 0518 12/16/20 0641 12/16/20 0801 12/16/20 1412  BP: (!) 93/57 (!) 140/91  (!) 154/72  Pulse: 83 91  (!) 47  Resp: 19   18  Temp: 97.6 F (36.4 C)   98.3 F (36.8 C)  TempSrc:    Oral  SpO2: 99%  90% 92%  Weight:      Height:        Intake/Output Summary (Last 24 hours) at 12/16/2020 1537 Last data filed at 12/16/2020 0800 Gross per 24 hour  Intake 453.67 ml  Output 500 ml  Net -46.33 ml   Filed Weights   12/13/20 0701  Weight: 107.5 kg    Examination: General exam: Alert, awake, oriented x 2; following commands appropriately, afebrile and in no acute distress.  Denies nausea or vomiting.  Report increased frequency but denies dysuria.  No agitation or combativeness appreciated. Respiratory system: Clear to auscultation. Respiratory effort normal.  Currently using 2 L nasal cannula supplementation. Cardiovascular system: Irregular.  No JVD, no rubs or gallops.  Positive systolic ejection murmur. Gastrointestinal system: Abdomen is nondistended, soft and nontender. No organomegaly or masses felt. Normal bowel sounds heard. Central nervous system: No focal neurological deficits. Extremities: No cyanosis no clubbing. Skin: No petechiae. Psychiatry: Mood & affect appropriate.    Data Reviewed: I have personally reviewed following labs and imaging studies  CBC: Recent Labs  Lab 12/13/20 0719 12/14/20 0606 12/16/20 0430  WBC  12.5* 9.6 8.3  NEUTROABS 8.9*  --   --   HGB 12.3* 11.8* 11.1*  HCT 41.0 39.7 38.1*  MCV 82.7 82.2 84.3  PLT 333 295 [id number]    Basic Metabolic Panel: Recent Labs  Lab 12/13/20 0719 12/14/20 0606 12/16/20 0430  NA 136 137 141  K 4.5 4.7 4.1  CL 97* 100 105  CO2 '31 28 27  '$ GLUCOSE 270* 215* 141*  BUN '8 10 12  '$ CREATININE 0.81 0.90 0.97  CALCIUM 9.2 9.2 9.2    GFR: Estimated Creatinine Clearance: 84.7 mL/min (by C-G formula based on SCr of 0.97 mg/dL).  Liver Function Tests: Recent Labs  Lab 12/13/20 0719  AST 13*  ALT 13  ALKPHOS 81  BILITOT 0.3  PROT 6.6  ALBUMIN 3.0*    CBG: Recent Labs  Lab 12/15/20 1106 12/15/20 1630 12/15/20 2139 12/16/20 0747 12/16/20 1109  GLUCAP 143* 137* 166* 138* 180*     Recent Results (from the past 240 hour(s))  Urine culture     Status: Abnormal   Collection Time: 12/13/20 10:04 AM   Specimen: Urine, Clean Catch  Result Value Ref Range Status   Specimen Description   Final    URINE, CLEAN CATCH Performed at [facility], [address], [place], [place] [address]    Special Requests   Final    NONE Performed at [facility], [address], [place],  [address]    Culture MULTIPLE SPECIES PRESENT, SUGGEST RECOLLECTION (A)  Final   Report Status 12/14/2020 FINAL  Final  Culture, blood (Routine X 2) w Reflex to ID Panel  Status: None (Preliminary result)   Collection Time: 12/13/20  2:07 PM   Specimen: BLOOD  Result Value Ref Range Status   Specimen Description BLOOD BLOOD RIGHT HAND  Final   Special Requests   Final    Blood Culture results may not be optimal due to an inadequate volume of blood received in culture bottles BOTTLES DRAWN AEROBIC AND ANAEROBIC   Culture   Final    NO GROWTH 3 DAYS Performed at [facility], [address] [place], [place] [address]    Report Status PENDING  Incomplete  Culture, blood (Routine X 2) w Reflex to ID Panel     Status: None (Preliminary result)   Collection Time:  12/14/20  6:06 AM   Specimen: BLOOD  Result Value Ref Range Status   Specimen Description BLOOD BLOOD RIGHT HAND  Final   Special Requests   Final    Blood Culture adequate volume BOTTLES DRAWN AEROBIC AND ANAEROBIC   Culture   Final    NO GROWTH 2 DAYS Performed at [facility], [address] [place], [name] [address]    Report Status PENDING  Incomplete     Radiology Studies: No results found.  Scheduled Meds: . apixaban  5 mg Oral BID  . cholecalciferol  1,000 Units Oral BID  . DULoxetine  30 mg Oral q morning  . fluticasone furoate-vilanterol  1 puff Inhalation Daily   And  . umeclidinium bromide  1 puff Inhalation Daily  . gabapentin  300 mg Oral QHS  . insulin aspart  0-5 Units Subcutaneous QHS  . insulin aspart  0-9 Units Subcutaneous TID WC  . insulin glargine  15 Units Subcutaneous QHS  . metoprolol tartrate  25 mg Oral BID  . pantoprazole  40 mg Oral Daily  . polyethylene glycol  17 g Oral Daily  . QUEtiapine  25 mg Oral BID  . rosuvastatin  10 mg Oral Daily  . senna-docusate  2 tablet Oral BID  . sodium chloride flush  3 mL Intravenous Q12H  . sodium chloride flush  3 mL Intravenous Q12H   Continuous Infusions: . sodium chloride Stopped (12/15/20 2118)  . cefTRIAXone (ROCEPHIN)  IV 1 g (12/16/20 1332)     LOS: 2 days    Time spent: 30 minutes   Barton Dubois, MD [facility]   To contact the attending provider between 7A-7P or the covering provider during after hours 7P-7A, please log into the web site [email or web address] and access using universal [place] password for that web site. If you do not have the password, please call the hospital operator.  12/16/2020, 3:37 PM

## 2020-12-16 NOTE — Plan of Care (Signed)

## 2020-12-17 DIAGNOSIS — G9341 Metabolic encephalopathy: Secondary | ICD-10-CM | POA: Diagnosis not present

## 2020-12-17 DIAGNOSIS — I10 Essential (primary) hypertension: Secondary | ICD-10-CM | POA: Diagnosis not present

## 2020-12-17 DIAGNOSIS — I5032 Chronic diastolic (congestive) heart failure: Secondary | ICD-10-CM | POA: Diagnosis not present

## 2020-12-17 DIAGNOSIS — I639 Cerebral infarction, unspecified: Secondary | ICD-10-CM | POA: Diagnosis not present

## 2020-12-17 LAB — GLUCOSE, CAPILLARY
Glucose-Capillary: 138 mg/dL — ABNORMAL HIGH (ref 70–99)
Glucose-Capillary: 161 mg/dL — ABNORMAL HIGH (ref 70–99)
Glucose-Capillary: 139 mg/dL — ABNORMAL HIGH (ref 70–99)
Glucose-Capillary: 146 mg/dL — ABNORMAL HIGH (ref 70–99)

## 2020-12-17 NOTE — Progress Notes (Signed)
PROGRESS NOTE    Rick Cruz  [id number] DOB: 1946-07-18 DOA: 12/13/2020 PCP: Vassie Moment, NP (Inactive)    Chief Complaint  Patient presents with  . Altered Mental Status    Brief admission Narrative:  As per H&P written by Dr. Denton Brick on 12/13/2020  Rick Cruz  is a 75 y.o. 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, sleep apnea noncompliant with BiPAP, history of tobacco abuse, type II DM, hyperlipidemia, hypertension, history of unspecified schizophrenia who presents from [facility] due to concerns about worsening confusion and combativeness and bizarre behavior, as per staff from SNF patient apparently was eating his own stool -Patient tested positive for Covid more than 4 weeks ago -CT head  without acute findings -CMP remarkable for glucose of 270 creatinine is normal at 0.8 -CBC with a white count of 12.5  -Hemoglobin is 12.3 which is close to patient's baseline -UA suggestive of UTI,, UA also has yeast -EDP requested hospitalization for presumed UTI with altered mentation -Patient is a very poor historian but apparently no vomiting or diarrhea, and no fevers  Assessment & Plan: Acute metabolic encephalopathy -In the setting of presumed UTI -Follow culture results -Continue current antibiotic -Patient is afebrile -Mentation has improved; oriented x2, following commands and no presenting agitation.. -Continue to minimize the use of medications that can alter mentation. -Evaluation by physical therapy requested -Hopefully discharge back to skilled nursing facility on 2022.  UTI -Cultures demonstrating multiple microorganisms without specific isolated specie; positive for yeast. -Continue empirical Rocephin with intention to complete a total of 5 days treatment -Patient has completed IV Diflucan  therapy.  Essential hypertension -Stable overall -Continue current antihypertensive agents.  Paranoid schizophrenia ([place]) -Planning to resume home psychiatric medications when able to tolerate by mouth -Currently requiring the use of mittens and intermittent use of lorazepam due to ongoing agitation/combative behavior.  CAD (coronary artery disease) -No chest pain,  Chronic diastolic CHF (congestive heart failure) (HCC) -Continue holding diuretics currently -Follow daily weights and strict I's and O -Gentle hydration provided -Will discuss low-sodium diet when fully alert and able to follow commands/instructions.  Uncontrolled type 2 diabetes mellitus with complication (HCC) -Follow CBGs and adjust hypoglycemic regimen as needed -Continue sliding scale insulin and Lantus. -Dose of hypoglycemic agent has been adjusted in the setting of poor oral intake.  Paroxysmal atrial fibrillation (HCC) -Continue Eliquis for secondary prevention -Rate control at this time.  Class II obesity, chronic hypoxic and hypercapnic respiratory failure and obstructive sleep apnea -Continue home oxygen supplementation -Patient noncompliant with CPAP -Continue as needed bronchodilators. -Body mass index is 32.14 kg/m.  Ischemic cerebrovascular accident (CVA) ([place]) -Continue Eliquis for secondary prevention -CT scan of the head on admission not demonstrating acute intracranial abnormalities.   DVT prophylaxis: Eliquis Code Status: Full Code. Family Communication: No family at bedside; extensive discussion and update with wife over the phone 12/15/2020. Disposition:   Status is: Inpatient  Dispo: The patient is from: SNF              Anticipated d/c is to: Skilled nursing facility              Patient currently is medical stability; oriented x2 and following commands appropriately currently.  No agitation or combative behavior.  Has completed treatment with Diflucan; given complaints of ongoing  frequency will continue Rocephin with intention to provide a total of 5  days treatment.  Advance diet and follow final recommendations after  evaluation by physical therapy.   Difficult to place patient no    Consultants:   None   Procedures:  See below for x-ray reports.   Antimicrobials:  Rocephin 12/13/20 Diflucan 12/13/20>>>12/15/20   Subjective: Following commands appropriately, no acute distress.  Afebrile and denying dysuria.  Good oxygen saturation on 2 L.  Objective: Vitals:   12/16/20 2014 12/17/20 0546 12/17/20 0754 12/17/20 1411  BP: (!) 176/102 (!) 136/94  132/77  Pulse: 94 88  70  Resp: 19 19    Temp: 97.8 F (36.6 C) 98.1 F (36.7 C)  98 F (36.7 C)  TempSrc:      SpO2: 92% 92% 93%   Weight:      Height:        Intake/Output Summary (Last 24 hours) at 12/17/2020 1810 Last data filed at 12/17/2020 1629 Gross per 24 hour  Intake 1040 ml  Output 1100 ml  Net -60 ml   Filed Weights   12/13/20 0701  Weight: 107.5 kg    Examination: General exam: Alert, awake, oriented x 2; following commands appropriately.  No agitation or combativeness reported.  Tolerating oral diet and is feeling weak and deconditioned. Respiratory system: Good air movement bilaterally, no wheezing, no crackles, using 2 L of cannula supplementation intermittently. Cardiovascular system: Positive systolic murmur; no rate control.  No rubs or  gallops.  No JVD. Gastrointestinal system: Abdomen is nondistended, soft and nontender. No organomegaly or masses felt. Normal bowel sounds heard. Central nervous system: Alert and oriented. No focal neurological deficits. Extremities: No cyanosis or clubbing. Skin: No petechiae. Psychiatry: Mood & affect appropriate.   Data Reviewed: I have personally reviewed following labs and imaging studies  CBC: Recent Labs  Lab 12/13/20 0719 12/14/20 0606 12/16/20 0430  WBC 12.5* 9.6 8.3  NEUTROABS 8.9*  --   --   HGB 12.3* 11.8* 11.1*  HCT 41.0  39.7 38.1*  MCV 82.7 82.2 84.3  PLT 333 295 [id number]    Basic Metabolic Panel: Recent Labs  Lab 12/13/20 0719 12/14/20 0606 12/16/20 0430  NA 136 137 141  K 4.5 4.7 4.1  CL 97* 100 105  CO2 '31 28 27  '$ GLUCOSE 270* 215* 141*  BUN '8 10 12  '$ CREATININE 0.81 0.90 0.97  CALCIUM 9.2 9.2 9.2    GFR: Estimated Creatinine Clearance: 84.7 mL/min (by C-G formula based on SCr of 0.97 mg/dL).  Liver Function Tests: Recent Labs  Lab 12/13/20 0719  AST 13*  ALT 13  ALKPHOS 81  BILITOT 0.3  PROT 6.6  ALBUMIN 3.0*    CBG: Recent Labs  Lab 12/16/20 1555 12/16/20 2028 12/17/20 0732 12/17/20 1110 12/17/20 1610  GLUCAP 190* 188* 138* 161* 139*     Recent Results (from the past 240 hour(s))  Urine culture     Status: Abnormal   Collection Time: 12/13/20 10:04 AM   Specimen: Urine, Clean Catch  Result Value Ref Range Status   Specimen Description   Final    URINE, CLEAN CATCH Performed at [facility], [address] [place], [place] [address]    Special Requests   Final    NONE Performed at [facility], [address], [place], [place] [address]    Culture MULTIPLE SPECIES PRESENT, SUGGEST RECOLLECTION (A)  Final   Report Status 12/14/2020 FINAL  Final  Culture, blood (Routine X 2) w Reflex to ID Panel     Status: None (Preliminary result)  Collection Time: 12/13/20  2:07 PM   Specimen: BLOOD  Result Value Ref Range Status   Specimen Description BLOOD BLOOD RIGHT HAND  Final   Special Requests   Final    Blood Culture results may not be optimal due to an inadequate volume of blood received in culture bottles BOTTLES DRAWN AEROBIC AND ANAEROBIC   Culture   Final    NO GROWTH 4 DAYS Performed at [facility], [address], [place], [place] [address]    Report Status PENDING  Incomplete  Culture, blood (Routine X 2) w Reflex to ID Panel     Status: None (Preliminary result)   Collection Time: 12/14/20  6:06 AM   Specimen: BLOOD  Result Value Ref Range Status    Specimen Description BLOOD BLOOD RIGHT HAND  Final   Special Requests   Final    Blood Culture adequate volume BOTTLES DRAWN AEROBIC AND ANAEROBIC   Culture   Final    NO GROWTH 3 DAYS Performed at [facility], [address], [place], [place] [address]    Report Status PENDING  Incomplete     Radiology Studies: No results found.  Scheduled Meds: . apixaban  5 mg Oral BID  . cholecalciferol  1,000 Units Oral BID  . DULoxetine  30 mg Oral q morning  . fluticasone furoate-vilanterol  1 puff Inhalation Daily   And  . umeclidinium bromide  1 puff Inhalation Daily  . gabapentin  300 mg Oral QHS  . insulin aspart  0-5 Units Subcutaneous QHS  . insulin aspart  0-9 Units Subcutaneous TID WC  . insulin glargine  15 Units Subcutaneous QHS  . metoprolol tartrate  25 mg Oral BID  . pantoprazole  40 mg Oral Daily  . polyethylene glycol  17 g Oral Daily  . QUEtiapine  25 mg Oral BID  . rosuvastatin  10 mg Oral Daily  . senna-docusate  2 tablet Oral BID  . sodium chloride flush  3 mL Intravenous Q12H  . sodium chloride flush  3 mL Intravenous Q12H   Continuous Infusions: . sodium chloride Stopped (12/15/20 2118)  . cefTRIAXone (ROCEPHIN)  IV Stopped (12/17/20 1500)     LOS: 3 days    Time spent: 30 minutes   Barton Dubois, MD [facility]   To contact the attending provider between 7A-7P or the covering provider during after hours 7P-7A, please log into the web site [email or web address] and access using universal [place] password for that web site. If you do not have the password, please call the hospital operator.  12/17/2020, 6:10 PM

## 2020-12-17 NOTE — Plan of Care (Signed)

## 2020-12-18 DIAGNOSIS — I5032 Chronic diastolic (congestive) heart failure: Secondary | ICD-10-CM | POA: Diagnosis not present

## 2020-12-18 DIAGNOSIS — I10 Essential (primary) hypertension: Secondary | ICD-10-CM | POA: Diagnosis not present

## 2020-12-18 DIAGNOSIS — G9341 Metabolic encephalopathy: Secondary | ICD-10-CM | POA: Diagnosis not present

## 2020-12-18 DIAGNOSIS — I259 Chronic ischemic heart disease, unspecified: Secondary | ICD-10-CM | POA: Diagnosis not present

## 2020-12-18 LAB — CULTURE, BLOOD (ROUTINE X 2): Culture: NO GROWTH

## 2020-12-18 LAB — GLUCOSE, CAPILLARY
Glucose-Capillary: 128 mg/dL — ABNORMAL HIGH (ref 70–99)
Glucose-Capillary: 168 mg/dL — ABNORMAL HIGH (ref 70–99)
Glucose-Capillary: 127 mg/dL — ABNORMAL HIGH (ref 70–99)
Glucose-Capillary: 110 mg/dL — ABNORMAL HIGH (ref 70–99)

## 2020-12-18 MED ORDER — CEFTRIAXONE SODIUM 1 G IJ SOLR
1.0000 g | INTRAMUSCULAR | Status: DC
  Administered 2020-12-18: 1 g via INTRAMUSCULAR
  Filled 2020-12-18 (×4): qty 10

## 2020-12-18 MED ORDER — QUETIAPINE FUMARATE 25 MG PO TABS
25.0000 mg | ORAL_TABLET | Freq: Two times a day (BID) | ORAL | Status: AC
Start: 2020-12-18 — End: ?

## 2020-12-18 NOTE — Plan of Care (Signed)
  Problem: Acute Rehab PT Goals(only PT should resolve) Goal: Pt Will Go Supine/Side To Sit Outcome: Progressing Flowsheets (Taken 12/18/2020 1041) Pt will go Supine/Side to Sit: with minimal assist Goal: Pt Will Go Sit To Supine/Side Outcome: Progressing Flowsheets (Taken 12/18/2020 1041) Pt will go Sit to Supine/Side: with minimal assist Goal: Patient Will Transfer Sit To/From Stand Outcome: Progressing Flowsheets (Taken 12/18/2020 1041) Patient will transfer sit to/from stand: with moderate assist Goal: Pt Will Transfer Bed To Chair/Chair To Bed Outcome: Progressing Flowsheets (Taken 12/18/2020 1041) Pt will Transfer Bed to Chair/Chair to Bed: with mod assist  10:42 AM, 12/18/20 Mearl Latin PT, DPT Physical Therapist at [facility]

## 2020-12-18 NOTE — TOC Progression Note (Signed)
Transition of Care [facility]) - Progression Note    Patient Details  Name: WILEY BIBEY MRN: [id number] Date of Birth: 02/06/1946  [facility] CM/SW Contact  Natasha Bence, LCSW Phone Number: 12/18/2020, 5:25 PM  Clinical Narrative:    CSW contacted Debbie with [facility] to inquire if they would be able to take patient back on 04/03 under LTC. Debbie reported that it was his medicare right to be reviewed for SNF and that it was previously discussed with another [facility] staff member that a PT eval would be performed before sending patient back to SNF. CSW notified that PT did not see patient due to weekend restrictions. Debbie continued to not be agreeable to take patient on 4/2. TOC to follow.    Expected Discharge Plan: Skilled Nursing Facility Barriers to Discharge: Continued Medical Work up  Expected Discharge Plan and Services Expected Discharge Plan: [place] arrangements for the past 2 months: [place]                                       Social Determinants of Health (SDOH) Interventions    Readmission Risk Interventions Readmission Risk Prevention Plan 12/15/2020  Transportation Screening Complete  PCP or Specialist Appt within 3-5 Days Complete  HRI or [place] Complete  Social Work Consult for [place] Planning/Counseling Complete  Palliative Care Screening Not Applicable  Medication Review [employer]) Complete  Some recent data might be hidden

## 2020-12-18 NOTE — Evaluation (Signed)
Physical Therapy Evaluation Patient Details Name: Rick Cruz MRN: [id number] DOB: [birth year] Today's Date: 12/18/2020   History of Present Illness  Rick Cruz  is a 75 y.o. 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 Patient with slow, labored movements for bed mobility requiring verbal cueing for sequencing and assist throughout. Patient requires assist for LE movement and to pull to seated EOB. Patient demonstrates poor sitting tolerance and fatigues quickly. Patient assisted back into bed at end of session. Patient will benefit from continued physical therapy in hospital and recommended venue below to increase strength, balance, endurance for safe ADLs and gait.     Follow Up Recommendations SNF    Equipment Recommendations  None recommended by PT    Recommendations for Other Services       Precautions / Restrictions Precautions Precautions: Fall Restrictions Weight Bearing Restrictions: No      Mobility  Bed Mobility Overal bed mobility: Needs Assistance Bed Mobility: Supine to Sit;Sit to Supine     Supine to sit: Mod assist Sit to supine: Mod assist   General bed mobility comments: slow, labored transition to seated EOB, assist to pull to sitting and for LE movement    Transfers                    Ambulation/Gait                 Stairs            Wheelchair Mobility    Modified Rankin (Stroke Patients Only)       Balance Overall balance assessment: Needs assistance Sitting-balance support: Single extremity supported Sitting balance-Leahy Scale: Fair Sitting balance - Comments: seated EOB                                     Pertinent Vitals/Pain Pain Assessment: No/denies pain    Home Living Family/patient expects to be discharged to:: Skilled nursing facility                      Prior Function Level of Independence: Needs assistance   Gait / Transfers Assistance Needed: usually walked household distances with 2 wheeled walker  ADL's / Homemaking Assistance Needed: Patient states assist with all ADL        Hand Dominance        Extremity/Trunk Assessment   Upper Extremity Assessment Upper Extremity Assessment: Generalized weakness    Lower Extremity Assessment Lower Extremity Assessment: Generalized weakness    Cervical / Trunk Assessment Cervical / Trunk Assessment: Kyphotic  Communication   Communication: No difficulties  Cognition Arousal/Alertness: Awake/alert Behavior During Therapy: WFL for tasks assessed/performed Overall Cognitive Status: No family/caregiver present to determine baseline cognitive functioning  General Comments      Exercises     Assessment/Plan    PT Assessment Patient needs continued PT services  PT Problem List Decreased strength;Decreased activity tolerance;Decreased balance;Decreased mobility       PT Treatment Interventions DME instruction;Balance training;Gait training;Neuromuscular re-education;Stair training;Functional mobility training;Patient/family education;Therapeutic activities;Therapeutic exercise    PT Goals (Current goals can be found in the Care Plan section)  Acute Rehab PT Goals Patient Stated Goal: feel better PT Goal Formulation: With  patient Time For Goal Achievement: 01/01/21 Potential to Achieve Goals: Fair    Frequency Min 3X/week   Barriers to discharge        Co-evaluation               AM-PAC PT "6 Clicks" Mobility  Outcome Measure Help needed turning from your back to your side while in a flat bed without using bedrails?: A Little Help needed moving from lying on your back to sitting on the side of a flat bed without using bedrails?: A Lot Help needed moving to and from a bed to a chair (including a wheelchair)?: A Lot Help needed standing up from a chair using your arms (e.g., wheelchair or bedside chair)?: A Lot Help needed to walk in hospital room?: Total Help needed climbing 3-5 steps with a railing? : Total 6 Click Score: 11    End of Session   Activity Tolerance: Patient limited by fatigue Patient left: in bed;with call bell/phone within reach;with bed alarm set Nurse Communication: Mobility status PT Visit Diagnosis: Unsteadiness on feet (R26.81);Other abnormalities of gait and mobility (R26.89);Muscle weakness (generalized) (M62.81)    Time: [id number] PT Time Calculation (min) (ACUTE ONLY): 13 min   Charges:   PT Evaluation $PT Eval Low Complexity: 1 Low          10:40 AM, 12/18/20 Mearl Latin PT, DPT Physical Therapist at [facility]

## 2020-12-18 NOTE — Discharge Summary (Addendum)
Physician Discharge Summary  Rick Cruz [id number] DOB: 08/17/1946 DOA: 12/13/2020  PCP: Vassie Moment, NP (Inactive)  Admit date: 12/13/2020 Discharge date: 12/19/2020  Time spent: 35 minutes  Recommendations for Outpatient Follow-up:  Repeat base metabolic panel to follow across renal function Continue constant reorientation Outpatient follow-up with PCP in the next 2 weeks Continue to follow CBGs with further adjustment to hypoglycemia regimen as required. Continue low-sodium (less than 2.5 gram daily) diet and daily weight.  Discharge Diagnoses:  Principal Problem:   Acute metabolic encephalopathy Active Problems:   Essential hypertension   Paranoid schizophrenia ([place])   CAD (coronary artery disease)   Chronic diastolic CHF (congestive heart failure) (HCC)   Uncontrolled type 2 diabetes mellitus with complication (HCC)   Paroxysmal atrial fibrillation (HCC)   Blind right eye   Chronic ischemic heart disease   Supplemental oxygen dependent   Ischemic cerebrovascular accident (CVA) ([place])   UTI (urinary tract infection)   Discharge Condition: Stable and improved.  Discharged back to SNF with instruction to follow-up with PCP in the next 2 weeks.  CODE STATUS: Full code.  Diet recommendation: Heart healthy modified carbohydrate diet.  Filed Weights   12/13/20 0701  Weight: 107.5 kg    History of present illness:  As per H&P written by Dr. Denton Cruz on 12/13/2020 [name] medical history significant ofosteoarthritis, history of other nonhemorrhagic stroke, right eye blindness, chronic diastolic heart failure, chronic respiratory failure due to COPD on home oxygen, sleep apnea noncompliant with BiPAP, history of tobacco abuse, type II DM, hyperlipidemia, hypertension, history of unspecified schizophreniawho presents from [facility] due to concerns about worsening confusion and combativeness and bizarre behavior, as per staff from SNF  patient apparently was eatinghis own stool -Patient tested positive for Covid more than 4 weeks ago -CTheadwithout acute findings -CMP remarkable for glucose of 270 creatinine is normal at 0.8 -CBC with a white count of 12.5 -Hemoglobin is 12.3 which is close to patient's baseline -UA suggestive of UTI,, UA also has yeast -EDP requested hospitalization for presumed UTI with altered mentation -Patient is a very poor historian but apparently no vomiting or diarrhea, and no fevers  Hospital Course:  Acute metabolic encephalopathy -In the setting of presumed UTI -Mentation has improved and back to baseline at time of discharge. -Continue constant reorientation and minimizing the use of medications that can alter mentation. -Evaluation by physical therapy requested and recommendations for skilled nursing facility received. -Patient discharged back to [facility] a skilled nursing facility on 12/18/2020.  UTI -Cultures demonstrating multiple microorganisms without specific isolated specie; positive for yeast. -Empirically treated with Rocephin and Diflucan -Patient has completed antibiotic therapy at time of discharge.  Essential hypertension -Stable overall -Continue current antihypertensive agents. -Low-sodium diet advised.  Paranoid schizophrenia ([place]) -Continue adjusted home psychotropic medications regimen. -No vegetations, combativeness of the use of any active restraints of the last 48-526 hours prior to discharge.  CAD (coronary artery disease) -No chest pain or shortness of breath -Continue risk factor modification. -No acute ischemic abnormalities appreciated on EKG.  Chronic diastolic CHF (congestive heart failure) (HCC) -Resume home medications to continue providing volume stability.   -Encouraged to follow low-sodium diet and to check his weight on daily basis.  Uncontrolled type 2 diabetes mellitus with complication (HCC) -Continue to follow CBGs and adjust  hypoglycemic regimen as needed -Hypoglycemic regimen (using Lantus and Trulicity). -Advised to follow modified carbohydrate diet. Paroxysmal atrial fibrillation (HCC) -Continue Eliquis for secondary prevention -Rate controlled at this time.  Class II obesity, chronic hypoxic and hypercapnic respiratory failure and obstructive sleep apnea -Continue home oxygen supplementation -Patient noncompliant with CPAP as an outpatient. -Continue as needed bronchodilators. -Body mass index is 32.14 kg/m.  Ischemic cerebrovascular accident (CVA) ([place]) -Continue Eliquis for secondary prevention -CT scan of the head on admission not demonstrating acute intracranial abnormalities. -Continue risk factor modification.   Procedures:  See below for x-ray reports.  Consultations:  None  Discharge Exam: Vitals:   12/18/20 1300 12/18/20 1317  BP: 130/78 123/64  Pulse: 81 76  Resp: 16 16  Temp: 98 F (36.7 C) 98.1 F (36.7 C)  SpO2: 95% 95%    General exam: Alert, awake, oriented x 2; following commands appropriately.  No agitation or combativeness reported.  Tolerating oral diet and is feeling weak and deconditioned. Respiratory system: Good air movement bilaterally, no wheezing, no crackles, using 2 L of cannula supplementation intermittently. Cardiovascular system: Positive systolic murmur; no rate control.  No rubs or  gallops.  No JVD. Gastrointestinal system: Abdomen is nondistended, soft and nontender. No organomegaly or masses felt. Normal bowel sounds heard. Central nervous system: Alert and oriented. No focal neurological deficits. Extremities: No cyanosis or clubbing. Skin: No petechiae. Psychiatry: Mood & affect appropriate.   Discharge Instructions    Allergies as of 12/18/2020      Reactions   Phenergan [promethazine Hcl] Other (See Comments)   Becomes very confused and aggressive   Haldol [haloperidol Lactate] Other (See Comments)   Shaking    Metformin And Related  Diarrhea      Medication List    STOP taking these medications   cephALEXin 500 MG capsule Commonly known as: KEFLEX   LORazepam 0.5 MG tablet Commonly known as: ATIVAN   simvastatin 40 MG tablet Commonly known as: ZOCOR     TAKE these medications   acetaminophen 325 MG tablet Commonly known as: TYLENOL Take 2 tablets (650 mg total) by mouth every 6 (six) hours as needed for mild pain, fever or headache (or Fever >/= 101).   albuterol (2.5 MG/3ML) 0.083% nebulizer solution Commonly known as: PROVENTIL Take 3 mLs (2.5 mg total) by nebulization every 6 (six) hours as needed for wheezing or shortness of breath.   apixaban 5 MG Tabs tablet Commonly known as: ELIQUIS Take 1 tablet (5 mg total) by mouth 2 (two) times daily.   bacitracin-polymyxin b ointment Commonly known as: POLYSPORIN Apply 1 application topically 2 (two) times daily.   carboxymethylcellulose 0.5 % Soln Commonly known as: REFRESH PLUS Apply 1 drop to eye 3 (three) times daily as needed.   cholecalciferol 1000 units tablet Commonly known as: VITAMIN D Take 1,000 Units by mouth 2 (two) times daily.   docusate sodium 100 MG capsule Commonly known as: COLACE Take 100 mg by mouth 2 (two) times daily.   DULoxetine 30 MG capsule Commonly known as: CYMBALTA Take 1 capsule (30 mg total) by mouth every morning.   gabapentin 300 MG capsule Commonly known as: NEURONTIN Take 1 capsule (300 mg total) by mouth at bedtime.   hydrOXYzine 25 MG tablet Commonly known as: ATARAX/VISTARIL Take 1 tablet (25 mg total) by mouth every 12 (twelve) hours as needed for anxiety or itching (Anxiety and insomnia or restlessness).   insulin aspart 100 UNIT/ML injection Commonly known as: novoLOG Inject 0-15 Units into the skin 3 (three) times daily with meals.   Lantus SoloStar 100 UNIT/ML Solostar Pen Generic drug: insulin glargine Inject 20 Units into the skin at bedtime.  melatonin 3 MG Tabs tablet Take 6 mg by  mouth at bedtime.   metoprolol tartrate 25 MG tablet Commonly known as: LOPRESSOR Take 1 tablet (25 mg total) by mouth 2 (two) times daily.   omeprazole 20 MG capsule Commonly known as: PRILOSEC Take 20 mg by mouth 2 (two) times daily before a meal.   OXYGEN Inhale 3 L into the lungs continuous.   PRESERVISION AREDS 2+MULTI VIT PO Take 2 tablets by mouth in the morning and at bedtime. What changed: Another medication with the same name was removed. Continue taking this medication, and follow the directions you see here.   QUEtiapine 25 MG tablet Commonly known as: SEROQUEL Take 1 tablet (25 mg total) by mouth 2 (two) times daily. What changed:   medication strength  how much to take  when to take this  Another medication with the same name was removed. Continue taking this medication, and follow the directions you see here.   rosuvastatin 10 MG tablet Commonly known as: CRESTOR Take 10 mg by mouth daily.   tiotropium 18 MCG inhalation capsule Commonly known as: SPIRIVA Place 1 capsule (18 mcg total) into inhaler and inhale daily.   Trelegy Ellipta 100-62.5-25 MCG/INH Aepb Generic drug: Fluticasone-Umeclidin-Vilant Inhale 1 puff into the lungs daily.   triamcinolone 0.1 % Commonly known as: KENALOG Apply 1 application topically 2 (two) times daily.   Trulicity 3 0000000 Sopn Generic drug: Dulaglutide Inject 3 mg into the skin every Friday.      Allergies  Allergen Reactions  . Phenergan [Promethazine Hcl] Other (See Comments)    Becomes very confused and aggressive  . Haldol [Haloperidol Lactate] Other (See Comments)    Shaking   . Metformin And Related Diarrhea    Contact information for follow-up providers    Vassie Moment, NP. Schedule an appointment as soon as possible for a visit in 2 week(s).   Specialty: Internal Medicine Contact information: [address] [place] [address] [phone]            Contact  information for after-discharge care    [facility] .   Service: Skilled Nursing Contact information: [address] [place] [phone]                  The results of significant diagnostics from this hospitalization (including imaging, microbiology, ancillary and laboratory) are listed below for reference.    Significant Diagnostic Studies: CT Head Wo Contrast  Result Date: 12/13/2020 CLINICAL DATA:  Altered mental status. EXAM: CT HEAD WITHOUT CONTRAST TECHNIQUE: Contiguous axial images were obtained from the base of the skull through the vertex without intravenous contrast. COMPARISON:  November 10, 2020. FINDINGS: Brain: Mild diffuse cortical atrophy is noted. Mild chronic ischemic white matter disease is noted. No mass effect or midline shift is noted. Ventricular size is within normal limits. There is no evidence of mass lesion, hemorrhage or acute infarction. Vascular: No hyperdense vessel or unexpected calcification. Skull: Normal. Negative for fracture or focal lesion. Sinuses/Orbits: No acute finding. Other: None. IMPRESSION: Mild diffuse cortical atrophy. Mild chronic ischemic white matter disease. No acute intracranial abnormality seen. Electronically Signed   By: Marijo Conception M.D.   On: 12/13/2020 12:20    Microbiology: Recent Results (from the past 240 hour(s))  Urine culture     Status: Abnormal   Collection Time: 12/13/20 10:04 AM   Specimen: Urine, Clean Catch  Result Value Ref  Range Status   Specimen Description   Final    URINE, CLEAN CATCH Performed at [facility], [address] [place], [place] [address]    Special Requests   Final    NONE Performed at [facility], [address], [place], [place] [address]    Culture MULTIPLE SPECIES PRESENT, SUGGEST RECOLLECTION (A)  Final   Report Status 12/14/2020 FINAL  Final  Culture, blood (Routine X 2) w Reflex to ID Panel      Status: None   Collection Time: 12/13/20  2:07 PM   Specimen: BLOOD  Result Value Ref Range Status   Specimen Description BLOOD BLOOD RIGHT HAND  Final   Special Requests   Final    Blood Culture results may not be optimal due to an inadequate volume of blood received in culture bottles BOTTLES DRAWN AEROBIC AND ANAEROBIC   Culture   Final    NO GROWTH 5 DAYS Performed at [facility], [address], [place], [place] [address]    Report Status 12/18/2020 FINAL  Final  Culture, blood (Routine X 2) w Reflex to ID Panel     Status: None (Preliminary result)   Collection Time: 12/14/20  6:06 AM   Specimen: BLOOD  Result Value Ref Range Status   Specimen Description BLOOD BLOOD RIGHT HAND  Final   Special Requests   Final    Blood Culture adequate volume BOTTLES DRAWN AEROBIC AND ANAEROBIC   Culture   Final    NO GROWTH 4 DAYS Performed at [facility], [address] [place], [place] [address]    Report Status PENDING  Incomplete     Labs: Basic Metabolic Panel: Recent Labs  Lab 12/13/20 0719 12/14/20 0606 12/16/20 0430  NA 136 137 141  K 4.5 4.7 4.1  CL 97* 100 105  CO2 '31 28 27  '$ GLUCOSE 270* 215* 141*  BUN '8 10 12  '$ CREATININE 0.81 0.90 0.97  CALCIUM 9.2 9.2 9.2   Liver Function Tests: Recent Labs  Lab 12/13/20 0719  AST 13*  ALT 13  ALKPHOS 81  BILITOT 0.3  PROT 6.6  ALBUMIN 3.0*   CBC: Recent Labs  Lab 12/13/20 0719 12/14/20 0606 12/16/20 0430  WBC 12.5* 9.6 8.3  NEUTROABS 8.9*  --   --   HGB 12.3* 11.8* 11.1*  HCT 41.0 39.7 38.1*  MCV 82.7 82.2 84.3  PLT 333 295 269   CBG: Recent Labs  Lab 12/17/20 1110 12/17/20 1610 12/17/20 2101 12/18/20 0726 12/18/20 1109  GLUCAP 161* 139* 146* 128* 168*    Signed:  Barton Dubois MD.  [facility] 12/18/2020, 3:36 PM

## 2020-12-18 NOTE — TOC Progression Note (Addendum)
Transition of Care [facility]) - Progression Note    Patient Details  Name: Rick Cruz MRN: [id number] Date of Birth: 28-Jan-1946  [facility] CM/SW Contact  Natasha Bence, LCSW Phone Number: 12/18/2020, 5:14 PM  Clinical Narrative:    CSW not able to start auth on 04/02 due to PT not able to see patient. CSW started auth on 04/03 when PT note recieved. Navi reported that based on PT notes submitted by [facility] when terminating SNF in February, it appeared that patient is most likely at baseline, but they will have Medical Director review patient. Patient also discontinuing antibiotics on 04/03 so patient is not able to receive auth for RN needs. CSW contacted Jackelyn Poling to inquire if she is agreeable to take patient back on 04/03 given that patient is most likely not able to receive auth so that discharge is not delayed further. CSW received No Ans 2x when calls were placed. CSW consulted supervisor on call Boris Lown. Supervisor reported that she is in [place] of message being left with Debbie. CSW sent message to [place] with [facility] detailing CSW's conversation with Navi. CSW received No answer.    Expected Discharge Plan: Skilled Nursing Facility Barriers to Discharge: Continued Medical Work up  Expected Discharge Plan and Services Expected Discharge Plan: [place] arrangements for the past 2 months: [place]                                       Social Determinants of Health (SDOH) Interventions    Readmission Risk Interventions Readmission Risk Prevention Plan 12/15/2020  Transportation Screening Complete  PCP or Specialist Appt within 3-5 Days Complete  HRI or [place] Complete  Social Work Consult for [place] Planning/Counseling Complete  Palliative Care Screening Not Applicable  Medication Review Press photographer) Complete  Some recent data might be hidden

## 2020-12-19 LAB — CULTURE, BLOOD (ROUTINE X 2)
Culture: NO GROWTH
Special Requests: ADEQUATE

## 2020-12-19 LAB — GLUCOSE, CAPILLARY
Glucose-Capillary: 160 mg/dL — ABNORMAL HIGH (ref 70–99)
Glucose-Capillary: 210 mg/dL — ABNORMAL HIGH (ref 70–99)
Glucose-Capillary: 136 mg/dL — ABNORMAL HIGH (ref 70–99)

## 2020-12-19 NOTE — Patient Outreach (Signed)
Initially, patient seemed confused.  Offered spiritual support, prayer

## 2020-12-19 NOTE — Progress Notes (Signed)
Patient seen and examined.  Hemodynamically stable and in no distress.  No barriers to discharge from the skilled nursing facility standpoint has been resolved and he is ready to go home for further care and rehabilitation.  Please refer to discharge summary dictated on 12/18/2020 for further info/details.  No changes required.  He has completed antibiotics while hospitalized and there is no anticipated need of antibiotics at discharge.   Barton Dubois MD [phone]

## 2020-12-19 NOTE — Care Management Important Message (Signed)
Important Message  Patient Details  Name: Rick Cruz MRN: [id number] Date of Birth: 1945-10-15   Medicare Important Message Given:        Tommy Medal 12/19/2020, 3:22 PM

## 2020-12-19 NOTE — TOC Transition Note (Signed)
Transition of Care [facility]) - CM/SW Discharge Note   Patient Details  Name: Rick Cruz MRN: [id number] Date of Birth: December 15, 1945  [facility]) CM/SW Contact:  Boneta Lucks, RN Phone Number: 12/19/2020, 2:54 PM   Clinical Narrative:   Patient is medically ready to discharge back to [facility] today. Helene Kelp provided room A3 Bed 2, RN called report, EMS scheduled, Medical necessity printed. Documents sent to [facility] in the hub.     Final next level of care: Skilled Nursing Facility Barriers to Discharge: Barriers Resolved   Patient Goals and CMS Choice Patient states their goals for this hospitalization and ongoing recovery are:: to go back to SNF. CMS Medicare.gov Compare Post Acute Care list provided to:: Patient Represenative (must comment) Choice offered to / list presented to : Spouse  Discharge Placement              Patient chooses bed at:  [facility]) Patient to be transferred to facility by: EMS   Patient and family notified of of transfer: 12/19/20  Discharge Plan and Services     Readmission Risk Interventions Readmission Risk Prevention Plan 12/19/2020 12/15/2020  Transportation Screening Complete Complete  PCP or Specialist Appt within 3-5 Days Complete Complete  HRI or Home Care Consult Complete Complete  Social Work Consult for [place] Planning/Counseling Complete Complete  Palliative Care Screening Not Applicable Not Applicable  Medication Review Press photographer) Complete Complete  Some recent data might be hidden

## 2020-12-20 DIAGNOSIS — R2689 Other abnormalities of gait and mobility: Secondary | ICD-10-CM | POA: Diagnosis not present

## 2020-12-20 DIAGNOSIS — R278 Other lack of coordination: Secondary | ICD-10-CM | POA: Diagnosis not present

## 2020-12-20 DIAGNOSIS — I639 Cerebral infarction, unspecified: Secondary | ICD-10-CM | POA: Diagnosis not present

## 2020-12-20 DIAGNOSIS — M6281 Muscle weakness (generalized): Secondary | ICD-10-CM | POA: Diagnosis not present

## 2020-12-21 DIAGNOSIS — M6281 Muscle weakness (generalized): Secondary | ICD-10-CM | POA: Diagnosis not present

## 2020-12-21 DIAGNOSIS — R2689 Other abnormalities of gait and mobility: Secondary | ICD-10-CM | POA: Diagnosis not present

## 2020-12-21 DIAGNOSIS — I639 Cerebral infarction, unspecified: Secondary | ICD-10-CM | POA: Diagnosis not present

## 2020-12-21 DIAGNOSIS — R278 Other lack of coordination: Secondary | ICD-10-CM | POA: Diagnosis not present

## 2020-12-22 DIAGNOSIS — Z87440 Personal history of urinary (tract) infections: Secondary | ICD-10-CM | POA: Diagnosis not present

## 2020-12-22 DIAGNOSIS — R2689 Other abnormalities of gait and mobility: Secondary | ICD-10-CM | POA: Diagnosis not present

## 2020-12-22 DIAGNOSIS — R278 Other lack of coordination: Secondary | ICD-10-CM | POA: Diagnosis not present

## 2020-12-22 DIAGNOSIS — M6281 Muscle weakness (generalized): Secondary | ICD-10-CM | POA: Diagnosis not present

## 2020-12-22 DIAGNOSIS — R5381 Other malaise: Secondary | ICD-10-CM | POA: Diagnosis not present

## 2020-12-22 DIAGNOSIS — I639 Cerebral infarction, unspecified: Secondary | ICD-10-CM | POA: Diagnosis not present

## 2020-12-23 DIAGNOSIS — R278 Other lack of coordination: Secondary | ICD-10-CM | POA: Diagnosis not present

## 2020-12-23 DIAGNOSIS — M6281 Muscle weakness (generalized): Secondary | ICD-10-CM | POA: Diagnosis not present

## 2020-12-23 DIAGNOSIS — I639 Cerebral infarction, unspecified: Secondary | ICD-10-CM | POA: Diagnosis not present

## 2020-12-23 DIAGNOSIS — R2689 Other abnormalities of gait and mobility: Secondary | ICD-10-CM | POA: Diagnosis not present

## 2020-12-26 ENCOUNTER — Encounter: Payer: Self-pay | Admitting: Orthopedic Surgery

## 2020-12-26 ENCOUNTER — Telehealth: Payer: Self-pay | Admitting: Orthopedic Surgery

## 2020-12-26 ENCOUNTER — Ambulatory Visit (INDEPENDENT_AMBULATORY_CARE_PROVIDER_SITE_OTHER): Payer: Medicare Other | Admitting: Orthopedic Surgery

## 2020-12-26 ENCOUNTER — Other Ambulatory Visit: Payer: Self-pay

## 2020-12-26 VITALS — Ht 72.0 in

## 2020-12-26 DIAGNOSIS — I639 Cerebral infarction, unspecified: Secondary | ICD-10-CM | POA: Diagnosis not present

## 2020-12-26 DIAGNOSIS — S76112S Strain of left quadriceps muscle, fascia and tendon, sequela: Secondary | ICD-10-CM | POA: Diagnosis not present

## 2020-12-26 NOTE — Progress Notes (Signed)
Follow up;  Chief Complaint  Patient presents with  . Knee Pain    Left knee, patient reports doing some better    DOI: ??? FOV 2/28   This is a follow-up visit for this 75 year old male with diabetes COPD congestive heart failure on Eliquis oxygen dependent who has a history of multiple falls had prior procedures on his left knee but fell again back in February and needed left knee again  Questionable whether he has a new quadriceps injury or chronic.   We decided that this was most likely chronic problem.  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

## 2020-12-26 NOTE — Telephone Encounter (Signed)
Left number for her to call me back.

## 2020-12-26 NOTE — Telephone Encounter (Signed)
Call received from patient's wife and POA, Bennette Manigault, [phone] (said she currently is staying in [place], and was unable to come with him.* Copy of POA received today, and is to be sent for scanning. She said husband does not recall coming today to see Dr Aline Brochure. Please advise.

## 2020-12-27 DIAGNOSIS — I639 Cerebral infarction, unspecified: Secondary | ICD-10-CM | POA: Diagnosis not present

## 2020-12-27 DIAGNOSIS — G9341 Metabolic encephalopathy: Secondary | ICD-10-CM | POA: Diagnosis not present

## 2020-12-27 DIAGNOSIS — M6281 Muscle weakness (generalized): Secondary | ICD-10-CM | POA: Diagnosis not present

## 2020-12-27 DIAGNOSIS — R2689 Other abnormalities of gait and mobility: Secondary | ICD-10-CM | POA: Diagnosis not present

## 2020-12-27 DIAGNOSIS — R278 Other lack of coordination: Secondary | ICD-10-CM | POA: Diagnosis not present

## 2020-12-27 NOTE — Telephone Encounter (Signed)
Called again mail box full

## 2020-12-27 NOTE — Telephone Encounter (Signed)
I called and we discussed  Told her he seemed to have declined since he was here previously and we need to know who did previous surgery, so we can try to get him back there to see his leg.

## 2020-12-28 ENCOUNTER — Telehealth: Payer: Self-pay | Admitting: Orthopedic Surgery

## 2020-12-28 NOTE — Telephone Encounter (Signed)
Patient's wife called and said she spoke to Tourist information centre manager at [employer].  He was seen by Dr Latanya Maudlin at [facility] previously.  She said that she and Ana have scheduled Rick Cruz for 01/06/21 at 2:45.  She just wanted to let you know this.  She said if you have any questions, please give her a call.  Thanks

## 2020-12-28 NOTE — Telephone Encounter (Signed)
To you FYI I spoke to his wife after visit this week, and patients wife has advised he previously has seen Dr Rhona Raider at [facility] has set him up an appointment there on 01/06/21

## 2020-12-30 DIAGNOSIS — M6281 Muscle weakness (generalized): Secondary | ICD-10-CM | POA: Diagnosis not present

## 2020-12-30 DIAGNOSIS — R2689 Other abnormalities of gait and mobility: Secondary | ICD-10-CM | POA: Diagnosis not present

## 2020-12-30 DIAGNOSIS — R278 Other lack of coordination: Secondary | ICD-10-CM | POA: Diagnosis not present

## 2020-12-30 DIAGNOSIS — I639 Cerebral infarction, unspecified: Secondary | ICD-10-CM | POA: Diagnosis not present

## 2020-12-31 DIAGNOSIS — R278 Other lack of coordination: Secondary | ICD-10-CM | POA: Diagnosis not present

## 2020-12-31 DIAGNOSIS — R2689 Other abnormalities of gait and mobility: Secondary | ICD-10-CM | POA: Diagnosis not present

## 2020-12-31 DIAGNOSIS — M6281 Muscle weakness (generalized): Secondary | ICD-10-CM | POA: Diagnosis not present

## 2020-12-31 DIAGNOSIS — I639 Cerebral infarction, unspecified: Secondary | ICD-10-CM | POA: Diagnosis not present

## 2021-01-02 DIAGNOSIS — R278 Other lack of coordination: Secondary | ICD-10-CM | POA: Diagnosis not present

## 2021-01-02 DIAGNOSIS — I639 Cerebral infarction, unspecified: Secondary | ICD-10-CM | POA: Diagnosis not present

## 2021-01-02 DIAGNOSIS — M6281 Muscle weakness (generalized): Secondary | ICD-10-CM | POA: Diagnosis not present

## 2021-01-02 DIAGNOSIS — R2689 Other abnormalities of gait and mobility: Secondary | ICD-10-CM | POA: Diagnosis not present

## 2021-01-03 ENCOUNTER — Encounter: Payer: Self-pay | Admitting: Vascular Surgery

## 2021-01-03 ENCOUNTER — Ambulatory Visit (INDEPENDENT_AMBULATORY_CARE_PROVIDER_SITE_OTHER): Payer: Medicare Other | Admitting: Vascular Surgery

## 2021-01-03 ENCOUNTER — Other Ambulatory Visit: Payer: Self-pay

## 2021-01-03 ENCOUNTER — Ambulatory Visit (HOSPITAL_COMMUNITY)
Admission: RE | Admit: 2021-01-03 | Discharge: 2021-01-03 | Disposition: A | Discharge status: Home / Self Care | Payer: Medicare Other | Source: Ambulatory Visit | Attending: Vascular Surgery | Admitting: Vascular Surgery

## 2021-01-03 VITALS — BP 122/83 | HR 54 | Temp 97.7°F | Resp 18 | Ht 72.0 in | Wt 243.0 lb

## 2021-01-03 DIAGNOSIS — I739 Peripheral vascular disease, unspecified: Secondary | ICD-10-CM | POA: Insufficient documentation

## 2021-01-03 DIAGNOSIS — I639 Cerebral infarction, unspecified: Secondary | ICD-10-CM | POA: Diagnosis not present

## 2021-01-03 DIAGNOSIS — R2689 Other abnormalities of gait and mobility: Secondary | ICD-10-CM | POA: Diagnosis not present

## 2021-01-03 DIAGNOSIS — R278 Other lack of coordination: Secondary | ICD-10-CM | POA: Diagnosis not present

## 2021-01-03 DIAGNOSIS — M6281 Muscle weakness (generalized): Secondary | ICD-10-CM | POA: Diagnosis not present

## 2021-01-03 NOTE — Progress Notes (Signed)
[facility] [place]  ASSESSMENT / PLAN: Rick Cruz is a 75 y.o. male with atherosclerosis of native arteries of bilateral lower extremities causing no symptoms. Mild chronic venous insufficiency of bilateral lower extremities.  Patient counseled patients with asymptomatic peripheral arterial disease or claudication have a 1-2% risk of developing chronic limb threatening ischemia, but a 15-30% risk of mortality in the next 5 years. Intervention should only be considered for medically optimized patients with disabling symptoms. .  Recommend the following which can slow the progression of atherosclerosis and reduce the risk of major adverse cardiac / limb events:  Complete cessation from all tobacco products. Blood glucose control with goal A1c < 7%. Blood pressure control with goal blood pressure < 140/90 mmHg. Lipid reduction therapy with goal LDL-C <100 mg/dL (<70 if symptomatic from PAD).  Aspirin '81mg'$  PO QD.  Atorvastatin 40-'80mg'$  PO QD (or other "high intensity" statin therapy). Compression and elevation of lower extremities for venous insufficiency.  Not a candidate for revascularization. Follow up with me as needed.  CHIEF COMPLAINT: "diminished pulses"  HISTORY OF PRESENT ILLNESS: Rick Cruz is a 75 y.o. male referred for evaluation of peripheral arterial disease.  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  She presents with a caretaker.  He is minimally ambulatory.  He is able to walk a few steps but not much more than this.  He does not walk far enough to claudicate.  He denies symptoms typical of ischemic rest pain.  He denies ischemic ulceration.  He does have bluish discoloration about his feet.  He does have eczema-like rash about his ankles bilaterally.  Past Medical History:  Diagnosis Date  . Arthritis   . 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 CHF (congestive heart failure) (HCC)    diastolic   . Chronic pain   . COPD  (chronic obstructive pulmonary disease) ([place])   . Depression   . Diabetes mellitus without complication (HCC)    insulin pump  . Hypercholesterolemia   . Hypertension   . On home O2    3 liters  . Oxygen deficiency   . Schizophrenia ([place])   . Sleep apnea    noncompliant with BiPAP  . Sleep apnea   . Tobacco abuse     Past Surgical History:  Procedure Laterality Date  . COLON SURGERY  March 2010   secondary to large colon polyp, final path per discharge summary notes tubulovillous adenoma.   . COLONOSCOPY  July 2011   Dr. Benson Norway: multiple polyps, internal and external hemorrhoids, diverticulosis. tubular adenoma  . ESOPHAGOGASTRODUODENOSCOPY (EGD) WITH PROPOFOL N/A 04/20/2013   Procedure: ESOPHAGOGASTRODUODENOSCOPY (EGD) WITH PROPOFOL;  Surgeon: Daneil Dolin, MD;  Location: [facility];  Service: Endoscopy;  Laterality: N/A;  . KNEE SURGERY     X2    Family History  Problem Relation Age of Onset  . Thyroid disease Mother   . Parkinson's disease Father   . Parkinson's disease Other   . Colon cancer Neg Hx     Social History   Socioeconomic History  . Marital status: Married    Spouse name: Not on file  . Number of children: Not on file  . Years of education: Not on file  . Highest education level: Not on file  Occupational History  . Occupation: Retired  Tobacco Use  . Smoking status: Former Smoker    Packs/day: 1.00    Years: 50.00    Pack  years: 50.00    Types: Cigarettes    Quit date: 09/16/2013    Years since quitting: 7.3  . Smokeless tobacco: Never Used  Substance and Sexual Activity  . Alcohol use: No    Comment: history of ETOH abuse in remote past, none in at least 10 years  . Drug use: No  . Sexual activity: Yes  Other Topics Concern  . Not on file  Social History Narrative   Drinks about 4 cups of caffeine daily. 2 cups of coffee and 2 coke zeros.  Pt lives in [place] with wife [name] [phone]) and mother.   Social Determinants of Health    Financial Resource Strain: Not on file  Food Insecurity: Not on file  Transportation Needs: Not on file  Physical Activity: Not on file  Stress: Not on file  Social Connections: Not on file  Intimate Partner Violence: Not on file    Allergies  Allergen Reactions  . Phenergan [Promethazine Hcl] Other (See Comments)    Becomes very confused and aggressive  . Haldol [Haloperidol Lactate] Other (See Comments)    Shaking   . Metformin And Related Diarrhea    Current Outpatient Medications  Medication Sig Dispense Refill  . acetaminophen (TYLENOL) 325 MG tablet Take 2 tablets (650 mg total) by mouth every 6 (six) hours as needed for mild pain, fever or headache (or Fever >/= 101). 30 tablet 2  . albuterol (PROVENTIL) (2.5 MG/3ML) 0.083% nebulizer solution Take 3 mLs (2.5 mg total) by nebulization every 6 (six) hours as needed for wheezing or shortness of breath. 75 mL 0  . apixaban (ELIQUIS) 5 MG TABS tablet Take 1 tablet (5 mg total) by mouth 2 (two) times daily. 60 tablet 1  . bacitracin-polymyxin b (POLYSPORIN) ointment Apply 1 application topically 2 (two) times daily.    . carboxymethylcellulose (REFRESH PLUS) 0.5 % SOLN Apply 1 drop to eye 3 (three) times daily as needed.    . cholecalciferol (VITAMIN D) 1000 UNITS tablet Take 1,000 Units by mouth 2 (two) times daily.    Marland Kitchen docusate sodium (COLACE) 100 MG capsule Take 100 mg by mouth 2 (two) times daily.    . DULoxetine (CYMBALTA) 30 MG capsule Take 1 capsule (30 mg total) by mouth every morning. 30 capsule 3  . Fluticasone-Umeclidin-Vilant (TRELEGY ELLIPTA) 100-62.5-25 MCG/INH AEPB Inhale 1 puff into the lungs daily. 28 each 5  . gabapentin (NEURONTIN) 300 MG capsule Take 1 capsule (300 mg total) by mouth at bedtime.    . hydrOXYzine (ATARAX/VISTARIL) 25 MG tablet Take 1 tablet (25 mg total) by mouth every 12 (twelve) hours as needed for anxiety or itching (Anxiety and insomnia or restlessness). 30 tablet 0  . insulin aspart  (NOVOLOG) 100 UNIT/ML injection Inject 0-15 Units into the skin 3 (three) times daily with meals. 10 mL 11  . insulin glargine (LANTUS SOLOSTAR) 100 UNIT/ML Solostar Pen Inject 20 Units into the skin at bedtime.    . melatonin 3 MG TABS tablet Take 6 mg by mouth at bedtime.    . metoprolol tartrate (LOPRESSOR) 25 MG tablet Take 1 tablet (25 mg total) by mouth 2 (two) times daily.    . Multiple Vitamins-Minerals (PRESERVISION AREDS 2+MULTI VIT PO) Take 2 tablets by mouth in the morning and at bedtime.    Marland Kitchen omeprazole (PRILOSEC) 20 MG capsule Take 20 mg by mouth 2 (two) times daily before a meal.    . OXYGEN Inhale 3 L into the lungs continuous.    Marland Kitchen  QUEtiapine (SEROQUEL) 25 MG tablet Take 1 tablet (25 mg total) by mouth 2 (two) times daily.    . rosuvastatin (CRESTOR) 10 MG tablet Take 10 mg by mouth daily.    Marland Kitchen tiotropium (SPIRIVA) 18 MCG inhalation capsule Place 1 capsule (18 mcg total) into inhaler and inhale daily. 30 capsule 12  . triamcinolone cream (KENALOG) 0.1 % Apply 1 application topically 2 (two) times daily. 30 g 0  . TRULICITY 3 0000000 SOPN Inject 3 mg into the skin every Friday.     No current facility-administered medications for this visit.    REVIEW OF SYSTEMS:  '[X]'$  denotes positive finding, '[ ]'$  denotes negative finding Cardiac  Comments:  Chest pain or chest pressure:    Shortness of breath upon exertion:    Short of breath when lying flat:    Irregular heart rhythm:        Vascular    Pain in calf, thigh, or hip brought on by ambulation:    Pain in feet at night that wakes you up from your sleep:     Blood clot in your veins:    Leg swelling:         Pulmonary    Oxygen at home:    Productive cough:     Wheezing:         Neurologic    Sudden weakness in arms or legs:     Sudden numbness in arms or legs:     Sudden onset of difficulty speaking or slurred speech:    Temporary loss of vision in one eye:     Problems with dizziness:         Gastrointestinal     Blood in stool:     Vomited blood:         Genitourinary    Burning when urinating:     Blood in urine:        Psychiatric    Major depression:         Hematologic    Bleeding problems:    Problems with blood clotting too easily:        Skin    Rashes or ulcers:        Constitutional    Fever or chills:      PHYSICAL EXAM  Vitals:   01/03/21 1428  BP: 122/83  Pulse: (!) 54  Resp: 18  Temp: 97.7 F (36.5 C)  TempSrc: Temporal  SpO2: 98%  Weight: 243 lb (110.2 kg)  Height: 6' (1.829 m)    Constitutional: Appears older than stated age. No distress. Appears well nourished.  Neurologic: CN intact. no focal findings. no sensory loss. Psychiatric: Blunt affect. Limited response to questions. Eyes: No icterus. No conjunctival pallor. Ears, nose, throat: mucous membranes moist. Midline trachea.  Cardiac: regular rate and rhythm.  Respiratory: unlabored. Abdominal: soft, non-tender, non-distended.  Peripheral vascular:  No palpable pedal pulses  <3s capillary refill  Mild cyanosis about feet bilaterally  Venous eczema Extremity: No edema. No cyanosis. No pallor.  Skin: No gangrene. No ulceration.  Lymphatic: No Stemmer's sign. No palpable lymphadenopathy.  PERTINENT LABORATORY AND RADIOLOGIC DATA  Most recent CBC CBC Latest Ref Rng & Units 12/16/2020 12/14/2020 12/13/2020  WBC 4.0 - 10.5 K/uL 8.3 9.6 12.5(H)  Hemoglobin 13.0 - 17.0 g/dL 11.1(L) 11.8(L) 12.3(L)  Hematocrit 39.0 - 52.0 % 38.1(L) 39.7 41.0  Platelets 150 - 400 K/uL 269 295 333     Most recent CMP CMP Latest Ref Rng &  Units 12/16/2020 12/14/2020 12/13/2020  Glucose 70 - 99 mg/dL 141(H) 215(H) 270(H)  BUN 8 - 23 mg/dL '12 10 8  '$ Creatinine 0.61 - 1.24 mg/dL 0.97 0.90 0.81  Sodium 135 - 145 mmol/L 141 137 136  Potassium 3.5 - 5.1 mmol/L 4.1 4.7 4.5  Chloride 98 - 111 mmol/L 105 100 97(L)  CO2 22 - 32 mmol/L '27 28 31  '$ Calcium 8.9 - 10.3 mg/dL 9.2 9.2 9.2  Total Protein 6.5 - 8.1 g/dL - - 6.6  Total  Bilirubin 0.3 - 1.2 mg/dL - - 0.3  Alkaline Phos 38 - 126 U/L - - 81  AST 15 - 41 U/L - - 13(L)  ALT 0 - 44 U/L - - 13    Renal function Estimated Creatinine Clearance: 85.6 mL/min (by C-G formula based on SCr of 0.97 mg/dL).  Hgb A1c MFr Bld (%)  Date Value  12/13/2020 8.6 (H)    LDL Cholesterol  Date Value Ref Range Status  10/12/2020 88 0 - 99 mg/dL Final    Comment:           Total Cholesterol/HDL:CHD Risk Coronary Heart Disease Risk Table                     Men   Women  1/2 Average Risk   3.4   3.3  Average Risk       5.0   4.4  2 X Average Risk   9.6   7.1  3 X Average Risk  23.4   11.0        Use the calculated Patient Ratio above and the CHD Risk Table to determine the patient's CHD Risk.        ATP III CLASSIFICATION (LDL):  <100     mg/dL   Optimal  100-129  mg/dL   Near or Above                    Optimal  130-159  mg/dL   Borderline  160-189  mg/dL   High  >190     mg/dL   Very High Performed at [facility], [address], [place], [name] [address]      Vascular Imaging: ABI 01/03/21   Yevonne Aline. Stanford Breed, MD [facility] Phone Number: [phone] 01/03/2021 3:34 PM

## 2021-01-04 DIAGNOSIS — R2689 Other abnormalities of gait and mobility: Secondary | ICD-10-CM | POA: Diagnosis not present

## 2021-01-04 DIAGNOSIS — F32A Depression, unspecified: Secondary | ICD-10-CM | POA: Diagnosis not present

## 2021-01-04 DIAGNOSIS — I639 Cerebral infarction, unspecified: Secondary | ICD-10-CM | POA: Diagnosis not present

## 2021-01-04 DIAGNOSIS — G4700 Insomnia, unspecified: Secondary | ICD-10-CM | POA: Diagnosis not present

## 2021-01-04 DIAGNOSIS — E785 Hyperlipidemia, unspecified: Secondary | ICD-10-CM | POA: Diagnosis not present

## 2021-01-04 DIAGNOSIS — E1169 Type 2 diabetes mellitus with other specified complication: Secondary | ICD-10-CM | POA: Diagnosis not present

## 2021-01-04 DIAGNOSIS — I10 Essential (primary) hypertension: Secondary | ICD-10-CM | POA: Diagnosis not present

## 2021-01-04 DIAGNOSIS — M6281 Muscle weakness (generalized): Secondary | ICD-10-CM | POA: Diagnosis not present

## 2021-01-04 DIAGNOSIS — K219 Gastro-esophageal reflux disease without esophagitis: Secondary | ICD-10-CM | POA: Diagnosis not present

## 2021-01-04 DIAGNOSIS — R278 Other lack of coordination: Secondary | ICD-10-CM | POA: Diagnosis not present

## 2021-01-04 DIAGNOSIS — Z79899 Other long term (current) drug therapy: Secondary | ICD-10-CM | POA: Diagnosis not present

## 2021-01-05 DIAGNOSIS — R278 Other lack of coordination: Secondary | ICD-10-CM | POA: Diagnosis not present

## 2021-01-05 DIAGNOSIS — R2689 Other abnormalities of gait and mobility: Secondary | ICD-10-CM | POA: Diagnosis not present

## 2021-01-05 DIAGNOSIS — I639 Cerebral infarction, unspecified: Secondary | ICD-10-CM | POA: Diagnosis not present

## 2021-01-05 DIAGNOSIS — M6281 Muscle weakness (generalized): Secondary | ICD-10-CM | POA: Diagnosis not present

## 2021-01-06 DIAGNOSIS — R278 Other lack of coordination: Secondary | ICD-10-CM | POA: Diagnosis not present

## 2021-01-06 DIAGNOSIS — D649 Anemia, unspecified: Secondary | ICD-10-CM | POA: Diagnosis not present

## 2021-01-06 DIAGNOSIS — E119 Type 2 diabetes mellitus without complications: Secondary | ICD-10-CM | POA: Diagnosis not present

## 2021-01-06 DIAGNOSIS — R2689 Other abnormalities of gait and mobility: Secondary | ICD-10-CM | POA: Diagnosis not present

## 2021-01-06 DIAGNOSIS — I639 Cerebral infarction, unspecified: Secondary | ICD-10-CM | POA: Diagnosis not present

## 2021-01-06 DIAGNOSIS — N184 Chronic kidney disease, stage 4 (severe): Secondary | ICD-10-CM | POA: Diagnosis not present

## 2021-01-06 DIAGNOSIS — M6281 Muscle weakness (generalized): Secondary | ICD-10-CM | POA: Diagnosis not present

## 2021-01-08 ENCOUNTER — Emergency Department (HOSPITAL_COMMUNITY): Payer: Medicare Other

## 2021-01-08 ENCOUNTER — Encounter (HOSPITAL_COMMUNITY): Payer: Self-pay | Admitting: Emergency Medicine

## 2021-01-08 ENCOUNTER — Emergency Department (HOSPITAL_COMMUNITY)
Admission: EM | Admit: 2021-01-08 | Discharge: 2021-01-09 | Disposition: A | Discharge status: Home / Self Care | Payer: Medicare Other | Attending: Emergency Medicine | Admitting: Emergency Medicine

## 2021-01-08 ENCOUNTER — Other Ambulatory Visit: Payer: Self-pay

## 2021-01-08 DIAGNOSIS — I110 Hypertensive heart disease with heart failure: Secondary | ICD-10-CM | POA: Insufficient documentation

## 2021-01-08 DIAGNOSIS — Z7901 Long term (current) use of anticoagulants: Secondary | ICD-10-CM | POA: Insufficient documentation

## 2021-01-08 DIAGNOSIS — E1110 Type 2 diabetes mellitus with ketoacidosis without coma: Secondary | ICD-10-CM | POA: Diagnosis not present

## 2021-01-08 DIAGNOSIS — W19XXXA Unspecified fall, initial encounter: Secondary | ICD-10-CM

## 2021-01-08 DIAGNOSIS — J449 Chronic obstructive pulmonary disease, unspecified: Secondary | ICD-10-CM | POA: Diagnosis not present

## 2021-01-08 DIAGNOSIS — Z794 Long term (current) use of insulin: Secondary | ICD-10-CM | POA: Diagnosis not present

## 2021-01-08 DIAGNOSIS — R519 Headache, unspecified: Secondary | ICD-10-CM | POA: Diagnosis not present

## 2021-01-08 DIAGNOSIS — I672 Cerebral atherosclerosis: Secondary | ICD-10-CM | POA: Diagnosis not present

## 2021-01-08 DIAGNOSIS — R2689 Other abnormalities of gait and mobility: Secondary | ICD-10-CM | POA: Diagnosis not present

## 2021-01-08 DIAGNOSIS — R278 Other lack of coordination: Secondary | ICD-10-CM | POA: Diagnosis not present

## 2021-01-08 DIAGNOSIS — Z743 Need for continuous supervision: Secondary | ICD-10-CM | POA: Diagnosis not present

## 2021-01-08 DIAGNOSIS — I5032 Chronic diastolic (congestive) heart failure: Secondary | ICD-10-CM | POA: Diagnosis not present

## 2021-01-08 DIAGNOSIS — R531 Weakness: Secondary | ICD-10-CM | POA: Diagnosis not present

## 2021-01-08 DIAGNOSIS — M25562 Pain in left knee: Secondary | ICD-10-CM | POA: Insufficient documentation

## 2021-01-08 DIAGNOSIS — S0990XA Unspecified injury of head, initial encounter: Secondary | ICD-10-CM | POA: Diagnosis not present

## 2021-01-08 DIAGNOSIS — I2510 Atherosclerotic heart disease of native coronary artery without angina pectoris: Secondary | ICD-10-CM | POA: Diagnosis not present

## 2021-01-08 DIAGNOSIS — M25462 Effusion, left knee: Secondary | ICD-10-CM | POA: Diagnosis not present

## 2021-01-08 DIAGNOSIS — R5381 Other malaise: Secondary | ICD-10-CM | POA: Diagnosis not present

## 2021-01-08 DIAGNOSIS — W01198A Fall on same level from slipping, tripping and stumbling with subsequent striking against other object, initial encounter: Secondary | ICD-10-CM | POA: Insufficient documentation

## 2021-01-08 DIAGNOSIS — Z79899 Other long term (current) drug therapy: Secondary | ICD-10-CM | POA: Diagnosis not present

## 2021-01-08 DIAGNOSIS — I6782 Cerebral ischemia: Secondary | ICD-10-CM | POA: Diagnosis not present

## 2021-01-08 DIAGNOSIS — M6281 Muscle weakness (generalized): Secondary | ICD-10-CM | POA: Diagnosis not present

## 2021-01-08 DIAGNOSIS — G319 Degenerative disease of nervous system, unspecified: Secondary | ICD-10-CM | POA: Diagnosis not present

## 2021-01-08 DIAGNOSIS — I639 Cerebral infarction, unspecified: Secondary | ICD-10-CM | POA: Diagnosis not present

## 2021-01-08 NOTE — ED Triage Notes (Signed)
Pt from [facility] after sustaining fall and hitting head. No obvious injury noted. Pt denies pain. Alert and oriented at this time.

## 2021-01-08 NOTE — ED Notes (Signed)
Pt unable to sign MSE d/t being in [facility] bed and signature pad on COW not working.

## 2021-01-08 NOTE — ED Notes (Signed)
Pt lying in bed with eyes closed, attempted to ambulate, pt refuses, Dr. Gilford Raid notified.

## 2021-01-08 NOTE — ED Provider Notes (Signed)
[facility] EMERGENCY DEPARTMENT Provider Note   CSN: [id number] Arrival date & time: 01/08/21  2007     History Chief Complaint  Patient presents with  . Fall    Rick Cruz is a 75 y.o. male.  Pt presents to the ED today from [facility].  He sustained a fall and hit his head.  Pt said he also hit his left knee.  His left knee is in a knee immobilizer because of previous surgery.  Pt can ambulate normally only a few steps.        Past Medical History:  Diagnosis Date  . Arthritis   . 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 CHF (congestive heart failure) (HCC)    diastolic   . Chronic pain   . COPD (chronic obstructive pulmonary disease) ([place])   . Depression   . Diabetes mellitus without complication (HCC)    insulin pump  . Hypercholesterolemia   . Hypertension   . On home O2    3 liters  . Oxygen deficiency   . Schizophrenia ([place])   . Sleep apnea    noncompliant with BiPAP  . Sleep apnea   . Tobacco abuse     Patient Active Problem List   Diagnosis Date Noted  . UTI (urinary tract infection) 12/14/2020  . CVA (cerebral vascular accident) ([place]) 10/12/2020  . Ischemic cerebrovascular accident (CVA) ([place]) 10/11/2020  . Hypoalbuminemia 10/11/2020  . Class 1 obesity 10/11/2020  . Iron deficiency anemia 10/11/2020  . Acute metabolic encephalopathy [id number]  . DKA, type 2, not at goal [facility]) 11/03/2019  . Heart failure ([place]) 02/02/2019  . Hyperglycemia   . Altered mental status 11/21/2018  . Diabetic foot ([place]) 07/23/2018  . Accidental fall 06/25/2018  . Supplemental oxygen dependent 06/25/2018  . 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 Neck pain 01/09/2018  . 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 Diabetic neuropathy ([place]) 12/13/2017  . Gastroesophageal reflux disease 12/13/2017  . Insomnia 07/30/2017  . Depression 07/19/2016  . OSA (obstructive sleep apnea)   . Chronic respiratory failure with hypercapnia ([place])   . Undifferentiated schizophrenia  ([place])   . Uncontrolled type 2 diabetes mellitus with complication ([place])   . Anemia of chronic disease   . Paroxysmal atrial fibrillation (HCC)   . Hyponatremia 06/12/2016  . Aspiration pneumonia ([place]) 08/16/2015  . Seborrheic keratosis 08/04/2015  . Pure hypercholesterolemia 07/05/2015  . Obesity hypoventilation syndrome ([place]) 04/05/2015  . CSA (central sleep apnea) 04/05/2015  . Non compliance with medical treatment 04/05/2015  . Type 2 diabetes mellitus with hyperglycemia ([place]) 02/28/2015  . Acute respiratory failure with hypoxia and hypercarbia (HCC)   . Chronic diastolic CHF (congestive heart failure) ([place]) 08/06/2014  . Recurrent falls 05/08/2014  . Ankle fracture, right 05/08/2014  . Ankle fracture, left 05/08/2014  . C. difficile colitis 01/04/2014  . Delirium 01/02/2014  . Acute encephalopathy 01/02/2014  . Morbid obesity ([place]) 06/17/2013  . Erosive esophagitis 04/22/2013  . Chronic respiratory failure 02 dep with hypercarbia 04/22/2013  . Type 2 diabetes mellitus without complication, with long-term current use of insulin ([place]) 04/21/2013  . 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 CAD (coronary artery disease) 04/18/2013  . DYSLIPIDEMIA 08/25/2008  . Obstructive sleep apnea 08/25/2008  . Essential hypertension 08/25/2008  . Dyslipidemia 08/25/2008    Past Surgical History:  Procedure Laterality Date  . COLON SURGERY  March 2010   secondary to large colon polyp, final path per discharge summary notes  tubulovillous adenoma.   . COLONOSCOPY  July 2011   Dr. Benson Norway: multiple polyps, internal and external hemorrhoids, diverticulosis. tubular adenoma  . ESOPHAGOGASTRODUODENOSCOPY (EGD) WITH PROPOFOL N/A 04/20/2013   Procedure: ESOPHAGOGASTRODUODENOSCOPY (EGD) WITH PROPOFOL;  Surgeon: Daneil Dolin, MD;  Location: [facility];  Service: Endoscopy;  Laterality: N/A;  . KNEE SURGERY     X2       Family History  Problem Relation Age of Onset  . Thyroid disease Mother   .  Parkinson's disease Father   . Parkinson's disease Other   . Colon cancer Neg Hx     Social History   Tobacco Use  . Smoking status: Former Smoker    Packs/day: 1.00    Years: 50.00    Pack years: 50.00    Types: Cigarettes    Quit date: 09/16/2013    Years since quitting: 7.3  . Smokeless tobacco: Never Used  Substance Use Topics  . Alcohol use: No    Comment: history of ETOH abuse in remote past, none in at least 10 years  . Drug use: No    Home Medications Prior to Admission medications   Medication Sig Start Date End Date Taking? Authorizing Provider  acetaminophen (TYLENOL) 325 MG tablet Take 2 tablets (650 mg total) by mouth every 6 (six) hours as needed for mild pain, fever or headache (or Fever >/= 101). 02/22/20   Roxan Hockey, MD  albuterol (PROVENTIL) (2.5 MG/3ML) 0.083% nebulizer solution Take 3 mLs (2.5 mg total) by nebulization every 6 (six) hours as needed for wheezing or shortness of breath. 02/22/20   Roxan Hockey, MD  apixaban (ELIQUIS) 5 MG TABS tablet Take 1 tablet (5 mg total) by mouth 2 (two) times daily. 02/22/20   Roxan Hockey, MD  bacitracin-polymyxin b (POLYSPORIN) ointment Apply 1 application topically 2 (two) times daily.    [provider]  carboxymethylcellulose (REFRESH PLUS) 0.5 % SOLN Apply 1 drop to eye 3 (three) times daily as needed.    [provider]  cholecalciferol (VITAMIN D) 1000 UNITS tablet Take 1,000 Units by mouth 2 (two) times daily.    [provider]  docusate sodium (COLACE) 100 MG capsule Take 100 mg by mouth 2 (two) times daily.    [provider]  DULoxetine (CYMBALTA) 30 MG capsule Take 1 capsule (30 mg total) by mouth every morning. 02/22/20   Roxan Hockey, MD  Fluticasone-Umeclidin-Vilant (TRELEGY ELLIPTA) 100-62.5-25 MCG/INH AEPB Inhale 1 puff into the lungs daily. 02/22/20   Roxan Hockey, MD  gabapentin (NEURONTIN) 300 MG capsule Take 1 capsule (300 mg total) by mouth at bedtime.  10/18/20   Geradine Girt, DO  hydrOXYzine (ATARAX/VISTARIL) 25 MG tablet Take 1 tablet (25 mg total) by mouth every 12 (twelve) hours as needed for anxiety or itching (Anxiety and insomnia or restlessness). 02/22/20   Roxan Hockey, MD  insulin aspart (NOVOLOG) 100 UNIT/ML injection Inject 0-15 Units into the skin 3 (three) times daily with meals. 10/18/20   Geradine Girt, DO  insulin glargine (LANTUS SOLOSTAR) 100 UNIT/ML Solostar Pen Inject 20 Units into the skin at bedtime. 10/18/20   Eulogio Bear U, DO  melatonin 3 MG TABS tablet Take 6 mg by mouth at bedtime.    [provider]  metoprolol tartrate (LOPRESSOR) 25 MG tablet Take 1 tablet (25 mg total) by mouth 2 (two) times daily. 10/18/20   Geradine Girt, DO  Multiple Vitamins-Minerals (PRESERVISION AREDS 2+MULTI VIT PO) Take 2 tablets by mouth in  the morning and at bedtime.    [provider]  omeprazole (PRILOSEC) 20 MG capsule Take 20 mg by mouth 2 (two) times daily before a meal.    [provider]  OXYGEN Inhale 3 L into the lungs continuous.    [provider]  QUEtiapine (SEROQUEL) 25 MG tablet Take 1 tablet (25 mg total) by mouth 2 (two) times daily. 12/18/20   Barton Dubois, MD  rosuvastatin (CRESTOR) 10 MG tablet Take 10 mg by mouth daily.    [provider]  tiotropium (SPIRIVA) 18 MCG inhalation capsule Place 1 capsule (18 mcg total) into inhaler and inhale daily. 02/22/20   Roxan Hockey, MD  triamcinolone cream (KENALOG) 0.1 % Apply 1 application topically 2 (two) times daily. 02/11/20   Avegno, Darrelyn Hillock, FNP  TRULICITY 3 0000000 SOPN Inject 3 mg into the skin every Friday. 06/07/20   [provider]    Allergies    Phenergan [promethazine hcl], Haldol [haloperidol lactate], and Metformin and related  Review of Systems   Review of Systems  Musculoskeletal:       Left knee pain  All other systems reviewed and are negative.   Physical Exam Updated Vital Signs BP  137/89   Pulse 80   Temp 97.9 F (36.6 C) (Oral)   Resp 16   Ht 6' (1.829 m)   Wt 110 kg   SpO2 96%   BMI 32.89 kg/m   Physical Exam Vitals and nursing note reviewed.  Constitutional:      Appearance: Normal appearance.  HENT:     Head: Normocephalic and atraumatic.     Right Ear: External ear normal.     Left Ear: External ear normal.     Nose: Nose normal.     Mouth/Throat:     Mouth: Mucous membranes are dry.  Eyes:     Extraocular Movements: Extraocular movements intact.     Conjunctiva/sclera: Conjunctivae normal.     Pupils: Pupils are equal, round, and reactive to light.  Cardiovascular:     Rate and Rhythm: Normal rate and regular rhythm.     Pulses: Normal pulses.     Heart sounds: Normal heart sounds.  Pulmonary:     Effort: Pulmonary effort is normal.     Breath sounds: Normal breath sounds.  Abdominal:     General: Abdomen is flat. Bowel sounds are normal.     Palpations: Abdomen is soft.  Musculoskeletal:     Cervical back: Normal range of motion and neck supple.     Comments: Left knee in knee immobilizer  Skin:    General: Skin is warm.     Capillary Refill: Capillary refill takes less than 2 seconds.  Neurological:     Mental Status: He is alert. Mental status is at baseline.     ED Results / Procedures / Treatments   Labs (all labs ordered are listed, but only abnormal results are displayed) Labs Reviewed - No data to display  EKG None  Radiology CT Head Wo Contrast  Result Date: 01/08/2021 CLINICAL DATA:  Patient fell, striking the head. EXAM: CT HEAD WITHOUT CONTRAST TECHNIQUE: Contiguous axial images were obtained from the base of the skull through the vertex without intravenous contrast. COMPARISON:  12/13/2020 FINDINGS: Brain: No evidence of acute infarction, hemorrhage, hydrocephalus, extra-axial collection or mass lesion/mass effect. Diffuse cerebral atrophy. Mild ventricular dilatation consistent with central atrophy. Low-attenuation  changes in the deep white matter consistent small vessel ischemia. Vascular: Moderate intracranial arterial  vascular calcifications. Skull: Calvarium appears intact. Sinuses/Orbits: Paranasal sinuses and mastoid air cells are clear. Other: None. IMPRESSION: 1. No acute intracranial abnormalities. 2. Chronic atrophy and small vessel ischemic changes. Electronically Signed   By: Lucienne Capers M.D.   On: 01/08/2021 21:03   DG Knee Complete 4 Views Left  Result Date: 01/08/2021 CLINICAL DATA:  Fall with knee pain EXAM: LEFT KNEE - COMPLETE 4+ VIEW COMPARISON:  November 10, 2020 radiograph and CT November 11, 2020. FINDINGS: No evidence of acute osseous fracture. Similar findings of partial quadriceps tendon tear with soft tissue edema in the region absent appearance of the normal tendon morphology as well as small calcifications in the distal quadriceps tendon and mild patellar [place]. Small joint effusion. Meniscal chondrocalcinosis. IMPRESSION: 1. No evidence of acute osseous fracture. 2. Similar findings of partial quadriceps tendon tear. Electronically Signed   By: Dahlia Bailiff MD   On: 01/08/2021 21:14    Procedures Procedures   Medications Ordered in ED Medications - No data to display  ED Course  I have reviewed the triage vital signs and the nursing notes.  Pertinent labs & imaging results that were available during my care of the patient were reviewed by me and considered in my medical decision making (see chart for details).    MDM Rules/Calculators/A&P                          Pt refused to ambulate.  He became very agitated when this was suggested.  I think this is behavioral, not from an undiagnosed fx.  Pt is going back to a SNF where he gets Pt.  He is stable for d/c.  Return if worse.  Final Clinical Impression(s) / ED Diagnoses Final diagnoses:  Fall, initial encounter    Rx / DC Orders ED Discharge Orders    None       Isla Pence, MD 01/08/21 2230

## 2021-01-09 DIAGNOSIS — Z7401 Bed confinement status: Secondary | ICD-10-CM | POA: Diagnosis not present

## 2021-01-09 DIAGNOSIS — I639 Cerebral infarction, unspecified: Secondary | ICD-10-CM | POA: Diagnosis not present

## 2021-01-09 DIAGNOSIS — R2689 Other abnormalities of gait and mobility: Secondary | ICD-10-CM | POA: Diagnosis not present

## 2021-01-09 DIAGNOSIS — Z743 Need for continuous supervision: Secondary | ICD-10-CM | POA: Diagnosis not present

## 2021-01-09 DIAGNOSIS — R5381 Other malaise: Secondary | ICD-10-CM | POA: Diagnosis not present

## 2021-01-09 DIAGNOSIS — M6281 Muscle weakness (generalized): Secondary | ICD-10-CM | POA: Diagnosis not present

## 2021-01-09 DIAGNOSIS — R278 Other lack of coordination: Secondary | ICD-10-CM | POA: Diagnosis not present

## 2021-01-09 DIAGNOSIS — R69 Illness, unspecified: Secondary | ICD-10-CM | POA: Diagnosis not present

## 2021-01-10 DIAGNOSIS — M6281 Muscle weakness (generalized): Secondary | ICD-10-CM | POA: Diagnosis not present

## 2021-01-10 DIAGNOSIS — R278 Other lack of coordination: Secondary | ICD-10-CM | POA: Diagnosis not present

## 2021-01-10 DIAGNOSIS — E119 Type 2 diabetes mellitus without complications: Secondary | ICD-10-CM | POA: Diagnosis not present

## 2021-01-10 DIAGNOSIS — I639 Cerebral infarction, unspecified: Secondary | ICD-10-CM | POA: Diagnosis not present

## 2021-01-10 DIAGNOSIS — R2689 Other abnormalities of gait and mobility: Secondary | ICD-10-CM | POA: Diagnosis not present

## 2021-01-11 DIAGNOSIS — R278 Other lack of coordination: Secondary | ICD-10-CM | POA: Diagnosis not present

## 2021-01-11 DIAGNOSIS — I639 Cerebral infarction, unspecified: Secondary | ICD-10-CM | POA: Diagnosis not present

## 2021-01-11 DIAGNOSIS — R2689 Other abnormalities of gait and mobility: Secondary | ICD-10-CM | POA: Diagnosis not present

## 2021-01-11 DIAGNOSIS — M6281 Muscle weakness (generalized): Secondary | ICD-10-CM | POA: Diagnosis not present

## 2021-01-11 DIAGNOSIS — M25562 Pain in left knee: Secondary | ICD-10-CM | POA: Diagnosis not present

## 2021-01-12 DIAGNOSIS — R278 Other lack of coordination: Secondary | ICD-10-CM | POA: Diagnosis not present

## 2021-01-12 DIAGNOSIS — M6281 Muscle weakness (generalized): Secondary | ICD-10-CM | POA: Diagnosis not present

## 2021-01-12 DIAGNOSIS — I639 Cerebral infarction, unspecified: Secondary | ICD-10-CM | POA: Diagnosis not present

## 2021-01-12 DIAGNOSIS — E1169 Type 2 diabetes mellitus with other specified complication: Secondary | ICD-10-CM | POA: Diagnosis not present

## 2021-01-12 DIAGNOSIS — E785 Hyperlipidemia, unspecified: Secondary | ICD-10-CM | POA: Diagnosis not present

## 2021-01-12 DIAGNOSIS — I10 Essential (primary) hypertension: Secondary | ICD-10-CM | POA: Diagnosis not present

## 2021-01-12 DIAGNOSIS — K219 Gastro-esophageal reflux disease without esophagitis: Secondary | ICD-10-CM | POA: Diagnosis not present

## 2021-01-12 DIAGNOSIS — Z79899 Other long term (current) drug therapy: Secondary | ICD-10-CM | POA: Diagnosis not present

## 2021-01-12 DIAGNOSIS — R2689 Other abnormalities of gait and mobility: Secondary | ICD-10-CM | POA: Diagnosis not present

## 2021-01-13 DIAGNOSIS — R2689 Other abnormalities of gait and mobility: Secondary | ICD-10-CM | POA: Diagnosis not present

## 2021-01-13 DIAGNOSIS — R278 Other lack of coordination: Secondary | ICD-10-CM | POA: Diagnosis not present

## 2021-01-13 DIAGNOSIS — M6281 Muscle weakness (generalized): Secondary | ICD-10-CM | POA: Diagnosis not present

## 2021-01-13 DIAGNOSIS — I639 Cerebral infarction, unspecified: Secondary | ICD-10-CM | POA: Diagnosis not present

## 2021-01-14 DIAGNOSIS — R278 Other lack of coordination: Secondary | ICD-10-CM | POA: Diagnosis not present

## 2021-01-14 DIAGNOSIS — R2689 Other abnormalities of gait and mobility: Secondary | ICD-10-CM | POA: Diagnosis not present

## 2021-01-14 DIAGNOSIS — I639 Cerebral infarction, unspecified: Secondary | ICD-10-CM | POA: Diagnosis not present

## 2021-01-14 DIAGNOSIS — M6281 Muscle weakness (generalized): Secondary | ICD-10-CM | POA: Diagnosis not present

## 2021-01-16 ENCOUNTER — Other Ambulatory Visit: Payer: Self-pay

## 2021-01-16 ENCOUNTER — Encounter: Payer: Self-pay | Admitting: Urology

## 2021-01-16 ENCOUNTER — Telehealth: Payer: Self-pay | Admitting: Internal Medicine

## 2021-01-16 ENCOUNTER — Ambulatory Visit (INDEPENDENT_AMBULATORY_CARE_PROVIDER_SITE_OTHER): Payer: Medicare Other | Admitting: Urology

## 2021-01-16 ENCOUNTER — Encounter: Payer: Self-pay | Admitting: Internal Medicine

## 2021-01-16 ENCOUNTER — Telehealth: Payer: Self-pay

## 2021-01-16 ENCOUNTER — Ambulatory Visit (INDEPENDENT_AMBULATORY_CARE_PROVIDER_SITE_OTHER): Payer: Medicare Other | Admitting: Internal Medicine

## 2021-01-16 VITALS — BP 94/68 | HR 90 | Temp 98.5°F | Ht 72.0 in | Wt 243.0 lb

## 2021-01-16 DIAGNOSIS — J9611 Chronic respiratory failure with hypoxia: Secondary | ICD-10-CM | POA: Diagnosis not present

## 2021-01-16 DIAGNOSIS — R3981 Functional urinary incontinence: Secondary | ICD-10-CM

## 2021-01-16 DIAGNOSIS — R339 Retention of urine, unspecified: Secondary | ICD-10-CM

## 2021-01-16 DIAGNOSIS — R0600 Dyspnea, unspecified: Secondary | ICD-10-CM | POA: Diagnosis not present

## 2021-01-16 DIAGNOSIS — M6281 Muscle weakness (generalized): Secondary | ICD-10-CM | POA: Diagnosis not present

## 2021-01-16 DIAGNOSIS — R278 Other lack of coordination: Secondary | ICD-10-CM | POA: Diagnosis not present

## 2021-01-16 DIAGNOSIS — R2689 Other abnormalities of gait and mobility: Secondary | ICD-10-CM | POA: Diagnosis not present

## 2021-01-16 DIAGNOSIS — J9612 Chronic respiratory failure with hypercapnia: Secondary | ICD-10-CM

## 2021-01-16 DIAGNOSIS — R0609 Other forms of dyspnea: Secondary | ICD-10-CM

## 2021-01-16 DIAGNOSIS — I639 Cerebral infarction, unspecified: Secondary | ICD-10-CM | POA: Diagnosis not present

## 2021-01-16 LAB — BLADDER SCAN AMB NON-IMAGING

## 2021-01-16 NOTE — Assessment & Plan Note (Addendum)
Quit smoking 2014  - off acei effective 05/07/13 > improved 06/17/2013  - 06/17/2013  FEV1  1.91 (52%)  Ratio 76 no change p B2,  DLCO 54% corrects to 100% with mild concavity to f/v curve   Says doing better on trelegy so may have AB component but not classic copd criteria  and clearly does not need to be using two LAMA's here > d/c spiriva.   >>  The proper method of use, as well as anticipated side effects, of an elipta inhaler were discussed and demonstrated to the patient using teach back method   Since really more limited by orthopedic issues than copd and not having aecopd at this point we can see him prn           Each maintenance medication was reviewed in detail including emphasizing most importantly the difference between maintenance and prns and under what circumstances the prns are to be triggered using an action plan format where appropriate.  Total time for H and P, chart review, counseling, reviewing elipta device(s) and generating customized AVS unique to this office visit / same day charting = 55 min

## 2021-01-16 NOTE — Patient Instructions (Signed)
Stop spiriva (redundant as this medication is in the trelegy)  Make sure you check your oxygen saturation  at your highest level of activity  to be sure it stays over 90% and adjust  02 flow upward to maintain this level if needed    Pulmonary follow up is as needed

## 2021-01-16 NOTE — Patient Instructions (Signed)
Overactive Bladder, Adult  Overactive bladder is a condition in which a person has a sudden and frequent need to urinate. A person might also leak urine if he or she cannot get to the bathroom fast enough (urinary incontinence). Sometimes, symptoms can interfere with work or social activities. What are the causes? Overactive bladder is associated with poor nerve signals between your bladder and your brain. Your bladder may get the signal to empty before it is full. You may also have very sensitive muscles that make your bladder squeeze too soon. This condition may also be caused by other factors, such as:  Medical conditions: ? Urinary tract infection. ? Infection of nearby tissues. ? Prostate enlargement. ? Bladder stones, inflammation, or tumors. ? Diabetes. ? Muscle or nerve weakness, especially from these conditions:  A spinal cord injury.  Stroke.  Multiple sclerosis.  Parkinson's disease.  Other causes: ? Surgery on the uterus or urethra. ? Drinking too much caffeine or alcohol. ? Certain medicines, especially those that eliminate extra fluid in the body (diuretics). ? Constipation. What increases the risk? You may be at greater risk for overactive bladder if you:  Are an older adult.  Smoke.  Are going through menopause.  Have prostate problems.  Have a neurological disease, such as stroke, dementia, Parkinson's disease, or multiple sclerosis (MS).  Eat or drink alcohol, spicy food, caffeine, and other things that irritate the bladder.  Are overweight or obese. What are the signs or symptoms? Symptoms of this condition include a sudden, strong urge to urinate. Other symptoms include:  Leaking urine.  Urinating 8 or more times a day.  Waking up to urinate 2 or more times overnight. How is this diagnosed? This condition may be diagnosed based on:  Your symptoms and medical history.  A physical exam.  Blood or urine tests to check for possible causes,  such as infection. You may also need to see a health care provider who specializes in urinary tract problems. This is called a urologist. How is this treated? Treatment for overactive bladder depends on the cause of your condition and whether it is mild or severe. Treatment may include:  Bladder training, such as: ? Learning to control the urge to urinate by following a schedule to urinate at regular intervals. ? Doing Kegel exercises to strengthen the pelvic floor muscles that support your bladder.  Special devices, such as: ? Biofeedback. This uses sensors to help you become aware of your body's signals. ? Electrical stimulation. This uses electrodes placed inside the body (implanted) or outside the body. These electrodes send gentle pulses of electricity to strengthen the nerves or muscles that control the bladder. ? Women may use a plastic device, called a pessary, that fits into the vagina and supports the bladder.  Medicines, such as: ? Antibiotics to treat bladder infection. ? Antispasmodics to stop the bladder from releasing urine at the wrong time. ? Tricyclic antidepressants to relax bladder muscles. ? Injections of botulinum toxin type A directly into the bladder tissue to relax bladder muscles.  Surgery, such as: ? A device may be implanted to help manage the nerve signals that control urination. ? An electrode may be implanted to stimulate electrical signals in the bladder. ? A procedure may be done to change the shape of the bladder. This is done only in very severe cases. Follow these instructions at home: Eating and drinking  Make diet or lifestyle changes recommended by your health care provider. These may include: ? Drinking fluids   throughout the day and not only with meals. ? Cutting down on caffeine or alcohol. ? Eating a healthy and balanced diet to prevent constipation. This may include:  Choosing foods that are high in fiber, such as beans, whole grains, and  fresh fruits and vegetables.  Limiting foods that are high in fat and processed sugars, such as fried and sweet foods.   Lifestyle  Lose weight if needed.  Do not use any products that contain nicotine or tobacco. These include cigarettes, chewing tobacco, and vaping devices, such as e-cigarettes. If you need help quitting, ask your health care provider.   General instructions  Take over-the-counter and prescription medicines only as told by your health care provider.  If you were prescribed an antibiotic medicine, take it as told by your health care provider. Do not stop taking the antibiotic even if you start to feel better.  Use any implants or pessary as told by your health care provider.  If needed, wear pads to absorb urine leakage.  Keep a log to track how much and when you drink, and when you need to urinate. This will help your health care provider monitor your condition.  Keep all follow-up visits. This is important. Contact a health care provider if:  You have a fever or chills.  Your symptoms do not get better with treatment.  Your pain and discomfort get worse.  You have more frequent urges to urinate. Get help right away if:  You are not able to control your bladder. Summary  Overactive bladder refers to a condition in which a person has a sudden and frequent need to urinate.  Several conditions may lead to an overactive bladder.  Treatment for overactive bladder depends on the cause and severity of your condition.  Making lifestyle changes, doing Kegel exercises, keeping a log, and taking medicines can help with this condition. This information is not intended to replace advice given to you by your health care provider. Make sure you discuss any questions you have with your health care provider. Document Revised: 05/23/2020 Document Reviewed: 05/23/2020 Elsevier Patient Education  2021 [employer]  

## 2021-01-16 NOTE — Telephone Encounter (Signed)
Spoke with Spouse and healthcare POA  She states wanting to know what rec from today's visit were She normally comes with pt to each visit and could not today bc she was out of town  I went over AVS  Nothing further needed

## 2021-01-16 NOTE — Progress Notes (Signed)
01/16/2021 3:00 PM   Rick Cruz 04/29/1946 [id number]  Referring provider: No referring provider defined for this encounter.  CC: BPH, LUTS   HPI:  New pt for -   BPH and luts - he reports he typically voids with a good stream. He says he can get to the toilet in time but I wonder if he's had some accidents and voids in the diaper. He is the only one contributing to the history. A CT angio 02/22 showed a normal prostate with mild BPH ~ 30 g . He denies gross hematuria or dysuria. AUASS = 15, mostly satisfied.   NG risk includes CVA, muscle weakness, memory issues and immobility. Voids in a diaper. He is on Eliquis.   He could not leave a specimen today. He did not void prior and bladder scan 161 ml.    PMH: Past Medical History:  Diagnosis Date  . Arthritis   . 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 CHF (congestive heart failure) (HCC)    diastolic   . Chronic pain   . COPD (chronic obstructive pulmonary disease) ([place])   . Depression   . Diabetes mellitus without complication (HCC)    insulin pump  . Hypercholesterolemia   . Hypertension   . On home O2    3 liters  . Oxygen deficiency   . Schizophrenia ([place])   . Sleep apnea    noncompliant with BiPAP  . Sleep apnea   . Tobacco abuse     Surgical History: Past Surgical History:  Procedure Laterality Date  . COLON SURGERY  March 2010   secondary to large colon polyp, final path per discharge summary notes tubulovillous adenoma.   . COLONOSCOPY  July 2011   Dr. Benson [place]: multiple polyps, internal and external hemorrhoids, diverticulosis. tubular adenoma  . ESOPHAGOGASTRODUODENOSCOPY (EGD) WITH PROPOFOL N/A 04/20/2013   Procedure: ESOPHAGOGASTRODUODENOSCOPY (EGD) WITH PROPOFOL;  Surgeon: Daneil Dolin, MD;  Location: [facility];  Service: Endoscopy;  Laterality: N/A;  . KNEE SURGERY     X2    Home Medications:  Allergies as of 01/16/2021      Reactions   Phenergan [promethazine Hcl] Other (See Comments)    Becomes very confused and aggressive   Haldol [haloperidol Lactate] Other (See Comments)   Shaking    Metformin And Related Diarrhea      Medication List       Accurate as of Jan 16, 2021  3:00 PM. If you have any questions, ask your nurse or doctor.        acetaminophen 325 MG tablet Commonly known as: TYLENOL Take 2 tablets (650 mg total) by mouth every 6 (six) hours as needed for mild pain, fever or headache (or Fever >/= 101).   albuterol (2.5 MG/3ML) 0.083% nebulizer solution Commonly known as: PROVENTIL Take 3 mLs (2.5 mg total) by nebulization every 6 (six) hours as needed for wheezing or shortness of breath.   apixaban 5 MG Tabs tablet Commonly known as: ELIQUIS Take 1 tablet (5 mg total) by mouth 2 (two) times daily.   aspirin EC 81 MG tablet Take 81 mg by mouth daily. Swallow whole.   bacitracin-polymyxin b ointment Commonly known as: POLYSPORIN Apply 1 application topically 2 (two) times daily.   carboxymethylcellulose 0.5 % Soln Commonly known as: REFRESH PLUS Apply 1 drop to eye 3 (three) times daily as needed.   cholecalciferol 1000 units tablet Commonly known as: VITAMIN D Take 1,000 Units by mouth  2 (two) times daily.   docusate sodium 100 MG capsule Commonly known as: COLACE Take 100 mg by mouth 2 (two) times daily.   DULoxetine 30 MG capsule Commonly known as: CYMBALTA Take 1 capsule (30 mg total) by mouth every morning.   gabapentin 300 MG capsule Commonly known as: NEURONTIN Take 1 capsule (300 mg total) by mouth at bedtime.   hydrOXYzine 25 MG tablet Commonly known as: ATARAX/VISTARIL Take 1 tablet (25 mg total) by mouth every 12 (twelve) hours as needed for anxiety or itching (Anxiety and insomnia or restlessness).   insulin aspart 100 UNIT/ML injection Commonly known as: novoLOG Inject 0-15 Units into the skin 3 (three) times daily with meals.   Lantus SoloStar 100 UNIT/ML Solostar Pen Generic drug: insulin glargine Inject 20 Units  into the skin at bedtime.   melatonin 3 MG Tabs tablet Take 6 mg by mouth at bedtime.   metoprolol tartrate 25 MG tablet Commonly known as: LOPRESSOR Take 1 tablet (25 mg total) by mouth 2 (two) times daily.   omeprazole 20 MG capsule Commonly known as: PRILOSEC Take 20 mg by mouth 2 (two) times daily before a meal.   OXYGEN Inhale 3 L into the lungs continuous.   PRESERVISION AREDS 2+MULTI VIT PO Take 2 tablets by mouth in the morning and at bedtime.   QUEtiapine 25 MG tablet Commonly known as: SEROQUEL Take 1 tablet (25 mg total) by mouth 2 (two) times daily.   rosuvastatin 10 MG tablet Commonly known as: CRESTOR Take 10 mg by mouth daily.   tiotropium 18 MCG inhalation capsule Commonly known as: SPIRIVA Place 1 capsule (18 mcg total) into inhaler and inhale daily.   Trelegy Ellipta 100-62.5-25 MCG/INH Aepb Generic drug: Fluticasone-Umeclidin-Vilant Inhale 1 puff into the lungs daily.   triamcinolone cream 0.1 % Commonly known as: KENALOG Apply 1 application topically 2 (two) times daily.   Trulicity 3 0000000 Sopn Generic drug: Dulaglutide Inject 3 mg into the skin every Friday.       Allergies:  Allergies  Allergen Reactions  . Phenergan [Promethazine Hcl] Other (See Comments)    Becomes very confused and aggressive  . Haldol [Haloperidol Lactate] Other (See Comments)    Shaking   . Metformin And Related Diarrhea    Family History: Family History  Problem Relation Age of Onset  . Thyroid disease Mother   . Parkinson's disease Father   . Parkinson's disease Other   . Colon cancer Neg Hx     Social History:  reports that he quit smoking about 7 years ago. His smoking use included cigarettes. He has a 50.00 pack-year smoking history. He has never used smokeless tobacco. He reports that he does not drink alcohol and does not use drugs.   Physical Exam: There were no vitals taken for this visit.  Constitutional:  Alert and oriented, No acute  distress. HEENT: [place] AT, moist mucus membranes.  Trachea midline, no masses. Cardiovascular: No clubbing, cyanosis, or edema. Respiratory: Normal respiratory effort, no increased work of breathing. GI: Abdomen is soft, nontender, nondistended, no abdominal masses GU: No CVA tenderness Lymph: No cervical or inguinal lymphadenopathy. Skin: No rashes, bruises or suspicious lesions. Neurologic: Grossly intact, no focal deficits, moving all 4 extremities. Psychiatric: Normal mood and affect. GU: bladder not distended on exam. Penis uncircumcised (or buried) and without phimosis, glans, meatus normal, scrotum normal. Wearing a diaper. Dry.   Laboratory Data: Lab Results  Component Value Date   WBC 8.3 12/16/2020   HGB  11.1 (L) 12/16/2020   HCT 38.1 (L) 12/16/2020   MCV 84.3 12/16/2020   PLT 269 12/16/2020    Lab Results  Component Value Date   CREATININE 0.97 12/16/2020    No results found for: PSA  No results found for: TESTOSTERONE  Lab Results  Component Value Date   HGBA1C 8.6 (H) 12/13/2020    Urinalysis    Component Value Date/Time   COLORURINE YELLOW 12/13/2020 1004   APPEARANCEUR TURBID (A) 12/13/2020 1004   LABSPEC 1.016 12/13/2020 1004   PHURINE 6.0 12/13/2020 1004   GLUCOSEU 150 (A) 12/13/2020 1004   HGBUR MODERATE (A) 12/13/2020 1004   BILIRUBINUR NEGATIVE 12/13/2020 1004   KETONESUR NEGATIVE 12/13/2020 1004   PROTEINUR 100 (A) 12/13/2020 1004   UROBILINOGEN 1.0 12/05/2014 1519   NITRITE NEGATIVE 12/13/2020 1004   LEUKOCYTESUR MODERATE (A) 12/13/2020 1004    Lab Results  Component Value Date   BACTERIA NONE SEEN 12/13/2020    Pertinent Imaging: CT angio - independently reviewed images    Assessment & Plan:    1. Incontinence - likely functional and also could be some OAB given dementia and CVA issues. He is not in retention here. Alpha blockers and OAB meds have a lot of side effects - dizziness, constipation, confusion, etc. I would recommend he  continue to drink of plenty of water, void in the diaper and change diaper PRN. I would suspect him to have some bacteruria on a clean catch and would not recommend abx unless localizing symptoms (bladder pain, dysuria) or fever, confusion when no other source is suspected. We will be happy to see back as needed.   Addendum: today I received this message from Valentina Lucks, RN: Wife called and was concerned that pt keeps having utis and said they are not watching him at facility for symptoms as they should and he is unable to vocalize for himself. She is concerned he could still have an infection. Can we give order for them to obtain specimen at facility?   I thought it would be best to give her a quick call. She is concerned about a "UTI" because of his overall decline and occasional delirium. I discussed these are legitimate concerns and not likely from a "UTI" although they certainly can be. I discussed the usual nature, r/b/a to abx and how abx dont prevent symptomatic infections. We discussed above how I might expect Dominica Severin to have some bacteruria and urine odor but abx are reserved for true infection - localizing symptoms (bladder pain, dysuria) or fever, confusion when no other source is suspected - per ID guidelines. Many pts like this will simply respond to fluids and more urine production both in the long term and acute phase. She voiced understanding.    No follow-ups on file.  Festus Aloe, MD

## 2021-01-16 NOTE — Progress Notes (Signed)
Rick Cruz, male    DOB: Apr 11, 1946    MRN: [id number]   Brief patient profile:  21 yowm quit smoking 2014  Now NH resident  Admit date: 12/13/2020 Discharge date: 12/19/2020  Recommendations for Outpatient Follow-up:  Repeat base metabolic panel to follow across renal function Continue constant reorientation Outpatient follow-up with PCP in the next 2 weeks Continue to follow CBGs with further adjustment to hypoglycemia regimen as required. Continue low-sodium (less than 2.5 gram daily) diet and daily weight.  Discharge Diagnoses:  Principal Problem:   Acute metabolic encephalopathy   Essential hypertension   Paranoid schizophrenia ([place])   CAD (coronary artery disease)   Chronic diastolic CHF (congestive heart failure) (HCC)   Uncontrolled type 2 diabetes mellitus with complication (HCC)   Paroxysmal atrial fibrillation (HCC)   Blind right eye   Chronic ischemic heart disease   Supplemental oxygen dependent   Ischemic cerebrovascular accident (CVA) ([place])   UTI (urinary tract infection)   History of present illness:  As per H&P written by Dr. 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 on home oxygen, sleep apnea noncompliant with BiPAP, history of tobacco abuse, type II DM, hyperlipidemia, hypertension, history of unspecified schizophreniawho presents from [facility] due to concerns about worsening confusion and combativeness and bizarre behavior, as per staff from SNF patient apparently was eatinghis own stool -Patient tested positive for Covid more than 4 weeks ago -CTheadwithout acute findings -CMP remarkable for glucose of 270 creatinine is normal at 0.8 -CBC with a white count of 12.5 -Hemoglobin is 12.3 which is close to patient's baseline -UA suggestive of UTI,,  UA also has yeast -EDP requested hospitalization for presumed UTI with altered mentation -Patient is a very poor historian but apparently no vomiting or diarrhea, and no fevers  Hospital Course:  Acute metabolic encephalopathy -In the setting of presumed UTI -Mentation has improved and back to baseline at time of discharge. -Continue constant reorientation and minimizing the use of medications that can alter mentation. -Evaluation by physical therapy requested and recommendations for skilled nursing facility received. -Patient discharged [facility] a [facility] nursing facility on 12/18/2020.  UTI -Cultures demonstrating multiple microorganisms without specific isolated specie; positive for yeast. -Empirically treated with Rocephin and Diflucan -Patient has completed antibiotic therapy at time of discharge.  Essential hypertension -Stable overall -Continue current antihypertensive agents. -Low-sodium diet advised.  Paranoid schizophrenia ([place]) -Continue adjusted home psychotropic medications regimen. -No vegetations, combativeness of the use of any active restraints of the last 48-526 hours prior to discharge.  CAD (coronary artery disease) -No chest pain or shortness of breath -Continue risk factor modification. -No acute ischemic abnormalities appreciated on EKG.  Chronic diastolic CHF (congestive heart failure) (HCC) -Resume home medications to continue providing volume stability.   -Encouraged to follow low-sodium diet and to check his weight on daily basis.  Uncontrolled type 2 diabetes mellitus with complication (HCC) -Continue to follow CBGs and adjust hypoglycemic regimen as needed -Hypoglycemic regimen (using Lantus and Trulicity). -Advised to follow modified carbohydrate diet. Paroxysmal atrial fibrillation (HCC) -Continue Eliquis for secondary prevention -Rate controlled at this time.  Class II obesity, chronic hypoxic and hypercapnic respiratory failure  and obstructive sleep apnea -Continue home oxygen supplementation -Patient noncompliant with CPAP as an outpatient. -Continue as needed bronchodilators. -Body mass index is 32.14 kg/m.  Ischemic cerebrovascular accident (CVA) ([place]) -Continue Eliquis for  secondary prevention -CT scan of the head on admission not demonstrating acute intracranial abnormalities. -Continue risk factor modification.        History of Present Illness  01/16/2021  Pulmonary/ 1st office eval/ Rick Cruz / [facility] maint on trelegy and spiriva Chief Complaint  Patient presents with  . Pulmonary Consult    Referred by Dr Arlana Hove. Pt c/o DOE for the past 3 years. He states that he gets SOB with any exertion.   Dyspnea:  W/c bound due to L knee stops before breathing / walking with 3lpm  Cough: none  Sleep: hosp bed stays flat, 2 pillows  SABA use: alb neb qid prn  No obvious day to day or daytime variability or assoc excess/ purulent sputum or mucus plugs or hemoptysis or cp or chest tightness, subjective wheeze or overt sinus or hb symptoms.   sleepoing as above  without nocturnal  or early am exacerbation  of respiratory  c/o's or need for noct saba. Also denies any obvious fluctuation of symptoms with weather or environmental changes or other aggravating or alleviating factors except as outlined above   No unusual exposure hx or h/o childhood pna/ asthma or knowledge of premature birth.  Current Allergies, Complete Past Medical History, Past Surgical History, Family History, and Social History were reviewed in [employer] record.  ROS  The following are not active complaints unless bolded Hoarseness, sore throat, dysphagia, dental problems, itching, sneezing,  nasal congestion or discharge of excess mucus or purulent secretions, ear ache,   fever, chills, sweats, unintended wt loss or wt gain, classically pleuritic or exertional cp,  orthopnea pnd or arm/hand swelling  or leg  swelling, presyncope, palpitations, abdominal pain, anorexia, nausea, vomiting, diarrhea  or change in bowel habits or change in bladder habits, change in stools or change in urine, dysuria, hematuria,  rash, arthralgias, visual complaints, headache, numbness, weakness or ataxia or problems with walking or coordination,  change in mood or  memory.           Past Medical History:  Diagnosis Date  . Arthritis   . 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 CHF (congestive heart failure) (HCC)    diastolic   . Chronic pain   . COPD (chronic obstructive pulmonary disease) ([place])   . Depression   . Diabetes mellitus without complication (HCC)    insulin pump  . Hypercholesterolemia   . Hypertension   . On home O2    3 liters  . Oxygen deficiency   . Schizophrenia ([place])   . Sleep apnea    noncompliant with BiPAP  . Sleep apnea   . Tobacco abuse     Outpatient Medications Prior to Visit  Medication Sig Dispense Refill  . acetaminophen (TYLENOL) 325 MG tablet Take 2 tablets (650 mg total) by mouth every 6 (six) hours as needed for mild pain, fever or headache (or Fever >/= 101). 30 tablet 2  . albuterol (PROVENTIL) (2.5 MG/3ML) 0.083% nebulizer solution Take 3 mLs (2.5 mg total) by nebulization every 6 (six) hours as needed for wheezing or shortness of breath. 75 mL 0  . apixaban (ELIQUIS) 5 MG TABS tablet Take 1 tablet (5 mg total) by mouth 2 (two) times daily. 60 tablet 1  . aspirin EC 81 MG tablet Take 81 mg by mouth daily. Swallow whole.    . bacitracin-polymyxin b (POLYSPORIN) ointment Apply 1 application topically 2 (two) times daily.    . carboxymethylcellulose (  REFRESH PLUS) 0.5 % SOLN Apply 1 drop to eye 3 (three) times daily as needed.    . cholecalciferol (VITAMIN D) 1000 UNITS tablet Take 1,000 Units by mouth 2 (two) times daily.    Marland Kitchen docusate sodium (COLACE) 100 MG capsule Take 100 mg by mouth 2 (two) times daily.    . DULoxetine (CYMBALTA) 30 MG capsule Take 1 capsule (30 mg  total) by mouth every morning. 30 capsule 3  . Fluticasone-Umeclidin-Vilant (TRELEGY ELLIPTA) 100-62.5-25 MCG/INH AEPB Inhale 1 puff into the lungs daily. 28 each 5  . gabapentin (NEURONTIN) 300 MG capsule Take 1 capsule (300 mg total) by mouth at bedtime.    . hydrOXYzine (ATARAX/VISTARIL) 25 MG tablet Take 1 tablet (25 mg total) by mouth every 12 (twelve) hours as needed for anxiety or itching (Anxiety and insomnia or restlessness). 30 tablet 0  . insulin aspart (NOVOLOG) 100 UNIT/ML injection Inject 0-15 Units into the skin 3 (three) times daily with meals. 10 mL 11  . insulin glargine (LANTUS SOLOSTAR) 100 UNIT/ML Solostar Pen Inject 20 Units into the skin at bedtime.    . melatonin 3 MG TABS tablet Take 6 mg by mouth at bedtime.    . metoprolol tartrate (LOPRESSOR) 25 MG tablet Take 1 tablet (25 mg total) by mouth 2 (two) times daily.    . Multiple Vitamins-Minerals (PRESERVISION AREDS 2+MULTI VIT PO) Take 2 tablets by mouth in the morning and at bedtime.    Marland Kitchen omeprazole (PRILOSEC) 20 MG capsule Take 20 mg by mouth 2 (two) times daily before a meal.    . OXYGEN Inhale 3 L into the lungs continuous.    Marland Kitchen QUEtiapine (SEROQUEL) 25 MG tablet Take 1 tablet (25 mg total) by mouth 2 (two) times daily.    . rosuvastatin (CRESTOR) 10 MG tablet Take 10 mg by mouth daily.    Marland Kitchen tiotropium (SPIRIVA) 18 MCG inhalation capsule Place 1 capsule (18 mcg total) into inhaler and inhale daily. 30 capsule 12  . triamcinolone cream (KENALOG) 0.1 % Apply 1 application topically 2 (two) times daily. 30 g 0  . TRULICITY 3 0000000 SOPN Inject 3 mg into the skin every Friday.     No facility-administered medications prior to visit.     Objective:     BP 112/70 (BP Location: Left Arm, Cuff Size: Normal)   Pulse 97   SpO2 92% Comment: on RA  SpO2: 92 % (on RA)   W/c bound wm / slt hoarse   HEENT : pt wearing mask not removed for exam due to covid - 19 concerns/ R esotropic strabismus   NECK :  without  JVD/Nodes/TM/ nl carotid upstrokes bilaterally   LUNGS: no acc muscle use,  Min barrel  contour chest wall with bilateral  slightly decreased bs s audible wheeze and  without cough on insp or exp maneuvers and min  Hyperresonant  to  percussion bilaterally     CV:  RRR  no s3 or murmur or increase in P2, and no edema   ABD:  soft and nontender with pos end  insp Hoover's  in the supine position. No bruits or organomegaly appreciated, bowel sounds nl  MS:   Nl gait/  ext warm without deformities, calf tenderness, cyanosis or clubbing No obvious joint restrictions   SKIN: warm and dry without lesions    NEURO:  alert, approp, nl sensorium with  no motor or cerebellar deficits apparent.        I personally reviewed images  and agree with radiology impression as follows:  CXR:   Portable 11/11/20   No acute cardiopulmonary abnormality.    Assessment   Chronic respiratory failure 02 dep with hypercarbia 02 3lpm chronically with HCO3 35 04/23/13 so likely hypercarbic as well related to OHS > COPD - TSH ok 06/17/13 - HC03 12/16/20  = 27 so likely resolved    rec titrate to keeps sats > 90% but none needed at rest     Dyspnea on exertion  Quit smoking 2014  - off acei effective 05/07/13 > improved 06/17/2013  - 06/17/2013  FEV1  1.91 (52%)  Ratio 76 no change p B2,  DLCO 54% corrects to 100% with mild concavity to f/v curve   Says doing better on trelegy so may have AB component but not classic copd criteria  and clearly does not need to be using two LAMA's here > d/c spiriva.   >>  The proper method of use, as well as anticipated side effects, of an elipta inhaler were discussed and demonstrated to the patient using teach back method   Since really more limited by orthopedic issues than copd and not having aecopd at this point we can see him prn           Each maintenance medication was reviewed in detail including emphasizing most importantly the difference between maintenance and  prns and under what circumstances the prns are to be triggered using an action plan format where appropriate.  Total time for H and P, chart review, counseling, reviewing elipta device(s) and generating customized AVS unique to this office visit / same day charting = 55 min                 Christinia Gully, MD 01/16/2021

## 2021-01-16 NOTE — Progress Notes (Signed)
post void residual= 161  Urological Symptom Review  Patient is experiencing the following symptoms: Hard to postpone urination Leakage of urine Erection problems (male only)   Review of Systems  Gastrointestinal (upper)  : Negative for upper GI symptoms  Gastrointestinal (lower) : Negative for lower GI symptoms  Constitutional : Negative for symptoms  Skin: Negative for skin symptoms  Eyes: Negative for eye symptoms  Ear/Nose/Throat : Negative for Ear/Nose/Throat symptoms  Hematologic/Lymphatic: Negative for Hematologic/Lymphatic symptoms  Cardiovascular : Negative for cardiovascular symptoms  Respiratory : Negative for respiratory symptoms  Endocrine: Negative for endocrine symptoms  Musculoskeletal: Negative for musculoskeletal symptoms  Neurological: Negative for neurological symptoms  Psychologic: Negative for psychiatric symptoms

## 2021-01-16 NOTE — Assessment & Plan Note (Signed)
02 3lpm chronically with HCO3 35 04/23/13 so likely hypercarbic as well related to OHS > COPD - TSH ok 06/17/13 - HC03 12/16/20  = 27 so likely resolved    rec titrate to keeps sats > 90% but none needed at rest

## 2021-01-17 DIAGNOSIS — R2689 Other abnormalities of gait and mobility: Secondary | ICD-10-CM | POA: Diagnosis not present

## 2021-01-17 DIAGNOSIS — M6281 Muscle weakness (generalized): Secondary | ICD-10-CM | POA: Diagnosis not present

## 2021-01-17 DIAGNOSIS — I639 Cerebral infarction, unspecified: Secondary | ICD-10-CM | POA: Diagnosis not present

## 2021-01-17 DIAGNOSIS — R278 Other lack of coordination: Secondary | ICD-10-CM | POA: Diagnosis not present

## 2021-01-18 DIAGNOSIS — M6281 Muscle weakness (generalized): Secondary | ICD-10-CM | POA: Diagnosis not present

## 2021-01-18 DIAGNOSIS — R278 Other lack of coordination: Secondary | ICD-10-CM | POA: Diagnosis not present

## 2021-01-18 DIAGNOSIS — R2689 Other abnormalities of gait and mobility: Secondary | ICD-10-CM | POA: Diagnosis not present

## 2021-01-18 DIAGNOSIS — I639 Cerebral infarction, unspecified: Secondary | ICD-10-CM | POA: Diagnosis not present

## 2021-01-18 DIAGNOSIS — N390 Urinary tract infection, site not specified: Secondary | ICD-10-CM | POA: Diagnosis not present

## 2021-01-19 DIAGNOSIS — R2689 Other abnormalities of gait and mobility: Secondary | ICD-10-CM | POA: Diagnosis not present

## 2021-01-19 DIAGNOSIS — R278 Other lack of coordination: Secondary | ICD-10-CM | POA: Diagnosis not present

## 2021-01-19 DIAGNOSIS — I639 Cerebral infarction, unspecified: Secondary | ICD-10-CM | POA: Diagnosis not present

## 2021-01-19 DIAGNOSIS — M6281 Muscle weakness (generalized): Secondary | ICD-10-CM | POA: Diagnosis not present

## 2021-01-20 DIAGNOSIS — M6281 Muscle weakness (generalized): Secondary | ICD-10-CM | POA: Diagnosis not present

## 2021-01-20 DIAGNOSIS — I639 Cerebral infarction, unspecified: Secondary | ICD-10-CM | POA: Diagnosis not present

## 2021-01-20 DIAGNOSIS — R2689 Other abnormalities of gait and mobility: Secondary | ICD-10-CM | POA: Diagnosis not present

## 2021-01-20 DIAGNOSIS — R278 Other lack of coordination: Secondary | ICD-10-CM | POA: Diagnosis not present

## 2021-01-23 DIAGNOSIS — K219 Gastro-esophageal reflux disease without esophagitis: Secondary | ICD-10-CM | POA: Diagnosis not present

## 2021-01-23 DIAGNOSIS — M6281 Muscle weakness (generalized): Secondary | ICD-10-CM | POA: Diagnosis not present

## 2021-01-23 DIAGNOSIS — E785 Hyperlipidemia, unspecified: Secondary | ICD-10-CM | POA: Diagnosis not present

## 2021-01-23 DIAGNOSIS — I639 Cerebral infarction, unspecified: Secondary | ICD-10-CM | POA: Diagnosis not present

## 2021-01-23 DIAGNOSIS — I10 Essential (primary) hypertension: Secondary | ICD-10-CM | POA: Diagnosis not present

## 2021-01-23 DIAGNOSIS — E1169 Type 2 diabetes mellitus with other specified complication: Secondary | ICD-10-CM | POA: Diagnosis not present

## 2021-01-23 DIAGNOSIS — R2689 Other abnormalities of gait and mobility: Secondary | ICD-10-CM | POA: Diagnosis not present

## 2021-01-23 DIAGNOSIS — F32A Depression, unspecified: Secondary | ICD-10-CM | POA: Diagnosis not present

## 2021-01-23 DIAGNOSIS — G629 Polyneuropathy, unspecified: Secondary | ICD-10-CM | POA: Diagnosis not present

## 2021-01-23 DIAGNOSIS — K5900 Constipation, unspecified: Secondary | ICD-10-CM | POA: Diagnosis not present

## 2021-01-23 DIAGNOSIS — R52 Pain, unspecified: Secondary | ICD-10-CM | POA: Diagnosis not present

## 2021-01-23 DIAGNOSIS — R278 Other lack of coordination: Secondary | ICD-10-CM | POA: Diagnosis not present

## 2021-01-24 DIAGNOSIS — R278 Other lack of coordination: Secondary | ICD-10-CM | POA: Diagnosis not present

## 2021-01-24 DIAGNOSIS — I639 Cerebral infarction, unspecified: Secondary | ICD-10-CM | POA: Diagnosis not present

## 2021-01-24 DIAGNOSIS — R2689 Other abnormalities of gait and mobility: Secondary | ICD-10-CM | POA: Diagnosis not present

## 2021-01-24 DIAGNOSIS — M6281 Muscle weakness (generalized): Secondary | ICD-10-CM | POA: Diagnosis not present

## 2021-01-25 ENCOUNTER — Other Ambulatory Visit: Payer: Self-pay

## 2021-01-25 ENCOUNTER — Ambulatory Visit: Payer: Medicare Other

## 2021-01-25 DIAGNOSIS — M6281 Muscle weakness (generalized): Secondary | ICD-10-CM | POA: Diagnosis not present

## 2021-01-25 DIAGNOSIS — R278 Other lack of coordination: Secondary | ICD-10-CM | POA: Diagnosis not present

## 2021-01-25 DIAGNOSIS — I639 Cerebral infarction, unspecified: Secondary | ICD-10-CM | POA: Diagnosis not present

## 2021-01-25 DIAGNOSIS — R2689 Other abnormalities of gait and mobility: Secondary | ICD-10-CM | POA: Diagnosis not present

## 2021-01-25 NOTE — Therapy (Signed)
[facility] [address] [place], [place], [address] Phone: [phone]   Fax:  [phone]  Patient Details  Name: Rick Cruz MRN: [id number] Date of Birth: Jan 21, 1946 Referring Provider:  Melrose Nakayama, MD  Encounter Date: 01/25/2021  Arrived no charge  Pt arrived for PT eval for left quad tear/history of CVA. Pt is currently residing in skilled nursing facility and will be there long term. PT spoke with pt's wife Rick Cruz on the phone to confirm he was at facility as pt had some confusion thinking worker from facility that brought him was his wife. She reported that Dr. Rhona Raider had said he needed to get therapy at [facility]. PT explained that pt can not get therapy here and at the skilled facility which he is receiving services at. PT called facility and spoke to therapist there, Ebony Hail, to confirm. No evaluation performed due to this.  Electa Sniff, PT, DPT, NCS 01/25/2021, 3:19 PM  [facility] [address] [place], [place], [address] Phone: [phone]   Fax:  [phone]

## 2021-01-26 DIAGNOSIS — R278 Other lack of coordination: Secondary | ICD-10-CM | POA: Diagnosis not present

## 2021-01-26 DIAGNOSIS — M6281 Muscle weakness (generalized): Secondary | ICD-10-CM | POA: Diagnosis not present

## 2021-01-26 DIAGNOSIS — R2689 Other abnormalities of gait and mobility: Secondary | ICD-10-CM | POA: Diagnosis not present

## 2021-01-26 DIAGNOSIS — I639 Cerebral infarction, unspecified: Secondary | ICD-10-CM | POA: Diagnosis not present

## 2021-01-27 DIAGNOSIS — R2689 Other abnormalities of gait and mobility: Secondary | ICD-10-CM | POA: Diagnosis not present

## 2021-01-27 DIAGNOSIS — M6281 Muscle weakness (generalized): Secondary | ICD-10-CM | POA: Diagnosis not present

## 2021-01-27 DIAGNOSIS — I639 Cerebral infarction, unspecified: Secondary | ICD-10-CM | POA: Diagnosis not present

## 2021-01-27 DIAGNOSIS — R278 Other lack of coordination: Secondary | ICD-10-CM | POA: Diagnosis not present

## 2021-01-30 DIAGNOSIS — M6281 Muscle weakness (generalized): Secondary | ICD-10-CM | POA: Diagnosis not present

## 2021-01-30 DIAGNOSIS — I639 Cerebral infarction, unspecified: Secondary | ICD-10-CM | POA: Diagnosis not present

## 2021-01-30 DIAGNOSIS — R2689 Other abnormalities of gait and mobility: Secondary | ICD-10-CM | POA: Diagnosis not present

## 2021-01-30 DIAGNOSIS — R278 Other lack of coordination: Secondary | ICD-10-CM | POA: Diagnosis not present

## 2021-01-31 DIAGNOSIS — R2689 Other abnormalities of gait and mobility: Secondary | ICD-10-CM | POA: Diagnosis not present

## 2021-01-31 DIAGNOSIS — M6281 Muscle weakness (generalized): Secondary | ICD-10-CM | POA: Diagnosis not present

## 2021-01-31 DIAGNOSIS — R278 Other lack of coordination: Secondary | ICD-10-CM | POA: Diagnosis not present

## 2021-01-31 DIAGNOSIS — I639 Cerebral infarction, unspecified: Secondary | ICD-10-CM | POA: Diagnosis not present

## 2021-02-01 DIAGNOSIS — R278 Other lack of coordination: Secondary | ICD-10-CM | POA: Diagnosis not present

## 2021-02-01 DIAGNOSIS — G8194 Hemiplegia, unspecified affecting left nondominant side: Secondary | ICD-10-CM | POA: Diagnosis not present

## 2021-02-01 DIAGNOSIS — R2689 Other abnormalities of gait and mobility: Secondary | ICD-10-CM | POA: Diagnosis not present

## 2021-02-01 DIAGNOSIS — I639 Cerebral infarction, unspecified: Secondary | ICD-10-CM | POA: Diagnosis not present

## 2021-02-01 DIAGNOSIS — I482 Chronic atrial fibrillation, unspecified: Secondary | ICD-10-CM | POA: Diagnosis not present

## 2021-02-01 DIAGNOSIS — G4733 Obstructive sleep apnea (adult) (pediatric): Secondary | ICD-10-CM | POA: Diagnosis not present

## 2021-02-01 DIAGNOSIS — M6281 Muscle weakness (generalized): Secondary | ICD-10-CM | POA: Diagnosis not present

## 2021-02-02 DIAGNOSIS — G4700 Insomnia, unspecified: Secondary | ICD-10-CM | POA: Diagnosis not present

## 2021-02-02 DIAGNOSIS — K219 Gastro-esophageal reflux disease without esophagitis: Secondary | ICD-10-CM | POA: Diagnosis not present

## 2021-02-02 DIAGNOSIS — R2689 Other abnormalities of gait and mobility: Secondary | ICD-10-CM | POA: Diagnosis not present

## 2021-02-02 DIAGNOSIS — I5032 Chronic diastolic (congestive) heart failure: Secondary | ICD-10-CM | POA: Diagnosis not present

## 2021-02-02 DIAGNOSIS — F32A Depression, unspecified: Secondary | ICD-10-CM | POA: Diagnosis not present

## 2021-02-02 DIAGNOSIS — R278 Other lack of coordination: Secondary | ICD-10-CM | POA: Diagnosis not present

## 2021-02-02 DIAGNOSIS — I480 Paroxysmal atrial fibrillation: Secondary | ICD-10-CM | POA: Diagnosis not present

## 2021-02-02 DIAGNOSIS — I639 Cerebral infarction, unspecified: Secondary | ICD-10-CM | POA: Diagnosis not present

## 2021-02-02 DIAGNOSIS — E1169 Type 2 diabetes mellitus with other specified complication: Secondary | ICD-10-CM | POA: Diagnosis not present

## 2021-02-02 DIAGNOSIS — M6281 Muscle weakness (generalized): Secondary | ICD-10-CM | POA: Diagnosis not present

## 2021-02-03 DIAGNOSIS — I639 Cerebral infarction, unspecified: Secondary | ICD-10-CM | POA: Diagnosis not present

## 2021-02-03 DIAGNOSIS — M6281 Muscle weakness (generalized): Secondary | ICD-10-CM | POA: Diagnosis not present

## 2021-02-03 DIAGNOSIS — R278 Other lack of coordination: Secondary | ICD-10-CM | POA: Diagnosis not present

## 2021-02-03 DIAGNOSIS — R2689 Other abnormalities of gait and mobility: Secondary | ICD-10-CM | POA: Diagnosis not present

## 2021-02-06 DIAGNOSIS — M6281 Muscle weakness (generalized): Secondary | ICD-10-CM | POA: Diagnosis not present

## 2021-02-06 DIAGNOSIS — R2689 Other abnormalities of gait and mobility: Secondary | ICD-10-CM | POA: Diagnosis not present

## 2021-02-06 DIAGNOSIS — R278 Other lack of coordination: Secondary | ICD-10-CM | POA: Diagnosis not present

## 2021-02-06 DIAGNOSIS — I639 Cerebral infarction, unspecified: Secondary | ICD-10-CM | POA: Diagnosis not present

## 2021-02-07 DIAGNOSIS — I639 Cerebral infarction, unspecified: Secondary | ICD-10-CM | POA: Diagnosis not present

## 2021-02-07 DIAGNOSIS — R278 Other lack of coordination: Secondary | ICD-10-CM | POA: Diagnosis not present

## 2021-02-07 DIAGNOSIS — M6281 Muscle weakness (generalized): Secondary | ICD-10-CM | POA: Diagnosis not present

## 2021-02-07 DIAGNOSIS — R2689 Other abnormalities of gait and mobility: Secondary | ICD-10-CM | POA: Diagnosis not present

## 2021-02-08 DIAGNOSIS — R2689 Other abnormalities of gait and mobility: Secondary | ICD-10-CM | POA: Diagnosis not present

## 2021-02-08 DIAGNOSIS — M6281 Muscle weakness (generalized): Secondary | ICD-10-CM | POA: Diagnosis not present

## 2021-02-08 DIAGNOSIS — I639 Cerebral infarction, unspecified: Secondary | ICD-10-CM | POA: Diagnosis not present

## 2021-02-08 DIAGNOSIS — R278 Other lack of coordination: Secondary | ICD-10-CM | POA: Diagnosis not present

## 2021-02-09 DIAGNOSIS — R278 Other lack of coordination: Secondary | ICD-10-CM | POA: Diagnosis not present

## 2021-02-09 DIAGNOSIS — M6281 Muscle weakness (generalized): Secondary | ICD-10-CM | POA: Diagnosis not present

## 2021-02-09 DIAGNOSIS — I639 Cerebral infarction, unspecified: Secondary | ICD-10-CM | POA: Diagnosis not present

## 2021-02-09 DIAGNOSIS — R2689 Other abnormalities of gait and mobility: Secondary | ICD-10-CM | POA: Diagnosis not present

## 2021-02-13 ENCOUNTER — Emergency Department (HOSPITAL_COMMUNITY)
Admission: EM | Admit: 2021-02-13 | Discharge: 2021-02-14 | Disposition: A | Discharge status: Home / Self Care | Payer: Medicare Other | Attending: Emergency Medicine | Admitting: Emergency Medicine

## 2021-02-13 ENCOUNTER — Other Ambulatory Visit: Payer: Self-pay

## 2021-02-13 ENCOUNTER — Encounter (HOSPITAL_COMMUNITY): Payer: Self-pay | Admitting: Emergency Medicine

## 2021-02-13 DIAGNOSIS — R404 Transient alteration of awareness: Secondary | ICD-10-CM | POA: Diagnosis not present

## 2021-02-13 DIAGNOSIS — Z7951 Long term (current) use of inhaled steroids: Secondary | ICD-10-CM | POA: Insufficient documentation

## 2021-02-13 DIAGNOSIS — I2510 Atherosclerotic heart disease of native coronary artery without angina pectoris: Secondary | ICD-10-CM | POA: Diagnosis not present

## 2021-02-13 DIAGNOSIS — J449 Chronic obstructive pulmonary disease, unspecified: Secondary | ICD-10-CM | POA: Diagnosis not present

## 2021-02-13 DIAGNOSIS — Z794 Long term (current) use of insulin: Secondary | ICD-10-CM | POA: Diagnosis not present

## 2021-02-13 DIAGNOSIS — Z79899 Other long term (current) drug therapy: Secondary | ICD-10-CM | POA: Insufficient documentation

## 2021-02-13 DIAGNOSIS — E1165 Type 2 diabetes mellitus with hyperglycemia: Secondary | ICD-10-CM | POA: Diagnosis not present

## 2021-02-13 DIAGNOSIS — I110 Hypertensive heart disease with heart failure: Secondary | ICD-10-CM | POA: Diagnosis not present

## 2021-02-13 DIAGNOSIS — Z7901 Long term (current) use of anticoagulants: Secondary | ICD-10-CM | POA: Diagnosis not present

## 2021-02-13 DIAGNOSIS — R739 Hyperglycemia, unspecified: Secondary | ICD-10-CM

## 2021-02-13 DIAGNOSIS — Z743 Need for continuous supervision: Secondary | ICD-10-CM | POA: Diagnosis not present

## 2021-02-13 DIAGNOSIS — I5032 Chronic diastolic (congestive) heart failure: Secondary | ICD-10-CM | POA: Insufficient documentation

## 2021-02-13 DIAGNOSIS — Z87891 Personal history of nicotine dependence: Secondary | ICD-10-CM | POA: Diagnosis not present

## 2021-02-13 DIAGNOSIS — Z7982 Long term (current) use of aspirin: Secondary | ICD-10-CM | POA: Diagnosis not present

## 2021-02-13 DIAGNOSIS — R6889 Other general symptoms and signs: Secondary | ICD-10-CM | POA: Diagnosis not present

## 2021-02-13 LAB — CBG MONITORING, ED
Glucose-Capillary: 388 mg/dL — ABNORMAL HIGH (ref 70–99)
Glucose-Capillary: 496 mg/dL — ABNORMAL HIGH (ref 70–99)

## 2021-02-13 LAB — CBC
HCT: 35.4 % — ABNORMAL LOW (ref 39.0–52.0)
Hemoglobin: 10.4 g/dL — ABNORMAL LOW (ref 13.0–17.0)
MCH: 25.2 pg — ABNORMAL LOW (ref 26.0–34.0)
MCHC: 29.4 g/dL — ABNORMAL LOW (ref 30.0–36.0)
MCV: 85.9 fL (ref 80.0–100.0)
Platelets: 177 10*3/uL (ref 150–400)
RBC: 4.12 MIL/uL — ABNORMAL LOW (ref 4.22–5.81)
RDW: 14.6 % (ref 11.5–15.5)
WBC: 9.8 10*3/uL (ref 4.0–10.5)
nRBC: 0 % (ref 0.0–0.2)

## 2021-02-13 LAB — BASIC METABOLIC PANEL
Anion gap: 7 (ref 5–15)
BUN: 17 mg/dL (ref 8–23)
CO2: 31 mmol/L (ref 22–32)
Calcium: 8.4 mg/dL — ABNORMAL LOW (ref 8.9–10.3)
Chloride: 101 mmol/L (ref 98–111)
Creatinine, Ser: 1.09 mg/dL (ref 0.61–1.24)
GFR, Estimated: >60 mL/min (ref >60)
Glucose, Bld: 443 mg/dL — ABNORMAL HIGH (ref 70–99)
Potassium: 4 mmol/L (ref 3.5–5.1)
Sodium: 139 mmol/L (ref 135–145)

## 2021-02-13 MED ORDER — LACTATED RINGERS IV BOLUS: 1000.0000 mL | Freq: Once | INTRAVENOUS | Status: DC

## 2021-02-13 MED ORDER — INSULIN GLARGINE 100 UNIT/ML ~~LOC~~ SOLN
20.0000 [IU] | Freq: Every day | SUBCUTANEOUS | Status: DC
  Administered 2021-02-13: 20 [IU] via SUBCUTANEOUS
  Filled 2021-02-13: qty 0.2

## 2021-02-13 MED ORDER — LACTATED RINGERS IV BOLUS
500.0000 mL | Freq: Once | INTRAVENOUS | Status: AC
  Administered 2021-02-13: 500 mL via INTRAVENOUS

## 2021-02-13 NOTE — ED Notes (Signed)
Pt unable to sign discharge papers

## 2021-02-13 NOTE — ED Notes (Signed)
Updated pt wife

## 2021-02-13 NOTE — ED Provider Notes (Signed)
[facility] EMERGENCY DEPARTMENT Provider Note   CSN: [id number] Arrival date & time: 02/13/21  2033     History Chief Complaint  Patient presents with  . Hyperglycemia    Rick Cruz is a 75 y.o. male.  HPI Patient is 75 year old male with past medical history detailed in HPI notably has a history of DM2 on insulin long and short acting.  Patient had ice cream/wheat thins and pop tarts today and blood sugar at [facility] was reading high.  He denies any symptoms.  He specifically denies any fevers chills cough congestion lightheadedness dizziness, no urinary symptoms no urinary frequency urgency or dysuria.  States he feels well today.      Past Medical History:  Diagnosis Date  . Arthritis   . 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 CHF (congestive heart failure) (HCC)    diastolic   . Chronic pain   . COPD (chronic obstructive pulmonary disease) ([place])   . Depression   . Diabetes mellitus without complication (HCC)    insulin pump  . Hypercholesterolemia   . Hypertension   . On home O2    3 liters  . Oxygen deficiency   . Schizophrenia ([place])   . Sleep apnea    noncompliant with BiPAP  . Sleep apnea   . Tobacco abuse     Patient Active Problem List   Diagnosis Date Noted  . UTI (urinary tract infection) 12/14/2020  . CVA (cerebral vascular accident) ([place]) 10/12/2020  . Ischemic cerebrovascular accident (CVA) ([place]) 10/11/2020  . Hypoalbuminemia 10/11/2020  . Class 1 obesity 10/11/2020  . Iron deficiency anemia 10/11/2020  . Acute metabolic encephalopathy [id number]  . DKA, type 2, not at goal [facility]) 11/03/2019  . Heart failure ([place]) 02/02/2019  . Hyperglycemia   . Altered mental status 11/21/2018  . Diabetic foot ([place]) 07/23/2018  . Accidental fall 06/25/2018  . Supplemental oxygen dependent 06/25/2018  . 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 Neck pain 01/09/2018  . 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 Diabetic neuropathy ([place]) 12/13/2017  .  Gastroesophageal reflux disease 12/13/2017  . Insomnia 07/30/2017  . Depression 07/19/2016  . OSA (obstructive sleep apnea)   . Chronic respiratory failure with hypercapnia ([place])   . Undifferentiated schizophrenia ([place])   . Uncontrolled type 2 diabetes mellitus with complication ([place])   . Anemia of chronic disease   . Paroxysmal atrial fibrillation (HCC)   . Hyponatremia 06/12/2016  . Aspiration pneumonia ([place]) 08/16/2015  . Seborrheic keratosis 08/04/2015  . Pure hypercholesterolemia 07/05/2015  . Obesity hypoventilation syndrome ([place]) 04/05/2015  . CSA (central sleep apnea) 04/05/2015  . Non compliance with medical treatment 04/05/2015  . Type 2 diabetes mellitus with hyperglycemia ([place]) 02/28/2015  . Acute respiratory failure with hypoxia and hypercarbia (HCC)   . Chronic diastolic CHF (congestive heart failure) ([place]) 08/06/2014  . Recurrent falls 05/08/2014  . Ankle fracture, right 05/08/2014  . Ankle fracture, left 05/08/2014  . C. difficile colitis 01/04/2014  . Delirium 01/02/2014  . Acute encephalopathy 01/02/2014  . Morbid obesity ([place]) 06/17/2013  . Erosive esophagitis 04/22/2013  . Chronic respiratory failure 02 dep with hypercarbia 04/22/2013  . Type 2 diabetes mellitus without complication, with long-term current use of insulin ([place]) 04/21/2013  . 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 CAD (coronary artery disease) 04/18/2013  . Dyspnea on exertion 08/26/2008  . DYSLIPIDEMIA 08/25/2008  . Obstructive sleep apnea 08/25/2008  . Essential hypertension 08/25/2008  . Dyslipidemia 08/25/2008  Past Surgical History:  Procedure Laterality Date  . COLON SURGERY  March 2010   secondary to large colon polyp, final path per discharge summary notes tubulovillous adenoma.   . COLONOSCOPY  July 2011   Dr. Benson Norway: multiple polyps, internal and external hemorrhoids, diverticulosis. tubular adenoma  . ESOPHAGOGASTRODUODENOSCOPY (EGD) WITH PROPOFOL N/A 04/20/2013   Procedure:  ESOPHAGOGASTRODUODENOSCOPY (EGD) WITH PROPOFOL;  Surgeon: Daneil Dolin, MD;  Location: [facility] ORS;  Service: Endoscopy;  Laterality: N/A;  . KNEE SURGERY     X2       Family History  Problem Relation Age of Onset  . Thyroid disease Mother   . Parkinson's disease Father   . Parkinson's disease Other   . Colon cancer Neg Hx     Social History   Tobacco Use  . Smoking status: Former Smoker    Packs/day: 1.00    Years: 50.00    Pack years: 50.00    Types: Cigarettes    Quit date: 09/16/2013    Years since quitting: 7.4  . Smokeless tobacco: Never Used  Substance Use Topics  . Alcohol use: No    Comment: history of ETOH abuse in remote past, none in at least 10 years  . Drug use: No    Home Medications Prior to Admission medications   Medication Sig Start Date End Date Taking? Authorizing Provider  acetaminophen (TYLENOL) 325 MG tablet Take 2 tablets (650 mg total) by mouth every 6 (six) hours as needed for mild pain, fever or headache (or Fever >/= 101). 02/22/20   Roxan Hockey, MD  albuterol (PROVENTIL) (2.5 MG/3ML) 0.083% nebulizer solution Take 3 mLs (2.5 mg total) by nebulization every 6 (six) hours as needed for wheezing or shortness of breath. 02/22/20   Roxan Hockey, MD  apixaban (ELIQUIS) 5 MG TABS tablet Take 1 tablet (5 mg total) by mouth 2 (two) times daily. 02/22/20   Roxan Hockey, MD  aspirin EC 81 MG tablet Take 81 mg by mouth daily. Swallow whole.    [provider]  bacitracin-polymyxin b (POLYSPORIN) ointment Apply 1 application topically 2 (two) times daily.    [provider]  carboxymethylcellulose (REFRESH PLUS) 0.5 % SOLN Apply 1 drop to eye 3 (three) times daily as needed.    [provider]  cholecalciferol (VITAMIN D) 1000 UNITS tablet Take 1,000 Units by mouth 2 (two) times daily.    [provider]  docusate sodium (COLACE) 100 MG capsule Take 100 mg by mouth 2 (two) times daily.    [provider]   DULoxetine (CYMBALTA) 30 MG capsule Take 1 capsule (30 mg total) by mouth every morning. 02/22/20   Roxan Hockey, MD  Fluticasone-Umeclidin-Vilant (TRELEGY ELLIPTA) 100-62.5-25 MCG/INH AEPB Inhale 1 puff into the lungs daily. 02/22/20   Roxan Hockey, MD  gabapentin (NEURONTIN) 300 MG capsule Take 1 capsule (300 mg total) by mouth at bedtime. 10/18/20   Geradine Girt, DO  hydrOXYzine (ATARAX/VISTARIL) 25 MG tablet Take 1 tablet (25 mg total) by mouth every 12 (twelve) hours as needed for anxiety or itching (Anxiety and insomnia or restlessness). 02/22/20   Roxan Hockey, MD  insulin aspart (NOVOLOG) 100 UNIT/ML injection Inject 0-15 Units into the skin 3 (three) times daily with meals. 10/18/20   Geradine Girt, DO  insulin glargine (LANTUS SOLOSTAR) 100 UNIT/ML Solostar Pen Inject 20 Units into the skin at bedtime. 10/18/20   Eulogio Bear U, DO  melatonin 3 MG TABS tablet Take 6 mg by mouth at  bedtime.    [provider]  metoprolol tartrate (LOPRESSOR) 25 MG tablet Take 1 tablet (25 mg total) by mouth 2 (two) times daily. 10/18/20   Geradine Girt, DO  Multiple Vitamins-Minerals (PRESERVISION AREDS 2+MULTI VIT PO) Take 2 tablets by mouth in the morning and at bedtime.    [provider]  omeprazole (PRILOSEC) 20 MG capsule Take 20 mg by mouth 2 (two) times daily before a meal.    [provider]  OXYGEN Inhale 3 L into the lungs continuous.    [provider]  QUEtiapine (SEROQUEL) 25 MG tablet Take 1 tablet (25 mg total) by mouth 2 (two) times daily. 12/18/20   Barton Dubois, MD  rosuvastatin (CRESTOR) 10 MG tablet Take 10 mg by mouth daily.    [provider]  tiotropium (SPIRIVA) 18 MCG inhalation capsule Place 1 capsule (18 mcg total) into inhaler and inhale daily. 02/22/20   Roxan Hockey, MD  triamcinolone cream (KENALOG) 0.1 % Apply 1 application topically 2 (two) times daily. 02/11/20   Avegno, Darrelyn Hillock, FNP  TRULICITY 3 0000000 SOPN Inject 3  mg into the skin every Friday. 06/07/20   [provider]    Allergies    Phenergan [promethazine hcl], Haldol [haloperidol lactate], and Metformin and related  Review of Systems   Review of Systems  Constitutional: Negative for chills and fever.  HENT: Negative for congestion.   Eyes: Negative for pain.  Respiratory: Negative for cough and shortness of breath.   Cardiovascular: Negative for chest pain and leg swelling.  Gastrointestinal: Negative for abdominal pain and vomiting.  Genitourinary: Negative for dysuria.  Musculoskeletal: Negative for myalgias.  Skin: Negative for rash.  Neurological: Negative for dizziness and headaches.    Physical Exam Updated Vital Signs BP (!) 164/61   Pulse 82   Temp 98.5 F (36.9 C) (Oral)   Resp 14   Ht 6' (1.829 m)   Wt 104.3 kg   SpO2 93%   BMI 31.19 kg/m   Physical Exam Vitals and nursing note reviewed.  Constitutional:      General: He is not in acute distress. HENT:     Head: Normocephalic and atraumatic.     Nose: Nose normal.  Eyes:     General: No scleral icterus. Cardiovascular:     Rate and Rhythm: Normal rate and regular rhythm.     Pulses: Normal pulses.     Heart sounds: Normal heart sounds.  Pulmonary:     Effort: Pulmonary effort is normal. No respiratory distress.     Breath sounds: No wheezing.  Abdominal:     Palpations: Abdomen is soft.     Tenderness: There is no abdominal tenderness.  Musculoskeletal:     Cervical back: Normal range of motion.     Right lower leg: No edema.     Left lower leg: No edema.  Skin:    General: Skin is warm and dry.     Capillary Refill: Capillary refill takes less than 2 seconds.  Neurological:     Mental Status: He is alert and oriented to person, place, and time. Mental status is at baseline.     Comments: Moves all 4 extremities.  Strength intact in all 4 extremities.  Psychiatric:        Mood and Affect: Mood normal.        Behavior: Behavior normal.      ED Results / Procedures / Treatments   Labs (all labs ordered are listed,  but only abnormal results are displayed) Labs Reviewed  BASIC METABOLIC PANEL - Abnormal; Notable for the following components:      Result Value   Glucose, Bld 443 (*)    Calcium 8.4 (*)    All other components within normal limits  CBC - Abnormal; Notable for the following components:   RBC 4.12 (*)    Hemoglobin 10.4 (*)    HCT 35.4 (*)    MCH 25.2 (*)    MCHC 29.4 (*)    All other components within normal limits  CBG MONITORING, ED - Abnormal; Notable for the following components:   Glucose-Capillary 496 (*)    All other components within normal limits  CBG MONITORING, ED - Abnormal; Notable for the following components:   Glucose-Capillary 388 (*)    All other components within normal limits    EKG None  Radiology No results found.  Procedures Procedures   Medications Ordered in ED Medications  insulin glargine (LANTUS) Solostar Pen 20 Units (has no administration in time range)  lactated ringers bolus 500 mL (500 mLs Intravenous New Bag/Given 02/13/21 2221)    ED Course  I have reviewed the triage vital signs and the nursing notes.  Pertinent labs & imaging results that were available during my care of the patient were reviewed by me and considered in my medical decision making (see chart for details).  Clinical Course as of 02/13/21 2335  Mon Feb 13, 2021  2232 Discussed with RN because lab work was ordered 45 minutes ago -- phlebotomy is on the way. Will attempt to obtain straight stick if no sample in the next 10 min.  [WF]    Clinical Course User Index [WF] Tedd Sias, PA   MDM Rules/Calculators/A&P                          Patient is 75 year old male with past medical history detailed in HPI notably has a history of DM2 on insulin long and short acting.  Patient had ice cream/wheat thins and pop tarts today and blood sugar at [facility] was reading high.  He denies any  symptoms.  He is well-appearing on physical exam we will obtain BMP CBC and anticipate small amount of insulin and small mount of fluids and discharged home/[place].  CBC without leukocytosis, stable anemia.  BMP unremarkable apart from CBG of 443.  Repeat CBG 388.  Will provide patient with his home Lantus.  Discussed with Dr. Dina Rich prior to discharge.  Discussed with patient's wife who is agreeable to plan understanding of plan.  Patient has no complaints at this time.  Will discharge home.  Final Clinical Impression(s) / ED Diagnoses Final diagnoses:  Hyperglycemia    Rx / DC Orders ED Discharge Orders    None       Tedd Sias, [place] 02/13/21 2336    Merryl Hacker, MD 02/14/21 [phone]

## 2021-02-13 NOTE — ED Notes (Signed)
EMS called for transport back to [facility]

## 2021-02-13 NOTE — Discharge Instructions (Addendum)
Your blood sugar improved to 388 with a small amount of fluids and some time.  I have provided you with your nighttime dose of Lantus. Please check your blood sugar tomorrow morning.

## 2021-02-13 NOTE — ED Notes (Signed)
CBG in triage 496

## 2021-02-13 NOTE — ED Triage Notes (Signed)
Per [facility] pt's wife visiting him today and brought him sweets; pt CBG now reads "HIGH" at facility

## 2021-02-13 NOTE — ED Notes (Signed)
Report given to [facility] nurse

## 2021-02-22 DIAGNOSIS — M25562 Pain in left knee: Secondary | ICD-10-CM | POA: Diagnosis not present

## 2021-02-23 DIAGNOSIS — L110 Acquired keratosis follicularis: Secondary | ICD-10-CM | POA: Diagnosis not present

## 2021-02-23 DIAGNOSIS — E1140 Type 2 diabetes mellitus with diabetic neuropathy, unspecified: Secondary | ICD-10-CM | POA: Diagnosis not present

## 2021-02-23 DIAGNOSIS — M79675 Pain in left toe(s): Secondary | ICD-10-CM | POA: Diagnosis not present

## 2021-02-23 DIAGNOSIS — I739 Peripheral vascular disease, unspecified: Secondary | ICD-10-CM | POA: Diagnosis not present

## 2021-02-23 DIAGNOSIS — M79671 Pain in right foot: Secondary | ICD-10-CM | POA: Diagnosis not present

## 2021-02-23 DIAGNOSIS — M79672 Pain in left foot: Secondary | ICD-10-CM | POA: Diagnosis not present

## 2021-02-23 DIAGNOSIS — M79674 Pain in right toe(s): Secondary | ICD-10-CM | POA: Diagnosis not present

## 2021-02-28 ENCOUNTER — Ambulatory Visit: Payer: Medicare Other

## 2021-02-28 ENCOUNTER — Other Ambulatory Visit: Payer: Self-pay

## 2021-02-28 ENCOUNTER — Ambulatory Visit: Payer: Medicare Other | Attending: Orthopaedic Surgery

## 2021-02-28 DIAGNOSIS — M6281 Muscle weakness (generalized): Secondary | ICD-10-CM | POA: Insufficient documentation

## 2021-02-28 DIAGNOSIS — S76112D Strain of left quadriceps muscle, fascia and tendon, subsequent encounter: Secondary | ICD-10-CM | POA: Insufficient documentation

## 2021-02-28 DIAGNOSIS — R2681 Unsteadiness on feet: Secondary | ICD-10-CM | POA: Insufficient documentation

## 2021-03-01 ENCOUNTER — Telehealth: Payer: Self-pay

## 2021-03-01 DIAGNOSIS — R053 Chronic cough: Secondary | ICD-10-CM | POA: Diagnosis not present

## 2021-03-01 NOTE — Therapy (Signed)
[facility] [address] [place], [place], [address] Phone: [phone]   Fax:  [phone]  Physical Therapy Treatment  Patient Details  Name: Rick Cruz MRN: [id number] Date of Birth: 23-Mar-1946 Referring Provider (PT): Dr. 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    COLONOSCOPY  July 2011   Dr. Benson Norway: multiple polyps, internal and external hemorrhoids, diverticulosis. tubular adenoma   ESOPHAGOGASTRODUODENOSCOPY (EGD) WITH PROPOFOL N/A 04/20/2013   Procedure: ESOPHAGOGASTRODUODENOSCOPY (EGD) WITH PROPOFOL;  Surgeon: Daneil Dolin, MD;  Location: [facility];  Service: Endoscopy;  Laterality: N/A;   KNEE SURGERY     X2    There were no vitals filed for this visit.   Subjective Assessment - 03/01/21 1505     Subjective Patient with underlying vascular dementia but able to answer questions appropriately in session today, reporting no pain,  wishes to gain mobility and ambulate short distances    Pertinent History Referred 01/18/21 Melrose Nakayama, MD for left knee quad tear/ history of CVA  PMH: arthritis, blind right eye due to CVA, CHF, chronic pain, COPD, depression, DM, HTN, 3L home O2, schizoprenia, sleep apnea, right pontine infarct 09/2020    Per Que at Dr. Jerald Kief pt has no restrictions  Resides at [facility].    Limitations Standing;Walking    How long can you sit comfortably? unlimited    How long can you stand comfortably? <5 min    How long can you walk comfortably? 33f    Currently in Pain? No/denies                                       PT Education - 03/01/21 1507     Education Details Patient educated on eval findings, rehab potential and POC and is in agreement    Person(s) Educated Patient    Methods Explanation    Comprehension Verbalized understanding              PT Short Term Goals - 03/01/21 1750       PT SHORT TERM GOAL #1  Title Patient to transfer sit/stand with SBA    Baseline PAtient able to tarnsfersit/stand with MinA/CGA    Time 2    Period Weeks    Status New    Target Date 03/15/21      PT SHORT TERM GOAL #2   Title Patient to ambulate 82f with RW and Min A with appropriate LLE brace    Baseline Unable to ambulate safely due to need of appropriate L knee barce    Time 2    Period Weeks    Status New    Target Date 03/15/21      PT SHORT TERM GOAL #3   Title Develop HEP for facility staff to perform with patient    Baseline TBD    Time 2    Period Weeks    Status New    Target Date 03/15/21               PT Long Term Goals - 03/01/21 1755       PT LONG TERM GOAL #1   Title Patient to perform sit/stand transfers with S    Baseline CGA/MinA to transfer with L knee immobilizer    Time 4    Period Weeks    Status New    Target Date 03/29/21      PT LONG TERM GOAL #2   Title Patient to ambulate 89fwith RW and appropriate  brace with CGA    Baseline UTA due to improper brace    Time 4    Period Weeks    Status New    Target Date 03/29/21      PT LONG TERM GOAL #3   Title Obtain appropriate brace for LLE to assist in maintaining knee extension    Baseline TBD    Time 4    Period Weeks    Status New    Target Date 03/29/21                   Plan - 03/01/21 1511     Clinical Impression Statement Patient referred to OP neuro PT for gait gait and balance training, [facility] [place], she was unable to answer questions or participate in his evaluation preferring to remain in waiting room.  Patient on 3L continuous O2 and reclined in WC, reason unknown.  L knee immobilizer in place but poorly fiited with velcro straps worn or broken.  PAtient demonstartes marked atrophy in L quad and demobstartes minimal AROM.  PROM restricted as noted in eval with -26d lack of extension and undermining immobilzer purpose.  PAtient able to sit/stand transfer with MinA/CGA with needed use of UEs.  Ambulation was deferred due to poor fitting immobilizer and lack of L knee control    Personal Factors and Comorbidities Age;Comorbidity 3+    Comorbidities CVA, R blindness, COPD, depression, schizoprenia    Examination-Activity Limitations Stand;Stairs;Locomotion Level;Transfers    Stability/Clinical Decision Making Evolving/Moderate complexity    Clinical Decision Making Moderate    Rehab Potential Fair    PT Frequency 2x / week    PT Duration 4 weeks    PT Treatment/Interventions ADLs/Self Care Home Management;Gait training;Stair training;Functional mobility training;Therapeutic activities;Therapeutic exercise;Balance training;Neuromuscular re-education;Patient/family education;DME Instruction    PT Next Visit Plan Assess ambulation as sfety allows(parallel bars), assess gait  speed  PT Home Exercise Plan TBD    Consulted and Agree with Plan of Care Patient             Patient will benefit from skilled therapeutic intervention in order to improve the following deficits and impairments:  Abnormal gait, Decreased range of motion, Difficulty walking, Decreased endurance, Decreased activity tolerance, Decreased balance, Decreased mobility, Decreased strength  Visit Diagnosis: Muscle weakness (generalized)  Unsteadiness on feet  Rupture of left quadriceps muscle, subsequent encounter     Problem List Patient Active Problem List   Diagnosis Date Noted   UTI (urinary tract infection) 12/14/2020   CVA (cerebral vascular accident) ([place]) 10/12/2020   Ischemic cerebrovascular accident (CVA) ([place]) 10/11/2020   Hypoalbuminemia 10/11/2020   Class 1 obesity 10/11/2020   Iron deficiency anemia [id number]   Acute metabolic encephalopathy [id number]   DKA, type 2, not at goal [facility]) 11/03/2019   Heart failure ([place]) 02/02/2019   Hyperglycemia    Altered mental status 11/21/2018   Diabetic foot ([place]) 07/23/2018   Accidental fall 06/25/2018   Supplemental oxygen dependent 06/25/2018   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   Neck pain 01/09/2018   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   Diabetic neuropathy ([place]) 12/13/2017   Gastroesophageal reflux disease 12/13/2017   Insomnia 07/30/2017   Depression 07/19/2016   OSA (obstructive sleep apnea)    Chronic respiratory failure with hypercapnia ([place])    Undifferentiated schizophrenia ([place])    Uncontrolled type 2 diabetes mellitus with complication (HCC)    Anemia of chronic disease    Paroxysmal atrial fibrillation (HCC)    Hyponatremia 06/12/2016   Aspiration pneumonia ([place]) 08/16/2015   Seborrheic keratosis 08/04/2015   Pure hypercholesterolemia 07/05/2015   Obesity hypoventilation syndrome ([place]) 04/05/2015   CSA (central sleep apnea) 04/05/2015   Non compliance with medical treatment 04/05/2015   Type 2  diabetes mellitus with hyperglycemia ([place]) 02/28/2015   Acute respiratory failure with hypoxia and hypercarbia (HCC)    Chronic diastolic CHF (congestive heart failure) ([place]) 08/06/2014   Recurrent falls 05/08/2014   Ankle fracture, right 05/08/2014   Ankle fracture, left 05/08/2014   C. difficile colitis 01/04/2014   Delirium 01/02/2014   Acute encephalopathy 01/02/2014   Morbid obesity ([place]) 06/17/2013   Erosive esophagitis 04/22/2013   Chronic respiratory failure 02 dep with hypercarbia 04/22/2013   Type 2 diabetes mellitus without complication, with long-term current use of insulin ([place]) 04/21/2013   Paranoid schizophrenia ([place]) 04/18/2013   CAD (coronary artery disease) 04/18/2013   Dyspnea on exertion 08/26/2008   DYSLIPIDEMIA 08/25/2008   Obstructive sleep apnea 08/25/2008   Essential hypertension 08/25/2008   Dyslipidemia 08/25/2008    Jacqulynn Cadet Ziasia Lenoir PT 03/01/2021, 6:00 PM  [facility] [address] [place], [place], [address] Phone: [phone]   Fax:  [phone]  Name: Rick Cruz MRN: [id number] Date of Birth: 12/10/1945

## 2021-03-02 ENCOUNTER — Emergency Department (HOSPITAL_COMMUNITY): Payer: Medicare Other

## 2021-03-02 ENCOUNTER — Other Ambulatory Visit: Payer: Self-pay

## 2021-03-02 ENCOUNTER — Encounter (HOSPITAL_COMMUNITY): Payer: Self-pay | Admitting: Emergency Medicine

## 2021-03-02 ENCOUNTER — Emergency Department (HOSPITAL_COMMUNITY)
Admission: EM | Admit: 2021-03-02 | Discharge: 2021-03-02 | Disposition: A | Discharge status: Home / Self Care | Payer: Medicare Other | Attending: Emergency Medicine | Admitting: Emergency Medicine

## 2021-03-02 DIAGNOSIS — I5032 Chronic diastolic (congestive) heart failure: Secondary | ICD-10-CM | POA: Diagnosis not present

## 2021-03-02 DIAGNOSIS — I2510 Atherosclerotic heart disease of native coronary artery without angina pectoris: Secondary | ICD-10-CM | POA: Diagnosis not present

## 2021-03-02 DIAGNOSIS — N184 Chronic kidney disease, stage 4 (severe): Secondary | ICD-10-CM | POA: Diagnosis not present

## 2021-03-02 DIAGNOSIS — Z87891 Personal history of nicotine dependence: Secondary | ICD-10-CM | POA: Diagnosis not present

## 2021-03-02 DIAGNOSIS — I110 Hypertensive heart disease with heart failure: Secondary | ICD-10-CM | POA: Diagnosis not present

## 2021-03-02 DIAGNOSIS — E1140 Type 2 diabetes mellitus with diabetic neuropathy, unspecified: Secondary | ICD-10-CM | POA: Diagnosis not present

## 2021-03-02 DIAGNOSIS — Z79899 Other long term (current) drug therapy: Secondary | ICD-10-CM | POA: Insufficient documentation

## 2021-03-02 DIAGNOSIS — Z7982 Long term (current) use of aspirin: Secondary | ICD-10-CM | POA: Diagnosis not present

## 2021-03-02 DIAGNOSIS — Z7901 Long term (current) use of anticoagulants: Secondary | ICD-10-CM | POA: Insufficient documentation

## 2021-03-02 DIAGNOSIS — I509 Heart failure, unspecified: Secondary | ICD-10-CM | POA: Diagnosis not present

## 2021-03-02 DIAGNOSIS — R0902 Hypoxemia: Secondary | ICD-10-CM | POA: Diagnosis not present

## 2021-03-02 DIAGNOSIS — Z20822 Contact with and (suspected) exposure to covid-19: Secondary | ICD-10-CM | POA: Diagnosis not present

## 2021-03-02 DIAGNOSIS — Z794 Long term (current) use of insulin: Secondary | ICD-10-CM | POA: Diagnosis not present

## 2021-03-02 DIAGNOSIS — J449 Chronic obstructive pulmonary disease, unspecified: Secondary | ICD-10-CM | POA: Insufficient documentation

## 2021-03-02 DIAGNOSIS — Z7401 Bed confinement status: Secondary | ICD-10-CM | POA: Diagnosis not present

## 2021-03-02 DIAGNOSIS — R69 Illness, unspecified: Secondary | ICD-10-CM | POA: Diagnosis not present

## 2021-03-02 DIAGNOSIS — Z7951 Long term (current) use of inhaled steroids: Secondary | ICD-10-CM | POA: Diagnosis not present

## 2021-03-02 DIAGNOSIS — R5381 Other malaise: Secondary | ICD-10-CM | POA: Diagnosis not present

## 2021-03-02 DIAGNOSIS — Z743 Need for continuous supervision: Secondary | ICD-10-CM | POA: Diagnosis not present

## 2021-03-02 DIAGNOSIS — E119 Type 2 diabetes mellitus without complications: Secondary | ICD-10-CM | POA: Diagnosis not present

## 2021-03-02 DIAGNOSIS — R0600 Dyspnea, unspecified: Secondary | ICD-10-CM | POA: Diagnosis present

## 2021-03-02 LAB — CBC WITH DIFFERENTIAL/PLATELET
Abs Immature Granulocytes: 0.15 10*3/uL — ABNORMAL HIGH (ref 0.00–0.07)
Basophils Absolute: 0.1 10*3/uL (ref 0.0–0.1)
Basophils Relative: 0 %
Eosinophils Absolute: 0.2 10*3/uL (ref 0.0–0.5)
Eosinophils Relative: 1 %
HCT: 35.8 % — ABNORMAL LOW (ref 39.0–52.0)
Hemoglobin: 10.8 g/dL — ABNORMAL LOW (ref 13.0–17.0)
Immature Granulocytes: 1 %
Lymphocytes Relative: 11 %
Lymphs Abs: 2.1 10*3/uL (ref 0.7–4.0)
MCH: 25.7 pg — ABNORMAL LOW (ref 26.0–34.0)
MCHC: 30.2 g/dL (ref 30.0–36.0)
MCV: 85.2 fL (ref 80.0–100.0)
Monocytes Absolute: 1 10*3/uL (ref 0.1–1.0)
Monocytes Relative: 5 %
Neutro Abs: 15.5 10*3/uL — ABNORMAL HIGH (ref 1.7–7.7)
Neutrophils Relative %: 82 %
Platelets: 226 10*3/uL (ref 150–400)
RBC: 4.2 MIL/uL — ABNORMAL LOW (ref 4.22–5.81)
RDW: 14.1 % (ref 11.5–15.5)
WBC: 19 10*3/uL — ABNORMAL HIGH (ref 4.0–10.5)
nRBC: 0 % (ref 0.0–0.2)

## 2021-03-02 LAB — COMPREHENSIVE METABOLIC PANEL
ALT: 13 U/L (ref 0–44)
AST: 10 U/L — ABNORMAL LOW (ref 15–41)
Albumin: 3 g/dL — ABNORMAL LOW (ref 3.5–5.0)
Alkaline Phosphatase: 77 U/L (ref 38–126)
Anion gap: 6 (ref 5–15)
BUN: 20 mg/dL (ref 8–23)
CO2: 30 mmol/L (ref 22–32)
Calcium: 8.9 mg/dL (ref 8.9–10.3)
Chloride: 98 mmol/L (ref 98–111)
Creatinine, Ser: 0.98 mg/dL (ref 0.61–1.24)
GFR, Estimated: >60 mL/min (ref >60)
Glucose, Bld: 215 mg/dL — ABNORMAL HIGH (ref 70–99)
Potassium: 4.3 mmol/L (ref 3.5–5.1)
Sodium: 134 mmol/L — ABNORMAL LOW (ref 135–145)
Total Bilirubin: 0.4 mg/dL (ref 0.3–1.2)
Total Protein: 6.9 g/dL (ref 6.5–8.1)

## 2021-03-02 LAB — TROPONIN I (HIGH SENSITIVITY): Troponin I (High Sensitivity): 10 ng/L (ref <18)

## 2021-03-02 LAB — BRAIN NATRIURETIC PEPTIDE: B Natriuretic Peptide: 130 pg/mL — ABNORMAL HIGH (ref 0.0–100.0)

## 2021-03-02 MED ORDER — AZITHROMYCIN 250 MG PO TABS: 250.0000 mg | ORAL_TABLET | Freq: Every day | ORAL | 0 refills | Status: AC

## 2021-03-02 MED ORDER — AZITHROMYCIN 250 MG PO TABS
500.0000 mg | ORAL_TABLET | Freq: Once | ORAL | Status: AC
  Administered 2021-03-02: 500 mg via ORAL
  Filled 2021-03-02: qty 2

## 2021-03-02 NOTE — ED Provider Notes (Signed)
[facility] EMERGENCY DEPARTMENT Provider Note   CSN: [id number] Arrival date & time: 03/02/21  [id number]     History Chief Complaint  Patient presents with   hypoxia    Rick Cruz is a 75 y.o. male.  Sent here for reported dyspnea because of wavering O2. 89-91 reportedly. Patient on 4L on arrival with no complaints. Supposed to be on 2-3 liters at home.        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   CHF (congestive heart failure) (HCC)    diastolic    Chronic pain    COPD (chronic obstructive pulmonary disease) (HCC)    Depression    Diabetes mellitus without complication (HCC)    insulin pump   Hypercholesterolemia    Hypertension    On home O2    3 liters   Oxygen deficiency    Schizophrenia ([place])    Sleep apnea    noncompliant with BiPAP   Sleep apnea    Tobacco abuse     Patient Active Problem List   Diagnosis Date Noted   UTI (urinary tract infection) 12/14/2020   CVA (cerebral vascular accident) ([place]) 10/12/2020   Ischemic cerebrovascular accident (CVA) ([place]) 10/11/2020   Hypoalbuminemia 10/11/2020   Class 1 obesity 10/11/2020   Iron deficiency anemia [id number]   Acute metabolic encephalopathy [id number]   DKA, type 2, not at goal [facility]) 11/03/2019   Heart failure ([place]) 02/02/2019   Hyperglycemia    Altered mental status 11/21/2018   Diabetic foot ([place]) 07/23/2018   Accidental fall 06/25/2018   Supplemental oxygen dependent 06/25/2018   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   Neck pain 01/09/2018   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   Diabetic neuropathy ([place]) 12/13/2017   Gastroesophageal reflux disease 12/13/2017   Insomnia 07/30/2017   Depression 07/19/2016   OSA (obstructive sleep apnea)    Chronic respiratory failure with hypercapnia ([place])    Undifferentiated schizophrenia ([place])    Uncontrolled type 2 diabetes mellitus with complication (HCC)    Anemia of chronic disease     Paroxysmal atrial fibrillation (HCC)    Hyponatremia 06/12/2016   Aspiration pneumonia ([place]) 08/16/2015   Seborrheic keratosis 08/04/2015   Pure hypercholesterolemia 07/05/2015   Obesity hypoventilation syndrome ([place]) 04/05/2015   CSA (central sleep apnea) 04/05/2015   Non compliance with medical treatment 04/05/2015   Type 2 diabetes mellitus with hyperglycemia ([place]) 02/28/2015   Acute respiratory failure with hypoxia and hypercarbia (HCC)    Chronic diastolic CHF (congestive heart failure) ([place]) 08/06/2014   Recurrent falls 05/08/2014   Ankle fracture, right 05/08/2014   Ankle fracture, left 05/08/2014   C. 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    COLONOSCOPY  July 2011   Dr. Benson Norway: multiple polyps, internal  and external hemorrhoids, diverticulosis. tubular adenoma   ESOPHAGOGASTRODUODENOSCOPY (EGD) WITH PROPOFOL N/A 04/20/2013   Procedure: ESOPHAGOGASTRODUODENOSCOPY (EGD) WITH PROPOFOL;  Surgeon: Daneil Dolin, MD;  Location: [facility];  Service: Endoscopy;  Laterality: N/A;   KNEE SURGERY     X2       Family History  Problem Relation Age of Onset   Thyroid disease Mother    Parkinson's disease Father    Parkinson's disease Other    Colon cancer Neg Hx     Social History    Tobacco Use   Smoking status: Former    Packs/day: 1.00    Years: 50.00    Pack years: 50.00    Types: Cigarettes    Quit date: 09/16/2013    Years since quitting: 7.4   Smokeless tobacco: Never  Substance Use Topics   Alcohol use: No    Comment: history of ETOH abuse in remote past, none in at least 10 years   Drug use: No    Home Medications Prior to Admission medications   Medication Sig Start Date End Date Taking? Authorizing Provider  azithromycin (ZITHROMAX) 250 MG tablet Take 1 tablet (250 mg total) by mouth daily. Take first 2 tablets together, then 1 every day until finished. 03/02/21  Yes Kiara Mcdowell, Corene Cornea, MD  acetaminophen (TYLENOL) 325 MG tablet Take 2 tablets (650 mg total) by mouth every 6 (six) hours as needed for mild pain, fever or headache (or Fever >/= 101). 02/22/20   Roxan Hockey, MD  albuterol (PROVENTIL) (2.5 MG/3ML) 0.083% nebulizer solution Take 3 mLs (2.5 mg total) by nebulization every 6 (six) hours as needed for wheezing or shortness of breath. 02/22/20   Roxan Hockey, MD  apixaban (ELIQUIS) 5 MG TABS tablet Take 1 tablet (5 mg total) by mouth 2 (two) times daily. 02/22/20   Roxan Hockey, MD  aspirin EC 81 MG tablet Take 81 mg by mouth daily. Swallow whole.    [provider]  bacitracin-polymyxin b (POLYSPORIN) ointment Apply 1 application topically 2 (two) times daily.    [provider]  carboxymethylcellulose (REFRESH PLUS) 0.5 % SOLN Apply 1 drop to eye 3 (three) times daily as needed.    [provider]  cholecalciferol (VITAMIN D) 1000 UNITS tablet Take 1,000 Units by mouth 2 (two) times daily.    [provider]  docusate sodium (COLACE) 100 MG capsule Take 100 mg by mouth 2 (two) times daily.    [provider]  DULoxetine (CYMBALTA) 30 MG capsule Take 1 capsule (30 mg total) by mouth every morning. 02/22/20   Roxan Hockey, MD  Fluticasone-Umeclidin-Vilant (TRELEGY ELLIPTA) 100-62.5-25 MCG/INH AEPB  Inhale 1 puff into the lungs daily. 02/22/20   Roxan Hockey, MD  gabapentin (NEURONTIN) 300 MG capsule Take 1 capsule (300 mg total) by mouth at bedtime. 10/18/20   Geradine Girt, DO  hydrOXYzine (ATARAX/VISTARIL) 25 MG tablet Take 1 tablet (25 mg total) by mouth every 12 (twelve) hours as needed for anxiety or itching (Anxiety and insomnia or restlessness). 02/22/20   Roxan Hockey, MD  insulin aspart (NOVOLOG) 100 UNIT/ML injection Inject 0-15 Units into the skin 3 (three) times daily with meals. 10/18/20   Geradine Girt, DO  insulin glargine (LANTUS SOLOSTAR) 100 UNIT/ML Solostar Pen Inject 20 Units into the skin at bedtime. 10/18/20   Eulogio Bear U, DO  melatonin 3 MG TABS tablet Take 6 mg by mouth at bedtime.    [provider]  metoprolol tartrate (LOPRESSOR) 25 MG  tablet Take 1 tablet (25 mg total) by mouth 2 (two) times daily. 10/18/20   Geradine Girt, DO  Multiple Vitamins-Minerals (PRESERVISION AREDS 2+MULTI VIT PO) Take 2 tablets by mouth in the morning and at bedtime.    [provider]  omeprazole (PRILOSEC) 20 MG capsule Take 20 mg by mouth 2 (two) times daily before a meal.    [provider]  OXYGEN Inhale 3 L into the lungs continuous.    [provider]  QUEtiapine (SEROQUEL) 25 MG tablet Take 1 tablet (25 mg total) by mouth 2 (two) times daily. 12/18/20   Barton Dubois, MD  rosuvastatin (CRESTOR) 10 MG tablet Take 10 mg by mouth daily.    [provider]  tiotropium (SPIRIVA) 18 MCG inhalation capsule Place 1 capsule (18 mcg total) into inhaler and inhale daily. 02/22/20   Roxan Hockey, MD  triamcinolone cream (KENALOG) 0.1 % Apply 1 application topically 2 (two) times daily. 02/11/20   Avegno, Darrelyn Hillock, FNP  TRULICITY 3 0000000 SOPN Inject 3 mg into the skin every Friday. 06/07/20   [provider]    Allergies    Phenergan [promethazine hcl], Haldol [haloperidol lactate], and Metformin and related  Review of Systems    Review of Systems  All other systems reviewed and are negative.  Physical Exam Updated Vital Signs BP 120/68   Pulse 83   Temp 98.6 F (37 C) (Oral)   Resp 18   Ht 6' (1.829 m)   Wt 105 kg   SpO2 98%   BMI 31.39 kg/m   Physical Exam Vitals and nursing note reviewed.  Constitutional:      Appearance: He is well-developed.  HENT:     Head: Normocephalic and atraumatic.     Mouth/Throat:     Mouth: Mucous membranes are moist.     Pharynx: Oropharynx is clear.  Eyes:     Pupils: Pupils are equal, round, and reactive to light.  Cardiovascular:     Rate and Rhythm: Normal rate.  Pulmonary:     Effort: Pulmonary effort is normal. No respiratory distress.  Abdominal:     General: There is no distension.  Musculoskeletal:        General: Normal range of motion.     Cervical back: Normal range of motion.  Skin:    General: Skin is warm and dry.     Coloration: Skin is not jaundiced or pale.  Neurological:     General: No focal deficit present.     Mental Status: He is alert. He is disoriented.    ED Results / Procedures / Treatments   Labs (all labs ordered are listed, but only abnormal results are displayed) Labs Reviewed  CBC WITH DIFFERENTIAL/PLATELET - Abnormal; Notable for the following components:      Result Value   WBC 19.0 (*)    RBC 4.20 (*)    Hemoglobin 10.8 (*)    HCT 35.8 (*)    MCH 25.7 (*)    Neutro Abs 15.5 (*)    Abs Immature Granulocytes 0.15 (*)    All other components within normal limits  COMPREHENSIVE METABOLIC PANEL - Abnormal; Notable for the following components:   Sodium 134 (*)    Glucose, Bld 215 (*)    Albumin 3.0 (*)    AST 10 (*)    All other components within normal limits  BRAIN NATRIURETIC PEPTIDE - Abnormal; Notable for the following components:   B Natriuretic Peptide 130.0 (*)  All other components within normal limits  SARS CORONAVIRUS 2 (TAT 6-24 HRS)  TROPONIN I (HIGH SENSITIVITY)    EKG None  Radiology DG  Chest Portable 1 View  Result Date: 03/02/2021 CLINICAL DATA:  75 year old male positive COVID-19.  Hypoxia. EXAM: PORTABLE CHEST 1 VIEW COMPARISON:  Portable chest 11/11/2020 and earlier. FINDINGS: Portable AP semi upright view at 0626 hours. Similar patient rotation to the left. Patchy retrocardiac left lung base opacity, although similar to January portable chest x-ray this year. No pneumothorax, pulmonary edema or pleural effusion. Mediastinal contours remain within normal limits. Visualized tracheal air column is within normal limits. No acute osseous abnormality identified. Paucity of bowel gas. IMPRESSION: 1. Positive for left lung base opacity although might be chronic atelectasis or scarring rather than pneumonia given similarity to a January radiograph. 2. No other acute cardiopulmonary abnormality. Electronically Signed   By: Genevie Ann M.D.   On: 03/02/2021 06:54    Procedures Procedures   Medications Ordered in ED Medications  azithromycin (ZITHROMAX) tablet 500 mg (has no administration in time range)    ED Course  I have reviewed the triage vital signs and the nursing notes.  Pertinent labs & imaging results that were available during my care of the patient were reviewed by me and considered in my medical decision making (see chart for details).    MDM Rules/Calculators/A&P                          No hyopxia. No tachypnea. Questionable opacity on xray, will initiate antibiotics. Stable for discharge on home O2.  Final Clinical Impression(s) / ED Diagnoses Final diagnoses:  Hypoxia    Rx / DC Orders ED Discharge Orders          Ordered    azithromycin (ZITHROMAX) 250 MG tablet  Daily        03/02/21 0735             Sheryl Towell, Corene Cornea, MD 03/02/21 [phone]

## 2021-03-02 NOTE — ED Triage Notes (Signed)
Per nursing facility pt's o2 has been fluctuating from 88-91% on 4lpm. Pt is baseline confused and has no complaints.

## 2021-03-02 NOTE — ED Notes (Signed)
Attempted report, no answer

## 2021-03-03 LAB — SARS CORONAVIRUS 2 (TAT 6-24 HRS): SARS Coronavirus 2: NEGATIVE

## 2021-03-09 ENCOUNTER — Other Ambulatory Visit: Payer: Self-pay

## 2021-03-09 ENCOUNTER — Ambulatory Visit: Payer: Medicare Other

## 2021-03-09 DIAGNOSIS — S76112D Strain of left quadriceps muscle, fascia and tendon, subsequent encounter: Secondary | ICD-10-CM

## 2021-03-09 DIAGNOSIS — R2681 Unsteadiness on feet: Secondary | ICD-10-CM

## 2021-03-09 DIAGNOSIS — N390 Urinary tract infection, site not specified: Secondary | ICD-10-CM | POA: Diagnosis not present

## 2021-03-09 DIAGNOSIS — M6281 Muscle weakness (generalized): Secondary | ICD-10-CM | POA: Diagnosis not present

## 2021-03-09 NOTE — Therapy (Signed)
[facility] [address] [place], [place], [address] Phone: [phone]   Fax:  [phone]  Physical Therapy Treatment  Patient Details  Name: Rick Cruz MRN: [id number] Date of Birth: Feb 02, 1946 Referring Provider (PT): Dr. 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    COLONOSCOPY  July 2011   Dr. Benson Norway: multiple polyps, internal and external hemorrhoids, diverticulosis. tubular adenoma   ESOPHAGOGASTRODUODENOSCOPY (EGD) WITH PROPOFOL N/A 04/20/2013   Procedure: ESOPHAGOGASTRODUODENOSCOPY (EGD) WITH PROPOFOL;  Surgeon: Daneil Dolin, MD;  Location: [facility];  Service: Endoscopy;  Laterality: N/A;   KNEE SURGERY     X2    There were no vitals filed for this visit.   Subjective Assessment - 03/09/21 1247     Subjective ED trip  due to hypoxia no asmission, demos a wet cough today but no expectorate    Patient is accompained by: --   CNA from facility   Pertinent History Referred 01/18/21 Mel[place] Nakayama, MD for left knee quad tear/ history of CVA  PMH: arthritis, blind right eye due to CVA, CHF, chronic pain, COPD, depression, DM, HTN, 3L home O2, schizoprenia, sleep apnea, right pontine infarct 09/2020    Per Que at Dr. Jerald Kief pt has no restrictions  Resides at [facility].    Limitations Standing;Walking    How long can you sit comfortably? unlimited    How long can you stand comfortably? <5 min    How long can you walk comfortably? 79f                               [facility] Adult PT Treatment/Exercise - 03/09/21 0001       Transfers   Transfers Sit to Stand    Sit to Stand 2: Max assist    Number of Reps Other reps (comment)   3 attempts, no use of RLE noted   Comments unable to stand or arise from chair today  Knee/Hip Exercises: Seated   Long Arc Quad Right;1 set;15 reps    Marching Strengthening;1 set;15 reps    Hamstring Curl Right;1 set;15 reps;Other (comment)    Hamstring Limitations towel to slide      Ankle Exercises: Seated   Heel Raises Right;15 reps    Toe Raise 15 reps            Assessed lung sounds and O2 sats, abnormal lung sounds R lower quadrant, O2 sats were WFL throughout PT session          PT Short Term Goals - 03/01/21 1750       PT SHORT TERM GOAL #1   Title Patient to transfer sit/stand with SBA    Baseline PAtient able to tarnsfersit/stand with MinA/CGA    Time 2    Period Weeks    Status New    Target Date 03/15/21      PT SHORT TERM GOAL #2   Title Patient to ambulate 10f with RW and Min A with appropriate LLE brace    Baseline Unable to ambulate safely due to need of appropriate L knee barce    Time 2    Period Weeks    Status New    Target Date 03/15/21      PT SHORT TERM GOAL #3   Title Develop HEP for facility staff to  perform with patient    Baseline TBD    Time 2    Period Weeks    Status New    Target Date 03/15/21               PT Long Term Goals - 03/01/21 1755       PT LONG TERM GOAL #1   Title Patient to perform sit/stand transfers with S    Baseline CGA/MinA to transfer with L knee immobilizer    Time 4    Period Weeks    Status New    Target Date 03/29/21      PT LONG TERM GOAL #2   Title Patient to ambulate 531fwith RW and appropriate brace with CGA    Baseline UTA due to improper brace    Time 4    Period Weeks    Status New    Target Date 03/29/21      PT LONG TERM GOAL #3   Title Obtain appropriate brace for LLE to assist in maintaining knee extension    Baseline TBD    Time 4    Period Weeks    Status New    Target Date 03/29/21                   Plan - 03/09/21 1313     Clinical Impression Statement Todays treatment centered around transfers, patient unable to stand following several attempts, wet cough noted, auscultation revealed abnormal lung sounds in R lower lobe, worse with exertion.  Remainder of session focused on RLE strengthening in seated position.  CNA present but she was not familiar with patient and unable to answer questions or participate in HEP as she does not care for patient.    Personal Factors and Comorbidities Age;Comorbidity 3+    Comorbidities CVA, R blindness, COPD, depression, schizoprenia    Examination-Activity Limitations Stand;Stairs;Locomotion Level;Transfers    Stability/Clinical Decision Making Evolving/Moderate complexity    Rehab Potential Fair    PT Frequency 2x / week    PT Duration 4 weeks    PT Treatment/Interventions ADLs/Self Care Home Management;Gait  training;Stair training;Functional mobility training;Therapeutic activities;Therapeutic exercise;Balance training;Neuromuscular re-education;Patient/family education;DME Instruction    PT Next Visit Plan Continue transfer training, L knee stretching, check brace,  ambulation as appropriate    PT Home Exercise Plan prolonged stretching    Consulted and Agree with Plan of Care Patient             Patient will benefit from skilled therapeutic intervention in order to improve the following deficits and impairments:  Abnormal gait, Decreased range of motion, Difficulty walking, Decreased endurance, Decreased activity tolerance, Decreased balance, Decreased mobility, Decreased strength  Visit Diagnosis: Muscle weakness (generalized)  Unsteadiness on feet  Rupture of left quadriceps muscle, subsequent encounter     Problem List Patient Active Problem List   Diagnosis Date Noted   UTI (urinary tract infection) 12/14/2020   CVA (cerebral vascular accident) ([place]) 10/12/2020   Ischemic cerebrovascular accident (CVA) ([place]) 10/11/2020   Hypoalbuminemia 10/11/2020   Class 1 obesity 10/11/2020   Iron deficiency anemia [id number]   Acute metabolic encephalopathy [id number]   DKA, type 2, not at goal [facility]) 11/03/2019   Heart failure ([place]) 02/02/2019   Hyperglycemia    Altered mental status 11/21/2018   Diabetic foot ([place]) 07/23/2018   Accidental fall 06/25/2018   Supplemental oxygen dependent 06/25/2018   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   Neck pain 01/09/2018   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   Diabetic neuropathy ([place]) 12/13/2017   Gastroesophageal reflux disease 12/13/2017   Insomnia 07/30/2017   Depression 07/19/2016   OSA (obstructive sleep apnea)    Chronic respiratory failure with hypercapnia ([place])    Undifferentiated schizophrenia ([place])    Uncontrolled type 2 diabetes mellitus with complication (HCC)    Anemia of chronic disease    Paroxysmal atrial fibrillation (HCC)    Hyponatremia 06/12/2016   Aspiration pneumonia ([place]) 08/16/2015   Seborrheic keratosis 08/04/2015   Pure hypercholesterolemia 07/05/2015   Obesity hypoventilation syndrome ([place]) 04/05/2015   CSA (central sleep apnea) 04/05/2015   Non compliance with medical  treatment 04/05/2015   Type 2 diabetes mellitus with hyperglycemia ([place]) 02/28/2015   Acute respiratory failure with hypoxia and hypercarbia (HCC)    Chronic diastolic CHF (congestive heart failure) ([place]) 08/06/2014   Recurrent falls 05/08/2014   Ankle fracture, right 05/08/2014   Ankle fracture, left 05/08/2014   C. difficile colitis 01/04/2014   Delirium 01/02/2014   Acute encephalopathy 01/02/2014   Morbid obesity ([place]) 06/17/2013   Erosive esophagitis 04/22/2013   Chronic respiratory failure 02 dep with hypercarbia 04/22/2013   Type 2 diabetes mellitus without complication, with long-term current use of insulin ([place]) 04/21/2013   Paranoid schizophrenia ([place]) 04/18/2013   CAD (coronary artery disease) 04/18/2013   Dyspnea on exertion 08/26/2008   DYSLIPIDEMIA 08/25/2008   Obstructive sleep apnea 08/25/2008   Essential hypertension 08/25/2008   Dyslipidemia 08/25/2008    Jacqulynn Cadet Kahmya Pinkham PT 03/09/2021, 1:30 PM  [facility] [address] [place], [place], [address] Phone: [phone]   Fax:  [phone]  Name: Rick Cruz MRN: [id number] Date of Birth: 07/03/1946

## 2021-03-14 ENCOUNTER — Ambulatory Visit: Payer: Medicare Other

## 2021-03-14 ENCOUNTER — Other Ambulatory Visit: Payer: Self-pay

## 2021-03-14 DIAGNOSIS — M6281 Muscle weakness (generalized): Secondary | ICD-10-CM | POA: Diagnosis not present

## 2021-03-14 DIAGNOSIS — S76112D Strain of left quadriceps muscle, fascia and tendon, subsequent encounter: Secondary | ICD-10-CM | POA: Diagnosis not present

## 2021-03-14 DIAGNOSIS — R2681 Unsteadiness on feet: Secondary | ICD-10-CM | POA: Diagnosis not present

## 2021-03-14 NOTE — Therapy (Signed)
[facility] [address] [place], [place], [address] Phone: [phone]   Fax:  [phone]  Physical Therapy Treatment  Patient Details  Name: Rick Cruz MRN: [id number] Date of Birth: 1946/01/12 Referring Provider (PT): Dr. 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    COLONOSCOPY  July 2011   Dr. Benson Norway: multiple polyps, internal and external hemorrhoids, diverticulosis. tubular adenoma   ESOPHAGOGASTRODUODENOSCOPY (EGD) WITH PROPOFOL N/A 04/20/2013   Procedure: ESOPHAGOGASTRODUODENOSCOPY (EGD) WITH PROPOFOL;  Surgeon: Daneil Dolin, MD;  Location: [facility];  Service: Endoscopy;  Laterality: N/A;   KNEE SURGERY     X2    There were no vitals filed for this visit.   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 Jerald Kief pt has no restrictions  Resides at [facility].    Limitations Standing;Walking    How long can you sit comfortably? unlimited    How long can you stand comfortably? <5 min    How long can you walk comfortably? 8f                               [facility] Adult PT Treatment/Exercise - 03/14/21 0001       Transfers   Transfers Sit to Stand    Sit to Stand 2: Max assist    Comments unable to stand or arise from chair today      Knee/Hip Exercises: Seated   Long Arc Quad Right;15 reps;2 sets    Marching Strengthening;15 reps;2 sets;Left  Hamstring Curl 2 sets;Left    Hamstring Limitations towel to slide      Ankle Exercises: Seated   Heel Raises Both;15 reps    Toe Raise 15 reps                      PT Short Term Goals - 03/14/21 1242       PT SHORT TERM GOAL #1   Title Patient to transfer sit/stand with SBA    Baseline PAtient able to tarnsfersit/stand with MinA/CGA    Time 2    Period Weeks    Status New    Target Date 03/15/21      PT SHORT TERM GOAL #2   Title Patient to ambulate 52f with RW and Min A with appropriate LLE brace    Baseline Unable to ambulate safely due to need of appropriate L knee barce    Time 2    Period Weeks    Status New    Target Date 03/15/21      PT SHORT TERM GOAL #3   Title Develop HEP for facility staff to perform with patient    Baseline TBD; 03/14/21 Per facility staff, only PT staff are allowed to work with patient at facility    Time 2    Period Weeks    Status New    Target Date 03/15/21               PT Long Term Goals -  03/01/21 1755       PT LONG TERM GOAL #1   Title Patient to perform sit/stand transfers with S    Baseline CGA/MinA to transfer with L knee immobilizer    Time 4    Period Weeks    Status New    Target Date 03/29/21      PT LONG TERM GOAL #2   Title Patient to ambulate 89fwith RW and appropriate brace with CGA    Baseline UTA due to improper brace    Time 4    Period Weeks    Status New    Target Date 03/29/21      PT LONG TERM GOAL #3   Title Obtain appropriate brace for LLE to assist in maintaining knee extension    Baseline TBD    Time 4    Period Weeks    Status New    Target Date 03/29/21                   Plan - 03/14/21 1255     Clinical Impression Statement Todays session focused on LLE strength and transfer training, patient showing minimal volitional movement in LLE and unable to participate in srengthening program effectively, cognition may be a factor today as patient shows delays in following commands.  Unable to attempt strengthening in LLE w/o marked assist from PT rendering PREs ineffective.  [facility] with patient requiring Max A to clear chair but unable to stand at all today.    Personal Factors and Comorbidities Age;Comorbidity 3+    Comorbidities CVA, R blindness, COPD, depression, schizoprenia    Examination-Activity Limitations Stand;Stairs;Locomotion Level;Transfers    Stability/Clinical Decision Making Evolving/Moderate complexity    Rehab Potential Fair    PT Frequency 2x / week    PT Duration 4 weeks    PT Treatment/Interventions ADLs/Self Care Home Management;Gait training;Stair training;Functional mobility training;Therapeutic activities;Therapeutic exercise;Balance training;Neuromuscular re-education;Patient/family education;DME Instruction    PT  Next Visit Plan Continue transfer training, L knee stretching, check on brace per MD note, ambulation as appropriate from // bars    PT Home Exercise  Plan prolonged stretching(unable to establish HEP as patient has cognitive deficits and unable to follow through on his own, facility staff limited in abiluty to assist patient as all activities need to go through PT staff at facility)    Consulted and Agree with Plan of Care Patient             Patient will benefit from skilled therapeutic intervention in order to improve the following deficits and impairments:  Abnormal gait, Decreased range of motion, Difficulty walking, Decreased endurance, Decreased activity tolerance, Decreased balance, Decreased mobility, Decreased strength  Visit Diagnosis: Muscle weakness (generalized)  Unsteadiness on feet  Rupture of left quadriceps muscle, subsequent encounter     Problem List Patient Active Problem List   Diagnosis Date Noted   UTI (urinary tract infection) 12/14/2020   CVA (cerebral vascular accident) ([place]) 10/12/2020   Ischemic cerebrovascular accident (CVA) ([place]) 10/11/2020   Hypoalbuminemia 10/11/2020   Class 1 obesity 10/11/2020   Iron deficiency anemia [id number]   Acute metabolic encephalopathy [id number]   DKA, type 2, not at goal [facility]) 11/03/2019   Heart failure ([place]) 02/02/2019   Hyperglycemia    Altered mental status 11/21/2018   Diabetic foot ([place]) 07/23/2018   Accidental fall 06/25/2018   Supplemental oxygen dependent 06/25/2018   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   Neck pain 01/09/2018   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   Diabetic neuropathy ([place]) 12/13/2017   Gastroesophageal reflux disease 12/13/2017   Insomnia 07/30/2017   Depression 07/19/2016   OSA (obstructive sleep apnea)    Chronic respiratory failure with hypercapnia ([place])    Undifferentiated schizophrenia ([place])    Uncontrolled type 2 diabetes mellitus with complication (HCC)    Anemia of chronic disease    Paroxysmal atrial fibrillation (HCC)    Hyponatremia 06/12/2016   Aspiration pneumonia ([place]) 08/16/2015   Seborrheic keratosis 08/04/2015    Pure hypercholesterolemia 07/05/2015   Obesity hypoventilation syndrome ([place]) 04/05/2015   CSA (central sleep apnea) 04/05/2015   Non compliance with medical treatment 04/05/2015   Type 2 diabetes mellitus with hyperglycemia ([place]) 02/28/2015   Acute respiratory failure with hypoxia and hypercarbia (HCC)    Chronic diastolic CHF (congestive heart failure) ([place]) 08/06/2014   Recurrent falls 05/08/2014   Ankle fracture, right 05/08/2014   Ankle fracture, left 05/08/2014   C. difficile colitis 01/04/2014   Delirium 01/02/2014   Acute encephalopathy 01/02/2014   Morbid obesity ([place]) 06/17/2013   Erosive esophagitis 04/22/2013   Chronic respiratory failure 02 dep with hypercarbia 04/22/2013   Type 2 diabetes mellitus without complication, with long-term current use of insulin ([place]) 04/21/2013   Paranoid schizophrenia ([place]) 04/18/2013   CAD (coronary artery disease) 04/18/2013   Dyspnea on exertion 08/26/2008   DYSLIPIDEMIA 08/25/2008   Obstructive sleep apnea 08/25/2008   Essential hypertension 08/25/2008   Dyslipidemia 08/25/2008    Lanice Shirts PT 03/14/2021, 1:12 PM  [facility] [address] [place], [place], [address] Phone: [phone]   Fax:  [phone]  Name: Rick Cruz MRN: [id number] Date of Birth: 17-Mar-1946

## 2021-03-16 ENCOUNTER — Telehealth (HOSPITAL_COMMUNITY): Payer: Self-pay | Admitting: Physical Therapy

## 2021-03-16 ENCOUNTER — Other Ambulatory Visit: Payer: Self-pay

## 2021-03-16 ENCOUNTER — Ambulatory Visit: Payer: Medicare Other

## 2021-03-16 DIAGNOSIS — M6281 Muscle weakness (generalized): Secondary | ICD-10-CM | POA: Diagnosis not present

## 2021-03-16 DIAGNOSIS — R2681 Unsteadiness on feet: Secondary | ICD-10-CM

## 2021-03-16 DIAGNOSIS — S76112D Strain of left quadriceps muscle, fascia and tendon, subsequent encounter: Secondary | ICD-10-CM

## 2021-03-16 NOTE — Telephone Encounter (Signed)
error 

## 2021-03-16 NOTE — Therapy (Signed)
[facility] [address] [place], [place], [address] Phone: [phone]   Fax:  [phone]  Physical Therapy Treatment  Patient Details  Name: Rick Cruz MRN: [id number] Date of Birth: 03/08/1946 Referring Provider (PT): Dr. 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    COLONOSCOPY  July 2011   Dr. Benson Norway: multiple polyps, internal and external hemorrhoids, diverticulosis. tubular adenoma   ESOPHAGOGASTRODUODENOSCOPY (EGD) WITH PROPOFOL N/A 04/20/2013   Procedure: ESOPHAGOGASTRODUODENOSCOPY (EGD) WITH PROPOFOL;  Surgeon: Daneil Dolin, MD;  Location: [facility];  Service: Endoscopy;  Laterality: N/A;   KNEE SURGERY     X2    There were no vitals filed for this visit.   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 Jerald Kief pt has no restrictions  Resides at [facility].    Limitations Standing;Walking    How long can you sit comfortably? unlimited    How long can you stand comfortably? <5 min    How long can you walk comfortably? 56f                               [facility] Adult PT Treatment/Exercise - 03/16/21 0001       Transfers   Transfers Sit to Stand    Sit to Stand 2: Max assist    Comments multiple attmpts to stand from chair over 2 minute time period, patient requires max assist but unable to successfully stand as he did not engage his RLE in transfer attempts.  Frequent cuing and assist needed to wear O2      Knee/Hip Exercises: Seated   Long Arc Quad Right;15 reps;2 sets    Marching Strengthening;15 reps;2 sets;Right    Hamstring Curl 2 sets;Right      Ankle Exercises: Seated   Heel Raises Both;15 reps    Toe Raise 15 reps;Limitations   2 sets of 15                     PT Short Term Goals - 03/14/21 1242       PT SHORT TERM GOAL #1   Title Patient to transfer sit/stand with SBA    Baseline PAtient able to tarnsfersit/stand with MinA/CGA    Time 2    Period Weeks    Status New    Target Date 03/15/21      PT SHORT TERM GOAL #2   Title Patient to ambulate 18f with RW and Min A with appropriate LLE brace    Baseline Unable to ambulate safely due to need of appropriate L knee barce    Time 2    Period Weeks    Status New    Target Date 03/15/21      PT SHORT TERM GOAL #3   Title Develop HEP for facility staff to perform with patient    Baseline TBD; 03/14/21 Per  facility staff, only PT staff are allowed to work with patient at facility    Time 2    Period Weeks    Status New    Target Date 03/15/21               PT Long Term Goals - 03/01/21 1755       PT LONG TERM GOAL #1   Title Patient to perform sit/stand transfers with S    Baseline CGA/MinA to transfer with L knee immobilizer    Time 4    Period Weeks    Status New    Target Date 03/29/21      PT LONG TERM GOAL #2   Title Patient to ambulate 89fwith RW and appropriate brace with CGA    Baseline UTA due to improper brace    Time 4    Period Weeks    Status New    Target Date 03/29/21      PT LONG TERM GOAL #3   Title Obtain appropriate brace for LLE to assist in maintaining knee extension    Baseline TBD    Time 4    Period Weeks    Status New    Target Date 03/29/21                   Plan - 03/16/21 1501     Clinical Impression Statement Todays session focused on RLE strengthening, LLE unable unable to participate due to long standing atrophy.  Due to cog deficits, follow through with commands is delayed and frequent cuing correction needed.  patient more confused today as he resisted wearing is mask and O2 requiring frequent reminders by PT as well as assist to donn mask/nasal cannula.  Continues to struggle with STS and puts out little effort as noted by no RLE muscle activation    Personal Factors and Comorbidities Age;Comorbidity 3+    Comorbidities CVA, R blindness, COPD, depression, schizoprenia    Examination-Activity Limitations Stand;Stairs;Locomotion Level;Transfers    Stability/Clinical Decision Making Evolving/Moderate complexity    Rehab Potential Fair    PT Frequency 2x /  week    PT Duration 4 weeks    PT Treatment/Interventions ADLs/Self Care Home Management;Gait training;Stair training;Functional mobility training;Therapeutic activities;Therapeutic exercise;Balance training;Neuromuscular re-education;Patient/family education;DME Instruction     PT Next Visit Plan Continue transfer training, L knee stretching, f/u on brace referral, ambulation as appropriate from // bars    PT Home Exercise Plan prolonged stretching(unable to establish HEP as patient has cognitive deficits and unable to follow through on his own, facility staff limited in abiluty to assist patient as all activities need to go through PT staff at facility)    Recommended Other Services patient may transfer to facility closer to home    Consulted and Agree with Plan of Care Patient             Patient will benefit from skilled therapeutic intervention in order to improve the following deficits and impairments:  Abnormal gait, Decreased range of motion, Difficulty walking, Decreased endurance, Decreased activity tolerance, Decreased balance, Decreased mobility, Decreased strength  Visit Diagnosis: Muscle weakness (generalized)  Rupture of left quadriceps muscle, subsequent encounter  Unsteadiness on feet     Problem List Patient Active Problem List   Diagnosis Date Noted   UTI (urinary tract infection) 12/14/2020   CVA (cerebral vascular accident) ([place]) 10/12/2020   Ischemic cerebrovascular accident (CVA) ([place]) 10/11/2020   Hypoalbuminemia 10/11/2020   Class 1 obesity 10/11/2020   Iron deficiency anemia [id number]   Acute metabolic encephalopathy [id number]   DKA, type 2, not at goal [facility]) 11/03/2019   Heart failure ([place]) 02/02/2019   Hyperglycemia    Altered mental status 11/21/2018   Diabetic foot ([place]) 07/23/2018   Accidental fall 06/25/2018   Supplemental oxygen dependent 06/25/2018   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   Neck pain 01/09/2018   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   Diabetic neuropathy ([place]) 12/13/2017   Gastroesophageal reflux disease 12/13/2017   Insomnia 07/30/2017   Depression 07/19/2016   OSA (obstructive sleep apnea)    Chronic respiratory failure with hypercapnia ([place])    Undifferentiated schizophrenia ([place])     Uncontrolled type 2 diabetes mellitus with complication (HCC)    Anemia of chronic disease    Paroxysmal atrial fibrillation (HCC)    Hyponatremia 06/12/2016   Aspiration pneumonia ([place]) 08/16/2015   Seborrheic keratosis 08/04/2015   Pure hypercholesterolemia 07/05/2015   Obesity hypoventilation syndrome ([place]) 04/05/2015   CSA (central sleep apnea) 04/05/2015   Non compliance with medical treatment 04/05/2015   Type 2 diabetes mellitus with hyperglycemia ([place]) 02/28/2015   Acute respiratory failure with hypoxia and hypercarbia (HCC)    Chronic diastolic CHF (congestive heart failure) ([place]) 08/06/2014   Recurrent falls 05/08/2014   Ankle fracture, right 05/08/2014   Ankle fracture, left 05/08/2014   C. difficile colitis 01/04/2014   Delirium 01/02/2014   Acute encephalopathy 01/02/2014   Morbid obesity ([place]) 06/17/2013   Erosive esophagitis 04/22/2013   Chronic respiratory failure 02 dep with hypercarbia 04/22/2013   Type 2 diabetes mellitus without complication, with long-term current use of insulin ([place]) 04/21/2013   Paranoid schizophrenia ([place]) 04/18/2013   CAD (coronary artery disease) 04/18/2013   Dyspnea on exertion 08/26/2008   DYSLIPIDEMIA 08/25/2008   Obstructive sleep apnea 08/25/2008   Essential hypertension 08/25/2008   Dyslipidemia 08/25/2008    Lanice Shirts PT 03/16/2021, 3:25 PM  [facility] [address] [place], [place], [address] Phone: [phone]   Fax:  [phone]  Name: Rick Cruz MRN: [id number] Date of Birth: 08/04/1946

## 2021-03-16 NOTE — Telephone Encounter (Signed)
S/w LaKita at [facility] requested pt be transfered to our office due to [facility] is transporting him and we would be closure. She will fax the last progress note to [place] on 03/24/21.

## 2021-03-21 DIAGNOSIS — G9341 Metabolic encephalopathy: Secondary | ICD-10-CM | POA: Diagnosis not present

## 2021-03-22 ENCOUNTER — Ambulatory Visit: Payer: Medicare Other | Attending: Orthopaedic Surgery

## 2021-03-22 ENCOUNTER — Ambulatory Visit: Payer: Medicare Other

## 2021-03-22 ENCOUNTER — Other Ambulatory Visit: Payer: Self-pay

## 2021-03-22 DIAGNOSIS — M6281 Muscle weakness (generalized): Secondary | ICD-10-CM | POA: Insufficient documentation

## 2021-03-22 DIAGNOSIS — R2681 Unsteadiness on feet: Secondary | ICD-10-CM | POA: Insufficient documentation

## 2021-03-22 DIAGNOSIS — N184 Chronic kidney disease, stage 4 (severe): Secondary | ICD-10-CM | POA: Diagnosis not present

## 2021-03-22 DIAGNOSIS — S76112D Strain of left quadriceps muscle, fascia and tendon, subsequent encounter: Secondary | ICD-10-CM | POA: Insufficient documentation

## 2021-03-22 DIAGNOSIS — D649 Anemia, unspecified: Secondary | ICD-10-CM | POA: Diagnosis not present

## 2021-03-22 DIAGNOSIS — N390 Urinary tract infection, site not specified: Secondary | ICD-10-CM | POA: Diagnosis not present

## 2021-03-22 NOTE — Therapy (Addendum)
[facility] [address] [place], [place], [address] Phone: [phone]   Fax:  [phone]  Physical Therapy Treatment/No charge/DC Summary  Patient Details  Name: Rick Cruz MRN: [id number] Date of Birth: 1946/09/07 Referring Provider (PT): Dr. Rhona Raider   Encounter Date: 03/22/2021 PHYSICAL THERAPY DISCHARGE SUMMARY  Visits from Start of Care: 5  Current functional level related to goals / functional outcomes: UTA   Remaining deficits: UTA   Education / Equipment: HEP   Patient agrees to discharge. Patient goals were not met. Patient is being discharged due to not returning since the last visit.   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    COLONOSCOPY  July 2011   Dr. Benson Norway: multiple polyps, internal and external hemorrhoids, diverticulosis. tubular adenoma    ESOPHAGOGASTRODUODENOSCOPY (EGD) WITH PROPOFOL N/A 04/20/2013   Procedure: ESOPHAGOGASTRODUODENOSCOPY (EGD) WITH PROPOFOL;  Surgeon: Daneil Dolin, MD;  Location: [facility];  Service: Endoscopy;  Laterality: N/A;   KNEE SURGERY     X2    There were no vitals filed for this visit.   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 Jerald Kief pt has no restrictions  Resides at [facility].    Limitations Standing;Walking    How long can you sit comfortably? unlimited    How long can you stand comfortably? <5 min    How long can you walk comfortably? 66f             Patient arrived for treatment w/o L knee immobilizer, he was awake and alert but did not respond to questions or comply with exercise activity instructions, attempted to  engage patient several times over 20 minute period but could not elicit any voluntary activity from patient.                            PT Short Term Goals - 03/22/21 1445       PT SHORT TERM GOAL #1   Title Patient to transfer sit/stand with SBA    Baseline PAtient able to tarnsfersit/stand with MinA/CGA    Time 2    Period Weeks    Status New    Target Date 03/15/21      PT SHORT TERM GOAL #2   Title Patient to ambulate 71f with RW and Min A with appropriate LLE brace    Baseline Unable to ambulate safely due to need of appropriate L knee barce    Time 2    Period Weeks    Status New    Target Date 03/15/21      PT SHORT TERM GOAL #3   Title Develop HEP for facility staff to perform with patient    Baseline TBD; 03/14/21 Per facility staff, only  PT staff are allowed to work with patient at facility    Time 2    Period Weeks    Status New    Target Date 03/15/21               PT Long Term Goals - 03/01/21 1755       PT LONG TERM GOAL #1   Title Patient to perform sit/stand transfers with S    Baseline CGA/MinA to transfer with L knee immobilizer    Time 4    Period Weeks    Status New    Target Date 03/29/21      PT LONG TERM GOAL #2   Title Patient to ambulate 581fwith RW and appropriate brace with CGA    Baseline UTA due to improper brace    Time 4    Period Weeks    Status New    Target Date 03/29/21      PT LONG TERM GOAL #3   Title Obtain appropriate brace for LLE to assist in maintaining knee extension    Baseline TBD    Time 4    Period Weeks    Status New    Target Date 03/29/21                   Plan - 03/22/21 1455     Clinical Impression Statement patient arrived for scheduled visit w/o L immobilizer, he was awake but did not answer questions, attempted to engage patient in exercise and activity but he did not assist with volitional movements. when asked to perform activities he refused to comply.  No chatges rendered as patient did not perfrom any skilled tasks    Personal Factors and Comorbidities Age;Comorbidity 3+    Comorbidities CVA, R blindness, COPD, depression, schizoprenia    Examination-Activity Limitations Stand;Stairs;Locomotion Level;Transfers    Stability/Clinical Decision Making Evolving/Moderate complexity    Rehab Potential Fair    PT Frequency 2x / week    PT Duration 4 weeks    PT Treatment/Interventions ADLs/Self Care Home Management;Gait training;Stair training;Functional mobility training;Therapeutic activities;Therapeutic exercise;Balance training;Neuromuscular re-education;Patient/family education;DME Instruction    PT Next Visit Plan Continue transfer training, L knee stretching, f/u on brace referral, ambulation as appropriate from // bars, attempt to elicit  RLE contraction/activation to allow better participation in transfer training  PT Home Exercise Plan prolonged stretching(unable to establish HEP as patient has cognitive deficits and unable to follow through on his own, facility staff limited in abiluty to assist patient as all activities need to go through PT staff at facility)    Consulted and Agree with Plan of Care Patient             Patient will benefit from skilled therapeutic intervention in order to improve the following deficits and impairments:  Abnormal gait, Decreased range of motion, Difficulty walking, Decreased endurance, Decreased activity tolerance, Decreased balance, Decreased mobility, Decreased strength  Visit Diagnosis: Muscle weakness (generalized)  Rupture of left quadriceps muscle, subsequent encounter  Unsteadiness on feet     Problem List Patient Active Problem List   Diagnosis Date Noted   UTI (urinary tract infection) 12/14/2020   CVA (cerebral vascular accident) ([place]) 10/12/2020   Ischemic cerebrovascular accident (CVA) ([place]) 10/11/2020   Hypoalbuminemia 10/11/2020   Class 1 obesity 10/11/2020   Iron deficiency anemia 40/04/6760   Acute metabolic encephalopathy 95/05/3266   DKA, type 2, not at goal [facility]) 11/03/2019   Heart failure ([place]) 02/02/2019   Hyperglycemia    Altered mental status 11/21/2018   Diabetic foot ([place]) 07/23/2018   Accidental fall 06/25/2018   Supplemental oxygen dependent 06/25/2018   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   Neck pain 01/09/2018   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   Diabetic neuropathy ([place]) 12/13/2017   Gastroesophageal reflux disease 12/13/2017   Insomnia 07/30/2017   Depression 07/19/2016   OSA (obstructive sleep apnea)    Chronic respiratory failure with hypercapnia ([place])    Undifferentiated schizophrenia ([place])    Uncontrolled type 2 diabetes mellitus with complication (HCC)    Anemia of chronic disease    Paroxysmal atrial fibrillation (HCC)     Hyponatremia 06/12/2016   Aspiration pneumonia ([place]) 08/16/2015   Seborrheic keratosis 08/04/2015   Pure hypercholesterolemia 07/05/2015   Obesity hypoventilation syndrome ([place]) 04/05/2015   CSA (central sleep apnea) 04/05/2015   Non compliance with medical treatment 04/05/2015   Type 2 diabetes mellitus with hyperglycemia ([place]) 02/28/2015   Acute respiratory failure with hypoxia and hypercarbia (HCC)    Chronic diastolic CHF (congestive heart failure) ([place]) 08/06/2014   Recurrent falls 05/08/2014   Ankle fracture, right 05/08/2014   Ankle fracture, left 05/08/2014   C. difficile colitis 01/04/2014   Delirium 01/02/2014   Acute encephalopathy 01/02/2014   Morbid obesity ([place]) 06/17/2013   Erosive esophagitis 04/22/2013   Chronic respiratory failure 02 dep with hypercarbia 04/22/2013   Type 2 diabetes mellitus without complication, with long-term current use of insulin ([place]) 04/21/2013   Paranoid schizophrenia ([place]) 04/18/2013   CAD (coronary artery disease) 04/18/2013   Dyspnea on exertion 08/26/2008   DYSLIPIDEMIA 08/25/2008   Obstructive sleep apnea 08/25/2008   Essential hypertension 08/25/2008   Dyslipidemia 08/25/2008    Lanice Shirts PT 03/22/2021, 3:00 PM  [facility] [address] [place], [place], [address] Phone: [phone]   Fax:  [phone]  Name: Rick Cruz MRN: [id number] Date of Birth: 07/01/1946

## 2021-03-24 ENCOUNTER — Ambulatory Visit: Payer: Medicare Other

## 2021-03-27 DIAGNOSIS — N3000 Acute cystitis without hematuria: Secondary | ICD-10-CM | POA: Diagnosis not present

## 2021-03-27 DIAGNOSIS — R52 Pain, unspecified: Secondary | ICD-10-CM | POA: Diagnosis not present

## 2021-03-27 DIAGNOSIS — I10 Essential (primary) hypertension: Secondary | ICD-10-CM | POA: Diagnosis not present

## 2021-03-27 DIAGNOSIS — I639 Cerebral infarction, unspecified: Secondary | ICD-10-CM | POA: Diagnosis not present

## 2021-03-27 DIAGNOSIS — E785 Hyperlipidemia, unspecified: Secondary | ICD-10-CM | POA: Diagnosis not present

## 2021-03-27 DIAGNOSIS — G629 Polyneuropathy, unspecified: Secondary | ICD-10-CM | POA: Diagnosis not present

## 2021-03-27 DIAGNOSIS — I480 Paroxysmal atrial fibrillation: Secondary | ICD-10-CM | POA: Diagnosis not present

## 2021-03-27 DIAGNOSIS — E1169 Type 2 diabetes mellitus with other specified complication: Secondary | ICD-10-CM | POA: Diagnosis not present

## 2021-03-28 ENCOUNTER — Ambulatory Visit: Payer: Medicare Other

## 2021-03-28 ENCOUNTER — Telehealth (HOSPITAL_COMMUNITY): Payer: Self-pay | Admitting: Physical Therapy

## 2021-03-28 ENCOUNTER — Ambulatory Visit (HOSPITAL_COMMUNITY): Payer: Medicare Other | Attending: Orthopaedic Surgery | Admitting: Physical Therapy

## 2021-03-28 ENCOUNTER — Other Ambulatory Visit: Payer: Self-pay

## 2021-03-28 DIAGNOSIS — S76112D Strain of left quadriceps muscle, fascia and tendon, subsequent encounter: Secondary | ICD-10-CM | POA: Diagnosis not present

## 2021-03-28 DIAGNOSIS — M6281 Muscle weakness (generalized): Secondary | ICD-10-CM | POA: Insufficient documentation

## 2021-03-28 DIAGNOSIS — R2681 Unsteadiness on feet: Secondary | ICD-10-CM | POA: Insufficient documentation

## 2021-03-28 NOTE — Therapy (Signed)
[place], [place], [address] Phone: [phone]   Fax:  [phone]  Physical Therapy Treatment  Patient Details  Name: Rick Cruz MRN: [id number] Date of Birth: December 18, 1945 Referring Provider (PT): Dr. 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    COLONOSCOPY  July 2011   Dr. Benson Norway: multiple polyps, internal and external hemorrhoids, diverticulosis. tubular adenoma   ESOPHAGOGASTRODUODENOSCOPY (EGD) WITH PROPOFOL N/A 04/20/2013   Procedure: ESOPHAGOGASTRODUODENOSCOPY (EGD) WITH PROPOFOL;  Surgeon: Daneil Dolin, MD;  Location: [facility];  Service: Endoscopy;  Laterality: N/A;   KNEE SURGERY     X2    There were no vitals filed for this visit.   Subjective Assessment - 03/28/21 1604     Subjective PT states that his knee is  sore.  He comes with a knee immobilzer on his Lt LE however it is old and worn and the velcro.    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 Jerald Kief pt has no restrictions  Resides at [facility].                               [facility] Adult PT Treatment/Exercise - 03/28/21 0001       Transfers   Transfers Sit to Stand    Sit to Stand 3: Mod assist    Comments stood x 10-15 seconds and needed to sit down            Sit to stand x 3;  Heels slides x 5;  Assisted SLR Therapist adjusted brace several times but  velcro Comes off .            PT Short Term Goals - 03/22/21 1445  PT SHORT TERM GOAL #1   Title Patient to transfer sit/stand with SBA    Baseline PAtient able to tarnsfersit/stand with MinA/CGA    Time 2    Period Weeks    Status New    Target Date 03/15/21      PT SHORT TERM GOAL #2   Title Patient to ambulate 27f with RW and Min A with appropriate LLE brace    Baseline Unable to ambulate safely due to need of appropriate L knee barce    Time 2    Period Weeks    Status New    Target Date 03/15/21      PT SHORT TERM GOAL #3   Title Develop HEP for facility staff to perform with patient    Baseline TBD; 03/14/21 Per facility staff, only PT staff are allowed to work with patient at facility    Time 2    Period Weeks    Status New    Target Date 03/15/21               PT Long Term Goals - 03/01/21 1755       PT LONG TERM GOAL #1   Title Patient to perform sit/stand transfers with S    Baseline CGA/MinA to transfer with L knee immobilizer    Time 4    Period Weeks    Status New    Target Date 03/29/21      PT LONG TERM GOAL #2   Title Patient to ambulate 548fwith RW and appropriate  brace with CGA    Baseline UTA due to improper brace    Time 4    Period Weeks    Status New    Target Date 03/29/21      PT LONG TERM GOAL #3   Title Obtain appropriate brace for LLE to assist in maintaining knee extension    Baseline TBD    Time 4    Period Weeks    Status New    Target Date 03/29/21                   Plan - 03/28/21 1731     Clinical Impression Statement PT has been transferred from [facility] rehab to this facility as [facility] and this facility is closer.  The therapist is attempting to understand [facility].  I spoke to pt wife who state that Dr. DaMatt Holmestated [facility]  Therapist has sent Dr. DaLatanya Maudlin message about this situation.  Chart review shows that Rick Cruz had bouts of confusion where he has gone to the ER for years.  He was ambulating at least 100 ft with a rolling waler on 02/22/2020 per PT notes.  [facility] R[facility] thalamus CV[facility], he fell and injured his knee at this time.  He has not been ambulatory more than a few feet since this time.  He fell again in Feb. and then had a UTI.  The therapist was working with sit to stand activity when she noted that the pt O2 was in the red, pt requires 3 L of O2 at all times.  This facility does not have O2 therefore session was cut short and therapist explained to make sure that O2 tank is full prior to coming to therapy.  Therapist has contacted MD but no reply as far as does pt still need brace, can he have therapy at [facility]    Personal Factors and Comorbidities Age;Comorbidity 3+    Comorbidities CVA, R blindness, COPD, depression, schizoprenia    Examination-Activity Limitations Stand;Stairs;Locomotion Level;Transfers    Stability/Clinical Decision Making Evolving/Moderate complexity    Rehab Potential Fair    PT Frequency 2x / week    PT Duration 4  weeks    PT Treatment/Interventions ADLs/Self Care Home Management;Gait training;Stair training;Functional mobility training;Therapeutic activities;Therapeutic exercise;Balance training;Neuromuscular re-education;Patient/family education;DME Instruction    PT Next Visit Plan Follow up if Dr. Latanya Maudlin requires therapy here of if pt can be seen at facility and if brace is needed. Continue transfer training, L knee stretching, f/u on brace referral, ambulation as appropriate from // bars, attempt to elicit RLE contraction/activation to allow better participation in transfer training    PT [place] prolonged stretching(unable to establish HEP as patient has cognitive deficits and unable to follow through on his own, facility staff limited in abiluty to assist patient as all activities need to go through PT staff at facility)    Consulted and Agree with Plan of Care Patient             Patient will benefit from skilled therapeutic intervention in order to improve the following deficits and impairments:  Abnormal gait, Decreased range of motion, Difficulty walking, Decreased endurance, Decreased activity tolerance, Decreased balance, Decreased mobility, Decreased strength  Visit Diagnosis: Muscle weakness (generalized)  Rupture of left quadriceps muscle, subsequent encounter  Unsteadiness on feet     Problem List Patient Active Problem List   Diagnosis Date Noted   UTI (urinary tract infection) 12/14/2020   CVA (cerebral vascular accident) ([place]) 10/12/2020   Ischemic cerebrovascular accident (CVA) ([place]) 10/11/2020   Hypoalbuminemia 10/11/2020   Class 1 obesity 10/11/2020   Iron deficiency anemia [id number]   Acute metabolic encephalopathy [id number]   DKA, type 2, not at goal [facility] 11/03/2019   Heart failure ([place]) 02/02/2019   Hyperglycemia    Altered mental status 11/21/2018   Diabetic foot ([place]) 07/23/2018   Accidental fall 06/25/2018   Supplemental oxygen dependent  06/25/2018   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   Neck pain 01/09/2018   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   Diabetic neuropathy ([place]) 12/13/2017   Gastroesophageal reflux disease 12/13/2017   Insomnia 07/30/2017   Depression 07/19/2016   OSA (obstructive sleep apnea)    Chronic respiratory failure with hypercapnia ([place])    Undifferentiated schizophrenia ([place])    Uncontrolled type 2 diabetes mellitus with complication (HCC)    Anemia of chronic disease    Paroxysmal atrial fibrillation (HCC)    Hyponatremia 06/12/2016   Aspiration pneumonia ([place]) 08/16/2015   Seborrheic keratosis 08/04/2015   Pure hypercholesterolemia 07/05/2015   Obesity hypoventilation syndrome ([place]) 04/05/2015   CSA (central sleep apnea) 04/05/2015   Non compliance with medical treatment 04/05/2015   Type 2 diabetes mellitus with hyperglycemia ([place]) 02/28/2015   Acute respiratory failure with hypoxia and hypercarbia (HCC)    Chronic diastolic CHF (congestive heart failure) ([place]) 08/06/2014   Recurrent falls 05/08/2014   Ankle fracture, right 05/08/2014   Ankle fracture, left 05/08/2014   C. difficile colitis 01/04/2014   Delirium 01/02/2014   Acute encephalopathy 01/02/2014   Morbid obesity ([place]) 06/17/2013   Erosive esophagitis 04/22/2013   Chronic respiratory failure 02 dep with hypercarbia 04/22/2013   Type 2 diabetes mellitus without complication, with long-term current use  of insulin ([place]) 04/21/2013   Paranoid schizophrenia ([place]) 04/18/2013   CAD (coronary artery disease) 04/18/2013   Dyspnea on exertion 08/26/2008   DYSLIPIDEMIA 08/25/2008   Obstructive sleep apnea 08/25/2008   Essential hypertension 08/25/2008   Dyslipidemia 08/25/2008   Rayetta Humphrey, PT CLT [phone]  03/28/2021, 5:40 PM  [facility] [address] [place], [place], [address] Phone: [phone]   Fax:  [phone]  Name: Rick Cruz MRN: [id number] Date of Birth:  [birth year]

## 2021-03-28 NOTE — Telephone Encounter (Signed)
Therapist called [facility] re pt is a resident at their facility and tried to find out why the pt would not be receiving therapy at the facility that he resides at.  Nursing staff did not know, called rehab and there was no answer therefore left a message for them to call back.  Called number given for spouse with this being an invalid number.   Rayetta Humphrey, [place] CLT [phone]

## 2021-03-30 ENCOUNTER — Ambulatory Visit: Payer: Medicare Other

## 2021-03-30 DIAGNOSIS — M6281 Muscle weakness (generalized): Secondary | ICD-10-CM | POA: Diagnosis not present

## 2021-03-30 DIAGNOSIS — R2689 Other abnormalities of gait and mobility: Secondary | ICD-10-CM | POA: Diagnosis not present

## 2021-03-30 DIAGNOSIS — I480 Paroxysmal atrial fibrillation: Secondary | ICD-10-CM | POA: Diagnosis not present

## 2021-03-31 ENCOUNTER — Ambulatory Visit (HOSPITAL_COMMUNITY): Payer: Medicare Other | Admitting: Physical Therapy

## 2021-03-31 DIAGNOSIS — I480 Paroxysmal atrial fibrillation: Secondary | ICD-10-CM | POA: Diagnosis not present

## 2021-03-31 DIAGNOSIS — R2689 Other abnormalities of gait and mobility: Secondary | ICD-10-CM | POA: Diagnosis not present

## 2021-03-31 DIAGNOSIS — M6281 Muscle weakness (generalized): Secondary | ICD-10-CM | POA: Diagnosis not present

## 2021-04-03 ENCOUNTER — Encounter (HOSPITAL_COMMUNITY): Payer: Self-pay | Admitting: Physical Therapy

## 2021-04-03 NOTE — Therapy (Signed)
[place], [place], [address] Phone: [phone]   Fax:  [phone]  Patient Details  Name: Rick Cruz MRN: [id number] Date of Birth: 1946/02/06 Referring Provider:  No ref. provider found  Encounter Date: 04/03/2021 Response from Dr. Latanya Maudlin re [name] care. Melrose Nakayama, MD  Leeroy Cha, PT [place] WBAT.  No quad injury.  Brace that gives a little ROM probably better. PD         Previous Messages    ----- Message -----  From: Leeroy Cha, PT  Sent: 03/28/2021   4:22 PM EDT  To: Melrose Nakayama, MD  Subject: restrictions/ therapy,                         Good afternoon Dr. Rhona Raider, I am a therapist at [facility].  [name] has been transferred to my care.  I have several questions.   1)  His wife stated that you said that he needed to be [facility] for [facility] but the pt is a resident at [facility] that has it's own therapist.  Can he not be seen at the SNF he is residing at?   2) the pt has a significant knee contracture and has an old worn velcro knee immobilizer that comes off everytime he sits as he does not kick his leg out.  Was he suppose to have something more supporting.  Does he need the knee immobilizer.     3) Is there a wt bearing restriction?     Thanks you for any help you may be able to offer in the care of this pt.   Rayetta Humphrey, PT CLT  [phone]  Rayetta Humphrey, PT CLT [phone]  04/03/2021, 2:29 PM  [facility] [address] [place], [place], [address] Phone: [phone]   Fax:  [phone]

## 2021-04-04 ENCOUNTER — Ambulatory Visit: Payer: Medicare Other

## 2021-04-04 DIAGNOSIS — R2689 Other abnormalities of gait and mobility: Secondary | ICD-10-CM | POA: Diagnosis not present

## 2021-04-04 DIAGNOSIS — I480 Paroxysmal atrial fibrillation: Secondary | ICD-10-CM | POA: Diagnosis not present

## 2021-04-04 DIAGNOSIS — M6281 Muscle weakness (generalized): Secondary | ICD-10-CM | POA: Diagnosis not present

## 2021-04-05 ENCOUNTER — Ambulatory Visit (HOSPITAL_COMMUNITY): Payer: Medicare Other | Admitting: Physical Therapy

## 2021-04-06 ENCOUNTER — Ambulatory Visit: Payer: Medicare Other

## 2021-04-07 ENCOUNTER — Ambulatory Visit (HOSPITAL_COMMUNITY): Payer: Medicare Other | Admitting: Physical Therapy

## 2021-04-08 DIAGNOSIS — I480 Paroxysmal atrial fibrillation: Secondary | ICD-10-CM | POA: Diagnosis not present

## 2021-04-08 DIAGNOSIS — M6281 Muscle weakness (generalized): Secondary | ICD-10-CM | POA: Diagnosis not present

## 2021-04-08 DIAGNOSIS — R2689 Other abnormalities of gait and mobility: Secondary | ICD-10-CM | POA: Diagnosis not present

## 2021-04-10 ENCOUNTER — Ambulatory Visit: Payer: Medicare Other

## 2021-04-11 DIAGNOSIS — I480 Paroxysmal atrial fibrillation: Secondary | ICD-10-CM | POA: Diagnosis not present

## 2021-04-11 DIAGNOSIS — R2689 Other abnormalities of gait and mobility: Secondary | ICD-10-CM | POA: Diagnosis not present

## 2021-04-11 DIAGNOSIS — M6281 Muscle weakness (generalized): Secondary | ICD-10-CM | POA: Diagnosis not present

## 2021-04-12 ENCOUNTER — Ambulatory Visit (HOSPITAL_COMMUNITY): Payer: Medicare Other

## 2021-04-12 ENCOUNTER — Ambulatory Visit: Payer: Medicare Other

## 2021-04-12 DIAGNOSIS — R2689 Other abnormalities of gait and mobility: Secondary | ICD-10-CM | POA: Diagnosis not present

## 2021-04-12 DIAGNOSIS — I480 Paroxysmal atrial fibrillation: Secondary | ICD-10-CM | POA: Diagnosis not present

## 2021-04-12 DIAGNOSIS — M6281 Muscle weakness (generalized): Secondary | ICD-10-CM | POA: Diagnosis not present

## 2021-04-13 DIAGNOSIS — E785 Hyperlipidemia, unspecified: Secondary | ICD-10-CM | POA: Diagnosis not present

## 2021-04-13 DIAGNOSIS — K219 Gastro-esophageal reflux disease without esophagitis: Secondary | ICD-10-CM | POA: Diagnosis not present

## 2021-04-13 DIAGNOSIS — I10 Essential (primary) hypertension: Secondary | ICD-10-CM | POA: Diagnosis not present

## 2021-04-13 DIAGNOSIS — K5900 Constipation, unspecified: Secondary | ICD-10-CM | POA: Diagnosis not present

## 2021-04-13 DIAGNOSIS — W19XXXD Unspecified fall, subsequent encounter: Secondary | ICD-10-CM | POA: Diagnosis not present

## 2021-04-13 DIAGNOSIS — I639 Cerebral infarction, unspecified: Secondary | ICD-10-CM | POA: Diagnosis not present

## 2021-04-13 DIAGNOSIS — R2689 Other abnormalities of gait and mobility: Secondary | ICD-10-CM | POA: Diagnosis not present

## 2021-04-13 DIAGNOSIS — R52 Pain, unspecified: Secondary | ICD-10-CM | POA: Diagnosis not present

## 2021-04-13 DIAGNOSIS — E1169 Type 2 diabetes mellitus with other specified complication: Secondary | ICD-10-CM | POA: Diagnosis not present

## 2021-04-13 DIAGNOSIS — M6281 Muscle weakness (generalized): Secondary | ICD-10-CM | POA: Diagnosis not present

## 2021-04-13 DIAGNOSIS — I480 Paroxysmal atrial fibrillation: Secondary | ICD-10-CM | POA: Diagnosis not present

## 2021-04-14 ENCOUNTER — Ambulatory Visit (HOSPITAL_COMMUNITY): Payer: Medicare Other | Admitting: Physical Therapy

## 2021-04-17 DIAGNOSIS — F32A Depression, unspecified: Secondary | ICD-10-CM | POA: Diagnosis not present

## 2021-04-17 DIAGNOSIS — I480 Paroxysmal atrial fibrillation: Secondary | ICD-10-CM | POA: Diagnosis not present

## 2021-04-17 DIAGNOSIS — I10 Essential (primary) hypertension: Secondary | ICD-10-CM | POA: Diagnosis not present

## 2021-04-17 DIAGNOSIS — I639 Cerebral infarction, unspecified: Secondary | ICD-10-CM | POA: Diagnosis not present

## 2021-04-17 DIAGNOSIS — R52 Pain, unspecified: Secondary | ICD-10-CM | POA: Diagnosis not present

## 2021-04-17 DIAGNOSIS — K219 Gastro-esophageal reflux disease without esophagitis: Secondary | ICD-10-CM | POA: Diagnosis not present

## 2021-04-17 DIAGNOSIS — E785 Hyperlipidemia, unspecified: Secondary | ICD-10-CM | POA: Diagnosis not present

## 2021-04-17 DIAGNOSIS — E1169 Type 2 diabetes mellitus with other specified complication: Secondary | ICD-10-CM | POA: Diagnosis not present

## 2021-04-18 DIAGNOSIS — M6281 Muscle weakness (generalized): Secondary | ICD-10-CM | POA: Diagnosis not present

## 2021-04-18 DIAGNOSIS — R2689 Other abnormalities of gait and mobility: Secondary | ICD-10-CM | POA: Diagnosis not present

## 2021-04-18 DIAGNOSIS — I480 Paroxysmal atrial fibrillation: Secondary | ICD-10-CM | POA: Diagnosis not present

## 2021-04-19 DIAGNOSIS — I480 Paroxysmal atrial fibrillation: Secondary | ICD-10-CM | POA: Diagnosis not present

## 2021-04-19 DIAGNOSIS — M6281 Muscle weakness (generalized): Secondary | ICD-10-CM | POA: Diagnosis not present

## 2021-04-19 DIAGNOSIS — M25562 Pain in left knee: Secondary | ICD-10-CM | POA: Diagnosis not present

## 2021-04-19 DIAGNOSIS — R2689 Other abnormalities of gait and mobility: Secondary | ICD-10-CM | POA: Diagnosis not present

## 2021-04-27 DIAGNOSIS — M6281 Muscle weakness (generalized): Secondary | ICD-10-CM | POA: Diagnosis not present

## 2021-04-27 DIAGNOSIS — I480 Paroxysmal atrial fibrillation: Secondary | ICD-10-CM | POA: Diagnosis not present

## 2021-04-27 DIAGNOSIS — R2689 Other abnormalities of gait and mobility: Secondary | ICD-10-CM | POA: Diagnosis not present

## 2021-04-28 DIAGNOSIS — R2689 Other abnormalities of gait and mobility: Secondary | ICD-10-CM | POA: Diagnosis not present

## 2021-04-28 DIAGNOSIS — M6281 Muscle weakness (generalized): Secondary | ICD-10-CM | POA: Diagnosis not present

## 2021-04-28 DIAGNOSIS — I480 Paroxysmal atrial fibrillation: Secondary | ICD-10-CM | POA: Diagnosis not present

## 2021-05-02 DIAGNOSIS — R2689 Other abnormalities of gait and mobility: Secondary | ICD-10-CM | POA: Diagnosis not present

## 2021-05-02 DIAGNOSIS — I480 Paroxysmal atrial fibrillation: Secondary | ICD-10-CM | POA: Diagnosis not present

## 2021-05-02 DIAGNOSIS — M6281 Muscle weakness (generalized): Secondary | ICD-10-CM | POA: Diagnosis not present

## 2021-05-03 DIAGNOSIS — L84 Corns and callosities: Secondary | ICD-10-CM | POA: Diagnosis not present

## 2021-05-03 DIAGNOSIS — E1151 Type 2 diabetes mellitus with diabetic peripheral angiopathy without gangrene: Secondary | ICD-10-CM | POA: Diagnosis not present

## 2021-05-03 DIAGNOSIS — I480 Paroxysmal atrial fibrillation: Secondary | ICD-10-CM | POA: Diagnosis not present

## 2021-05-03 DIAGNOSIS — M6281 Muscle weakness (generalized): Secondary | ICD-10-CM | POA: Diagnosis not present

## 2021-05-03 DIAGNOSIS — R2689 Other abnormalities of gait and mobility: Secondary | ICD-10-CM | POA: Diagnosis not present

## 2021-05-04 DIAGNOSIS — R2689 Other abnormalities of gait and mobility: Secondary | ICD-10-CM | POA: Diagnosis not present

## 2021-05-04 DIAGNOSIS — M6281 Muscle weakness (generalized): Secondary | ICD-10-CM | POA: Diagnosis not present

## 2021-05-04 DIAGNOSIS — I480 Paroxysmal atrial fibrillation: Secondary | ICD-10-CM | POA: Diagnosis not present

## 2021-05-08 DIAGNOSIS — I480 Paroxysmal atrial fibrillation: Secondary | ICD-10-CM | POA: Diagnosis not present

## 2021-05-08 DIAGNOSIS — M6281 Muscle weakness (generalized): Secondary | ICD-10-CM | POA: Diagnosis not present

## 2021-05-08 DIAGNOSIS — R2689 Other abnormalities of gait and mobility: Secondary | ICD-10-CM | POA: Diagnosis not present

## 2021-05-09 DIAGNOSIS — M6281 Muscle weakness (generalized): Secondary | ICD-10-CM | POA: Diagnosis not present

## 2021-05-09 DIAGNOSIS — I480 Paroxysmal atrial fibrillation: Secondary | ICD-10-CM | POA: Diagnosis not present

## 2021-05-09 DIAGNOSIS — R2689 Other abnormalities of gait and mobility: Secondary | ICD-10-CM | POA: Diagnosis not present

## 2021-05-10 DIAGNOSIS — I480 Paroxysmal atrial fibrillation: Secondary | ICD-10-CM | POA: Diagnosis not present

## 2021-05-10 DIAGNOSIS — M6281 Muscle weakness (generalized): Secondary | ICD-10-CM | POA: Diagnosis not present

## 2021-05-10 DIAGNOSIS — R2689 Other abnormalities of gait and mobility: Secondary | ICD-10-CM | POA: Diagnosis not present

## 2021-05-11 DIAGNOSIS — R2689 Other abnormalities of gait and mobility: Secondary | ICD-10-CM | POA: Diagnosis not present

## 2021-05-11 DIAGNOSIS — I480 Paroxysmal atrial fibrillation: Secondary | ICD-10-CM | POA: Diagnosis not present

## 2021-05-11 DIAGNOSIS — M1712 Unilateral primary osteoarthritis, left knee: Secondary | ICD-10-CM | POA: Diagnosis not present

## 2021-05-11 DIAGNOSIS — M1711 Unilateral primary osteoarthritis, right knee: Secondary | ICD-10-CM | POA: Diagnosis not present

## 2021-05-11 DIAGNOSIS — M6281 Muscle weakness (generalized): Secondary | ICD-10-CM | POA: Diagnosis not present

## 2021-05-12 DIAGNOSIS — H353121 Nonexudative age-related macular degeneration, left eye, early dry stage: Secondary | ICD-10-CM | POA: Diagnosis not present

## 2021-05-12 DIAGNOSIS — M6281 Muscle weakness (generalized): Secondary | ICD-10-CM | POA: Diagnosis not present

## 2021-05-12 DIAGNOSIS — I480 Paroxysmal atrial fibrillation: Secondary | ICD-10-CM | POA: Diagnosis not present

## 2021-05-12 DIAGNOSIS — R2689 Other abnormalities of gait and mobility: Secondary | ICD-10-CM | POA: Diagnosis not present

## 2021-05-12 DIAGNOSIS — H524 Presbyopia: Secondary | ICD-10-CM | POA: Diagnosis not present

## 2021-05-15 DIAGNOSIS — E785 Hyperlipidemia, unspecified: Secondary | ICD-10-CM | POA: Diagnosis not present

## 2021-05-15 DIAGNOSIS — E1169 Type 2 diabetes mellitus with other specified complication: Secondary | ICD-10-CM | POA: Diagnosis not present

## 2021-05-15 DIAGNOSIS — R5381 Other malaise: Secondary | ICD-10-CM | POA: Diagnosis not present

## 2021-05-15 DIAGNOSIS — M170 Bilateral primary osteoarthritis of knee: Secondary | ICD-10-CM | POA: Diagnosis not present

## 2021-05-15 DIAGNOSIS — K219 Gastro-esophageal reflux disease without esophagitis: Secondary | ICD-10-CM | POA: Diagnosis not present

## 2021-05-15 DIAGNOSIS — M6281 Muscle weakness (generalized): Secondary | ICD-10-CM | POA: Diagnosis not present

## 2021-05-15 DIAGNOSIS — R2689 Other abnormalities of gait and mobility: Secondary | ICD-10-CM | POA: Diagnosis not present

## 2021-05-15 DIAGNOSIS — R52 Pain, unspecified: Secondary | ICD-10-CM | POA: Diagnosis not present

## 2021-05-15 DIAGNOSIS — F32A Depression, unspecified: Secondary | ICD-10-CM | POA: Diagnosis not present

## 2021-05-15 DIAGNOSIS — I480 Paroxysmal atrial fibrillation: Secondary | ICD-10-CM | POA: Diagnosis not present

## 2021-05-16 DIAGNOSIS — G9341 Metabolic encephalopathy: Secondary | ICD-10-CM | POA: Diagnosis not present

## 2021-05-19 DIAGNOSIS — I480 Paroxysmal atrial fibrillation: Secondary | ICD-10-CM | POA: Diagnosis not present

## 2021-05-19 DIAGNOSIS — R2689 Other abnormalities of gait and mobility: Secondary | ICD-10-CM | POA: Diagnosis not present

## 2021-05-19 DIAGNOSIS — M6281 Muscle weakness (generalized): Secondary | ICD-10-CM | POA: Diagnosis not present

## 2021-05-22 DIAGNOSIS — I480 Paroxysmal atrial fibrillation: Secondary | ICD-10-CM | POA: Diagnosis not present

## 2021-05-22 DIAGNOSIS — R2689 Other abnormalities of gait and mobility: Secondary | ICD-10-CM | POA: Diagnosis not present

## 2021-05-22 DIAGNOSIS — M6281 Muscle weakness (generalized): Secondary | ICD-10-CM | POA: Diagnosis not present

## 2021-05-23 DIAGNOSIS — I480 Paroxysmal atrial fibrillation: Secondary | ICD-10-CM | POA: Diagnosis not present

## 2021-05-23 DIAGNOSIS — M6281 Muscle weakness (generalized): Secondary | ICD-10-CM | POA: Diagnosis not present

## 2021-05-23 DIAGNOSIS — R2689 Other abnormalities of gait and mobility: Secondary | ICD-10-CM | POA: Diagnosis not present

## 2021-05-24 DIAGNOSIS — R2689 Other abnormalities of gait and mobility: Secondary | ICD-10-CM | POA: Diagnosis not present

## 2021-05-24 DIAGNOSIS — M6281 Muscle weakness (generalized): Secondary | ICD-10-CM | POA: Diagnosis not present

## 2021-05-24 DIAGNOSIS — I480 Paroxysmal atrial fibrillation: Secondary | ICD-10-CM | POA: Diagnosis not present

## 2021-05-25 DIAGNOSIS — I480 Paroxysmal atrial fibrillation: Secondary | ICD-10-CM | POA: Diagnosis not present

## 2021-05-25 DIAGNOSIS — R2689 Other abnormalities of gait and mobility: Secondary | ICD-10-CM | POA: Diagnosis not present

## 2021-05-25 DIAGNOSIS — M6281 Muscle weakness (generalized): Secondary | ICD-10-CM | POA: Diagnosis not present

## 2021-05-26 DIAGNOSIS — I480 Paroxysmal atrial fibrillation: Secondary | ICD-10-CM | POA: Diagnosis not present

## 2021-05-26 DIAGNOSIS — M6281 Muscle weakness (generalized): Secondary | ICD-10-CM | POA: Diagnosis not present

## 2021-05-26 DIAGNOSIS — R2689 Other abnormalities of gait and mobility: Secondary | ICD-10-CM | POA: Diagnosis not present

## 2021-05-30 DIAGNOSIS — M6281 Muscle weakness (generalized): Secondary | ICD-10-CM | POA: Diagnosis not present

## 2021-05-30 DIAGNOSIS — I480 Paroxysmal atrial fibrillation: Secondary | ICD-10-CM | POA: Diagnosis not present

## 2021-05-30 DIAGNOSIS — R2689 Other abnormalities of gait and mobility: Secondary | ICD-10-CM | POA: Diagnosis not present

## 2021-06-01 DIAGNOSIS — M6281 Muscle weakness (generalized): Secondary | ICD-10-CM | POA: Diagnosis not present

## 2021-06-01 DIAGNOSIS — I480 Paroxysmal atrial fibrillation: Secondary | ICD-10-CM | POA: Diagnosis not present

## 2021-06-01 DIAGNOSIS — R2689 Other abnormalities of gait and mobility: Secondary | ICD-10-CM | POA: Diagnosis not present

## 2021-06-06 DIAGNOSIS — I480 Paroxysmal atrial fibrillation: Secondary | ICD-10-CM | POA: Diagnosis not present

## 2021-06-06 DIAGNOSIS — M6281 Muscle weakness (generalized): Secondary | ICD-10-CM | POA: Diagnosis not present

## 2021-06-06 DIAGNOSIS — R2689 Other abnormalities of gait and mobility: Secondary | ICD-10-CM | POA: Diagnosis not present

## 2021-06-07 DIAGNOSIS — E119 Type 2 diabetes mellitus without complications: Secondary | ICD-10-CM | POA: Diagnosis not present

## 2021-06-07 DIAGNOSIS — I480 Paroxysmal atrial fibrillation: Secondary | ICD-10-CM | POA: Diagnosis not present

## 2021-06-07 DIAGNOSIS — R2689 Other abnormalities of gait and mobility: Secondary | ICD-10-CM | POA: Diagnosis not present

## 2021-06-07 DIAGNOSIS — M6281 Muscle weakness (generalized): Secondary | ICD-10-CM | POA: Diagnosis not present

## 2021-06-08 DIAGNOSIS — R2689 Other abnormalities of gait and mobility: Secondary | ICD-10-CM | POA: Diagnosis not present

## 2021-06-08 DIAGNOSIS — I480 Paroxysmal atrial fibrillation: Secondary | ICD-10-CM | POA: Diagnosis not present

## 2021-06-08 DIAGNOSIS — M6281 Muscle weakness (generalized): Secondary | ICD-10-CM | POA: Diagnosis not present

## 2021-06-12 DIAGNOSIS — R2689 Other abnormalities of gait and mobility: Secondary | ICD-10-CM | POA: Diagnosis not present

## 2021-06-12 DIAGNOSIS — I480 Paroxysmal atrial fibrillation: Secondary | ICD-10-CM | POA: Diagnosis not present

## 2021-06-12 DIAGNOSIS — M6281 Muscle weakness (generalized): Secondary | ICD-10-CM | POA: Diagnosis not present

## 2021-06-13 DIAGNOSIS — H5213 Myopia, bilateral: Secondary | ICD-10-CM | POA: Diagnosis not present

## 2021-06-14 DIAGNOSIS — I480 Paroxysmal atrial fibrillation: Secondary | ICD-10-CM | POA: Diagnosis not present

## 2021-06-14 DIAGNOSIS — R2689 Other abnormalities of gait and mobility: Secondary | ICD-10-CM | POA: Diagnosis not present

## 2021-06-14 DIAGNOSIS — M6281 Muscle weakness (generalized): Secondary | ICD-10-CM | POA: Diagnosis not present

## 2021-06-15 DIAGNOSIS — M6281 Muscle weakness (generalized): Secondary | ICD-10-CM | POA: Diagnosis not present

## 2021-06-15 DIAGNOSIS — K219 Gastro-esophageal reflux disease without esophagitis: Secondary | ICD-10-CM | POA: Diagnosis not present

## 2021-06-15 DIAGNOSIS — R2689 Other abnormalities of gait and mobility: Secondary | ICD-10-CM | POA: Diagnosis not present

## 2021-06-15 DIAGNOSIS — G4700 Insomnia, unspecified: Secondary | ICD-10-CM | POA: Diagnosis not present

## 2021-06-15 DIAGNOSIS — E1169 Type 2 diabetes mellitus with other specified complication: Secondary | ICD-10-CM | POA: Diagnosis not present

## 2021-06-15 DIAGNOSIS — R52 Pain, unspecified: Secondary | ICD-10-CM | POA: Diagnosis not present

## 2021-06-15 DIAGNOSIS — F32A Depression, unspecified: Secondary | ICD-10-CM | POA: Diagnosis not present

## 2021-06-15 DIAGNOSIS — I480 Paroxysmal atrial fibrillation: Secondary | ICD-10-CM | POA: Diagnosis not present

## 2021-06-15 DIAGNOSIS — E785 Hyperlipidemia, unspecified: Secondary | ICD-10-CM | POA: Diagnosis not present

## 2021-06-15 DIAGNOSIS — K5900 Constipation, unspecified: Secondary | ICD-10-CM | POA: Diagnosis not present

## 2021-06-15 DIAGNOSIS — I10 Essential (primary) hypertension: Secondary | ICD-10-CM | POA: Diagnosis not present

## 2021-08-14 ENCOUNTER — Emergency Department (HOSPITAL_COMMUNITY)
Admission: EM | Admit: 2021-08-14 | Discharge: 2021-08-15 | Disposition: A | Discharge status: Home / Self Care | Payer: Medicare (Managed Care) | Attending: Emergency Medicine | Admitting: Emergency Medicine

## 2021-08-14 ENCOUNTER — Emergency Department (HOSPITAL_COMMUNITY): Payer: Medicare (Managed Care)

## 2021-08-14 ENCOUNTER — Other Ambulatory Visit: Payer: Self-pay

## 2021-08-14 ENCOUNTER — Encounter (HOSPITAL_COMMUNITY): Payer: Self-pay | Admitting: *Deleted

## 2021-08-14 DIAGNOSIS — N3001 Acute cystitis with hematuria: Secondary | ICD-10-CM | POA: Diagnosis not present

## 2021-08-14 DIAGNOSIS — Z7951 Long term (current) use of inhaled steroids: Secondary | ICD-10-CM | POA: Insufficient documentation

## 2021-08-14 DIAGNOSIS — E1110 Type 2 diabetes mellitus with ketoacidosis without coma: Secondary | ICD-10-CM | POA: Diagnosis not present

## 2021-08-14 DIAGNOSIS — I5032 Chronic diastolic (congestive) heart failure: Secondary | ICD-10-CM | POA: Diagnosis not present

## 2021-08-14 DIAGNOSIS — Z7982 Long term (current) use of aspirin: Secondary | ICD-10-CM | POA: Diagnosis not present

## 2021-08-14 DIAGNOSIS — I4891 Unspecified atrial fibrillation: Secondary | ICD-10-CM | POA: Diagnosis not present

## 2021-08-14 DIAGNOSIS — S0990XA Unspecified injury of head, initial encounter: Secondary | ICD-10-CM | POA: Diagnosis not present

## 2021-08-14 DIAGNOSIS — W01198A Fall on same level from slipping, tripping and stumbling with subsequent striking against other object, initial encounter: Secondary | ICD-10-CM | POA: Diagnosis not present

## 2021-08-14 DIAGNOSIS — Z794 Long term (current) use of insulin: Secondary | ICD-10-CM | POA: Insufficient documentation

## 2021-08-14 DIAGNOSIS — I110 Hypertensive heart disease with heart failure: Secondary | ICD-10-CM | POA: Diagnosis not present

## 2021-08-14 DIAGNOSIS — Z87891 Personal history of nicotine dependence: Secondary | ICD-10-CM | POA: Insufficient documentation

## 2021-08-14 DIAGNOSIS — I2510 Atherosclerotic heart disease of native coronary artery without angina pectoris: Secondary | ICD-10-CM | POA: Diagnosis not present

## 2021-08-14 DIAGNOSIS — J449 Chronic obstructive pulmonary disease, unspecified: Secondary | ICD-10-CM | POA: Diagnosis not present

## 2021-08-14 DIAGNOSIS — Z7901 Long term (current) use of anticoagulants: Secondary | ICD-10-CM | POA: Insufficient documentation

## 2021-08-14 DIAGNOSIS — E1140 Type 2 diabetes mellitus with diabetic neuropathy, unspecified: Secondary | ICD-10-CM | POA: Insufficient documentation

## 2021-08-14 DIAGNOSIS — W19XXXA Unspecified fall, initial encounter: Secondary | ICD-10-CM

## 2021-08-14 DIAGNOSIS — Z20822 Contact with and (suspected) exposure to covid-19: Secondary | ICD-10-CM | POA: Diagnosis not present

## 2021-08-14 DIAGNOSIS — Y92129 Unspecified place in nursing home as the place of occurrence of the external cause: Secondary | ICD-10-CM | POA: Diagnosis not present

## 2021-08-14 HISTORY — DX: Gastro-esophageal reflux disease without esophagitis: K21.9

## 2021-08-14 HISTORY — DX: Cerebral infarction, unspecified: I63.9

## 2021-08-14 HISTORY — DX: Paroxysmal atrial fibrillation: I48.0

## 2021-08-14 HISTORY — DX: Major depressive disorder, single episode, unspecified: F32.9

## 2021-08-14 LAB — URINALYSIS, MICROSCOPIC (REFLEX)
RBC / HPF: >50 RBC/hpf (ref 0–5)
Squamous Epithelial / HPF: NONE SEEN (ref 0–5)
WBC, UA: >50 WBC/hpf (ref 0–5)

## 2021-08-14 LAB — URINALYSIS, ROUTINE W REFLEX MICROSCOPIC
Bilirubin Urine: NEGATIVE
Glucose, UA: 250 mg/dL — AB
Ketones, ur: NEGATIVE mg/dL
Nitrite: NEGATIVE
Protein, ur: 30 mg/dL — AB
Specific Gravity, Urine: 1.025 (ref 1.005–1.030)
pH: 5.5 (ref 5.0–8.0)

## 2021-08-14 LAB — RESP PANEL BY RT-PCR (FLU A&B, COVID) ARPGX2
Influenza A by PCR: NEGATIVE
Influenza B by PCR: NEGATIVE
SARS Coronavirus 2 by RT PCR: NEGATIVE

## 2021-08-14 MED ORDER — CEPHALEXIN 500 MG PO CAPS: 500.0000 mg | ORAL_CAPSULE | Freq: Two times a day (BID) | ORAL | 0 refills | Status: DC

## 2021-08-14 MED ORDER — CEPHALEXIN 500 MG PO CAPS: 500.0000 mg | ORAL_CAPSULE | Freq: Two times a day (BID) | ORAL | 0 refills | Status: AC

## 2021-08-14 NOTE — ED Provider Notes (Signed)
[name] EMERGENCY DEPARTMENT Provider Note   CSN: [id number] Arrival date & time: 08/14/21  1550     History Chief Complaint  Patient presents with   Rick Cruz is a 75 y.o. male.  HPI  HPI will deferred due to level 5 caveat nonverbal  Patient with medical history including CHF, COPD, chronic respiratory failure on 3 L via nasal cannula, proximal A. fib, currently on Eliquis presents from [place] Due to a fall.  Spoke with nursing staff from facility they states that patient had fell in between the bed and the wall, they note that he hit his head and face on the floor, states that there is no loss of consciousness, states that he is able to get up on his own, he did not have any complaints in his neck, back, chest, upper lower extremities, chest or abdomen.  They state that he is at his normal baseline, they state he is generally combative and confused at all times, they does not endorse  fevers, chills, cough, congestion, urinary symptoms.  No other complaints at this time.  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   CHF (congestive heart failure) (HCC)    diastolic    Chronic pain    COPD (chronic obstructive pulmonary disease) (HCC)    Depression    Diabetes mellitus without complication (HCC)    insulin pump   GERD (gastroesophageal reflux disease)    Hypercholesterolemia    Hypertension    MDD (major depressive disorder)    On home O2    3 liters   Oxygen deficiency    Paroxysmal atrial fibrillation (HCC)    Schizophrenia ([place])    Sleep apnea    noncompliant with BiPAP   Sleep apnea    Stroke [facility])    Tobacco abuse     Patient Active Problem List   Diagnosis Date Noted   UTI (urinary tract infection) 12/14/2020   CVA (cerebral vascular accident) ([place]) 10/12/2020   Ischemic cerebrovascular accident (CVA) ([place]) 10/11/2020   Hypoalbuminemia 10/11/2020   Class 1 obesity 10/11/2020    Iron deficiency anemia 39/11/90   Acute metabolic encephalopathy 33/00/7622   DKA, type 2, not at goal [facility]) 11/03/2019   Heart failure ([place]) 02/02/2019   Hyperglycemia    Altered mental status 11/21/2018   Diabetic foot ([place]) 07/23/2018   Accidental fall 06/25/2018   Supplemental oxygen dependent 06/25/2018   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   Neck pain 01/09/2018   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   Diabetic neuropathy ([place]) 12/13/2017   Gastroesophageal reflux disease 12/13/2017   Insomnia 07/30/2017   Depression 07/19/2016   OSA (obstructive sleep apnea)    Chronic respiratory failure with hypercapnia ([place])    Undifferentiated schizophrenia ([place])    Uncontrolled type 2 diabetes mellitus with complication    Anemia of chronic disease    Paroxysmal atrial fibrillation (HCC)    Hyponatremia 06/12/2016   Aspiration pneumonia ([place]) 08/16/2015   Seborrheic keratosis 08/04/2015   Pure hypercholesterolemia 07/05/2015   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) 08/06/2014   Recurrent falls 05/08/2014   Ankle fracture, right 05/08/2014  Ankle fracture, left 05/08/2014   C. 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    COLONOSCOPY  July 2011   Dr. Benson Norway: multiple polyps, internal and external hemorrhoids, diverticulosis. tubular adenoma   ESOPHAGOGASTRODUODENOSCOPY (EGD) WITH PROPOFOL N/A 04/20/2013   Procedure: ESOPHAGOGASTRODUODENOSCOPY (EGD) WITH PROPOFOL;  Surgeon: Daneil Dolin, MD;  Location: [facility];  Service: Endoscopy;  Laterality: N/A;   KNEE SURGERY     X2       Family History  Problem Relation Age of Onset   Thyroid disease Mother    Parkinson's disease Father    Parkinson's disease Other    Colon cancer Neg Hx     Social History   Tobacco Use   Smoking status: Former    Packs/day: 1.00    Years: 50.00    Pack years: 50.00    Types: Cigarettes    Quit date: 09/16/2013    Years since quitting: 7.9   Smokeless tobacco: Never  Vaping Use   Vaping Use: Never used  Substance Use Topics   Alcohol use: No    Comment: history of ETOH abuse in remote past, none in at least 10 years   Drug use: No    Home Medications Prior to Admission medications   Medication Sig Start Date End Date Taking? Authorizing Provider  acetaminophen (TYLENOL) 325 MG tablet Take 2 tablets (650 mg total) by mouth every 6 (six) hours as needed for mild pain, fever or headache (or Fever >/= 101). 02/22/20  Yes Emokpae, Courage, MD  apixaban (ELIQUIS) 5 MG TABS tablet Take 1 tablet (5 mg total) by mouth 2 (two) times daily. 02/22/20  Yes Emokpae, Courage, MD  aspirin EC 81 MG tablet Take 81 mg by mouth daily. Swallow whole.   Yes [provider]  cholecalciferol (VITAMIN D) 1000 UNITS tablet Take 1,000 Units by mouth 2 (two) times daily.   Yes [provider]  Cranberry 450 MG CAPS Take 1 capsule by mouth daily.   Yes [provider]  diclofenac Sodium (VOLTAREN) 1 % GEL  Apply 2 g topically 4 (four) times daily.   Yes [provider]  docusate sodium (COLACE) 100 MG capsule Take 100 mg by mouth 2 (two) times daily.   Yes [provider]  DULoxetine (CYMBALTA) 30 MG capsule Take 1 capsule (30 mg total) by mouth every morning. 02/22/20  Yes Emokpae, Courage, MD  gabapentin (NEURONTIN) 300 MG capsule Take 1 capsule (300 mg total) by mouth at bedtime. 10/18/20  Yes Eulogio Bear U, DO  hydrOXYzine (ATARAX/VISTARIL) 25 MG tablet Take 1 tablet (25 mg total) by mouth every 12 (twelve) hours as needed for anxiety or itching (Anxiety and insomnia or restlessness). 02/22/20  Yes Emokpae, Courage, MD  insulin glargine (LANTUS SOLOSTAR) 100 UNIT/ML Solostar Pen Inject 20 Units into the skin at bedtime.  10/18/20  Yes Vann, Jessica U, DO  melatonin 3 MG TABS tablet Take 6 mg by mouth at bedtime.   Yes [provider]  memantine (NAMENDA XR) 28 MG CP24 24 hr capsule Take 28 mg by mouth daily.   Yes [provider]  metoprolol tartrate (LOPRESSOR) 25 MG tablet Take 1 tablet (25 mg total) by mouth 2 (two) times daily. 10/18/20  Yes Geradine Girt, DO  Multiple Vitamins-Minerals (OCUVITE EXTRA) TABS Take 1 tablet by mouth daily.   Yes [provider]  omeprazole (PRILOSEC) 20 MG capsule Take 20 mg by mouth 2 (two) times daily before a meal.   Yes [provider]  OXYGEN Inhale 2 L into the lungs continuous.   Yes [provider]  Phenylephrine-DM-GG (GUIAFEN II DM PO) Take 400 mg by mouth 3 (three) times daily.   Yes [provider]  QUEtiapine (SEROQUEL) 25 MG tablet Take 1 tablet (25 mg total) by mouth 2 (two) times daily. Patient taking differently: Take 50 mg by mouth 2 (two) times daily. 12/18/20  Yes Barton Dubois, MD  rosuvastatin (CRESTOR) 10 MG tablet Take 10 mg by mouth daily.   Yes [provider]  TRULICITY 3 ZO/1.0RU SOPN Inject 3 mg into the skin See admin instructions. Every Tuesday 06/07/20  Yes  [provider]  albuterol (PROVENTIL) (2.5 MG/3ML) 0.083% nebulizer solution Take 3 mLs (2.5 mg total) by nebulization every 6 (six) hours as needed for wheezing or shortness of breath. 02/22/20   Roxan Hockey, MD  azithromycin (ZITHROMAX) 250 MG tablet Take 1 tablet (250 mg total) by mouth daily. Take first 2 tablets together, then 1 every day until finished. Patient not taking: Reported on 08/14/2021 03/02/21   Mesner, Corene Cornea, MD  bacitracin-polymyxin b (POLYSPORIN) ointment Apply 1 application topically 2 (two) times daily.    [provider]  carboxymethylcellulose (REFRESH PLUS) 0.5 % SOLN Apply 1 drop to eye 3 (three) times daily as needed.    [provider]  cephALEXin (KEFLEX) 500 MG capsule Take 1 capsule (500 mg total) by mouth 2 (two) times daily for 7 days. 08/14/21 08/21/21  Marcello Fennel, PA-C  Fluticasone-Umeclidin-Vilant (TRELEGY ELLIPTA) 100-62.5-25 MCG/INH AEPB Inhale 1 puff into the lungs daily. Patient not taking: Reported on 08/14/2021 02/22/20   Roxan Hockey, MD  insulin aspart (NOVOLOG) 100 UNIT/ML injection Inject 0-15 Units into the skin 3 (three) times daily with meals. Patient not taking: Reported on 08/14/2021 10/18/20   Geradine Girt, DO  Multiple Vitamins-Minerals (PRESERVISION AREDS 2+MULTI VIT PO) Take 2 tablets by mouth in the morning and at bedtime. Patient not taking: Reported on 08/14/2021    [provider]  tiotropium (SPIRIVA) 18 MCG inhalation capsule Place 1 capsule (18 mcg total) into inhaler and inhale daily. Patient not taking: Reported on 08/14/2021 02/22/20   Roxan Hockey, MD  triamcinolone cream (KENALOG) 0.1 % Apply 1 application topically 2 (two) times daily. Patient not taking: Reported on 08/14/2021 02/11/20   Emerson Monte, FNP    Allergies    Phenergan [promethazine hcl], Haldol [haloperidol lactate], and Metformin and related  Review of Systems   Review of Systems  Unable to perform ROS:  Patient nonverbal   Physical Exam Updated Vital Signs BP 131/82   Pulse 88   Temp 98.4 F (36.9 C) (Oral)   Resp 14   Ht 6\' 6"  (1.981 m)   Wt 107.5 kg   SpO2 96%   BMI 27.39 kg/m  Physical Exam Vitals and nursing note reviewed.  Constitutional:      General: He is not in acute distress.    Appearance: He is not ill-appearing.     Comments: Chronic deconditioned state, no acute distress, vital signs reassuring, on room air nontachypneic, nonhypoxic.  HENT:     Head: Normocephalic and atraumatic.     Comments: No raccoon eyes or battle sign present, no gross deformities noted.    Nose: No congestion.     Mouth/Throat:     Mouth: Mucous membranes are moist.     Pharynx: Oropharynx is clear.  Eyes:     Conjunctiva/sclera: Conjunctivae normal.  Cardiovascular:     Rate and Rhythm: Normal rate and regular rhythm.     Pulses: Normal pulses.     Heart sounds: No murmur heard.   No friction rub. No gallop.  Pulmonary:     Effort: No respiratory distress.     Breath sounds: No wheezing, rhonchi or rales.  Chest:     Chest wall: No tenderness.  Abdominal:     Palpations: Abdomen is soft.     Tenderness: There is no abdominal tenderness. There is no right CVA tenderness or left CVA tenderness.  Musculoskeletal:     Cervical back: No tenderness.     Comments: Moving all 4 extremities, spine was palpated is nontender to palpation, no step-off deformities present.  No pelvis instability, no leg shortening, no internal or external rotation present.  Slight tenderness of the left knee no crepitus or deformities present.  Neurovascularly intact.  Skin:    General: Skin is warm and dry.     Comments: No rashes or ecchymosis present on skin exam.  Neurological:     Mental Status: He is alert.     Comments: Patient is altered at baseline, no noted facial asymmetry, no unilateral weakness, able to follow commands without difficulty.  No slurring of his words.  Psychiatric:        Mood  and Affect: Mood normal.    ED Results / Procedures / Treatments   Labs (all labs ordered are listed, but only abnormal results are displayed) Labs Reviewed  URINALYSIS, ROUTINE W REFLEX MICROSCOPIC - Abnormal; Notable for the following components:      Result Value   Color, Urine STRAW (*)    APPearance CLOUDY (*)    Glucose, UA 250 (*)    Hgb urine dipstick LARGE (*)    Protein, ur 30 (*)    Leukocytes,Ua MODERATE (*)    All other components within normal limits  URINALYSIS, MICROSCOPIC (REFLEX) - Abnormal; Notable for the following components:   Bacteria, UA MANY (*)    All other components within normal limits  RESP PANEL BY RT-PCR (FLU A&B, COVID) [id number]  URINE CULTURE    EKG EKG Interpretation  Date/Time:  Monday August 14 2021 15:57:55 EST Ventricular Rate:  95 PR Interval:  193 QRS Duration: 137 QT Interval:  375 QTC Calculation: 472 R Axis:   -14 Text Interpretation: Sinus rhythm Right bundle branch block Confirmed by Sherwood Gambler [phone]) on 08/14/2021 4:22:11 PM  Radiology DG Pelvis 1-2 Views  Result Date: 08/14/2021 CLINICAL DATA:  Confusion fall EXAM: PELVIS - 1-2 VIEW COMPARISON:  11/11/2020, 10/11/2020, 12/05/2014 FINDINGS: Limited by positioning, femoral necks inadequately visualized due to positioning. Pubic symphysis and rami grossly intact. SI joints are non widened. Chronic osseous deformity at the right iliac bone. IMPRESSION: Very limited by positioning, femoral necks poorly visible due to superimposition  of trochanters. No gross acute osseous abnormality is seen Electronically Signed   By: Donavan Foil M.D.   On: 08/14/2021 18:54   CT Head Wo Contrast  Result Date: 08/14/2021 CLINICAL DATA:  Head trauma.  Combative patient EXAM: CT HEAD WITHOUT CONTRAST CT MAXILLOFACIAL WITHOUT CONTRAST CT CERVICAL SPINE WITHOUT CONTRAST TECHNIQUE: Multidetector CT imaging of the head, cervical spine, and maxillofacial structures were performed using the  standard protocol without intravenous contrast. Multiplanar CT image reconstructions of the cervical spine and maxillofacial structures were also generated. COMPARISON:  None. FINDINGS: CT HEAD FINDINGS Brain: No acute intracranial hemorrhage. No focal mass lesion. No CT evidence of acute infarction. No midline shift or mass effect. No hydrocephalus. Basilar cisterns are patent. There are periventricular and subcortical white matter hypodensities. Generalized cortical atrophy. Vascular: No hyperdense vessel or unexpected calcification. Skull: Normal. Negative for fracture or focal lesion. Sinuses/Orbits: Paranasal sinuses and mastoid air cells are clear. Orbits are clear. Other: None. CT MAXILLOFACIAL FINDINGS Osseous: No fracture or mandibular dislocation. No destructive process. Orbits: Negative. No traumatic or inflammatory finding. Sinuses: Clear. Soft tissues: Negative. CT CERVICAL SPINE FINDINGS Alignment: Normal alignment of the cervical vertebral bodies. Skull base and vertebrae: Normal craniocervical junction. No loss of vertebral body height or disc height. Normal facet articulation. No evidence of fracture. Soft tissues and spinal canal: No prevertebral soft tissue swelling. No perispinal or epidural hematoma. Disc levels:  Bulky osteophytes from C4 to C7. Upper chest: Clear Other: None IMPRESSION: 1. No intracranial trauma. 2. Atrophy and white matter microvascular disease. 3. No facial bone fracture. 4. No cervical spine fracture. 5. Multilevel disc osteophytic disease C4-C7 Electronically Signed   By: Suzy Bouchard M.D.   On: 08/14/2021 18:56   CT Cervical Spine Wo Contrast  Result Date: 08/14/2021 CLINICAL DATA:  Head trauma.  Combative patient EXAM: CT HEAD WITHOUT CONTRAST CT MAXILLOFACIAL WITHOUT CONTRAST CT CERVICAL SPINE WITHOUT CONTRAST TECHNIQUE: Multidetector CT imaging of the head, cervical spine, and maxillofacial structures were performed using the standard protocol without  intravenous contrast. Multiplanar CT image reconstructions of the cervical spine and maxillofacial structures were also generated. COMPARISON:  None. FINDINGS: CT HEAD FINDINGS Brain: No acute intracranial hemorrhage. No focal mass lesion. No CT evidence of acute infarction. No midline shift or mass effect. No hydrocephalus. Basilar cisterns are patent. There are periventricular and subcortical white matter hypodensities. Generalized cortical atrophy. Vascular: No hyperdense vessel or unexpected calcification. Skull: Normal. Negative for fracture or focal lesion. Sinuses/Orbits: Paranasal sinuses and mastoid air cells are clear. Orbits are clear. Other: None. CT MAXILLOFACIAL FINDINGS Osseous: No fracture or mandibular dislocation. No destructive process. Orbits: Negative. No traumatic or inflammatory finding. Sinuses: Clear. Soft tissues: Negative. CT CERVICAL SPINE FINDINGS Alignment: Normal alignment of the cervical vertebral bodies. Skull base and vertebrae: Normal craniocervical junction. No loss of vertebral body height or disc height. Normal facet articulation. No evidence of fracture. Soft tissues and spinal canal: No prevertebral soft tissue swelling. No perispinal or epidural hematoma. Disc levels:  Bulky osteophytes from C4 to C7. Upper chest: Clear Other: None IMPRESSION: 1. No intracranial trauma. 2. Atrophy and white matter microvascular disease. 3. No facial bone fracture. 4. No cervical spine fracture. 5. Multilevel disc osteophytic disease C4-C7 Electronically Signed   By: Suzy Bouchard M.D.   On: 08/14/2021 18:56   DG FEMUR MIN 2 VIEWS LEFT  Result Date: 08/14/2021 CLINICAL DATA:  Confusion pain EXAM: LEFT FEMUR 2 VIEWS COMPARISON:  None. FINDINGS: There is no evidence of fracture  or other focal bone lesions. Soft tissues are unremarkable. IMPRESSION: Negative. Electronically Signed   By: Donavan Foil M.D.   On: 08/14/2021 18:55   CT Maxillofacial WO CM  Result Date:  08/14/2021 CLINICAL DATA:  Head trauma.  Combative patient EXAM: CT HEAD WITHOUT CONTRAST CT MAXILLOFACIAL WITHOUT CONTRAST CT CERVICAL SPINE WITHOUT CONTRAST TECHNIQUE: Multidetector CT imaging of the head, cervical spine, and maxillofacial structures were performed using the standard protocol without intravenous contrast. Multiplanar CT image reconstructions of the cervical spine and maxillofacial structures were also generated. COMPARISON:  None. FINDINGS: CT HEAD FINDINGS Brain: No acute intracranial hemorrhage. No focal mass lesion. No CT evidence of acute infarction. No midline shift or mass effect. No hydrocephalus. Basilar cisterns are patent. There are periventricular and subcortical white matter hypodensities. Generalized cortical atrophy. Vascular: No hyperdense vessel or unexpected calcification. Skull: Normal. Negative for fracture or focal lesion. Sinuses/Orbits: Paranasal sinuses and mastoid air cells are clear. Orbits are clear. Other: None. CT MAXILLOFACIAL FINDINGS Osseous: No fracture or mandibular dislocation. No destructive process. Orbits: Negative. No traumatic or inflammatory finding. Sinuses: Clear. Soft tissues: Negative. CT CERVICAL SPINE FINDINGS Alignment: Normal alignment of the cervical vertebral bodies. Skull base and vertebrae: Normal craniocervical junction. No loss of vertebral body height or disc height. Normal facet articulation. No evidence of fracture. Soft tissues and spinal canal: No prevertebral soft tissue swelling. No perispinal or epidural hematoma. Disc levels:  Bulky osteophytes from C4 to C7. Upper chest: Clear Other: None IMPRESSION: 1. No intracranial trauma. 2. Atrophy and white matter microvascular disease. 3. No facial bone fracture. 4. No cervical spine fracture. 5. Multilevel disc osteophytic disease C4-C7 Electronically Signed   By: Suzy Bouchard M.D.   On: 08/14/2021 18:56    Procedures Procedures   Medications Ordered in ED Medications - No data to  display  ED Course  I have reviewed the triage vital signs and the nursing notes.  Pertinent labs & imaging results that were available during my care of the patient were reviewed by me and considered in my medical decision making (see chart for details).    MDM Rules/Calculators/A&P                          Initial impression-presents for a fall, alert, no acute distress, vital signs reassuring.  Will obtain CT imaging of head face neck, will also add on pelvic x-ray as well as knee x-ray to slight tender my exam.  Work-up-CT head, maxillofacial, C-spine negative for acute findings, DG femur and pelvis both negative for acute findings.  EKG sinus without signs of ischemia.  UA shows red blood cells, white blood cells, many bacteria.  Rule out- low suspicion for intracranial head bleed, no loss of conscious,no focal deficits present on my exam, CT imaging negative for acute findings.  Low suspicion for spinal cord abnormality or spinal fracture spine was palpated was nontender to palpation, patient has full range of motion in the upper and lower extremities, will defer imaging at this time.  Low suspicion for pneumothorax as lung sounds are clear bilaterally, will defer imaging.  Low suspicion for intra-abdominal trauma as abdomen soft nontender to palpation.  Low suspicion for orthopedic injury as imaging is negative for acute findings.  I have low suspicion for [place] or kidney stones as abdomen is soft nontender to palpation, he is nontoxic-appearing, vital signs are reassuring.  He does have red blood cells white blood cells this is more consistent  with UTI we will culture urine and start him on antibiotics.  Lab work was deferred as he is at his baseline, nontoxic-appearing, no infectious etiology present.   Plan-  Fall-likely patient from muscular strains, will recommend over-the-counter pain medications follow-up PCP for further evaluation. UTI-UA consistent with UTI, will culture urine,  started on antibiotics, follow-up with PCP for further evaluation.  Vital signs have remained stable, no indication for hospital admission.  Patient discussed with attending and they agreed with assessment and plan.  Patient given at home care as well strict return precautions.  Patient verbalized that they understood agreed to said plan.  Final Clinical Impression(s) / ED Diagnoses Final diagnoses:  Fall, initial encounter  Acute cystitis with hematuria    Rx / DC Orders ED Discharge Orders          Ordered    cephALEXin (KEFLEX) 500 MG capsule  2 times daily,   Status:  Discontinued        08/14/21 2221    cephALEXin (KEFLEX) 500 MG capsule  2 times daily        08/14/21 2221             Marcello Fennel, PA-C 08/14/21 2222    Sherwood Gambler, MD 08/17/21 1526

## 2021-08-14 NOTE — ED Triage Notes (Signed)
Pt brought in by [facility] from [facility] with c/o fall today. Facility did not report any details of the fall to EMS. Pt reports he hurts all over from the fall. Pt cursing and combative with staff at time of triage.

## 2021-08-14 NOTE — ED Notes (Signed)
Pt transported to CT and x-ray

## 2021-08-14 NOTE — ED Notes (Signed)
In and out cath 375cc of cloudy urine returned

## 2021-08-14 NOTE — Discharge Instructions (Signed)
Imaging all reassuring, UA shows signs of UTI, started on antibiotics.  Please take as prescribed.  Follow-up with PCP in weeks time for reevaluation of his urine.  Come back to the emergency department if you develop chest pain, shortness of breath, severe abdominal pain, uncontrolled nausea, vomiting, diarrhea.

## 2021-08-14 NOTE — ED Notes (Signed)
Pt refusing to allow staff to take an oral or axillary temp

## 2021-08-15 NOTE — ED Notes (Signed)
Report called to [facility], patient awaiting transport back to facility.

## 2021-08-17 LAB — URINE CULTURE: Culture: >=100000 — AB

## 2021-08-18 ENCOUNTER — Telehealth: Payer: Self-pay

## 2021-08-18 NOTE — Telephone Encounter (Signed)
Post ED Visit - Positive Culture Follow-up: Successful Patient Follow-Up  Culture assessed and recommendations reviewed by:  []  Elenor Quinones, Pharm.D. []  Heide Guile, Pharm.D., BCPS AQ-ID []  Parks Neptune, Pharm.D., BCPS []  Alycia Rossetti, Pharm.D., BCPS []  [place], Pharm.D., BCPS, AAHIVP []  Legrand Como, Pharm.D., BCPS, AAHIVP []  Salome Arnt, PharmD, BCPS []  Johnnette Gourd, PharmD, BCPS []  Hughes Better, PharmD, BCPS []  Leeroy Cha, PharmD  Positive urine culture  []  Patient discharged without antimicrobial prescription and treatment is now indicated [x]  Organism is resistant to prescribed ED discharge antimicrobial []  Patient with positive blood cultures  Changes discussed with ED provider: Suella Broad PA-C New antibiotic prescription Amoxicillin 500 mg po capsule every 8 hours for 5 days  Called to [facility] spoke with Dedra Skeens the [place], date 08/18/2021, time 11:35 am   Glennon Hamilton 08/18/2021, 11:37 AM

## 2021-08-18 NOTE — Progress Notes (Signed)
ED Antimicrobial Stewardship Positive Culture Follow Up   Rick Cruz is an 75 y.o. male who presented to [facility] on 08/14/2021 with a chief complaint of  Chief Complaint  Patient presents with   Fall    Recent Results (from the past 720 hour(s))  Resp Panel by RT-PCR (Flu A&B, Covid) Nasopharyngeal Swab     Status: None   Collection Time: 08/14/21  9:15 PM   Specimen: Nasopharyngeal Swab; Nasopharyngeal(NP) swabs in vial transport medium  Result Value Ref Range Status   SARS Coronavirus 2 by RT PCR NEGATIVE NEGATIVE Final    Comment: (NOTE) SARS-CoV-2 target nucleic acids are NOT DETECTED.  The SARS-CoV-2 RNA is generally detectable in upper respiratory specimens during the acute phase of infection. The lowest concentration of SARS-CoV-2 viral copies this assay can detect is 138 copies/mL. A negative result does not preclude SARS-Cov-2 infection and should not be used as the sole basis for treatment or other patient management decisions. A negative result may occur with  improper specimen collection/handling, submission of specimen other than nasopharyngeal swab, presence of viral mutation(s) within the areas targeted by this assay, and inadequate number of viral copies(<138 copies/mL). A negative result must be combined with clinical observations, patient history, and epidemiological information. The expected result is Negative.  Fact Sheet for Patients:  EntrepreneurPulse.com.au  Fact Sheet for Healthcare Providers:  IncredibleEmployment.be  This test is no t yet approved or cleared by the [place] FDA and  has been authorized for detection and/or diagnosis of SARS-CoV-2 by FDA under an Emergency Use Authorization (EUA). This EUA will remain  in effect (meaning this test can be used) for the duration of the COVID-19 declaration under Section 564(b)(1) of the Act, 21 U.S.C.section 360bbb-3(b)(1), unless the authorization is  terminated  or revoked sooner.       Influenza A by PCR NEGATIVE NEGATIVE Final   Influenza B by PCR NEGATIVE NEGATIVE Final    Comment: (NOTE) The Xpert Xpress SARS-CoV-2/FLU/RSV plus assay is intended as an aid in the diagnosis of influenza from Nasopharyngeal swab specimens and should not be used as a sole basis for treatment. Nasal washings and aspirates are unacceptable for Xpert Xpress SARS-CoV-2/FLU/RSV testing.  Fact Sheet for Patients: EntrepreneurPulse.com.au  Fact Sheet for Healthcare Providers: IncredibleEmployment.be  This test is not yet approved or cleared by the [place] FDA and has been authorized for detection and/or diagnosis of SARS-CoV-2 by FDA under an Emergency Use Authorization (EUA). This EUA will remain in effect (meaning this test can be used) for the duration of the COVID-19 declaration under Section 564(b)(1) of the Act, 21 U.S.C. section 360bbb-3(b)(1), unless the authorization is terminated or revoked.  Performed at [facility], [address], [place], [place] [address]   Urine Culture     Status: Abnormal   Collection Time: 08/14/21 10:21 PM   Specimen: Urine, Clean Catch  Result Value Ref Range Status   Specimen Description   Final    URINE, CLEAN CATCH Performed at [facility], [address] [place], [place] [address]    Special Requests   Final    NONE Performed at [facility], [address] [place], [place] [address]    Culture >=100,000 COLONIES/mL ENTEROCOCCUS FAECALIS (A)  Final   Report Status 08/17/2021 FINAL  Final   Organism ID, Bacteria ENTEROCOCCUS FAECALIS (A)  Final      Susceptibility   Enterococcus faecalis - MIC*    AMPICILLIN <=2 SENSITIVE Sensitive  NITROFURANTOIN <=16 SENSITIVE Sensitive     VANCOMYCIN 1 SENSITIVE Sensitive     * >=100,000 COLONIES/mL ENTEROCOCCUS FAECALIS    [x]  Treated with keflex, organism resistant to prescribed antimicrobial   New  antibiotic prescription: Amoxil  ED Provider: Suella Broad, PA-C   Bertis Ruddy 08/18/2021, 11:03 AM Clinical Pharmacist Monday - Friday phone -  [phone] Saturday - Sunday phone - [phone]

## 2021-11-02 ENCOUNTER — Emergency Department (HOSPITAL_COMMUNITY): Payer: Medicare Other

## 2021-11-02 ENCOUNTER — Encounter (HOSPITAL_COMMUNITY): Payer: Self-pay

## 2021-11-02 ENCOUNTER — Inpatient Hospital Stay (HOSPITAL_COMMUNITY)
Admission: EM | Admit: 2021-11-02 | Discharge: 2021-11-15 | DRG: 070 | Disposition: E | Payer: Medicare Other | Source: Skilled Nursing Facility | Attending: Internal Medicine | Admitting: Internal Medicine

## 2021-11-02 ENCOUNTER — Other Ambulatory Visit: Payer: Self-pay

## 2021-11-02 DIAGNOSIS — Z7985 Long-term (current) use of injectable non-insulin antidiabetic drugs: Secondary | ICD-10-CM

## 2021-11-02 DIAGNOSIS — G9341 Metabolic encephalopathy: Principal | ICD-10-CM | POA: Diagnosis present

## 2021-11-02 DIAGNOSIS — D509 Iron deficiency anemia, unspecified: Secondary | ICD-10-CM | POA: Diagnosis present

## 2021-11-02 DIAGNOSIS — A419 Sepsis, unspecified organism: Secondary | ICD-10-CM | POA: Diagnosis not present

## 2021-11-02 DIAGNOSIS — R739 Hyperglycemia, unspecified: Secondary | ICD-10-CM

## 2021-11-02 DIAGNOSIS — J9622 Acute and chronic respiratory failure with hypercapnia: Secondary | ICD-10-CM | POA: Diagnosis present

## 2021-11-02 DIAGNOSIS — E1140 Type 2 diabetes mellitus with diabetic neuropathy, unspecified: Secondary | ICD-10-CM | POA: Diagnosis present

## 2021-11-02 DIAGNOSIS — J690 Pneumonitis due to inhalation of food and vomit: Secondary | ICD-10-CM | POA: Diagnosis not present

## 2021-11-02 DIAGNOSIS — Z20822 Contact with and (suspected) exposure to covid-19: Secondary | ICD-10-CM | POA: Diagnosis present

## 2021-11-02 DIAGNOSIS — Z66 Do not resuscitate: Secondary | ICD-10-CM | POA: Diagnosis not present

## 2021-11-02 DIAGNOSIS — E870 Hyperosmolality and hypernatremia: Secondary | ICD-10-CM | POA: Diagnosis present

## 2021-11-02 DIAGNOSIS — K810 Acute cholecystitis: Secondary | ICD-10-CM | POA: Diagnosis present

## 2021-11-02 DIAGNOSIS — N390 Urinary tract infection, site not specified: Secondary | ICD-10-CM | POA: Diagnosis present

## 2021-11-02 DIAGNOSIS — G8929 Other chronic pain: Secondary | ICD-10-CM | POA: Diagnosis present

## 2021-11-02 DIAGNOSIS — J9612 Chronic respiratory failure with hypercapnia: Secondary | ICD-10-CM

## 2021-11-02 DIAGNOSIS — R6520 Severe sepsis without septic shock: Secondary | ICD-10-CM | POA: Diagnosis present

## 2021-11-02 DIAGNOSIS — Z0189 Encounter for other specified special examinations: Secondary | ICD-10-CM

## 2021-11-02 DIAGNOSIS — Z09 Encounter for follow-up examination after completed treatment for conditions other than malignant neoplasm: Secondary | ICD-10-CM

## 2021-11-02 DIAGNOSIS — E1165 Type 2 diabetes mellitus with hyperglycemia: Secondary | ICD-10-CM | POA: Diagnosis present

## 2021-11-02 DIAGNOSIS — F329 Major depressive disorder, single episode, unspecified: Secondary | ICD-10-CM | POA: Diagnosis present

## 2021-11-02 DIAGNOSIS — I5032 Chronic diastolic (congestive) heart failure: Secondary | ICD-10-CM | POA: Diagnosis present

## 2021-11-02 DIAGNOSIS — J9611 Chronic respiratory failure with hypoxia: Secondary | ICD-10-CM

## 2021-11-02 DIAGNOSIS — I639 Cerebral infarction, unspecified: Secondary | ICD-10-CM | POA: Diagnosis present

## 2021-11-02 DIAGNOSIS — I480 Paroxysmal atrial fibrillation: Secondary | ICD-10-CM | POA: Diagnosis not present

## 2021-11-02 DIAGNOSIS — E871 Hypo-osmolality and hyponatremia: Secondary | ICD-10-CM | POA: Diagnosis not present

## 2021-11-02 DIAGNOSIS — K219 Gastro-esophageal reflux disease without esophagitis: Secondary | ICD-10-CM | POA: Diagnosis present

## 2021-11-02 DIAGNOSIS — F200 Paranoid schizophrenia: Secondary | ICD-10-CM | POA: Diagnosis present

## 2021-11-02 DIAGNOSIS — I468 Cardiac arrest due to other underlying condition: Secondary | ICD-10-CM | POA: Diagnosis not present

## 2021-11-02 DIAGNOSIS — L89301 Pressure ulcer of unspecified buttock, stage 1: Secondary | ICD-10-CM | POA: Diagnosis not present

## 2021-11-02 DIAGNOSIS — Z87891 Personal history of nicotine dependence: Secondary | ICD-10-CM

## 2021-11-02 DIAGNOSIS — I4510 Unspecified right bundle-branch block: Secondary | ICD-10-CM | POA: Diagnosis present

## 2021-11-02 DIAGNOSIS — G931 Anoxic brain damage, not elsewhere classified: Secondary | ICD-10-CM | POA: Diagnosis not present

## 2021-11-02 DIAGNOSIS — G9340 Encephalopathy, unspecified: Secondary | ICD-10-CM

## 2021-11-02 DIAGNOSIS — H5461 Unqualified visual loss, right eye, normal vision left eye: Secondary | ICD-10-CM | POA: Diagnosis present

## 2021-11-02 DIAGNOSIS — N179 Acute kidney failure, unspecified: Secondary | ICD-10-CM | POA: Diagnosis present

## 2021-11-02 DIAGNOSIS — J9621 Acute and chronic respiratory failure with hypoxia: Secondary | ICD-10-CM | POA: Diagnosis not present

## 2021-11-02 DIAGNOSIS — E119 Type 2 diabetes mellitus without complications: Secondary | ICD-10-CM | POA: Diagnosis not present

## 2021-11-02 DIAGNOSIS — I2510 Atherosclerotic heart disease of native coronary artery without angina pectoris: Secondary | ICD-10-CM

## 2021-11-02 DIAGNOSIS — Z8673 Personal history of transient ischemic attack (TIA), and cerebral infarction without residual deficits: Secondary | ICD-10-CM

## 2021-11-02 DIAGNOSIS — J449 Chronic obstructive pulmonary disease, unspecified: Secondary | ICD-10-CM | POA: Diagnosis present

## 2021-11-02 DIAGNOSIS — I10 Essential (primary) hypertension: Secondary | ICD-10-CM | POA: Diagnosis not present

## 2021-11-02 DIAGNOSIS — Z794 Long term (current) use of insulin: Secondary | ICD-10-CM | POA: Diagnosis not present

## 2021-11-02 DIAGNOSIS — R0600 Dyspnea, unspecified: Secondary | ICD-10-CM

## 2021-11-02 DIAGNOSIS — Z888 Allergy status to other drugs, medicaments and biological substances status: Secondary | ICD-10-CM

## 2021-11-02 DIAGNOSIS — Z515 Encounter for palliative care: Secondary | ICD-10-CM

## 2021-11-02 DIAGNOSIS — I69398 Other sequelae of cerebral infarction: Secondary | ICD-10-CM | POA: Diagnosis not present

## 2021-11-02 DIAGNOSIS — G4733 Obstructive sleep apnea (adult) (pediatric): Secondary | ICD-10-CM | POA: Diagnosis present

## 2021-11-02 DIAGNOSIS — Z6831 Body mass index (BMI) 31.0-31.9, adult: Secondary | ICD-10-CM

## 2021-11-02 DIAGNOSIS — I110 Hypertensive heart disease with heart failure: Secondary | ICD-10-CM | POA: Diagnosis present

## 2021-11-02 DIAGNOSIS — Z91199 Patient's noncompliance with other medical treatment and regimen due to unspecified reason: Secondary | ICD-10-CM

## 2021-11-02 DIAGNOSIS — E669 Obesity, unspecified: Secondary | ICD-10-CM | POA: Diagnosis present

## 2021-11-02 DIAGNOSIS — Z7901 Long term (current) use of anticoagulants: Secondary | ICD-10-CM

## 2021-11-02 DIAGNOSIS — E7800 Pure hypercholesterolemia, unspecified: Secondary | ICD-10-CM | POA: Diagnosis present

## 2021-11-02 DIAGNOSIS — Z9981 Dependence on supplemental oxygen: Secondary | ICD-10-CM

## 2021-11-02 DIAGNOSIS — L89312 Pressure ulcer of right buttock, stage 2: Secondary | ICD-10-CM | POA: Diagnosis present

## 2021-11-02 DIAGNOSIS — Z7189 Other specified counseling: Secondary | ICD-10-CM | POA: Diagnosis not present

## 2021-11-02 DIAGNOSIS — J9610 Chronic respiratory failure, unspecified whether with hypoxia or hypercapnia: Secondary | ICD-10-CM | POA: Diagnosis present

## 2021-11-02 DIAGNOSIS — L8990 Pressure ulcer of unspecified site, unspecified stage: Secondary | ICD-10-CM | POA: Diagnosis present

## 2021-11-02 DIAGNOSIS — Z79899 Other long term (current) drug therapy: Secondary | ICD-10-CM

## 2021-11-02 DIAGNOSIS — Z7982 Long term (current) use of aspirin: Secondary | ICD-10-CM

## 2021-11-02 DIAGNOSIS — E860 Dehydration: Secondary | ICD-10-CM | POA: Diagnosis present

## 2021-11-02 LAB — HEMOGLOBIN A1C
Hgb A1c MFr Bld: 9.4 % — ABNORMAL HIGH (ref 4.8–5.6)
Mean Plasma Glucose: 223.08 mg/dL

## 2021-11-02 LAB — URINALYSIS, ROUTINE W REFLEX MICROSCOPIC
Bilirubin Urine: NEGATIVE
Glucose, UA: >=500 mg/dL — AB
Ketones, ur: 5 mg/dL — AB
Nitrite: NEGATIVE
Protein, ur: 100 mg/dL — AB
Specific Gravity, Urine: 1.017 (ref 1.005–1.030)
WBC, UA: >50 WBC/hpf — ABNORMAL HIGH (ref 0–5)
pH: 5 (ref 5.0–8.0)

## 2021-11-02 LAB — CBC WITH DIFFERENTIAL/PLATELET
Abs Immature Granulocytes: 0.04 10*3/uL (ref 0.00–0.07)
Basophils Absolute: 0.1 10*3/uL (ref 0.0–0.1)
Basophils Relative: 1 %
Eosinophils Absolute: 0.1 10*3/uL (ref 0.0–0.5)
Eosinophils Relative: 1 %
HCT: 36 % — ABNORMAL LOW (ref 39.0–52.0)
Hemoglobin: 10.1 g/dL — ABNORMAL LOW (ref 13.0–17.0)
Immature Granulocytes: 0 %
Lymphocytes Relative: 17 %
Lymphs Abs: 1.8 10*3/uL (ref 0.7–4.0)
MCH: 22.8 pg — ABNORMAL LOW (ref 26.0–34.0)
MCHC: 28.1 g/dL — ABNORMAL LOW (ref 30.0–36.0)
MCV: 81.3 fL (ref 80.0–100.0)
Monocytes Absolute: 0.7 10*3/uL (ref 0.1–1.0)
Monocytes Relative: 6 %
Neutro Abs: 8.4 10*3/uL — ABNORMAL HIGH (ref 1.7–7.7)
Neutrophils Relative %: 75 %
Platelets: 205 10*3/uL (ref 150–400)
RBC: 4.43 MIL/uL (ref 4.22–5.81)
RDW: 15.6 % — ABNORMAL HIGH (ref 11.5–15.5)
WBC: 11.1 10*3/uL — ABNORMAL HIGH (ref 4.0–10.5)
nRBC: 0 % (ref 0.0–0.2)

## 2021-11-02 LAB — GLUCOSE, CAPILLARY: Glucose-Capillary: 351 mg/dL — ABNORMAL HIGH (ref 70–99)

## 2021-11-02 LAB — COMPREHENSIVE METABOLIC PANEL
ALT: 14 U/L (ref 0–44)
AST: 13 U/L — ABNORMAL LOW (ref 15–41)
Albumin: 3.8 g/dL (ref 3.5–5.0)
Alkaline Phosphatase: 96 U/L (ref 38–126)
Anion gap: 12 (ref 5–15)
BUN: 27 mg/dL — ABNORMAL HIGH (ref 8–23)
CO2: 27 mmol/L (ref 22–32)
Calcium: 9.8 mg/dL (ref 8.9–10.3)
Chloride: 111 mmol/L (ref 98–111)
Creatinine, Ser: 1.41 mg/dL — ABNORMAL HIGH (ref 0.61–1.24)
GFR, Estimated: 52 mL/min — ABNORMAL LOW (ref >60)
Glucose, Bld: 496 mg/dL — ABNORMAL HIGH (ref 70–99)
Potassium: 4.4 mmol/L (ref 3.5–5.1)
Sodium: 150 mmol/L — ABNORMAL HIGH (ref 135–145)
Total Bilirubin: 0.6 mg/dL (ref 0.3–1.2)
Total Protein: 8.3 g/dL — ABNORMAL HIGH (ref 6.5–8.1)

## 2021-11-02 LAB — CBG MONITORING, ED
Glucose-Capillary: 455 mg/dL — ABNORMAL HIGH (ref 70–99)
Glucose-Capillary: 346 mg/dL — ABNORMAL HIGH (ref 70–99)

## 2021-11-02 LAB — BLOOD GAS, VENOUS
Acid-Base Excess: 9.2 mmol/L — ABNORMAL HIGH (ref 0.0–2.0)
Bicarbonate: 33.5 mmol/L — ABNORMAL HIGH (ref 20.0–28.0)
FIO2: 32 %
O2 Saturation: 99.2 %
Patient temperature: 37.2
pCO2, Ven: 44 mmHg (ref 44–60)
pH, Ven: 7.49 — ABNORMAL HIGH (ref 7.25–7.43)
pO2, Ven: 114 mmHg — ABNORMAL HIGH (ref 32–45)

## 2021-11-02 LAB — RESP PANEL BY RT-PCR (FLU A&B, COVID) ARPGX2
Influenza A by PCR: NEGATIVE
Influenza B by PCR: NEGATIVE
SARS Coronavirus 2 by RT PCR: NEGATIVE

## 2021-11-02 LAB — BETA-HYDROXYBUTYRIC ACID: Beta-Hydroxybutyric Acid: 0.63 mmol/L — ABNORMAL HIGH (ref 0.05–0.27)

## 2021-11-02 LAB — TROPONIN I (HIGH SENSITIVITY)
Troponin I (High Sensitivity): 12 ng/L (ref <18)
Troponin I (High Sensitivity): 16 ng/L (ref <18)

## 2021-11-02 MED ORDER — ACETAMINOPHEN 325 MG PO TABS
650.0000 mg | ORAL_TABLET | Freq: Four times a day (QID) | ORAL | Status: DC | PRN
  Administered 2021-11-04 – 2021-11-06 (×2): 650 mg via ORAL
  Filled 2021-11-02 (×2): qty 2

## 2021-11-02 MED ORDER — METOPROLOL TARTRATE 25 MG PO TABS
25.0000 mg | ORAL_TABLET | Freq: Two times a day (BID) | ORAL | Status: DC
  Administered 2021-11-02 – 2021-11-05 (×7): 25 mg via ORAL
  Filled 2021-11-02 (×7): qty 1

## 2021-11-02 MED ORDER — APIXABAN 5 MG PO TABS
5.0000 mg | ORAL_TABLET | Freq: Two times a day (BID) | ORAL | Status: DC
  Administered 2021-11-02 – 2021-11-05 (×7): 5 mg via ORAL
  Filled 2021-11-02 (×7): qty 1

## 2021-11-02 MED ORDER — LACTATED RINGERS IV BOLUS
1000.0000 mL | Freq: Once | INTRAVENOUS | Status: AC
  Administered 2021-11-02: 1000 mL via INTRAVENOUS

## 2021-11-02 MED ORDER — MELATONIN 3 MG PO TABS
6.0000 mg | ORAL_TABLET | Freq: Every day | ORAL | Status: DC
  Administered 2021-11-02 – 2021-11-04 (×3): 6 mg via ORAL
  Filled 2021-11-02 (×3): qty 2

## 2021-11-02 MED ORDER — LACTATED RINGERS IV SOLN
INTRAVENOUS | Status: DC
Start: 2021-11-02 — End: 2021-11-03

## 2021-11-02 MED ORDER — ASPIRIN EC 81 MG PO TBEC
81.0000 mg | DELAYED_RELEASE_TABLET | Freq: Every day | ORAL | Status: DC
  Administered 2021-11-02 – 2021-11-05 (×4): 81 mg via ORAL
  Filled 2021-11-02 (×4): qty 1

## 2021-11-02 MED ORDER — HYDROCORTISONE 1 % EX CREA
TOPICAL_CREAM | Freq: Two times a day (BID) | CUTANEOUS | Status: DC
  Administered 2021-11-04: 1 via TOPICAL
  Filled 2021-11-02: qty 28

## 2021-11-02 MED ORDER — INSULIN DETEMIR 100 UNIT/ML ~~LOC~~ SOLN
20.0000 [IU] | Freq: Every day | SUBCUTANEOUS | Status: DC
  Administered 2021-11-02 – 2021-11-04 (×3): 20 [IU] via SUBCUTANEOUS
  Filled 2021-11-02 (×4): qty 0.2

## 2021-11-02 MED ORDER — LACTATED RINGERS IV BOLUS
250.0000 mL | Freq: Once | INTRAVENOUS | Status: AC
  Administered 2021-11-02: 250 mL via INTRAVENOUS

## 2021-11-02 MED ORDER — INSULIN ASPART 100 UNIT/ML IJ SOLN: 15.0000 [IU] | Freq: Once | INTRAMUSCULAR | Status: DC

## 2021-11-02 MED ORDER — PANTOPRAZOLE SODIUM 40 MG PO TBEC
40.0000 mg | DELAYED_RELEASE_TABLET | Freq: Every day | ORAL | Status: DC
  Administered 2021-11-02 – 2021-11-05 (×4): 40 mg via ORAL
  Filled 2021-11-02 (×4): qty 1

## 2021-11-02 MED ORDER — INSULIN ASPART 100 UNIT/ML IJ SOLN
0.0000 [IU] | Freq: Every day | INTRAMUSCULAR | Status: DC
  Administered 2021-11-02: 5 [IU] via SUBCUTANEOUS
  Administered 2021-11-03 – 2021-11-04 (×2): 3 [IU] via SUBCUTANEOUS
  Administered 2021-11-05: 2 [IU] via SUBCUTANEOUS

## 2021-11-02 MED ORDER — INSULIN ASPART 100 UNIT/ML IJ SOLN
10.0000 [IU] | Freq: Once | INTRAMUSCULAR | Status: AC
  Administered 2021-11-02: 10 [IU] via SUBCUTANEOUS
  Filled 2021-11-02: qty 1

## 2021-11-02 MED ORDER — DIPHENHYDRAMINE HCL 50 MG/ML IJ SOLN
25.0000 mg | Freq: Once | INTRAMUSCULAR | Status: AC
  Administered 2021-11-02: 25 mg via INTRAVENOUS
  Filled 2021-11-02: qty 1

## 2021-11-02 MED ORDER — LORAZEPAM 1 MG PO TABS
1.0000 mg | ORAL_TABLET | Freq: Once | ORAL | Status: AC
Start: 2021-11-02 — End: 2021-11-02
  Administered 2021-11-02: 1 mg via ORAL
  Filled 2021-11-02: qty 1

## 2021-11-02 MED ORDER — INSULIN ASPART 100 UNIT/ML IJ SOLN
0.0000 [IU] | Freq: Three times a day (TID) | INTRAMUSCULAR | Status: DC
  Administered 2021-11-03: 5 [IU] via SUBCUTANEOUS
  Administered 2021-11-03 – 2021-11-04 (×3): 11 [IU] via SUBCUTANEOUS
  Administered 2021-11-04: 8 [IU] via SUBCUTANEOUS
  Administered 2021-11-04: 5 [IU] via SUBCUTANEOUS
  Administered 2021-11-05: 15 [IU] via SUBCUTANEOUS
  Administered 2021-11-05: 5 [IU] via SUBCUTANEOUS
  Administered 2021-11-05: 11 [IU] via SUBCUTANEOUS
  Administered 2021-11-06 (×2): 15 [IU] via SUBCUTANEOUS

## 2021-11-02 NOTE — ED Notes (Signed)
Patient transported to CT 

## 2021-11-02 NOTE — ED Provider Notes (Signed)
Signout from Dr. Matilde Cruz.  76 year old male history of schizophrenia presenting from rehab for elevated heart rate.  Blood sugar elevated.  Patient unable to give any significant history.  Found to be hyperglycemic with AKI and hyper natremia.  Head CT not showing any acute findings.  Patient needs IV fluids insulin and admission for stabilization. Physical Exam  BP (!) 162/92    Pulse (!) 115    Temp 98.9 F (37.2 C)    Resp 18    Ht 6' (1.829 m)    Wt 104.3 kg    SpO2 100%    BMI 31.19 kg/m   Physical Exam  Procedures  Procedures Elderly patient in no distress.  He arouses to voice follows some commands. ED Course / MDM    Medical Decision Making Amount and/or Complexity of Data Reviewed Labs: ordered. Radiology: ordered.  Risk Prescription drug management. Decision regarding hospitalization.  Patient's head CT does not show any acute findings.  Urinalysis without clear signs of infection.  Will need admission to the hospital for management of his encephalopathy hypernatremia AKI and elevated blood sugars.  Discussed with Dr. Waldron [facility] hospitalist who will evaluate the patient for admission.        Rick Rasmussen, MD 11/03/21 1030

## 2021-11-02 NOTE — ED Notes (Signed)
Also pt was given socks at this time pt's wife took clothes . Amiee Wiley

## 2021-11-02 NOTE — ED Triage Notes (Signed)
Per EMS called out for increased HR. Patient wearing oxygen on arrival to [place]. Patient 123 hR in triage. EMS took patient's blood sugar and meter read high. Patient alert & oriented X2. Cbg 455 in triage.

## 2021-11-02 NOTE — ED Notes (Signed)
Attempt to draw second troponin x2 unsuccessful, pt pulling arm due to discomfort.  Will attempt again in 10 mins

## 2021-11-02 NOTE — H&P (Addendum)
TRH H&P   Patient Demographics:    Rick Cruz, is a 76 y.o. male  MRN: [id number]   DOB - Sep 21, 1945  Admit Date - 10/19/2021  Outpatient Primary MD for the patient is Hal Morales, DO  Referring MD/NP/PA: Dr Melina Copa  Patient coming from: [facility]   Chief Complaint  Patient presents with   Hyperglycemia      HPI:    Rick Cruz  is a 76 y.o. male, with medical history significant of osteoarthritis, history of other nonhemorrhagic stroke, right eye blindness, chronic diastolic heart failure, chronic respiratory failure due to COPD on home oxygen, sleep apnea noncompliant with BiPAP, history of tobacco abuse, type II DM, hyperlipidemia, paroxysmal A-fib, hypertension, history of unspecified schizophrenia who presents from [place] [facility] due to AMS. -Patient was sent from [facility] due to altered mental status, as well facility noted him to be hypertensive, and elevated CBG, patient was confused, most of the history was provided by ED staff and wife at bedside, per wife she thinks her husband taking too much psych medications which make him more sleepy, but she does not altered mental status has been progressive over the last week, where he was not interactive as usual, poor appetite, she did not note him having any focal deficits. -ED patient was noted to be tachycardic in the 120s, work-up significant for sodium of 150, glucose of 496, creatinine of 1.4, CT head with no acute findings, negative COVID and flu virus, patient started on IV fluids and [facility] hospitalist consulted to admit.   Review of systems:    Patient history unreliable due to altered mental status.  With Past History of the following :    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   CHF (congestive heart failure) (HCC)    diastolic    Chronic pain     COPD (chronic obstructive pulmonary disease) (HCC)    Depression    Diabetes mellitus without complication (HCC)    insulin pump   GERD (gastroesophageal reflux disease)    Hypercholesterolemia    Hypertension    MDD (major depressive disorder)    On home O2    3 liters   Oxygen deficiency    Paroxysmal atrial fibrillation ([place])    Schizophrenia ([place])    Sleep apnea    noncompliant with BiPAP   Sleep apnea    Stroke [facility])    Tobacco abuse       Past Surgical History:  Procedure Laterality Date   COLON SURGERY  March 2010   secondary to large colon polyp, final path per discharge summary notes tubulovillous adenoma.    COLONOSCOPY  July 2011   Dr. [place]: multiple polyps, internal and external hemorrhoids, diverticulosis. tubular adenoma   ESOPHAGOGASTRODUODENOSCOPY (EGD) WITH PROPOFOL N/A 04/20/2013   Procedure: ESOPHAGOGASTRODUODENOSCOPY (EGD)  WITH PROPOFOL;  Surgeon: Daneil Dolin, MD;  Location: [facility];  Service: Endoscopy;  Laterality: N/A;   KNEE SURGERY     X2      Social History:     Social History   Tobacco Use   Smoking status: Former    Packs/day: 1.00    Years: 50.00    Pack years: 50.00    Types: Cigarettes    Quit date: 09/16/2013    Years since quitting: 8.1   Smokeless tobacco: Never  Substance Use Topics   Alcohol use: No    Comment: history of ETOH abuse in remote past, none in at least 10 years       Family History :     Family History  Problem Relation Age of Onset   Thyroid disease Mother    Parkinson's disease Father    Parkinson's disease Other    Colon cancer Neg Hx       Home Medications:   Prior to Admission medications   Medication Sig Start Date End Date Taking? Authorizing Provider  acetaminophen (TYLENOL) 325 MG tablet Take 2 tablets (650 mg total) by mouth every 6 (six) hours as needed for mild pain, fever or headache (or Fever >/= 101). 02/22/20  Yes Emokpae, Courage, MD  albuterol (PROVENTIL) (2.5 MG/3ML) 0.083%  nebulizer solution Take 3 mLs (2.5 mg total) by nebulization every 6 (six) hours as needed for wheezing or shortness of breath. 02/22/20  Yes Emokpae, Courage, MD  apixaban (ELIQUIS) 5 MG TABS tablet Take 1 tablet (5 mg total) by mouth 2 (two) times daily. 02/22/20  Yes Emokpae, Courage, MD  aspirin EC 81 MG tablet Take 81 mg by mouth daily. Swallow whole.   Yes [provider]  cholecalciferol (VITAMIN D) 1000 UNITS tablet Take 1,000 Units by mouth 2 (two) times daily.   Yes [provider]  Cranberry 450 MG CAPS Take 1 capsule by mouth daily.   Yes [provider]  diclofenac Sodium (VOLTAREN) 1 % GEL Apply 2 g topically 4 (four) times daily.   Yes [provider]  docusate sodium (COLACE) 100 MG capsule Take 100 mg by mouth 2 (two) times daily.   Yes [provider]  DULoxetine (CYMBALTA) 30 MG capsule Take 1 capsule (30 mg total) by mouth every morning. 02/22/20  Yes Emokpae, Courage, MD  escitalopram (LEXAPRO) 10 MG tablet Take 10 mg by mouth daily.   Yes [provider]  Fluticasone-Umeclidin-Vilant (TRELEGY ELLIPTA) 100-62.5-25 MCG/INH AEPB Inhale 1 puff into the lungs daily. 02/22/20  Yes Emokpae, Courage, MD  gabapentin (NEURONTIN) 300 MG capsule Take 1 capsule (300 mg total) by mouth at bedtime. 10/18/20  Yes Vann, Jessica U, DO  insulin glargine (LANTUS SOLOSTAR) 100 UNIT/ML Solostar Pen Inject 20 Units into the skin at bedtime. 10/18/20  Yes Vann, Jessica U, DO  melatonin 3 MG TABS tablet Take 6 mg by mouth at bedtime.   Yes [provider]  memantine (NAMENDA XR) 28 MG CP24 24 hr capsule Take 28 mg by mouth daily.   Yes [provider]  metoprolol tartrate (LOPRESSOR) 25 MG tablet Take 1 tablet (25 mg total) by mouth 2 (two) times daily. 10/18/20  Yes Geradine Girt, DO  Multiple Vitamins-Minerals (OCUVITE EXTRA) TABS Take 1 tablet by mouth daily.   Yes [provider]  omeprazole (PRILOSEC) 20 MG capsule Take 20 mg by  mouth 2 (two) times daily before a meal.   Yes [provider]  OXYGEN Inhale 2 L into the lungs continuous.   Yes [provider]  Phenylephrine-DM-GG (GUIAFEN II DM PO) Take 400 mg by mouth 3 (three) times daily.   Yes [provider]  QUEtiapine (SEROQUEL) 25 MG tablet Take 1 tablet (25 mg total) by mouth 2 (two) times daily. Patient taking differently: Take 50 mg by mouth 2 (two) times daily. 12/18/20  Yes Barton Dubois, MD  TRULICITY 3 YF/7.4BS SOPN Inject 3 mg into the skin See admin instructions. Every Tuesday 06/07/20  Yes [provider]  azithromycin (ZITHROMAX) 250 MG tablet Take 1 tablet (250 mg total) by mouth daily. Take first 2 tablets together, then 1 every day until finished. Patient not taking: Reported on 08/14/2021 03/02/21   Mesner, Corene Cornea, MD  hydrOXYzine (ATARAX/VISTARIL) 25 MG tablet Take 1 tablet (25 mg total) by mouth every 12 (twelve) hours as needed for anxiety or itching (Anxiety and insomnia or restlessness). Patient not taking: Reported on 11/12/2021 02/22/20   Roxan Hockey, MD  insulin aspart (NOVOLOG) 100 UNIT/ML injection Inject 0-15 Units into the skin 3 (three) times daily with meals. Patient not taking: Reported on 08/14/2021 10/18/20   Geradine Girt, DO  tiotropium (SPIRIVA) 18 MCG inhalation capsule Place 1 capsule (18 mcg total) into inhaler and inhale daily. Patient not taking: Reported on 08/14/2021 02/22/20   Roxan Hockey, MD  triamcinolone cream (KENALOG) 0.1 % Apply 1 application topically 2 (two) times daily. Patient not taking: Reported on 08/14/2021 02/11/20   Emerson Monte, FNP     Allergies:     Allergies  Allergen Reactions   Phenergan [Promethazine Hcl] Other (See Comments)    Becomes very confused and aggressive   Haldol [Haloperidol Lactate] Other (See Comments)    Shaking    Metformin And Related Diarrhea     Physical Exam:   Vitals  Blood pressure (!) 126/96, pulse 98, temperature 98.1 F  (36.7 C), temperature source Oral, resp. rate 14, height 6' (1.829 m), weight 104.3 kg, SpO2 100 %.   1. General frail elderly male, laying in bed in no apparent distress  2.  Minute cognition and insight, he is sleepy, but wakes up to verbal stimuli, answer some questions appropriately, follows some commands .  3.  No Palpable Lymph Nodes in Neck or Axillae  4. Conjunctivae clear, dry oral Mucosa.  5. Supple Neck, No JVD, No cervical lymphadenopathy appriciated, No Carotid Bruits.  6. Symmetrical Chest wall movement, Good air movement bilaterally, CTAB.  7. RRR, No Gallops, Rubs or Murmurs, No Parasternal Heave.  8. Positive Bowel Sounds, Abdomen Soft, mild suprapubic tenderness, No organomegaly appriciated,No rebound -guarding or rigidity.  9.  No Cyanosis, delayed skin Turgor, No Skin Rash or Bruise.  10. Good muscle tone,  joints appear normal , no effusions, left knee in immobilizer      Data Review:    CBC Recent Labs  Lab 11/13/2021 1245  WBC 11.1*  HGB 10.1*  HCT 36.0*  PLT 205  MCV 81.3  MCH 22.8*  MCHC 28.1*  RDW 15.6*  LYMPHSABS 1.8  MONOABS 0.7  EOSABS 0.1  BASOSABS 0.1   ------------------------------------------------------------------------------------------------------------------  Chemistries  Recent Labs  Lab 11/12/2021 1245  NA 150*  K 4.4  CL 111  CO2 27  GLUCOSE 496*  BUN 27*  CREATININE 1.41*  CALCIUM 9.8  AST 13*  ALT 14  ALKPHOS 96  BILITOT 0.6   ------------------------------------------------------------------------------------------------------------------ estimated creatinine clearance is 57.4 mL/min (A) (by C-G formula based on  SCr of 1.41 mg/dL (H)). ------------------------------------------------------------------------------------------------------------------ No results for input(s): TSH, T4TOTAL, T3FREE, THYROIDAB in the last 72 hours.  Invalid input(s): FREET3  Coagulation profile No results for input(s): INR,  PROTIME in the last 168 hours. ------------------------------------------------------------------------------------------------------------------- No results for input(s): DDIMER in the last 72 hours. -------------------------------------------------------------------------------------------------------------------  Cardiac Enzymes No results for input(s): CKMB, TROPONINI, MYOGLOBIN in the last 168 hours.  Invalid input(s): CK ------------------------------------------------------------------------------------------------------------------    Component Value Date/Time   BNP 130.0 (H) 03/02/2021 0656     ---------------------------------------------------------------------------------------------------------------  Urinalysis    Component Value Date/Time   COLORURINE STRAW (A) 08/14/2021 2055   APPEARANCEUR CLOUDY (A) 08/14/2021 2055   LABSPEC 1.025 08/14/2021 2055   PHURINE 5.5 08/14/2021 2055   GLUCOSEU 250 (A) 08/14/2021 2055   HGBUR LARGE (A) 08/14/2021 2055   BILIRUBINUR NEGATIVE 08/14/2021 2055   [place] NEGATIVE 08/14/2021 2055   PROTEINUR 30 (A) 08/14/2021 2055   UROBILINOGEN 1.0 12/05/2014 1519   NITRITE NEGATIVE 08/14/2021 2055   LEUKOCYTESUR MODERATE (A) 08/14/2021 2055    ----------------------------------------------------------------------------------------------------------------   Imaging Results:    DG Chest 2 View  Result Date: 10/21/2021 CLINICAL DATA:  Altered mental status, evaluate for pneumonia EXAM: CHEST - 2 VIEW COMPARISON:  03/02/2021 FINDINGS: Cardiac size is within normal limits. There are no signs of pulmonary edema or focal pulmonary consolidation. There is interval clearing of left lower lobe infiltrate. There is no significant pleural effusion or pneumothorax. IMPRESSION: No active cardiopulmonary disease. Electronically Signed   By: Elmer Picker M.D.   On: 10/27/2021 13:11   CT Head Wo Contrast  Result Date: 10/19/2021 CLINICAL  DATA:  A 76 year old male presents for evaluation of mental status changes. EXAM: CT HEAD WITHOUT CONTRAST TECHNIQUE: Contiguous axial images were obtained from the base of the skull through the vertex without intravenous contrast. RADIATION DOSE REDUCTION: This exam was performed according to the departmental dose-optimization program which includes automated exposure control, adjustment of the mA and/or kV according to patient size and/or use of iterative reconstruction technique. COMPARISON:  November of 2022. FINDINGS: Brain: No evidence of acute infarction, hemorrhage, hydrocephalus, extra-axial collection or mass lesion/mass effect. Signs of extensive atrophy and chronic microvascular ischemic change. Findings are similar to previous imaging Vascular: No hyperdense vessel or unexpected calcification. Skull: Normal. Negative for fracture or focal lesion. Sinuses/Orbits: Visualized paranasal sinuses and orbits without acute process. Other: None IMPRESSION: 1. No acute intracranial process. 2. Signs of extensive atrophy and chronic microvascular ischemic change. Findings are similar to previous imaging. Electronically Signed   By: Zetta Bills M.D.   On: 10/20/2021 16:12    My personal review of EKG: Rhythm sinus tachycardia at 124 bpm, with a QTc of 482, patient has right bundle branch block.     Assessment & Plan:    Principal Problem:   Acute encephalopathy Active Problems:   Hyperglycemia   Type 2 diabetes mellitus without complication, with long-term current use of insulin (HCC)   Paranoid schizophrenia (HCC)   CAD (coronary artery disease)   Obstructive sleep apnea   Essential hypertension   Chronic respiratory failure 02 dep with hypercarbia   Acute metabolic encephalopathy -Most likely due to hyperglycemia, hyponatremia and dehydration. -  CT head with no acute findings, -We will check UA to rule out infectious process as he has been having mild suprapubic tenderness -Hold  antipsychotic conservative medications including Cymbalta, Lexapro, gabapentin, Namenda, and Seroquel.  Hyponatremia -Due to volume depletion and dehydration which is evident by sinus tachycardia, continue with IV fluids.  Diabetes mellitus, type II, insulin-dependent with hyperglycemia -No evidence of DKA, continue with IV fluids, will add insulin sliding scale.   Essential hypertension -Continue with home medications   Paranoid schizophrenia ([place]) -Continue to hold home medications for now till mentation back to baseline.  CAD (coronary artery disease) -No EKG changes, troponins are negative.   Chronic diastolic CHF (congestive heart failure) ([place]) -Patient appears to be volume depleted, hold diuresis and monitor closely as on IV fluids.    Class II obesity, chronic hypoxic and hypercapnic respiratory failure and obstructive sleep apnea -Patient has not been using his BiPAP for long time now.   Ischemic cerebrovascular accident (CVA) ([place]) -Continue with Eliquis  DVT Prophylaxis Hon Eliquis  AM Labs Ordered, also please review Full Orders  Family Communication: Admission, patients condition and plan of care including tests being ordered have been discussed with the patient and wife at bedside who indicate understanding and agree with the plan and Code Status.  Code Status full  Likely DC to  [facility]  Condition GUARDED    Consults called: none    Admission status: inpatient  Time spent in minutes : 70 minutes   Phillips Climes M.D on 11/04/2021 at 8:37 PM   [facility] - Office  [phone]

## 2021-11-02 NOTE — ED Notes (Signed)
Called to give report to floor nurse. Floor RN requested 5 minutes to catch up.

## 2021-11-02 NOTE — ED Provider Notes (Signed)
[facility] EMERGENCY DEPARTMENT Provider Note  CSN: [id number] Arrival date & time: 11/12/2021 1202  Chief Complaint(s) Hyperglycemia  HPI Rick Cruz is a 76 y.o. male with PMH diastolic CHF, COPD, K4YJ, HTN, paroxysmal A-fib, schizophrenia previous CVA who presents to the emergency department for evaluation of tachycardia and hyperglycemia.  Minimal history provided on arrival as the patient is altered.  Per EMS, facility found the patient to be hypertensive with an undetectably high blood sugar and sent the patient to the emergency department.  Patient does arrive tachycardic in the 120s.  He is unable to provide further history due to altered mental status.  Patient arrives with tacky mucous membranes.   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   CHF (congestive heart failure) (HCC)    diastolic    Chronic pain    COPD (chronic obstructive pulmonary disease) (HCC)    Depression    Diabetes mellitus without complication (HCC)    insulin pump   GERD (gastroesophageal reflux disease)    Hypercholesterolemia    Hypertension    MDD (major depressive disorder)    On home O2    3 liters   Oxygen deficiency    Paroxysmal atrial fibrillation (HCC)    Schizophrenia ([place])    Sleep apnea    noncompliant with BiPAP   Sleep apnea    Stroke ([place])    Tobacco abuse    Patient Active Problem List   Diagnosis Date Noted   UTI (urinary tract infection) 12/14/2020   CVA (cerebral vascular accident) ([place]) 10/12/2020   Ischemic cerebrovascular accident (CVA) ([place]) 10/11/2020   Hypoalbuminemia 10/11/2020   Class 1 obesity 10/11/2020   Iron deficiency anemia 85/63/1497   Acute metabolic encephalopathy 02/63/7858   DKA, type 2, not at goal [facility]) 11/03/2019   Heart failure ([place]) 02/02/2019   Hyperglycemia    Altered mental status 11/21/2018   Diabetic foot ([place]) 07/23/2018   Accidental fall  06/25/2018   Supplemental oxygen dependent 06/25/2018   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   Neck pain 01/09/2018   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   Diabetic neuropathy ([place]) 12/13/2017   Gastroesophageal reflux disease 12/13/2017   Insomnia 07/30/2017   Depression 07/19/2016   OSA (obstructive sleep apnea)    Chronic respiratory failure with hypercapnia ([place])    Undifferentiated schizophrenia ([place])    Uncontrolled type 2 diabetes mellitus with complication    Anemia of chronic disease    Paroxysmal atrial fibrillation (HCC)    Hyponatremia 06/12/2016   Aspiration pneumonia ([place]) 08/16/2015   Seborrheic keratosis 08/04/2015   Pure hypercholesterolemia 07/05/2015   Obesity hypoventilation syndrome ([place]) 04/05/2015   CSA (central sleep apnea) 04/05/2015   Non compliance with medical treatment 04/05/2015   Type 2 diabetes mellitus with hyperglycemia ([place]) 02/28/2015   Acute respiratory failure with hypoxia and hypercarbia (HCC)    Chronic diastolic CHF (congestive heart failure) ([place]) 08/06/2014   Recurrent falls 05/08/2014   Ankle fracture, right 05/08/2014   Ankle fracture, left 05/08/2014   C. difficile colitis 01/04/2014   Delirium 01/02/2014   Acute encephalopathy 01/02/2014   Morbid obesity ([place]) 06/17/2013   Erosive esophagitis 04/22/2013   Chronic respiratory failure 02 dep with hypercarbia 04/22/2013   Type 2 diabetes mellitus without complication, with long-term current use of insulin ([place]) 04/21/2013   Paranoid schizophrenia ([place]) 04/18/2013   CAD (coronary artery disease) 04/18/2013  Dyspnea on exertion 08/26/2008   DYSLIPIDEMIA 08/25/2008   Obstructive sleep apnea 08/25/2008   Essential hypertension 08/25/2008   Dyslipidemia 08/25/2008   Home Medication(s) Prior to Admission medications   Medication Sig Start Date End Date Taking? Authorizing Provider  acetaminophen (TYLENOL) 325 MG tablet Take 2 tablets (650 mg total) by mouth every 6 (six) hours as needed  for mild pain, fever or headache (or Fever >/= 101). 02/22/20  Yes Emokpae, Courage, MD  albuterol (PROVENTIL) (2.5 MG/3ML) 0.083% nebulizer solution Take 3 mLs (2.5 mg total) by nebulization every 6 (six) hours as needed for wheezing or shortness of breath. 02/22/20  Yes Emokpae, Courage, MD  apixaban (ELIQUIS) 5 MG TABS tablet Take 1 tablet (5 mg total) by mouth 2 (two) times daily. 02/22/20  Yes Emokpae, Courage, MD  aspirin EC 81 MG tablet Take 81 mg by mouth daily. Swallow whole.   Yes [provider]  cholecalciferol (VITAMIN D) 1000 UNITS tablet Take 1,000 Units by mouth 2 (two) times daily.   Yes [provider]  Cranberry 450 MG CAPS Take 1 capsule by mouth daily.   Yes [provider]  diclofenac Sodium (VOLTAREN) 1 % GEL Apply 2 g topically 4 (four) times daily.   Yes [provider]  docusate sodium (COLACE) 100 MG capsule Take 100 mg by mouth 2 (two) times daily.   Yes [provider]  DULoxetine (CYMBALTA) 30 MG capsule Take 1 capsule (30 mg total) by mouth every morning. 02/22/20  Yes Emokpae, Courage, MD  escitalopram (LEXAPRO) 10 MG tablet Take 10 mg by mouth daily.   Yes [provider]  Fluticasone-Umeclidin-Vilant (TRELEGY ELLIPTA) 100-62.5-25 MCG/INH AEPB Inhale 1 puff into the lungs daily. 02/22/20  Yes Emokpae, Courage, MD  gabapentin (NEURONTIN) 300 MG capsule Take 1 capsule (300 mg total) by mouth at bedtime. 10/18/20  Yes Vann, Jessica U, DO  insulin glargine (LANTUS SOLOSTAR) 100 UNIT/ML Solostar Pen Inject 20 Units into the skin at bedtime. 10/18/20  Yes Vann, Jessica U, DO  melatonin 3 MG TABS tablet Take 6 mg by mouth at bedtime.   Yes [provider]  memantine (NAMENDA XR) 28 MG CP24 24 hr capsule Take 28 mg by mouth daily.   Yes [provider]  metoprolol tartrate (LOPRESSOR) 25 MG tablet Take 1 tablet (25 mg total) by mouth 2 (two) times daily. 10/18/20  Yes Geradine Girt, DO  Multiple Vitamins-Minerals  (OCUVITE EXTRA) TABS Take 1 tablet by mouth daily.   Yes [provider]  omeprazole (PRILOSEC) 20 MG capsule Take 20 mg by mouth 2 (two) times daily before a meal.   Yes [provider]  OXYGEN Inhale 2 L into the lungs continuous.   Yes [provider]  Phenylephrine-DM-GG (GUIAFEN II DM PO) Take 400 mg by mouth 3 (three) times daily.   Yes [provider]  QUEtiapine (SEROQUEL) 25 MG tablet Take 1 tablet (25 mg total) by mouth 2 (two) times daily. Patient taking differently: Take 50 mg by mouth 2 (two) times daily. 12/18/20  Yes Barton Dubois, MD  TRULICITY 3 AO/1.3YQ SOPN Inject 3 mg into the skin See admin instructions. Every Tuesday 06/07/20  Yes [provider]  azithromycin (ZITHROMAX) 250 MG tablet Take 1 tablet (250 mg total) by mouth daily. Take first 2 tablets together, then 1 every day until finished. Patient not taking: Reported on 08/14/2021 03/02/21   Mesner, Corene Cornea, MD  hydrOXYzine (ATARAX/VISTARIL) 25 MG tablet Take 1 tablet (25 mg  total) by mouth every 12 (twelve) hours as needed for anxiety or itching (Anxiety and insomnia or restlessness). Patient not taking: Reported on 11/04/2021 02/22/20   Roxan Hockey, MD  insulin aspart (NOVOLOG) 100 UNIT/ML injection Inject 0-15 Units into the skin 3 (three) times daily with meals. Patient not taking: Reported on 08/14/2021 10/18/20   Geradine Girt, DO  tiotropium (SPIRIVA) 18 MCG inhalation capsule Place 1 capsule (18 mcg total) into inhaler and inhale daily. Patient not taking: Reported on 08/14/2021 02/22/20   Roxan Hockey, MD  triamcinolone cream (KENALOG) 0.1 % Apply 1 application topically 2 (two) times daily. Patient not taking: Reported on 08/14/2021 02/11/20   Emerson Monte, FNP                                                                                                                                    Past Surgical History Past Surgical History:  Procedure Laterality Date    COLON SURGERY  March 2010   secondary to large colon polyp, final path per discharge summary notes tubulovillous adenoma.    COLONOSCOPY  July 2011   Dr. Benson [place]: multiple polyps, internal and external hemorrhoids, diverticulosis. tubular adenoma   ESOPHAGOGASTRODUODENOSCOPY (EGD) WITH PROPOFOL N/A 04/20/2013   Procedure: ESOPHAGOGASTRODUODENOSCOPY (EGD) WITH PROPOFOL;  Surgeon: Daneil Dolin, MD;  Location: [facility];  Service: Endoscopy;  Laterality: N/A;   KNEE SURGERY     X2   Family History Family History  Problem Relation Age of Onset   Thyroid disease Mother    Parkinson's disease Father    Parkinson's disease Other    Colon cancer Neg Hx     Social History Social History   Tobacco Use   Smoking status: Former    Packs/day: 1.00    Years: 50.00    Pack years: 50.00    Types: Cigarettes    Quit date: 09/16/2013    Years since quitting: 8.1   Smokeless tobacco: Never  Vaping Use   Vaping Use: Never used  Substance Use Topics   Alcohol use: No    Comment: history of ETOH abuse in remote past, none in at least 10 years   Drug use: No   Allergies Phenergan [promethazine hcl], Haldol [haloperidol lactate], and Metformin and related  Review of Systems Review of Systems  Unable to perform ROS: Mental status change   Physical Exam Vital Signs  I have reviewed the triage vital signs BP (!) 162/92    Pulse (!) 115    Temp 98.9 F (37.2 C)    Resp 18    Ht 6' (1.829 m)    Wt 104.3 kg    SpO2 100%    BMI 31.19 kg/m   Physical Exam Vitals and nursing note reviewed.  Constitutional:      General: He is not in acute distress.    Appearance: He is well-developed. He is ill-appearing.  HENT:  Head: Normocephalic and atraumatic.     Comments: Dry tacky mucous membranes Eyes:     Conjunctiva/sclera: Conjunctivae normal.  Cardiovascular:     Rate and Rhythm: Regular rhythm. Tachycardia present.     Heart sounds: No murmur heard. Pulmonary:     Effort: Pulmonary effort  is normal. No respiratory distress.     Breath sounds: Normal breath sounds.  Abdominal:     Palpations: Abdomen is soft.     Tenderness: There is no abdominal tenderness.  Musculoskeletal:        General: No swelling.     Cervical back: Neck supple.  Skin:    General: Skin is warm and dry.     Capillary Refill: Capillary refill takes 2 to 3 seconds.  Neurological:     Mental Status: He is alert. He is disoriented.  Psychiatric:        Mood and Affect: Mood normal.    ED Results and Treatments Labs (all labs ordered are listed, but only abnormal results are displayed) Labs Reviewed  COMPREHENSIVE METABOLIC PANEL - Abnormal; Notable for the following components:      Result Value   Sodium 150 (*)    Glucose, Bld 496 (*)    BUN 27 (*)    Creatinine, Ser 1.41 (*)    Total Protein 8.3 (*)    AST 13 (*)    GFR, Estimated 52 (*)    All other components within normal limits  CBC WITH DIFFERENTIAL/PLATELET - Abnormal; Notable for the following components:   WBC 11.1 (*)    Hemoglobin 10.1 (*)    HCT 36.0 (*)    MCH 22.8 (*)    MCHC 28.1 (*)    RDW 15.6 (*)    Neutro Abs 8.4 (*)    All other components within normal limits  BLOOD GAS, VENOUS - Abnormal; Notable for the following components:   pH, Ven 7.49 (*)    pO2, Ven 114 (*)    Bicarbonate 33.5 (*)    Acid-Base Excess 9.2 (*)    All other components within normal limits  BETA-HYDROXYBUTYRIC ACID - Abnormal; Notable for the following components:   Beta-Hydroxybutyric Acid 0.63 (*)    All other components within normal limits  CBG MONITORING, ED - Abnormal; Notable for the following components:   Glucose-Capillary 455 (*)    All other components within normal limits  RESP PANEL BY RT-PCR (FLU A&B, COVID) ARPGX2  URINALYSIS, ROUTINE W REFLEX MICROSCOPIC  TROPONIN I (HIGH SENSITIVITY)  TROPONIN I (HIGH SENSITIVITY)                                                                                                                           Radiology DG Chest 2 View  Result Date: 11/09/2021 CLINICAL DATA:  Altered mental status, evaluate for pneumonia EXAM: CHEST - 2 VIEW COMPARISON:  03/02/2021 FINDINGS: Cardiac size is within normal limits. There are no signs of pulmonary edema or focal pulmonary consolidation.  There is interval clearing of left lower lobe infiltrate. There is no significant pleural effusion or pneumothorax. IMPRESSION: No active cardiopulmonary disease. Electronically Signed   By: Elmer Picker M.D.   On: 10/26/2021 13:11   CT Head Wo Contrast  Result Date: 11/14/2021 CLINICAL DATA:  A 76 year old male presents for evaluation of mental status changes. EXAM: CT HEAD WITHOUT CONTRAST TECHNIQUE: Contiguous axial images were obtained from the base of the skull through the vertex without intravenous contrast. RADIATION DOSE REDUCTION: This exam was performed according to the departmental dose-optimization program which includes automated exposure control, adjustment of the mA and/or kV according to patient size and/or use of iterative reconstruction technique. COMPARISON:  November of 2022. FINDINGS: Brain: No evidence of acute infarction, hemorrhage, hydrocephalus, extra-axial collection or mass lesion/mass effect. Signs of extensive atrophy and chronic microvascular ischemic change. Findings are similar to previous imaging Vascular: No hyperdense vessel or unexpected calcification. Skull: Normal. Negative for fracture or focal lesion. Sinuses/Orbits: Visualized paranasal sinuses and orbits without acute process. Other: None IMPRESSION: 1. No acute intracranial process. 2. Signs of extensive atrophy and chronic microvascular ischemic change. Findings are similar to previous imaging. Electronically Signed   By: Zetta Bills M.D.   On: 10/21/2021 16:12    Pertinent labs & imaging results that were available during my care of the patient were reviewed by me and considered in my medical decision making (see  MDM for details).  Medications Ordered in ED Medications  LORazepam (ATIVAN) tablet 1 mg (has no administration in time range)  lactated ringers bolus 1,000 mL (0 mLs Intravenous Stopped 10/29/2021 1459)                                                                                                                                     Procedures .Critical Care Performed by: Teressa Lower, MD Authorized by: Teressa Lower, MD   Critical care provider statement:    Critical care time (minutes):  30   Critical care was necessary to treat or prevent imminent or life-threatening deterioration of the following conditions:  Dehydration   Critical care was time spent personally by me on the following activities:  Development of treatment plan with patient or surrogate, discussions with consultants, evaluation of patient's response to treatment, examination of patient, ordering and review of laboratory studies, ordering and review of radiographic studies, ordering and performing treatments and interventions, pulse oximetry, re-evaluation of patient's condition and review of old charts  (including critical care time)  Medical Decision Making / ED Course   This patient presents to the ED for concern of tachycardia, hyperglycemia, this involves an extensive number of treatment options, and is a complaint that carries with it a high risk of complications and morbidity.  The differential diagnosis includes DKA, HHS, stress hyperglycemia, dehydration, UTI  MDM: Patient seen emergency department for evaluation of tachycardia and hyperglycemia.  Physical exam reveals a disoriented ill-appearing patient with  a regular tachycardia and dry tacky mucous membranes but is otherwise unremarkable.  Laboratory evaluation with initial pH of 7.49, beta hydroxybutyrate slightly elevated at 0.63, COVID flu negative, leukocytosis to 11.1, hemoglobin 10.1, hyponatremia to 150, glucose elevated to 496, creatinine elevated  to 1.41, BUN 27.  IV access difficult on this patient and fluid resuscitation and attempted to begin.  Urinalysis is currently pending.  Patient may be developing early DKA but is currently not acidotic.  Patient then signed out to oncoming provider.  Please see provider signout note for continuation of work-up.  Anticipate admission for altered mental status and dehydration.   Additional history obtained:  -External records from outside source obtained and reviewed including: Chart review including previous notes, labs, imaging, consultation notes   Lab Tests: -I ordered, reviewed, and interpreted labs.   The pertinent results include:   Labs Reviewed  COMPREHENSIVE METABOLIC PANEL - Abnormal; Notable for the following components:      Result Value   Sodium 150 (*)    Glucose, Bld 496 (*)    BUN 27 (*)    Creatinine, Ser 1.41 (*)    Total Protein 8.3 (*)    AST 13 (*)    GFR, Estimated 52 (*)    All other components within normal limits  CBC WITH DIFFERENTIAL/PLATELET - Abnormal; Notable for the following components:   WBC 11.1 (*)    Hemoglobin 10.1 (*)    HCT 36.0 (*)    MCH 22.8 (*)    MCHC 28.1 (*)    RDW 15.6 (*)    Neutro Abs 8.4 (*)    All other components within normal limits  BLOOD GAS, VENOUS - Abnormal; Notable for the following components:   pH, Ven 7.49 (*)    pO2, Ven 114 (*)    Bicarbonate 33.5 (*)    Acid-Base Excess 9.2 (*)    All other components within normal limits  BETA-HYDROXYBUTYRIC ACID - Abnormal; Notable for the following components:   Beta-Hydroxybutyric Acid 0.63 (*)    All other components within normal limits  CBG MONITORING, ED - Abnormal; Notable for the following components:   Glucose-Capillary 455 (*)    All other components within normal limits  RESP PANEL BY RT-PCR (FLU A&B, COVID) ARPGX2  URINALYSIS, ROUTINE W REFLEX MICROSCOPIC  TROPONIN I (HIGH SENSITIVITY)  TROPONIN I (HIGH SENSITIVITY)      EKG   EKG  Interpretation  Date/Time:  Thursday November 02 2021 12:09:36 EST Ventricular Rate:  124 PR Interval:  137 QRS Duration: 124 QT Interval:  335 QTC Calculation: 482 R Axis:   -29 Text Interpretation: Sinus tachycardia Right bundle branch block Left ventricular hypertrophy Borderline prolonged QT interval Confirmed by [place] ([id number]) on 10/22/2021 4:31:53 PM         Imaging Studies ordered: I ordered imaging studies including CXR, CTH I independently visualized and interpreted imaging. I agree with the radiologist interpretation   Medicines ordered and prescription drug management: Meds ordered this encounter  Medications   lactated ringers bolus 1,000 mL   LORazepam (ATIVAN) tablet 1 mg    -I have reviewed the patients home medicines and have made adjustments as needed  Critical interventions Fluid resuscitation, insulin   Cardiac Monitoring: The patient was maintained on a cardiac monitor.  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   CHF (congestive heart failure) (HCC)    diastolic    Chronic pain    COPD (chronic obstructive pulmonary disease) (HCC)    Depression    Diabetes mellitus without complication (HCC)    insulin pump   GERD (gastroesophageal reflux disease)    Hypercholesterolemia    Hypertension    MDD (major depressive disorder)    On home O2    3 liters   Oxygen deficiency    Paroxysmal atrial fibrillation (HCC)    Schizophrenia ([place])    Sleep apnea    noncompliant with BiPAP   Sleep apnea    Stroke ([place])     Tobacco abuse       Dispostion: I considered admission for this patient, and disposition will be pending urinalysis and response to fluid.  Anticipate admission.     Final Clinical Impression(s) / ED Diagnoses Final diagnoses:  None     @PCDICTATION @    Teressa Lower, MD 11/08/2021 [phone]

## 2021-11-02 NOTE — ED Notes (Signed)
Additional attempt at IV access unsuccessful x2, Dr Matilde Sprang aware, order head CT

## 2021-11-02 NOTE — ED Notes (Signed)
Lab attempted to draw labs, unsuccessful. To attempt once ativan in effect

## 2021-11-03 DIAGNOSIS — N390 Urinary tract infection, site not specified: Secondary | ICD-10-CM | POA: Diagnosis not present

## 2021-11-03 DIAGNOSIS — N179 Acute kidney failure, unspecified: Secondary | ICD-10-CM | POA: Diagnosis present

## 2021-11-03 DIAGNOSIS — I10 Essential (primary) hypertension: Secondary | ICD-10-CM

## 2021-11-03 DIAGNOSIS — G9340 Encephalopathy, unspecified: Secondary | ICD-10-CM | POA: Diagnosis not present

## 2021-11-03 DIAGNOSIS — I639 Cerebral infarction, unspecified: Secondary | ICD-10-CM | POA: Diagnosis not present

## 2021-11-03 DIAGNOSIS — L8990 Pressure ulcer of unspecified site, unspecified stage: Secondary | ICD-10-CM | POA: Diagnosis present

## 2021-11-03 DIAGNOSIS — I480 Paroxysmal atrial fibrillation: Secondary | ICD-10-CM | POA: Diagnosis not present

## 2021-11-03 DIAGNOSIS — E119 Type 2 diabetes mellitus without complications: Secondary | ICD-10-CM

## 2021-11-03 DIAGNOSIS — Z794 Long term (current) use of insulin: Secondary | ICD-10-CM

## 2021-11-03 LAB — CBC
HCT: 32 % — ABNORMAL LOW (ref 39.0–52.0)
Hemoglobin: 8.5 g/dL — ABNORMAL LOW (ref 13.0–17.0)
MCH: 22 pg — ABNORMAL LOW (ref 26.0–34.0)
MCHC: 26.6 g/dL — ABNORMAL LOW (ref 30.0–36.0)
MCV: 82.7 fL (ref 80.0–100.0)
Platelets: 179 10*3/uL (ref 150–400)
RBC: 3.87 MIL/uL — ABNORMAL LOW (ref 4.22–5.81)
RDW: 15.7 % — ABNORMAL HIGH (ref 11.5–15.5)
WBC: 10 10*3/uL (ref 4.0–10.5)
nRBC: 0 % (ref 0.0–0.2)

## 2021-11-03 LAB — BASIC METABOLIC PANEL
Anion gap: 10 (ref 5–15)
BUN: 27 mg/dL — ABNORMAL HIGH (ref 8–23)
CO2: 29 mmol/L (ref 22–32)
Calcium: 9.1 mg/dL (ref 8.9–10.3)
Chloride: 109 mmol/L (ref 98–111)
Creatinine, Ser: 1.3 mg/dL — ABNORMAL HIGH (ref 0.61–1.24)
GFR, Estimated: 58 mL/min — ABNORMAL LOW (ref >60)
Glucose, Bld: 312 mg/dL — ABNORMAL HIGH (ref 70–99)
Potassium: 4.2 mmol/L (ref 3.5–5.1)
Sodium: 148 mmol/L — ABNORMAL HIGH (ref 135–145)

## 2021-11-03 LAB — MRSA NEXT GEN BY PCR, NASAL: MRSA by PCR Next Gen: DETECTED — AB

## 2021-11-03 LAB — GLUCOSE, CAPILLARY
Glucose-Capillary: 305 mg/dL — ABNORMAL HIGH (ref 70–99)
Glucose-Capillary: 343 mg/dL — ABNORMAL HIGH (ref 70–99)
Glucose-Capillary: 219 mg/dL — ABNORMAL HIGH (ref 70–99)
Glucose-Capillary: 254 mg/dL — ABNORMAL HIGH (ref 70–99)

## 2021-11-03 MED ORDER — QUETIAPINE FUMARATE 25 MG PO TABS
50.0000 mg | ORAL_TABLET | Freq: Two times a day (BID) | ORAL | Status: DC
Start: 2021-11-03 — End: 2021-11-04
  Administered 2021-11-03 – 2021-11-04 (×2): 50 mg via ORAL
  Filled 2021-11-03 (×2): qty 2

## 2021-11-03 MED ORDER — ALBUTEROL SULFATE (2.5 MG/3ML) 0.083% IN NEBU: 2.5000 mg | INHALATION_SOLUTION | Freq: Four times a day (QID) | RESPIRATORY_TRACT | Status: DC | PRN

## 2021-11-03 MED ORDER — ESCITALOPRAM OXALATE 10 MG PO TABS
10.0000 mg | ORAL_TABLET | Freq: Every day | ORAL | Status: DC
  Administered 2021-11-03 – 2021-11-05 (×3): 10 mg via ORAL
  Filled 2021-11-03 (×3): qty 1

## 2021-11-03 MED ORDER — FLUTICASONE FUROATE-VILANTEROL 100-25 MCG/ACT IN AEPB
1.0000 | INHALATION_SPRAY | Freq: Every day | RESPIRATORY_TRACT | Status: DC
  Administered 2021-11-04: 1 via RESPIRATORY_TRACT
  Filled 2021-11-03: qty 28

## 2021-11-03 MED ORDER — UMECLIDINIUM BROMIDE 62.5 MCG/ACT IN AEPB
1.0000 | INHALATION_SPRAY | Freq: Every day | RESPIRATORY_TRACT | Status: DC
  Administered 2021-11-04: 1 via RESPIRATORY_TRACT
  Filled 2021-11-03: qty 7

## 2021-11-03 MED ORDER — OCUVITE-LUTEIN PO CAPS
1.0000 | ORAL_CAPSULE | Freq: Every day | ORAL | Status: DC
  Administered 2021-11-03 – 2021-11-05 (×3): 1 via ORAL
  Filled 2021-11-03 (×3): qty 1

## 2021-11-03 MED ORDER — GABAPENTIN 300 MG PO CAPS
300.0000 mg | ORAL_CAPSULE | Freq: Every day | ORAL | Status: DC
  Administered 2021-11-03 – 2021-11-04 (×2): 300 mg via ORAL
  Filled 2021-11-03 (×3): qty 1

## 2021-11-03 MED ORDER — DOCUSATE SODIUM 100 MG PO CAPS
100.0000 mg | ORAL_CAPSULE | Freq: Two times a day (BID) | ORAL | Status: DC
  Administered 2021-11-03 – 2021-11-05 (×4): 100 mg via ORAL
  Filled 2021-11-03 (×4): qty 1

## 2021-11-03 MED ORDER — MEMANTINE HCL ER 7 MG PO CP24
28.0000 mg | ORAL_CAPSULE | Freq: Every day | ORAL | Status: DC
  Administered 2021-11-03 – 2021-11-05 (×3): 28 mg via ORAL
  Filled 2021-11-03 (×4): qty 4

## 2021-11-03 MED ORDER — SODIUM CHLORIDE 0.9 % IV SOLN
1.0000 g | INTRAVENOUS | Status: DC
  Administered 2021-11-03: 1 g via INTRAVENOUS
  Filled 2021-11-03: qty 10

## 2021-11-03 MED ORDER — DULOXETINE HCL 30 MG PO CPEP
30.0000 mg | ORAL_CAPSULE | Freq: Every morning | ORAL | Status: DC
  Administered 2021-11-04 – 2021-11-05 (×2): 30 mg via ORAL
  Filled 2021-11-03 (×2): qty 1

## 2021-11-03 NOTE — Plan of Care (Signed)
°  Problem: Acute Rehab OT Goals (only OT should resolve) Goal: Pt. Will Perform Grooming Flowsheets (Taken 11/03/2021 1047) Pt Will Perform Grooming:  sitting  with min guard assist Goal: Pt. Will Perform Upper Body Dressing Flowsheets (Taken 11/03/2021 1047) Pt Will Perform Upper Body Dressing:  with min guard assist  sitting Goal: Pt. Will Perform Lower Body Dressing Flowsheets (Taken 11/03/2021 1047) Pt Will Perform Lower Body Dressing:  with mod assist  sitting/lateral leans Goal: Pt. Will Transfer To Toilet Flowsheets (Taken 11/03/2021 1047) Pt Will Transfer to Toilet:  with mod assist  stand pivot transfer  bedside commode  with max assist Goal: Pt/Caregiver Will Perform Home Exercise Program Flowsheets (Taken 11/03/2021 1047) Pt/caregiver will Perform Home Exercise Program:  Increased ROM  Increased strength  Right Upper extremity  Left upper extremity  With minimal assist  Zhaire Locker OT, MOT

## 2021-11-03 NOTE — Progress Notes (Signed)
Inpatient Diabetes Program Recommendations  AACE/ADA: New Consensus Statement on Inpatient Glycemic Control   Target Ranges:  Prepandial:   less than 140 mg/dL      Peak postprandial:   less than 180 mg/dL (1-2 hours)      Critically ill patients:  140 - 180 mg/dL    Latest Reference Range & Units 10/22/2021 12:12 10/25/2021 18:51 11/07/2021 23:17 11/03/21 08:03  Glucose-Capillary 70 - 99 mg/dL 455 (H) 346 (H) 351 (H) 305 (H)    Latest Reference Range & Units 12/13/20 18:03 10/30/2021 12:45  Hemoglobin A1C 4.8 - 5.6 % 8.6 (H) 9.4 (H)   Review of Glycemic Control  Diabetes history: DM2 Outpatient Diabetes medications: Lantus 20 units QHS, Trulicity 3 mg Qweek Current orders for Inpatient glycemic control: Levemir 20 units QHS, Novolog 15 units TID with meals, Novolog 0-15 units TID with meals, Novolog 0-5 units QHS  Inpatient Diabetes Program Recommendations:    Insulin: If glucose continues to be consistently over 180 mg/dl, please consider increasing Levemir to 30 units QHS (based on 104.3 kg x 0.3 units).  HbgA1C: A1C 9.4% on 11/04/2021 indicating an average glucose 223 mg/dl of over the past 2-3 months. Would recommend adjusting outpatient DM medication at time of discharge to improve DM control.  NOTE: Patient from [facility] with hyperglycemia and altered mental status. Per outpatient medication list, patient is being given Lantus 20 units QHS and Trulicity 3 mg Qweek. Given A1C is 9.4% on 11/08/2021, anticipate patient will need medication adjustments to get DM under better control.   Thanks, Barnie Alderman, RN, MSN, CDE Diabetes Coordinator Inpatient Diabetes Program [phone] (Team Pager from 8am to 5pm)

## 2021-11-03 NOTE — Evaluation (Deleted)
Physical Therapy Evaluation Patient Details Name: Rick Cruz MRN: [id number] DOB: October 10, 1945 Today's Date: 11/03/2021  History of Present Illness  Rick Cruz  is a 76 y.o. male, with medical history significant of osteoarthritis, history of other nonhemorrhagic stroke, right eye blindness, chronic diastolic heart failure, chronic respiratory failure due to COPD on home oxygen, sleep apnea noncompliant with BiPAP, history of tobacco abuse, type II DM, hyperlipidemia, paroxysmal A-fib, hypertension, history of unspecified schizophrenia who presents from [place] [facility] due to AMS.  -Patient was sent from [facility] SN[facility] due to altered mental status, as well facility noted him to be hypertensive, and elevated CBG, patient was confused, most of the history was provided by ED staff and wife at bedside, per wife she thinks her husband taking too much psych medications which make him more sleepy, but she does not altered mental status has been progressive over the last week, where he was not interactive as usual, poor appetite, she did not note him having any focal deficits.   Clinical Impression  Patient lying in bed on PT arrival.  OT present.  Patient needs min to mod A for rolling in bed and min to mod A to maintain sitting balance on EOB. He sits with posterior and occassinal L side lean.  STS from EOB to RW with mod A x 2 .  Patient with noted significant extension lag L knee so he is unable to bear much weight through his L LE.  He is unable to weight shift in order to take a step.  PT and OT assist patient back to bed.  Patient will benefit from continued skilled PT interventions here at the hospital and at his next venue of care to address general weakness, fall risk, bed mobility and transfer training.      Recommendations for follow up therapy are one component of a multi-disciplinary discharge planning process, led by the attending physician.  Recommendations may be updated based on patient status,  additional functional criteria and insurance authorization.  Follow Up Recommendations Skilled nursing-short term rehab (<3 hours/day)    Assistance Recommended at Discharge Frequent or constant Supervision/Assistance  Patient can return home with the following  Direct supervision/assist for medications management;Two people to help with walking and/or transfers;Two people to help with bathing/dressing/bathroom;Help with stairs or ramp for entrance;Assistance with cooking/housework    Equipment Recommendations    Recommendations for Other Services       Functional Status Assessment Patient has had a recent decline in their functional status and demonstrates the ability to make significant improvements in function in a reasonable and predictable amount of time.     Precautions / Restrictions Precautions Precautions: Fall Restrictions Weight Bearing Restrictions: No      Mobility  Bed Mobility Overal bed mobility: Needs Assistance Bed Mobility: Supine to Sit, Sit to Supine     Supine to sit: Min assist, HOB elevated, Mod assist Sit to supine: Mod assist   General bed mobility comments: Slow labored movement.    Transfers Overall transfer level: Needs assistance Equipment used: Rolling walker (2 wheels) Transfers: Sit to/from Stand Sit to Stand: Mod assist, +2 physical assistance           General transfer comment: Pt was able to complete sit to stand once with pt showing inability to extended R LE with minimal weight bearing. Therapists providing assist on either side of pt for safety.    Ambulation/Gait  Stairs            Wheelchair Mobility    Modified Rankin (Stroke Patients Only)       Balance Overall balance assessment: Needs assistance Sitting-balance support: Feet supported, No upper extremity supported Sitting balance-Leahy Scale: Poor Sitting balance - Comments: poor to fair seated at EOB Postural control: Posterior  lean, Left lateral lean Standing balance support: Bilateral upper extremity supported, During functional activity, Reliant on assistive device for balance Standing balance-Leahy Scale: Poor Standing balance comment: using RW                             Pertinent Vitals/Pain Pain Assessment Pain Assessment: 0-10 Pain Score: 0-No pain Pain Location: no pain on evaluation however he reports he sometimes has pain in his neck and back    Home Living Family/patient expects to be discharged to:: Skilled nursing facility                   Additional Comments: Pt reports using a RW at SNF.    Prior Function Prior Level of Function : Needs assist       Physical Assist : Mobility (physical);ADLs (physical) Mobility (physical): Bed mobility;Transfers;Gait;Stairs ADLs (physical): Grooming;Bathing;Dressing;Toileting;IADLs Mobility Comments: Pt reportedly stands at bedside but is mostly in bed at [facility]. ADLs Comments: Pt reports independent eating but assist needed for all other ADL's. IADL's also assisted.     Hand Dominance   Dominant Hand: Right    Extremity/Trunk Assessment   Upper Extremity Assessment Upper Extremity Assessment: Defer to OT evaluation RUE Deficits / Details: generally weak LUE Deficits / Details: Reported weakness from previous stroke. 2+/5 shoulder flexion MMT. ~75% of available range for shoulder flexion in P/ROM. Pt able to use L UE functionally during bed mobility and ransfer.    Lower Extremity Assessment Lower Extremity Assessment: Generalized weakness;LLE deficits/detail LLE Deficits / Details: L LE weakness from old CVA and has L knee extension lag    Cervical / Trunk Assessment Cervical / Trunk Assessment: Kyphotic  Communication   Communication: No difficulties  Cognition Arousal/Alertness: Awake/alert Behavior During Therapy: WFL for tasks assessed/performed Overall Cognitive Status: Within Functional Limits for tasks assessed                                  General Comments: Pt oreiented to month and president. Pt reported being at [facility] with the year being 2022.        General Comments      Exercises     Assessment/Plan    PT Assessment Patient needs continued PT services  PT Problem List Decreased strength;Decreased mobility;Decreased safety awareness;Decreased range of motion;Decreased coordination;Decreased activity tolerance;Decreased balance;Pain       PT Treatment Interventions DME instruction;Therapeutic exercise;Gait training;Balance training;Neuromuscular re-education;Functional mobility training;Therapeutic activities;Patient/family education    PT Goals (Current goals can be found in the Care Plan section)  Acute Rehab PT Goals Patient Stated Goal: return home PT Goal Formulation: With patient Time For Goal Achievement: 11/17/21 Potential to Achieve Goals: Good    Frequency Min 2X/week     Co-evaluation PT/OT/SLP Co-Evaluation/Treatment: Yes Reason for Co-Treatment: Complexity of the patient's impairments (multi-system involvement);Necessary to address cognition/behavior during functional activity;For patient/therapist safety   OT goals addressed during session: ADL's and self-care       AM-PAC PT "6 Clicks" Mobility  Outcome Measure Help needed turning from  your back to your side while in a flat bed without using bedrails?: A Little Help needed moving from lying on your back to sitting on the side of a flat bed without using bedrails?: A Lot Help needed moving to and from a bed to a chair (including a wheelchair)?: A Lot Help needed standing up from a chair using your arms (e.g., wheelchair or bedside chair)?: A Lot Help needed to walk in hospital room?: Total Help needed climbing 3-5 steps with a railing? : Total 6 Click Score: 11    End of Session Equipment Utilized During Treatment: Gait belt Activity Tolerance: Patient tolerated treatment  well Patient left: in bed;with call bell/phone within reach Nurse Communication: Mobility status PT Visit Diagnosis: Unsteadiness on feet (R26.81);Other abnormalities of gait and mobility (R26.89);Muscle weakness (generalized) (M62.81);Repeated falls (R29.6)    Time: 3128-1188 PT Time Calculation (min) (ACUTE ONLY): 15 min   Charges:     PT Treatments $Therapeutic Activity: 8-22 mins        {11:49 AM, 11/03/21 Rick Cruz physical therapy [place] [phone]

## 2021-11-03 NOTE — Care Management Important Message (Signed)
Important Message  Patient Details  Name: Rick Cruz MRN: [id number] Date of Birth: May 01, 1946   Medicare Important Message Given:  Yes     Tommy Medal 11/03/2021, 1:14 PM

## 2021-11-03 NOTE — Assessment & Plan Note (Addendum)
Hypernatremia  Will check BMP today, his electrolytes have been improving.

## 2021-11-03 NOTE — Assessment & Plan Note (Addendum)
Stable cell count.

## 2021-11-03 NOTE — Assessment & Plan Note (Addendum)
Hold metoprolol for now, continue blood pressure monitoring.

## 2021-11-03 NOTE — Assessment & Plan Note (Addendum)
Holding duloxetine, escitalopram, gabapentin and quetiapine.  Patient critically ill.

## 2021-11-03 NOTE — Evaluation (Signed)
Occupational Therapy Evaluation Patient Details Name: Rick Cruz MRN: [id number] DOB: [birth year] Today's Date: 11/03/2021   History of Present Illness Rick Cruz  is a 76 y.o. male, with medical history significant of osteoarthritis, history of other nonhemorrhagic stroke, right eye blindness, chronic diastolic heart failure, chronic respiratory failure due to COPD on home oxygen, sleep apnea noncompliant with BiPAP, history of tobacco abuse, type II DM, hyperlipidemia, paroxysmal A-fib, hypertension, history of unspecified schizophrenia who presents from [place] [facility] due to AMS.  -Patient was sent from [facility] SNF due to altered mental status, as well facility noted him to be hypertensive, and elevated CBG, patient was confused, most of the history was provided by ED staff and wife at bedside, per wife she thinks her husband taking too much psych medications which make him more sleepy, but she does not altered mental status has been progressive over the last week, where he was not interactive as usual, poor appetite, she did not note him having any focal deficits.   Clinical Impression   Pt agreeable to OT and PT co-evaluation. Pt donning 2 L supplemental O2 throughout session. Pt reports needing much assist at baseline for ADL's. Pt's wife talked to nursing stating that he does not ambulate due to L knee issues. Pt demosntrates limited L UE A/ROM and P/ROM with reports that these deficits are from a prior stroke. Pt reported limited vision in L eye from previous stroke as well. Poor to fair sitting balance at EOB with need for min to mod A for bed mobility. Pt able to stand once but without extending L LE with minimal weight bearing with use of RW and mod A +2. Pt left in bed with bed alarm set and call bell within reach. Pt will benefit from continued OT in the hospital and recommended venue below to increase strength, balance, and endurance for safe ADL's.        Recommendations for follow up  therapy are one component of a multi-disciplinary discharge planning process, led by the attending physician.  Recommendations may be updated based on patient status, additional functional criteria and insurance authorization.   Follow Up Recommendations  Skilled nursing-short term rehab (<3 hours/day)    Assistance Recommended at Discharge Frequent or constant Supervision/Assistance  Patient can return home with the following Two people to help with walking and/or transfers;A lot of help with bathing/dressing/bathroom;Assistance with cooking/housework;Direct supervision/assist for medications management;Direct supervision/assist for financial management;Help with stairs or ramp for entrance;Assist for transportation    Functional Status Assessment  Patient has had a recent decline in their functional status and demonstrates the ability to make significant improvements in function in a reasonable and predictable amount of time.  Equipment Recommendations  None recommended by OT           Precautions / Restrictions Precautions Precautions: Fall Restrictions Weight Bearing Restrictions: No      Mobility Bed Mobility Overal bed mobility: Needs Assistance Bed Mobility: Supine to Sit, Sit to Supine     Supine to sit: Min assist, HOB elevated, Mod assist Sit to supine: Mod assist   General bed mobility comments: Slow labored movement.    Transfers Overall transfer level: Needs assistance Equipment used: Rolling walker (2 wheels) Transfers: Sit to/from Stand Sit to Stand: Mod assist, +2 physical assistance           General transfer comment: Pt was able to complete sit to stand once with pt showing inability to extended R LE with minimal weight bearing. Therapists  providing assist on either side of pt for safety.      Balance Overall balance assessment: Needs assistance Sitting-balance support: Feet supported, No upper extremity supported Sitting balance-Leahy Scale:  Poor Sitting balance - Comments: poor to fair seated at EOB   Standing balance support: Bilateral upper extremity supported, During functional activity, Reliant on assistive device for balance Standing balance-Leahy Scale: Poor Standing balance comment: using RW                           ADL either performed or assessed with clinical judgement   ADL Overall ADL's : Needs assistance/impaired     Grooming: Sitting;Moderate assistance;Minimal assistance       Lower Body Bathing: Total assistance;Bed level   Upper Body Dressing : Sitting;Minimal assistance   Lower Body Dressing: Total assistance;Maximal assistance;Bed level Lower Body Dressing Details (indicate cue type and reason): Assisted to don socks in bed. Toilet Transfer: Maximal assistance;+2 for physical assistance;Stand-pivot Toilet Transfer Details (indicate cue type and reason): Partially simulated via sit to stand from EOB using RW. Toileting- Clothing Manipulation and Hygiene: Total assistance;Bed level         General ADL Comments: Pt assisted a good deal at baseline per reports.     Vision Baseline Vision/History:  (Pt reports L eye vision deficits. Chart states R eye blind but pt reports vision deficits in L eye at baseline from previous stroke.) Ability to See in Adequate Light: 2 Moderately impaired Patient Visual Report: No change from baseline Vision Assessment?: Yes Tracking/Visual Pursuits: Able to track stimulus in all quads without difficulty Additional Comments: Pt presetns with L eye vision deficits at baseline. This date pt was able to accurately state numbers held up ~5 feet in front of him and demonstrate good visual tracking.                Pertinent Vitals/Pain Pain Assessment Pain Assessment: No/denies pain     Hand Dominance Right   Extremity/Trunk Assessment Upper Extremity Assessment Upper Extremity Assessment: RUE deficits/detail;LUE deficits/detail RUE Deficits /  Details: generally weak LUE Deficits / Details: Reported weakness from previous stroke. 2+/5 shoulder flexion MMT. ~75% of available range for shoulder flexion in P/ROM. Pt able to use L UE functionally during bed mobility and ransfer.   Lower Extremity Assessment Lower Extremity Assessment: Defer to PT evaluation   Cervical / Trunk Assessment Cervical / Trunk Assessment: Kyphotic   Communication Communication Communication: No difficulties   Cognition Arousal/Alertness: Awake/alert Behavior During Therapy: WFL for tasks assessed/performed Overall Cognitive Status: Within Functional Limits for tasks assessed                                 General Comments: Pt oreiented to month and president. Pt reported being at [facility] with the year being 2022.                      Home Living Family/patient expects to be discharged to:: Skilled nursing facility                                 Additional Comments: Pt reports using a RW at SNF. Pt reports needing 3L O2 at baseline.       Prior Functioning/Environment Prior Level of Function : Needs assist       Physical  Assist : Mobility (physical);ADLs (physical) Mobility (physical): Bed mobility;Transfers;Gait;Stairs ADLs (physical): Grooming;Bathing;Dressing;Toileting;IADLs Mobility Comments: Pt reportedly stands at bedside but is mostly in bed at [facility]. ADLs Comments: Pt reports independent eating but assist needed for all other ADL's. IADL's also assisted.        OT Problem List: Decreased strength;Decreased range of motion;Decreased activity tolerance;Impaired balance (sitting and/or standing)      OT Treatment/Interventions: Self-care/ADL training;Therapeutic exercise;Therapeutic activities;Patient/family education;Visual/perceptual remediation/compensation;Balance training    OT Goals(Current goals can be found in the care plan section) Acute Rehab OT Goals Patient Stated Goal: Go to a  rehab facility in [place], [place]. OT Goal Formulation: With patient Time For Goal Achievement: 11/17/21 Potential to Achieve Goals: Fair  OT Frequency: Min 2X/week    Co-evaluation PT/OT/SLP Co-Evaluation/Treatment: Yes Reason for Co-Treatment: To address functional/ADL transfers   OT goals addressed during session: ADL's and self-care                       End of Session Equipment Utilized During Treatment: Rolling walker (2 wheels);Oxygen Nurse Communication: Mobility status  Activity Tolerance: Patient tolerated treatment well Patient left: in bed;with call bell/phone within reach;with bed alarm set  OT Visit Diagnosis: Unsteadiness on feet (R26.81);Other abnormalities of gait and mobility (R26.89);Muscle weakness (generalized) (M62.81)                Time: 0093-8182 OT Time Calculation (min): 26 min Charges:  OT General Charges $OT Visit: 1 Visit OT Evaluation $OT Eval Moderate Complexity: 1 Mod  Taneya Conkel OT, MOT  Larey Seat 11/03/2021, 10:43 AM

## 2021-11-03 NOTE — Assessment & Plan Note (Addendum)
Cpap as outpatient

## 2021-11-03 NOTE — Assessment & Plan Note (Addendum)
Acute on chronic respiratory failure Continue with invasive mechanical ventilation.

## 2021-11-03 NOTE — TOC Initial Note (Signed)
Transition of Care [facility]) - Initial/Assessment Note    Patient Details  Name: Rick Cruz MRN: [id number] Date of Birth: 1946-02-01  Transition of Care [facility]) CM/SW Contact:    Iona Beard, [place] Phone Number: 11/03/2021, 12:21 PM  Clinical Narrative:                 CSW spoke to [place] with [facility] as pt arrived from this facility. Jackelyn Poling confirms that pt is a long term resident with their facility. However, Jackelyn Poling states that if PT recommends SNF, as they have we will need to start insurance auth for pt. If Josem Kaufmann is denied facility states they will still be able to accept pt back. TOC to follow.   Expected Discharge Plan: [place] Barriers to Discharge: Continued Medical Work up   Patient Goals and CMS Choice Patient states their goals for this hospitalization and ongoing recovery are:: Return to [place] [employer] Compare Post Acute Care list provided to:: Patient    Expected Discharge Plan and Services Expected Discharge Plan: [place] In-house Referral: Clinical Social Work Discharge Planning Services: CM Consult Post Acute Care Choice: [place] Living arrangements for the past 2 months: [place]                                      Prior Living Arrangements/Services Living arrangements for the past 2 months: [place] Lives with:: Facility Resident Patient language and need for interpreter reviewed:: Yes Do you feel safe going back to the place where you live?: Yes      Need for Family Participation in Patient Care: Yes (Comment) Care giver support system in place?: Yes (comment)   Criminal Activity/Legal Involvement Pertinent to Current Situation/Hospitalization: No - Comment as needed  Activities of Daily Living Home Assistive Devices/Equipment: Wheelchair (uses wheelchair at snf facility) ADL Screening (condition at time of admission) Patient's  cognitive ability adequate to safely complete daily activities?: Yes Is the patient deaf or have difficulty hearing?: No Does the patient have difficulty seeing, even when wearing glasses/contacts?: Yes (right eye blindness) Does the patient have difficulty concentrating, remembering, or making decisions?: Yes Patient able to express need for assistance with ADLs?: Yes Does the patient have difficulty dressing or bathing?: Yes Independently performs ADLs?: No Does the patient have difficulty walking or climbing stairs?: Yes Weakness of Legs: Left Weakness of Arms/Hands: Left  Permission Sought/Granted                  Emotional Assessment         Alcohol / Substance Use: Not Applicable Psych Involvement: No (comment)  Admission diagnosis:  Acute cholecystitis [K81.0] Dehydration [E86.0] Hypernatremia [E87.0] Hyperglycemia [R73.9] Acute encephalopathy [G93.40] AKI (acute kidney injury) ([place]) [N17.9] Patient Active Problem List   Diagnosis Date Noted   UTI (urinary tract infection) 12/14/2020   CVA (cerebral vascular accident) ([place]) 10/12/2020   Ischemic cerebrovascular accident (CVA) ([place]) 10/11/2020   Hypoalbuminemia 10/11/2020   Class 1 obesity 10/11/2020   Iron deficiency anemia 85/88/5027   Acute metabolic encephalopathy 74/08/8785   DKA, type 2, not at goal [facility]) 11/03/2019   Heart failure ([place]) 02/02/2019   Hyperglycemia    Altered mental status 11/21/2018   Diabetic foot ([place]) 07/23/2018   Accidental fall 06/25/2018   Supplemental oxygen dependent 06/25/2018   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   Neck pain 01/09/2018  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   Diabetic neuropathy ([place]) 12/13/2017   Gastroesophageal reflux disease 12/13/2017   Insomnia 07/30/2017   Depression 07/19/2016   OSA (obstructive sleep apnea)    Chronic respiratory failure with hypercapnia ([place])    Undifferentiated schizophrenia ([place])    Uncontrolled type 2 diabetes mellitus with  complication    Anemia of chronic disease    Paroxysmal atrial fibrillation (HCC)    Hyponatremia 06/12/2016   Aspiration pneumonia ([place]) 08/16/2015   Seborrheic keratosis 08/04/2015   Pure hypercholesterolemia 07/05/2015   Obesity hypoventilation syndrome ([place]) 04/05/2015   CSA (central sleep apnea) 04/05/2015   Non compliance with medical treatment 04/05/2015   Type 2 diabetes mellitus with hyperglycemia ([place]) 02/28/2015   Acute respiratory failure with hypoxia and hypercarbia (HCC)    Chronic diastolic CHF (congestive heart failure) ([place]) 08/06/2014   Recurrent falls 05/08/2014   Ankle fracture, right 05/08/2014   Ankle fracture, left 05/08/2014   C. 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 [place] [address] Phone: [phone] Fax: [phone]     Social Determinants of Health (SDOH) Interventions    Readmission Risk Interventions Readmission Risk Prevention Plan 12/19/2020 12/15/2020  Transportation Screening Complete Complete  PCP or Specialist Appt within 3-5 Days Complete Complete  HRI or Home Care Consult Complete Complete  Social Work  Consult for [place] Planning/Counseling Complete Complete  Palliative Care Screening Not Applicable Not Applicable  Medication Review Press photographer) Complete Complete  Some recent data might be hidden

## 2021-11-03 NOTE — Hospital Course (Addendum)
Rick Cruz was admitted to the hospital with the working diagnosis of acute metabolic encephalopathy due to urine infection. Complicated with aspiration pneumonia and cardiac arrest.   76 yo male with the past medical history of osteoarthritis, CVA, right eye blindness, COPD, diastolic heart failure, O8CZ, atrial fibrillation, HTN and chronic hypoxemic respiratory failure on home 02 who presented with altered mental status. Patient was noted to be confused, his blood pressure and glucose were elevated and he was transferred from SNF to the ED for further evaluation. On his initial physical examination his HR was 120, bp 126/96, temp 98 and oxygen saturation 100%. He was lethargic but able to answer some questions, lungs clear to auscultation, heart with S1 and S2 present and rhythmic, abdomen soft with mild suprapubic tenderness, and no lower extremity edema.   Na 150, K 4,4. CL 111. Bicarb 27, glucose 496, bun 27 and cr 1,41 Wbc 11,1 hgb 10,1, hct 36 plt 205  Sars covid 19 negative   Urine analysis with sg 1,017 with >50 wbc  Head CT with no acute changes.   Chest radiograph with no infiltrates  EKG 124 bpm, left axis deviation, right bundle branch block, sinus rhythm, with no significant ST segment or T wave changes.   Patient was placed on IV fluids and antibiotic therapy for urinary tract infection.  Insulin therapy for hyperglycemia.  His mentation improved back to his baseline.   02/19 patient had a episode of severe aspiration pneumonia leading to severe hypoxemia and cardiac arrest.  CODE blue activated and after 10 minutes he recovered his pulse, placed on invasive mechanical ventilation and transferred to the ICU.

## 2021-11-03 NOTE — Evaluation (Signed)
Physical Therapy Evaluation Patient Details Name: Rick Cruz MRN: [id number] DOB: [birth year] Today's Date: 11/03/2021  History of Present Illness  Rick Cruz  is a 76 y.o. male, with medical history significant of osteoarthritis, history of other nonhemorrhagic stroke, right eye blindness, chronic diastolic heart failure, chronic respiratory failure due to COPD on home oxygen, sleep apnea noncompliant with BiPAP, history of tobacco abuse, type II DM, hyperlipidemia, paroxysmal A-fib, hypertension, history of unspecified schizophrenia who presents from [place] [facility] due to AMS.  -Patient was sent from [facility] SNF due to altered mental status, as well facility noted him to be hypertensive, and elevated CBG, patient was confused, most of the history was provided by ED staff and wife at bedside, per wife she thinks her husband taking too much psych medications which make him more sleepy, but she does not altered mental status has been progressive over the last week, where he was not interactive as usual, poor appetite, she did not note him having any focal deficits.    Clinical Impression    Patient lying in bed on PT arrival.  OT present.  Patient needs min to mod A for rolling in bed and min to mod A to maintain sitting balance on EOB. He sits with posterior and occassinal L side lean.  STS from EOB to RW with mod A x 2 .  Patient with noted significant extension lag L knee so he is unable to bear much weight through his L LE.  He is unable to weight shift in order to take a step.  PT and OT assist patient back to bed.  Patient will benefit from continued skilled PT interventions here at the hospital and at his next venue of care to address general weakness, fall risk, bed mobility and transfer training.        Recommendations for follow up therapy are one component of a multi-disciplinary discharge planning process, led by the attending physician.  Recommendations may be updated based on patient  status, additional functional criteria and insurance authorization.  Follow Up Recommendations Skilled nursing-short term rehab (<3 hours/day)    Assistance Recommended at Discharge Frequent or constant Supervision/Assistance  Patient can return home with the following  Direct supervision/assist for medications management;Two people to help with walking and/or transfers;Two people to help with bathing/dressing/bathroom;Help with stairs or ramp for entrance;Assistance with cooking/housework    Equipment Recommendations    Recommendations for Other Services       Functional Status Assessment Patient has had a recent decline in their functional status and demonstrates the ability to make significant improvements in function in a reasonable and predictable amount of time.     Precautions / Restrictions Precautions Precautions: Fall Restrictions Weight Bearing Restrictions: No      Mobility  Bed Mobility Overal bed mobility: Needs Assistance Bed Mobility: Supine to Sit, Sit to Supine     Supine to sit: Min assist, HOB elevated, Mod assist Sit to supine: Mod assist   General bed mobility comments: Slow labored movement.    Transfers Overall transfer level: Needs assistance Equipment used: Rolling walker (2 wheels) Transfers: Sit to/from Stand Sit to Stand: Mod assist, +2 physical assistance           General transfer comment: Pt was able to complete sit to stand once with pt showing inability to extended R LE with minimal weight bearing. Therapists providing assist on either side of pt for safety.    Ambulation/Gait  Stairs            Wheelchair Mobility    Modified Rankin (Stroke Patients Only)       Balance Overall balance assessment: Needs assistance Sitting-balance support: Feet supported, No upper extremity supported Sitting balance-Leahy Scale: Poor Sitting balance - Comments: poor to fair seated at EOB Postural control:  Posterior lean, Left lateral lean Standing balance support: Bilateral upper extremity supported, During functional activity, Reliant on assistive device for balance Standing balance-Leahy Scale: Poor Standing balance comment: using RW                             Pertinent Vitals/Pain Pain Assessment Pain Assessment: 0-10 Pain Score: 0-No pain Pain Location: no pain on evaluation however he reports he sometimes has pain in his neck and back    Home Living Family/patient expects to be discharged to:: Skilled nursing facility                   Additional Comments: Pt reports using a RW at SNF.    Prior Function Prior Level of Function : Needs assist       Physical Assist : Mobility (physical);ADLs (physical) Mobility (physical): Bed mobility;Transfers;Gait;Stairs ADLs (physical): Grooming;Bathing;Dressing;Toileting;IADLs Mobility Comments: Pt reportedly stands at bedside but is mostly in bed at [facility]. ADLs Comments: Pt reports independent eating but assist needed for all other ADL's. IADL's also assisted.     Hand Dominance   Dominant Hand: Right    Extremity/Trunk Assessment   Upper Extremity Assessment Upper Extremity Assessment: Defer to OT evaluation RUE Deficits / Details: generally weak LUE Deficits / Details: Reported weakness from previous stroke. 2+/5 shoulder flexion MMT. ~75% of available range for shoulder flexion in P/ROM. Pt able to use L UE functionally during bed mobility and ransfer.    Lower Extremity Assessment Lower Extremity Assessment: Generalized weakness;LLE deficits/detail LLE Deficits / Details: L LE weakness from old CVA and has L knee extension lag    Cervical / Trunk Assessment Cervical / Trunk Assessment: Kyphotic  Communication   Communication: No difficulties  Cognition Arousal/Alertness: Awake/alert Behavior During Therapy: WFL for tasks assessed/performed Overall Cognitive Status: Within Functional Limits for tasks  assessed                                 General Comments: Pt oreiented to month and president. Pt reported being at [facility] with the year being 2022.        General Comments      Exercises     Assessment/Plan    PT Assessment Patient needs continued PT services  PT Problem List Decreased strength;Decreased mobility;Decreased safety awareness;Decreased range of motion;Decreased coordination;Decreased activity tolerance;Decreased balance;Pain       PT Treatment Interventions DME instruction;Therapeutic exercise;Gait training;Balance training;Neuromuscular re-education;Functional mobility training;Therapeutic activities;Patient/family education    PT Goals (Current goals can be found in the Care Plan section)  Acute Rehab PT Goals Patient Stated Goal: return home PT Goal Formulation: With patient Time For Goal Achievement: 11/17/21 Potential to Achieve Goals: Good    Frequency Min 2X/week     Co-evaluation PT/OT/SLP Co-Evaluation/Treatment: Yes Reason for Co-Treatment: Complexity of the patient's impairments (multi-system involvement);Necessary to address cognition/behavior during functional activity;For patient/therapist safety           AM-PAC PT "6 Clicks" Mobility  Outcome Measure Help needed turning from your back to your side while  in a flat bed without using bedrails?: A Little Help needed moving from lying on your back to sitting on the side of a flat bed without using bedrails?: A Lot Help needed moving to and from a bed to a chair (including a wheelchair)?: A Lot Help needed standing up from a chair using your arms (e.g., wheelchair or bedside chair)?: A Lot Help needed to walk in hospital room?: Total Help needed climbing 3-5 steps with a railing? : Total 6 Click Score: 11    End of Session Equipment Utilized During Treatment: Gait belt Activity Tolerance: Patient tolerated treatment well Patient left: in bed;with call bell/phone within  reach Nurse Communication: Mobility status PT Visit Diagnosis: Unsteadiness on feet (R26.81);Other abnormalities of gait and mobility (R26.89);Muscle weakness (generalized) (M62.81);Repeated falls (R29.6)    Time: 1610-9604 PT Time Calculation (min) (ACUTE ONLY): 15 min   Charges:   PT Evaluation $PT Eval Moderate Complexity: 1 Mod PT Treatments $Therapeutic Activity: 8-22 mins        3:24 PM, 11/03/21 Cyntha Brickman [facility] [place] [facility] [place] [phone]

## 2021-11-03 NOTE — Assessment & Plan Note (Signed)
Continue with local care.

## 2021-11-03 NOTE — Assessment & Plan Note (Addendum)
Urine analysis positive for pyuria  Patient completed 3 dosed of ceftriaxone. Now critically ill will place a foley cathter.

## 2021-11-03 NOTE — Plan of Care (Signed)
°  Problem: Acute Rehab PT Goals(only PT should resolve) Goal: Pt Will Go Supine/Side To Sit Outcome: Progressing Flowsheets (Taken 11/03/2021 1150) Pt will go Supine/Side to Sit: with minimal assist Goal: Patient Will Perform Sitting Balance Outcome: Progressing Flowsheets (Taken 11/03/2021 1150) Patient will perform sitting balance: with minimal assist Goal: Patient Will Transfer Sit To/From Stand Outcome: Progressing Flowsheets (Taken 11/03/2021 1150) Patient will transfer sit to/from stand: with moderate assist  11:51 AM, 11/03/21 Rick Cruz [facility] [place] [facility] [place] [phone] [phone]

## 2021-11-03 NOTE — Assessment & Plan Note (Addendum)
Continue with metoprolol and apixaban per NG tube.

## 2021-11-03 NOTE — Progress Notes (Addendum)
Progress Note   Patient: Rick Cruz HCW:[id number] DOB: 23-Nov-1945 DOA: 10/20/2021     1 DOS: the patient was seen and examined on 11/03/2021   Brief hospital course: Mr. Naumann was admitted to the hospital with the working diagnosis of acute metabolic encephalopathy.  76 yo male with the past medical history of osteoarthritis, CVA, right eye blindness, COPD, diastolic heart failure, V7OH, atrial fibrillation, HTN and chronic hypoxemic respiratory failure on home 02 who presented with altered mental status. Patient was noted to be confused, his blood pressure and glucose were elevated and he was transferred to the ED for further evaluation. On his initial physical examination his HR was 120, bp 126/96, temp 98 and oxygen saturation 100%. He was lethargic but able to answer some questions, lungs clear to auscultation, heart with S1 and S2 present and rhythmic, abdomen soft with mild suprapubic tenderness, and no lower extremity edema.   Na 150, K 4,4. CL 111. Bicarb 27, glucose 496, bun 27 and cr 1,41 Wbc 11,1 hgb 10,1, hct 36 plt 205  Sars covid 19 negative   Urine analysis with sg 1,017 with >50 wbc  Head CT with no acute changes.   Chest radiograph with no infiltrates  EKG 124 bpm, left axis deviation, right bundle branch block, sinus rhythm, with no significant ST segment or T wave changes.   Patient was placed on IV fluids and antibiotic therapy for urinary tract infection.  Insulin therapy for hyperglycemia.   Assessment and Plan: * Acute encephalopathy- (present on admission) Patient is more awake and alert. Encephalopathy likely multifactorial, including urinary tract infection. Continue neuro checks per unit protocol. Out of bed to chair tid with meals, PT, OT and nutrition consult.   UTI (urinary tract infection)- (present on admission) Urine analysis positive for pyuria  Plan to start patient on antibiotic therapy with ceftriaxone Follow up on cell count, cultures and  temperature curve.   AKI (acute kidney injury) ([place]) Hypernatremia  Renal function with serum cr at 1,30 with AK at 4,2 and serum bicarbonate at 29 Na 148  Plan to discontinue IV fluids and continue close follow up on renal function and electrolytes.   CVA (cerebral vascular accident) ([place])- (present on admission) Continue blood pressure control.   Paroxysmal atrial fibrillation ([place])- (present on admission) Continue rate control with metoprolol and anticoagulation with apixaban.   Essential hypertension- (present on admission) Continue blood pressure control with metoprolol. Improved po intake, will discontinue IV fluids.   Type 2 diabetes mellitus without complication, with long-term current use of insulin ([place]) Patient is more awake and reactive  Uncontrolled hyperglycemia, with fasting glucose up to 312.  Plan to continue insulin therapy basal and sliding scale for glucose cover and monitoring.   Chronic respiratory failure 02 dep with hypercarbia- (present on admission) Continue oxymetry monitoring and supplemental 02 per [place].   Obstructive sleep apnea- (present on admission) Continue with CPap at discharge.   Paranoid schizophrenia ([place])- (present on admission) Continue neuro checks per unit protocol.   Plan to resume his medical regimen with duloxetine, escitalopram and gabapentin.  Melatonin and quetiapine.   CAD (coronary artery disease)- (present on admission) No chest pain, continue with metoprolol.   Class 1 obesity- (present on admission) Calculated BMI 31,19    Iron deficiency anemia- (present on admission) hgb has been stable, follow up iron panel as outpatient   Pressure injury of skin- (present on admission) Continue with local care.         Subjective:  Patient is more awake and alert, no dyspnea or chest pain, no nausea or vomiting.   Physical Exam: Vitals:   10/22/2021 2222 11/03/21 0312 11/03/21 0613 11/03/21 1217  BP: 140/89 120/67 123/77  133/76  Pulse: 99 67 77   Resp: 17 16 16 16   Temp: 98.7 F (37.1 C) 97.7 F (36.5 C) 97.6 F (36.4 C) 98.6 F (37 C)  TempSrc: Oral Axillary Oral Oral  SpO2: 100% 100% 97% 98%  Weight:      Height:       Neurology awake and alert ENT with no pallor Cardiovascular with S1 and S2 present and rhythmic with no gallops or murmurs No JVD  No lower extremity edema Respiratory with no wheezing, rales or rhonchi Abdomen soft and non tender   Data Reviewed:    Family Communication: no family at the bedside   Disposition: Status is: Inpatient Remains inpatient appropriate because: IV antibiotic therapy      Planned Discharge Destination: Skilled nursing facility     Author: Tawni Millers, MD 11/03/2021 3:36 PM  For on call review [email or web address].

## 2021-11-03 NOTE — Assessment & Plan Note (Signed)
Calculated BMI 31,19

## 2021-11-03 NOTE — Progress Notes (Signed)
[employer] started 11/03/21, name is listed as Rick Cruz

## 2021-11-03 NOTE — Assessment & Plan Note (Addendum)
No signs of acute coronary syndrome.

## 2021-11-03 NOTE — Assessment & Plan Note (Addendum)
Post cardiac arrest with worsening encephalopathy, after 10 minutes of CPR there is risk of anoxic brain injury.  Continue sedation with propofol for 24 hrs and reassess neurologic function tomorrow morning.   Will address code status with his wife and mother.

## 2021-11-03 NOTE — Assessment & Plan Note (Addendum)
Persistent hyperglycemia. Continue insulin sliding scale for glucose cover and monitoring.  Patient NPO for now due to critical condition.

## 2021-11-03 NOTE — NC FL2 (Signed)
[place] LEVEL OF CARE SCREENING TOOL     IDENTIFICATION  Patient Name: Rick Cruz Birthdate: November 20, 1945 Sex: male Admission Date (Current Location): 10/27/2021  [facility] and [place] Number:  [employer] and Address:  [place] [address], [place]      Provider Number: [id number]  Attending Physician Name and Address:  Tawni Millers  Relative Name and Phone Number:       Current Level of Care: Hospital Recommended Level of Care: [place] Prior Approval Number:    Date Approved/Denied:   PASRR Number:    Discharge Plan: SNF    Current Diagnoses: Patient Active Problem List   Diagnosis Date Noted   UTI (urinary tract infection) 12/14/2020   CVA (cerebral vascular accident) ([place]) 10/12/2020   Ischemic cerebrovascular accident (CVA) ([place]) 10/11/2020   Hypoalbuminemia 10/11/2020   Class 1 obesity 10/11/2020   Iron deficiency anemia 74/08/8785   Acute metabolic encephalopathy 76/72/0947   DKA, type 2, not at goal [facility]) 11/03/2019   Heart failure ([place]) 02/02/2019   Hyperglycemia    Altered mental status 11/21/2018   Diabetic foot ([place]) 07/23/2018   Accidental fall 06/25/2018   Supplemental oxygen dependent 06/25/2018   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   Neck pain 01/09/2018   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   Diabetic neuropathy ([place]) 12/13/2017   Gastroesophageal reflux disease 12/13/2017   Insomnia 07/30/2017   Depression 07/19/2016   OSA (obstructive sleep apnea)    Chronic respiratory failure with hypercapnia ([place])    Undifferentiated schizophrenia ([place])    Uncontrolled type 2 diabetes mellitus with complication    Anemia of chronic disease    Paroxysmal atrial fibrillation (HCC)    Hyponatremia 06/12/2016   Aspiration pneumonia ([place]) 08/16/2015   Seborrheic keratosis 08/04/2015   Pure hypercholesterolemia 07/05/2015   Obesity hypoventilation syndrome ([place])  04/05/2015   CSA (central sleep apnea) 04/05/2015   Non compliance with medical treatment 04/05/2015   Type 2 diabetes mellitus with hyperglycemia ([place]) 02/28/2015   Acute respiratory failure with hypoxia and hypercarbia (HCC)    Chronic diastolic CHF (congestive heart failure) ([place]) 08/06/2014   Recurrent falls 05/08/2014   Ankle fracture, right 05/08/2014   Ankle fracture, left 05/08/2014   C. difficile colitis 01/04/2014   Delirium 01/02/2014   Acute encephalopathy 01/02/2014   Morbid obesity ([place]) 06/17/2013   Erosive esophagitis 04/22/2013   Chronic respiratory failure 02 dep with hypercarbia 04/22/2013   Type 2 diabetes mellitus without complication, with long-term current use of insulin ([place]) 04/21/2013   Paranoid schizophrenia ([place]) 04/18/2013   CAD (coronary artery disease) 04/18/2013   Dyspnea on exertion 08/26/2008   DYSLIPIDEMIA 08/25/2008   Obstructive sleep apnea 08/25/2008   Essential hypertension 08/25/2008   Dyslipidemia 08/25/2008    Orientation RESPIRATION BLADDER Height & Weight     Self, Time  O2 (2L) Incontinent, External catheter Weight: 230 lb (104.3 kg) Height:  6' (182.9 cm)  BEHAVIORAL SYMPTOMS/MOOD NEUROLOGICAL BOWEL NUTRITION STATUS      Continent Diet (see d/c summary)  AMBULATORY STATUS COMMUNICATION OF NEEDS Skin   Extensive Assist Verbally Other (Comment) (Buttocks posterior proximal right lower stage 2)                       Personal Care Assistance Level of Assistance  Bathing, Feeding, Dressing Bathing Assistance: Maximum assistance Feeding assistance: Independent Dressing Assistance: Maximum assistance     Functional Limitations Info  Sight,  Hearing, Speech Sight Info: Adequate Hearing Info: Adequate Speech Info: Adequate    SPECIAL CARE FACTORS FREQUENCY  PT (By licensed PT), OT (By licensed OT)     PT Frequency: 5 times weekly OT Frequency: 5 times weekly            Contractures Contractures Info: Not present     Additional Factors Info  Code Status, Allergies Code Status Info: FULL Allergies Info: Phenergan(promethazine hcl), haldol (haloperidol lactate), metformin and related           Current Medications (11/03/2021):  This is the current hospital active medication list Current Facility-Administered Medications  Medication Dose Route Frequency Provider Last Rate Last Admin   acetaminophen (TYLENOL) tablet 650 mg  650 mg Oral Q6H PRN Elgergawy, Silver Huguenin, MD       apixaban (ELIQUIS) tablet 5 mg  5 mg Oral BID Elgergawy, Silver Huguenin, MD   5 mg at 11/03/21 0955   aspirin EC tablet 81 mg  81 mg Oral Daily Elgergawy, Silver Huguenin, MD   81 mg at 11/03/21 0954   hydrocortisone cream 1 %   Topical BID Zierle-Ghosh, Asia B, DO   Given at 11/03/21 1219   insulin aspart (novoLOG) injection 0-15 Units  0-15 Units Subcutaneous TID WC Elgergawy, Silver Huguenin, MD   11 Units at 11/03/21 1218   insulin aspart (novoLOG) injection 0-5 Units  0-5 Units Subcutaneous QHS Elgergawy, Silver Huguenin, MD   5 Units at 10/27/2021 2323   insulin aspart (novoLOG) injection 15 Units  15 Units Subcutaneous Once Elgergawy, Silver Huguenin, MD       insulin detemir (LEVEMIR) injection 20 Units  20 Units Subcutaneous QHS Elgergawy, Silver Huguenin, MD   20 Units at 10/24/2021 2249   lactated ringers infusion   Intravenous Continuous Elgergawy, Silver Huguenin, MD 100 mL/hr at 11/03/21 0917 Infusion Verify at 11/03/21 0917   melatonin tablet 6 mg  6 mg Oral QHS Elgergawy, Silver Huguenin, MD   6 mg at 11/09/2021 2248   metoprolol tartrate (LOPRESSOR) tablet 25 mg  25 mg Oral BID Elgergawy, Silver Huguenin, MD   25 mg at 11/03/21 0954   pantoprazole (PROTONIX) EC tablet 40 mg  40 mg Oral Daily Elgergawy, Silver Huguenin, MD   40 mg at 11/03/21 9030     Discharge Medications: Please see discharge summary for a list of discharge medications.  Relevant Imaging Results:  Relevant Lab Results:   Additional Information SSN: [id number]  Iona Beard, [place]

## 2021-11-03 NOTE — Assessment & Plan Note (Addendum)
Continue neuro checks per unit protocol, possible new anoxic brain injury

## 2021-11-04 DIAGNOSIS — N390 Urinary tract infection, site not specified: Secondary | ICD-10-CM | POA: Diagnosis not present

## 2021-11-04 DIAGNOSIS — I639 Cerebral infarction, unspecified: Secondary | ICD-10-CM | POA: Diagnosis not present

## 2021-11-04 DIAGNOSIS — N179 Acute kidney failure, unspecified: Secondary | ICD-10-CM | POA: Diagnosis not present

## 2021-11-04 DIAGNOSIS — G9340 Encephalopathy, unspecified: Secondary | ICD-10-CM | POA: Diagnosis not present

## 2021-11-04 LAB — GLUCOSE, CAPILLARY
Glucose-Capillary: 211 mg/dL — ABNORMAL HIGH (ref 70–99)
Glucose-Capillary: 290 mg/dL — ABNORMAL HIGH (ref 70–99)
Glucose-Capillary: 324 mg/dL — ABNORMAL HIGH (ref 70–99)
Glucose-Capillary: 295 mg/dL — ABNORMAL HIGH (ref 70–99)

## 2021-11-04 LAB — BASIC METABOLIC PANEL
Anion gap: 9 (ref 5–15)
BUN: 18 mg/dL (ref 8–23)
CO2: 30 mmol/L (ref 22–32)
Calcium: 8.9 mg/dL (ref 8.9–10.3)
Chloride: 103 mmol/L (ref 98–111)
Creatinine, Ser: 1.06 mg/dL (ref 0.61–1.24)
GFR, Estimated: >60 mL/min (ref >60)
Glucose, Bld: 217 mg/dL — ABNORMAL HIGH (ref 70–99)
Potassium: 4.2 mmol/L (ref 3.5–5.1)
Sodium: 142 mmol/L (ref 135–145)

## 2021-11-04 LAB — CBC
HCT: 36 % — ABNORMAL LOW (ref 39.0–52.0)
Hemoglobin: 9.6 g/dL — ABNORMAL LOW (ref 13.0–17.0)
MCH: 21.7 pg — ABNORMAL LOW (ref 26.0–34.0)
MCHC: 26.7 g/dL — ABNORMAL LOW (ref 30.0–36.0)
MCV: 81.4 fL (ref 80.0–100.0)
Platelets: 141 10*3/uL — ABNORMAL LOW (ref 150–400)
RBC: 4.42 MIL/uL (ref 4.22–5.81)
RDW: 15.5 % (ref 11.5–15.5)
WBC: 8.5 10*3/uL (ref 4.0–10.5)
nRBC: 0 % (ref 0.0–0.2)

## 2021-11-04 MED ORDER — SODIUM CHLORIDE 0.9 % IV SOLN
1.0000 g | INTRAVENOUS | Status: DC
  Administered 2021-11-04: 1 g via INTRAVENOUS
  Filled 2021-11-04: qty 10

## 2021-11-04 MED ORDER — QUETIAPINE FUMARATE 25 MG PO TABS
25.0000 mg | ORAL_TABLET | Freq: Two times a day (BID) | ORAL | Status: DC
  Administered 2021-11-04 – 2021-11-05 (×3): 25 mg via ORAL
  Filled 2021-11-04 (×3): qty 1

## 2021-11-04 NOTE — Progress Notes (Signed)
Progress Note   Patient: Rick Cruz NLZ:[id number] DOB: 04/24/1946 DOA: 10/27/2021     2 DOS: the patient was seen and examined on 11/04/2021   Brief hospital course: Rick Cruz was admitted to the hospital with the working diagnosis of acute metabolic encephalopathy.  76 yo male with the past medical history of osteoarthritis, CVA, right eye blindness, COPD, diastolic heart failure, T0WI, atrial fibrillation, HTN and chronic hypoxemic respiratory failure on home 02 who presented with altered mental status. Patient was noted to be confused, his blood pressure and glucose were elevated and he was transferred to the ED for further evaluation. On his initial physical examination his HR was 120, bp 126/96, temp 98 and oxygen saturation 100%. He was lethargic but able to answer some questions, lungs clear to auscultation, heart with S1 and S2 present and rhythmic, abdomen soft with mild suprapubic tenderness, and no lower extremity edema.   Na 150, K 4,4. CL 111. Bicarb 27, glucose 496, bun 27 and cr 1,41 Wbc 11,1 hgb 10,1, hct 36 plt 205  Sars covid 19 negative   Urine analysis with sg 1,017 with >50 wbc  Head CT with no acute changes.   Chest radiograph with no infiltrates  EKG 124 bpm, left axis deviation, right bundle branch block, sinus rhythm, with no significant ST segment or T wave changes.   Patient was placed on IV fluids and antibiotic therapy for urinary tract infection.  Insulin therapy for hyperglycemia.   Assessment and Plan: * Acute encephalopathy- (present on admission) Encephalopathy likely multifactorial, including urinary tract infection. Clinically improved, likely back to his baseline.   Pending insurance authorization in order to return to SNF.   UTI (urinary tract infection)- (present on admission) Urine analysis positive for pyuria  Wbc is 8,5,  Urine culture from 11/22 with enterococcus. Patient is responding well to ceftriaxone, will plan to continue for 3  days. Clinically improved.  Check renal US to rule out obstructive uropathy.   AKI (acute kidney injury) ([place])- (present on admission) Hypernatremia  Renal function improved with serum cr at 1,0 with K at 4,2 and serum bicarbonate at 30. Na 142 Continue to encourage po intake.  Discontinue IV fluids.   CVA (cerebral vascular accident) ([place])- (present on admission) Continue blood pressure control.  Patient is medically stable to continue PT and OT at SNF  Paroxysmal atrial fibrillation ([place])- (present on admission) On metoprolol for rate control and anticoagulation with apixaban.   Essential hypertension- (present on admission) Continue blood pressure control with metoprolol.   Type 2 diabetes mellitus without complication, with long-term current use of insulin (HCC) Fasting glucose is 217, continue with uncontrolled hyperglycemia.   Continue insulin sliding scale for glucose cover and monitoring.  Patient is tolerating po well.    Chronic respiratory failure 02 dep with hypercarbia- (present on admission) Oxymetry is 99% on 3 L/min per [place].   Obstructive sleep apnea- (present on admission) Continue with CPap at discharge.   Paranoid schizophrenia ([place])- (present on admission) Continue with duloxetine, escitalopram and gabapentin.  Melatonin and quetiapine.   CAD (coronary artery disease)- (present on admission) No chest pain, continue with metoprolol.   Class 1 obesity- (present on admission) Calculated BMI 31,19    Iron deficiency anemia- (present on admission) hgb has been stable, follow up iron panel as outpatient   Pressure injury of skin- (present on admission) Continue with local care.         Subjective: Patient is feeling well, no agitation, he  is out of bed to chair no nausea or vomiting,   Physical Exam: Vitals:   11/03/21 2128 11/04/21 0523 11/04/21 0856 11/04/21 1337  BP: (!) 156/77 122/60  (!) 99/55  Pulse: 64 (!) 50  60  Resp: 17 18  17    Temp: 98.5 F (36.9 C) 97.7 F (36.5 C)  (!) 97.5 F (36.4 C)  TempSrc: Oral   Oral  SpO2: 100% 100% 90% 99%  Weight:      Height:       Neurology awake and alert, no agitation likely at his baseline ENT with mild pallor Cardiovascular with S1 and S2 present and rhythmic with no gallops or murmurs No JVD No lower extremity edema Respiratory with no wheezing or rales Abdomen not distended  No lower extremity edema   Data Reviewed:    Family Communication: I spoke over the phone with the patient's wife about patient's  condition, plan of care, prognosis and all questions were addressed.   Disposition: Status is: Inpatient Remains inpatient appropriate because: pending transfer to SNF     Planned Discharge Destination: Skilled nursing facility     Author: Tawni Millers, MD 11/04/2021 2:27 PM  For on call review [email or web address].

## 2021-11-04 NOTE — Plan of Care (Signed)

## 2021-11-05 ENCOUNTER — Inpatient Hospital Stay (HOSPITAL_COMMUNITY): Payer: Medicare Other

## 2021-11-05 DIAGNOSIS — N179 Acute kidney failure, unspecified: Secondary | ICD-10-CM | POA: Diagnosis not present

## 2021-11-05 DIAGNOSIS — D509 Iron deficiency anemia, unspecified: Secondary | ICD-10-CM

## 2021-11-05 DIAGNOSIS — K810 Acute cholecystitis: Secondary | ICD-10-CM | POA: Diagnosis not present

## 2021-11-05 DIAGNOSIS — J690 Pneumonitis due to inhalation of food and vomit: Secondary | ICD-10-CM

## 2021-11-05 DIAGNOSIS — G9340 Encephalopathy, unspecified: Secondary | ICD-10-CM | POA: Diagnosis not present

## 2021-11-05 LAB — BLOOD GAS, ARTERIAL
Acid-base deficit: 2.7 mmol/L — ABNORMAL HIGH (ref 0.0–2.0)
Bicarbonate: 27.7 mmol/L (ref 20.0–28.0)
Drawn by: 27016
FIO2: 100 %
O2 Saturation: 99 %
Patient temperature: 37
pCO2 arterial: 76 mmHg (ref 32–48)
pH, Arterial: 7.17 — CL (ref 7.35–7.45)
pO2, Arterial: 139 mmHg — ABNORMAL HIGH (ref 83–108)

## 2021-11-05 LAB — BASIC METABOLIC PANEL
Anion gap: 13 (ref 5–15)
BUN: 18 mg/dL (ref 8–23)
CO2: 24 mmol/L (ref 22–32)
Calcium: 8.5 mg/dL — ABNORMAL LOW (ref 8.9–10.3)
Chloride: 96 mmol/L — ABNORMAL LOW (ref 98–111)
Creatinine, Ser: 1.47 mg/dL — ABNORMAL HIGH (ref 0.61–1.24)
GFR, Estimated: 50 mL/min — ABNORMAL LOW (ref >60)
Glucose, Bld: 374 mg/dL — ABNORMAL HIGH (ref 70–99)
Potassium: 3.6 mmol/L (ref 3.5–5.1)
Sodium: 133 mmol/L — ABNORMAL LOW (ref 135–145)

## 2021-11-05 LAB — CBC WITH DIFFERENTIAL/PLATELET
Abs Immature Granulocytes: 0.62 10*3/uL — ABNORMAL HIGH (ref 0.00–0.07)
Basophils Absolute: 0.1 10*3/uL (ref 0.0–0.1)
Basophils Relative: 1 %
Eosinophils Absolute: 0.6 10*3/uL — ABNORMAL HIGH (ref 0.0–0.5)
Eosinophils Relative: 3 %
HCT: 36.8 % — ABNORMAL LOW (ref 39.0–52.0)
Hemoglobin: 10 g/dL — ABNORMAL LOW (ref 13.0–17.0)
Immature Granulocytes: 3 %
Lymphocytes Relative: 28 %
Lymphs Abs: 5.9 10*3/uL — ABNORMAL HIGH (ref 0.7–4.0)
MCH: 22.6 pg — ABNORMAL LOW (ref 26.0–34.0)
MCHC: 27.2 g/dL — ABNORMAL LOW (ref 30.0–36.0)
MCV: 83.1 fL (ref 80.0–100.0)
Monocytes Absolute: 0.9 10*3/uL (ref 0.1–1.0)
Monocytes Relative: 4 %
Neutro Abs: 13.3 10*3/uL — ABNORMAL HIGH (ref 1.7–7.7)
Neutrophils Relative %: 61 %
Platelets: 274 10*3/uL (ref 150–400)
RBC: 4.43 MIL/uL (ref 4.22–5.81)
RDW: 15.2 % (ref 11.5–15.5)
WBC: 21.4 10*3/uL — ABNORMAL HIGH (ref 4.0–10.5)
nRBC: 0.1 % (ref 0.0–0.2)

## 2021-11-05 LAB — GLUCOSE, CAPILLARY
Glucose-Capillary: 210 mg/dL — ABNORMAL HIGH (ref 70–99)
Glucose-Capillary: 213 mg/dL — ABNORMAL HIGH (ref 70–99)
Glucose-Capillary: 363 mg/dL — ABNORMAL HIGH (ref 70–99)
Glucose-Capillary: 322 mg/dL — ABNORMAL HIGH (ref 70–99)
Glucose-Capillary: 238 mg/dL — ABNORMAL HIGH (ref 70–99)

## 2021-11-05 MED ORDER — MIDAZOLAM HCL 2 MG/2ML IJ SOLN
1.0000 mg | INTRAMUSCULAR | Status: DC | PRN
  Filled 2021-11-05: qty 2

## 2021-11-05 MED ORDER — MIDAZOLAM HCL 2 MG/2ML IJ SOLN
INTRAMUSCULAR | Status: AC
  Filled 2021-11-05: qty 2

## 2021-11-05 MED ORDER — SODIUM CHLORIDE 0.9 % IV SOLN: 250.0000 mL | INTRAVENOUS | Status: DC

## 2021-11-05 MED ORDER — MIDAZOLAM-SODIUM CHLORIDE 100-0.9 MG/100ML-% IV SOLN
0.0000 mg/h | INTRAVENOUS | Status: DC
  Administered 2021-11-05: 2 mg/h via INTRAVENOUS
  Filled 2021-11-05: qty 100

## 2021-11-05 MED ORDER — CHLORHEXIDINE GLUCONATE CLOTH 2 % EX PADS
6.0000 | MEDICATED_PAD | Freq: Every day | CUTANEOUS | Status: DC
  Administered 2021-11-05 – 2021-11-06 (×2): 6 via TOPICAL

## 2021-11-05 MED ORDER — FENTANYL CITRATE PF 50 MCG/ML IJ SOSY
PREFILLED_SYRINGE | INTRAMUSCULAR | Status: AC
  Filled 2021-11-05: qty 2

## 2021-11-05 MED ORDER — ETOMIDATE 2 MG/ML IV SOLN
INTRAVENOUS | Status: AC
  Filled 2021-11-05: qty 20

## 2021-11-05 MED ORDER — IPRATROPIUM-ALBUTEROL 20-100 MCG/ACT IN AERS: 1.0000 | INHALATION_SPRAY | Freq: Four times a day (QID) | RESPIRATORY_TRACT | Status: DC

## 2021-11-05 MED ORDER — INSULIN DETEMIR 100 UNIT/ML ~~LOC~~ SOLN
10.0000 [IU] | Freq: Every day | SUBCUTANEOUS | Status: DC
  Administered 2021-11-05: 10 [IU] via SUBCUTANEOUS
  Filled 2021-11-05 (×2): qty 0.1

## 2021-11-05 MED ORDER — FENTANYL CITRATE PF 50 MCG/ML IJ SOSY
25.0000 ug | PREFILLED_SYRINGE | Freq: Once | INTRAMUSCULAR | Status: AC
  Administered 2021-11-05: 25 ug via INTRAVENOUS

## 2021-11-05 MED ORDER — SODIUM CHLORIDE 0.9 % IV SOLN
3.0000 g | Freq: Four times a day (QID) | INTRAVENOUS | Status: DC
  Administered 2021-11-05 – 2021-11-06 (×5): 3 g via INTRAVENOUS
  Filled 2021-11-05 (×3): qty 8
  Filled 2021-11-05: qty 3
  Filled 2021-11-05 (×5): qty 8

## 2021-11-05 MED ORDER — MIDAZOLAM BOLUS VIA INFUSION: 0.0000 mg | INTRAVENOUS | Status: DC | PRN

## 2021-11-05 MED ORDER — FENTANYL 2500MCG IN NS 250ML (10MCG/ML) PREMIX INFUSION
25.0000 ug/h | INTRAVENOUS | Status: DC
  Administered 2021-11-05: 25 ug/h via INTRAVENOUS
  Filled 2021-11-05: qty 250

## 2021-11-05 MED ORDER — NOREPINEPHRINE 4 MG/250ML-% IV SOLN
2.0000 ug/min | INTRAVENOUS | Status: DC
  Administered 2021-11-05: 2 ug/min via INTRAVENOUS
  Administered 2021-11-06: 9 ug/min via INTRAVENOUS
  Filled 2021-11-05 (×3): qty 250

## 2021-11-05 MED ORDER — PROPOFOL 1000 MG/100ML IV EMUL
0.0000 ug/kg/min | INTRAVENOUS | Status: DC
  Administered 2021-11-05: 50 ug/kg/min via INTRAVENOUS
  Administered 2021-11-05: 20 ug/kg/min via INTRAVENOUS
  Filled 2021-11-05 (×2): qty 100

## 2021-11-05 MED ORDER — POLYETHYLENE GLYCOL 3350 17 G PO PACK: 17.0000 g | PACK | Freq: Every day | ORAL | Status: DC

## 2021-11-05 MED ORDER — SODIUM CHLORIDE 0.9 % IV BOLUS
1000.0000 mL | Freq: Once | INTRAVENOUS | Status: AC
  Administered 2021-11-05: 1000 mL via INTRAVENOUS

## 2021-11-05 MED ORDER — MIDAZOLAM HCL 2 MG/2ML IJ SOLN
1.0000 mg | INTRAMUSCULAR | Status: DC | PRN
  Administered 2021-11-05 – 2021-11-06 (×3): 1 mg via INTRAVENOUS

## 2021-11-05 MED ORDER — DOCUSATE SODIUM 50 MG/5ML PO LIQD
100.0000 mg | Freq: Two times a day (BID) | ORAL | Status: DC
  Administered 2021-11-05 – 2021-11-06 (×2): 100 mg
  Filled 2021-11-05 (×2): qty 10

## 2021-11-05 MED ORDER — POLYETHYLENE GLYCOL 3350 17 G PO PACK
17.0000 g | PACK | Freq: Every day | ORAL | Status: DC
  Administered 2021-11-05: 17 g
  Filled 2021-11-05: qty 1

## 2021-11-05 MED ORDER — SUCCINYLCHOLINE CHLORIDE 200 MG/10ML IV SOSY
PREFILLED_SYRINGE | INTRAVENOUS | Status: AC
  Filled 2021-11-05: qty 10

## 2021-11-05 MED ORDER — POLYETHYLENE GLYCOL 3350 17 G PO PACK
17.0000 g | PACK | Freq: Every day | ORAL | Status: DC
  Administered 2021-11-05 – 2021-11-06 (×2): 17 g
  Filled 2021-11-05 (×2): qty 1

## 2021-11-05 MED ORDER — DOCUSATE SODIUM 50 MG/5ML PO LIQD
100.0000 mg | Freq: Two times a day (BID) | ORAL | Status: DC
  Administered 2021-11-05: 100 mg
  Filled 2021-11-05 (×4): qty 10

## 2021-11-05 MED ORDER — FENTANYL BOLUS VIA INFUSION
25.0000 ug | INTRAVENOUS | Status: DC | PRN
  Administered 2021-11-05: 50 ug via INTRAVENOUS
  Filled 2021-11-05: qty 100

## 2021-11-05 MED ORDER — DOCUSATE SODIUM 50 MG/5ML PO LIQD: 100.0000 mg | Freq: Two times a day (BID) | ORAL | Status: DC

## 2021-11-05 MED ORDER — ROCURONIUM BROMIDE 10 MG/ML (PF) SYRINGE
PREFILLED_SYRINGE | INTRAVENOUS | Status: AC
  Filled 2021-11-05: qty 10

## 2021-11-05 MED ORDER — IPRATROPIUM-ALBUTEROL 0.5-2.5 (3) MG/3ML IN SOLN
3.0000 mL | Freq: Four times a day (QID) | RESPIRATORY_TRACT | Status: DC
  Administered 2021-11-05 – 2021-11-06 (×4): 3 mL via RESPIRATORY_TRACT
  Filled 2021-11-05 (×4): qty 3

## 2021-11-05 MED ORDER — LORAZEPAM 2 MG/ML IJ SOLN
1.0000 mg | INTRAMUSCULAR | Status: DC | PRN
Start: 2021-11-05 — End: 2021-11-06
  Administered 2021-11-05: 1 mg via INTRAVENOUS
  Filled 2021-11-05: qty 1

## 2021-11-05 NOTE — Progress Notes (Signed)
Progress Note   Patient: Rick Cruz:[id number] DOB: 1946/01/27 DOA: 11/09/2021     3 DOS: the patient was seen and examined on 11/05/2021   Brief hospital course: Rick Cruz was admitted to the hospital with the working diagnosis of acute metabolic encephalopathy due to urine infection. Complicated with aspiration pneumonia and cardiac arrest.   76 yo male with the past medical history of osteoarthritis, CVA, right eye blindness, COPD, diastolic heart failure, B4WH, atrial fibrillation, HTN and chronic hypoxemic respiratory failure on home 02 who presented with altered mental status. Patient was noted to be confused, his blood pressure and glucose were elevated and he was transferred from SNF to the ED for further evaluation. On his initial physical examination his HR was 120, bp 126/96, temp 98 and oxygen saturation 100%. He was lethargic but able to answer some questions, lungs clear to auscultation, heart with S1 and S2 present and rhythmic, abdomen soft with mild suprapubic tenderness, and no lower extremity edema.   Na 150, K 4,4. CL 111. Bicarb 27, glucose 496, bun 27 and cr 1,41 Wbc 11,1 hgb 10,1, hct 36 plt 205  Sars covid 19 negative   Urine analysis with sg 1,017 with >50 wbc  Head CT with no acute changes.   Chest radiograph with no infiltrates  EKG 124 bpm, left axis deviation, right bundle branch block, sinus rhythm, with no significant ST segment or T wave changes.   Patient was placed on IV fluids and antibiotic therapy for urinary tract infection.  Insulin therapy for hyperglycemia.  His mentation improved back to his baseline.   02/19 patient had a episode of severe aspiration pneumonia leading to severe hypoxemia and cardiac arrest.  CODE blue activated and after 10 minutes he recovered his pulse, placed on invasive mechanical ventilation and transferred to the ICU.   Assessment and Plan: * Aspiration pneumonia (Rick Cruz)- (present on admission) Acute hypoxemic and  hypercapnic respiratory failure.  Sp cardiac arrest.   Chest film post cardiac arrest personally reviewed with interstitial infiltrates at lower lobes but more prominent at the right lower lobe. ET tube is about 55 mm above the carina.  ABG 7,17 Pc02 76, Pa02 139, bicarbonate 27 and 02 saturation 99%.   Mechanical ventilator. Mode PRVC Peep 5  RR 20 Generating VT 620 ml Peak pressure 43 and plateau pressure of 26.   Plan to add antibiotic therapy with Unasy. Sedation with propofol to improve ventilator synchrony.  Advance ET tube 2 cm.  Add bronchodilator therapy q 6 hrs No signs of air trapping on ventilator flow wave form.  Check ABG in 2 hours and follow up chest film after repositioning ET tube. 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    UTI (urinary tract infection)- (present on admission) Urine analysis positive for pyuria  Patient completed 3 dosed of ceftriaxone. Now critically ill will place a foley cathter.   Acute encephalopathy- (present on admission) Post cardiac arrest with worsening encephalopathy, after 10 minutes of CPR there is risk of anoxic brain injury.  Continue sedation with propofol for 24 hrs and reassess neurologic function tomorrow morning.   Will address code status with his wife and mother.    AKI (acute kidney injury) (Rick Cruz)- (present on admission) Hypernatremia  Will check BMP today, his electrolytes have been improving.   CVA (cerebral vascular accident) Rick Cruz)- (present on admission) Continue neuro checks per unit protocol, possible new anoxic brain injury   Paroxysmal atrial fibrillation (Rick Cruz)- (present on admission) Continue  with metoprolol and apixaban per NG tube.   Essential hypertension- (present on admission) Hold metoprolol for now, continue blood pressure monitoring.    Type 2 diabetes mellitus without complication, with long-term current use of insulin (Rick Cruz) Persistent hyperglycemia. Continue insulin sliding  scale for glucose cover and monitoring.  Patient NPO for now due to critical condition.    Chronic respiratory failure 02 dep with hypercarbia- (present on admission) Acute on chronic respiratory failure Continue with invasive mechanical ventilation.   Obstructive sleep apnea- (present on admission) Cpap as outpatient   Paranoid schizophrenia (Rick Cruz)- (present on admission) Holding duloxetine, escitalopram, gabapentin and quetiapine.  Patient critically ill.   CAD (coronary artery disease)- (present on admission) No signs of acute coronary syndrome.   Class 1 obesity- (present on admission) Calculated BMI 31,19    Iron deficiency anemia- (present on admission) Stable cell count.   Pressure injury of skin- (present on admission) Continue with local care.         Subjective: Patient sedated and not responsive   Physical Exam: Vitals:   11/04/21 1337 11/04/21 2108 11/05/21 0525 11/05/21 1000  BP: (!) 99/55 (!) 154/78 (!) 116/53 (!) 222/133  Pulse: 60 65 65 (!) 117  Resp: 17 18 18 20   Temp: (!) 97.5 F (36.4 C) 98.3 F (36.8 C) (!) 97 F (36.1 C)   TempSrc: Oral Oral    SpO2: 99% 99%  100%  Weight:   94.6 kg   Height:       Neurology sedated and not responsive ENT ET tube in place Cardiovascular with S1 and S2 present and rhythmic, with no murmurs, rubs or gallops No JVD No lower extremity edema Respiratory with rales at bases but more right than left, no wheezing, or rhonchi Abdomen soft  Data Reviewed:    Family Communication: I spoke with patient's wife at the bedside (mother over the phone), we talked in detail about patient's condition, plan of care and prognosis and all questions were addressed.   Disposition: Status is: Inpatient Remains inpatient appropriate because: Critically ill        Planned Discharge Destination:  to be determined      Critical care time  60 minutes. Patient critically ill on mechanical ventilation.    Author: Tawni Millers, MD 11/05/2021 11:23 AM  For on call review [email or web address].

## 2021-11-05 NOTE — Progress Notes (Addendum)
Called back to patient's room for desat episode after changing FIO2 to 50% on ventilator.  Raised FIO2 to 70%, after hitting O2 breath button twice and RN changed location of pulse ox to ear.  Patient's O2 came up to 98%.  Left patient at 70% for now and will titrate when patient can tolerate.

## 2021-11-05 NOTE — Progress Notes (Addendum)
Patient with jerking movements and dyssynchrony with the ventilator despite high doses of propofol infusion.  Likely signs of neurologic dysfunction related to cardiac arrest. Patient with poor prognosis.   Plan to change sedation to IV midazolam and fentanyl infusions.

## 2021-11-05 NOTE — Progress Notes (Signed)
CSW was notified by RN that patient's wife does not want him to return to [place] at discharge. CSW sent patient's clinicals to [facility] for review.  Madilyn Fireman, MSW, LCSW Transitions of Care   Clinical Social Worker II [phone]

## 2021-11-05 NOTE — Progress Notes (Signed)
Date and time results received: 11/05/21 1645 (use smartphrase ".now" to insert current time)  Test: ABG Critical Value: Co2 69  Name of Provider Notified: Arrien, MD.  Orders Received? Or Actions Taken?: Orders Received - See Orders for details

## 2021-11-05 NOTE — Progress Notes (Signed)
Pharmacy Antibiotic Note  Rick Cruz is a 76 y.o. male admitted on 11/01/2021 with pneumonia.  Pharmacy has been consulted for UNASYN dosing.  Plan: Unasyn 3gm IV q6hrs  Height: 6' (182.9 cm) Weight: 94.6 kg (208 lb 8.9 oz) IBW/kg (Calculated) : 77.6  Temp (24hrs), Avg:97.6 F (36.4 C), Min:97 F (36.1 C), Max:98.3 F (36.8 C)  Recent Labs  Lab 11/12/2021 1245 11/03/21 0458 11/04/21 0701  WBC 11.1* 10.0 8.5  CREATININE 1.41* 1.30* 1.06    Estimated Creatinine Clearance: 73 mL/min (by C-G formula based on SCr of 1.06 mg/dL).    Allergies  Allergen Reactions   Phenergan [Promethazine Hcl] Other (See Comments)    Becomes very confused and aggressive   Haldol [Haloperidol Lactate] Other (See Comments)    Shaking    Metformin And Related Diarrhea    Antimicrobials this admission: Unasyn 2/19 >>  Rocephin 2/17 >> 2/19  Dose adjustments this admission:   Microbiology results:  BCx: pending  UCx:    Sputum:    MRSA PCR:   Thank you for allowing pharmacy to be a part of this patients care.  Hart Robinsons A 11/05/2021 9:32 AM

## 2021-11-05 NOTE — Assessment & Plan Note (Signed)
Acute hypoxemic and hypercapnic respiratory failure.  Sp cardiac arrest.   Chest film post cardiac arrest personally reviewed with interstitial infiltrates at lower lobes but more prominent at the right lower lobe. ET tube is about 55 mm above the carina.  ABG 7,17 Pc02 76, Pa02 139, bicarbonate 27 and 02 saturation 99%.   Mechanical ventilator. Mode PRVC Peep 5  RR 20 Generating VT 620 ml Peak pressure 43 and plateau pressure of 26.   Plan to add antibiotic therapy with Unasy. Sedation with propofol to improve ventilator synchrony.  Advance ET tube 2 cm.  Add bronchodilator therapy q 6 hrs No signs of air trapping on ventilator flow wave form.  Check ABG in 2 hours and follow up chest film after repositioning ET tube. 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

## 2021-11-05 NOTE — Progress Notes (Signed)
CODE BLUE  Patient found with vomiting in his mouth and his bed, unconscious and not responsive.  No pulse and no breathing, CODE blue was activated.  Patient on pulseless electrical activity. Started ACLS protocol, including IV epinephrine, bicarbonate and magnesium Advance airway was placed for ventilation and oxygenation.  Patient recovered spontaneous circulation and was transferred to the ICU For details please refer to the CODE blue flowsheet documentation.

## 2021-11-05 NOTE — Progress Notes (Signed)
Date and time results received: 11/05/21 1055 (use smartphrase ".now" to insert current time)  Test: ABG Critical Value: pH- 7.17 cO2-76  Name of Provider Notified: Arrien, MD.  Orders Received? Or Actions Taken?: Orders Received - See Orders for details

## 2021-11-05 NOTE — ED Provider Notes (Signed)
[place]  Department of Emergency Medicine   Code Blue CONSULT NOTE  Chief Complaint: Cardiac arrest/unresponsive   Level V Caveat: Unresponsive  History of present illness: I was contacted by the hospital for a CODE BLUE cardiac arrest upstairs and presented to the patient's bedside.  Patient was being bagged and the internist was running the code.  He has already been given 2 doses of epinephrine and remained pulseless not shockable rhythm PEA.  ROS: Unable to obtain, Level V caveat  Scheduled Meds:  apixaban  5 mg Oral BID   aspirin EC  81 mg Oral Daily   docusate sodium  100 mg Oral BID   DULoxetine  30 mg Oral q morning   escitalopram  10 mg Oral Daily   etomidate       fluticasone furoate-vilanterol  1 puff Inhalation Daily   And   umeclidinium bromide  1 puff Inhalation Daily   gabapentin  300 mg Oral QHS   hydrocortisone cream   Topical BID   insulin aspart  0-15 Units Subcutaneous TID WC   insulin aspart  0-5 Units Subcutaneous QHS   insulin aspart  15 Units Subcutaneous Once   insulin detemir  20 Units Subcutaneous QHS   melatonin  6 mg Oral QHS   memantine  28 mg Oral Daily   metoprolol tartrate  25 mg Oral BID   multivitamin-lutein  1 capsule Oral Daily   pantoprazole  40 mg Oral Daily   QUEtiapine  25 mg Oral BID   rocuronium bromide       Continuous Infusions:  cefTRIAXone (ROCEPHIN)  IV Stopped (11/04/21 1736)   PRN Meds:.acetaminophen, albuterol 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   CHF (congestive heart failure) (HCC)    diastolic    Chronic pain    COPD (chronic obstructive pulmonary disease) ([place])    Depression    Diabetes mellitus without complication (HCC)    insulin pump   GERD (gastroesophageal reflux disease)    Hypercholesterolemia    Hypertension    MDD (major depressive disorder)    On home O2    3 liters   Oxygen deficiency    Paroxysmal atrial  fibrillation ([place])    Schizophrenia ([place])    Sleep apnea    noncompliant with BiPAP   Sleep apnea    Stroke [facility])    Tobacco abuse    Past Surgical History:  Procedure Laterality Date   COLON SURGERY  March 2010   secondary to large colon polyp, final path per discharge summary notes tubulovillous adenoma.    COLONOSCOPY  July 2011   Dr. Benson [place]: multiple polyps, internal and external hemorrhoids, diverticulosis. tubular adenoma   ESOPHAGOGASTRODUODENOSCOPY (EGD) WITH PROPOFOL N/A 04/20/2013   Procedure: ESOPHAGOGASTRODUODENOSCOPY (EGD) WITH PROPOFOL;  Surgeon: Daneil Dolin, MD;  Location: [facility];  Service: Endoscopy;  Laterality: N/A;   KNEE SURGERY     X2   Social History   Socioeconomic History   Marital status: Married    Spouse name: Not on file   Number of children: Not on file   Years of education: Not on file   Highest education level: Not on file  Occupational History   Occupation: Retired  Tobacco Use   Smoking status: Former    Packs/day: 1.00    Years: 50.00    Pack years: 50.00    Types: Cigarettes  Quit date: 09/16/2013    Years since quitting: 8.1   Smokeless tobacco: Never  Vaping Use   Vaping Use: Never used  Substance and Sexual Activity   Alcohol use: No    Comment: history of ETOH abuse in remote past, none in at least 10 years   Drug use: No   Sexual activity: Yes  Other Topics Concern   Not on file  Social History Narrative   Drinks about 4 cups of caffeine daily. 2 cups of coffee and 2 coke zeros.  Pt lives in [place] with wife [name] [phone]) and mother.   Social Determinants of Health   Financial Resource Strain: Not on file  Food Insecurity: Not on file  Transportation Needs: Not on file  Physical Activity: Not on file  Stress: Not on file  Social Connections: Not on file  Intimate Partner Violence: Not on file   Allergies  Allergen Reactions   Phenergan [Promethazine Hcl] Other (See Comments)    Becomes very confused  and aggressive   Haldol [Haloperidol Lactate] Other (See Comments)    Shaking    Metformin And Related Diarrhea    Last set of Vital Signs (not current) Vitals:   11/04/21 2108 11/05/21 0525  BP: (!) 154/78 (!) 116/53  Pulse: 65 65  Resp: 18 18  Temp: 98.3 F (36.8 C) (!) 97 F (36.1 C)  SpO2: 99%       Physical Exam  Gen: unresponsive Cardiovascular: pulseless  Resp: apneic. Breath sounds equal bilaterally with bagging  Abd: nondistended  Neuro: GCS 3, unresponsive to pain  HEENT: No blood in posterior pharynx, gag reflex absent  Neck: No crepitus  Musculoskeletal: No deformity  Skin: warm  Procedures  INTUBATION Performed by: Luna Fuse Required items: required blood products, implants, devices, and special equipment available Patient identity confirmed: provided demographic data and hospital-assigned identification number Time out: Immediately prior to procedure a "time out" was called to verify the correct patient, procedure, equipment, support staff and site/side marked as required. Indications: Cardiopulmonary arrest Intubation method: Video laryngoscopy Preoxygenation: BVM Sedatives: Etomidate 10 mg Paralytic: Rocuronium 100 mg Tube Size: 7.5 cuffed Post-procedure assessment: chest rise and ETCO2 monitor Breath sounds: equal and absent over the epigastrium Tube secured by Respiratory Therapy Patient tolerated the procedure well with no immediate complications.  CRITICAL CARE Performed by: Luna Fuse Total critical care time: 20 minutes Critical care time was exclusive of separately billable procedures and treating other patients. Critical care was necessary to treat or prevent imminent or life-threatening deterioration. Critical care was time spent personally by me on the following activities: development of treatment plan with patient and/or surrogate as well as nursing, discussions with consultants, evaluation of patient's response to treatment,  examination of patient, obtaining history from patient or surrogate, ordering and performing treatments and interventions, ordering and review of laboratory studies, ordering and review of radiographic studies, pulse oximetry and re-evaluation of patient's condition.  Cardiopulmonary Resuscitation (CPR) Procedure Note  Directed/Performed by: Luna Fuse I personally directed ancillary staff and/or performed CPR in an effort to regain return of spontaneous circulation and to maintain cardiac, neuro and systemic perfusion.    Medical Decision making  Upon arrival patient was in cardiopulmonary arrest with CPR being administered.  Additional epinephrine given per algorithm.  Bicarb given as well.  As I took over the airway, on visualization there was a pool of vomitus in the airway with chunks of food.  This was suctioned out to the best  of my ability.  7.5 ET tube advanced to 23 at the teeth under direct visualization through the cords.  Breath sounds were equal afterwards.  On subsequent pulse check he had return of spontaneous circulation.  Tube was secured and remainder of the code and work-up turned back over to the internal medicine physician.  Assessment and Plan  Patient status post cardiopulmonary arrest with return of circulation.  He remains intubated and in critical condition and will need to be transferred to the ICU by the admission team.      Luna Fuse, MD 11/05/21 [phone]

## 2021-11-05 NOTE — Progress Notes (Signed)
Patient has developed hypotension, likely due to sedation.  Follow ABG with pH 7,27   I spoke with patient's family at the bedside, patient with poor prognosis possible anoxic brain injury. CODE status changed to DNR.   Add low dose peripheral norepinephrine to allow continue sedation.

## 2021-11-05 NOTE — Progress Notes (Signed)
This morning patient was asleep but woke up and swallowed pills with no difficulty and the nurse tech set his breakfast tray, then he fed himself all of his breakfast..  At 0841 telemetry called and said that he had a pulse drop to 30. Entered patient's room and found him unresponsive.  Code blue activated and chest compressions started with eventual return of pulse. Patient transferred to ICU,   Spoke with wife to let her know of current condition and she will have family drive her to hospital   Report given to [place] in ICU

## 2021-11-06 ENCOUNTER — Inpatient Hospital Stay (HOSPITAL_COMMUNITY): Payer: Medicare Other

## 2021-11-06 DIAGNOSIS — J9611 Chronic respiratory failure with hypoxia: Secondary | ICD-10-CM | POA: Diagnosis not present

## 2021-11-06 DIAGNOSIS — Z7189 Other specified counseling: Secondary | ICD-10-CM

## 2021-11-06 DIAGNOSIS — E669 Obesity, unspecified: Secondary | ICD-10-CM

## 2021-11-06 DIAGNOSIS — N179 Acute kidney failure, unspecified: Secondary | ICD-10-CM | POA: Diagnosis not present

## 2021-11-06 DIAGNOSIS — A419 Sepsis, unspecified organism: Secondary | ICD-10-CM

## 2021-11-06 DIAGNOSIS — Z66 Do not resuscitate: Secondary | ICD-10-CM

## 2021-11-06 DIAGNOSIS — J690 Pneumonitis due to inhalation of food and vomit: Secondary | ICD-10-CM | POA: Diagnosis not present

## 2021-11-06 DIAGNOSIS — L89301 Pressure ulcer of unspecified buttock, stage 1: Secondary | ICD-10-CM

## 2021-11-06 DIAGNOSIS — Z515 Encounter for palliative care: Secondary | ICD-10-CM

## 2021-11-06 DIAGNOSIS — G4733 Obstructive sleep apnea (adult) (pediatric): Secondary | ICD-10-CM

## 2021-11-06 DIAGNOSIS — G9340 Encephalopathy, unspecified: Secondary | ICD-10-CM | POA: Diagnosis not present

## 2021-11-06 DIAGNOSIS — R6520 Severe sepsis without septic shock: Secondary | ICD-10-CM

## 2021-11-06 LAB — CBC
HCT: 31.3 % — ABNORMAL LOW (ref 39.0–52.0)
Hemoglobin: 8.7 g/dL — ABNORMAL LOW (ref 13.0–17.0)
MCH: 22.1 pg — ABNORMAL LOW (ref 26.0–34.0)
MCHC: 27.8 g/dL — ABNORMAL LOW (ref 30.0–36.0)
MCV: 79.4 fL — ABNORMAL LOW (ref 80.0–100.0)
Platelets: 237 10*3/uL (ref 150–400)
RBC: 3.94 MIL/uL — ABNORMAL LOW (ref 4.22–5.81)
RDW: 15.1 % (ref 11.5–15.5)
WBC: 29.6 10*3/uL — ABNORMAL HIGH (ref 4.0–10.5)
nRBC: 0 % (ref 0.0–0.2)

## 2021-11-06 LAB — GLUCOSE, CAPILLARY
Glucose-Capillary: 383 mg/dL — ABNORMAL HIGH (ref 70–99)
Glucose-Capillary: 351 mg/dL — ABNORMAL HIGH (ref 70–99)

## 2021-11-06 LAB — BLOOD GAS, ARTERIAL
Acid-Base Excess: 2.7 mmol/L — ABNORMAL HIGH (ref 0.0–2.0)
Bicarbonate: 31.7 mmol/L — ABNORMAL HIGH (ref 20.0–28.0)
Drawn by: 2701
FIO2: 100 %
O2 Saturation: 99.8 %
Patient temperature: 37
pCO2 arterial: 69 mmHg (ref 32–48)
pH, Arterial: 7.27 — ABNORMAL LOW (ref 7.35–7.45)
pO2, Arterial: 213 mmHg — ABNORMAL HIGH (ref 83–108)

## 2021-11-06 LAB — BASIC METABOLIC PANEL
Anion gap: 8 (ref 5–15)
BUN: 21 mg/dL (ref 8–23)
CO2: 26 mmol/L (ref 22–32)
Calcium: 7.7 mg/dL — ABNORMAL LOW (ref 8.9–10.3)
Chloride: 101 mmol/L (ref 98–111)
Creatinine, Ser: 1.64 mg/dL — ABNORMAL HIGH (ref 0.61–1.24)
GFR, Estimated: 44 mL/min — ABNORMAL LOW (ref >60)
Glucose, Bld: 349 mg/dL — ABNORMAL HIGH (ref 70–99)
Potassium: 4.2 mmol/L (ref 3.5–5.1)
Sodium: 135 mmol/L (ref 135–145)

## 2021-11-06 LAB — TRIGLYCERIDES: Triglycerides: 171 mg/dL — ABNORMAL HIGH (ref <150)

## 2021-11-06 MED ORDER — POLYVINYL ALCOHOL 1.4 % OP SOLN: 1.0000 [drp] | Freq: Four times a day (QID) | OPHTHALMIC | Status: DC | PRN

## 2021-11-06 MED ORDER — ORAL CARE MOUTH RINSE
15.0000 mL | OROMUCOSAL | Status: DC
  Administered 2021-11-06 (×5): 15 mL via OROMUCOSAL

## 2021-11-06 MED ORDER — QUETIAPINE FUMARATE 25 MG PO TABS
25.0000 mg | ORAL_TABLET | Freq: Two times a day (BID) | ORAL | Status: DC
  Administered 2021-11-06: 25 mg
  Filled 2021-11-06: qty 1

## 2021-11-06 MED ORDER — GLYCOPYRROLATE 0.2 MG/ML IJ SOLN
0.2000 mg | INTRAMUSCULAR | Status: DC
  Administered 2021-11-06: 0.2 mg via INTRAVENOUS
  Filled 2021-11-06: qty 1

## 2021-11-06 MED ORDER — INSULIN DETEMIR 100 UNIT/ML ~~LOC~~ SOLN
15.0000 [IU] | Freq: Two times a day (BID) | SUBCUTANEOUS | Status: DC
  Administered 2021-11-06: 15 [IU] via SUBCUTANEOUS
  Filled 2021-11-06 (×3): qty 0.15

## 2021-11-06 MED ORDER — SODIUM CHLORIDE 0.9 % IV SOLN
1.0000 mg/h | INTRAVENOUS | Status: DC
  Administered 2021-11-06: 1 mg/h via INTRAVENOUS
  Filled 2021-11-06 (×2): qty 5

## 2021-11-06 MED ORDER — LORAZEPAM 2 MG/ML IJ SOLN: 1.0000 mg | INTRAMUSCULAR | Status: DC | PRN

## 2021-11-06 MED ORDER — LORAZEPAM 2 MG/ML PO CONC
1.0000 mg | ORAL | Status: DC | PRN
  Filled 2021-11-06: qty 0.5

## 2021-11-06 MED ORDER — APIXABAN 5 MG PO TABS
5.0000 mg | ORAL_TABLET | Freq: Two times a day (BID) | ORAL | Status: DC
  Administered 2021-11-06: 5 mg
  Filled 2021-11-06: qty 1

## 2021-11-06 MED ORDER — MELATONIN 3 MG PO TABS: 6.0000 mg | ORAL_TABLET | Freq: Every day | ORAL | Status: DC

## 2021-11-06 MED ORDER — GABAPENTIN 300 MG PO CAPS: 300.0000 mg | ORAL_CAPSULE | Freq: Every day | ORAL | Status: DC

## 2021-11-06 MED ORDER — OCUVITE-LUTEIN PO CAPS
1.0000 | ORAL_CAPSULE | Freq: Every day | ORAL | Status: DC
  Administered 2021-11-06: 1
  Filled 2021-11-06: qty 1

## 2021-11-06 MED ORDER — ESCITALOPRAM OXALATE 10 MG PO TABS
10.0000 mg | ORAL_TABLET | Freq: Every day | ORAL | Status: DC
  Administered 2021-11-06: 10 mg
  Filled 2021-11-06: qty 1

## 2021-11-06 MED ORDER — METOPROLOL TARTRATE 25 MG PO TABS
25.0000 mg | ORAL_TABLET | Freq: Two times a day (BID) | ORAL | Status: DC
  Administered 2021-11-06: 25 mg
  Filled 2021-11-06: qty 1

## 2021-11-06 MED ORDER — CHLORHEXIDINE GLUCONATE 0.12% ORAL RINSE (MEDLINE KIT)
15.0000 mL | Freq: Two times a day (BID) | OROMUCOSAL | Status: DC
  Administered 2021-11-06 (×2): 15 mL via OROMUCOSAL

## 2021-11-06 MED ORDER — ACETAMINOPHEN 650 MG RE SUPP: 650.0000 mg | Freq: Four times a day (QID) | RECTAL | Status: DC | PRN

## 2021-11-06 MED ORDER — ACETAMINOPHEN 325 MG PO TABS
650.0000 mg | ORAL_TABLET | Freq: Four times a day (QID) | ORAL | Status: DC | PRN
  Administered 2021-11-06: 650 mg
  Filled 2021-11-06: qty 2

## 2021-11-06 MED ORDER — PANTOPRAZOLE 2 MG/ML SUSPENSION
40.0000 mg | Freq: Every day | ORAL | Status: DC
  Administered 2021-11-06: 40 mg
  Filled 2021-11-06: qty 20

## 2021-11-06 MED ORDER — HYDROMORPHONE BOLUS VIA INFUSION
0.5000 mg | INTRAVENOUS | Status: DC | PRN
  Administered 2021-11-06: 0.5 mg via INTRAVENOUS
  Filled 2021-11-06: qty 1

## 2021-11-06 MED ORDER — BIOTENE DRY MOUTH MT LIQD: 15.0000 mL | OROMUCOSAL | Status: DC | PRN

## 2021-11-06 MED ORDER — MUPIROCIN 2 % EX OINT
TOPICAL_OINTMENT | Freq: Two times a day (BID) | CUTANEOUS | Status: DC
  Administered 2021-11-06: 1 via NASAL
  Filled 2021-11-06: qty 22

## 2021-11-06 MED ORDER — ONDANSETRON HCL 4 MG/2ML IJ SOLN: 4.0000 mg | Freq: Four times a day (QID) | INTRAMUSCULAR | Status: DC | PRN

## 2021-11-06 MED FILL — Medication: Qty: 1 | Status: AC

## 2021-11-15 NOTE — Progress Notes (Signed)
PT Cancellation Note  Patient Details Name: Rick Cruz MRN: [id number] DOB: 1945-12-19   Cancelled Treatment:    Reason Eval/Treat Not Completed: Medical issues which prohibited therapy.  Patient transferred to a higher level of care and will need new PT consult to resume therapy when patient is medically stable.  Thank you.   7:38 AM, 2021/11/26 Lonell Grandchild, MPT Physical Therapist with [facility] [phone] office [phone] mobile phone

## 2021-11-15 NOTE — Progress Notes (Signed)
Inpatient Diabetes Program Recommendations  AACE/ADA: New Consensus Statement on Inpatient Glycemic Control   Target Ranges:  Prepandial:   less than 140 mg/dL      Peak postprandial:   less than 180 mg/dL (1-2 hours)      Critically ill patients:  140 - 180 mg/dL    Latest Reference Range & Units 2021/11/09 08:53 November 09, 2021 11:09 2021/11/09 16:15 2021-11-09 22:03  Glucose-Capillary 70 - 99 mg/dL 213 (H) 363 (H) 322 (H) 238 (H)    Review of Glycemic Control  Diabetes history: DM2 Outpatient Diabetes medications: Lantus 20 units QHS, Trulicity 3 mg Qweek Current orders for Inpatient glycemic control: Levemir 10 units QHS, Novolog 0-15 units TID with meals, Novolog 0-5 units QHS   Inpatient Diabetes Program Recommendations:    Insulin: Please consider changing Levemir to 15 units BID, change CBGs to Q4H, and Novolog to 0-20 units Q4H.  NOTE: Noted patient coded on November 09, 2021 and is now on ventilator.   Thanks, Barnie Alderman, RN, MSN, CDE Diabetes Coordinator Inpatient Diabetes Program [phone] (Team Pager from 8am to 5pm)

## 2021-11-15 NOTE — Progress Notes (Signed)
OT Cancellation Note  Patient Details Name: Rick Cruz MRN: [id number] DOB: Oct 15, 1945   Cancelled Treatment:    Reason Eval/Treat Not Completed: Medical issues which prohibited therapy. Medical issues which prohibited therapy.  Patient transferred to a higher level of care and will need new OT consult to resume therapy when patient is medically stable.  Thank you.  Ailene Ravel, OTR/L,CBIS  [phone]  11/24/2021, 8:14 AM

## 2021-11-15 NOTE — Death Summary Note (Signed)
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  Hospital Diagnoses: Principal Problem:   Aspiration pneumonia ([place]) Active Problems:   Obstructive sleep apnea   Essential hypertension   Paranoid schizophrenia ([place])   CAD (coronary artery disease)   Type 2 diabetes mellitus without complication, with long-term current use of insulin (HCC)   Chronic respiratory failure 02 dep with hypercarbia   Acute encephalopathy   Paroxysmal atrial fibrillation (HCC)   Class 1 obesity   Iron deficiency anemia   CVA (cerebral vascular accident) ([place])   UTI (urinary tract infection)   Pressure injury of skin   AKI (acute kidney injury) ([place])   Severe sepsis [facility])   Hospital Course: Mr. Sheckler was admitted to the hospital with the working diagnosis of acute metabolic encephalopathy due to urine infection. Complicated with aspiration pneumonia and cardiac arrest.   76 yo male with the past medical history of osteoarthritis, CVA, right eye blindness, COPD, diastolic heart failure, L4YT, atrial fibrillation, HTN and chronic hypoxemic respiratory failure on home 02 who presented with altered mental status. Patient was noted to be confused, his blood pressure and glucose were elevated and he was transferred from SNF to the ED for further evaluation. On his initial physical examination his HR was 120, bp 126/96, temp 98 and oxygen saturation 100%. He was lethargic but able to answer some questions, lungs clear to auscultation, heart with S1 and S2 present and rhythmic, abdomen soft with mild  suprapubic tenderness, and no lower extremity edema.   Na 150, K 4,4. CL 111. Bicarb 27, glucose 496, bun 27 and cr 1,41 Wbc 11,1 hgb 10,1, hct 36 plt 205  Sars covid 19 negative   Urine analysis with sg 1,017 with >50 wbc  Head CT with no acute changes.   Chest radiograph with no infiltrates  EKG 124 bpm, left axis deviation, right bundle branch block, sinus rhythm, with no significant ST segment or T wave changes.   Patient was placed on IV fluids and antibiotic therapy for urinary tract infection.  Insulin therapy for hyperglycemia.  His mentation improved back to his baseline.   02/19 patient had a episode of severe aspiration pneumonia leading to severe hypoxemia and cardiac arrest.  CODE blue activated and after 10-15 minutes he recovered his pulse, placed on invasive mechanical ventilation and was transferred to the ICU.  Patient with further declining throughout the night requiring pressors support and demonstrating acute kidney injury component suggesting multiorgan dysfunction.  Patient is spiked fever and demonstrated elevation in his WBCs along with tachycardia.  Patient has met criteria for severe sepsis.  02/20>> further discussion with his family members and involvement of palliative care reach to the decision of terminal extubation and transition to full comfort care.  At 1333 after extubation accomplished patient peacefully passed away.  Family was at bedside.  Assessment and Plan: * Aspiration pneumonia ([address])- (present on admission) Acute hypoxemic and hypercapnic respiratory failure.  Sp cardiac arrest.   Chest film post cardiac arrest personally reviewed with interstitial infiltrates at lower lobes but more prominent at the right lower lobe.  ET tube is about 55 mm above the carina.  ABG 7,17 Pc02 76, Pa02 139, bicarbonate 27 and 02 saturation 99%.   Mechanical ventilator. Mode PRVC Peep 5  RR 20 Generating VT 620 ml Peak pressure 43 and plateau pressure of 26.    Plan to add antibiotic therapy with Unasy. Sedation with propofol to improve ventilator synchrony.  Advance ET tube 2 cm.  Add bronchodilator therapy q 6 hrs No signs of air trapping on ventilator flow wave form.  Check ABG in 2 hours and follow up chest film after repositioning ET tube. 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    AKI (acute kidney injury) ([place])- (present on admission) Hypernatremia  Will check BMP today, his electrolytes have been improving.   Pressure injury of skin- (present on admission) Continue with local care.   UTI (urinary tract infection)- (present on admission) Urine analysis positive for pyuria  Patient completed 3 dosed of ceftriaxone. Now critically ill will place a foley cathter.   CVA (cerebral vascular accident) ([place])- (present on admission) Continue neuro checks per unit protocol, possible new anoxic brain injury   Iron deficiency anemia- (present on admission) Stable cell count.   Class 1 obesity- (present on admission) Calculated BMI 31,19    Paroxysmal atrial fibrillation ([place])- (present on admission) Continue with metoprolol and apixaban per NG tube.   Acute encephalopathy- (present on admission) Post cardiac arrest with worsening encephalopathy, after 10 minutes of CPR there is risk of anoxic brain injury.  Continue sedation with propofol for 24 hrs and reassess neurologic function tomorrow morning.   Will address code status with his wife and mother.    Chronic respiratory failure 02 dep with hypercarbia- (present on admission) Acute on chronic respiratory failure Continue with invasive mechanical ventilation.   Type 2 diabetes mellitus without complication, with long-term current use of insulin (HCC) Persistent hyperglycemia. Continue insulin sliding scale for glucose cover and monitoring.  Patient NPO for now due to critical condition.    CAD (coronary artery disease)- (present on admission) No signs  of acute coronary syndrome.   Paranoid schizophrenia ([place])- (present on admission) Holding duloxetine, escitalopram, gabapentin and quetiapine.  Patient critically ill.   Essential hypertension- (present on admission) Hold metoprolol for now, continue blood pressure monitoring.    Obstructive sleep apnea- (present on admission) Cpap as outpatient      Procedures: See below for x-ray reports.  Consultations: Palliative care and PCCM.  The results of significant diagnostics from this hospitalization (including imaging, microbiology, ancillary and laboratory) are listed below for reference.   Significant Diagnostic Studies: DG Chest 1 View  Result Date: 11/05/2021 CLINICAL DATA:  Intubation EXAM: CHEST  1 VIEW COMPARISON:  11/05/2021 FINDINGS: Interval endotracheal intubation, tube tip over the mid trachea. Esophagogastric intubation with tip and side port below the diaphragm. Mild cardiomegaly and mild, diffuse interstitial pulmonary opacity. No focal airspace opacity. Subacute to chronic fractures of the right lateral ribs, similar to prior examination. No new displaced rib fractures. IMPRESSION: 1. Interval endotracheal intubation, tube tip over the mid trachea. Esophagogastric intubation with tip and side port below the diaphragm. 2. Mild cardiomegaly and mild, diffuse interstitial pulmonary opacity, likely edema. No focal airspace opacity. Electronically Signed   By: Delanna Ahmadi M.D.   On: 11/05/2021 10:02   DG Chest 2 View  Result Date: 10/25/2021 CLINICAL DATA:  Altered mental status, evaluate for pneumonia EXAM: CHEST - 2 VIEW COMPARISON:  03/02/2021 FINDINGS: Cardiac size is within normal limits. There  are no signs of pulmonary edema or focal pulmonary consolidation. There is interval clearing of left lower lobe infiltrate. There is no significant pleural effusion or pneumothorax. IMPRESSION: No active cardiopulmonary disease. Electronically Signed   By: Elmer Picker M.D.    On: 10/23/2021 13:11   CT Head Wo Contrast  Result Date: 10/18/2021 CLINICAL DATA:  A 76 year old male presents for evaluation of mental status changes. EXAM: CT HEAD WITHOUT CONTRAST TECHNIQUE: Contiguous axial images were obtained from the base of the skull through the vertex without intravenous contrast. RADIATION DOSE REDUCTION: This exam was performed according to the departmental dose-optimization program which includes automated exposure control, adjustment of the mA and/or kV according to patient size and/or use of iterative reconstruction technique. COMPARISON:  November of 2022. FINDINGS: Brain: No evidence of acute infarction, hemorrhage, hydrocephalus, extra-axial collection or mass lesion/mass effect. Signs of extensive atrophy and chronic microvascular ischemic change. Findings are similar to previous imaging Vascular: No hyperdense vessel or unexpected calcification. Skull: Normal. Negative for fracture or focal lesion. Sinuses/Orbits: Visualized paranasal sinuses and orbits without acute process. Other: None IMPRESSION: 1. No acute intracranial process. 2. Signs of extensive atrophy and chronic microvascular ischemic change. Findings are similar to previous imaging. Electronically Signed   By: Zetta Bills M.D.   On: 11/10/2021 16:12   DG Chest 1V REPEAT Same Day  Result Date: 11/05/2021 CLINICAL DATA:  Endotracheal tube placement status post repositioning. EXAM: CHEST - 1 VIEW SAME DAY COMPARISON:  Earlier same day radiograph at 0933 hours FINDINGS: Of note, the patient's chin/head overlies the left apex. Endotracheal tube tip projects 2 cm above the carina. Enteric tube tip is below the diaphragm but not included on the image. Heart is normal in size. Aortic calcifications. Retrocardiac opacity in the left lower lobe is new compared to earlier same day radiograph. Additional interstitial opacities are noted in the right lung base. No pleural effusion or definite pneumothorax. Right  lateral rib fracture, similar to prior exams. No new osseous abnormality. IMPRESSION: 1. Endotracheal tube tip now projects 2 cm above the carina status post repositioning. 2. Interval development of retrocardiac airspace opacity in the left lower lobe as well as interstitial opacities at the right lung base may represent atelectasis, aspiration changes, or infection. 3.  Aortic Atherosclerosis (ICD10-I70.0). Electronically Signed   By: Ileana Roup M.D.   On: 11/05/2021 12:29   DG CHEST PORT 1 VIEW  Result Date: 11-21-2021 CLINICAL DATA:  OG tube placement. EXAM: PORTABLE CHEST 1 VIEW COMPARISON:  11/05/2021 FINDINGS: Endotracheal tube is in place approximately 4 centimeters above the carina. Orogastric tube courses towards the stomach, tip not imaged. Heart size is normal. Stable RIGHT apical pleural thickening. Lungs are free of focal consolidations. Scattered parenchymal opacities are stable. No pulmonary edema. Remote rib fractures. IMPRESSION: Orogastric tube tip not imaged. Endotracheal tube is in good position. Electronically Signed   By: Nolon Nations M.D.   On: 2021-11-21 08:10    Microbiology: Recent Results (from the past 240 hour(s))  Resp Panel by RT-PCR (Flu A&B, Covid) Nasopharyngeal Swab     Status: None   Collection Time: 10/31/2021 12:45 PM   Specimen: Nasopharyngeal Swab; Nasopharyngeal(NP) swabs in vial transport medium  Result Value Ref Range Status   SARS Coronavirus 2 by RT PCR NEGATIVE NEGATIVE Final    Comment: (NOTE) SARS-CoV-2 target nucleic acids are NOT DETECTED.  The SARS-CoV-2 RNA is generally detectable in upper respiratory specimens during the acute phase of infection. The lowest concentration of  SARS-CoV-2 viral copies this assay can detect is 138 copies/mL. A negative result does not preclude SARS-Cov-2 infection and should not be used as the sole basis for treatment or other patient management decisions. A negative result may occur with  improper specimen  collection/handling, submission of specimen other than nasopharyngeal swab, presence of viral mutation(s) within the areas targeted by this assay, and inadequate number of viral copies(<138 copies/mL). A negative result must be combined with clinical observations, patient history, and epidemiological information. The expected result is Negative.  Fact Sheet for Patients:  EntrepreneurPulse.com.au  Fact Sheet for Healthcare Providers:  IncredibleEmployment.be  This test is no t yet approved or cleared by the [place] FDA and  has been authorized for detection and/or diagnosis of SARS-CoV-2 by FDA under an Emergency Use Authorization (EUA). This EUA will remain  in effect (meaning this test can be used) for the duration of the COVID-19 declaration under Section 564(b)(1) of the Act, 21 U.S.C.section 360bbb-3(b)(1), unless the authorization is terminated  or revoked sooner.       Influenza A by PCR NEGATIVE NEGATIVE Final   Influenza B by PCR NEGATIVE NEGATIVE Final    Comment: (NOTE) The Xpert Xpress SARS-CoV-2/FLU/RSV plus assay is intended as an aid in the diagnosis of influenza from Nasopharyngeal swab specimens and should not be used as a sole basis for treatment. Nasal washings and aspirates are unacceptable for Xpert Xpress SARS-CoV-2/FLU/RSV testing.  Fact Sheet for Patients: EntrepreneurPulse.com.au  Fact Sheet for Healthcare Providers: IncredibleEmployment.be  This test is not yet approved or cleared by the [place] FDA and has been authorized for detection and/or diagnosis of SARS-CoV-2 by FDA under an Emergency Use Authorization (EUA). This EUA will remain in effect (meaning this test can be used) for the duration of the COVID-19 declaration under Section 564(b)(1) of the Act, 21 U.S.C. section 360bbb-3(b)(1), unless the authorization is terminated or revoked.  Performed at [facility], [address] [place], [place] [address]   MRSA Next Gen by PCR, Nasal     Status: Abnormal   Collection Time: 11/03/21 12:34 AM   Specimen: Nasal Mucosa; Nasal Swab  Result Value Ref Range Status   MRSA by PCR Next Gen DETECTED (A) NOT DETECTED Final    Comment: RESULT CALLED TO, READ BACK BY AND VERIFIED WITH: Carnero,T_0  by MATTHEWS, B 2.17.23 (NOTE) The GeneXpert MRSA Assay (FDA approved for NASAL specimens only), is one component of a comprehensive MRSA colonization surveillance program. It is not intended to diagnose MRSA infection nor to guide or monitor treatment for MRSA infections. Test performance is not FDA approved in patients less than 72 years old. Performed at [facility], [address], [place], [place] [address]     Time spent: 35 minutes  Signed: Barton Dubois, MD  Nov 26, 2021

## 2021-11-15 NOTE — Progress Notes (Signed)
90 mls of Dilaudid wasted with Sherry Ruffing, RN in steri-cycle in unit med room.

## 2021-11-15 NOTE — Procedures (Signed)
Extubation Procedure Note  Patient Details:   Name: Rick Cruz DOB: 08-28-1946 MRN: [id number]   Airway Documentation:    Vent end date: (not recorded) Vent end time: (not recorded)   Evaluation  O2 sats: currently acceptable Complications: No apparent complications Patient did tolerate procedure well. Bilateral Breath Sounds: Rhonchi   No  Patient terminally extubated per MD order   Blanchie Serve 2021-12-02, 1:24 PM

## 2021-11-15 NOTE — Consult Note (Signed)
Palliative Care Consult Note                                  Date: 11/26/2021   Patient Name: Rick Cruz  DOB: Nov 23, 1945  MRN: [id number]  Age / Sex: 76 y.o., male  PCP: Sherol Dade, DO Referring Physician: Vassie Loll, MD  Reason for Consultation: Establishing goals of care  HPI/Patient Profile: 76 y.o. male  with past medical history of osteoarthritis, CVA, right eye blindness, COPD, diastolic heart failure, T2DM, atrial fibrillation, HTN and chronic hypoxemic respiratory failure on home 02. He presented with AMS. He was admitted on 11/13/2021 with UTI and was receiving antibiotics. Yesterday he suffered an aspiration event and subsequent cardiac arrest.  They required 10 minutes of CPR before ROSC.  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  PMT was consulted for goals of care conversation.  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   CHF (congestive heart failure) (HCC)    diastolic    Chronic pain    COPD (chronic obstructive pulmonary disease) (HCC)    Depression    Diabetes mellitus without complication (HCC)    insulin pump   GERD (gastroesophageal reflux disease)    Hypercholesterolemia    Hypertension    MDD (major depressive disorder)    On home O2    3 liters   Oxygen deficiency    Paroxysmal atrial fibrillation (HCC)    Schizophrenia (HCC)    Sleep apnea    noncompliant with BiPAP   Sleep apnea    Stroke (HCC)    Tobacco abuse     Subjective:   This NP Wynne Dust reviewed medical records, received report from team, assessed the patient and then meet at the patient's bedside to discuss diagnosis, prognosis, GOC, EOL wishes disposition and options.  I met with the patient's wife Gavin Pound and son Luisa Hart at the bedside. The patient is unresponsive and not able to participate in discussions.   Concept of  Palliative Care was introduced as specialized medical care for people and their families living with serious illness.  If focuses on providing relief from the symptoms and stress of a serious illness.  The goal is to improve quality of life for both the patient and the family. Values and goals of care important to patient and family were attempted to be elicited.  Created space and opportunity for patient  and family to explore thoughts and feelings regarding current medical situation   Natural trajectory and current clinical status were discussed. Questions and concerns addressed. Patient  encouraged to call with questions or concerns.    Patient/Family Understanding of Illness: The patient's wife states "I know my husband is not there."  She notes that his body is functioning but has severe brain damage and not interacting.  Wife laments that he was doing okay until Saturday night when he had a nice visit.  Her last memory of interaction with him was that he was happy he may be going to [place] for skilled nursing care, rather than locally.  She notes the last thing that they said to each other is "I love you."  She seems to take comfort in this.  She states that the patient called his mom and he really enjoyed his conversation with her.  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  This is a prolonged discussion where we talked about options including full scope of care versus comfort focus with terminal extubation.  We explained the dying process, multisystem organ failure, and what to expect at end-of-life.  At the end of the discussion the wife indicates that she knows what the right decision is, but she is having a hard time saying it.  She wants to visit with her son privately for  some time before making a decision.  His wife was quite distraught and tearful.  I offered emotional and general support through therapeutic listening, therapeutic silence, empathy, therapeutic touch, sharing of stories, and other techniques.  I answered all questions and addressed all concerns to the best of my ability.  Life Review: The patient and his wife have been married for 16 years (they were divorced for 1 year in the midst of that but remarried).  They have 2 children and, unfortunately, their younger child passed away in December 17, 2015 at age 71 due to bilateral pulmonary emboli.  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  She notes that he loves Research [employer], trade union, listening to music (especially classic rock, country), likes to golf, plays guitar.  He also enjoyed working with his ham radio.  Patient Values: Faith, family  Goals: His wife shares that she wants him to be comfortable and does not want him to suffer.  Today's Discussion: In addition to the discussions described above we had a prolonged discussion about various topics including prognosis, explanation of clinical status, discussion of advance care planning (they have previously completed "5 wishes" and she notes that they both agree they only wanted to be resuscitated once and then a DNR, no feeding tube."  We discussed things to expect at end-of-life.  Her biggest concern is about him being peaceful.  She shares that they recently had to have their cat put to sleep and it was a very traumatic and uncomfortable death and she does not want that for her husband.  We also had some face-to-face discussion in her acknowledgment that she feels that he is already in heaven but just his physical form is still being kept alive by machines.  She is a woman of faith and is confident that she will see her husband again and having.  However, we discussed that dying is not difficult for patients but  rather difficult for ones that are left behind.  She was quite tearful during our entire discussion.  I provided emotional general support through therapeutic listening, empathy, sharing of stories, therapeutic touch, therapeutic silence, and jokes.  I answered all questions and addressed all concerns to the best of my ability.  Review of Systems  Unable to perform ROS: Intubated   Objective:   Primary Diagnoses: Present on Admission:  Obstructive sleep apnea  Essential hypertension  Paranoid schizophrenia ([place])  CAD (coronary artery disease)  Chronic respiratory failure 02 dep with hypercarbia  Acute encephalopathy  (Resolved) Acute cholecystitis  Pressure injury of skin  UTI (urinary tract infection)  CVA (cerebral vascular accident) ([place])  Iron deficiency anemia  Class 1 obesity  Paroxysmal atrial fibrillation (HCC)  AKI (acute kidney injury) ([place])  Aspiration pneumonia ([place])   Physical Exam Vitals and nursing note reviewed.  Constitutional:  Appearance: He is ill-appearing and toxic-appearing.     Interventions: He is sedated and intubated.  HENT:     Head: Normocephalic and atraumatic.  Cardiovascular:     Rate and Rhythm: Normal rate.  Pulmonary:     Effort: Pulmonary effort is normal. No respiratory distress. He is intubated.  Abdominal:     General: There is distension.     Palpations: Abdomen is soft.  Skin:    General: Skin is warm and dry.  Neurological:     Mental Status: He is unresponsive.    Vital Signs:  BP (!) 112/51    Pulse (!) 119    Temp (!) 100.7 F (38.2 C) (Axillary) Comment: notified nurse   Resp (!) 29    Ht 6' (1.829 m)    Wt 99 kg    SpO2 100%    BMI 29.60 kg/m   Palliative Assessment/Data: 10%    Advanced Care Planning:   Primary Decision Maker: NEXT OF KIN  Code Status/Advance Care Planning: DNR  A discussion was had today regarding advanced directives. Concepts specific to code status, artifical feeding and hydration,  continued IV antibiotics and rehospitalization was had.  The difference between a aggressive medical intervention path and a palliative comfort care path for this patient at this time was had.   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 Family, especially son, very distraught. After discussion, wife seems to understand and indicates likely compassionate extubation ("I know what the right answer is, it's just hard to say it").  Overall it seems the patient's wife is leaning towards compassionate extubation and comfort care.  They are provided with our phone number to call when they want to make a decision.  SUMMARY OF RECOMMENDATIONS   Remain DNR Continue to treat the treatable Await family decision about goals (continued aggressive care versus comfort care) Emotional support of patient's family Referral to spiritual care for support PMT will continue to follow  Symptom Management:  Per primary team PMT is available to assist as needed  Prognosis:  Unable to determine  Discharge Planning:  To Be Determined   Discussed with: Patient's family, medical team, nursing team, respiratory team, spiritual care team  ADDENDUM: Shortly after my visit I was notified that the patient's family has elected compassionate extubation.  They are waiting for his daughter-in-law to arrive.  After she arrived we confirmed decision.  I started a Dilaudid drip for comfort, stop Versed and fentanyl, started Robinul every 4 hours.  About 10 minutes after the patient was extubated he passed peacefully.  His wife notes that she could identify the moment that he "found pees" as his whole demeanor and posture changed.  She seemed to take solace in this.  Offered  condolences and continued support as needed.   Thank you for allowing [place] to participate in the care of OREOLUWA AIGNER PMT will continue to support holistically.  Time Total: 150 min  Greater than 50%  of this time was spent counseling and coordinating care related to the above assessment and plan.  Signed by: Walden Field, NP Palliative Medicine Team  Team Phone # [phone] (Nights/Weekends)  11-07-2021, 12:34 PM

## 2021-11-15 NOTE — Progress Notes (Signed)
Nutrition Brief Note  Chart reviewed. Pt with poor prognosis and possible anoxic brain injury. Case discussed with MD; no plans for TF.  No further nutrition interventions planned at this time.  Please re-consult as needed.   Loistine Chance, RD, LDN, [place] Registered Dietitian II Certified Diabetes Care and Education Specialist Please refer to [facility] for RD and/or RD on-call/weekend/after hours pager

## 2021-11-15 NOTE — Progress Notes (Signed)
Patient extubated to room air per palliative care orders. Family at bedside. Chaplain at bedside. Dilaudid, fentanyl and versed gtts wasted with Joellen Jersey, RN.

## 2021-11-15 NOTE — Progress Notes (Signed)
Referred by patient nurse and hospitalist that family made decision for full comfort care and therefore withdrawal of care today. Found patient wife present bedside along with several family members and her son, Saralyn Pilar. She was tearful but appropriate today and also shared aloud that she simply wanted to follow through with her husbands wishes. Patient passed shortly after extubation with family at bedside. Chaplain provided spiritual presence, prayer, opportunity for reflection and life review. Will continue to remain available in order to provide spiritual support and to assess for spiritual need.   Rev. Bennie Pierini, M.Div. [phone] Chaplain

## 2021-11-15 DEATH — deceased

## 2022-05-25 IMAGING — CT CT HEAD W/O CM
3 of 4 series · 14 of 47 positions shown, 16 images · non-contrast
Comparison: None.

CLINICAL DATA: Head trauma.  Combative patient

EXAM:
CT HEAD WITHOUT CONTRAST
CT MAXILLOFACIAL WITHOUT CONTRAST
CT CERVICAL SPINE WITHOUT CONTRAST
TECHNIQUE: Multidetector CT imaging of the head, cervical spine, and
maxillofacial structures were performed using the standard protocol
without intravenous contrast. Multiplanar CT image reconstructions
of the cervical spine and maxillofacial structures were also
generated.

[Series 2: head w o · axial · 0.45mm/px · z∈[+19,+144]mm · 8 of 31 slices shown, 10 images]
[im 3/31  brain]
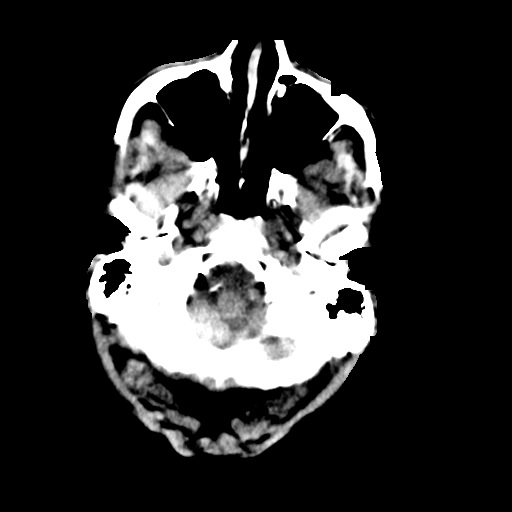
[im 3/31  bone]
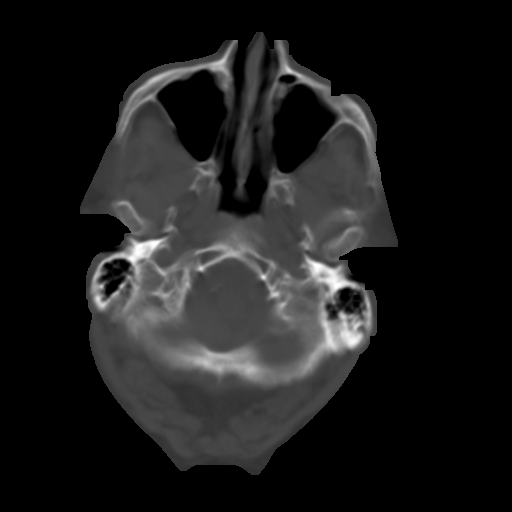
[im 7/31  brain]
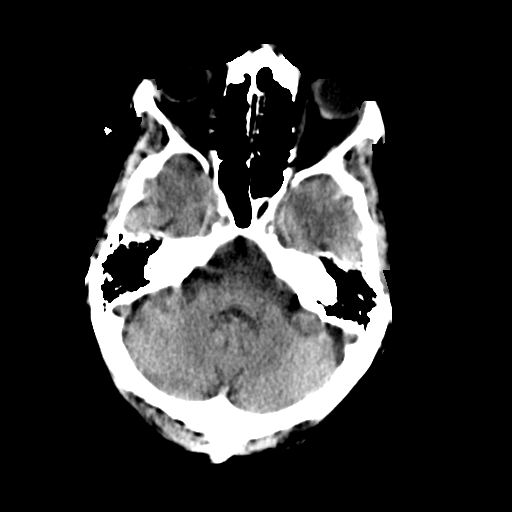
[im 11/31  brain]
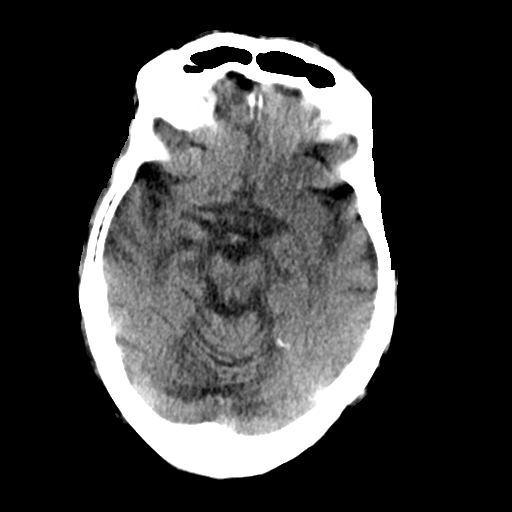
[im 13/31  brain]
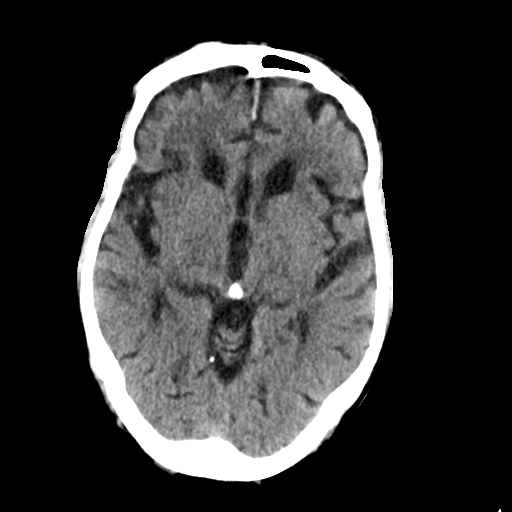
[im 18/31  brain]
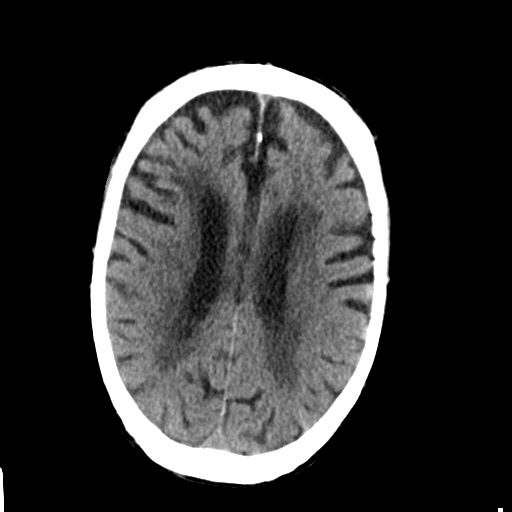
[im 18/31  bone]
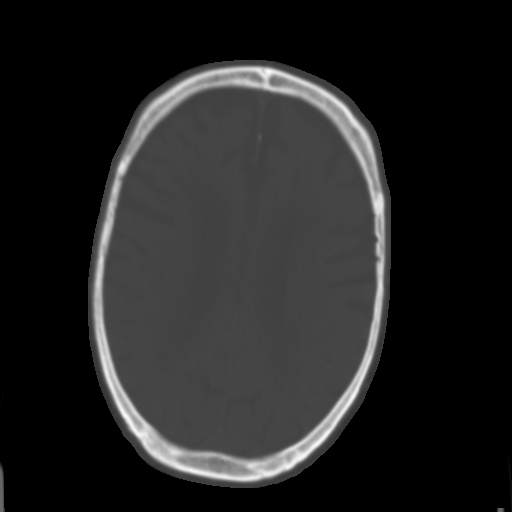
[im 20/31  brain]
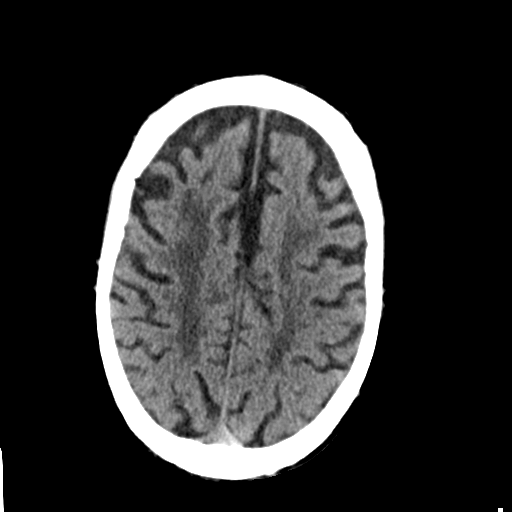
[im 24/31  brain]
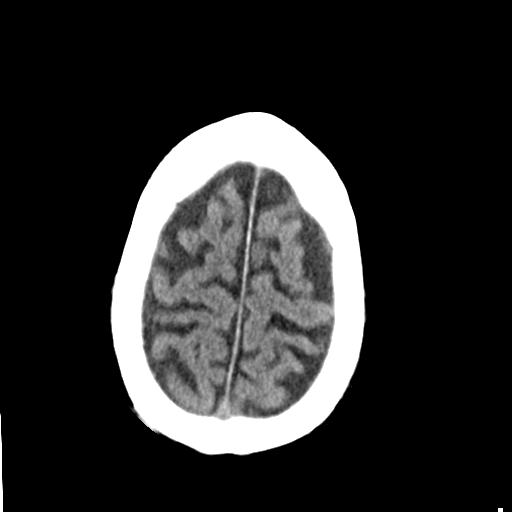
[im 28/31  brain]
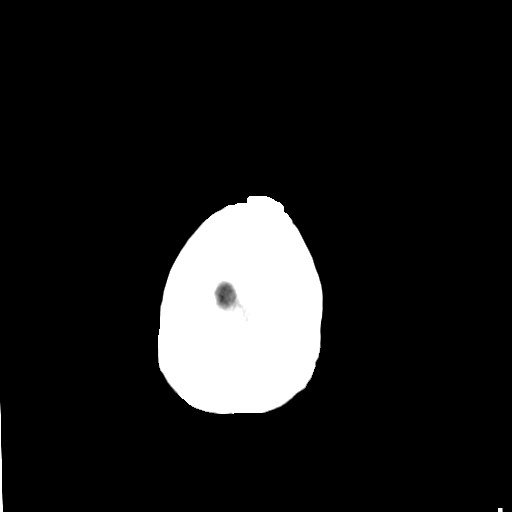

[Series 5: coronal soft · coronal · 0.36mm/px · 3 of 73 slices shown]
[im 25/73  brain]
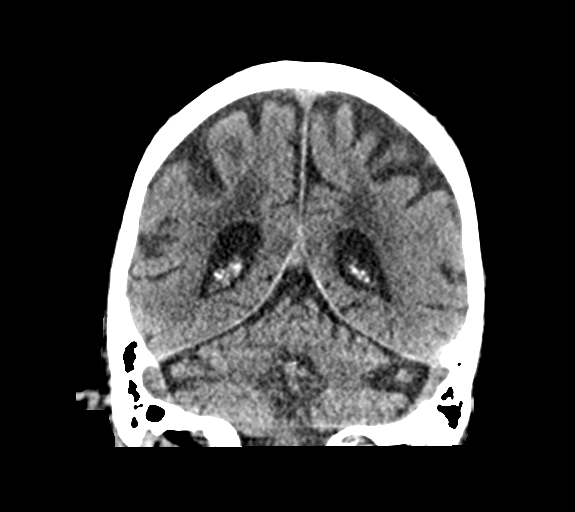
[im 33/73  brain]
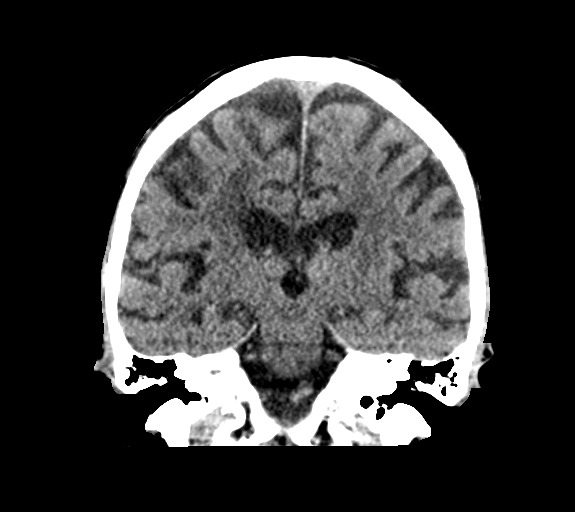
[im 41/73  brain]
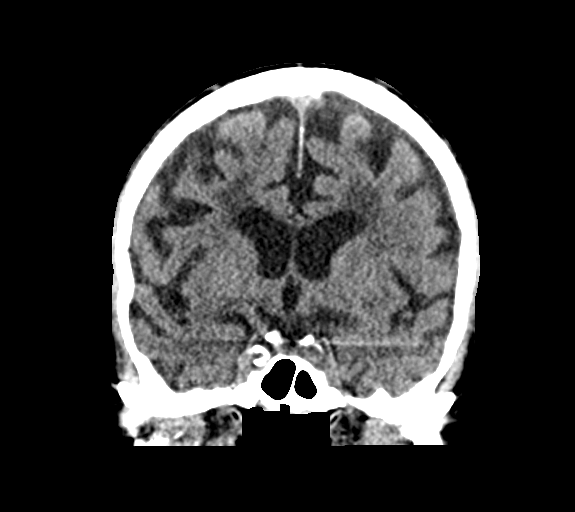

[Series 6: sagittal soft · sagittal · 0.36mm/px · 3 of 67 slices shown]
[im 23/67  brain]
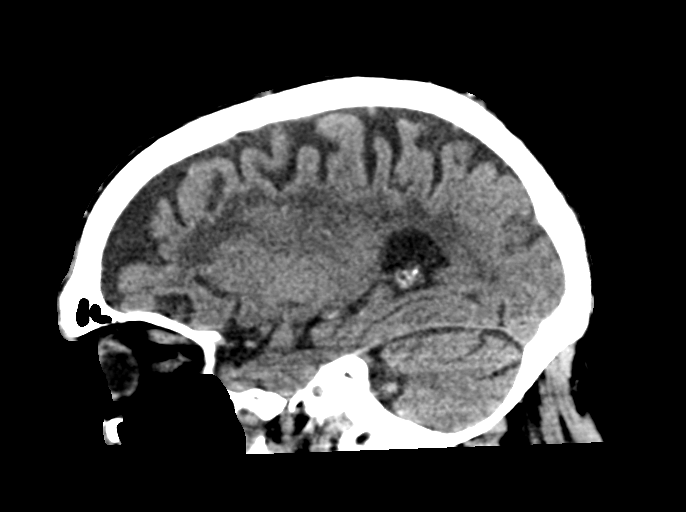
[im 34/67  brain]
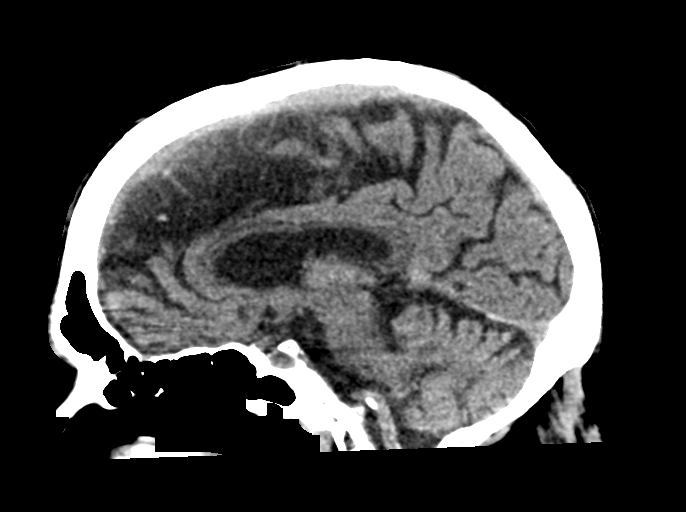
[im 45/67  brain]
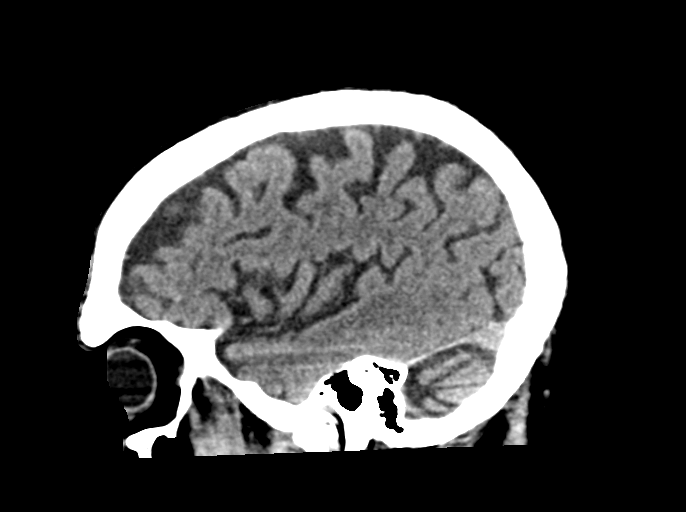

[14 of 47 positions shown; findings below may reference images not displayed]

FINDINGS: CT HEAD FINDINGS

Brain: No acute intracranial hemorrhage. No focal mass lesion. No CT
evidence of acute infarction. No midline shift or mass effect. No
hydrocephalus. Basilar cisterns are patent.

There are periventricular and subcortical white matter
hypodensities. Generalized cortical atrophy.

Vascular: No hyperdense vessel or unexpected calcification.

Skull: Normal. Negative for fracture or focal lesion.

Sinuses/Orbits: Paranasal sinuses and mastoid air cells are clear.
Orbits are clear.

Other: None.

CT MAXILLOFACIAL FINDINGS

Osseous: No fracture or mandibular dislocation. No destructive
process.

Orbits: Negative. No traumatic or inflammatory finding.

Sinuses: Clear.

Soft tissues: Negative.

CT CERVICAL SPINE FINDINGS

Alignment: Normal alignment of the cervical vertebral bodies.

Skull base and vertebrae: Normal craniocervical junction. No loss of
vertebral body height or disc height. Normal facet articulation. No
evidence of fracture.

Soft tissues and spinal canal: No prevertebral soft tissue swelling.
No perispinal or epidural hematoma.

Disc levels:  Bulky osteophytes from C4 to C7.

Upper chest: Clear

Other: None
IMPRESSION: 1. No intracranial trauma.
2. Atrophy and white matter microvascular disease.
3. No facial bone fracture.
4. No cervical spine fracture.
5. Multilevel disc osteophytic disease C4-C7
